# Patient Record
Sex: Male | Born: 1950
Health system: Southern US, Community
[De-identification: ages and names within clinical notes are randomized; demographics above are authoritative.]

## PROBLEM LIST (undated history)

## (undated) DIAGNOSIS — I472 Ventricular tachycardia, unspecified: Secondary | ICD-10-CM

## (undated) DIAGNOSIS — I2510 Atherosclerotic heart disease of native coronary artery without angina pectoris: Secondary | ICD-10-CM

## (undated) DIAGNOSIS — I4892 Unspecified atrial flutter: Secondary | ICD-10-CM

## (undated) DIAGNOSIS — I219 Acute myocardial infarction, unspecified: Secondary | ICD-10-CM

## (undated) DIAGNOSIS — I10 Essential (primary) hypertension: Secondary | ICD-10-CM

## (undated) DIAGNOSIS — R0602 Shortness of breath: Secondary | ICD-10-CM

## (undated) DIAGNOSIS — R911 Solitary pulmonary nodule: Secondary | ICD-10-CM

## (undated) DIAGNOSIS — K219 Gastro-esophageal reflux disease without esophagitis: Secondary | ICD-10-CM

## (undated) DIAGNOSIS — Z9289 Personal history of other medical treatment: Secondary | ICD-10-CM

## (undated) DIAGNOSIS — M21372 Foot drop, left foot: Secondary | ICD-10-CM

## (undated) DIAGNOSIS — I82622 Acute embolism and thrombosis of deep veins of left upper extremity: Secondary | ICD-10-CM

## (undated) DIAGNOSIS — I255 Ischemic cardiomyopathy: Secondary | ICD-10-CM

## (undated) DIAGNOSIS — M869 Osteomyelitis, unspecified: Secondary | ICD-10-CM

## (undated) DIAGNOSIS — G4730 Sleep apnea, unspecified: Secondary | ICD-10-CM

## (undated) DIAGNOSIS — E785 Hyperlipidemia, unspecified: Secondary | ICD-10-CM

## (undated) HISTORY — DX: Solitary pulmonary nodule: R91.1

## (undated) HISTORY — DX: Acute embolism and thrombosis of deep veins of left upper extremity: I82.622

## (undated) HISTORY — DX: Unspecified atrial flutter: I48.92

## (undated) HISTORY — DX: Hyperlipidemia, unspecified: E78.5

## (undated) HISTORY — DX: Osteomyelitis, unspecified: M86.9

## (undated) HISTORY — DX: Ischemic cardiomyopathy: I25.5

## (undated) HISTORY — DX: Foot drop, left foot: M21.372

## (undated) HISTORY — DX: Gastro-esophageal reflux disease without esophagitis: K21.9

## (undated) HISTORY — DX: Personal history of other medical treatment: Z92.89

## (undated) HISTORY — DX: Ventricular tachycardia, unspecified: I47.20

---

## 1983-06-20 HISTORY — PX: BACK SURGERY: SHX140

## 1994-03-19 HISTORY — PX: CORONARY ANGIOPLASTY WITH STENT PLACEMENT: SHX49

## 1994-10-18 HISTORY — PX: CORONARY ANGIOPLASTY WITH STENT PLACEMENT: SHX49

## 1998-09-15 ENCOUNTER — Inpatient Hospital Stay (HOSPITAL_COMMUNITY): Admission: EM | Admit: 1998-09-15 | Discharge: 1998-09-25 | Payer: Self-pay | Admitting: Emergency Medicine

## 1998-09-18 HISTORY — PX: CORONARY ANGIOPLASTY: SHX604

## 1999-05-22 ENCOUNTER — Inpatient Hospital Stay (HOSPITAL_COMMUNITY): Admission: EM | Admit: 1999-05-22 | Discharge: 1999-05-24 | Payer: Self-pay | Admitting: Emergency Medicine

## 1999-05-22 ENCOUNTER — Encounter: Payer: Self-pay | Admitting: Cardiology

## 2000-01-18 HISTORY — PX: CARDIAC CATHETERIZATION: SHX172

## 2000-02-08 ENCOUNTER — Encounter: Payer: Self-pay | Admitting: *Deleted

## 2000-02-09 ENCOUNTER — Inpatient Hospital Stay (HOSPITAL_COMMUNITY): Admission: RE | Admit: 2000-02-09 | Discharge: 2000-02-10 | Payer: Self-pay | Admitting: *Deleted

## 2002-05-01 ENCOUNTER — Other Ambulatory Visit: Admission: RE | Admit: 2002-05-01 | Discharge: 2002-05-02 | Payer: Self-pay | Admitting: Family Medicine

## 2002-12-18 HISTORY — PX: CORONARY ANGIOPLASTY WITH STENT PLACEMENT: SHX49

## 2003-01-06 ENCOUNTER — Encounter: Payer: Self-pay | Admitting: Cardiovascular Disease

## 2003-01-07 ENCOUNTER — Inpatient Hospital Stay (HOSPITAL_COMMUNITY): Admission: RE | Admit: 2003-01-07 | Discharge: 2003-01-13 | Payer: Self-pay | Admitting: Cardiovascular Disease

## 2003-01-27 ENCOUNTER — Encounter (HOSPITAL_COMMUNITY): Admission: RE | Admit: 2003-01-27 | Discharge: 2003-02-27 | Payer: Self-pay | Admitting: Cardiovascular Disease

## 2003-03-02 ENCOUNTER — Encounter (HOSPITAL_COMMUNITY): Admission: RE | Admit: 2003-03-02 | Discharge: 2003-04-01 | Payer: Self-pay | Admitting: Cardiovascular Disease

## 2003-10-06 ENCOUNTER — Encounter: Admission: RE | Admit: 2003-10-06 | Discharge: 2003-10-06 | Payer: Self-pay | Admitting: General Surgery

## 2003-10-07 ENCOUNTER — Ambulatory Visit (HOSPITAL_BASED_OUTPATIENT_CLINIC_OR_DEPARTMENT_OTHER): Admission: RE | Admit: 2003-10-07 | Discharge: 2003-10-07 | Payer: Self-pay | Admitting: General Surgery

## 2003-10-07 ENCOUNTER — Ambulatory Visit (HOSPITAL_COMMUNITY): Admission: RE | Admit: 2003-10-07 | Discharge: 2003-10-07 | Payer: Self-pay | Admitting: General Surgery

## 2003-10-08 ENCOUNTER — Encounter (INDEPENDENT_AMBULATORY_CARE_PROVIDER_SITE_OTHER): Payer: Self-pay | Admitting: Specialist

## 2004-01-15 ENCOUNTER — Ambulatory Visit (HOSPITAL_COMMUNITY): Admission: RE | Admit: 2004-01-15 | Discharge: 2004-01-15 | Payer: Self-pay | Admitting: Cardiovascular Disease

## 2004-05-16 ENCOUNTER — Ambulatory Visit (HOSPITAL_COMMUNITY): Admission: RE | Admit: 2004-05-16 | Discharge: 2004-05-17 | Payer: Self-pay | Admitting: Cardiovascular Disease

## 2005-01-17 ENCOUNTER — Ambulatory Visit (HOSPITAL_COMMUNITY): Admission: RE | Admit: 2005-01-17 | Discharge: 2005-01-18 | Payer: Self-pay | Admitting: Cardiovascular Disease

## 2005-01-17 HISTORY — PX: CORONARY ANGIOPLASTY WITH STENT PLACEMENT: SHX49

## 2006-06-18 ENCOUNTER — Inpatient Hospital Stay (HOSPITAL_COMMUNITY): Admission: EM | Admit: 2006-06-18 | Discharge: 2006-06-20 | Payer: Self-pay | Admitting: Emergency Medicine

## 2006-06-19 HISTORY — PX: CARDIAC CATHETERIZATION: SHX172

## 2007-01-10 ENCOUNTER — Encounter (INDEPENDENT_AMBULATORY_CARE_PROVIDER_SITE_OTHER): Payer: Self-pay | Admitting: *Deleted

## 2007-01-10 ENCOUNTER — Inpatient Hospital Stay (HOSPITAL_COMMUNITY): Admission: EM | Admit: 2007-01-10 | Discharge: 2007-01-11 | Payer: Self-pay | Admitting: *Deleted

## 2008-06-19 HISTORY — PX: CARDIAC CATHETERIZATION: SHX172

## 2008-11-17 HISTORY — PX: CORONARY ANGIOPLASTY WITH STENT PLACEMENT: SHX49

## 2008-11-26 ENCOUNTER — Encounter: Admission: RE | Admit: 2008-11-26 | Discharge: 2008-11-26 | Payer: Self-pay | Admitting: Cardiovascular Disease

## 2008-11-27 ENCOUNTER — Observation Stay (HOSPITAL_COMMUNITY): Admission: AD | Admit: 2008-11-27 | Discharge: 2008-11-28 | Payer: Self-pay | Admitting: Cardiology

## 2009-12-14 ENCOUNTER — Encounter: Admission: RE | Admit: 2009-12-14 | Discharge: 2009-12-15 | Payer: Self-pay | Admitting: Family Medicine

## 2010-07-10 ENCOUNTER — Encounter: Payer: Self-pay | Admitting: Cardiovascular Disease

## 2010-07-29 HISTORY — PX: TRANSTHORACIC ECHOCARDIOGRAM: SHX275

## 2010-09-26 LAB — BASIC METABOLIC PANEL
BUN: 13 mg/dL (ref 6–23)
Chloride: 105 mEq/L (ref 96–112)
Creatinine, Ser: 1.08 mg/dL (ref 0.4–1.5)
Glucose, Bld: 120 mg/dL — ABNORMAL HIGH (ref 70–99)
Sodium: 141 mEq/L (ref 135–145)

## 2010-09-26 LAB — LIPID PANEL
Cholesterol: 89 mg/dL (ref 0–200)
LDL Cholesterol: 56 mg/dL (ref 0–99)
VLDL: 15 mg/dL (ref 0–40)

## 2010-09-26 LAB — CBC: Platelets: 169 10*3/uL (ref 150–400)

## 2010-09-26 LAB — CARDIAC PANEL(CRET KIN+CKTOT+MB+TROPI)
Relative Index: 5.8 — ABNORMAL HIGH (ref 0.0–2.5)
Total CK: 164 U/L (ref 7–232)
Troponin I: 0.02 ng/mL (ref 0.00–0.06)

## 2010-11-01 NOTE — Discharge Summary (Signed)
John [name], John [name] NO.:  [id number]   MEDICAL RECORD NO.:  [id number]          PATIENT TYPE:  INP   LOCATION:  2502                         FACILITY:  [facility]   PHYSICIAN:  Cristy Hilts. Jacinto Halim, MD       DATE OF BIRTH:  March 22, 1951   DATE OF ADMISSION:  11/27/2008  DATE OF DISCHARGE:  11/28/2008                               DISCHARGE SUMMARY   DISCHARGE DIAGNOSES:  1. The patient has had increasing angina.  2. High risk Cardiolite study done, November 24, 2008.  3. Coronary artery disease with new driver to the mid right coronary      artery, distal to previously placed Cypher stent.  4. Previous proximal right coronary artery stent with Cypher stent in      2006, another stent mid RCA, as well as stent to the circumflex.  5. Left ventricular dysfunction, EF 40-45%.  6. Tobacco abuse.  7. History of subacute stent thrombosis x2 in the past.   DISCHARGE CONDITION:  Improved.   DISCHARGE MEDICATIONS:  1. Norvasc 5 mg daily.  2. Toprol-XL 50 mg daily.  3. Benazepril 20 mg daily.  4. Enteric-coated aspirin new dose of 325 mg daily.  5. Singulair 10 mg daily.  6. Crestor 20 mg daily.  7. Zetia 10 mg daily.  8. BuSpar 10 mg daily.  9. Nexium 40 mg twice a day.  10.Nitroglycerin 0.4 mg sublingual as needed.  11.Effient 10 mg one daily to replace the Plavix for 4 weeks.  12.No Plavix.   DISCHARGE INSTRUCTIONS:  1. May return to work, December 02, 2008.  2. Wash cath site with soap and water.  Call if any bleeding,      swelling, or drainage.  3. Increase activity slowly.  4. May shower.  No lifting for 3 days.  No driving for 3 days.  5. Follow up with Dr. Alanda Amass in 2 weeks, office will call with date      and time.   HISTORY OF PRESENT ILLNESS:  A 60 year old white married male with  tobacco history and history of coronary disease with history of stents  to the RCA and the circumflex in the past, begin having increasing  episodes of chest pain, underwent Cardiolite  study, November 24, 2008, which  was interpreted as high risk.  He was arranged for a cardiac  catheterization and possible intervention, which she did undergo as  elective procedure at [facility], November 27, 2008.  A new stent was placed, a  driver stent.   The patient was placed in the 2500 units post procedure, tolerated it  the night without problems.  The next morning, was stable.  Hemoglobin  14.9, hematocrit 43.5, platelets 167, and WBC 7.5.  Potassium 3.8,  creatinine 1.08.   PHYSICAL EXAMINATION:  VITAL SIGNS: Blood pressure 113/72, pulse 59,  temperature 97.8.  CK-MB and troponin were normal.  HEART:  Regular rate and rhythm.  LUNGS:  Clear.  Groin cath site without hematoma.   He was ambulated with cardiac rehab and did well and was discharged  home.  He will follow up as  an outpatient with Dr. Alanda Amass.      Darcella Gasman. [place], N.P.      Cristy Hilts. Jacinto Halim, MD     LRI/MEDQ  D:  11/28/2008  T:  11/29/2008  Job:  [id number]   cc:   Gerlene Burdock A. Alanda Amass, M.D.  Ernestina Penna, M.D.  [facility]

## 2010-11-01 NOTE — Discharge Summary (Signed)
NAMECARLOS, HEBER NO.:  [id number]   MEDICAL RECORD NO.:  [id number]          PATIENT TYPE:  INP   LOCATION:  2014                         FACILITY:  [facility]   PHYSICIAN:  Marcellus Scott, MD     DATE OF BIRTH:  1951/03/06   DATE OF ADMISSION:  01/10/2007  DATE OF DISCHARGE:  01/11/2007                               DISCHARGE SUMMARY   PRIMARY MEDICAL DOCTOR:  Dr. Vernon Prey of [facility].   CARDIOLOGIST:  Dr. Susa Griffins of [facility].   DISCHARGE DIAGNOSES:  1. Atypical chest pain, myocardial infarction ruled out.  2. Ischemic cardiomyopathy.  3. Hypertension.  4. Bilateral Pulmonary nodules.  5. Right upper pole 2 cm renal cyst.  6. Tobacco abuse.  7. Dyslipidemia.  8. Gastroesophageal reflux disease.  9. Fatty liver.   DISCHARGE MEDICATIONS:  Essentially the same medications that the  patient was taking at home and Imdur 30 mg p.o. daily was newly added:  1. Norvasc 5 mg p.o. daily.  2. Toprol-XL 50 mg p.o. daily.  3. Lotensin 20 mg p.o. daily.  4. Enteric-coated aspirin 81 mg p.o. daily.  5. Singulair 10mg  p.o. daily.  6. Plavix 75 mg p.o. daily.  7. Crestor 20 mg p.o. daily.  8. Nexium 40 mg p.o. daily.  9. Zetia 10 mg p.o. daily.  10.BuSpar 10 mg p.o. daily.  11.Imdur 30 mg p.o. daily.  12.Tylenol 650 mg p.o. q.4-6 h. p.r.n.   PROCEDURES:  1. Portable chest x-ray on July 23.  Impression:  Stable pulmonary      vascular congestion without edema.  Question interval cardiac      enlargement versus artifact.  2. Ultrasound of the abdomen.  Impression:  (a) Fatty infiltration,      (b) right upper pole 2-cm renal cyst, (c) normal gallbladder, (d)      no acute findings by ultrasound.  3. CT angiogram of the chest on July 23.  Impression:  No evidence of      pulmonary embolism.  Mild bibasilar atelectasis.  Small hiatal      hernia.  Nonspecific bilaterally pulmonary nodules.  Does the      patient  have a history of malignancy?  Small size of these lesions      make it difficult to assess by PET/CT.  Recommend short-term      followup CT of chest in 4-6 months to determine stability.   PERTINENT LABORATORY RESULTS:  Basic metabolic panel normal with a BUN  of 9 and creatinine of 0.98.  CBC is normal with hemoglobin 15.5,  hematocrit 45.6.  Troponin x3 negative.  CK is mildly elevated at 117,  118, 134 in descending order; CK-MB 8, 7.9, 6.6 in descending order;  relative index 6.8, 6.7, 4.9 in that order.  A1c of 6.3.  Lipids of  cholesterol 84, triglycerides 73, HDL 28, LDL 41, VLDL of 15, lipase 37.  Hepatic panel normal.   CONSULTATIONS:  Cardiology from [facility], Dr.  Tresa Endo.   HOSPITAL COURSE AND PATIENT DISPOSITION:  For details of the initial  part of the admission, please refer to the history and physical note  done by Dr. Roxan Hockey.  In summary, Mr. Arney is a 60 year old male  with extensive past medical history of coronary artery disease, status  post stents with recent cardiac catheterization revealing patent stents.  He presented with a 1-week history of pain across the lower chest and  back.  He was therefore admitted to rule out an acute coronary syndrome.  The patient was admitted to a telemetry bed.  His cardiac enzymes were  cycled and not indicative of an MI. He was initially placed on  nitroglycerin drip and continued on his home cardiac medications.  Although his chest pain was atypical, given his previous extensive  cardiac history Cardiology was consulted.  They did not think repeat  cath was warranted.  His nitroglycerin drip was stopped and he was  switched to oral Imdur.  With these measures, the patient has done quite  well with resolution of his chest pain.  The patient has been cleared by  Cardiology for discharge on medications as indicated above and to follow  up with them as an outpatient for an outpatient stress test.  The   patient has been instructed to seek immediate medical attention for any  deterioration in his condition.  Incidental findings of pulmonary  nodules, renal cysts and fatty liver to be followed and worked up as an  outpatient as deemed necessary.  The patient has been counseled  regarding tobacco cessation.      Marcellus Scott, MD  Electronically Signed     AH/MEDQ  D:  01/11/2007  T:  01/11/2007  Job:  [id number]   cc:   Ernestina Penna, M.D.  Richard A. Alanda Amass, M.D.  Nicki Guadalajara, M.D.

## 2010-11-01 NOTE — H&P (Signed)
John [name], John [name] NO.:  [id number]   MEDICAL RECORD NO.:  [id number]          PATIENT TYPE:  INP   LOCATION:  1823                         FACILITY:  [facility]   PHYSICIAN:  Michaelyn Barter, M.D. DATE OF BIRTH:  [birth year]   DATE OF ADMISSION:  01/09/2007  DATE OF DISCHARGE:                              HISTORY & PHYSICAL   CARDIOLOGIST:  Gerlene Burdock A. Alanda Amass, M.D.   CHIEF COMPLAINT:  Lower chest and back pain.   HISTORY OF PRESENT ILLNESS:  Mr. Leece is a 60 year old gentleman  with a past medical history of coronary artery disease and stent  placement who states that for at least one week, he had been  experiencing different types of pain.  His lower chest,  upper abdomen,  and his back have been causing him pain.  He is very vague with regards  to whether or not his upper abdominal area versus his lower chest area  bothers him the most.  He states that he has been having some lower  chest pain on a daily basis for the past week.  It reaches 8/10 in peak  intensity.  There are no aggravating or alleviating factors.  He also  has had some nausea but no vomiting.  He is experiencing chills but no  fever.  He states that he has never had similar type of lower  chest/abdominal pain before.  His back pain is also described as  constant, sharp, in the upper region of his back just below the shoulder  blades.  He indicates that he took sublingual nitroglycerin yesterday  morning. This provided some symptom relief.  He thought that his heart  may have been involved with regards to his current symptoms.  He denies  having any history of back injury.  He called his cardiologist's, Dr.  Alanda Amass, office earlier in the day and spoke with one of the nurses.  She indicated that Dr. Tresa Endo had told the patient that his symptoms  sounded classic for a heart related pain.  He was, therefore, referred  to the hospital for further evaluation.  Currently the patient is  attached to IV nitroglycerin and he indicates that he is completely pain  free.   PAST MEDICAL HISTORY:  1. Coronary artery disease.  The patient underwent a cardiac      catheterization on June 20, 2006.  It appears the patient has      stents present that travel from the most proximal portion, but do      not appear to cover the ostium and they extend all the way down      past the PDA.  During the cardiac catheterization, it was revealed      that all the stents were widely patent with only mild irregularity.      The patient also was discovered to have had ischemic myopathy with      a 40-45% EF with inferior posterior and basilar wall motion      abnormality.  2. Hypertension.  3. Dyslipidemia.  4. History of anxiety.  5. GERD.  6. Unstable angina.  7.  Myocardial infarction, September 15, 1998.  It appears, according to      the E-chart, that the patient may have had at least three Mis in      the past and has had multiple PTCAs with stentings.   ALLERGIES:  No known drug allergies.   HOME MEDICATIONS:  1. Benazepril 20 mg p.o. daily.  2. BuSpar 10 mg daily.  3. Crestor.  4. Metoprolol tartrate 25 mg daily.  5. Nexium 40 mg daily.  6. Plavix 75 mg daily.  7. Singulair 10 mg daily.  8. Zetia 10 mg daily.  9. Norvasc 5 mg daily.  10.Sublingual nitroglycerin.   SOCIAL HISTORY:  Cigarettes.  The patient indicates that he has been  smoking since the age of 18.  He still currently smokes one pack of  cigarettes a day.  Alcohol.  The patient stopped approximately 20 years  ago.  Initially the patient stated that he stopped 20 years ago.  He  later stated that he still occasionally a beer.   FAMILY HISTORY:  Mother died from bone cancer.  Father died from MI at  the age of 9.   REVIEW OF SYSTEMS:  As per HPI.   PHYSICAL EXAMINATION:  VITAL SIGNS: Temperature 98.2, blood pressure  132/83, heart rate 73, respirations 18, oxygen saturation 97% on room  air.  HEENT:   Atraumatic, normocephalic.  Anicteric.  Extraocular movements  are intact.  Oral mucosa is pink.  No thrush. No exudates.  NECK:  No JVD.  Supple.  No lymphadenopathy.  No thyromegaly.  CARDIAC: S1 and S2 present.  Regular rate and rhythm.  ABDOMEN:  Flat. Soft. Nondistended, nontender.  Positive bowel sounds in  all four quadrants.  No masses.  EXTREMITIES:  No leg edema.  NEUROLOGIC:  The patient is alert and oriented x3.  MUSCULOSKELETAL:  Bilateral 5/5 upper and lower extremity strength.   CT scan of the chest reveals nonspecific bilateral pulmonary nodules.  It is recommended by the radiologist that the patient have a followup CT  scan within the next four to six months.  Ultrasound of the abdomen  reveals no acute findings.  Fatty liver is noted.   LABORATORY DATA:  CK-MB, TLC 3.3, troponin-I TLC less than 0.05.  WBC  10.9, hemoglobin 14.8, hematocrit 44.5, platelets 206,000.  Lipase 37,  bilirubin total 0.6, bilirubin direct 0.1, indirect bilirubin 0.5. Alk  phos 85, SGOT 21, SGPT 27, total protein 6.9, albumin 3.6.  PT is 13,  INR 1.  CK-MB TLC 5.1, troponin-I TLC less than 0.05.  Creatinine 1.  ABG: PH 7.347, pCO2 50.4, bicarb 27.7, hemoglobin 16.7, hematocrit 49.  Sodium 143, potassium 3.8, chloride 106, glucose 95, BUN 9.  The EKG  done January 09, 2007 was similar to that done June 20, 2006.  The only  difference is it looks like the patient may have a PVC that is evident  in leads V2 and V3.  Otherwise EKGs have a very similar appearance.   ASSESSMENT/PLAN:  1. Lower chest/upper abdominal pain.  The etiology of this is      questionable.  Given the patient's history of coronary artery      disease and multiple myocardial infarctions, as well as him      indicating that he had derived significant amount of relief from      nitroglycerin, one has to be concerned about the possibility of      cardiac factor playing a role.  Will, therefore, rule  the patient      out for  cardiac event via following his cardiac markers x3 eight      hours apart.  IV nitroglycerin has been initiated in the emergency      department.  Will continue this at a low dose.  Will provide p.r.n.      morphine and aspirin.  Will also consider consultation with      cardiology in the morning.  2. Hypertension.  This is stable.  3. Back pain.  The relationship to the patient's upper abdomen and      chest pain is questionable.  The patient indicated that he did have      an MRI completed earlier in the day, the results of which are not      currently available.  Will, therefore, have to follow this up.  4. Questionable lung nodules on CT scan.  Will bring this to the      patient's attention to assure that he does have a repeat followup      CT scan done in four to six months.  5. Continued cigarette smoking.  Will recommend smoking cessation.  6. History of dyslipidemia.  Will resume the patient's prior      hyperlipidemia medication.  7. History of coronary artery disease with multiple stents, status      post myocardial infarction.  Will consider consultation with      cardiology.  8. GI prophylaxis with Protonix.  DVT prophylaxis with Lovenox.      Michaelyn Barter, M.D.  Electronically Signed     OR/MEDQ  D:  01/10/2007  T:  01/10/2007  Job:  [id number]

## 2010-11-01 NOTE — Cardiovascular Report (Signed)
NAMESOU, NOHR NO.:  [id number]   MEDICAL RECORD NO.:  [id number]          PATIENT TYPE:  INP   LOCATION:  2502                         FACILITY:  [facility]   PHYSICIAN:  Cristy Hilts. Jacinto Halim, MD       DATE OF BIRTH:  June 27, 1950   DATE OF PROCEDURE:  11/27/2008  DATE OF DISCHARGE:                            CARDIAC CATHETERIZATION   PROCEDURE PERFORMED:  1. Left ventriculography.  2. Selective right and left coronary arteriography.  3. PTCA and direct stenting of the mid RCA for a 80% stenosis just      distal to the previously placed stent.  4. Left femoral arteriography and left femoral arterial access with      StarClose.   INDICATION:  John [name] is a very complex 60 year old gentleman with  known coronary artery disease, obesity, hypertension, hyperlipidemia,  who has had extensive angioplasties and multiple stents placed in his  mid and also distal right coronary artery, which is a dominant vessel.  He has been having angina pectoris.  It will be the high clinical  suspicion for in-stent restenosis.  He was referred for cardiac  catheterization.   HEMODYNAMIC DATA:  The left ventricular pressure was 132/60 with end-  diastolic pressure of 22 mmHg.  Aortic pressure was 125/78 with a mean  of 97 mmHg.  There is no significant pressure gradient across the aortic  valve.   ANGIOGRAPHIC DATA:  Left ventricle:  The left ventricular systolic  function was mild to moderately depressed with ejection fraction of 40-  45% with inferolateral akinesis.   Right coronary artery:  Right coronary artery is a large-caliber vessel  and a dominant vessel.  The previously placed stent in the proximal to  mid segment and also the distal segment patent with mild 20-30% luminal  irregularity.  Just distal to the previously placed 2 drug-eluting  stent, there is a napkin ring like 80% stenoses.  Distal right coronary  artery stents that extends into the PLV branch are patent  with mild 20-  30% luminal irregularity.   The right coronary artery has a 3.5 x 13 and a 3.5 x 18-mm Cypher stents  placed in the proximal and mid segment placed in 2006 and distal PDA  branches have multiple stents.  The sizes of which I am not sure at this  time.   Left main:  Left main coronary artery is a moderate-caliber vessel with  mild calcification.   Circumflex:  Circumflex coronary artery is continues as large obtuse  marginal I branch.  There is 30% stenosis in the mid segment.  There is  questionable stent in the mid segment of the obtuse marginal.  However,  there is mild luminal irregularity.   LAD:  LAD is a large-caliber vessel into the proximal segment with mild  diffuse luminal irregularity and gives origin to large diagonal I.   INTERVENTION DATA:  Successful PTCA and direct stenting of the mid RCA  with implantation of a 4.0 x 12-mm driver stent, which was deployed at  10 atmospheric pressure.  Gently, it was pulled back inside of  the stent  struts and a 14-16 atmospheric pressure balloon inflation was performed  for about 40 seconds.  Having performed this, 0% residual waste was  noted.  Post balloon angioplasty angiography with excellent result with  brisk TIMI III flow.   RECOMMENDATIONS:  The patient has had subacute stent thrombosis x2 in  the past.  He was actually bridged with Ticlid thinking that he probably  has Plavix resistance and eventually switched over to Plavix once his  acute phase was done.  Hence, we will start him on Effient for at least  a period of 4 weeks then he can be switched back to Plavix.  A total of  135 mL of contrast was utilized for diagnostic and interventional  procedure.   TECHNIQUE OF THE PROCEDURE:  Usual sterile precautions using a 6-French  left femoral arterial access, a 6-French multipurpose B2 catheter was  advanced into ascending aorta and then into the left ventricle.  Left  ventriculography was performed both in  LAO and RAO projection.  Catheter  pulled into the ascending aorta.  Right coronary was selective engaged,  angiography was performed.  Then, left main coronary artery was  selectively engaged and angiography was performed.  Then, the catheter  was pulled out of the body.   INTERVENTION DATA:  Successful PTCA and direct stenting of the mid RCA  with the implantation of a 4.0 x 12-mm driver, nondrug-eluting stent,  which was deployed at high pressure.  This was performed using heparin  for anticoagulation and maintaining ACT greater than 200.  The ACT was 254 at stent deployment.  Direct stenting was performed  after using a 6-French JR-4 guide catheter, which was utilized in the  right coronary artery and Asahi Prowater guidewire was utilized to cross  the right coronary artery and direct stenting was performed.  The same  stent balloon was utilized to perform post dilatation at high pressure  keeping it within the stent struts.  Having performed this, excellent  angiographic results were obtained.  The guidewire was withdrawn.  Angiography repeated.  Guide catheter disengaged and pulled out of body.   Left femoral arteriography was performed through the arterial access.  Sheath and the access closed with StarClose with excellent hemostasis.   The patient tolerated the procedure well.  No immediate complication  noted.      Cristy Hilts. Jacinto Halim, MD  Electronically Signed     JRG/MEDQ  D:  11/27/2008  T:  11/28/2008  Job:  [id number]   cc:   Gerlene Burdock A. Alanda Amass, M.D.  Ernestina Penna, M.D.

## 2010-11-04 NOTE — Consult Note (Signed)
John [name], John [name] [name] NO.:  [id number]   MEDICAL RECORD NO.:  [id number]          PATIENT TYPE:  OIB   LOCATION:  6524                         FACILITY:  [facility]   PHYSICIAN:  Griffith Citron, M.D.DATE OF BIRTH:  Jul 24, 1950   DATE OF CONSULTATION:  05/16/2004  DATE OF DISCHARGE:                                   CONSULTATION   REFERRING PHYSICIAN:  Richard A. Alanda Amass, M.D.   REASON FOR CONSULTATION:  John [name] John [name] [name] is a 60 year old white male who with  known coronary artery disease, having had multiple percutaneous cardiology  interventions, readmitted today following cardiac catheterization.  His  current admission was precipitated by an episode of chest pain which he  experienced one week ago.  He took nitroglycerin with prompt relief.  His  pain was left precordial radiating to the left shoulder.  He feels that he  can distinguish this from his GI symptoms.  On angiogram today there was no  significant progression of his coronary artery disease, nor was there  significant stenosis of the multiple stents which are in situ.   From a GI standpoint, the patient has had long-standing symptoms over the  past 20 to 30 years.  He initially used H2 blockers and over the past  several years had been switched to PPI therapy, including Protonix,  Prevacid, Prilosec, and Nexium.  His current regimen is Nexium 40 mg p.o.  daily.  Despite this, he has almost daily episodes of pyrosis which he  describes in quite classic terms of burning discomfort arising from the  epigastrium into the chest and base of the throat.  It is exacerbated  typically postprandially and when laying down.  Accompanied by night-time  regurgitation, at times awakening with choking and coughing fits.  He denies  odynophagia, dysphagia, or hematemesis.  He does have dyspeptic symptoms  with epigastric pressure-type discomfort, abdominal bloating, belching, and  a vague pressure-type discomfort  radiating across the upper abdomen.  This  worsens if he misses a meal.   The patient has not undergone prior endoscopy.  He did have an abdominal  ultrasound this past summer to rule out gallbladder disease at [facility], reportedly negative.  He has gained 15 pounds over the past three  months, but he does not feel that this has exacerbated his GI symptoms in  any way.  His risk factors are minimal, taking one aspirin 325 mg tablet  daily.  No use of NSAIDS or ulcerogenic over-the-counter medications.  He  smokes minimally and is currently trying to stop.  He drinks rarely.  There  is a significant family history, a father with peptic disease.   PAST MEDICAL HISTORY:  1.  Chronic obstructive airway disease/chronic tobacco abuse.  2.  Coronary artery disease.  3.  Hypertension.  4.  Hyperlipidemia.  5.  Panic attacks.  6.  Status post lumbosacral spine surgery in 1985.   CURRENT MEDICATIONS:  1.  Norvasc 5 mg daily.  2.  Toprol XL 50 mg daily.  3.  Lotensin 20 mg daily.  4.  Aspirin 325 mg daily.  5.  Plavix daily.  6.  Crestor 20 mg daily.  7.  Nexium 40 mg daily.   ALLERGIES:  NIASPAN.   SOCIAL HISTORY:  Father of three children.  Smokes approximately five  cigarettes per day.  Alcohol is rare beer usually during the summer.  Employment:  Civil Service fast streamer for [employer].  Job entails traveling a  minimum of three days a week.   FAMILY HISTORY:  Significant for father with peptic disease.  No family  history of gallbladder disease or colorectal neoplasia.   REVIEW OF SYSTEMS:  Per HPI.  Otherwise negative.   PHYSICAL EXAMINATION:  GENERAL:  A healthy-appearing white male resting  comfortably in bed.  Alert and oriented x3.  _________affect.  Normal mood.  Excellent historian.  HEENT:  Anicteric sclerae, pink conjunctivae.  EOMI.  PERRL.  No  oropharyngeal lesions without abnormality of the tongue, lips, or gums.  CHEST:  Clear to auscultation with  slightly decreased breath sounds  throughout.  No adventitious sounds.  CARDIAC:  Regular rhythm.  No gallop, no murmur, no heave, no lift.  ABDOMEN:  Overweight, soft, nontender, nondistended, bowel sounds are  present throughout.  No borborygmi, no bruit, or splash.  No caput, venous  prominence, or fluid wave detected.  No firmness, no mass.  RECTAL:  Not performed.  EXTREMITIES:  No cyanosis, clubbing, or edema.  Distal radial pulses full  and bilaterally symmetric.  NEUROLOGIC:  Without focal deficit.   IMPRESSION:  1.  Gastroesophageal reflux disease.  His symptoms are long-standing and      rather classic in character.  The only atypical feature is the failure      of these symptoms to respond to acid-suppressive therapy either with H2      blockers or proton pump inhibitors.  Because of the difficulty in      discerning the etiology of his current episode of chest pain, and      because of the refractory nature of his reflux symptoms, endoscopy is      clearly warranted to further evaluate possible peptic etiology.  2.  Dyspepsia.  Separate from the patient's reflux symptoms he experiences      epigastric pressure-type discomfort, associated belching, abdominal      bloating.  This occurs postprandially.  His only risk factors are one      aspirin tablet daily and a family history of a father with peptic ulcer      disease.  Endoscopy will help to sort out any upper gastrointestinal      pathology here as well.  3.  Coronary artery disease.  We will establish and closely follow.  No      evidence of significant disease progression.  The patient appears to be      able to discern cardiac from gastrointestinal etiology in his symptoms.  4.  Colorectal neoplasia surveillance.  Screening colonoscopy is yet to be      accomplished.  This will be recommended when the patient is stable on an      outpatient basis.   RECOMMENDATIONS: 1.  Abdominal ultrasound tomorrow morning.  2.   Panendoscopy at 3 p.m. tomorrow.  3.  Colorectal neoplasia surveillance with screening colonoscopy to be      scheduled on an outpatient basis.       JRM/MEDQ  D:  05/16/2004  T:  05/17/2004  Job:  [id number]   cc:   [facility]   Richard A. Alanda Amass, M.D.  [phone]  [address]  [place]  [place] [address]  Fax: [phone]

## 2010-11-04 NOTE — Op Note (Signed)
NAME:  GLENVILLE, ESPINA                        ACCOUNT NO.:  [id number]   MEDICAL RECORD NO.:  [id number]                   PATIENT TYPE:  AMB   LOCATION:  DSC                                  FACILITY:  [facility]   PHYSICIAN:  Leonie Man, M.D.                DATE OF BIRTH:  [birth year]   DATE OF PROCEDURE:  10/07/2003  DATE OF DISCHARGE:                                 OPERATIVE REPORT   PREOPERATIVE DIAGNOSIS:  Large sebaceous cyst of back.   POSTOPERATIVE DIAGNOSIS:  Large sebaceous cyst of back.   PROCEDURE:  Excision of sebaceous cyst of back.   SURGEON:  Leonie Man, M.D.   ASSISTANT:  Nurse.   ANESTHESIA:  MAC; I used 0.5% Marcaine with epinephrine 1:200,000.   INDICATIONS FOR PROCEDURE:  Note, the patient is a 60 year old man with an  enlarging sebaceous cyst of the back which he has asked to be removed  because it is causing some pain and discomfort at this point.  He is brought  to the operating room after the risks and potential benefits of surgery have  been discussed.  All questions have been answered and consent obtained.   DESCRIPTION OF PROCEDURE:  With the patient positioned in the left lateral  recumbent position, the cyst which was located just to the right of the  spinous processes in the mid thoracic area was prepped and draped to be  included in the sterile operative field.  I infiltrated around this area  with 0.5% Marcaine with epinephrine 1:200,000.  I then made an elliptical  incision over the mass and deepened this through the skin down to the  capsule of the mass carrying the dissection laterally until the entire  capsule could be visualized.  This was carried down both laterally up to the  right and to the left.  The sebaceous cyst was dissected free from the  surrounding subcutaneous tissues, removed in its entirety and forwarded for  pathologic evaluation.  The subcutaneous tissues were undermined for  approximately 2 cm on either side so as to  allow tension free skin closure.  Sponge, instrument and sharp counts were verified.  All areas of dissection  were checked for hemostasis and additional bleeding points treated with  electrocautery. The subcutaneous tissues were reapproximated with  interrupted 0 Vicryl sutures. The skin was closed with running 4-0 Monocryl  suture and then reinforced with Steri-Strips. A sterile dressing was applied  and the anesthetic was reversed.  The patient was removed from the operating  room to the recovery room in stable condition. He tolerated the procedure  well.                                               Leonie Man, M.D.    PB/MEDQ  D:  10/07/2003  T:  10/08/2003  Job:  [id number]

## 2011-04-03 LAB — CK TOTAL AND CKMB (NOT AT ARMC)
CK, MB: 6.6 — ABNORMAL HIGH
CK, MB: 7.9 — ABNORMAL HIGH
CK, MB: 8 — ABNORMAL HIGH
Relative Index: 4.9 — ABNORMAL HIGH
Relative Index: 6.7 — ABNORMAL HIGH
Relative Index: 6.8 — ABNORMAL HIGH
Total CK: 117

## 2011-04-03 LAB — HEPATIC FUNCTION PANEL
ALT: 27
Albumin: 3.6
Bilirubin, Direct: 0.1
Indirect Bilirubin: 0.5

## 2011-04-03 LAB — POCT I-STAT CREATININE
Creatinine, Ser: 1
Operator id: 285841

## 2011-04-03 LAB — I-STAT 8, (EC8 V) (CONVERTED LAB)
Glucose, Bld: 95
HCT: 49
Hemoglobin: 16.7
Operator id: 285841
Sodium: 143

## 2011-04-03 LAB — POCT CARDIAC MARKERS
CKMB, poc: 3.3
Myoglobin, poc: 173
Operator id: 285841
Operator id: 285841
Troponin i, poc: <0.05

## 2011-04-03 LAB — LIPID PANEL
Total CHOL/HDL Ratio: 3
VLDL: 15

## 2011-04-03 LAB — CBC
HCT: 44.5
HCT: 45.6
Hemoglobin: 14.8
MCHC: 34
Platelets: 203
RDW: 12.7
WBC: 10.3
WBC: 10.9 — ABNORMAL HIGH

## 2011-04-03 LAB — DIFFERENTIAL
Basophils Relative: 1
Eosinophils Absolute: 0.1
Monocytes Relative: 4
Neutro Abs: 8.6 — ABNORMAL HIGH
Neutrophils Relative %: 79 — ABNORMAL HIGH

## 2011-04-03 LAB — BASIC METABOLIC PANEL
BUN: 9
Calcium: 9.1
Chloride: 105
GFR calc non Af Amer: >60
Glucose, Bld: 97
Potassium: 3.8
Sodium: 141

## 2011-04-03 LAB — LIPASE, BLOOD: Lipase: 37

## 2011-04-03 LAB — TROPONIN I: Troponin I: 0.01

## 2011-04-04 DIAGNOSIS — Z9289 Personal history of other medical treatment: Secondary | ICD-10-CM

## 2011-04-04 HISTORY — DX: Personal history of other medical treatment: Z92.89

## 2011-08-07 ENCOUNTER — Other Ambulatory Visit (HOSPITAL_COMMUNITY): Payer: Self-pay | Admitting: Cardiovascular Disease

## 2011-08-10 ENCOUNTER — Ambulatory Visit (HOSPITAL_COMMUNITY)
Admission: RE | Admit: 2011-08-10 | Discharge: 2011-08-10 | Disposition: A | Discharge status: Home / Self Care | Payer: 59 | Source: Ambulatory Visit | Attending: Cardiovascular Disease | Admitting: Cardiovascular Disease

## 2011-08-10 ENCOUNTER — Other Ambulatory Visit (HOSPITAL_COMMUNITY): Payer: Self-pay | Admitting: Cardiovascular Disease

## 2011-08-10 DIAGNOSIS — K7689 Other specified diseases of liver: Secondary | ICD-10-CM | POA: Insufficient documentation

## 2011-08-10 DIAGNOSIS — R110 Nausea: Secondary | ICD-10-CM | POA: Insufficient documentation

## 2011-08-10 DIAGNOSIS — R109 Unspecified abdominal pain: Secondary | ICD-10-CM | POA: Insufficient documentation

## 2011-08-15 ENCOUNTER — Encounter (INDEPENDENT_AMBULATORY_CARE_PROVIDER_SITE_OTHER): Payer: Self-pay | Admitting: *Deleted

## 2011-08-29 ENCOUNTER — Ambulatory Visit (INDEPENDENT_AMBULATORY_CARE_PROVIDER_SITE_OTHER): Payer: 59 | Admitting: Internal Medicine

## 2012-02-26 ENCOUNTER — Encounter (HOSPITAL_COMMUNITY): Payer: Self-pay | Admitting: *Deleted

## 2012-02-26 ENCOUNTER — Inpatient Hospital Stay (HOSPITAL_COMMUNITY): Payer: 59

## 2012-02-26 ENCOUNTER — Inpatient Hospital Stay (HOSPITAL_COMMUNITY)
Admission: AD | Admit: 2012-02-26 | Discharge: 2012-02-27 | DRG: 287 | Disposition: A | Discharge status: Home / Self Care | Payer: 59 | Source: Ambulatory Visit | Attending: Cardiovascular Disease | Admitting: Cardiovascular Disease

## 2012-02-26 DIAGNOSIS — Z7902 Long term (current) use of antithrombotics/antiplatelets: Secondary | ICD-10-CM

## 2012-02-26 DIAGNOSIS — Z794 Long term (current) use of insulin: Secondary | ICD-10-CM

## 2012-02-26 DIAGNOSIS — M216X9 Other acquired deformities of unspecified foot: Secondary | ICD-10-CM | POA: Diagnosis present

## 2012-02-26 DIAGNOSIS — I498 Other specified cardiac arrhythmias: Secondary | ICD-10-CM | POA: Diagnosis present

## 2012-02-26 DIAGNOSIS — G4730 Sleep apnea, unspecified: Secondary | ICD-10-CM | POA: Diagnosis present

## 2012-02-26 DIAGNOSIS — G4733 Obstructive sleep apnea (adult) (pediatric): Secondary | ICD-10-CM | POA: Diagnosis present

## 2012-02-26 DIAGNOSIS — E119 Type 2 diabetes mellitus without complications: Secondary | ICD-10-CM | POA: Diagnosis present

## 2012-02-26 DIAGNOSIS — Z9861 Coronary angioplasty status: Secondary | ICD-10-CM

## 2012-02-26 DIAGNOSIS — I200 Unstable angina: Secondary | ICD-10-CM | POA: Diagnosis present

## 2012-02-26 DIAGNOSIS — IMO0001 Reserved for inherently not codable concepts without codable children: Secondary | ICD-10-CM | POA: Diagnosis present

## 2012-02-26 DIAGNOSIS — E785 Hyperlipidemia, unspecified: Secondary | ICD-10-CM | POA: Diagnosis present

## 2012-02-26 DIAGNOSIS — F17200 Nicotine dependence, unspecified, uncomplicated: Secondary | ICD-10-CM | POA: Diagnosis present

## 2012-02-26 DIAGNOSIS — I2510 Atherosclerotic heart disease of native coronary artery without angina pectoris: Principal | ICD-10-CM | POA: Diagnosis present

## 2012-02-26 DIAGNOSIS — K219 Gastro-esophageal reflux disease without esophagitis: Secondary | ICD-10-CM | POA: Diagnosis present

## 2012-02-26 DIAGNOSIS — I10 Essential (primary) hypertension: Secondary | ICD-10-CM | POA: Diagnosis present

## 2012-02-26 DIAGNOSIS — E1143 Type 2 diabetes mellitus with diabetic autonomic (poly)neuropathy: Secondary | ICD-10-CM | POA: Diagnosis present

## 2012-02-26 DIAGNOSIS — J4489 Other specified chronic obstructive pulmonary disease: Secondary | ICD-10-CM | POA: Diagnosis present

## 2012-02-26 DIAGNOSIS — I252 Old myocardial infarction: Secondary | ICD-10-CM

## 2012-02-26 DIAGNOSIS — J449 Chronic obstructive pulmonary disease, unspecified: Secondary | ICD-10-CM | POA: Diagnosis present

## 2012-02-26 DIAGNOSIS — Z7982 Long term (current) use of aspirin: Secondary | ICD-10-CM

## 2012-02-26 HISTORY — DX: Acute myocardial infarction, unspecified: I21.9

## 2012-02-26 HISTORY — DX: Shortness of breath: R06.02

## 2012-02-26 HISTORY — DX: Essential (primary) hypertension: I10

## 2012-02-26 HISTORY — DX: Atherosclerotic heart disease of native coronary artery without angina pectoris: I25.10

## 2012-02-26 HISTORY — DX: Sleep apnea, unspecified: G47.30

## 2012-02-26 LAB — CBC
HCT: 46.4 % (ref 39.0–52.0)
Hemoglobin: 15.5 g/dL (ref 13.0–17.0)
MCH: 30.2 pg (ref 26.0–34.0)
MCHC: 33.4 g/dL (ref 30.0–36.0)
MCV: 90.4 fL (ref 78.0–100.0)
Platelets: 159 10*3/uL (ref 150–400)
RBC: 5.13 MIL/uL (ref 4.22–5.81)
RDW: 13.1 % (ref 11.5–15.5)
WBC: 6.9 10*3/uL (ref 4.0–10.5)

## 2012-02-26 LAB — COMPREHENSIVE METABOLIC PANEL WITH GFR
ALT: 21 U/L (ref 0–53)
AST: 17 U/L (ref 0–37)
Albumin: 3.4 g/dL — ABNORMAL LOW (ref 3.5–5.2)
Alkaline Phosphatase: 71 U/L (ref 39–117)
BUN: 16 mg/dL (ref 6–23)
CO2: 29 meq/L (ref 19–32)
Calcium: 9.2 mg/dL (ref 8.4–10.5)
Chloride: 105 meq/L (ref 96–112)
Creatinine, Ser: 0.9 mg/dL (ref 0.50–1.35)
GFR calc Af Amer: >90 mL/min
GFR calc non Af Amer: 89 mL/min — ABNORMAL LOW
Glucose, Bld: 175 mg/dL — ABNORMAL HIGH (ref 70–99)
Potassium: 3.8 meq/L (ref 3.5–5.1)
Sodium: 142 meq/L (ref 135–145)
Total Bilirubin: 0.5 mg/dL (ref 0.3–1.2)
Total Protein: 5.8 g/dL — ABNORMAL LOW (ref 6.0–8.3)

## 2012-02-26 LAB — TROPONIN I
Troponin I: <0.3 ng/mL
Troponin I: <0.3 ng/mL (ref <0.30)

## 2012-02-26 LAB — GLUCOSE, CAPILLARY
Glucose-Capillary: 171 mg/dL — ABNORMAL HIGH (ref 70–99)
Glucose-Capillary: 144 mg/dL — ABNORMAL HIGH (ref 70–99)

## 2012-02-26 LAB — PROTIME-INR
INR: 1.13 (ref 0.00–1.49)
Prothrombin Time: 14.7 seconds (ref 11.6–15.2)

## 2012-02-26 LAB — PLATELET INHIBITION P2Y12: Platelet Function  P2Y12: 161 [PRU] — ABNORMAL LOW (ref 194–418)

## 2012-02-26 LAB — MRSA PCR SCREENING: MRSA by PCR: NEGATIVE

## 2012-02-26 LAB — HEPARIN LEVEL (UNFRACTIONATED): Heparin Unfractionated: 0.11 IU/mL — ABNORMAL LOW (ref 0.30–0.70)

## 2012-02-26 MED ORDER — TRAMADOL HCL 50 MG PO TABS
50.0000 mg | ORAL_TABLET | Freq: Four times a day (QID) | ORAL | Status: DC | PRN
  Filled 2012-02-26: qty 1

## 2012-02-26 MED ORDER — CLOPIDOGREL BISULFATE 75 MG PO TABS
75.0000 mg | ORAL_TABLET | Freq: Every day | ORAL | Status: DC
  Administered 2012-02-27: 75 mg via ORAL
  Filled 2012-02-26: qty 1

## 2012-02-26 MED ORDER — INSULIN DETEMIR 100 UNIT/ML ~~LOC~~ SOLN
26.0000 [IU] | Freq: Every day | SUBCUTANEOUS | Status: DC
  Filled 2012-02-26: qty 10

## 2012-02-26 MED ORDER — INSULIN ASPART 100 UNIT/ML ~~LOC~~ SOLN
0.0000 [IU] | Freq: Three times a day (TID) | SUBCUTANEOUS | Status: DC
  Administered 2012-02-26: 1 [IU] via SUBCUTANEOUS

## 2012-02-26 MED ORDER — ONDANSETRON HCL 4 MG/2ML IJ SOLN
4.0000 mg | Freq: Four times a day (QID) | INTRAMUSCULAR | Status: DC | PRN
  Administered 2012-02-27: 4 mg via INTRAVENOUS
  Filled 2012-02-26: qty 2

## 2012-02-26 MED ORDER — ALPRAZOLAM 0.25 MG PO TABS
0.2500 mg | ORAL_TABLET | Freq: Three times a day (TID) | ORAL | Status: DC | PRN
  Administered 2012-02-26: 0.25 mg via ORAL
  Filled 2012-02-26: qty 1

## 2012-02-26 MED ORDER — NITROGLYCERIN IN D5W 200-5 MCG/ML-% IV SOLN
2.0000 ug/min | INTRAVENOUS | Status: DC
  Administered 2012-02-26: 3 ug/min via INTRAVENOUS
  Filled 2012-02-26: qty 250

## 2012-02-26 MED ORDER — BENAZEPRIL HCL 10 MG PO TABS
10.0000 mg | ORAL_TABLET | Freq: Every day | ORAL | Status: DC
  Filled 2012-02-26: qty 1

## 2012-02-26 MED ORDER — INSULIN ASPART 100 UNIT/ML ~~LOC~~ SOLN
0.0000 [IU] | Freq: Every day | SUBCUTANEOUS | Status: DC
Start: 2012-02-26 — End: 2012-02-27

## 2012-02-26 MED ORDER — ASPIRIN EC 81 MG PO TBEC
81.0000 mg | DELAYED_RELEASE_TABLET | Freq: Every day | ORAL | Status: DC
  Administered 2012-02-27: 81 mg via ORAL
  Filled 2012-02-26: qty 1

## 2012-02-26 MED ORDER — METOPROLOL SUCCINATE ER 50 MG PO TB24
50.0000 mg | ORAL_TABLET | Freq: Every day | ORAL | Status: DC
  Filled 2012-02-26: qty 1

## 2012-02-26 MED ORDER — DIAZEPAM 5 MG PO TABS: 5.0000 mg | ORAL_TABLET | ORAL | Status: DC

## 2012-02-26 MED ORDER — MONTELUKAST SODIUM 10 MG PO TABS
10.0000 mg | ORAL_TABLET | Freq: Every day | ORAL | Status: DC
  Administered 2012-02-26: 10 mg via ORAL
  Filled 2012-02-26 (×2): qty 1

## 2012-02-26 MED ORDER — ALPRAZOLAM 0.25 MG PO TABS: 0.2500 mg | ORAL_TABLET | Freq: Two times a day (BID) | ORAL | Status: DC | PRN

## 2012-02-26 MED ORDER — HEPARIN BOLUS VIA INFUSION
2000.0000 [IU] | Freq: Once | INTRAVENOUS | Status: AC
  Administered 2012-02-26: 2000 [IU] via INTRAVENOUS
  Filled 2012-02-26: qty 2000

## 2012-02-26 MED ORDER — HEPARIN (PORCINE) IN NACL 100-0.45 UNIT/ML-% IJ SOLN
2000.0000 [IU]/h | INTRAMUSCULAR | Status: DC
  Administered 2012-02-26: 1400 [IU]/h via INTRAVENOUS
  Administered 2012-02-27: 1650 [IU]/h via INTRAVENOUS
  Filled 2012-02-26 (×5): qty 250

## 2012-02-26 MED ORDER — ACETAMINOPHEN 325 MG PO TABS: 650.0000 mg | ORAL_TABLET | ORAL | Status: DC | PRN

## 2012-02-26 MED ORDER — ZOLPIDEM TARTRATE 5 MG PO TABS: 5.0000 mg | ORAL_TABLET | Freq: Every evening | ORAL | Status: DC | PRN

## 2012-02-26 MED ORDER — INSULIN DETEMIR 100 UNIT/ML ~~LOC~~ SOLN
13.0000 [IU] | Freq: Once | SUBCUTANEOUS | Status: AC
  Administered 2012-02-26: 13 [IU] via SUBCUTANEOUS

## 2012-02-26 MED ORDER — SODIUM CHLORIDE 0.9 % IV SOLN: 250.0000 mL | INTRAVENOUS | Status: DC | PRN

## 2012-02-26 MED ORDER — ATORVASTATIN CALCIUM 80 MG PO TABS
80.0000 mg | ORAL_TABLET | Freq: Every day | ORAL | Status: DC
  Filled 2012-02-26 (×2): qty 1

## 2012-02-26 MED ORDER — HEPARIN BOLUS VIA INFUSION
4000.0000 [IU] | Freq: Once | INTRAVENOUS | Status: AC
  Administered 2012-02-26: 4000 [IU] via INTRAVENOUS
  Filled 2012-02-26: qty 4000

## 2012-02-26 MED ORDER — PANTOPRAZOLE SODIUM 40 MG PO TBEC
40.0000 mg | DELAYED_RELEASE_TABLET | Freq: Every day | ORAL | Status: DC
  Administered 2012-02-26 – 2012-02-27 (×2): 40 mg via ORAL
  Filled 2012-02-26 (×2): qty 1

## 2012-02-26 MED ORDER — MORPHINE SULFATE 2 MG/ML IJ SOLN: 2.0000 mg | INTRAMUSCULAR | Status: DC | PRN

## 2012-02-26 MED ORDER — BUSPIRONE HCL 10 MG PO TABS
10.0000 mg | ORAL_TABLET | Freq: Every day | ORAL | Status: DC
  Administered 2012-02-27: 10 mg via ORAL
  Filled 2012-02-26 (×2): qty 1

## 2012-02-26 MED ORDER — NITROGLYCERIN 0.4 MG SL SUBL: 0.4000 mg | SUBLINGUAL_TABLET | SUBLINGUAL | Status: DC | PRN

## 2012-02-26 MED ORDER — SODIUM CHLORIDE 0.9 % IJ SOLN: 3.0000 mL | INTRAMUSCULAR | Status: DC | PRN

## 2012-02-26 MED ORDER — SODIUM CHLORIDE 0.9 % IV SOLN
INTRAVENOUS | Status: DC
  Administered 2012-02-26 – 2012-02-27 (×2): via INTRAVENOUS

## 2012-02-26 NOTE — Progress Notes (Signed)
ANTICOAGULATION CONSULT NOTE - Initial Consult  Pharmacy Consult for Heparin Indication: chest pain/ACS  Allergies  Allergen Reactions  . Fish Allergy Itching  . Fish Oil     Patient Measurements: Height: 6' (182.9 cm) Weight: 234 lb 5.6 oz (106.3 kg) IBW/kg (Calculated) : 77.6  1.25X IBW = 97kg  Heparin Dosing Weight: 100kg  Vital Signs: Temp: 97.8 F (36.6 C) (09/09 1300) Temp src: Oral (09/09 1300) BP: 119/65 mmHg (09/09 1330) Pulse Rate: 66  (09/09 1330)  Labs: No results found for this basename: HGB:2,HCT:3,PLT:3,APTT:3,LABPROT:3,INR:3,HEPARINUNFRC:3,CREATININE:3,CKTOTAL:3,CKMB:3,TROPONINI:3 in the last 72 hours  Estimated Creatinine Clearance: 88.2 ml/min (by C-G formula based on Cr of 1.08).   Medical History: Past Medical History  Diagnosis Date  . Diabetes mellitus   . Coronary artery disease   . Myocardial infarction   . Hypertension   . Shortness of breath   . Sleep apnea     Medications:  Prescriptions prior to admission  Medication Sig Dispense Refill  . aspirin EC 81 MG tablet Take 81 mg by mouth daily.      . benazepril (LOTENSIN) 10 MG tablet Take 10 mg by mouth daily.      . busPIRone (BUSPAR) 10 MG tablet Take 10 mg by mouth daily.       . clopidogrel (PLAVIX) 75 MG tablet Take 75 mg by mouth daily.      [name] dexlansoprazole (DEXILANT) 60 MG capsule Take 60 mg by mouth daily.      . insulin detemir (LEVEMIR) 100 UNIT/ML injection Inject 26 Units into the skin at bedtime.      . metoprolol succinate (TOPROL-XL) 50 MG 24 hr tablet Take 50 mg by mouth daily. Take with or immediately following a meal.      . montelukast (SINGULAIR) 10 MG tablet Take 10 mg by mouth at bedtime.      . rosuvastatin (CRESTOR) 20 MG tablet Take 20 mg by mouth daily.        Assessment: 61 yo M admitted 02/26/2012 from clinic with pain syndrome similar to last MI.  Pharmacy consulted to dose heparin.  Anticoagulation: ACS, to start heparin,patient denies recent bleeding,  surgery or bleeding disorder, on ASA and plavix PTA.  Goal of Therapy:  Heparin level 0.3-0.7 units/ml Monitor platelets by anticoagulation protocol: Yes   Plan:  Give 4000 units bolus x 1 Start heparin infusion at 1400 units/hr Check anti-Xa level in 6 hours and daily while on heparin Continue to monitor H&H and platelets   Thank you for allowing pharmacy to be a part of this patients care team.  Lovenia Kim Pharm.D., BCPS Clinical Pharmacist 02/26/2012 3:03 PM Pager: [phone] Phone: [phone]

## 2012-02-26 NOTE — Progress Notes (Signed)
ANTICOAGULATION CONSULT NOTE   Pharmacy Consult for Heparin Indication: chest pain/ACS  Allergies  Allergen Reactions  . Fish Allergy Itching  . Fish Oil     Patient Measurements: Height: 6' (182.9 cm) Weight: 234 lb 5.6 oz (106.3 kg) IBW/kg (Calculated) : 77.6  1.25X IBW = 97kg  Heparin Dosing Weight: 100kg  Vital Signs: Temp: 97.6 F (36.4 C) (09/09 2000) Temp src: Oral (09/09 2000) BP: 117/69 mmHg (09/09 2000) Pulse Rate: 66  (09/09 1900)  Labs:  Basename 02/26/12 2040 02/26/12 1440  HGB -- 15.5  HCT -- 46.4  PLT -- 159  APTT -- --  LABPROT -- 14.7  INR -- 1.13  HEPARINUNFRC 0.11* --  CREATININE -- 0.90  CKTOTAL -- --  CKMB -- --  TROPONINI -- <0.30    Estimated Creatinine Clearance: 105.9 ml/min (by C-G formula based on Cr of 0.9).   Medical History: Past Medical History  Diagnosis Date  . Diabetes mellitus   . Coronary artery disease   . Myocardial infarction   . Hypertension   . Shortness of breath   . Sleep apnea     Medications:  Prescriptions prior to admission  Medication Sig Dispense Refill  . aspirin EC 81 MG tablet Take 81 mg by mouth daily.      . benazepril (LOTENSIN) 10 MG tablet Take 10 mg by mouth daily.      . busPIRone (BUSPAR) 10 MG tablet Take 10 mg by mouth daily.       . clopidogrel (PLAVIX) 75 MG tablet Take 75 mg by mouth daily.      [name] dexlansoprazole (DEXILANT) 60 MG capsule Take 60 mg by mouth daily.      . insulin detemir (LEVEMIR) 100 UNIT/ML injection Inject 26 Units into the skin at bedtime.      . metoprolol succinate (TOPROL-XL) 50 MG 24 hr tablet Take 50 mg by mouth daily. Take with or immediately following a meal.      . montelukast (SINGULAIR) 10 MG tablet Take 10 mg by mouth at bedtime.      . rosuvastatin (CRESTOR) 20 MG tablet Take 20 mg by mouth daily.        Assessment: 61 yo M admitted 02/26/2012 from clinic with pain syndrome similar to last MI.  Pharmacy consulted to dose heparin.    Anticoagulation:  ACS, to start heparin,patient denies recent bleeding, surgery or bleeding disorder, on ASA and plavix PTA. Initial heparin level subtherapeutic @ 0.11 units/ml.  Goal of Therapy:  Heparin level 0.3-0.7 units/ml Monitor platelets by anticoagulation protocol: Yes   Plan:  Give 2000 units bolus x 1 Increase heparin infusion to 1650 units/hr Check anti-Xa level in am Continue to monitor H&H and platelets   Thank you for allowing pharmacy to be a part of this patients care team.  Talbert Cage, PharmD Clinical Pharmacist 02/26/2012 9:45 PM Phone: [phone]

## 2012-02-26 NOTE — Progress Notes (Signed)
Utilization Review Completed.John [name] T9/02/2012

## 2012-02-26 NOTE — H&P (Signed)
Patient ID: John [name] MRN: [id number], DOB/AGE: [birth year]   Admit date: 02/26/2012   Primary Physician: Leo Grosser, MD Primary Cardiologist: Dr Alanda Amass  HPI: 61 y/o male with a history of CAD dating back to 1995 when he had a stent placed to his CFX OD in the setting of a posterior MI. (He had an iliac stent placed as there were no large cardiac stens available at that time). He then had an RCA stent placed in 1996 with ISR 2000, 2001, 2004, and June 2010. Myoview done Oct 2012 was low risk. Echo done 2012 showed an EF of 50-55%. He saw Dr Alanda Amass today in the office in [place] with complaints of recurrent SSCP over the past two weeks. He says the pain is similar to his first MI with radiation to his throat and Lt arm. A couple of these spells have been associated with nausea and diaphoresis. He says that he felt so bad he quit smoking last week. He is admitted now for cath in am.    Problem List: Past Medical History  Diagnosis Date  . Diabetes mellitus   . Coronary artery disease   . Myocardial infarction   . Hypertension   . Shortness of breath   . Sleep apnea     Past Surgical History  Procedure Date  . Back surgery 1985  . Cardiac catheterization 2010    6 stents total     Allergies:  Allergies  Allergen Reactions  . Fish Allergy Itching  . Fish Oil      Home Medications Prescriptions prior to admission  Medication Sig Dispense Refill  . aspirin EC 81 MG tablet Take 81 mg by mouth daily.      . benazepril (LOTENSIN) 10 MG tablet Take 10 mg by mouth daily.      . busPIRone (BUSPAR) 10 MG tablet Take 10 mg by mouth daily.       . clopidogrel (PLAVIX) 75 MG tablet Take 75 mg by mouth daily.      [name] dexlansoprazole (DEXILANT) 60 MG capsule Take 60 mg by mouth daily.      . insulin detemir (LEVEMIR) 100 UNIT/ML injection Inject 26 Units into the skin at bedtime.      . metoprolol succinate (TOPROL-XL) 50 MG 24 hr tablet Take 50 mg by mouth daily. Take  with or immediately following a meal.      . montelukast (SINGULAIR) 10 MG tablet Take 10 mg by mouth at bedtime.      . rosuvastatin (CRESTOR) 20 MG tablet Take 20 mg by mouth daily.         No family history on file.   History   Social History  . Marital Status: Married    Spouse Name: N/A    Number of Children: N/A  . Years of Education: N/A   Occupational History  . Not on file.   Social History Main Topics  . Smoking status: Former Smoker -- 1.0 packs/day for 50 years    Quit date: 02/22/2012  . Smokeless tobacco: Not on file  . Alcohol Use: No  . Drug Use: No  . Sexually Active: Yes   Other Topics Concern  . Not on file   Social History Narrative  . No narrative on file     Review of Systems: No GI bleeding. No syncope. He has chronic Lt foot drop from back surgery in 1985. He wears a brace for this. He has OSA and is compliant with C-pap. All  other systems reviewed and are otherwise negative except as noted above.  Physical Exam: Blood pressure 119/65, pulse 66, temperature 97.8 F (36.6 C), temperature source Oral, resp. rate 16, height 6' (1.829 m), weight 106.3 kg (234 lb 5.6 oz), SpO2 94.00%.  General appearance: alert, cooperative and no distress Neck: no carotid bruit and no JVD Lungs: clear to auscultation bilaterally Heart: regular rate and rhythm Abdomen: obese, non tender, positive bowel sounds Extremities: no edema Pulses: 2+ and symmetric Skin: Skin color, texture, turgor normal. No rashes or lesions Neurologic: Grossly normal    Labs:   Results for orders placed during the hospital encounter of 02/26/12 (from the past 24 hour(s))  GLUCOSE, CAPILLARY     Status: Abnormal   Collection Time   02/26/12  1:21 PM      Component Value Range   Glucose-Capillary 171 (*) 70 - 99 mg/dL     Radiology/Studies: No results found.  EKG:NSR/SB , inf Q's, PACs  ASSESSMENT AND PLAN:  Principal Problem:  *Unstable angina Active Problems:  CAD,  multiple prior RCA PCI's. Last cath 2010, Myoview low risk Oct 2012  IDDM (insulin dependent diabetes mellitus)  COPD (chronic obstructive pulmonary disease)  HTN (hypertension)  Dyslipidemia, low HDL. Intol to fish oil and Noacin  Sleep apnea, on C-pap  Smoking, quit one week ago  GERD (gastroesophageal reflux disease)   Plan- Heparin, NTG, cath in am. Check P2Y12 as he has had ISR on Plavix in the past and possibly this admission.   Deland Pretty, PA-C 02/26/2012, 1:46 PM

## 2012-02-27 ENCOUNTER — Encounter (HOSPITAL_COMMUNITY)
Admission: AD | Disposition: A | Discharge status: Home / Self Care | Payer: Self-pay | Source: Ambulatory Visit | Attending: Cardiovascular Disease

## 2012-02-27 HISTORY — PX: LEFT HEART CATHETERIZATION WITH CORONARY ANGIOGRAM: SHX5451

## 2012-02-27 LAB — TSH: TSH: 0.584 u[IU]/mL (ref 0.350–4.500)

## 2012-02-27 LAB — URINALYSIS, ROUTINE W REFLEX MICROSCOPIC
Glucose, UA: NEGATIVE mg/dL
Hgb urine dipstick: NEGATIVE
Ketones, ur: NEGATIVE mg/dL
Leukocytes, UA: NEGATIVE
Nitrite: NEGATIVE
Protein, ur: NEGATIVE mg/dL
Specific Gravity, Urine: 1.023 (ref 1.005–1.030)
Urobilinogen, UA: 1 mg/dL (ref 0.0–1.0)
pH: 5 (ref 5.0–8.0)

## 2012-02-27 LAB — BASIC METABOLIC PANEL
BUN: 16 mg/dL (ref 6–23)
CO2: 30 mEq/L (ref 19–32)
Calcium: 8.9 mg/dL (ref 8.4–10.5)
Chloride: 110 mEq/L (ref 96–112)
Creatinine, Ser: 0.99 mg/dL (ref 0.50–1.35)
GFR calc Af Amer: >90 mL/min (ref >90)
GFR calc non Af Amer: 85 mL/min — ABNORMAL LOW (ref >90)
Glucose, Bld: 116 mg/dL — ABNORMAL HIGH (ref 70–99)
Potassium: 3.6 mEq/L (ref 3.5–5.1)
Sodium: 146 mEq/L — ABNORMAL HIGH (ref 135–145)

## 2012-02-27 LAB — GLUCOSE, CAPILLARY
Glucose-Capillary: 147 mg/dL — ABNORMAL HIGH (ref 70–99)
Glucose-Capillary: 114 mg/dL — ABNORMAL HIGH (ref 70–99)

## 2012-02-27 LAB — CBC
HCT: 43.4 % (ref 39.0–52.0)
Hemoglobin: 14.5 g/dL (ref 13.0–17.0)
MCH: 30.2 pg (ref 26.0–34.0)
MCHC: 33.4 g/dL (ref 30.0–36.0)
MCV: 90.4 fL (ref 78.0–100.0)
Platelets: 135 10*3/uL — ABNORMAL LOW (ref 150–400)
RBC: 4.8 MIL/uL (ref 4.22–5.81)
RDW: 13.3 % (ref 11.5–15.5)
WBC: 7.6 10*3/uL (ref 4.0–10.5)

## 2012-02-27 LAB — HEMOGLOBIN A1C
Hgb A1c MFr Bld: 7.3 % — ABNORMAL HIGH (ref <5.7)
Mean Plasma Glucose: 163 mg/dL — ABNORMAL HIGH (ref <117)

## 2012-02-27 LAB — HEPARIN LEVEL (UNFRACTIONATED): Heparin Unfractionated: 0.56 IU/mL (ref 0.30–0.70)

## 2012-02-27 LAB — POCT ACTIVATED CLOTTING TIME: Activated Clotting Time: 159 seconds

## 2012-02-27 SURGERY — LEFT HEART CATHETERIZATION WITH CORONARY ANGIOGRAM
Anesthesia: LOCAL

## 2012-02-27 MED ORDER — MORPHINE SULFATE 4 MG/ML IJ SOLN
INTRAMUSCULAR | Status: AC
  Filled 2012-02-27: qty 1

## 2012-02-27 MED ORDER — SODIUM CHLORIDE 0.9 % IV SOLN: INTRAVENOUS | Status: DC

## 2012-02-27 MED ORDER — HEPARIN (PORCINE) IN NACL 2-0.9 UNIT/ML-% IJ SOLN
INTRAMUSCULAR | Status: AC
  Filled 2012-02-27: qty 2000

## 2012-02-27 MED ORDER — ACETAMINOPHEN 325 MG PO TABS: 650.0000 mg | ORAL_TABLET | ORAL | Status: DC | PRN

## 2012-02-27 MED ORDER — DIAZEPAM 5 MG PO TABS
5.0000 mg | ORAL_TABLET | ORAL | Status: AC
  Administered 2012-02-27: 5 mg via ORAL
  Filled 2012-02-27: qty 1

## 2012-02-27 MED ORDER — METOPROLOL SUCCINATE ER 25 MG PO TB24: 25.0000 mg | ORAL_TABLET | Freq: Every day | ORAL | Status: DC

## 2012-02-27 MED ORDER — POTASSIUM CHLORIDE CRYS ER 20 MEQ PO TBCR
40.0000 meq | EXTENDED_RELEASE_TABLET | Freq: Once | ORAL | Status: AC
  Administered 2012-02-27: 40 meq via ORAL
  Filled 2012-02-27: qty 2

## 2012-02-27 MED ORDER — MORPHINE SULFATE 4 MG/ML IJ SOLN
1.0000 mg | INTRAMUSCULAR | Status: DC | PRN
  Administered 2012-02-27: 1 mg via INTRAVENOUS

## 2012-02-27 MED ORDER — NITROGLYCERIN 0.2 MG/ML ON CALL CATH LAB
INTRAVENOUS | Status: AC
  Filled 2012-02-27: qty 1

## 2012-02-27 MED ORDER — LIDOCAINE HCL (PF) 1 % IJ SOLN
INTRAMUSCULAR | Status: AC
  Filled 2012-02-27: qty 30

## 2012-02-27 MED ORDER — ONDANSETRON HCL 4 MG/2ML IJ SOLN: 4.0000 mg | Freq: Four times a day (QID) | INTRAMUSCULAR | Status: DC | PRN

## 2012-02-27 MED ORDER — NITROGLYCERIN 0.4 MG SL SUBL: 0.4000 mg | SUBLINGUAL_TABLET | SUBLINGUAL | Status: DC | PRN

## 2012-02-27 MED ORDER — METOPROLOL TARTRATE 25 MG PO TABS: 25.0000 mg | ORAL_TABLET | Freq: Every morning | ORAL | Status: DC

## 2012-02-27 NOTE — Op Note (Signed)
John [name] is a 61 y.o. male    [id number] LOCATION:  FACILITY: [facility]  PHYSICIAN: Nanetta Batty, M.D. 1951-01-12   DATE OF PROCEDURE:  02/27/2012  DATE OF DISCHARGE:  [facility]  CARDIAC CATHETERIZATION     History obtained from chart review. John [name] is a 61 year old married Caucasian male patient of Dr. Susa Griffins with a history  of hypertension, hyperlipidemia, insulin-dependent diabetes and a long history of coronary artery disease.he had his first procedure done in 1995 and has had multiple procedures since with stents in his RCA. He was admitted yesterday with accelerated angina by Dr. Alanda Amass and ruled out for myocardial infarction. He was placed on IV heparin and nitroglycerin. He presents now for diagnostic coronary arteriography to define his anatomy and rule out an ischemic etiology   PROCEDURE DESCRIPTION:    The patient was brought to the second floor  [facility] cath lab in the postabsorptive state. He was  premedicated with Valium 5 mg by mouth. His right groin was prepped and shaved in usual sterile fashion. Xylocaine 1% was used for local anesthesia. A 5 French sheath was inserted into the right common femoral artery using standard Seldinger technique. 5 French right and left Judkins diagnostic catheters Lopid 5 French pigtail catheter were used for selective coronary angiography and left ventriculography respectively. Visipaque dye was used for the entirety of the case. Retrograde aortic, left ventricular and pullback pressures were recorded.  HEMODYNAMICS:    AO SYSTOLIC/AO DIASTOLIC: 124/73   LV SYSTOLIC/LV DIASTOLIC: 123/20  ANGIOGRAPHIC RESULTS:   1. Left main; normal  2. LAD; minor irregularities 3. Left circumflex; nondominant with majority of the circumflex circumflex circulation and a large ramus branch.  4. Right coronary artery; dominant with "full metal jacket" and no evidence of "in-stent  restenosis". There was 40-50% smooth narrowing beyond the stented segment in the distal vessel. 5. Left ventriculography; RAO left ventriculogram was performed using  25 mL the ejection fraction was 35-40% Without wall motion abnormalities  IMPRESSION:no evidence of in-stent restenosis with minor irregularities and moderate decrease in LV function. The sheath was removed and pressure was held on the groin to achieve hemostasis. The patient left the Cath Lab in stable condition. He'll be gently hydrated and discharged later today. He'll followup with Dr.Richard Reita May MD, [facility] 02/27/2012 1:41 PM

## 2012-02-27 NOTE — Progress Notes (Signed)
ANTICOAGULATION CONSULT NOTE   Pharmacy Consult for Heparin Indication: chest pain/ACS  Patient Measurements: Height: 6' (182.9 cm) Weight: 234 lb 5.6 oz (106.3 kg) IBW/kg (Calculated) : 77.6  1.25X IBW = 97kg  Heparin Dosing Weight: 100kg  Vital Signs: Temp: 98 F (36.7 C) (09/10 1200) Temp src: Oral (09/10 1200) BP: 102/67 mmHg (09/10 1200) Pulse Rate: 54  (09/10 1200)  Labs:  Basename 02/27/12 1106 02/27/12 0340 02/26/12 2201 02/26/12 2040 02/26/12 1440  HGB -- 14.5 -- -- 15.5  HCT -- 43.4 -- -- 46.4  PLT -- 135* -- -- 159  APTT -- -- -- -- --  LABPROT -- -- -- -- 14.7  INR -- -- -- -- 1.13  HEPARINUNFRC 0.56 0.19* -- 0.11* --  CREATININE -- 0.99 -- -- 0.90  CKTOTAL -- -- -- -- --  CKMB -- -- -- -- --  TROPONINI -- -- <0.30 -- <0.30    Estimated Creatinine Clearance: 96.3 ml/min (by C-G formula based on Cr of 0.99).   Assessment: 61 yo M admitted 02/26/2012 from clinic with pain syndrome similar to last MI.  Pharmacy consulted to dose heparin.    Anticoagulation: ACS, to start heparin,patient denies recent bleeding, surgery or bleeding disorder, on ASA and plavix PTA. Initial heparin level subtherapeutic @ 0.11 units/ml. Follow up heparin level now therapeutic at 0.5. No bleeding complications have been noted.  Goal of Therapy:  Heparin level 0.3-0.7 units/ml Monitor platelets by anticoagulation protocol: Yes   Plan:  Continue heparin at 2000 units/hr Daily CBC/HL  Thank you for allowing pharmacy to be a part of this patients care team.  Sheppard Coil, PharmD Clinical Pharmacist 02/27/2012 12:37 PM Phone: [phone]

## 2012-02-27 NOTE — Progress Notes (Signed)
John [name] Paged again concerning John [name] nausea, zofran given and EKG done.

## 2012-02-27 NOTE — Progress Notes (Signed)
Subjective:  Nausea earlier this am, no chest pain. HR down in the 30's when sleeping but C-pap had not been ordered.  Objective:  Vital Signs in the last 24 hours: Temp:  [97.3 F (36.3 C)-98.5 F (36.9 C)] 97.3 F (36.3 C) (09/10 0000) Pulse Rate:  [46-67] 47  (09/10 0600) Resp:  [15-23] 18  (09/10 0600) BP: (85-124)/(45-79) 95/58 mmHg (09/10 0600) SpO2:  [93 %-98 %] 96 % (09/10 0600) Weight:  [106.3 kg (234 lb 5.6 oz)] 106.3 kg (234 lb 5.6 oz) (09/09 1300)  Intake/Output from previous day:  Intake/Output Summary (Last 24 hours) at 02/27/12 0752 Last data filed at 02/27/12 0600  Gross per 24 hour  Intake 1472.63 ml  Output    375 ml  Net 1097.63 ml    Physical Exam: General appearance: alert, cooperative and no distress Lungs: clear to auscultation bilaterally Heart: regular rate and rhythm   Rate: 70  Rhythm: normal sinus rhythm and sinus bradycardia  Lab Results:  Basename 02/27/12 0340 02/26/12 1440  WBC 7.6 6.9  HGB 14.5 15.5  PLT 135* 159    Basename 02/27/12 0340 02/26/12 1440  NA 146* 142  K 3.6 3.8  CL 110 105  CO2 30 29  GLUCOSE 116* 175*  BUN 16 16  CREATININE 0.99 0.90    Basename 02/26/12 2201 02/26/12 1440  TROPONINI <0.30 <0.30   Hepatic Function Panel  Basename 02/26/12 1440  PROT 5.8*  ALBUMIN 3.4*  AST 17  ALT 21  ALKPHOS 71  BILITOT 0.5  BILIDIR --  IBILI --   No results found for this basename: CHOL in the last 72 hours  Basename 02/26/12 1440  INR 1.13    Imaging: Dg Chest 2 View  02/26/2012  *RADIOLOGY REPORT*  Clinical Data: Chest pain, weakness, history of smoking  CHEST - 2 VIEW  Comparison: Chest x-ray of 11/26/2008  Findings: No active infiltrate or effusion is seen.  There is mild peribronchial thickening present and the lungs are slightly hyperaerated.  Mild cardiomegaly is stable.  There are diffuse degenerative changes throughout the thoracic spine.  IMPRESSION: Probable chronic bronchitis.  No active lung  disease.   Original Report Authenticated By: Juline Patch, M.D.     Cardiac Studies:  Assessment/Plan:   Principal Problem:  *Unstable angina Active Problems:  CAD, multiple prior RCA PCI's. Last cath 2010, Myoview low risk Oct 2012  IDDM (insulin dependent diabetes mellitus)  COPD (chronic obstructive pulmonary disease)  Bradycardia, HR in the 30's when sleeping, beta blocker cut back  HTN (hypertension)  Dyslipidemia, low HDL. Intol to fish oil and Noacin  Sleep apnea, on C-pap  Smoking, quit one week ago  GERD (gastroesophageal reflux disease)   Plan- P2Y12 low at 161 on Plavix. HR slow during the night but C-pap had not been ordered, will decrease beta blocker and observe. B/P low this am, hold ACE today.   Corine Shelter PA-C 02/27/2012, 7:52 AM

## 2012-02-27 NOTE — Progress Notes (Signed)
Pt. Seen and examined. Agree with the NP/PA-C note as written.  61 yo male with recurrent in-stent thrombosis and restenosis, presents with chest pain concerning for unstable angina, despite medical therapy. Troponin negative x 2. P2Y12 shows adequate platelet suppression on aspirin and plavix. Plan for LHC today with Dr. Allyson Sabal and possible PCI.  Chrystie Nose, MD, [facility] Attending Cardiologist [facility]

## 2012-02-27 NOTE — Progress Notes (Signed)
ANTICOAGULATION CONSULT NOTE   Pharmacy Consult for Heparin Indication: chest pain/ACS  Allergies  Allergen Reactions  . Fish Allergy Itching  . Fish Oil     Patient Measurements: Height: 6' (182.9 cm) Weight: 234 lb 5.6 oz (106.3 kg) IBW/kg (Calculated) : 77.6  1.25X IBW = 97kg  Heparin Dosing Weight: 100kg  Vital Signs: Temp: 97.3 F (36.3 C) (09/10 0000) Temp src: Oral (09/10 0000) BP: 85/45 mmHg (09/10 0200) Pulse Rate: 53  (09/10 0200)  Labs:  Basename 02/27/12 0340 02/26/12 2201 02/26/12 2040 02/26/12 1440  HGB 14.5 -- -- 15.5  HCT 43.4 -- -- 46.4  PLT 135* -- -- 159  APTT -- -- -- --  LABPROT -- -- -- 14.7  INR -- -- -- 1.13  HEPARINUNFRC 0.19* -- 0.11* --  CREATININE -- -- -- 0.90  CKTOTAL -- -- -- --  CKMB -- -- -- --  TROPONINI -- <0.30 -- <0.30    Estimated Creatinine Clearance: 105.9 ml/min (by C-G formula based on Cr of 0.9).   Medical History: Past Medical History  Diagnosis Date  . Diabetes mellitus   . Coronary artery disease   . Myocardial infarction   . Hypertension   . Shortness of breath   . Sleep apnea     Medications:  Prescriptions prior to admission  Medication Sig Dispense Refill  . aspirin EC 81 MG tablet Take 81 mg by mouth daily.      . benazepril (LOTENSIN) 10 MG tablet Take 10 mg by mouth daily.      . busPIRone (BUSPAR) 10 MG tablet Take 10 mg by mouth daily.       . clopidogrel (PLAVIX) 75 MG tablet Take 75 mg by mouth daily.      [name] dexlansoprazole (DEXILANT) 60 MG capsule Take 60 mg by mouth daily.      . insulin detemir (LEVEMIR) 100 UNIT/ML injection Inject 26 Units into the skin at bedtime.      . metoprolol succinate (TOPROL-XL) 50 MG 24 hr tablet Take 50 mg by mouth daily. Take with or immediately following a meal.      . montelukast (SINGULAIR) 10 MG tablet Take 10 mg by mouth at bedtime.      . rosuvastatin (CRESTOR) 20 MG tablet Take 20 mg by mouth daily.        Assessment: 61 yo M admitted 02/26/2012 from  clinic with pain syndrome similar to last MI.  Pharmacy consulted to dose heparin.    Anticoagulation: ACS, to start heparin,patient denies recent bleeding, surgery or bleeding disorder, on ASA and plavix PTA.  Heparin level (0.19) is below-goal on 1650 units/hr. No problem with line / infusion per RN.   Goal of Therapy:  Heparin level 0.3-0.7 units/ml Monitor platelets by anticoagulation protocol: Yes   Plan:  1. Increase IV heparin to 2000 units/hr 2. Heparin level in 6 hours vs follow-up post-cath.   Lorre Munroe, PharmD, BCPS 02/27/2012 5:19 AM

## 2012-02-27 NOTE — Discharge Summary (Signed)
Patient ID: John [name],  MRN: [id number], DOB/AGE: 63-May-1952 63 y.o.  Admit date: 02/26/2012 Discharge date: 02/27/2012  Primary Care Provider:  Primary Cardiologist: Dr John [name]  Discharge Diagnoses Principal Problem:  *Unstable angina, cath [facility] 02/27/12 Active Problems:  CAD, multiple prior RCA PCI's. Last cath 2010, Myoview low risk Oct 2012  IDDM (insulin dependent diabetes mellitus)  COPD (chronic obstructive pulmonary disease)  Bradycardia, HR in the 30's when sleeping, beta blocker cut back  HTN (hypertension)  Dyslipidemia, low HDL. Intol to fish oil and Noacin  Sleep apnea, on C-pap  Smoking, quit one week ago  GERD (gastroesophageal reflux disease)    Procedures: Cath 02/27/12   Hospital Course:  61 y/o male with a history of CAD dating back to 1995 when he had a stent placed to his CFX OD in the setting of a posterior MI. (He had an iliac stent placed as there were no large cardiac stens available at that time). He then had an RCA stent placed in 1996 with ISR 2000, 2001, 2004, and June 2010. Myoview done Oct 2012 was low risk. Echo done 2012 showed an EF of 50-55%. He saw Dr John [name] 02/26/12 in the office in [place] with complaints of recurrent SSCP over the previous two weeks. He says the pain is similar to his first MI with radiation to his throat and Lt arm. A couple of these spells have been associated with nausea and diaphoresis. He says that he felt so bad he quit smoking last week. He was admitted for cath from the office 02/26/12. Troponins were negative. Cath revealed the rght coronary artery; dominant with "full metal jacket" and no evidence of "in-stent restenosis". There was 40-50% smooth narrowing beyond the stented segment in the distal vessel. Dr John [name] feels he can be discharged 02/27/12. He will follow up with Dr John [name] in [place]. He did drop his HR into the 30's at night and we cut his beta blocker back at discharge.        Discharge Vitals:    Blood pressure 102/67, pulse 52, temperature 98 F (36.7 C), temperature source Oral, resp. rate 21, height 6' (1.829 m), weight 106.3 kg (234 lb 5.6 oz), SpO2 95.00%.    Labs: Results for orders placed during the hospital encounter of 02/26/12 (from the past 48 hour(s))  MRSA PCR SCREENING     Status: Normal   Collection Time   02/26/12 12:55 PM      Component Value Range Comment   MRSA by PCR NEGATIVE  NEGATIVE   GLUCOSE, CAPILLARY     Status: Abnormal   Collection Time   02/26/12  1:21 PM      Component Value Range Comment   Glucose-Capillary 171 (*) 70 - 99 mg/dL   PLATELET INHIBITION M5H84     Status: Abnormal   Collection Time   02/26/12  2:40 PM      Component Value Range Comment   Platelet Function  P2Y12 161 (*) 194 - 418 PRU   COMPREHENSIVE METABOLIC PANEL     Status: Abnormal   Collection Time   02/26/12  2:40 PM      Component Value Range Comment   Sodium 142  135 - 145 mEq/L    Potassium 3.8  3.5 - 5.1 mEq/L    Chloride 105  96 - 112 mEq/L    CO2 29  19 - 32 mEq/L    Glucose, Bld 175 (*) 70 - 99 mg/dL    BUN 16  6 -  23 mg/dL    Creatinine, Ser 1.61  0.50 - 1.35 mg/dL    Calcium 9.2  8.4 - 09.6 mg/dL    Total Protein 5.8 (*) 6.0 - 8.3 g/dL    Albumin 3.4 (*) 3.5 - 5.2 g/dL    AST 17  0 - 37 U/L    ALT 21  0 - 53 U/L    Alkaline Phosphatase 71  39 - 117 U/L    Total Bilirubin 0.5  0.3 - 1.2 mg/dL    GFR calc non Af Amer 89 (*) >90 mL/min    GFR calc Af Amer >90  >90 mL/min   CBC     Status: Normal   Collection Time   02/26/12  2:40 PM      Component Value Range Comment   WBC 6.9  4.0 - 10.5 K/uL    RBC 5.13  4.22 - 5.81 MIL/uL    Hemoglobin 15.5  13.0 - 17.0 g/dL    HCT 04.5  40.9 - 81.1 %    MCV 90.4  78.0 - 100.0 fL    MCH 30.2  26.0 - 34.0 pg    MCHC 33.4  30.0 - 36.0 g/dL    RDW 91.4  78.2 - 95.6 %    Platelets 159  150 - 400 K/uL   PROTIME-INR     Status: Normal   Collection Time   02/26/12  2:40 PM      Component Value Range Comment   Prothrombin Time  14.7  11.6 - 15.2 seconds    INR 1.13  0.00 - 1.49   TROPONIN I     Status: Normal   Collection Time   02/26/12  2:40 PM      Component Value Range Comment   Troponin I <0.30  <0.30 ng/mL   TSH     Status: Normal   Collection Time   02/26/12  2:40 PM      Component Value Range Comment   TSH 0.584  0.350 - 4.500 uIU/mL   HEMOGLOBIN A1C     Status: Abnormal   Collection Time   02/26/12  2:40 PM      Component Value Range Comment   Hemoglobin A1C 7.3 (*) <5.7 %    Mean Plasma Glucose 163 (*) <117 mg/dL   GLUCOSE, CAPILLARY     Status: Abnormal   Collection Time   02/26/12  4:32 PM      Component Value Range Comment   Glucose-Capillary 144 (*) 70 - 99 mg/dL   HEPARIN LEVEL (UNFRACTIONATED)     Status: Abnormal   Collection Time   02/26/12  8:40 PM      Component Value Range Comment   Heparin Unfractionated 0.11 (*) 0.30 - 0.70 IU/mL   GLUCOSE, CAPILLARY     Status: Abnormal   Collection Time   02/26/12  9:36 PM      Component Value Range Comment   Glucose-Capillary 147 (*) 70 - 99 mg/dL   TROPONIN I     Status: Normal   Collection Time   02/26/12 10:01 PM      Component Value Range Comment   Troponin I <0.30  <0.30 ng/mL   URINALYSIS, ROUTINE W REFLEX MICROSCOPIC     Status: Abnormal   Collection Time   02/27/12 12:01 AM      Component Value Range Comment   Color, Urine AMBER (*) YELLOW BIOCHEMICALS MAY BE AFFECTED BY COLOR   APPearance CLEAR  CLEAR  Specific Gravity, Urine 1.023  1.005 - 1.030    pH 5.0  5.0 - 8.0    Glucose, UA NEGATIVE  NEGATIVE mg/dL    Hgb urine dipstick NEGATIVE  NEGATIVE    Bilirubin Urine SMALL (*) NEGATIVE    Ketones, ur NEGATIVE  NEGATIVE mg/dL    Protein, ur NEGATIVE  NEGATIVE mg/dL    Urobilinogen, UA 1.0  0.0 - 1.0 mg/dL    Nitrite NEGATIVE  NEGATIVE    Leukocytes, UA NEGATIVE  NEGATIVE MICROSCOPIC NOT DONE ON URINES WITH NEGATIVE PROTEIN, BLOOD, LEUKOCYTES, NITRITE, OR GLUCOSE <1000 mg/dL.  BASIC METABOLIC PANEL     Status: Abnormal   Collection  Time   02/27/12  3:40 AM      Component Value Range Comment   Sodium 146 (*) 135 - 145 mEq/L    Potassium 3.6  3.5 - 5.1 mEq/L    Chloride 110  96 - 112 mEq/L    CO2 30  19 - 32 mEq/L    Glucose, Bld 116 (*) 70 - 99 mg/dL    BUN 16  6 - 23 mg/dL    Creatinine, Ser 4.09  0.50 - 1.35 mg/dL    Calcium 8.9  8.4 - 81.1 mg/dL    GFR calc non Af Amer 85 (*) >90 mL/min    GFR calc Af Amer >90  >90 mL/min   CBC     Status: Abnormal   Collection Time   02/27/12  3:40 AM      Component Value Range Comment   WBC 7.6  4.0 - 10.5 K/uL    RBC 4.80  4.22 - 5.81 MIL/uL    Hemoglobin 14.5  13.0 - 17.0 g/dL    HCT 91.4  78.2 - 95.6 %    MCV 90.4  78.0 - 100.0 fL    MCH 30.2  26.0 - 34.0 pg    MCHC 33.4  30.0 - 36.0 g/dL    RDW 21.3  08.6 - 57.8 %    Platelets 135 (*) 150 - 400 K/uL   HEPARIN LEVEL (UNFRACTIONATED)     Status: Abnormal   Collection Time   02/27/12  3:40 AM      Component Value Range Comment   Heparin Unfractionated 0.19 (*) 0.30 - 0.70 IU/mL   GLUCOSE, CAPILLARY     Status: Abnormal   Collection Time   02/27/12  8:57 AM      Component Value Range Comment   Glucose-Capillary 114 (*) 70 - 99 mg/dL   HEPARIN LEVEL (UNFRACTIONATED)     Status: Normal   Collection Time   02/27/12 11:06 AM      Component Value Range Comment   Heparin Unfractionated 0.56  0.30 - 0.70 IU/mL   GLUCOSE, CAPILLARY     Status: Normal   Collection Time   02/27/12 12:12 PM      Component Value Range Comment   Glucose-Capillary 94  70 - 99 mg/dL   GLUCOSE, CAPILLARY     Status: Normal   Collection Time   02/27/12  1:50 PM      Component Value Range Comment   Glucose-Capillary 80  70 - 99 mg/dL     Disposition:    Discharge Medications:  Medication List  As of 02/27/2012  2:10 PM   STOP taking these medications         metoprolol succinate 50 MG 24 hr tablet         TAKE these medications  acetaminophen 325 MG tablet   Commonly known as: TYLENOL   Take 2 tablets (650 mg total) by mouth  every 4 (four) hours as needed.      aspirin EC 81 MG tablet   Take 81 mg by mouth daily.      benazepril 10 MG tablet   Commonly known as: LOTENSIN   Take 10 mg by mouth daily.      busPIRone 10 MG tablet   Commonly known as: BUSPAR   Take 10 mg by mouth daily.      clopidogrel 75 MG tablet   Commonly known as: PLAVIX   Take 75 mg by mouth daily.      DEXILANT 60 MG capsule   Generic drug: dexlansoprazole   Take 60 mg by mouth daily.      insulin detemir 100 UNIT/ML injection   Commonly known as: LEVEMIR   Inject 26 Units into the skin at bedtime.      metoprolol tartrate 25 MG tablet   Commonly known as: LOPRESSOR   Take 1 tablet (25 mg total) by mouth every morning.      montelukast 10 MG tablet   Commonly known as: SINGULAIR   Take 10 mg by mouth at bedtime.      nitroGLYCERIN 0.4 MG SL tablet   Commonly known as: NITROSTAT   Place 1 tablet (0.4 mg total) under the tongue every 5 (five) minutes x 3 doses as needed for chest pain.      rosuvastatin 20 MG tablet   Commonly known as: CRESTOR   Take 20 mg by mouth daily.              Duration of Discharge Encounter: Greater than 30 minutes including physician time.  Jolene Provost PA-C 02/27/2012 2:10 PM

## 2012-02-27 NOTE — H&P (Signed)
Pt. Seen and examined. Agree with the NP/PA-C note as written.  Seen and admitted by Dr. Alanda Amass in the office in [place] (see his dictated notee). Known CAD with typical symptoms of unstable angina. Plan LHC in AM.  Chrystie Nose, MD, [facility] Attending Cardiologist [facility]

## 2012-02-27 NOTE — Progress Notes (Signed)
Carles Collet NP paged and advised of HR 36 while sleeping.  Patient asymptomatic at this time. Will continue to closely monitor.

## 2012-02-27 NOTE — H&P (Signed)
  H & P will be scanned in.  Pt was reexamined and existing H & P reviewed. No changes found.  Runell Gess, MD [facility] 02/27/2012 1:37 PM

## 2012-03-15 ENCOUNTER — Emergency Department (HOSPITAL_COMMUNITY): Payer: 59

## 2012-03-15 ENCOUNTER — Encounter (HOSPITAL_COMMUNITY): Payer: Self-pay | Admitting: *Deleted

## 2012-03-15 ENCOUNTER — Emergency Department (HOSPITAL_COMMUNITY)
Admission: EM | Admit: 2012-03-15 | Discharge: 2012-03-15 | Disposition: A | Discharge status: Home / Self Care | Payer: 59 | Attending: Emergency Medicine | Admitting: Emergency Medicine

## 2012-03-15 DIAGNOSIS — Z79899 Other long term (current) drug therapy: Secondary | ICD-10-CM | POA: Insufficient documentation

## 2012-03-15 DIAGNOSIS — I2510 Atherosclerotic heart disease of native coronary artery without angina pectoris: Secondary | ICD-10-CM | POA: Insufficient documentation

## 2012-03-15 DIAGNOSIS — I252 Old myocardial infarction: Secondary | ICD-10-CM | POA: Insufficient documentation

## 2012-03-15 DIAGNOSIS — Z794 Long term (current) use of insulin: Secondary | ICD-10-CM | POA: Insufficient documentation

## 2012-03-15 DIAGNOSIS — W172XXA Fall into hole, initial encounter: Secondary | ICD-10-CM | POA: Insufficient documentation

## 2012-03-15 DIAGNOSIS — Z7902 Long term (current) use of antithrombotics/antiplatelets: Secondary | ICD-10-CM | POA: Insufficient documentation

## 2012-03-15 DIAGNOSIS — S99919A Unspecified injury of unspecified ankle, initial encounter: Secondary | ICD-10-CM | POA: Insufficient documentation

## 2012-03-15 DIAGNOSIS — E119 Type 2 diabetes mellitus without complications: Secondary | ICD-10-CM | POA: Insufficient documentation

## 2012-03-15 DIAGNOSIS — G4730 Sleep apnea, unspecified: Secondary | ICD-10-CM | POA: Insufficient documentation

## 2012-03-15 DIAGNOSIS — Z7982 Long term (current) use of aspirin: Secondary | ICD-10-CM | POA: Insufficient documentation

## 2012-03-15 DIAGNOSIS — I10 Essential (primary) hypertension: Secondary | ICD-10-CM | POA: Insufficient documentation

## 2012-03-15 DIAGNOSIS — S8990XA Unspecified injury of unspecified lower leg, initial encounter: Secondary | ICD-10-CM | POA: Insufficient documentation

## 2012-03-15 DIAGNOSIS — Z87891 Personal history of nicotine dependence: Secondary | ICD-10-CM | POA: Insufficient documentation

## 2012-03-15 DIAGNOSIS — S93402A Sprain of unspecified ligament of left ankle, initial encounter: Secondary | ICD-10-CM

## 2012-03-15 MED ORDER — HYDROCODONE-ACETAMINOPHEN 5-325 MG PO TABS: 1.0000 | ORAL_TABLET | ORAL | Status: DC | PRN

## 2012-03-15 MED ORDER — HYDROCODONE-ACETAMINOPHEN 5-325 MG PO TABS
2.0000 | ORAL_TABLET | Freq: Once | ORAL | Status: AC
  Administered 2012-03-15: 2 via ORAL
  Filled 2012-03-15: qty 2

## 2012-03-15 MED ORDER — ONDANSETRON HCL 4 MG PO TABS
4.0000 mg | ORAL_TABLET | Freq: Once | ORAL | Status: AC
  Administered 2012-03-15: 4 mg via ORAL
  Filled 2012-03-15: qty 1

## 2012-03-15 NOTE — ED Notes (Addendum)
Pt States he tripped into a hole while walking 3 hours ago and twisted his L ankle. Left ankle visibly swollen, states he cannot bear any weight on it. Also notes tenderness to lateral side of L foot. Pedal pulses cap refill WDL

## 2012-03-15 NOTE — ED Provider Notes (Signed)
History     CSN: [id number]  Arrival date & time 03/15/12  1953   First MD Initiated Contact with Patient 03/15/12 2012      Chief Complaint  Patient presents with  . Ankle Injury    (Consider location/radiation/quality/duration/timing/severity/associated sxs/prior treatment) HPI Comments: Patient states he has had back surgery in the past and has foot drop involving the left foot. He usually wears a brace, but today he did not wear her brace. He stepped in a hole and injured the left foot and ankle. He complains of pain in the ankle with pain radiating into the toes. It is of note that the patient is on Plavix 75 mg daily and aspirin 81 mg tablets daily. The patient has not had surgical procedures involving the left ankle. He has not taken any medications for this problem today.  The history is provided by the patient.    Past Medical History  Diagnosis Date  . Diabetes mellitus   . Coronary artery disease   . Myocardial infarction   . Hypertension   . Shortness of breath   . Sleep apnea     Past Surgical History  Procedure Date  . Back surgery 1985  . Cardiac catheterization 2010    6 stents total    History reviewed. No pertinent family history.  History  Substance Use Topics  . Smoking status: Former Smoker -- 1.0 packs/day for 50 years    Quit date: 02/22/2012  . Smokeless tobacco: Not on file  . Alcohol Use: No      Review of Systems  Constitutional: Negative for activity change.       All ROS Neg except as noted in HPI  HENT: Negative for nosebleeds and neck pain.   Eyes: Negative for photophobia and discharge.  Respiratory: Positive for shortness of breath. Negative for cough and wheezing.   Cardiovascular: Positive for chest pain. Negative for palpitations.  Gastrointestinal: Negative for abdominal pain and blood in stool.  Genitourinary: Negative for dysuria, frequency and hematuria.  Musculoskeletal: Positive for back pain. Negative for  arthralgias.  Skin: Negative.   Neurological: Negative for dizziness, seizures and speech difficulty.  Psychiatric/Behavioral: Negative for hallucinations and confusion.    Allergies  Fish allergy and Fish oil  Home Medications   Current Outpatient Rx  Name Route Sig Dispense Refill  . ACETAMINOPHEN 325 MG PO TABS Oral Take 2 tablets (650 mg total) by mouth every 4 (four) hours as needed.    . ASPIRIN EC 81 MG PO TBEC Oral Take 81 mg by mouth daily.    [name] BENAZEPRIL HCL 10 MG PO TABS Oral Take 10 mg by mouth daily.    . BUSPIRONE HCL 10 MG PO TABS Oral Take 10 mg by mouth daily.     [name] CLOPIDOGREL BISULFATE 75 MG PO TABS Oral Take 75 mg by mouth daily.    . DEXLANSOPRAZOLE 60 MG PO CPDR Oral Take 60 mg by mouth daily.    . INSULIN DETEMIR 100 UNIT/ML [place] SOLN Subcutaneous Inject 26 Units into the skin at bedtime.    [name] METOPROLOL TARTRATE 25 MG PO TABS Oral Take 1 tablet (25 mg total) by mouth every morning. 30 tablet 11  . MONTELUKAST SODIUM 10 MG PO TABS Oral Take 10 mg by mouth at bedtime.    [name] NITROGLYCERIN 0.4 MG SL SUBL Sublingual Place 1 tablet (0.4 mg total) under the tongue every 5 (five) minutes x 3 doses as needed for chest pain. 25 tablet  2  . ROSUVASTATIN CALCIUM 20 MG PO TABS Oral Take 20 mg by mouth daily.      BP 112/68  Pulse 75  Temp 98.9 F (37.2 C) (Oral)  Resp 20  Ht 6' (1.829 m)  Wt 236 lb (107.049 kg)  BMI 32.01 kg/m2  SpO2 99%  Physical Exam  Nursing note and vitals reviewed. Constitutional: He is oriented to person, place, and time. He appears well-developed and well-nourished.  Non-toxic appearance.  HENT:  Head: Normocephalic.  Right Ear: Tympanic membrane and external ear normal.  Left Ear: Tympanic membrane and external ear normal.  Eyes: EOM and lids are normal. Pupils are equal, round, and reactive to light.  Neck: Normal range of motion. Neck supple. Carotid bruit is not present.  Cardiovascular: Normal rate, normal heart sounds, intact distal  pulses and normal pulses.        Occasional skipped beat.  Pulmonary/Chest: Breath sounds normal. No respiratory distress.  Abdominal: Soft. Bowel sounds are normal. There is no tenderness. There is no guarding.  Musculoskeletal: Normal range of motion.       There is full range of motion of the left hip and knee. There are degenerative joint disease changes present. There is lateral malleolus swelling noted on the left ankle. The Achilles tendon is intact. Capillary refill is less than 3 seconds. Full range of motion of the toes.  Lymphadenopathy:       Head (right side): No submandibular adenopathy present.       Head (left side): No submandibular adenopathy present.    He has no cervical adenopathy.  Neurological: He is alert and oriented to person, place, and time. He has normal strength. No cranial nerve deficit or sensory deficit.  Skin: Skin is warm and dry.  Psychiatric: He has a normal mood and affect. His speech is normal.    ED Course  Procedures (including critical care time)  Labs Reviewed - No data to display No results found.   No diagnosis found.    MDM  I have reviewed nursing notes, vital signs, and all appropriate lab and imaging results for this patient. Patient usually wears a brace because of foot drop on the left foot. He was not wearing one today, stepped in a hole, and twisted the left ankle. He noticed significant swelling and pain and came to the emergency department for evaluation. The x-ray of the left foot and ankle are negative for fracture or dislocation. The patient is fitted with an ankle stirrup splint, and crutches. Prescription for Norco one every 4 hours as needed for pain #15 tablets given to the patient. The patient is to followup with orthopedics if not improving.       Kathie Dike, [place] 03/15/12 2059

## 2012-03-15 NOTE — ED Provider Notes (Signed)
Medical screening examination/treatment/procedure(s) were performed by non-physician practitioner and as supervising physician I was immediately available for consultation/collaboration.   Reizy Dunlow L Astin Sayre, MD 03/15/12 2251 

## 2012-03-15 NOTE — ED Notes (Signed)
Stepped in a hole and injured lt ankle.

## 2012-08-18 ENCOUNTER — Encounter (HOSPITAL_COMMUNITY): Payer: Self-pay

## 2012-08-18 ENCOUNTER — Emergency Department (HOSPITAL_COMMUNITY): Payer: 59

## 2012-08-18 ENCOUNTER — Observation Stay (HOSPITAL_COMMUNITY)
Admission: EM | Admit: 2012-08-18 | Discharge: 2012-08-19 | Disposition: A | Discharge status: Home / Self Care | Payer: 59 | Attending: Internal Medicine | Admitting: Internal Medicine

## 2012-08-18 DIAGNOSIS — J449 Chronic obstructive pulmonary disease, unspecified: Secondary | ICD-10-CM

## 2012-08-18 DIAGNOSIS — J4489 Other specified chronic obstructive pulmonary disease: Secondary | ICD-10-CM | POA: Insufficient documentation

## 2012-08-18 DIAGNOSIS — E785 Hyperlipidemia, unspecified: Secondary | ICD-10-CM

## 2012-08-18 DIAGNOSIS — Z794 Long term (current) use of insulin: Secondary | ICD-10-CM

## 2012-08-18 DIAGNOSIS — I10 Essential (primary) hypertension: Secondary | ICD-10-CM

## 2012-08-18 DIAGNOSIS — F17200 Nicotine dependence, unspecified, uncomplicated: Secondary | ICD-10-CM

## 2012-08-18 DIAGNOSIS — E119 Type 2 diabetes mellitus without complications: Secondary | ICD-10-CM | POA: Insufficient documentation

## 2012-08-18 DIAGNOSIS — E1143 Type 2 diabetes mellitus with diabetic autonomic (poly)neuropathy: Secondary | ICD-10-CM

## 2012-08-18 DIAGNOSIS — K219 Gastro-esophageal reflux disease without esophagitis: Secondary | ICD-10-CM

## 2012-08-18 DIAGNOSIS — I2510 Atherosclerotic heart disease of native coronary artery without angina pectoris: Secondary | ICD-10-CM

## 2012-08-18 DIAGNOSIS — G4730 Sleep apnea, unspecified: Secondary | ICD-10-CM

## 2012-08-18 DIAGNOSIS — IMO0001 Reserved for inherently not codable concepts without codable children: Secondary | ICD-10-CM

## 2012-08-18 DIAGNOSIS — I200 Unstable angina: Secondary | ICD-10-CM

## 2012-08-18 DIAGNOSIS — R001 Bradycardia, unspecified: Secondary | ICD-10-CM

## 2012-08-18 DIAGNOSIS — Z720 Tobacco use: Secondary | ICD-10-CM

## 2012-08-18 DIAGNOSIS — R079 Chest pain, unspecified: Principal | ICD-10-CM

## 2012-08-18 LAB — CBC WITH DIFFERENTIAL/PLATELET
Basophils Absolute: 0 10*3/uL (ref 0.0–0.1)
Basophils Relative: 0 % (ref 0–1)
HCT: 47.9 % (ref 39.0–52.0)
Hemoglobin: 16.8 g/dL (ref 13.0–17.0)
Lymphocytes Relative: 26 % (ref 12–46)
MCHC: 35.1 g/dL (ref 30.0–36.0)
Monocytes Relative: 5 % (ref 3–12)
Neutro Abs: 5.2 10*3/uL (ref 1.7–7.7)
Neutrophils Relative %: 66 % (ref 43–77)
RDW: 12.9 % (ref 11.5–15.5)
WBC: 7.9 10*3/uL (ref 4.0–10.5)

## 2012-08-18 LAB — COMPREHENSIVE METABOLIC PANEL
AST: 16 U/L (ref 0–37)
BUN: 15 mg/dL (ref 6–23)
CO2: 24 mEq/L (ref 19–32)
Chloride: 101 mEq/L (ref 96–112)
Creatinine, Ser: 0.98 mg/dL (ref 0.50–1.35)
GFR calc Af Amer: >90 mL/min (ref >90)
GFR calc non Af Amer: 86 mL/min — ABNORMAL LOW (ref >90)
Glucose, Bld: 243 mg/dL — ABNORMAL HIGH (ref 70–99)
Total Bilirubin: 0.4 mg/dL (ref 0.3–1.2)

## 2012-08-18 LAB — TROPONIN I: Troponin I: <0.3 ng/mL (ref <0.30)

## 2012-08-18 MED ORDER — IOHEXOL 350 MG/ML SOLN
100.0000 mL | Freq: Once | INTRAVENOUS | Status: AC | PRN
  Administered 2012-08-18: 100 mL via INTRAVENOUS

## 2012-08-18 MED ORDER — SODIUM CHLORIDE 0.9 % IV SOLN
INTRAVENOUS | Status: DC
  Administered 2012-08-18: 21:00:00 via INTRAVENOUS

## 2012-08-18 MED ORDER — MORPHINE SULFATE 2 MG/ML IJ SOLN
2.0000 mg | Freq: Once | INTRAMUSCULAR | Status: AC
  Administered 2012-08-18: 2 mg via INTRAVENOUS
  Filled 2012-08-18: qty 1

## 2012-08-18 MED ORDER — SODIUM CHLORIDE 0.9 % IV BOLUS (SEPSIS)
250.0000 mL | Freq: Once | INTRAVENOUS | Status: AC
  Administered 2012-08-18: 250 mL via INTRAVENOUS

## 2012-08-18 MED ORDER — ONDANSETRON HCL 4 MG/2ML IJ SOLN
4.0000 mg | Freq: Once | INTRAMUSCULAR | Status: AC
  Administered 2012-08-18: 4 mg via INTRAVENOUS
  Filled 2012-08-18: qty 2

## 2012-08-18 NOTE — ED Notes (Signed)
Pt states he is having pain in his left upper back but denies recent injury or strain.  Pt also c/o pain to left arm since yesterday.

## 2012-08-18 NOTE — ED Provider Notes (Signed)
History  This chart was scribed for John Jakes, MD by John [name], ED Scribe. This patient was seen in room [facility]10/[facility]10 and the patient's care was started at 7:23 PM.  CSN: [id number]  Arrival date & time 08/18/12  [id number]   First MD Initiated Contact with Patient 08/18/12 1923      Chief Complaint  Patient presents with  . Back Pain  . Arm Pain    Patient is a 62 y.o. male presenting with back pain. The history is provided by the patient. No language interpreter was used.  Back Pain Pain location: left shoulder  Radiates to: left arm. Onset quality:  Sudden Duration:  36 hours Timing:  Constant Progression:  Waxing and waning Chronicity:  New Ineffective treatments:  Ibuprofen Associated symptoms: no abdominal pain, no chest pain, no dysuria, no fever and no headaches     John [name] is a 62 y.o. male who presents to the Emergency Department complaining of approximately 36 hours of sudden onset, waxing and waning, constant left upper back pain behind the left scapula that radiates down his left arm to his wrist and into his right upper chest with associated nausea that started when he woke up. He denies having discomfort prior to going to sleep 2 days ago. He rates his pain a 2 out of 10 currently. He denies any recent injury or falls. He has a h/o prior MIs, stents and cardiac catheterizations, last one was in 2010, but denies having recurrent chest or back pain. He follows up with his Cardiologist every 6 months, last visit was last week. He reports taking 2 324 mg ASA, one nitroglycerin and two ibuprofen pills with mild improvement in his pain. He currently takes 75 mg plavix and 81 mg ASA daily. He denies emesis, diaphoresis and SOB as associated symptoms. He has a h/o DM, CAD and HTN.   Dr. Dannial [name] is Cardiologist.   Past Medical History  Diagnosis Date  . Diabetes mellitus   . Coronary artery disease   . Myocardial infarction   . Hypertension   . Shortness  of breath   . Sleep apnea     Past Surgical History  Procedure Laterality Date  . Back surgery  1985  . Cardiac catheterization  2010    6 stents total    No family history on file.  History  Substance Use Topics  . Smoking status: Current Every Day Smoker -- 1.00 packs/day for 50 years    Last Attempt to Quit: 02/22/2012  . Smokeless tobacco: Not on file  . Alcohol Use: Yes     Comment: occasionally      Review of Systems  Constitutional: Negative for fever, chills and diaphoresis.  HENT: Negative for congestion, rhinorrhea and neck pain.   Eyes: Negative for visual disturbance.  Respiratory: Negative for cough and shortness of breath.   Cardiovascular: Negative for chest pain.  Gastrointestinal: Positive for nausea. Negative for vomiting, abdominal pain and diarrhea.  Genitourinary: Negative for dysuria and hematuria.  Musculoskeletal: Positive for back pain.  Skin: Negative for rash.  Neurological: Negative for headaches.  Hematological: Does not bruise/bleed easily.  All other systems reviewed and are negative.    Allergies  Fish allergy and Fish oil  Home Medications   Current Outpatient Rx  Name  Route  Sig  Dispense  Refill  . acetaminophen (TYLENOL) 325 MG tablet   Oral   Take 2 tablets (650 mg total) by mouth every 4 (four) hours as  needed.         [name] aspirin EC 81 MG tablet   Oral   Take 81 mg by mouth daily.         . benazepril (LOTENSIN) 5 MG tablet   Oral   Take 5 mg by mouth at bedtime.          . busPIRone (BUSPAR) 10 MG tablet   Oral   Take 10 mg by mouth at bedtime.          . clopidogrel (PLAVIX) 75 MG tablet   Oral   Take 75 mg by mouth daily.         [name] dexlansoprazole (DEXILANT) 60 MG capsule   Oral   Take 60 mg by mouth daily.         [name] ezetimibe (ZETIA) 10 MG tablet   Oral   Take 10 mg by mouth at bedtime.          . insulin detemir (LEVEMIR) 100 UNIT/ML injection   Subcutaneous   Inject 26 Units into the  skin at bedtime.         . metoprolol tartrate (LOPRESSOR) 25 MG tablet   Oral   Take 1 tablet (25 mg total) by mouth every morning.   30 tablet   11   . montelukast (SINGULAIR) 10 MG tablet   Oral   Take 10 mg by mouth at bedtime.         . rosuvastatin (CRESTOR) 20 MG tablet   Oral   Take 20 mg by mouth at bedtime.          . nitroGLYCERIN (NITROSTAT) 0.4 MG SL tablet   Sublingual   Place 1 tablet (0.4 mg total) under the tongue every 5 (five) minutes x 3 doses as needed for chest pain.   25 tablet   2     Triage Vitals: BP 171/86  Pulse 70  Temp(Src) 98 F (36.7 C) (Oral)  Resp 24  Ht 6' (1.829 m)  Wt 240 lb (108.863 kg)  BMI 32.54 kg/m2  SpO2 98%  Physical Exam  Nursing note and vitals reviewed. Constitutional: He is oriented to person, place, and time. He appears well-developed and well-nourished. No distress.  HENT:  Head: Normocephalic and atraumatic.  Mouth/Throat: Oropharynx is clear and moist.  Moist MM  Eyes: Conjunctivae and EOM are normal. Pupils are equal, round, and reactive to light. No scleral icterus.  Neck: Normal range of motion. Neck supple. No tracheal deviation present.  Cardiovascular: Normal rate and regular rhythm.   No murmur heard. Pulmonary/Chest: Effort normal and breath sounds normal. No respiratory distress. He has no wheezes. He has no rales.  Abdominal: Soft. There is no tenderness.  Musculoskeletal: Normal range of motion. He exhibits no edema (no pedal edema).  Neurological: He is alert and oriented to person, place, and time. No cranial nerve deficit.  Pt able to move both sets of fingers and toes  Skin: Skin is warm and dry.  Psychiatric: He has a normal mood and affect. His behavior is normal.    ED Course  Procedures (including critical care time)  DIAGNOSTIC STUDIES: Oxygen Saturation is 98% on room air, normal by my interpretation.    COORDINATION OF CARE: 7:42 PM- I had a extensive conversation with pt about  the need to get medical attention when he has CP due to his h/o prior MIs and stents. Discussed treatment plan which includes CT of chest, CXR, CBC panel and troponin with  pt at bedside and pt agreed to plan. Advised pt that he will be admitted with a possible transfer to [facility] and pt agreed to admission.  Labs Reviewed  COMPREHENSIVE METABOLIC PANEL - Abnormal; Notable for the following:    Glucose, Bld 243 (*)    GFR calc non Af Amer 86 (*)    All other components within normal limits  CBC WITH DIFFERENTIAL  LIPASE, BLOOD  TROPONIN I  TROPONIN I   Dg Chest 2 View  08/18/2012  *RADIOLOGY REPORT*  Clinical Data: Chest pain and shortness of breath.  CHEST - 2 VIEW  Comparison: 02/26/2012.  Findings: Cardiomegaly and central airway thickening are stable. There is no focal airspace disease, edema or significant pleural effusion. There are probable coronary artery stents.  Degenerative changes throughout the thoracic spine are stable.  Telemetry leads overlie the chest.  IMPRESSION: Stable cardiomegaly and probable chronic bronchitis.  No acute cardiopulmonary process identified.   Original Report Authenticated By: Carey Bullocks, M.D.    Ct Angio Chest Pe W/cm &/or Wo Cm  08/18/2012  *RADIOLOGY REPORT*  Clinical Data: Back pain and left arm pain; intermittent right upper chest pain.  CT ANGIOGRAPHY CHEST  Technique:  Multidetector CT imaging of the chest using the standard protocol during bolus administration of intravenous contrast. Multiplanar reconstructed images including MIPs were obtained and reviewed to evaluate the vascular anatomy.  Contrast: OMNIPAQUE IOHEXOL 350 MG/ML SOLN  Comparison: Chest radiograph performed earlier today at 08:22 p.m., and CTA of the chest performed 01/09/2007  Findings: There is no evidence of pulmonary embolus.  A grossly stable lymph node is noted along the right major fissure. The lungs remain clear bilaterally.  There is no evidence of significant focal  consolidation, pleural effusion or pneumothorax. No masses are identified; no abnormal focal contrast enhancement is seen.  Diffuse coronary artery calcifications are seen.  The mediastinum is otherwise unremarkable in appearance.  No mediastinal lymphadenopathy is seen.  No pericardial effusion is identified. No axillary lymphadenopathy is seen.  The thyroid gland is unremarkable in appearance.  The visualized portions of the liver and spleen are unremarkable. A 2.9 cm cyst is noted arising at the upper pole of the right kidney.  Nonspecific perinephric stranding is noted bilaterally. The visualized portions of the gallbladder, pancreas and adrenal glands are within normal limits.  A splenule is again noted at the tail of the pancreas.  No acute osseous abnormalities are seen.  IMPRESSION:  1.  No evidence of pulmonary embolus. 2.  Lungs clear bilaterally. 3.  Diffuse coronary artery calcifications seen. 4.  Right renal cyst noted.   Original Report Authenticated By: Tonia Ghent, M.D.    Results for orders placed during the hospital encounter of 08/18/12  CBC WITH DIFFERENTIAL      Result Value Range   WBC 7.9  4.0 - 10.5 K/uL   RBC 5.40  4.22 - 5.81 MIL/uL   Hemoglobin 16.8  13.0 - 17.0 g/dL   HCT 16.1  09.6 - 04.5 %   MCV 88.7  78.0 - 100.0 fL   MCH 31.1  26.0 - 34.0 pg   MCHC 35.1  30.0 - 36.0 g/dL   RDW 40.9  81.1 - 91.4 %   Platelets 167  150 - 400 K/uL   Neutrophils Relative 66  43 - 77 %   Neutro Abs 5.2  1.7 - 7.7 K/uL   Lymphocytes Relative 26  12 - 46 %   Lymphs Abs 2.1  0.7 - 4.0 K/uL   Monocytes Relative 5  3 - 12 %   Monocytes Absolute 0.4  0.1 - 1.0 K/uL   Eosinophils Relative 3  0 - 5 %   Eosinophils Absolute 0.2  0.0 - 0.7 K/uL   Basophils Relative 0  0 - 1 %   Basophils Absolute 0.0  0.0 - 0.1 K/uL  COMPREHENSIVE METABOLIC PANEL      Result Value Range   Sodium 135  135 - 145 mEq/L   Potassium 3.8  3.5 - 5.1 mEq/L   Chloride 101  96 - 112 mEq/L   CO2 24  19 - 32 mEq/L    Glucose, Bld 243 (*) 70 - 99 mg/dL   BUN 15  6 - 23 mg/dL   Creatinine, Ser 1.61  0.50 - 1.35 mg/dL   Calcium 9.3  8.4 - 09.6 mg/dL   Total Protein 6.1  6.0 - 8.3 g/dL   Albumin 3.5  3.5 - 5.2 g/dL   AST 16  0 - 37 U/L   ALT 23  0 - 53 U/L   Alkaline Phosphatase 82  39 - 117 U/L   Total Bilirubin 0.4  0.3 - 1.2 mg/dL   GFR calc non Af Amer 86 (*) >90 mL/min   GFR calc Af Amer >90  >90 mL/min  LIPASE, BLOOD      Result Value Range   Lipase 16  11 - 59 U/L  TROPONIN I      Result Value Range   Troponin I <0.30  <0.30 ng/mL    Date: 08/18/2012  Rate: 64  Rhythm: normal sinus rhythm  QRS Axis: normal  Intervals: normal  ST/T Wave abnormalities: nonspecific ST/T changes  Conduction Disutrbances:nonspecific intraventricular conduction delay  Narrative Interpretation:   Old EKG Reviewed: unchanged No significant change since 02/27/12   1. Chest pain       MDM   Patient with 36 hours of chest pain is been constant since 6:30 on Saturday morning. Patient's cardiologist is Dr. Alanda Amass with [facility]. Patient still with chest pain upon arrival here. The pain is left-sided abdominal E. In the left back area CT angios negative for dissection or pulmonary embolism. Coronary artery show extensive calcifications. Patient has 6 stents last one was in 2010. Discuss with cardiology on call at [facility] felt that patient requires admission but with the first troponin here being negative he can be admitted up here discussed with hospitalist here they seem to concur they're evaluating the patient now will most likely be admitted here in no consult [facility] tomorrow. Patient's EKG has not suggest T. T wave changes with intraventricular conduction delay but is completely unchanged since 02/27/2012.  Patient's pain is now very intermittent. It was not relieved at home with nitroglycerin Motrin or aspirin but there was some improvement but here  with morphine the pain is gone for the most part.      I personally performed the services described in this documentation, which was scribed in my presence. The recorded information has been reviewed and is accurate.     John Jakes, MD 08/18/12 [phone]

## 2012-08-18 NOTE — ED Notes (Signed)
Pt complains of back pain and left arm pain, also notes some pain in his upper right chest that comes and goes. Pt does have history of past MIs. States taken 2 324 ASA today, also took a nitro last night without relief.

## 2012-08-18 NOTE — ED Notes (Signed)
Pt given glass of water, MD states ok

## 2012-08-19 ENCOUNTER — Encounter (HOSPITAL_COMMUNITY): Payer: Self-pay | Admitting: Internal Medicine

## 2012-08-19 LAB — GLUCOSE, CAPILLARY: Glucose-Capillary: 159 mg/dL — ABNORMAL HIGH (ref 70–99)

## 2012-08-19 LAB — CBC
HCT: 47.4 % (ref 39.0–52.0)
Hemoglobin: 16.3 g/dL (ref 13.0–17.0)
MCH: 30.6 pg (ref 26.0–34.0)
MCV: 88.9 fL (ref 78.0–100.0)
Platelets: 161 10*3/uL (ref 150–400)
RBC: 5.33 MIL/uL (ref 4.22–5.81)
WBC: 8.4 10*3/uL (ref 4.0–10.5)

## 2012-08-19 LAB — COMPREHENSIVE METABOLIC PANEL
AST: 17 U/L (ref 0–37)
CO2: 26 mEq/L (ref 19–32)
Calcium: 8.8 mg/dL (ref 8.4–10.5)
Chloride: 104 mEq/L (ref 96–112)
Creatinine, Ser: 0.94 mg/dL (ref 0.50–1.35)
GFR calc Af Amer: >90 mL/min (ref >90)
GFR calc non Af Amer: 87 mL/min — ABNORMAL LOW (ref >90)
Glucose, Bld: 181 mg/dL — ABNORMAL HIGH (ref 70–99)
Total Bilirubin: 0.5 mg/dL (ref 0.3–1.2)

## 2012-08-19 MED ORDER — ASPIRIN EC 81 MG PO TBEC
81.0000 mg | DELAYED_RELEASE_TABLET | Freq: Every day | ORAL | Status: DC
  Administered 2012-08-19: 81 mg via ORAL
  Filled 2012-08-19: qty 1

## 2012-08-19 MED ORDER — MONTELUKAST SODIUM 10 MG PO TABS
10.0000 mg | ORAL_TABLET | Freq: Every day | ORAL | Status: DC
  Administered 2012-08-19: 10 mg via ORAL
  Filled 2012-08-19: qty 1

## 2012-08-19 MED ORDER — INSULIN DETEMIR 100 UNIT/ML ~~LOC~~ SOLN
SUBCUTANEOUS | Status: AC
  Filled 2012-08-19: qty 10

## 2012-08-19 MED ORDER — EZETIMIBE 10 MG PO TABS
10.0000 mg | ORAL_TABLET | Freq: Every day | ORAL | Status: DC
  Administered 2012-08-19: 10 mg via ORAL
  Filled 2012-08-19: qty 1

## 2012-08-19 MED ORDER — PANTOPRAZOLE SODIUM 40 MG PO TBEC
40.0000 mg | DELAYED_RELEASE_TABLET | Freq: Every day | ORAL | Status: DC
  Administered 2012-08-19: 40 mg via ORAL
  Filled 2012-08-19: qty 1

## 2012-08-19 MED ORDER — BUSPIRONE HCL 5 MG PO TABS
10.0000 mg | ORAL_TABLET | Freq: Every day | ORAL | Status: DC
  Administered 2012-08-19: 10 mg via ORAL
  Filled 2012-08-19: qty 2

## 2012-08-19 MED ORDER — ATORVASTATIN CALCIUM 40 MG PO TABS: 40.0000 mg | ORAL_TABLET | Freq: Every day | ORAL | Status: DC

## 2012-08-19 MED ORDER — BENAZEPRIL HCL 10 MG PO TABS
5.0000 mg | ORAL_TABLET | Freq: Every day | ORAL | Status: DC
  Administered 2012-08-19: 5 mg via ORAL
  Filled 2012-08-19: qty 1

## 2012-08-19 MED ORDER — SODIUM CHLORIDE 0.9 % IJ SOLN: 3.0000 mL | INTRAMUSCULAR | Status: DC | PRN

## 2012-08-19 MED ORDER — INSULIN ASPART 100 UNIT/ML ~~LOC~~ SOLN
0.0000 [IU] | Freq: Three times a day (TID) | SUBCUTANEOUS | Status: DC
  Administered 2012-08-19: 3 [IU] via SUBCUTANEOUS

## 2012-08-19 MED ORDER — ENOXAPARIN SODIUM 40 MG/0.4ML ~~LOC~~ SOLN
40.0000 mg | SUBCUTANEOUS | Status: DC
  Administered 2012-08-19: 40 mg via SUBCUTANEOUS
  Filled 2012-08-19: qty 0.4

## 2012-08-19 MED ORDER — INSULIN ASPART 100 UNIT/ML ~~LOC~~ SOLN: 0.0000 [IU] | Freq: Every day | SUBCUTANEOUS | Status: DC

## 2012-08-19 MED ORDER — METOPROLOL TARTRATE 25 MG PO TABS
25.0000 mg | ORAL_TABLET | Freq: Every morning | ORAL | Status: DC
Start: 2012-08-19 — End: 2012-08-19
  Administered 2012-08-19: 25 mg via ORAL
  Filled 2012-08-19: qty 1

## 2012-08-19 MED ORDER — ACETAMINOPHEN 325 MG PO TABS
650.0000 mg | ORAL_TABLET | ORAL | Status: DC | PRN
  Administered 2012-08-19: 650 mg via ORAL
  Filled 2012-08-19: qty 2

## 2012-08-19 MED ORDER — ALBUTEROL SULFATE (5 MG/ML) 0.5% IN NEBU: 2.5000 mg | INHALATION_SOLUTION | RESPIRATORY_TRACT | Status: DC | PRN

## 2012-08-19 MED ORDER — ONDANSETRON HCL 4 MG/2ML IJ SOLN: 4.0000 mg | Freq: Four times a day (QID) | INTRAMUSCULAR | Status: DC | PRN

## 2012-08-19 MED ORDER — SODIUM CHLORIDE 0.9 % IJ SOLN
3.0000 mL | Freq: Two times a day (BID) | INTRAMUSCULAR | Status: DC
  Administered 2012-08-19 (×2): 3 mL via INTRAVENOUS

## 2012-08-19 MED ORDER — SODIUM CHLORIDE 0.9 % IJ SOLN: 3.0000 mL | Freq: Two times a day (BID) | INTRAMUSCULAR | Status: DC

## 2012-08-19 MED ORDER — ONDANSETRON HCL 4 MG PO TABS: 4.0000 mg | ORAL_TABLET | Freq: Four times a day (QID) | ORAL | Status: DC | PRN

## 2012-08-19 MED ORDER — CYCLOBENZAPRINE HCL 5 MG PO TABS: 5.0000 mg | ORAL_TABLET | Freq: Two times a day (BID) | ORAL | Status: DC | PRN

## 2012-08-19 MED ORDER — CLOPIDOGREL BISULFATE 75 MG PO TABS
75.0000 mg | ORAL_TABLET | Freq: Every day | ORAL | Status: DC
  Administered 2012-08-19: 75 mg via ORAL
  Filled 2012-08-19: qty 1

## 2012-08-19 MED ORDER — INSULIN DETEMIR 100 UNIT/ML ~~LOC~~ SOLN
26.0000 [IU] | Freq: Every day | SUBCUTANEOUS | Status: DC
  Administered 2012-08-19: 26 [IU] via SUBCUTANEOUS
  Filled 2012-08-19: qty 10

## 2012-08-19 MED ORDER — CYCLOBENZAPRINE HCL 10 MG PO TABS
5.0000 mg | ORAL_TABLET | Freq: Once | ORAL | Status: AC
  Administered 2012-08-19: 5 mg via ORAL
  Filled 2012-08-19: qty 1

## 2012-08-19 MED ORDER — SODIUM CHLORIDE 0.9 % IV SOLN: 250.0000 mL | INTRAVENOUS | Status: DC | PRN

## 2012-08-19 NOTE — Progress Notes (Signed)
UR Chart Review Completed  

## 2012-08-19 NOTE — Consult Note (Signed)
Reason for Consult: Chest and back discomfort History of known coronary artery disease Referring Physician: Dr. Lynnea Ferrier and a [facility] hospitalist service  John [name] is an 62 y.o. male.    Chief Complaint: Atypical left back and left upper extremity discomfort of 2 days duration  HPI: 2 days prior to admission he had some left back discomfort that was vague and some left upper extremity discomfort which he felt was related to sleeping on that side. No significant reflux symptoms and no substernal chest pain. Undergone that was not relieved in intensity it was cardiac. At the urgency of his wife he was seen in the emergency room yesterday for evaluation. Because of his extensive coronary history despite the fact that they felt this was a typical chest pain he was admitted for observation which the patient reluctantly agreed to  Since that time he has been stable and in the hospitalization he has had negative cardiac enzymes no change on his EKGs and he has been stable clinically. Is also had a negative CT of the chest to rule out pulmonary embolus or pulmonary disease. This showed coronary calcifications which was known.   He was revaluated in September for recurrent chest pain was difficult to tell if this was reflux or not. He is on multiple prior problems PCI's with subacute thrombosis that were probably technically related and in the past on Ticlid therapy. Last treatments have been done with IIb IIIa inhibitors is a long-term patient of mine he is a pleasant 62 year old white married father of 3 with 1 C. 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 He does a lot of driving with his work there  He is a complex coronary history with multiple prior intervention and mild LV dysfunction.  #1 extensive coronary disease dating back to 1995.  Emergency CX-OD 9 DES stenting in the setting of acute posterior MI in 1995 there are 2 RCA 9 DES large stent 1996.   #3 repeat revascularization and RCA and interventions 2000 2004 with large DES stents placed in the right and balloon dilatation fly SOR 2001 .  Number4 required further Lawrence 4.5 cm stent for focal in-stent restenosis 2004   #5-HPI: Kaylin repeat PCI June of 2010 with 409 DS driver stent placement which in the mid RCA with remainder of his stents patent and no significant left coronary disease. #6 last catheterization was 02/27/2012 with widely patent stents in the proximal RCA across the acute margin with 40-50% narrowed beyond this no significant left coronary disease and patent CX OD stent from remote intervention.  Prior procedures were done with the IIb IIIa inhibitors because of subacute thrombosis are probably technically related in the past he was also on Ticlid therapy then per his last intervention was done with Plavix without any problems.  He has a history of obstructive sleep apnea on CPAP exogenous obesity and insulin-dependent diabetes in the care of Dr. Manson Passey.  Past Medical History  Diagnosis Date  . Diabetes mellitus   . Coronary artery disease   . Myocardial infarction   . Hypertension   . Shortness of breath   . Sleep apnea     Past Surgical History  Procedure Laterality Date  . Back surgery  1985  . Cardiac catheterization  2010    6 stents total    Family History  Problem  Relation Age of Onset  . Heart attack Father    Social History:  reports that he has been smoking.  He does not have any smokeless tobacco history on file. He reports that  drinks alcohol. He reports that he does not use illicit drugs.  Allergies:  Allergies  Allergen Reactions  . Fish Allergy Itching  . Fish Oil     Medications Prior to Admission  Medication Sig Dispense Refill  . acetaminophen (TYLENOL) 325 MG tablet Take 2 tablets (650 mg total) by mouth every 4  (four) hours as needed.      [name] aspirin EC 81 MG tablet Take 81 mg by mouth daily.      . benazepril (LOTENSIN) 5 MG tablet Take 5 mg by mouth at bedtime.       . busPIRone (BUSPAR) 10 MG tablet Take 10 mg by mouth at bedtime.       . clopidogrel (PLAVIX) 75 MG tablet Take 75 mg by mouth daily.      [name] dexlansoprazole (DEXILANT) 60 MG capsule Take 60 mg by mouth daily.      [name] ezetimibe (ZETIA) 10 MG tablet Take 10 mg by mouth at bedtime.       . insulin detemir (LEVEMIR) 100 UNIT/ML injection Inject 26 Units into the skin at bedtime.      . metoprolol tartrate (LOPRESSOR) 25 MG tablet Take 1 tablet (25 mg total) by mouth every morning.  30 tablet  11  . montelukast (SINGULAIR) 10 MG tablet Take 10 mg by mouth at bedtime.      . rosuvastatin (CRESTOR) 20 MG tablet Take 20 mg by mouth at bedtime.       . nitroGLYCERIN (NITROSTAT) 0.4 MG SL tablet Place 1 tablet (0.4 mg total) under the tongue every 5 (five) minutes x 3 doses as needed for chest pain.  25 tablet  2    Results for orders placed during the hospital encounter of 08/18/12 (from the past 48 hour(s))  CBC WITH DIFFERENTIAL     Status: None   Collection Time    08/18/12  7:45 PM      Result Value Range   WBC 7.9  4.0 - 10.5 K/uL   RBC 5.40  4.22 - 5.81 MIL/uL   Hemoglobin 16.8  13.0 - 17.0 g/dL   HCT 29.5  62.1 - 30.8 %   MCV 88.7  78.0 - 100.0 fL   MCH 31.1  26.0 - 34.0 pg   MCHC 35.1  30.0 - 36.0 g/dL   RDW 65.7  84.6 - 96.2 %   Platelets 167  150 - 400 K/uL   Neutrophils Relative 66  43 - 77 %   Neutro Abs 5.2  1.7 - 7.7 K/uL   Lymphocytes Relative 26  12 - 46 %   Lymphs Abs 2.1  0.7 - 4.0 K/uL   Monocytes Relative 5  3 - 12 %   Monocytes Absolute 0.4  0.1 - 1.0 K/uL   Eosinophils Relative 3  0 - 5 %   Eosinophils Absolute 0.2  0.0 - 0.7 K/uL   Basophils Relative 0  0 - 1 %   Basophils Absolute 0.0  0.0 - 0.1 K/uL  COMPREHENSIVE METABOLIC PANEL     Status: Abnormal   Collection Time    08/18/12  7:45 PM      Result  Value Range   Sodium 135  135 - 145 mEq/L   Potassium 3.8  3.5 - 5.1 mEq/L  Chloride 101  96 - 112 mEq/L   CO2 24  19 - 32 mEq/L   Glucose, Bld 243 (*) 70 - 99 mg/dL   BUN 15  6 - 23 mg/dL   Creatinine, Ser 1.61  0.50 - 1.35 mg/dL   Calcium 9.3  8.4 - 09.6 mg/dL   Total Protein 6.1  6.0 - 8.3 g/dL   Albumin 3.5  3.5 - 5.2 g/dL   AST 16  0 - 37 U/L   ALT 23  0 - 53 U/L   Alkaline Phosphatase 82  39 - 117 U/L   Total Bilirubin 0.4  0.3 - 1.2 mg/dL   GFR calc non Af Amer 86 (*) >90 mL/min   GFR calc Af Amer >90  >90 mL/min   Comment:            The eGFR has been calculated     using the CKD EPI equation.     This calculation has not been     validated in all clinical     situations.     eGFR's persistently     <90 mL/min signify     possible Chronic Kidney Disease.  LIPASE, BLOOD     Status: None   Collection Time    08/18/12  7:45 PM      Result Value Range   Lipase 16  11 - 59 U/L  TROPONIN I     Status: None   Collection Time    08/18/12  7:45 PM      Result Value Range   Troponin I <0.30  <0.30 ng/mL   Comment:            Due to the release kinetics of cTnI,     a negative result within the first hours     of the onset of symptoms does not rule out     myocardial infarction with certainty.     If myocardial infarction is still suspected,     repeat the test at appropriate intervals.  TROPONIN I     Status: None   Collection Time    08/18/12 11:22 PM      Result Value Range   Troponin I <0.30  <0.30 ng/mL   Comment:            Due to the release kinetics of cTnI,     a negative result within the first hours     of the onset of symptoms does not rule out     myocardial infarction with certainty.     If myocardial infarction is still suspected,     repeat the test at appropriate intervals.  GLUCOSE, CAPILLARY     Status: Abnormal   Collection Time    08/19/12  1:17 AM      Result Value Range   Glucose-Capillary 125 (*) 70 - 99 mg/dL  TROPONIN I     Status:  None   Collection Time    08/19/12  5:08 AM      Result Value Range   Troponin I <0.30  <0.30 ng/mL   Comment:            Due to the release kinetics of cTnI,     a negative result within the first hours     of the onset of symptoms does not rule out     myocardial infarction with certainty.     If myocardial infarction is still suspected,     repeat the  test at appropriate intervals.  CBC     Status: None   Collection Time    08/19/12  5:08 AM      Result Value Range   WBC 8.4  4.0 - 10.5 K/uL   RBC 5.33  4.22 - 5.81 MIL/uL   Hemoglobin 16.3  13.0 - 17.0 g/dL   HCT 16.1  09.6 - 04.5 %   MCV 88.9  78.0 - 100.0 fL   MCH 30.6  26.0 - 34.0 pg   MCHC 34.4  30.0 - 36.0 g/dL   RDW 40.9  81.1 - 91.4 %   Platelets 161  150 - 400 K/uL  COMPREHENSIVE METABOLIC PANEL     Status: Abnormal   Collection Time    08/19/12  5:08 AM      Result Value Range   Sodium 138  135 - 145 mEq/L   Potassium 3.8  3.5 - 5.1 mEq/L   Chloride 104  96 - 112 mEq/L   CO2 26  19 - 32 mEq/L   Glucose, Bld 181 (*) 70 - 99 mg/dL   BUN 14  6 - 23 mg/dL   Creatinine, Ser 7.82  0.50 - 1.35 mg/dL   Calcium 8.8  8.4 - 95.6 mg/dL   Total Protein 6.1  6.0 - 8.3 g/dL   Albumin 3.4 (*) 3.5 - 5.2 g/dL   AST 17  0 - 37 U/L   ALT 22  0 - 53 U/L   Alkaline Phosphatase 80  39 - 117 U/L   Total Bilirubin 0.5  0.3 - 1.2 mg/dL   GFR calc non Af Amer 87 (*) >90 mL/min   GFR calc Af Amer >90  >90 mL/min   Comment:            The eGFR has been calculated     using the CKD EPI equation.     This calculation has not been     validated in all clinical     situations.     eGFR's persistently     <90 mL/min signify     possible Chronic Kidney Disease.  GLUCOSE, CAPILLARY     Status: Abnormal   Collection Time    08/19/12  7:54 AM      Result Value Range   Glucose-Capillary 159 (*) 70 - 99 mg/dL   Comment 1 Notify RN    GLUCOSE, CAPILLARY     Status: Abnormal   Collection Time    08/19/12 12:03 PM      Result Value  Range   Glucose-Capillary 108 (*) 70 - 99 mg/dL   Comment 1 Notify RN     Dg Chest 2 View  08/18/2012  *RADIOLOGY REPORT*  Clinical Data: Chest pain and shortness of breath.  CHEST - 2 VIEW  Comparison: 02/26/2012.  Findings: Cardiomegaly and central airway thickening are stable. There is no focal airspace disease, edema or significant pleural effusion. There are probable coronary artery stents.  Degenerative changes throughout the thoracic spine are stable.  Telemetry leads overlie the chest.  IMPRESSION: Stable cardiomegaly and probable chronic bronchitis.  No acute cardiopulmonary process identified.   Original Report Authenticated By: Carey Bullocks, M.D.    Ct Angio Chest Pe W/cm &/or Wo Cm  08/18/2012  *RADIOLOGY REPORT*  Clinical Data: Back pain and left arm pain; intermittent right upper chest pain.  CT ANGIOGRAPHY CHEST  Technique:  Multidetector CT imaging of the chest using the standard protocol during bolus administration of intravenous contrast. Multiplanar  reconstructed images including MIPs were obtained and reviewed to evaluate the vascular anatomy.  Contrast: OMNIPAQUE IOHEXOL 350 MG/ML SOLN  Comparison: Chest radiograph performed earlier today at 08:22 p.m., and CTA of the chest performed 01/09/2007  Findings: There is no evidence of pulmonary embolus.  A grossly stable lymph node is noted along the right major fissure. The lungs remain clear bilaterally.  There is no evidence of significant focal consolidation, pleural effusion or pneumothorax. No masses are identified; no abnormal focal contrast enhancement is seen.  Diffuse coronary artery calcifications are seen.  The mediastinum is otherwise unremarkable in appearance.  No mediastinal lymphadenopathy is seen.  No pericardial effusion is identified. No axillary lymphadenopathy is seen.  The thyroid gland is unremarkable in appearance.  The visualized portions of the liver and spleen are unremarkable. A 2.9 cm cyst is noted arising  at the upper pole of the right kidney.  Nonspecific perinephric stranding is noted bilaterally. The visualized portions of the gallbladder, pancreas and adrenal glands are within normal limits.  A splenule is again noted at the tail of the pancreas.  No acute osseous abnormalities are seen.  IMPRESSION:  1.  No evidence of pulmonary embolus. 2.  Lungs clear bilaterally. 3.  Diffuse coronary artery calcifications seen. 4.  Right renal cyst noted.   Original Report Authenticated By: Tonia Ghent, M.D.     ROS: History of GERDBlood pressure 123/72, pulse 64, temperature 97.8 F (36.6 C), temperature source Oral, resp. rate 20, height 6' (1.829 m), weight 110.315 kg (243 lb 3.2 oz), SpO2 98.00%. stable at present. The remainder of the scope orders are systems unremarkable other than outlined above recent past lumbar disc surgery and he has a left foot drop and wears a chronic Kevlar placed brace and does well with that. PE: On exam he is euthyroid appearing white male blood pressure is 125/70 he is euthyroid appearing skin is warm and dry he is afebrile and not complaining of any chest or arm discomfort present. There are no significant carotid bruits thyroid not enlarged. He is a large man with the chest. The PMI is in the left fifth ICS in the [facility] felt as a localized heave there is an S4 no parasternal lift and a short benign sounding 1/6 systolic murmur at the left sternal border no diastolic murmur or rubs no S3 JVD or a chair reflux  She's has a left foot drop which is chronic pedal pulses are intact is no edema. There is no rash tremor cute arthritis skin is warm and dry. He is comfortable 10-20. Ankle reflexes are absent there are no pathologic reflexes  Assessment/Plan I agree with the hospital is evaluation. His chest pain is atypical and he has had most recent Most recent in 02/27/2012 which showed widely patent patent extensive stents in the proximal RCA and into the distal RCA. 40-50% narrowing  beyond his previously placed stents no significant left coronary disease per is also has a patent stent to his circumflex artery OD and a nondominant circumflex system  Laboratory done as an outpatient on 08/13/2012 shows an H&H 17/48 mild polycythemia which is unchanged normal platelet count 176. Glucose 165 BUN/creatinine 14/0.95. Cholesterol 88 LDL 45 HDL and normal TSH Z6X of 8.5.  I believe it is okay to discharge him today he is clinically stable and lives rule that as outlined above. I will be happy to see him back as an outpatient and followup at his next regular appointment or sooner if he  has any continued or or different symptoms. He is been a very reliable and compliant patient. Have also stressed to him the importance of discontinuing cigarette smoking. Please call if there are any further questions or problems  [name], John A 08/19/2012, 12:22 PM    Pt. Seen and examined. Agree with Nada Boozer NP note as written

## 2012-08-19 NOTE — Discharge Summary (Addendum)
Physician Discharge Summary  [name]:[id number] DOB: [birth year] DOA: 08/18/2012  PCP: Leo Grosser, MD  Admit date: 08/18/2012 Discharge date: 08/19/2012  Time spent: 40 minutes minutes  Recommendations for Outpatient Follow-up:  1. Follow up with PCP in 1 week 2. Take muscle relaxer as directed  Discharge Diagnoses:  Principal Problem:   Chest pain Active Problems:   CAD, multiple prior RCA PCI's. Last cath 2010, Myoview low risk Oct 2012   IDDM (insulin dependent diabetes mellitus)   HTN (hypertension)   Sleep apnea, on C-pap   COPD (chronic obstructive pulmonary disease)   Tobacco abuse   Discharge Condition: stable  Diet recommendation: heart healthy  Filed Weights   08/18/12 1919 08/19/12 0126  Weight: 108.863 kg (240 lb) 110.315 kg (243 lb 3.2 oz)    History of present illness:  John [name] is a 62 y.o. male with a past medical history of diabetes, coronary artery disease, with multiple stents, hypertension, OSA, tobacco abuse, who was in his usual state of health till on 08/17/12 when he started developing back pain, and chest pain on the left side. He came to ED on 3/314 early am with same complaint. He reports that he took ibuprofen for his pain. Did not have any relief. He tried moving from his couch to his chair to his bed. No change in the pain was noted. Had a slight cough, but no fever. Denied any shortness of breath. Had some nausea before his supper last night. No dizziness. Denied any leg swelling. And, then he had some pain in the left arm as well. When he was getting a CT scan at admission he was asked to lift his arms and he experienced a slight twinge in his back and his chest. We were asked to admit Hospital Course:  1 chest pain: Troponin negative x3. So, this is unlikely to be acute coronary syndrome. EKG NSR without changes; occasional PVC. Likely musculoskeletal given pain can be reproduced, is quite localized and fair relief with muscle  relaxer. Pt also reports intermittent decreased rom to neck.  Will discharge with muscle relaxer. Recommend follow up with PCP of OP work up of cervical issues.    #2 history of significant coronary artery disease: Please see above. Continue with his beta blocker antiplatelet agents.   #3 history of insulin-dependent diabetes:  A1c 7.3 9/13. CBG range 125- 159 during hospitalization. Will resume home regimen.    #4 history of sleep apnea: Continue with CPAP.   #5 history of hypertension: Fair control. SBP range 123-150 during hospitalization. Will continue his anti hypertensive agents at discharge.      Procedures:  none  Consultations:  none  Discharge Exam: Filed Vitals:   08/19/12 0126 08/19/12 0405 08/19/12 0630 08/19/12 1035  BP: 150/93  123/72   Pulse: 62 72 57 64  Temp: 97.6 F (36.4 C)  97.8 F (36.6 C)   TempSrc: Oral  Oral   Resp: 20 20 20    Height: 6' (1.829 m)     Weight: 110.315 kg (243 lb 3.2 oz)     SpO2: 98% 98% 98%     General: Up in chair, alert NAD Cardiovascular: RRR No MGR No LEE Respiratory: normal effort BSCTAB no wheeze/rhonchi  Discharge Instructions     Medication List    TAKE these medications       acetaminophen 325 MG tablet  Commonly known as:  TYLENOL  Take 2 tablets (650 mg total) by mouth every 4 (four)  hours as needed.     aspirin EC 81 MG tablet  Take 81 mg by mouth daily.     benazepril 5 MG tablet  Commonly known as:  LOTENSIN  Take 5 mg by mouth at bedtime.     busPIRone 10 MG tablet  Commonly known as:  BUSPAR  Take 10 mg by mouth at bedtime.     clopidogrel 75 MG tablet  Commonly known as:  PLAVIX  Take 75 mg by mouth daily.     cyclobenzaprine 5 MG tablet  Commonly known as:  FLEXERIL  Take 1 tablet (5 mg total) by mouth 2 (two) times daily as needed for muscle spasms.     DEXILANT 60 MG capsule  Generic drug:  dexlansoprazole  Take 60 mg by mouth daily.     ezetimibe 10 MG tablet  Commonly known as:   ZETIA  Take 10 mg by mouth at bedtime.     insulin detemir 100 UNIT/ML injection  Commonly known as:  LEVEMIR  Inject 26 Units into the skin at bedtime.     metoprolol tartrate 25 MG tablet  Commonly known as:  LOPRESSOR  Take 1 tablet (25 mg total) by mouth every morning.     montelukast 10 MG tablet  Commonly known as:  SINGULAIR  Take 10 mg by mouth at bedtime.     nitroGLYCERIN 0.4 MG SL tablet  Commonly known as:  NITROSTAT  Place 1 tablet (0.4 mg total) under the tongue every 5 (five) minutes x 3 doses as needed for chest pain.     rosuvastatin 20 MG tablet  Commonly known as:  CRESTOR  Take 20 mg by mouth at bedtime.          The results of significant diagnostics from this hospitalization (including imaging, microbiology, ancillary and laboratory) are listed below for reference.    Significant Diagnostic Studies: Dg Chest 2 View  08/18/2012  *RADIOLOGY REPORT*  Clinical Data: Chest pain and shortness of breath.  CHEST - 2 VIEW  Comparison: 02/26/2012.  Findings: Cardiomegaly and central airway thickening are stable. There is no focal airspace disease, edema or significant pleural effusion. There are probable coronary artery stents.  Degenerative changes throughout the thoracic spine are stable.  Telemetry leads overlie the chest.  IMPRESSION: Stable cardiomegaly and probable chronic bronchitis.  No acute cardiopulmonary process identified.   Original Report Authenticated By: Carey Bullocks, M.D.    Ct Angio Chest Pe W/cm &/or Wo Cm  08/18/2012  *RADIOLOGY REPORT*  Clinical Data: Back pain and left arm pain; intermittent right upper chest pain.  CT ANGIOGRAPHY CHEST  Technique:  Multidetector CT imaging of the chest using the standard protocol during bolus administration of intravenous contrast. Multiplanar reconstructed images including MIPs were obtained and reviewed to evaluate the vascular anatomy.  Contrast: OMNIPAQUE IOHEXOL 350 MG/ML SOLN  Comparison: Chest  radiograph performed earlier today at 08:22 p.m., and CTA of the chest performed 01/09/2007  Findings: There is no evidence of pulmonary embolus.  A grossly stable lymph node is noted along the right major fissure. The lungs remain clear bilaterally.  There is no evidence of significant focal consolidation, pleural effusion or pneumothorax. No masses are identified; no abnormal focal contrast enhancement is seen.  Diffuse coronary artery calcifications are seen.  The mediastinum is otherwise unremarkable in appearance.  No mediastinal lymphadenopathy is seen.  No pericardial effusion is identified. No axillary lymphadenopathy is seen.  The thyroid gland is unremarkable in appearance.  The  visualized portions of the liver and spleen are unremarkable. A 2.9 cm cyst is noted arising at the upper pole of the right kidney.  Nonspecific perinephric stranding is noted bilaterally. The visualized portions of the gallbladder, pancreas and adrenal glands are within normal limits.  A splenule is again noted at the tail of the pancreas.  No acute osseous abnormalities are seen.  IMPRESSION:  1.  No evidence of pulmonary embolus. 2.  Lungs clear bilaterally. 3.  Diffuse coronary artery calcifications seen. 4.  Right renal cyst noted.   Original Report Authenticated By: Tonia Ghent, M.D.     Microbiology: No results found for this or any previous visit (from the past 240 hour(s)).   Labs: Basic Metabolic Panel:  Recent Labs Lab 08/18/12 1945 08/19/12 0508  NA 135 138  K 3.8 3.8  CL 101 104  CO2 24 26  GLUCOSE 243* 181*  BUN 15 14  CREATININE 0.98 0.94  CALCIUM 9.3 8.8   Liver Function Tests:  Recent Labs Lab 08/18/12 1945 08/19/12 0508  AST 16 17  ALT 23 22  ALKPHOS 82 80  BILITOT 0.4 0.5  PROT 6.1 6.1  ALBUMIN 3.5 3.4*    Recent Labs Lab 08/18/12 1945  LIPASE 16   No results found for this basename: AMMONIA,  in the last 168 hours CBC:  Recent Labs Lab 08/18/12 1945 08/19/12 0508   WBC 7.9 8.4  NEUTROABS 5.2  --   HGB 16.8 16.3  HCT 47.9 47.4  MCV 88.7 88.9  PLT 167 161   Cardiac Enzymes:  Recent Labs Lab 08/18/12 1945 08/18/12 2322 08/19/12 0508  TROPONINI <0.30 <0.30 <0.30   BNP: BNP (last 3 results) No results found for this basename: PROBNP,  in the last 8760 hours CBG:  Recent Labs Lab 08/19/12 0117 08/19/12 0754  GLUCAP 125* 159*       Signed:  Gwenyth Bender  [facility] 08/19/2012, 11:47 AM  Crista Curb, M.D.

## 2012-08-19 NOTE — H&P (Signed)
[facility] History and Physical  [name] ZOX:[id number] DOB: [birth year] DOA: 08/18/2012   PCP: Leo Grosser, MD  Specialists: Dr. Alanda Amass is his cardiologist  Chief Complaint: Chest pain, and back pain since Saturday  HPI: John [name] is a 62 y.o. male with a past medical history of diabetes, coronary artery disease, with multiple stents, hypertension, OSA, tobacco abuse, who was in his usual state of health till on Saturday when he started developing back pain, and chest pain on the left side. Patient is a poor historian. He tends to give me the reason why he thinks he might be having the chest pain, rather than describing his symptoms. He tells me that he took ibuprofen for his pain. Did not have any relief. He tried moving from his couch to his chair to his bed. No change in the pain was noted. Had a slight cough, but no fever. Denies any shortness of breath. Had some nausea before his supper last night. No dizziness. Denies any leg swelling. And, then he had some pain in the left arm as well. When he was getting a CT scan tonight he was asked to lift his arms and he experienced a slight twinge in his back and his chest. Patient was very reluctant to stay in the hospital and so, I think he was trying to convey that there was nothing wrong with him. He, at the time of my assessment, denied any chest pain. History is limited.  Home Medications: Prior to Admission medications   Medication Sig Start Date End Date Taking? Authorizing Provider  acetaminophen (TYLENOL) 325 MG tablet Take 2 tablets (650 mg total) by mouth every 4 (four) hours as needed. 02/27/12 02/26/13 Yes Abelino Derrick, PA  aspirin EC 81 MG tablet Take 81 mg by mouth daily.   Yes Historical Provider, MD  benazepril (LOTENSIN) 5 MG tablet Take 5 mg by mouth at bedtime.    Yes Historical Provider, MD  busPIRone (BUSPAR) 10 MG tablet Take 10 mg by mouth at bedtime.    Yes Historical Provider, MD  clopidogrel  (PLAVIX) 75 MG tablet Take 75 mg by mouth daily.   Yes Historical Provider, MD  dexlansoprazole (DEXILANT) 60 MG capsule Take 60 mg by mouth daily.   Yes Historical Provider, MD  ezetimibe (ZETIA) 10 MG tablet Take 10 mg by mouth at bedtime.    Yes Historical Provider, MD  insulin detemir (LEVEMIR) 100 UNIT/ML injection Inject 26 Units into the skin at bedtime.   Yes Historical Provider, MD  metoprolol tartrate (LOPRESSOR) 25 MG tablet Take 1 tablet (25 mg total) by mouth every morning. 02/27/12 02/26/13 Yes Luke K Kilroy, PA  montelukast (SINGULAIR) 10 MG tablet Take 10 mg by mouth at bedtime.   Yes Historical Provider, MD  rosuvastatin (CRESTOR) 20 MG tablet Take 20 mg by mouth at bedtime.    Yes Historical Provider, MD  nitroGLYCERIN (NITROSTAT) 0.4 MG SL tablet Place 1 tablet (0.4 mg total) under the tongue every 5 (five) minutes x 3 doses as needed for chest pain. 02/27/12 02/26/13  Abelino Derrick, PA    Allergies:  Allergies  Allergen Reactions  . Fish Allergy Itching  . Fish Oil     Past Medical History: Past Medical History  Diagnosis Date  . Diabetes mellitus   . Coronary artery disease   . Myocardial infarction   . Hypertension   . Shortness of breath   . Sleep apnea     Past Surgical History  Procedure Laterality Date  . Back surgery  1985  . Cardiac catheterization  2010    6 stents total    Social History:  reports that he has been smoking.  He does not have any smokeless tobacco history on file. He reports that  drinks alcohol. He reports that he does not use illicit drugs.  Living Situation: Lives with his wife in [place] Activity Level: Usually independent with daily activities   Family History:  Family History  Problem Relation Age of Onset  . Heart attack Father      Review of Systems - History obtained from the patient General ROS: negative Psychological ROS: negative Ophthalmic ROS: negative ENT ROS: negative Allergy and Immunology ROS:  negative Hematological and Lymphatic ROS: negative Endocrine ROS: negative Respiratory ROS: no cough, shortness of breath, or wheezing Cardiovascular ROS: as in hpi Gastrointestinal ROS: no abdominal pain, change in bowel habits, or black or bloody stools Genito-Urinary ROS: no dysuria, trouble voiding, or hematuria Musculoskeletal ROS: negative Neurological ROS: no TIA or stroke symptoms Dermatological ROS: negative  Physical Examination  Filed Vitals:   08/18/12 1919 08/18/12 2000 08/18/12 2030 08/18/12 2200  BP: 171/86 164/78  150/86  Pulse: 70 66 62 57  Temp: 98 F (36.7 C)     TempSrc: Oral     Resp: 24 17 18 19   Height: 6' (1.829 m)     Weight: 108.863 kg (240 lb)     SpO2: 98% 96% 95% 94%    General appearance: alert, cooperative, appears stated age and no distress Head: Normocephalic, without obvious abnormality, atraumatic Eyes: conjunctivae/corneas clear. PERRL, EOM's intact.  Throat: lips, mucosa, and tongue normal; teeth and gums normal Neck: no adenopathy, no carotid bruit, no JVD, supple, symmetrical, trachea midline and thyroid not enlarged, symmetric, no tenderness/mass/nodules Back: symmetric, no curvature. ROM normal. No CVA tenderness. Resp: clear to auscultation bilaterally Cardio: regular rate and rhythm, S1, S2 normal, no murmur, click, rub or gallop GI: soft, non-tender; bowel sounds normal; no masses,  no organomegaly Extremities: extremities normal, atraumatic, no cyanosis or edema Pulses: 2+ and symmetric Skin: Skin color, texture, turgor normal. No rashes or lesions Lymph nodes: Cervical, supraclavicular, and axillary nodes normal. Neurologic: Alert and oriented x3. No focal neurological deficits are present  Laboratory Data: Results for orders placed during the hospital encounter of 08/18/12 (from the past 48 hour(s))  CBC WITH DIFFERENTIAL     Status: None   Collection Time    08/18/12  7:45 PM      Result Value Range   WBC 7.9  4.0 - 10.5  K/uL   RBC 5.40  4.22 - 5.81 MIL/uL   Hemoglobin 16.8  13.0 - 17.0 g/dL   HCT 16.1  09.6 - 04.5 %   MCV 88.7  78.0 - 100.0 fL   MCH 31.1  26.0 - 34.0 pg   MCHC 35.1  30.0 - 36.0 g/dL   RDW 40.9  81.1 - 91.4 %   Platelets 167  150 - 400 K/uL   Neutrophils Relative 66  43 - 77 %   Neutro Abs 5.2  1.7 - 7.7 K/uL   Lymphocytes Relative 26  12 - 46 %   Lymphs Abs 2.1  0.7 - 4.0 K/uL   Monocytes Relative 5  3 - 12 %   Monocytes Absolute 0.4  0.1 - 1.0 K/uL   Eosinophils Relative 3  0 - 5 %   Eosinophils Absolute 0.2  0.0 - 0.7 K/uL  Basophils Relative 0  0 - 1 %   Basophils Absolute 0.0  0.0 - 0.1 K/uL  COMPREHENSIVE METABOLIC PANEL     Status: Abnormal   Collection Time    08/18/12  7:45 PM      Result Value Range   Sodium 135  135 - 145 mEq/L   Potassium 3.8  3.5 - 5.1 mEq/L   Chloride 101  96 - 112 mEq/L   CO2 24  19 - 32 mEq/L   Glucose, Bld 243 (*) 70 - 99 mg/dL   BUN 15  6 - 23 mg/dL   Creatinine, Ser 1.61  0.50 - 1.35 mg/dL   Calcium 9.3  8.4 - 09.6 mg/dL   Total Protein 6.1  6.0 - 8.3 g/dL   Albumin 3.5  3.5 - 5.2 g/dL   AST 16  0 - 37 U/L   ALT 23  0 - 53 U/L   Alkaline Phosphatase 82  39 - 117 U/L   Total Bilirubin 0.4  0.3 - 1.2 mg/dL   GFR calc non Af Amer 86 (*) >90 mL/min   GFR calc Af Amer >90  >90 mL/min   Comment:            The eGFR has been calculated     using the CKD EPI equation.     This calculation has not been     validated in all clinical     situations.     eGFR's persistently     <90 mL/min signify     possible Chronic Kidney Disease.  LIPASE, BLOOD     Status: None   Collection Time    08/18/12  7:45 PM      Result Value Range   Lipase 16  11 - 59 U/L  TROPONIN I     Status: None   Collection Time    08/18/12  7:45 PM      Result Value Range   Troponin I <0.30  <0.30 ng/mL   Comment:            Due to the release kinetics of cTnI,     a negative result within the first hours     of the onset of symptoms does not rule out      myocardial infarction with certainty.     If myocardial infarction is still suspected,     repeat the test at appropriate intervals.  TROPONIN I     Status: None   Collection Time    08/18/12 11:22 PM      Result Value Range   Troponin I <0.30  <0.30 ng/mL   Comment:            Due to the release kinetics of cTnI,     a negative result within the first hours     of the onset of symptoms does not rule out     myocardial infarction with certainty.     If myocardial infarction is still suspected,     repeat the test at appropriate intervals.    Radiology Reports: Dg Chest 2 View  08/18/2012  *RADIOLOGY REPORT*  Clinical Data: Chest pain and shortness of breath.  CHEST - 2 VIEW  Comparison: 02/26/2012.  Findings: Cardiomegaly and central airway thickening are stable. There is no focal airspace disease, edema or significant pleural effusion. There are probable coronary artery stents.  Degenerative changes throughout the thoracic spine are stable.  Telemetry leads overlie the chest.  IMPRESSION: Stable  cardiomegaly and probable chronic bronchitis.  No acute cardiopulmonary process identified.   Original Report Authenticated By: Carey Bullocks, M.D.    Ct Angio Chest Pe W/cm &/or Wo Cm  08/18/2012  *RADIOLOGY REPORT*  Clinical Data: Back pain and left arm pain; intermittent right upper chest pain.  CT ANGIOGRAPHY CHEST  Technique:  Multidetector CT imaging of the chest using the standard protocol during bolus administration of intravenous contrast. Multiplanar reconstructed images including MIPs were obtained and reviewed to evaluate the vascular anatomy.  Contrast: OMNIPAQUE IOHEXOL 350 MG/ML SOLN  Comparison: Chest radiograph performed earlier today at 08:22 p.m., and CTA of the chest performed 01/09/2007  Findings: There is no evidence of pulmonary embolus.  A grossly stable lymph node is noted along the right major fissure. The lungs remain clear bilaterally.  There is no evidence of  significant focal consolidation, pleural effusion or pneumothorax. No masses are identified; no abnormal focal contrast enhancement is seen.  Diffuse coronary artery calcifications are seen.  The mediastinum is otherwise unremarkable in appearance.  No mediastinal lymphadenopathy is seen.  No pericardial effusion is identified. No axillary lymphadenopathy is seen.  The thyroid gland is unremarkable in appearance.  The visualized portions of the liver and spleen are unremarkable. A 2.9 cm cyst is noted arising at the upper pole of the right kidney.  Nonspecific perinephric stranding is noted bilaterally. The visualized portions of the gallbladder, pancreas and adrenal glands are within normal limits.  A splenule is again noted at the tail of the pancreas.  No acute osseous abnormalities are seen.  IMPRESSION:  1.  No evidence of pulmonary embolus. 2.  Lungs clear bilaterally. 3.  Diffuse coronary artery calcifications seen. 4.  Right renal cyst noted.   Original Report Authenticated By: Tonia Ghent, M.D.     Electrocardiogram: EKG shows a sinus rhythm at 64 beats per minute. Left axis deviation is noted. Left hypertrophy is seen. Nonspecific T wave changes. No acute ST changes are noted. EKG is similar to one from September of 2013.  Problem List  Principal Problem:   Chest pain Active Problems:   CAD, multiple prior RCA PCI's. Last cath 2010, Myoview low risk Oct 2012   IDDM (insulin dependent diabetes mellitus)   HTN (hypertension)   Sleep apnea, on C-pap   COPD (chronic obstructive pulmonary disease)   Tobacco abuse   Assessment: This is a 62 year old, Caucasian male, who presents with chest pain, back pain. He has a significant history of coronary artery disease, and, so, this could be angina. Could also be musculoskeletal. He also has a history of acid reflux disease. Because of his extensive history he will need to be observed overnight. ED physician did discuss with the cardiologist at  [facility]. He had a cardiac cath in September of 2013, which did not show in-stent restenosis. Did show minor irregularities and moderate decrease in LV function.  Plan: #1 chest pain: His initial troponin is negative. So, this is unlikely to be acute coronary syndrome. However, angina is still a possibility, but less likely. Because of his extensive history he will be observed. Troponins will be cycled couple of more times. Cardiology will be consulted. EKG will be repeated. Continue with his antiplatelet agents.  #2 history of significant coronary artery disease: Please see above. Continue with his beta blocker antiplatelet agents.  #3 history of insulin-dependent diabetes: Continue with his long acting insulin. Sliding scale insulin will be provided as well.  #4 history of sleep apnea: Continue with  CPAP.  #5 history of hypertension: Her blood pressure closely. Continue with his anti hypertensive agents.   DVT Prophylaxis: With Lovenox Code Status: Full code Family Communication: Discussed with the patient in detail. He was reluctant to stay in the hospital.  Disposition Plan: Likely return home in improved   Further management decisions will depend on results of further testing and patient's response to treatment.  [facility]  [facility] Pager [phone]  If 7PM-7AM, please contact night-coverage [email or web address] Password [email or web address]  08/19/2012, 12:08 AM

## 2012-08-19 NOTE — Progress Notes (Signed)
Pt discharged with instructions, prescriptions, and carenotes. He verbalized understanding.  Pt left the floor via ambulation in stable condition. He refused to wait on staff to transport him.

## 2012-08-19 NOTE — Progress Notes (Signed)
Inpatient Diabetes Program Recommendations  AACE/ADA: New Consensus Statement on Inpatient Glycemic Control (2013)  Target Ranges:  Prepandial:   less than 140 mg/dL      Peak postprandial:   less than 180 mg/dL (1-2 hours)      Critically ill patients:  140 - 180 mg/dL   Results for ASRIEL, WESTRUP (MRN [id number]) as of 08/19/2012 08:53  Ref. Range 02/26/2012 14:40  Hemoglobin A1C Latest Range: <5.7 % 7.3 (H)    Inpatient Diabetes Program Recommendations HgbA1C: May want to consider ordering an A1C to determine glycemic control over the past 2-3 months.  Last A1C in the chart is 7.3% on 02-26-12.  Note: Patient has a history of diabetes and takes Levemir 26 units QHS at home for diabetes management.  Currently, patient is ordered to receive Levemir 26 units QHS and Novolog moderate correction ACHS for inpatient glycemic control.  Initial lab blood glucose was 243 mg/dl on 4/0/98 @ 11:91.  Currently, blood glucose ranging from 125-81 mg/dl over the past 8 hours.  Last A1C in the chart was 7.3% on 02/26/12.  May want to consider ordering an A1C to determine glycemic control over the past 2-3 months.  Will continue to follow.  Thanks, Orlando Penner, RN, BSN, CCRN Diabetes Coordinator Inpatient Diabetes Program [phone]

## 2012-10-07 ENCOUNTER — Telehealth: Payer: Self-pay | Admitting: Family Medicine

## 2012-10-07 MED ORDER — GLUCOSE BLOOD VI STRP: ORAL_STRIP | Status: DC

## 2012-10-07 NOTE — Addendum Note (Signed)
Addended by: Legrand Rams B on: 10/07/2012 03:06 PM   Modules accepted: Orders

## 2012-10-07 NOTE — Telephone Encounter (Signed)
Rx Refilled  

## 2012-10-07 NOTE — Telephone Encounter (Signed)
One touch test strips.test twice a day #100 last rf 07/25/2012,last ov 08/28/12

## 2012-10-28 ENCOUNTER — Other Ambulatory Visit: Payer: Self-pay | Admitting: Family Medicine

## 2012-11-14 ENCOUNTER — Ambulatory Visit (INDEPENDENT_AMBULATORY_CARE_PROVIDER_SITE_OTHER): Payer: 59 | Admitting: Family Medicine

## 2012-11-14 ENCOUNTER — Encounter: Payer: Self-pay | Admitting: Family Medicine

## 2012-11-14 VITALS — BP 110/64 | HR 76 | Temp 98.0°F | Resp 16 | Wt 240.0 lb

## 2012-11-14 DIAGNOSIS — L570 Actinic keratosis: Secondary | ICD-10-CM

## 2012-11-14 DIAGNOSIS — I2510 Atherosclerotic heart disease of native coronary artery without angina pectoris: Secondary | ICD-10-CM

## 2012-11-14 DIAGNOSIS — I10 Essential (primary) hypertension: Secondary | ICD-10-CM | POA: Insufficient documentation

## 2012-11-14 DIAGNOSIS — E119 Type 2 diabetes mellitus without complications: Secondary | ICD-10-CM

## 2012-11-14 DIAGNOSIS — E118 Type 2 diabetes mellitus with unspecified complications: Secondary | ICD-10-CM | POA: Insufficient documentation

## 2012-11-14 DIAGNOSIS — E785 Hyperlipidemia, unspecified: Secondary | ICD-10-CM | POA: Insufficient documentation

## 2012-11-14 NOTE — Progress Notes (Signed)
Subjective:    Patient ID: John [name], male    DOB: Apr 14, 1951, 62 y.o.   MRN: [id number]  HPI  Patient has several lesions on both forearms and his concern about.  There are 3 heart rhythm papules with adherent white scale on his left arm. There are 2 right scaly hard papules on his right forearm.  They range in size from 3 mm to 6 mm. They have the appearance of actinic keratoses versus early squamous cell carcinomas. He is requesting cryotherapy.  He also has a history of insulin-dependent diabetes mellitus. He is currently taking Lantus 26 units subcutaneous daily. He reports fasting blood sugars of 90-110. He reports two-hour postprandial sugars of 170. He denies hypoglycemic episodes. He denies neuropathy. He is overdue for an eye exam and lab work.  He also has a history of coronary artery disease. He denies any chest pain shortness of breath or dyspnea on exertion.  He takes Crestor 20 mg by mouth daily. He denies any myalgias or right upper quadrant pain. Past Medical History  Diagnosis Date  . Coronary artery disease   . Myocardial infarction   . Shortness of breath   . Sleep apnea   . Hypertension   . Hyperlipidemia   . Diabetes mellitus    Current Outpatient Prescriptions on File Prior to Visit  Medication Sig Dispense Refill  . acetaminophen (TYLENOL) 325 MG tablet Take 2 tablets (650 mg total) by mouth every 4 (four) hours as needed.      [name] aspirin EC 81 MG tablet Take 81 mg by mouth daily.      . benazepril (LOTENSIN) 5 MG tablet Take 5 mg by mouth at bedtime.       . busPIRone (BUSPAR) 10 MG tablet Take 10 mg by mouth at bedtime.       . clopidogrel (PLAVIX) 75 MG tablet Take 75 mg by mouth daily.      . cyclobenzaprine (FLEXERIL) 5 MG tablet Take 1 tablet (5 mg total) by mouth 2 (two) times daily as needed for muscle spasms.  10 tablet  0  . dexlansoprazole (DEXILANT) 60 MG capsule Take 60 mg by mouth daily.      [name] ezetimibe (ZETIA) 10 MG tablet Take 10 mg by  mouth at bedtime.       [name] glucose blood test strip One Touch Test Strips Check BS bid  100 each  11  . insulin detemir (LEVEMIR) 100 UNIT/ML injection Inject 26 Units into the skin at bedtime.      [name] LEVEMIR FLEXPEN 100 UNIT/ML SOPN INJECT 26 UNITS AT BEDTIME  3 mL  6  . metoprolol tartrate (LOPRESSOR) 25 MG tablet Take 1 tablet (25 mg total) by mouth every morning.  30 tablet  11  . montelukast (SINGULAIR) 10 MG tablet Take 10 mg by mouth at bedtime.      . nitroGLYCERIN (NITROSTAT) 0.4 MG SL tablet Place 1 tablet (0.4 mg total) under the tongue every 5 (five) minutes x 3 doses as needed for chest pain.  25 tablet  2  . rosuvastatin (CRESTOR) 20 MG tablet Take 20 mg by mouth at bedtime.        No current facility-administered medications on file prior to visit.   Allergies  Allergen Reactions  . Fish Allergy Itching  . Fish Oil    History   Social History  . Marital Status: Married    Spouse Name: N/A    Number of Children: N/A  .  Years of Education: N/A   Occupational History  . Not on file.   Social History Main Topics  . Smoking status: Current Some Day Smoker -- 1.00 packs/day for 50 years  . Smokeless tobacco: Not on file  . Alcohol Use: Yes     Comment: occasionally  . Drug Use: No  . Sexually Active: Yes   Other Topics Concern  . Not on file   Social History Narrative  . No narrative on file   Family History  Problem Relation Age of Onset  . Heart attack Father      Review of Systems  All other systems reviewed and are negative.       Objective:   Physical Exam  Vitals reviewed. Constitutional: He appears well-developed and well-nourished.  Cardiovascular: Normal rate and regular rhythm.   Pulmonary/Chest: Effort normal and breath sounds normal.  Abdominal: Soft.   Diabetic foot exam is performed. He has 5 actinic keratoses versus early squamous cell carcinomas. There are 3 on the left forearm and 2 on the right forearm       Assessment &  Plan:  1. AK (actinic keratosis) Cryotherapy was applied to each total of 20 seconds. The 5 treatments in total.  2. Type II or unspecified type diabetes mellitus without mention of complication, not stated as uncontrolled Return fasting for screening lab work. His goal A1c is less then 7.  I encouraged him to call his ophthalmologist and schedule an annual eye exam. I also recommended he begin filing the pre-ulcer with callus with a pumice stone every night and applying Vaseline to the skin in order to soften it. - COMPLETE METABOLIC PANEL WITH GFR; Future - Hemoglobin A1c; Future - Lipid panel; Future - Microalbumin, urine; Future  3. CAD (coronary artery disease) His goal LDL is less than 70. Return for fasting lipid panel. - Lipid panel; Future

## 2012-11-15 ENCOUNTER — Other Ambulatory Visit (INDEPENDENT_AMBULATORY_CARE_PROVIDER_SITE_OTHER): Payer: 59

## 2012-11-15 DIAGNOSIS — Z0000 Encounter for general adult medical examination without abnormal findings: Secondary | ICD-10-CM

## 2012-11-15 DIAGNOSIS — E119 Type 2 diabetes mellitus without complications: Secondary | ICD-10-CM

## 2012-11-15 DIAGNOSIS — I2510 Atherosclerotic heart disease of native coronary artery without angina pectoris: Secondary | ICD-10-CM

## 2012-11-15 LAB — COMPLETE METABOLIC PANEL WITH GFR
Albumin: 4.1 g/dL (ref 3.5–5.2)
Alkaline Phosphatase: 73 U/L (ref 39–117)
BUN: 14 mg/dL (ref 6–23)
Creat: 0.98 mg/dL (ref 0.50–1.35)
GFR, Est Non African American: 81 mL/min
Glucose, Bld: 114 mg/dL — ABNORMAL HIGH (ref 70–99)
Potassium: 5.2 mEq/L (ref 3.5–5.3)

## 2012-11-15 LAB — HEMOGLOBIN A1C: Hgb A1c MFr Bld: 7.4 % — ABNORMAL HIGH (ref <5.7)

## 2012-11-15 LAB — LIPID PANEL
LDL Cholesterol: 40 mg/dL (ref 0–99)
Triglycerides: 81 mg/dL (ref <150)

## 2012-11-15 LAB — MICROALBUMIN, URINE: Microalb, Ur: 0.64 mg/dL (ref 0.00–1.89)

## 2012-11-18 ENCOUNTER — Other Ambulatory Visit (HOSPITAL_COMMUNITY): Payer: Self-pay | Admitting: Cardiovascular Disease

## 2012-11-18 DIAGNOSIS — R0989 Other specified symptoms and signs involving the circulatory and respiratory systems: Secondary | ICD-10-CM

## 2012-11-19 ENCOUNTER — Encounter: Payer: Self-pay | Admitting: Cardiovascular Disease

## 2012-11-21 ENCOUNTER — Telehealth: Payer: Self-pay | Admitting: Cardiovascular Disease

## 2012-11-21 NOTE — Telephone Encounter (Signed)
Message forwarded to [facility]. Berlinda Last, LPN to discuss w/ Dr. Alanda Amass.  This note and paper chart# [id number] placed on Dr. Kandis Cocking cart.

## 2012-11-21 NOTE — Telephone Encounter (Signed)
Pt said he was seen by Dr Leisa Lenz Monday and told him he was going to get info about C-Pac machine-still have not heard anything!

## 2012-11-21 NOTE — Telephone Encounter (Signed)
[name]  getting new a new c-pap machine and suppiles

## 2012-11-25 ENCOUNTER — Telehealth: Payer: Self-pay | Admitting: Cardiovascular Disease

## 2012-11-25 NOTE — Telephone Encounter (Signed)
Pt needs a call back about his fu visit last week. MR # [id number]. A few things were not discussed and he needs to fu with that.

## 2012-11-25 NOTE — Telephone Encounter (Signed)
Returned call.  Pt stated he saw Dr. Alanda Amass last Monday and he was supposed to be set up to get the veins in his neck looked at and he hasn't been called about that.  Pt also with concerns about getting a new CPAP machine and supplies.  Pt stated he received a call from [facility] and was told they needed more information from [facility] and they would get that information so they could set up an appt with him.  Pt stated he hasn't heard anything else since Thursday.  Pt informed JC will be notified r/t CPAP and pt informed carotid duplex was ordered, but has not been scheduled as of yet.  Pt informed the schedulers will contact him to set up.  Pt verbalized understanding and agreed w/ plan.  Message forwarded to [facility] Berlinda Last, LPN.

## 2012-11-25 NOTE — Telephone Encounter (Signed)
Talked to pt. And discussed his cpap machine and I will get Dr. Alanda Amass to dictate a note tomarrow

## 2012-12-06 ENCOUNTER — Ambulatory Visit (HOSPITAL_COMMUNITY)
Admission: RE | Admit: 2012-12-06 | Discharge: 2012-12-06 | Disposition: A | Discharge status: Home / Self Care | Payer: 59 | Source: Ambulatory Visit | Attending: Internal Medicine | Admitting: Internal Medicine

## 2012-12-06 DIAGNOSIS — R0989 Other specified symptoms and signs involving the circulatory and respiratory systems: Secondary | ICD-10-CM | POA: Insufficient documentation

## 2012-12-06 NOTE — Progress Notes (Signed)
Carotid Duplex Completed. °John [name] ° °

## 2012-12-29 ENCOUNTER — Encounter (HOSPITAL_COMMUNITY): Payer: Self-pay | Admitting: Emergency Medicine

## 2012-12-29 ENCOUNTER — Emergency Department (HOSPITAL_COMMUNITY): Payer: 59

## 2012-12-29 ENCOUNTER — Emergency Department (HOSPITAL_COMMUNITY)
Admission: EM | Admit: 2012-12-29 | Discharge: 2012-12-29 | Disposition: A | Discharge status: Home / Self Care | Payer: 59 | Attending: Emergency Medicine | Admitting: Emergency Medicine

## 2012-12-29 DIAGNOSIS — E785 Hyperlipidemia, unspecified: Secondary | ICD-10-CM | POA: Insufficient documentation

## 2012-12-29 DIAGNOSIS — Y9239 Other specified sports and athletic area as the place of occurrence of the external cause: Secondary | ICD-10-CM | POA: Insufficient documentation

## 2012-12-29 DIAGNOSIS — Z9861 Coronary angioplasty status: Secondary | ICD-10-CM | POA: Insufficient documentation

## 2012-12-29 DIAGNOSIS — E119 Type 2 diabetes mellitus without complications: Secondary | ICD-10-CM | POA: Insufficient documentation

## 2012-12-29 DIAGNOSIS — I2510 Atherosclerotic heart disease of native coronary artery without angina pectoris: Secondary | ICD-10-CM | POA: Insufficient documentation

## 2012-12-29 DIAGNOSIS — Y9389 Activity, other specified: Secondary | ICD-10-CM | POA: Insufficient documentation

## 2012-12-29 DIAGNOSIS — Z794 Long term (current) use of insulin: Secondary | ICD-10-CM | POA: Insufficient documentation

## 2012-12-29 DIAGNOSIS — I252 Old myocardial infarction: Secondary | ICD-10-CM | POA: Insufficient documentation

## 2012-12-29 DIAGNOSIS — Y92838 Other recreation area as the place of occurrence of the external cause: Secondary | ICD-10-CM | POA: Insufficient documentation

## 2012-12-29 DIAGNOSIS — Z7902 Long term (current) use of antithrombotics/antiplatelets: Secondary | ICD-10-CM | POA: Insufficient documentation

## 2012-12-29 DIAGNOSIS — Z7982 Long term (current) use of aspirin: Secondary | ICD-10-CM | POA: Insufficient documentation

## 2012-12-29 DIAGNOSIS — IMO0002 Reserved for concepts with insufficient information to code with codable children: Secondary | ICD-10-CM | POA: Insufficient documentation

## 2012-12-29 DIAGNOSIS — S91109A Unspecified open wound of unspecified toe(s) without damage to nail, initial encounter: Secondary | ICD-10-CM | POA: Insufficient documentation

## 2012-12-29 DIAGNOSIS — F17200 Nicotine dependence, unspecified, uncomplicated: Secondary | ICD-10-CM | POA: Insufficient documentation

## 2012-12-29 DIAGNOSIS — G4730 Sleep apnea, unspecified: Secondary | ICD-10-CM | POA: Insufficient documentation

## 2012-12-29 DIAGNOSIS — Z79899 Other long term (current) drug therapy: Secondary | ICD-10-CM | POA: Insufficient documentation

## 2012-12-29 DIAGNOSIS — I10 Essential (primary) hypertension: Secondary | ICD-10-CM | POA: Insufficient documentation

## 2012-12-29 DIAGNOSIS — S91209A Unspecified open wound of unspecified toe(s) with damage to nail, initial encounter: Secondary | ICD-10-CM

## 2012-12-29 DIAGNOSIS — Z23 Encounter for immunization: Secondary | ICD-10-CM | POA: Insufficient documentation

## 2012-12-29 LAB — GLUCOSE, CAPILLARY: Glucose-Capillary: 183 mg/dL — ABNORMAL HIGH (ref 70–99)

## 2012-12-29 MED ORDER — LIDOCAINE HCL (PF) 2 % IJ SOLN
10.0000 mL | Freq: Once | INTRAMUSCULAR | Status: AC
  Administered 2012-12-29: 10 mL via INTRADERMAL
  Filled 2012-12-29: qty 10

## 2012-12-29 MED ORDER — TETANUS-DIPHTH-ACELL PERTUSSIS 5-2.5-18.5 LF-MCG/0.5 IM SUSP
0.5000 mL | Freq: Once | INTRAMUSCULAR | Status: AC
  Administered 2012-12-29: 0.5 mL via INTRAMUSCULAR
  Filled 2012-12-29: qty 0.5

## 2012-12-29 MED ORDER — HYDROCODONE-ACETAMINOPHEN 5-325 MG PO TABS: ORAL_TABLET | ORAL | Status: DC

## 2012-12-29 MED ORDER — SULFAMETHOXAZOLE-TRIMETHOPRIM 800-160 MG PO TABS: 1.0000 | ORAL_TABLET | Freq: Two times a day (BID) | ORAL | Status: DC

## 2012-12-29 MED ORDER — OXYCODONE-ACETAMINOPHEN 5-325 MG PO TABS
1.0000 | ORAL_TABLET | Freq: Once | ORAL | Status: DC
  Filled 2012-12-29: qty 1

## 2012-12-29 NOTE — ED Notes (Signed)
Petroleum gauze with tube gauze dressing applied to left great toe.

## 2012-12-29 NOTE — ED Notes (Addendum)
Patient has injury to left great toe. Per patient treated on steps on back deck and hit toe. Great toenail almost completely detached. Patient reports DM and tqaking coumadin. Small amount of active bleeding noted. Patient reports trying to remove toenail by pulling it off the rest of the way with no success.

## 2012-12-31 ENCOUNTER — Telehealth: Payer: Self-pay | Admitting: Cardiovascular Disease

## 2012-12-31 NOTE — Telephone Encounter (Signed)
Calling about the results of his test.. Please call  Thanks

## 2012-12-31 NOTE — Telephone Encounter (Signed)
Message forwarded to J.C. Wildman, LPN.  

## 2012-12-31 NOTE — ED Provider Notes (Signed)
History    CSN: [id number] Arrival date & time 12/29/12  1352  First MD Initiated Contact with Patient 12/29/12 1459     Chief Complaint  Patient presents with  . Toe Pain   (Consider location/radiation/quality/duration/timing/severity/associated sxs/prior Treatment) HPI Comments: Patient c/o pain and partially avulsed nail of the left great toe.  States that he "stumped" his toe getting out of a swimming pool.  He denies pain proximal to the toe, head injury, back pain or hip pain.  atient reports hx of DM and takes plavix and ASA daily  Patient is a 62 y.o. male presenting with toe pain. The history is provided by the patient.  Toe Pain This is a new problem. Episode onset: just PTA. The problem occurs constantly. The problem has been unchanged. Associated symptoms include arthralgias and numbness. Pertinent negatives include no chills, fever, joint swelling, vomiting or weakness. The symptoms are aggravated by bending and standing. He has tried nothing for the symptoms. The treatment provided no relief.   Past Medical History  Diagnosis Date  . Coronary artery disease   . Myocardial infarction   . Shortness of breath   . Sleep apnea   . Hypertension   . Hyperlipidemia   . Diabetes mellitus    Past Surgical History  Procedure Laterality Date  . Back surgery  1985  . Cardiac catheterization  2010    6 stents total   Family History  Problem Relation Age of Onset  . Heart attack Father    History  Substance Use Topics  . Smoking status: Current Some Day Smoker -- 1.00 packs/day for 50 years    Types: Cigarettes  . Smokeless tobacco: Never Used  . Alcohol Use: Yes     Comment: occasionally    Review of Systems  Constitutional: Negative for fever and chills.  Gastrointestinal: Negative for vomiting.  Genitourinary: Negative for dysuria and difficulty urinating.  Musculoskeletal: Positive for arthralgias. Negative for joint swelling.  Skin: Positive for wound.  Negative for color change.  Neurological: Positive for numbness. Negative for weakness.  All other systems reviewed and are negative.    Allergies  Fish allergy and Fish oil  Home Medications   Current Outpatient Rx  Name  Route  Sig  Dispense  Refill  . aspirin EC 81 MG tablet   Oral   Take 81 mg by mouth daily.         . benazepril (LOTENSIN) 5 MG tablet   Oral   Take 5 mg by mouth at bedtime.          . busPIRone (BUSPAR) 10 MG tablet   Oral   Take 10 mg by mouth daily.          . clopidogrel (PLAVIX) 75 MG tablet   Oral   Take 75 mg by mouth daily.         [name] dexlansoprazole (DEXILANT) 60 MG capsule   Oral   Take 60 mg by mouth daily.         [name] ezetimibe (ZETIA) 10 MG tablet   Oral   Take 10 mg by mouth at bedtime.          . insulin detemir (LEVEMIR) 100 UNIT/ML injection   Subcutaneous   Inject 26 Units into the skin at bedtime.         . metoprolol succinate (TOPROL-XL) 50 MG 24 hr tablet   Oral   Take 50 mg by mouth daily. Take with or immediately following  a meal.         . montelukast (SINGULAIR) 10 MG tablet   Oral   Take 10 mg by mouth daily.          . rosuvastatin (CRESTOR) 20 MG tablet   Oral   Take 20 mg by mouth at bedtime.          [name] HYDROcodone-acetaminophen (NORCO/VICODIN) 5-325 MG per tablet      Take one-two tabs po q 4-6 hrs prn pain   20 tablet   0   . nitroGLYCERIN (NITROSTAT) 0.4 MG SL tablet   Sublingual   Place 1 tablet (0.4 mg total) under the tongue every 5 (five) minutes x 3 doses as needed for chest pain.   25 tablet   2   . sulfamethoxazole-trimethoprim (SEPTRA DS) 800-160 MG per tablet   Oral   Take 1 tablet by mouth 2 (two) times daily.   20 tablet   0    BP 118/67  Pulse 71  Temp(Src) 98.9 F (37.2 C) (Oral)  Ht 6' (1.829 m)  Wt 240 lb (108.863 kg)  BMI 32.54 kg/m2  SpO2 97% Physical Exam  Nursing note and vitals reviewed. Constitutional: He is oriented to person, place, and time.  He appears well-developed and well-nourished. No distress.  HENT:  Head: Normocephalic and atraumatic.  Cardiovascular: Normal rate, regular rhythm, normal heart sounds and intact distal pulses.   Pulmonary/Chest: Effort normal and breath sounds normal.  Musculoskeletal: He exhibits tenderness. He exhibits no edema.  Partially avulsed nail of the left great toe.  Slight bleeding present.   DP pulse is brisk, distal sensation intact. No bony deformity.    Neurological: He is alert and oriented to person, place, and time. He exhibits normal muscle tone. Coordination normal.  Skin: Skin is warm and dry.    ED Course  nail injry Date/Time: 12/29/2012 3:30 PM Performed by: Trisha Mangle, Celie Desrochers L. Authorized by: Maxwell Caul Consent: Verbal consent obtained. written consent not obtained. Risks and benefits: risks, benefits and alternatives were discussed Consent given by: patient Patient understanding: patient states understanding of the procedure being performed Patient consent: the patient's understanding of the procedure matches consent given Imaging studies: imaging studies available Patient identity confirmed: verbally with patient and arm band Time out: Immediately prior to procedure a "time out" was called to verify the correct patient, procedure, equipment, support staff and site/side marked as required. Nail removal extremity: left great toe nail. Anesthesia: digital block Local anesthetic: lidocaine 2% without epinephrine Anesthetic total: 4 ml Patient sedated: no Preparation: skin prepped with Betadine and sterile field established Nail bed sutured: no Nail matrix removed: none Dressing: tube gauze Patient tolerance: Patient tolerated the procedure well with no immediate complications. Comments: Proximal end of avulsed nail was replaced, bleeding controlled.  Pain improved.  Remains NV intact   (including critical care time) Labs Reviewed  GLUCOSE, CAPILLARY - Abnormal;  Notable for the following:    Glucose-Capillary 183 (*)    All other components within normal limits    1. Toenail avulsion, initial encounter     MDM     Toe was bandaged, post op shoe applied,  Pt is diabetic, given instructions to f/u with podiatry or PMD for recheck, will prescribe septra and vicodin.  He agrees to return here if sx's of infection  Sereen Schaff L. Bailee Metter, PA-C 12/31/12 2231

## 2013-01-01 NOTE — Telephone Encounter (Signed)
Results for carotid doppler given to wife and she stated she would pass it on to Riaan

## 2013-01-01 NOTE — ED Provider Notes (Signed)
Medical screening examination/treatment/procedure(s) were performed by non-physician practitioner and as supervising physician I was immediately available for consultation/collaboration.   Joya Gaskins, MD 01/01/13 [phone]

## 2013-01-14 ENCOUNTER — Ambulatory Visit (INDEPENDENT_AMBULATORY_CARE_PROVIDER_SITE_OTHER): Payer: 59 | Admitting: Family Medicine

## 2013-01-14 ENCOUNTER — Encounter: Payer: Self-pay | Admitting: Family Medicine

## 2013-01-14 VITALS — BP 120/68 | HR 68 | Temp 98.0°F | Resp 18 | Wt 235.0 lb

## 2013-01-14 DIAGNOSIS — S91209S Unspecified open wound of unspecified toe(s) with damage to nail, sequela: Secondary | ICD-10-CM

## 2013-01-14 DIAGNOSIS — IMO0002 Reserved for concepts with insufficient information to code with codable children: Secondary | ICD-10-CM

## 2013-01-14 MED ORDER — SULFAMETHOXAZOLE-TMP DS 800-160 MG PO TABS: 1.0000 | ORAL_TABLET | Freq: Two times a day (BID) | ORAL | Status: DC

## 2013-01-14 NOTE — Progress Notes (Signed)
Subjective:    Patient ID: John [name], male    DOB: July 14, 1950, 62 y.o.   MRN: [id number]  HPI Patient was seen in the emergency room July 13 with a near complete avulsion of his right great toenail after tripping.  In the emergency room, the patient was given a digital block and the toenail was "reinserted" to protect the nailbed. He was placed on Bactrim.  He now complains of some tenderness and redness distal to the IP joint on the right great toe.  He has a history of insulin-dependent diabetes and he is concerned that there may be an infection.  He is here today for evaluation.  Past Medical History  Diagnosis Date  . Coronary artery disease   . Myocardial infarction   . Shortness of breath   . Sleep apnea   . Hypertension   . Hyperlipidemia   . Diabetes mellitus    Current Outpatient Prescriptions on File Prior to Visit  Medication Sig Dispense Refill  . aspirin EC 81 MG tablet Take 81 mg by mouth daily.      . benazepril (LOTENSIN) 5 MG tablet Take 5 mg by mouth at bedtime.       . busPIRone (BUSPAR) 10 MG tablet Take 10 mg by mouth daily.       . clopidogrel (PLAVIX) 75 MG tablet Take 75 mg by mouth daily.      [name] dexlansoprazole (DEXILANT) 60 MG capsule Take 60 mg by mouth daily.      [name] ezetimibe (ZETIA) 10 MG tablet Take 10 mg by mouth at bedtime.       [name] HYDROcodone-acetaminophen (NORCO/VICODIN) 5-325 MG per tablet Take one-two tabs po q 4-6 hrs prn pain  20 tablet  0  . insulin detemir (LEVEMIR) 100 UNIT/ML injection Inject 26 Units into the skin at bedtime.      . metoprolol succinate (TOPROL-XL) 50 MG 24 hr tablet Take 50 mg by mouth daily. Take with or immediately following a meal.      . montelukast (SINGULAIR) 10 MG tablet Take 10 mg by mouth daily.       . nitroGLYCERIN (NITROSTAT) 0.4 MG SL tablet Place 1 tablet (0.4 mg total) under the tongue every 5 (five) minutes x 3 doses as needed for chest pain.  25 tablet  2  . rosuvastatin (CRESTOR) 20 MG tablet Take 20  mg by mouth at bedtime.        No current facility-administered medications on file prior to visit.   Allergies  Allergen Reactions  . Fish Allergy Itching  . Fish Oil Itching and Rash   History   Social History  . Marital Status: Married    Spouse Name: N/A    Number of Children: N/A  . Years of Education: N/A   Occupational History  . Not on file.   Social History Main Topics  . Smoking status: Current Some Day Smoker -- 1.00 packs/day for 50 years    Types: Cigarettes  . Smokeless tobacco: Never Used  . Alcohol Use: Yes     Comment: occasionally  . Drug Use: No  . Sexually Active: Yes   Other Topics Concern  . Not on file   Social History Narrative  . No narrative on file      Review of Systems  All other systems reviewed and are negative.       Objective:   Physical Exam  Vitals reviewed. Cardiovascular: Normal rate, regular rhythm, normal heart sounds and  intact distal pulses.   No murmur heard. Pulmonary/Chest: Effort normal and breath sounds normal. No respiratory distress.   the patient has one over four dorsalis pedis and posterior tibialis pulses in the right foot the skin just distal to the IP joint on the right great toe is pink. Is neither warm nor tender to the touch. He has a very PIC dystrophic appearing toenail consistent with onychomycosis. There is no evidence of cellulitis with paronychia.        Assessment & Plan:  1. Avulsion of toenail, sequela There is no evidence of cellulitis, ingrown toenail, or paronychia. I did send a prescription for Bactrim double strength tablets 1 by mouth twice a day for 7 days to his pharmacy. I instructed the patient to get these antibiotics if the toe becomes acutely red, tender to the touch, or swollen. However, I feel that most likely he is seeing inflammation due to the injury as an explanation for the pink discoloration to the skin. It could also be a reaction to the reinserted toenail. It pink color  persists or worsens, I would recommend removing the toenail. Discussed signs and symptoms of cellulitis and when to fill the antibiotic.  Return immediately if the toe worsens.

## 2013-03-13 ENCOUNTER — Encounter: Payer: Self-pay | Admitting: *Deleted

## 2013-03-17 ENCOUNTER — Encounter: Payer: Self-pay | Admitting: Cardiovascular Disease

## 2013-03-17 ENCOUNTER — Ambulatory Visit (INDEPENDENT_AMBULATORY_CARE_PROVIDER_SITE_OTHER): Payer: 59 | Admitting: Cardiovascular Disease

## 2013-03-17 VITALS — BP 140/80 | HR 68 | Ht 72.0 in | Wt 238.8 lb

## 2013-03-17 DIAGNOSIS — I10 Essential (primary) hypertension: Secondary | ICD-10-CM

## 2013-03-17 DIAGNOSIS — G4730 Sleep apnea, unspecified: Secondary | ICD-10-CM

## 2013-03-17 DIAGNOSIS — I2510 Atherosclerotic heart disease of native coronary artery without angina pectoris: Secondary | ICD-10-CM

## 2013-03-17 NOTE — Patient Instructions (Addendum)
Your physician recommends that you schedule a follow-up appointment with Dr. Tresa Endo in 6 months for cardiology care.

## 2013-03-17 NOTE — Progress Notes (Signed)
Patient ID: John [name], male   DOB: 19-Aug-1950, 62 y.o.   MRN: [id number]   HPI: John [name], is a 62 y.o. male who has a history of obstructive sleep apnea since 2008 and has been followed by Dr. Alanda Amass. The patient presents for sleep clinic evaluation following obtaining a new CPAP machine several months ago.    John [name] is a 61 year old to has known coronary artery disease and is under gone multiple inventions with stenting to his circumflex and multiple stent placement in his right coronary artery. He was diagnosed with obstructive sleep apnea 2008 and has been utilizing CPAP therapy since that time. Recently, he was having issues with his old machine. He recently had a new CPAP machine which is in asinine oblique para-he's been at a set pressure of 12. He uses a fissure calcium plus large fullface mask. A download obtained from 12/18/2002 through 03/08/2013 shows excellent compliance Z1 100% of the days. He is averaging 7 hours and 47 minutes per night. H. I is 3.1. Of note,  during the first month of use he had minimal leak. Over the last several exam does suggest that there is been significantly more leak. He does have facial hair. He denies nocturnal palpitations. He is unaware of breakthrough snoring. He typically goes to bed at approximately 10:30 to 11 at night and wakes up at 6:45 AM.  Epworth Sleepiness Scale: Situation   Chance of Dozing/Sleeping (0 = never , 1 = slight chance , 2 = moderate chance , 3 = high chance )   sitting and reading 3   watching TV 2   sitting inactive in a public place 3   being a passenger in a motor vehicle for an hour or more 0   lying down in the afternoon 0   sitting and talking to someone 0   sitting quietly after lunch (no alcohol) 2   while stopped for a few minutes in traffic as the driver 1   Total Score  11    Past Medical History  Diagnosis Date  . Coronary artery disease   . Myocardial infarction     posterior MI  .  Shortness of breath   . Sleep apnea     on CPAP; 04/28/2007 split-night - AHI during total sleep 44.43/hr and REM 72.56/hr  . Hypertension   . Hyperlipidemia   . Diabetes mellitus   . GERD (gastroesophageal reflux disease)   . Left foot drop     r/t past disk srugery - uses Kevlar brace  . History of nuclear stress test 04/04/2011    lexiscan; mod-large in size fixed inferolateral defect (scar); non-diagnostic for ischemia; low risk scan     Past Surgical History  Procedure Laterality Date  . Back surgery  1985  . Transthoracic echocardiogram  07/29/2010    EF 50=55%, mod inf wall hypokinesis & mild post wall hypokinesis; LA mild-mod dilated; mild mitral annular calcif & mild MR; mild TR & elevated RV systolic pressure; AV mildly sclerotic; mild aortic root dilatation   . Cardiac catheterization  2010    6 stents total  . Coronary angioplasty with stent placement  03/1994    angioplasty & stenting (non-DES) of circumflex/prox ramus intermedius  . Coronary angioplasty with stent placement  10/1994    large iliac [id number] stent to RCA  . Coronary angioplasty  09/1998    mid-distal RCA balloon dilatation, 4.5 & 5.0 stents   . Cardiac catheterization  01/2000  percutaneous transluminal coronary balloon angioplasty of mid RCA stenotic lesion  . Coronary angioplasty with stent placement  12/2002    4.47mm stents x2 of RCA  . Coronary angioplasty with stent placement  01/2005    cutting balloon arthrectomy of distal RCA & Cypher DES 3.5x13; cutting balloon arthrectomy of mid RCA with Cypher DES 3.5x18  . Cardiac catheterization  06/2006    no stenting; ischemic cardiomyopathy, EF 40-45%  . Coronary angioplasty with stent placement  11/2008    stenting of mid RCA with 4.0x44mm driver, non-DES    Allergies  Allergen Reactions  . Fish Allergy Itching  . Fish Oil Itching and Rash    Current Outpatient Prescriptions  Medication Sig Dispense Refill  . aspirin EC 81 MG tablet Take 81 mg by  mouth daily.      . benazepril (LOTENSIN) 5 MG tablet Take 5 mg by mouth at bedtime.       . busPIRone (BUSPAR) 10 MG tablet Take 10 mg by mouth daily.       . clopidogrel (PLAVIX) 75 MG tablet Take 75 mg by mouth daily.      [name] dexlansoprazole (DEXILANT) 60 MG capsule Take 60 mg by mouth daily.      [name] ezetimibe (ZETIA) 10 MG tablet Take 10 mg by mouth at bedtime.       . insulin detemir (LEVEMIR) 100 UNIT/ML injection Inject 26 Units into the skin at bedtime.      . metoprolol succinate (TOPROL-XL) 50 MG 24 hr tablet Take 50 mg by mouth daily. Take with or immediately following a meal.      . montelukast (SINGULAIR) 10 MG tablet Take 10 mg by mouth daily.       . rosuvastatin (CRESTOR) 20 MG tablet Take 20 mg by mouth at bedtime.       [name] UNABLE TO FIND CPAP therapy      . nitroGLYCERIN (NITROSTAT) 0.4 MG SL tablet Place 1 tablet (0.4 mg total) under the tongue every 5 (five) minutes x 3 doses as needed for chest pain.  25 tablet  2   No current facility-administered medications for this visit.    Social history is notable he worked full time Scientist, forensic. There is a tobacco history. He rarely drinks alcohol.  ROS negative for fever, chills or night sweats. He denies breakthrough snoring when using CPAP but admits to snoring when taking naps without CPAP. He denies bruxism. He denies recent chest pain. He denies tachycardia palpitations. There is no wheezing or he denies claudication symptoms noticed a dry mouth. He denies edema  Other system review is negative.  PE BP 140/80  Pulse 68  Ht 6' (1.829 m)  Wt 238 lb 12.8 oz (108.319 kg)  BMI 32.38 kg/m2  General: Alert, oriented, no distress.  Skin: normal turgor, no rashes HEENT: Normocephalic, atraumatic. Pupils round and reactive; sclera anicteric; Fundi mild arterial narrowing Nose without nasal septal hypertrophy Mouth/Parynx benign; Mallinpatti scale 3 Neck: No JVD, no carotid briuts Lungs: clear to ausculatation and  percussion; no wheezing or rales Heart: RRR, s1 s2 normal 1/6 sem Abdomen: soft, nontender; no hepatosplenomehaly, BS+; abdominal aorta nontender and not dilated by palpation. Pulses 2+ Extremities: no clubbinbg cyanosis or edema, Homan's sign negative  Neurologic: grossly nonfocal   LABS:  BMET    Component Value Date/Time   NA 144 11/15/2012 0756   K 5.2 11/15/2012 0756   CL 108 11/15/2012 0756   CO2 30 11/15/2012 0756  GLUCOSE 114* 11/15/2012 0756   BUN 14 11/15/2012 0756   CREATININE 0.98 11/15/2012 0756   CREATININE 0.94 08/19/2012 0508   CALCIUM 9.1 11/15/2012 0756   GFRNONAA 87* 08/19/2012 0508   GFRAA >90 08/19/2012 0508     Hepatic Function Panel     Component Value Date/Time   PROT 5.9* 11/15/2012 0756   ALBUMIN 4.1 11/15/2012 0756   AST 21 11/15/2012 0756   ALT 24 11/15/2012 0756   ALKPHOS 73 11/15/2012 0756   BILITOT 0.7 11/15/2012 0756   BILIDIR 0.1 01/09/2007 1511   IBILI 0.5 01/09/2007 1511     CBC    Component Value Date/Time   WBC 8.4 08/19/2012 0508   RBC 5.33 08/19/2012 0508   HGB 16.3 08/19/2012 0508   HCT 47.4 08/19/2012 0508   PLT 161 08/19/2012 0508   MCV 88.9 08/19/2012 0508   MCH 30.6 08/19/2012 0508   MCHC 34.4 08/19/2012 0508   RDW 13.0 08/19/2012 0508   LYMPHSABS 2.1 08/18/2012 1945   MONOABS 0.4 08/18/2012 1945   EOSABS 0.2 08/18/2012 1945   BASOSABS 0.0 08/18/2012 1945     BNP No results found for this basename: probnp    Lipid Panel     Component Value Date/Time   CHOL 84 11/15/2012 0756   TRIG 81 11/15/2012 0756   HDL 28* 11/15/2012 0756   CHOLHDL 3.0 11/15/2012 0756   VLDL 16 11/15/2012 0756   LDLCALC 40 11/15/2012 0756     RADIOLOGY: No results found.    ASSESSMENT AND PLAN: John [name] presents for sleep clinic today after getting a new machine for 2 months ago history of significant obstructive sleep apnea and on his initial CPAP titration trial which was done in November 2008 sleep apnea with severe AHI of 44.4 per hour and during REM sleep was  increased at 72.6 per hour. At that time he had significant oxygen desaturation and had loud snoring. His new machine seems to be working well. However, he does seem to have a leak which has occurred in the last several months. I suspect this may be due to his fullface mask cushion which needs replacement.  We also instructed him on the adjustment in the humidity levels at that time he has noted some. [place] [facility] spent time with him as well. His most recent download shows AHI of 3.1 despite this recent leak. The patient has requested that I assume his Cardiologic care since that workup will be retiring. I will see me in 6 months for followup evaluation     John Bihari, MD, [facility]  03/17/2013 11:46 AM

## 2013-03-19 ENCOUNTER — Other Ambulatory Visit: Payer: Self-pay | Admitting: Internal Medicine

## 2013-03-25 ENCOUNTER — Other Ambulatory Visit: Payer: Self-pay | Admitting: *Deleted

## 2013-03-25 MED ORDER — EZETIMIBE 10 MG PO TABS: 10.0000 mg | ORAL_TABLET | Freq: Every day | ORAL | Status: DC

## 2013-03-25 MED ORDER — ROSUVASTATIN CALCIUM 20 MG PO TABS: 20.0000 mg | ORAL_TABLET | Freq: Every day | ORAL | Status: DC

## 2013-05-09 ENCOUNTER — Telehealth: Payer: Self-pay | Admitting: Family Medicine

## 2013-05-09 NOTE — Telephone Encounter (Signed)
Patient needs a refill on his glucose meter kit . His broke

## 2013-05-12 NOTE — Telephone Encounter (Signed)
Pt has gotten new meter from pharmacy

## 2013-06-02 ENCOUNTER — Other Ambulatory Visit: Payer: Self-pay

## 2013-06-02 MED ORDER — METOPROLOL SUCCINATE ER 50 MG PO TB24: 50.0000 mg | ORAL_TABLET | Freq: Every day | ORAL | Status: DC

## 2013-06-02 MED ORDER — BENAZEPRIL HCL 5 MG PO TABS: 5.0000 mg | ORAL_TABLET | Freq: Every day | ORAL | Status: DC

## 2013-06-02 NOTE — Telephone Encounter (Signed)
Rx was sent to pharmacy electronically. 

## 2013-06-30 ENCOUNTER — Ambulatory Visit (INDEPENDENT_AMBULATORY_CARE_PROVIDER_SITE_OTHER): Payer: 59 | Admitting: Cardiovascular Disease

## 2013-06-30 ENCOUNTER — Encounter: Payer: Self-pay | Admitting: Cardiovascular Disease

## 2013-06-30 VITALS — BP 138/72 | HR 75 | Ht 72.0 in | Wt 231.3 lb

## 2013-06-30 DIAGNOSIS — I10 Essential (primary) hypertension: Secondary | ICD-10-CM

## 2013-06-30 DIAGNOSIS — I2510 Atherosclerotic heart disease of native coronary artery without angina pectoris: Secondary | ICD-10-CM

## 2013-06-30 DIAGNOSIS — G4719 Other hypersomnia: Secondary | ICD-10-CM

## 2013-06-30 DIAGNOSIS — G4710 Hypersomnia, unspecified: Secondary | ICD-10-CM

## 2013-06-30 DIAGNOSIS — I119 Hypertensive heart disease without heart failure: Secondary | ICD-10-CM

## 2013-06-30 DIAGNOSIS — J4489 Other specified chronic obstructive pulmonary disease: Secondary | ICD-10-CM

## 2013-06-30 DIAGNOSIS — J449 Chronic obstructive pulmonary disease, unspecified: Secondary | ICD-10-CM

## 2013-06-30 DIAGNOSIS — G4733 Obstructive sleep apnea (adult) (pediatric): Secondary | ICD-10-CM

## 2013-06-30 DIAGNOSIS — E119 Type 2 diabetes mellitus without complications: Secondary | ICD-10-CM

## 2013-06-30 DIAGNOSIS — Z9989 Dependence on other enabling machines and devices: Secondary | ICD-10-CM

## 2013-06-30 LAB — CBC
HCT: 50 % (ref 39.0–52.0)
Hemoglobin: 17.4 g/dL — ABNORMAL HIGH (ref 13.0–17.0)
MCH: 30.8 pg (ref 26.0–34.0)
MCHC: 34.8 g/dL (ref 30.0–36.0)
MCV: 88.5 fL (ref 78.0–100.0)
PLATELETS: 186 10*3/uL (ref 150–400)
RBC: 5.65 MIL/uL (ref 4.22–5.81)
RDW: 13.3 % (ref 11.5–15.5)
WBC: 12 10*3/uL — AB (ref 4.0–10.5)

## 2013-06-30 LAB — COMPREHENSIVE METABOLIC PANEL
ALT: 21 U/L (ref 0–53)
AST: 14 U/L (ref 0–37)
Albumin: 4.1 g/dL (ref 3.5–5.2)
Alkaline Phosphatase: 81 U/L (ref 39–117)
BUN: 12 mg/dL (ref 6–23)
CALCIUM: 9.3 mg/dL (ref 8.4–10.5)
CHLORIDE: 105 meq/L (ref 96–112)
CO2: 30 meq/L (ref 19–32)
Creat: 0.91 mg/dL (ref 0.50–1.35)
Glucose, Bld: 155 mg/dL — ABNORMAL HIGH (ref 70–99)
POTASSIUM: 4.3 meq/L (ref 3.5–5.3)
SODIUM: 141 meq/L (ref 135–145)
TOTAL PROTEIN: 6.2 g/dL (ref 6.0–8.3)
Total Bilirubin: 1 mg/dL (ref 0.3–1.2)

## 2013-06-30 LAB — TSH: TSH: 0.397 u[IU]/mL (ref 0.350–4.500)

## 2013-06-30 LAB — HEMOGLOBIN A1C
HEMOGLOBIN A1C: 9.7 % — AB (ref <5.7)
Mean Plasma Glucose: 232 mg/dL — ABNORMAL HIGH (ref <117)

## 2013-06-30 MED ORDER — ARMODAFINIL 150 MG PO TABS: 150.0000 mg | ORAL_TABLET | Freq: Every day | ORAL | Status: DC

## 2013-06-30 MED ORDER — NEBIVOLOL HCL 10 MG PO TABS: 10.0000 mg | ORAL_TABLET | Freq: Every day | ORAL | Status: DC

## 2013-06-30 NOTE — Patient Instructions (Signed)
Your physician has recommended you make the following change in your medication: STOP the metoprolol and start the new prescription given for bystolic 10 mg. Start the new prescription given for nuvigil.  Your physician recommends that you return for lab work fasting.  Take your CPAP card reader to [facility] to get a download.  Your physician recommends that you schedule a follow-up appointment in: 2- 3 months.

## 2013-07-02 ENCOUNTER — Other Ambulatory Visit: Payer: Self-pay | Admitting: *Deleted

## 2013-07-02 MED ORDER — BUSPIRONE HCL 10 MG PO TABS: 10.0000 mg | ORAL_TABLET | Freq: Every day | ORAL | Status: DC

## 2013-07-07 ENCOUNTER — Encounter: Payer: Self-pay | Admitting: Physician Assistant

## 2013-07-07 ENCOUNTER — Ambulatory Visit (INDEPENDENT_AMBULATORY_CARE_PROVIDER_SITE_OTHER): Payer: 59 | Admitting: Physician Assistant

## 2013-07-07 VITALS — BP 102/76 | HR 60 | Temp 98.6°F | Resp 18 | Wt 234.0 lb

## 2013-07-07 DIAGNOSIS — A499 Bacterial infection, unspecified: Secondary | ICD-10-CM

## 2013-07-07 DIAGNOSIS — B9689 Other specified bacterial agents as the cause of diseases classified elsewhere: Secondary | ICD-10-CM

## 2013-07-07 DIAGNOSIS — F17200 Nicotine dependence, unspecified, uncomplicated: Secondary | ICD-10-CM

## 2013-07-07 DIAGNOSIS — J988 Other specified respiratory disorders: Secondary | ICD-10-CM

## 2013-07-07 MED ORDER — AZITHROMYCIN 250 MG PO TABS: ORAL_TABLET | ORAL | Status: DC

## 2013-07-07 NOTE — Progress Notes (Signed)
Patient ID: ANTHONEE GELIN MRN: [id number], DOB: 03/21/1951, 63 y.o. Date of Encounter: 07/07/2013, 10:32 AM    Chief Complaint:  Chief Complaint  Patient presents with  . c/o URI    loss voice, congestion, nose very sore     HPI: 63 y.o. year old white male reports that he has been sick for 10 days.  started out with a scratchy throat, then lost his voice. Stayed in bed for couple days. Now still with some hoarseness. Congestion and phlegm in his throat and his chest. Has had no mucus from his nose and no significant congestion in his head and nose. Prior to this illness he was smoking one pack per day.  He has not smoked any since being sick. Says that over the years he has stopped smoking multiple times and then started back.     Home Meds: See attached medication section for any medications that were entered at today's visit. The computer does not put those onto this list.The following list is a list of meds entered prior to today's visit.   Current Outpatient Prescriptions on File Prior to Visit  Medication Sig Dispense Refill  . aspirin EC 81 MG tablet Take 81 mg by mouth daily.      . benazepril (LOTENSIN) 5 MG tablet Take 1 tablet (5 mg total) by mouth at bedtime.  30 tablet  9  . busPIRone (BUSPAR) 10 MG tablet Take 1 tablet (10 mg total) by mouth daily.  30 tablet  11  . clopidogrel (PLAVIX) 75 MG tablet Take 75 mg by mouth daily.      [name] ezetimibe (ZETIA) 10 MG tablet Take 1 tablet (10 mg total) by mouth at bedtime.  30 tablet  6  . insulin detemir (LEVEMIR) 100 UNIT/ML injection Inject 26 Units into the skin at bedtime.      . montelukast (SINGULAIR) 10 MG tablet Take 10 mg by mouth daily.       . nebivolol (BYSTOLIC) 10 MG tablet Take 1 tablet (10 mg total) by mouth daily.  1 tablet  11  . pantoprazole (PROTONIX) 40 MG tablet Take 40 mg by mouth daily.      . rosuvastatin (CRESTOR) 20 MG tablet Take 1 tablet (20 mg total) by mouth at bedtime.  30 tablet  6  . UNABLE  TO FIND CPAP therapy      . Armodafinil (NUVIGIL) 150 MG tablet Take 1 tablet (150 mg total) by mouth daily.  30 tablet  3  . nitroGLYCERIN (NITROSTAT) 0.4 MG SL tablet Place 1 tablet (0.4 mg total) under the tongue every 5 (five) minutes x 3 doses as needed for chest pain.  25 tablet  2   No current facility-administered medications on file prior to visit.    Allergies:  Allergies  Allergen Reactions  . Fish Allergy Itching  . Fish Oil Itching and Rash      Review of Systems: See HPI for pertinent ROS. All other ROS negative.    Physical Exam: Blood pressure 102/76, pulse 60, temperature 98.6 F (37 C), temperature source Oral, resp. rate 18, weight 234 lb (106.142 kg)., Body mass index is 31.73 kg/(m^2). General: WNWD WM Appears in no acute distress. HEENT: Normocephalic, atraumatic, eyes without discharge, sclera non-icteric, nares are without discharge. Bilateral auditory canals clear, TM's are without perforation, pearly grey and translucent with reflective cone of light bilaterally. Oral cavity moist, posterior pharynx without exudate, erythema, peritonsillar abscess, or post nasal drip.  Neck:  Supple. No thyromegaly. No lymphadenopathy. Lungs: Clear bilaterally to auscultation without wheezes, rales, or rhonchi. Breathing is unlabored. Lungs are clear with no wheezes. Heart: Regular rhythm. No murmurs, rubs, or gallops. Msk:  Strength and tone normal for age. Extremities/Skin: Warm and dry. No clubbing or cyanosis. No edema. No rashes or suspicious lesions. Neuro: Alert and oriented X 3. Moves all extremities spontaneously. Gait is normal. CNII-XII grossly in tact. Psych:  Responds to questions appropriately with a normal affect.     ASSESSMENT AND PLAN:  63 y.o. year old male with  1. Bacterial respiratory infection - azithromycin (ZITHROMAX) 250 MG tablet; Day 1: Take 2 daily.  Days 2-5: Take 1 daily.  Dispense: 6 tablet; Refill: 0 Followup if symptoms do not resolve  within one week after completing antibiotic.  2. Smoking, quit one week ago Discussed with him that I could give him some medication that would help decrease the cravings and help him stay off of the cigarettes. He is adamant that he can stop smoking by himself and stay off of it himself. However, he tells me that over the years he has quit multiple times and then started back. Told him to call me if he finds himself picking up another cigarettes or having severe cravings.   Signed, [address] [place], [place], [facility] 07/07/2013 10:32 AM

## 2013-07-09 ENCOUNTER — Telehealth: Payer: Self-pay | Admitting: Cardiovascular Disease

## 2013-07-09 NOTE — Telephone Encounter (Signed)
Need to clarify the quainity of his Bystolic 10 mg,it said #1 and should it be #30?

## 2013-07-10 MED ORDER — NEBIVOLOL HCL 10 MG PO TABS: 10.0000 mg | ORAL_TABLET | Freq: Every day | ORAL | Status: DC

## 2013-07-10 NOTE — Telephone Encounter (Signed)
Rx corrected.

## 2013-07-23 ENCOUNTER — Encounter: Payer: Self-pay | Admitting: Cardiovascular Disease

## 2013-07-23 DIAGNOSIS — G4733 Obstructive sleep apnea (adult) (pediatric): Secondary | ICD-10-CM | POA: Insufficient documentation

## 2013-07-23 DIAGNOSIS — Z9989 Dependence on other enabling machines and devices: Secondary | ICD-10-CM

## 2013-07-23 DIAGNOSIS — G4719 Other hypersomnia: Secondary | ICD-10-CM | POA: Insufficient documentation

## 2013-07-23 NOTE — Progress Notes (Signed)
Patient ID: John [name], male   DOB: 21-Oct-1950, 63 y.o.   MRN: [id number]     HPI: John [name] is a 63 y.o. male who presents for cardiology followup evaluation. He is a 4 patient of Dr. Rollene Fare and I had seen in the past for his obstructive sleep apnea.  [name] has known coronary artery disease and underwent multiple percutaneous cardiac interventions with stenting to circumflex and right coronary artery. His last cardiac catheterization was done by [name] in September 2013 which showed his RCA stented the pain but he did have 40% narrowing in the very distal aspect. He has a history of hypertension, as well as long-standing tobacco use with mild polycythemia. I had seen him in a sleep clinic after he obtained a new CPAP machine last year. He did have severe sleep apnea with an AHI on his initial diagnostic study of 44 per hour overall and 72.6 per hour with REM sleep. When I see him in September, subsequent download showed excellent benefit from CPAP with an AHI of 3.1.  From a cardiac perspective, he has been without anginal symptoms. His initial stent to his RCA was in 1996 which was a lipoma shots 1540 sent. In 1999 he underwent non-DS stenting to circumflex he presented with a posterior MI. He said additional 4-5 stents placed in his right coronary artery.  He does have a history of prior back surgery and has had a left foot drop in his [place] Kevlar brace for this.  Presently, despite using his CPAP therapy he doesn't do to significant fatigue and residual daytime sleepiness. He works for [employer]. He does travel. He typically goes to bed at 10 PM and wakes up at 6:30 AM and still feels groggy. He has to take a nap daily. He is traveling on the road every day.  Past Medical History  Diagnosis Date  . Coronary artery disease   . Myocardial infarction     posterior MI  . Shortness of breath   . Sleep apnea     on CPAP; 04/28/2007 split-night - AHI during total sleep  44.43/hr and REM 72.56/hr  . Hypertension   . Hyperlipidemia   . Diabetes mellitus   . GERD (gastroesophageal reflux disease)   . Left foot drop     r/t past disk srugery - uses Kevlar brace  . History of nuclear stress test 04/04/2011    lexiscan; mod-large in size fixed inferolateral defect (scar); non-diagnostic for ischemia; low risk scan     Past Surgical History  Procedure Laterality Date  . Back surgery  1985  . Transthoracic echocardiogram  07/29/2010    EF 50=55%, mod inf wall hypokinesis & mild post wall hypokinesis; LA mild-mod dilated; mild mitral annular calcif & mild MR; mild TR & elevated RV systolic pressure; AV mildly sclerotic; mild aortic root dilatation   . Cardiac catheterization  2010    6 stents total  . Coronary angioplasty with stent placement  03/1994    angioplasty & stenting (non-DES) of circumflex/prox ramus intermedius  . Coronary angioplasty with stent placement  10/1994    large iliac [id number] stent to RCA  . Coronary angioplasty  09/1998    mid-distal RCA balloon dilatation, 4.5 & 5.0 stents   . Cardiac catheterization  01/2000    percutaneous transluminal coronary balloon angioplasty of mid RCA stenotic lesion  . Coronary angioplasty with stent placement  12/2002    4.44mm stents x2 of RCA  . Coronary angioplasty  with stent placement  01/2005    cutting balloon arthrectomy of distal RCA & Cypher DES 3.5x13; cutting balloon arthrectomy of mid RCA with Cypher DES 3.5x18  . Cardiac catheterization  06/2006    no stenting; ischemic cardiomyopathy, EF 40-45%  . Coronary angioplasty with stent placement  11/2008    stenting of mid RCA with 4.0x27mm driver, non-DES    Allergies  Allergen Reactions  . Fish Allergy Itching  . Fish Oil Itching and Rash    Current Outpatient Prescriptions  Medication Sig Dispense Refill  . aspirin EC 81 MG tablet Take 81 mg by mouth daily.      . benazepril (LOTENSIN) 5 MG tablet Take 1 tablet (5 mg total) by mouth at bedtime.   30 tablet  9  . clopidogrel (PLAVIX) 75 MG tablet Take 75 mg by mouth daily.      [name] ezetimibe (ZETIA) 10 MG tablet Take 1 tablet (10 mg total) by mouth at bedtime.  30 tablet  6  . insulin detemir (LEVEMIR) 100 UNIT/ML injection Inject 26 Units into the skin at bedtime.      . montelukast (SINGULAIR) 10 MG tablet Take 10 mg by mouth daily.       . pantoprazole (PROTONIX) 40 MG tablet Take 40 mg by mouth daily.      . rosuvastatin (CRESTOR) 20 MG tablet Take 1 tablet (20 mg total) by mouth at bedtime.  30 tablet  6  . UNABLE TO FIND CPAP therapy      . Armodafinil (NUVIGIL) 150 MG tablet Take 1 tablet (150 mg total) by mouth daily.  30 tablet  3  . azithromycin (ZITHROMAX) 250 MG tablet Day 1: Take 2 daily.  Days 2-5: Take 1 daily.  6 tablet  0  . busPIRone (BUSPAR) 10 MG tablet Take 1 tablet (10 mg total) by mouth daily.  30 tablet  11  . nebivolol (BYSTOLIC) 10 MG tablet Take 1 tablet (10 mg total) by mouth daily.  30 tablet  11  . nitroGLYCERIN (NITROSTAT) 0.4 MG SL tablet Place 1 tablet (0.4 mg total) under the tongue every 5 (five) minutes x 3 doses as needed for chest pain.  25 tablet  2   No current facility-administered medications for this visit.    History   Social History  . Marital Status: Married    Spouse Name: N/A    Number of Children: 3  . Years of Education: N/A   Occupational History  . 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 [place]   Social History Main Topics  . Smoking status: Current Some Day Smoker -- 1.00 packs/day for 50 years    Types: Cigarettes  . Smokeless tobacco: Never Used  . Alcohol Use: Yes     Comment: occasionally  . Drug Use: No  . Sexual Activity: Yes   Other Topics Concern  . Not on file   Social History Narrative  . No narrative on file   Social history is known that he works as a Freight forwarder for [employer]. There is a long-standing tobacco history. He rarely drinks alcohol.  Family History  Problem Relation Age of Onset  . Heart  attack Father     ROS is negative for fevers, chills or night sweats. He denies change in vision or hearing. He denies headaches. He denies palpitations. There is no wheezing. He denies chest pressure. He denies nausea vomiting or diarrhea. He denies blood in stool or urine. He denies  significant leg swelling. There is no claudication. He denies tremors. There is no bruxism. He denies restless legs.  Other comprehensive 14 point system review is negative.  PE BP 138/72  Pulse 75  Ht 6' (1.829 m)  Wt 231 lb 4.8 oz (104.917 kg)  BMI 31.36 kg/m2  General: Alert, oriented, no distress.  Skin: normal turgor, no rashes HEENT: Normocephalic, atraumatic. Pupils round and reactive; sclera anicteric;no lid lag. Extraocular muscles intact. Nose without nasal septal hypertrophy Mouth/Parynx benign; Mallinpatti scale 3 Neck: No JVD, no carotid bruits; normal carotid upstroke Lungs: clear to ausculatation and percussion; no wheezing or rales Chest wall: no tenderness to palpitation Heart: RRR, s1 s2 normal 1/6 systolic murmur. No rubs thrills or heaves Abdomen: soft, nontender; no hepatosplenomehaly, BS+; abdominal aorta nontender and not dilated by palpation. Back: no CVA tenderness Pulses 2+ Extremities: no clubbing cyanosis or edema, Homan's sign negative  Neurologic: grossly nonfocal; cranial nerves grossly normal. Psychologic: normal affect and mood.  ECG (independently read by me): Normal sinus rhythm at 75 beats per minute. Old inferior infarction with inferior Q waves. No other ST-T changes  LABS:  BMET    Component Value Date/Time   NA 141 06/30/2013 0952   K 4.3 06/30/2013 0952   CL 105 06/30/2013 0952   CO2 30 06/30/2013 0952   GLUCOSE 155* 06/30/2013 0952   BUN 12 06/30/2013 0952   CREATININE 0.91 06/30/2013 0952   CREATININE 0.94 08/19/2012 0508   CALCIUM 9.3 06/30/2013 0952   GFRNONAA 87* 08/19/2012 0508   GFRAA >90 08/19/2012 0508     Hepatic Function Panel     Component Value  Date/Time   PROT 6.2 06/30/2013 0952   ALBUMIN 4.1 06/30/2013 0952   AST 14 06/30/2013 0952   ALT 21 06/30/2013 0952   ALKPHOS 81 06/30/2013 0952   BILITOT 1.0 06/30/2013 0952   BILIDIR 0.1 01/09/2007 1511   IBILI 0.5 01/09/2007 1511     CBC    Component Value Date/Time   WBC 12.0* 06/30/2013 0952   RBC 5.65 06/30/2013 0952   HGB 17.4* 06/30/2013 0952   HCT 50.0 06/30/2013 0952   PLT 186 06/30/2013 0952   MCV 88.5 06/30/2013 0952   MCH 30.8 06/30/2013 0952   MCHC 34.8 06/30/2013 0952   RDW 13.3 06/30/2013 0952   LYMPHSABS 2.1 08/18/2012 1945   MONOABS 0.4 08/18/2012 1945   EOSABS 0.2 08/18/2012 1945   BASOSABS 0.0 08/18/2012 1945     BNP No results found for this basename: probnp    Lipid Panel     Component Value Date/Time   CHOL 84 11/15/2012 0756   TRIG 81 11/15/2012 0756   HDL 28* 11/15/2012 0756   CHOLHDL 3.0 11/15/2012 0756   VLDL 16 11/15/2012 0756   LDLCALC 40 11/15/2012 0756     RADIOLOGY: No results found.    ASSESSMENT AND PLAN:  Mr. Vondrak has established coronary artery disease and has undergone prior extensive stenting to his right coronary artery and also stenting to his circumflex vessel. An echo Doppler study in 2013 showed ejection fraction of 45-50%. He did have severe inferior, inferolateral and inferoseptal hypokinesis in the basal inferolateral wall segments are thickened consistent with scar. He does have mild MR mild TR. His last cardiac catheterization was done but by [name] in September 2013. He is using his CPAP therapy with 100% compliance but unfortunately still has significant residual daytime sleepiness. I am electing to change his metoprolol succinate 2 Bystolic 10 mg  to see if this improves some of his fatigue. In light of his need for travel I am recommending a trial of either Provigil or Nuvigil based on what his insurance will cover to see if this improves residual daytime sleepiness. I will see him in the office in 3 months for followup evaluation and  further recommendations at that time. We will repeat laboratory consisting of a CBC chemistry profile thyroid function studies in addition to hemoglobin A1c. His lipids from May 2014 were excellent as noted above.   Troy Sine, MD, [facility]  07/23/2013 6:24 PM

## 2013-08-20 ENCOUNTER — Other Ambulatory Visit: Payer: Self-pay

## 2013-08-20 MED ORDER — PANTOPRAZOLE SODIUM 40 MG PO TBEC: 40.0000 mg | DELAYED_RELEASE_TABLET | Freq: Every day | ORAL | Status: DC

## 2013-08-20 NOTE — Telephone Encounter (Signed)
Rx was sent to pharmacy electronically. 

## 2013-08-29 ENCOUNTER — Other Ambulatory Visit: Payer: Self-pay | Admitting: Family Medicine

## 2013-08-29 NOTE — Telephone Encounter (Signed)
Refill appropriate and filled per protocol. 

## 2013-10-24 ENCOUNTER — Other Ambulatory Visit: Payer: Self-pay | Admitting: Physician Assistant

## 2013-10-24 NOTE — Telephone Encounter (Signed)
Test strips refilled x 1 year

## 2013-10-27 ENCOUNTER — Other Ambulatory Visit: Payer: Self-pay

## 2013-10-27 MED ORDER — ROSUVASTATIN CALCIUM 20 MG PO TABS: 20.0000 mg | ORAL_TABLET | Freq: Every day | ORAL | Status: DC

## 2013-10-27 MED ORDER — EZETIMIBE 10 MG PO TABS: 10.0000 mg | ORAL_TABLET | Freq: Every day | ORAL | Status: DC

## 2013-10-27 NOTE — Telephone Encounter (Signed)
Rx was sent to pharmacy electronically. 

## 2013-11-25 ENCOUNTER — Telehealth: Payer: Self-pay | Admitting: Cardiovascular Disease

## 2013-11-25 MED ORDER — NITROGLYCERIN 0.4 MG SL SUBL: 0.4000 mg | SUBLINGUAL_TABLET | SUBLINGUAL | Status: DC | PRN

## 2013-11-25 NOTE — Telephone Encounter (Signed)
Rx was sent to pharmacy electronically.  Patient also wanted to know when he should see Dr. Claiborne Billings again (of note, last OV jan 2015 and AVS said f/up 2-3 months but recall was for sleep clinic for march 2015)   Patient is NOT having any issues and does not feel he needs to be seen, when RN offered a 6 month check up, and stated he would rather f/up in 1 year from last OV. Will notify scheduler to put in recall.

## 2013-11-25 NOTE — Telephone Encounter (Signed)
Need refill on his Nitro Stat 0.4 mg please.

## 2013-11-27 ENCOUNTER — Encounter (INDEPENDENT_AMBULATORY_CARE_PROVIDER_SITE_OTHER): Payer: 59 | Admitting: Ophthalmology

## 2013-11-27 DIAGNOSIS — H26499 Other secondary cataract, unspecified eye: Secondary | ICD-10-CM

## 2013-11-27 DIAGNOSIS — H33309 Unspecified retinal break, unspecified eye: Secondary | ICD-10-CM

## 2013-11-27 DIAGNOSIS — H43819 Vitreous degeneration, unspecified eye: Secondary | ICD-10-CM

## 2013-11-27 DIAGNOSIS — I10 Essential (primary) hypertension: Secondary | ICD-10-CM

## 2013-11-27 DIAGNOSIS — H35039 Hypertensive retinopathy, unspecified eye: Secondary | ICD-10-CM

## 2013-11-27 DIAGNOSIS — H35379 Puckering of macula, unspecified eye: Secondary | ICD-10-CM

## 2013-12-04 ENCOUNTER — Ambulatory Visit (INDEPENDENT_AMBULATORY_CARE_PROVIDER_SITE_OTHER): Payer: 59 | Admitting: Ophthalmology

## 2013-12-09 ENCOUNTER — Other Ambulatory Visit: Payer: Self-pay | Admitting: *Deleted

## 2013-12-09 MED ORDER — CLOPIDOGREL BISULFATE 75 MG PO TABS: 75.0000 mg | ORAL_TABLET | Freq: Every day | ORAL | Status: DC

## 2013-12-09 NOTE — Telephone Encounter (Signed)
Rx refill sent to patient pharmacy electronically  

## 2013-12-12 ENCOUNTER — Other Ambulatory Visit: Payer: Self-pay | Admitting: Family Medicine

## 2013-12-25 ENCOUNTER — Other Ambulatory Visit: Payer: 59

## 2013-12-25 DIAGNOSIS — E119 Type 2 diabetes mellitus without complications: Secondary | ICD-10-CM

## 2013-12-25 DIAGNOSIS — I10 Essential (primary) hypertension: Secondary | ICD-10-CM

## 2013-12-25 DIAGNOSIS — E785 Hyperlipidemia, unspecified: Secondary | ICD-10-CM

## 2013-12-25 DIAGNOSIS — Z79899 Other long term (current) drug therapy: Secondary | ICD-10-CM

## 2013-12-25 LAB — COMPREHENSIVE METABOLIC PANEL
ALT: 23 U/L (ref 0–53)
AST: 20 U/L (ref 0–37)
Albumin: 3.9 g/dL (ref 3.5–5.2)
Alkaline Phosphatase: 60 U/L (ref 39–117)
BILIRUBIN TOTAL: 0.8 mg/dL (ref 0.2–1.2)
BUN: 15 mg/dL (ref 6–23)
CHLORIDE: 106 meq/L (ref 96–112)
CO2: 28 meq/L (ref 19–32)
CREATININE: 1.05 mg/dL (ref 0.50–1.35)
Calcium: 9.4 mg/dL (ref 8.4–10.5)
Glucose, Bld: 145 mg/dL — ABNORMAL HIGH (ref 70–99)
Potassium: 4.7 mEq/L (ref 3.5–5.3)
Sodium: 143 mEq/L (ref 135–145)
Total Protein: 5.8 g/dL — ABNORMAL LOW (ref 6.0–8.3)

## 2013-12-25 LAB — CBC WITH DIFFERENTIAL/PLATELET
BASOS PCT: 1 % (ref 0–1)
Basophils Absolute: 0.1 10*3/uL (ref 0.0–0.1)
EOS ABS: 0.2 10*3/uL (ref 0.0–0.7)
EOS PCT: 3 % (ref 0–5)
HEMATOCRIT: 49.7 % (ref 39.0–52.0)
Hemoglobin: 16.9 g/dL (ref 13.0–17.0)
Lymphocytes Relative: 22 % (ref 12–46)
Lymphs Abs: 1.7 10*3/uL (ref 0.7–4.0)
MCH: 30.2 pg (ref 26.0–34.0)
MCHC: 34 g/dL (ref 30.0–36.0)
MCV: 88.9 fL (ref 78.0–100.0)
MONO ABS: 0.5 10*3/uL (ref 0.1–1.0)
MONOS PCT: 6 % (ref 3–12)
Neutro Abs: 5.4 10*3/uL (ref 1.7–7.7)
Neutrophils Relative %: 68 % (ref 43–77)
Platelets: 172 10*3/uL (ref 150–400)
RBC: 5.59 MIL/uL (ref 4.22–5.81)
RDW: 13.6 % (ref 11.5–15.5)
WBC: 7.9 10*3/uL (ref 4.0–10.5)

## 2013-12-25 LAB — LIPID PANEL
CHOL/HDL RATIO: 2.8 ratio
CHOLESTEROL: 75 mg/dL (ref 0–200)
HDL: 27 mg/dL — AB (ref >39)
LDL Cholesterol: 32 mg/dL (ref 0–99)
Triglycerides: 79 mg/dL (ref <150)
VLDL: 16 mg/dL (ref 0–40)

## 2013-12-25 LAB — HEMOGLOBIN A1C
Hgb A1c MFr Bld: 8.1 % — ABNORMAL HIGH (ref <5.7)
Mean Plasma Glucose: 186 mg/dL — ABNORMAL HIGH (ref <117)

## 2014-01-02 ENCOUNTER — Encounter: Payer: Self-pay | Admitting: Family Medicine

## 2014-01-02 ENCOUNTER — Ambulatory Visit (INDEPENDENT_AMBULATORY_CARE_PROVIDER_SITE_OTHER): Payer: 59 | Admitting: Family Medicine

## 2014-01-02 VITALS — BP 106/60 | HR 78 | Temp 98.8°F | Resp 14 | Ht 72.0 in | Wt 235.0 lb

## 2014-01-02 DIAGNOSIS — E119 Type 2 diabetes mellitus without complications: Secondary | ICD-10-CM | POA: Diagnosis not present

## 2014-01-02 DIAGNOSIS — E785 Hyperlipidemia, unspecified: Secondary | ICD-10-CM | POA: Diagnosis not present

## 2014-01-02 DIAGNOSIS — Z23 Encounter for immunization: Secondary | ICD-10-CM

## 2014-01-02 DIAGNOSIS — I10 Essential (primary) hypertension: Secondary | ICD-10-CM

## 2014-01-02 NOTE — Progress Notes (Signed)
Subjective:    Patient ID: John [name], male    DOB: 08/24/1950, 63 y.o.   MRN: [id number]  HPI Patient has a history of insulin-dependent diabetes mellitus, hypertension, hyperlipidemia, and coronary artery disease. In January his hemoglobin A1c was elevated at 9.7. 2 dietary and lifestyle changes he is unable to drop to 8.1. His overall dose of Levemir is unchanged. He continues to take 26 units daily. He reports his fasting blood sugars are typically 90 to 115.  History of postprandial sugars are less than 160. This leads me to believe that is 8.1 hemoglobin A1c is likely due to the hyperglycemia around the time that he eats. He denies any chest pain shortness of breath or dyspnea on exertion. Patient has had Pneumovax 23. He is over due for Prevnar 13.   Past Medical History  Diagnosis Date  . Coronary artery disease   . Myocardial infarction     posterior MI  . Shortness of breath   . Sleep apnea     on CPAP; 04/28/2007 split-night - AHI during total sleep 44.43/hr and REM 72.56/hr  . Hypertension   . Hyperlipidemia   . Diabetes mellitus   . GERD (gastroesophageal reflux disease)   . Left foot drop     r/t past disk srugery - uses Kevlar brace  . History of nuclear stress test 04/04/2011    lexiscan; mod-large in size fixed inferolateral defect (scar); non-diagnostic for ischemia; low risk scan    Current Outpatient Prescriptions on File Prior to Visit  Medication Sig Dispense Refill  . benazepril (LOTENSIN) 5 MG tablet Take 1 tablet (5 mg total) by mouth at bedtime.  30 tablet  9  . busPIRone (BUSPAR) 10 MG tablet Take 1 tablet (10 mg total) by mouth daily.  30 tablet  11  . clopidogrel (PLAVIX) 75 MG tablet Take 1 tablet (75 mg total) by mouth daily.  30 tablet  6  . ezetimibe (ZETIA) 10 MG tablet Take 1 tablet (10 mg total) by mouth at bedtime.  30 tablet  8  . LEVEMIR FLEXTOUCH 100 UNIT/ML Pen INJECT 26 UNITS AT BEDTIME  15 pen  6  . montelukast (SINGULAIR) 10 MG tablet  TAKE 1 TABLET EVERY DAY  30 tablet  11  . nitroGLYCERIN (NITROSTAT) 0.4 MG SL tablet Place 1 tablet (0.4 mg total) under the tongue every 5 (five) minutes x 3 doses as needed for chest pain.  25 tablet  3  . ONE TOUCH ULTRA TEST test strip TEST THREE TIMES A DAY.  100 each  11  . pantoprazole (PROTONIX) 40 MG tablet Take 1 tablet (40 mg total) by mouth daily.  30 tablet  10  . rosuvastatin (CRESTOR) 20 MG tablet Take 1 tablet (20 mg total) by mouth at bedtime.  30 tablet  8  . UNABLE TO FIND CPAP therapy       No current facility-administered medications on file prior to visit.   Allergies  Allergen Reactions  . Fish Allergy Itching  . Fish Oil Itching and Rash   Past Surgical History  Procedure Laterality Date  . Back surgery  1985  . Transthoracic echocardiogram  07/29/2010    EF 50=55%, mod inf wall hypokinesis & mild post wall hypokinesis; LA mild-mod dilated; mild mitral annular calcif & mild MR; mild TR & elevated RV systolic pressure; AV mildly sclerotic; mild aortic root dilatation   . Cardiac catheterization  2010    6 stents total  . Coronary angioplasty  with stent placement  03/1994    angioplasty & stenting (non-DES) of circumflex/prox ramus intermedius  . Coronary angioplasty with stent placement  10/1994    large iliac [id number] stent to RCA  . Coronary angioplasty  09/1998    mid-distal RCA balloon dilatation, 4.5 & 5.0 stents   . Cardiac catheterization  01/2000    percutaneous transluminal coronary balloon angioplasty of mid RCA stenotic lesion  . Coronary angioplasty with stent placement  12/2002    4.62m stents x2 of RCA  . Coronary angioplasty with stent placement  01/2005    cutting balloon arthrectomy of distal RCA & Cypher DES 3.5x13; cutting balloon arthrectomy of mid RCA with Cypher DES 3.5x18  . Cardiac catheterization  06/2006    no stenting; ischemic cardiomyopathy, EF 40-45%  . Coronary angioplasty with stent placement  11/2008    stenting of mid RCA with  4.0x173mdriver, non-DES   History   Social History  . Marital Status: Married    Spouse Name: N/A    Number of Children: 3  . Years of Education: N/A   Occupational History  . 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 Smoking status: Current Some Day Smoker -- 1.00 packs/day for 50 years    Types: Cigarettes  . Smokeless tobacco: Never Used  . Alcohol Use: Yes     Comment: occasionally  . Drug Use: No  . Sexual Activity: Yes   Other Topics Concern  . Not on file   Social History Narrative  . No narrative on file      Review of Systems  All other systems reviewed and are negative.      Objective:   Physical Exam  Vitals reviewed. Constitutional: He is oriented to person, place, and time. He appears well-developed and well-nourished. No distress.  Cardiovascular: Normal rate, regular rhythm and normal heart sounds.  Exam reveals no friction rub.   No murmur heard. Pulmonary/Chest: Effort normal and breath sounds normal. No respiratory distress. He has no wheezes. He has no rales. He exhibits no tenderness.  Abdominal: Soft. Bowel sounds are normal. He exhibits no distension. There is no tenderness. There is no rebound and no guarding.  Musculoskeletal: He exhibits no edema.  Neurological: He is alert and oriented to person, place, and time. He has normal reflexes. No cranial nerve deficit. He exhibits normal muscle tone. Coordination normal.  Skin: He is not diaphoretic.    Lab on 12/25/2013  Component Date Value Ref Range Status  . Sodium 12/25/2013 143  135 - 145 mEq/L Final  . Potassium 12/25/2013 4.7  3.5 - 5.3 mEq/L Final  . Chloride 12/25/2013 106  96 - 112 mEq/L Final  . CO2 12/25/2013 28  19 - 32 mEq/L Final  . Glucose, Bld 12/25/2013 145* 70 - 99 mg/dL Final  . BUN 12/25/2013 15  6 - 23 mg/dL Final  . Creat 12/25/2013 1.05  0.50 - 1.35 mg/dL Final  . Total Bilirubin 12/25/2013 0.8  0.2 - 1.2 mg/dL Final  .  Alkaline Phosphatase 12/25/2013 60  39 - 117 U/L Final  . AST 12/25/2013 20  0 - 37 U/L Final  . ALT 12/25/2013 23  0 - 53 U/L Final  . Total Protein 12/25/2013 5.8* 6.0 - 8.3 g/dL Final  . Albumin 12/25/2013 3.9  3.5 - 5.2 g/dL Final  . Calcium 12/25/2013 9.4  8.4 - 10.5 mg/dL Final  . Cholesterol 12/25/2013  75  0 - 200 mg/dL Final   Comment: ATP III Classification:                                < 200        mg/dL        Desirable                               200 - 239     mg/dL        Borderline High                               >= 240        mg/dL        High                             . Triglycerides 12/25/2013 79  <150 mg/dL Final  . HDL 12/25/2013 27* >39 mg/dL Final  . Total CHOL/HDL Ratio 12/25/2013 2.8   Final  . VLDL 12/25/2013 16  0 - 40 mg/dL Final  . LDL Cholesterol 12/25/2013 32  0 - 99 mg/dL Final   Comment:                            Total Cholesterol/HDL Ratio:CHD Risk                                                 Coronary Heart Disease Risk Table                                                                 Men       Women                                   1/2 Average Risk              3.4        3.3                                       Average Risk              5.0        4.4                                    2X Average Risk              9.6        7.1  3X Average Risk             23.4       11.0                          Use the calculated Patient Ratio above and the CHD Risk table                           to determine the patient's CHD Risk.                          ATP III Classification (LDL):                                < 100        mg/dL         Optimal                               100 - 129     mg/dL         Near or Above Optimal                               130 - 159     mg/dL         Borderline High                               160 - 189     mg/dL         High                                > 190         mg/dL         Very High                             . Hemoglobin A1C 12/25/2013 8.1* <5.7 % Final   Comment:                                                                                                 According to the ADA Clinical Practice Recommendations for 2011, when                          HbA1c is used as a screening test:                                                       >=6.5%  Diagnostic of Diabetes Mellitus                                     (if abnormal result is confirmed)                                                     5.7-6.4%   Increased risk of developing Diabetes Mellitus                                                     References:Diagnosis and Classification of Diabetes Mellitus,Diabetes                          NIOE,7035,00(XFGHW 1):S62-S69 and Standards of Medical Care in                                  Diabetes - 2011,Diabetes Care,2011,34 (Suppl 1):S11-S61.                             . Mean Plasma Glucose 12/25/2013 186* <117 mg/dL Final  . WBC 12/25/2013 7.9  4.0 - 10.5 K/uL Final  . RBC 12/25/2013 5.59  4.22 - 5.81 MIL/uL Final  . Hemoglobin 12/25/2013 16.9  13.0 - 17.0 g/dL Final  . HCT 12/25/2013 49.7  39.0 - 52.0 % Final  . MCV 12/25/2013 88.9  78.0 - 100.0 fL Final  . MCH 12/25/2013 30.2  26.0 - 34.0 pg Final  . MCHC 12/25/2013 34.0  30.0 - 36.0 g/dL Final  . RDW 12/25/2013 13.6  11.5 - 15.5 % Final  . Platelets 12/25/2013 172  150 - 400 K/uL Final  . Neutrophils Relative % 12/25/2013 68  43 - 77 % Final  . Neutro Abs 12/25/2013 5.4  1.7 - 7.7 K/uL Final  . Lymphocytes Relative 12/25/2013 22  12 - 46 % Final  . Lymphs Abs 12/25/2013 1.7  0.7 - 4.0 K/uL Final  . Monocytes Relative 12/25/2013 6  3 - 12 % Final  . Monocytes Absolute 12/25/2013 0.5  0.1 - 1.0 K/uL Final  . Eosinophils Relative 12/25/2013 3  0 - 5 % Final  . Eosinophils Absolute 12/25/2013 0.2  0.0 - 0.7 K/uL Final  . Basophils Relative 12/25/2013 1  0 - 1 % Final  .  Basophils Absolute 12/25/2013 0.1  0.0 - 0.1 K/uL Final  . Smear Review 12/25/2013 Criteria for review not met   Final         Assessment & Plan:  1. Type II or unspecified type diabetes mellitus without mention of complication, not stated as uncontrolled I discussed adding Actos versus Januvia for postprandial hyperglycemia.  Patient like to try therapeutic lifestyle changes first. He is about to retire and he believes he would be a loose 15 pounds in the next 3 months. He believes he was able to change his diet substantially. He like to recheck his A1c in December. If it is still greater than 7, I recommend  Actos.  2. Essential hypertension Blood pressure is well controlled. I made no changes in his medication at this time. 3. HLD (hyperlipidemia) HDL is very low. I recommended increasing aerobic exercise and rechecking fasting lipid panel in December. Consider Niaspan.

## 2014-01-02 NOTE — Addendum Note (Signed)
Addended by: Shary Decamp B on: 01/02/2014 03:44 PM   Modules accepted: Orders

## 2014-02-05 ENCOUNTER — Encounter: Payer: Self-pay | Admitting: Family Medicine

## 2014-02-05 ENCOUNTER — Ambulatory Visit (INDEPENDENT_AMBULATORY_CARE_PROVIDER_SITE_OTHER): Payer: Medicare Other | Admitting: Family Medicine

## 2014-02-05 VITALS — BP 110/74 | HR 68 | Temp 98.8°F | Resp 16 | Ht 72.0 in | Wt 230.0 lb

## 2014-02-05 DIAGNOSIS — D489 Neoplasm of uncertain behavior, unspecified: Secondary | ICD-10-CM | POA: Diagnosis not present

## 2014-02-05 DIAGNOSIS — I2510 Atherosclerotic heart disease of native coronary artery without angina pectoris: Secondary | ICD-10-CM | POA: Diagnosis not present

## 2014-02-05 DIAGNOSIS — C4492 Squamous cell carcinoma of skin, unspecified: Secondary | ICD-10-CM | POA: Diagnosis not present

## 2014-02-05 DIAGNOSIS — J3089 Other allergic rhinitis: Secondary | ICD-10-CM | POA: Diagnosis not present

## 2014-02-05 DIAGNOSIS — D485 Neoplasm of uncertain behavior of skin: Secondary | ICD-10-CM

## 2014-02-05 MED ORDER — FLUTICASONE PROPIONATE 50 MCG/ACT NA SUSP: 2.0000 | Freq: Every day | NASAL | Status: DC

## 2014-02-05 NOTE — Progress Notes (Signed)
Subjective:    Patient ID: John [name], male    DOB: 1951-04-13, 63 y.o.   MRN: [id number]  HPI  Patient reports a scaly hard papule that is on the crown of his head for the last 5 months. It is approximately 1 cm in size. 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  Patient states it bleeds when it is irritated. He is requesting a biopsy. He also complains of daily allergies. Past Medical History  Diagnosis Date  . Coronary artery disease   . Myocardial infarction     posterior MI  . Shortness of breath   . Sleep apnea     on CPAP; 04/28/2007 split-night - AHI during total sleep 44.43/hr and REM 72.56/hr  . Hypertension   . Hyperlipidemia   . Diabetes mellitus   . GERD (gastroesophageal reflux disease)   . Left foot drop     r/t past disk srugery - uses Kevlar brace  . History of nuclear stress test 04/04/2011    lexiscan; mod-large in size fixed inferolateral defect (scar); non-diagnostic for ischemia; low risk scan    Current Outpatient Prescriptions on File Prior to Visit  Medication Sig Dispense Refill  . aspirin 81 MG tablet Take 81 mg by mouth daily.      . benazepril (LOTENSIN) 5 MG tablet Take 1 tablet (5 mg total) by mouth at bedtime.  30 tablet  9  . busPIRone (BUSPAR) 10 MG tablet Take 1 tablet (10 mg total) by mouth daily.  30 tablet  11  . clopidogrel (PLAVIX) 75 MG tablet Take 1 tablet (75 mg total) by mouth daily.  30 tablet  6  . ezetimibe (ZETIA) 10 MG tablet Take 1 tablet (10 mg total) by mouth at bedtime.  30 tablet  8  . LEVEMIR FLEXTOUCH 100 UNIT/ML Pen INJECT 26 UNITS AT BEDTIME  15 pen  6  . montelukast (SINGULAIR) 10 MG tablet TAKE 1 TABLET EVERY DAY  30 tablet  11  . nebivolol (BYSTOLIC) 10 MG tablet Take 10 mg by mouth daily.      . nitroGLYCERIN (NITROSTAT) 0.4 MG SL tablet Place 1 tablet (0.4 mg total) under the tongue every 5 (five) minutes x 3 doses as needed for chest pain.  25 tablet  3  . ONE TOUCH ULTRA TEST test strip TEST THREE TIMES A DAY.  100  each  11  . pantoprazole (PROTONIX) 40 MG tablet Take 1 tablet (40 mg total) by mouth daily.  30 tablet  10  . rosuvastatin (CRESTOR) 20 MG tablet Take 1 tablet (20 mg total) by mouth at bedtime.  30 tablet  8  . UNABLE TO FIND CPAP therapy       No current facility-administered medications on file prior to visit.   Allergies  Allergen Reactions  . Fish Allergy Itching  . Fish Oil Itching and Rash   History   Social History  . Marital Status: Married    Spouse Name: N/A    Number of Children: 3  . Years of Education: N/A   Occupational History  . Freight forwarder Other    [place] [employer]   Social History Main Topics  . Smoking status: Current Some Day Smoker -- 1.00 packs/day for 50 years    Types: Cigarettes  . Smokeless tobacco: Never Used  . Alcohol Use: Yes     Comment: occasionally  . Drug Use: No  . Sexual Activity: Yes   Other Topics Concern  .  Not on file   Social History Narrative  . No narrative on file     Review of Systems  All other systems reviewed and are negative.      Objective:   Physical Exam  Vitals reviewed.  1 cm scaly hard papule on the crown of his head.        Assessment & Plan:  Other allergic rhinitis - Plan: fluticasone (FLONASE) 50 MCG/ACT nasal spray  Neoplasm of uncertain behavior - Plan: Pathology Report  Lesion was anesthetized with 0.1% lidocaine with epinephrine. Shave biopsy was performed and the lesion was sent to pathology and labeled container. Hemostasis was achieved with Drysol. Followup in one week.

## 2014-02-09 ENCOUNTER — Other Ambulatory Visit: Payer: Self-pay | Admitting: Family Medicine

## 2014-02-09 LAB — PATHOLOGY

## 2014-02-09 MED ORDER — INSULIN DETEMIR 100 UNIT/ML FLEXPEN: PEN_INJECTOR | SUBCUTANEOUS | Status: DC

## 2014-02-09 NOTE — Telephone Encounter (Signed)
Med sent to pharm 

## 2014-02-10 ENCOUNTER — Other Ambulatory Visit: Payer: Self-pay | Admitting: *Deleted

## 2014-02-10 DIAGNOSIS — C4492 Squamous cell carcinoma of skin, unspecified: Secondary | ICD-10-CM

## 2014-02-27 ENCOUNTER — Encounter (INDEPENDENT_AMBULATORY_CARE_PROVIDER_SITE_OTHER): Payer: Medicare Other | Admitting: Ophthalmology

## 2014-02-27 DIAGNOSIS — H33309 Unspecified retinal break, unspecified eye: Secondary | ICD-10-CM | POA: Diagnosis not present

## 2014-02-27 DIAGNOSIS — H35039 Hypertensive retinopathy, unspecified eye: Secondary | ICD-10-CM | POA: Diagnosis not present

## 2014-02-27 DIAGNOSIS — H35379 Puckering of macula, unspecified eye: Secondary | ICD-10-CM | POA: Diagnosis not present

## 2014-02-27 DIAGNOSIS — I10 Essential (primary) hypertension: Secondary | ICD-10-CM | POA: Diagnosis not present

## 2014-02-27 DIAGNOSIS — H43819 Vitreous degeneration, unspecified eye: Secondary | ICD-10-CM

## 2014-03-17 DIAGNOSIS — L570 Actinic keratosis: Secondary | ICD-10-CM | POA: Diagnosis not present

## 2014-03-31 ENCOUNTER — Other Ambulatory Visit: Payer: Self-pay | Admitting: *Deleted

## 2014-03-31 MED ORDER — BENAZEPRIL HCL 5 MG PO TABS: 5.0000 mg | ORAL_TABLET | Freq: Every day | ORAL | Status: DC

## 2014-03-31 NOTE — Telephone Encounter (Signed)
Medication refilled with notation: Pt needs to schedule an appointment in order to receive future refills.

## 2014-04-28 ENCOUNTER — Other Ambulatory Visit: Payer: Self-pay | Admitting: Cardiovascular Disease

## 2014-05-07 ENCOUNTER — Encounter: Payer: Self-pay | Admitting: Physician Assistant

## 2014-05-07 ENCOUNTER — Ambulatory Visit (INDEPENDENT_AMBULATORY_CARE_PROVIDER_SITE_OTHER): Payer: Medicare Other | Admitting: Physician Assistant

## 2014-05-07 VITALS — BP 114/66 | HR 64 | Temp 98.3°F | Resp 18 | Wt 228.0 lb

## 2014-05-07 DIAGNOSIS — M21612 Bunion of left foot: Secondary | ICD-10-CM

## 2014-05-07 DIAGNOSIS — M2012 Hallux valgus (acquired), left foot: Secondary | ICD-10-CM | POA: Diagnosis not present

## 2014-05-07 DIAGNOSIS — I2510 Atherosclerotic heart disease of native coronary artery without angina pectoris: Secondary | ICD-10-CM

## 2014-05-07 NOTE — Progress Notes (Signed)
Patient ID: DEMETRY BENDICKSON MRN: [id number], DOB: 09/23/1950, 63 y.o. Date of Encounter: 05/07/2014, 8:39 AM    Chief Complaint:  Chief Complaint  Patient presents with  . toenail fungus    wants referral to Podiatrist for other foot problems     HPI: 63 y.o. year oldwhite male is here for evaluation of the above issue.  He says that he had this problem evaluated probably about 12 years ago but that they were recommending surgery which would require him to be out of work for 6 weeks and he could not be out of work for 6 weeks back then. He says that he is now retired so that work issue is no longer an issue. As well, he says that he is now having more and more pain and problems secondary to this bunion on the left foot.  Because of the bunion the first toe is being bent towards the second toe. Therefore it is rubbing a sore spot on the side of the second toe. He has been applying a Band-Aid to that area to create a buffer between the toenail and the skin. However, he is diabetic and therefore is very concerned about having this wound on his toe.  As well, he is having a lot of pain in the first toe as well as the ball of the foot.  Additionally he has very thick toenail on the left first toe secondary to toenail fungus. He wasn't sure whether treating this toenail fungus with make much of a difference or not.     Home Meds:   Outpatient Prescriptions Prior to Visit  Medication Sig Dispense Refill  . aspirin 81 MG tablet Take 81 mg by mouth daily.    . benazepril (LOTENSIN) 5 MG tablet TAKE 1 TABLET BY MOUTH AT BEDTIME (NEEDS OFFICE VISIT) 30 tablet 2  . busPIRone (BUSPAR) 10 MG tablet Take 1 tablet (10 mg total) by mouth daily. 30 tablet 11  . clopidogrel (PLAVIX) 75 MG tablet Take 1 tablet (75 mg total) by mouth daily. 30 tablet 6  . ezetimibe (ZETIA) 10 MG tablet Take 1 tablet (10 mg total) by mouth at bedtime. 30 tablet 8  . fluticasone (FLONASE) 50 MCG/ACT nasal spray  Place 2 sprays into both nostrils daily. 16 g 6  . Insulin Detemir (LEVEMIR FLEXTOUCH) 100 UNIT/ML Pen INJECT 26 UNITS AT BEDTIME -Dx - 250.00 15 pen 6  . montelukast (SINGULAIR) 10 MG tablet TAKE 1 TABLET EVERY DAY 30 tablet 11  . nebivolol (BYSTOLIC) 10 MG tablet Take 10 mg by mouth daily.    . nitroGLYCERIN (NITROSTAT) 0.4 MG SL tablet Place 1 tablet (0.4 mg total) under the tongue every 5 (five) minutes x 3 doses as needed for chest pain. 25 tablet 3  . ONE TOUCH ULTRA TEST test strip TEST THREE TIMES A DAY. 100 each 11  . pantoprazole (PROTONIX) 40 MG tablet Take 1 tablet (40 mg total) by mouth daily. 30 tablet 10  . rosuvastatin (CRESTOR) 20 MG tablet Take 1 tablet (20 mg total) by mouth at bedtime. 30 tablet 8  . UNABLE TO FIND CPAP therapy     No facility-administered medications prior to visit.    Allergies:  Allergies  Allergen Reactions  . Fish Allergy Itching  . Fish Oil Itching and Rash      Review of Systems: See HPI for pertinent ROS. All other ROS negative.    Physical Exam: Blood pressure 114/66, pulse 64, temperature 98.3 F (  36.8 C), temperature source Oral, resp. rate 18, weight 228 lb (103.42 kg)., Body mass index is 30.92 kg/(m^2). General:  WNWD WM. Appears in no acute distress. Lungs: Clear bilaterally to auscultation without wheezes, rales, or rhonchi. Breathing is unlabored. Heart: Regular rhythm. No murmurs, rubs, or gallops. Msk:  Strength and tone normal for age. Extremities/Skin:  Bunion of Left foot. Toenail of left 1st toe is opaque golden color. It is approximately 1/2 cm thick. The lateral aspect of the second toe has approximate half centimeter area of erythema but no open wound. Neuro: Alert and oriented X 3.  CNII-XII grossly in tact. Psych:  Responds to questions appropriately with a normal affect.     ASSESSMENT AND PLAN:  63 y.o. year old male with  1. Bunion, left foot Discussed treatment of toenail fungus and the fact of having to  check blood work and the fact that it can affect liver. Also discussed that even if the toenail fungus did resolve, that I am not sure how much this would affect his pain and the wound that is being created to the second toe. Even if the toenail fungus resolved, he still is going to need to see a podiatrist and probably have some type of surgery. After discussion we decided that he would just wait and have the podiatry evaluation and hold off on doing any treatment to the toenail fungus until he sees podiatrist and has their evaluation. In the interim, continue to apply some type of buffer between the first and second toe to prevent further wound to the second toe. Follow-up with [place] immediately if does start to develop more of a wound to that second toe. Otherwise, follow-up with podiatry. - Ambulatory referral to Podiatry   Signed, Olean Ree [place], [place], [facility] 05/07/2014 8:39 AM

## 2014-05-12 ENCOUNTER — Ambulatory Visit: Payer: Medicare Other | Admitting: Podiatry

## 2014-05-18 ENCOUNTER — Ambulatory Visit (INDEPENDENT_AMBULATORY_CARE_PROVIDER_SITE_OTHER): Payer: Medicare Other | Admitting: Family Medicine

## 2014-05-18 ENCOUNTER — Encounter: Payer: Self-pay | Admitting: Family Medicine

## 2014-05-18 VITALS — BP 100/62 | HR 62 | Temp 97.9°F | Resp 18 | Ht 72.0 in | Wt 221.0 lb

## 2014-05-18 DIAGNOSIS — H8111 Benign paroxysmal vertigo, right ear: Secondary | ICD-10-CM

## 2014-05-18 DIAGNOSIS — I2510 Atherosclerotic heart disease of native coronary artery without angina pectoris: Secondary | ICD-10-CM

## 2014-05-18 MED ORDER — MECLIZINE HCL 25 MG PO TABS: 25.0000 mg | ORAL_TABLET | Freq: Three times a day (TID) | ORAL | Status: DC | PRN

## 2014-05-18 NOTE — Progress Notes (Signed)
Subjective:    Patient ID: John [name], male    DOB: 09-Nov-1950, 63 y.o.   MRN: [id number]  HPI  Patient has had symptoms of vertigo now for 1 week. Patient reports the symptoms that the room is spinning around him. The patient is completely still he is asymptomatic. Whenever he turns his head particularly to the right lies down, he rapidly developed symptoms of vertigo. This is associated with nausea. He also reports hearing loss in his right ear and tinnitus. However this is chronic. He does not believe that this has worsened recently. He denies any head injury. He denies any stroke symptoms. He denies any vision changes or other neurologic deficits. His examination today is significant for a positive Dix-Hallpike maneuver to the right side. Past Medical History  Diagnosis Date  . Coronary artery disease   . Myocardial infarction     posterior MI  . Shortness of breath   . Sleep apnea     on CPAP; 04/28/2007 split-night - AHI during total sleep 44.43/hr and REM 72.56/hr  . Hypertension   . Hyperlipidemia   . Diabetes mellitus   . GERD (gastroesophageal reflux disease)   . Left foot drop     r/t past disk srugery - uses Kevlar brace  . History of nuclear stress test 04/04/2011    lexiscan; mod-large in size fixed inferolateral defect (scar); non-diagnostic for ischemia; low risk scan    Past Surgical History  Procedure Laterality Date  . Back surgery  1985  . Transthoracic echocardiogram  07/29/2010    EF 50=55%, mod inf wall hypokinesis & mild post wall hypokinesis; LA mild-mod dilated; mild mitral annular calcif & mild MR; mild TR & elevated RV systolic pressure; AV mildly sclerotic; mild aortic root dilatation   . Cardiac catheterization  2010    6 stents total  . Coronary angioplasty with stent placement  03/1994    angioplasty & stenting (non-DES) of circumflex/prox ramus intermedius  . Coronary angioplasty with stent placement  10/1994    large iliac [id number] stent to RCA    . Coronary angioplasty  09/1998    mid-distal RCA balloon dilatation, 4.5 & 5.0 stents   . Cardiac catheterization  01/2000    percutaneous transluminal coronary balloon angioplasty of mid RCA stenotic lesion  . Coronary angioplasty with stent placement  12/2002    4.68mm stents x2 of RCA  . Coronary angioplasty with stent placement  01/2005    cutting balloon arthrectomy of distal RCA & Cypher DES 3.5x13; cutting balloon arthrectomy of mid RCA with Cypher DES 3.5x18  . Cardiac catheterization  06/2006    no stenting; ischemic cardiomyopathy, EF 40-45%  . Coronary angioplasty with stent placement  11/2008    stenting of mid RCA with 4.0x65mm driver, non-DES   Current Outpatient Prescriptions on File Prior to Visit  Medication Sig Dispense Refill  . aspirin 81 MG tablet Take 81 mg by mouth daily.    . benazepril (LOTENSIN) 5 MG tablet TAKE 1 TABLET BY MOUTH AT BEDTIME (NEEDS OFFICE VISIT) 30 tablet 2  . busPIRone (BUSPAR) 10 MG tablet Take 1 tablet (10 mg total) by mouth daily. 30 tablet 11  . clopidogrel (PLAVIX) 75 MG tablet Take 1 tablet (75 mg total) by mouth daily. 30 tablet 6  . ezetimibe (ZETIA) 10 MG tablet Take 1 tablet (10 mg total) by mouth at bedtime. 30 tablet 8  . fluticasone (FLONASE) 50 MCG/ACT nasal spray Place 2 sprays into both nostrils daily.  16 g 6  . Insulin Detemir (LEVEMIR FLEXTOUCH) 100 UNIT/ML Pen INJECT 26 UNITS AT BEDTIME -Dx - 250.00 15 pen 6  . montelukast (SINGULAIR) 10 MG tablet TAKE 1 TABLET EVERY DAY 30 tablet 11  . nebivolol (BYSTOLIC) 10 MG tablet Take 10 mg by mouth daily.    . nitroGLYCERIN (NITROSTAT) 0.4 MG SL tablet Place 1 tablet (0.4 mg total) under the tongue every 5 (five) minutes x 3 doses as needed for chest pain. 25 tablet 3  . ONE TOUCH ULTRA TEST test strip TEST THREE TIMES A DAY. 100 each 11  . pantoprazole (PROTONIX) 40 MG tablet Take 1 tablet (40 mg total) by mouth daily. 30 tablet 10  . rosuvastatin (CRESTOR) 20 MG tablet Take 1 tablet (20 mg  total) by mouth at bedtime. 30 tablet 8  . UNABLE TO FIND CPAP therapy     No current facility-administered medications on file prior to visit.   Allergies  Allergen Reactions  . Fish Allergy Itching  . Fish Oil Itching and Rash   History   Social History  . Marital Status: Married    Spouse Name: N/A    Number of Children: 3  . Years of Education: N/A   Occupational History  . 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 [place]   Social History Main Topics  . Smoking status: Current Some Day Smoker -- 1.00 packs/day for 50 years    Types: Cigarettes  . Smokeless tobacco: Never Used  . Alcohol Use: Yes     Comment: occasionally  . Drug Use: No  . Sexual Activity: Yes   Other Topics Concern  . Not on file   Social History Narrative       Review of Systems  All other systems reviewed and are negative.      Objective:   Physical Exam  Constitutional: He is oriented to person, place, and time. He appears well-developed and well-nourished.  HENT:  Right Ear: External ear normal.  Left Ear: External ear normal.  Nose: Nose normal.  Mouth/Throat: Oropharynx is clear and moist. No oropharyngeal exudate.  Eyes: Conjunctivae and EOM are normal. Pupils are equal, round, and reactive to light.  Cardiovascular: Normal rate, regular rhythm and normal heart sounds.   No murmur heard. Pulmonary/Chest: Effort normal and breath sounds normal. No respiratory distress. He has no wheezes. He has no rales.  Neurological: He is alert and oriented to person, place, and time. He has normal reflexes. He displays normal reflexes. No cranial nerve deficit. He exhibits normal muscle tone. Coordination normal.  Vitals reviewed.         Assessment & Plan:  BPPV (benign paroxysmal positional vertigo), right - Plan: meclizine (ANTIVERT) 25 MG tablet  Patient's history and exam are consistent with BPPV. I have recommended meclizine 25 mg every 8 hours as needed. I would also keep  Mnire's disease on the differential diagnosis. Recheck in one week if no better or immediately if worse.

## 2014-05-28 ENCOUNTER — Encounter (HOSPITAL_COMMUNITY): Payer: Self-pay | Admitting: Cardiovascular Disease

## 2014-06-04 ENCOUNTER — Ambulatory Visit (INDEPENDENT_AMBULATORY_CARE_PROVIDER_SITE_OTHER): Payer: Medicare Other | Admitting: Family Medicine

## 2014-06-04 ENCOUNTER — Encounter: Payer: Self-pay | Admitting: Family Medicine

## 2014-06-04 VITALS — BP 100/62 | HR 64 | Temp 98.3°F | Resp 16 | Ht 72.0 in | Wt 226.0 lb

## 2014-06-04 DIAGNOSIS — D485 Neoplasm of uncertain behavior of skin: Secondary | ICD-10-CM | POA: Diagnosis not present

## 2014-06-04 DIAGNOSIS — E119 Type 2 diabetes mellitus without complications: Secondary | ICD-10-CM

## 2014-06-04 DIAGNOSIS — Z23 Encounter for immunization: Secondary | ICD-10-CM | POA: Diagnosis not present

## 2014-06-04 DIAGNOSIS — E785 Hyperlipidemia, unspecified: Secondary | ICD-10-CM

## 2014-06-04 DIAGNOSIS — Z794 Long term (current) use of insulin: Secondary | ICD-10-CM

## 2014-06-04 DIAGNOSIS — I2510 Atherosclerotic heart disease of native coronary artery without angina pectoris: Secondary | ICD-10-CM

## 2014-06-04 DIAGNOSIS — I10 Essential (primary) hypertension: Secondary | ICD-10-CM | POA: Diagnosis not present

## 2014-06-04 DIAGNOSIS — IMO0001 Reserved for inherently not codable concepts without codable children: Secondary | ICD-10-CM

## 2014-06-04 LAB — COMPLETE METABOLIC PANEL WITHOUT GFR
ALT: 20 U/L (ref 0–53)
AST: 21 U/L (ref 0–37)
Albumin: 3.7 g/dL (ref 3.5–5.2)
Alkaline Phosphatase: 64 U/L (ref 39–117)
BUN: 13 mg/dL (ref 6–23)
CO2: 25 meq/L (ref 19–32)
Calcium: 9 mg/dL (ref 8.4–10.5)
Chloride: 107 meq/L (ref 96–112)
Creat: 1.01 mg/dL (ref 0.50–1.35)
GFR, Est African American: >89 mL/min
GFR, Est Non African American: 78 mL/min
Glucose, Bld: 113 mg/dL — ABNORMAL HIGH (ref 70–99)
Potassium: 4.6 meq/L (ref 3.5–5.3)
Sodium: 143 meq/L (ref 135–145)
Total Bilirubin: 0.6 mg/dL (ref 0.2–1.2)
Total Protein: 5.8 g/dL — ABNORMAL LOW (ref 6.0–8.3)

## 2014-06-04 LAB — HEMOGLOBIN A1C
HEMOGLOBIN A1C: 6.9 % — AB (ref <5.7)
MEAN PLASMA GLUCOSE: 151 mg/dL — AB (ref <117)

## 2014-06-04 LAB — LIPID PANEL
Cholesterol: 74 mg/dL (ref 0–200)
HDL: 36 mg/dL — ABNORMAL LOW (ref >39)
LDL CALC: 28 mg/dL (ref 0–99)
Total CHOL/HDL Ratio: 2.1 Ratio
Triglycerides: 52 mg/dL (ref <150)
VLDL: 10 mg/dL (ref 0–40)

## 2014-06-04 NOTE — Progress Notes (Signed)
Subjective:    Patient ID: John [name], male    DOB: 1950/07/14, 63 y.o.   MRN: [id number]  HPI Patient's vertigo has improved but is still present. He continues to complain of vertigo when he turns his head to the right. He is mainly here today for follow-up of his insulin-dependent diabetes mellitus. His diabetic foot exam is significant for thick yellow dystrophic toenails on both feet. He has significant onychomycosis. However he has good pulses in his feet and no evidence of neuropathy. He denies any chest pain shortness of breath or dyspnea on exertion. His blood pressures well controlled at 100/62. He denies any myalgias or right upper quadrant pain on his cholesterol medication. He is due today for his flu shot. There is a red macule under his right eye with thick white scale. It is 1 cm in diameter. Past Medical History  Diagnosis Date  . Coronary artery disease   . Myocardial infarction     posterior MI  . Shortness of breath   . Sleep apnea     on CPAP; 04/28/2007 split-night - AHI during total sleep 44.43/hr and REM 72.56/hr  . Hypertension   . Hyperlipidemia   . Diabetes mellitus   . GERD (gastroesophageal reflux disease)   . Left foot drop     r/t past disk srugery - uses Kevlar brace  . History of nuclear stress test 04/04/2011    lexiscan; mod-large in size fixed inferolateral defect (scar); non-diagnostic for ischemia; low risk scan    Past Surgical History  Procedure Laterality Date  . Back surgery  1985  . Transthoracic echocardiogram  07/29/2010    EF 50=55%, mod inf wall hypokinesis & mild post wall hypokinesis; LA mild-mod dilated; mild mitral annular calcif & mild MR; mild TR & elevated RV systolic pressure; AV mildly sclerotic; mild aortic root dilatation   . Cardiac catheterization  2010    6 stents total  . Coronary angioplasty with stent placement  03/1994    angioplasty & stenting (non-DES) of circumflex/prox ramus intermedius  . Coronary angioplasty  with stent placement  10/1994    large iliac [id number] stent to RCA  . Coronary angioplasty  09/1998    mid-distal RCA balloon dilatation, 4.5 & 5.0 stents   . Cardiac catheterization  01/2000    percutaneous transluminal coronary balloon angioplasty of mid RCA stenotic lesion  . Coronary angioplasty with stent placement  12/2002    4.49mm stents x2 of RCA  . Coronary angioplasty with stent placement  01/2005    cutting balloon arthrectomy of distal RCA & Cypher DES 3.5x13; cutting balloon arthrectomy of mid RCA with Cypher DES 3.5x18  . Cardiac catheterization  06/2006    no stenting; ischemic cardiomyopathy, EF 40-45%  . Coronary angioplasty with stent placement  11/2008    stenting of mid RCA with 4.0x28mm driver, non-DES  . Left heart catheterization with coronary angiogram N/A 02/27/2012    Procedure: LEFT HEART CATHETERIZATION WITH CORONARY ANGIOGRAM;  Surgeon: Lorretta Harp, MD;  Location: [facility] CATH LAB;  Service: Cardiovascular;  Laterality: N/A;   Current Outpatient Prescriptions on File Prior to Visit  Medication Sig Dispense Refill  . aspirin 81 MG tablet Take 81 mg by mouth daily.    . benazepril (LOTENSIN) 5 MG tablet TAKE 1 TABLET BY MOUTH AT BEDTIME (NEEDS OFFICE VISIT) 30 tablet 2  . busPIRone (BUSPAR) 10 MG tablet Take 1 tablet (10 mg total) by mouth daily. 30 tablet 11  . clopidogrel (  PLAVIX) 75 MG tablet Take 1 tablet (75 mg total) by mouth daily. 30 tablet 6  . ezetimibe (ZETIA) 10 MG tablet Take 1 tablet (10 mg total) by mouth at bedtime. 30 tablet 8  . fluticasone (FLONASE) 50 MCG/ACT nasal spray Place 2 sprays into both nostrils daily. 16 g 6  . Insulin Detemir (LEVEMIR FLEXTOUCH) 100 UNIT/ML Pen INJECT 26 UNITS AT BEDTIME -Dx - 250.00 15 pen 6  . meclizine (ANTIVERT) 25 MG tablet Take 1 tablet (25 mg total) by mouth 3 (three) times daily as needed for dizziness. 30 tablet 0  . montelukast (SINGULAIR) 10 MG tablet TAKE 1 TABLET EVERY DAY 30 tablet 11  . nebivolol (BYSTOLIC) 10  MG tablet Take 10 mg by mouth daily.    . nitroGLYCERIN (NITROSTAT) 0.4 MG SL tablet Place 1 tablet (0.4 mg total) under the tongue every 5 (five) minutes x 3 doses as needed for chest pain. 25 tablet 3  . ONE TOUCH ULTRA TEST test strip TEST THREE TIMES A DAY. 100 each 11  . pantoprazole (PROTONIX) 40 MG tablet Take 1 tablet (40 mg total) by mouth daily. 30 tablet 10  . rosuvastatin (CRESTOR) 20 MG tablet Take 1 tablet (20 mg total) by mouth at bedtime. 30 tablet 8  . UNABLE TO FIND CPAP therapy     No current facility-administered medications on file prior to visit.   Allergies  Allergen Reactions  . Fish Allergy Itching  . Fish Oil Itching and Rash   History   Social History  . Marital Status: Married    Spouse Name: N/A    Number of Children: 3  . Years of Education: N/A   Occupational History  . 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 [place]   Social History Main Topics  . Smoking status: Current Some Day Smoker -- 1.00 packs/day for 50 years    Types: Cigarettes  . Smokeless tobacco: Never Used  . Alcohol Use: Yes     Comment: occasionally  . Drug Use: No  . Sexual Activity: Yes   Other Topics Concern  . Not on file   Social History Narrative      Review of Systems  All other systems reviewed and are negative.      Objective:   Physical Exam  Constitutional: He appears well-developed and well-nourished.  Neck: Neck supple. No JVD present. No thyromegaly present.  Cardiovascular: Normal rate, regular rhythm, normal heart sounds and intact distal pulses.   No murmur heard. Pulmonary/Chest: Effort normal and breath sounds normal. No respiratory distress. He has no wheezes. He has no rales. He exhibits no tenderness.  Abdominal: Soft. Bowel sounds are normal. He exhibits no distension and no mass. There is no tenderness. There is no rebound and no guarding.  Musculoskeletal: He exhibits no edema.  Lymphadenopathy:    He has no cervical adenopathy.  Skin:  Rash noted. There is erythema.  Vitals reviewed.         Assessment & Plan:  IDDM (insulin dependent diabetes mellitus) - Plan: COMPLETE METABOLIC PANEL WITH GFR, Lipid panel, Hemoglobin A1c, Microalbumin, urine  Essential hypertension  HLD (hyperlipidemia)  Neoplasm of uncertain behavior of skin  Patient's blood pressure is excellent. I will check his hemoglobin A1c. Goal hemoglobin A1c is less than 7. He reports fasting blood sugars less than 130 and two-hour postprandial sugars less than 140. I will also check a fasting lipid panel. Goal LDL cholesterol is less than 70 given his  history of coronary artery disease. I will also check a urine microalbumin. Diabetic foot exam is performed today and is normal. I did recommend toenail care. I treated the suspicious lesion on his right cheek with cryotherapy using liquid nitrogen for a total of 30 seconds. If the lesion persist I recommend a biopsy of the lesion.

## 2014-06-04 NOTE — Addendum Note (Signed)
Addended by: Shary Decamp B on: 06/04/2014 09:57 AM   Modules accepted: Orders

## 2014-06-05 LAB — MICROALBUMIN, URINE: MICROALB UR: 0.7 mg/dL (ref <2.0)

## 2014-06-08 ENCOUNTER — Encounter: Payer: Self-pay | Admitting: *Deleted

## 2014-06-08 ENCOUNTER — Other Ambulatory Visit: Payer: Self-pay | Admitting: Family Medicine

## 2014-06-08 DIAGNOSIS — E119 Type 2 diabetes mellitus without complications: Secondary | ICD-10-CM

## 2014-06-09 NOTE — Telephone Encounter (Signed)
Insulin pen needles refilled

## 2014-06-10 ENCOUNTER — Telehealth: Payer: Self-pay | Admitting: Family Medicine

## 2014-06-10 DIAGNOSIS — IMO0002 Reserved for concepts with insufficient information to code with codable children: Secondary | ICD-10-CM

## 2014-06-10 DIAGNOSIS — E1165 Type 2 diabetes mellitus with hyperglycemia: Secondary | ICD-10-CM

## 2014-06-10 MED ORDER — GLUCOSE BLOOD VI STRP: 1.0000 | ORAL_STRIP | Freq: Three times a day (TID) | Status: DC

## 2014-06-10 NOTE — Telephone Encounter (Signed)
Test strips refilled x 1 year

## 2014-06-11 ENCOUNTER — Encounter: Payer: Self-pay | Admitting: *Deleted

## 2014-06-17 ENCOUNTER — Telehealth: Payer: Self-pay | Admitting: Cardiovascular Disease

## 2014-06-17 NOTE — Telephone Encounter (Signed)
Closed encounter °

## 2014-06-18 ENCOUNTER — Other Ambulatory Visit: Payer: Self-pay

## 2014-06-18 ENCOUNTER — Other Ambulatory Visit: Payer: Self-pay | Admitting: Cardiovascular Disease

## 2014-06-18 MED ORDER — BUSPIRONE HCL 10 MG PO TABS: 10.0000 mg | ORAL_TABLET | Freq: Every day | ORAL | Status: DC

## 2014-06-18 NOTE — Telephone Encounter (Signed)
Rx sent to pharmacy   

## 2014-06-22 ENCOUNTER — Ambulatory Visit (INDEPENDENT_AMBULATORY_CARE_PROVIDER_SITE_OTHER): Payer: Medicare Other | Admitting: Cardiovascular Disease

## 2014-06-22 ENCOUNTER — Encounter: Payer: Self-pay | Admitting: Cardiovascular Disease

## 2014-06-22 VITALS — BP 132/84 | HR 55 | Ht 72.0 in | Wt 227.2 lb

## 2014-06-22 DIAGNOSIS — E785 Hyperlipidemia, unspecified: Secondary | ICD-10-CM | POA: Diagnosis not present

## 2014-06-22 DIAGNOSIS — Z9989 Dependence on other enabling machines and devices: Secondary | ICD-10-CM

## 2014-06-22 DIAGNOSIS — K219 Gastro-esophageal reflux disease without esophagitis: Secondary | ICD-10-CM | POA: Diagnosis not present

## 2014-06-22 DIAGNOSIS — I2510 Atherosclerotic heart disease of native coronary artery without angina pectoris: Secondary | ICD-10-CM

## 2014-06-22 DIAGNOSIS — I10 Essential (primary) hypertension: Secondary | ICD-10-CM | POA: Diagnosis not present

## 2014-06-22 DIAGNOSIS — G4733 Obstructive sleep apnea (adult) (pediatric): Secondary | ICD-10-CM

## 2014-06-22 NOTE — Progress Notes (Signed)
Patient ID: John [name], male   DOB: 1951/02/06, 64 y.o.   MRN: [id number]     HPI:  John [name] is a 64 y.o. male who presents for a one year cardiology followup evaluation.  He is a former patient of Dr. Rollene Fare.  John [name] has known coronary artery disease and underwent multiple percutaneous cardiac interventions with stenting to circumflex and RCA. His last cardiac catheterization was done by [name] in September 2013 which showed his RCA stented the pain but he did have 40% narrowing in the very distal aspect. He has a history of hypertension, as well as long-standing tobacco use with mild polycythemia. I had seen him in a sleep clinic after he obtained a new CPAP machine last year. He did have severe sleep apnea with an AHI on his initial diagnostic study of 44 per hour overall and 72.6 per hour with REM sleep. When I see him in September, subsequent download showed excellent benefit from CPAP with an AHI of 3.1.  From a cardiac perspective, he has been without anginal symptoms. His initial stent to his RCA was in 1996 was a Cypher PS [id number] sent. In 1999 he underwent non-DES stenting to circumflex after he presented with a posterior MI. He had additional 4-5 stents placed in his right coronary artery.  He has a history of prior back surgery and has had a left foot drop.  He has significant obstructive sleep apnea and continues to utilize CPAP therapy.  When I saw him last year despite CPAP therapy.  He noted fatigue and residual daytime sleepiness.  I did suggest the possibility of new visual or Provigil if necessary.    Over the past year, he has felt improved.  His workload has reduced from greater than 60 hours per week for the [employer] to being partially retired and 20 hours per week.  He feels more rested.  He has not had a download checked on his CPAP unit for some time.  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  He recently saw [name].  I reviewed his recent blood  work.  Of note, his lipids are very aggressively treated with a total cholesterol now at 74, LDL cluster 28, triglycerides 52, HDL 36.  He has been on both Zetia 10 mg and Crestor 20 mg.  He has continued to take Bystolic 10 mg in the hospital 5 mg for blood pressure.  He denies bleeding on dual antiplatelet therapy.  He is diabetic on insulin.  Past Medical History  Diagnosis Date  . Coronary artery disease   . Myocardial infarction     posterior MI  . Shortness of breath   . Sleep apnea     on CPAP; 04/28/2007 split-night - AHI during total sleep 44.43/hr and REM 72.56/hr  . Hypertension   . Hyperlipidemia   . Diabetes mellitus   . GERD (gastroesophageal reflux disease)   . Left foot drop     r/t past disk srugery - uses Kevlar brace  . History of nuclear stress test 04/04/2011    lexiscan; mod-large in size fixed inferolateral defect (scar); non-diagnostic for ischemia; low risk scan     Past Surgical History  Procedure Laterality Date  . Back surgery  1985  . Transthoracic echocardiogram  07/29/2010    EF 50=55%, mod inf wall hypokinesis & mild post wall hypokinesis; LA mild-mod dilated; mild mitral annular calcif & mild MR; mild TR & elevated RV systolic pressure; AV mildly sclerotic; mild  aortic root dilatation   . Cardiac catheterization  2010    6 stents total  . Coronary angioplasty with stent placement  03/1994    angioplasty & stenting (non-DES) of circumflex/prox ramus intermedius  . Coronary angioplasty with stent placement  10/1994    large iliac [id number] stent to RCA  . Coronary angioplasty  09/1998    mid-distal RCA balloon dilatation, 4.5 & 5.0 stents   . Cardiac catheterization  01/2000    percutaneous transluminal coronary balloon angioplasty of mid RCA stenotic lesion  . Coronary angioplasty with stent placement  12/2002    4.60mm stents x2 of RCA  . Coronary angioplasty with stent placement  01/2005    cutting balloon arthrectomy of distal RCA & Cypher DES 3.5x13;  cutting balloon arthrectomy of mid RCA with Cypher DES 3.5x18  . Cardiac catheterization  06/2006    no stenting; ischemic cardiomyopathy, EF 40-45%  . Coronary angioplasty with stent placement  11/2008    stenting of mid RCA with 4.0x22mm driver, non-DES  . Left heart catheterization with coronary angiogram N/A 02/27/2012    Procedure: LEFT HEART CATHETERIZATION WITH CORONARY ANGIOGRAM;  Surgeon: Lorretta Harp, MD;  Location: [facility] CATH LAB;  Service: Cardiovascular;  Laterality: N/A;    Allergies  Allergen Reactions  . Fish Allergy Itching  . Fish Oil Itching and Rash    Current Outpatient Prescriptions  Medication Sig Dispense Refill  . aspirin 81 MG tablet Take 81 mg by mouth daily.    . benazepril (LOTENSIN) 5 MG tablet TAKE 1 TABLET BY MOUTH AT BEDTIME (NEEDS OFFICE VISIT) 30 tablet 2  . busPIRone (BUSPAR) 10 MG tablet Take 1 tablet (10 mg total) by mouth daily. 30 tablet 0  . BYSTOLIC 10 MG tablet TAKE 1 TABLET BY MOUTH EVERY DAY 30 tablet 10  . clopidogrel (PLAVIX) 75 MG tablet TAKE 1 TABLET BY MOUTH EVERY DAY 30 tablet 5  . CRESTOR 20 MG tablet TAKE 1 TABLET (20 MG TOTAL) BY MOUTH AT BEDTIME. 30 tablet 7  . glucose blood (ONE TOUCH ULTRA TEST) test strip 1 each by Other route 3 (three) times daily. Use as instructed 100 each 11  . Insulin Detemir (LEVEMIR FLEXTOUCH) 100 UNIT/ML Pen INJECT 26 UNITS AT BEDTIME -Dx - 250.00 15 pen 6  . Insulin Pen Needle (NOVOFINE) 32G X 6 MM MISC 1 each by Other route daily. 100 each 3  . montelukast (SINGULAIR) 10 MG tablet TAKE 1 TABLET EVERY DAY 30 tablet 11  . nebivolol (BYSTOLIC) 10 MG tablet Take 10 mg by mouth daily.    . nitroGLYCERIN (NITROSTAT) 0.4 MG SL tablet Place 1 tablet (0.4 mg total) under the tongue every 5 (five) minutes x 3 doses as needed for chest pain. 25 tablet 3  . pantoprazole (PROTONIX) 40 MG tablet Take 1 tablet (40 mg total) by mouth daily. 30 tablet 10  . UNABLE TO FIND CPAP therapy    . ZETIA 10 MG tablet TAKE 1  TABLET (10 MG TOTAL) BY MOUTH AT BEDTIME. 30 tablet 7   No current facility-administered medications for this visit.    History   Social History  . Marital Status: Married    Spouse Name: N/A    Number of Children: 3  . Years of Education: N/A   Occupational History  . 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 [place]   Social History Main Topics  . Smoking status: Current Some Day Smoker -- 1.00 packs/day for 50  years    Types: Cigarettes  . Smokeless tobacco: Never Used  . Alcohol Use: Yes     Comment: occasionally  . Drug Use: No  . Sexual Activity: Yes   Other Topics Concern  . Not on file   Social History Narrative   Social history is known that he works as a Freight forwarder for [employer]. There is a long-standing tobacco history. He rarely drinks alcohol.  Family History  Problem Relation Age of Onset  . Heart attack Father    ROS General: Negative; No fevers, chills, or night sweats;  HEENT: Negative; No changes in vision or hearing, sinus congestion, difficulty swallowing Pulmonary: Negative; No cough, wheezing, shortness of breath, hemoptysis Cardiovascular: Negative; No chest pain, presyncope, syncope, palpitations GI: Negative; No nausea, vomiting, diarrhea, or abdominal pain GU: Negative; No dysuria, hematuria, or difficulty voiding Musculoskeletal: Negative; no myalgias, joint pain, or weakness Hematologic/Oncology: Negative; no easy bruising, bleeding Endocrine: Negative; no heat/cold intolerance; no diabetes Neuro: Negative; no changes in balance, headaches Skin: Negative; No rashes or skin lesions Psychiatric: Negative; No behavioral problems, depression Sleep: Positive for obstructive sleep apnea.  Mild residual daytime sleepiness; no bruxism, restless legs, hypnogognic hallucinations, no cataplexy Other comprehensive 14 point system review is negative.   PE BP 132/84 mmHg  Pulse 55  Ht 6' (1.829 m)  Wt 227 lb 3.2 oz (103.057 kg)  BMI 30.81 kg/m2    General: Alert, oriented, no distress.  Skin: normal turgor, no rashes HEENT: Normocephalic, atraumatic. Pupils round and reactive; sclera anicteric;no lid lag. Extraocular muscles intact. Nose without nasal septal hypertrophy Mouth/Parynx benign; Mallinpatti scale 3 Neck: No JVD, no carotid bruits; normal carotid upstroke Lungs: clear to ausculatation and percussion; no wheezing or rales Chest wall: no tenderness to palpitation Heart: RRR, s1 s2 normal 1/6 systolic murmur. No rubs thrills or heaves Abdomen: soft, nontender; no hepatosplenomehaly, BS+; abdominal aorta nontender and not dilated by palpation. Back: no CVA tenderness Pulses 2+ Extremities: no clubbing cyanosis or edema, Homan's sign negative  Neurologic: grossly nonfocal; cranial nerves grossly normal. Psychologic: normal affect and mood.  ECG (independently read by me): Sinus bradycardia 55 bpm.  Inferior Q waves compatible with old inferior MI.  No significant ST segment changes.  January 2015 ECG (independently read by me): Normal sinus rhythm at 75 beats per minute. Old inferior infarction with inferior Q waves. No other ST-T changes  LABS:  BMET  BMP Latest Ref Rng 06/04/2014 12/25/2013 06/30/2013  Glucose 70 - 99 mg/dL 113(H) 145(H) 155(H)  BUN 6 - 23 mg/dL 13 15 12   Creatinine 0.50 - 1.35 mg/dL 1.01 1.05 0.91  Sodium 135 - 145 mEq/L 143 143 141  Potassium 3.5 - 5.3 mEq/L 4.6 4.7 4.3  Chloride 96 - 112 mEq/L 107 106 105  CO2 19 - 32 mEq/L 25 28 30   Calcium 8.4 - 10.5 mg/dL 9.0 9.4 9.3     Hepatic Function Panel     Component Value Date/Time   PROT 5.8* 06/04/2014 0832   ALBUMIN 3.7 06/04/2014 0832   AST 21 06/04/2014 0832   ALT 20 06/04/2014 0832   ALKPHOS 64 06/04/2014 0832   BILITOT 0.6 06/04/2014 0832   BILIDIR 0.1 01/09/2007 1511   IBILI 0.5 01/09/2007 1511     CBC  CBC Latest Ref Rng 12/25/2013 06/30/2013 08/19/2012  WBC 4.0 - 10.5 K/uL 7.9 12.0(H) 8.4  Hemoglobin 13.0 - 17.0 g/dL 16.9 17.4(H)  16.3  Hematocrit 39.0 - 52.0 % 49.7 50.0 47.4  Platelets 150 -  400 K/uL 172 186 161     BNP No results found for: PROBNP  Lipid Panel     Component Value Date/Time   CHOL 74 06/04/2014 0832   TRIG 52 06/04/2014 0832   HDL 36* 06/04/2014 0832   CHOLHDL 2.1 06/04/2014 0832   VLDL 10 06/04/2014 0832   LDLCALC 28 06/04/2014 0832     RADIOLOGY: No results found.    ASSESSMENT AND PLAN:  Mr. Daoust  Is a 64 year-old Caucasian male who has established coronary artery disease and has undergone prior extensive stenting to his right coronary artery and also stenting to his circumflex vessel. An echo Doppler study in 2013 showed ejection fraction of 45-50%. He did have severe inferior, inferolateral and inferoseptal hypokinesis in the basal inferolateral wall segments are thickened consistent with scar. He does have mild MR mild TR. His last cardiac catheterization was done but by [name] in September 2013.  He has a history of hypertension.  His blood pressure today is stable at 120/70 when repeated by me on his current regimen of Bystolic 10 mg and benazepril 5 mg.  He has hyperlipidemia and has been treated aggressively with both combination therapy consisting of Crestor 20 m and Zetia 10 mg.  I reviewed his recent laboratory.  I recommended he discontinue Zetia since I do not believe this is necessary at this time.  Target LDL is less than 70 in this patient was significant CAD, status post multiple interventions.  He is on dual antiplatelet therapy and is without bleeding.   He has a history of asthma, currently on Singulair 10 mg.  He has GERD and is on pantoprazole without recent increased symptomatology.  He does have obstructive sleep apnea.  He has noticed some improvement from his previous fatigue but he is also sleeping better now that his workload has significantly reduced from greater than 60 hours per week to approximate 20 hours per week.  I will [facility] to make certain he is optimally treated with reference to his CPAP pressures.  He has lost 4 pounds since his last office visit.  Additional weight reduction and exercise was recommended.  As long as he remains stable, I will see him in one year for cardiology reevaluation.    Troy Sine, MD, [facility]  06/22/2014 10:56 AM

## 2014-06-22 NOTE — Patient Instructions (Signed)
Your physician recommends that you schedule a follow-up appointment in: [place] has recommended you make the following change in your medication: STOP Zetia, continue with all other Medications

## 2014-06-23 ENCOUNTER — Telehealth: Payer: Self-pay | Admitting: *Deleted

## 2014-06-23 NOTE — Telephone Encounter (Signed)
Faxed order to [place] to obtain a download on patient's CPAP machine. Order faxed to [phone].

## 2014-06-26 ENCOUNTER — Encounter: Payer: Self-pay | Admitting: Cardiovascular Disease

## 2014-07-14 ENCOUNTER — Other Ambulatory Visit: Payer: Self-pay | Admitting: Cardiovascular Disease

## 2014-07-14 ENCOUNTER — Other Ambulatory Visit: Payer: Self-pay | Admitting: *Deleted

## 2014-07-14 MED ORDER — PANTOPRAZOLE SODIUM 40 MG PO TBEC: 40.0000 mg | DELAYED_RELEASE_TABLET | Freq: Every day | ORAL | Status: DC

## 2014-07-14 NOTE — Telephone Encounter (Signed)
Rx(s) sent to pharmacy electronically.  

## 2014-07-21 ENCOUNTER — Telehealth: Payer: Self-pay | Admitting: Cardiovascular Disease

## 2014-07-24 MED ORDER — BUSPIRONE HCL 10 MG PO TABS: 10.0000 mg | ORAL_TABLET | Freq: Every day | ORAL | Status: DC

## 2014-07-24 NOTE — Telephone Encounter (Signed)
°  1. Which medications need to be refilled? Buspirone  2. Which pharmacy is medication to be sent to?[facility]   3. Do they need a 30 day or 90 day supply? 30  4. Would they like a call back once the medication has been sent to the pharmacy? no

## 2014-07-24 NOTE — Telephone Encounter (Signed)
Rx refilled.

## 2014-07-31 ENCOUNTER — Telehealth: Payer: Self-pay | Admitting: *Deleted

## 2014-07-31 NOTE — Telephone Encounter (Signed)
Returned CPAP supply order to [facility].

## 2014-08-13 ENCOUNTER — Other Ambulatory Visit: Payer: Self-pay | Admitting: Family Medicine

## 2014-08-13 NOTE — Telephone Encounter (Signed)
Refill appropriate and filled per protocol. 

## 2014-09-09 ENCOUNTER — Ambulatory Visit: Payer: 59 | Admitting: Cardiovascular Disease

## 2014-09-14 ENCOUNTER — Encounter: Payer: Self-pay | Admitting: Cardiovascular Disease

## 2014-09-16 ENCOUNTER — Telehealth: Payer: Self-pay | Admitting: *Deleted

## 2014-09-16 NOTE — Telephone Encounter (Signed)
-----   Message from Troy Sine, MD sent at 09/14/2014  6:26 PM EDT ----- Compliant for usage days but not usage > 4 hrs per night. AHI is good, but increase leak, may need new mask or cushion adjustment. Contact MDE company

## 2014-09-16 NOTE — Telephone Encounter (Signed)
Notified patient of compliance report. I will notify [facility] to contact him to check out reason for mask leak.

## 2014-09-30 ENCOUNTER — Ambulatory Visit (INDEPENDENT_AMBULATORY_CARE_PROVIDER_SITE_OTHER): Payer: Medicare Other | Admitting: Family Medicine

## 2014-09-30 ENCOUNTER — Encounter: Payer: Self-pay | Admitting: Family Medicine

## 2014-09-30 ENCOUNTER — Ambulatory Visit: Payer: Medicare Other | Admitting: Family Medicine

## 2014-09-30 VITALS — BP 130/80 | HR 62 | Temp 98.2°F | Resp 16 | Ht 72.0 in | Wt 232.0 lb

## 2014-09-30 DIAGNOSIS — H9311 Tinnitus, right ear: Secondary | ICD-10-CM

## 2014-09-30 DIAGNOSIS — I2510 Atherosclerotic heart disease of native coronary artery without angina pectoris: Secondary | ICD-10-CM | POA: Diagnosis not present

## 2014-09-30 DIAGNOSIS — L570 Actinic keratosis: Secondary | ICD-10-CM | POA: Diagnosis not present

## 2014-09-30 NOTE — Progress Notes (Signed)
Subjective:    Patient ID: John [name], male    DOB: [birth year], 64 y.o.   MRN: [id number]  HPI Patient has numerous lesions on both forearms. He is particular concern about a large erythematous white hard scaly papule on the superior aspect of his right elbow. He also has numerous erythematous scaly white papules on the dorsum of his left forearm. However there are 3 of concern. Each of these 4 lesions are approximately 4-5 mm in size. There are erythematous white hard scaly papules.  Each appears to be an actinic keratosis versus squamous cell carcinoma in situ.  He also reports increased tinnitus in his right ear. On examination today he has a cerumen impaction that is approximate 70% blocking his ear canal. This was removed with irrigation easily without any improvement in his hearing. On hearing screen the patient is unable to hear any frequency between 500-4,000 Hz up to 40 dB.   Past Medical History  Diagnosis Date  . Coronary artery disease   . Myocardial infarction     posterior MI  . Shortness of breath   . Sleep apnea     on CPAP; 04/28/2007 split-night - AHI during total sleep 44.43/hr and REM 72.56/hr  . Hypertension   . Hyperlipidemia   . Diabetes mellitus   . GERD (gastroesophageal reflux disease)   . Left foot drop     r/t past disk srugery - uses Kevlar brace  . History of nuclear stress test 04/04/2011    lexiscan; mod-large in size fixed inferolateral defect (scar); non-diagnostic for ischemia; low risk scan    Past Surgical History  Procedure Laterality Date  . Back surgery  1985  . Transthoracic echocardiogram  07/29/2010    EF 50=55%, mod inf wall hypokinesis & mild post wall hypokinesis; LA mild-mod dilated; mild mitral annular calcif & mild MR; mild TR & elevated RV systolic pressure; AV mildly sclerotic; mild aortic root dilatation   . Cardiac catheterization  2010    6 stents total  . Coronary angioplasty with stent placement  03/1994    angioplasty &  stenting (non-DES) of circumflex/prox ramus intermedius  . Coronary angioplasty with stent placement  10/1994    large iliac [id number] stent to RCA  . Coronary angioplasty  09/1998    mid-distal RCA balloon dilatation, 4.5 & 5.0 stents   . Cardiac catheterization  01/2000    percutaneous transluminal coronary balloon angioplasty of mid RCA stenotic lesion  . Coronary angioplasty with stent placement  12/2002    4.60mm stents x2 of RCA  . Coronary angioplasty with stent placement  01/2005    cutting balloon arthrectomy of distal RCA & Cypher DES 3.5x13; cutting balloon arthrectomy of mid RCA with Cypher DES 3.5x18  . Cardiac catheterization  06/2006    no stenting; ischemic cardiomyopathy, EF 40-45%  . Coronary angioplasty with stent placement  11/2008    stenting of mid RCA with 4.0x30mm driver, non-DES  . Left heart catheterization with coronary angiogram N/A 02/27/2012    Procedure: LEFT HEART CATHETERIZATION WITH CORONARY ANGIOGRAM;  Surgeon: Lorretta Harp, MD;  Location: [facility] CATH LAB;  Service: Cardiovascular;  Laterality: N/A;   Current Outpatient Prescriptions on File Prior to Visit  Medication Sig Dispense Refill  . aspirin 81 MG tablet Take 81 mg by mouth daily.    . benazepril (LOTENSIN) 5 MG tablet Take 1 tablet (5 mg total) by mouth at bedtime. 30 tablet 11  . busPIRone (BUSPAR) 10 MG tablet Take  1 tablet (10 mg total) by mouth daily. 30 tablet 0  . BYSTOLIC 10 MG tablet TAKE 1 TABLET BY MOUTH EVERY DAY 30 tablet 10  . clopidogrel (PLAVIX) 75 MG tablet TAKE 1 TABLET BY MOUTH EVERY DAY 30 tablet 5  . CRESTOR 20 MG tablet TAKE 1 TABLET (20 MG TOTAL) BY MOUTH AT BEDTIME. 30 tablet 7  . glucose blood (ONE TOUCH ULTRA TEST) test strip 1 each by Other route 3 (three) times daily. Use as instructed 100 each 11  . Insulin Detemir (LEVEMIR FLEXTOUCH) 100 UNIT/ML Pen INJECT 26 UNITS AT BEDTIME -Dx - 250.00 15 pen 6  . Insulin Pen Needle (NOVOFINE) 32G X 6 MM MISC 1 each by Other route daily. 100  each 3  . montelukast (SINGULAIR) 10 MG tablet TAKE 1 TABLET BY MOUTH EVERY DAY 30 tablet 11  . nebivolol (BYSTOLIC) 10 MG tablet Take 10 mg by mouth daily.    . nitroGLYCERIN (NITROSTAT) 0.4 MG SL tablet Place 1 tablet (0.4 mg total) under the tongue every 5 (five) minutes x 3 doses as needed for chest pain. 25 tablet 3  . pantoprazole (PROTONIX) 40 MG tablet Take 1 tablet (40 mg total) by mouth daily. 30 tablet 10  . UNABLE TO FIND CPAP therapy     No current facility-administered medications on file prior to visit.   Allergies  Allergen Reactions  . Fish Allergy Itching  . Fish Oil Itching and Rash   History   Social History  . Marital Status: Married    Spouse Name: N/A  . Number of Children: 3  . Years of Education: N/A   Occupational History  . 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 [place]   Social History Main Topics  . Smoking status: Current Some Day Smoker -- 1.00 packs/day for 50 years    Types: Cigarettes  . Smokeless tobacco: Never Used  . Alcohol Use: Yes     Comment: occasionally  . Drug Use: No  . Sexual Activity: Yes   Other Topics Concern  . Not on file   Social History Narrative      Review of Systems  All other systems reviewed and are negative.      Objective:   Physical Exam  Constitutional: He appears well-developed and well-nourished.  Cardiovascular: Normal rate, regular rhythm and normal heart sounds.   Pulmonary/Chest: Effort normal and breath sounds normal.  Skin: There is erythema.  Vitals reviewed.  see the description of the lesions on his forearms in the history of present illness. Patient has a 70% blockage in his right ear canal due to wax which was easily removed with irrigation and lavage. Patient has markedly diminished hearing in his right ear.        Assessment & Plan:  Actinic keratoses  Tinnitus aurium, right  Each of the 4 lesions described in the history of present illness was treated with cryotherapy using  liquid nitrogen for a total of 30 seconds each. If any of the lesions persist I would recommend a shave biopsy. We removed the cerumen impaction with lavage without difficulty. The tinnitus I believe is due to hearing loss. I have recommended the patient follow-up with his audiologist as soon as possible. He called today and got an appointment.

## 2014-10-01 DIAGNOSIS — H9121 Sudden idiopathic hearing loss, right ear: Secondary | ICD-10-CM | POA: Diagnosis not present

## 2014-10-09 ENCOUNTER — Telehealth: Payer: Self-pay | Admitting: *Deleted

## 2014-10-09 NOTE — Telephone Encounter (Signed)
[facility]ed [facility] to [facility].

## 2014-10-16 DIAGNOSIS — H9121 Sudden idiopathic hearing loss, right ear: Secondary | ICD-10-CM | POA: Diagnosis not present

## 2014-10-16 DIAGNOSIS — H903 Sensorineural hearing loss, bilateral: Secondary | ICD-10-CM | POA: Diagnosis not present

## 2014-10-20 ENCOUNTER — Other Ambulatory Visit: Payer: Self-pay | Admitting: Otolaryngology

## 2014-10-20 DIAGNOSIS — H9121 Sudden idiopathic hearing loss, right ear: Secondary | ICD-10-CM

## 2014-11-02 ENCOUNTER — Ambulatory Visit
Admission: RE | Admit: 2014-11-02 | Discharge: 2014-11-02 | Disposition: A | Discharge status: Home / Self Care | Payer: Medicare Other | Source: Ambulatory Visit | Attending: Otolaryngology | Admitting: Otolaryngology

## 2014-11-02 DIAGNOSIS — H9121 Sudden idiopathic hearing loss, right ear: Secondary | ICD-10-CM | POA: Diagnosis not present

## 2014-11-02 DIAGNOSIS — R42 Dizziness and giddiness: Secondary | ICD-10-CM | POA: Diagnosis not present

## 2014-11-02 MED ORDER — GADOBENATE DIMEGLUMINE 529 MG/ML IV SOLN
20.0000 mL | Freq: Once | INTRAVENOUS | Status: AC | PRN
  Administered 2014-11-02: 20 mL via INTRAVENOUS

## 2014-12-10 ENCOUNTER — Other Ambulatory Visit: Payer: Self-pay | Admitting: Cardiovascular Disease

## 2014-12-10 NOTE — Telephone Encounter (Signed)
REFILL 

## 2014-12-16 DIAGNOSIS — D333 Benign neoplasm of cranial nerves: Secondary | ICD-10-CM | POA: Diagnosis not present

## 2014-12-16 DIAGNOSIS — H905 Unspecified sensorineural hearing loss: Secondary | ICD-10-CM | POA: Diagnosis not present

## 2014-12-29 ENCOUNTER — Telehealth: Payer: Self-pay | Admitting: Family Medicine

## 2014-12-29 DIAGNOSIS — M2040 Other hammer toe(s) (acquired), unspecified foot: Secondary | ICD-10-CM

## 2014-12-29 DIAGNOSIS — M2010 Hallux valgus (acquired), unspecified foot: Secondary | ICD-10-CM

## 2014-12-29 NOTE — Telephone Encounter (Signed)
ok 

## 2014-12-29 NOTE — Telephone Encounter (Signed)
OK to refer.

## 2014-12-29 NOTE — Telephone Encounter (Signed)
Patient would like referral to a podiatrist if possible  [phone] if any questions

## 2014-12-30 NOTE — Telephone Encounter (Signed)
Referral placed for bunion and hammer toe

## 2014-12-30 NOTE — Telephone Encounter (Signed)
[facility] - need to know why he needs referral.

## 2015-01-12 ENCOUNTER — Encounter: Payer: Self-pay | Admitting: Podiatry

## 2015-01-12 ENCOUNTER — Ambulatory Visit (INDEPENDENT_AMBULATORY_CARE_PROVIDER_SITE_OTHER): Payer: Medicare Other | Admitting: Podiatry

## 2015-01-12 ENCOUNTER — Telehealth: Payer: Self-pay | Admitting: *Deleted

## 2015-01-12 ENCOUNTER — Ambulatory Visit (INDEPENDENT_AMBULATORY_CARE_PROVIDER_SITE_OTHER): Payer: Medicare Other

## 2015-01-12 VITALS — BP 132/75 | HR 60 | Resp 16 | Ht 72.0 in | Wt 220.0 lb

## 2015-01-12 DIAGNOSIS — L603 Nail dystrophy: Secondary | ICD-10-CM

## 2015-01-12 DIAGNOSIS — M2012 Hallux valgus (acquired), left foot: Secondary | ICD-10-CM

## 2015-01-12 DIAGNOSIS — B351 Tinea unguium: Secondary | ICD-10-CM | POA: Diagnosis not present

## 2015-01-12 DIAGNOSIS — I2510 Atherosclerotic heart disease of native coronary artery without angina pectoris: Secondary | ICD-10-CM | POA: Diagnosis not present

## 2015-01-12 DIAGNOSIS — M2042 Other hammer toe(s) (acquired), left foot: Secondary | ICD-10-CM | POA: Diagnosis not present

## 2015-01-12 NOTE — Telephone Encounter (Signed)
Toenail fragments sent to [facility] for definitive diagnosis of fungal element.

## 2015-01-12 NOTE — Progress Notes (Signed)
   Subjective:    Patient ID: John [name], male    DOB: February 19, 1951, 64 y.o.   MRN: [id number]  HPI Comments: "I have a bad foot"  Patient c/o tender 1st MPJ and 2nd toe left for several years. The area of the 1st MPJ is red and swells some. The 2nd toe has a callus medial side that rubs against the big toe. He has drop foot left side too. He has had a couple of back surgeries that caused this. He wears a drop foot brace. Would like to discuss surgical options.  Foot Pain      Review of Systems  HENT: Positive for hearing loss and tinnitus.   Musculoskeletal: Positive for gait problem.  Hematological: Bruises/bleeds easily.  All other systems reviewed and are negative.      Objective:   Physical Exam [facility] with a chief complaint of thick mycotic nails. He is also concerned of an area of irritation to the medial aspect second digit left foot. He states that his wife is more concerned about this than he is. He continues to dress the second toe daily utilizing toilet tissue and tape. He states that he just pills the scab off when it develops.  I have reviewed his past medical history medications allergies surgery social history and review of systems. Pulses are strongly palpable bilateral. Neurologic sensorium is intact per Semmes-Weinstein monofilament. Deep tendon reflexes are brisk and equal bilateral. Muscle strength +5 over 5 dorsiflexion plantar flexors and inverters everters onto the musculature is intact with exception of the dorsiflexors of the left foot due to back surgery and secondary injury resulting in drop foot left. Moderate to severe hallux abductovalgus deformity rigid in nature with a cocked up second digit also rigid in nature the PIPJ. This has resulted in irritation and superficial blood collection beneath the epidermis overlying the DIPJ medially second digit. I see no signs of infection. His hallux nails and second digital nails are  thick yellow dystrophic there is no subungual debris and I cannot rule out onychomycosis at this point. Radiographic evaluation of the bilateral foot demonstrates severe hallux abductovalgus deformity osteoarthritic changes of the first metatarsophalangeal joints bilaterally and hammertoe deformities. Reactive hyperkeratotic lesion DIPJ medial aspect second digit left was debrided does not demonstrate any type of infection.      Assessment & Plan:  Assessment: Diabetes mellitus without complications. Dropfoot left. Hallux abductovalgus deformity with hammertoe deformity and irritation of the second toe. Nail dystrophy with possible onychomycosis bilateral hallux and second toes bilateral.  Plan: Discussed etiology pathology conservative versus surgical therapies. Took samples of the toenails today and sent for pathologic evaluation. We'll notify him once these return.

## 2015-02-05 ENCOUNTER — Telehealth: Payer: Self-pay | Admitting: *Deleted

## 2015-02-05 NOTE — Telephone Encounter (Signed)
Dr. Milinda Pointer reviewed [place] fungal culture results +fungus and yeast, pt needs an appt to discuss treatment.  Left message informing pt of the orders.

## 2015-02-06 ENCOUNTER — Other Ambulatory Visit: Payer: Self-pay | Admitting: Cardiovascular Disease

## 2015-02-08 NOTE — Telephone Encounter (Signed)
Rx(s) sent to pharmacy electronically.  

## 2015-02-11 ENCOUNTER — Ambulatory Visit (INDEPENDENT_AMBULATORY_CARE_PROVIDER_SITE_OTHER): Payer: Medicare Other | Admitting: Podiatry

## 2015-02-11 ENCOUNTER — Encounter: Payer: Self-pay | Admitting: Podiatry

## 2015-02-11 VITALS — BP 138/72 | HR 62 | Resp 12

## 2015-02-11 DIAGNOSIS — I2510 Atherosclerotic heart disease of native coronary artery without angina pectoris: Secondary | ICD-10-CM | POA: Diagnosis not present

## 2015-02-11 DIAGNOSIS — Z79899 Other long term (current) drug therapy: Secondary | ICD-10-CM | POA: Diagnosis not present

## 2015-02-11 DIAGNOSIS — L603 Nail dystrophy: Secondary | ICD-10-CM

## 2015-02-11 MED ORDER — TERBINAFINE HCL 250 MG PO TABS
250.0000 mg | ORAL_TABLET | Freq: Every day | ORAL | Status: DC
Start: 2015-02-11 — End: 2015-02-11

## 2015-02-12 NOTE — Progress Notes (Signed)
John [name] presents today for follow-up of his fungal culture. He states that they have not changed and seem to be getting worse.   Objective: Vital signs are stable alert and oriented 3 no changes in physical exam nail plates are thick yellow dystrophic and clinically mycotic.   Assessment: onychomycosis.  Plan: after evaluating his past medical history medications and allergies we came to the conclusion that  That he would be better off with laser therapy. We will laser 3 toes of his left foot 1 toe to his right foot and we will start this in the next few weeks.

## 2015-03-09 ENCOUNTER — Encounter: Payer: Self-pay | Admitting: Podiatry

## 2015-03-19 ENCOUNTER — Encounter: Payer: Self-pay | Admitting: Family Medicine

## 2015-03-19 ENCOUNTER — Other Ambulatory Visit: Payer: Self-pay | Admitting: Family Medicine

## 2015-03-19 DIAGNOSIS — E1165 Type 2 diabetes mellitus with hyperglycemia: Secondary | ICD-10-CM

## 2015-03-19 DIAGNOSIS — IMO0002 Reserved for concepts with insufficient information to code with codable children: Secondary | ICD-10-CM

## 2015-03-19 NOTE — Telephone Encounter (Signed)
Medication refill for one time only.  Patient needs to be seen.  Letter sent for patient to call and schedule 

## 2015-03-25 ENCOUNTER — Ambulatory Visit: Payer: Medicare Other | Admitting: Podiatry

## 2015-03-25 DIAGNOSIS — B351 Tinea unguium: Secondary | ICD-10-CM

## 2015-03-25 NOTE — Progress Notes (Signed)
He presents today for laser therapy. This was performed today without any complications noted iatrogenic lesions and no pain.  Assessment onychomycosis toes numbering 5.  Plan follow-up for second laser therapy in 3 months.

## 2015-04-08 ENCOUNTER — Ambulatory Visit (INDEPENDENT_AMBULATORY_CARE_PROVIDER_SITE_OTHER): Payer: Medicare Other | Admitting: Family Medicine

## 2015-04-08 ENCOUNTER — Encounter: Payer: Self-pay | Admitting: Family Medicine

## 2015-04-08 ENCOUNTER — Other Ambulatory Visit: Payer: Self-pay | Admitting: *Deleted

## 2015-04-08 ENCOUNTER — Telehealth: Payer: Self-pay | Admitting: Cardiovascular Disease

## 2015-04-08 ENCOUNTER — Ambulatory Visit (HOSPITAL_COMMUNITY)
Admission: RE | Admit: 2015-04-08 | Discharge: 2015-04-08 | Disposition: A | Discharge status: Home / Self Care | Payer: Medicare Other | Source: Ambulatory Visit | Attending: Family Medicine | Admitting: Family Medicine

## 2015-04-08 VITALS — BP 138/80 | HR 60 | Temp 98.3°F | Resp 16 | Ht 72.0 in | Wt 225.0 lb

## 2015-04-08 DIAGNOSIS — R911 Solitary pulmonary nodule: Secondary | ICD-10-CM | POA: Insufficient documentation

## 2015-04-08 DIAGNOSIS — R0789 Other chest pain: Secondary | ICD-10-CM

## 2015-04-08 DIAGNOSIS — I509 Heart failure, unspecified: Secondary | ICD-10-CM | POA: Insufficient documentation

## 2015-04-08 DIAGNOSIS — Z794 Long term (current) use of insulin: Secondary | ICD-10-CM | POA: Diagnosis not present

## 2015-04-08 DIAGNOSIS — R0602 Shortness of breath: Secondary | ICD-10-CM | POA: Diagnosis not present

## 2015-04-08 DIAGNOSIS — E119 Type 2 diabetes mellitus without complications: Secondary | ICD-10-CM

## 2015-04-08 DIAGNOSIS — Z23 Encounter for immunization: Secondary | ICD-10-CM | POA: Diagnosis not present

## 2015-04-08 DIAGNOSIS — I2510 Atherosclerotic heart disease of native coronary artery without angina pectoris: Secondary | ICD-10-CM

## 2015-04-08 LAB — CBC WITH DIFFERENTIAL/PLATELET
Basophils Absolute: 0 10*3/uL (ref 0.0–0.1)
Basophils Relative: 0 % (ref 0–1)
EOS PCT: 3 % (ref 0–5)
Eosinophils Absolute: 0.2 10*3/uL (ref 0.0–0.7)
HEMATOCRIT: 48.4 % (ref 39.0–52.0)
Hemoglobin: 16.3 g/dL (ref 13.0–17.0)
Lymphocytes Relative: 22 % (ref 12–46)
Lymphs Abs: 1.4 10*3/uL (ref 0.7–4.0)
MCH: 30.8 pg (ref 26.0–34.0)
MCHC: 33.7 g/dL (ref 30.0–36.0)
MCV: 91.5 fL (ref 78.0–100.0)
MONO ABS: 0.3 10*3/uL (ref 0.1–1.0)
MONOS PCT: 4 % (ref 3–12)
MPV: 11.3 fL (ref 8.6–12.4)
Neutro Abs: 4.6 10*3/uL (ref 1.7–7.7)
Neutrophils Relative %: 71 % (ref 43–77)
Platelets: 164 10*3/uL (ref 150–400)
RBC: 5.29 MIL/uL (ref 4.22–5.81)
RDW: 13.7 % (ref 11.5–15.5)
WBC: 6.5 10*3/uL (ref 4.0–10.5)

## 2015-04-08 LAB — LIPID PANEL
CHOL/HDL RATIO: 3.6 ratio (ref <=5.0)
CHOLESTEROL: 93 mg/dL — AB (ref 125–200)
HDL: 26 mg/dL — ABNORMAL LOW (ref >=40)
LDL Cholesterol: 56 mg/dL (ref <130)
Triglycerides: 56 mg/dL (ref <150)
VLDL: 11 mg/dL (ref <30)

## 2015-04-08 LAB — COMPLETE METABOLIC PANEL WITH GFR
ALK PHOS: 79 U/L (ref 40–115)
ALT: 22 U/L (ref 9–46)
AST: 19 U/L (ref 10–35)
Albumin: 3.7 g/dL (ref 3.6–5.1)
BUN: 14 mg/dL (ref 7–25)
CALCIUM: 9 mg/dL (ref 8.6–10.3)
CHLORIDE: 106 mmol/L (ref 98–110)
CO2: 28 mmol/L (ref 20–31)
Creat: 0.92 mg/dL (ref 0.70–1.25)
GFR, EST NON AFRICAN AMERICAN: 86 mL/min (ref >=60)
Glucose, Bld: 93 mg/dL (ref 70–99)
POTASSIUM: 4.6 mmol/L (ref 3.5–5.3)
Sodium: 141 mmol/L (ref 135–146)
Total Bilirubin: 1.1 mg/dL (ref 0.2–1.2)
Total Protein: 6.1 g/dL (ref 6.1–8.1)

## 2015-04-08 MED ORDER — FUROSEMIDE 40 MG PO TABS: 40.0000 mg | ORAL_TABLET | Freq: Every day | ORAL | Status: DC

## 2015-04-08 NOTE — Telephone Encounter (Signed)
Left message for patient to call.

## 2015-04-08 NOTE — Telephone Encounter (Signed)
Margreta Journey called in stating that the pt was in the office today with chest pain, SOB, increased vascular congestion and mild CHF. He would like for the pt to been seen by his cardiologist as soon as possible.  Thanks

## 2015-04-08 NOTE — Progress Notes (Signed)
Subjective:    Patient ID: John [name], male    DOB: 1950-10-24, 64 y.o.   MRN: [id number]  HPI Patient is here today for follow-up of his diabetes. However he admits recently that he has had increasing shortness of breath and dyspnea on exertion. This is been gradually building. He does have an underlying history of COPD and he also smokes. However yesterday he developed a pain in the center of his chest that radiated between his shoulder blades. It was mild. It resolved after approximately 1 hour. He is also been burping frequently and has much more indigestion recently. I performed an EKG today in the office which shows normal sinus rhythm. There are Q waves in the inferior and lateral leads but this is chronic and unchanged from an EKG in 2014. The patient denies any classic symptoms of angina. He does report more shortness of breath with exertion recently.  He states his fasting blood sugars are excellent and all below 130. His two-hour postprandial sugars are below 160. He denies any hypoglycemic episodes. He is due for a flu shot. His blood pressure today is well controlled at 138/80 Past Medical History  Diagnosis Date  . Coronary artery disease   . Myocardial infarction ([place])     posterior MI  . Shortness of breath   . Sleep apnea     on CPAP; 04/28/2007 split-night - AHI during total sleep 44.43/hr and REM 72.56/hr  . Hypertension   . Hyperlipidemia   . Diabetes mellitus   . GERD (gastroesophageal reflux disease)   . Left foot drop     r/t past disk srugery - uses Kevlar brace  . History of nuclear stress test 04/04/2011    lexiscan; mod-large in size fixed inferolateral defect (scar); non-diagnostic for ischemia; low risk scan    Past Surgical History  Procedure Laterality Date  . Back surgery  1985  . Transthoracic echocardiogram  07/29/2010    EF 50=55%, mod inf wall hypokinesis & mild post wall hypokinesis; LA mild-mod dilated; mild mitral annular calcif & mild MR; mild  TR & elevated RV systolic pressure; AV mildly sclerotic; mild aortic root dilatation   . Cardiac catheterization  2010    6 stents total  . Coronary angioplasty with stent placement  03/1994    angioplasty & stenting (non-DES) of circumflex/prox ramus intermedius  . Coronary angioplasty with stent placement  10/1994    large iliac [id number] stent to RCA  . Coronary angioplasty  09/1998    mid-distal RCA balloon dilatation, 4.5 & 5.0 stents   . Cardiac catheterization  01/2000    percutaneous transluminal coronary balloon angioplasty of mid RCA stenotic lesion  . Coronary angioplasty with stent placement  12/2002    4.24m stents x2 of RCA  . Coronary angioplasty with stent placement  01/2005    cutting balloon arthrectomy of distal RCA & Cypher DES 3.5x13; cutting balloon arthrectomy of mid RCA with Cypher DES 3.5x18  . Cardiac catheterization  06/2006    no stenting; ischemic cardiomyopathy, EF 40-45%  . Coronary angioplasty with stent placement  11/2008    stenting of mid RCA with 4.0x178mdriver, non-DES  . Left heart catheterization with coronary angiogram N/A 02/27/2012    Procedure: LEFT HEART CATHETERIZATION WITH CORONARY ANGIOGRAM;  Surgeon: JoLorretta HarpMD;  [facility];  Service: Cardiovascular;  Laterality: N/A;   Current Outpatient Prescriptions on File Prior to Visit  Medication Sig Dispense Refill  . aspirin 81 MG  tablet Take 81 mg by mouth daily.    . benazepril (LOTENSIN) 5 MG tablet Take 1 tablet (5 mg total) by mouth at bedtime. 30 tablet 11  . busPIRone (BUSPAR) 10 MG tablet Take 1 tablet (10 mg total) by mouth daily. 30 tablet 0  . BYSTOLIC 10 MG tablet TAKE 1 TABLET BY MOUTH EVERY DAY 30 tablet 10  . clopidogrel (PLAVIX) 75 MG tablet TAKE 1 TABLET BY MOUTH EVERY DAY 30 tablet 7  . CRESTOR 20 MG tablet TAKE 1 TABLET (20 MG TOTAL) BY MOUTH AT BEDTIME. 30 tablet 7  . glucose blood (ONE TOUCH ULTRA TEST) test strip 1 each by Other route 3 (three) times daily. Use as  instructed 100 each 11  . Insulin Detemir (LEVEMIR FLEXTOUCH) 100 UNIT/ML Pen Inject 26 Units into the skin at bedtime. ICD E11.65 15 mL 0  . Insulin Pen Needle (NOVOFINE) 32G X 6 MM MISC 1 each by Other route daily. 100 each 3  . montelukast (SINGULAIR) 10 MG tablet TAKE 1 TABLET BY MOUTH EVERY DAY 30 tablet 11  . nebivolol (BYSTOLIC) 10 MG tablet Take 10 mg by mouth daily.    . pantoprazole (PROTONIX) 40 MG tablet Take 1 tablet (40 mg total) by mouth daily. 30 tablet 10  . UNABLE TO FIND CPAP therapy    . nitroGLYCERIN (NITROSTAT) 0.4 MG SL tablet Place 1 tablet (0.4 mg total) under the tongue every 5 (five) minutes x 3 doses as needed for chest pain. 25 tablet 3   No current facility-administered medications on file prior to visit.   Allergies  Allergen Reactions  . Fish Allergy Itching  . Fish Oil Itching and Rash   Social History   Social History  . Marital Status: Married    Spouse Name: N/A  . Number of Children: 3  . Years of Education: N/A   Occupational History  . 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 [employer]   Social History Main Topics  . Smoking status: Current Some Day Smoker -- 1.00 packs/day for 50 years    Types: Cigarettes  . Smokeless tobacco: Never Used  . Alcohol Use: Yes     Comment: occasionally  . Drug Use: No  . Sexual Activity: Yes   Other Topics Concern  . Not on file   Social History Narrative      Review of Systems  All other systems reviewed and are negative.      Objective:   Physical Exam  Constitutional: He appears well-developed and well-nourished.  Neck: No JVD present.  Cardiovascular: Normal rate, regular rhythm, normal heart sounds and intact distal pulses.  Exam reveals no gallop and no friction rub.   No murmur heard. Pulmonary/Chest: Effort normal and breath sounds normal. He has no wheezes. He has no rales. He exhibits no tenderness.  Abdominal: Soft. Bowel sounds are normal. He exhibits no distension and no mass.  There is no tenderness. There is no rebound and no guarding.  Musculoskeletal: He exhibits no edema.  Lymphadenopathy:    He has no cervical adenopathy.  Skin: No rash noted. No erythema.  Vitals reviewed.         Assessment & Plan:  Other chest pain - Plan: EKG 12-Lead, DG Chest 2 View, Ambulatory referral to Cardiology  Controlled type 2 diabetes mellitus without complication, with long-term current use of insulin ([place]) - Plan: CBC with Differential/Platelet, COMPLETE METABOLIC PANEL WITH GFR, Lipid panel, Hemoglobin A1c, Microalbumin, urine  Chest  pain is atypical. However given his dyspnea on exertion, his past medical history, his diabetes, his hypertension, his hyperlipidemia, and his smoking, I do believe he would benefit from a stress test of his heart. Therefore I will refer the patient back to his cardiologist. His last stress test was in 2012. I will also obtain a chest x-ray given his smoking history to evaluate for pulmonary pathology as potential cause of his dyspnea on exertion and chest pain. However I believe that his chest pain may likely be related to gastroesophageal reflux. Therefore I will have the patient discontinue pantoprazole and replace it with dexilant 60 mg by mouth every morning. I will also check a fasting lipid panel. Goal LDL cholesterol is less than 70. I will check a hemoglobin A1c. His goal hemoglobin A1c be less than 7. His blood pressure is at goal. I recommended smoking cessation. The patient received his flu shot today.

## 2015-04-08 NOTE — Addendum Note (Signed)
Addended by: Shary Decamp B on: 04/08/2015 11:56 AM   Modules accepted: Orders

## 2015-04-09 LAB — HEMOGLOBIN A1C
Hgb A1c MFr Bld: 6.3 % — ABNORMAL HIGH (ref <5.7)
Mean Plasma Glucose: 134 mg/dL — ABNORMAL HIGH (ref <117)

## 2015-04-09 LAB — MICROALBUMIN, URINE: MICROALB UR: 1.7 mg/dL

## 2015-04-09 NOTE — Telephone Encounter (Signed)
Spoke to patient Appointment offered. Patient states he is not in distress at present and was aware to got to hospital if needed. Schedule for 03/23/15 at 3:30  Pm Patient aware

## 2015-04-12 ENCOUNTER — Ambulatory Visit (INDEPENDENT_AMBULATORY_CARE_PROVIDER_SITE_OTHER): Payer: Medicare Other | Admitting: Family Medicine

## 2015-04-12 ENCOUNTER — Encounter: Payer: Self-pay | Admitting: Family Medicine

## 2015-04-12 VITALS — BP 130/80 | HR 62 | Temp 98.1°F | Resp 18 | Ht 72.0 in | Wt 218.0 lb

## 2015-04-12 DIAGNOSIS — J810 Acute pulmonary edema: Secondary | ICD-10-CM | POA: Diagnosis not present

## 2015-04-12 DIAGNOSIS — I2510 Atherosclerotic heart disease of native coronary artery without angina pectoris: Secondary | ICD-10-CM | POA: Diagnosis not present

## 2015-04-12 NOTE — Progress Notes (Signed)
Subjective:    Patient ID: John [name], male    DOB: [birth year], 64 y.o.   MRN: [id number]  HPI 04/08/15 Patient is here today for follow-up of his diabetes. However he admits recently that he has had increasing shortness of breath and dyspnea on exertion. This has been gradually building. He does have an underlying history of COPD and he also smokes. However yesterday he developed a pain in the center of his chest that radiated between his shoulder blades. It was mild. It resolved after approximately 1 hour. He is also been burping frequently and has much more indigestion recently. I performed an EKG today in the office which shows normal sinus rhythm. There are Q waves in the inferior and lateral leads but this is chronic and unchanged from an EKG in 2014. The patient denies any classic symptoms of angina. He does report more shortness of breath with exertion recently.  He states his fasting blood sugars are excellent and all below 130. His two-hour postprandial sugars are below 160. He denies any hypoglycemic episodes. He is due for a flu shot. His blood pressure today is well controlled at 138/80.  At that time, my plan was: Chest pain is atypical. However given his dyspnea on exertion, his past medical history, his diabetes, his hypertension, his hyperlipidemia, and his smoking, I do believe he would benefit from a stress test of his heart. Therefore I will refer the patient back to his cardiologist. His last stress test was in 2012. I will also obtain a chest x-ray given his smoking history to evaluate for pulmonary pathology as potential cause of his dyspnea on exertion and chest pain. However I believe that his chest pain may likely be related to gastroesophageal reflux. Therefore I will have the patient discontinue pantoprazole and replace it with dexilant 60 mg by mouth every morning. I will also check a fasting lipid panel. Goal LDL cholesterol is less than 70. I will check a hemoglobin A1c.  His goal hemoglobin A1c be less than 7. His blood pressure is at goal. I recommended smoking cessation. The patient received his flu shot today.  04/12/15 The patient's lab work was excellent. However his chest x-ray revealed: Cardiomegaly is mild pulmonary vascular prominence interstitial prominence suggesting mild congestive heart failure. Stable pulmonary nodule right mid lung consistent with small granuloma. No pleural effusion or pneumothorax. Degenerative changes thoracic spine .  Therefore I had the patient begin Lasix 40 mg by mouth daily and follow-up today to see if his breathing would improve. I also asked my staff to try to expedite his cardiology referral. He is here today for follow-up.  Patient's breathing has substantially improved on Lasix 40 mg by mouth daily. He is scheduled to see his cardiologist in the first week of November. He has had no further chest pain since starting dexilant.  He has experienced some cramping in his hands since starting the Lasix  Past Medical History  Diagnosis Date  . Coronary artery disease   . Myocardial infarction ([place])     posterior MI  . Shortness of breath   . Sleep apnea     on CPAP; 04/28/2007 split-night - AHI during total sleep 44.43/hr and REM 72.56/hr  . Hypertension   . Hyperlipidemia   . Diabetes mellitus   . GERD (gastroesophageal reflux disease)   . Left foot drop     r/t past disk srugery - uses Kevlar brace  . History of nuclear stress test 04/04/2011  lexiscan; mod-large in size fixed inferolateral defect (scar); non-diagnostic for ischemia; low risk scan    Past Surgical History  Procedure Laterality Date  . Back surgery  1985  . Transthoracic echocardiogram  07/29/2010    EF 50=55%, mod inf wall hypokinesis & mild post wall hypokinesis; LA mild-mod dilated; mild mitral annular calcif & mild MR; mild TR & elevated RV systolic pressure; AV mildly sclerotic; mild aortic root dilatation   . Cardiac catheterization   2010    6 stents total  . Coronary angioplasty with stent placement  03/1994    angioplasty & stenting (non-DES) of circumflex/prox ramus intermedius  . Coronary angioplasty with stent placement  10/1994    large iliac [id number] stent to RCA  . Coronary angioplasty  09/1998    mid-distal RCA balloon dilatation, 4.5 & 5.0 stents   . Cardiac catheterization  01/2000    percutaneous transluminal coronary balloon angioplasty of mid RCA stenotic lesion  . Coronary angioplasty with stent placement  12/2002    4.7m stents x2 of RCA  . Coronary angioplasty with stent placement  01/2005    cutting balloon arthrectomy of distal RCA & Cypher DES 3.5x13; cutting balloon arthrectomy of mid RCA with Cypher DES 3.5x18  . Cardiac catheterization  06/2006    no stenting; ischemic cardiomyopathy, EF 40-45%  . Coronary angioplasty with stent placement  11/2008    stenting of mid RCA with 4.0x130mdriver, non-DES  . Left heart catheterization with coronary angiogram N/A 02/27/2012    Procedure: LEFT HEART CATHETERIZATION WITH CORONARY ANGIOGRAM;  Surgeon: JoLorretta HarpMD;  [facility];  Service: Cardiovascular;  Laterality: N/A;   Current Outpatient Prescriptions on File Prior to Visit  Medication Sig Dispense Refill  . aspirin 81 MG tablet Take 81 mg by mouth daily.    . benazepril (LOTENSIN) 5 MG tablet Take 1 tablet (5 mg total) by mouth at bedtime. 30 tablet 11  . busPIRone (BUSPAR) 10 MG tablet Take 1 tablet (10 mg total) by mouth daily. 30 tablet 0  . BYSTOLIC 10 MG tablet TAKE 1 TABLET BY MOUTH EVERY DAY 30 tablet 10  . clopidogrel (PLAVIX) 75 MG tablet TAKE 1 TABLET BY MOUTH EVERY DAY 30 tablet 7  . CRESTOR 20 MG tablet TAKE 1 TABLET (20 MG TOTAL) BY MOUTH AT BEDTIME. 30 tablet 7  . furosemide (LASIX) 40 MG tablet Take 1 tablet (40 mg total) by mouth daily. 30 tablet 0  . glucose blood (ONE TOUCH ULTRA TEST) test strip 1 each by Other route 3 (three) times daily. Use as instructed 100 each 11    . Insulin Detemir (LEVEMIR FLEXTOUCH) 100 UNIT/ML Pen Inject 26 Units into the skin at bedtime. ICD E11.65 15 mL 0  . Insulin Pen Needle (NOVOFINE) 32G X 6 MM MISC 1 each by Other route daily. 100 each 3  . montelukast (SINGULAIR) 10 MG tablet TAKE 1 TABLET BY MOUTH EVERY DAY 30 tablet 11  . nebivolol (BYSTOLIC) 10 MG tablet Take 10 mg by mouth daily.    . nitroGLYCERIN (NITROSTAT) 0.4 MG SL tablet Place 1 tablet (0.4 mg total) under the tongue every 5 (five) minutes x 3 doses as needed for chest pain. 25 tablet 3  . pantoprazole (PROTONIX) 40 MG tablet Take 1 tablet (40 mg total) by mouth daily. 30 tablet 10  . UNABLE TO FIND CPAP therapy     No current facility-administered medications on file prior to visit.   Allergies  Allergen  Reactions  . Fish Allergy Itching  . Fish Oil Itching and Rash   Social History   Social History  . Marital Status: Married    Spouse Name: N/A  . Number of Children: 3  . Years of Education: N/A   Occupational History  . 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 [employer]   Social History Main Topics  . Smoking status: Current Some Day Smoker -- 1.00 packs/day for 50 years    Types: Cigarettes  . Smokeless tobacco: Never Used  . Alcohol Use: Yes     Comment: occasionally  . Drug Use: No  . Sexual Activity: Yes   Other Topics Concern  . Not on file   Social History Narrative      Review of Systems  All other systems reviewed and are negative.      Objective:   Physical Exam  Constitutional: He appears well-developed and well-nourished.  Neck: No JVD present.  Cardiovascular: Normal rate, regular rhythm, normal heart sounds and intact distal pulses.  Exam reveals no gallop and no friction rub.   No murmur heard. Pulmonary/Chest: Effort normal and breath sounds normal. He has no wheezes. He has no rales. He exhibits no tenderness.  Abdominal: Soft. Bowel sounds are normal. He exhibits no distension and no mass. There is no tenderness.  There is no rebound and no guarding.  Musculoskeletal: He exhibits no edema.  Lymphadenopathy:    He has no cervical adenopathy.  Skin: No rash noted. No erythema.  Vitals reviewed.         Assessment & Plan:  Acute pulmonary edema ([place]) - Plan: BASIC METABOLIC PANEL WITH GFR  Patient's dyspnea on exertion seems to be related to pulmonary edema. I would like to try to maintain his weight around 217 pounds. Continue Lasix 40 mg by mouth daily. He will need to temporarily discontinue the medication if his weight falls more than 2 pounds below 217. I will also check a BMP to monitor his potassium and renal function. I will await the consultation with cardiology. Continue the acid reflux medication for now. If patient develops chest pain again, I want him to go to the hospital

## 2015-04-13 LAB — BASIC METABOLIC PANEL WITH GFR
BUN: 24 mg/dL (ref 7–25)
CALCIUM: 10.2 mg/dL (ref 8.6–10.3)
CO2: 30 mmol/L (ref 20–31)
Chloride: 104 mmol/L (ref 98–110)
Creat: 1.01 mg/dL (ref 0.70–1.25)
GFR, EST AFRICAN AMERICAN: 89 mL/min (ref >=60)
GFR, Est Non African American: 77 mL/min (ref >=60)
GLUCOSE: 110 mg/dL — AB (ref 70–99)
Potassium: 4.7 mmol/L (ref 3.5–5.3)
Sodium: 143 mmol/L (ref 135–146)

## 2015-04-23 ENCOUNTER — Ambulatory Visit (INDEPENDENT_AMBULATORY_CARE_PROVIDER_SITE_OTHER): Payer: Medicare Other | Admitting: Cardiovascular Disease

## 2015-04-23 ENCOUNTER — Encounter: Payer: Self-pay | Admitting: Cardiovascular Disease

## 2015-04-23 VITALS — BP 154/98 | HR 54 | Ht 72.0 in | Wt 222.0 lb

## 2015-04-23 DIAGNOSIS — K219 Gastro-esophageal reflux disease without esophagitis: Secondary | ICD-10-CM

## 2015-04-23 DIAGNOSIS — Z79899 Other long term (current) drug therapy: Secondary | ICD-10-CM | POA: Diagnosis not present

## 2015-04-23 DIAGNOSIS — I10 Essential (primary) hypertension: Secondary | ICD-10-CM

## 2015-04-23 DIAGNOSIS — I25810 Atherosclerosis of coronary artery bypass graft(s) without angina pectoris: Secondary | ICD-10-CM

## 2015-04-23 DIAGNOSIS — G4733 Obstructive sleep apnea (adult) (pediatric): Secondary | ICD-10-CM

## 2015-04-23 DIAGNOSIS — E785 Hyperlipidemia, unspecified: Secondary | ICD-10-CM

## 2015-04-23 DIAGNOSIS — Z9989 Dependence on other enabling machines and devices: Secondary | ICD-10-CM

## 2015-04-23 DIAGNOSIS — R0609 Other forms of dyspnea: Secondary | ICD-10-CM

## 2015-04-23 DIAGNOSIS — R079 Chest pain, unspecified: Secondary | ICD-10-CM

## 2015-04-23 MED ORDER — NITROGLYCERIN 0.4 MG SL SUBL: 0.4000 mg | SUBLINGUAL_TABLET | SUBLINGUAL | Status: DC | PRN

## 2015-04-23 MED ORDER — BENAZEPRIL HCL 10 MG PO TABS: 10.0000 mg | ORAL_TABLET | Freq: Every day | ORAL | Status: DC

## 2015-04-23 NOTE — Patient Instructions (Signed)
Your physician has recommended you make the following change in your medication: the lotensin has been increased to 10 mg. Take the furosemide 20 mg daily.  Your physician recommends that you return for lab work in 1 week.  Your physician has requested that you have an echocardiogram. Echocardiography is a painless test that uses sound waves to create images of your heart. It provides your doctor with information about the size and shape of your heart and how well your heart's chambers and valves are working. This procedure takes approximately one hour. There are no restrictions for this procedure.  Your physician has requested that you have a lexiscan myoview. For further information please visit [email or web address]. Please follow instruction sheet, as given.  Your physician recommends that you schedule a follow-up appointment in: 3 weeks with Dr. Claiborne Billings

## 2015-04-25 ENCOUNTER — Encounter: Payer: Self-pay | Admitting: Cardiovascular Disease

## 2015-04-25 NOTE — Progress Notes (Signed)
Patient ID: John [name], male   DOB: [birth year], 64 y.o.   MRN: [id number]     HPI:  John [name] is a 64 y.o. male who presents for a [place] cardiology followup evaluation.  He is a former patient of Dr. Rollene Fare.  Mr. Murakami has known CAD and underwent multiple percutaneous cardiac interventions with stenting to circumflex and RCA. His last cardiac catheterization was done by [name] in September 2013 which showed his RCA stented patent but he did had 40% narrowing in the very distal aspect. He has a history of hypertension, as well as long-standing tobacco use with mild polycythemia. I had seen him in a sleep clinic after he obtained a new CPAP machine last year. He has severe sleep apnea with an AHI on his initial diagnostic study of 44 per hour overall and 72.6 per hour with REM sleep.  A download last year  showed excellent benefit from CPAP with an AHI of 3.1.  His initial stent to his RCA was in 1996 was a Cypher PS 1540 sent. In 1999 he underwent non-DES stenting to circumflex after he presented with a posterior MI. He had additional 4-5 stents placed in his RCA.  He has a history of prior back surgery and has had a left foot drop.  He has significant obstructive sleep apnea and continues to utilize CPAP therapy.  When I saw him last year despite CPAP therapy.  He noted fatigue and residual daytime sleepiness.  I did suggest the possibility of Nuvigil or Provigil if necessary.    He was recently seen by [name] 3 weeks ago. The patient had complained of some atypical chest pain.  There was some concern that this may be reflux and pantoprazole was discontinued and replaced with dexilant. However, was felt that the patient may require another stress test since his last evaluation was in 2012.  He recently has had issues with swelling and increased weight and has been taking Lasix 40 mg intermittently, depending upon his weight. He now presents for evaluation.    Past  Medical History  Diagnosis Date  . Coronary artery disease   . Myocardial infarction ([place])     posterior MI  . Shortness of breath   . Sleep apnea     on CPAP; 04/28/2007 split-night - AHI during total sleep 44.43/hr and REM 72.56/hr  . Hypertension   . Hyperlipidemia   . Diabetes mellitus   . GERD (gastroesophageal reflux disease)   . Left foot drop     r/t past disk srugery - uses Kevlar brace  . History of nuclear stress test 04/04/2011    lexiscan; mod-large in size fixed inferolateral defect (scar); non-diagnostic for ischemia; low risk scan     Past Surgical History  Procedure Laterality Date  . Back surgery  1985  . Transthoracic echocardiogram  07/29/2010    EF 50=55%, mod inf wall hypokinesis & mild post wall hypokinesis; LA mild-mod dilated; mild mitral annular calcif & mild MR; mild TR & elevated RV systolic pressure; AV mildly sclerotic; mild aortic root dilatation   . Cardiac catheterization  2010    6 stents total  . Coronary angioplasty with stent placement  03/1994    angioplasty & stenting (non-DES) of circumflex/prox ramus intermedius  . Coronary angioplasty with stent placement  10/1994    large iliac [id number] stent to RCA  . Coronary angioplasty  09/1998    mid-distal RCA balloon dilatation, 4.5 & 5.0 stents   .  Cardiac catheterization  01/2000    percutaneous transluminal coronary balloon angioplasty of mid RCA stenotic lesion  . Coronary angioplasty with stent placement  12/2002    4.46m stents x2 of RCA  . Coronary angioplasty with stent placement  01/2005    cutting balloon arthrectomy of distal RCA & Cypher DES 3.5x13; cutting balloon arthrectomy of mid RCA with Cypher DES 3.5x18  . Cardiac catheterization  06/2006    no stenting; ischemic cardiomyopathy, EF 40-45%  . Coronary angioplasty with stent placement  11/2008    stenting of mid RCA with 4.0x164mdriver, non-DES  . Left heart catheterization with coronary angiogram N/A 02/27/2012    Procedure: LEFT HEART  CATHETERIZATION WITH CORONARY ANGIOGRAM;  Surgeon: JoLorretta HarpMD;  [facility];  Service: Cardiovascular;  Laterality: N/A;    Allergies  Allergen Reactions  . Fish Allergy Itching  . Fish Oil Itching and Rash    Current Outpatient Prescriptions  Medication Sig Dispense Refill  . aspirin 81 MG tablet Take 81 mg by mouth daily.    . busPIRone (BUSPAR) 10 MG tablet Take 1 tablet (10 mg total) by mouth daily. 30 tablet 0  . clopidogrel (PLAVIX) 75 MG tablet TAKE 1 TABLET BY MOUTH EVERY DAY 30 tablet 7  . CRESTOR 20 MG tablet TAKE 1 TABLET (20 MG TOTAL) BY MOUTH AT BEDTIME. 30 tablet 7  . furosemide (LASIX) 40 MG tablet Take 1 tablet (40 mg total) by mouth daily. (Patient taking differently: Take 40 mg by mouth daily as needed. ) 30 tablet 0  . glucose blood (ONE TOUCH ULTRA TEST) test strip 1 each by Other route 3 (three) times daily. Use as instructed 100 each 11  . Insulin Detemir (LEVEMIR FLEXTOUCH) 100 UNIT/ML Pen Inject 26 Units into the skin at bedtime. ICD E11.65 15 mL 0  . Insulin Pen Needle (NOVOFINE) 32G X 6 MM MISC 1 each by Other route daily. 100 each 3  . montelukast (SINGULAIR) 10 MG tablet TAKE 1 TABLET BY MOUTH EVERY DAY 30 tablet 11  . nebivolol (BYSTOLIC) 10 MG tablet Take 10 mg by mouth daily.    . pantoprazole (PROTONIX) 40 MG tablet Take 1 tablet (40 mg total) by mouth daily. 30 tablet 10  . UNABLE TO FIND CPAP therapy    . benazepril (LOTENSIN) 10 MG tablet Take 1 tablet (10 mg total) by mouth daily. 30 tablet 6  . nitroGLYCERIN (NITROSTAT) 0.4 MG SL tablet Place 1 tablet (0.4 mg total) under the tongue every 5 (five) minutes x 3 doses as needed for chest pain. 25 tablet 3   No current facility-administered medications for this visit.    Social History   Social History  . Marital Status: Married    Spouse Name: N/A  . Number of Children: 3  . Years of Education: N/A   Occupational History  . maFreight forwarderther    [employer]    Social History Main Topics  . Smoking status: Current Some Day Smoker -- 1.00 packs/day for 50 years    Types: Cigarettes  . Smokeless tobacco: Never Used  . Alcohol Use: Yes     Comment: occasionally  . Drug Use: No  . Sexual Activity: Yes   Other Topics Concern  . Not on file   Social History Narrative   Social history is known that he works as a maFreight forwarderor [employer]There is a long-standing tobacco history. He rarely drinks alcohol.  Family  History  Problem Relation Age of Onset  . Heart attack Father    ROS General: Negative; No fevers, chills, or night sweats;  HEENT: Negative; No changes in vision or hearing, sinus congestion, difficulty swallowing Pulmonary: Negative; No cough, wheezing, shortness of breath, hemoptysis Cardiovascular: see history of present illness GI: Negative; No nausea, vomiting, diarrhea, or abdominal pain GU: Negative; No dysuria, hematuria, or difficulty voiding Musculoskeletal: Negative; no myalgias, joint pain, or weakness Hematologic/Oncology: Negative; no easy bruising, bleeding Endocrine: Negative; no heat/cold intolerance; no diabetes Neuro: Negative; no changes in balance, headaches Skin: Negative; No rashes or skin lesions Psychiatric: Negative; No behavioral problems, depression Sleep: Positive for obstructive sleep apnea.  Mild residual daytime sleepiness; no bruxism, restless legs, hypnogognic hallucinations, no cataplexy Other comprehensive 14 point system review is negative.   PE BP 154/98 mmHg  Pulse 54  Ht 6' (1.829 m)  Wt 222 lb (100.699 kg)  BMI 30.10 kg/m2   Repeat blood pressure 120/70. Wt Readings from Last 3 Encounters:  04/23/15 222 lb (100.699 kg)  04/12/15 218 lb (98.884 kg)  04/08/15 225 lb (102.059 kg)    General: Alert, oriented, no distress.  Skin: normal turgor, no rashes HEENT: Normocephalic, atraumatic. Pupils round and reactive; sclera anicteric;no lid lag. Extraocular muscles intact. Nose  without nasal septal hypertrophy Mouth/Parynx benign; Mallinpatti scale 3 Neck: No JVD, no carotid bruits; normal carotid upstroke Lungs: clear to ausculatation and percussion; no wheezing or rales Chest wall: no tenderness to palpitation Heart: RRR, s1 s2 normal 1/6 systolic murmur. No rubs thrills or heaves Abdomen: soft, nontender; no hepatosplenomehaly, BS+; abdominal aorta nontender and not dilated by palpation. Back: no CVA tenderness Pulses 2+ Extremities: no clubbing cyanosis or edema, Homan's sign negative  Neurologic: grossly nonfocal; cranial nerves grossly normal. Psychologic: normal affect and mood.  ECG (independently read by me): Sinus bradycardia 54 bpm with PVC.  Inferior and lateral Q waves concordant with inferior lateral MI  ECG (independently read by me): Sinus bradycardia 55 bpm.  Inferior Q waves compatible with old inferior MI.  No significant ST segment changes.  January 2015 ECG (independently read by me): Normal sinus rhythm at 75 beats per minute. Old inferior infarction with inferior Q waves. No other ST-T changes  LABS:  BMET  BMP Latest Ref Rng 04/12/2015 04/08/2015 06/04/2014  Glucose 70 - 99 mg/dL 110(H) 93 113(H)  BUN 7 - 25 mg/dL '24 14 13  '$ Creatinine 0.70 - 1.25 mg/dL 1.01 0.92 1.01  Sodium 135 - 146 mmol/L 143 141 143  Potassium 3.5 - 5.3 mmol/L 4.7 4.6 4.6  Chloride 98 - 110 mmol/L 104 106 107  CO2 20 - 31 mmol/L '30 28 25  '$ Calcium 8.6 - 10.3 mg/dL 10.2 9.0 9.0     Hepatic Function Panel     Component Value Date/Time   PROT 6.1 04/08/2015 0911   ALBUMIN 3.7 04/08/2015 0911   AST 19 04/08/2015 0911   ALT 22 04/08/2015 0911   ALKPHOS 79 04/08/2015 0911   BILITOT 1.1 04/08/2015 0911   BILIDIR 0.1 01/09/2007 1511   IBILI 0.5 01/09/2007 1511     CBC  CBC Latest Ref Rng 04/08/2015 12/25/2013 06/30/2013  WBC 4.0 - 10.5 K/uL 6.5 7.9 12.0(H)  Hemoglobin 13.0 - 17.0 g/dL 16.3 16.9 17.4(H)  Hematocrit 39.0 - 52.0 % 48.4 49.7 50.0  Platelets  150 - 400 K/uL 164 172 186     BNP No results found for: PROBNP  Lipid Panel     Component Value  Date/Time   CHOL 93* 04/08/2015 0911   TRIG 56 04/08/2015 0911   HDL 26* 04/08/2015 0911   CHOLHDL 3.6 04/08/2015 0911   VLDL 11 04/08/2015 0911   LDLCALC 56 04/08/2015 0911     RADIOLOGY: No results found.    ASSESSMENT AND PLAN:  Mr. Valenza is a 64 year-old Caucasian male who has CAD and has undergone prior extensive stenting to his right coronary artery and also stenting to his circumflex vessel. An echo Doppler study in 2013 showed ejection fraction of 45-50%. There was severe inferior, inferolateral and inferoseptal hypokinesis in the basal inferolateral wall segments consistent with scar. He does have mild MR mild TR. His last cardiac catheterization was done but by [name] in September 2013.  He has a history of hypertension.  His blood pressure today is stable at 120/70 when repeated by me on his current regimen of Bystolic 10 mg and benazepril 5 mg.  He has hyperlipidemia and is on Crestor 20 mg. He no longer is on Zetia.I reviewed his recent laboratory.  Recent lipid studies revealed continued excellent LDL at 56. He has GERD and had been on pantoprazole, but I'm not certain if he has switched to dexilant as recommended by [name]. There are no signs of edema.  His weight has recently been fluctuating between 225 and 215 pounds and he has adjusted taking Lasix 40 mg for increasing swelling and weight and when his weight has dropped to 215. He would not take any diuretic for several days and then his weight would again increased.  I have suggested that he try taking Lasix 20 mg daily rather than the previous 40 mg dose.  I also will further titrate his benazapril to 10 mg daily.  I am checking a follow-up Bmet in [facility].  I will schedule him for an echo Doppler study and will also schedule him for a Lexiscan Myoview study.  He will return in 3-4 weeks for reevaluation.  Time  spent 25 minutes  Troy Sine, MD, [facility]  04/25/2015 2:50 PM

## 2015-04-30 ENCOUNTER — Telehealth: Payer: Self-pay | Admitting: Cardiovascular Disease

## 2015-04-30 DIAGNOSIS — Z79899 Other long term (current) drug therapy: Secondary | ICD-10-CM | POA: Diagnosis not present

## 2015-04-30 DIAGNOSIS — R0609 Other forms of dyspnea: Secondary | ICD-10-CM | POA: Diagnosis not present

## 2015-04-30 NOTE — Telephone Encounter (Signed)
Patient was supposed to have these studies done and then see Dr. Claiborne Billings within the 3 weeks. The tests were to be scheduled first.

## 2015-04-30 NOTE — Telephone Encounter (Signed)
John [name] is calling because he is supposed to see Dr. Claiborne Billings in 3 wks after his last visit. Do he needs to come in after the stress and echo test. The Echo and stress test is on 05/06/15 ..thanks

## 2015-05-01 LAB — BASIC METABOLIC PANEL
BUN: 17 mg/dL (ref 7–25)
CALCIUM: 9.4 mg/dL (ref 8.6–10.3)
CHLORIDE: 103 mmol/L (ref 98–110)
CO2: 33 mmol/L — ABNORMAL HIGH (ref 20–31)
CREATININE: 1.01 mg/dL (ref 0.70–1.25)
Glucose, Bld: 152 mg/dL — ABNORMAL HIGH (ref 65–99)
Potassium: 4.6 mmol/L (ref 3.5–5.3)
Sodium: 142 mmol/L (ref 135–146)

## 2015-05-01 LAB — BRAIN NATRIURETIC PEPTIDE: BRAIN NATRIURETIC PEPTIDE: 56 pg/mL (ref 0.0–100.0)

## 2015-05-04 ENCOUNTER — Telehealth (HOSPITAL_COMMUNITY): Payer: Self-pay

## 2015-05-04 NOTE — Telephone Encounter (Signed)
Encounter complete. 

## 2015-05-06 ENCOUNTER — Ambulatory Visit (HOSPITAL_BASED_OUTPATIENT_CLINIC_OR_DEPARTMENT_OTHER): Payer: Medicare Other

## 2015-05-06 ENCOUNTER — Other Ambulatory Visit (HOSPITAL_COMMUNITY): Payer: Medicare Other

## 2015-05-06 ENCOUNTER — Other Ambulatory Visit: Payer: Self-pay

## 2015-05-06 ENCOUNTER — Ambulatory Visit (HOSPITAL_COMMUNITY)
Admission: RE | Admit: 2015-05-06 | Discharge: 2015-05-06 | Disposition: A | Discharge status: Home / Self Care | Payer: Medicare Other | Source: Ambulatory Visit | Attending: Cardiology | Admitting: Cardiology

## 2015-05-06 DIAGNOSIS — I25810 Atherosclerosis of coronary artery bypass graft(s) without angina pectoris: Secondary | ICD-10-CM

## 2015-05-06 LAB — MYOCARDIAL PERFUSION IMAGING
CHL CUP NUCLEAR SDS: 0
CHL CUP RESTING HR STRESS: 54 {beats}/min
LV sys vol: 123 mL
LVDIAVOL: 179 mL
Peak HR: 75 {beats}/min
SRS: 12
SSS: 12
TID: 1.1

## 2015-05-06 MED ORDER — REGADENOSON 0.4 MG/5ML IV SOLN
0.4000 mg | Freq: Once | INTRAVENOUS | Status: AC
  Administered 2015-05-06: 0.4 mg via INTRAVENOUS

## 2015-05-06 MED ORDER — TECHNETIUM TC 99M SESTAMIBI GENERIC - CARDIOLITE
10.6000 | Freq: Once | INTRAVENOUS | Status: AC | PRN
  Administered 2015-05-06: 10.6 via INTRAVENOUS

## 2015-05-06 MED ORDER — PERFLUTREN LIPID MICROSPHERE
1.0000 mL | INTRAVENOUS | Status: AC | PRN
  Administered 2015-05-06: 1 mL via INTRAVENOUS

## 2015-05-06 MED ORDER — TECHNETIUM TC 99M SESTAMIBI GENERIC - CARDIOLITE
31.4000 | Freq: Once | INTRAVENOUS | Status: AC | PRN
  Administered 2015-05-06: 31.4 via INTRAVENOUS

## 2015-05-17 ENCOUNTER — Other Ambulatory Visit: Payer: Self-pay | Admitting: Cardiovascular Disease

## 2015-05-17 NOTE — Telephone Encounter (Signed)
Rx(s) sent to pharmacy electronically.  

## 2015-05-20 ENCOUNTER — Encounter: Payer: Self-pay | Admitting: Cardiovascular Disease

## 2015-05-20 ENCOUNTER — Ambulatory Visit (INDEPENDENT_AMBULATORY_CARE_PROVIDER_SITE_OTHER): Payer: Medicare Other | Admitting: Cardiovascular Disease

## 2015-05-20 VITALS — BP 126/74 | HR 57 | Ht 72.0 in | Wt 227.0 lb

## 2015-05-20 DIAGNOSIS — G4733 Obstructive sleep apnea (adult) (pediatric): Secondary | ICD-10-CM | POA: Diagnosis not present

## 2015-05-20 DIAGNOSIS — I25810 Atherosclerosis of coronary artery bypass graft(s) without angina pectoris: Secondary | ICD-10-CM | POA: Diagnosis not present

## 2015-05-20 DIAGNOSIS — I10 Essential (primary) hypertension: Secondary | ICD-10-CM

## 2015-05-20 DIAGNOSIS — Z9989 Dependence on other enabling machines and devices: Secondary | ICD-10-CM

## 2015-05-20 DIAGNOSIS — E785 Hyperlipidemia, unspecified: Secondary | ICD-10-CM | POA: Diagnosis not present

## 2015-05-20 DIAGNOSIS — E782 Mixed hyperlipidemia: Secondary | ICD-10-CM | POA: Insufficient documentation

## 2015-05-20 MED ORDER — FUROSEMIDE 20 MG PO TABS: 20.0000 mg | ORAL_TABLET | Freq: Every day | ORAL | Status: DC

## 2015-05-20 NOTE — Patient Instructions (Signed)
Your physician wants you to follow-up in: 6 months or sooner if needed. You will receive a reminder letter in the mail two months in advance. If you don't receive a letter, please call our office to schedule the follow-up appointment.   If you need a refill on your cardiac medications before your next appointment, please call your pharmacy. 

## 2015-05-20 NOTE — Progress Notes (Signed)
Patient ID: JUDA TOEPFER, male   DOB: 16-Oct-1950, 64 y.o.   MRN: [id number]     HPI:  DARIAN CANSLER is a 64 y.o. male who is a former patient of Dr. Rollene Fare. He presents for a cardiology followup evaluation following his recent nuclear perfusion study an echo Doppler evaluation.  Mr. Duquette has known CAD and underwent multiple percutaneous cardiac interventions with stenting to circumflex and RCA. His last cardiac catheterization was done by [name] in September 2013 which showed his RCA stented patent but he did had 40% narrowing in the very distal aspect. He has a history of hypertension, as well as long-standing tobacco use with mild polycythemia. I had seen him in a sleep clinic after he obtained a new CPAP machine last year. He has severe sleep apnea with an AHI on his initial diagnostic study of 44 per hour overall and 72.6 per hour with REM sleep.  A download last year  showed excellent benefit from CPAP with an AHI of 3.1.  His initial stent to his RCA was in 1996 was a Cypher PS [id number] sent. In 1999 he underwent non-DES stenting to circumflex after he presented with a posterior MI. He had additional 4-5 stents placed in his RCA.  He has a history of prior back surgery and has had a left foot drop.  He has significant obstructive sleep apnea and continues to utilize CPAP therapy.  When I saw him last year despite CPAP therapy.  He noted fatigue and residual daytime sleepiness.  I did suggest the possibility of Nuvigil or Provigil if necessary.    Prior to his last office visit he was seen  by [name] complaints of  atypical chest pain.  There was some concern that this may be reflux and pantoprazole was discontinued and replaced with dexilant. However, it was felt that the patient may require another stress test since his last evaluation was in 2012.  He also had had recent issues with some weight gain and shortness of breath and his intermittent Lasix dose changed to 40 mg  daily.  When I saw him, I scheduled him for follow-up echo Doppler study as well as nuclear perfusion study.  These were done on the same day.  On 05/06/2015.  Nuclear study was interpreted as a high risk study due to an ejection fraction of 31%.  There was a large fixed inferior and inferoseptal wall defect suggestive of scar and evidence for inferoseptal 8 kinesis.  There was no ischemia.  His prior nuclear study.  Ejection fraction in October 2012 was 38%.  An echo Doppler study done the same day.  However, showed discordant data and his ejection fraction was 50-55%.  Hypokinesis of the inferior inferolateral wall was noted.  There was grade 1 diastolic dysfunction.  There was mild aortic root dilatation at 41 mm.  His left atrium was severely dilated.  His right atrium was severely dilated.  There was trivial TR.  Of note, his last echo Doppler study had shown an EF of 45-50%.  Mr. Thede feels well.  When I saw him last  I reduced his Lasix to 20 g daily and increased his Lotensin to 10 mg.  He presents for follow-up evaluation.  Past Medical History  Diagnosis Date  . Coronary artery disease   . Myocardial infarction ([place])     posterior MI  . Shortness of breath   . Sleep apnea     on CPAP; 04/28/2007 split-night - AHI  during total sleep 44.43/hr and REM 72.56/hr  . Hypertension   . Hyperlipidemia   . Diabetes mellitus   . GERD (gastroesophageal reflux disease)   . Left foot drop     r/t past disk srugery - uses Kevlar brace  . History of nuclear stress test 04/04/2011    lexiscan; mod-large in size fixed inferolateral defect (scar); non-diagnostic for ischemia; low risk scan     Past Surgical History  Procedure Laterality Date  . Back surgery  1985  . Transthoracic echocardiogram  07/29/2010    EF 50=55%, mod inf wall hypokinesis & mild post wall hypokinesis; LA mild-mod dilated; mild mitral annular calcif & mild MR; mild TR & elevated RV systolic pressure; AV mildly sclerotic;  mild aortic root dilatation   . Cardiac catheterization  2010    6 stents total  . Coronary angioplasty with stent placement  03/1994    angioplasty & stenting (non-DES) of circumflex/prox ramus intermedius  . Coronary angioplasty with stent placement  10/1994    large iliac [id number] stent to RCA  . Coronary angioplasty  09/1998    mid-distal RCA balloon dilatation, 4.5 & 5.0 stents   . Cardiac catheterization  01/2000    percutaneous transluminal coronary balloon angioplasty of mid RCA stenotic lesion  . Coronary angioplasty with stent placement  12/2002    4.72m stents x2 of RCA  . Coronary angioplasty with stent placement  01/2005    cutting balloon arthrectomy of distal RCA & Cypher DES 3.5x13; cutting balloon arthrectomy of mid RCA with Cypher DES 3.5x18  . Cardiac catheterization  06/2006    no stenting; ischemic cardiomyopathy, EF 40-45%  . Coronary angioplasty with stent placement  11/2008    stenting of mid RCA with 4.0x129mdriver, non-DES  . Left heart catheterization with coronary angiogram N/A 02/27/2012    Procedure: LEFT HEART CATHETERIZATION WITH CORONARY ANGIOGRAM;  Surgeon: JoLorretta HarpMD;  [facility];  Service: Cardiovascular;  Laterality: N/A;    Allergies  Allergen Reactions  . Fish Allergy Itching  . Fish Oil Itching and Rash    Current Outpatient Prescriptions  Medication Sig Dispense Refill  . aspirin 81 MG tablet Take 81 mg by mouth daily.    . benazepril (LOTENSIN) 10 MG tablet Take 1 tablet (10 mg total) by mouth daily. 30 tablet 6  . busPIRone (BUSPAR) 10 MG tablet Take 1 tablet (10 mg total) by mouth daily. 30 tablet 0  . BYSTOLIC 10 MG tablet TAKE 1 TABLET BY MOUTH EVERY DAY 30 tablet 10  . clopidogrel (PLAVIX) 75 MG tablet TAKE 1 TABLET BY MOUTH EVERY DAY 30 tablet 7  . CRESTOR 20 MG tablet TAKE 1 TABLET (20 MG TOTAL) BY MOUTH AT BEDTIME. 30 tablet 7  . glucose blood (ONE TOUCH ULTRA TEST) test strip 1 each by Other route 3 (three) times  daily. Use as instructed 100 each 11  . Insulin Detemir (LEVEMIR FLEXTOUCH) 100 UNIT/ML Pen Inject 26 Units into the skin at bedtime. ICD E11.65 15 mL 0  . Insulin Pen Needle (NOVOFINE) 32G X 6 MM MISC 1 each by Other route daily. 100 each 3  . montelukast (SINGULAIR) 10 MG tablet TAKE 1 TABLET BY MOUTH EVERY DAY 30 tablet 11  . nitroGLYCERIN (NITROSTAT) 0.4 MG SL tablet Place 1 tablet (0.4 mg total) under the tongue every 5 (five) minutes x 3 doses as needed for chest pain. 25 tablet 3  . pantoprazole (PROTONIX) 40 MG tablet Take 1  tablet (40 mg total) by mouth daily. 30 tablet 10  . UNABLE TO FIND CPAP therapy    . furosemide (LASIX) 20 MG tablet Take 1 tablet (20 mg total) by mouth daily. 30 tablet 6   No current facility-administered medications for this visit.    Social History   Social History  . Marital Status: Married    Spouse Name: N/A  . Number of Children: 3  . Years of Education: N/A   Occupational History  . 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 [place]   Social History Main Topics  . Smoking status: Current Some Day Smoker -- 1.00 packs/day for 50 years    Types: Cigarettes  . Smokeless tobacco: Never Used  . Alcohol Use: Yes     Comment: occasionally  . Drug Use: No  . Sexual Activity: Yes   Other Topics Concern  . Not on file   Social History Narrative   Social history is known that he works as a Freight forwarder for [employer]. There is a long-standing tobacco history. He rarely drinks alcohol.  Family History  Problem Relation Age of Onset  . Heart attack Father    ROS General: Negative; No fevers, chills, or night sweats;  HEENT: Negative; No changes in vision or hearing, sinus congestion, difficulty swallowing Pulmonary: Negative; No cough, wheezing, shortness of breath, hemoptysis Cardiovascular: see history of present illness GI: Negative; No nausea, vomiting, diarrhea, or abdominal pain GU: Negative; No dysuria, hematuria, or difficulty  voiding Musculoskeletal: Negative; no myalgias, joint pain, or weakness Hematologic/Oncology: Negative; no easy bruising, bleeding Endocrine: Negative; no heat/cold intolerance; no diabetes Neuro: Negative; no changes in balance, headaches Skin: Negative; No rashes or skin lesions Psychiatric: Negative; No behavioral problems, depression Sleep: Positive for obstructive sleep apnea.  Mild residual daytime sleepiness; no bruxism, restless legs, hypnogognic hallucinations, no cataplexy Other comprehensive 14 point system review is negative.   PE BP 126/74 mmHg  Pulse 57  Ht 6' (1.829 m)  Wt 227 lb (102.967 kg)  BMI 30.78 kg/m2  SpO2 96%   Repeat blood pressure 122/70. Wt Readings from Last 3 Encounters:  05/20/15 227 lb (102.967 kg)  05/06/15 222 lb (100.699 kg)  04/23/15 222 lb (100.699 kg)    General: Alert, oriented, no distress.  Skin: normal turgor, no rashes HEENT: Normocephalic, atraumatic. Pupils round and reactive; sclera anicteric;no lid lag. Extraocular muscles intact. Nose without nasal septal hypertrophy Mouth/Parynx benign; Mallinpatti scale 3 Neck: No JVD, no carotid bruits; normal carotid upstroke Lungs: clear to ausculatation and percussion; no wheezing or rales Chest wall: no tenderness to palpitation Heart: RRR, s1 s2 normal 1/6 systolic murmur. No rubs thrills or heaves Abdomen: soft, nontender; no hepatosplenomehaly, BS+; abdominal aorta nontender and not dilated by palpation. Back: no CVA tenderness Pulses 2+ Extremities: no clubbing cyanosis or edema, Homan's sign negative  Neurologic: grossly nonfocal; cranial nerves grossly normal. Psychologic: normal affect and mood.   05/03/2015 ECG (independently read by me): Sinus bradycardia 54 bpm with PVC.  Inferior and lateral Q waves concordant with inferior lateral MI  ECG (independently read by me): Sinus bradycardia 55 bpm.  Inferior Q waves compatible with old inferior MI.  No significant ST segment  changes.  January 2015 ECG (independently read by me): Normal sinus rhythm at 75 beats per minute. Old inferior infarction with inferior Q waves. No other ST-T changes  LABS:  BMP Latest Ref Rng 04/30/2015 04/12/2015 04/08/2015  Glucose 65 - 99 mg/dL 152(H) 110(H) 93  BUN 7 - 25 mg/dL '17 24 14  '$ Creatinine 0.70 - 1.25 mg/dL 1.01 1.01 0.92  Sodium 135 - 146 mmol/L 142 143 141  Potassium 3.5 - 5.3 mmol/L 4.6 4.7 4.6  Chloride 98 - 110 mmol/L 103 104 106  CO2 20 - 31 mmol/L 33(H) 30 28  Calcium 8.6 - 10.3 mg/dL 9.4 10.2 9.0      Component Value Date/Time   PROT 6.1 04/08/2015 0911   ALBUMIN 3.7 04/08/2015 0911   AST 19 04/08/2015 0911   ALT 22 04/08/2015 0911   ALKPHOS 79 04/08/2015 0911   BILITOT 1.1 04/08/2015 0911   BILIDIR 0.1 01/09/2007 1511   IBILI 0.5 01/09/2007 1511    CBC Latest Ref Rng 04/08/2015 12/25/2013 06/30/2013  WBC 4.0 - 10.5 K/uL 6.5 7.9 12.0(H)  Hemoglobin 13.0 - 17.0 g/dL 16.3 16.9 17.4(H)  Hematocrit 39.0 - 52.0 % 48.4 49.7 50.0  Platelets 150 - 400 K/uL 164 172 186   Lab Results  Component Value Date   MCV 91.5 04/08/2015   MCV 88.9 12/25/2013   MCV 88.5 06/30/2013   Lab Results  Component Value Date   TSH 0.397 06/30/2013    BNP No results found for: PROBNP  Lipid Panel     Component Value Date/Time   CHOL 93* 04/08/2015 0911   TRIG 56 04/08/2015 0911   HDL 26* 04/08/2015 0911   CHOLHDL 3.6 04/08/2015 0911   VLDL 11 04/08/2015 0911   LDLCALC 56 04/08/2015 0911     RADIOLOGY: No results found.    ASSESSMENT AND PLAN:  Mr. Dempster is a 64 year-old Caucasian male who has CAD and has undergone prior extensive stenting to his RCA and also stenting to his circumflex vessel. An echo Doppler study in 2013 showed ejection fraction of 45-50%. There was severe inferior, inferolateral and inferoseptal hypokinesis in the basal inferolateral wall segments consistent with scar.  His last cardiac catheterization was done but by [name] in  September 2013.  I reviewed his most recent echo Doppler study as well as his nuclear perfusion study.  His nuclear perfusion study shows a large area of inferior scar without associated ischemia.  The ejection fraction on the nuclear study is reduced at 31%, making the scan, a high-risk study.  The patient remains relatively asymptomatic presently.  His echo Doppler study done the same day shows discordant dated with an EF of 50%, but also with inferior hypokinesis.  I suspect his true ejection fraction is somewhere in between.  His echo Doppler study is not significantly changed from previously, nor is his nuclear study.  Presently, he is doing well on the increased dose of Lotensin and the reduced dose of Lasix and I will continue these as prescribed.  His lipid studies are excellent on Crestor 20 mg, which he is tolerating without myalgias.  He has GERD and this is well-controlled with Protonix.  He is using CPAP for his obstructive sleep apnea and admits to 100% compliance.  As long as he remains stable, I will see him in 6 months for reevaluation or sooner if problems arise.  Time spent 25 minutes  Troy Sine, MD, [facility]  05/20/2015 1:41 PM

## 2015-05-24 ENCOUNTER — Ambulatory Visit (INDEPENDENT_AMBULATORY_CARE_PROVIDER_SITE_OTHER): Payer: Medicare Other | Admitting: Family Medicine

## 2015-05-24 ENCOUNTER — Encounter: Payer: Self-pay | Admitting: Family Medicine

## 2015-05-24 VITALS — BP 100/62 | HR 76 | Temp 98.0°F | Resp 18 | Ht 72.0 in | Wt 222.0 lb

## 2015-05-24 DIAGNOSIS — I25810 Atherosclerosis of coronary artery bypass graft(s) without angina pectoris: Secondary | ICD-10-CM | POA: Diagnosis not present

## 2015-05-24 DIAGNOSIS — D485 Neoplasm of uncertain behavior of skin: Secondary | ICD-10-CM

## 2015-05-24 NOTE — Progress Notes (Signed)
Subjective:    Patient ID: John [name], male    DOB: [birth year], 64 y.o.   MRN: [id number]  HPI 3 lesions on right arm,  1) 3 mm red scaly macule on biceps 2) 4 mm red scaly macule on forearm 3) 5 mm violaceous scaly papule on dorsum of right hand  All have been there for several months. Past Medical History  Diagnosis Date  . Coronary artery disease   . Myocardial infarction ([place])     posterior MI  . Shortness of breath   . Sleep apnea     on CPAP; 04/28/2007 split-night - AHI during total sleep 44.43/hr and REM 72.56/hr  . Hypertension   . Hyperlipidemia   . Diabetes mellitus   . GERD (gastroesophageal reflux disease)   . Left foot drop     r/t past disk srugery - uses Kevlar brace  . History of nuclear stress test 04/04/2011    lexiscan; mod-large in size fixed inferolateral defect (scar); non-diagnostic for ischemia; low risk scan    Past Surgical History  Procedure Laterality Date  . Back surgery  1985  . Transthoracic echocardiogram  07/29/2010    EF 50=55%, mod inf wall hypokinesis & mild post wall hypokinesis; LA mild-mod dilated; mild mitral annular calcif & mild MR; mild TR & elevated RV systolic pressure; AV mildly sclerotic; mild aortic root dilatation   . Cardiac catheterization  2010    6 stents total  . Coronary angioplasty with stent placement  03/1994    angioplasty & stenting (non-DES) of circumflex/prox ramus intermedius  . Coronary angioplasty with stent placement  10/1994    large iliac [id number] stent to RCA  . Coronary angioplasty  09/1998    mid-distal RCA balloon dilatation, 4.5 & 5.0 stents   . Cardiac catheterization  01/2000    percutaneous transluminal coronary balloon angioplasty of mid RCA stenotic lesion  . Coronary angioplasty with stent placement  12/2002    4.21m stents x2 of RCA  . Coronary angioplasty with stent placement  01/2005    cutting balloon arthrectomy of distal RCA & Cypher DES 3.5x13; cutting balloon arthrectomy of mid RCA  with Cypher DES 3.5x18  . Cardiac catheterization  06/2006    no stenting; ischemic cardiomyopathy, EF 40-45%  . Coronary angioplasty with stent placement  11/2008    stenting of mid RCA with 4.0x122mdriver, non-DES  . Left heart catheterization with coronary angiogram N/A 02/27/2012    Procedure: LEFT HEART CATHETERIZATION WITH CORONARY ANGIOGRAM;  Surgeon: JoLorretta HarpMD;  [facility];  Service: Cardiovascular;  Laterality: N/A;   Current Outpatient Prescriptions on File Prior to Visit  Medication Sig Dispense Refill  . aspirin 81 MG tablet Take 81 mg by mouth daily.    . benazepril (LOTENSIN) 10 MG tablet Take 1 tablet (10 mg total) by mouth daily. 30 tablet 6  . busPIRone (BUSPAR) 10 MG tablet Take 1 tablet (10 mg total) by mouth daily. 30 tablet 0  . BYSTOLIC 10 MG tablet TAKE 1 TABLET BY MOUTH EVERY DAY 30 tablet 10  . clopidogrel (PLAVIX) 75 MG tablet TAKE 1 TABLET BY MOUTH EVERY DAY 30 tablet 7  . CRESTOR 20 MG tablet TAKE 1 TABLET (20 MG TOTAL) BY MOUTH AT BEDTIME. 30 tablet 7  . furosemide (LASIX) 20 MG tablet Take 1 tablet (20 mg total) by mouth daily. 30 tablet 6  . glucose blood (ONE TOUCH ULTRA TEST) test strip 1 each by Other route  3 (three) times daily. Use as instructed 100 each 11  . Insulin Detemir (LEVEMIR FLEXTOUCH) 100 UNIT/ML Pen Inject 26 Units into the skin at bedtime. ICD E11.65 15 mL 0  . Insulin Pen Needle (NOVOFINE) 32G X 6 MM MISC 1 each by Other route daily. 100 each 3  . montelukast (SINGULAIR) 10 MG tablet TAKE 1 TABLET BY MOUTH EVERY DAY 30 tablet 11  . nitroGLYCERIN (NITROSTAT) 0.4 MG SL tablet Place 1 tablet (0.4 mg total) under the tongue every 5 (five) minutes x 3 doses as needed for chest pain. 25 tablet 3  . pantoprazole (PROTONIX) 40 MG tablet Take 1 tablet (40 mg total) by mouth daily. 30 tablet 10  . UNABLE TO FIND CPAP therapy     No current facility-administered medications on file prior to visit.   Allergies  Allergen Reactions    . Fish Allergy Itching  . Fish Oil Itching and Rash   Social History   Social History  . Marital Status: Married    Spouse Name: N/A  . Number of Children: 3  . Years of Education: N/A   Occupational History  . 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 [employer]   Social History Main Topics  . Smoking status: Current Some Day Smoker -- 1.00 packs/day for 50 years    Types: Cigarettes  . Smokeless tobacco: Never Used  . Alcohol Use: Yes     Comment: occasionally  . Drug Use: No  . Sexual Activity: Yes   Other Topics Concern  . Not on file   Social History Narrative      Review of Systems  All other systems reviewed and are negative.      Objective:   Physical Exam  Constitutional: He appears well-developed and well-nourished.  Cardiovascular: Normal rate, regular rhythm and normal heart sounds.   Pulmonary/Chest: Effort normal and breath sounds normal.  Vitals reviewed. see description in HPI        Assessment & Plan:  Neoplasm of uncertain behavior of skin  All 3 lesions were treated with cryotherapy using liquid nitrogen for 30 secs each.  Return for excisional biopsy if persistent.

## 2015-05-28 ENCOUNTER — Other Ambulatory Visit: Payer: Self-pay | Admitting: Family Medicine

## 2015-05-28 NOTE — Telephone Encounter (Signed)
Medication refilled per protocol. 

## 2015-06-16 ENCOUNTER — Other Ambulatory Visit: Payer: Self-pay | Admitting: Cardiovascular Disease

## 2015-06-16 NOTE — Telephone Encounter (Signed)
Rx request sent to pharmacy.  

## 2015-06-24 ENCOUNTER — Ambulatory Visit: Payer: Medicare Other | Admitting: Podiatry

## 2015-06-24 DIAGNOSIS — B351 Tinea unguium: Secondary | ICD-10-CM

## 2015-06-24 NOTE — Progress Notes (Signed)
He presents today for his second laser therapy to the bilateral foot toenail plates.  Minimal changes in the nail plates yet.  Onychomycosis. Laser therapy performed again today without iatrogenic lesion will follow up with him in 3 months.

## 2015-06-25 ENCOUNTER — Ambulatory Visit: Payer: Medicare Other | Admitting: Cardiovascular Disease

## 2015-07-14 ENCOUNTER — Other Ambulatory Visit: Payer: Self-pay | Admitting: Cardiovascular Disease

## 2015-07-14 NOTE — Telephone Encounter (Signed)
Rx request sent to pharmacy.  

## 2015-07-20 DIAGNOSIS — L308 Other specified dermatitis: Secondary | ICD-10-CM | POA: Diagnosis not present

## 2015-07-20 DIAGNOSIS — D1801 Hemangioma of skin and subcutaneous tissue: Secondary | ICD-10-CM | POA: Diagnosis not present

## 2015-07-20 DIAGNOSIS — L812 Freckles: Secondary | ICD-10-CM | POA: Diagnosis not present

## 2015-07-20 DIAGNOSIS — L820 Inflamed seborrheic keratosis: Secondary | ICD-10-CM | POA: Diagnosis not present

## 2015-07-20 DIAGNOSIS — L570 Actinic keratosis: Secondary | ICD-10-CM | POA: Diagnosis not present

## 2015-07-20 DIAGNOSIS — Z85828 Personal history of other malignant neoplasm of skin: Secondary | ICD-10-CM | POA: Diagnosis not present

## 2015-07-20 DIAGNOSIS — D225 Melanocytic nevi of trunk: Secondary | ICD-10-CM | POA: Diagnosis not present

## 2015-07-20 DIAGNOSIS — L821 Other seborrheic keratosis: Secondary | ICD-10-CM | POA: Diagnosis not present

## 2015-07-22 DIAGNOSIS — E11319 Type 2 diabetes mellitus with unspecified diabetic retinopathy without macular edema: Secondary | ICD-10-CM | POA: Diagnosis not present

## 2015-08-11 ENCOUNTER — Other Ambulatory Visit: Payer: Self-pay | Admitting: Family Medicine

## 2015-08-11 ENCOUNTER — Other Ambulatory Visit: Payer: Self-pay | Admitting: Cardiovascular Disease

## 2015-08-11 NOTE — Telephone Encounter (Signed)
Rx(s) sent to pharmacy electronically.  

## 2015-09-14 DIAGNOSIS — L570 Actinic keratosis: Secondary | ICD-10-CM | POA: Diagnosis not present

## 2015-09-14 DIAGNOSIS — D225 Melanocytic nevi of trunk: Secondary | ICD-10-CM | POA: Diagnosis not present

## 2015-09-14 DIAGNOSIS — L821 Other seborrheic keratosis: Secondary | ICD-10-CM | POA: Diagnosis not present

## 2015-09-23 ENCOUNTER — Ambulatory Visit (INDEPENDENT_AMBULATORY_CARE_PROVIDER_SITE_OTHER): Payer: Medicare Other | Admitting: Podiatry

## 2015-09-23 ENCOUNTER — Encounter: Payer: Self-pay | Admitting: Podiatry

## 2015-09-23 DIAGNOSIS — B351 Tinea unguium: Secondary | ICD-10-CM

## 2015-09-23 NOTE — Progress Notes (Signed)
   Subjective:    Patient ID: John [name], male    DOB: [birth year], 65 y.o.   MRN: [id number]  HPI  Pt presents today for laser therapy # 3 to right 1st, 2nd, 28rd, left 1st, 5th  Review of Systems All other systems negative    Objective:   Physical Exam  Onychomycosis to nails bilateral with minimal clearing     Assessment & Plan:  Affected nails were debrided, laser therapy performed with all safety precautions in place, tolerated approximately 2000 pulses well, re-appointed in 3 months for final  laser treatment

## 2015-10-10 ENCOUNTER — Other Ambulatory Visit: Payer: Self-pay | Admitting: Cardiovascular Disease

## 2015-10-11 NOTE — Telephone Encounter (Signed)
Rx(s) sent to pharmacy electronically.  

## 2015-10-26 ENCOUNTER — Ambulatory Visit (INDEPENDENT_AMBULATORY_CARE_PROVIDER_SITE_OTHER): Payer: Medicare Other | Admitting: Cardiovascular Disease

## 2015-10-26 ENCOUNTER — Encounter: Payer: Self-pay | Admitting: Cardiovascular Disease

## 2015-10-26 VITALS — BP 124/79 | HR 51 | Ht 72.0 in | Wt 225.2 lb

## 2015-10-26 DIAGNOSIS — R5382 Chronic fatigue, unspecified: Secondary | ICD-10-CM | POA: Diagnosis not present

## 2015-10-26 DIAGNOSIS — E785 Hyperlipidemia, unspecified: Secondary | ICD-10-CM

## 2015-10-26 DIAGNOSIS — G4733 Obstructive sleep apnea (adult) (pediatric): Secondary | ICD-10-CM | POA: Diagnosis not present

## 2015-10-26 DIAGNOSIS — Z9989 Dependence on other enabling machines and devices: Secondary | ICD-10-CM

## 2015-10-26 DIAGNOSIS — I10 Essential (primary) hypertension: Secondary | ICD-10-CM | POA: Diagnosis not present

## 2015-10-26 DIAGNOSIS — R001 Bradycardia, unspecified: Secondary | ICD-10-CM

## 2015-10-26 MED ORDER — NEBIVOLOL HCL 10 MG PO TABS: 5.0000 mg | ORAL_TABLET | Freq: Every day | ORAL | Status: DC

## 2015-10-26 NOTE — Patient Instructions (Signed)
Your physician has recommended you make the following change in your medication:   The Bystolic has been cut down to 5 mg daily. ( 1/2 tablet daily)  Your physician wants you to follow-up in: 6 months or sooner if needed. You will receive a reminder letter in the mail two months in advance. If you don't receive a letter, please call our office to schedule the follow-up appointment.  If you need a refill on your cardiac medications before your next appointment, please call your pharmacy.

## 2015-10-27 ENCOUNTER — Encounter: Payer: Self-pay | Admitting: Cardiovascular Disease

## 2015-10-27 DIAGNOSIS — R001 Bradycardia, unspecified: Secondary | ICD-10-CM | POA: Insufficient documentation

## 2015-10-27 DIAGNOSIS — R5383 Other fatigue: Secondary | ICD-10-CM | POA: Insufficient documentation

## 2015-10-27 NOTE — Progress Notes (Signed)
Patient ID: John [name], male   DOB: 1951/03/07, 65 y.o.   MRN: [id number]     HPI:  John [name] is a 65 y.o. male who is a former patient of Dr. Rollene Fare. He presents for a 6 month cardiology followup evaluation.   John [name] has known CAD and underwent multiple percutaneous cardiac interventions with stenting to circumflex and RCA. His last cardiac catheterization was done by [name] in September 2013 showed his RCA stented patent but he had 40% narrowing in the very distal aspect. He has a history of hypertension, as well as long-standing tobacco use with mild polycythemia. I had seen him in a sleep clinic after he obtained a new CPAP machine.  He has severe sleep apnea with an AHI on his initial diagnostic study of 44 per hour overall and 72.6 per hour with REM sleep.  A download last year  showed excellent benefit from CPAP with an AHI of 3.1.  His initial stent to his RCA was in 1996 was a Cypher PS [id number] sent. In 1999 he underwent non-DES stenting to circumflex after he presented with a posterior MI. He had additional 4-5 stents placed in his RCA.  He has a history of prior back surgery and has had a left foot drop.  He has significant obstructive sleep apnea and continues to utilize CPAP therapy.  When I saw him last year despite CPAP therapy.  He noted fatigue and residual daytime sleepiness.  I did suggest the possibility of Nuvigil or Provigil if necessary.    Last year he was seen  by [name] complaints of  atypical chest pain.  There was some concern that this may be reflux and pantoprazole was discontinued and replaced with dexilant. However, it was felt that the patient may require another stress test since his last evaluation was in 2012.  He also had had recent issues with some weight gain and shortness of breath and his intermittent Lasix dose changed to 40 mg daily.  When I saw him, I scheduled him for follow-up echo Doppler study as well as nuclear perfusion  study.  These were done on the same day on 05/06/2015.  Nuclear study was interpreted as a high risk study due to an ejection fraction of 31%.  There was a large fixed inferior and inferoseptal wall defect suggestive of scar and evidence for inferoseptal akinesis.  There was no ischemia.   On his prior nuclear study the EF in October 2012 was 38%.  An echo Doppler study done the same day however, showed discordant data and his ejection fraction was 50-55%.  Hypokinesis of the inferior inferolateral wall was noted.  There was grade 1 diastolic dysfunction.  There was mild aortic root dilatation at 41 mm.  His left atrium was severely dilated.  His right atrium was severely dilated.  There was trivial TR.  Of note, his last echo Doppler study had shown an EF of 45-50%.  As I last saw him, he has felt well.  He specifically has been without recurrent chest pain.  However, he continues to have issues with fatigability.  He admits to using CPAP 100% of the time.  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  He is unaware of any palpitations.  He has lost hearing in his right ear.  He presents for reevaluation.  Past Medical History  Diagnosis Date  .  Coronary artery disease   . Myocardial infarction ([place])     posterior MI  . Shortness of breath   . Sleep apnea     on CPAP; 04/28/2007 split-night - AHI during total sleep 44.43/hr and REM 72.56/hr  . Hypertension   . Hyperlipidemia   . Diabetes mellitus   . GERD (gastroesophageal reflux disease)   . Left foot drop     r/t past disk srugery - uses Kevlar brace  . History of nuclear stress test 04/04/2011    lexiscan; mod-large in size fixed inferolateral defect (scar); non-diagnostic for ischemia; low risk scan     Past Surgical History  Procedure Laterality Date  . Back surgery  1985  . Transthoracic echocardiogram  07/29/2010    EF 50=55%, mod inf wall hypokinesis &  mild post wall hypokinesis; LA mild-mod dilated; mild mitral annular calcif & mild MR; mild TR & elevated RV systolic pressure; AV mildly sclerotic; mild aortic root dilatation   . Cardiac catheterization  2010    6 stents total  . Coronary angioplasty with stent placement  03/1994    angioplasty & stenting (non-DES) of circumflex/prox ramus intermedius  . Coronary angioplasty with stent placement  10/1994    large iliac [id number] stent to RCA  . Coronary angioplasty  09/1998    mid-distal RCA balloon dilatation, 4.5 & 5.0 stents   . Cardiac catheterization  01/2000    percutaneous transluminal coronary balloon angioplasty of mid RCA stenotic lesion  . Coronary angioplasty with stent placement  12/2002    4.13m stents x2 of RCA  . Coronary angioplasty with stent placement  01/2005    cutting balloon arthrectomy of distal RCA & Cypher DES 3.5x13; cutting balloon arthrectomy of mid RCA with Cypher DES 3.5x18  . Cardiac catheterization  06/2006    no stenting; ischemic cardiomyopathy, EF 40-45%  . Coronary angioplasty with stent placement  11/2008    stenting of mid RCA with 4.0x192mdriver, non-DES  . Left heart catheterization with coronary angiogram N/A 02/27/2012    Procedure: LEFT HEART CATHETERIZATION WITH CORONARY ANGIOGRAM;  Surgeon: JoLorretta HarpMD;  [facility];  Service: Cardiovascular;  Laterality: N/A;    Allergies  Allergen Reactions  . Fish Allergy Itching  . Omega-3 Fatty Acids Itching    hives  . Fish Oil Itching and Rash    Current Outpatient Prescriptions  Medication Sig Dispense Refill  . aspirin 81 MG tablet Take 81 mg by mouth daily.    . benazepril (LOTENSIN) 10 MG tablet Take 1 tablet (10 mg total) by mouth daily. 30 tablet 6  . busPIRone (BUSPAR) 10 MG tablet TAKE 1 TABLET (10 MG TOTAL) BY MOUTH DAILY. 30 tablet 4  . clopidogrel (PLAVIX) 75 MG tablet TAKE 1 TABLET BY MOUTH EVERY DAY 30 tablet 10  . glucose blood (ONE TOUCH ULTRA TEST) test strip 1 each  by Other route 3 (three) times daily. Use as instructed 100 each 11  . Insulin Pen Needle (NOVOFINE) 32G X 6 MM MISC 1 each by Other route daily. 100 each 3  . LEVEMIR FLEXTOUCH 100 UNIT/ML Pen INJECT 26 UNITS INTO THE SKIN AT BEDTIME. ICD E11.65 15 pen 3  . montelukast (SINGULAIR) 10 MG tablet TAKE 1 TABLET BY MOUTH EVERY DAY 30 tablet 11  . nebivolol (BYSTOLIC) 10 MG tablet Take 0.5 tablets (5 mg total) by mouth daily. 30 tablet 10  . nitroGLYCERIN (NITROSTAT) 0.4 MG SL tablet Place 1 tablet (0.4 mg total)  under the tongue every 5 (five) minutes x 3 doses as needed for chest pain. 25 tablet 3  . rosuvastatin (CRESTOR) 20 MG tablet TAKE 1 TABLET (20 MG TOTAL) BY MOUTH AT BEDTIME. 30 tablet 6   No current facility-administered medications for this visit.    Social History   Social History  . Marital Status: Married    Spouse Name: N/A  . Number of Children: 3  . Years of Education: N/A   Occupational History  . 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 [place]   Social History Main Topics  . Smoking status: Current Some Day Smoker -- 1.00 packs/day for 50 years    Types: Cigarettes  . Smokeless tobacco: Never Used  . Alcohol Use: 0.0 oz/week    0 Standard drinks or equivalent per week     Comment: occasionally  . Drug Use: No  . Sexual Activity: Yes   Other Topics Concern  . Not on file   Social History Narrative   Social history is known that he works as a Freight forwarder for [employer]. There is a long-standing tobacco history. He rarely drinks alcohol.  Family History  Problem Relation Age of Onset  . Heart attack Father    ROS General: Negative; No fevers, chills, or night sweats;Positive for fatigue  HEENT: Positive for loss of hearing in his right ear Pulmonary: Negative; No cough, wheezing, shortness of breath, hemoptysis Cardiovascular: see history of present illness GI: Negative; No nausea, vomiting, diarrhea, or abdominal pain GU: Negative; No dysuria, hematuria,  or difficulty voiding Musculoskeletal: Negative; no myalgias, joint pain, or weakness Hematologic/Oncology: Negative; no easy bruising, bleeding Endocrine: Negative; no heat/cold intolerance; no diabetes Neuro: Negative; no changes in balance, headaches Skin: Negative; No rashes or skin lesions Psychiatric: Negative; No behavioral problems, depression Sleep: Positive for obstructive sleep apnea.  Mild residual daytime sleepiness; no bruxism, restless legs, hypnogognic hallucinations, no cataplexy Other comprehensive 14 point system review is negative.   PE BP 124/79 mmHg  Pulse 51  Ht 6' (1.829 m)  Wt 225 lb 3.2 oz (102.15 kg)  BMI 30.54 kg/m2   Repeat blood pressure 118/70. Wt Readings from Last 3 Encounters:  10/26/15 225 lb 3.2 oz (102.15 kg)  05/24/15 222 lb (100.699 kg)  05/20/15 227 lb (102.967 kg)    General: Alert, oriented, no distress.  Skin: normal turgor, no rashes HEENT: Normocephalic, atraumatic. Pupils round and reactive; sclera anicteric;no lid lag. Extraocular muscles intact. Nose without nasal septal hypertrophy Mouth/Parynx benign; Mallinpatti scale 3 Neck: No JVD, no carotid bruits; normal carotid upstroke Lungs: clear to ausculatation and percussion; no wheezing or rales Chest wall: no tenderness to palpitation Heart: RRR, s1 s2 normal 1/6 systolic murmur. No rubs thrills or heaves Abdomen: soft, nontender; no hepatosplenomehaly, BS+; abdominal aorta nontender and not dilated by palpation. Back: no CVA tenderness Pulses 2+ Extremities: no clubbing cyanosis or edema, Homan's sign negative  Neurologic: grossly nonfocal; cranial nerves grossly normal. Psychologic: normal affect and mood.  ECG (independently read by me): Sinus bradycardia at 51 bpm.  Old inferior infarct pattern with prominent inferior lateral Q waves  05/03/2015 ECG (independently read by me): Sinus bradycardia 54 bpm with PVC.  Inferior and lateral Q waves concordant with inferior lateral  MI  ECG (independently read by me): Sinus bradycardia 55 bpm.  Inferior Q waves compatible with old inferior MI.  No significant ST segment changes.  January 2015 ECG (independently read by me): Normal sinus rhythm at 75  beats per minute. Old inferior infarction with inferior Q waves. No other ST-T changes  LABS:  BMP Latest Ref Rng 04/30/2015 04/12/2015 04/08/2015  Glucose 65 - 99 mg/dL 152(H) 110(H) 93  BUN 7 - 25 mg/dL _0 Creatinine 0.70 - 1.25 mg/dL 1.01 1.01 0.92  Sodium 135 - 146 mmol/L 142 143 141  Potassium 3.5 - 5.3 mmol/L 4.6 4.7 4.6  Chloride 98 - 110 mmol/L 103 104 106  CO2 20 - 31 mmol/L 33(H) 30 28  Calcium 8.6 - 10.3 mg/dL 9.4 10.2 9.0   Hepatic Function Latest Ref Rng 04/08/2015 06/04/2014 12/25/2013  Total Protein 6.1 - 8.1 g/dL 6.1 5.8(L) 5.8(L)  Albumin 3.6 - 5.1 g/dL 3.7 3.7 3.9  AST 10 - 35 U/L _1 ALT 9 - 46 U/L _2 Alk Phosphatase 40 - 115 U/L 79 64 60  Total Bilirubin 0.2 - 1.2 mg/dL 1.1 0.6 0.8    CBC Latest Ref Rng 04/08/2015 12/25/2013 06/30/2013  WBC 4.0 - 10.5 K/uL 6.5 7.9 12.0(H)  Hemoglobin 13.0 - 17.0 g/dL 16.3 16.9 17.4(H)  Hematocrit 39.0 - 52.0 % 48.4 49.7 50.0  Platelets 150 - 400 K/uL 164 172 186   Lab Results  Component Value Date   MCV 91.5 04/08/2015   MCV 88.9 12/25/2013   MCV 88.5 06/30/2013   Lab Results  Component Value Date   TSH 0.397 06/30/2013    BNP No results found for: PROBNP  Lipid Panel     Component Value Date/Time   CHOL 93* 04/08/2015 0911   TRIG 56 04/08/2015 0911   HDL 26* 04/08/2015 0911   CHOLHDL 3.6 04/08/2015 0911   VLDL 11 04/08/2015 0911   LDLCALC 56 04/08/2015 0911     RADIOLOGY: No results found.    ASSESSMENT AND PLAN: John [name] is a 65 year-old Caucasian male who has CAD and has undergone prior extensive stenting to his RCA and also stenting to his circumflex vesse dating back to 74.   An echo Doppler study in 2013 showed ejection fraction of 45-50%. There was severe  inferior, inferolateral and inferoseptal hypokinesis in the basal inferolateral wall segments consistent with scar.  His last cardiac catheterization by [name] was in September 2013. His most recent nuclear perfusion study shows a large area of inferior scar without associated ischemia.  The ejection fraction on the nuclear study is reduced at 31%, making the scan, a high-risk study.  The patient remains relatively asymptomatic presently.  His echo Doppler study done the same day shows discordant dated with an EF of 50%, but also with inferior hypokinesis.  I suspect his true ejection fraction is somewhere in between.  His echo Doppler study is not significantly changed from previously, nor is his nuclear study.  His blood pressure today is stable on Lotensin 10 mg in addition to Bystolic 10 mg.  However, he complains of significant fatigue and he is bradycardic with a heart rate of 51 bpm.  I am suggesting he reduce his Bystolic to 5 mg to see if this improves of symptoms.  He continues to be on dual antiplatelet therapy with his multiple stents. He has GERD and this is well-controlled with Protonix.  He is using CPAP for his obstructive sleep apnea and admits to 100% compliance.  He is tolerating Crestor 20 mg for hyperlipidemia and LDL and when last checked was excellent at 56.  As long as he remains stable, I will see him in 6  months for reevaluation or sooner if problems arise.  Prior to that office visit repeat laboratory will be done.  Time spent 25 minutes  Troy Sine, MD, [facility]  10/27/2015 4:37 PM

## 2015-11-09 ENCOUNTER — Other Ambulatory Visit: Payer: Self-pay

## 2015-11-09 MED ORDER — NEBIVOLOL HCL 5 MG PO TABS: 5.0000 mg | ORAL_TABLET | Freq: Every day | ORAL | Status: DC

## 2015-11-09 NOTE — Telephone Encounter (Signed)
Patient called requesting a refill on Bystolic; stated Dr Claiborne Billings decreased med dosage from '10mg'$  to '5mg'$ , ok per Dr Claiborne Billings and Mariann Laster to change dosage in chart to '5mg'$ . Med sent to pharmacy sent to pharmacy; patient notified

## 2015-11-10 ENCOUNTER — Other Ambulatory Visit: Payer: Self-pay | Admitting: Cardiovascular Disease

## 2015-12-01 ENCOUNTER — Encounter: Payer: Self-pay | Admitting: Family Medicine

## 2015-12-01 ENCOUNTER — Ambulatory Visit (INDEPENDENT_AMBULATORY_CARE_PROVIDER_SITE_OTHER): Payer: Medicare Other | Admitting: Family Medicine

## 2015-12-01 VITALS — BP 142/78 | HR 80 | Temp 98.3°F | Resp 16 | Ht 72.0 in | Wt 223.0 lb

## 2015-12-01 DIAGNOSIS — T24201A Burn of second degree of unspecified site of right lower limb, except ankle and foot, initial encounter: Secondary | ICD-10-CM | POA: Diagnosis not present

## 2015-12-01 NOTE — Patient Instructions (Signed)
Apply silvadene Use non stick bandage After blistered area healed you do not need to cover F/U [name] for diabetes/labs

## 2015-12-02 ENCOUNTER — Encounter: Payer: Self-pay | Admitting: Family Medicine
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
## 2015-12-08 ENCOUNTER — Other Ambulatory Visit: Payer: Self-pay | Admitting: Cardiovascular Disease

## 2015-12-23 DIAGNOSIS — H9041 Sensorineural hearing loss, unilateral, right ear, with unrestricted hearing on the contralateral side: Secondary | ICD-10-CM | POA: Diagnosis not present

## 2015-12-23 DIAGNOSIS — H9191 Unspecified hearing loss, right ear: Secondary | ICD-10-CM | POA: Diagnosis not present

## 2015-12-23 DIAGNOSIS — D333 Benign neoplasm of cranial nerves: Secondary | ICD-10-CM | POA: Diagnosis not present

## 2015-12-30 ENCOUNTER — Ambulatory Visit: Payer: Medicare Other

## 2015-12-30 DIAGNOSIS — B351 Tinea unguium: Secondary | ICD-10-CM

## 2015-12-30 NOTE — Progress Notes (Signed)
   Subjective:    Patient ID: John [name], male    DOB: 1951/04/26, 65 y.o.   MRN: [id number]  HPI  Pt presents today for laser therapy # 3 to right 1st, 2nd, 3rd, left 1st, 5th  Review of Systems All other systems negative    Objective:   Physical Exam  Onychomycosis to nails bilateral with minimal clearing in hallux nails (25%) , all other nails are cleared at 75% or more     Assessment & Plan:  Affected nails were debrided, laser therapy performed with all safety precautions in place, tolerated approximately 2000 pulses well, re-appointed in 3 months for re-evaluation

## 2016-01-02 ENCOUNTER — Other Ambulatory Visit: Payer: Self-pay | Admitting: Family Medicine

## 2016-01-07 ENCOUNTER — Ambulatory Visit (INDEPENDENT_AMBULATORY_CARE_PROVIDER_SITE_OTHER): Payer: Medicare Other | Admitting: Family Medicine

## 2016-01-07 ENCOUNTER — Encounter: Payer: Self-pay | Admitting: Family Medicine

## 2016-01-07 VITALS — BP 100/68 | HR 60 | Temp 97.8°F | Resp 14 | Ht 72.0 in | Wt 224.0 lb

## 2016-01-07 DIAGNOSIS — E119 Type 2 diabetes mellitus without complications: Secondary | ICD-10-CM

## 2016-01-07 DIAGNOSIS — I10 Essential (primary) hypertension: Secondary | ICD-10-CM

## 2016-01-07 DIAGNOSIS — Z23 Encounter for immunization: Secondary | ICD-10-CM

## 2016-01-07 DIAGNOSIS — Z125 Encounter for screening for malignant neoplasm of prostate: Secondary | ICD-10-CM | POA: Diagnosis not present

## 2016-01-07 DIAGNOSIS — Z794 Long term (current) use of insulin: Secondary | ICD-10-CM | POA: Diagnosis not present

## 2016-01-07 DIAGNOSIS — E785 Hyperlipidemia, unspecified: Secondary | ICD-10-CM

## 2016-01-07 LAB — COMPLETE METABOLIC PANEL WITH GFR
ALK PHOS: 65 U/L (ref 40–115)
ALT: 22 U/L (ref 9–46)
AST: 19 U/L (ref 10–35)
Albumin: 3.6 g/dL (ref 3.6–5.1)
BUN: 15 mg/dL (ref 7–25)
CALCIUM: 8.8 mg/dL (ref 8.6–10.3)
CHLORIDE: 104 mmol/L (ref 98–110)
CO2: 30 mmol/L (ref 20–31)
Creat: 0.99 mg/dL (ref 0.70–1.25)
GFR, Est Non African American: 78 mL/min (ref >=60)
Glucose, Bld: 98 mg/dL (ref 70–99)
POTASSIUM: 4.9 mmol/L (ref 3.5–5.3)
Sodium: 144 mmol/L (ref 135–146)
Total Bilirubin: 0.8 mg/dL (ref 0.2–1.2)
Total Protein: 5.8 g/dL — ABNORMAL LOW (ref 6.1–8.1)

## 2016-01-07 LAB — CBC WITH DIFFERENTIAL/PLATELET
BASOS ABS: 0 {cells}/uL (ref 0–200)
Basophils Relative: 0 %
EOS ABS: 279 {cells}/uL (ref 15–500)
Eosinophils Relative: 3 %
HEMATOCRIT: 49.1 % (ref 38.5–50.0)
Hemoglobin: 16.7 g/dL (ref 13.0–17.0)
LYMPHS PCT: 19 %
Lymphs Abs: 1767 cells/uL (ref 850–3900)
MCH: 31.2 pg (ref 27.0–33.0)
MCHC: 34 g/dL (ref 32.0–36.0)
MCV: 91.8 fL (ref 80.0–100.0)
MONO ABS: 465 {cells}/uL (ref 200–950)
MPV: 11 fL (ref 7.5–12.5)
Monocytes Relative: 5 %
NEUTROS PCT: 73 %
Neutro Abs: 6789 cells/uL (ref 1500–7800)
Platelets: 167 10*3/uL (ref 140–400)
RBC: 5.35 MIL/uL (ref 4.20–5.80)
RDW: 13.5 % (ref 11.0–15.0)
WBC: 9.3 10*3/uL (ref 3.8–10.8)

## 2016-01-07 LAB — LIPID PANEL
Cholesterol: 95 mg/dL — ABNORMAL LOW (ref 125–200)
HDL: 31 mg/dL — AB (ref >=40)
LDL Cholesterol: 47 mg/dL (ref <130)
TRIGLYCERIDES: 84 mg/dL (ref <150)
Total CHOL/HDL Ratio: 3.1 Ratio (ref <=5.0)
VLDL: 17 mg/dL (ref <30)

## 2016-01-07 LAB — HEMOGLOBIN A1C
HEMOGLOBIN A1C: 7.1 % — AB (ref <5.7)
MEAN PLASMA GLUCOSE: 157 mg/dL

## 2016-01-07 NOTE — Addendum Note (Signed)
Addended by: Shary Decamp B on: 01/07/2016 11:01 AM   Modules accepted: Orders

## 2016-01-07 NOTE — Progress Notes (Signed)
Subjective:    Patient ID: John [name], male    DOB: [birth year], 65 y.o.   MRN: [id number]  HPI  Patient is here today for follow-up of his medical conditions. He is still smoking. However he is interested in quitting. We spent 5 minutes discussing Chantix and strategies to help quit smoking. Past medical history significant for coronary artery disease. He denies any chest pain shortness of breath or dyspnea on exertion. He also has insulin-dependent diabetes mellitus. He denies any hypoglycemia, polyuria, polydipsia, or blurred vision. He is due today for hemoglobin A1c. Diabetic foot exam is performed today and is normal. Diabetic eye exam was performed 3 months ago. He is due for a PSA. His blood pressure today is well controlled at 100/68. He is due today for fasting lipid panel. Goal LDL cholesterol is less than 70 given his history of CAD. He is also due today for a PSA. He has had Prevnar 13 but he is due for Pneumovax 23. He declines HIV and hepatitis C screening Past Medical History  Diagnosis Date  . Coronary artery disease   . Myocardial infarction ([place])     posterior MI  . Shortness of breath   . Sleep apnea     on CPAP; 04/28/2007 split-night - AHI during total sleep 44.43/hr and REM 72.56/hr  . Hypertension   . Hyperlipidemia   . Diabetes mellitus   . GERD (gastroesophageal reflux disease)   . Left foot drop     r/t past disk srugery - uses Kevlar brace  . History of nuclear stress test 04/04/2011    lexiscan; mod-large in size fixed inferolateral defect (scar); non-diagnostic for ischemia; low risk scan    Past Surgical History  Procedure Laterality Date  . Back surgery  1985  . Transthoracic echocardiogram  07/29/2010    EF 50=55%, mod inf wall hypokinesis & mild post wall hypokinesis; LA mild-mod dilated; mild mitral annular calcif & mild MR; mild TR & elevated RV systolic pressure; AV mildly sclerotic; mild aortic root dilatation   . Cardiac catheterization  2010      6 stents total  . Coronary angioplasty with stent placement  03/1994    angioplasty & stenting (non-DES) of circumflex/prox ramus intermedius  . Coronary angioplasty with stent placement  10/1994    large iliac [id number] stent to RCA  . Coronary angioplasty  09/1998    mid-distal RCA balloon dilatation, 4.5 & 5.0 stents   . Cardiac catheterization  01/2000    percutaneous transluminal coronary balloon angioplasty of mid RCA stenotic lesion  . Coronary angioplasty with stent placement  12/2002    4.78m stents x2 of RCA  . Coronary angioplasty with stent placement  01/2005    cutting balloon arthrectomy of distal RCA & Cypher DES 3.5x13; cutting balloon arthrectomy of mid RCA with Cypher DES 3.5x18  . Cardiac catheterization  06/2006    no stenting; ischemic cardiomyopathy, EF 40-45%  . Coronary angioplasty with stent placement  11/2008    stenting of mid RCA with 4.0x152mdriver, non-DES  . Left heart catheterization with coronary angiogram N/A 02/27/2012    Procedure: LEFT HEART CATHETERIZATION WITH CORONARY ANGIOGRAM;  Surgeon: JoLorretta HarpMD;  [facility];  Service: Cardiovascular;  Laterality: N/A;   Current Outpatient Prescriptions on File Prior to Visit  Medication Sig Dispense Refill  . aspirin 81 MG tablet Take 81 mg by mouth daily.    . benazepril (LOTENSIN) 10 MG tablet TAKE 1 TABLET (  10 MG TOTAL) BY MOUTH DAILY. 30 tablet 11  . busPIRone (BUSPAR) 10 MG tablet TAKE 1 TABLET (10 MG TOTAL) BY MOUTH DAILY. 30 tablet 6  . clopidogrel (PLAVIX) 75 MG tablet TAKE 1 TABLET BY MOUTH EVERY DAY 30 tablet 10  . glucose blood (ONE TOUCH ULTRA TEST) test strip 1 each by Other route 3 (three) times daily. Use as instructed 100 each 11  . Insulin Pen Needle (NOVOFINE) 32G X 6 MM MISC 1 each by Other route daily. 100 each 3  . LEVEMIR FLEXTOUCH 100 UNIT/ML Pen INJECT 26 UNITS INTO THE SKIN AT BEDTIME. ICD E11.65 15 pen 3  . montelukast (SINGULAIR) 10 MG tablet TAKE 1 TABLET BY MOUTH  EVERY DAY 30 tablet 11  . nebivolol (BYSTOLIC) 5 MG tablet Take 1 tablet (5 mg total) by mouth daily. 30 tablet 10  . nitroGLYCERIN (NITROSTAT) 0.4 MG SL tablet Place 1 tablet (0.4 mg total) under the tongue every 5 (five) minutes x 3 doses as needed for chest pain. 25 tablet 3  . rosuvastatin (CRESTOR) 20 MG tablet TAKE 1 TABLET (20 MG TOTAL) BY MOUTH AT BEDTIME. 30 tablet 6   No current facility-administered medications on file prior to visit.   Allergies  Allergen Reactions  . Fish Allergy Itching  . Omega-3 Fatty Acids Itching    hives  . Fish Oil Itching and Rash   Social History   Social History  . Marital Status: Married    Spouse Name: N/A  . Number of Children: 3  . Years of Education: N/A   Occupational History  . 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 [employer]   Social History Main Topics  . Smoking status: Current Some Day Smoker -- 1.00 packs/day for 50 years    Types: Cigarettes  . Smokeless tobacco: Never Used  . Alcohol Use: 0.0 oz/week    0 Standard drinks or equivalent per week     Comment: occasionally  . Drug Use: No  . Sexual Activity: Yes   Other Topics Concern  . Not on file   Social History Narrative     Review of Systems  All other systems reviewed and are negative.      Objective:   Physical Exam  Constitutional: He appears well-developed and well-nourished. No distress.  Neck: Neck supple. No JVD present.  Cardiovascular: Normal rate, regular rhythm, normal heart sounds and intact distal pulses.  Exam reveals no gallop and no friction rub.   No murmur heard. Pulmonary/Chest: Effort normal and breath sounds normal. No respiratory distress. He has no wheezes. He has no rales.  Abdominal: Soft. Bowel sounds are normal. He exhibits no distension. There is no tenderness. There is no rebound and no guarding.  Musculoskeletal: He exhibits no edema.  Lymphadenopathy:    He has no cervical adenopathy.  Skin: He is not diaphoretic.  Vitals  reviewed.         Assessment & Plan:  Controlled type 2 diabetes mellitus without complication, with long-term current use of insulin ([place]) - Plan: CBC with Differential/Platelet, COMPLETE METABOLIC PANEL WITH GFR, Hemoglobin A1c, Lipid panel, Microalbumin, urine  Prostate cancer screening - Plan: PSA  HLD (hyperlipidemia)  Essential hypertension  Blood pressures acceptable. I will make no changes in blood pressure medication. I will check hemoglobin A1c. Goal hemoglobin A1c is less than 7. I will check a fasting lipid panel. Goal LDL cholesterol is less than 70. He received Pneumovax 23 today. I will check  a urine microalbumin. I will also screen for prostate cancer with a PSA while checking lab work. The remainder of his preventative care is up-to-date. Diabetic eye exam is up-to-date.

## 2016-01-08 LAB — PSA: PSA: 0.46 ng/mL (ref <=4.00)

## 2016-01-08 LAB — MICROALBUMIN, URINE: MICROALB UR: 0.8 mg/dL

## 2016-01-10 ENCOUNTER — Encounter: Payer: Self-pay | Admitting: Family Medicine

## 2016-01-20 ENCOUNTER — Ambulatory Visit (INDEPENDENT_AMBULATORY_CARE_PROVIDER_SITE_OTHER): Payer: Medicare Other | Admitting: Podiatry

## 2016-01-20 ENCOUNTER — Encounter: Payer: Self-pay | Admitting: Podiatry

## 2016-01-20 DIAGNOSIS — L603 Nail dystrophy: Secondary | ICD-10-CM | POA: Diagnosis not present

## 2016-01-20 NOTE — Progress Notes (Signed)
[name] presents today for follow-up of his onychomycosis. He states that I thought the nails lip have been a little better at this point. He states that the continuity of laser treatment was not the same with different assistants. He states that Janett Billow did much better and was more thorough.  Objective: Vital signs are stable he is alert and oriented 3 pulses are palpable bilateral. Minimal change in the nail plates distally however it does appear that he has clearing proximally to all of the nails.  Assessment onychomycosis after laser therapy.  Plan: I encouraged him to come back free of charge for another 5 laser therapies. These lasers will be performed by Janett Billow.

## 2016-02-08 ENCOUNTER — Ambulatory Visit (INDEPENDENT_AMBULATORY_CARE_PROVIDER_SITE_OTHER): Payer: Medicare Other

## 2016-02-08 DIAGNOSIS — B351 Tinea unguium: Secondary | ICD-10-CM

## 2016-02-08 DIAGNOSIS — L603 Nail dystrophy: Secondary | ICD-10-CM

## 2016-02-08 NOTE — Progress Notes (Signed)
   Subjective:    Patient ID: John [name], male    DOB: September 08, 1950, 65 y.o.   MRN: [id number]  HPI  Pt presents today for laser therapy # 3 to right 1st, 2nd, 3rd, left 1st, 5th  Review of Systems All other systems negative    Objective:   Physical Exam  Onychomycosis to nails bilateral with minimal clearing in hallux nails (30%) , all other nails are cleared at 75% or more     Assessment & Plan:  Affected nails were debrided, laser therapy performed to nails 1-5 bilateral with all safety precautions in place, tolerated approximately 2000 pulses well, re-appointed in 1 month for 2nd of 5 treatments.

## 2016-02-22 ENCOUNTER — Other Ambulatory Visit: Payer: Self-pay | Admitting: Family Medicine

## 2016-02-22 DIAGNOSIS — IMO0002 Reserved for concepts with insufficient information to code with codable children: Secondary | ICD-10-CM

## 2016-02-22 DIAGNOSIS — E1165 Type 2 diabetes mellitus with hyperglycemia: Secondary | ICD-10-CM

## 2016-03-07 ENCOUNTER — Ambulatory Visit (INDEPENDENT_AMBULATORY_CARE_PROVIDER_SITE_OTHER): Payer: Medicare Other

## 2016-03-07 DIAGNOSIS — L603 Nail dystrophy: Secondary | ICD-10-CM

## 2016-03-07 DIAGNOSIS — B351 Tinea unguium: Secondary | ICD-10-CM

## 2016-03-07 NOTE — Progress Notes (Signed)
   Subjective:    Patient ID: John [name], male    DOB: 06-Feb-1951, 65 y.o.   MRN: [id number]  HPI  Pt presents today for laser therapy  to right 1st, 2nd, 3rd, left 1st, 5th  Review of Systems All other systems negative    Objective:   Physical Exam  Onychomycosis to nails bilateral with minimal clearing in hallux nails (30%) , all other nails are cleared at 75% or more     Assessment & Plan:  Affected nails were debrided, laser therapy performed to nails 1-5 bilateral with all safety precautions in place, tolerated approximately 2000 pulses well, re-appointed in 1 month for 3rd of 5 treatments.

## 2016-03-14 DIAGNOSIS — L570 Actinic keratosis: Secondary | ICD-10-CM | POA: Diagnosis not present

## 2016-03-14 DIAGNOSIS — D235 Other benign neoplasm of skin of trunk: Secondary | ICD-10-CM | POA: Diagnosis not present

## 2016-03-29 ENCOUNTER — Ambulatory Visit (INDEPENDENT_AMBULATORY_CARE_PROVIDER_SITE_OTHER): Payer: Medicare Other | Admitting: Physician Assistant

## 2016-03-29 ENCOUNTER — Encounter: Payer: Self-pay | Admitting: Physician Assistant

## 2016-03-29 VITALS — BP 110/70 | HR 60 | Temp 98.2°F | Resp 16 | Wt 223.0 lb

## 2016-03-29 DIAGNOSIS — Z794 Long term (current) use of insulin: Secondary | ICD-10-CM

## 2016-03-29 DIAGNOSIS — E119 Type 2 diabetes mellitus without complications: Secondary | ICD-10-CM | POA: Diagnosis not present

## 2016-03-29 DIAGNOSIS — IMO0001 Reserved for inherently not codable concepts without codable children: Secondary | ICD-10-CM

## 2016-03-29 NOTE — Progress Notes (Signed)
Patient ID: WILFRID HYSER MRN: [id number], DOB: 1950/12/05, 65 y.o. Date of Encounter: 03/29/2016, 11:08 AM    Chief Complaint:  Chief Complaint  Patient presents with  . office visit    glucose is high 155 this morning     HPI: 65 y.o. year old white male presents with above.   Says that he has had "sugar" for 4 years and has been on 26 units of Levemir and this has been stable/ consistent / the same for a long time. Says that in April his labs were excellent. Says "that was the problem "I heard that everything was excellent so slacked off some" Says that recently things got out of whack. Says whenever his sugar starts going up he can tell because he feels more tired more sleepy and his mouth feels dry. Says he started noticing this and he started checking his blood sugar frequently again.   Says that when he was working part time he was very active around the house. Says that for the past 2 weeks he has "not been doing anything "says "everything is done. The grass doesn't need mowing. The house is clean."  Says that usually first thing in the morning his blood sugar will be 90-95. He will eat some breakfast and 2 hours later to be about 150-160. Says this morning fasting was 152. He then ate some cereal he says it was half as much as he usually would eat but it was honey nut Cheerios. Says that a couple hours later blood sugar was up to 312. Says that for the past 4 days it has been up some.  Says that he thinks he knows what to do but thought that he better come in so we would know about it" Says that "I think I'm supposed to increase the insulin by 2 units a day while I get my diet and exercise back in shape"  I asked if he has an area where he can walk daily since he has his projects completed. Says that he has a "New Step" and is going to start using that today get on a schedule of using that.     Home Meds:   Outpatient Medications Prior to Visit  Medication Sig  Dispense Refill  . aspirin 81 MG tablet Take 81 mg by mouth daily.    . benazepril (LOTENSIN) 10 MG tablet TAKE 1 TABLET (10 MG TOTAL) BY MOUTH DAILY. 30 tablet 11  . busPIRone (BUSPAR) 10 MG tablet TAKE 1 TABLET (10 MG TOTAL) BY MOUTH DAILY. 30 tablet 6  . clopidogrel (PLAVIX) 75 MG tablet TAKE 1 TABLET BY MOUTH EVERY DAY 30 tablet 10  . Insulin Pen Needle (NOVOFINE) 32G X 6 MM MISC 1 each by Other route daily. 100 each 3  . LEVEMIR FLEXTOUCH 100 UNIT/ML Pen INJECT 26 UNITS INTO THE SKIN AT BEDTIME. ICD E11.65 15 pen 3  . montelukast (SINGULAIR) 10 MG tablet TAKE 1 TABLET BY MOUTH EVERY DAY 30 tablet 11  . nebivolol (BYSTOLIC) 5 MG tablet Take 1 tablet (5 mg total) by mouth daily. 30 tablet 10  . nitroGLYCERIN (NITROSTAT) 0.4 MG SL tablet Place 1 tablet (0.4 mg total) under the tongue every 5 (five) minutes x 3 doses as needed for chest pain. 25 tablet 3  . ONE TOUCH ULTRA TEST test strip USE 1 STRIP 3 TIMES DAILY - USE AS INSTRUCTED 100 each 5  . rosuvastatin (CRESTOR) 20 MG tablet TAKE 1 TABLET (20 MG TOTAL)  BY MOUTH AT BEDTIME. 30 tablet 6   No facility-administered medications prior to visit.     Allergies:  Allergies  Allergen Reactions  . Fish Allergy Itching  . Omega-3 Fatty Acids Itching    hives  . Fish Oil Itching and Rash      Review of Systems: See HPI for pertinent ROS. All other ROS negative.    Physical Exam: Blood pressure 110/70, pulse 60, temperature 98.2 F (36.8 C), resp. rate 16, weight 223 lb (101.2 kg), SpO2 97 %., Body mass index is 30.24 kg/m. General:  WNWD WM. Appears in no acute distress. Neck: Supple. No thyromegaly. No lymphadenopathy. Lungs: Clear bilaterally to auscultation without wheezes, rales, or rhonchi. Breathing is unlabored. Heart: Regular rhythm. No murmurs, rubs, or gallops. Msk:  Strength and tone normal for age. Extremities/Skin: Warm and dry.  Neuro: Alert and oriented X 3. Moves all extremities spontaneously. Gait is normal.  CNII-XII grossly in tact. Psych:  Responds to questions appropriately with a normal affect.     ASSESSMENT AND PLAN:  65 y.o. year old male with   1. IDDM (insulin dependent diabetes mellitus) ([place]) I gave him a blood sugar log sheet and showed him how to document on it. He is to use the numbers on the right as the date. To the left of this he is to write down the number of insulin units he is giving He is to check fasting blood sugar every morning and then can check one other reading at some other times of day. He is to increase his insulin by 1 unit each day until fasting blood sugars get in the 100-120 range. He is going to start routine exercise using New Step today.  He is going to get back to being compliant with diabetic diet starting today. He will then be able to taper the insulin dose back down as the blood sugars come down. Call, follow-up if any concerns or questions.   Signed, [address] [place], [place], [facility] 03/29/2016 11:08 AM

## 2016-04-04 ENCOUNTER — Other Ambulatory Visit: Payer: Medicare Other

## 2016-04-10 ENCOUNTER — Ambulatory Visit (INDEPENDENT_AMBULATORY_CARE_PROVIDER_SITE_OTHER): Payer: Medicare Other

## 2016-04-10 DIAGNOSIS — B351 Tinea unguium: Secondary | ICD-10-CM

## 2016-04-10 DIAGNOSIS — L603 Nail dystrophy: Secondary | ICD-10-CM

## 2016-04-10 NOTE — Progress Notes (Signed)
   Subjective:    Patient ID: John [name], male    DOB: 1950-11-28, 65 y.o.   MRN: [id number]  HPI  Pt presents today for laser therapy  to right 1st, 2nd, 3rd, left 1st, 5th  Review of Systems All other systems negative    Objective:   Physical Exam  Onychomycosis to nails bilateral with minimal clearing in hallux nails (30%) , all other nails are cleared at 75% or more     Assessment & Plan:  Affected nails were debrided, laser therapy performed to nails 1-5 bilateral with all safety precautions in place, tolerated approximately 2000 pulses well, re-appointed in 1 month for 4th of 5 treatments.

## 2016-04-18 ENCOUNTER — Ambulatory Visit (INDEPENDENT_AMBULATORY_CARE_PROVIDER_SITE_OTHER): Payer: Medicare Other | Admitting: Family Medicine

## 2016-04-18 DIAGNOSIS — Z23 Encounter for immunization: Secondary | ICD-10-CM

## 2016-04-29 ENCOUNTER — Other Ambulatory Visit: Payer: Self-pay | Admitting: Cardiovascular Disease

## 2016-05-08 ENCOUNTER — Encounter: Payer: Self-pay | Admitting: Cardiovascular Disease

## 2016-05-09 ENCOUNTER — Ambulatory Visit (INDEPENDENT_AMBULATORY_CARE_PROVIDER_SITE_OTHER): Payer: Medicare Other | Admitting: Cardiovascular Disease

## 2016-05-09 ENCOUNTER — Encounter: Payer: Self-pay | Admitting: Cardiovascular Disease

## 2016-05-09 ENCOUNTER — Telehealth: Payer: Self-pay | Admitting: Cardiovascular Disease

## 2016-05-09 VITALS — BP 125/85 | HR 79 | Ht 72.0 in | Wt 228.4 lb

## 2016-05-09 DIAGNOSIS — E785 Hyperlipidemia, unspecified: Secondary | ICD-10-CM | POA: Diagnosis not present

## 2016-05-09 DIAGNOSIS — I10 Essential (primary) hypertension: Secondary | ICD-10-CM

## 2016-05-09 DIAGNOSIS — Z5181 Encounter for therapeutic drug level monitoring: Secondary | ICD-10-CM

## 2016-05-09 DIAGNOSIS — Z9989 Dependence on other enabling machines and devices: Secondary | ICD-10-CM

## 2016-05-09 DIAGNOSIS — R0602 Shortness of breath: Secondary | ICD-10-CM | POA: Diagnosis not present

## 2016-05-09 DIAGNOSIS — E782 Mixed hyperlipidemia: Secondary | ICD-10-CM

## 2016-05-09 DIAGNOSIS — I2510 Atherosclerotic heart disease of native coronary artery without angina pectoris: Secondary | ICD-10-CM

## 2016-05-09 DIAGNOSIS — R0609 Other forms of dyspnea: Secondary | ICD-10-CM

## 2016-05-09 DIAGNOSIS — I483 Typical atrial flutter: Secondary | ICD-10-CM

## 2016-05-09 DIAGNOSIS — I4892 Unspecified atrial flutter: Secondary | ICD-10-CM

## 2016-05-09 DIAGNOSIS — Z7901 Long term (current) use of anticoagulants: Secondary | ICD-10-CM

## 2016-05-09 DIAGNOSIS — R5382 Chronic fatigue, unspecified: Secondary | ICD-10-CM | POA: Diagnosis not present

## 2016-05-09 DIAGNOSIS — G4733 Obstructive sleep apnea (adult) (pediatric): Secondary | ICD-10-CM

## 2016-05-09 LAB — LIPID PANEL
CHOL/HDL RATIO: 3.7 ratio (ref <5.0)
Cholesterol: 106 mg/dL (ref <200)
HDL: 29 mg/dL — ABNORMAL LOW (ref >40)
LDL Cholesterol: 65 mg/dL (ref <100)
Triglycerides: 58 mg/dL (ref <150)
VLDL: 12 mg/dL (ref <30)

## 2016-05-09 LAB — COMPREHENSIVE METABOLIC PANEL
ALK PHOS: 64 U/L (ref 40–115)
ALT: 21 U/L (ref 9–46)
AST: 17 U/L (ref 10–35)
Albumin: 4.1 g/dL (ref 3.6–5.1)
BUN: 16 mg/dL (ref 7–25)
CO2: 24 mmol/L (ref 20–31)
CREATININE: 1.03 mg/dL (ref 0.70–1.25)
Calcium: 9.3 mg/dL (ref 8.6–10.3)
Chloride: 108 mmol/L (ref 98–110)
GLUCOSE: 122 mg/dL — AB (ref 65–99)
POTASSIUM: 4.9 mmol/L (ref 3.5–5.3)
SODIUM: 143 mmol/L (ref 135–146)
TOTAL PROTEIN: 6.2 g/dL (ref 6.1–8.1)
Total Bilirubin: 1 mg/dL (ref 0.2–1.2)

## 2016-05-09 LAB — CBC
HCT: 50.6 % — ABNORMAL HIGH (ref 38.5–50.0)
Hemoglobin: 17.1 g/dL (ref 13.2–17.1)
MCH: 31.1 pg (ref 27.0–33.0)
MCHC: 33.8 g/dL (ref 32.0–36.0)
MCV: 92 fL (ref 80.0–100.0)
MPV: 10.8 fL (ref 7.5–12.5)
PLATELETS: 171 10*3/uL (ref 140–400)
RBC: 5.5 MIL/uL (ref 4.20–5.80)
RDW: 13.4 % (ref 11.0–15.0)
WBC: 7.1 10*3/uL (ref 3.8–10.8)

## 2016-05-09 LAB — TSH: TSH: 0.68 m[IU]/L (ref 0.40–4.50)

## 2016-05-09 MED ORDER — APIXABAN 5 MG PO TABS: 5.0000 mg | ORAL_TABLET | Freq: Two times a day (BID) | ORAL | 0 refills | Status: DC

## 2016-05-09 MED ORDER — APIXABAN 5 MG PO TABS: 5.0000 mg | ORAL_TABLET | Freq: Two times a day (BID) | ORAL | 3 refills | Status: DC

## 2016-05-09 MED ORDER — NEBIVOLOL HCL 10 MG PO TABS
10.0000 mg | ORAL_TABLET | Freq: Every day | ORAL | 6 refills | Status: DC
Start: 2016-05-09 — End: 2016-07-20

## 2016-05-09 NOTE — Telephone Encounter (Signed)
New message  Pt is calling  New RX for Eliquis was not called in to pharm  [facility]/

## 2016-05-09 NOTE — Patient Instructions (Addendum)
Your physician has requested that you have an echocardiogram. Echocardiography is a painless test that uses sound waves to create images of your heart. It provides your doctor with information about the size and shape of your heart and how well your heart's chambers and valves are working. This procedure takes approximately one hour. There are no restrictions for this procedure.  Your physician recommends that you return for lab work TODAY  Your physician has recommended you make the following change in your medication:   1.) the Bystolic has been increased to 10 mg daily.  2.) start new prescription for Eliquis.5 mg  3.) STOP clopidogrel.  Your physician recommends that you schedule a follow-up appointment in: 3-4 weeks.

## 2016-05-09 NOTE — Telephone Encounter (Signed)
Rx has been sent to the pharmacy electronically. ° °

## 2016-05-10 ENCOUNTER — Ambulatory Visit (INDEPENDENT_AMBULATORY_CARE_PROVIDER_SITE_OTHER): Payer: Medicare Other

## 2016-05-10 DIAGNOSIS — B351 Tinea unguium: Secondary | ICD-10-CM

## 2016-05-10 LAB — BRAIN NATRIURETIC PEPTIDE: BRAIN NATRIURETIC PEPTIDE: 83.7 pg/mL (ref <100)

## 2016-05-10 NOTE — Progress Notes (Signed)
   Subjective:    Patient ID: John [name], male    DOB: [birth year], 65 y.o.   MRN: [id number]  HPI  Pt presents today for laser therapy  to right 1st, 2nd, 3rd, left 1st, 5th  Review of Systems All other systems negative    Objective:   Physical Exam  Onychomycosis to nails bilateral with minimal clearing in hallux nails (50%) , all other nails are cleared at 75% or more     Assessment & Plan:  Affected nails were debrided, laser therapy performed to nails 1-5 bilateral with all safety precautions in place, tolerated approximately 2000 pulses well, re-appointed in 1 month for 5th and final treatment

## 2016-05-14 NOTE — Progress Notes (Signed)
Patient ID: John [name], male   DOB: Feb 22, 1951, 65 y.o.   MRN: [id number]     HPI:  John [name] is a 65 y.o. male who is a former patient of Dr. Rollene Fare. He presents for a 6 month cardiology followup evaluation.   Mr. [name] has known CAD and underwent multiple percutaneous cardiac interventions with stenting to circumflex and RCA. His last cardiac catheterization was done by [name] in September 2013 showed his RCA stented patent but he had 40% narrowing in the very distal aspect. He has a history of hypertension, as well as long-standing tobacco use with mild polycythemia. I had seen him in a sleep clinic after he obtained a new CPAP machine.  He has severe sleep apnea with an AHI on his initial diagnostic study of 44 per hour overall and 72.6 per hour with REM sleep.  A download last year  showed excellent benefit from CPAP with an AHI of 3.1.  His initial stent to his RCA was in 1996 was a Cypher PS [id number] sent. In 1999 he underwent non-DES stenting to circumflex after he presented with a posterior MI. He had additional 4-5 stents placed in his RCA.  He has a history of prior back surgery and has had a left foot drop.  He has significant obstructive sleep apnea and continues to utilize CPAP therapy.  When I saw him in the past despite CPAP therapy he complained of fatigue and residual daytime sleepiness and I suggested the possibility of Nuvigil or Provigil if necessary.    Last year he was seen  by [name] complaints of  atypical chest pain.  There was some concern that this may be reflux and pantoprazole was discontinued and replaced with dexilant. However, it was felt that the patient may require another stress test since his last evaluation was in 2012.  He also had had recent issues with some weight gain and shortness of breath and his intermittent Lasix dose changed to 40 mg daily.  When I saw him, I scheduled him for follow-up echo Doppler study as well as nuclear  perfusion study.  These were done on the same day on 05/06/2015.  Nuclear study was interpreted as a high risk study due to an ejection fraction of 31%.  There was a large fixed inferior and inferoseptal wall defect suggestive of scar and evidence for inferoseptal akinesis.  There was no ischemia.   On his prior nuclear study the EF in October 2012 was 38%.  An echo Doppler study done the same day however, showed discordant data and his ejection fraction was 50-55%.  Hypokinesis of the inferior inferolateral wall was noted.  There was grade 1 diastolic dysfunction.  There was mild aortic root dilatation at 41 mm.  His left atrium was severely dilated.  His right atrium was severely dilated.  There was trivial TR.  Of note, his last echo Doppler study had shown an EF of 45-50%.  Since I last saw him, he states that he has been using CPAP.  However, for the past month he has noticed significant increased fatigue.  He was also found to have elevated sugars and his insulin was adjusted.  He admits to development of some trace ankle swelling, left greater than right.  He denies any episodes of chest pain.  He is unaware of spells of tachycardia.  He denies presyncope or syncope.  He presents for evaluation.   Past Medical History:  Diagnosis Date  . Coronary artery  disease   . Diabetes mellitus   . GERD (gastroesophageal reflux disease)   . History of nuclear stress test 04/04/2011   lexiscan; mod-large in size fixed inferolateral defect (scar); non-diagnostic for ischemia; low risk scan   . Hyperlipidemia   . Hypertension   . Left foot drop    r/t past disk srugery - uses Kevlar brace  . Myocardial infarction    posterior MI  . Shortness of breath   . Sleep apnea    on CPAP; 04/28/2007 split-night - AHI during total sleep 44.43/hr and REM 72.56/hr    Past Surgical History:  Procedure Laterality Date  . BACK SURGERY  1985  . CARDIAC CATHETERIZATION  2010   6 stents total  . CARDIAC  CATHETERIZATION  01/2000   percutaneous transluminal coronary balloon angioplasty of mid RCA stenotic lesion  . CARDIAC CATHETERIZATION  06/2006   no stenting; ischemic cardiomyopathy, EF 40-45%  . CORONARY ANGIOPLASTY  09/1998   mid-distal RCA balloon dilatation, 4.5 & 5.0 stents   . CORONARY ANGIOPLASTY WITH STENT PLACEMENT  03/1994   angioplasty & stenting (non-DES) of circumflex/prox ramus intermedius  . CORONARY ANGIOPLASTY WITH STENT PLACEMENT  10/1994   large iliac [id number] stent to RCA  . CORONARY ANGIOPLASTY WITH STENT PLACEMENT  12/2002   4.94m stents x2 of RCA  . CORONARY ANGIOPLASTY WITH STENT PLACEMENT  01/2005   cutting balloon arthrectomy of distal RCA & Cypher DES 3.5x13; cutting balloon arthrectomy of mid RCA with Cypher DES 3.5x18  . CORONARY ANGIOPLASTY WITH STENT PLACEMENT  11/2008   stenting of mid RCA with 4.0x171mdriver, non-DES  . LEFT HEART CATHETERIZATION WITH CORONARY ANGIOGRAM N/A 02/27/2012   Procedure: LEFT HEART CATHETERIZATION WITH CORONARY ANGIOGRAM;  Surgeon: JoLorretta HarpMD;  [facility];  Service: Cardiovascular;  Laterality: N/A;  . TRANSTHORACIC ECHOCARDIOGRAM  07/29/2010   EF 50=55%, mod inf wall hypokinesis & mild post wall hypokinesis; LA mild-mod dilated; mild mitral annular calcif & mild MR; mild TR & elevated RV systolic pressure; AV mildly sclerotic; mild aortic root dilatation     Allergies  Allergen Reactions  . Fish Allergy Itching  . Omega-3 Fatty Acids Itching    hives  . Fish Oil Itching and Rash    Current Outpatient Prescriptions  Medication Sig Dispense Refill  . aspirin 81 MG tablet Take 81 mg by mouth daily.    . benazepril (LOTENSIN) 10 MG tablet TAKE 1 TABLET (10 MG TOTAL) BY MOUTH DAILY. 30 tablet 11  . busPIRone (BUSPAR) 10 MG tablet TAKE 1 TABLET (10 MG TOTAL) BY MOUTH DAILY. 30 tablet 6  . Insulin Pen Needle (NOVOFINE) 32G X 6 MM MISC 1 each by Other route daily. 100 each 3  . LEVEMIR FLEXTOUCH 100 UNIT/ML Pen  INJECT 26 UNITS INTO THE SKIN AT BEDTIME. ICD E11.65 15 pen 3  . montelukast (SINGULAIR) 10 MG tablet TAKE 1 TABLET BY MOUTH EVERY DAY 30 tablet 11  . ONE TOUCH ULTRA TEST test strip USE 1 STRIP 3 TIMES DAILY - USE AS INSTRUCTED 100 each 5  . rosuvastatin (CRESTOR) 20 MG tablet TAKE 1 TABLET (20 MG TOTAL) BY MOUTH AT BEDTIME. 30 tablet 6  . apixaban (ELIQUIS) 5 MG TABS tablet Take 1 tablet (5 mg total) by mouth 2 (two) times daily. 60 tablet 3  . nebivolol (BYSTOLIC) 10 MG tablet Take 1 tablet (10 mg total) by mouth daily. 30 tablet 6  . nitroGLYCERIN (NITROSTAT) 0.4 MG SL tablet Place 1  tablet (0.4 mg total) under the tongue every 5 (five) minutes x 3 doses as needed for chest pain. 25 tablet 3   No current facility-administered medications for this visit.     Social History   Social History  . Marital status: Married    Spouse name: N/A  . Number of children: 3  . Years of education: N/A   Occupational History  . 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 [place]   Social History Main Topics  . Smoking status: Current Some Day Smoker    Packs/day: 1.00    Years: 50.00    Types: Cigarettes  . Smokeless tobacco: Never Used  . Alcohol use 0.0 oz/week     Comment: occasionally  . Drug use: No  . Sexual activity: Yes   Other Topics Concern  . Not on file   Social History Narrative  . No narrative on file   Social history is known that he works as a Freight forwarder for [employer]. There is a long-standing tobacco history. He rarely drinks alcohol.  Family History  Problem Relation Age of Onset  . Heart attack Father    ROS General: Negative; No fevers, chills, or night sweats; Positive for fatigue  HEENT: Positive for loss of hearing in his right ear Pulmonary: Negative; No cough, wheezing, shortness of breath, hemoptysis Cardiovascular: see history of present illness GI: Negative; No nausea, vomiting, diarrhea, or abdominal pain GU: Negative; No dysuria, hematuria, or  difficulty voiding Musculoskeletal: Negative; no myalgias, joint pain, or weakness Hematologic/Oncology: Negative; no easy bruising, bleeding Endocrine: Negative; no heat/cold intolerance; no diabetes Neuro: Negative; no changes in balance, headaches Skin: Negative; No rashes or skin lesions Psychiatric: Negative; No behavioral problems, depression Sleep: Positive for obstructive sleep apnea.  Mild residual daytime sleepiness; no bruxism, restless legs, hypnogognic hallucinations, no cataplexy Other comprehensive 14 point system review is negative.   PE BP 125/85   Pulse 79   Ht 6' (1.829 m)   Wt 228 lb 6.4 oz (103.6 kg)   BMI 30.98 kg/m    Repeat blood pressure 124/78.  Wt Readings from Last 3 Encounters:  05/09/16 228 lb 6.4 oz (103.6 kg)  03/29/16 223 lb (101.2 kg)  01/07/16 224 lb (101.6 kg)    General: Alert, oriented, no distress.  Skin: normal turgor, no rashes HEENT: Normocephalic, atraumatic. Pupils round and reactive; sclera anicteric;no lid lag. Extraocular muscles intact. Nose without nasal septal hypertrophy Mouth/Parynx benign; Mallinpatti scale 3 Neck: No JVD, no carotid bruits; normal carotid upstroke Lungs: clear to ausculatation and percussion; no wheezing or rales Chest wall: no tenderness to palpitation Heart: Irregularly irregular rhythm with a controlled ventricular rate in the 70s., s1 s2 normal 1/6 systolic murmur. No rubs thrills or heaves Abdomen: soft, nontender; no hepatosplenomehaly, BS+; abdominal aorta nontender and not dilated by palpation. Back: no CVA tenderness Pulses 2+ Extremities: no clubbing cyanosis or edema, Homan's sign negative  Neurologic: grossly nonfocal; cranial nerves grossly normal. Psychologic: normal affect and mood.  ECG (independently read by me): Atrial flutter with variable block, ventricular rate at 74.  LVH by voltage criteria in aVL.  Inferior Q waves concordant with prior MI.  May 2017 ECG (independently read by  me): Sinus bradycardia at 51 bpm.  Old inferior infarct pattern with prominent inferior lateral Q waves  05/03/2015 ECG (independently read by me): Sinus bradycardia 54 bpm with PVC.  Inferior and lateral Q waves concordant with inferior lateral MI  ECG (independently read by  me): Sinus bradycardia 55 bpm.  Inferior Q waves compatible with old inferior MI.  No significant ST segment changes.  January 2015 ECG (independently read by me): Normal sinus rhythm at 75 beats per minute. Old inferior infarction with inferior Q waves. No other ST-T changes  LABS:  BMP Latest Ref Rng & Units 05/09/2016 01/07/2016 04/30/2015  Glucose 65 - 99 mg/dL 122(H) 98 152(H)  BUN 7 - 25 mg/dL _0 Creatinine 0.70 - 1.25 mg/dL 1.03 0.99 1.01  Sodium 135 - 146 mmol/L 143 144 142  Potassium 3.5 - 5.3 mmol/L 4.9 4.9 4.6  Chloride 98 - 110 mmol/L 108 104 103  CO2 20 - 31 mmol/L 24 30 33(H)  Calcium 8.6 - 10.3 mg/dL 9.3 8.8 9.4   Hepatic Function Latest Ref Rng & Units 05/09/2016 01/07/2016 04/08/2015  Total Protein 6.1 - 8.1 g/dL 6.2 5.8(L) 6.1  Albumin 3.6 - 5.1 g/dL 4.1 3.6 3.7  AST 10 - 35 U/L _1 ALT 9 - 46 U/L _2 Alk Phosphatase 40 - 115 U/L 64 65 79  Total Bilirubin 0.2 - 1.2 mg/dL 1.0 0.8 1.1  Bilirubin, Direct - - - -    CBC Latest Ref Rng & Units 05/09/2016 01/07/2016 04/08/2015  WBC 3.8 - 10.8 K/uL 7.1 9.3 6.5  Hemoglobin 13.2 - 17.1 g/dL 17.1 16.7 16.3  Hematocrit 38.5 - 50.0 % 50.6(H) 49.1 48.4  Platelets 140 - 400 K/uL 171 167 164   Lab Results  Component Value Date   MCV 92.0 05/09/2016   MCV 91.8 01/07/2016   MCV 91.5 04/08/2015   Lab Results  Component Value Date   TSH 0.68 05/09/2016    BNP No results found for: PROBNP  Lipid Panel     Component Value Date/Time   CHOL 106 05/09/2016 0934   TRIG 58 05/09/2016 0934   HDL 29 (L) 05/09/2016 0934   CHOLHDL 3.7 05/09/2016 0934   VLDL 12 05/09/2016 0934   LDLCALC 65 05/09/2016 0934     RADIOLOGY: No  results found.    ASSESSMENT AND PLAN: Mr. Fackler is a 65 year-old Caucasian male who has CAD and has undergone prior extensive stenting to his RCA and also stenting to his circumflex vesse dating back to 110.   An echo Doppler study in 2013 showed ejection fraction of 45-50%. There was severe inferior, inferolateral and inferoseptal hypokinesis in the basal inferolateral wall segments consistent with scar.  His last cardiac catheterization by [name] was in September 2013. His most recent nuclear perfusion study shows a large area of inferior scar without associated ischemia.  The ejection fraction on the nuclear study is reduced at 31%, making the scan, a high-risk study.  The patient remains relatively asymptomatic presently.  His echo Doppler study done the same day shows discordant dated with an EF of 50%, but also with inferior hypokinesis.  I suspect his true ejection fraction is somewhere in between.  His echo Doppler study is not significantly changed from previously, nor is his nuclear study.  Chief complaint today is that of progressive fatigue.  His ECG reveals that he is no longer in sinus rhythm but at present is in atrial flutter with variable block.  His ventricular rate is controlled.  His blood pressure today is controlled on benazapril 10 mg, Bystolic 5 mg.  He has been taking aspirin and Plavix for dual antiplatelet therapy.  He is on Crestor 20 mg for hyperlipidemia with target LDL  less than 70.  He continues to be on insulin for his diabetes mellitus and his dose was recently increased.  With his development of atrial flutter, I am recommending echo Doppler study.  I am increasing Bystolic to 10 mg.  I have recommended he discontinue Plavix and in its place he will start eliquis 5 mg twice a day dose anticoagulation.  His [id number] score is at least a 4.  With his CAD he will continue 81 mg aspirin.  I am checking a download of his CPAP unit makes certain his sleep apnea is being  optimally treated and adjustments to his CPAP pressure may be necessary if his AHI is elevated.  A complete set of fasting blood work will be obtained consisting of a comprehensive metabolic panel, TSH, magnesium level, lipid panel, CBC, and BNP.  I will see him in 3-4 weeks for reevaluation and further recommendations will be made at that time.  Time spent 35 minutes  Troy Sine, MD, [facility]  05/14/2016 10:05 AM

## 2016-06-02 ENCOUNTER — Ambulatory Visit (HOSPITAL_COMMUNITY): Payer: Medicare Other | Attending: Cardiology

## 2016-06-02 ENCOUNTER — Other Ambulatory Visit: Payer: Self-pay

## 2016-06-02 DIAGNOSIS — E785 Hyperlipidemia, unspecified: Secondary | ICD-10-CM | POA: Diagnosis not present

## 2016-06-02 DIAGNOSIS — I4892 Unspecified atrial flutter: Secondary | ICD-10-CM

## 2016-06-02 DIAGNOSIS — I071 Rheumatic tricuspid insufficiency: Secondary | ICD-10-CM | POA: Insufficient documentation

## 2016-06-02 DIAGNOSIS — I252 Old myocardial infarction: Secondary | ICD-10-CM | POA: Diagnosis not present

## 2016-06-02 DIAGNOSIS — G4733 Obstructive sleep apnea (adult) (pediatric): Secondary | ICD-10-CM | POA: Diagnosis not present

## 2016-06-02 DIAGNOSIS — Z720 Tobacco use: Secondary | ICD-10-CM | POA: Diagnosis not present

## 2016-06-02 DIAGNOSIS — Z8249 Family history of ischemic heart disease and other diseases of the circulatory system: Secondary | ICD-10-CM | POA: Diagnosis not present

## 2016-06-02 DIAGNOSIS — I10 Essential (primary) hypertension: Secondary | ICD-10-CM | POA: Diagnosis not present

## 2016-06-02 DIAGNOSIS — I2510 Atherosclerotic heart disease of native coronary artery without angina pectoris: Secondary | ICD-10-CM | POA: Diagnosis not present

## 2016-06-02 DIAGNOSIS — R002 Palpitations: Secondary | ICD-10-CM | POA: Insufficient documentation

## 2016-06-02 DIAGNOSIS — E119 Type 2 diabetes mellitus without complications: Secondary | ICD-10-CM | POA: Diagnosis not present

## 2016-06-02 DIAGNOSIS — I340 Nonrheumatic mitral (valve) insufficiency: Secondary | ICD-10-CM | POA: Diagnosis not present

## 2016-06-02 DIAGNOSIS — Z6831 Body mass index (BMI) 31.0-31.9, adult: Secondary | ICD-10-CM | POA: Diagnosis not present

## 2016-06-05 ENCOUNTER — Other Ambulatory Visit: Payer: Self-pay | Admitting: Cardiovascular Disease

## 2016-06-05 ENCOUNTER — Ambulatory Visit (INDEPENDENT_AMBULATORY_CARE_PROVIDER_SITE_OTHER): Payer: Medicare Other | Admitting: Cardiovascular Disease

## 2016-06-05 ENCOUNTER — Encounter: Payer: Self-pay | Admitting: Cardiovascular Disease

## 2016-06-05 VITALS — BP 114/80 | HR 78 | Ht 72.0 in | Wt 229.4 lb

## 2016-06-05 DIAGNOSIS — Z7901 Long term (current) use of anticoagulants: Secondary | ICD-10-CM | POA: Diagnosis not present

## 2016-06-05 DIAGNOSIS — E782 Mixed hyperlipidemia: Secondary | ICD-10-CM

## 2016-06-05 DIAGNOSIS — I2510 Atherosclerotic heart disease of native coronary artery without angina pectoris: Secondary | ICD-10-CM

## 2016-06-05 DIAGNOSIS — I483 Typical atrial flutter: Secondary | ICD-10-CM | POA: Diagnosis not present

## 2016-06-05 DIAGNOSIS — I10 Essential (primary) hypertension: Secondary | ICD-10-CM

## 2016-06-05 DIAGNOSIS — G4733 Obstructive sleep apnea (adult) (pediatric): Secondary | ICD-10-CM

## 2016-06-05 DIAGNOSIS — R5382 Chronic fatigue, unspecified: Secondary | ICD-10-CM

## 2016-06-05 DIAGNOSIS — Z9989 Dependence on other enabling machines and devices: Secondary | ICD-10-CM

## 2016-06-05 MED ORDER — AMIODARONE HCL 200 MG PO TABS: ORAL_TABLET | ORAL | 11 refills | Status: DC

## 2016-06-05 NOTE — Patient Instructions (Signed)
Your physician has recommended you make the following change in your medication:   1.) start new amiodarone prescription as directed on the bottle. This has been sent to your pharmacy.  Your physician recommends that you schedule a follow-up appointment in: 6 weeks with Dr Claiborne Billings.

## 2016-06-06 DIAGNOSIS — Z7901 Long term (current) use of anticoagulants: Secondary | ICD-10-CM | POA: Insufficient documentation

## 2016-06-06 NOTE — Progress Notes (Signed)
Patient ID: John [name], male   DOB: 09-Nov-1950, 65 y.o.   MRN: [id number]     HPI:  John [name] is a 65 y.o. male who is a former patient of Dr. Rollene Fare. He presents for a one month cardiology followup evaluation.   John [name] has known CAD and underwent multiple percutaneous cardiac interventions with stenting to circumflex and RCA. His last cardiac catheterization was done by [name] in September 2013 showed his RCA stented patent but he had 40% narrowing in the very distal aspect. He has a history of hypertension, as well as long-standing tobacco use with mild polycythemia. I had seen him in a sleep clinic after he obtained a new CPAP machine.  He has severe sleep apnea with an AHI on his initial diagnostic study of 44 per hour overall and 72.6 per hour with REM sleep.  A download last year  showed excellent benefit from CPAP with an AHI of 3.1.  His initial stent to his RCA was in 1996 was a Cypher PS [id number] sent. In 1999 he underwent non-DES stenting to circumflex after he presented with a posterior MI. He had additional 4-5 stents placed in his RCA.  He has a history of prior back surgery and has had a left foot drop.  He has significant obstructive sleep apnea and continues to utilize CPAP therapy.  When I saw him in the past despite CPAP therapy he complained of fatigue and residual daytime sleepiness and I suggested the possibility of Nuvigil or Provigil if necessary.    Last year he was seen  by [name] complaints of  atypical chest pain.  There was some concern that this may be reflux and pantoprazole was discontinued and replaced with dexilant. However, it was felt that the patient may require another stress test since his last evaluation was in 2012.  He also had had recent issues with some weight gain and shortness of breath and his intermittent Lasix dose changed to 40 mg daily.  When I saw him, I scheduled him for follow-up echo Doppler study as well as nuclear  perfusion study.  These were done on the same day on 05/06/2015.  Nuclear study was interpreted as a high risk study due to an ejection fraction of 31%.  There was a large fixed inferior and inferoseptal wall defect suggestive of scar and evidence for inferoseptal akinesis.  There was no ischemia.   On his prior nuclear study the EF in October 2012 was 38%.  An echo Doppler study done the same day however, showed discordant data and his ejection fraction was 50-55%.  Hypokinesis of the inferior inferolateral wall was noted.  There was grade 1 diastolic dysfunction.  There was mild aortic root dilatation at 41 mm.  His left atrium was severely dilated.  His right atrium was severely dilated.  There was trivial TR.  Of note, his last echo Doppler study had shown an EF of 45-50%.  When I last saw him, he stated that he has been using CPAP.  However, for the past month he has noticed significant increased fatigue.  He was also found to have elevated sugars and his insulin was adjusted.  He admits to development of some trace ankle swelling, left greater than right.  He denies any episodes of chest pain.  He is unaware of spells of tachycardia.  He denies presyncope or syncope.  During that evaluation, he was found to be in atrial flutter with variable block at 74  bpm of questionable duration.  At that time, I recommended that he discontinue Plavix and started him on eliquis 5 mg and further titrated his Bystolic to 10 mg.  His cha2ds2vascore is at least 4 and with his CAD he was advised to continue aspirin 81 mg.  He underwent an echo Doppler study on 06/02/2016 which showed an EF of 40-45% with moderate diffuse hypokinesis.  While they are, mild aortic sclerosis and increased atrial septal thickness consistent with lipomatous hypertrophy.  I obtained a download of his CPAP unit from 02/09/2016 through 05/08/2016.  He continues to meet Medicare compliance with 91% of days of usage and 79% with usage greater than 4  hours.  He was averaging 5 hours and 55 minutes per night.  AHI was 3.7.  However, he had a very large leak.  I have recommended that he get a new mask from his DME company.  He is aware of mild heart rate irregularity.  He presents for evaluation.  Past Medical History:  Diagnosis Date  . Coronary artery disease   . Diabetes mellitus   . GERD (gastroesophageal reflux disease)   . History of nuclear stress test 04/04/2011   lexiscan; mod-large in size fixed inferolateral defect (scar); non-diagnostic for ischemia; low risk scan   . Hyperlipidemia   . Hypertension   . Left foot drop    r/t past disk srugery - uses Kevlar brace  . Myocardial infarction    posterior MI  . Shortness of breath   . Sleep apnea    on CPAP; 04/28/2007 split-night - AHI during total sleep 44.43/hr and REM 72.56/hr    Past Surgical History:  Procedure Laterality Date  . BACK SURGERY  1985  . CARDIAC CATHETERIZATION  2010   6 stents total  . CARDIAC CATHETERIZATION  01/2000   percutaneous transluminal coronary balloon angioplasty of mid RCA stenotic lesion  . CARDIAC CATHETERIZATION  06/2006   no stenting; ischemic cardiomyopathy, EF 40-45%  . CORONARY ANGIOPLASTY  09/1998   mid-distal RCA balloon dilatation, 4.5 & 5.0 stents   . CORONARY ANGIOPLASTY WITH STENT PLACEMENT  03/1994   angioplasty & stenting (non-DES) of circumflex/prox ramus intermedius  . CORONARY ANGIOPLASTY WITH STENT PLACEMENT  10/1994   large iliac [id number] stent to RCA  . CORONARY ANGIOPLASTY WITH STENT PLACEMENT  12/2002   4.40m stents x2 of RCA  . CORONARY ANGIOPLASTY WITH STENT PLACEMENT  01/2005   cutting balloon arthrectomy of distal RCA & Cypher DES 3.5x13; cutting balloon arthrectomy of mid RCA with Cypher DES 3.5x18  . CORONARY ANGIOPLASTY WITH STENT PLACEMENT  11/2008   stenting of mid RCA with 4.0x168mdriver, non-DES  . LEFT HEART CATHETERIZATION WITH CORONARY ANGIOGRAM N/A 02/27/2012   Procedure: LEFT HEART CATHETERIZATION WITH  CORONARY ANGIOGRAM;  Surgeon: JoLorretta HarpMD;  [facility];  Service: Cardiovascular;  Laterality: N/A;  . TRANSTHORACIC ECHOCARDIOGRAM  07/29/2010   EF 50=55%, mod inf wall hypokinesis & mild post wall hypokinesis; LA mild-mod dilated; mild mitral annular calcif & mild MR; mild TR & elevated RV systolic pressure; AV mildly sclerotic; mild aortic root dilatation     Allergies  Allergen Reactions  . Fish Allergy Itching  . Omega-3 Fatty Acids Itching    hives  . Fish Oil Itching and Rash    Current Outpatient Prescriptions  Medication Sig Dispense Refill  . apixaban (ELIQUIS) 5 MG TABS tablet Take 1 tablet (5 mg total) by mouth 2 (two) times daily. 60 tablet 3  .  aspirin 81 MG tablet Take 81 mg by mouth daily.    . benazepril (LOTENSIN) 10 MG tablet TAKE 1 TABLET (10 MG TOTAL) BY MOUTH DAILY. 30 tablet 11  . busPIRone (BUSPAR) 10 MG tablet TAKE 1 TABLET (10 MG TOTAL) BY MOUTH DAILY. 30 tablet 6  . Insulin Pen Needle (NOVOFINE) 32G X 6 MM MISC 1 each by Other route daily. 100 each 3  . LEVEMIR FLEXTOUCH 100 UNIT/ML Pen INJECT 26 UNITS INTO THE SKIN AT BEDTIME. ICD E11.65 15 pen 3  . montelukast (SINGULAIR) 10 MG tablet TAKE 1 TABLET BY MOUTH EVERY DAY 30 tablet 11  . nebivolol (BYSTOLIC) 10 MG tablet Take 1 tablet (10 mg total) by mouth daily. 30 tablet 6  . nitroGLYCERIN (NITROSTAT) 0.4 MG SL tablet Place 0.4 mg under the tongue every 5 (five) minutes as needed for chest pain.    . ONE TOUCH ULTRA TEST test strip USE 1 STRIP 3 TIMES DAILY - USE AS INSTRUCTED 100 each 5  . rosuvastatin (CRESTOR) 20 MG tablet TAKE 1 TABLET (20 MG TOTAL) BY MOUTH AT BEDTIME. 30 tablet 6  . amiodarone (PACERONE) 200 MG tablet Take 1 tablet daily for 2 weeks then increase to 1 tablet twice a day. 60 tablet 11  . rosuvastatin (CRESTOR) 20 MG tablet TAKE 1 TABLET (20 MG TOTAL) BY MOUTH AT BEDTIME. 30 tablet 6   No current facility-administered medications for this visit.     Social History    Social History  . Marital status: Married    Spouse name: N/A  . Number of children: 3  . Years of education: N/A   Occupational History  . 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 [place]   Social History Main Topics  . Smoking status: Current Some Day Smoker    Packs/day: 1.00    Years: 50.00    Types: Cigarettes  . Smokeless tobacco: Never Used  . Alcohol use 0.0 oz/week     Comment: occasionally  . Drug use: No  . Sexual activity: Yes   Other Topics Concern  . Not on file   Social History Narrative  . No narrative on file   Social history is known that he works as a Freight forwarder for [employer]. There is a long-standing tobacco history. He rarely drinks alcohol.  Family History  Problem Relation Age of Onset  . Heart attack Father    ROS General: Negative; No fevers, chills, or night sweats; Positive for fatigue  HEENT: Positive for loss of hearing in his right ear Pulmonary: Negative; No cough, wheezing, shortness of breath, hemoptysis Cardiovascular: see history of present illness GI: Negative; No nausea, vomiting, diarrhea, or abdominal pain GU: Negative; No dysuria, hematuria, or difficulty voiding Musculoskeletal: Negative; no myalgias, joint pain, or weakness Hematologic/Oncology: Negative; no easy bruising, bleeding Endocrine: Negative; no heat/cold intolerance; no diabetes Neuro: Negative; no changes in balance, headaches Skin: Negative; No rashes or skin lesions Psychiatric: Negative; No behavioral problems, depression Sleep: Positive for obstructive sleep apnea.  Mild residual daytime sleepiness; no bruxism, restless legs, hypnogognic hallucinations, no cataplexy Other comprehensive 14 point system review is negative.   PE BP 114/80 (BP Location: Left Arm, Patient Position: Sitting, Cuff Size: Normal)   Pulse 78   Ht 6' (1.829 m)   Wt 229 lb 6 oz (104 kg)   BMI 31.11 kg/m    Repeat blood pressure 118/78  Wt Readings from Last 3 Encounters:   06/05/16 229 lb 6 oz (  104 kg)  05/09/16 228 lb 6.4 oz (103.6 kg)  03/29/16 223 lb (101.2 kg)    General: Alert, oriented, no distress.  Skin: normal turgor, no rashes HEENT: Normocephalic, atraumatic. Pupils round and reactive; sclera anicteric;no lid lag. Extraocular muscles intact. Nose without nasal septal hypertrophy Mouth/Parynx benign; Mallinpatti scale 3 Neck: No JVD, no carotid bruits; normal carotid upstroke Lungs: clear to ausculatation and percussion; no wheezing or rales Chest wall: no tenderness to palpitation Heart: Irregularly irregular rhythm with a controlled ventricular rate in the 70s., s1 s2 normal 1/6 systolic murmur. No rubs thrills or heaves Abdomen: soft, nontender; no hepatosplenomehaly, BS+; abdominal aorta nontender and not dilated by palpation. Back: no CVA tenderness Pulses 2+ Extremities: no clubbing cyanosis or edema, Homan's sign negative  Neurologic: grossly nonfocal; cranial nerves grossly normal. Psychologic: normal affect and mood.  ECG (independently read by me): Atrial flutter with a rate of 79 bpm with variable block.  QTc interval 467 ms.  November 2017 ECG (independently read by me): Atrial flutter with variable block, ventricular rate at 74.  LVH by voltage criteria in aVL.  Inferior Q waves concordant with prior MI.  May 2017 ECG (independently read by me): Sinus bradycardia at 51 bpm.  Old inferior infarct pattern with prominent inferior lateral Q waves  05/03/2015 ECG (independently read by me): Sinus bradycardia 54 bpm with PVC.  Inferior and lateral Q waves concordant with inferior lateral MI  ECG (independently read by me): Sinus bradycardia 55 bpm.  Inferior Q waves compatible with old inferior MI.  No significant ST segment changes.  January 2015 ECG (independently read by me): Normal sinus rhythm at 75 beats per minute. Old inferior infarction with inferior Q waves. No other ST-T changes  LABS:  BMP Latest Ref Rng & Units  05/09/2016 01/07/2016 04/30/2015  Glucose 65 - 99 mg/dL 122(H) 98 152(H)  BUN 7 - 25 mg/dL '16 15 17  ' Creatinine 0.70 - 1.25 mg/dL 1.03 0.99 1.01  Sodium 135 - 146 mmol/L 143 144 142  Potassium 3.5 - 5.3 mmol/L 4.9 4.9 4.6  Chloride 98 - 110 mmol/L 108 104 103  CO2 20 - 31 mmol/L 24 30 33(H)  Calcium 8.6 - 10.3 mg/dL 9.3 8.8 9.4   Hepatic Function Latest Ref Rng & Units 05/09/2016 01/07/2016 04/08/2015  Total Protein 6.1 - 8.1 g/dL 6.2 5.8(L) 6.1  Albumin 3.6 - 5.1 g/dL 4.1 3.6 3.7  AST 10 - 35 U/L '17 19 19  ' ALT 9 - 46 U/L '21 22 22  ' Alk Phosphatase 40 - 115 U/L 64 65 79  Total Bilirubin 0.2 - 1.2 mg/dL 1.0 0.8 1.1  Bilirubin, Direct - - - -    CBC Latest Ref Rng & Units 05/09/2016 01/07/2016 04/08/2015  WBC 3.8 - 10.8 K/uL 7.1 9.3 6.5  Hemoglobin 13.2 - 17.1 g/dL 17.1 16.7 16.3  Hematocrit 38.5 - 50.0 % 50.6(H) 49.1 48.4  Platelets 140 - 400 K/uL 171 167 164   Lab Results  Component Value Date   MCV 92.0 05/09/2016   MCV 91.8 01/07/2016   MCV 91.5 04/08/2015   Lab Results  Component Value Date   TSH 0.68 05/09/2016    BNP No results found for: PROBNP  Lipid Panel     Component Value Date/Time   CHOL 106 05/09/2016 0934   TRIG 58 05/09/2016 0934   HDL 29 (L) 05/09/2016 0934   CHOLHDL 3.7 05/09/2016 0934   VLDL 12 05/09/2016 0934   LDLCALC 65 05/09/2016 0934  RADIOLOGY: No results found.  IMPRESSION:  1. Typical atrial flutter ([place])   2. Essential hypertension   3. Coronary artery disease involving native coronary artery of native heart without angina pectoris   4. Anticoagulation adequate   5. OSA on CPAP   6. Mixed hyperlipidemia   7. Chronic fatigue     ASSESSMENT AND PLAN: John [name] is a 65 year-old Caucasian male who has CAD and has undergone prior extensive stenting to his RCA and also stenting to his circumflex vesse dating back to 16.  An echo Doppler study in 2013 showed ejection fraction of 45-50%. There was severe inferior, inferolateral  and inferoseptal hypokinesis in the basal inferolateral wall segments consistent with scar.  His last cardiac catheterization by [name] was in September 2013. His most recent nuclear perfusion study shows a large area of inferior scar without associated ischemia.  The ejection fraction on the nuclear study was reduced at 31%, making the scan, a high-risk study. His echo Doppler study done the same day showed discordant dated with an EF of 50%, but also with inferior hypokinesis.  I suspect his true ejection fraction is somewhere in between.  The past month, he has been found to be in atrial flutter with variable block, which undoubtedly has contributed to his fatigability.  His ejection fraction remains in the 40-45% range on his repeat echo of 06/02/2016.  His CPAP usage is adequate, although there is a considerable leak resulting from his mask and a new mask was recommended.  With his underlying coronary artery disease and LV dysfunction, I am initiating amiodarone therapy in attempt to pharmacologically convert him back to normal rhythm.  He will start 200 mg for 2 weeks and then titrate this to 200 mg twice a day.  He continues to be on eloquence for anticoagulation.  He continues to be on Bystolic 10 mg daily and if his pulse become slow on amiodarone Bystolic dose may need to be reduced.  I will see him in 4-6 weeks for reevaluation and if at that time he is still in atrial flutter.  plans will be made for cardioversion.  Time spent 25 minutes  Troy Sine, MD, [facility]  06/06/2016 7:41 PM

## 2016-06-07 ENCOUNTER — Ambulatory Visit: Payer: Medicare Other

## 2016-06-07 DIAGNOSIS — B351 Tinea unguium: Secondary | ICD-10-CM

## 2016-06-07 DIAGNOSIS — L603 Nail dystrophy: Secondary | ICD-10-CM

## 2016-06-27 ENCOUNTER — Ambulatory Visit (INDEPENDENT_AMBULATORY_CARE_PROVIDER_SITE_OTHER): Payer: Medicare Other

## 2016-06-27 ENCOUNTER — Ambulatory Visit (INDEPENDENT_AMBULATORY_CARE_PROVIDER_SITE_OTHER): Payer: Medicare Other | Admitting: Podiatry

## 2016-06-27 ENCOUNTER — Encounter: Payer: Self-pay | Admitting: Podiatry

## 2016-06-27 DIAGNOSIS — Q828 Other specified congenital malformations of skin: Secondary | ICD-10-CM | POA: Diagnosis not present

## 2016-06-27 DIAGNOSIS — M2042 Other hammer toe(s) (acquired), left foot: Secondary | ICD-10-CM

## 2016-06-27 DIAGNOSIS — M79675 Pain in left toe(s): Secondary | ICD-10-CM

## 2016-06-27 NOTE — Progress Notes (Signed)
He presents today for chief complaint of a painful fifth digit of the left foot states that the toes been red and sore and that she can hardly touch it. He states is very painful and I can hardly wear shoes.  Objective: Vital signs are stable he is alert and oriented 3 pulses remain palpable. He has some sensory loss per Semmes-Weinstein monofilament to the talus of the toes and some vibratory sensation to different toes. His toenails are looking much better prior to his laser. Fifth digit of the left foot does demonstrate mild erythema and a porokeratotic lesion overlying the lateral nail fold left. There is no signs of infection radiographs taken today 3 views left foot demonstrates no significant osseous lesion to the fifth digit left foot other than severe hallux 5 is deformity of the forefoot left.  Assessment: Diabetes mellitus with mild polyneuropathy. Porokeratosis fifth digit left foot non-complicated.  Plan: Treatment of reactive hyperkeratotic lesion.

## 2016-06-30 NOTE — Progress Notes (Signed)
   Subjective:    Patient ID: John [name], male    DOB: 01/05/1951, 66 y.o.   MRN: [id number]  HPI  Pt presents today for laser therapy  to right 1st, 2nd, 3rd, left 1st, 5th  Review of Systems All other systems negative    Objective:   Physical Exam  Onychomycosis to nails bilateral with minimal clearing in hallux nails (50%) , all other nails are cleared at 75% or more     Assessment & Plan:  Affected nails were debrided, laser therapy performed to nails 1-5 bilateral with all safety precautions in place, tolerated approximately 2000 pulses well, re-appointed as needed

## 2016-07-01 ENCOUNTER — Other Ambulatory Visit: Payer: Self-pay | Admitting: Cardiovascular Disease

## 2016-07-18 DIAGNOSIS — D225 Melanocytic nevi of trunk: Secondary | ICD-10-CM | POA: Diagnosis not present

## 2016-07-18 DIAGNOSIS — D0359 Melanoma in situ of other part of trunk: Secondary | ICD-10-CM | POA: Diagnosis not present

## 2016-07-18 DIAGNOSIS — L570 Actinic keratosis: Secondary | ICD-10-CM | POA: Diagnosis not present

## 2016-07-18 DIAGNOSIS — L821 Other seborrheic keratosis: Secondary | ICD-10-CM | POA: Diagnosis not present

## 2016-07-18 DIAGNOSIS — C4442 Squamous cell carcinoma of skin of scalp and neck: Secondary | ICD-10-CM | POA: Diagnosis not present

## 2016-07-18 DIAGNOSIS — D485 Neoplasm of uncertain behavior of skin: Secondary | ICD-10-CM | POA: Diagnosis not present

## 2016-07-20 ENCOUNTER — Encounter: Payer: Self-pay | Admitting: Cardiovascular Disease

## 2016-07-20 ENCOUNTER — Ambulatory Visit (INDEPENDENT_AMBULATORY_CARE_PROVIDER_SITE_OTHER): Payer: Medicare Other | Admitting: Cardiovascular Disease

## 2016-07-20 VITALS — BP 128/86 | HR 57 | Ht 72.0 in | Wt 224.0 lb

## 2016-07-20 DIAGNOSIS — Z7901 Long term (current) use of anticoagulants: Secondary | ICD-10-CM

## 2016-07-20 DIAGNOSIS — G4733 Obstructive sleep apnea (adult) (pediatric): Secondary | ICD-10-CM

## 2016-07-20 DIAGNOSIS — Z01818 Encounter for other preprocedural examination: Secondary | ICD-10-CM

## 2016-07-20 DIAGNOSIS — R5382 Chronic fatigue, unspecified: Secondary | ICD-10-CM

## 2016-07-20 DIAGNOSIS — I10 Essential (primary) hypertension: Secondary | ICD-10-CM | POA: Diagnosis not present

## 2016-07-20 DIAGNOSIS — E782 Mixed hyperlipidemia: Secondary | ICD-10-CM

## 2016-07-20 DIAGNOSIS — I481 Persistent atrial fibrillation: Secondary | ICD-10-CM

## 2016-07-20 DIAGNOSIS — I2510 Atherosclerotic heart disease of native coronary artery without angina pectoris: Secondary | ICD-10-CM

## 2016-07-20 DIAGNOSIS — I4819 Other persistent atrial fibrillation: Secondary | ICD-10-CM

## 2016-07-20 DIAGNOSIS — Z9989 Dependence on other enabling machines and devices: Secondary | ICD-10-CM

## 2016-07-20 MED ORDER — AMIODARONE HCL 200 MG PO TABS: 200.0000 mg | ORAL_TABLET | Freq: Two times a day (BID) | ORAL | 11 refills | Status: DC

## 2016-07-20 MED ORDER — NEBIVOLOL HCL 5 MG PO TABS: 5.0000 mg | ORAL_TABLET | Freq: Every day | ORAL | 6 refills | Status: DC

## 2016-07-20 MED ORDER — ROSUVASTATIN CALCIUM 20 MG PO TABS: 20.0000 mg | ORAL_TABLET | Freq: Every day | ORAL | 3 refills | Status: DC

## 2016-07-20 NOTE — Progress Notes (Signed)
Patient ID: John [name], male   DOB: 10-21-1950, 66 y.o.   MRN: [id number]     HPI:  John [name] is a 66 y.o. male who is a former patient of Dr. Rollene Fare. He presents for a one month cardiology followup evaluation.   John [name] has known CAD and underwent multiple percutaneous cardiac interventions with stenting to circumflex and RCA. His last cardiac catheterization was done by [name] in September 2013 showed his RCA stented patent but he had 40% narrowing in the very distal aspect. He has a history of hypertension, as well as long-standing tobacco use with mild polycythemia. I had seen him in a sleep clinic after he obtained a new CPAP machine.  He has severe sleep apnea with an AHI on his initial diagnostic study of 44 per hour overall and 72.6 per hour with REM sleep.  A download last year  showed excellent benefit from CPAP with an AHI of 3.1.  His initial stent to his RCA was in 1996 was a Cypher PS [id number] sent. In 1999 he underwent non-DES stenting to circumflex after he presented with a posterior MI. He had additional 4-5 stents placed in his RCA.  He has a history of prior back surgery and has had a left foot drop.  He has significant obstructive sleep apnea and continues to utilize CPAP therapy.  When I saw him in the past despite CPAP therapy he complained of fatigue and residual daytime sleepiness and I suggested the possibility of Nuvigil or Provigil if necessary.    Last year he was seen  by [name] complaints of  atypical chest pain.  There was some concern that this may be reflux and pantoprazole was discontinued and replaced with dexilant. However, it was felt that the patient may require another stress test since his last evaluation was in 2012.  He also had had recent issues with some weight gain and shortness of breath and his intermittent Lasix dose changed to 40 mg daily.  When I saw him, I scheduled him for follow-up echo Doppler study as well as nuclear  perfusion study.  These were done on the same day on 05/06/2015.  Nuclear study was interpreted as a high risk study due to an ejection fraction of 31%.  There was a large fixed inferior and inferoseptal wall defect suggestive of scar and evidence for inferoseptal akinesis.  There was no ischemia.   On his prior nuclear study the EF in October 2012 was 38%.  An echo Doppler study done the same day however, showed discordant data and his ejection fraction was 50-55%.  Hypokinesis of the inferior inferolateral wall was noted.  There was grade 1 diastolic dysfunction.  There was mild aortic root dilatation at 41 mm.  His left atrium was severely dilated.  His right atrium was severely dilated.  There was trivial TR.  Of note, his last echo Doppler study had shown an EF of 45-50%.  When I  saw him in November 2017  he stated that he has been using CPAP.  However, for the past month he has noticed significant increased fatigue.  He was also found to have elevated sugars and his insulin was adjusted.  He admits to development of some trace ankle swelling, left greater than right.  He denied any episodes of chest pain.  He was unaware of spells of tachycardia.  He denied presyncope or syncope.  During that evaluation, he was found to be in atrial flutter with  variable block at 74 bpm of questionable duration.  At that time, I recommended that he discontinue Plavix and started him on eliquis 5 mg and further titrated his Bystolic to 10 mg.  His cha2ds2vascore is at least 4 and with his CAD he was advised to continue aspirin 81 mg.  He underwent an echo Doppler study on 06/02/2016 which showed an EF of 40-45% with moderate diffuse hypokinesis.  While they are, mild aortic sclerosis and increased atrial septal thickness consistent with lipomatous hypertrophy.  I obtained a download of his CPAP unit from 02/09/2016 through 05/08/2016 and he was continuing to meet Medicare compliance with 91% of days of usage and 79% with  usage greater than 4 hours.  He was averaging 5 hours and 55 minutes per night.  AHI was 3.7.  However, he had a very large leak.  I have recommended that he get a new mask from his DME company.    When I last saw him, she hated amiodarone at 200 mg and recommended that he increase this after 2 weeks to 400 mg.  He states that he felt significantly better after proximally 7-10 days of taking amiodarone at the 20 mg dose that he never further titrated this to 400 mg daily.  He is still not received a new mask from his DME company, which is [facility].  He has been taking anticoagulation.  There is no bleeding.  He denies chest pressure.  He presents for reevaluation.  Past Medical History:  Diagnosis Date  . Coronary artery disease   . Diabetes mellitus   . GERD (gastroesophageal reflux disease)   . History of nuclear stress test 04/04/2011   lexiscan; mod-large in size fixed inferolateral defect (scar); non-diagnostic for ischemia; low risk scan   . Hyperlipidemia   . Hypertension   . Left foot drop    r/t past disk srugery - uses Kevlar brace  . Myocardial infarction    posterior MI  . Shortness of breath   . Sleep apnea    on CPAP; 04/28/2007 split-night - AHI during total sleep 44.43/hr and REM 72.56/hr    Past Surgical History:  Procedure Laterality Date  . BACK SURGERY  1985  . CARDIAC CATHETERIZATION  2010   6 stents total  . CARDIAC CATHETERIZATION  01/2000   percutaneous transluminal coronary balloon angioplasty of mid RCA stenotic lesion  . CARDIAC CATHETERIZATION  06/2006   no stenting; ischemic cardiomyopathy, EF 40-45%  . CORONARY ANGIOPLASTY  09/1998   mid-distal RCA balloon dilatation, 4.5 & 5.0 stents   . CORONARY ANGIOPLASTY WITH STENT PLACEMENT  03/1994   angioplasty & stenting (non-DES) of circumflex/prox ramus intermedius  . CORONARY ANGIOPLASTY WITH STENT PLACEMENT  10/1994   large iliac [id number] stent to RCA  . CORONARY ANGIOPLASTY WITH STENT PLACEMENT   12/2002   4.39m stents x2 of RCA  . CORONARY ANGIOPLASTY WITH STENT PLACEMENT  01/2005   cutting balloon arthrectomy of distal RCA & Cypher DES 3.5x13; cutting balloon arthrectomy of mid RCA with Cypher DES 3.5x18  . CORONARY ANGIOPLASTY WITH STENT PLACEMENT  11/2008   stenting of mid RCA with 4.0x126mdriver, non-DES  . LEFT HEART CATHETERIZATION WITH CORONARY ANGIOGRAM N/A 02/27/2012   Procedure: LEFT HEART CATHETERIZATION WITH CORONARY ANGIOGRAM;  Surgeon: JoLorretta HarpMD;  [facility];  Service: Cardiovascular;  Laterality: N/A;  . TRANSTHORACIC ECHOCARDIOGRAM  07/29/2010   EF 50=55%, mod inf wall hypokinesis & mild post wall hypokinesis; LA mild-mod  dilated; mild mitral annular calcif & mild MR; mild TR & elevated RV systolic pressure; AV mildly sclerotic; mild aortic root dilatation     Allergies  Allergen Reactions  . Fish Allergy Itching  . Omega-3 Fatty Acids Itching    hives  . Fish Oil Itching and Rash    Current Outpatient Prescriptions  Medication Sig Dispense Refill  . amiodarone (PACERONE) 200 MG tablet Take 1 tablet (200 mg total) by mouth 2 (two) times daily. 60 tablet 11  . apixaban (ELIQUIS) 5 MG TABS tablet Take 1 tablet (5 mg total) by mouth 2 (two) times daily. 60 tablet 3  . aspirin 81 MG tablet Take 81 mg by mouth daily.    . benazepril (LOTENSIN) 10 MG tablet TAKE 1 TABLET (10 MG TOTAL) BY MOUTH DAILY. 30 tablet 11  . busPIRone (BUSPAR) 10 MG tablet TAKE 1 TABLET (10 MG TOTAL) BY MOUTH DAILY. 30 tablet 6  . Insulin Pen Needle (NOVOFINE) 32G X 6 MM MISC 1 each by Other route daily. 100 each 3  . LEVEMIR FLEXTOUCH 100 UNIT/ML Pen INJECT 26 UNITS INTO THE SKIN AT BEDTIME. ICD E11.65 15 pen 3  . montelukast (SINGULAIR) 10 MG tablet TAKE 1 TABLET BY MOUTH EVERY DAY 30 tablet 11  . nitroGLYCERIN (NITROSTAT) 0.4 MG SL tablet Place 0.4 mg under the tongue every 5 (five) minutes as needed for chest pain.    . ONE TOUCH ULTRA TEST test strip USE 1 STRIP 3 TIMES  DAILY - USE AS INSTRUCTED 100 each 5  . nebivolol (BYSTOLIC) 5 MG tablet Take 1 tablet (5 mg total) by mouth daily. 30 tablet 6  . rosuvastatin (CRESTOR) 20 MG tablet Take 1 tablet (20 mg total) by mouth daily. 90 tablet 3   No current facility-administered medications for this visit.     Social History   Social History  . Marital status: Married    Spouse name: N/A  . Number of children: 3  . Years of education: N/A   Occupational History  . 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 [place]   Social History Main Topics  . Smoking status: Current Some Day Smoker    Packs/day: 1.00    Years: 50.00    Types: Cigarettes  . Smokeless tobacco: Never Used  . Alcohol use 0.0 oz/week     Comment: occasionally  . Drug use: No  . Sexual activity: Yes   Other Topics Concern  . Not on file   Social History Narrative  . No narrative on file   Social history is known that he works as a Freight forwarder for [employer]. There is a long-standing tobacco history. He rarely drinks alcohol.  Family History  Problem Relation Age of Onset  . Heart attack Father    ROS General: Negative; No fevers, chills, or night sweats; Positive for fatigue  HEENT: Positive for loss of hearing in his right ear Pulmonary: Negative; No cough, wheezing, shortness of breath, hemoptysis Cardiovascular: see history of present illness GI: Negative; No nausea, vomiting, diarrhea, or abdominal pain GU: Negative; No dysuria, hematuria, or difficulty voiding Musculoskeletal: Negative; no myalgias, joint pain, or weakness Hematologic/Oncology: Negative; no easy bruising, bleeding Endocrine: Negative; no heat/cold intolerance; no diabetes Neuro: Negative; no changes in balance, headaches Skin: Negative; No rashes or skin lesions Psychiatric: Negative; No behavioral problems, depression Sleep: Positive for obstructive sleep apnea.  Mild residual daytime sleepiness; no bruxism, restless legs, hypnogognic hallucinations,  no cataplexy Other comprehensive 14  point system review is negative.   PE BP 128/86   Pulse (!) 57   Ht 6' (1.829 m)   Wt 224 lb (101.6 kg)   BMI 30.38 kg/m     Wt Readings from Last 3 Encounters:  07/20/16 224 lb (101.6 kg)  06/05/16 229 lb 6 oz (104 kg)  05/09/16 228 lb 6.4 oz (103.6 kg)    General: Alert, oriented, no distress.  Skin: normal turgor, no rashes HEENT: Normocephalic, atraumatic. Pupils round and reactive; sclera anicteric;no lid lag. Extraocular muscles intact. Nose without nasal septal hypertrophy Mouth/Parynx benign; Mallinpatti scale 3 Neck: No JVD, no carotid bruits; normal carotid upstroke Lungs: clear to ausculatation and percussion; no wheezing or rales Chest wall: no tenderness to palpitation Heart: Irregularly irregular rhythm with a controlled ventricular rate in the 60s., s1 s2 normal 1/6 systolic murmur. No rubs thrills or heaves Abdomen: soft, nontender; no hepatosplenomehaly, BS+; abdominal aorta nontender and not dilated by palpation. Back: no CVA tenderness Pulses 2+ Extremities: no clubbing cyanosis or edema, Homan's sign negative  Neurologic: grossly nonfocal; cranial nerves grossly normal. Psychologic: normal affect and mood.  ECG (independently read by me): Atrial fibrillation at 57 bpm.  Inferior Q waves.  An small inferolateral Q waves.  QTc interval 418 ms.  06/05/2016 ECG (independently read by me): Atrial flutter with a rate of 79 bpm with variable block.  QTc interval 467 ms.  November 2017 ECG (independently read by me): Atrial flutter with variable block, ventricular rate at 74.  LVH by voltage criteria in aVL.  Inferior Q waves concordant with prior MI.  May 2017 ECG (independently read by me): Sinus bradycardia at 51 bpm.  Old inferior infarct pattern with prominent inferior lateral Q waves  05/03/2015 ECG (independently read by me): Sinus bradycardia 54 bpm with PVC.  Inferior and lateral Q waves concordant with inferior  lateral MI  ECG (independently read by me): Sinus bradycardia 55 bpm.  Inferior Q waves compatible with old inferior MI.  No significant ST segment changes.  January 2015 ECG (independently read by me): Normal sinus rhythm at 75 beats per minute. Old inferior infarction with inferior Q waves. No other ST-T changes  LABS:  BMP Latest Ref Rng & Units 05/09/2016 01/07/2016 04/30/2015  Glucose 65 - 99 mg/dL 122(H) 98 152(H)  BUN 7 - 25 mg/dL _0 Creatinine 0.70 - 1.25 mg/dL 1.03 0.99 1.01  Sodium 135 - 146 mmol/L 143 144 142  Potassium 3.5 - 5.3 mmol/L 4.9 4.9 4.6  Chloride 98 - 110 mmol/L 108 104 103  CO2 20 - 31 mmol/L 24 30 33(H)  Calcium 8.6 - 10.3 mg/dL 9.3 8.8 9.4   Hepatic Function Latest Ref Rng & Units 05/09/2016 01/07/2016 04/08/2015  Total Protein 6.1 - 8.1 g/dL 6.2 5.8(L) 6.1  Albumin 3.6 - 5.1 g/dL 4.1 3.6 3.7  AST 10 - 35 U/L _1 ALT 9 - 46 U/L _2 Alk Phosphatase 40 - 115 U/L 64 65 79  Total Bilirubin 0.2 - 1.2 mg/dL 1.0 0.8 1.1  Bilirubin, Direct - - - -    CBC Latest Ref Rng & Units 05/09/2016 01/07/2016 04/08/2015  WBC 3.8 - 10.8 K/uL 7.1 9.3 6.5  Hemoglobin 13.2 - 17.1 g/dL 17.1 16.7 16.3  Hematocrit 38.5 - 50.0 % 50.6(H) 49.1 48.4  Platelets 140 - 400 K/uL 171 167 164   Lab Results  Component Value Date   MCV 92.0 05/09/2016   MCV 91.8  01/07/2016   MCV 91.5 04/08/2015   Lab Results  Component Value Date   TSH 0.68 05/09/2016    BNP No results found for: PROBNP  Lipid Panel     Component Value Date/Time   CHOL 106 05/09/2016 0934   TRIG 58 05/09/2016 0934   HDL 29 (L) 05/09/2016 0934   CHOLHDL 3.7 05/09/2016 0934   VLDL 12 05/09/2016 0934   LDLCALC 65 05/09/2016 0934     RADIOLOGY: No results found.  IMPRESSION:  1. Persistent atrial fibrillation ([place])   2. Coronary artery disease involving native coronary artery of native heart without angina pectoris   3. Essential hypertension   4. Anticoagulation adequate   5.  Pre-op testing   6. Chronic fatigue   7. OSA on CPAP   8. Mixed hyperlipidemia     ASSESSMENT AND PLAN: John [name] is a 66 year-old Caucasian male who has CAD and has undergone prior extensive stenting to his RCA and also stenting to his circumflex vesse dating back to 21.  An echo Doppler study in 2013 showed ejection fraction of 45-50%. There was severe inferior, inferolateral and inferoseptal hypokinesis in the basal inferolateral wall segments consistent with scar.  His last cardiac catheterization by [name] was in September 2013. His most recent nuclear perfusion study shows a large area of inferior scar without associated ischemia.  The ejection fraction on the nuclear study was reduced at 31%, making the scan, a high-risk study. His echo Doppler study done the same day showed discordant dated with an EF of 50%, but also with inferior hypokinesis.  I suspect his true ejection fraction is somewhere in between.  When he was seen in November 2017 he was in in atrial flutter with variable block, which undoubtedly had contributed to his fatigability.  His ejection fraction remains in the 40-45% range on his repeat echo of 06/02/2016.  His CPAP usage is adequate, although there is a considerable leak resulting from his mask and a new mask was recommended.  With his underlying coronary artery disease and LV dysfunction, I stated amiodarone at 200 mg daily with plans to titrate to 200 mg twice a day.  He presents today after only still being on 200 mg daily.  I have suggested that he titrate this to 200 mg twice a day in a hope that perhaps this can lead to pharmacologic cardioversion.  I will reduce his Bystolic from 10 mg to 5 mg.   A new mask for CPAP was prescribed.  I will plan to perform a DC cardioversion late next week on 07/28/2016.  I discussed the risk, benefits of the procedure any agrees to undergo this.  I again reviewed laboratory from November 2017.  On Crestor 20 mg. LDL is excellent at  65.  His blood pressure today is controlled on been benazepril10 mg in addition to his nebivolol.  There is no bleeding on eliquis.  I will see him next week at his cardioversion.   ime spent 25 minutes  Troy Sine, MD, [facility]  07/22/2016 12:13 PM

## 2016-07-20 NOTE — Patient Instructions (Signed)
Your physician has recommended that you have a Cardioversion (DCCV). Electrical Cardioversion uses a jolt of electricity to your heart either through paddles or wired patches attached to your chest. This is a controlled, usually prescheduled, procedure. Defibrillation is done under light anesthesia in the hospital, and you usually go home the day of the procedure. This is done to get your heart back into a normal rhythm. You are not awake for the procedure. Please see the instruction sheet given to you today. This will be done on February 9th by Dr Claiborne Billings.  Your physician recommends that you return for lab work and Chest xray prior to the procedure.  Your physician has recommended you make the following change in your medication:   1.) take the amiodarone 200 mg twice a day.  2.) the bystolic has been decreased from 10 mg to 5 mg daily.

## 2016-07-24 ENCOUNTER — Other Ambulatory Visit: Payer: Self-pay | Admitting: *Deleted

## 2016-07-24 ENCOUNTER — Ambulatory Visit (HOSPITAL_COMMUNITY)
Admission: RE | Admit: 2016-07-24 | Discharge: 2016-07-24 | Disposition: A | Discharge status: Home / Self Care | Payer: Medicare Other | Source: Ambulatory Visit | Attending: Cardiovascular Disease | Admitting: Cardiovascular Disease

## 2016-07-24 DIAGNOSIS — I4891 Unspecified atrial fibrillation: Secondary | ICD-10-CM | POA: Diagnosis not present

## 2016-07-24 DIAGNOSIS — Z0189 Encounter for other specified special examinations: Secondary | ICD-10-CM

## 2016-07-24 DIAGNOSIS — Z01818 Encounter for other preprocedural examination: Secondary | ICD-10-CM | POA: Diagnosis not present

## 2016-07-24 DIAGNOSIS — I10 Essential (primary) hypertension: Secondary | ICD-10-CM | POA: Diagnosis not present

## 2016-07-24 DIAGNOSIS — Z7901 Long term (current) use of anticoagulants: Secondary | ICD-10-CM | POA: Diagnosis not present

## 2016-07-24 LAB — PROTIME-INR
INR: 1.1
Prothrombin Time: 11.3 s (ref 9.0–11.5)

## 2016-07-24 LAB — APTT: aPTT: 29 s (ref 22–34)

## 2016-07-25 LAB — CBC
HEMATOCRIT: 53.4 % — AB (ref 38.5–50.0)
Hemoglobin: 17.8 g/dL — ABNORMAL HIGH (ref 13.2–17.1)
MCH: 30.7 pg (ref 27.0–33.0)
MCHC: 33.3 g/dL (ref 32.0–36.0)
MCV: 92.1 fL (ref 80.0–100.0)
MPV: 11.4 fL (ref 7.5–12.5)
Platelets: 161 10*3/uL (ref 140–400)
RBC: 5.8 MIL/uL (ref 4.20–5.80)
RDW: 13.3 % (ref 11.0–15.0)
WBC: 7.5 10*3/uL (ref 3.8–10.8)

## 2016-07-25 LAB — BASIC METABOLIC PANEL
BUN: 11 mg/dL (ref 7–25)
CHLORIDE: 106 mmol/L (ref 98–110)
CO2: 29 mmol/L (ref 20–31)
Calcium: 9.4 mg/dL (ref 8.6–10.3)
Creat: 1.01 mg/dL (ref 0.70–1.25)
Glucose, Bld: 116 mg/dL — ABNORMAL HIGH (ref 65–99)
POTASSIUM: 5.1 mmol/L (ref 3.5–5.3)
Sodium: 143 mmol/L (ref 135–146)

## 2016-07-28 ENCOUNTER — Ambulatory Visit (HOSPITAL_COMMUNITY): Payer: Medicare Other | Admitting: Certified Registered"

## 2016-07-28 ENCOUNTER — Encounter (HOSPITAL_COMMUNITY)
Admission: RE | Disposition: A | Discharge status: Home / Self Care | Payer: Self-pay | Source: Ambulatory Visit | Attending: Cardiovascular Disease

## 2016-07-28 ENCOUNTER — Encounter (HOSPITAL_COMMUNITY): Payer: Self-pay | Admitting: *Deleted

## 2016-07-28 ENCOUNTER — Ambulatory Visit (HOSPITAL_COMMUNITY)
Admission: RE | Admit: 2016-07-28 | Discharge: 2016-07-28 | Disposition: A | Discharge status: Home / Self Care | Payer: Medicare Other | Source: Ambulatory Visit | Attending: Cardiovascular Disease | Admitting: Cardiovascular Disease

## 2016-07-28 DIAGNOSIS — I2510 Atherosclerotic heart disease of native coronary artery without angina pectoris: Secondary | ICD-10-CM | POA: Diagnosis not present

## 2016-07-28 DIAGNOSIS — Z7901 Long term (current) use of anticoagulants: Secondary | ICD-10-CM | POA: Insufficient documentation

## 2016-07-28 DIAGNOSIS — I4891 Unspecified atrial fibrillation: Secondary | ICD-10-CM | POA: Diagnosis not present

## 2016-07-28 DIAGNOSIS — Z7982 Long term (current) use of aspirin: Secondary | ICD-10-CM | POA: Diagnosis not present

## 2016-07-28 DIAGNOSIS — R0602 Shortness of breath: Secondary | ICD-10-CM | POA: Diagnosis not present

## 2016-07-28 DIAGNOSIS — R0789 Other chest pain: Secondary | ICD-10-CM | POA: Diagnosis not present

## 2016-07-28 DIAGNOSIS — Z91013 Allergy to seafood: Secondary | ICD-10-CM | POA: Insufficient documentation

## 2016-07-28 DIAGNOSIS — I10 Essential (primary) hypertension: Secondary | ICD-10-CM | POA: Insufficient documentation

## 2016-07-28 DIAGNOSIS — I4892 Unspecified atrial flutter: Secondary | ICD-10-CM | POA: Diagnosis not present

## 2016-07-28 DIAGNOSIS — D751 Secondary polycythemia: Secondary | ICD-10-CM | POA: Diagnosis not present

## 2016-07-28 DIAGNOSIS — I481 Persistent atrial fibrillation: Secondary | ICD-10-CM | POA: Diagnosis not present

## 2016-07-28 DIAGNOSIS — F17210 Nicotine dependence, cigarettes, uncomplicated: Secondary | ICD-10-CM | POA: Insufficient documentation

## 2016-07-28 DIAGNOSIS — M21372 Foot drop, left foot: Secondary | ICD-10-CM | POA: Insufficient documentation

## 2016-07-28 DIAGNOSIS — Z79899 Other long term (current) drug therapy: Secondary | ICD-10-CM | POA: Insufficient documentation

## 2016-07-28 DIAGNOSIS — I252 Old myocardial infarction: Secondary | ICD-10-CM | POA: Insufficient documentation

## 2016-07-28 DIAGNOSIS — E785 Hyperlipidemia, unspecified: Secondary | ICD-10-CM | POA: Diagnosis not present

## 2016-07-28 DIAGNOSIS — I454 Nonspecific intraventricular block: Secondary | ICD-10-CM | POA: Insufficient documentation

## 2016-07-28 DIAGNOSIS — G4733 Obstructive sleep apnea (adult) (pediatric): Secondary | ICD-10-CM | POA: Insufficient documentation

## 2016-07-28 DIAGNOSIS — Z955 Presence of coronary angioplasty implant and graft: Secondary | ICD-10-CM | POA: Diagnosis not present

## 2016-07-28 DIAGNOSIS — Z0189 Encounter for other specified special examinations: Secondary | ICD-10-CM

## 2016-07-28 DIAGNOSIS — K219 Gastro-esophageal reflux disease without esophagitis: Secondary | ICD-10-CM | POA: Insufficient documentation

## 2016-07-28 DIAGNOSIS — E119 Type 2 diabetes mellitus without complications: Secondary | ICD-10-CM | POA: Insufficient documentation

## 2016-07-28 DIAGNOSIS — Z794 Long term (current) use of insulin: Secondary | ICD-10-CM | POA: Diagnosis not present

## 2016-07-28 DIAGNOSIS — E782 Mixed hyperlipidemia: Secondary | ICD-10-CM | POA: Insufficient documentation

## 2016-07-28 HISTORY — PX: CARDIOVERSION: SHX1299

## 2016-07-28 SURGERY — CARDIOVERSION
Anesthesia: General

## 2016-07-28 MED ORDER — APIXABAN 5 MG PO TABS
5.0000 mg | ORAL_TABLET | Freq: Once | ORAL | Status: AC
  Administered 2016-07-28: 5 mg via ORAL
  Filled 2016-07-28: qty 1

## 2016-07-28 MED ORDER — LIDOCAINE HCL (CARDIAC) 20 MG/ML IV SOLN
INTRAVENOUS | Status: DC | PRN
  Administered 2016-07-28: 40 mg via INTRATRACHEAL

## 2016-07-28 MED ORDER — PROPOFOL 10 MG/ML IV BOLUS
INTRAVENOUS | Status: DC | PRN
  Administered 2016-07-28: 70 mg via INTRAVENOUS

## 2016-07-28 MED ORDER — SODIUM CHLORIDE 0.9 % IV SOLN
INTRAVENOUS | Status: DC
  Administered 2016-07-28: 08:00:00 via INTRAVENOUS

## 2016-07-28 NOTE — CV Procedure (Signed)
  CARDIOVERSION NOTE   Procedure: Electrical Cardioversion Indications:  Atrial Flutter  Procedure Details:  Consent: Risks of procedure as well as the alternatives and risks of each were explained to the (patient/caregiver).  Consent for procedure obtained.  Time Out: Verified patient identification, verified procedure, site/side was marked, verified correct patient position, special equipment/implants available, medications/allergies/relevent history reviewed, required imaging and test results available.  Performed  Patient placed on cardiac monitor, pulse oximetry, supplemental oxygen as necessary.  Sedation given: by Dr Linna Caprice; propofol 60 mg; lidocaine 40 mg Pacer pads placed anterior and posterior chest.  Cardioverted 1 time(s).  Cardioverted at 120J.  Evaluation: Findings: Post procedure EKG shows: NSR Complications: None Patient did tolerate procedure well.   Troy Sine, MD, [facility] 07/28/2016 8:09 AM

## 2016-07-28 NOTE — Anesthesia Postprocedure Evaluation (Signed)
Anesthesia Post Note  Patient: John [name]  Procedure(s) Performed: Procedure(s) (LRB): CARDIOVERSION (N/A)  Patient location during evaluation: Endoscopy Anesthesia Type: General Level of consciousness: awake, awake and alert and oriented Vital Signs Assessment: post-procedure vital signs reviewed and stable Respiratory status: spontaneous breathing, nonlabored ventilation and respiratory function stable Cardiovascular status: blood pressure returned to baseline Anesthetic complications: no       Last Vitals:  Vitals:   07/28/16 0825 07/28/16 0835  BP: 111/68 119/70  Pulse: (!) 55 (!) 54  Resp: 20 17  Temp:      Last Pain:  Vitals:   07/28/16 0817  TempSrc: Oral                 Ioanna Colquhoun COKER

## 2016-07-28 NOTE — Transfer of Care (Signed)
Immediate Anesthesia Transfer of Care Note  Patient: John [name]  Procedure(s) Performed: Procedure(s): CARDIOVERSION (N/A)  Patient Location: Endoscopy Unit  Anesthesia Type:General  Level of Consciousness: awake and alert   Airway & Oxygen Therapy: Patient Spontanous Breathing  Post-op Assessment: Report given to RN  Post vital signs: Reviewed and stable  Last Vitals:  Vitals:   07/28/16 0659  BP: (!) 148/85  Pulse: 71  Resp: 11  Temp: 37 C    Last Pain:  Vitals:   07/28/16 0659  TempSrc: Oral         Complications: No apparent anesthesia complications

## 2016-07-28 NOTE — H&P (View-Only) (Signed)
Patient ID: John [name], male   DOB: 10-21-1950, 66 y.o.   MRN: [id number]     HPI:  John [name] is a 66 y.o. male who is a former patient of Dr. Rollene Fare. He presents for a one month cardiology followup evaluation.   John [name] has known CAD and underwent multiple percutaneous cardiac interventions with stenting to circumflex and RCA. His last cardiac catheterization was done by [name] in September 2013 showed his RCA stented patent but he had 40% narrowing in the very distal aspect. He has a history of hypertension, as well as long-standing tobacco use with mild polycythemia. I had seen him in a sleep clinic after he obtained a new CPAP machine.  He has severe sleep apnea with an AHI on his initial diagnostic study of 44 per hour overall and 72.6 per hour with REM sleep.  A download last year  showed excellent benefit from CPAP with an AHI of 3.1.  His initial stent to his RCA was in 1996 was a Cypher PS [id number] sent. In 1999 he underwent non-DES stenting to circumflex after he presented with a posterior MI. He had additional 4-5 stents placed in his RCA.  He has a history of prior back surgery and has had a left foot drop.  He has significant obstructive sleep apnea and continues to utilize CPAP therapy.  When I saw him in the past despite CPAP therapy he complained of fatigue and residual daytime sleepiness and I suggested the possibility of Nuvigil or Provigil if necessary.    Last year he was seen  by [name] complaints of  atypical chest pain.  There was some concern that this may be reflux and pantoprazole was discontinued and replaced with dexilant. However, it was felt that the patient may require another stress test since his last evaluation was in 2012.  He also had had recent issues with some weight gain and shortness of breath and his intermittent Lasix dose changed to 40 mg daily.  When I saw him, I scheduled him for follow-up echo Doppler study as well as nuclear  perfusion study.  These were done on the same day on 05/06/2015.  Nuclear study was interpreted as a high risk study due to an ejection fraction of 31%.  There was a large fixed inferior and inferoseptal wall defect suggestive of scar and evidence for inferoseptal akinesis.  There was no ischemia.   On his prior nuclear study the EF in October 2012 was 38%.  An echo Doppler study done the same day however, showed discordant data and his ejection fraction was 50-55%.  Hypokinesis of the inferior inferolateral wall was noted.  There was grade 1 diastolic dysfunction.  There was mild aortic root dilatation at 41 mm.  His left atrium was severely dilated.  His right atrium was severely dilated.  There was trivial TR.  Of note, his last echo Doppler study had shown an EF of 45-50%.  When I  saw him in November 2017  he stated that he has been using CPAP.  However, for the past month he has noticed significant increased fatigue.  He was also found to have elevated sugars and his insulin was adjusted.  He admits to development of some trace ankle swelling, left greater than right.  He denied any episodes of chest pain.  He was unaware of spells of tachycardia.  He denied presyncope or syncope.  During that evaluation, he was found to be in atrial flutter with  variable block at 74 bpm of questionable duration.  At that time, I recommended that he discontinue Plavix and started him on eliquis 5 mg and further titrated his Bystolic to 10 mg.  His cha2ds2vascore is at least 4 and with his CAD he was advised to continue aspirin 81 mg.  He underwent an echo Doppler study on 06/02/2016 which showed an EF of 40-45% with moderate diffuse hypokinesis.  While they are, mild aortic sclerosis and increased atrial septal thickness consistent with lipomatous hypertrophy.  I obtained a download of his CPAP unit from 02/09/2016 through 05/08/2016 and he was continuing to meet Medicare compliance with 91% of days of usage and 79% with  usage greater than 4 hours.  He was averaging 5 hours and 55 minutes per night.  AHI was 3.7.  However, he had a very large leak.  I have recommended that he get a new mask from his DME company.    When I last saw him, she hated amiodarone at 200 mg and recommended that he increase this after 2 weeks to 400 mg.  He states that he felt significantly better after proximally 7-10 days of taking amiodarone at the 20 mg dose that he never further titrated this to 400 mg daily.  He is still not received a new mask from his DME company, which is [facility].  He has been taking anticoagulation.  There is no bleeding.  He denies chest pressure.  He presents for reevaluation.  Past Medical History:  Diagnosis Date  . Coronary artery disease   . Diabetes mellitus   . GERD (gastroesophageal reflux disease)   . History of nuclear stress test 04/04/2011   lexiscan; mod-large in size fixed inferolateral defect (scar); non-diagnostic for ischemia; low risk scan   . Hyperlipidemia   . Hypertension   . Left foot drop    r/t past disk srugery - uses Kevlar brace  . Myocardial infarction    posterior MI  . Shortness of breath   . Sleep apnea    on CPAP; 04/28/2007 split-night - AHI during total sleep 44.43/hr and REM 72.56/hr    Past Surgical History:  Procedure Laterality Date  . BACK SURGERY  1985  . CARDIAC CATHETERIZATION  2010   6 stents total  . CARDIAC CATHETERIZATION  01/2000   percutaneous transluminal coronary balloon angioplasty of mid RCA stenotic lesion  . CARDIAC CATHETERIZATION  06/2006   no stenting; ischemic cardiomyopathy, EF 40-45%  . CORONARY ANGIOPLASTY  09/1998   mid-distal RCA balloon dilatation, 4.5 & 5.0 stents   . CORONARY ANGIOPLASTY WITH STENT PLACEMENT  03/1994   angioplasty & stenting (non-DES) of circumflex/prox ramus intermedius  . CORONARY ANGIOPLASTY WITH STENT PLACEMENT  10/1994   large iliac [id number] stent to RCA  . CORONARY ANGIOPLASTY WITH STENT PLACEMENT   12/2002   4.5mm stents x2 of RCA  . CORONARY ANGIOPLASTY WITH STENT PLACEMENT  01/2005   cutting balloon arthrectomy of distal RCA & Cypher DES 3.5x13; cutting balloon arthrectomy of mid RCA with Cypher DES 3.5x18  . CORONARY ANGIOPLASTY WITH STENT PLACEMENT  11/2008   stenting of mid RCA with 4.0x12mm driver, non-DES  . LEFT HEART CATHETERIZATION WITH CORONARY ANGIOGRAM N/A 02/27/2012   Procedure: LEFT HEART CATHETERIZATION WITH CORONARY ANGIOGRAM;  Surgeon: Jonathan J Berry, MD;  Location: [facility] CATH [facility];  Service: Cardiovascular;  Laterality: N/A;  . TRANSTHORACIC ECHOCARDIOGRAM  07/29/2010   EF 50=55%, mod inf wall hypokinesis & mild post wall hypokinesis; LA mild-mod   dilated; mild mitral annular calcif & mild MR; mild TR & elevated RV systolic pressure; AV mildly sclerotic; mild aortic root dilatation     Allergies  Allergen Reactions  . Fish Allergy Itching  . Omega-3 Fatty Acids Itching    hives  . Fish Oil Itching and Rash    Current Outpatient Prescriptions  Medication Sig Dispense Refill  . amiodarone (PACERONE) 200 MG tablet Take 1 tablet (200 mg total) by mouth 2 (two) times daily. 60 tablet 11  . apixaban (ELIQUIS) 5 MG TABS tablet Take 1 tablet (5 mg total) by mouth 2 (two) times daily. 60 tablet 3  . aspirin 81 MG tablet Take 81 mg by mouth daily.    . benazepril (LOTENSIN) 10 MG tablet TAKE 1 TABLET (10 MG TOTAL) BY MOUTH DAILY. 30 tablet 11  . busPIRone (BUSPAR) 10 MG tablet TAKE 1 TABLET (10 MG TOTAL) BY MOUTH DAILY. 30 tablet 6  . Insulin Pen Needle (NOVOFINE) 32G X 6 MM MISC 1 each by Other route daily. 100 each 3  . LEVEMIR FLEXTOUCH 100 UNIT/ML Pen INJECT 26 UNITS INTO THE SKIN AT BEDTIME. ICD E11.65 15 pen 3  . montelukast (SINGULAIR) 10 MG tablet TAKE 1 TABLET BY MOUTH EVERY DAY 30 tablet 11  . nitroGLYCERIN (NITROSTAT) 0.4 MG SL tablet Place 0.4 mg under the tongue every 5 (five) minutes as needed for chest pain.    . ONE TOUCH ULTRA TEST test strip USE 1 STRIP 3 TIMES  DAILY - USE AS INSTRUCTED 100 each 5  . nebivolol (BYSTOLIC) 5 MG tablet Take 1 tablet (5 mg total) by mouth daily. 30 tablet 6  . rosuvastatin (CRESTOR) 20 MG tablet Take 1 tablet (20 mg total) by mouth daily. 90 tablet 3   No current facility-administered medications for this visit.     Social History   Social History  . Marital status: Married    Spouse name: N/A  . Number of children: 3  . Years of education: N/A   Occupational History  . 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 [place]   Social History Main Topics  . Smoking status: Current Some Day Smoker    Packs/day: 1.00    Years: 50.00    Types: Cigarettes  . Smokeless tobacco: Never Used  . Alcohol use 0.0 oz/week     Comment: occasionally  . Drug use: No  . Sexual activity: Yes   Other Topics Concern  . Not on file   Social History Narrative  . No narrative on file   Social history is known that he works as a Freight forwarder for [employer]. There is a long-standing tobacco history. He rarely drinks alcohol.  Family History  Problem Relation Age of Onset  . Heart attack Father    ROS General: Negative; No fevers, chills, or night sweats; Positive for fatigue  HEENT: Positive for loss of hearing in his right ear Pulmonary: Negative; No cough, wheezing, shortness of breath, hemoptysis Cardiovascular: see history of present illness GI: Negative; No nausea, vomiting, diarrhea, or abdominal pain GU: Negative; No dysuria, hematuria, or difficulty voiding Musculoskeletal: Negative; no myalgias, joint pain, or weakness Hematologic/Oncology: Negative; no easy bruising, bleeding Endocrine: Negative; no heat/cold intolerance; no diabetes Neuro: Negative; no changes in balance, headaches Skin: Negative; No rashes or skin lesions Psychiatric: Negative; No behavioral problems, depression Sleep: Positive for obstructive sleep apnea.  Mild residual daytime sleepiness; no bruxism, restless legs, hypnogognic hallucinations,  no cataplexy Other comprehensive 14  point system review is negative.   PE BP 128/86   Pulse (!) 57   Ht 6' (1.829 m)   Wt 224 lb (101.6 kg)   BMI 30.38 kg/m     Wt Readings from Last 3 Encounters:  07/20/16 224 lb (101.6 kg)  06/05/16 229 lb 6 oz (104 kg)  05/09/16 228 lb 6.4 oz (103.6 kg)    General: Alert, oriented, no distress.  Skin: normal turgor, no rashes HEENT: Normocephalic, atraumatic. Pupils round and reactive; sclera anicteric;no lid lag. Extraocular muscles intact. Nose without nasal septal hypertrophy Mouth/Parynx benign; Mallinpatti scale 3 Neck: No JVD, no carotid bruits; normal carotid upstroke Lungs: clear to ausculatation and percussion; no wheezing or rales Chest wall: no tenderness to palpitation Heart: Irregularly irregular rhythm with a controlled ventricular rate in the 60s., s1 s2 normal 1/6 systolic murmur. No rubs thrills or heaves Abdomen: soft, nontender; no hepatosplenomehaly, BS+; abdominal aorta nontender and not dilated by palpation. Back: no CVA tenderness Pulses 2+ Extremities: no clubbing cyanosis or edema, Homan's sign negative  Neurologic: grossly nonfocal; cranial nerves grossly normal. Psychologic: normal affect and mood.  ECG (independently read by me): Atrial fibrillation at 57 bpm.  Inferior Q waves.  An small inferolateral Q waves.  QTc interval 418 ms.  06/05/2016 ECG (independently read by me): Atrial flutter with a rate of 79 bpm with variable block.  QTc interval 467 ms.  November 2017 ECG (independently read by me): Atrial flutter with variable block, ventricular rate at 74.  LVH by voltage criteria in aVL.  Inferior Q waves concordant with prior MI.  May 2017 ECG (independently read by me): Sinus bradycardia at 51 bpm.  Old inferior infarct pattern with prominent inferior lateral Q waves  05/03/2015 ECG (independently read by me): Sinus bradycardia 54 bpm with PVC.  Inferior and lateral Q waves concordant with inferior  lateral MI  ECG (independently read by me): Sinus bradycardia 55 bpm.  Inferior Q waves compatible with old inferior MI.  No significant ST segment changes.  January 2015 ECG (independently read by me): Normal sinus rhythm at 75 beats per minute. Old inferior infarction with inferior Q waves. No other ST-T changes  LABS:  BMP Latest Ref Rng & Units 05/09/2016 01/07/2016 04/30/2015  Glucose 65 - 99 mg/dL 122(H) 98 152(H)  BUN 7 - 25 mg/dL _0 Creatinine 0.70 - 1.25 mg/dL 1.03 0.99 1.01  Sodium 135 - 146 mmol/L 143 144 142  Potassium 3.5 - 5.3 mmol/L 4.9 4.9 4.6  Chloride 98 - 110 mmol/L 108 104 103  CO2 20 - 31 mmol/L 24 30 33(H)  Calcium 8.6 - 10.3 mg/dL 9.3 8.8 9.4   Hepatic Function Latest Ref Rng & Units 05/09/2016 01/07/2016 04/08/2015  Total Protein 6.1 - 8.1 g/dL 6.2 5.8(L) 6.1  Albumin 3.6 - 5.1 g/dL 4.1 3.6 3.7  AST 10 - 35 U/L _1 ALT 9 - 46 U/L _2 Alk Phosphatase 40 - 115 U/L 64 65 79  Total Bilirubin 0.2 - 1.2 mg/dL 1.0 0.8 1.1  Bilirubin, Direct - - - -    CBC Latest Ref Rng & Units 05/09/2016 01/07/2016 04/08/2015  WBC 3.8 - 10.8 K/uL 7.1 9.3 6.5  Hemoglobin 13.2 - 17.1 g/dL 17.1 16.7 16.3  Hematocrit 38.5 - 50.0 % 50.6(H) 49.1 48.4  Platelets 140 - 400 K/uL 171 167 164   Lab Results  Component Value Date   MCV 92.0 05/09/2016   MCV 91.8  01/07/2016   MCV 91.5 04/08/2015   Lab Results  Component Value Date   TSH 0.68 05/09/2016    BNP No results found for: PROBNP  Lipid Panel     Component Value Date/Time   CHOL 106 05/09/2016 0934   TRIG 58 05/09/2016 0934   HDL 29 (L) 05/09/2016 0934   CHOLHDL 3.7 05/09/2016 0934   VLDL 12 05/09/2016 0934   LDLCALC 65 05/09/2016 0934     RADIOLOGY: No results found.  IMPRESSION:  1. Persistent atrial fibrillation ([place])   2. Coronary artery disease involving native coronary artery of native heart without angina pectoris   3. Essential hypertension   4. Anticoagulation adequate   5.  Pre-op testing   6. Chronic fatigue   7. OSA on CPAP   8. Mixed hyperlipidemia     ASSESSMENT AND PLAN: John [name] is a 66 year-old Caucasian male who has CAD and has undergone prior extensive stenting to his RCA and also stenting to his circumflex vesse dating back to 23.  An echo Doppler study in 2013 showed ejection fraction of 45-50%. There was severe inferior, inferolateral and inferoseptal hypokinesis in the basal inferolateral wall segments consistent with scar.  His last cardiac catheterization by [name] was in September 2013. His most recent nuclear perfusion study shows a large area of inferior scar without associated ischemia.  The ejection fraction on the nuclear study was reduced at 31%, making the scan, a high-risk study. His echo Doppler study done the same day showed discordant dated with an EF of 50%, but also with inferior hypokinesis.  I suspect his true ejection fraction is somewhere in between.  When he was seen in November 2017 he was in in atrial flutter with variable block, which undoubtedly had contributed to his fatigability.  His ejection fraction remains in the 40-45% range on his repeat echo of 06/02/2016.  His CPAP usage is adequate, although there is a considerable leak resulting from his mask and a new mask was recommended.  With his underlying coronary artery disease and LV dysfunction, I stated amiodarone at 200 mg daily with plans to titrate to 200 mg twice a day.  He presents today after only still being on 200 mg daily.  I have suggested that he titrate this to 200 mg twice a day in a hope that perhaps this can lead to pharmacologic cardioversion.  I will reduce his Bystolic from 10 mg to 5 mg.   A new mask for CPAP was prescribed.  I will plan to perform a DC cardioversion late next week on 07/28/2016.  I discussed the risk, benefits of the procedure any agrees to undergo this.  I again reviewed laboratory from November 2017.  On Crestor 20 mg. LDL is excellent at  65.  His blood pressure today is controlled on been benazepril10 mg in addition to his nebivolol.  There is no bleeding on eliquis.  I will see him next week at his cardioversion.   ime spent 25 minutes  Troy Sine, MD, [facility]  07/22/2016 12:13 PM

## 2016-07-28 NOTE — Interval H&P Note (Signed)
History and Physical Interval Note:  07/28/2016 7:58 AM  [name]  has presented today for surgery, with the diagnosis of AFIB  The various methods of treatment have been discussed with the patient and family. After consideration of risks, benefits and other options for treatment, the patient has consented to  Procedure(s): CARDIOVERSION (N/A) as a surgical intervention .  The patient's history has been reviewed, patient examined, no change in status, stable for surgery.  I have reviewed the patient's chart and labs.  Questions were answered to the patient's satisfaction.     Shelva Majestic

## 2016-07-28 NOTE — Anesthesia Preprocedure Evaluation (Addendum)
Anesthesia Evaluation  Patient identified by MRN, date of birth, ID band Patient awake    Reviewed: Allergy & Precautions, NPO status , Patient's Chart, lab work & pertinent test results  Airway Mallampati: II  TM Distance: >3 FB Neck ROM: Full    Dental  (+) Teeth Intact, Dental Advisory Given   Pulmonary Current Smoker,    breath sounds clear to auscultation       Cardiovascular hypertension,  Rhythm:Irregular Rate:Normal     Neuro/Psych    GI/Hepatic   Endo/Other  diabetes  Renal/GU      Musculoskeletal   Abdominal   Peds  Hematology   Anesthesia Other Findings   Reproductive/Obstetrics                           Anesthesia Physical Anesthesia Plan  ASA: III  Anesthesia Plan: General   Post-op Pain Management:    Induction: Intravenous  Airway Management Planned: Mask  Additional Equipment:   Intra-op Plan:   Post-operative Plan:   Informed Consent: I have reviewed the patients History and Physical, chart, labs and discussed the procedure including the risks, benefits and alternatives for the proposed anesthesia with the patient or authorized representative who has indicated his/her understanding and acceptance.     Plan Discussed with: CRNA and Anesthesiologist  Anesthesia Plan Comments:         Anesthesia Quick Evaluation

## 2016-07-28 NOTE — Discharge Instructions (Signed)
Electrical Cardioversion, Care After °This sheet gives you information about how to care for yourself after your procedure. Your health care provider may also give you more specific instructions. If you have problems or questions, contact your health care provider. °What can I expect after the procedure? °After the procedure, it is common to have: °· Some redness on the skin where the shocks were given. °Follow these instructions at home: °· Do not drive for 24 hours if you were given a medicine to help you relax (sedative). °· Take over-the-counter and prescription medicines only as told by your health care provider. °· Ask your health care provider how to check your pulse. Check it often. °· Rest for 48 hours after the procedure or as told by your health care provider. °· Avoid or limit your caffeine use as told by your health care provider. °Contact a health care provider if: °· You feel like your heart is beating too quickly or your pulse is not regular. °· You have a serious muscle cramp that does not go away. °Get help right away if: °· You have discomfort in your chest. °· You are dizzy or you feel faint. °· You have trouble breathing or you are short of breath. °· Your speech is slurred. °· You have trouble moving an arm or leg on one side of your body. °· Your fingers or toes turn cold or blue. °This information is not intended to replace advice given to you by your health care provider. Make sure you discuss any questions you have with your health care provider. °Document Released: 03/26/2013 Document Revised: 01/07/2016 Document Reviewed: 12/10/2015 °Elsevier Interactive Patient Education © 2017 [employer] ° °

## 2016-07-30 ENCOUNTER — Telehealth: Payer: Self-pay | Admitting: Internal Medicine

## 2016-07-30 NOTE — Telephone Encounter (Signed)
07/30/16 8:37 PM  66 yo M with coronary artery disease and afib s/p cardioverson on 2/9. He called the cardiology pager tonight because he is fatigued, "feels lousy", and has a throat pain. He states his pain is on the left side of his throat and has been getting worse through the day. I reviewed his chart and saw that he did not receive TEE nor was ET tube placed during his most recent procedure, so his symptoms are likely not associated with cardioversion. No chest pain/angina, palpitations, SOB, or dizziness/lightheadeness. Advised the patient that his symptoms are likely not due to DCCV or cardiac etiology. He may have a virus. I advised him to seek medical attention if it worsens.  Soyla Murphy, MD Cardiology

## 2016-07-31 ENCOUNTER — Encounter (HOSPITAL_COMMUNITY): Payer: Self-pay | Admitting: Cardiovascular Disease

## 2016-08-01 ENCOUNTER — Other Ambulatory Visit: Payer: Self-pay | Admitting: Family Medicine

## 2016-08-07 DIAGNOSIS — H11153 Pinguecula, bilateral: Secondary | ICD-10-CM | POA: Diagnosis not present

## 2016-08-07 DIAGNOSIS — H40013 Open angle with borderline findings, low risk, bilateral: Secondary | ICD-10-CM | POA: Diagnosis not present

## 2016-08-07 DIAGNOSIS — D3132 Benign neoplasm of left choroid: Secondary | ICD-10-CM | POA: Diagnosis not present

## 2016-08-07 DIAGNOSIS — H524 Presbyopia: Secondary | ICD-10-CM | POA: Diagnosis not present

## 2016-08-07 DIAGNOSIS — I10 Essential (primary) hypertension: Secondary | ICD-10-CM | POA: Diagnosis not present

## 2016-08-07 DIAGNOSIS — H33322 Round hole, left eye: Secondary | ICD-10-CM | POA: Diagnosis not present

## 2016-08-07 DIAGNOSIS — H26493 Other secondary cataract, bilateral: Secondary | ICD-10-CM | POA: Diagnosis not present

## 2016-08-07 DIAGNOSIS — H35033 Hypertensive retinopathy, bilateral: Secondary | ICD-10-CM | POA: Diagnosis not present

## 2016-08-07 DIAGNOSIS — Z961 Presence of intraocular lens: Secondary | ICD-10-CM | POA: Diagnosis not present

## 2016-08-07 DIAGNOSIS — H43813 Vitreous degeneration, bilateral: Secondary | ICD-10-CM | POA: Diagnosis not present

## 2016-08-07 DIAGNOSIS — Z794 Long term (current) use of insulin: Secondary | ICD-10-CM | POA: Diagnosis not present

## 2016-08-07 DIAGNOSIS — E119 Type 2 diabetes mellitus without complications: Secondary | ICD-10-CM | POA: Diagnosis not present

## 2016-08-07 LAB — HM DIABETES EYE EXAM

## 2016-08-11 DIAGNOSIS — L900 Lichen sclerosus et atrophicus: Secondary | ICD-10-CM | POA: Diagnosis not present

## 2016-08-11 DIAGNOSIS — D0359 Melanoma in situ of other part of trunk: Secondary | ICD-10-CM | POA: Diagnosis not present

## 2016-08-14 ENCOUNTER — Other Ambulatory Visit: Payer: Self-pay | Admitting: Family Medicine

## 2016-08-21 ENCOUNTER — Encounter: Payer: Self-pay | Admitting: Family Medicine

## 2016-09-05 ENCOUNTER — Ambulatory Visit: Payer: Medicare Other | Admitting: Podiatry

## 2016-09-05 DIAGNOSIS — Z85820 Personal history of malignant melanoma of skin: Secondary | ICD-10-CM | POA: Diagnosis not present

## 2016-09-05 DIAGNOSIS — Z85828 Personal history of other malignant neoplasm of skin: Secondary | ICD-10-CM | POA: Diagnosis not present

## 2016-09-05 DIAGNOSIS — D225 Melanocytic nevi of trunk: Secondary | ICD-10-CM | POA: Diagnosis not present

## 2016-09-05 DIAGNOSIS — D1801 Hemangioma of skin and subcutaneous tissue: Secondary | ICD-10-CM | POA: Diagnosis not present

## 2016-09-05 DIAGNOSIS — L814 Other melanin hyperpigmentation: Secondary | ICD-10-CM | POA: Diagnosis not present

## 2016-09-05 DIAGNOSIS — L821 Other seborrheic keratosis: Secondary | ICD-10-CM | POA: Diagnosis not present

## 2016-09-05 DIAGNOSIS — L820 Inflamed seborrheic keratosis: Secondary | ICD-10-CM | POA: Diagnosis not present

## 2016-09-05 DIAGNOSIS — L570 Actinic keratosis: Secondary | ICD-10-CM | POA: Diagnosis not present

## 2016-09-07 ENCOUNTER — Ambulatory Visit: Payer: Medicare Other

## 2016-09-18 ENCOUNTER — Encounter: Payer: Self-pay | Admitting: Family Medicine

## 2016-09-18 ENCOUNTER — Ambulatory Visit (INDEPENDENT_AMBULATORY_CARE_PROVIDER_SITE_OTHER): Payer: Medicare Other | Admitting: Family Medicine

## 2016-09-18 ENCOUNTER — Ambulatory Visit (HOSPITAL_COMMUNITY)
Admission: RE | Admit: 2016-09-18 | Discharge: 2016-09-18 | Disposition: A | Discharge status: Home / Self Care | Payer: Medicare Other | Source: Ambulatory Visit | Attending: Family Medicine | Admitting: Family Medicine

## 2016-09-18 VITALS — BP 110/56 | HR 58 | Temp 98.1°F | Resp 18 | Ht 72.0 in | Wt 224.0 lb

## 2016-09-18 DIAGNOSIS — G8929 Other chronic pain: Secondary | ICD-10-CM | POA: Insufficient documentation

## 2016-09-18 DIAGNOSIS — M545 Low back pain, unspecified: Secondary | ICD-10-CM

## 2016-09-18 DIAGNOSIS — M47899 Other spondylosis, site unspecified: Secondary | ICD-10-CM | POA: Insufficient documentation

## 2016-09-18 DIAGNOSIS — I2510 Atherosclerotic heart disease of native coronary artery without angina pectoris: Secondary | ICD-10-CM

## 2016-09-18 NOTE — Progress Notes (Signed)
Subjective:    Patient ID: John [name], male    DOB: 03-17-1951, 66 y.o.   MRN: [id number]  HPI Patient is that he had back surgery in 1985 and again in 1987 due to an acute injury causing a ruptured disc. However I do not have specifics of the surgery. However he states that ever since that time he has been dealing with chronic low back pain. The pain radiates from L5 up to L1 but it stays in the middle of his back. He states that the pain is constant. It hurts to sleep. It hurts to sit. It hurts to stand. It hurts to walk. He denies any radiation of the pain into his right leg or his left leg. He denies any symptoms of cauda equina syndrome. He denies any saddle anesthesia. He denies any blood in his urine or blood in the stool he is requesting referral to a neurosurgeon. Past Medical History:  Diagnosis Date  . Coronary artery disease   . Diabetes mellitus   . GERD (gastroesophageal reflux disease)   . History of nuclear stress test 04/04/2011   lexiscan; mod-large in size fixed inferolateral defect (scar); non-diagnostic for ischemia; low risk scan   . Hyperlipidemia   . Hypertension   . Left foot drop    r/t past disk srugery - uses Kevlar brace  . Myocardial infarction    posterior MI  . Shortness of breath   . Sleep apnea    on CPAP; 04/28/2007 split-night - AHI during total sleep 44.43/hr and REM 72.56/hr   Past Surgical History:  Procedure Laterality Date  . BACK SURGERY  1985  . CARDIAC CATHETERIZATION  2010   6 stents total  . CARDIAC CATHETERIZATION  01/2000   percutaneous transluminal coronary balloon angioplasty of mid RCA stenotic lesion  . CARDIAC CATHETERIZATION  06/2006   no stenting; ischemic cardiomyopathy, EF 40-45%  . CARDIOVERSION N/A 07/28/2016   Procedure: CARDIOVERSION;  Surgeon: Troy Sine, MD;  Location: [facility];  Service: Cardiovascular;  Laterality: N/A;  . CORONARY ANGIOPLASTY  09/1998   mid-distal RCA balloon dilatation, 4.5 & 5.0 stents     . CORONARY ANGIOPLASTY WITH STENT PLACEMENT  03/1994   angioplasty & stenting (non-DES) of circumflex/prox ramus intermedius  . CORONARY ANGIOPLASTY WITH STENT PLACEMENT  10/1994   large iliac [id number] stent to RCA  . CORONARY ANGIOPLASTY WITH STENT PLACEMENT  12/2002   4.31m stents x2 of RCA  . CORONARY ANGIOPLASTY WITH STENT PLACEMENT  01/2005   cutting balloon arthrectomy of distal RCA & Cypher DES 3.5x13; cutting balloon arthrectomy of mid RCA with Cypher DES 3.5x18  . CORONARY ANGIOPLASTY WITH STENT PLACEMENT  11/2008   stenting of mid RCA with 4.0x176mdriver, non-DES  . LEFT HEART CATHETERIZATION WITH CORONARY ANGIOGRAM N/A 02/27/2012   Procedure: LEFT HEART CATHETERIZATION WITH CORONARY ANGIOGRAM;  Surgeon: JoLorretta HarpMD;  [facility];  Service: Cardiovascular;  Laterality: N/A;  . TRANSTHORACIC ECHOCARDIOGRAM  07/29/2010   EF 50=55%, mod inf wall hypokinesis & mild post wall hypokinesis; LA mild-mod dilated; mild mitral annular calcif & mild MR; mild TR & elevated RV systolic pressure; AV mildly sclerotic; mild aortic root dilatation    Current Outpatient Prescriptions on File Prior to Visit  Medication Sig Dispense Refill  . amiodarone (PACERONE) 200 MG tablet Take 1 tablet (200 mg total) by mouth 2 (two) times daily. 60 tablet 11  . apixaban (ELIQUIS) 5 MG TABS tablet Take 1 tablet (5  mg total) by mouth 2 (two) times daily. 60 tablet 3  . aspirin 81 MG tablet Take 81 mg by mouth daily.    . benazepril (LOTENSIN) 10 MG tablet TAKE 1 TABLET (10 MG TOTAL) BY MOUTH DAILY. 30 tablet 11  . busPIRone (BUSPAR) 10 MG tablet TAKE 1 TABLET (10 MG TOTAL) BY MOUTH DAILY. 30 tablet 6  . Insulin Pen Needle (NOVOFINE) 32G X 6 MM MISC 1 each by Other route daily. 100 each 3  . LEVEMIR FLEXTOUCH 100 UNIT/ML Pen INJECT 26 UNITS INTO THE SKIN AT BEDTIME. ICD E11.65 15 mL 3  . montelukast (SINGULAIR) 10 MG tablet TAKE 1 TABLET BY MOUTH EVERY DAY 30 tablet 11  . nebivolol (BYSTOLIC) 5 MG  tablet Take 1 tablet (5 mg total) by mouth daily. 30 tablet 6  . nitroGLYCERIN (NITROSTAT) 0.4 MG SL tablet Place 0.4 mg under the tongue every 5 (five) minutes as needed for chest pain.    . ONE TOUCH ULTRA TEST test strip USE 1 STRIP 3 TIMES DAILY - USE AS INSTRUCTED 100 each 5  . rosuvastatin (CRESTOR) 20 MG tablet Take 1 tablet (20 mg total) by mouth daily. 90 tablet 3   No current facility-administered medications on file prior to visit.    Allergies  Allergen Reactions  . Fish Allergy Itching  . Omega-3 Fatty Acids Itching    hives  . Fish Oil Itching and Rash   Social History   Social History  . Marital status: Married    Spouse name: N/A  . Number of children: 3  . Years of education: N/A   Occupational History  . Freight forwarder Other    [place], [place]. [place]   Social History Main Topics  . Smoking status: Current Some Day Smoker    Packs/day: 1.00    Years: 50.00    Types: Cigarettes  . Smokeless tobacco: Never Used  . Alcohol use 0.0 oz/week     Comment: occasionally  . Drug use: No  . Sexual activity: Yes   Other Topics Concern  . Not on file   Social History Narrative  . No narrative on file      Review of Systems  All other systems reviewed and are negative.      Objective:   Physical Exam  Constitutional: He appears well-developed and well-nourished.  Cardiovascular: Normal rate and normal heart sounds.   Pulmonary/Chest: Effort normal and breath sounds normal.  Musculoskeletal:       Lumbar back: He exhibits decreased range of motion, tenderness, bony tenderness and pain.  Vitals reviewed.         Assessment & Plan:  Chronic midline low back pain without sciatica - Plan: DG Lumbar Spine Complete  Begin by obtaining basic x-rays of lumbar spine.  There has been no baseline imaging obtained in several years. I will start with x-rays to determine if the patient has evidence of degenerative disc disease, etc. as well as to rule out bone  cancer etc. Patient will likely require an MRI after that. Also start taking Tylenol thousand milligrams by mouth twice a day. Avoid NSAIDs because of his anticoagulant.

## 2016-09-19 ENCOUNTER — Other Ambulatory Visit: Payer: Self-pay | Admitting: Family Medicine

## 2016-09-19 DIAGNOSIS — M545 Low back pain, unspecified: Secondary | ICD-10-CM

## 2016-09-19 DIAGNOSIS — G8929 Other chronic pain: Secondary | ICD-10-CM

## 2016-09-23 ENCOUNTER — Ambulatory Visit
Admission: RE | Admit: 2016-09-23 | Discharge: 2016-09-23 | Disposition: A | Discharge status: Home / Self Care | Payer: Medicare Other | Source: Ambulatory Visit | Attending: Family Medicine | Admitting: Family Medicine

## 2016-09-23 DIAGNOSIS — G8929 Other chronic pain: Secondary | ICD-10-CM

## 2016-09-23 DIAGNOSIS — M545 Low back pain, unspecified: Secondary | ICD-10-CM

## 2016-09-23 DIAGNOSIS — M48061 Spinal stenosis, lumbar region without neurogenic claudication: Secondary | ICD-10-CM | POA: Diagnosis not present

## 2016-09-28 ENCOUNTER — Other Ambulatory Visit: Payer: Medicare Other

## 2016-10-12 ENCOUNTER — Other Ambulatory Visit (HOSPITAL_COMMUNITY): Payer: Self-pay | Admitting: Cardiology

## 2016-10-12 MED ORDER — APIXABAN 5 MG PO TABS: 5.0000 mg | ORAL_TABLET | Freq: Two times a day (BID) | ORAL | 5 refills | Status: DC

## 2016-10-12 NOTE — Telephone Encounter (Signed)
FOLLOWED BY DR Claiborne Billings

## 2016-10-13 ENCOUNTER — Telehealth: Payer: Self-pay | Admitting: Cardiovascular Disease

## 2016-10-13 NOTE — Telephone Encounter (Signed)
Returned the phone call to the patient. He requested a refill for Eliquis. According to the chart it was called in yesterday. He verbalized his understanding.

## 2016-10-13 NOTE — Telephone Encounter (Signed)
New Message  Pt voiced would like for nurse to return his call.  Please f/u

## 2016-11-06 ENCOUNTER — Other Ambulatory Visit: Payer: Self-pay | Admitting: Cardiovascular Disease

## 2016-11-06 NOTE — Telephone Encounter (Signed)
Rx(s) sent to pharmacy electronically.  

## 2016-11-10 ENCOUNTER — Telehealth: Payer: Self-pay | Admitting: Cardiovascular Disease

## 2016-11-10 MED ORDER — APIXABAN 5 MG PO TABS: 5.0000 mg | ORAL_TABLET | Freq: Two times a day (BID) | ORAL | 3 refills | Status: DC

## 2016-11-10 NOTE — Telephone Encounter (Signed)
Returned call to patient. He states he wants know if he can take daily instead of BID - he states the cost is increasing. He thinks he is in the donut hole.   Patient would like to see if he can qualify for patient assistance. Will print application and Rx and give to [facility] for MD to complete and then mail application to patient.

## 2016-11-10 NOTE — Telephone Encounter (Signed)
Please call,question about his Eliquis.

## 2016-11-14 ENCOUNTER — Other Ambulatory Visit: Payer: Self-pay | Admitting: *Deleted

## 2016-11-14 MED ORDER — APIXABAN 5 MG PO TABS: 5.0000 mg | ORAL_TABLET | Freq: Two times a day (BID) | ORAL | 3 refills | Status: DC

## 2016-11-14 NOTE — Telephone Encounter (Signed)
Printed Eliquis prescription to send with form for patient assistance program.

## 2017-02-05 ENCOUNTER — Other Ambulatory Visit: Payer: Self-pay | Admitting: Cardiovascular Disease

## 2017-02-20 ENCOUNTER — Other Ambulatory Visit: Payer: Self-pay | Admitting: *Deleted

## 2017-02-20 MED ORDER — INSULIN PEN NEEDLE 32G X 6 MM MISC
1.0000 | Freq: Every day | 3 refills | Status: DC
Start: 2017-02-20 — End: 2019-08-19

## 2017-03-08 ENCOUNTER — Other Ambulatory Visit: Payer: Self-pay | Admitting: Cardiovascular Disease

## 2017-03-09 DIAGNOSIS — Z85820 Personal history of malignant melanoma of skin: Secondary | ICD-10-CM | POA: Diagnosis not present

## 2017-03-09 DIAGNOSIS — L570 Actinic keratosis: Secondary | ICD-10-CM | POA: Diagnosis not present

## 2017-03-09 DIAGNOSIS — L905 Scar conditions and fibrosis of skin: Secondary | ICD-10-CM | POA: Diagnosis not present

## 2017-03-09 DIAGNOSIS — D229 Melanocytic nevi, unspecified: Secondary | ICD-10-CM | POA: Diagnosis not present

## 2017-03-09 DIAGNOSIS — D485 Neoplasm of uncertain behavior of skin: Secondary | ICD-10-CM | POA: Diagnosis not present

## 2017-03-09 DIAGNOSIS — L821 Other seborrheic keratosis: Secondary | ICD-10-CM | POA: Diagnosis not present

## 2017-03-15 ENCOUNTER — Ambulatory Visit (INDEPENDENT_AMBULATORY_CARE_PROVIDER_SITE_OTHER): Payer: Medicare Other | Admitting: Family Medicine

## 2017-03-15 DIAGNOSIS — Z23 Encounter for immunization: Secondary | ICD-10-CM | POA: Diagnosis not present

## 2017-03-23 DIAGNOSIS — L570 Actinic keratosis: Secondary | ICD-10-CM | POA: Diagnosis not present

## 2017-03-23 DIAGNOSIS — L820 Inflamed seborrheic keratosis: Secondary | ICD-10-CM | POA: Diagnosis not present

## 2017-04-05 ENCOUNTER — Other Ambulatory Visit: Payer: Self-pay | Admitting: Cardiovascular Disease

## 2017-04-05 ENCOUNTER — Other Ambulatory Visit: Payer: Self-pay | Admitting: Family Medicine

## 2017-04-05 NOTE — Telephone Encounter (Signed)
REFILL 

## 2017-04-10 ENCOUNTER — Telehealth: Payer: Self-pay | Admitting: Cardiovascular Disease

## 2017-04-10 ENCOUNTER — Other Ambulatory Visit: Payer: Self-pay | Admitting: Family Medicine

## 2017-04-10 DIAGNOSIS — E1165 Type 2 diabetes mellitus with hyperglycemia: Secondary | ICD-10-CM

## 2017-04-10 DIAGNOSIS — IMO0002 Reserved for concepts with insufficient information to code with codable children: Secondary | ICD-10-CM

## 2017-04-10 MED ORDER — APIXABAN 5 MG PO TABS: 5.0000 mg | ORAL_TABLET | Freq: Two times a day (BID) | ORAL | 1 refills | Status: DC

## 2017-04-10 NOTE — Telephone Encounter (Signed)
Patient called and notified he is to continue this medication. Apologized for inconvenience of incorrect notification. Rx(s) sent to pharmacy electronically.

## 2017-04-10 NOTE — Telephone Encounter (Signed)
New message    Pt c/o medication issue:  1. Name of Medication: eliquis  2. How are you currently taking this medication (dosage and times per day)? 5 mg  3. Are you having a reaction (difficulty breathing--STAT)? no  4. What is your medication issue? Pt wants to know if he is being taken off this medication. He said he got a text saying that the medication will not be refilled. Please call.

## 2017-04-11 ENCOUNTER — Other Ambulatory Visit: Payer: Self-pay | Admitting: Family Medicine

## 2017-04-11 MED ORDER — GLUCOSE BLOOD VI STRP: ORAL_STRIP | 2 refills | Status: DC

## 2017-04-19 ENCOUNTER — Ambulatory Visit (INDEPENDENT_AMBULATORY_CARE_PROVIDER_SITE_OTHER): Payer: Medicare Other | Admitting: Cardiovascular Disease

## 2017-04-19 ENCOUNTER — Encounter: Payer: Self-pay | Admitting: Cardiovascular Disease

## 2017-04-19 VITALS — BP 175/81 | HR 53 | Ht 72.0 in | Wt 228.8 lb

## 2017-04-19 DIAGNOSIS — I10 Essential (primary) hypertension: Secondary | ICD-10-CM | POA: Diagnosis not present

## 2017-04-19 DIAGNOSIS — Z720 Tobacco use: Secondary | ICD-10-CM

## 2017-04-19 DIAGNOSIS — G4733 Obstructive sleep apnea (adult) (pediatric): Secondary | ICD-10-CM

## 2017-04-19 DIAGNOSIS — I2510 Atherosclerotic heart disease of native coronary artery without angina pectoris: Secondary | ICD-10-CM | POA: Diagnosis not present

## 2017-04-19 DIAGNOSIS — Z9989 Dependence on other enabling machines and devices: Secondary | ICD-10-CM

## 2017-04-19 DIAGNOSIS — E785 Hyperlipidemia, unspecified: Secondary | ICD-10-CM | POA: Diagnosis not present

## 2017-04-19 DIAGNOSIS — I739 Peripheral vascular disease, unspecified: Secondary | ICD-10-CM

## 2017-04-19 DIAGNOSIS — R0602 Shortness of breath: Secondary | ICD-10-CM

## 2017-04-19 DIAGNOSIS — Z79899 Other long term (current) drug therapy: Secondary | ICD-10-CM | POA: Diagnosis not present

## 2017-04-19 DIAGNOSIS — I483 Typical atrial flutter: Secondary | ICD-10-CM | POA: Diagnosis not present

## 2017-04-19 LAB — COMPREHENSIVE METABOLIC PANEL
A/G RATIO: 1.6 (ref 1.2–2.2)
ALT: 29 IU/L (ref 0–44)
AST: 18 IU/L (ref 0–40)
Albumin: 4 g/dL (ref 3.6–4.8)
Alkaline Phosphatase: 84 IU/L (ref 39–117)
BILIRUBIN TOTAL: 0.8 mg/dL (ref 0.0–1.2)
BUN / CREAT RATIO: 12 (ref 10–24)
BUN: 13 mg/dL (ref 8–27)
CALCIUM: 9.3 mg/dL (ref 8.6–10.2)
CHLORIDE: 101 mmol/L (ref 96–106)
CO2: 24 mmol/L (ref 20–29)
Creatinine, Ser: 1.12 mg/dL (ref 0.76–1.27)
GFR, EST AFRICAN AMERICAN: 78 mL/min/{1.73_m2} (ref >59)
GFR, EST NON AFRICAN AMERICAN: 67 mL/min/{1.73_m2} (ref >59)
GLOBULIN, TOTAL: 2.5 g/dL (ref 1.5–4.5)
Glucose: 155 mg/dL — ABNORMAL HIGH (ref 65–99)
POTASSIUM: 4.9 mmol/L (ref 3.5–5.2)
SODIUM: 141 mmol/L (ref 134–144)
TOTAL PROTEIN: 6.5 g/dL (ref 6.0–8.5)

## 2017-04-19 LAB — CBC
Hematocrit: 48.1 % (ref 37.5–51.0)
Hemoglobin: 16.6 g/dL (ref 13.0–17.7)
MCH: 30.9 pg (ref 26.6–33.0)
MCHC: 34.5 g/dL (ref 31.5–35.7)
MCV: 90 fL (ref 79–97)
Platelets: 182 10*3/uL (ref 150–379)
RBC: 5.37 x10E6/uL (ref 4.14–5.80)
RDW: 13.8 % (ref 12.3–15.4)
WBC: 10.6 10*3/uL (ref 3.4–10.8)

## 2017-04-19 LAB — LIPID PANEL
CHOL/HDL RATIO: 3.7 ratio (ref 0.0–5.0)
Cholesterol, Total: 129 mg/dL (ref 100–199)
HDL: 35 mg/dL — AB (ref >39)
LDL Calculated: 75 mg/dL (ref 0–99)
Triglycerides: 96 mg/dL (ref 0–149)
VLDL Cholesterol Cal: 19 mg/dL (ref 5–40)

## 2017-04-19 LAB — TSH: TSH: 1.33 u[IU]/mL (ref 0.450–4.500)

## 2017-04-19 NOTE — Progress Notes (Signed)
Patient ID: John [name], male   DOB: June 16, 1951, 66 y.o.   MRN: [id number]     HPI:  John [name] is a 66 y.o. male who is a former patient of John [name]. He presents for a 9 month cardiology followup evaluation.   John [name] has known CAD and underwent multiple percutaneous cardiac interventions with stenting to circumflex and RCA. His last cardiac catheterization was done by John [name] in September 2013 showed his RCA stented patent but he had 40% narrowing in the very distal aspect. He has a history of hypertension, as well as long-standing tobacco use with mild polycythemia. I had seen him in a sleep clinic after he obtained a new CPAP machine.  He has severe sleep apnea with an AHI on his initial diagnostic study of 44 per hour overall and 72.6 per hour with REM sleep.  A download last year  showed excellent benefit from CPAP with an AHI of 3.1.  His initial stent to his RCA was in 1996 was a Cypher PS [id number] sent. In 1999 he underwent non-DES stenting to circumflex after he presented with a posterior MI. He had additional 4-5 stents placed in his RCA.  He has a history of prior back surgery and has had a left foot drop.  He has significant obstructive sleep apnea and continues to utilize CPAP therapy.  When I saw him in the past despite CPAP therapy he complained of fatigue and residual daytime sleepiness and I suggested the possibility of Nuvigil or Provigil if necessary.    Remotely he was seen  by John [name] complaints of  atypical chest pain.  There was some concern that this may be reflux and pantoprazole was discontinued and replaced with dexilant. However, it was felt that the patient may require another stress test since his last evaluation was in 2012.  He also had had recent issues with some weight gain and shortness of breath and his intermittent Lasix dose changed to 40 mg daily.  On 05/06/2015 he underwent a nuclear stress test which was interpreted as a high risk study  due to an ejection fraction of 31%.  There was a large fixed inferior and inferoseptal wall defect suggestive of scar and evidence for inferoseptal akinesis.  There was no ischemia.   On his prior nuclear study the EF in October 2012 was 38%.  An echo Doppler study done the same day however, showed discordant data and his ejection fraction was 50-55%.  Hypokinesis of the inferior inferolateral wall was noted.  There was grade 1 diastolic dysfunction.  There was mild aortic root dilatation at 41 mm.  His left atrium was severely dilated.  His right atrium was severely dilated.  There was trivial TR.  Of note, his last echo Doppler study had shown an EF of 45-50%.  When I  saw him in November 2017  he stated that he has been using CPAP.  However, for the past month he has noticed significant increased fatigue.  He was also [name] to have elevated sugars and his insulin was adjusted.  He admits to development of some trace ankle swelling, left greater than right.  He denied any episodes of chest pain.  He was unaware of spells of tachycardia.  He denied presyncope or syncope.  During that evaluation, he was [name] to be in atrial flutter with variable block at 74 bpm of questionable duration.  At that time, I recommended that he discontinue Plavix and started him on eliquis  5 mg and further titrated his Bystolic to 10 mg.  His cha2ds2vascore is at least 4 and with his CAD he was advised to continue aspirin 81 mg.  He underwent an echo Doppler study on 06/02/2016 which showed an EF of 40-45% with moderate diffuse hypokinesis.  While they are, mild aortic sclerosis and increased atrial septal thickness consistent with lipomatous hypertrophy.  I obtained a download of his CPAP unit from 02/09/2016 through 05/08/2016 and he was continuing to meet Medicare compliance with 91% of days of usage and 79% with usage greater than 4 hours.  He was averaging 5 hours and 55 minutes per night.  AHI was 3.7.  However, he had a very  large leak.  I have recommended that he get a new mask from his DME company.    Since I last saw him in February 2018 he underwent successful cardioversion for atrial flutter.  He is unaware of any recurrent arrhythmia.  He continues to be on amiodarone, and Bystolic.  He is on eliquis for anticoagulation.  He is diabetic on insulin.  He continues to take them as a pleural and Bystolic for hypertension.  He is on rosuvastatin 20 mg for hyperlipidemia.  he has experienced some lower extremity claudication symptoms, predominantly involving the left leg.  Unfortunately he is still smoking half pack per day and has smoked for over 55 years.  He continues to use CPAP therapy with 100% compliance.  He is now retired as a Chartered certified accountant for [employer]. He had experienced some back discomfort.  He denies any classic exertional chest tightnessT.  He presents for evaluation.  Past Medical History:  Diagnosis Date  . Coronary artery disease   . Diabetes mellitus   . GERD (gastroesophageal reflux disease)   . History of nuclear stress test 04/04/2011   lexiscan; mod-large in size fixed inferolateral defect (scar); non-diagnostic for ischemia; low risk scan   . Hyperlipidemia   . Hypertension   . Left foot drop    r/t past disk srugery - uses Kevlar brace  . Myocardial infarction ([place])    posterior MI  . Shortness of breath   . Sleep apnea    on CPAP; 04/28/2007 split-night - AHI during total sleep 44.43/hr and REM 72.56/hr    Past Surgical History:  Procedure Laterality Date  . BACK SURGERY  1985  . CARDIAC CATHETERIZATION  2010   6 stents total  . CARDIAC CATHETERIZATION  01/2000   percutaneous transluminal coronary balloon angioplasty of mid RCA stenotic lesion  . CARDIAC CATHETERIZATION  06/2006   no stenting; ischemic cardiomyopathy, EF 40-45%  . CARDIOVERSION N/A 07/28/2016   Procedure: CARDIOVERSION;  Surgeon: John Sine, MD;  Location: [place];  Service: Cardiovascular;  Laterality:  N/A;  . CORONARY ANGIOPLASTY  09/1998   mid-distal RCA balloon dilatation, 4.5 & 5.0 stents   . CORONARY ANGIOPLASTY WITH STENT PLACEMENT  03/1994   angioplasty & stenting (non-DES) of circumflex/prox ramus intermedius  . CORONARY ANGIOPLASTY WITH STENT PLACEMENT  10/1994   large iliac [id number] stent to RCA  . CORONARY ANGIOPLASTY WITH STENT PLACEMENT  12/2002   4.4m stents x2 of RCA  . CORONARY ANGIOPLASTY WITH STENT PLACEMENT  01/2005   cutting balloon arthrectomy of distal RCA & Cypher DES 3.5x13; cutting balloon arthrectomy of mid RCA with Cypher DES 3.5x18  . CORONARY ANGIOPLASTY WITH STENT PLACEMENT  11/2008   stenting of mid RCA with 4.0x160mdriver, non-DES  . LEFT HEART CATHETERIZATION WITH CORONARY  ANGIOGRAM N/A 02/27/2012   Procedure: LEFT HEART CATHETERIZATION WITH CORONARY ANGIOGRAM;  Surgeon: Lorretta Harp, MD;  Location: [facility];  Service: Cardiovascular;  Laterality: N/A;  . TRANSTHORACIC ECHOCARDIOGRAM  07/29/2010   EF 50=55%, mod inf wall hypokinesis & mild post wall hypokinesis; LA mild-mod dilated; mild mitral annular calcif & mild MR; mild TR & elevated RV systolic pressure; AV mildly sclerotic; mild aortic root dilatation     Allergies  Allergen Reactions  . Fish Allergy Itching  . Omega-3 Fatty Acids Itching    hives  . Fish Oil Itching and Rash    Current Outpatient Prescriptions  Medication Sig Dispense Refill  . amiodarone (PACERONE) 200 MG tablet Take 1 tablet (200 mg total) by mouth 2 (two) times daily. 60 tablet 11  . apixaban (ELIQUIS) 5 MG TABS tablet Take 1 tablet (5 mg total) by mouth 2 (two) times daily. 60 tablet 1  . aspirin 81 MG tablet Take 81 mg by mouth daily.    . benazepril (LOTENSIN) 10 MG tablet TAKE 1 TABLET (10 MG TOTAL) BY MOUTH DAILY. 30 tablet 6  . BYSTOLIC 5 MG tablet TAKE 1 TABLET BY MOUTH EVERY DAY 30 tablet 0  . glucose blood (ONE TOUCH ULTRA TEST) test strip USE 1 STRIP 3 TIMES DAILY - USE AS INSTRUCTED DX: E11.9 100 each 2  .  Insulin Pen Needle (NOVOFINE) 32G X 6 MM MISC 1 each by Other route daily. 100 each 3  . LEVEMIR FLEXTOUCH 100 UNIT/ML Pen INJECT 26 UNITS INTO THE SKIN AT BEDTIME. ICD E11.65 15 pen 3  . montelukast (SINGULAIR) 10 MG tablet TAKE 1 TABLET BY MOUTH EVERY DAY 30 tablet 11  . nitroGLYCERIN (NITROSTAT) 0.4 MG SL tablet Place 0.4 mg under the tongue every 5 (five) minutes as needed for chest pain.    . rosuvastatin (CRESTOR) 20 MG tablet Take 1 tablet (20 mg total) by mouth daily. 90 tablet 3   No current facility-administered medications for this visit.     Social History   Social History  . Marital status: Married    Spouse name: N/A  . Number of children: 3  . Years of education: N/A   Occupational History  . 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 [place]   Social History Main Topics  . Smoking status: Current Some Day Smoker    Packs/day: 1.00    Years: 50.00    Types: Cigarettes  . Smokeless tobacco: Never Used  . Alcohol use 0.0 oz/week     Comment: occasionally  . Drug use: No  . Sexual activity: Yes   Other Topics Concern  . Not on file   Social History Narrative  . No narrative on file   Social history is known that he works as a Freight forwarder for [employer]. There is a long-standing tobacco history. He rarely drinks alcohol.  Family History  Problem Relation Age of Onset  . Heart attack Father    ROS General: Negative; No fevers, chills, or night sweats; Positive for fatigue  HEENT: Positive for loss of hearing in his right ear Pulmonary: Negative; No cough, wheezing, shortness of breath, hemoptysis Cardiovascular: see history of present illness Positive for possible claudication GI: Negative; No nausea, vomiting, diarrhea, or abdominal pain GU: Negative; No dysuria, hematuria, or difficulty voiding Musculoskeletal: Negative; no myalgias, joint pain, or weakness Hematologic/Oncology: Negative; no easy bruising, bleeding Endocrine: Negative; no heat/cold  intolerance; no diabetes Neuro: Negative; no changes in balance,  headaches Skin: Negative; No rashes or skin lesions Psychiatric: Negative; No behavioral problems, depression Sleep: Positive for obstructive sleep apnea.  Mild residual daytime sleepiness; no bruxism, restless legs, hypnogognic hallucinations, no cataplexy Other comprehensive 14 point system review is negative.   PE BP (!) 175/81   Pulse (!) 53   Ht 6' (1.829 m)   Wt 228 lb 12.8 oz (103.8 kg)   BMI 31.03 kg/m    Repeat blood pressure by me was 142/80  Wt Readings from Last 3 Encounters:  04/19/17 228 lb 12.8 oz (103.8 kg)  09/18/16 224 lb (101.6 kg)  07/20/16 224 lb (101.6 kg)   General: Alert, oriented, no distress.  Skin: normal turgor, no rashes, warm and dry HEENT: Normocephalic, atraumatic. Pupils equal round and reactive to light; sclera anicteric; extraocular muscles intact; Nose without nasal septal hypertrophy Mouth/Parynx benign; Mallinpatti scale 3 Neck: No JVD, no carotid bruits; normal carotid upstroke Lungs: clear to ausculatation and percussion; no wheezing or rales Chest wall: without tenderness to palpitation Heart: PMI not displaced, RRR, s1 s2 normal, 1/6 systolic murmur, no diastolic murmur, no rubs, gallops, thrills, or heaves Abdomen: soft, nontender; no hepatosplenomehaly, BS+; abdominal aorta nontender and not dilated by palpation. Back: no CVA tenderness Pulses 2+ femoral bruits Musculoskeletal: full range of motion, normal strength, no joint deformities Extremities: no clubbing cyanosis or edema, Homan's sign negative  Neurologic: grossly nonfocal; Cranial nerves grossly wnl Psychologic: Normal mood and affect   ECG (independently read by me): Sinus bradycardia 53 bpm.  Q waves inferiorly, QTc interval 463 ms.  November 2017 ECG (independently read by me): Atrial fibrillation at 57 bpm.  Inferior Q waves.  An small inferolateral Q waves.  QTc interval 418 ms.  06/05/2016 ECG  (independently read by me): Atrial flutter with a rate of 79 bpm with variable block.  QTc interval 467 ms.  November 2017 ECG (independently read by me): Atrial flutter with variable block, ventricular rate at 74.  LVH by voltage criteria in aVL.  Inferior Q waves concordant with prior MI.  May 2017 ECG (independently read by me): Sinus bradycardia at 51 bpm.  Old inferior infarct pattern with prominent inferior lateral Q waves  05/03/2015 ECG (independently read by me): Sinus bradycardia 54 bpm with PVC.  Inferior and lateral Q waves concordant with inferior lateral MI  ECG (independently read by me): Sinus bradycardia 55 bpm.  Inferior Q waves compatible with old inferior MI.  No significant ST segment changes.  January 2015 ECG (independently read by me): Normal sinus rhythm at 75 beats per minute. Old inferior infarction with inferior Q waves. No other ST-T changes  LABS:  BMP Latest Ref Rng & Units 04/19/2017 07/24/2016 05/09/2016  Glucose 65 - 99 mg/dL 155(H) 116(H) 122(H)  BUN 8 - 27 mg/dL _0 Creatinine 0.76 - 1.27 mg/dL 1.12 1.01 1.03  BUN/Creat Ratio 10 - 24 12 - -  Sodium 134 - 144 mmol/L 141 143 143  Potassium 3.5 - 5.2 mmol/L 4.9 5.1 4.9  Chloride 96 - 106 mmol/L 101 106 108  CO2 20 - 29 mmol/L _1 Calcium 8.6 - 10.2 mg/dL 9.3 9.4 9.3   Hepatic Function Latest Ref Rng & Units 04/19/2017 05/09/2016 01/07/2016  Total Protein 6.0 - 8.5 g/dL 6.5 6.2 5.8(L)  Albumin 3.6 - 4.8 g/dL 4.0 4.1 3.6  AST 0 - 40 IU/L _2 ALT 0 - 44 IU/L _3 Alk Phosphatase 39 - 117  IU/L 84 64 65  Total Bilirubin 0.0 - 1.2 mg/dL 0.8 1.0 0.8  Bilirubin, Direct - - - -    CBC Latest Ref Rng & Units 04/19/2017 07/24/2016 05/09/2016  WBC 3.4 - 10.8 x10E3/uL 10.6 7.5 7.1  Hemoglobin 13.0 - 17.7 g/dL 16.6 17.8(H) 17.1  Hematocrit 37.5 - 51.0 % 48.1 53.4(H) 50.6(H)  Platelets 150 - 379 x10E3/uL 182 161 171   Lab Results  Component Value Date   MCV 90 04/19/2017   MCV 92.1  07/24/2016   MCV 92.0 05/09/2016   Lab Results  Component Value Date   TSH 1.330 04/19/2017    BNP No results [name] for: PROBNP  Lipid Panel     Component Value Date/Time   CHOL 129 04/19/2017 1030   TRIG 96 04/19/2017 1030   HDL 35 (L) 04/19/2017 1030   CHOLHDL 3.7 04/19/2017 1030   CHOLHDL 3.7 05/09/2016 0934   VLDL 12 05/09/2016 0934   LDLCALC 75 04/19/2017 1030     RADIOLOGY: No results [name].  IMPRESSION:  1. Coronary artery disease involving native coronary artery of native heart without angina pectoris   2. Hyperlipidemia LDL goal <70   3. Essential hypertension   4. OSA on CPAP   5. SOB (shortness of breath)   6. Claudication ([place])   7. Medication management   8. Typical atrial flutter ([place]); s/p DC cardioversion 07/28/2016   9. Tobacco abuse     ASSESSMENT AND PLAN: Mr. Maxon is a 66 year-old Caucasian male who has CAD and has undergone prior extensive stenting to his RCA and also stenting to his circumflex vesse dating back to 53.  An echo Doppler study in 2013 showed ejection fraction of 45-50%. There was severe inferior, inferolateral and inferoseptal hypokinesis in the basal inferolateral wall segments consistent with scar.  His last cardiac catheterization by John [name] was in September 2013. His last nuclear perfusion study in November 2016 shows a large area of inferior scar without associated ischemia.  The ejection fraction on the nuclear study was reduced at 31%, making the scan, a high-risk study. His echo Doppler study done the same day showed discordant dated with an EF of 50%, but also with inferior hypokinesis.  I suspect his true ejection fraction is somewhere in between.  When he was seen in November 2017 he was in in atrial flutter with variable block, which undoubtedly had contributed to his fatigability.  His ejection fraction remains in the 40-45% range on his repeat echo of 06/02/2016.  He underwent successful DC cardioversion and has been  maintaining sinus rhythm without awareness of recurrent arrhythmia.  Unfortunately, he has continued to smoke cigarettes.  He admits to discomfort in his legs, particularly in the calves.  He did not have any calf swelling, and Homans signs were negative, arguing against potential DVT.  With his extensive tobacco history.  He may very well have lower extremity claudication symptomatology.  He also has experienced some back discomfort and he was unsure if this was heart related.  Presently, he is fasting today and repeat laboratory will be obtained.  I'm scheduling him for follow-up echo Doppler study to reassess LV function and valvular architecture.  I'm scheduling him for lower extremity arterial duplex imaging to assess for potential claudication symptomatology.  Since it is been 2 years since his last nuclear stress test and with his ongoing tobacco use.  I'm also scheduling him for 2 year follow-up Lexiscan Myoview study.  I will contact him regarding the  results of laboratory for adjustments to his medical regimen need to be made.  I will see him in 6 weeks for reevaluation.  Time spent: 25 minutes John Sine, MD, [facility]  04/21/2017 11:28 AM

## 2017-04-19 NOTE — Patient Instructions (Signed)
Medication Instructions:  Your physician recommends that you continue on your current medications as directed. Please refer to the Current Medication list given to you today.  Labwork: TODAY (CBC, CMET, Lipid, TSH)  Testing/Procedures: Your physician has requested that you have an echocardiogram. Echocardiography is a painless test that uses sound waves to create images of your heart. It provides your doctor with information about the size and shape of your heart and how well your heart's chambers and valves are working. This procedure takes approximately one hour. There are no restrictions for this procedure. This will be done at our [facility] location:  [place] has requested that you have a lexiscan myoview. For further information please visit [email or web address]. Please follow instruction sheet, as given.   Your physician has requested that you have a lower extremity arterial duplex. This test is an ultrasound of the arteries in the legs. It looks at arterial blood flow in the legs. Allow one hour for Lower Arterial scans. There are no restrictions or special instructions  Follow-Up: Your physician recommends that you schedule a follow-up appointment in: 6 weeks with Dr. Claiborne Billings.    Any Other Special Instructions Will Be Listed Below (If Applicable).     If you need a refill on your cardiac medications before your next appointment, please call your pharmacy.

## 2017-04-20 ENCOUNTER — Telehealth: Payer: Self-pay | Admitting: Family Medicine

## 2017-04-20 DIAGNOSIS — M545 Low back pain: Principal | ICD-10-CM

## 2017-04-20 DIAGNOSIS — G8929 Other chronic pain: Secondary | ICD-10-CM

## 2017-04-20 DIAGNOSIS — M549 Dorsalgia, unspecified: Secondary | ICD-10-CM

## 2017-04-20 NOTE — Telephone Encounter (Signed)
John [name] with [name]

## 2017-04-20 NOTE — Telephone Encounter (Signed)
Pt called and states back pain is getting worse and would like a referral to see Dr. Weston Settle - Neuro.  (you had recommended ortho?)  Ok with referral? To Whom?

## 2017-04-23 NOTE — Telephone Encounter (Signed)
Pt states that he called and made the apt no need for referral

## 2017-04-23 NOTE — Telephone Encounter (Signed)
Referral placed and pt aware   

## 2017-04-24 ENCOUNTER — Telehealth (HOSPITAL_COMMUNITY): Payer: Self-pay

## 2017-04-24 NOTE — Telephone Encounter (Signed)
Encounter complete. 

## 2017-04-26 ENCOUNTER — Other Ambulatory Visit: Payer: Self-pay

## 2017-04-26 ENCOUNTER — Ambulatory Visit (HOSPITAL_COMMUNITY): Payer: Medicare Other | Attending: Cardiology

## 2017-04-26 DIAGNOSIS — M48062 Spinal stenosis, lumbar region with neurogenic claudication: Secondary | ICD-10-CM | POA: Diagnosis not present

## 2017-04-26 DIAGNOSIS — E119 Type 2 diabetes mellitus without complications: Secondary | ICD-10-CM | POA: Diagnosis not present

## 2017-04-26 DIAGNOSIS — E785 Hyperlipidemia, unspecified: Secondary | ICD-10-CM | POA: Insufficient documentation

## 2017-04-26 DIAGNOSIS — I2510 Atherosclerotic heart disease of native coronary artery without angina pectoris: Secondary | ICD-10-CM | POA: Diagnosis not present

## 2017-04-26 DIAGNOSIS — I10 Essential (primary) hypertension: Secondary | ICD-10-CM | POA: Diagnosis not present

## 2017-04-26 DIAGNOSIS — G4733 Obstructive sleep apnea (adult) (pediatric): Secondary | ICD-10-CM | POA: Diagnosis not present

## 2017-04-26 DIAGNOSIS — R0602 Shortness of breath: Secondary | ICD-10-CM | POA: Insufficient documentation

## 2017-04-27 ENCOUNTER — Ambulatory Visit (HOSPITAL_COMMUNITY)
Admission: RE | Admit: 2017-04-27 | Discharge: 2017-04-27 | Disposition: A | Discharge status: Home / Self Care | Payer: Medicare Other | Source: Ambulatory Visit | Attending: Cardiovascular Disease | Admitting: Cardiovascular Disease

## 2017-04-27 DIAGNOSIS — I4891 Unspecified atrial fibrillation: Secondary | ICD-10-CM | POA: Insufficient documentation

## 2017-04-27 DIAGNOSIS — I10 Essential (primary) hypertension: Secondary | ICD-10-CM | POA: Insufficient documentation

## 2017-04-27 DIAGNOSIS — I4892 Unspecified atrial flutter: Secondary | ICD-10-CM | POA: Insufficient documentation

## 2017-04-27 DIAGNOSIS — R0602 Shortness of breath: Secondary | ICD-10-CM | POA: Diagnosis not present

## 2017-04-27 DIAGNOSIS — R9439 Abnormal result of other cardiovascular function study: Secondary | ICD-10-CM | POA: Insufficient documentation

## 2017-04-27 DIAGNOSIS — J449 Chronic obstructive pulmonary disease, unspecified: Secondary | ICD-10-CM | POA: Insufficient documentation

## 2017-04-27 DIAGNOSIS — I2510 Atherosclerotic heart disease of native coronary artery without angina pectoris: Secondary | ICD-10-CM | POA: Diagnosis not present

## 2017-04-27 DIAGNOSIS — G4733 Obstructive sleep apnea (adult) (pediatric): Secondary | ICD-10-CM | POA: Diagnosis not present

## 2017-04-27 LAB — MYOCARDIAL PERFUSION IMAGING
CHL CUP NUCLEAR SRS: 12
CHL CUP NUCLEAR SSS: 16
LV sys vol: 131 mL
LVDIAVOL: 198 mL (ref 62–150)
Peak HR: 60 {beats}/min
Rest HR: 51 {beats}/min
SDS: 4
TID: 1.26

## 2017-04-27 MED ORDER — TECHNETIUM TC 99M TETROFOSMIN IV KIT
8.4000 | PACK | Freq: Once | INTRAVENOUS | Status: AC | PRN
  Administered 2017-04-27: 8.4 via INTRAVENOUS
  Filled 2017-04-27: qty 9

## 2017-04-27 MED ORDER — REGADENOSON 0.4 MG/5ML IV SOLN
0.4000 mg | Freq: Once | INTRAVENOUS | Status: AC
  Administered 2017-04-27: 0.4 mg via INTRAVENOUS

## 2017-04-27 MED ORDER — TECHNETIUM TC 99M TETROFOSMIN IV KIT
26.9000 | PACK | Freq: Once | INTRAVENOUS | Status: AC | PRN
  Administered 2017-04-27: 26.9 via INTRAVENOUS
  Filled 2017-04-27: qty 27

## 2017-05-02 ENCOUNTER — Other Ambulatory Visit: Payer: Self-pay | Admitting: Cardiovascular Disease

## 2017-05-02 DIAGNOSIS — I739 Peripheral vascular disease, unspecified: Secondary | ICD-10-CM

## 2017-05-05 ENCOUNTER — Other Ambulatory Visit: Payer: Self-pay | Admitting: Cardiovascular Disease

## 2017-05-14 DIAGNOSIS — E119 Type 2 diabetes mellitus without complications: Secondary | ICD-10-CM | POA: Diagnosis not present

## 2017-05-14 DIAGNOSIS — Z794 Long term (current) use of insulin: Secondary | ICD-10-CM | POA: Diagnosis not present

## 2017-05-14 DIAGNOSIS — M48062 Spinal stenosis, lumbar region with neurogenic claudication: Secondary | ICD-10-CM | POA: Diagnosis not present

## 2017-05-14 DIAGNOSIS — I4891 Unspecified atrial fibrillation: Secondary | ICD-10-CM | POA: Diagnosis not present

## 2017-05-15 ENCOUNTER — Ambulatory Visit (HOSPITAL_COMMUNITY)
Admission: RE | Admit: 2017-05-15 | Discharge: 2017-05-15 | Disposition: A | Discharge status: Home / Self Care | Payer: Medicare Other | Source: Ambulatory Visit | Attending: Cardiology | Admitting: Cardiology

## 2017-05-15 DIAGNOSIS — I739 Peripheral vascular disease, unspecified: Secondary | ICD-10-CM | POA: Diagnosis not present

## 2017-05-16 DIAGNOSIS — M48062 Spinal stenosis, lumbar region with neurogenic claudication: Secondary | ICD-10-CM | POA: Diagnosis not present

## 2017-05-16 DIAGNOSIS — M47814 Spondylosis without myelopathy or radiculopathy, thoracic region: Secondary | ICD-10-CM | POA: Diagnosis not present

## 2017-05-22 DIAGNOSIS — M5440 Lumbago with sciatica, unspecified side: Secondary | ICD-10-CM | POA: Diagnosis not present

## 2017-05-24 ENCOUNTER — Other Ambulatory Visit: Payer: Self-pay

## 2017-05-29 ENCOUNTER — Other Ambulatory Visit: Payer: Self-pay | Admitting: Cardiovascular Disease

## 2017-05-30 ENCOUNTER — Ambulatory Visit: Payer: Medicare Other | Admitting: Student

## 2017-06-02 ENCOUNTER — Other Ambulatory Visit: Payer: Self-pay | Admitting: Cardiovascular Disease

## 2017-06-04 ENCOUNTER — Encounter: Payer: Self-pay | Admitting: Family Medicine

## 2017-06-04 ENCOUNTER — Ambulatory Visit (HOSPITAL_COMMUNITY)
Admission: RE | Admit: 2017-06-04 | Discharge: 2017-06-04 | Disposition: A | Discharge status: Home / Self Care | Payer: Medicare Other | Source: Ambulatory Visit | Attending: Family Medicine | Admitting: Family Medicine

## 2017-06-04 ENCOUNTER — Ambulatory Visit (INDEPENDENT_AMBULATORY_CARE_PROVIDER_SITE_OTHER): Payer: Medicare Other | Admitting: Family Medicine

## 2017-06-04 VITALS — BP 100/60 | HR 52 | Temp 98.2°F | Resp 16 | Ht 72.0 in | Wt 224.0 lb

## 2017-06-04 DIAGNOSIS — R0989 Other specified symptoms and signs involving the circulatory and respiratory systems: Secondary | ICD-10-CM

## 2017-06-04 DIAGNOSIS — R0602 Shortness of breath: Secondary | ICD-10-CM | POA: Diagnosis not present

## 2017-06-04 DIAGNOSIS — K219 Gastro-esophageal reflux disease without esophagitis: Secondary | ICD-10-CM

## 2017-06-04 DIAGNOSIS — R0609 Other forms of dyspnea: Secondary | ICD-10-CM

## 2017-06-04 DIAGNOSIS — F458 Other somatoform disorders: Secondary | ICD-10-CM | POA: Diagnosis not present

## 2017-06-04 DIAGNOSIS — I2510 Atherosclerotic heart disease of native coronary artery without angina pectoris: Secondary | ICD-10-CM

## 2017-06-04 DIAGNOSIS — I517 Cardiomegaly: Secondary | ICD-10-CM | POA: Diagnosis not present

## 2017-06-04 DIAGNOSIS — I878 Other specified disorders of veins: Secondary | ICD-10-CM | POA: Diagnosis not present

## 2017-06-04 DIAGNOSIS — I5020 Unspecified systolic (congestive) heart failure: Secondary | ICD-10-CM | POA: Diagnosis not present

## 2017-06-04 DIAGNOSIS — Z720 Tobacco use: Secondary | ICD-10-CM | POA: Diagnosis not present

## 2017-06-04 NOTE — Progress Notes (Signed)
Subjective:    Patient ID: John [name], male    DOB: 12-25-1950, 66 y.o.   MRN: [id number]  HPI Patient presents with several months of dyspnea on exertion.  He actually went to see his cardiologist in November.  Stress test revealed no evidence of ischemia however it did demonstrate a reduced ejection fraction of 35-40%.  Follow-up echocardiogram showed an improved ejection fraction of 40-45% but still suppressed.  Patient states that he is easily becoming winded.  He denies any chest pain.  However his stamina and exercise tolerance is much less.  Furthermore he is also experiencing a globus sensation in the left side of his throat.  This comes and goes but seems to be associated with reflux.  He reports worsening acid reflux.  He denies any hemoptysis.  He denies any purulent sputum.  He does have a long-standing history of tobacco abuse.  He is smoked for more than 40 years.  He is also been on amiodarone for many years.  He denies any fevers or chills or weight loss or hemoptysis.  I perform pulmonary function test today in the office.  FEV1 is 3.36 L or 91% of predicted.  FVC is 4.08 L or 87% of predicted.  The FEV1 to FVC ratio is 82%.  Therefore there is no evidence of COPD or restrictive lung disease as one would expect from pulmonary fibrosis due to amiodarone. Past Medical History:  Diagnosis Date  . Coronary artery disease   . Diabetes mellitus   . GERD (gastroesophageal reflux disease)   . History of nuclear stress test 04/04/2011   lexiscan; mod-large in size fixed inferolateral defect (scar); non-diagnostic for ischemia; low risk scan   . Hyperlipidemia   . Hypertension   . Left foot drop    r/t past disk srugery - uses Kevlar brace  . Myocardial infarction ([place])    posterior MI  . Shortness of breath   . Sleep apnea    on CPAP; 04/28/2007 split-night - AHI during total sleep 44.43/hr and REM 72.56/hr   Past Surgical History:  Procedure Laterality Date  . BACK SURGERY   1985  . CARDIAC CATHETERIZATION  2010   6 stents total  . CARDIAC CATHETERIZATION  01/2000   percutaneous transluminal coronary balloon angioplasty of mid RCA stenotic lesion  . CARDIAC CATHETERIZATION  06/2006   no stenting; ischemic cardiomyopathy, EF 40-45%  . CARDIOVERSION N/A 07/28/2016   Procedure: CARDIOVERSION;  Surgeon: Troy Sine, MD;  Location: [facility];  Service: Cardiovascular;  Laterality: N/A;  . CORONARY ANGIOPLASTY  09/1998   mid-distal RCA balloon dilatation, 4.5 & 5.0 stents   . CORONARY ANGIOPLASTY WITH STENT PLACEMENT  03/1994   angioplasty & stenting (non-DES) of circumflex/prox ramus intermedius  . CORONARY ANGIOPLASTY WITH STENT PLACEMENT  10/1994   large iliac [id number] stent to RCA  . CORONARY ANGIOPLASTY WITH STENT PLACEMENT  12/2002   4.103mm stents x2 of RCA  . CORONARY ANGIOPLASTY WITH STENT PLACEMENT  01/2005   cutting balloon arthrectomy of distal RCA & Cypher DES 3.5x13; cutting balloon arthrectomy of mid RCA with Cypher DES 3.5x18  . CORONARY ANGIOPLASTY WITH STENT PLACEMENT  11/2008   stenting of mid RCA with 4.0x79mm driver, non-DES  . LEFT HEART CATHETERIZATION WITH CORONARY ANGIOGRAM N/A 02/27/2012   Procedure: LEFT HEART CATHETERIZATION WITH CORONARY ANGIOGRAM;  Surgeon: Lorretta Harp, MD;  Location: [facility] CATH LAB;  Service: Cardiovascular;  Laterality: N/A;  . TRANSTHORACIC ECHOCARDIOGRAM  07/29/2010   EF  50=55%, mod inf wall hypokinesis & mild post wall hypokinesis; LA mild-mod dilated; mild mitral annular calcif & mild MR; mild TR & elevated RV systolic pressure; AV mildly sclerotic; mild aortic root dilatation    Current Outpatient Medications on File Prior to Visit  Medication Sig Dispense Refill  . amiodarone (PACERONE) 200 MG tablet Take 1 tablet (200 mg total) by mouth 2 (two) times daily. 60 tablet 11  . aspirin 81 MG tablet Take 81 mg by mouth daily.    . benazepril (LOTENSIN) 10 MG tablet TAKE 1 TABLET BY MOUTH EVERY DAY 30 tablet 6  .  BYSTOLIC 5 MG tablet TAKE 1 TABLET BY MOUTH EVERY DAY 30 tablet 9  . ELIQUIS 5 MG TABS tablet TAKE 1 TABLET BY MOUTH TWICE A DAY 180 tablet 1  . glucose blood (ONE TOUCH ULTRA TEST) test strip USE 1 STRIP 3 TIMES DAILY - USE AS INSTRUCTED DX: E11.9 100 each 2  . Insulin Pen Needle (NOVOFINE) 32G X 6 MM MISC 1 each by Other route daily. 100 each 3  . LEVEMIR FLEXTOUCH 100 UNIT/ML Pen INJECT 26 UNITS INTO THE SKIN AT BEDTIME. ICD E11.65 15 pen 3  . montelukast (SINGULAIR) 10 MG tablet TAKE 1 TABLET BY MOUTH EVERY DAY 30 tablet 11  . nitroGLYCERIN (NITROSTAT) 0.4 MG SL tablet Place 0.4 mg under the tongue every 5 (five) minutes as needed for chest pain.    . rosuvastatin (CRESTOR) 20 MG tablet Take 1 tablet (20 mg total) by mouth daily. 90 tablet 3   No current facility-administered medications on file prior to visit.    Allergies  Allergen Reactions  . Fish Allergy Itching  . Omega-3 Fatty Acids Itching    hives  . Fish Oil Itching and Rash   Social History   Socioeconomic History  . Marital status: Married    Spouse name: Not on file  . Number of children: 3  . Years of education: Not on file  . Highest education level: Not on file  Social Needs  . Financial resource strain: Not on file  . Food insecurity - worry: Not on file  . Food insecurity - inability: Not on file  . Transportation needs - medical: Not on file  . Transportation needs - non-medical: Not on file  Occupational History  . 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 [employer]  Tobacco Use  . Smoking status: Current Some Day Smoker    Packs/day: 1.00    Years: 50.00    Pack years: 50.00    Types: Cigarettes  . Smokeless tobacco: Never Used  Substance and Sexual Activity  . Alcohol use: Yes    Alcohol/week: 0.0 oz    Comment: occasionally  . Drug use: No  . Sexual activity: Yes  Other Topics Concern  . Not on file  Social History Narrative  . Not on file      Review of  Systems  All other systems reviewed and are negative.      Objective:   Physical Exam  Constitutional: He appears well-developed and well-nourished. No distress.  Neck: Neck supple. No JVD present.  Cardiovascular: Normal rate, regular rhythm and normal heart sounds.  Pulmonary/Chest: Effort normal and breath sounds normal. No stridor. No respiratory distress. He has no wheezes. He has no rales.  Abdominal: Soft. Bowel sounds are normal. He exhibits no distension and no mass. There is no tenderness. There is no rebound and  no guarding.  Musculoskeletal: He exhibits no edema.  Skin: He is not diaphoretic.  Vitals reviewed.         Assessment & Plan:  Dyspnea on exertion - Plan: DG Chest 2 View  Tobacco abuse  Globus sensation  GERD without esophagitis  Systolic congestive heart failure, NYHA class 2, unspecified congestive heart failure chronicity (HCC)  Differential diagnosis for his dyspnea on exertion includes congestive heart failure with a suppressed ejection fraction, COPD, pulmonary fibrosis secondary to amiodarone, laryngo-esophageal reflux causing shortness of breath as well as globus sensation, or underlying pulmonary mass due to smoking.  Pulmonary function test today in the office are normal.  This makes COPD and pulmonary fibrosis less likely.  I will obtain a chest x-ray to evaluate for underlying interstitial lung disease or pulmonary mass.  If chest x-ray is clear, given his normal pulmonary function test, I will start the patient on the medication for acid reflux to see if this is contributing to his shortness of breath as well as globus sensation.  I would also await the results of his cardiology consultation he has later this week to discuss treatment for his reduced ejection fraction.  He may be a good candidate to discontinue benazepril and replaced with Entresto.  Await the results of his chest x-ray

## 2017-06-06 ENCOUNTER — Encounter: Payer: Self-pay | Admitting: Family Medicine

## 2017-06-07 ENCOUNTER — Ambulatory Visit (INDEPENDENT_AMBULATORY_CARE_PROVIDER_SITE_OTHER): Payer: Medicare Other | Admitting: Cardiovascular Disease

## 2017-06-07 VITALS — BP 126/74 | HR 95 | Ht 72.0 in | Wt 226.0 lb

## 2017-06-07 DIAGNOSIS — I10 Essential (primary) hypertension: Secondary | ICD-10-CM | POA: Diagnosis not present

## 2017-06-07 DIAGNOSIS — G4733 Obstructive sleep apnea (adult) (pediatric): Secondary | ICD-10-CM | POA: Diagnosis not present

## 2017-06-07 DIAGNOSIS — Z720 Tobacco use: Secondary | ICD-10-CM

## 2017-06-07 DIAGNOSIS — I2510 Atherosclerotic heart disease of native coronary artery without angina pectoris: Secondary | ICD-10-CM

## 2017-06-07 DIAGNOSIS — Z7901 Long term (current) use of anticoagulants: Secondary | ICD-10-CM

## 2017-06-07 DIAGNOSIS — E785 Hyperlipidemia, unspecified: Secondary | ICD-10-CM | POA: Diagnosis not present

## 2017-06-07 DIAGNOSIS — Z9989 Dependence on other enabling machines and devices: Secondary | ICD-10-CM

## 2017-06-07 DIAGNOSIS — R0602 Shortness of breath: Secondary | ICD-10-CM

## 2017-06-07 NOTE — Progress Notes (Signed)
Patient ID: John [name], male   DOB: 1950-11-22, 66 y.o.   MRN: [id number]     HPI:  John [name] is a 66 y.o. male who is a former patient of Dr. Rollene Fare. He presents for a 13 month cardiology followup evaluation.   John [name] has known CAD and underwent multiple percutaneous cardiac interventions with stenting to circumflex and RCA. His last cardiac catheterization was done by [name] in September 2013 showed his RCA stented patent but he had 40% narrowing in the very distal aspect. He has a history of hypertension, as well as long-standing tobacco use with mild polycythemia. I had seen him in a sleep clinic after he obtained a new CPAP machine.  He has severe sleep apnea with an AHI on his initial diagnostic study of 44 per hour overall and 72.6 per hour with REM sleep.  A download last year  showed excellent benefit from CPAP with an AHI of 3.1.  His initial stent to his RCA was in 1996 was a Cypher PS [id number] sent. In 1999 he underwent non-DES stenting to circumflex after he presented with a posterior MI. He had additional 4-5 stents placed in his RCA.  He has a history of prior back surgery and has had a left foot drop.  He has significant obstructive sleep apnea and continues to utilize CPAP therapy.  When I saw him in the past despite CPAP therapy he complained of fatigue and residual daytime sleepiness and I suggested the possibility of Nuvigil or Provigil if necessary.    Remotely he was seen  by [name] complaints of  atypical chest pain.  There was some concern that this may be reflux and pantoprazole was discontinued and replaced with dexilant. However, it was felt that the patient may require another stress test since his last evaluation was in 2012.  He also had had recent issues with some weight gain and shortness of breath and his intermittent Lasix dose changed to 40 mg daily.  On 05/06/2015 he underwent a nuclear stress test which was interpreted as a high risk study  due to an ejection fraction of 31%.  There was a large fixed inferior and inferoseptal wall defect suggestive of scar and evidence for inferoseptal akinesis.  There was no ischemia.   On his prior nuclear study the EF in October 2012 was 38%.  An echo Doppler study done the same day however, showed discordant data and his ejection fraction was 50-55%.  Hypokinesis of the inferior inferolateral wall was noted.  There was grade 1 diastolic dysfunction.  There was mild aortic root dilatation at 41 mm.  His left atrium was severely dilated.  His right atrium was severely dilated.  There was trivial TR.  Of note, his last echo Doppler study had shown an EF of 45-50%.  When I  saw him in November 2017  he stated that he has been using CPAP.  However, for the past month he has noticed significant increased fatigue.  He was also found to have elevated sugars and his insulin was adjusted.  He admits to development of some trace ankle swelling, left greater than right.  He denied any episodes of chest pain.  He was unaware of spells of tachycardia.  He denied presyncope or syncope.  During that evaluation, he was found to be in atrial flutter with variable block at 74 bpm of questionable duration.  At that time, I recommended that he discontinue Plavix and started him on eliquis  5 mg and further titrated his Bystolic to 10 mg.  His [id number] is at least 4 and with his CAD he was advised to continue aspirin 81 mg.  He underwent an echo Doppler study on 06/02/2016 which showed an EF of 40-45% with moderate diffuse hypokinesis.  While they are, mild aortic sclerosis and increased atrial septal thickness consistent with lipomatous hypertrophy.  I obtained a download of his CPAP unit from 02/09/2016 through 05/08/2016 and he was continuing to meet Medicare compliance with 91% of days of usage and 79% with usage greater than 4 hours.  He was averaging 5 hours and 55 minutes per night.  AHI was 3.7.  However, he had a very  large leak.  I have recommended that he get a new mask from his DME company.    He underwent successful cardioversion for atrial flutter.  He is unaware of any recurrent arrhythmia.  He continues to be on amiodarone, and Bystolic.  He is on eliquis for anticoagulation.  Since I last saw him, he has had difficulty with pain in the center of his back.  He has seen [name] and had undergone an MRI and was told of having disc disease.  He has noticed some shortness of breath.  He had undergone PFTs by [name] and was not found to have significant COPD or restrictive lung disease. [name]  He continues to be on amiodarone.  He has been on [id number] mg  And bystolic for hypertension and rosuvastatin 20 mg for hyperlipidemia.  Unfortunately he is still smoking at least a half a pack per day and is smoked for over 55 years.  Continues to use CPAP with 100% compliance.  He is diabetic on insulin.  He is diabetic on insulin.  He is retired as a Chartered certified accountant for [employer]. He had experienced some back discomfort.  He denies any classic exertional chest tightness.    He underwent an echo Doppler study on 04/26/2017 which showed an EF of 40-45% with grade 1 diastolic dysfunction.  There was borderline aortic root dilatation.  PA pressure was 31 mm.  He had mild LA and mild to moderate RA .  A follow-up nuclear perfusion study on 04/27/2017 continued to be low risk and showed findings consistent with prior inferolateral myocardial infarction.  The EF on the nuclear study was less than the echo at 34%.  He presents for evaluation.  Past Medical History:  Diagnosis Date  . Coronary artery disease   . Diabetes mellitus   . GERD (gastroesophageal reflux disease)   . History of nuclear stress test 04/04/2011   lexiscan; mod-large in size fixed inferolateral defect (scar); non-diagnostic for ischemia; low risk scan   . Hyperlipidemia   . Hypertension   . Left foot drop    r/t past disk srugery - uses Kevlar  brace  . Myocardial infarction ([place])    posterior MI  . Shortness of breath   . Sleep apnea    on CPAP; 04/28/2007 split-night - AHI during total sleep 44.43/hr and REM 72.56/hr    Past Surgical History:  Procedure Laterality Date  . BACK SURGERY  1985  . CARDIAC CATHETERIZATION  2010   6 stents total  . CARDIAC CATHETERIZATION  01/2000   percutaneous transluminal coronary balloon angioplasty of mid RCA stenotic lesion  . CARDIAC CATHETERIZATION  06/2006   no stenting; ischemic cardiomyopathy, EF 40-45%  . CARDIOVERSION N/A 07/28/2016   Procedure: CARDIOVERSION;  Surgeon: Troy Sine, MD;  Location: [facility];  Service: Cardiovascular;  Laterality: N/A;  . CORONARY ANGIOPLASTY  09/1998   mid-distal RCA balloon dilatation, 4.5 & 5.0 stents   . CORONARY ANGIOPLASTY WITH STENT PLACEMENT  03/1994   angioplasty & stenting (non-DES) of circumflex/prox ramus intermedius  . CORONARY ANGIOPLASTY WITH STENT PLACEMENT  10/1994   large iliac [id number] stent to RCA  . CORONARY ANGIOPLASTY WITH STENT PLACEMENT  12/2002   4.3m stents x2 of RCA  . CORONARY ANGIOPLASTY WITH STENT PLACEMENT  01/2005   cutting balloon arthrectomy of distal RCA & Cypher DES 3.5x13; cutting balloon arthrectomy of mid RCA with Cypher DES 3.5x18  . CORONARY ANGIOPLASTY WITH STENT PLACEMENT  11/2008   stenting of mid RCA with 4.0x178mdriver, non-DES  . LEFT HEART CATHETERIZATION WITH CORONARY ANGIOGRAM N/A 02/27/2012   Procedure: LEFT HEART CATHETERIZATION WITH CORONARY ANGIOGRAM;  Surgeon: JoLorretta HarpMD;  [facility];  Service: Cardiovascular;  Laterality: N/A;  . TRANSTHORACIC ECHOCARDIOGRAM  07/29/2010   EF 50=55%, mod inf wall hypokinesis & mild post wall hypokinesis; LA mild-mod dilated; mild mitral annular calcif & mild MR; mild TR & elevated RV systolic pressure; AV mildly sclerotic; mild aortic root dilatation     Allergies  Allergen Reactions  . Fish Allergy Itching  . Omega-3 Fatty Acids Itching      hives  . Fish Oil Itching and Rash    Current Outpatient Medications  Medication Sig Dispense Refill  . amiodarone (PACERONE) 200 MG tablet Take 1 tablet (200 mg total) by mouth 2 (two) times daily. 60 tablet 11  . aspirin 81 MG tablet Take 81 mg by mouth daily.    . benazepril (LOTENSIN) 10 MG tablet TAKE 1 TABLET BY MOUTH EVERY DAY 30 tablet 6  . BYSTOLIC 5 MG tablet TAKE 1 TABLET BY MOUTH EVERY DAY 30 tablet 9  . ELIQUIS 5 MG TABS tablet TAKE 1 TABLET BY MOUTH TWICE A DAY 180 tablet 1  . glucose blood (ONE TOUCH ULTRA TEST) test strip USE 1 STRIP 3 TIMES DAILY - USE AS INSTRUCTED DX: E11.9 100 each 2  . Insulin Pen Needle (NOVOFINE) 32G X 6 MM MISC 1 each by Other route daily. 100 each 3  . LEVEMIR FLEXTOUCH 100 UNIT/ML Pen INJECT 26 UNITS INTO THE SKIN AT BEDTIME. ICD E11.65 15 pen 3  . montelukast (SINGULAIR) 10 MG tablet TAKE 1 TABLET BY MOUTH EVERY DAY 30 tablet 11  . nitroGLYCERIN (NITROSTAT) 0.4 MG SL tablet Place 0.4 mg under the tongue every 5 (five) minutes as needed for chest pain.    . rosuvastatin (CRESTOR) 20 MG tablet Take 1 tablet (20 mg total) by mouth daily. 90 tablet 3   No current facility-administered medications for this visit.     Social History   Socioeconomic History  . Marital status: Married    Spouse name: Not on file  . Number of children: 3  . Years of education: Not on file  . Highest education level: Not on file  Social Needs  . Financial resource strain: Not on file  . Food insecurity - worry: Not on file  . Food insecurity - inability: Not on file  . Transportation needs - medical: Not on file  . Transportation needs - non-medical: Not on file  Occupational History  . Occupation: [place] [place] [employer]  Tobacco Use  . Smoking status: Current Some Day Smoker  Packs/day: 1.00    Years: 50.00    Pack years: 50.00    Types: Cigarettes  . Smokeless tobacco: Never Used  Substance and Sexual  Activity  . Alcohol use: Yes    Alcohol/week: 0.0 oz    Comment: occasionally  . Drug use: No  . Sexual activity: Yes  Other Topics Concern  . Not on file  Social History Narrative  . Not on file   Social history is known that he works as a Freight forwarder for [employer]. There is a long-standing tobacco history. He rarely drinks alcohol.  Family History  Problem Relation Age of Onset  . Heart attack Father    ROS General: Negative; No fevers, chills, or night sweats; Positive for fatigue  HEENT: Positive for loss of hearing in his right ear Pulmonary: Negative; No cough, wheezing, shortness of breath, hemoptysis Cardiovascular: see history of present illness Positive for possible claudication GI: Negative; No nausea, vomiting, diarrhea, or abdominal pain GU: Negative; No dysuria, hematuria, or difficulty voiding Musculoskeletal: Negative; no myalgias, joint pain, or weakness Hematologic/Oncology: Negative; no easy bruising, bleeding Endocrine: Negative; no heat/cold intolerance; no diabetes Neuro: Negative; no changes in balance, headaches Skin: Negative; No rashes or skin lesions Psychiatric: Negative; No behavioral problems, depression Sleep: Positive for obstructive sleep apnea.  Mild residual daytime sleepiness; no bruxism, restless legs, hypnogognic hallucinations, no cataplexy Other comprehensive 14 point system review is negative.   PE BP 126/74   Pulse 95   Ht 6' (1.829 m)   Wt 226 lb (102.5 kg)   BMI 30.65 kg/m    Repeat blood pressure by me was 126/70  Wt Readings from Last 3 Encounters:  06/07/17 226 lb (102.5 kg)  06/04/17 224 lb (101.6 kg)  04/27/17 228 lb (103.4 kg)   General: Alert, oriented, no distress.  Skin: normal turgor, no rashes, warm and dry HEENT: Normocephalic, atraumatic. Pupils equal round and reactive to light; sclera anicteric; extraocular muscles intact;  Nose without nasal septal hypertrophy Mouth/Parynx benign; Mallinpatti scale  3 Neck: No JVD, no carotid bruits; normal carotid upstroke Lungs: Decreased breath sounds without wheezing. Chest wall: without tenderness to palpitation Heart: PMI not displaced, RRR, s1 s2 normal, 1/6 systolic murmur, no diastolic murmur, no rubs, gallops, thrills, or heaves Abdomen: soft, nontender; no hepatosplenomehaly, BS+; abdominal aorta nontender and not dilated by palpation. Back: no CVA tenderness Pulses 2+ Musculoskeletal: full range of motion, normal strength, no joint deformities Extremities: no clubbing cyanosis or edema, Homan's sign negative  Neurologic: grossly nonfocal; Cranial nerves grossly wnl Psychologic: Normal mood and affect   ECG (independently read by me): Sinus bradycardia 53 bpm.  Q waves inferiorly, QTc interval 463 ms.  November 2017 ECG (independently read by me): Atrial fibrillation at 57 bpm.  Inferior Q waves.  An small inferolateral Q waves.  QTc interval 418 ms.  06/05/2016 ECG (independently read by me): Atrial flutter with a rate of 79 bpm with variable block.  QTc interval 467 ms.  November 2017 ECG (independently read by me): Atrial flutter with variable block, ventricular rate at 74.  LVH by voltage criteria in aVL.  Inferior Q waves concordant with prior MI.  May 2017 ECG (independently read by me): Sinus bradycardia at 51 bpm.  Old inferior infarct pattern with prominent inferior lateral Q waves  05/03/2015 ECG (independently read by me): Sinus bradycardia 54 bpm with PVC.  Inferior and lateral Q waves concordant with inferior lateral MI  ECG (independently read by me): Sinus bradycardia 55  bpm.  Inferior Q waves compatible with old inferior MI.  No significant ST segment changes.  January 2015 ECG (independently read by me): Normal sinus rhythm at 75 beats per minute. Old inferior infarction with inferior Q waves. No other ST-T changes  LABS:  BMP Latest Ref Rng & Units 04/19/2017 07/24/2016 05/09/2016  Glucose 65 - 99 mg/dL 155(H) 116(H)  122(H)  BUN 8 - 27 mg/dL _0 Creatinine 0.76 - 1.27 mg/dL 1.12 1.01 1.03  BUN/Creat Ratio 10 - 24 12 - -  Sodium 134 - 144 mmol/L 141 143 143  Potassium 3.5 - 5.2 mmol/L 4.9 5.1 4.9  Chloride 96 - 106 mmol/L 101 106 108  CO2 20 - 29 mmol/L _1 Calcium 8.6 - 10.2 mg/dL 9.3 9.4 9.3   Hepatic Function Latest Ref Rng & Units 04/19/2017 05/09/2016 01/07/2016  Total Protein 6.0 - 8.5 g/dL 6.5 6.2 5.8(L)  Albumin 3.6 - 4.8 g/dL 4.0 4.1 3.6  AST 0 - 40 IU/L _2 ALT 0 - 44 IU/L _3 Alk Phosphatase 39 - 117 IU/L 84 64 65  Total Bilirubin 0.0 - 1.2 mg/dL 0.8 1.0 0.8  Bilirubin, Direct - - - -    CBC Latest Ref Rng & Units 04/19/2017 07/24/2016 05/09/2016  WBC 3.4 - 10.8 x10E3/uL 10.6 7.5 7.1  Hemoglobin 13.0 - 17.7 g/dL 16.6 17.8(H) 17.1  Hematocrit 37.5 - 51.0 % 48.1 53.4(H) 50.6(H)  Platelets 150 - 379 x10E3/uL 182 161 171   Lab Results  Component Value Date   MCV 90 04/19/2017   MCV 92.1 07/24/2016   MCV 92.0 05/09/2016   Lab Results  Component Value Date   TSH 1.330 04/19/2017    BNP No results found for: PROBNP  Lipid Panel     Component Value Date/Time   CHOL 129 04/19/2017 1030   TRIG 96 04/19/2017 1030   HDL 35 (L) 04/19/2017 1030   CHOLHDL 3.7 04/19/2017 1030   CHOLHDL 3.7 05/09/2016 0934   VLDL 12 05/09/2016 0934   LDLCALC 75 04/19/2017 1030     RADIOLOGY: No results found.  IMPRESSION:  1. Essential hypertension   2. Coronary artery disease involving native coronary artery of native heart without angina pectoris   3. SOB (shortness of breath)   4. Tobacco abuse   5. Hyperlipidemia LDL goal <70   6. OSA on CPAP   7. Anticoagulation adequate     ASSESSMENT AND PLAN: John [name] is a 66 year-old Caucasian male who has CAD and has undergone prior extensive stenting to his RCA and also stenting to his circumflex vesse dating back to 63.  An echo Doppler study in 2013 showed ejection fraction of 45-50%. There was severe inferior,  inferolateral and inferoseptal hypokinesis in the basal inferolateral wall segments consistent with scar.  His last cardiac catheterization by [name] was in September 2013. His last nuclear perfusion study in November 2016 showed a large area of inferior scar without associated ischemia.  The ejection fraction on the nuclear study was reduced at 31%, making the scan, a high-risk study. His echo Doppler study done the same day showed discordant dated with an EF of 50%, but also with inferior hypokinesis.  I reviewed with him his most recent studies, which again showed discordance between the nuclear ejection fraction and echo Doppler study assessment of LV function.  He continues to have findings consistent with inferolateral myocardial infarction.  On his most recent echo ejection  fraction was 40-45% and it was grade 1 diastolic dysfunction.  He did not have ischemia on his nuclear study.  I again discussed with him the importance of complete smoking cessation.  I suspect this is contributing to his dyspnea.  His back pain, most likely is due to his disc disease.  I reviewed his pulmonary function studies which argue against any amiodarone toxicity.  He is maintaining sinus rhythm.  He continues to be on anticoagulation with eliquis and denies bleeding.  He is diabetic on insulin.  Blood pressure today is stable on benazepril and Bystolic.  He continues to be on rosuvastatin for hyperlipidemia with target LDL less than 70. .  I will see him in 6 months for reevaluation.   Time spent: 25 minutes Troy Sine, MD, [facility]  06/09/2017 10:35 AM

## 2017-06-07 NOTE — Patient Instructions (Signed)
Your physician recommends that you continue on your current medications as directed. Please refer to the Current Medication list given to you today.  Your physician wants you to follow-up in:   [place] will receive a reminder letter in the mail two months in advance. If you don't receive a letter, please call our office to schedule the follow-up appointment.

## 2017-06-09 ENCOUNTER — Encounter: Payer: Self-pay | Admitting: Cardiovascular Disease

## 2017-06-20 ENCOUNTER — Encounter: Payer: Self-pay | Admitting: Physician Assistant

## 2017-06-20 ENCOUNTER — Ambulatory Visit (INDEPENDENT_AMBULATORY_CARE_PROVIDER_SITE_OTHER): Payer: Medicare Other | Admitting: Physician Assistant

## 2017-06-20 ENCOUNTER — Other Ambulatory Visit: Payer: Self-pay

## 2017-06-20 VITALS — BP 130/88 | HR 51 | Temp 97.6°F | Resp 14 | Wt 227.0 lb

## 2017-06-20 DIAGNOSIS — IMO0001 Reserved for inherently not codable concepts without codable children: Secondary | ICD-10-CM

## 2017-06-20 DIAGNOSIS — E119 Type 2 diabetes mellitus without complications: Secondary | ICD-10-CM | POA: Diagnosis not present

## 2017-06-20 DIAGNOSIS — Z794 Long term (current) use of insulin: Secondary | ICD-10-CM | POA: Diagnosis not present

## 2017-06-20 NOTE — Progress Notes (Signed)
Patient ID: John [name] MRN: [id number], DOB: [birth year], 67 y.o. Date of Encounter: 06/20/2017, 9:52 AM    Chief Complaint:  Chief Complaint  Patient presents with  . Hyperglycemia    Blood sugar running 224     HPI: 67 y.o. year old male presents with above.   He reports that about 10 days ago his blood sugar started running higher than usual.   Reports that he had been giving 26 units of insulin daily for years.   States that he has recently been increasing his insulin because of his high blood sugars.   Last night administered 34 units and this morning blood sugar was still reading high at 224.   Does not understand this sudden change.   States that the only thing he can think of that may be contributing is that he does eat a lot of grapes. Says that he has cut out the [place].  He has decreased from 2 pieces of toast to just one piece of toast.  He is eating eggs instead of cereal or oatmeal.  Has had no Dr. Malachi Bonds for 2 weeks.  Is drinking water. Asked about the holidays and if there had been a lot of sweets around and a lot of diet changes with the holidays.  He states that he had no change in his diet during the holiday. Later in the conversation he does state "I am definitely not getting the exercise I was getting-- about 2 or 3 months ago"  He asked what else could be making his sugar go up.  He doesn't understand why it is suddenly changing. Discussed with him some of the core defects that occur with diabetes and the fact that this may just be "natural progression of the disease" process. I discussed that the main other factors that can sometimes raise people's sugar are-- if they have used any recent prednisone or if they have any infection.  He reports that he has not been on any prednisone.  He reports that he has been having no signs or symptoms of any infection.  Temperature today is 97.6 so no indication of infection there.     Home Meds:     Outpatient Medications Prior to Visit  Medication Sig Dispense Refill  . amiodarone (PACERONE) 200 MG tablet Take 1 tablet (200 mg total) by mouth 2 (two) times daily. 60 tablet 11  . aspirin 81 MG tablet Take 81 mg by mouth daily.    . benazepril (LOTENSIN) 10 MG tablet TAKE 1 TABLET BY MOUTH EVERY DAY 30 tablet 6  . BYSTOLIC 5 MG tablet TAKE 1 TABLET BY MOUTH EVERY DAY 30 tablet 9  . ELIQUIS 5 MG TABS tablet TAKE 1 TABLET BY MOUTH TWICE A DAY 180 tablet 1  . glucose blood (ONE TOUCH ULTRA TEST) test strip USE 1 STRIP 3 TIMES DAILY - USE AS INSTRUCTED DX: E11.9 100 each 2  . Insulin Pen Needle (NOVOFINE) 32G X 6 MM MISC 1 each by Other route daily. 100 each 3  . LEVEMIR FLEXTOUCH 100 UNIT/ML Pen INJECT 26 UNITS INTO THE SKIN AT BEDTIME. ICD E11.65 (Patient taking differently: INJECT 26 UNITS INTO THE SKIN AT BEDTIME. ICD E11.65/PATIENT INJECTING 34 UNITS AT BEDTIME) 15 pen 3  . montelukast (SINGULAIR) 10 MG tablet TAKE 1 TABLET BY MOUTH EVERY DAY 30 tablet 11  . nitroGLYCERIN (NITROSTAT) 0.4 MG SL tablet Place 0.4 mg under the tongue every 5 (five) minutes as needed for  chest pain.    . rosuvastatin (CRESTOR) 20 MG tablet Take 1 tablet (20 mg total) by mouth daily. 90 tablet 3   No facility-administered medications prior to visit.     Allergies:  Allergies  Allergen Reactions  . Fish Allergy Itching  . Omega-3 Fatty Acids Itching    hives  . Fish Oil Itching and Rash      Review of Systems: See HPI for pertinent ROS. All other ROS negative.    Physical Exam: Blood pressure 130/88, pulse (!) 51, temperature 97.6 F (36.4 C), temperature source Oral, resp. rate 14, weight 103 kg (227 lb), SpO2 98 %., Body mass index is 30.79 kg/m. General:  WNWD WM. Appears in no acute distress. Neck: Supple. No thyromegaly. No lymphadenopathy. Lungs: Clear bilaterally to auscultation without wheezes, rales, or rhonchi. Breathing is unlabored. Heart: Regular rhythm. I/VI murmur Msk:  Strength  and tone normal for age. Extremities/Skin: Warm and dry.  Neuro: Alert and oriented X 3. Moves all extremities spontaneously. Gait is normal. CNII-XII grossly in tact. Psych:  Responds to questions appropriately with a normal affect.     ASSESSMENT AND PLAN:  67 y.o. year old male with  1. IDDM (insulin dependent diabetes mellitus) ([place]) Evan/1/18 cardiology check TSH, FLP, CBC, CME T.  He has had no recent A1c or microalbumin. Today will check glucose and A1c to make sure these are consistent with his home readings to make sure that there is not a problem with his meter being inaccurate.  Last microalbumin was 12/2015 so will check that to update that. If our lab results are consistent with his recent findings then he is to increase his exercise and continue strict diet and then will increase insulin by 1-2 units daily until blood sugar becomes controlled. - COMPLETE METABOLIC PANEL WITH GFR - Hemoglobin A1c - Microalbumin, urine   Signed, [address] [place], [place], [facility] 06/20/2017 9:52 AM

## 2017-06-21 LAB — COMPLETE METABOLIC PANEL WITH GFR
AG Ratio: 1.8 (calc) (ref 1.0–2.5)
ALKALINE PHOSPHATASE (APISO): 79 U/L (ref 40–115)
ALT: 28 U/L (ref 9–46)
AST: 22 U/L (ref 10–35)
Albumin: 4 g/dL (ref 3.6–5.1)
BUN: 17 mg/dL (ref 7–25)
CALCIUM: 9.5 mg/dL (ref 8.6–10.3)
CO2: 27 mmol/L (ref 20–32)
CREATININE: 1.16 mg/dL (ref 0.70–1.25)
Chloride: 104 mmol/L (ref 98–110)
GFR, EST NON AFRICAN AMERICAN: 64 mL/min/{1.73_m2} (ref >=60)
GFR, Est African American: 75 mL/min/{1.73_m2} (ref >=60)
GLUCOSE: 198 mg/dL — AB (ref 65–99)
Globulin: 2.2 g/dL (calc) (ref 1.9–3.7)
Potassium: 5.8 mmol/L — ABNORMAL HIGH (ref 3.5–5.3)
Sodium: 138 mmol/L (ref 135–146)
Total Bilirubin: 0.5 mg/dL (ref 0.2–1.2)
Total Protein: 6.2 g/dL (ref 6.1–8.1)

## 2017-06-21 LAB — HEMOGLOBIN A1C
EAG (MMOL/L): 12.2 (calc)
HEMOGLOBIN A1C: 9.3 %{Hb} — AB (ref <5.7)
MEAN PLASMA GLUCOSE: 220 (calc)

## 2017-06-21 LAB — MICROALBUMIN, URINE: Microalb, Ur: 1.4 mg/dL

## 2017-06-27 ENCOUNTER — Other Ambulatory Visit: Payer: Self-pay

## 2017-06-27 ENCOUNTER — Other Ambulatory Visit: Payer: Medicare Other

## 2017-06-27 DIAGNOSIS — E875 Hyperkalemia: Secondary | ICD-10-CM | POA: Diagnosis not present

## 2017-06-28 LAB — BASIC METABOLIC PANEL
BUN / CREAT RATIO: 12 (calc) (ref 6–22)
BUN: 16 mg/dL (ref 7–25)
CHLORIDE: 105 mmol/L (ref 98–110)
CO2: 27 mmol/L (ref 20–32)
CREATININE: 1.29 mg/dL — AB (ref 0.70–1.25)
Calcium: 9.4 mg/dL (ref 8.6–10.3)
GLUCOSE: 210 mg/dL — AB (ref 65–99)
Potassium: 5.6 mmol/L — ABNORMAL HIGH (ref 3.5–5.3)
Sodium: 141 mmol/L (ref 135–146)

## 2017-06-29 ENCOUNTER — Telehealth: Payer: Self-pay

## 2017-06-29 ENCOUNTER — Telehealth: Payer: Self-pay | Admitting: Cardiovascular Disease

## 2017-06-29 NOTE — Telephone Encounter (Signed)
-----   Message from Orlena Sheldon, PA-C sent at 06/28/2017  7:23 AM EST ----- Potassium level is elevated even on this repeat lab. Medication benazepril/Lotensin can cause this.  Therefore need to stop this medication.  Remove from med list.  Add to "allergy list"---ACE Inhibitors--Hyperkalemia To keep his blood pressure controlled after stopping that medication, need to increase the Bystolic from 5 mg to 10 mg.   Send prescription for Bystolic 10 mg 1 p.o. daily #30+3. He usually sees [name].  He just saw me for this recent office visit because of some acute issues.   Have him schedule follow-up visit with [name] in 1-2 weeks.

## 2017-06-29 NOTE — Telephone Encounter (Signed)
John [name] is calling because his PCP is wanting him to change his medications that Dr. Claiborne Billings has him on and he is not going to do this until he speaks with Dr. Claiborne Billings  Or his Nurse . Please call

## 2017-06-29 NOTE — Telephone Encounter (Signed)
Call was placed to patient to discuss lab results. Patient states he does not feel comfortable increasing his bystolic because his cardiology changed the rx from 10 mg to 5mg  and added benazepril.Patient states he will follow up with his cardiology to discuss recommendations and if the cardiology is fine with the change then Patient states he will call back and have the new rx for bystolic 10mg  sent to pharmacy.

## 2017-06-29 NOTE — Telephone Encounter (Signed)
Agree to discontinuing as a pill with his elevation of potassium.  However, at his last office visit.  He had bradycardia in the low 50s.  For this reason, I would continue Bystolic at the present dose and not increased.  Presently.  The patient will need to monitor blood pressure.  Additional medication adjustment may be necessary.

## 2017-06-29 NOTE — Telephone Encounter (Signed)
Spoke with patient and he stated, he had labs and his potassium was elevated and his pcp  Suggested he make the following changes to his meds : increase Bystolic to 10 mg qd and discontiue the Benazepril. Is this fine?

## 2017-06-29 NOTE — Telephone Encounter (Signed)
Returned call to patient Dr.Kelly's recommendations given.Advised to monitor B/P and call back if B/P elevated.

## 2017-07-02 DIAGNOSIS — D333 Benign neoplasm of cranial nerves: Secondary | ICD-10-CM | POA: Diagnosis not present

## 2017-07-02 DIAGNOSIS — F17210 Nicotine dependence, cigarettes, uncomplicated: Secondary | ICD-10-CM | POA: Diagnosis not present

## 2017-07-02 DIAGNOSIS — H905 Unspecified sensorineural hearing loss: Secondary | ICD-10-CM | POA: Diagnosis not present

## 2017-07-02 DIAGNOSIS — H9311 Tinnitus, right ear: Secondary | ICD-10-CM | POA: Diagnosis not present

## 2017-07-02 DIAGNOSIS — H9041 Sensorineural hearing loss, unilateral, right ear, with unrestricted hearing on the contralateral side: Secondary | ICD-10-CM | POA: Diagnosis not present

## 2017-07-03 ENCOUNTER — Telehealth: Payer: Self-pay

## 2017-07-03 ENCOUNTER — Other Ambulatory Visit: Payer: Self-pay | Admitting: Cardiovascular Disease

## 2017-07-03 NOTE — Telephone Encounter (Signed)
Call placed to patient to discuss his bystolic as well as his benazepril. Patient states Dr Claiborne Billings does not want to increase his bystolic from 5mg  to 10 mg, but is agreeable for him to stop the benazeapril(lotensin) with patient monitoring his blood pressure.  As requested by [name]-PA I will remove benazepril(lotensin) from med list and add to allergy list. Patient is aware and will follow up with his Pcp on 07/09/2017.

## 2017-07-09 ENCOUNTER — Ambulatory Visit (INDEPENDENT_AMBULATORY_CARE_PROVIDER_SITE_OTHER): Payer: Medicare Other | Admitting: Family Medicine

## 2017-07-09 ENCOUNTER — Encounter: Payer: Self-pay | Admitting: Family Medicine

## 2017-07-09 VITALS — BP 134/68 | HR 64 | Temp 98.5°F | Resp 16 | Ht 72.0 in | Wt 225.0 lb

## 2017-07-09 DIAGNOSIS — E119 Type 2 diabetes mellitus without complications: Secondary | ICD-10-CM | POA: Diagnosis not present

## 2017-07-09 DIAGNOSIS — IMO0001 Reserved for inherently not codable concepts without codable children: Secondary | ICD-10-CM

## 2017-07-09 DIAGNOSIS — Z794 Long term (current) use of insulin: Secondary | ICD-10-CM

## 2017-07-09 DIAGNOSIS — I10 Essential (primary) hypertension: Secondary | ICD-10-CM | POA: Diagnosis not present

## 2017-07-09 LAB — EXTRA LAV TOP TUBE

## 2017-07-09 LAB — BASIC METABOLIC PANEL
BUN: 11 mg/dL (ref 7–25)
CALCIUM: 8.7 mg/dL (ref 8.6–10.3)
CO2: 28 mmol/L (ref 20–32)
Chloride: 109 mmol/L (ref 98–110)
Creat: 1.07 mg/dL (ref 0.70–1.25)
GLUCOSE: 142 mg/dL — AB (ref 65–99)
Potassium: 4.5 mmol/L (ref 3.5–5.3)
SODIUM: 142 mmol/L (ref 135–146)

## 2017-07-09 NOTE — Progress Notes (Signed)
Subjective:    Patient ID: John [name], male    DOB: March 22, 1951, 67 y.o.   MRN: [id number]  HPI Patient recently saw my partner for elevated blood sugar. Hemoglobin A1c was found to be elevated at 9.3. Potassium was also found to be elevated on 2 separate occasions and therefore she discontinued his ACE inhibitor. He is here today to recheck his potassium level. He has been off his ACE inhibitor for about 12 days. His blood pressure is well controlled. He denies any chest pain shortness of breath or dyspnea on exertion. His fasting blood sugars typically between 120 and 140. His two-hour postprandial sugars are between 180 and 200 after he has adjusted his insulin. He is currently on 34 units a day of Levemir. Past Medical History:  Diagnosis Date  . Coronary artery disease   . Diabetes mellitus   . GERD (gastroesophageal reflux disease)   . History of nuclear stress test 04/04/2011   lexiscan; mod-large in size fixed inferolateral defect (scar); non-diagnostic for ischemia; low risk scan   . Hyperlipidemia   . Hypertension   . Left foot drop    r/t past disk srugery - uses Kevlar brace  . Myocardial infarction ([place])    posterior MI  . Shortness of breath   . Sleep apnea    on CPAP; 04/28/2007 split-night - AHI during total sleep 44.43/hr and REM 72.56/hr   Past Surgical History:  Procedure Laterality Date  . BACK SURGERY  1985  . CARDIAC CATHETERIZATION  2010   6 stents total  . CARDIAC CATHETERIZATION  01/2000   percutaneous transluminal coronary balloon angioplasty of mid RCA stenotic lesion  . CARDIAC CATHETERIZATION  06/2006   no stenting; ischemic cardiomyopathy, EF 40-45%  . CARDIOVERSION N/A 07/28/2016   Procedure: CARDIOVERSION;  Surgeon: Troy Sine, MD;  Location: [facility];  Service: Cardiovascular;  Laterality: N/A;  . CORONARY ANGIOPLASTY  09/1998   mid-distal RCA balloon dilatation, 4.5 & 5.0 stents   . CORONARY ANGIOPLASTY WITH STENT PLACEMENT  03/1994   angioplasty & stenting (non-DES) of circumflex/prox ramus intermedius  . CORONARY ANGIOPLASTY WITH STENT PLACEMENT  10/1994   large iliac [id number] stent to RCA  . CORONARY ANGIOPLASTY WITH STENT PLACEMENT  12/2002   4.13mm stents x2 of RCA  . CORONARY ANGIOPLASTY WITH STENT PLACEMENT  01/2005   cutting balloon arthrectomy of distal RCA & Cypher DES 3.5x13; cutting balloon arthrectomy of mid RCA with Cypher DES 3.5x18  . CORONARY ANGIOPLASTY WITH STENT PLACEMENT  11/2008   stenting of mid RCA with 4.0x62mm driver, non-DES  . LEFT HEART CATHETERIZATION WITH CORONARY ANGIOGRAM N/A 02/27/2012   Procedure: LEFT HEART CATHETERIZATION WITH CORONARY ANGIOGRAM;  Surgeon: Lorretta Harp, MD;  Location: [facility] CATH LAB;  Service: Cardiovascular;  Laterality: N/A;  . TRANSTHORACIC ECHOCARDIOGRAM  07/29/2010   EF 50=55%, mod inf wall hypokinesis & mild post wall hypokinesis; LA mild-mod dilated; mild mitral annular calcif & mild MR; mild TR & elevated RV systolic pressure; AV mildly sclerotic; mild aortic root dilatation    Current Outpatient Medications on File Prior to Visit  Medication Sig Dispense Refill  . amiodarone (PACERONE) 200 MG tablet Take 1 tablet (200 mg total) by mouth 2 (two) times daily. 60 tablet 11  . aspirin 81 MG tablet Take 81 mg by mouth daily.    [name] BYSTOLIC 5 MG tablet TAKE 1 TABLET BY MOUTH EVERY DAY 30 tablet 9  . ELIQUIS 5 MG TABS tablet TAKE 1 TABLET  BY MOUTH TWICE A DAY 180 tablet 1  . glucose blood (ONE TOUCH ULTRA TEST) test strip USE 1 STRIP 3 TIMES DAILY - USE AS INSTRUCTED DX: E11.9 100 each 2  . Insulin Pen Needle (NOVOFINE) 32G X 6 MM MISC 1 each by Other route daily. 100 each 3  . LEVEMIR FLEXTOUCH 100 UNIT/ML Pen INJECT 26 UNITS INTO THE SKIN AT BEDTIME. ICD E11.65 (Patient taking differently: INJECT 26 UNITS INTO THE SKIN AT BEDTIME. ICD E11.65/PATIENT INJECTING 34 UNITS AT BEDTIME) 15 pen 3  . montelukast (SINGULAIR) 10 MG tablet TAKE 1 TABLET BY MOUTH EVERY DAY 30 tablet 11    . nitroGLYCERIN (NITROSTAT) 0.4 MG SL tablet Place 0.4 mg under the tongue every 5 (five) minutes as needed for chest pain.    . rosuvastatin (CRESTOR) 20 MG tablet Take 1 tablet (20 mg total) by mouth daily. 90 tablet 3   No current facility-administered medications on file prior to visit.    Allergies  Allergen Reactions  . Benazepril Other (See Comments)    hyperkalemia  . Fish Allergy Itching  . Omega-3 Fatty Acids Itching    hives  . Fish Oil Itching and Rash   Social History   Socioeconomic History  . Marital status: Married    Spouse name: Not on file  . Number of children: 3  . Years of education: Not on file  . Highest education level: Not on file  Social Needs  . Financial resource strain: Not on file  . Food insecurity - worry: Not on file  . Food insecurity - inability: Not on file  . Transportation needs - medical: Not on file  . Transportation needs - non-medical: Not on file  Occupational History  . 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 [employer]  Tobacco Use  . Smoking status: Current Some Day Smoker    Packs/day: 1.00    Years: 50.00    Pack years: 50.00    Types: Cigarettes  . Smokeless tobacco: Never Used  Substance and Sexual Activity  . Alcohol use: Yes    Alcohol/week: 0.0 oz    Comment: occasionally  . Drug use: No  . Sexual activity: Yes  Other Topics Concern  . Not on file  Social History Narrative  . Not on file      Review of Systems  All other systems reviewed and are negative.      Objective:   Physical Exam  Cardiovascular: Normal rate, regular rhythm and normal heart sounds.  Pulmonary/Chest: Effort normal and breath sounds normal. No respiratory distress. He has no wheezes. He has no rales.  Abdominal: Soft. Bowel sounds are normal.  Musculoskeletal: He exhibits no edema.  Vitals reviewed.         Assessment & Plan:  Essential hypertension - Plan: Basic Metabolic Panel  IDDM  (insulin dependent diabetes mellitus) (HCC)  Increase Levemir to 40 units. I believe this will likely manage his blood sugars adequately. Recheck blood sugars in one month. He will be due for repeat hemoglobin A1c in 3 months. Blood pressure is excellent despite the fact he has held his ACE inhibitor. Recheck potassium today. If potassium remains elevated, I will likely add low-dose hydrochlorothiazide.

## 2017-07-10 ENCOUNTER — Encounter: Payer: Self-pay | Admitting: Family Medicine

## 2017-07-12 DIAGNOSIS — H90A21 Sensorineural hearing loss, unilateral, right ear, with restricted hearing on the contralateral side: Secondary | ICD-10-CM | POA: Diagnosis not present

## 2017-07-12 DIAGNOSIS — D333 Benign neoplasm of cranial nerves: Secondary | ICD-10-CM | POA: Diagnosis not present

## 2017-07-16 ENCOUNTER — Telehealth: Payer: Self-pay | Admitting: Family Medicine

## 2017-07-16 MED ORDER — INSULIN DETEMIR 100 UNIT/ML FLEXPEN: 34.0000 [IU] | PEN_INJECTOR | Freq: Every day | SUBCUTANEOUS | 3 refills | Status: DC

## 2017-07-16 NOTE — Telephone Encounter (Signed)
Pt called and states that he is using 34 units of insulin now and that seems to be keeping his BS down but he needs a new RX called in for the increased dose. Med sent to pharm.

## 2017-07-21 ENCOUNTER — Other Ambulatory Visit: Payer: Self-pay | Admitting: Family Medicine

## 2017-08-04 ENCOUNTER — Other Ambulatory Visit: Payer: Self-pay | Admitting: Family Medicine

## 2017-08-08 ENCOUNTER — Telehealth: Payer: Self-pay | Admitting: Family Medicine

## 2017-08-08 NOTE — Telephone Encounter (Signed)
Pt called and LMOVM stating her would like an antibx called in as he is having a lot of chest congestion, cough and sinus pressure with drainage. Tried to call pt back to get more information  - [facility]

## 2017-08-09 DIAGNOSIS — H35033 Hypertensive retinopathy, bilateral: Secondary | ICD-10-CM | POA: Diagnosis not present

## 2017-08-09 DIAGNOSIS — H47031 Optic nerve hypoplasia, right eye: Secondary | ICD-10-CM | POA: Diagnosis not present

## 2017-08-09 LAB — HM DIABETES EYE EXAM

## 2017-08-13 NOTE — Telephone Encounter (Signed)
No return call - closing note

## 2017-08-15 ENCOUNTER — Other Ambulatory Visit: Payer: Self-pay

## 2017-08-15 ENCOUNTER — Ambulatory Visit (INDEPENDENT_AMBULATORY_CARE_PROVIDER_SITE_OTHER): Payer: Medicare Other | Admitting: Physician Assistant

## 2017-08-15 ENCOUNTER — Encounter: Payer: Self-pay | Admitting: Physician Assistant

## 2017-08-15 VITALS — BP 150/72 | HR 71 | Temp 98.3°F | Resp 16 | Wt 223.4 lb

## 2017-08-15 DIAGNOSIS — J988 Other specified respiratory disorders: Secondary | ICD-10-CM

## 2017-08-15 DIAGNOSIS — B9689 Other specified bacterial agents as the cause of diseases classified elsewhere: Secondary | ICD-10-CM

## 2017-08-15 MED ORDER — HYDROCODONE-HOMATROPINE 5-1.5 MG/5ML PO SYRP: 5.0000 mL | ORAL_SOLUTION | Freq: Four times a day (QID) | ORAL | 0 refills | Status: DC | PRN

## 2017-08-15 MED ORDER — DOXYCYCLINE HYCLATE 100 MG PO TABS
100.0000 mg | ORAL_TABLET | Freq: Two times a day (BID) | ORAL | 0 refills | Status: DC
Start: 2017-08-15 — End: 2017-09-11

## 2017-08-15 NOTE — Progress Notes (Signed)
Patient ID: MATTHER LABELL MRN: [id number], DOB: 05-12-1951, 67 y.o. Date of Encounter: 08/15/2017, 11:19 AM    Chief Complaint:  Chief Complaint  Patient presents with  . chest congestion    x2weeks  . Chills    x2weeks  . Diarrhea  . Night Sweats     HPI: 67 y.o. year old male presents with above.   His wife accompanies her for visit.  They report that she has also been sick with similar symptoms recently. They report that he has been having cough and chest congestion for about 2 weeks.  Says that he has intermittently had symptoms of fevers and chills and his felt sleepy and weak.  Asked about the diarrhea.  Says that he had that one day last week and then had a episode last night and this morning.  Those were the only times he has had diarrhea.  Has had no vomiting or abdominal pain.  Been using Mucinex and Tylenol.     Home Meds:   Outpatient Medications Prior to Visit  Medication Sig Dispense Refill  . amiodarone (PACERONE) 200 MG tablet Take 1 tablet (200 mg total) by mouth 2 (two) times daily. 60 tablet 11  . aspirin 81 MG tablet Take 81 mg by mouth daily.    [name] BYSTOLIC 5 MG tablet TAKE 1 TABLET BY MOUTH EVERY DAY 30 tablet 9  . ELIQUIS 5 MG TABS tablet TAKE 1 TABLET BY MOUTH TWICE A DAY 180 tablet 1  . glucose blood (ONE TOUCH ULTRA TEST) test strip USE 1 STRIP 3 TIMES DAILY - USE AS INSTRUCTED DX: E11.9 100 each 2  . Insulin Detemir (LEVEMIR FLEXTOUCH) 100 UNIT/ML Pen Inject 34 Units into the skin daily. ICD E11.65 15 pen 3  . Insulin Pen Needle (NOVOFINE) 32G X 6 MM MISC 1 each by Other route daily. 100 each 3  . montelukast (SINGULAIR) 10 MG tablet TAKE 1 TABLET BY MOUTH EVERY DAY 90 tablet 3  . nitroGLYCERIN (NITROSTAT) 0.4 MG SL tablet Place 0.4 mg under the tongue every 5 (five) minutes as needed for chest pain.    . rosuvastatin (CRESTOR) 20 MG tablet Take 1 tablet (20 mg total) by mouth daily. 90 tablet 3   No facility-administered medications prior to  visit.     Allergies:  Allergies  Allergen Reactions  . Benazepril Other (See Comments)    hyperkalemia  . Fish Allergy Itching  . Omega-3 Fatty Acids Itching    hives  . Fish Oil Itching and Rash      Review of Systems: See HPI for pertinent ROS. All other ROS negative.    Physical Exam: Blood pressure (!) 150/72, pulse 71, temperature 98.3 F (36.8 C), temperature source Oral, resp. rate 16, weight 101.3 kg (223 lb 6.4 oz), SpO2 95 %., Body mass index is 30.3 kg/m. General:  WNWD WM. Appears in no acute distress. HEENT: Normocephalic, atraumatic, eyes without discharge, sclera non-icteric, nares are without discharge. Bilateral auditory canals clear, TM's are without perforation, pearly grey and translucent with reflective cone of light bilaterally. Oral cavity moist, posterior pharynx without exudate, erythema, peritonsillar abscess.  No tenderness with percussion to frontal or maxillary sinuses bilaterally.  Neck: Supple. No thyromegaly. No lymphadenopathy. Lungs: Clear bilaterally to auscultation without wheezes, rales, or rhonchi. Breathing is unlabored. Heart: Regular rhythm. No murmurs, rubs, or gallops. Abdomen: Soft, non-tender, non-distended with normoactive bowel sounds. No hepatomegaly. No rebound/guarding. No obvious abdominal masses. Msk:  Strength and tone normal  for age. Extremities/Skin: Warm and dry.  Neuro: Alert and oriented X 3. Moves all extremities spontaneously. Gait is normal. CNII-XII grossly in tact. Psych:  Responds to questions appropriately with a normal affect.     ASSESSMENT AND PLAN:  67 y.o. year old male with  1. Bacterial respiratory infection Discussed the need to get this diarrhea controlled/resolved prior to starting antibiotics.  Is that otherwise antibiotics will probably worsen the diarrhea and also discussed that he will not absorb the antibiotics if he is having diarrhea.  He is to stay with a clear liquid diet and then advance to  plain crackers, bland diet.  Once diarrhea is controlled then he is to take the antibiotic as directed.  Can use Hycodan as cough suppressant.  I was going to prescribe azithromycin but noted he is on amiodarone so we will avoid that.  Follow-up if symptoms worsen or do not improve and resolve upon completion of antibiotic. - doxycycline (VIBRA-TABS) 100 MG tablet; Take 1 tablet (100 mg total) by mouth 2 (two) times daily.  Dispense: 20 tablet; Refill: 0 - HYDROcodone-homatropine (HYCODAN) 5-1.5 MG/5ML syrup; Take 5 mLs by mouth every 6 (six) hours as needed.  Dispense: 120 mL; Refill: 0   Signed, [address] [place], [place], [facility] 08/15/2017 11:19 AM

## 2017-08-24 ENCOUNTER — Encounter: Payer: Self-pay | Admitting: *Deleted

## 2017-09-06 DIAGNOSIS — L814 Other melanin hyperpigmentation: Secondary | ICD-10-CM | POA: Diagnosis not present

## 2017-09-06 DIAGNOSIS — D225 Melanocytic nevi of trunk: Secondary | ICD-10-CM | POA: Diagnosis not present

## 2017-09-06 DIAGNOSIS — L570 Actinic keratosis: Secondary | ICD-10-CM | POA: Diagnosis not present

## 2017-09-06 DIAGNOSIS — Z85820 Personal history of malignant melanoma of skin: Secondary | ICD-10-CM | POA: Diagnosis not present

## 2017-09-06 DIAGNOSIS — D1801 Hemangioma of skin and subcutaneous tissue: Secondary | ICD-10-CM | POA: Diagnosis not present

## 2017-09-06 DIAGNOSIS — L821 Other seborrheic keratosis: Secondary | ICD-10-CM | POA: Diagnosis not present

## 2017-09-11 ENCOUNTER — Other Ambulatory Visit: Payer: Self-pay

## 2017-09-11 ENCOUNTER — Emergency Department (HOSPITAL_COMMUNITY): Payer: Medicare Other

## 2017-09-11 ENCOUNTER — Emergency Department (HOSPITAL_COMMUNITY)
Admission: EM | Admit: 2017-09-11 | Discharge: 2017-09-12 | Disposition: A | Discharge status: Home / Self Care | Payer: Medicare Other | Attending: Emergency Medicine | Admitting: Emergency Medicine

## 2017-09-11 ENCOUNTER — Encounter (HOSPITAL_COMMUNITY): Payer: Self-pay | Admitting: Emergency Medicine

## 2017-09-11 ENCOUNTER — Ambulatory Visit (INDEPENDENT_AMBULATORY_CARE_PROVIDER_SITE_OTHER): Payer: Medicare Other | Admitting: Family Medicine

## 2017-09-11 ENCOUNTER — Encounter: Payer: Self-pay | Admitting: Family Medicine

## 2017-09-11 VITALS — BP 150/90 | HR 52 | Temp 97.6°F | Resp 16 | Ht 72.0 in | Wt 231.0 lb

## 2017-09-11 DIAGNOSIS — I110 Hypertensive heart disease with heart failure: Secondary | ICD-10-CM | POA: Diagnosis not present

## 2017-09-11 DIAGNOSIS — J449 Chronic obstructive pulmonary disease, unspecified: Secondary | ICD-10-CM | POA: Insufficient documentation

## 2017-09-11 DIAGNOSIS — I2510 Atherosclerotic heart disease of native coronary artery without angina pectoris: Secondary | ICD-10-CM | POA: Diagnosis not present

## 2017-09-11 DIAGNOSIS — E119 Type 2 diabetes mellitus without complications: Secondary | ICD-10-CM | POA: Diagnosis not present

## 2017-09-11 DIAGNOSIS — Z955 Presence of coronary angioplasty implant and graft: Secondary | ICD-10-CM | POA: Diagnosis not present

## 2017-09-11 DIAGNOSIS — Z87891 Personal history of nicotine dependence: Secondary | ICD-10-CM | POA: Diagnosis not present

## 2017-09-11 DIAGNOSIS — R5383 Other fatigue: Secondary | ICD-10-CM | POA: Diagnosis not present

## 2017-09-11 DIAGNOSIS — R0602 Shortness of breath: Secondary | ICD-10-CM | POA: Diagnosis not present

## 2017-09-11 DIAGNOSIS — I10 Essential (primary) hypertension: Secondary | ICD-10-CM

## 2017-09-11 DIAGNOSIS — I5023 Acute on chronic systolic (congestive) heart failure: Secondary | ICD-10-CM | POA: Insufficient documentation

## 2017-09-11 DIAGNOSIS — R079 Chest pain, unspecified: Secondary | ICD-10-CM | POA: Diagnosis not present

## 2017-09-11 DIAGNOSIS — R531 Weakness: Secondary | ICD-10-CM | POA: Diagnosis not present

## 2017-09-11 DIAGNOSIS — Z79899 Other long term (current) drug therapy: Secondary | ICD-10-CM | POA: Diagnosis not present

## 2017-09-11 DIAGNOSIS — H903 Sensorineural hearing loss, bilateral: Secondary | ICD-10-CM | POA: Insufficient documentation

## 2017-09-11 DIAGNOSIS — L0390 Cellulitis, unspecified: Secondary | ICD-10-CM | POA: Diagnosis not present

## 2017-09-11 DIAGNOSIS — Z794 Long term (current) use of insulin: Secondary | ICD-10-CM | POA: Diagnosis not present

## 2017-09-11 DIAGNOSIS — I252 Old myocardial infarction: Secondary | ICD-10-CM | POA: Diagnosis not present

## 2017-09-11 DIAGNOSIS — D333 Benign neoplasm of cranial nerves: Secondary | ICD-10-CM | POA: Insufficient documentation

## 2017-09-11 DIAGNOSIS — H905 Unspecified sensorineural hearing loss: Secondary | ICD-10-CM | POA: Insufficient documentation

## 2017-09-11 LAB — BASIC METABOLIC PANEL
Anion gap: 10 (ref 5–15)
BUN: 14 mg/dL (ref 6–20)
CALCIUM: 8.9 mg/dL (ref 8.9–10.3)
CO2: 24 mmol/L (ref 22–32)
CREATININE: 1.56 mg/dL — AB (ref 0.61–1.24)
Chloride: 106 mmol/L (ref 101–111)
GFR calc Af Amer: 51 mL/min — ABNORMAL LOW (ref >60)
GFR, EST NON AFRICAN AMERICAN: 44 mL/min — AB (ref >60)
Glucose, Bld: 171 mg/dL — ABNORMAL HIGH (ref 65–99)
POTASSIUM: 3.7 mmol/L (ref 3.5–5.1)
SODIUM: 140 mmol/L (ref 135–145)

## 2017-09-11 LAB — CBC
HEMATOCRIT: 45 % (ref 39.0–52.0)
Hemoglobin: 14.1 g/dL (ref 13.0–17.0)
MCH: 29 pg (ref 26.0–34.0)
MCHC: 31.3 g/dL (ref 30.0–36.0)
MCV: 92.4 fL (ref 78.0–100.0)
PLATELETS: 166 10*3/uL (ref 150–400)
RBC: 4.87 MIL/uL (ref 4.22–5.81)
RDW: 15.2 % (ref 11.5–15.5)
WBC: 8.2 10*3/uL (ref 4.0–10.5)

## 2017-09-11 LAB — I-STAT TROPONIN, ED: TROPONIN I, POC: 0.02 ng/mL (ref 0.00–0.08)

## 2017-09-11 LAB — TROPONIN I: Troponin I: <0.03 ng/mL (ref <0.03)

## 2017-09-11 MED ORDER — MUPIROCIN 2 % EX OINT
1.0000 | TOPICAL_OINTMENT | Freq: Two times a day (BID) | CUTANEOUS | 0 refills | Status: DC
Start: 2017-09-11 — End: 2017-09-21

## 2017-09-11 MED ORDER — MUPIROCIN 2 % EX OINT: TOPICAL_OINTMENT | Freq: Once | CUTANEOUS | Status: DC

## 2017-09-11 NOTE — Patient Instructions (Signed)
Mupirocin nasal ointment What is this medicine? MUPIROCIN CALCIUM (myoo PEER oh sin KAL see um) is an antibiotic. It is used inside the nose to treat infections that are caused by certain bacteria. This helps prevent the spread of infection to patients and health care workers during outbreaks at institutions. This medicine may be used for other purposes; ask your health care provider or pharmacist if you have questions. COMMON BRAND NAME(S): Bactroban What should I tell my health care provider before I take this medicine? They need to know if you have any of these conditions: -an unusual or allergic reaction to mupirocin, other medicines, foods, dyes, or preservatives -pregnant or trying to get pregnant -breast-feeding How should I use this medicine? This medicine is only for use inside the nose. Follow the directions on the prescription label. Wash your hands before and after use. Squeeze half the contents of a single-use tube into one nostril, then squeeze the other half into the other nostril. Press the sides of your nose together and gently massage after application to spread the ointment throughout the nostrils. Do not use your medicine more often than directed. Finish the full course of medicine prescribed by your doctor or health care professional even if you think your condition is better. Talk to your pediatrician regarding the use of this medicine in children. Special care may be needed. Overdosage: If you think you have taken too much of this medicine contact a poison control center or emergency room at once. NOTE: This medicine is only for you. Do not share this medicine with others. What if I miss a dose? If you miss a dose, take it as soon as you can. If it is almost time for your next dose, take only that dose. Do not take double or extra doses. What may interact with this medicine? Interactions are not expected. Do not use any other nose products without telling your doctor or  health care professional. This list may not describe all possible interactions. Give your health care provider a list of all the medicines, herbs, non-prescription drugs, or dietary supplements you use. Also tell them if you smoke, drink alcohol, or use illegal drugs. Some items may interact with your medicine. What should I watch for while using this medicine? If your nose is severely irritated, burning or stinging from use of this medicine, stop using it and contact your doctor or health care professional. Do not get this medicine in your eyes. If you do, rinse out with plenty of cool tap water. What side effects may I notice from receiving this medicine? Side effects that you should report to your doctor or health care professional as soon as possible: -severe irritation, burning, stinging, or pain Side effects that usually do not require medical attention (report to your doctor or health care professional if they continue or are bothersome): -altered taste -cough -headache -skin itching -sore throat -stuffy or runny nose This list may not describe all possible side effects. Call your doctor for medical advice about side effects. You may report side effects to FDA at 1-800-FDA-1088. Where should I keep my medicine? Keep out of the reach of children. Store at room temperature between 15 and 30 degrees C (59 and 86 degrees F). Do not refrigerate. One tube of ointment is for single use in both nostrils. Throw away after use. NOTE: This sheet is a summary. It may not cover all possible information. If you have questions about this medicine, talk to your doctor, pharmacist, or  health care provider.  2018 Elsevier/Gold Standard (2007-12-23 14:36:10)    Fatigue Fatigue is feeling tired all of the time, a lack of energy, or a lack of motivation. Occasional or mild fatigue is often a normal response to activity or life in general. However, long-lasting (chronic) or extreme fatigue may indicate an  underlying medical condition. Follow these instructions at home: Watch your fatigue for any changes. The following actions may help to lessen any discomfort you are feeling:  Talk to your health care provider about how much sleep you need each night. Try to get the required amount every night.  Take medicines only as directed by your health care provider.  Eat a healthy and nutritious diet. Ask your health care provider if you need help changing your diet.  Drink enough fluid to keep your urine clear or pale yellow.  Practice ways of relaxing, such as yoga, meditation, massage therapy, or acupuncture.  Exercise regularly.  Change situations that cause you stress. Try to keep your work and personal routine reasonable.  Do not abuse illegal drugs.  Limit alcohol intake to no more than 1 drink per day for nonpregnant women and 2 drinks per day for men. One drink equals 12 ounces of beer, 5 ounces of wine, or 1 ounces of hard liquor.  Take a multivitamin, if directed by your health care provider.  Contact a health care provider if:  Your fatigue does not get better.  You have a fever.  You have unintentional weight loss or gain.  You have headaches.  You have difficulty: ? Falling asleep. ? Sleeping throughout the night.  You feel angry, guilty, anxious, or sad.  You are unable to have a bowel movement (constipation).  You skin is dry.  Your legs or another part of your body is swollen. Get help right away if:  You feel confused.  Your vision is blurry.  You feel faint or pass out.  You have a severe headache.  You have severe abdominal, pelvic, or back pain.  You have chest pain, shortness of breath, or an irregular or fast heartbeat.  You are unable to urinate or you urinate less than normal.  You develop abnormal bleeding, such as bleeding from the rectum, vagina, nose, lungs, or nipples.  You vomit blood.  You have thoughts about harming yourself or  committing suicide.  You are worried that you might harm someone else. This information is not intended to replace advice given to you by your health care provider. Make sure you discuss any questions you have with your health care provider. Document Released: 04/02/2007 Document Revised: 11/11/2015 Document Reviewed: 10/07/2013 Elsevier Interactive Patient Education  [employer].

## 2017-09-11 NOTE — Progress Notes (Signed)
Patient ID: John [name], male    DOB: 1951/06/13, 67 y.o.   MRN: [id number]  PCP: [name], MD  Chief Complaint  Patient presents with  . left nostril  sore    started after finishing antibiotic   . Fatigue    Subjective:   EUSEVIO [name] is a 67 y/o male, PMHx of presents with CC of swelling and pain to his left nostril and also complains of fatigue and frequent falling asleep.    His nose has been swollen, red and painful for at least 10 days.  It began shortly after an illness that he was given abx for.  He denies any nasal congestion, frequent nose blowing, or any fb in his nose, but it feels sore and stinging, then became swollen and red, with mild, constant pain, exacerbated by any palpation.  It is gradually improving without any treatment, but still is bothersome to him.  No associated facial pain, swelling, redness, nasal discharge, nasal congestion, HA, fever, rash.  He notes hx of staph infection.  Pt also complains of feeling "crappy" and says he's sick of feeling bad.  He also describes it as "extremely tired all the time" like he could "close his eyes right now and fall asleep."  Yesterday he worked outside for a little bit and then almost fell asleep resting in a chair outside.  He has felt like this "for years."  No change to this with recent medication changes.  No worse after recent illness.  He currently denies CP, LE edema, orthopnea, PND, dyspnea on exertion, palpitations, near syncope, wheeze, cough, congestion, change in urine output.  Pt recently stopped smoking, Feb. 28, 2019 and thought he would feel better but he did not.  He denies any cough, wheeze or exertional SOB.  He is not using inhalers or doing breathing tx. He wears CPAP at night, not sleeping well regardless of position, flat in bed, tossing side to side, or getting up to the recliner.  Pt denies every taking diuretics before.  Fatigue sx not similar to prior cardiac events He states his  sugars have been well controlled.  Last time he felt very fatigued his checked his sugar and it was reportedly 122.  He reports morning blood sugars range 90-110.  When evening sugars are elevated above 200 he uses higher levemir dose of 34.  When they are lower he states he has self adjusted the evening dose to 24.  He has not noticed any change in energy related to higher or lower sugars.   Recent medication changes per chart review: Benazepril d/c Jan 2019 due to hyperkalemia  Reviewed PCP and cardiology visits 07/03/17 - current Recent labs and vitals reviewed Last ECHO 04/26/17:   LVEF- 40-45%, mild LVH, old inferior scar, valve stable Last EKG reviewed -   Review of Systems  Constitutional: Positive for fatigue. Negative for activity change, appetite change, chills, diaphoresis, fever and unexpected weight change.  HENT: Negative for congestion, facial swelling, mouth sores, nosebleeds, postnasal drip, rhinorrhea, sinus pressure, sinus pain, sneezing, sore throat and tinnitus.   Eyes: Negative.   Respiratory: Negative for cough, choking, chest tightness, shortness of breath, wheezing and stridor.   Cardiovascular: Negative for chest pain, palpitations and leg swelling.  Gastrointestinal: Negative.  Negative for abdominal pain, diarrhea, nausea and vomiting.  Endocrine: Negative for polydipsia, polyphagia and polyuria.  Genitourinary: Negative for decreased urine volume, difficulty urinating, dysuria and frequency.  Musculoskeletal: Negative.   Skin: Negative  for color change and pallor.  Allergic/Immunologic: Negative.   Neurological: Negative for dizziness, tremors, syncope, weakness, light-headedness, numbness and headaches.  Psychiatric/Behavioral: Negative.      Prior to Admission medications   Medication Sig Start Date End Date Taking? Authorizing Provider  amiodarone (PACERONE) 200 MG tablet Take 1 tablet (200 mg total) by mouth 2 (two) times daily. 07/20/16  Yes Troy Sine, MD  aspirin 81 MG tablet Take 81 mg by mouth daily.   Yes [provider]  BYSTOLIC 5 MG tablet TAKE 1 TABLET BY MOUTH EVERY DAY 06/04/17  Yes Troy Sine, MD  ELIQUIS 5 MG TABS tablet TAKE 1 TABLET BY MOUTH TWICE A DAY 05/29/17  Yes Troy Sine, MD  glucose blood (ONE TOUCH ULTRA TEST) test strip USE 1 STRIP 3 TIMES DAILY - USE AS INSTRUCTED DX: E11.9 07/23/17  Yes [name], MD  Insulin Detemir (LEVEMIR FLEXTOUCH) 100 UNIT/ML Pen Inject 34 Units into the skin daily. ICD E11.65 07/16/17  Yes [name], MD  Insulin Pen Needle (NOVOFINE) 32G X 6 MM MISC 1 each by Other route daily. 02/20/17  Yes [name], MD  montelukast (SINGULAIR) 10 MG tablet TAKE 1 TABLET BY MOUTH EVERY DAY 08/06/17  Yes [name], MD  nitroGLYCERIN (NITROSTAT) 0.4 MG SL tablet Place 0.4 mg under the tongue every 5 (five) minutes as needed for chest pain.   Yes [provider]  mupirocin ointment (BACTROBAN) 2 % Place 1 application into the nose 2 (two) times daily. 09/11/17   Delsa Grana, PA-C  rosuvastatin (CRESTOR) 20 MG tablet Take 1 tablet (20 mg total) by mouth daily. 07/20/16 04/19/17  Troy Sine, MD     Allergies  Allergen Reactions  . Benazepril Other (See Comments)    hyperkalemia  . Fish Allergy Itching  . Omega-3 Fatty Acids Hives and Itching  . Fish Oil Itching and Rash     Patient Active Problem List   Diagnosis Date Noted  . Acoustic neuroma ([place]) 09/11/2017  . Asymmetrical sensorineural hearing loss 09/11/2017  . Encounter for cardioversion procedure   . Anticoagulation adequate 06/06/2016  . Fatigue 10/27/2015  . Bradycardia 10/27/2015  . Hyperlipidemia LDL goal <70 05/20/2015  . OSA on CPAP 07/23/2013  . Excessive daytime sleepiness 07/23/2013  . Hypertension   . Hyperlipidemia   . Diabetes mellitus   . Chest pain 08/18/2012  . Tobacco abuse 08/18/2012  . Unstable angina, cath [facility] 02/27/12 02/26/2012  . CAD, multiple prior RCA PCI's.  Last cath 2010, Myoview low risk Oct 2012 02/26/2012  . IDDM (insulin dependent diabetes mellitus) ([place]) 02/26/2012  . HTN (hypertension) 02/26/2012  . Dyslipidemia, low HDL. Intol to fish oil and Noacin 02/26/2012  . Sleep apnea, on C-pap 02/26/2012  . COPD (chronic obstructive pulmonary disease) ([place]) 02/26/2012  . Smoking, quit one week ago 02/26/2012  . GERD (gastroesophageal reflux disease) 02/26/2012     Family History  Problem Relation Age of Onset  . Heart attack Father      Social History   Socioeconomic History  . Marital status: Married    Spouse name: Not on file  . Number of children: 3  . Years of education: Not on file  . Highest education level: Not on file  Occupational History  . 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 [place]  Social Needs  . Financial resource strain: Not on file  .  Food insecurity:    Worry: Not on file    Inability: Not on file  . Transportation needs:    Medical: Not on file    Non-medical: Not on file  Tobacco Use  . Smoking status: Former Smoker    Packs/day: 1.00    Years: 50.00    Pack years: 50.00    Types: Cigarettes    Last attempt to quit: 08/01/2017    Years since quitting: 0.1  . Smokeless tobacco: Never Used  Substance and Sexual Activity  . Alcohol use: Yes    Alcohol/week: 0.0 oz    Comment: occasionally  . Drug use: No  . Sexual activity: Yes  Lifestyle  . Physical activity:    Days per week: Not on file    Minutes per session: Not on file  . Stress: Not on file  Relationships  . Social connections:    Talks on phone: Not on file    Gets together: Not on file    Attends religious service: Not on file    Active member of club or organization: Not on file    Attends meetings of clubs or organizations: Not on file    Relationship status: Not on file  . Intimate partner violence:    Fear of current or ex partner: Not on file    Emotionally abused: Not on file     Physically abused: Not on file    Forced sexual activity: Not on file  Other Topics Concern  . Not on file  Social History Narrative  . Not on file         Objective:   Physical Exam  Constitutional: He is oriented to person, place, and time. He appears well-developed and well-nourished.  Non-toxic appearance. He does not appear ill. No distress.  HENT:  Head: Normocephalic and atraumatic.  Right Ear: Tympanic membrane, external ear and ear canal normal.  Left Ear: Tympanic membrane, external ear and ear canal normal.  Nose: Mucosal edema (with erythema to left medial nasal mucosa) and sinus tenderness present. No rhinorrhea, nose lacerations, nasal deformity or nasal septal hematoma. No epistaxis.  No foreign bodies. Right sinus exhibits no maxillary sinus tenderness and no frontal sinus tenderness. Left sinus exhibits no maxillary sinus tenderness and no frontal sinus tenderness.    Mouth/Throat: Uvula is midline. No trismus in the jaw. No uvula swelling. No oropharyngeal exudate, posterior oropharyngeal edema or posterior oropharyngeal erythema.  Eyes: Pupils are equal, round, and reactive to light. Conjunctivae, EOM and lids are normal. Right conjunctiva is not injected. Left conjunctiva is not injected.  Neck: Trachea normal, normal range of motion and phonation normal. Neck supple. No tracheal deviation present.  Cardiovascular: Regular rhythm, normal heart sounds and normal pulses.  No extrasystoles are present. Bradycardia present. Exam reveals no gallop and no friction rub.  No murmur heard. Pulses:      Radial pulses are 2+ on the right side, and 2+ on the left side.  Pulmonary/Chest: Effort normal and breath sounds normal. No accessory muscle usage or stridor. No tachypnea. No respiratory distress. He has no decreased breath sounds. He has no wheezes. He has no rhonchi. He has no rales.  Abdominal: Soft. Normal appearance and bowel sounds are normal. He exhibits no  distension. There is no tenderness. There is no rebound and no guarding.  Musculoskeletal: Normal range of motion. He exhibits no edema.  Neurological: He is alert and oriented to person, place, and time. Gait normal.  Skin: Skin  is warm, dry and intact. Capillary refill takes less than 2 seconds. No rash noted. He is not diaphoretic.  Mild diffuse pallor  Psychiatric: He has a normal mood and affect. His speech is normal and behavior is normal.  Nursing note and vitals reviewed.           Assessment & Plan:    1. Fatigue, unspecified type  Pt reports fatigue for "years," largely unchanged.  Not sleeping well.  This is documented several past visits - excessive daytime sleepiness in problem list since 07/23/2013 .    Pt recently had medication changes secondary to hyperkalemia, had recent illness and stopped smoking.  Pt is currently bradycardic with BP gradually increasing at visits since ACEI d/c'd.    DDx med side effects of BB, possibly electrolyte abnormality, or fatigue secondary to interrupted sleep.  Pt also appears mildly pale to me, however he is new pt to me, he does not have any other sx concerning for anemia.    Plan:  Recheck labs:   - Basic metabolic panel   - CBC with Differential   - TSH   - Testosterone   Pt offered sleep aid Rx trial for one week but pt refused.    2. Cellulitis, unspecified cellulitis site Left nare, left nasal septum erythematous.  Pt notes it has improved since onset but not resolving.   Topical Abx tx, do not feel oral Abx indicated at this time, no surrounding induration. - mupirocin ointment (BACTROBAN) 2 %; Place 1 application into the nose 2 (two) times daily.  Dispense: 22 g; Refill: 0 F/up as need or if it worsens.  3.  HTN - chronic, today mildly elevated  Initially at todays visit SBP was 160, and before leaving clinic was repeated and was 150, which was same as last visit in clinic.  Reviewed recent notes from PCP and  cardiologist and did not see plans to add another BP med, though noted not to increase Bystolic due to bradycardia.  Decided with pt to monitor BP, check in 1-2 weeks, and reviewed ER precautions.   Pt seen in a shard visit with [name], please see his documentation. Pt left clinic in good condition.  Plan and follow up printed for pt.  09/12/17 2:41 PM  Labs received and reviewed.  Glucose elevated, H/H decreased from labs 4 months ago, but still high end of normal values, will monitor.  Hx of mild polycythemia noted per cardiology.   [name] advised me that pt went to the ER last for CP and elevated BP.  Dx with  Acute on chronic systolic HF, tx with lasix, d/c home to f/up with cardiology and PCP.    Delsa Grana, PA-C 09/12/2017  3:03 PM

## 2017-09-11 NOTE — ED Provider Notes (Signed)
[facility] EMERGENCY DEPARTMENT Provider Note   CSN: [id number] Arrival date & time: 09/11/17  2205     History   Chief Complaint Chief Complaint  Patient presents with  . Chest Pain    HPI John [name] is a 67 y.o. male.  HPI  This is a 67 year old male with a extensive history of coronary artery disease, diabetes, hypertension who presents with shortness of breath and hypertension.  Patient reports that he saw his primary physician earlier today because "I just did not feel very good."  Patient reports generalized fatigue and some shortness of breath.  He states he noted his blood pressure was high at his primary physician 423 systolic.  Tonight while at home he continued to feel poorly and took his blood pressure.  He reports systolic blood pressures in the 200s.  He states he has never had blood pressure that high.  At that time he did not have any chest pain but did have some shortness of breath.  He denies any lower extremity swelling or history of blood clots.  He denies any chest pain at this time.  He did have one episode of left-sided chest pain that was self-limited earlier today.  Denies any recent fevers or cough.  Past Medical History:  Diagnosis Date  . Coronary artery disease   . Diabetes mellitus   . GERD (gastroesophageal reflux disease)   . History of nuclear stress test 04/04/2011   lexiscan; mod-large in size fixed inferolateral defect (scar); non-diagnostic for ischemia; low risk scan   . Hyperlipidemia   . Hypertension   . Left foot drop    r/t past disk srugery - uses Kevlar brace  . Myocardial infarction ([place])    posterior MI  . Shortness of breath   . Sleep apnea    on CPAP; 04/28/2007 split-night - AHI during total sleep 44.43/hr and REM 72.56/hr    Patient Active Problem List   Diagnosis Date Noted  . Acoustic neuroma ([place]) 09/11/2017  . Asymmetrical sensorineural hearing loss 09/11/2017  . Encounter for cardioversion procedure   .  Anticoagulation adequate 06/06/2016  . Fatigue 10/27/2015  . Bradycardia 10/27/2015  . Hyperlipidemia LDL goal <70 05/20/2015  . OSA on CPAP 07/23/2013  . Excessive daytime sleepiness 07/23/2013  . Hypertension   . Hyperlipidemia   . Diabetes mellitus   . Chest pain 08/18/2012  . Tobacco abuse 08/18/2012  . Unstable angina, cath [facility] 02/27/12 02/26/2012  . CAD, multiple prior RCA PCI's. Last cath 2010, Myoview low risk Oct 2012 02/26/2012  . IDDM (insulin dependent diabetes mellitus) ([place]) 02/26/2012  . HTN (hypertension) 02/26/2012  . Dyslipidemia, low HDL. Intol to fish oil and Noacin 02/26/2012  . Sleep apnea, on C-pap 02/26/2012  . COPD (chronic obstructive pulmonary disease) ([place]) 02/26/2012  . Smoking, quit one week ago 02/26/2012  . GERD (gastroesophageal reflux disease) 02/26/2012    Past Surgical History:  Procedure Laterality Date  . BACK SURGERY  1985  . CARDIAC CATHETERIZATION  2010   6 stents total  . CARDIAC CATHETERIZATION  01/2000   percutaneous transluminal coronary balloon angioplasty of mid RCA stenotic lesion  . CARDIAC CATHETERIZATION  06/2006   no stenting; ischemic cardiomyopathy, EF 40-45%  . CARDIOVERSION N/A 07/28/2016   Procedure: CARDIOVERSION;  Surgeon: Troy Sine, MD;  Location: [place];  Service: Cardiovascular;  Laterality: N/A;  . CORONARY ANGIOPLASTY  09/1998   mid-distal RCA balloon dilatation, 4.5 & 5.0 stents   . CORONARY ANGIOPLASTY WITH  STENT PLACEMENT  03/1994   angioplasty & stenting (non-DES) of circumflex/prox ramus intermedius  . CORONARY ANGIOPLASTY WITH STENT PLACEMENT  10/1994   large iliac [id number] stent to RCA  . CORONARY ANGIOPLASTY WITH STENT PLACEMENT  12/2002   4.56mm stents x2 of RCA  . CORONARY ANGIOPLASTY WITH STENT PLACEMENT  01/2005   cutting balloon arthrectomy of distal RCA & Cypher DES 3.5x13; cutting balloon arthrectomy of mid RCA with Cypher DES 3.5x18  . CORONARY ANGIOPLASTY WITH STENT PLACEMENT  11/2008   stenting  of mid RCA with 4.0x73mm driver, non-DES  . LEFT HEART CATHETERIZATION WITH CORONARY ANGIOGRAM N/A 02/27/2012   Procedure: LEFT HEART CATHETERIZATION WITH CORONARY ANGIOGRAM;  Surgeon: Lorretta Harp, MD;  Location: [facility] CATH LAB;  Service: Cardiovascular;  Laterality: N/A;  . TRANSTHORACIC ECHOCARDIOGRAM  07/29/2010   EF 50=55%, mod inf wall hypokinesis & mild post wall hypokinesis; LA mild-mod dilated; mild mitral annular calcif & mild MR; mild TR & elevated RV systolic pressure; AV mildly sclerotic; mild aortic root dilatation         Home Medications    Prior to Admission medications   Medication Sig Start Date End Date Taking? Authorizing Provider  amiodarone (PACERONE) 200 MG tablet Take 1 tablet (200 mg total) by mouth 2 (two) times daily. 07/20/16  Yes Troy Sine, MD  aspirin 81 MG tablet Take 81 mg by mouth daily.   Yes [provider]  BYSTOLIC 5 MG tablet TAKE 1 TABLET BY MOUTH EVERY DAY 06/04/17  Yes Troy Sine, MD  ELIQUIS 5 MG TABS tablet TAKE 1 TABLET BY MOUTH TWICE A DAY 05/29/17  Yes Troy Sine, MD  Insulin Detemir (LEVEMIR FLEXTOUCH) 100 UNIT/ML Pen Inject 34 Units into the skin daily. ICD E11.65 07/16/17  Yes [name], MD  montelukast (SINGULAIR) 10 MG tablet TAKE 1 TABLET BY MOUTH EVERY DAY 08/06/17  Yes [name], MD  mupirocin ointment (BACTROBAN) 2 % Place 1 application into the nose 2 (two) times daily. 09/11/17  Yes Delsa Grana, PA-C  nitroGLYCERIN (NITROSTAT) 0.4 MG SL tablet Place 0.4 mg under the tongue every 5 (five) minutes as needed for chest pain.   Yes [provider]  rosuvastatin (CRESTOR) 20 MG tablet Take 1 tablet (20 mg total) by mouth daily. 07/20/16 09/11/17 Yes Troy Sine, MD  furosemide (LASIX) 20 MG tablet Take 1 tablet (20 mg total) by mouth 2 (two) times daily. 09/12/17   Horton, Barbette Hair, MD  glucose blood (ONE TOUCH ULTRA TEST) test strip USE 1 STRIP 3 TIMES DAILY - USE AS INSTRUCTED DX: E11.9 07/23/17    [name], MD  Insulin Pen Needle (NOVOFINE) 32G X 6 MM MISC 1 each by Other route daily. 02/20/17   [name], MD    Family History Family History  Problem Relation Age of Onset  . Heart attack Father     Social History Social History   Tobacco Use  . Smoking status: Former Smoker    Packs/day: 1.00    Years: 50.00    Pack years: 50.00    Types: Cigarettes    Last attempt to quit: 08/01/2017    Years since quitting: 0.1  . Smokeless tobacco: Never Used  Substance Use Topics  . Alcohol use: Yes    Alcohol/week: 0.0 oz    Comment: occasionally  . Drug use: No     Allergies   Benazepril; Fish allergy; Omega-3 fatty acids; and Fish oil  Review of Systems Review of Systems  Constitutional: Negative for fever.  Respiratory: Positive for shortness of breath. Negative for cough.   Cardiovascular: Negative for chest pain and leg swelling.  Gastrointestinal: Negative for abdominal pain.  Genitourinary: Negative for dysuria.  Neurological: Positive for weakness. Negative for headaches.  All other systems reviewed and are negative.    Physical Exam Updated Vital Signs BP (!) 158/84   Pulse (!) 51   Temp 98.8 F (37.1 C) (Oral)   Resp (!) 26   Ht 6' (1.829 m)   Wt 104.8 kg (231 lb)   SpO2 95%   BMI 31.33 kg/m   Physical Exam  Constitutional: He is oriented to person, place, and time. He appears well-developed and well-nourished. He does not appear ill.  HENT:  Head: Normocephalic and atraumatic.  Cardiovascular: Normal rate, regular rhythm, normal heart sounds and normal pulses.  No murmur heard. Pulmonary/Chest: Effort normal and breath sounds normal. No respiratory distress. He has no decreased breath sounds. He has no wheezes.  Abdominal: Soft. Bowel sounds are normal. There is no tenderness. There is no rebound.  Musculoskeletal: He exhibits no edema.  Trace bilateral lower extremity edema  Lymphadenopathy:    He has no cervical  adenopathy.  Neurological: He is alert and oriented to person, place, and time.  Skin: Skin is warm and dry.  Psychiatric: He has a normal mood and affect.  Nursing note and vitals reviewed.    ED Treatments / Results  Labs (all labs ordered are listed, but only abnormal results are displayed) Labs Reviewed  BASIC METABOLIC PANEL - Abnormal; Notable for the following components:      Result Value   Glucose, Bld 171 (*)    Creatinine, Ser 1.56 (*)    GFR calc non Af Amer 44 (*)    GFR calc Af Amer 51 (*)    All other components within normal limits  BRAIN NATRIURETIC PEPTIDE - Abnormal; Notable for the following components:   B Natriuretic Peptide 445.0 (*)    All other components within normal limits  CBC  TROPONIN I  TROPONIN I  I-STAT TROPONIN, ED    EKG EKG Interpretation  Date/Time:  Tuesday September 11 2017 22:23:25 EDT Ventricular Rate:  61 PR Interval:    QRS Duration: 127 QT Interval:  517 QTC Calculation: 521 R Axis:   88 Text Interpretation:  Sinus rhythm Nonspecific intraventricular conduction delay Inferolateral infarct, old Confirmed by Thayer Jew [phone]) on 09/11/2017 11:05:39 PM   Radiology Dg Chest 2 View  Result Date: 09/11/2017 CLINICAL DATA:  67 year old male with chest pain and shortness of breath. EXAM: CHEST - 2 VIEW COMPARISON:  Chest radiograph dated 06/04/2017 FINDINGS: There is cardiomegaly with mild vascular congestion and interstitial edema. Superimposed pneumonia is not excluded. Clinical correlation is recommended. There is no focal consolidation, or pneumothorax. Trace pleural effusions may be present. Degenerative changes of the spine. No acute osseous pathology. IMPRESSION: Cardiomegaly with findings of CHF. Electronically Signed   By: Anner Crete M.D.   On: 09/11/2017 23:14    Procedures Procedures (including critical care time)  Medications Ordered in ED Medications  furosemide (LASIX) tablet 40 mg (40 mg Oral Given 09/12/17  0119)     Initial Impression / Assessment and Plan / ED Course  I have reviewed the triage vital signs and the nursing notes.  Pertinent labs & imaging results that were available during my care of the patient were reviewed by me and considered in my  medical decision making (see chart for details).     Patient presents with concerns for high blood pressure.  States he generally does not feel well and has had some shortness of breath.  He is overall nontoxic appearing.  EKG is nonischemic.  Troponin is negative.  Chest x-ray is concerning for CHF.  He does not look overtly volume overloaded.  BNP is also mildly elevated.  Given his shortness of breath, this could be the culprit.  Patient was given a dose of Lasix.  Repeat troponin at 3 hours remains negative and he has not had any ongoing chest pain.  Suspect symptoms may be related to acute on chronic systolic heart failure.  Will start on Lasix daily for the next 4 days.  Follow-up with cardiology and primary physician recommended.  He is in no acute distress with normal O2 sats upon discharge.  Regarding his blood pressure, last blood pressure recorded 158/84.  He will need to follow-up with his primary physician regarding any medication adjustments.  After history, exam, and medical workup I feel the patient has been appropriately medically screened and is safe for discharge home. Pertinent diagnoses were discussed with the patient. Patient was given return precautions.  Final Clinical Impressions(s) / ED Diagnoses   Final diagnoses:  Acute on chronic systolic heart failure (HCC)  SOB (shortness of breath)  Essential hypertension    ED Discharge Orders        Ordered    furosemide (LASIX) 20 MG tablet  2 times daily     09/12/17 0226       Merryl Hacker, MD 09/12/17 [phone]

## 2017-09-11 NOTE — ED Triage Notes (Signed)
Pt c/o chest pain with sob since yesterday and high blood pressure that started today. Pt states he seen his pcp for the same today.

## 2017-09-12 ENCOUNTER — Other Ambulatory Visit: Payer: Self-pay | Admitting: Family Medicine

## 2017-09-12 ENCOUNTER — Telehealth: Payer: Self-pay | Admitting: Cardiovascular Disease

## 2017-09-12 DIAGNOSIS — I110 Hypertensive heart disease with heart failure: Secondary | ICD-10-CM | POA: Diagnosis not present

## 2017-09-12 LAB — BASIC METABOLIC PANEL
BUN: 12 mg/dL (ref 7–25)
CALCIUM: 8.6 mg/dL (ref 8.6–10.3)
CHLORIDE: 106 mmol/L (ref 98–110)
CO2: 24 mmol/L (ref 20–32)
Creat: 1.2 mg/dL (ref 0.70–1.25)
GLUCOSE: 191 mg/dL — AB (ref 65–99)
Potassium: 4.1 mmol/L (ref 3.5–5.3)
SODIUM: 139 mmol/L (ref 135–146)

## 2017-09-12 LAB — CBC WITH DIFFERENTIAL/PLATELET
BASOS ABS: 84 {cells}/uL (ref 0–200)
Basophils Relative: 0.9 %
EOS ABS: 260 {cells}/uL (ref 15–500)
EOS PCT: 2.8 %
HCT: 41.9 % (ref 38.5–50.0)
HEMOGLOBIN: 13.8 g/dL (ref 13.2–17.1)
Lymphs Abs: 1646 cells/uL (ref 850–3900)
MCH: 29.3 pg (ref 27.0–33.0)
MCHC: 32.9 g/dL (ref 32.0–36.0)
MCV: 89 fL (ref 80.0–100.0)
MONOS PCT: 4.2 %
MPV: 11.9 fL (ref 7.5–12.5)
NEUTROS ABS: 6919 {cells}/uL (ref 1500–7800)
NEUTROS PCT: 74.4 %
PLATELETS: 173 10*3/uL (ref 140–400)
RBC: 4.71 10*6/uL (ref 4.20–5.80)
RDW: 13.5 % (ref 11.0–15.0)
TOTAL LYMPHOCYTE: 17.7 %
WBC mixed population: 391 cells/uL (ref 200–950)
WBC: 9.3 10*3/uL (ref 3.8–10.8)

## 2017-09-12 LAB — TSH: TSH: 1.72 m[IU]/L (ref 0.40–4.50)

## 2017-09-12 LAB — TESTOSTERONE: TESTOSTERONE: 654 ng/dL (ref 250–827)

## 2017-09-12 LAB — BRAIN NATRIURETIC PEPTIDE: B Natriuretic Peptide: 445 pg/mL — ABNORMAL HIGH (ref 0.0–100.0)

## 2017-09-12 LAB — TROPONIN I

## 2017-09-12 MED ORDER — LOSARTAN POTASSIUM 50 MG PO TABS: 50.0000 mg | ORAL_TABLET | Freq: Every day | ORAL | 3 refills | Status: DC

## 2017-09-12 MED ORDER — FUROSEMIDE 20 MG PO TABS: 20.0000 mg | ORAL_TABLET | Freq: Two times a day (BID) | ORAL | 0 refills | Status: DC

## 2017-09-12 MED ORDER — FUROSEMIDE 40 MG PO TABS
40.0000 mg | ORAL_TABLET | Freq: Once | ORAL | Status: AC
  Administered 2017-09-12: 40 mg via ORAL
  Filled 2017-09-12: qty 1

## 2017-09-12 NOTE — Telephone Encounter (Signed)
Incoming call from the patient. He stated that he went to the ED yesterday with high blood pressure, at home it was 205/105. He was asymptomatic except for slight shortness of breath.   Troponin was negative, BNP was 445. The patient was started on furosemide 20 mg bid for the next four days. He was instructed to follow up with his cardiologist this week. There was nothing available at that time so an appointment has been made for 4/2 with a PA.  This morning the patient's blood pressure was 187/90. He had not taken his Bystolic 5mg  yet. He has been instructed to take his medication and call back in a few hours with an updated blood pressure.

## 2017-09-12 NOTE — Telephone Encounter (Signed)
Recommend increasing Bystolic to 10 mg as long as heart rate is above 60.  We will reevaluate next week at office visit to determine if additional agent is necessary.

## 2017-09-12 NOTE — Telephone Encounter (Signed)
Patient called in with a blood pressure update. After taking his Bystolic 5mg  his blood pressure was 187/86. Message will be routed to the provider for further recommendation.

## 2017-09-12 NOTE — Telephone Encounter (Signed)
Pt c/o BP issue: STAT if pt c/o blurred vision, one-sided weakness or slurred speech  1. What are your last 5 BP readings? 205/105     187/92  2. Are you having any other symptoms (ex. Dizziness, headache, blurred vision, passed out)?  no 3. What is your BP issue? bp high pt went to the ER at [facility] 09-11-2017

## 2017-09-12 NOTE — Addendum Note (Signed)
Addended by: Jenna Luo T on: 09/12/2017 04:55 PM   Modules accepted: Orders

## 2017-09-12 NOTE — Assessment & Plan Note (Signed)
BP elevated, today SBP 150, same as last clinic visit in February.  No CP, SOB, near syncope, LE edema.  Will monitor and recheck in 1-2 weeks, may need additional med after recent d/c of ACEI.

## 2017-09-12 NOTE — Progress Notes (Addendum)
Lab work unremarkable - patient will be notified Random glucose elevated - pt continue to monitor Electrolytes normal H/H decreased from recent labs, however still within normal range.  Will monitor. Kidney function good, Testosterone and Thyroid labs WNL Can ask patient to return for follow up visit in 1-2 weeks to recheck sugars, BP, HR and fatigue sx  Patient was seen in conjunction with Delsa Grana.  Unfortunately, the patient went to the emergency room last night with elevated blood pressure and an elevated BNP.  He has been discharged home on Lasix.  However communication with his cardiology office reveals elevated blood pressures today.  I will call the patient and recommend starting losartan 50 mg a day to better manage his blood pressure and then recheck him here in 1 week on the medication and also follow-up with BNP/BMP.

## 2017-09-12 NOTE — Telephone Encounter (Signed)
Left a message to call back.

## 2017-09-12 NOTE — Discharge Instructions (Addendum)
You were seen today with concerns for high blood pressure and shortness of breath.  Your heart testing is largely reassuring although you do show some signs of acute heart failure.  You have some fluid on her lungs.  You will be started on Lasix twice a day for the next 4 days.  Follow-up closely with your primary doctor and cardiologist for repeat evaluation and medication adjustment.  If you have any new or worsening symptoms, increasing shortness of breath, chest pain, you should be reevaluated immediately.

## 2017-09-13 ENCOUNTER — Other Ambulatory Visit: Payer: Self-pay | Admitting: Cardiovascular Disease

## 2017-09-13 MED ORDER — NITROGLYCERIN 0.4 MG SL SUBL: 0.4000 mg | SUBLINGUAL_TABLET | SUBLINGUAL | 1 refills | Status: DC | PRN

## 2017-09-13 NOTE — Telephone Encounter (Signed)
°*  STAT* If patient is at the pharmacy, call can be transferred to refill team.   1. Which medications need to be refilled? (please list name of each medication and dose if known) nitroGLYCERIN (NITROSTAT) 0.4 MG SL tablet  2. Which pharmacy/location (including street and city if local pharmacy) is medication to be sent to? [id number] - [place],  - [place]  3. Do they need a 30 day or 90 day supply? [place]

## 2017-09-14 NOTE — Telephone Encounter (Signed)
Patient called back stating that his PCP put him on a new blood pressure medicine and that his blood pressure is now under control. He did not know the name but stated that he would call back with an update. He has requested that his appointment be cancelled for 09/18/17 with the APP.

## 2017-09-14 NOTE — Telephone Encounter (Signed)
Left message to call back  

## 2017-09-18 ENCOUNTER — Ambulatory Visit: Payer: Medicare Other | Admitting: Physician Assistant

## 2017-09-20 ENCOUNTER — Ambulatory Visit: Payer: Medicare Other | Admitting: Family Medicine

## 2017-09-20 ENCOUNTER — Other Ambulatory Visit: Payer: Medicare Other

## 2017-09-21 ENCOUNTER — Ambulatory Visit (INDEPENDENT_AMBULATORY_CARE_PROVIDER_SITE_OTHER): Payer: Medicare Other | Admitting: Family Medicine

## 2017-09-21 ENCOUNTER — Ambulatory Visit: Payer: Medicare Other | Admitting: Family Medicine

## 2017-09-21 ENCOUNTER — Encounter: Payer: Self-pay | Admitting: Family Medicine

## 2017-09-21 VITALS — BP 146/84 | HR 58 | Temp 98.2°F | Resp 14 | Ht 72.0 in | Wt 229.0 lb

## 2017-09-21 DIAGNOSIS — I5022 Chronic systolic (congestive) heart failure: Secondary | ICD-10-CM

## 2017-09-21 LAB — BASIC METABOLIC PANEL
BUN / CREAT RATIO: 13 (calc) (ref 6–22)
BUN: 17 mg/dL (ref 7–25)
CHLORIDE: 108 mmol/L (ref 98–110)
CO2: 28 mmol/L (ref 20–32)
CREATININE: 1.32 mg/dL — AB (ref 0.70–1.25)
Calcium: 9 mg/dL (ref 8.6–10.3)
Glucose, Bld: 119 mg/dL — ABNORMAL HIGH (ref 65–99)
POTASSIUM: 4.7 mmol/L (ref 3.5–5.3)
Sodium: 143 mmol/L (ref 135–146)

## 2017-09-21 NOTE — Progress Notes (Signed)
Subjective:    Patient ID: John [name], male    DOB: [birth year], 67 y.o.   MRN: [id number]  HPI Patient was recently in the hospital with shortness of breath and an acute elevation of his blood pressure.  Chest x-ray revealed cardiomegaly with pulmonary edema.  He was diuresed on Lasix.  BNP was greater than 200.  We started him on losartan 50 mg a day the following day to bring his blood pressure down.  He is here today for a blood pressure check.  Blood pressure remains slightly elevated at 146/84.  He continues to see similar numbers at home.  On exam, heart is in regular rhythm today.  However there continues to be diffuse scattered faint rales.   Past Medical History:  Diagnosis Date  . Coronary artery disease   . Diabetes mellitus   . GERD (gastroesophageal reflux disease)   . History of nuclear stress test 04/04/2011   lexiscan; mod-large in size fixed inferolateral defect (scar); non-diagnostic for ischemia; low risk scan   . Hyperlipidemia   . Hypertension   . Left foot drop    r/t past disk srugery - uses Kevlar brace  . Myocardial infarction ([place])    posterior MI  . Shortness of breath   . Sleep apnea    on CPAP; 04/28/2007 split-night - AHI during total sleep 44.43/hr and REM 72.56/hr   Past Surgical History:  Procedure Laterality Date  . BACK SURGERY  1985  . CARDIAC CATHETERIZATION  2010   6 stents total  . CARDIAC CATHETERIZATION  01/2000   percutaneous transluminal coronary balloon angioplasty of mid RCA stenotic lesion  . CARDIAC CATHETERIZATION  06/2006   no stenting; ischemic cardiomyopathy, EF 40-45%  . CARDIOVERSION N/A 07/28/2016   Procedure: CARDIOVERSION;  Surgeon: Troy Sine, MD;  Location: [facility];  Service: Cardiovascular;  Laterality: N/A;  . CORONARY ANGIOPLASTY  09/1998   mid-distal RCA balloon dilatation, 4.5 & 5.0 stents   . CORONARY ANGIOPLASTY WITH STENT PLACEMENT  03/1994   angioplasty & stenting (non-DES) of circumflex/prox ramus  intermedius  . CORONARY ANGIOPLASTY WITH STENT PLACEMENT  10/1994   large iliac [id number] stent to RCA  . CORONARY ANGIOPLASTY WITH STENT PLACEMENT  12/2002   4.42mm stents x2 of RCA  . CORONARY ANGIOPLASTY WITH STENT PLACEMENT  01/2005   cutting balloon arthrectomy of distal RCA & Cypher DES 3.5x13; cutting balloon arthrectomy of mid RCA with Cypher DES 3.5x18  . CORONARY ANGIOPLASTY WITH STENT PLACEMENT  11/2008   stenting of mid RCA with 4.0x75mm driver, non-DES  . LEFT HEART CATHETERIZATION WITH CORONARY ANGIOGRAM N/A 02/27/2012   Procedure: LEFT HEART CATHETERIZATION WITH CORONARY ANGIOGRAM;  Surgeon: Lorretta Harp, MD;  Location: [facility] CATH LAB;  Service: Cardiovascular;  Laterality: N/A;  . TRANSTHORACIC ECHOCARDIOGRAM  07/29/2010   EF 50=55%, mod inf wall hypokinesis & mild post wall hypokinesis; LA mild-mod dilated; mild mitral annular calcif & mild MR; mild TR & elevated RV systolic pressure; AV mildly sclerotic; mild aortic root dilatation    Current Outpatient Medications on File Prior to Visit  Medication Sig Dispense Refill  . aspirin 81 MG tablet Take 81 mg by mouth daily.    [name] BYSTOLIC 5 MG tablet TAKE 1 TABLET BY MOUTH EVERY DAY 30 tablet 9  . ELIQUIS 5 MG TABS tablet TAKE 1 TABLET BY MOUTH TWICE A DAY 180 tablet 1  . furosemide (LASIX) 20 MG tablet Take 1 tablet (20 mg total) by mouth 2 (  two) times daily. 8 tablet 0  . glucose blood (ONE TOUCH ULTRA TEST) test strip USE 1 STRIP 3 TIMES DAILY - USE AS INSTRUCTED DX: E11.9 100 each 2  . Insulin Detemir (LEVEMIR FLEXTOUCH) 100 UNIT/ML Pen Inject 34 Units into the skin daily. ICD E11.65 15 pen 3  . Insulin Pen Needle (NOVOFINE) 32G X 6 MM MISC 1 each by Other route daily. 100 each 3  . losartan (COZAAR) 50 MG tablet Take 1 tablet (50 mg total) by mouth daily. 90 tablet 3  . montelukast (SINGULAIR) 10 MG tablet TAKE 1 TABLET BY MOUTH EVERY DAY 90 tablet 3  . nitroGLYCERIN (NITROSTAT) 0.4 MG SL tablet Place 1 tablet (0.4 mg total) under  the tongue every 5 (five) minutes as needed for chest pain. 25 tablet 1  . rosuvastatin (CRESTOR) 20 MG tablet Take 1 tablet (20 mg total) by mouth daily. 90 tablet 3   No current facility-administered medications on file prior to visit.    Allergies  Allergen Reactions  . Benazepril Other (See Comments)    hyperkalemia  . Fish Allergy Itching  . Omega-3 Fatty Acids Hives and Itching  . Fish Oil Itching and Rash   Social History   Socioeconomic History  . Marital status: Married    Spouse name: Not on file  . Number of children: 3  . Years of education: Not on file  . Highest education level: Not on file  Occupational History  . 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 [employer]  Social Needs  . Financial resource strain: Not on file  . Food insecurity:    Worry: Not on file    Inability: Not on file  . Transportation needs:    Medical: Not on file    Non-medical: Not on file  Tobacco Use  . Smoking status: Former Smoker    Packs/day: 1.00    Years: 50.00    Pack years: 50.00    Types: Cigarettes    Last attempt to quit: 08/01/2017    Years since quitting: 0.1  . Smokeless tobacco: Never Used  Substance and Sexual Activity  . Alcohol use: Yes    Alcohol/week: 0.0 oz    Comment: occasionally  . Drug use: No  . Sexual activity: Yes  Lifestyle  . Physical activity:    Days per week: Not on file    Minutes per session: Not on file  . Stress: Not on file  Relationships  . Social connections:    Talks on phone: Not on file    Gets together: Not on file    Attends religious service: Not on file    Active member of club or organization: Not on file    Attends meetings of clubs or organizations: Not on file    Relationship status: Not on file  . Intimate partner violence:    Fear of current or ex partner: Not on file    Emotionally abused: Not on file    Physically abused: Not on file    Forced sexual activity: Not on file  Other  Topics Concern  . Not on file  Social History Narrative  . Not on file      Review of Systems  All other systems reviewed and are negative.      Objective:   Physical Exam  Cardiovascular: Normal rate, regular rhythm and normal heart sounds.  Pulmonary/Chest: Effort normal and breath sounds normal. No respiratory  distress. He has no wheezes. He has no rales.  Abdominal: Soft. Bowel sounds are normal.  Musculoskeletal: He exhibits no edema.  Vitals reviewed.         Assessment & Plan:  Chronic systolic congestive heart failure (HCC) - Plan: Brain natriuretic peptide, Basic Metabolic Panel  Increase losartan to 100 mg a day and recheck blood pressure in 1-2 weeks.  Repeat BMP and BNP to monitor renal function, potassium.

## 2017-09-22 LAB — BRAIN NATRIURETIC PEPTIDE: Brain Natriuretic Peptide: 282 pg/mL — ABNORMAL HIGH (ref <100)

## 2017-10-08 ENCOUNTER — Ambulatory Visit: Payer: Medicare Other | Admitting: Family Medicine

## 2017-10-15 ENCOUNTER — Encounter: Payer: Self-pay | Admitting: Family Medicine

## 2017-10-15 ENCOUNTER — Ambulatory Visit (INDEPENDENT_AMBULATORY_CARE_PROVIDER_SITE_OTHER): Payer: Medicare Other | Admitting: Family Medicine

## 2017-10-15 VITALS — BP 130/70 | HR 56 | Temp 98.0°F | Resp 18 | Ht 72.0 in | Wt 228.0 lb

## 2017-10-15 DIAGNOSIS — I5022 Chronic systolic (congestive) heart failure: Secondary | ICD-10-CM | POA: Diagnosis not present

## 2017-10-15 DIAGNOSIS — R0609 Other forms of dyspnea: Secondary | ICD-10-CM | POA: Diagnosis not present

## 2017-10-15 DIAGNOSIS — R5383 Other fatigue: Secondary | ICD-10-CM

## 2017-10-15 NOTE — Progress Notes (Signed)
Subjective:    Patient ID: John [name], male    DOB: 1951-04-10, 67 y.o.   MRN: [id number]  HPI  09/21/17 Patient was recently in the hospital with shortness of breath and an acute elevation of his blood pressure.  Chest x-ray revealed cardiomegaly with pulmonary edema.  He was diuresed on Lasix.  BNP was greater than 200.  We started him on losartan 50 mg a day the following day to bring his blood pressure down.  He is here today for a blood pressure check.  Blood pressure remains slightly elevated at 146/84.  He continues to see similar numbers at home.  On exam, heart is in regular rhythm today.  However there continues to be diffuse scattered faint rales.  At that time, my plan was: Increase losartan to 100 mg a day and recheck blood pressure in 1-2 weeks.  Repeat BMP and BNP to monitor renal function, potassium.  10/15/17 Continues to report SOB.  Nuclear stress test was performed in 04/2017 revealed:  Study Highlights    Nuclear stress EF: 34%. The left ventricular ejection fraction is moderately decreased (30-44%).  Defect 1: There is a defect present in the basal inferior, basal inferolateral, mid inferior, mid inferolateral and apical inferior location.  Findings consistent with prior inferolateral myocardial infarction.  This is an intermediate risk study.   Echo performed in 04/2017 revealed: - Left ventricle: The cavity size was at the upper limits of   normal. Wall thickness was increased in a pattern of mild LVH.   Basal to mid inferior and inferolateral akinesis. Basal to mid   anterolateral hypokinesis. Systolic function was mildly to   moderately reduced. The estimated ejection fraction was in the   range of 40% to 45%. Doppler parameters are consistent with   abnormal left ventricular relaxation (grade 1 diastolic   dysfunction). - Aortic valve: There was no stenosis. - Aorta: Borderline dilated aortic root. Aortic root dimension: 37   mm (ED). - Mitral valve:  There was trivial regurgitation. - Left atrium: The atrium was mildly dilated. - Right ventricle: The cavity size was normal. Systolic function   was normal. - Right atrium: The atrium was mildly to moderately dilated. - Tricuspid valve: Peak RV-RA gradient (S): 28 mm Hg. - Pulmonary arteries: PA peak pressure: 31 mm Hg (S). - Inferior vena cava: The vessel was normal in size. The   respirophasic diameter changes were in the normal range (>= 50%),   consistent with normal central venous pressure.  Past Medical History:  Diagnosis Date  . Coronary artery disease   . Diabetes mellitus   . GERD (gastroesophageal reflux disease)   . History of nuclear stress test 04/04/2011   lexiscan; mod-large in size fixed inferolateral defect (scar); non-diagnostic for ischemia; low risk scan   . Hyperlipidemia   . Hypertension   . Left foot drop    r/t past disk srugery - uses Kevlar brace  . Myocardial infarction ([place])    posterior MI  . Shortness of breath   . Sleep apnea    on CPAP; 04/28/2007 split-night - AHI during total sleep 44.43/hr and REM 72.56/hr   Past Surgical History:  Procedure Laterality Date  . BACK SURGERY  1985  . CARDIAC CATHETERIZATION  2010   6 stents total  . CARDIAC CATHETERIZATION  01/2000   percutaneous transluminal coronary balloon angioplasty of mid RCA stenotic lesion  . CARDIAC CATHETERIZATION  06/2006   no stenting; ischemic cardiomyopathy, EF 40-45%  .  CARDIOVERSION N/A 07/28/2016   Procedure: CARDIOVERSION;  Surgeon: Troy Sine, MD;  Location: [facility];  Service: Cardiovascular;  Laterality: N/A;  . CORONARY ANGIOPLASTY  09/1998   mid-distal RCA balloon dilatation, 4.5 & 5.0 stents   . CORONARY ANGIOPLASTY WITH STENT PLACEMENT  03/1994   angioplasty & stenting (non-DES) of circumflex/prox ramus intermedius  . CORONARY ANGIOPLASTY WITH STENT PLACEMENT  10/1994   large iliac [id number] stent to RCA  . CORONARY ANGIOPLASTY WITH STENT PLACEMENT  12/2002    4.69mm stents x2 of RCA  . CORONARY ANGIOPLASTY WITH STENT PLACEMENT  01/2005   cutting balloon arthrectomy of distal RCA & Cypher DES 3.5x13; cutting balloon arthrectomy of mid RCA with Cypher DES 3.5x18  . CORONARY ANGIOPLASTY WITH STENT PLACEMENT  11/2008   stenting of mid RCA with 4.0x75mm driver, non-DES  . LEFT HEART CATHETERIZATION WITH CORONARY ANGIOGRAM N/A 02/27/2012   Procedure: LEFT HEART CATHETERIZATION WITH CORONARY ANGIOGRAM;  Surgeon: Lorretta Harp, MD;  Location: [facility] CATH LAB;  Service: Cardiovascular;  Laterality: N/A;  . TRANSTHORACIC ECHOCARDIOGRAM  07/29/2010   EF 50=55%, mod inf wall hypokinesis & mild post wall hypokinesis; LA mild-mod dilated; mild mitral annular calcif & mild MR; mild TR & elevated RV systolic pressure; AV mildly sclerotic; mild aortic root dilatation    Current Outpatient Medications on File Prior to Visit  Medication Sig Dispense Refill  . aspirin 81 MG tablet Take 81 mg by mouth daily.    [name] BYSTOLIC 5 MG tablet TAKE 1 TABLET BY MOUTH EVERY DAY 30 tablet 9  . ELIQUIS 5 MG TABS tablet TAKE 1 TABLET BY MOUTH TWICE A DAY 180 tablet 1  . furosemide (LASIX) 20 MG tablet Take 1 tablet (20 mg total) by mouth 2 (two) times daily. 8 tablet 0  . glucose blood (ONE TOUCH ULTRA TEST) test strip USE 1 STRIP 3 TIMES DAILY - USE AS INSTRUCTED DX: E11.9 100 each 2  . Insulin Detemir (LEVEMIR FLEXTOUCH) 100 UNIT/ML Pen Inject 34 Units into the skin daily. ICD E11.65 15 pen 3  . Insulin Pen Needle (NOVOFINE) 32G X 6 MM MISC 1 each by Other route daily. 100 each 3  . losartan (COZAAR) 50 MG tablet Take 1 tablet (50 mg total) by mouth daily. 90 tablet 3  . montelukast (SINGULAIR) 10 MG tablet TAKE 1 TABLET BY MOUTH EVERY DAY 90 tablet 3  . nitroGLYCERIN (NITROSTAT) 0.4 MG SL tablet Place 1 tablet (0.4 mg total) under the tongue every 5 (five) minutes as needed for chest pain. 25 tablet 1  . rosuvastatin (CRESTOR) 20 MG tablet Take 1 tablet (20 mg total) by mouth daily. 90  tablet 3   No current facility-administered medications on file prior to visit.    Allergies  Allergen Reactions  . Benazepril Other (See Comments)    hyperkalemia  . Fish Allergy Itching  . Omega-3 Fatty Acids Hives and Itching  . Fish Oil Itching and Rash   Social History   Socioeconomic History  . Marital status: Married    Spouse name: Not on file  . Number of children: 3  . Years of education: Not on file  . Highest education level: Not on file  Occupational History  . 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 [employer]  Social Needs  . Financial resource strain: Not on file  . Food insecurity:    Worry: Not on file    Inability: Not on  file  . Transportation needs:    Medical: Not on file    Non-medical: Not on file  Tobacco Use  . Smoking status: Former Smoker    Packs/day: 1.00    Years: 50.00    Pack years: 50.00    Types: Cigarettes    Last attempt to quit: 08/01/2017    Years since quitting: 0.2  . Smokeless tobacco: Never Used  Substance and Sexual Activity  . Alcohol use: Yes    Alcohol/week: 0.0 oz    Comment: occasionally  . Drug use: No  . Sexual activity: Yes  Lifestyle  . Physical activity:    Days per week: Not on file    Minutes per session: Not on file  . Stress: Not on file  Relationships  . Social connections:    Talks on phone: Not on file    Gets together: Not on file    Attends religious service: Not on file    Active member of club or organization: Not on file    Attends meetings of clubs or organizations: Not on file    Relationship status: Not on file  . Intimate partner violence:    Fear of current or ex partner: Not on file    Emotionally abused: Not on file    Physically abused: Not on file    Forced sexual activity: Not on file  Other Topics Concern  . Not on file  Social History Narrative  . Not on file      Review of Systems  All other systems reviewed and are negative.        Objective:   Physical Exam  Cardiovascular: Normal rate, regular rhythm and normal heart sounds.  Pulmonary/Chest: Effort normal and breath sounds normal. No respiratory distress. He has no wheezes. He has no rales.  Abdominal: Soft. Bowel sounds are normal.  Musculoskeletal: He exhibits no edema.  Vitals reviewed.         Assessment & Plan:  Chronic systolic congestive heart failure (HCC)  Fatigue, unspecified type  Dyspnea on exertion - Plan: Ambulatory referral to Pulmonology  I believe dyspnea on exertion is still related to his underlying diminished systolic function/congestive heart failure.  Patient requesst a pulmonology consultation.  I am happy to refer the patient to a pulmonologist.  I did perform pulmonary function tests in the office today that revealed an FEV1 of 2.71 L which is 73% of predicted, and FVC of 3.31 L which is 71% of predicted, and an FEV1 to FVC ratio of 82% which is normal.  No indication of severe copd.  CBC was obtained in March that was completely normal and showed no evidence of anemia.  Exams and laboratory studies so far suggest congestive heart failure as underlying cause.  Therefore I have recommended discontinuation of losartan and replacement with entresto 24/26 pobid in an effort to optimize therapy for CHF while awaiting pulmonology second opinion.  Recheck bmp in 2 weeks to monitor for hyperkalemia given previous reaction to benazepril.

## 2017-11-05 ENCOUNTER — Encounter: Payer: Self-pay | Admitting: Family Medicine

## 2017-11-05 ENCOUNTER — Other Ambulatory Visit: Payer: Self-pay

## 2017-11-05 ENCOUNTER — Ambulatory Visit (INDEPENDENT_AMBULATORY_CARE_PROVIDER_SITE_OTHER): Payer: Medicare Other | Admitting: Family Medicine

## 2017-11-05 VITALS — BP 150/78 | HR 76 | Temp 98.1°F | Resp 14 | Ht 72.0 in | Wt 222.0 lb

## 2017-11-05 DIAGNOSIS — I5022 Chronic systolic (congestive) heart failure: Secondary | ICD-10-CM | POA: Diagnosis not present

## 2017-11-05 LAB — BASIC METABOLIC PANEL WITH GFR
BUN: 18 mg/dL (ref 7–25)
CO2: 30 mmol/L (ref 20–32)
CREATININE: 1.24 mg/dL (ref 0.70–1.25)
Calcium: 9.2 mg/dL (ref 8.6–10.3)
Chloride: 105 mmol/L (ref 98–110)
GFR, Est African American: 69 mL/min/{1.73_m2} (ref >=60)
GFR, Est Non African American: 59 mL/min/{1.73_m2} — ABNORMAL LOW (ref >=60)
Glucose, Bld: 134 mg/dL — ABNORMAL HIGH (ref 65–99)
Potassium: 4.8 mmol/L (ref 3.5–5.3)
Sodium: 142 mmol/L (ref 135–146)

## 2017-11-05 LAB — EXTRA LAV TOP TUBE

## 2017-11-05 NOTE — Progress Notes (Signed)
Subjective:    Patient ID: John [name], male    DOB: 1950/12/11, 67 y.o.   MRN: [id number]  HPI  09/21/17 Patient was recently in the hospital with shortness of breath and an acute elevation of his blood pressure.  Chest x-ray revealed cardiomegaly with pulmonary edema.  He was diuresed on Lasix.  BNP was greater than 200.  We started him on losartan 50 mg a day the following day to bring his blood pressure down.  He is here today for a blood pressure check.  Blood pressure remains slightly elevated at 146/84.  He continues to see similar numbers at home.  On exam, heart is in regular rhythm today.  However there continues to be diffuse scattered faint rales.  At that time, my plan was: Increase losartan to 100 mg a day and recheck blood pressure in 1-2 weeks.  Repeat BMP and BNP to monitor renal function, potassium.  10/15/17 Continues to report SOB.  Nuclear stress test was performed in 04/2017 revealed:  Study Highlights    Nuclear stress EF: 34%. The left ventricular ejection fraction is moderately decreased (30-44%).  Defect 1: There is a defect present in the basal inferior, basal inferolateral, mid inferior, mid inferolateral and apical inferior location.  Findings consistent with prior inferolateral myocardial infarction.  This is an intermediate risk study.   Echo performed in 04/2017 revealed: - Left ventricle: The cavity size was at the upper limits of   normal. Wall thickness was increased in a pattern of mild LVH.   Basal to mid inferior and inferolateral akinesis. Basal to mid   anterolateral hypokinesis. Systolic function was mildly to   moderately reduced. The estimated ejection fraction was in the   range of 40% to 45%. Doppler parameters are consistent with   abnormal left ventricular relaxation (grade 1 diastolic   dysfunction). - Aortic valve: There was no stenosis. - Aorta: Borderline dilated aortic root. Aortic root dimension: 37   mm (ED). - Mitral valve:  There was trivial regurgitation. - Left atrium: The atrium was mildly dilated. - Right ventricle: The cavity size was normal. Systolic function   was normal. - Right atrium: The atrium was mildly to moderately dilated. - Tricuspid valve: Peak RV-RA gradient (S): 28 mm Hg. - Pulmonary arteries: PA peak pressure: 31 mm Hg (S). - Inferior vena cava: The vessel was normal in size. The   respirophasic diameter changes were in the normal range (>= 50%),   consistent with normal central venous pressure.  At that time, my plan was: I believe dyspnea on exertion is still related to his underlying diminished systolic function/congestive heart failure.  Patient requesst a pulmonology consultation.  I am happy to refer the patient to a pulmonologist.  I did perform pulmonary function tests in the office today that revealed an FEV1 of 2.71 L which is 73% of predicted, and FVC of 3.31 L which is 71% of predicted, and an FEV1 to FVC ratio of 82% which is normal.  No indication of severe copd.  CBC was obtained in March that was completely normal and showed no evidence of anemia.  Exams and laboratory studies so far suggest congestive heart failure as underlying cause.  Therefore I have recommended discontinuation of losartan and replacement with entresto 24/26 pobid in an effort to optimize therapy for CHF while awaiting pulmonology second opinion.  Recheck bmp in 2 weeks to monitor for hyperkalemia given previous reaction to benazepril.    11/05/17 Here for follow  up.  Patient states he initially saw benefit after starting Entresto.  He was feeling better, his breathing was better, his stamina was better.  However over the last week or so, he states that his breathing has worsened again.  He continues to report dyspnea on exertion and poor stamina.  He has an appointment tomorrow to see the pulmonologist.  He recently bruised his ribs after a fall.  There is no hemoptysis.  Lung sounds are clear bilaterally.  There  is no evidence of pneumothorax.  Bowel sounds are normal.  There is no visible bruising or swelling or deformity Past Medical History:  Diagnosis Date  . Coronary artery disease   . Diabetes mellitus   . GERD (gastroesophageal reflux disease)   . History of nuclear stress test 04/04/2011   lexiscan; mod-large in size fixed inferolateral defect (scar); non-diagnostic for ischemia; low risk scan   . Hyperlipidemia   . Hypertension   . Left foot drop    r/t past disk srugery - uses Kevlar brace  . Myocardial infarction ([place])    posterior MI  . Shortness of breath   . Sleep apnea    on CPAP; 04/28/2007 split-night - AHI during total sleep 44.43/hr and REM 72.56/hr   Past Surgical History:  Procedure Laterality Date  . BACK SURGERY  1985  . CARDIAC CATHETERIZATION  2010   6 stents total  . CARDIAC CATHETERIZATION  01/2000   percutaneous transluminal coronary balloon angioplasty of mid RCA stenotic lesion  . CARDIAC CATHETERIZATION  06/2006   no stenting; ischemic cardiomyopathy, EF 40-45%  . CARDIOVERSION N/A 07/28/2016   Procedure: CARDIOVERSION;  Surgeon: Troy Sine, MD;  Location: [facility];  Service: Cardiovascular;  Laterality: N/A;  . CORONARY ANGIOPLASTY  09/1998   mid-distal RCA balloon dilatation, 4.5 & 5.0 stents   . CORONARY ANGIOPLASTY WITH STENT PLACEMENT  03/1994   angioplasty & stenting (non-DES) of circumflex/prox ramus intermedius  . CORONARY ANGIOPLASTY WITH STENT PLACEMENT  10/1994   large iliac [id number] stent to RCA  . CORONARY ANGIOPLASTY WITH STENT PLACEMENT  12/2002   4.73mm stents x2 of RCA  . CORONARY ANGIOPLASTY WITH STENT PLACEMENT  01/2005   cutting balloon arthrectomy of distal RCA & Cypher DES 3.5x13; cutting balloon arthrectomy of mid RCA with Cypher DES 3.5x18  . CORONARY ANGIOPLASTY WITH STENT PLACEMENT  11/2008   stenting of mid RCA with 4.0x12mm driver, non-DES  . LEFT HEART CATHETERIZATION WITH CORONARY ANGIOGRAM N/A 02/27/2012   Procedure: LEFT HEART  CATHETERIZATION WITH CORONARY ANGIOGRAM;  Surgeon: Lorretta Harp, MD;  Location: [facility] C[facility] LAB;  Service: Cardiovascular;  Laterality: N/A;  . TRANSTHORACIC ECHOCARDIOGRAM  07/29/2010   EF 50=55%, mod inf wall hypokinesis & mild post wall hypokinesis; LA mild-mod dilated; mild mitral annular calcif & mild MR; mild TR & elevated RV systolic pressure; AV mildly sclerotic; mild aortic root dilatation    Current Outpatient Medications on File Prior to Visit  Medication Sig Dispense Refill  . aspirin 81 MG tablet Take 81 mg by mouth daily.    [name] BYSTOLIC 5 MG tablet TAKE 1 TABLET BY MOUTH EVERY DAY 30 tablet 9  . ELIQUIS 5 MG TABS tablet TAKE 1 TABLET BY MOUTH TWICE A DAY 180 tablet 1  . glucose blood (ONE TOUCH ULTRA TEST) test strip USE 1 STRIP 3 TIMES DAILY - USE AS INSTRUCTED DX: E11.9 100 each 2  . Insulin Detemir (LEVEMIR FLEXTOUCH) 100 UNIT/ML Pen Inject 34 Units into the skin daily.  ICD E11.65 15 pen 3  . Insulin Pen Needle (NOVOFINE) 32G X 6 MM MISC 1 each by Other route daily. 100 each 3  . losartan (COZAAR) 50 MG tablet Take 1 tablet (50 mg total) by mouth daily. (Patient taking differently: Take 100 mg by mouth daily. ) 90 tablet 3  . montelukast (SINGULAIR) 10 MG tablet TAKE 1 TABLET BY MOUTH EVERY DAY 90 tablet 3  . nitroGLYCERIN (NITROSTAT) 0.4 MG SL tablet Place 1 tablet (0.4 mg total) under the tongue every 5 (five) minutes as needed for chest pain. 25 tablet 1  . rosuvastatin (CRESTOR) 20 MG tablet Take 1 tablet (20 mg total) by mouth daily. 90 tablet 3   No current facility-administered medications on file prior to visit.    Allergies  Allergen Reactions  . Benazepril Other (See Comments)    hyperkalemia  . Fish Allergy Itching  . Omega-3 Fatty Acids Hives and Itching  . Fish Oil Itching and Rash   Social History   Socioeconomic History  . Marital status: Married    Spouse name: Not on file  . Number of children: 3  . Years of education: Not on file  . Highest education  level: Not on file  Occupational History  . 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  Social Needs  . Financial resource strain: Not on file  . Food insecurity:    Worry: Not on file    Inability: Not on file  . Transportation needs:    Medical: Not on file    Non-medical: Not on file  Tobacco Use  . Smoking status: Former Smoker    Packs/day: 1.00    Years: 50.00    Pack years: 50.00    Types: Cigarettes    Last attempt to quit: 08/01/2017    Years since quitting: 0.2  . Smokeless tobacco: Never Used  Substance and Sexual Activity  . Alcohol use: Yes    Alcohol/week: 0.0 oz    Comment: occasionally  . Drug use: No  . Sexual activity: Yes  Lifestyle  . Physical activity:    Days per week: Not on file    Minutes per session: Not on file  . Stress: Not on file  Relationships  . Social connections:    Talks on phone: Not on file    Gets together: Not on file    Attends religious service: Not on file    Active member of club or organization: Not on file    Attends meetings of clubs or organizations: Not on file    Relationship status: Not on file  . Intimate partner violence:    Fear of current or ex partner: Not on file    Emotionally abused: Not on file    Physically abused: Not on file    Forced sexual activity: Not on file  Other Topics Concern  . Not on file  Social History Narrative  . Not on file      Review of Systems  All other systems reviewed and are negative.      Objective:   Physical Exam  Cardiovascular: Normal rate, regular rhythm and normal heart sounds.  Pulmonary/Chest: Effort normal and breath sounds normal. No respiratory distress. He has no wheezes. He has no rales.  Abdominal: Soft. Bowel sounds are normal.  Musculoskeletal: He exhibits no edema.  Vitals reviewed.         Assessment & Plan:  Chronic  systolic congestive heart failure ([place]) - Plan: BASIC METABOLIC PANEL WITH GFR  I  believe the initial benefit may have been the overlap of the losartan and the Entresto.  Therefore I believe if we increase the dose of Entresto to 49/51 1 tablet p.o. twice daily, hopefully the patient will see sustained benefit.  At that point I would also recommend 30 minutes a day 5 days a week of aerobic exercise to improve stamina and conditioning.  Will defer to the pulmonologist advice regarding trying a long-acting bronchodilator given his pulmonary function test findings.  Check BMP today to monitor potassium and renal function.  Hopefully the higher dose of Entresto will also help lower his blood pressure.

## 2017-11-06 ENCOUNTER — Encounter: Payer: Self-pay | Admitting: *Deleted

## 2017-11-06 ENCOUNTER — Ambulatory Visit (INDEPENDENT_AMBULATORY_CARE_PROVIDER_SITE_OTHER): Payer: Medicare Other | Admitting: Internal Medicine

## 2017-11-06 ENCOUNTER — Encounter: Payer: Self-pay | Admitting: Internal Medicine

## 2017-11-06 VITALS — BP 140/88 | HR 56 | Ht 72.0 in | Wt 224.0 lb

## 2017-11-06 DIAGNOSIS — R0600 Dyspnea, unspecified: Secondary | ICD-10-CM

## 2017-11-06 DIAGNOSIS — Z87891 Personal history of nicotine dependence: Secondary | ICD-10-CM

## 2017-11-06 DIAGNOSIS — R911 Solitary pulmonary nodule: Secondary | ICD-10-CM

## 2017-11-06 DIAGNOSIS — R053 Chronic cough: Secondary | ICD-10-CM

## 2017-11-06 DIAGNOSIS — R05 Cough: Secondary | ICD-10-CM | POA: Diagnosis not present

## 2017-11-06 LAB — NITRIC OXIDE: Nitric Oxide: 52

## 2017-11-06 MED ORDER — FLUTICASONE FUROATE 200 MCG/ACT IN AEPB: 1.0000 | INHALATION_SPRAY | Freq: Every day | RESPIRATORY_TRACT | 0 refills | Status: DC

## 2017-11-06 NOTE — Addendum Note (Signed)
Addended by: Len Blalock on: 11/06/2017 12:06 PM   Modules accepted: Orders

## 2017-11-06 NOTE — Progress Notes (Signed)
   Subjective:    Patient ID: John [name], male    DOB: [birth year], 67 y.o.   MRN: [id number]  HPI    Review of Systems  Constitutional: Negative for fever and unexpected weight change.  HENT: Negative for congestion, dental problem, ear pain, nosebleeds, postnasal drip, rhinorrhea, sinus pressure, sneezing, sore throat and trouble swallowing.   Eyes: Negative for redness and itching.  Respiratory: Positive for shortness of breath. Negative for cough, chest tightness and wheezing.   Cardiovascular: Negative for palpitations and leg swelling.  Gastrointestinal: Negative for nausea and vomiting.  Genitourinary: Negative for dysuria.  Musculoskeletal: Negative for joint swelling.  Skin: Negative for rash.  Neurological: Negative for headaches.  Hematological: Does not bruise/bleed easily.  Psychiatric/Behavioral: Negative for dysphoric mood. The patient is not nervous/anxious.        Objective:   Physical Exam        Assessment & Plan:

## 2017-11-06 NOTE — Progress Notes (Signed)
Subjective:     Patient ID: John [name], male   DOB: Feb 20, 1951, 67 y.o.   MRN: [id number] PCP [name], MD   HPI  IOV 11/06/2017  Chief Complaint  Patient presents with  . Consult    Referred by Dr. Cindi Carbon for dyspnea on exertion X1 year.     67 year old former smoker who worked in a [employer] as a Freight forwarder without any metal dust or organic dust exposure.  Only exposure being cigarette smoking with a 50 pack smoking history quit earlier in 2019 and ongoing amiodarone.  He is known to have chronic systolic heart failure with ejection fraction around 40-45% although November 2018 stress test this at reduced to 35%.  He is reporting insidious onset of worsening shortness of breath on exertion since summer 2018 and it is progressive.  One year ago he could do hike [place] halfway with his granddaughter but currently he says doing yard work and moving across rooms makes him dyspneic and he has to stop.  There is a chest tightness in the shape of an inverted Y from the neck to the center of the chest but he says this is not chest pain.  In November 2018 he did have cardiac stress test and there is no ongoing active ischemia.  In March 2019 he reported to any pain emergency department and I reviewed this chart where he had hypertension related pulmonary congestion on chest x-ray.  This was medically treated in the emergency department and discharged since then he is says this is improved but dyspnea on exertion continues to get worse.  There is no orthopnea [he does use chronic stable CPAP for many years] or proximal nocturnal dyspnea or worsening edema.  In fact he does not have much edema at all.  Walking desaturation test today was normal to exam he did get mildly short of breath but no chest pain  Of note symptoms are associated with some nonspecific cough but no wheezing   Recent lab review  Known chronic systolic heart failure but December 2017 in November 2018  echocardiogram showing ejection fraction 40-45% but a nuclear medicine cardiac stress test November 2018 showing reduced ejection fraction to 35%   Personal visualization of imaging March 2014 CT angiogram chest he seems to have a 5 mm nodule but otherwise clear lung field [the official radiology report does not comment about this nodule].  March 2019 chest x-ray shows cardiomegaly with congestive heart failure findings.  I personally visualized both these images   Recent labs include creatinine 1.24 mg percent with GFR 59 in May 2019 and a hemoglobin of 14.1 g% in March 2019  Simple office walk 185 feet x  3 laps goal with forehead probe 11/06/2017   O2 used Room air  Number laps completed All 3  Comments about pace No comment  Resting Pulse Ox/HR 98% and 50/min  Final Pulse Ox/HR 96% and 57/min  Desaturated </= 88% no  Desaturated <= 3% points no  Got Tachycardic >/= 90/min no  Symptoms at end of test Mild dyspnea  Miscellaneous comments No comment   Exam nitric oxide test today in the office: 52 ppb and elevated    has a past medical history of Coronary artery disease, Diabetes mellitus, GERD (gastroesophageal reflux disease), History of nuclear stress test (04/04/2011), Hyperlipidemia, Hypertension, Left foot drop, Myocardial infarction ([place]), Shortness of breath, and Sleep apnea.   reports that he quit smoking about 3 months ago. His smoking use  included cigarettes. He has a 50.00 pack-year smoking history. He has never used smokeless tobacco.  Past Surgical History:  Procedure Laterality Date  . BACK SURGERY  1985  . CARDIAC CATHETERIZATION  2010   6 stents total  . CARDIAC CATHETERIZATION  01/2000   percutaneous transluminal coronary balloon angioplasty of mid RCA stenotic lesion  . CARDIAC CATHETERIZATION  06/2006   no stenting; ischemic cardiomyopathy, EF 40-45%  . CARDIOVERSION N/A 07/28/2016   Procedure: CARDIOVERSION;  Surgeon: Troy Sine, MD;  Location: [facility];   Service: Cardiovascular;  Laterality: N/A;  . CORONARY ANGIOPLASTY  09/1998   mid-distal RCA balloon dilatation, 4.5 & 5.0 stents   . CORONARY ANGIOPLASTY WITH STENT PLACEMENT  03/1994   angioplasty & stenting (non-DES) of circumflex/prox ramus intermedius  . CORONARY ANGIOPLASTY WITH STENT PLACEMENT  10/1994   large iliac [id number] stent to RCA  . CORONARY ANGIOPLASTY WITH STENT PLACEMENT  12/2002   4.14mm stents x2 of RCA  . CORONARY ANGIOPLASTY WITH STENT PLACEMENT  01/2005   cutting balloon arthrectomy of distal RCA & Cypher DES 3.5x13; cutting balloon arthrectomy of mid RCA with Cypher DES 3.5x18  . CORONARY ANGIOPLASTY WITH STENT PLACEMENT  11/2008   stenting of mid RCA with 4.0x9mm driver, non-DES  . LEFT HEART CATHETERIZATION WITH CORONARY ANGIOGRAM N/A 02/27/2012   Procedure: LEFT HEART CATHETERIZATION WITH CORONARY ANGIOGRAM;  Surgeon: Lorretta Harp, MD;  Location: [facility] CATH LAB;  Service: Cardiovascular;  Laterality: N/A;  . TRANSTHORACIC ECHOCARDIOGRAM  07/29/2010   EF 50=55%, mod inf wall hypokinesis & mild post wall hypokinesis; LA mild-mod dilated; mild mitral annular calcif & mild MR; mild TR & elevated RV systolic pressure; AV mildly sclerotic; mild aortic root dilatation     Allergies  Allergen Reactions  . Benazepril Other (See Comments)    hyperkalemia  . Fish Allergy Itching  . Omega-3 Fatty Acids Hives and Itching  . Fish Oil Itching and Rash    Immunization History  Administered Date(s) Administered  . Influenza, High Dose Seasonal PF 03/15/2017  . Influenza, Seasonal, Injecte, Preservative Fre 04/07/2013  . Influenza,inj,Quad PF,6+ Mos 06/04/2014, 04/08/2015, 04/18/2016  . Pneumococcal Conjugate-13 01/02/2014  . Pneumococcal Polysaccharide-23 01/07/2016  . Tdap 12/29/2012    Family History  Problem Relation Age of Onset  . Heart attack Father      Current Outpatient Medications:  .  amiodarone (PACERONE) 200 MG tablet, Take 200 mg by mouth daily., Disp: ,  Rfl:  .  aspirin 81 MG tablet, Take 81 mg by mouth daily., Disp: , Rfl:  .  BYSTOLIC 5 MG tablet, TAKE 1 TABLET BY MOUTH EVERY DAY, Disp: 30 tablet, Rfl: 9 .  ELIQUIS 5 MG TABS tablet, TAKE 1 TABLET BY MOUTH TWICE A DAY, Disp: 180 tablet, Rfl: 1 .  glucose blood (ONE TOUCH ULTRA TEST) test strip, USE 1 STRIP 3 TIMES DAILY - USE AS INSTRUCTED DX: E11.9, Disp: 100 each, Rfl: 2 .  Insulin Detemir (LEVEMIR FLEXTOUCH) 100 UNIT/ML Pen, Inject 34 Units into the skin daily. ICD E11.65, Disp: 15 pen, Rfl: 3 .  Insulin Pen Needle (NOVOFINE) 32G X 6 MM MISC, 1 each by Other route daily., Disp: 100 each, Rfl: 3 .  montelukast (SINGULAIR) 10 MG tablet, TAKE 1 TABLET BY MOUTH EVERY DAY, Disp: 90 tablet, Rfl: 3 .  nitroGLYCERIN (NITROSTAT) 0.4 MG SL tablet, Place 1 tablet (0.4 mg total) under the tongue every 5 (five) minutes as needed for chest pain., Disp: 25 tablet, Rfl: 1 .  sacubitril-valsartan (ENTRESTO) 24-26 MG, Take 1 tablet by mouth 2 (two) times daily., Disp: , Rfl:  .  rosuvastatin (CRESTOR) 20 MG tablet, Take 1 tablet (20 mg total) by mouth daily., Disp: 90 tablet, Rfl: 3    Review of Systems     Objective:   Physical Exam  Constitutional: He is oriented to person, place, and time. He appears well-developed and well-nourished. No distress.  HENT:  Head: Normocephalic and atraumatic.  Right Ear: External ear normal.  Left Ear: External ear normal.  Mouth/Throat: Oropharynx is clear and moist. No oropharyngeal exudate.  Eyes: Pupils are equal, round, and reactive to light. Conjunctivae and EOM are normal. Right eye exhibits no discharge. Left eye exhibits no discharge. No scleral icterus.  Neck: Normal range of motion. Neck supple. No JVD present. No tracheal deviation present. No thyromegaly present.  Cardiovascular: Normal rate, regular rhythm and intact distal pulses. Exam reveals no gallop and no friction rub.  No murmur heard. Pulmonary/Chest: Effort normal and breath sounds normal. No  respiratory distress. He has no wheezes. He has no rales. He exhibits no tenderness.  Possible crack;es in base  Abdominal: Soft. Bowel sounds are normal. He exhibits no distension and no mass. There is no tenderness. There is no rebound and no guarding.  Musculoskeletal: Normal range of motion. He exhibits no edema or tenderness.  Lymphadenopathy:    He has no cervical adenopathy.  Neurological: He is alert and oriented to person, place, and time. He has normal reflexes. No cranial nerve deficit. Coordination normal.  Skin: Skin is warm and dry. No rash noted. He is not diaphoretic. No erythema. No pallor.  Psychiatric: He has a normal mood and affect. His behavior is normal. Judgment and thought content normal.  Nursing note and vitals reviewed.  Vitals:   11/06/17 1106  BP: 140/88  Pulse: (!) 56  SpO2: 98%  Weight: 224 lb (101.6 kg)  Height: 6' (1.829 m)    Estimated body mass index is 30.38 kg/m as calculated from the following:   Height as of this encounter: 6' (1.829 m).   Weight as of this encounter: 224 lb (101.6 kg).      Assessment:       ICD-10-CM   1. Dyspnea, unspecified type R06.00 Nitric oxide  2. Chronic cough R05   3. Stopped smoking with greater than 40 pack year history Z87.891   4. Incidental lung nodule, > 59mm and < 66mm R91.1        Plan:      Possible asthma based on nitric oxide testing  But need to rule out pulmonary lung tissue issues such as emphysema, fibrosis, and lung nodule given prior smoking history, amidoarone intake and also likely lung nodule in 2014 film (my opinion)  Plan - start Arnuity daily scheduled + albuterol as needed -  Possible asthma based on nitric oxide testing  But need to rule out pulmonary lung tissue issues such as emphysema, fibrosis, and lung nodule given prior smoking history, amidoarone intake and also likely lung nodule in 2014 film (my opinion)  Plan - start Arnuity daily scheduled + albuterol as needed  -  HRCT supine and prone - next few weeks - Pre-bd and post-bd spiro and dlco only. No lung volume - next few weeks  Followup -rreturn to see Dr Chase Caller or APP but after completing above - next few to several weeks only  - HRCT supine and prone - next few weeks - Pre-bd and post-bd spiro and  dlco only. No lung volume - next few weeks  Followup  0-rreturn to see Dr Chase Caller or APP but after completing above - next few to several weeks only    Dr. Brand Males, M.D., [facility].C.P Pulmonary and Critical Care Medicine Staff Physician, [place] Director - Interstitial Lung Disease  Program  Pulmonary [place] [facility] [place], [place], [address]  Pager: [phone], If no answer or between  15:00h - 7:00h: call [phone] Telephone: [phone]

## 2017-11-06 NOTE — Patient Instructions (Addendum)
ICD-10-CM   1. Dyspnea, unspecified type R06.00 Nitric oxide  2. Chronic cough R05   3. Stopped smoking with greater than 40 pack year history Z87.891   4. Incidental lung nodule, > 65mm and < 60mm R91.1    Possible asthma based on nitric oxide testing  But need to rule out pulmonary lung tissue issues such as emphysema, fibrosis, and lung nodule given prior smoking history, amidoarone intake and also likely lung nodule in 2014 film (my opinion)  Plan - do CBC with diff and IgE blood work - start Arnuity daily scheduled + albuterol as needed  - HRCT supine and prone - next few weeks - Pre-bd and post-bd spiro and dlco only. No lung volume - next few weeks  Followup  0-rreturn to see Dr Chase Caller or APP but after completing above - next few to several weeks only

## 2017-11-09 ENCOUNTER — Other Ambulatory Visit: Payer: Self-pay | Admitting: Family Medicine

## 2017-11-21 ENCOUNTER — Ambulatory Visit (INDEPENDENT_AMBULATORY_CARE_PROVIDER_SITE_OTHER): Payer: Medicare Other | Admitting: Family Medicine

## 2017-11-21 ENCOUNTER — Encounter: Payer: Self-pay | Admitting: Family Medicine

## 2017-11-21 VITALS — BP 132/80 | HR 50 | Temp 97.8°F | Resp 14 | Ht 72.0 in | Wt 228.4 lb

## 2017-11-21 DIAGNOSIS — R21 Rash and other nonspecific skin eruption: Secondary | ICD-10-CM

## 2017-11-21 DIAGNOSIS — L739 Follicular disorder, unspecified: Secondary | ICD-10-CM

## 2017-11-21 MED ORDER — CEPHALEXIN 500 MG PO CAPS: 500.0000 mg | ORAL_CAPSULE | Freq: Four times a day (QID) | ORAL | 0 refills | Status: AC

## 2017-11-21 MED ORDER — MUPIROCIN 2 % EX OINT: 1.0000 "application " | TOPICAL_OINTMENT | Freq: Two times a day (BID) | CUTANEOUS | 0 refills | Status: DC | PRN

## 2017-11-21 MED ORDER — KETOCONAZOLE 2 % EX SHAM: 1.0000 "application " | MEDICATED_SHAMPOO | CUTANEOUS | 0 refills | Status: DC

## 2017-11-21 NOTE — Progress Notes (Signed)
Patient ID: John [name], male    DOB: 03/13/1951, 67 y.o.   MRN: [id number]  PCP: [name], MD  Chief Complaint  Patient presents with  . bumps on back of head    noticed about a month ago     Subjective:   John [name] is a 67 y.o. male, presents to clinic with CC of 5-6 weeks of itchy painful bumps to his scalp where strap from CPAP machine goes.  Wife tells him there are often have white heads that she pops.  He has not used the strap or CPAP for 5 weeks and they continue to pop up, no improvement.  They are tender to the touch, mild irritation, constant, and itchy.  No treatments attempted prior to arrival.  No past similar issues with his CPAP machine.  Other rashes or itching and welts on his body.   Patient Active Problem List   Diagnosis Date Noted  . Acoustic neuroma ([place]) 09/11/2017  . Asymmetrical sensorineural hearing loss 09/11/2017  . Encounter for cardioversion procedure   . Anticoagulation adequate 06/06/2016  . Fatigue 10/27/2015  . Bradycardia 10/27/2015  . Hyperlipidemia LDL goal <70 05/20/2015  . OSA on CPAP 07/23/2013  . Excessive daytime sleepiness 07/23/2013  . Hypertension   . Hyperlipidemia   . Diabetes mellitus   . Chest pain 08/18/2012  . Tobacco abuse 08/18/2012  . Unstable angina, cath [facility] 02/27/12 02/26/2012  . CAD, multiple prior RCA PCI's. Last cath 2010, Myoview low risk Oct 2012 02/26/2012  . IDDM (insulin dependent diabetes mellitus) ([place]) 02/26/2012  . HTN (hypertension) 02/26/2012  . Dyslipidemia, low HDL. Intol to fish oil and Noacin 02/26/2012  . Sleep apnea, on C-pap 02/26/2012  . COPD (chronic obstructive pulmonary disease) ([place]) 02/26/2012  . Smoking, quit one week ago 02/26/2012  . GERD (gastroesophageal reflux disease) 02/26/2012     Prior to Admission medications   Medication Sig Start Date End Date Taking? Authorizing Provider  amiodarone (PACERONE) 200 MG tablet Take 200 mg by mouth daily.   Yes [provider]  aspirin 81 MG tablet Take 81 mg by mouth daily.   Yes [provider]  BYSTOLIC 5 MG tablet TAKE 1 TABLET BY MOUTH EVERY DAY 06/04/17  Yes Troy Sine, MD  ELIQUIS 5 MG TABS tablet TAKE 1 TABLET BY MOUTH TWICE A DAY 05/29/17  Yes Troy Sine, MD  Fluticasone Furoate (ARNUITY ELLIPTA) 200 MCG/ACT AEPB Inhale 1 puff into the lungs daily. 11/06/17  Yes Brand Males, MD  glucose blood (ONE TOUCH ULTRA TEST) test strip USE 1 STRIP 3 TIMES DAILY - USE AS INSTRUCTED DX: E11.9 11/09/17  Yes Pickard, [name], MD  Insulin Detemir (LEVEMIR FLEXTOUCH) 100 UNIT/ML Pen Inject 34 Units into the skin daily. ICD E11.65 07/16/17  Yes [name], MD  Insulin Pen Needle (NOVOFINE) 32G X 6 MM MISC 1 each by Other route daily. 02/20/17  Yes [name], MD  montelukast (SINGULAIR) 10 MG tablet TAKE 1 TABLET BY MOUTH EVERY DAY 08/06/17  Yes [name], MD  nitroGLYCERIN (NITROSTAT) 0.4 MG SL tablet Place 1 tablet (0.4 mg total) under the tongue every 5 (five) minutes as needed for chest pain. 09/13/17  Yes Troy Sine, MD  sacubitril-valsartan (ENTRESTO) 24-26 MG Take 1 tablet by mouth 2 (two) times daily.   Yes [provider]  rosuvastatin (CRESTOR) 20 MG tablet Take 1 tablet (20 mg total) by mouth daily. 07/20/16  09/11/17  Troy Sine, MD     Allergies  Allergen Reactions  . Benazepril Other (See Comments)    hyperkalemia  . Fish Allergy Itching  . Omega-3 Fatty Acids Hives and Itching  . Fish Oil Itching and Rash     Family History  Problem Relation Age of Onset  . Heart attack Father      Social History   Socioeconomic History  . Marital status: Married    Spouse name: Not on file  . Number of children: 3  . Years of education: Not on file  . Highest education level: Not on file  Occupational History  . 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 [place]  Social Needs  . Financial resource strain:  Not on file  . Food insecurity:    Worry: Not on file    Inability: Not on file  . Transportation needs:    Medical: Not on file    Non-medical: Not on file  Tobacco Use  . Smoking status: Former Smoker    Packs/day: 1.00    Years: 50.00    Pack years: 50.00    Types: Cigarettes    Last attempt to quit: 08/04/2017    Years since quitting: 0.2  . Smokeless tobacco: Never Used  Substance and Sexual Activity  . Alcohol use: Yes    Alcohol/week: 0.0 oz    Comment: occasionally  . Drug use: No  . Sexual activity: Yes  Lifestyle  . Physical activity:    Days per week: Not on file    Minutes per session: Not on file  . Stress: Not on file  Relationships  . Social connections:    Talks on phone: Not on file    Gets together: Not on file    Attends religious service: Not on file    Active member of club or organization: Not on file    Attends meetings of clubs or organizations: Not on file    Relationship status: Not on file  . Intimate partner violence:    Fear of current or ex partner: Not on file    Emotionally abused: Not on file    Physically abused: Not on file    Forced sexual activity: Not on file  Other Topics Concern  . Not on file  Social History Narrative  . Not on file     Review of Systems  Constitutional: Negative.  Negative for activity change, appetite change, chills, diaphoresis, fatigue and unexpected weight change.  Eyes: Negative.   Skin: Positive for rash. Negative for color change, pallor and wound.  All other systems reviewed and are negative.      Objective:    Vitals:   11/21/17 1059  BP: 132/80  Pulse: (!) 50  Resp: 14  Temp: 97.8 F (36.6 C)  TempSrc: Oral  SpO2: 97%  Weight: 228 lb 6.4 oz (103.6 kg)  Height: 6' (1.829 m)      Physical Exam  Constitutional: He appears well-developed.  HENT:  Head: Normocephalic and atraumatic.  Nose: Nose normal.  Eyes: Conjunctivae are normal. Right eye exhibits no discharge. Left eye  exhibits no discharge.  Neck: No tracheal deviation present.  Cardiovascular: Normal rate and regular rhythm.  Pulmonary/Chest: Effort normal. No stridor. No respiratory distress.  Musculoskeletal: Normal range of motion.  Neurological: He is alert. He exhibits normal muscle tone. Coordination normal.  Skin: Skin is warm and dry. Rash noted.  Linear  distribution of pustules and maculopapular erythematous rash along the back of his scalp.  There is some peeling and healing appearing papular areas that feels slightly indurated, no fluctuance, no edema but patient states they are tender to palpation.  Pustules are roughly 5 mm in diameter, without surrounding edema, erythema, induration, warmth or tenderness No scalp lymphadenopathy No alopecia associated with the rash  Psychiatric: He has a normal mood and affect. His behavior is normal.  Nursing note and vitals reviewed.         Assessment & Plan:      ICD-10-CM   1. Folliculitis M08.6 cephALEXin (KEFLEX) 500 MG capsule    mupirocin ointment (BACTROBAN) 2 %  2. Rash and nonspecific skin eruption R21 ketoconazole (NIZORAL) 2 % shampoo    Rash to back of head and hair along the area of distribution of his CPAP strap.  Differential includes folliculitis versus tinea capitis.    Start with ketoconazole shampoo and topical mupirocin, exfoliate scalp.   Patient to attempt to sanitize, bleach or replace his CPAP strap to decrease any microbial burden Printed prescription for Keflex, to hold, in case has any worsening areas of infection -the redness, swelling or tenderness  If not improving patient was instructed to return for recheck, discussed possible need for oral antifungal medication, but would likely need to recheck rash before prescribing, due to side effects.   Delsa Grana, PA-C 11/21/17 11:27 AM

## 2017-11-21 NOTE — Patient Instructions (Signed)
First try ketoconazole shampoo with gentle exfoliation to the area of your scalp that is affected.  And try applying mupirocin ointment twice a day.  If this does not improve your symptoms in 3 to 5 days I would start taking Keflex, and oral antibiotic.   Try to bleach or sanitize your CPAP strap before using again  Please return if not improving, or call us immediately if worsening or spreading   Folliculitis Folliculitis is inflammation of the hair follicles. Folliculitis most commonly occurs on the scalp, thighs, legs, back, and buttocks. However, it can occur anywhere on the body. What are the causes? This condition may be caused by:  A bacterial infection (common).  A fungal infection.  A viral infection.  Coming into contact with certain chemicals, especially oils and tars.  Shaving or waxing.  Applying greasy ointments or creams to your skin often.  Long-lasting folliculitis and folliculitis that keeps coming back can be caused by bacteria that live in the nostrils. What increases the risk? This condition is more likely to develop in people with:  A weakened immune system.  Diabetes.  Obesity.  What are the signs or symptoms? Symptoms of this condition include:  Redness.  Soreness.  Swelling.  Itching.  Small white or yellow, pus-filled, itchy spots (pustules) that appear over a reddened area. If there is an infection that goes deep into the follicle, these may develop into a boil (furuncle).  A group of closely packed boils (carbuncle). These tend to form in hairy, sweaty areas of the body.  How is this diagnosed? This condition is diagnosed with a skin exam. To find what is causing the condition, your health care provider may take a sample of one of the pustules or boils for testing. How is this treated? This condition may be treated by:  Applying warm compresses to the affected areas.  Taking an antibiotic medicine or applying an antibiotic  medicine to the skin.  Applying or bathing with an antiseptic solution.  Taking an over-the-counter medicine to help with itching.  Having a procedure to drain any pustules or boils. This may be done if a pustule or boil contains a lot of pus or fluid.  Laser hair removal. This may be done to treat long-lasting folliculitis.  Follow these instructions at home:  If directed, apply heat to the affected area as often as told by your health care provider. Use the heat source that your health care provider recommends, such as a moist heat pack or a heating pad. ? Place a towel between your skin and the heat source. ? Leave the heat on for 20-30 minutes. ? Remove the heat if your skin turns bright red. This is especially important if you are unable to feel pain, heat, or cold. You may have a greater risk of getting burned.  If you were prescribed an antibiotic medicine, use it as told by your health care provider. Do not stop using the antibiotic even if you start to feel better.  Take over-the-counter and prescription medicines only as told by your health care provider.  Do not shave irritated skin.  Keep all follow-up visits as told by your health care provider. This is important. Get help right away if:  You have more redness, swelling, or pain in the affected area.  Red streaks are spreading from the affected area.  You have a fever. This information is not intended to replace advice given to you by your health care provider. Make sure you  discuss any questions you have with your health care provider. Document Released: 08/14/2001 Document Revised: 12/24/2015 Document Reviewed: 03/26/2015 Elsevier Interactive Patient Education  2018 [employer].

## 2017-11-22 ENCOUNTER — Ambulatory Visit (HOSPITAL_COMMUNITY)
Admission: RE | Admit: 2017-11-22 | Discharge: 2017-11-22 | Disposition: A | Discharge status: Home / Self Care | Payer: Medicare Other | Source: Ambulatory Visit | Attending: Internal Medicine | Admitting: Internal Medicine

## 2017-11-22 DIAGNOSIS — I700 Atherosclerosis of aorta: Secondary | ICD-10-CM | POA: Insufficient documentation

## 2017-11-22 DIAGNOSIS — R0600 Dyspnea, unspecified: Secondary | ICD-10-CM

## 2017-11-22 DIAGNOSIS — I252 Old myocardial infarction: Secondary | ICD-10-CM | POA: Insufficient documentation

## 2017-11-22 DIAGNOSIS — R918 Other nonspecific abnormal finding of lung field: Secondary | ICD-10-CM | POA: Diagnosis not present

## 2017-11-22 DIAGNOSIS — I2510 Atherosclerotic heart disease of native coronary artery without angina pectoris: Secondary | ICD-10-CM | POA: Diagnosis not present

## 2017-11-22 DIAGNOSIS — J438 Other emphysema: Secondary | ICD-10-CM | POA: Diagnosis not present

## 2017-11-22 DIAGNOSIS — J849 Interstitial pulmonary disease, unspecified: Secondary | ICD-10-CM | POA: Diagnosis not present

## 2017-11-29 ENCOUNTER — Telehealth: Payer: Self-pay | Admitting: Family Medicine

## 2017-11-29 ENCOUNTER — Other Ambulatory Visit: Payer: Self-pay | Admitting: *Deleted

## 2017-11-29 ENCOUNTER — Ambulatory Visit (INDEPENDENT_AMBULATORY_CARE_PROVIDER_SITE_OTHER): Payer: Medicare Other | Admitting: Internal Medicine

## 2017-11-29 DIAGNOSIS — R0600 Dyspnea, unspecified: Secondary | ICD-10-CM | POA: Diagnosis not present

## 2017-11-29 LAB — PULMONARY FUNCTION TEST
DL/VA % PRED: 65 %
DL/VA: 3.11 ml/min/mmHg/L
DLCO unc % pred: 52 %
DLCO unc: 19.16 ml/min/mmHg
FEF 25-75 Post: 3.96 L/sec
FEF 25-75 Pre: 3.71 L/sec
FEF2575-%CHANGE-POST: 6 %
FEF2575-%PRED-PRE: 132 %
FEF2575-%Pred-Post: 141 %
FEV1-%Change-Post: 0 %
FEV1-%Pred-Post: 88 %
FEV1-%Pred-Pre: 88 %
FEV1-PRE: 3.25 L
FEV1-Post: 3.26 L
FEV1FVC-%CHANGE-POST: 1 %
FEV1FVC-%Pred-Pre: 113 %
FEV6-%Change-Post: -1 %
FEV6-%PRED-PRE: 82 %
FEV6-%Pred-Post: 80 %
FEV6-Post: 3.82 L
FEV6-Pre: 3.88 L
FEV6FVC-%Change-Post: 0 %
FEV6FVC-%PRED-POST: 105 %
FEV6FVC-%Pred-Pre: 105 %
FVC-%CHANGE-POST: -1 %
FVC-%Pred-Post: 76 %
FVC-%Pred-Pre: 77 %
FVC-Post: 3.82 L
FVC-Pre: 3.88 L
POST FEV6/FVC RATIO: 100 %
PRE FEV1/FVC RATIO: 84 %
Post FEV1/FVC ratio: 85 %
Pre FEV6/FVC Ratio: 100 %

## 2017-11-29 MED ORDER — SACUBITRIL-VALSARTAN 49-51 MG PO TABS: 1.0000 | ORAL_TABLET | Freq: Two times a day (BID) | ORAL | 5 refills | Status: DC

## 2017-11-29 MED ORDER — FLUTICASONE FUROATE 200 MCG/ACT IN AEPB: 1.0000 | INHALATION_SPRAY | Freq: Every day | RESPIRATORY_TRACT | 0 refills | Status: DC

## 2017-11-29 MED ORDER — ALBUTEROL SULFATE HFA 108 (90 BASE) MCG/ACT IN AERS: 2.0000 | INHALATION_SPRAY | Freq: Four times a day (QID) | RESPIRATORY_TRACT | 6 refills | Status: DC | PRN

## 2017-11-29 NOTE — Telephone Encounter (Signed)
Pt called [facility] requesting rx for Entresto to be sent to [facility] - per Dr. Alexis Goodell we were to increase his dose - will send over increased dose. Pt aware via vm

## 2017-11-29 NOTE — Telephone Encounter (Signed)
PA Case: [id number], Status: Approved, Coverage Starts on: 11/29/2017 12:00:00 AM, Coverage Ends on: 11/30/2019 12:00:00 AM. Questions? Contact [phone].  Pharm aware

## 2017-11-29 NOTE — Progress Notes (Signed)
Spirometry pre and post and Dlco done today. ?

## 2017-11-29 NOTE — Telephone Encounter (Signed)
PA Submitted through [email or web address] and received the following:  [employer] has not yet replied to your PA request. You may close this dialog, return to your dashboard, and perform other tasks. To check for an update later, open this request again from your dashboard. If [employer] has not replied to your request within 24-72 hours please contact [employer] at [phone].

## 2017-12-04 ENCOUNTER — Telehealth: Payer: Self-pay | Admitting: Internal Medicine

## 2017-12-04 NOTE — Telephone Encounter (Signed)
Seeing John [name] 12/05/17 - HA[place] ILD on ct. So, please give the [place] ILD question  Thanks   Dr. Brand Males, M.D., [facility].C.P Pulmonary and Critical Care Medicine Staff Physician, [place] Director - Interstitial Lung Disease  Program  Pulmonary [place] at [place], [place], [address]  Pager: [phone], If no answer or between  15:00h - 7:00h: call [phone] Telephone: [phone]   g

## 2017-12-05 ENCOUNTER — Encounter: Payer: Self-pay | Admitting: Internal Medicine

## 2017-12-05 ENCOUNTER — Telehealth: Payer: Self-pay | Admitting: Internal Medicine

## 2017-12-05 ENCOUNTER — Other Ambulatory Visit (INDEPENDENT_AMBULATORY_CARE_PROVIDER_SITE_OTHER): Payer: Medicare Other

## 2017-12-05 ENCOUNTER — Ambulatory Visit (INDEPENDENT_AMBULATORY_CARE_PROVIDER_SITE_OTHER): Payer: Medicare Other | Admitting: Internal Medicine

## 2017-12-05 VITALS — BP 122/70 | HR 54 | Ht 73.0 in | Wt 230.0 lb

## 2017-12-05 DIAGNOSIS — R05 Cough: Secondary | ICD-10-CM | POA: Diagnosis not present

## 2017-12-05 DIAGNOSIS — J849 Interstitial pulmonary disease, unspecified: Secondary | ICD-10-CM

## 2017-12-05 DIAGNOSIS — Z79899 Other long term (current) drug therapy: Secondary | ICD-10-CM | POA: Diagnosis not present

## 2017-12-05 DIAGNOSIS — R053 Chronic cough: Secondary | ICD-10-CM

## 2017-12-05 DIAGNOSIS — R0600 Dyspnea, unspecified: Secondary | ICD-10-CM

## 2017-12-05 DIAGNOSIS — Z87891 Personal history of nicotine dependence: Secondary | ICD-10-CM

## 2017-12-05 LAB — SEDIMENTATION RATE: Sed Rate: 9 mm/hr (ref 0–20)

## 2017-12-05 NOTE — Patient Instructions (Addendum)
Dyspnea, unspecified type ILD (interstitial lung disease) ([place]) aka Pulmonary Fibrosis (PF) Long term current use of amiodarone Stopped smoking with greater than 40 pack year history   - I am concerned you have Interstitial Lung Disease (ILD); you did not have this in 2014 CT  -  There are > 100 varieties of this - but prior smoking and amiodarone treatment could be primary or secondary drivers of this - To narrow down possibilities and assess severity please do the following tests  - Serum: ESR, ACE, ANA, DS-DNA, RF, anti-CCP, ssA, ssB, scl-70, ANCA screen, MPO, PR-3, Total CK,  RNP, Aldolase,  Hypersensitivity Pneumonitis Panel  - talk to Dr Claiborne Billings and defnitely come off amiodarone next few weeks  -do the [place] ILD questionnaire  - in 3-4 months do Pre-bd spiro and dlco only. No lung volume or bd response. No post-bd spiro   - return in 3-4 months but after PFT testing and definitely coming off amiodarone; hopefully with above measure it will improve/resolve   - if not will consider bronchoscopy with Lavage    Chronic cough  - definitely being driven by Delene Loll -9% incidence - talk to Dr Claiborne Billings and come off entresto - wil send message - there might be asthma as well - so for now contnine arnuity and singulair - might be driven by the ILD as well - at followup test feno   Followup 0- - return in 3-4 months but after PFT testing and definitely coming off amiodarone; hopefully with above measure it will improve/resolve   - at followup test FeNO   - if not will consider bronchoscopy with Lavage

## 2017-12-05 NOTE — Telephone Encounter (Signed)
Results were given by MR today at [place] and pt filled out ILD questionnaire.  Nothing further needed.

## 2017-12-05 NOTE — Telephone Encounter (Signed)
Hi John [name]   John [name] is going to see you soon  1. He has ILD on CT Chest. Ideally needs to come off amiodarone. Not sure if amio is primary driver but could be a secondary driver  2. HE says PCP [name], MD put him on Entresto 6 weeks ago an since then has cough . In past he has had ace inhibitor cough. Also, says is  $150/month co pay.   THanks  Dr. Brand Males, M.D., [facility].C.P Pulmonary and Critical Care Medicine Staff Physician, [place] Director - Interstitial Lung Disease  Program  Pulmonary [place] at [place], [place], [address]  Pager: [phone], If no answer or between  15:00h - 7:00h: call [phone] Telephone: [phone]

## 2017-12-05 NOTE — Progress Notes (Addendum)
Subjective:     Patient ID: John [name], male   DOB: [birth year], 67 y.o.   MRN: [id number]  HPI  PCP [name], MD   HPI  IOV 11/06/2017  Chief Complaint  Patient presents with  . Consult    Referred by Dr. Cindi Carbon for dyspnea on exertion X1 year.     67 year old former smoker who worked in a [employer] as a Freight forwarder without any metal dust or organic dust exposure.  Only exposure being cigarette smoking with a 50 pack smoking history quit earlier in 2019 and ongoing amiodarone.  He is known to have chronic systolic heart failure with ejection fraction around 40-45% although November 2018 stress test this at reduced to 35%.  He is reporting insidious onset of worsening shortness of breath on exertion since summer 2018 and it is progressive.  One year ago he could do [place] halfway with his granddaughter but currently he says doing yard work and moving across rooms makes him dyspneic and he has to stop.  There is a chest tightness in the shape of an inverted Y from the neck to the center of the chest but he says this is not chest pain.  In November 2018 he did have cardiac stress test and there is no ongoing active ischemia.  In March 2019 he reported to any pain emergency department and I reviewed this chart where he had hypertension related pulmonary congestion on chest x-ray.  This was medically treated in the emergency department and discharged since then he is says this is improved but dyspnea on exertion continues to get worse.  There is no orthopnea [he does use chronic stable CPAP for many years] or proximal nocturnal dyspnea or worsening edema.  In fact he does not have much edema at all.  Walking desaturation test today was normal to exam he did get mildly short of breath but no chest pain  Of note symptoms are associated with some nonspecific cough but no wheezing   Recent lab review  Known chronic systolic heart failure but December 2017 in November 2018  echocardiogram showing ejection fraction 40-45% but a nuclear medicine cardiac stress test November 2018 showing reduced ejection fraction to 35%   Personal visualization of imaging March 2014 CT angiogram chest he seems to have a 5 mm nodule but otherwise clear lung field [the official radiology report does not comment about this nodule].  March 2019 chest x-ray shows cardiomegaly with congestive heart failure findings.  I personally visualized both these images   Recent labs include creatinine 1.24 mg percent with GFR 59 in May 2019 and a hemoglobin of 14.1 g% in March 2019 Exam nitric oxide test today in the office: 52 ppb and elevated  OV 12/05/2017  Chief Complaint  Patient presents with  . Follow-up    HRCT performed 6/6 and PFT performed 6/13. Pt states he is about the same as he was at last visit. Pt states he still has some problems becoming SOB with exertion and states he will be seeing his cardiologist soon to see if they can find out the problem. Pt denies any coughing but states he has to clear his throat a lot and at times he has problem talking and becomes hoarse.    Follow-up progressive shortness of breath workup in the setting of chronic systolic heart failure that is deemed stable, previous heavy smoking history and amiodarone exposure  In this visit he reports that his shortness of breath  is stable but in the interim has developed a cough. He tells me that the cough started 6 weeks ago just prior last visit which was 4 weeks ago. He says this coincides withhis primary care physician putting him on an  Entreso for his chronic systolic heart failure. In the past she's had ACE inhibitor cough. At last visit his exhaled nitric oxide was elevated greater than 50 ppb. His cough has continued despite Singulair and inhaled steroid. He clearly believes that the heart failure medication is causing his cough. He wants to come off it. In terms of his shortness of breath workup for  interstitial lung disease he had pulmonary function test that shows restriction with low DLCO. The concomitant high-resolution CT scan of the chest-show evidence of ILD. I personally visualized the findings and confirmed that it is indeed present and it is new since 2014.. I agree with the thoracic radiologist findings of this is indeterminate but having said that it has a bibasal subpleural reticular pattern. This is not much of groundglass opacities. This brings the probability that this is UIP less than 50%. He denies any autoimmune findings.  [facility] -  specific interstitial lung disease questionnaire  SymptomS:reports insidious onset of shortness of breath for the last 1 year. It is progressive since it started. He rates it as a level IV while eating brushing the teeth or shaving and a level III while showering or dressing and the level for 4 dishes a laundry and a level III for walking on level at his own pace and shopping and the level fall while watering the lawn. He has a cough for the last 6 weeks it is dry. This no associated wheezing. There is associated clearing of throat  Past medical history: Positive for heart failureand history of asthma and negative autoimmune or collagen vascular disease. Positive for sleep apnea for which he is on CPAP. Positive for diabetes  Review of systems: Positive for arthralgia and fatigue but negative for any sicca syndromes Raynaud  Family history of pulmonary issues: Negative for pulmonary fibrosis cystic fibrosis, COPD or fever  MRSA exposure history: Previous smoker. Negative for cocaine or IV drug abuse. [place].  Occupational history: He lives in a condition space but otherwise extensive occupational history is negative  Medication history positive for amiodarone but otherwise extensive medication history is negative  Simple office walk 185 feet x  3 laps goal with forehead  probe 11/06/2017   O2 used Room air  Number laps completed All 3  Comments about pace No comment  Resting Pulse Ox/HR 98% and 50/min  Final Pulse Ox/HR 96% and 57/min  Desaturated </= 88% no  Desaturated <= 3% points no  Got Tachycardic >/= 90/min no  Symptoms at end of test Mild dyspnea  Miscellaneous comments No comment     Results for GARL, SPEIGNER (MRN [id number]) as of 12/05/2017 10:21  Ref. Range 11/29/2017 10:46  FVC-Pre Latest Units: L 3.88  FVC-%Pred-Pre Latest Units: % 77  Results for MYLON, MABEY (MRN [id number]) as of 12/05/2017 10:21  Ref. Range 11/29/2017 10:46  DLCO unc Latest Units: ml/min/mmHg 19.16  DLCO unc % pred Latest Units: % 52       has a past medical history of Coronary artery disease, Diabetes mellitus, GERD (gastroesophageal reflux disease), History of nuclear stress test (04/04/2011), Hyperlipidemia, Hypertension, Left foot drop, Myocardial infarction [facility]), Shortness of breath, and Sleep apnea.  reports that he quit smoking about 4 months ago. His smoking use included cigarettes. He has a 50.00 pack-year smoking history. He has never used smokeless tobacco.  Past Surgical History:  Procedure Laterality Date  . BACK SURGERY  1985  . CARDIAC CATHETERIZATION  2010   6 stents total  . CARDIAC CATHETERIZATION  01/2000   percutaneous transluminal coronary balloon angioplasty of mid RCA stenotic lesion  . CARDIAC CATHETERIZATION  06/2006   no stenting; ischemic cardiomyopathy, EF 40-45%  . CARDIOVERSION N/A 07/28/2016   Procedure: CARDIOVERSION;  Surgeon: Troy Sine, MD;  Location: [facility];  Service: Cardiovascular;  Laterality: N/A;  . CORONARY ANGIOPLASTY  09/1998   mid-distal RCA balloon dilatation, 4.5 & 5.0 stents   . CORONARY ANGIOPLASTY WITH STENT PLACEMENT  03/1994   angioplasty & stenting (non-DES) of circumflex/prox ramus intermedius  . CORONARY ANGIOPLASTY WITH STENT PLACEMENT  10/1994   large iliac [id number] stent to RCA  .  CORONARY ANGIOPLASTY WITH STENT PLACEMENT  12/2002   4.28m stents x2 of RCA  . CORONARY ANGIOPLASTY WITH STENT PLACEMENT  01/2005   cutting balloon arthrectomy of distal RCA & Cypher DES 3.5x13; cutting balloon arthrectomy of mid RCA with Cypher DES 3.5x18  . CORONARY ANGIOPLASTY WITH STENT PLACEMENT  11/2008   stenting of mid RCA with 4.0x166mdriver, non-DES  . LEFT HEART CATHETERIZATION WITH CORONARY ANGIOGRAM N/A 02/27/2012   Procedure: LEFT HEART CATHETERIZATION WITH CORONARY ANGIOGRAM;  Surgeon: JoLorretta HarpMD;  [facility];  Service: Cardiovascular;  Laterality: N/A;  . TRANSTHORACIC ECHOCARDIOGRAM  07/29/2010   EF 50=55%, mod inf wall hypokinesis & mild post wall hypokinesis; LA mild-mod dilated; mild mitral annular calcif & mild MR; mild TR & elevated RV systolic pressure; AV mildly sclerotic; mild aortic root dilatation     Allergies  Allergen Reactions  . Benazepril Other (See Comments)    hyperkalemia  . Fish Allergy Itching  . Omega-3 Fatty Acids Hives and Itching  . Fish Oil Itching and Rash    Immunization History  Administered Date(s) Administered  . Influenza, High Dose Seasonal PF 03/15/2017  . Influenza, Seasonal, Injecte, Preservative Fre 04/07/2013  . Influenza,inj,Quad PF,6+ Mos 06/04/2014, 04/08/2015, 04/18/2016  . Pneumococcal Conjugate-13 01/02/2014  . Pneumococcal Polysaccharide-23 01/07/2016  . Tdap 12/29/2012    Family History  Problem Relation Age of Onset  . Heart attack Father      Current Outpatient Medications:  .  albuterol (PROVENTIL HFA;VENTOLIN HFA) 108 (90 Base) MCG/ACT inhaler, Inhale 2 puffs into the lungs every 6 (six) hours as needed for wheezing or shortness of breath., Disp: 1 Inhaler, Rfl: 6 .  amiodarone (PACERONE) 200 MG tablet, Take 200 mg by mouth daily., Disp: , Rfl:  .  aspirin 81 MG tablet, Take 81 mg by mouth daily., Disp: , Rfl:  .  BYSTOLIC 5 MG tablet, TAKE 1 TABLET BY MOUTH EVERY DAY, Disp: 30 tablet, Rfl:  9 .  ELIQUIS 5 MG TABS tablet, TAKE 1 TABLET BY MOUTH TWICE A DAY, Disp: 180 tablet, Rfl: 1 .  Fluticasone Furoate (ARNUITY ELLIPTA) 200 MCG/ACT AEPB, Inhale 1 puff into the lungs daily., Disp: 30 each, Rfl: 0 .  glucose blood (ONE TOUCH ULTRA TEST) test strip, USE 1 STRIP 3 TIMES DAILY - USE AS INSTRUCTED DX: E11.9, Disp: 100 each, Rfl: 2 .  Insulin Detemir (LEVEMIR FLEXTOUCH) 100 UNIT/ML Pen, Inject 34 Units into the skin daily. ICD E11.65, Disp: 15 pen, Rfl: 3 .  Insulin Pen Needle (  NOVOFINE) 32G X 6 MM MISC, 1 each by Other route daily., Disp: 100 each, Rfl: 3 .  ketoconazole (NIZORAL) 2 % shampoo, Apply 1 application topically 2 (two) times a week., Disp: 120 mL, Rfl: 0 .  montelukast (SINGULAIR) 10 MG tablet, TAKE 1 TABLET BY MOUTH EVERY DAY, Disp: 90 tablet, Rfl: 3 .  mupirocin ointment (BACTROBAN) 2 %, Apply 1 application topically 2 (two) times daily as needed., Disp: 22 g, Rfl: 0 .  rosuvastatin (CRESTOR) 20 MG tablet, Take 20 mg by mouth daily., Disp: , Rfl:  .  sacubitril-valsartan (ENTRESTO) 49-51 MG, Take 1 tablet by mouth 2 (two) times daily., Disp: 60 tablet, Rfl: 5 .  nitroGLYCERIN (NITROSTAT) 0.4 MG SL tablet, Place 1 tablet (0.4 mg total) under the tongue every 5 (five) minutes as needed for chest pain. (Patient not taking: Reported on 12/05/2017), Disp: 25 tablet, Rfl: 1 .  rosuvastatin (CRESTOR) 20 MG tablet, Take 1 tablet (20 mg total) by mouth daily., Disp: 90 tablet, Rfl: 3   Review of Systems     Objective:   Physical Exam Vitals:   12/05/17 0952  BP: 122/70  Pulse: (!) 54  SpO2: 96%  Weight: 230 lb (104.3 kg)  Height: _0  (1.854 m)    Discussion only but brief reepeat physical -no definite crackles and +/- for clubbing    Assessment:       ICD-10-CM   1. Dyspnea, unspecified type R06.00   2. ILD (interstitial lung disease) ([place]) J84.9   3. Long term current use of amiodarone Z79.899   4. Stopped smoking with greater than 40 pack year history Z87.891    5. Chronic cough R05        Plan:     Dyspnea, unspecified type ILD (interstitial lung disease) ([place]) aka Pulmonary Fibrosis (PF) Long term current use of amiodarone Stopped smoking with greater than 40 pack year history   - I am concerned you have Interstitial Lung Disease (ILD); you did not have this in 2014 CT  -  There are > 100 varieties of this - but prior smoking and amiodarone treatment could be primary or secondary drivers of this - To narrow down possibilities and assess severity please do the following tests  - Serum: ESR, ACE, ANA, DS-DNA, RF, anti-CCP, ssA, ssB, scl-70, ANCA screen, MPO, PR-3, Total CK,  RNP, Aldolase,  Hypersensitivity Pneumonitis Panel  - talk to Dr Claiborne Billings and defnitely come off amiodarone next few weeks  -do the [place] ILD questionnaire  - in 3-4 months do Pre-bd spiro and dlco only. No lung volume or bd response. No post-bd spiro   - return in 3-4 months but after PFT testing and definitely coming off amiodarone; hopefully with above measure it will improve/resolve   - if not will consider bronchoscopy with Lavage    Chronic cough  - definitely being driven by Delene Loll -9% incidence - talk to Dr Claiborne Billings and come off entresto - wil send message - there might be asthma as well - so for now contnine arnuity and singulair - might be driven by the ILD as well - at followup test feno   Followup 0- - return in 3-4 months but after PFT testing and definitely coming off amiodarone; hopefully with above measure it will improve/resolve   - at followup test FeNO   - if not will consider bronchoscopy with Lavage     > 50% of this > 25 min visit spent in face to face counseling or  coordination of care    Dr. Brand Males, M.D., [facility].C.P Pulmonary and Critical Care Medicine Staff Physician, [place] Director - Interstitial Lung Disease  Program  Pulmonary [place] at [place], [place],  [address]  Pager: [phone], If no answer or between  15:00h - 7:00h: call [phone] Telephone: [phone]

## 2017-12-06 LAB — RNP ANTIBODIES

## 2017-12-07 ENCOUNTER — Telehealth: Payer: Self-pay | Admitting: Internal Medicine

## 2017-12-07 NOTE — Telephone Encounter (Signed)
Thanks for letting me know.  I will see the patient next Wednesday, December 12, 2017.

## 2017-12-07 NOTE — Telephone Encounter (Signed)
Received a phone call for call report from [facility]. The lab CKMB came back high 6.3.  [name] please advise thank you.

## 2017-12-09 NOTE — Telephone Encounter (Signed)
Thanks also his ckMB came back some what high- ? Statin effect. He has slight high ANAand DSDNA but I Think this falsoe positive.  Thanks  Dr. Brand Males, M.D., [facility].C.P Pulmonary and Critical Care Medicine Staff Physician, [place] Director - Interstitial Lung Disease  Program  Pulmonary [place] at [place], [place], [address]  Pager: [phone], If no answer or between  15:00h - 7:00h: call [phone] Telephone: [phone]

## 2017-12-09 NOTE — Telephone Encounter (Signed)
Let patient know that   Autoimmune and vasculitis panel negative except slight elevation in ANA and DS-DNA which when super high is reflective of sLE but in this case probbly false positive  CKMB high - ? Statin effect and I have sent message to Dr Claiborne Billings his cardiologist to address   He should follow plan for recent office visit and when IS ee him back will regroup

## 2017-12-10 ENCOUNTER — Telehealth: Payer: Self-pay | Admitting: Internal Medicine

## 2017-12-10 NOTE — Telephone Encounter (Signed)
Called and spoke with patient regarding results.  Informed the patient of results and recommendations today. Pt verbalized understanding and denied any questions or concerns at this time.  Pt advised that he has appt with Dr. Claiborne Billings MD-cardiologist 12/12/17 Scheduled appt with MR 01/23/2018 at 9:45am for ROV. Nothing further needed.

## 2017-12-10 NOTE — Telephone Encounter (Signed)
See other note

## 2017-12-10 NOTE — Telephone Encounter (Signed)
Attempted to call patient today regarding results. I did not receive an answer at time of call. I have left a voicemail message for pt to return call. X1  Routing message to Raquel Sarna to follow up with pt regarding results.

## 2017-12-11 LAB — ANA: Anti Nuclear Antibody(ANA): POSITIVE — AB

## 2017-12-11 LAB — ALDOLASE: Aldolase: 7.1 U/L (ref <=8.1)

## 2017-12-11 LAB — CK TOTAL AND CKMB (NOT AT ARMC)
CK, MB: 6.3 ng/mL — ABNORMAL HIGH (ref 0–5.0)
Relative Index: 7.9 — ABNORMAL HIGH (ref 0–4.0)
Total CK: 80 U/L (ref 44–196)

## 2017-12-11 LAB — HYPERSENSITIVITY PNUEMONITIS PROFILE
ASPERGILLUS FUMIGATUS: NEGATIVE
Faenia retivirgula: NEGATIVE
Pigeon Serum: NEGATIVE
S. VIRIDIS: NEGATIVE
T. CANDIDUS: NEGATIVE
T. VULGARIS: NEGATIVE

## 2017-12-11 LAB — ANCA SCREEN W REFLEX TITER: ANCA SCREEN: NEGATIVE

## 2017-12-11 LAB — ANTI-SCLERODERMA ANTIBODY: Scleroderma (Scl-70) (ENA) Antibody, IgG: <1 AI

## 2017-12-11 LAB — ANTI-NUCLEAR AB-TITER (ANA TITER): ANA Titer 1: 1:80 {titer} — ABNORMAL HIGH

## 2017-12-11 LAB — MPO/PR-3 (ANCA) ANTIBODIES: Myeloperoxidase Abs: <1 AI

## 2017-12-11 LAB — CYCLIC CITRUL PEPTIDE ANTIBODY, IGG

## 2017-12-11 LAB — RHEUMATOID FACTOR

## 2017-12-11 LAB — SJOGREN'S SYNDROME ANTIBODS(SSA + SSB)
SSA (RO) (ENA) ANTIBODY, IGG: NEGATIVE AI
SSB (La) (ENA) Antibody, IgG: <1 AI

## 2017-12-11 LAB — ANGIOTENSIN CONVERTING ENZYME: ANGIOTENSIN-CONVERTING ENZYME: 45 U/L (ref 9–67)

## 2017-12-11 LAB — ANTI-DNA ANTIBODY, DOUBLE-STRANDED: ds DNA Ab: 14 IU/mL — ABNORMAL HIGH

## 2017-12-12 ENCOUNTER — Ambulatory Visit (INDEPENDENT_AMBULATORY_CARE_PROVIDER_SITE_OTHER): Payer: Medicare Other | Admitting: Cardiovascular Disease

## 2017-12-12 ENCOUNTER — Telehealth: Payer: Self-pay | Admitting: Internal Medicine

## 2017-12-12 ENCOUNTER — Encounter: Payer: Self-pay | Admitting: Cardiovascular Disease

## 2017-12-12 ENCOUNTER — Telehealth: Payer: Self-pay | Admitting: *Deleted

## 2017-12-12 VITALS — BP 152/80 | HR 48 | Ht 73.0 in | Wt 227.4 lb

## 2017-12-12 DIAGNOSIS — E785 Hyperlipidemia, unspecified: Secondary | ICD-10-CM | POA: Diagnosis not present

## 2017-12-12 DIAGNOSIS — G4733 Obstructive sleep apnea (adult) (pediatric): Secondary | ICD-10-CM

## 2017-12-12 DIAGNOSIS — I2510 Atherosclerotic heart disease of native coronary artery without angina pectoris: Secondary | ICD-10-CM | POA: Diagnosis not present

## 2017-12-12 DIAGNOSIS — I10 Essential (primary) hypertension: Secondary | ICD-10-CM | POA: Diagnosis not present

## 2017-12-12 DIAGNOSIS — J849 Interstitial pulmonary disease, unspecified: Secondary | ICD-10-CM | POA: Diagnosis not present

## 2017-12-12 DIAGNOSIS — Z9989 Dependence on other enabling machines and devices: Secondary | ICD-10-CM | POA: Diagnosis not present

## 2017-12-12 DIAGNOSIS — I483 Typical atrial flutter: Secondary | ICD-10-CM | POA: Diagnosis not present

## 2017-12-12 MED ORDER — FLUTICASONE FUROATE 200 MCG/ACT IN AEPB: 1.0000 | INHALATION_SPRAY | Freq: Every day | RESPIRATORY_TRACT | 5 refills | Status: DC

## 2017-12-12 NOTE — Patient Instructions (Addendum)
Medication Instructions:  STOP amiodarone  INCREASE Entresto 49/51 mg two times daily  If resting pulse is greater than 60, increase Bystolic to 7.5 mg daily  Follow-Up: 8/29 at 1:20 pm with Dr. Claiborne Billings  Any Other Special Instructions Will Be Listed Below (If Applicable).     If you need a refill on your cardiac medications before your next appointment, please call your pharmacy.

## 2017-12-12 NOTE — Telephone Encounter (Signed)
Left message to call back  

## 2017-12-12 NOTE — Progress Notes (Signed)
Patient ID: John [name], male   DOB: Aug 31, 1950, 67 y.o.   MRN: [id number]     HPI:  John [name] is a 67 y.o. male who is a former patient of Dr. Rollene Fare. He presents for a 7 month cardiology followup evaluation.   John [name] has known CAD and underwent multiple percutaneous cardiac interventions with stenting to circumflex and RCA. His last cardiac catheterization was done by [name] in September 2013 showed his RCA stented patent but he had 40% narrowing in the very distal aspect. He has a history of hypertension, as well as long-standing tobacco use with mild polycythemia. I had seen him in a sleep clinic after he obtained a new CPAP machine.  He has severe sleep apnea with an AHI on his initial diagnostic study of 44 per hour overall and 72.6 per hour with REM sleep.  A download last year  showed excellent benefit from CPAP with an AHI of 3.1.  His initial stent to his RCA was in 1996 was a Cypher PS [id number] sent. In 1999 he underwent non-DES stenting to circumflex after he presented with a posterior MI. He had additional 4-5 stents placed in his RCA.  He has a history of prior back surgery and has had a left foot drop.  He has significant obstructive sleep apnea and continues to utilize CPAP therapy.  When I saw him in the past despite CPAP therapy he complained of fatigue and residual daytime sleepiness and I suggested the possibility of Nuvigil or Provigil if necessary.    John [name] he was seen  by [name] complaints of  atypical chest pain.  There was some concern that this may be reflux and pantoprazole was discontinued and replaced with dexilant. However, it was felt that the patient may require another stress test since his last evaluation was in 2012.  He also had had recent issues with some weight gain and shortness of breath and his intermittent Lasix dose changed to 40 mg daily.  On 05/06/2015 he underwent a nuclear stress test which was interpreted as a high risk study  due to an ejection fraction of 31%.  There was a large fixed inferior and inferoseptal wall defect suggestive of scar and evidence for inferoseptal akinesis.  There was no ischemia.   On his prior nuclear study the EF in October 2012 was 38%.  An echo Doppler study done the same day however, showed discordant data and his ejection fraction was 50-55%.  Hypokinesis of the inferior inferolateral wall was noted.  There was grade 1 diastolic dysfunction.  There was mild aortic root dilatation at 41 mm.  His left atrium was severely dilated.  His right atrium was severely dilated.  There was trivial TR.  Of note, his last echo Doppler study had shown an EF of 45-50%.  When I  saw him in November 2017  he stated that he has been using CPAP.  However, for the past month he has noticed significant increased fatigue.  He was also found to have elevated sugars and his insulin was adjusted.  He admits to development of some trace ankle swelling, left greater than right.  He denied any episodes of chest pain.  He was unaware of spells of tachycardia.  He denied presyncope or syncope.  During that evaluation, he was found to be in atrial flutter with variable block at 74 bpm of questionable duration.  At that time, I recommended that he discontinue Plavix and started him on eliquis  5 mg and further titrated his Bystolic to 10 mg.  His [id number] is at least 4 and with his CAD he was advised to continue aspirin 81 mg.  He underwent an echo Doppler study on 06/02/2016 which showed an EF of 40-45% with moderate diffuse hypokinesis.  While they are, mild aortic sclerosis and increased atrial septal thickness consistent with lipomatous hypertrophy.  I obtained a download of his CPAP unit from 02/09/2016 through 05/08/2016 and he was continuing to meet Medicare compliance with 91% of days of usage and 79% with usage greater than 4 hours.  He was averaging 5 hours and 55 minutes per night.  AHI was 3.7.  However, he had a very  large leak.  I have recommended that he get a new mask from his DME company.    He underwent successful cardioversion for atrial flutter.  He is unaware of any recurrent arrhythmia.  He continues to be on amiodarone, and Bystolic.  He is on eliquis for anticoagulation.  He has had difficulty with pain in the center of his back.  He has seen [name] and had undergone an MRI and was told of having disc disease.  He has noticed some shortness of breath.  He had undergone PFTs by [name] and was not found to have significant COPD or restrictive lung disease. [name]  He continues to be on amiodarone.  He has been on [id number] mg  And bystolic for hypertension and rosuvastatin 20 mg for hyperlipidemia.  Unfortunately he is still smoking at least a half a pack per day and is smoked for over 55 years.  Continues to use CPAP with 100% compliance.  He is diabetic on insulin.  He is diabetic on insulin.  He is retired as a Chartered certified accountant for [employer]. He had experienced some back discomfort.  He denies any classic exertional chest tightness.    An echo Doppler study on 04/26/2017 which showed an EF of 40-45% with grade 1 diastolic dysfunction.  There was borderline aortic root dilatation.  PA pressure was 31 mm.  He had mild LA and mild to moderate RA .  A follow-up nuclear perfusion study on 04/27/2017 continued to be low risk and showed findings consistent with prior inferolateral myocardial infarction.  The EF on the nuclear study was less than the echo at 34%.   Since I saw him in December 2018, had issues with shortness of breath and has been under evaluation by [name].  Fortunately, he finally quit tobacco in February 2019.  He is felt to have interstitial lung disease. Pulmonary function studies have shown restriction with low DLCO.  He felt possibly also to have asthma based on nitric oxide testing.  With his lung disease it has been recommended that amiodarone be considered for discontinuance.   He has had issues with cough.  His ACE inhibitor was ultimately changed by [name] and he was started on low-dose Entresto at 24/26 mm twice a day in light of his reduce LV function.  He is concerned about the price of the medication.  He denies any chest pain.  He is unaware of cardiac arrhythmia.  He denies presyncope or syncope.  He presents for reevaluation.  Past Medical History:  Diagnosis Date  . Coronary artery disease   . Diabetes mellitus   . GERD (gastroesophageal reflux disease)   . History of nuclear stress test 04/04/2011   lexiscan; mod-large in size fixed inferolateral defect (scar); non-diagnostic for ischemia; low risk  scan   . Hyperlipidemia   . Hypertension   . Left foot drop    r/t past disk srugery - uses Kevlar brace  . Myocardial infarction ([place])    posterior MI  . Shortness of breath   . Sleep apnea    on CPAP; 04/28/2007 split-night - AHI during total sleep 44.43/hr and REM 72.56/hr    Past Surgical History:  Procedure Laterality Date  . BACK SURGERY  1985  . CARDIAC CATHETERIZATION  2010   6 stents total  . CARDIAC CATHETERIZATION  01/2000   percutaneous transluminal coronary balloon angioplasty of mid RCA stenotic lesion  . CARDIAC CATHETERIZATION  06/2006   no stenting; ischemic cardiomyopathy, EF 40-45%  . CARDIOVERSION N/A 07/28/2016   Procedure: CARDIOVERSION;  Surgeon: Troy Sine, MD;  Location: [facility];  Service: Cardiovascular;  Laterality: N/A;  . CORONARY ANGIOPLASTY  09/1998   mid-distal RCA balloon dilatation, 4.5 & 5.0 stents   . CORONARY ANGIOPLASTY WITH STENT PLACEMENT  03/1994   angioplasty & stenting (non-DES) of circumflex/prox ramus intermedius  . CORONARY ANGIOPLASTY WITH STENT PLACEMENT  10/1994   large iliac [id number] stent to RCA  . CORONARY ANGIOPLASTY WITH STENT PLACEMENT  12/2002   4.70m stents x2 of RCA  . CORONARY ANGIOPLASTY WITH STENT PLACEMENT  01/2005   cutting balloon arthrectomy of distal RCA & Cypher DES 3.5x13;  cutting balloon arthrectomy of mid RCA with Cypher DES 3.5x18  . CORONARY ANGIOPLASTY WITH STENT PLACEMENT  11/2008   stenting of mid RCA with 4.0x173mdriver, non-DES  . LEFT HEART CATHETERIZATION WITH CORONARY ANGIOGRAM N/A 02/27/2012   Procedure: LEFT HEART CATHETERIZATION WITH CORONARY ANGIOGRAM;  Surgeon: JoLorretta HarpMD;  [facility];  Service: Cardiovascular;  Laterality: N/A;  . TRANSTHORACIC ECHOCARDIOGRAM  07/29/2010   EF 50=55%, mod inf wall hypokinesis & mild post wall hypokinesis; LA mild-mod dilated; mild mitral annular calcif & mild MR; mild TR & elevated RV systolic pressure; AV mildly sclerotic; mild aortic root dilatation     Allergies  Allergen Reactions  . Benazepril Other (See Comments)    hyperkalemia  . Fish Allergy Itching  . Omega-3 Fatty Acids Hives and Itching  . Fish Oil Itching and Rash    Current Outpatient Medications  Medication Sig Dispense Refill  . albuterol (PROVENTIL HFA;VENTOLIN HFA) 108 (90 Base) MCG/ACT inhaler Inhale 2 puffs into the lungs every 6 (six) hours as needed for wheezing or shortness of breath. 1 Inhaler 6  . aspirin 81 MG tablet Take 81 mg by mouth daily.    . Marland KitchenYSTOLIC 5 MG tablet TAKE 1 TABLET BY MOUTH EVERY DAY 30 tablet 9  . glucose blood (ONE TOUCH ULTRA TEST) test strip USE 1 STRIP 3 TIMES DAILY - USE AS INSTRUCTED DX: E11.9 100 each 2  . Insulin Detemir (LEVEMIR FLEXTOUCH) 100 UNIT/ML Pen Inject 34 Units into the skin daily. ICD E11.65 15 pen 3  . Insulin Pen Needle (NOVOFINE) 32G X 6 MM MISC 1 each by Other route daily. 100 each 3  . montelukast (SINGULAIR) 10 MG tablet TAKE 1 TABLET BY MOUTH EVERY DAY 90 tablet 3  . nitroGLYCERIN (NITROSTAT) 0.4 MG SL tablet Place 1 tablet (0.4 mg total) under the tongue every 5 (five) minutes as needed for chest pain. 25 tablet 1  . rosuvastatin (CRESTOR) 10 MG tablet Take 10 mg by mouth daily.    . rosuvastatin (CRESTOR) 20 MG tablet Take 20 mg by mouth daily.    .[name]  sacubitril-valsartan (ENTRESTO) 49-51 MG Take 1 tablet by mouth 2 (two) times daily. 60 tablet 5  . Fluticasone Furoate (ARNUITY ELLIPTA) 200 MCG/ACT AEPB Inhale 1 puff into the lungs daily. 30 each 5   No current facility-administered medications for this visit.     Social History   Socioeconomic History  . Marital status: Married    Spouse name: Not on file  . Number of children: 3  . Years of education: Not on file  . Highest education level: Not on file  Occupational History  . 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 [place]  Social Needs  . Financial resource strain: Not on file  . Food insecurity:    Worry: Not on file    Inability: Not on file  . Transportation needs:    Medical: Not on file    Non-medical: Not on file  Tobacco Use  . Smoking status: Former Smoker    Packs/day: 1.00    Years: 50.00    Pack years: 50.00    Types: Cigarettes    Last attempt to quit: 08/04/2017    Years since quitting: 0.3  . Smokeless tobacco: Never Used  Substance and Sexual Activity  . Alcohol use: Yes    Alcohol/week: 0.0 oz    Comment: occasionally  . Drug use: No  . Sexual activity: Yes  Lifestyle  . Physical activity:    Days per week: Not on file    Minutes per session: Not on file  . Stress: Not on file  Relationships  . Social connections:    Talks on phone: Not on file    Gets together: Not on file    Attends religious service: Not on file    Active member of club or organization: Not on file    Attends meetings of clubs or organizations: Not on file    Relationship status: Not on file  . Intimate partner violence:    Fear of current or ex partner: Not on file    Emotionally abused: Not on file    Physically abused: Not on file    Forced sexual activity: Not on file  Other Topics Concern  . Not on file  Social History Narrative  . Not on file   Social history is known that he works as a Freight forwarder for [employer]. There is a  long-standing tobacco history. He rarely drinks alcohol.  Family History  Problem Relation Age of Onset  . Heart attack Father    ROS General: Negative; No fevers, chills, or night sweats; Positive for fatigue  HEENT: Positive for loss of hearing in his right ear Pulmonary: Negative; No cough, wheezing, shortness of breath, hemoptysis Cardiovascular: see history of present illness Positive for possible claudication GI: Negative; No nausea, vomiting, diarrhea, or abdominal pain GU: Negative; No dysuria, hematuria, or difficulty voiding Musculoskeletal: Negative; no myalgias, joint pain, or weakness Hematologic/Oncology: Negative; no easy bruising, bleeding Endocrine: Negative; no heat/cold intolerance; no diabetes Neuro: Negative; no changes in balance, headaches Skin: Negative; No rashes or skin lesions Psychiatric: Negative; No behavioral problems, depression Sleep: Positive for obstructive sleep apnea.  Mild residual daytime sleepiness; no bruxism, restless legs, hypnogognic hallucinations, no cataplexy Other comprehensive 14 point system review is negative.   PE BP (!) 152/80   Pulse (!) 48   Ht 6' 1" (1.854 m)   Wt 227 lb 6.4 oz (103.1 kg)   BMI 30.00 kg/m    Repeat  blood pressure by me was 132/80  Wt Readings from Last 3 Encounters:  12/12/17 227 lb 6.4 oz (103.1 kg)  12/05/17 230 lb (104.3 kg)  11/21/17 228 lb 6.4 oz (103.6 kg)   General: Alert, oriented, no distress.  Skin: normal turgor, no rashes, warm and dry HEENT: Normocephalic, atraumatic. Pupils equal round and reactive to light; sclera anicteric; extraocular muscles intact; Nose without nasal septal hypertrophy Mouth/Parynx benign; Mallinpatti scale 3 Neck: No JVD, no carotid bruits; normal carotid upstroke Lungs: Decreased breath sounds without wheezing Chest wall: without tenderness to palpitation Heart: PMI not displaced, RRR, s1 s2 normal, 1/6 systolic murmur, no diastolic murmur, no rubs, gallops,  thrills, or heaves Abdomen: soft, nontender; no hepatosplenomehaly, BS+; abdominal aorta nontender and not dilated by palpation. Back: no CVA tenderness Pulses 2+ Musculoskeletal: full range of motion, normal strength, no joint deformities Extremities: no clubbing cyanosis or edema, Homan's sign negative  Neurologic: grossly nonfocal; Cranial nerves grossly wnl Psychologic: Normal mood and affect   ECG (independently read by me): Sinus bradycardia at 47 bpm.  Inferior Q waves.  QTc interval 472 ms.  No ectopy.  December 2018 ECG (independently read by me): Sinus bradycardia 53 bpm.  Q waves inferiorly, QTc interval 463 ms.  November 2017 ECG (independently read by me): Atrial fibrillation at 57 bpm.  Inferior Q waves.  An small inferolateral Q waves.  QTc interval 418 ms.  06/05/2016 ECG (independently read by me): Atrial flutter with a rate of 79 bpm with variable block.  QTc interval 467 ms.  November 2017 ECG (independently read by me): Atrial flutter with variable block, ventricular rate at 74.  LVH by voltage criteria in aVL.  Inferior Q waves concordant with prior MI.  May 2017 ECG (independently read by me): Sinus bradycardia at 51 bpm.  Old inferior infarct pattern with prominent inferior lateral Q waves  05/03/2015 ECG (independently read by me): Sinus bradycardia 54 bpm with PVC.  Inferior and lateral Q waves concordant with inferior lateral MI  ECG (independently read by me): Sinus bradycardia 55 bpm.  Inferior Q waves compatible with old inferior MI.  No significant ST segment changes.  January 2015 ECG (independently read by me): Normal sinus rhythm at 75 beats per minute. Old inferior infarction with inferior Q waves. No other ST-T changes  LABS:  BMP Latest Ref Rng & Units 11/05/2017 09/21/2017 09/11/2017  Glucose 65 - 99 mg/dL 134(H) 119(H) 171(H)  BUN 7 - 25 mg/dL _0 Creatinine 0.70 - 1.25 mg/dL 1.24 1.32(H) 1.56(H)  BUN/Creat Ratio 6 - 22 (calc) NOT APPLICABLE  13 -  Sodium 135 - 146 mmol/L 142 143 140  Potassium 3.5 - 5.3 mmol/L 4.8 4.7 3.7  Chloride 98 - 110 mmol/L 105 108 106  CO2 20 - 32 mmol/L _1 Calcium 8.6 - 10.3 mg/dL 9.2 9.0 8.9   Hepatic Function Latest Ref Rng & Units 06/20/2017 04/19/2017 05/09/2016  Total Protein 6.1 - 8.1 g/dL 6.2 6.5 6.2  Albumin 3.6 - 4.8 g/dL - 4.0 4.1  AST 10 - 35 U/L _2 ALT 9 - 46 U/L _3 Alk Phosphatase 39 - 117 IU/L - 84 64  Total Bilirubin 0.2 - 1.2 mg/dL 0.5 0.8 1.0  Bilirubin, Direct - - - -    CBC Latest Ref Rng & Units 09/11/2017 09/11/2017 04/19/2017  WBC 4.0 - 10.5 K/uL 8.2 9.3 10.6  Hemoglobin 13.0 - 17.0 g/dL 14.1 13.8 16.6  Hematocrit 39.0 - 52.0 % 45.0 41.9 48.1  Platelets 150 - 400 K/uL 166 173 182   Lab Results  Component Value Date   MCV 92.4 09/11/2017   MCV 89.0 09/11/2017   MCV 90 04/19/2017   Lab Results  Component Value Date   TSH 1.72 09/11/2017    BNP No results found for: PROBNP  Lipid Panel     Component Value Date/Time   CHOL 129 04/19/2017 1030   TRIG 96 04/19/2017 1030   HDL 35 (L) 04/19/2017 1030   CHOLHDL 3.7 04/19/2017 1030   CHOLHDL 3.7 05/09/2016 0934   VLDL 12 05/09/2016 0934   LDLCALC 75 04/19/2017 1030     RADIOLOGY: No results found.  IMPRESSION:  1. Coronary artery disease involving native coronary artery of native heart without angina pectoris   2. Essential hypertension   3. Hyperlipidemia LDL goal <70   4. OSA on CPAP   5. Interstitial lung disease ([place])   6. Typical atrial flutter ([place]); s/p DC cardioversion 07/28/2016     ASSESSMENT AND PLAN: Mr. Laday is a 67 year-old Caucasian male who has CAD and has undergone prior extensive stenting to his RCA and also stenting to his circumflex vesse dating back to 62.  An echo Doppler study in 2013 showed ejection fraction of 45-50%. There was severe inferior, inferolateral and inferoseptal hypokinesis in the basal inferolateral wall segments consistent with scar.  His last  cardiac catheterization by [name] was in September 2013. A nuclear perfusion study in November 2016 showed a large area of inferior scar without associated ischemia.  The ejection fraction on the nuclear study was reduced at 31%, making the scan, a high-risk study. His echo Doppler study done the same day showed discordant dated with an EF of 50%, but also with inferior hypokinesis.  I reviewed with him his most recent studies, which again showed discordance between the nuclear ejection fraction and echo Doppler study assessment of LV function.  He continues to have findings consistent with inferolateral myocardial infarction.  On his most recent echo ejection fraction was 40-45% and it was grade 1 diastolic dysfunction.  He did not have ischemia on his nuclear study of November 2018.  Recently, he is without anginal symptoms.  He is maintaining sinus rhythm and is bradycardic.  He has had progressive shortness of breath and is felt to have interstitial lung disease and is being thoroughly evaluated by Dr. Charletta Cousin for other etiologies.  With his lung disease I have recommended discontinuance of amiodarone as result his heart rate should increase.  Hopefully he will not have any recurrent arrhythmia and he has not had documented atrial flutter recur since 2017.  I have recommended that if his heart rate increases to greater than 60 bpm and he should increase Bystolic from 5 mg to 7.5 mg. I had a long discussion with him regarding the positive data associated with Entresto particularly with his ischemic cardiomyopathy.  I discussed with him most recent data including reduction in heart failure as well as mortality benefit.  After long discussion he agrees to continue this therapy and since his blood pressure is appropriate today I will titrate his 24/26 mg twice daily regimen to 49/51 mg twice a day.  I provided him with samples of this higher dose.  Ultimately we will try to maximize treatment at 97/103 mg  twice daily.  He continues to be on rosuvastatin for hyperlipidemia with target LDL less than 70.  He is diabetic  on insulin.  He will be following up with [name].  Follow-up pulmonary function studies off amiodarone will be performed in September 2019.  He continues to use CPAP therapy.  I will see him in 2 months for follow-up evaluation.  Time spent: 25 minutes Troy Sine, MD, [facility]  12/14/2017 9:35 PM

## 2017-12-12 NOTE — Telephone Encounter (Signed)
Spoke with patient, # provided to [employer] to see if they are able to help with [facility] cost.    Patient will call today.

## 2017-12-12 NOTE — Telephone Encounter (Signed)
Spoke with pt. He needs a prescription for Arnuity sent in. Rx has been sent in. Nothing further was needed.

## 2017-12-17 ENCOUNTER — Other Ambulatory Visit: Payer: Self-pay | Admitting: Cardiovascular Disease

## 2017-12-17 NOTE — Telephone Encounter (Signed)
Seen by Dr Claiborne Billings last week. Not taking Eliquis at the time of appointment.    LMOM; patient to call back and clarify if Eliquis Rx needed (hx of a flutter)

## 2017-12-18 ENCOUNTER — Other Ambulatory Visit: Payer: Self-pay | Admitting: Family Medicine

## 2017-12-29 DIAGNOSIS — H10021 Other mucopurulent conjunctivitis, right eye: Secondary | ICD-10-CM | POA: Diagnosis not present

## 2018-01-04 ENCOUNTER — Ambulatory Visit (INDEPENDENT_AMBULATORY_CARE_PROVIDER_SITE_OTHER): Payer: Medicare Other | Admitting: Family Medicine

## 2018-01-04 VITALS — BP 158/88 | HR 60 | Temp 98.0°F | Wt 227.0 lb

## 2018-01-04 DIAGNOSIS — I2510 Atherosclerotic heart disease of native coronary artery without angina pectoris: Secondary | ICD-10-CM

## 2018-01-04 DIAGNOSIS — L719 Rosacea, unspecified: Secondary | ICD-10-CM | POA: Diagnosis not present

## 2018-01-04 MED ORDER — DOXYCYCLINE HYCLATE 100 MG PO TABS: 100.0000 mg | ORAL_TABLET | Freq: Two times a day (BID) | ORAL | 0 refills | Status: DC

## 2018-01-04 NOTE — Progress Notes (Signed)
Subjective:    Patient ID: John [name], male    DOB: Jan 17, 1951, 67 y.o.   MRN: [id number]  HPI  Patient states that his nose has a staph infection.  In the past, his nose has become erythematous tender and sore to the touch.  He was given Bactroban and oral antibiotics that helped some but he discontinued the medication and the erythema has returned.  On exam today, the nose is erythematous with no visible swelling.  There is no warmth.  The left side of the nose is tender to palpation along with the superior surface of the left nostril.  There are also some fine erythematous papules on the surface of the nose as well as telangiectasias.  I question if this is an infection such as staph or erysipelas versus underlying rosacea.  I suspect rosacea Past Medical History:  Diagnosis Date  . Coronary artery disease   . Diabetes mellitus   . GERD (gastroesophageal reflux disease)   . History of nuclear stress test 04/04/2011   lexiscan; mod-large in size fixed inferolateral defect (scar); non-diagnostic for ischemia; low risk scan   . Hyperlipidemia   . Hypertension   . Left foot drop    r/t past disk srugery - uses Kevlar brace  . Myocardial infarction ([place])    posterior MI  . Shortness of breath   . Sleep apnea    on CPAP; 04/28/2007 split-night - AHI during total sleep 44.43/hr and REM 72.56/hr   Past Surgical History:  Procedure Laterality Date  . BACK SURGERY  1985  . CARDIAC CATHETERIZATION  2010   6 stents total  . CARDIAC CATHETERIZATION  01/2000   percutaneous transluminal coronary balloon angioplasty of mid RCA stenotic lesion  . CARDIAC CATHETERIZATION  06/2006   no stenting; ischemic cardiomyopathy, EF 40-45%  . CARDIOVERSION N/A 07/28/2016   Procedure: CARDIOVERSION;  Surgeon: Troy Sine, MD;  Location: [facility];  Service: Cardiovascular;  Laterality: N/A;  . CORONARY ANGIOPLASTY  09/1998   mid-distal RCA balloon dilatation, 4.5 & 5.0 stents   . CORONARY  ANGIOPLASTY WITH STENT PLACEMENT  03/1994   angioplasty & stenting (non-DES) of circumflex/prox ramus intermedius  . CORONARY ANGIOPLASTY WITH STENT PLACEMENT  10/1994   large iliac [id number] stent to RCA  . CORONARY ANGIOPLASTY WITH STENT PLACEMENT  12/2002   4.63mm stents x2 of RCA  . CORONARY ANGIOPLASTY WITH STENT PLACEMENT  01/2005   cutting balloon arthrectomy of distal RCA & Cypher DES 3.5x13; cutting balloon arthrectomy of mid RCA with Cypher DES 3.5x18  . CORONARY ANGIOPLASTY WITH STENT PLACEMENT  11/2008   stenting of mid RCA with 4.0x76mm driver, non-DES  . LEFT HEART CATHETERIZATION WITH CORONARY ANGIOGRAM N/A 02/27/2012   Procedure: LEFT HEART CATHETERIZATION WITH CORONARY ANGIOGRAM;  Surgeon: Lorretta Harp, MD;  Location: [facility] C[facility] LAB;  Service: Cardiovascular;  Laterality: N/A;  . TRANSTHORACIC ECHOCARDIOGRAM  07/29/2010   EF 50=55%, mod inf wall hypokinesis & mild post wall hypokinesis; LA mild-mod dilated; mild mitral annular calcif & mild MR; mild TR & elevated RV systolic pressure; AV mildly sclerotic; mild aortic root dilatation    Current Outpatient Medications on File Prior to Visit  Medication Sig Dispense Refill  . albuterol (PROVENTIL HFA;VENTOLIN HFA) 108 (90 Base) MCG/ACT inhaler Inhale 2 puffs into the lungs every 6 (six) hours as needed for wheezing or shortness of breath. 1 Inhaler 6  . aspirin 81 MG tablet Take 81 mg by mouth daily.    [name]  BYSTOLIC 5 MG tablet TAKE 1 TABLET BY MOUTH EVERY DAY 30 tablet 9  . ELIQUIS 5 MG TABS tablet TAKE 1 TABLET BY MOUTH TWICE A DAY 60 tablet 5  . Fluticasone Furoate (ARNUITY ELLIPTA) 200 MCG/ACT AEPB Inhale 1 puff into the lungs daily. 30 each 5  . glucose blood (ONE TOUCH ULTRA TEST) test strip USE 1 STRIP 3 TIMES DAILY - USE AS INSTRUCTED DX: E11.9 100 each 2  . Insulin Pen Needle (NOVOFINE) 32G X 6 MM MISC 1 each by Other route daily. 100 each 3  . LEVEMIR FLEXTOUCH 100 UNIT/ML Pen INJECT 34 UNITS INTO THE SKIN DAILY. ICD E11.65 15  pen 3  . montelukast (SINGULAIR) 10 MG tablet TAKE 1 TABLET BY MOUTH EVERY DAY 90 tablet 3  . nitroGLYCERIN (NITROSTAT) 0.4 MG SL tablet Place 1 tablet (0.4 mg total) under the tongue every 5 (five) minutes as needed for chest pain. 25 tablet 1  . rosuvastatin (CRESTOR) 10 MG tablet Take 10 mg by mouth daily.    . rosuvastatin (CRESTOR) 20 MG tablet Take 20 mg by mouth daily.    . sacubitril-valsartan (ENTRESTO) 49-51 MG Take 1 tablet by mouth 2 (two) times daily. 60 tablet 5   No current facility-administered medications on file prior to visit.    Allergies  Allergen Reactions  . Benazepril Other (See Comments)    hyperkalemia  . Fish Allergy Itching  . Omega-3 Fatty Acids Hives and Itching  . Fish Oil Itching and Rash   Social History   Socioeconomic History  . Marital status: Married    Spouse name: Not on file  . Number of children: 3  . Years of education: Not on file  . Highest education level: Not on file  Occupational History  . 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 [place]  Social Needs  . Financial resource strain: Not on file  . Food insecurity:    Worry: Not on file    Inability: Not on file  . Transportation needs:    Medical: Not on file    Non-medical: Not on file  Tobacco Use  . Smoking status: Former Smoker    Packs/day: 1.00    Years: 50.00    Pack years: 50.00    Types: Cigarettes    Last attempt to quit: 08/04/2017    Years since quitting: 0.4  . Smokeless tobacco: Never Used  Substance and Sexual Activity  . Alcohol use: Yes    Alcohol/week: 0.0 oz    Comment: occasionally  . Drug use: No  . Sexual activity: Yes  Lifestyle  . Physical activity:    Days per week: Not on file    Minutes per session: Not on file  . Stress: Not on file  Relationships  . Social connections:    Talks on phone: Not on file    Gets together: Not on file    Attends religious service: Not on file    Active member of club or  organization: Not on file    Attends meetings of clubs or organizations: Not on file    Relationship status: Not on file  . Intimate partner violence:    Fear of current or ex partner: Not on file    Emotionally abused: Not on file    Physically abused: Not on file    Forced sexual activity: Not on file  Other Topics Concern  . Not on file  Social  History Narrative  . Not on file      Review of Systems  All other systems reviewed and are negative.      Objective:   Physical Exam  Cardiovascular: Normal rate, regular rhythm and normal heart sounds.  Pulmonary/Chest: Effort normal and breath sounds normal. No respiratory distress. He has no wheezes. He has no rales.  Abdominal: Soft. Bowel sounds are normal.  Musculoskeletal: He exhibits no edema.  Vitals reviewed. There are fine telangiectasias covering the surface of the nose Patient's nose is erythematous.  It is tender to palpation over the left nostril and over the left side of the anterior portion of his nose without discernible pustules.  There are some mild small erythematous papules.       Assessment & Plan:  Rosacea - Plan: doxycycline (VIBRA-TABS) 100 MG tablet  Treat with doxycycline 100 mg p.o. twice daily for 10 days.  If symptoms resolve, discontinue the medication and clinically monitor the patient.  If he develops recurrent symptoms, begin daily medication to manage and control rosacea

## 2018-01-09 ENCOUNTER — Other Ambulatory Visit: Payer: Self-pay | Admitting: Cardiovascular Disease

## 2018-01-09 NOTE — Telephone Encounter (Signed)
Rx sent to pharmacy   

## 2018-01-17 ENCOUNTER — Encounter: Payer: Self-pay | Admitting: Podiatry

## 2018-01-17 ENCOUNTER — Ambulatory Visit (INDEPENDENT_AMBULATORY_CARE_PROVIDER_SITE_OTHER): Payer: Medicare Other | Admitting: Podiatry

## 2018-01-17 DIAGNOSIS — M79676 Pain in unspecified toe(s): Secondary | ICD-10-CM

## 2018-01-17 DIAGNOSIS — Q828 Other specified congenital malformations of skin: Secondary | ICD-10-CM | POA: Diagnosis not present

## 2018-01-17 DIAGNOSIS — D689 Coagulation defect, unspecified: Secondary | ICD-10-CM

## 2018-01-17 DIAGNOSIS — B351 Tinea unguium: Secondary | ICD-10-CM

## 2018-01-18 ENCOUNTER — Telehealth: Payer: Self-pay | Admitting: Cardiovascular Disease

## 2018-01-18 NOTE — Telephone Encounter (Signed)
New message   Pt c/o BP issue: STAT if pt c/o blurred vision, one-sided weakness or slurred speech  1. What are your last 5 BP readings? 167/90 174/91 153/95 151/89 158/87  2. Are you having any other symptoms (ex. Dizziness, headache, blurred vision, passed out)? Dizziness   3. What is your BP issue? Blood Pressure is elevated

## 2018-01-18 NOTE — Telephone Encounter (Signed)
Returned call to John [name] he states that his BP has been running a little high and the last time he was at PCP he stated that he wanted to increase or start another BP medication and John [name] told him that he thinks only his cardiologist should do this. BP has been running 167/90 174/91 153/95 151/89 158/87, he has been taking this BID. His HR has been running too low <50. He has upcoming appt 8-29 and would like Dr Claiborne Billings to review and see if he should come in sooner to discuss new BP medication? Please advise

## 2018-01-19 NOTE — Progress Notes (Signed)
He presents today chief complaint of painful elongated toenails.  States he is a diabetic and has been having to pull a callus off the medial aspect of the second digit of the left foot.  States that his last hemoglobin A1c was at 9.3.  This was in January 2019.  Objective: Vital signs are stable he is alert and oriented x3.  Pulses are palpable.  Hammertoe deformities with reactive hyperkeratosis medial aspect of the second toe left foot no open lesions or wounds are noted.  Though he does have thick yellow dystrophic-like mycotic nails.  Diminished sensorium per Semmes Weinstein monofilament.  Assessment: Diabetic peripheral neuropathy hammertoe deformities reactive hyperkeratoses pain in limb secondary to onychomycosis  Plan: Discussed etiology pathology conservative surgical therapies debrided all reactive hyperkeratotic tissue debrided toenails 1 through 5 bilateral.  He will follow-up with Dr. Adah Perl

## 2018-01-21 ENCOUNTER — Telehealth: Payer: Self-pay | Admitting: Internal Medicine

## 2018-01-21 NOTE — Telephone Encounter (Signed)
The patient will be scheduled to see me in several weeks.  If blood pressure remains elevated we can have him see Pharm.D. for Entresto titration to the maximum dose of 97/103 mg twice daily which should improve his blood pressure.

## 2018-01-21 NOTE — Telephone Encounter (Signed)
Was looking at pt's upcoming appts and had one on there for 01/22/18 and also for 03/07/18.  Pt was last seen by MR 12/05/17 and was to return to the office three months from that [place].  Cancelled the OV that was scheduled 01/22/18 due to that OV being too soon and since pt was also needing to have the PFT prior to the [place].  Called pt and left a detailed message on his machine that his upcoming appt was scheduled for 03/07/18 with the PFT at 2:30 followed by the OV with MR at 3pm.  Nothing further needed.

## 2018-01-22 ENCOUNTER — Encounter: Payer: Self-pay | Admitting: Family Medicine

## 2018-01-22 ENCOUNTER — Ambulatory Visit (INDEPENDENT_AMBULATORY_CARE_PROVIDER_SITE_OTHER): Payer: Medicare Other | Admitting: Family Medicine

## 2018-01-22 VITALS — BP 140/70 | HR 54 | Temp 98.0°F | Resp 16 | Ht 72.0 in | Wt 228.0 lb

## 2018-01-22 DIAGNOSIS — R3912 Poor urinary stream: Secondary | ICD-10-CM | POA: Diagnosis not present

## 2018-01-22 DIAGNOSIS — I2510 Atherosclerotic heart disease of native coronary artery without angina pectoris: Secondary | ICD-10-CM | POA: Diagnosis not present

## 2018-01-22 DIAGNOSIS — R300 Dysuria: Secondary | ICD-10-CM

## 2018-01-22 LAB — BASIC METABOLIC PANEL WITH GFR
BUN: 15 mg/dL (ref 7–25)
CO2: 28 mmol/L (ref 20–32)
CREATININE: 1.13 mg/dL (ref 0.70–1.25)
Calcium: 9.2 mg/dL (ref 8.6–10.3)
Chloride: 104 mmol/L (ref 98–110)
GFR, EST NON AFRICAN AMERICAN: 66 mL/min/{1.73_m2} (ref >=60)
GFR, Est African American: 76 mL/min/{1.73_m2} (ref >=60)
GLUCOSE: 164 mg/dL — AB (ref 65–99)
POTASSIUM: 5.3 mmol/L (ref 3.5–5.3)
SODIUM: 141 mmol/L (ref 135–146)

## 2018-01-22 LAB — URINALYSIS, ROUTINE W REFLEX MICROSCOPIC
BACTERIA UA: NONE SEEN /HPF
BILIRUBIN URINE: NEGATIVE
Ketones, ur: NEGATIVE
NITRITE: NEGATIVE
PH: 5.5 (ref 5.0–8.0)
Specific Gravity, Urine: 1.025 (ref 1.001–1.03)

## 2018-01-22 LAB — PSA: PSA: 1.5 ng/mL (ref <=4.0)

## 2018-01-22 LAB — MICROSCOPIC MESSAGE

## 2018-01-22 MED ORDER — SULFAMETHOXAZOLE-TRIMETHOPRIM 800-160 MG PO TABS: 1.0000 | ORAL_TABLET | Freq: Two times a day (BID) | ORAL | 0 refills | Status: DC

## 2018-01-22 MED ORDER — CLOTRIMAZOLE 1 % EX CREA: 1.0000 "application " | TOPICAL_CREAM | Freq: Two times a day (BID) | CUTANEOUS | 0 refills | Status: DC

## 2018-01-22 NOTE — Progress Notes (Signed)
Subjective:    Patient ID: John [name], male    DOB: [birth year], 67 y.o.   MRN: [id number]  HPI  Patient is a 67 year old white male with a history of insulin-dependent diabetes mellitus who I recently treated with doxycycline for possible rosacea.  Please see his last office visit.  After taking antibiotics, he noticed a red rash/discoloration to the head of his penis.  There was no white exudate or discharge.  However at the head of the penis was sore and itched.  He also had some mild dysuria.  He started treating the head of his penis with a topical antibiotic ointment and the symptoms of erythema and discomfort gradually went away.  Today he is pain-free.  He was also experiencing dysuria.  He describes it is felt like he was trying to pee out a small stone.  That subsided yesterday.  This morning he denies any dysuria or hematuria or urgency or frequency.  However around the same time, he has been noticing a weak urinary stream.  He states that he gets up in the morning.  He will urinate.  As soon as he finishes, he feels like he has to go back and urinate again.  He never feels like he is emptying his bladder completely.  He also occasionally has to rush to the bathroom with a sudden urge to urinate however he will be a very weak urinary stream.  On physical exam today, there is a yeastlike rash on the glans penis around the urethra.  There is also an erythematous rash on the foreskin.  On prostate exam, prostate is mildly enlarged.  However it is not edematous.  It is nontender to palpation.  Urinalysis today shows +2 blood, and trace leukocyte esterase. Past Medical History:  Diagnosis Date  . Coronary artery disease   . Diabetes mellitus   . GERD (gastroesophageal reflux disease)   . History of nuclear stress test 04/04/2011   lexiscan; mod-large in size fixed inferolateral defect (scar); non-diagnostic for ischemia; low risk scan   . Hyperlipidemia   . Hypertension   . Left foot  drop    r/t past disk srugery - uses Kevlar brace  . Myocardial infarction ([place])    posterior MI  . Shortness of breath   . Sleep apnea    on CPAP; 04/28/2007 split-night - AHI during total sleep 44.43/hr and REM 72.56/hr   Past Surgical History:  Procedure Laterality Date  . BACK SURGERY  1985  . CARDIAC CATHETERIZATION  2010   6 stents total  . CARDIAC CATHETERIZATION  01/2000   percutaneous transluminal coronary balloon angioplasty of mid RCA stenotic lesion  . CARDIAC CATHETERIZATION  06/2006   no stenting; ischemic cardiomyopathy, EF 40-45%  . CARDIOVERSION N/A 07/28/2016   Procedure: CARDIOVERSION;  Surgeon: Troy Sine, MD;  Location: [place];  Service: Cardiovascular;  Laterality: N/A;  . CORONARY ANGIOPLASTY  09/1998   mid-distal RCA balloon dilatation, 4.5 & 5.0 stents   . CORONARY ANGIOPLASTY WITH STENT PLACEMENT  03/1994   angioplasty & stenting (non-DES) of circumflex/prox ramus intermedius  . CORONARY ANGIOPLASTY WITH STENT PLACEMENT  10/1994   large iliac [id number] stent to RCA  . CORONARY ANGIOPLASTY WITH STENT PLACEMENT  12/2002   4.53mm stents x2 of RCA  . CORONARY ANGIOPLASTY WITH STENT PLACEMENT  01/2005   cutting balloon arthrectomy of distal RCA & Cypher DES 3.5x13; cutting balloon arthrectomy of mid RCA with Cypher DES 3.5x18  . CORONARY ANGIOPLASTY WITH  STENT PLACEMENT  11/2008   stenting of mid RCA with 4.0x59mm driver, non-DES  . LEFT HEART CATHETERIZATION WITH CORONARY ANGIOGRAM N/A 02/27/2012   Procedure: LEFT HEART CATHETERIZATION WITH CORONARY ANGIOGRAM;  Surgeon: Lorretta Harp, MD;  Location: [facility] CATH LAB;  Service: Cardiovascular;  Laterality: N/A;  . TRANSTHORACIC ECHOCARDIOGRAM  07/29/2010   EF 50=55%, mod inf wall hypokinesis & mild post wall hypokinesis; LA mild-mod dilated; mild mitral annular calcif & mild MR; mild TR & elevated RV systolic pressure; AV mildly sclerotic; mild aortic root dilatation    Current Outpatient Medications on File Prior to  Visit  Medication Sig Dispense Refill  . albuterol (PROVENTIL HFA;VENTOLIN HFA) 108 (90 Base) MCG/ACT inhaler Inhale 2 puffs into the lungs every 6 (six) hours as needed for wheezing or shortness of breath. 1 Inhaler 6  . aspirin 81 MG tablet Take 81 mg by mouth daily.    [name] BYSTOLIC 5 MG tablet TAKE 1 TABLET BY MOUTH EVERY DAY 30 tablet 9  . ELIQUIS 5 MG TABS tablet TAKE 1 TABLET BY MOUTH TWICE A DAY 60 tablet 5  . Fluticasone Furoate (ARNUITY ELLIPTA) 200 MCG/ACT AEPB Inhale 1 puff into the lungs daily. 30 each 5  . glucose blood (ONE TOUCH ULTRA TEST) test strip USE 1 STRIP 3 TIMES DAILY - USE AS INSTRUCTED DX: E11.9 100 each 2  . Insulin Pen Needle (NOVOFINE) 32G X 6 MM MISC 1 each by Other route daily. 100 each 3  . LEVEMIR FLEXTOUCH 100 UNIT/ML Pen INJECT 34 UNITS INTO THE SKIN DAILY. ICD E11.65 15 pen 3  . montelukast (SINGULAIR) 10 MG tablet TAKE 1 TABLET BY MOUTH EVERY DAY 90 tablet 3  . nitroGLYCERIN (NITROSTAT) 0.4 MG SL tablet Place 1 tablet (0.4 mg total) under the tongue every 5 (five) minutes as needed for chest pain. 25 tablet 1  . rosuvastatin (CRESTOR) 20 MG tablet TAKE 1 TABLET BY MOUTH EVERYDAY AT BEDTIME 30 tablet 1  . sacubitril-valsartan (ENTRESTO) 49-51 MG Take 1 tablet by mouth 2 (two) times daily. 60 tablet 5   No current facility-administered medications on file prior to visit.    Allergies  Allergen Reactions  . Benazepril Other (See Comments)    hyperkalemia  . Fish Allergy Itching  . Omega-3 Fatty Acids Hives and Itching  . Fish Oil Itching and Rash   Social History   Socioeconomic History  . Marital status: Married    Spouse name: Not on file  . Number of children: 3  . Years of education: Not on file  . Highest education level: Not on file  Occupational History  . 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 [place]  Social Needs  . Financial resource strain: Not on file  . Food insecurity:    Worry: Not on file     Inability: Not on file  . Transportation needs:    Medical: Not on file    Non-medical: Not on file  Tobacco Use  . Smoking status: Former Smoker    Packs/day: 1.00    Years: 50.00    Pack years: 50.00    Types: Cigarettes    Last attempt to quit: 08/04/2017    Years since quitting: 0.4  . Smokeless tobacco: Never Used  Substance and Sexual Activity  . Alcohol use: Yes    Alcohol/week: 0.0 oz    Comment: occasionally  . Drug use: No  . Sexual activity: Yes  Lifestyle  .  Physical activity:    Days per week: Not on file    Minutes per session: Not on file  . Stress: Not on file  Relationships  . Social connections:    Talks on phone: Not on file    Gets together: Not on file    Attends religious service: Not on file    Active member of club or organization: Not on file    Attends meetings of clubs or organizations: Not on file    Relationship status: Not on file  . Intimate partner violence:    Fear of current or ex partner: Not on file    Emotionally abused: Not on file    Physically abused: Not on file    Forced sexual activity: Not on file  Other Topics Concern  . Not on file  Social History Narrative  . Not on file      Review of Systems  All other systems reviewed and are negative.      Objective:   Physical Exam  Cardiovascular: Normal rate, regular rhythm and normal heart sounds.  Pulmonary/Chest: Effort normal and breath sounds normal. No respiratory distress. He has no wheezes. He has no rales.  Abdominal: Soft. Bowel sounds are normal.  Genitourinary: Testes normal.    Prostate is enlarged. Prostate is not tender. Circumcised. Penile erythema present. No discharge found.  Musculoskeletal: He exhibits no edema.  Lymphadenopathy:       Right: No inguinal adenopathy present.       Left: No inguinal adenopathy present.  Vitals reviewed.      Assessment & Plan:  Dysuria - Plan: Urinalysis, Routine w reflex microscopic  Weak urinary stream -  Plan: BASIC METABOLIC PANEL WITH GFR, PSA I definitely believe that the patient has balanitis likely secondary to Candida due to his recent antibiotic use, coupled with his insulin-dependent diabetes.  I will treat this with antifungal cream, Lotrimin, applied twice daily for 7 to 10 days.  Prostate is mildly enlarged but could explain some of his weak urinary stream.  I will check a PSA.  Urinalysis however shows blood and leukocyte esterase.  It is possible the patient may have a mild bladder infection explaining some of his dysuria.  Therefore I will treat the bladder infection with Bactrim double strength tablets twice daily for 5 days.  Await the results of the urine culture.  If urine culture is negative, antibiotics do not help with dysuria, and after the rash is cleared up with Lotrimin, if the patient continues to have weak urinary stream, consider a trial of Flomax

## 2018-01-22 NOTE — Addendum Note (Signed)
Addended by: Shary Decamp B on: 01/22/2018 03:25 PM   Modules accepted: Orders

## 2018-01-22 NOTE — Telephone Encounter (Signed)
[id number]-please get a few days of logged BP's

## 2018-01-23 ENCOUNTER — Ambulatory Visit: Payer: Medicare Other | Admitting: Internal Medicine

## 2018-01-23 LAB — URINE CULTURE
MICRO NUMBER: 90929283
RESULT: NO GROWTH
SPECIMEN QUALITY:: ADEQUATE

## 2018-01-28 ENCOUNTER — Ambulatory Visit: Payer: Medicare Other | Admitting: Family Medicine

## 2018-02-06 NOTE — Telephone Encounter (Signed)
Unable to contact pt will await f/u appt or call back

## 2018-02-14 ENCOUNTER — Encounter: Payer: Self-pay | Admitting: Cardiovascular Disease

## 2018-02-14 ENCOUNTER — Ambulatory Visit (INDEPENDENT_AMBULATORY_CARE_PROVIDER_SITE_OTHER): Payer: Medicare Other | Admitting: Cardiovascular Disease

## 2018-02-14 VITALS — BP 130/78 | HR 50 | Ht 73.0 in | Wt 229.0 lb

## 2018-02-14 DIAGNOSIS — J849 Interstitial pulmonary disease, unspecified: Secondary | ICD-10-CM | POA: Diagnosis not present

## 2018-02-14 DIAGNOSIS — Z79899 Other long term (current) drug therapy: Secondary | ICD-10-CM | POA: Diagnosis not present

## 2018-02-14 DIAGNOSIS — Z9989 Dependence on other enabling machines and devices: Secondary | ICD-10-CM

## 2018-02-14 DIAGNOSIS — E785 Hyperlipidemia, unspecified: Secondary | ICD-10-CM | POA: Diagnosis not present

## 2018-02-14 DIAGNOSIS — G4733 Obstructive sleep apnea (adult) (pediatric): Secondary | ICD-10-CM

## 2018-02-14 DIAGNOSIS — I483 Typical atrial flutter: Secondary | ICD-10-CM

## 2018-02-14 DIAGNOSIS — I5042 Chronic combined systolic (congestive) and diastolic (congestive) heart failure: Secondary | ICD-10-CM | POA: Diagnosis not present

## 2018-02-14 DIAGNOSIS — I2510 Atherosclerotic heart disease of native coronary artery without angina pectoris: Secondary | ICD-10-CM

## 2018-02-14 NOTE — Progress Notes (Signed)
Patient ID: John [name], male   DOB: [birth year], 66 y.o.   MRN: [id number]     HPI:  John [name] is a 67 y.o. male who is a former patient of Dr. Rollene Fare. He presents for a 2 month cardiology followup evaluation.   John [name] has known CAD and underwent multiple percutaneous cardiac interventions with stenting to circumflex and RCA. His last cardiac catheterization was done by [name] in September 2013 showed his RCA stented patent but he had 40% narrowing in the very distal aspect. He has a history of hypertension, as well as long-standing tobacco use with mild polycythemia. I had seen him in a sleep clinic after he obtained a new CPAP machine.  He has severe sleep apnea with an AHI on his initial diagnostic study of 44 per hour overall and 72.6 per hour with REM sleep.  A download last year  showed excellent benefit from CPAP with an AHI of 3.1.  His initial stent to his RCA was in 1996 was a Cypher PS [id number] sent. In 1999 he underwent non-DES stenting to circumflex after he presented with a posterior MI. He had additional 4-5 stents placed in his RCA.  He has a history of prior back surgery and has had a left foot drop.  He has significant obstructive sleep apnea and continues to utilize CPAP therapy.  When I saw him in the past despite CPAP therapy he complained of fatigue and residual daytime sleepiness and I suggested the possibility of Nuvigil or Provigil if necessary.    Remotely he was seen  by [name] complaints of  atypical chest pain.  There was some concern that this may be reflux and pantoprazole was discontinued and replaced with dexilant. However, it was felt that the patient may require another stress test since his last evaluation was in 2012.  He also had had recent issues with some weight gain and shortness of breath and his intermittent Lasix dose changed to 40 mg daily.  On 05/06/2015 he underwent a nuclear stress test which was interpreted as a high risk study  due to an ejection fraction of 31%.  There was a large fixed inferior and inferoseptal wall defect suggestive of scar and evidence for inferoseptal akinesis.  There was no ischemia.   On his prior nuclear study the EF in October 2012 was 38%.  An echo Doppler study done the same day however, showed discordant data and his ejection fraction was 50-55%.  Hypokinesis of the inferior inferolateral wall was noted.  There was grade 1 diastolic dysfunction.  There was mild aortic root dilatation at 41 mm.  His left atrium was severely dilated.  His right atrium was severely dilated.  There was trivial TR.  Of note, his last echo Doppler study had shown an EF of 45-50%.  When I  saw him in November 2017  he stated that he has been using CPAP.  However, for the past month he has noticed significant increased fatigue.  He was also found to have elevated sugars and his insulin was adjusted.  He admits to development of some trace ankle swelling, left greater than right.  He denied any episodes of chest pain.  He was unaware of spells of tachycardia.  He denied presyncope or syncope.  During that evaluation, he was found to be in atrial flutter with variable block at 74 bpm of questionable duration.  At that time, I recommended that he discontinue Plavix and started him on eliquis  5 mg and further titrated his Bystolic to 10 mg.  His cha2ds2vascore is at least 4 and with his CAD he was advised to continue aspirin 81 mg.  He underwent an echo Doppler study on 06/02/2016 which showed an EF of 40-45% with moderate diffuse hypokinesis.  While they are, mild aortic sclerosis and increased atrial septal thickness consistent with lipomatous hypertrophy.  I obtained a download of his CPAP unit from 02/09/2016 through 05/08/2016 and he was continuing to meet Medicare compliance with 91% of days of usage and 79% with usage greater than 4 hours.  He was averaging 5 hours and 55 minutes per night.  AHI was 3.7.  However, he had a very  large leak.  I have recommended that he get a new mask from his DME company.    He underwent successful cardioversion for atrial flutter.  He is unaware of any recurrent arrhythmia.  He continues to be on amiodarone, and Bystolic.  He is on eliquis for anticoagulation.  He has had difficulty with pain in the center of his back.  He has seen [name] and had undergone an MRI and was told of having disc disease.  He has noticed some shortness of breath.  He had undergone PFTs by [name] and was not found to have significant COPD or restrictive lung disease. [name]  He continues to be on amiodarone.  He has been on [id number] mg  And bystolic for hypertension and rosuvastatin 20 mg for hyperlipidemia.  Unfortunately he is still smoking at least a half a pack per day and is smoked for over 55 years.  Continues to use CPAP with 100% compliance.  He is diabetic on insulin.  He is diabetic on insulin.  He is retired as a Chartered certified accountant for [employer]. He had experienced some back discomfort.  He denies any classic exertional chest tightness.    An echo Doppler study on 04/26/2017 which showed an EF of 40-45% with grade 1 diastolic dysfunction.  There was borderline aortic root dilatation.  PA pressure was 31 mm.  He had mild LA and mild to moderate RA .  A follow-up nuclear perfusion study on 04/27/2017 continued to be low risk and showed findings consistent with prior inferolateral myocardial infarction.  The EF on the nuclear study was less than the echo at 34%.   He has had issues with shortness of breath and has been under evaluation by [name].  Fortunately, he finally quit tobacco in February 2019.  He is felt to have interstitial lung disease. Pulmonary function studies have shown restriction with low DLCO.  He felt possibly also to have asthma based on nitric oxide testing.  With his lung disease it has been recommended that amiodarone be considered for discontinuance.  He has had issues with  cough.  His ACE inhibitor was ultimately changed by [name] and he was started on low-dose Entresto at 24/26 mm twice a day in light of his reduce LV function.   I last saw him in June 2019.  His lung disease, I recommended discontinuance of amiodarone.  Commended that if his heart rate increased to greater than 60 bpm should increase Bystolic to 7.5 mg from his current dose at 5 mg.  Long discussion with him regarding Delene Loll importance of therapy and recommended further titration to 49/51 mg twice a day.  He continues to use CPAP therapy mid to 100% compliance.  He has felt well with Entresto.  He is scheduled to  undergo follow-up pulmonary function studies in September and then be seen by Dr. Abigail Miyamoto for pulmonary follow-up. He continues to be concerned about the cost of Entresto.  He presents for reevaluation.   Past Medical History:  Diagnosis Date  . Coronary artery disease   . Diabetes mellitus   . GERD (gastroesophageal reflux disease)   . History of nuclear stress test 04/04/2011   lexiscan; mod-large in size fixed inferolateral defect (scar); non-diagnostic for ischemia; low risk scan   . Hyperlipidemia   . Hypertension   . Left foot drop    r/t past disk srugery - uses Kevlar brace  . Myocardial infarction ([place])    posterior MI  . Shortness of breath   . Sleep apnea    on CPAP; 04/28/2007 split-night - AHI during total sleep 44.43/hr and REM 72.56/hr    Past Surgical History:  Procedure Laterality Date  . BACK SURGERY  1985  . CARDIAC CATHETERIZATION  2010   6 stents total  . CARDIAC CATHETERIZATION  01/2000   percutaneous transluminal coronary balloon angioplasty of mid RCA stenotic lesion  . CARDIAC CATHETERIZATION  06/2006   no stenting; ischemic cardiomyopathy, EF 40-45%  . CARDIOVERSION N/A 07/28/2016   Procedure: CARDIOVERSION;  Surgeon: Troy Sine, MD;  Location: [facility];  Service: Cardiovascular;  Laterality: N/A;  . CORONARY ANGIOPLASTY  09/1998    mid-distal RCA balloon dilatation, 4.5 & 5.0 stents   . CORONARY ANGIOPLASTY WITH STENT PLACEMENT  03/1994   angioplasty & stenting (non-DES) of circumflex/prox ramus intermedius  . CORONARY ANGIOPLASTY WITH STENT PLACEMENT  10/1994   large iliac [id number] stent to RCA  . CORONARY ANGIOPLASTY WITH STENT PLACEMENT  12/2002   4.72m stents x2 of RCA  . CORONARY ANGIOPLASTY WITH STENT PLACEMENT  01/2005   cutting balloon arthrectomy of distal RCA & Cypher DES 3.5x13; cutting balloon arthrectomy of mid RCA with Cypher DES 3.5x18  . CORONARY ANGIOPLASTY WITH STENT PLACEMENT  11/2008   stenting of mid RCA with 4.0x176mdriver, non-DES  . LEFT HEART CATHETERIZATION WITH CORONARY ANGIOGRAM N/A 02/27/2012   Procedure: LEFT HEART CATHETERIZATION WITH CORONARY ANGIOGRAM;  Surgeon: JoLorretta HarpMD;  [facility];  Service: Cardiovascular;  Laterality: N/A;  . TRANSTHORACIC ECHOCARDIOGRAM  07/29/2010   EF 50=55%, mod inf wall hypokinesis & mild post wall hypokinesis; LA mild-mod dilated; mild mitral annular calcif & mild MR; mild TR & elevated RV systolic pressure; AV mildly sclerotic; mild aortic root dilatation     Allergies  Allergen Reactions  . Benazepril Other (See Comments)    hyperkalemia  . Fish Allergy Itching  . Omega-3 Fatty Acids Hives and Itching  . Fish Oil Itching and Rash    Current Outpatient Medications  Medication Sig Dispense Refill  . albuterol (PROVENTIL HFA;VENTOLIN HFA) 108 (90 Base) MCG/ACT inhaler Inhale 2 puffs into the lungs every 6 (six) hours as needed for wheezing or shortness of breath. 1 Inhaler 6  . aspirin 81 MG tablet Take 81 mg by mouth daily.    . Marland KitchenYSTOLIC 5 MG tablet TAKE 1 TABLET BY MOUTH EVERY DAY 30 tablet 9  . ELIQUIS 5 MG TABS tablet TAKE 1 TABLET BY MOUTH TWICE A DAY 60 tablet 5  . Fluticasone Furoate (ARNUITY ELLIPTA) 200 MCG/ACT AEPB Inhale 1 puff into the lungs daily. 30 each 5  . glucose blood (ONE TOUCH ULTRA TEST) test strip USE 1 STRIP 3  TIMES DAILY - USE AS INSTRUCTED DX: E11.9 100 each 2  .  Insulin Pen Needle (NOVOFINE) 32G X 6 MM MISC 1 each by Other route daily. 100 each 3  . LEVEMIR FLEXTOUCH 100 UNIT/ML Pen INJECT 34 UNITS INTO THE SKIN DAILY. ICD E11.65 15 pen 3  . montelukast (SINGULAIR) 10 MG tablet TAKE 1 TABLET BY MOUTH EVERY DAY 90 tablet 3  . nitroGLYCERIN (NITROSTAT) 0.4 MG SL tablet Place 1 tablet (0.4 mg total) under the tongue every 5 (five) minutes as needed for chest pain. 25 tablet 1  . rosuvastatin (CRESTOR) 20 MG tablet TAKE 1 TABLET BY MOUTH EVERYDAY AT BEDTIME 30 tablet 1  . sacubitril-valsartan (ENTRESTO) 49-51 MG Take 1 tablet by mouth 2 (two) times daily. 60 tablet 5   No current facility-administered medications for this visit.     Social History   Socioeconomic History  . Marital status: Married    Spouse name: Not on file  . Number of children: 3  . Years of education: Not on file  . Highest education level: Not on file  Occupational History  . 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 [place]  Social Needs  . Financial resource strain: Not on file  . Food insecurity:    Worry: Not on file    Inability: Not on file  . Transportation needs:    Medical: Not on file    Non-medical: Not on file  Tobacco Use  . Smoking status: Former Smoker    Packs/day: 1.00    Years: 50.00    Pack years: 50.00    Types: Cigarettes    Last attempt to quit: 08/04/2017    Years since quitting: 0.5  . Smokeless tobacco: Never Used  Substance and Sexual Activity  . Alcohol use: Yes    Alcohol/week: 0.0 standard drinks    Comment: occasionally  . Drug use: No  . Sexual activity: Yes  Lifestyle  . Physical activity:    Days per week: Not on file    Minutes per session: Not on file  . Stress: Not on file  Relationships  . Social connections:    Talks on phone: Not on file    Gets together: Not on file    Attends religious service: Not on file    Active member of  club or organization: Not on file    Attends meetings of clubs or organizations: Not on file    Relationship status: Not on file  . Intimate partner violence:    Fear of current or ex partner: Not on file    Emotionally abused: Not on file    Physically abused: Not on file    Forced sexual activity: Not on file  Other Topics Concern  . Not on file  Social History Narrative  . Not on file   Social history is known that he works as a Freight forwarder for [employer]. There is a long-standing tobacco history. He rarely drinks alcohol.  Family History  Problem Relation Age of Onset  . Heart attack Father    ROS General: Negative; No fevers, chills, or night sweats; Positive for fatigue  HEENT: Positive for loss of hearing in his right ear Pulmonary: Under evaluation for possible interstitial lung disease Cardiovascular: see history of present illness Positive for possible claudication GI: Negative; No nausea, vomiting, diarrhea, or abdominal pain GU: Negative; No dysuria, hematuria, or difficulty voiding Musculoskeletal: Negative; no myalgias, joint pain, or weakness Hematologic/Oncology: Negative; no easy bruising, bleeding Endocrine: Negative; no heat/cold intolerance; no  diabetes Neuro: Negative; no changes in balance, headaches Skin: Negative; No rashes or skin lesions Psychiatric: Negative; No behavioral problems, depression Sleep: Positive for obstructive sleep apnea.  Mild residual daytime sleepiness; no bruxism, restless legs, hypnogognic hallucinations, no cataplexy Other comprehensive 14 point system review is negative.   PE BP 130/78   Pulse (!) 50   Ht 6' 1" (1.854 m)   Wt 229 lb (103.9 kg)   BMI 30.21 kg/m    Repeat by me 114/76  Wt Readings from Last 3 Encounters:  02/14/18 229 lb (103.9 kg)  01/22/18 228 lb (103.4 kg)  01/04/18 227 lb (103 kg)   General: Alert, oriented, no distress.  Skin: normal turgor, no rashes, warm and dry HEENT: Normocephalic,  atraumatic. Pupils equal round and reactive to light; sclera anicteric; extraocular muscles intact;  Nose without nasal septal hypertrophy Mouth/Parynx benign; Mallinpatti scale 3Neck: No JVD, no carotid bruits; normal carotid upstroke Lungs: clear to ausculatation and percussion; no wheezing or rales Chest wall: without tenderness to palpitation Heart: PMI not displaced, RRR, s1 s2 normal, 1/6 systolic murmur, no diastolic murmur, no rubs, gallops, thrills, or heaves Abdomen: soft, nontender; no hepatosplenomehaly, BS+; abdominal aorta nontender and not dilated by palpation. Back: no CVA tenderness Pulses 2+ Musculoskeletal: full range of motion, normal strength, no joint deformities Extremities: no clubbing cyanosis or edema, Homan's sign negative  Neurologic: grossly nonfocal; Cranial nerves grossly wnl Psychologic: Normal mood and affect   ECG (independently read by me): Sinus bradycardia 50.  Old inferior infarct with Q waves fairly  June 2019 ECG (independently read by me): Sinus bradycardia at 47 bpm.  Inferior Q waves.  QTc interval 472 ms.  No ectopy.  December 2018 ECG (independently read by me): Sinus bradycardia 53 bpm.  Q waves inferiorly, QTc interval 463 ms.  November 2017 ECG (independently read by me): Atrial fibrillation at 57 bpm.  Inferior Q waves.  An small inferolateral Q waves.  QTc interval 418 ms.  06/05/2016 ECG (independently read by me): Atrial flutter with a rate of 79 bpm with variable block.  QTc interval 467 ms.  November 2017 ECG (independently read by me): Atrial flutter with variable block, ventricular rate at 74.  LVH by voltage criteria in aVL.  Inferior Q waves concordant with prior MI.  May 2017 ECG (independently read by me): Sinus bradycardia at 51 bpm.  Old inferior infarct pattern with prominent inferior lateral Q waves  05/03/2015 ECG (independently read by me): Sinus bradycardia 54 bpm with PVC.  Inferior and lateral Q waves concordant with  inferior lateral MI  ECG (independently read by me): Sinus bradycardia 55 bpm.  Inferior Q waves compatible with old inferior MI.  No significant ST segment changes.  January 2015 ECG (independently read by me): Normal sinus rhythm at 75 beats per minute. Old inferior infarction with inferior Q waves. No other ST-T changes  LABS:  BMP Latest Ref Rng & Units 02/14/2018 01/22/2018 11/05/2017  Glucose 65 - 99 mg/dL 215(H) 164(H) 134(H)  BUN 8 - 27 mg/dL _0 Creatinine 0.76 - 1.27 mg/dL 1.16 1.13 1.24  BUN/Creat Ratio 10 - 24 16 NOT APPLICABLE NOT APPLICABLE  Sodium 016 - 144 mmol/L 145(H) 141 142  Potassium 3.5 - 5.2 mmol/L 5.1 5.3 4.8  Chloride 96 - 106 mmol/L 105 104 105  CO2 20 - 29 mmol/L _1 Calcium 8.6 - 10.2 mg/dL 9.1 9.2 9.2   Hepatic Function Latest Ref Rng & Units 06/20/2017  04/19/2017 05/09/2016  Total Protein 6.1 - 8.1 g/dL 6.2 6.5 6.2  Albumin 3.6 - 4.8 g/dL - 4.0 4.1  AST 10 - 35 U/L _0 ALT 9 - 46 U/L _1 Alk Phosphatase 39 - 117 IU/L - 84 64  Total Bilirubin 0.2 - 1.2 mg/dL 0.5 0.8 1.0  Bilirubin, Direct - - - -    CBC Latest Ref Rng & Units 09/11/2017 09/11/2017 04/19/2017  WBC 4.0 - 10.5 K/uL 8.2 9.3 10.6  Hemoglobin 13.0 - 17.0 g/dL 14.1 13.8 16.6  Hematocrit 39.0 - 52.0 % 45.0 41.9 48.1  Platelets 150 - 400 K/uL 166 173 182   Lab Results  Component Value Date   MCV 92.4 09/11/2017   MCV 89.0 09/11/2017   MCV 90 04/19/2017   Lab Results  Component Value Date   TSH 1.72 09/11/2017    BNP    Component Value Date/Time   PROBNP 411 (H) 02/14/2018 1431    Lipid Panel     Component Value Date/Time   CHOL 129 04/19/2017 1030   TRIG 96 04/19/2017 1030   HDL 35 (L) 04/19/2017 1030   CHOLHDL 3.7 04/19/2017 1030   CHOLHDL 3.7 05/09/2016 0934   VLDL 12 05/09/2016 0934   LDLCALC 75 04/19/2017 1030     RADIOLOGY: No results found.  IMPRESSION:  1. Coronary artery disease involving native coronary artery of native heart without  angina pectoris   2. Chronic combined systolic and diastolic congestive heart failure ([place])   3. Medication management   4. Hyperlipidemia LDL goal <70   5. OSA on CPAP   6. Interstitial lung disease ([place])   7. Typical atrial flutter ([place]); s/p DC cardioversion 07/28/2016     ASSESSMENT AND PLAN: John [name] is a 67 year-old Caucasian male who has CAD and has undergone prior extensive stenting to his RCA and also stenting to his circumflex vesse dating back to 45.  An echo Doppler study in 2013 showed ejection fraction of 45-50%. There was severe inferior, inferolateral and inferoseptal hypokinesis in the basal inferolateral wall segments consistent with scar.  His last cardiac catheterization by [name] was in September 2013. A nuclear perfusion study in November 2016 showed a large area of inferior scar without associated ischemia.  The ejection fraction on the nuclear study was reduced at 31%, making the scan, a high-risk study. His echo Doppler study done the same day showed discordant dated with an EF of 50%, but also with inferior hypokinesis.  I reviewed with him his most recent studies, which again showed discordance between the nuclear ejection fraction and echo Doppler study assessment of LV function.  He continues to have findings consistent with inferolateral myocardial infarction.  His most recent echo Doppler study November 2018 showed an EF of 40 to 45% with mild LVH, grade 1 diastolic dysfunction, and old inferior scar.  PA pressure was 31 mm.  I last saw him, because of concern for interstitial lung disease I discontinued amiodarone.  He is maintaining sinus rhythm and has not had any recurrence of his previous atrial flutter flutter.  Continues to be on delicate 5 mg with control of ventricular rate.  I discussed with him further titration of Entresto up to 97/103 mg twice a day but at present he would prefer not to do this.  He denies any shortness of breath and would like to stay  on the current dose if at all possible.  He continues to be on  aspirin in light of his CAD and Eliquis.  He is diabetic on insulin.  He is on rosuvastatin 20 mg for hyperlipidemia with target LDL less than 70.  He will be undergoing follow-up pulmonary function studies scheduled for September 19 with follow-up evaluation with [name].  Blood pressure is stable at 114/76 on repeat by me but he believes his blood pressure monitor at home may not be accurate.  He will be obtaining a new one and will then obtain follow-up recordings.  I will recheck a bmet and proBNP level today on his current dose of Entresto.  He is to use CPAP with 100% compliance.  I will see him in 4 months for reevaluation.   Time spent: 25 minutes Troy Sine, MD, [facility]  02/16/2018 3:54 PM

## 2018-02-14 NOTE — Patient Instructions (Signed)
Medication Instructions:  Your physician recommends that you continue on your current medications as directed. Please refer to the Current Medication list given to you today.  Labwork: TODAY (BMET, ProBNP)  Follow-Up: 4 months with Dr. Claiborne Billings  Any Other Special Instructions Will Be Listed Below (If Applicable).     If you need a refill on your cardiac medications before your next appointment, please call your pharmacy.

## 2018-02-15 LAB — BASIC METABOLIC PANEL
BUN/Creatinine Ratio: 16 (ref 10–24)
BUN: 18 mg/dL (ref 8–27)
CALCIUM: 9.1 mg/dL (ref 8.6–10.2)
CO2: 23 mmol/L (ref 20–29)
Chloride: 105 mmol/L (ref 96–106)
Creatinine, Ser: 1.16 mg/dL (ref 0.76–1.27)
GFR calc Af Amer: 74 mL/min/{1.73_m2} (ref >59)
GFR calc non Af Amer: 64 mL/min/{1.73_m2} (ref >59)
GLUCOSE: 215 mg/dL — AB (ref 65–99)
POTASSIUM: 5.1 mmol/L (ref 3.5–5.2)
SODIUM: 145 mmol/L — AB (ref 134–144)

## 2018-02-15 LAB — PRO B NATRIURETIC PEPTIDE: NT-Pro BNP: 411 pg/mL — ABNORMAL HIGH (ref 0–376)

## 2018-02-16 ENCOUNTER — Encounter: Payer: Self-pay | Admitting: Cardiovascular Disease

## 2018-02-19 ENCOUNTER — Other Ambulatory Visit: Payer: Medicare Other

## 2018-02-19 DIAGNOSIS — Z794 Long term (current) use of insulin: Principal | ICD-10-CM

## 2018-02-19 DIAGNOSIS — I10 Essential (primary) hypertension: Secondary | ICD-10-CM

## 2018-02-19 DIAGNOSIS — E785 Hyperlipidemia, unspecified: Secondary | ICD-10-CM | POA: Diagnosis not present

## 2018-02-19 DIAGNOSIS — E119 Type 2 diabetes mellitus without complications: Principal | ICD-10-CM

## 2018-02-19 DIAGNOSIS — IMO0001 Reserved for inherently not codable concepts without codable children: Secondary | ICD-10-CM

## 2018-02-20 LAB — LIPID PANEL
CHOLESTEROL: 109 mg/dL (ref <200)
HDL: 33 mg/dL — AB (ref >40)
LDL Cholesterol (Calc): 60 mg/dL (calc)
NON-HDL CHOLESTEROL (CALC): 76 mg/dL (ref <130)
Total CHOL/HDL Ratio: 3.3 (calc) (ref <5.0)
Triglycerides: 77 mg/dL (ref <150)

## 2018-02-20 LAB — HEMOGLOBIN A1C
EAG (MMOL/L): 13.3 (calc)
HEMOGLOBIN A1C: 10 %{Hb} — AB (ref <5.7)
Mean Plasma Glucose: 240 (calc)

## 2018-02-26 ENCOUNTER — Encounter: Payer: Self-pay | Admitting: Internal Medicine

## 2018-03-07 ENCOUNTER — Ambulatory Visit: Payer: Medicare Other | Admitting: Internal Medicine

## 2018-03-08 DIAGNOSIS — Z85828 Personal history of other malignant neoplasm of skin: Secondary | ICD-10-CM | POA: Diagnosis not present

## 2018-03-08 DIAGNOSIS — L570 Actinic keratosis: Secondary | ICD-10-CM | POA: Diagnosis not present

## 2018-03-08 DIAGNOSIS — D225 Melanocytic nevi of trunk: Secondary | ICD-10-CM | POA: Diagnosis not present

## 2018-03-08 DIAGNOSIS — D1801 Hemangioma of skin and subcutaneous tissue: Secondary | ICD-10-CM | POA: Diagnosis not present

## 2018-03-08 DIAGNOSIS — L578 Other skin changes due to chronic exposure to nonionizing radiation: Secondary | ICD-10-CM | POA: Diagnosis not present

## 2018-03-08 DIAGNOSIS — Z85820 Personal history of malignant melanoma of skin: Secondary | ICD-10-CM | POA: Diagnosis not present

## 2018-03-08 DIAGNOSIS — L821 Other seborrheic keratosis: Secondary | ICD-10-CM | POA: Diagnosis not present

## 2018-03-08 DIAGNOSIS — L814 Other melanin hyperpigmentation: Secondary | ICD-10-CM | POA: Diagnosis not present

## 2018-03-14 ENCOUNTER — Other Ambulatory Visit (INDEPENDENT_AMBULATORY_CARE_PROVIDER_SITE_OTHER): Payer: Medicare Other

## 2018-03-14 ENCOUNTER — Ambulatory Visit (INDEPENDENT_AMBULATORY_CARE_PROVIDER_SITE_OTHER): Payer: Medicare Other | Admitting: Internal Medicine

## 2018-03-14 ENCOUNTER — Encounter: Payer: Self-pay | Admitting: Internal Medicine

## 2018-03-14 VITALS — BP 140/80 | HR 53 | Ht 73.0 in | Wt 231.0 lb

## 2018-03-14 DIAGNOSIS — J849 Interstitial pulmonary disease, unspecified: Secondary | ICD-10-CM

## 2018-03-14 DIAGNOSIS — R768 Other specified abnormal immunological findings in serum: Secondary | ICD-10-CM

## 2018-03-14 DIAGNOSIS — R748 Abnormal levels of other serum enzymes: Secondary | ICD-10-CM | POA: Diagnosis not present

## 2018-03-14 DIAGNOSIS — R0600 Dyspnea, unspecified: Secondary | ICD-10-CM

## 2018-03-14 DIAGNOSIS — I2510 Atherosclerotic heart disease of native coronary artery without angina pectoris: Secondary | ICD-10-CM

## 2018-03-14 DIAGNOSIS — Z23 Encounter for immunization: Secondary | ICD-10-CM | POA: Diagnosis not present

## 2018-03-14 LAB — PULMONARY FUNCTION TEST
DL/VA % pred: 64 %
DL/VA: 3.08 ml/min/mmHg/L
DLCO unc % pred: 53 %
DLCO unc: 19.62 ml/min/mmHg
FEF 25-75 PRE: 3.68 L/s
FEF2575-%Pred-Pre: 131 %
FEV1-%PRED-PRE: 94 %
FEV1-PRE: 3.48 L
FEV1FVC-%Pred-Pre: 109 %
FEV6-%PRED-PRE: 91 %
FEV6-PRE: 4.29 L
FEV6FVC-%Pred-Pre: 105 %
FVC-%Pred-Pre: 86 %
FVC-Pre: 4.29 L
PRE FEV1/FVC RATIO: 81 %
Pre FEV6/FVC Ratio: 100 %

## 2018-03-14 LAB — CREATININE KINASE MB: CK MB: 9.5 ng/mL — AB (ref 0.3–4.0)

## 2018-03-14 LAB — CK: Total CK: 104 U/L (ref 7–232)

## 2018-03-14 NOTE — Progress Notes (Signed)
Spirometry and Dlco done today. 

## 2018-03-14 NOTE — Patient Instructions (Addendum)
ILD (interstitial lung disease) ([place])  - probably still present based on breathing test but improved - repeat Spirometry and DLCO in 7-9 months  - repeat HRCT in 7-9 months - in interim if you get worse, please call us or return sooner -  high dose flu shot 03/14/2018  - might consider bronch bal if symptoms perisist (not a good bx canddiate)   Elevated CK-MB level Positive ANA (antinuclear antibody)  - probably false positive - might be related to crestor esp CK-MB muscle enzyme -recheck ANA, DS-DNA, CK and CK-MB  Followup 7-9 months to ILD clinic

## 2018-03-14 NOTE — Progress Notes (Signed)
HPI  IOV 11/06/2017  Chief Complaint  Patient presents with  . Consult    Referred by Dr. Cindi Carbon for dyspnea on exertion X1 year.     67 year old former smoker who worked in a [employer] as a Freight forwarder without any metal dust or organic dust exposure.  Only exposure being cigarette smoking with a 50 pack smoking history quit earlier in 2019 and ongoing amiodarone.  He is known to have chronic systolic heart failure with ejection fraction around 40-45% although November 2018 stress test this at reduced to 35%.  He is reporting insidious onset of worsening shortness of breath on exertion since summer 2018 and it is progressive.  One year ago he could do hike [place] halfway with his granddaughter but currently he says doing yard work and moving across rooms makes him dyspneic and he has to stop.  There is a chest tightness in the shape of an inverted Y from the neck to the center of the chest but he says this is not chest pain.  In November 2018 he did have cardiac stress test and there is no ongoing active ischemia.  In March 2019 he reported to any pain emergency department and I reviewed this chart where he had hypertension related pulmonary congestion on chest x-ray.  This was medically treated in the emergency department and discharged since then he is says this is improved but dyspnea on exertion continues to get worse.  There is no orthopnea [he does use chronic stable CPAP for many years] or proximal nocturnal dyspnea or worsening edema.  In fact he does not have much edema at all.  Walking desaturation test today was normal to exam he did get mildly short of breath but no chest pain  Of note symptoms are associated with some nonspecific cough but no wheezing   Recent lab review  Known chronic systolic heart failure but December 2017 in November 2018 echocardiogram showing ejection fraction 40-45% but a nuclear medicine cardiac stress test November 2018 showing reduced ejection  fraction to 35%   Personal visualization of imaging March 2014 CT angiogram chest he seems to have a 5 mm nodule but otherwise clear lung field [the official radiology report does not comment about this nodule].  March 2019 chest x-ray shows cardiomegaly with congestive heart failure findings.  I personally visualized both these images   Recent labs include creatinine 1.24 mg percent with GFR 59 in May 2019 and a hemoglobin of 14.1 g% in March 2019 Exam nitric oxide test today in the office: 52 ppb and elevated  OV 12/05/2017  Chief Complaint  Patient presents with  . Follow-up    HRCT performed 6/6 and PFT performed 6/13. Pt states he is about the same as he was at last visit. Pt states he still has some problems becoming SOB with exertion and states he will be seeing his cardiologist soon to see if they can find out the problem. Pt denies any coughing but states he has to clear his throat a lot and at times he has problem talking and becomes hoarse.    Follow-up progressive shortness of breath workup in the setting of chronic systolic heart failure that is deemed stable, previous heavy smoking history and amiodarone exposure  In this visit he reports that his shortness of breath is stable but in the interim has developed a cough. He tells me that the cough started 6 weeks ago just prior last visit which was 4 weeks  ago. He says this coincides withhis primary care physician putting him on an  Entreso for his chronic systolic heart failure. In the past she's had ACE inhibitor cough. At last visit his exhaled nitric oxide was elevated greater than 50 ppb. His cough has continued despite Singulair and inhaled steroid. He clearly believes that the heart failure medication is causing his cough. He wants to come off it. In terms of his shortness of breath workup for interstitial lung disease he had pulmonary function test that shows restriction with low DLCO. The concomitant high-resolution CT scan of  the chest-show evidence of ILD. I personally visualized the findings and confirmed that it is indeed present and it is new since 2014.. I agree with the thoracic radiologist findings of this is indeterminate but having said that it has a bibasal subpleural reticular pattern. This is not much of groundglass opacities. This brings the probability that this is UIP less than 50%. He denies any autoimmune findings.  [facility] -  specific interstitial lung disease questionnaire  SymptomS:reports insidious onset of shortness of breath for the last 1 year. It is progressive since it started. He rates it as a level IV while eating brushing the teeth or shaving and a level III while showering or dressing and the level for 4 dishes a laundry and a level III for walking on level at his own pace and shopping and the level fall while watering the lawn. He has a cough for the last 6 weeks it is dry. This no associated wheezing. There is associated clearing of throat  Past medical history: Positive for heart failureand history of asthma and negative autoimmune or collagen vascular disease. Positive for sleep apnea for which he is on CPAP. Positive for diabetes  Review of systems: Positive for arthralgia and fatigue but negative for any sicca syndromes Raynaud  Family history of pulmonary issues: Negative for pulmonary fibrosis cystic fibrosis, COPD or fever  MRSA exposure history: Previous smoker. Negative for cocaine or IV drug abuse. [place].  Occupational history: He lives in a condition space but otherwise extensive occupational history is negative  Medication history positive for amiodarone but otherwise extensive medication history is negative       OV 03/14/2018  Subjective:  Patient ID: [name], male , DOB: 01-06-1951 , age 60 y.o. , MRN: [id number] , ADDRESS: [address] [place] [address]   03/14/2018 -   Chief  Complaint  Patient presents with  . Follow-up    breathing improved since using nebulizer but remains concerned about hoarseness.       HPI DAGMAWI VENABLE 67 y.o. -follow-up idiopathic ILD associated with trace positive ANA, double-stranded DNA and slightly elevated CK-MB level but normal CK.  Associated with amiodarone intake.  New onset CT findings in June 2019 compared to 2014  Since I last saw him in June 2019 he has stopped taking amiodarone.  This history is determined by review of the chart because he himself was not sure.  He says his dyspnea and effort tolerance is improved.  In fact when we walked him he did not have dyspnea on exertion like he did last time.  But heart rate response and pulse ox response were normal as before.  He had pulmonary function test today with a DLCO is unchanged but the FVC is improved.  I noticed that he continues to be on statin which he probably  needs because of his 11 cardiac stents.  He has seen Dr. Claiborne Billings recently.  I reviewed that note.    Simple office walk 185 feet x  3 laps goal with forehead probe 11/06/2017  03/14/2018   O2 used Room air Room air  Number laps completed All 3 All 3  Comments about pace No comment Good pace  Resting Pulse Ox/HR 98% and 50/min 98% and 53/min  Final Pulse Ox/HR 96% and 57/min 97% and 73/min  Desaturated </= 88% no no  Desaturated <= 3% points no no  Got Tachycardic >/= 90/min no no  Symptoms at end of test Mild dyspnea No complaints  Miscellaneous comments No comment Tolerated well     Results for DELTON, STELLE (MRN [id number]) as of 12/05/2017 10:21  Ref. Range 11/29/2017 10:46 03/14/2018   FVC-Pre Latest Units: L 3.88 4.29  FVC-%Pred-Pre Latest Units: % 77 86%  Results for ISADORE, PALECEK (MRN [id number]) as of 12/05/2017 10:21  Ref. Range 11/29/2017 10:46 03/14/2018   DLCO unc Latest Units: ml/min/mmHg 19.16 19.62  DLCO unc % pred Latest Units: % 52 53%    ROS - per HPI     has a past  medical history of Coronary artery disease, Diabetes mellitus, GERD (gastroesophageal reflux disease), History of nuclear stress test (04/04/2011), Hyperlipidemia, Hypertension, Left foot drop, Myocardial infarction [facility]), Shortness of breath, and Sleep apnea.   reports that he quit smoking about 7 months ago. His smoking use included cigarettes. He has a 50.00 pack-year smoking history. He has never used smokeless tobacco.  Past Surgical History:  Procedure Laterality Date  . BACK SURGERY  1985  . CARDIAC CATHETERIZATION  2010   6 stents total  . CARDIAC CATHETERIZATION  01/2000   percutaneous transluminal coronary balloon angioplasty of mid RCA stenotic lesion  . CARDIAC CATHETERIZATION  06/2006   no stenting; ischemic cardiomyopathy, EF 40-45%  . CARDIOVERSION N/A 07/28/2016   Procedure: CARDIOVERSION;  Surgeon: Troy Sine, MD;  Location: [facility];  Service: Cardiovascular;  Laterality: N/A;  . CORONARY ANGIOPLASTY  09/1998   mid-distal RCA balloon dilatation, 4.5 & 5.0 stents   . CORONARY ANGIOPLASTY WITH STENT PLACEMENT  03/1994   angioplasty & stenting (non-DES) of circumflex/prox ramus intermedius  . CORONARY ANGIOPLASTY WITH STENT PLACEMENT  10/1994   large iliac [id number] stent to RCA  . CORONARY ANGIOPLASTY WITH STENT PLACEMENT  12/2002   4.72m stents x2 of RCA  . CORONARY ANGIOPLASTY WITH STENT PLACEMENT  01/2005   cutting balloon arthrectomy of distal RCA & Cypher DES 3.5x13; cutting balloon arthrectomy of mid RCA with Cypher DES 3.5x18  . CORONARY ANGIOPLASTY WITH STENT PLACEMENT  11/2008   stenting of mid RCA with 4.0x110mdriver, non-DES  . LEFT HEART CATHETERIZATION WITH CORONARY ANGIOGRAM N/A 02/27/2012   Procedure: LEFT HEART CATHETERIZATION WITH CORONARY ANGIOGRAM;  Surgeon: JoLorretta HarpMD;  [facility];  Service: Cardiovascular;  Laterality: N/A;  . TRANSTHORACIC ECHOCARDIOGRAM  07/29/2010   EF 50=55%, mod inf wall hypokinesis & mild post wall hypokinesis;  LA mild-mod dilated; mild mitral annular calcif & mild MR; mild TR & elevated RV systolic pressure; AV mildly sclerotic; mild aortic root dilatation     Allergies  Allergen Reactions  . Benazepril Other (See Comments)    hyperkalemia  . Fish Allergy Itching  . Omega-3 Fatty Acids Hives and Itching  . Fish Oil Itching and Rash    Immunization History  Administered Date(s) Administered  . Influenza, High  Dose Seasonal PF 03/15/2017, 03/14/2018  . Influenza, Seasonal, Injecte, Preservative Fre 04/07/2013  . Influenza,inj,Quad PF,6+ Mos 06/04/2014, 04/08/2015, 04/18/2016  . Pneumococcal Conjugate-13 01/02/2014  . Pneumococcal Polysaccharide-23 01/07/2016  . Tdap 12/29/2012    Family History  Problem Relation Age of Onset  . Heart attack Father      Current Outpatient Medications:  .  albuterol (PROVENTIL HFA;VENTOLIN HFA) 108 (90 Base) MCG/ACT inhaler, Inhale 2 puffs into the lungs every 6 (six) hours as needed for wheezing or shortness of breath., Disp: 1 Inhaler, Rfl: 6 .  aspirin 81 MG tablet, Take 81 mg by mouth daily., Disp: , Rfl:  .  BYSTOLIC 5 MG tablet, TAKE 1 TABLET BY MOUTH EVERY DAY, Disp: 30 tablet, Rfl: 9 .  ELIQUIS 5 MG TABS tablet, TAKE 1 TABLET BY MOUTH TWICE A DAY, Disp: 60 tablet, Rfl: 5 .  Fluticasone Furoate (ARNUITY ELLIPTA) 200 MCG/ACT AEPB, Inhale 1 puff into the lungs daily., Disp: 30 each, Rfl: 5 .  glucose blood (ONE TOUCH ULTRA TEST) test strip, USE 1 STRIP 3 TIMES DAILY - USE AS INSTRUCTED DX: E11.9, Disp: 100 each, Rfl: 2 .  Insulin Pen Needle (NOVOFINE) 32G X 6 MM MISC, 1 each by Other route daily., Disp: 100 each, Rfl: 3 .  LEVEMIR FLEXTOUCH 100 UNIT/ML Pen, INJECT 34 UNITS INTO THE SKIN DAILY. ICD E11.65, Disp: 15 pen, Rfl: 3 .  montelukast (SINGULAIR) 10 MG tablet, TAKE 1 TABLET BY MOUTH EVERY DAY, Disp: 90 tablet, Rfl: 3 .  nitroGLYCERIN (NITROSTAT) 0.4 MG SL tablet, Place 1 tablet (0.4 mg total) under the tongue every 5 (five) minutes as needed  for chest pain., Disp: 25 tablet, Rfl: 1 .  rosuvastatin (CRESTOR) 20 MG tablet, TAKE 1 TABLET BY MOUTH EVERYDAY AT BEDTIME, Disp: 30 tablet, Rfl: 1 .  sacubitril-valsartan (ENTRESTO) 49-51 MG, Take 1 tablet by mouth 2 (two) times daily., Disp: 60 tablet, Rfl: 5      Objective:   Vitals:   03/14/18 1604  BP: 140/80  Pulse: (!) 53  SpO2: 98%  Weight: 231 lb (104.8 kg)  Height: _0  (1.854 m)    Estimated body mass index is 30.48 kg/m as calculated from the following:   Height as of this encounter: _1  (1.854 m).   Weight as of this encounter: 231 lb (104.8 kg).  _2 @  [employer]   03/14/18 1604  Weight: 231 lb (104.8 kg)     Physical Exam  General Appearance:    Alert, cooperative, no distress, appears stated age - look a bit older , Deconditioned looking - mild , OBESE  - yes, Sitting on Wheelchair -  no  Head:    Normocephalic, without obvious abnormality, atraumatic  Eyes:    PERRL, conjunctiva/corneas clear,  Ears:    Normal TM's and external ear canals, both ears  Nose:   Nares normal, septum midline, mucosa normal, no drainage    or sinus tenderness. OXYGEN ON  - no . Patient is @ ra   Throat:   Lips, mucosa, and tongue normal; teeth and gums normal. Cyanosis on lips - no  Neck:   Supple, symmetrical, trachea midline, no adenopathy;    thyroid:  no enlargement/tenderness/nodules; no carotid   bruit or JVD  Back:     Symmetric, no curvature, ROM normal, no CVA tenderness  Lungs:     Distress - no , Wheeze no, Barrell Chest - no, Purse lip breathing - no, Crackles - mild baes yes  Chest Wall:    No tenderness or deformity.    Heart:    Regular rate and rhythm, S1 and S2 normal, no rub   or gallop, Murmur - no  Breast Exam:    NOT DONE  Abdomen:     Soft, non-tender, bowel sounds active all four quadrants,    no masses, no organomegaly. Visceral obesity - yes  Genitalia:   NOT DONE  Rectal:   NOT DONE  Extremities:   Extremities - normal, Has Cane  - no, Clubbing - no, Edema - no  Pulses:   2+ and symmetric all extremities  Skin:   Stigmata of Connective Tissue Disease - no  Lymph nodes:   Cervical, supraclavicular, and axillary nodes normal  Psychiatric:  Neurologic:   Pleasant - yes, Anxious - no, Flat affect - maybe  CAm-ICU - neg, Alert and Oriented x 3 - yes, Moves all 4s - yes, Speech - normal, Cognition - intact           Assessment:       ICD-10-CM   1. ILD (interstitial lung disease) ([facility]) J84.9   2. Elevated CK-MB level R74.8   3. Positive ANA (antinuclear antibody) R76.8   4. Encounter for immunization Z23 Flu vaccine HIGH DOSE PF  5. Interstitial pulmonary disease ([facility]) J84.9        Plan:     Patient Instructions  ILD (interstitial lung disease) ([facility])  - probably still present based on breathing test but improved - repeat Spirometry and DLCO in 7-9 months  - repeat HRCT in 7-9 months - in interim if you get worse, please call us or return sooner -  high dose flu shot 03/14/2018  - might consider bronch bal if symptoms perisist (not a good bx canddiate)   Elevated CK-MB level Positive ANA (antinuclear antibody)  - probably false positive - might be related to crestor esp CK-MB muscle enzyme -recheck ANA, DS-DNA, CK and CK-MB  Followup 7-9 months to ILD clinic     SIGNATURE    Dr. Brand Males, M.D., F.C.C.P,  Pulmonary and Critical Care Medicine Staff Physician, [facility] Director - Interstitial Lung Disease  Program  [facility] [place] at [place], [place], [address]  Pager: [phone], If no answer or between  15:00h - 7:00h: call [phone] Telephone: [phone]  4:41 PM 03/14/2018

## 2018-03-15 LAB — ANA W/REFLEX IF POSITIVE
Anti JO-1: <0.2 AI (ref 0.0–0.9)
Anti Nuclear Antibody(ANA): POSITIVE — AB
Chromatin Ab SerPl-aCnc: <0.2 AI (ref 0.0–0.9)
DSDNA AB: 18 [IU]/mL — AB (ref 0–9)
ENA SSA (RO) Ab: <0.2 AI (ref 0.0–0.9)
ENA SSB (LA) Ab: <0.2 AI (ref 0.0–0.9)
Scleroderma SCL-70: <0.2 AI (ref 0.0–0.9)

## 2018-03-15 LAB — ANTI-DNA ANTIBODY, DOUBLE-STRANDED: ds DNA Ab: 15 IU/mL — ABNORMAL HIGH

## 2018-04-12 ENCOUNTER — Other Ambulatory Visit: Payer: Self-pay | Admitting: Cardiovascular Disease

## 2018-04-14 ENCOUNTER — Other Ambulatory Visit: Payer: Self-pay | Admitting: Cardiovascular Disease

## 2018-04-18 ENCOUNTER — Ambulatory Visit (INDEPENDENT_AMBULATORY_CARE_PROVIDER_SITE_OTHER): Payer: Medicare Other | Admitting: Podiatry

## 2018-04-18 DIAGNOSIS — B351 Tinea unguium: Secondary | ICD-10-CM | POA: Diagnosis not present

## 2018-04-18 DIAGNOSIS — L84 Corns and callosities: Secondary | ICD-10-CM | POA: Diagnosis not present

## 2018-04-18 DIAGNOSIS — M79676 Pain in unspecified toe(s): Secondary | ICD-10-CM | POA: Diagnosis not present

## 2018-04-18 DIAGNOSIS — E1142 Type 2 diabetes mellitus with diabetic polyneuropathy: Secondary | ICD-10-CM | POA: Diagnosis not present

## 2018-04-18 NOTE — Patient Instructions (Signed)

## 2018-04-30 ENCOUNTER — Other Ambulatory Visit: Payer: Self-pay | Admitting: Family Medicine

## 2018-05-04 ENCOUNTER — Other Ambulatory Visit: Payer: Self-pay | Admitting: Cardiovascular Disease

## 2018-05-06 ENCOUNTER — Encounter: Payer: Self-pay | Admitting: Podiatry

## 2018-05-06 NOTE — Progress Notes (Signed)
Subjective: John [name] presents to clinic for preventative foot care.  He has history of diabetes, diabetic neuropathy and cc of painful, mycotic toenails.  Pain is aggravated when wearing enclosed shoe gear. Pain is relieved with periodic professional debridement.  He also has painful corn noted on the medial aspect of the second toe left foot and lateral aspect of the left fifth digit toenail.  Objective:  67 year old white male in no acute distress.  Alert, awake and oriented x3.  Vascular Examination: Capillary refill time <3 seconds x 10 digits Dorsalis pedis and Posterior tibial pulses present b/l No digital hair x 10 digits Skin temperature gradient within normal limits bilaterally Dermatological Examination: Skin with normal turgor, texture and tone b/l  Toenails 1-5 b/l discolored, thick, dystrophic with subungual debris and pain with palpation to nailbeds due to thickness of nails.  Hyperkeratotic lesion noted to the lateral aspect of the toenail of the left fifth digit consistent with Lister's corn formation.  He also has a hyperkeratotic lesion noted medial aspect of the left second digit.  This is not interdigital corn.  Neither the hyperkeratotic lesions show evidence of erythema, edema, drainage, or impending wound formation.  Musculoskeletal: Muscle strength 5/5 to all LE muscle groups  Neurological: Sensation diminished with 10 gram monofilament. Vibratory sensation diminished  Assessment: 1. Painful onychomycosis toenails 1-5 b/l 2. Digital corn formation of the left second digit and left fifth digit 3. NIDDM with Diabetic neuropathy  Plan: 1. Continue diabetic foot care principles.  Literature dispensed 2. Toenails 1-5 b/l were debrided in length and girth without iatrogenic bleeding. 3. Hyperkeratotic lesions were pared with sterile chisel blade to the left second digit and left fifth digit without incident.  He was given nonmedicated silicone digital pads  for protection.  He is to wear them daily when wearing his shoes and remove every evening.  These are reusable and he can wash them as needed. 4. Patient to continue soft, supportive shoe gear 5. Patient to report any pedal injuries to medical professional  6. Follow up 3 months. Patient/POA to call should there be a concern in the interim.

## 2018-05-20 ENCOUNTER — Ambulatory Visit (INDEPENDENT_AMBULATORY_CARE_PROVIDER_SITE_OTHER): Payer: Medicare Other | Admitting: Cardiovascular Disease

## 2018-05-20 ENCOUNTER — Encounter: Payer: Self-pay | Admitting: Cardiovascular Disease

## 2018-05-20 VITALS — BP 138/78 | HR 56 | Ht 72.0 in | Wt 231.0 lb

## 2018-05-20 DIAGNOSIS — Z7901 Long term (current) use of anticoagulants: Secondary | ICD-10-CM

## 2018-05-20 DIAGNOSIS — I5042 Chronic combined systolic (congestive) and diastolic (congestive) heart failure: Secondary | ICD-10-CM

## 2018-05-20 DIAGNOSIS — Z9989 Dependence on other enabling machines and devices: Secondary | ICD-10-CM | POA: Diagnosis not present

## 2018-05-20 DIAGNOSIS — E785 Hyperlipidemia, unspecified: Secondary | ICD-10-CM

## 2018-05-20 DIAGNOSIS — I2510 Atherosclerotic heart disease of native coronary artery without angina pectoris: Secondary | ICD-10-CM | POA: Diagnosis not present

## 2018-05-20 DIAGNOSIS — I10 Essential (primary) hypertension: Secondary | ICD-10-CM

## 2018-05-20 DIAGNOSIS — I483 Typical atrial flutter: Secondary | ICD-10-CM | POA: Diagnosis not present

## 2018-05-20 DIAGNOSIS — G4733 Obstructive sleep apnea (adult) (pediatric): Secondary | ICD-10-CM

## 2018-05-20 MED ORDER — EMPAGLIFLOZIN 10 MG PO TABS: 10.0000 mg | ORAL_TABLET | Freq: Every day | ORAL | 0 refills | Status: DC

## 2018-05-20 NOTE — Progress Notes (Signed)
Patient ID: John [name], male   DOB: 1951-06-04, 67 y.o.   MRN: [id number]     HPI:  John [name] is a 67 y.o. male who is a former patient of Dr. Rollene Fare. He presents for a 4 month cardiology followup evaluation.   [name] has known CAD and underwent multiple percutaneous cardiac interventions with stenting to circumflex and RCA. His last cardiac catheterization was done by [name] in September 2013 showed his RCA stented patent but he had 40% narrowing in the very distal aspect. He has a history of hypertension, as well as long-standing tobacco use with mild polycythemia. I had seen him in a sleep clinic after he obtained a new CPAP machine.  He has severe sleep apnea with an AHI on his initial diagnostic study of 44 per hour overall and 72.6 per hour with REM sleep.  A download last year  showed excellent benefit from CPAP with an AHI of 3.1.  His initial stent to his RCA was in 1996 was a Cypher PS [id number] sent. In 1999 he underwent non-DES stenting to circumflex after he presented with a posterior MI. He had additional 4-5 stents placed in his RCA.  He has a history of prior back surgery and has had a left foot drop.  He has significant obstructive sleep apnea and continues to utilize CPAP therapy.  When I saw him in the past despite CPAP therapy he complained of fatigue and residual daytime sleepiness and I suggested the possibility of Nuvigil or Provigil if necessary.    Remotely he was seen  by [name] complaints of  atypical chest pain.  There was some concern that this may be reflux and pantoprazole was discontinued and replaced with dexilant. However, it was felt that the patient may require another stress test since his last evaluation was in 2012.  He also had had recent issues with some weight gain and shortness of breath and his intermittent Lasix dose changed to 40 mg daily.  On 05/06/2015 he underwent a nuclear stress test which was interpreted as a high risk study  due to an ejection fraction of 31%.  There was a large fixed inferior and inferoseptal wall defect suggestive of scar and evidence for inferoseptal akinesis.  There was no ischemia.   On his prior nuclear study the EF in October 2012 was 38%.  An echo Doppler study done the same day however, showed discordant data and his ejection fraction was 50-55%.  Hypokinesis of the inferior inferolateral wall was noted.  There was grade 1 diastolic dysfunction.  There was mild aortic root dilatation at 41 mm.  His left atrium was severely dilated.  His right atrium was severely dilated.  There was trivial TR.  Of note, his last echo Doppler study had shown an EF of 45-50%.  When I  saw him in November 2017  he stated that he has been using CPAP.  However, for the past month he has noticed significant increased fatigue.  He was also found to have elevated sugars and his insulin was adjusted.  He admits to development of some trace ankle swelling, left greater than right.  He denied any episodes of chest pain.  He was unaware of spells of tachycardia.  He denied presyncope or syncope.  During that evaluation, he was found to be in atrial flutter with variable block at 74 bpm of questionable duration.  At that time, I recommended that he discontinue Plavix and started him on eliquis  5 mg and further titrated his Bystolic to 10 mg.  His [id number] is at least 4 and with his CAD he was advised to continue aspirin 81 mg.  He underwent an echo Doppler study on 06/02/2016 which showed an EF of 40-45% with moderate diffuse hypokinesis.  While they are, mild aortic sclerosis and increased atrial septal thickness consistent with lipomatous hypertrophy.  I obtained a download of his CPAP unit from 02/09/2016 through 05/08/2016 and he was continuing to meet Medicare compliance with 91% of days of usage and 79% with usage greater than 4 hours.  He was averaging 5 hours and 55 minutes per night.  AHI was 3.7.  However, he had a very  large leak.  I have recommended that he get a new mask from his DME company.    He underwent successful cardioversion for atrial flutter.  He is unaware of any recurrent arrhythmia.  He continues to be on amiodarone, and Bystolic.  He is on eliquis for anticoagulation.  He has had difficulty with pain in the center of his back.  He has seen [name] and had undergone an MRI and was told of having disc disease.  He has noticed some shortness of breath.  He had undergone PFTs by [name] and was not found to have significant COPD or restrictive lung disease. [name]  He continues to be on amiodarone.  He has been on [id number] mg  And bystolic for hypertension and rosuvastatin 20 mg for hyperlipidemia.  Unfortunately he is still smoking at least a half a pack per day and is smoked for over 55 years.  Continues to use CPAP with 100% compliance.  He is diabetic on insulin.  He is diabetic on insulin.  He is retired as a Chartered certified accountant for [employer]. He had experienced some back discomfort.  He denies any classic exertional chest tightness.    An echo Doppler study on 04/26/2017 which showed an EF of 40-45% with grade 1 diastolic dysfunction.  There was borderline aortic root dilatation.  PA pressure was 31 mm.  He had mild LA and mild to moderate RA .  A follow-up nuclear perfusion study on 04/27/2017 continued to be low risk and showed findings consistent with prior inferolateral myocardial infarction.  The EF on the nuclear study was less than the echo at 34%.   He has had issues with shortness of breath and has been under evaluation by [name].  Fortunately, he finally quit tobacco in February 2019.  He is felt to have interstitial lung disease. Pulmonary function studies have shown restriction with low DLCO.  He felt possibly also to have asthma based on nitric oxide testing.  With his lung disease it has been recommended that amiodarone be considered for discontinuance.  He has had issues with  cough.  His ACE inhibitor was ultimately changed by [name] and he was started on low-dose Entresto at 24/26 mm twice a day in light of his reduce LV function.   When It saw him in June 2019 due to his lung disease, I recommended discontinuance of amiodarone.  I recommended that if his heart rate increased to greater than 60 bpm should increase Bystolic to 7.5 mg from his current dose at 5 mg.  I had a long discussion with him regarding Delene Loll importance of therapy and recommended further titration to 49/51 mg twice a day.  He continues to use CPAP therapy mid to 100% compliance.  He has felt well with Entresto.  He is He is seen  by Dr. Abigail Miyamoto for pulmonary follow-up.   I last saw him in August 2019 and discussed further titration of Entresto to 97/103 mg twice a day but he preferred to stay on the 49/5100 regimen.  His blood pressure was stable.  He continue to use CPAP with 100% compliance.  Since I last saw him, he has continued to feel well.  His shortness of breath is significantly improved.  This is most likely contributed by his ultimate quitting of smoking in February 2019 as well as his discontinuance of amiodarone.  He denies PND orthopnea.  He denies recent wheezing.  However, his blood sugar has not been well controlled.  Recent hemoglobin A1c was 10.0.  He has been on insulin.  He continues to tolerate Entresto 49/51 mg twice a day in addition to Bystolic 5 mg.  He is on rosuvastatin 20 mg.  Recent laboratory has shown an LDL cholesterol excellent at 60.  He continues to be on Singulair and as needed albuterol.  He presents for reevaluation.  Past Medical History:  Diagnosis Date  . Coronary artery disease   . Diabetes mellitus   . GERD (gastroesophageal reflux disease)   . History of nuclear stress test 04/04/2011   lexiscan; mod-large in size fixed inferolateral defect (scar); non-diagnostic for ischemia; low risk scan   . Hyperlipidemia   . Hypertension   . Left foot drop      r/t past disk srugery - uses Kevlar brace  . Myocardial infarction ([place])    posterior MI  . Shortness of breath   . Sleep apnea    on CPAP; 04/28/2007 split-night - AHI during total sleep 44.43/hr and REM 72.56/hr    Past Surgical History:  Procedure Laterality Date  . BACK SURGERY  1985  . CARDIAC CATHETERIZATION  2010   6 stents total  . CARDIAC CATHETERIZATION  01/2000   percutaneous transluminal coronary balloon angioplasty of mid RCA stenotic lesion  . CARDIAC CATHETERIZATION  06/2006   no stenting; ischemic cardiomyopathy, EF 40-45%  . CARDIOVERSION N/A 07/28/2016   Procedure: CARDIOVERSION;  Surgeon: Troy Sine, MD;  Location: [facility];  Service: Cardiovascular;  Laterality: N/A;  . CORONARY ANGIOPLASTY  09/1998   mid-distal RCA balloon dilatation, 4.5 & 5.0 stents   . CORONARY ANGIOPLASTY WITH STENT PLACEMENT  03/1994   angioplasty & stenting (non-DES) of circumflex/prox ramus intermedius  . CORONARY ANGIOPLASTY WITH STENT PLACEMENT  10/1994   large iliac [id number] stent to RCA  . CORONARY ANGIOPLASTY WITH STENT PLACEMENT  12/2002   4.75m stents x2 of RCA  . CORONARY ANGIOPLASTY WITH STENT PLACEMENT  01/2005   cutting balloon arthrectomy of distal RCA & Cypher DES 3.5x13; cutting balloon arthrectomy of mid RCA with Cypher DES 3.5x18  . CORONARY ANGIOPLASTY WITH STENT PLACEMENT  11/2008   stenting of mid RCA with 4.0x167mdriver, non-DES  . LEFT HEART CATHETERIZATION WITH CORONARY ANGIOGRAM N/A 02/27/2012   Procedure: LEFT HEART CATHETERIZATION WITH CORONARY ANGIOGRAM;  Surgeon: JoLorretta HarpMD;  [facility];  Service: Cardiovascular;  Laterality: N/A;  . TRANSTHORACIC ECHOCARDIOGRAM  07/29/2010   EF 50=55%, mod inf wall hypokinesis & mild post wall hypokinesis; LA mild-mod dilated; mild mitral annular calcif & mild MR; mild TR & elevated RV systolic pressure; AV mildly sclerotic; mild aortic root dilatation     Allergies  Allergen Reactions  . Benazepril  Other (See Comments)    hyperkalemia  . Fish Allergy Itching  .  Omega-3 Fatty Acids Hives and Itching  . Fish Oil Itching and Rash    Current Outpatient Medications  Medication Sig Dispense Refill  . albuterol (PROVENTIL HFA;VENTOLIN HFA) 108 (90 Base) MCG/ACT inhaler Inhale 2 puffs into the lungs every 6 (six) hours as needed for wheezing or shortness of breath. 1 Inhaler 6  . aspirin 81 MG tablet Take 81 mg by mouth daily.    [name] BYSTOLIC 5 MG tablet TAKE 1 TABLET BY MOUTH EVERY DAY 30 tablet 5  . ELIQUIS 5 MG TABS tablet TAKE 1 TABLET BY MOUTH TWICE A DAY 60 tablet 5  . Fluticasone Furoate (ARNUITY ELLIPTA) 200 MCG/ACT AEPB Inhale 1 puff into the lungs daily. 30 each 5  . glucose blood (ONE TOUCH ULTRA TEST) test strip USE TO CHECK BLOOD SUGAR 3 TIMES A DAY (E11.9) 100 each 2  . Insulin Pen Needle (NOVOFINE) 32G X 6 MM MISC 1 each by Other route daily. 100 each 3  . LEVEMIR FLEXTOUCH 100 UNIT/ML Pen INJECT 34 UNITS INTO THE SKIN DAILY. ICD E11.65 15 pen 3  . montelukast (SINGULAIR) 10 MG tablet TAKE 1 TABLET BY MOUTH EVERY DAY 90 tablet 3  . nitroGLYCERIN (NITROSTAT) 0.4 MG SL tablet PLACE 1 TABLET (0.4 MG TOTAL) UNDER THE TONGUE EVERY 5 (FIVE) MINUTES AS NEEDED FOR CHEST PAIN. 25 tablet 1  . rosuvastatin (CRESTOR) 20 MG tablet TAKE 1 TABLET BY MOUTH EVERYDAY AT BEDTIME 30 tablet 6  . sacubitril-valsartan (ENTRESTO) 49-51 MG Take 1 tablet by mouth 2 (two) times daily. 60 tablet 5  . empagliflozin (JARDIANCE) 10 MG TABS tablet Take 10 mg by mouth daily. 14 tablet 0   No current facility-administered medications for this visit.     Social History   Socioeconomic History  . Marital status: Married    Spouse name: Not on file  . Number of children: 3  . Years of education: Not on file  . Highest education level: Not on file  Occupational History  . 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 [employer]  Social Needs  . Financial resource strain: Not on  file  . Food insecurity:    Worry: Not on file    Inability: Not on file  . Transportation needs:    Medical: Not on file    Non-medical: Not on file  Tobacco Use  . Smoking status: Former Smoker    Packs/day: 1.00    Years: 50.00    Pack years: 50.00    Types: Cigarettes    Last attempt to quit: 08/04/2017    Years since quitting: 0.7  . Smokeless tobacco: Never Used  Substance and Sexual Activity  . Alcohol use: Yes    Alcohol/week: 0.0 standard drinks    Comment: occasionally  . Drug use: No  . Sexual activity: Yes  Lifestyle  . Physical activity:    Days per week: Not on file    Minutes per session: Not on file  . Stress: Not on file  Relationships  . Social connections:    Talks on phone: Not on file    Gets together: Not on file    Attends religious service: Not on file    Active member of club or organization: Not on file    Attends meetings of clubs or organizations: Not on file    Relationship status: Not on file  . Intimate partner violence:    Fear of current or ex partner: Not  on file    Emotionally abused: Not on file    Physically abused: Not on file    Forced sexual activity: Not on file  Other Topics Concern  . Not on file  Social History Narrative  . Not on file   Social history is known that he works as a Freight forwarder for [employer]. There is a long-standing tobacco history. He rarely drinks alcohol.  Family History  Problem Relation Age of Onset  . Heart attack Father    ROS General: Negative; No fevers, chills, or night sweats; Positive for fatigue  HEENT: Positive for loss of hearing in his right ear Pulmonary: Under evaluation for possible interstitial lung disease; this of breath improved. Cardiovascular: see history of present illness Positive for possible claudication GI: Negative; No nausea, vomiting, diarrhea, or abdominal pain GU: Negative; No dysuria, hematuria, or difficulty voiding Musculoskeletal: Negative; no myalgias, joint  pain, or weakness Hematologic/Oncology: Negative; no easy bruising, bleeding Endocrine: Negative; no heat/cold intolerance; no diabetes Neuro: Negative; no changes in balance, headaches Skin: Negative; No rashes or skin lesions Psychiatric: Negative; No behavioral problems, depression Sleep: Positive for obstructive sleep apnea.  Mild residual daytime sleepiness; no bruxism, restless legs, hypnogognic hallucinations, no cataplexy Other comprehensive 14 point system review is negative.   PE BP 138/78   Pulse (!) 56   Ht 6' (1.829 m)   Wt 231 lb (104.8 kg)   BMI 31.33 kg/m    Repeat blood pressure by me was 122/70  Wt Readings from Last 3 Encounters:  05/20/18 231 lb (104.8 kg)  03/14/18 231 lb (104.8 kg)  02/14/18 229 lb (103.9 kg)   General: Alert, oriented, no distress.  Skin: normal turgor, no rashes, warm and dry HEENT: Normocephalic, atraumatic. Pupils equal round and reactive to light; sclera anicteric; extraocular muscles intact;  Nose without nasal septal hypertrophy Mouth/Parynx benign; Mallinpatti scale 3 Neck: No JVD, no carotid bruits; normal carotid upstroke Lungs: clear to ausculatation and percussion; no wheezing or rales Chest wall: without tenderness to palpitation Heart: PMI not displaced, RRR, s1 s2 normal, 1/6 systolic murmur, no diastolic murmur, no rubs, gallops, thrills, or heaves Abdomen: soft, nontender; no hepatosplenomehaly, BS+; abdominal aorta nontender and not dilated by palpation. Back: no CVA tenderness Pulses 2+ Musculoskeletal: full range of motion, normal strength, no joint deformities Extremities: no clubbing cyanosis or edema, Homan's sign negative  Neurologic: grossly nonfocal; Cranial nerves grossly wnl Psychologic: Normal mood and affect   ECG (independently read by me): Sinus bradycardia  at 56; Old inferior MI  August 2019 ECG (independently read by me): Sinus bradycardia 50.  Old inferior infarct with Q waves fairly  June 2019  ECG (independently read by me): Sinus bradycardia at 47 bpm.  Inferior Q waves.  QTc interval 472 ms.  No ectopy.  December 2018 ECG (independently read by me): Sinus bradycardia 53 bpm.  Q waves inferiorly, QTc interval 463 ms.  November 2017 ECG (independently read by me): Atrial fibrillation at 57 bpm.  Inferior Q waves.  An small inferolateral Q waves.  QTc interval 418 ms.  06/05/2016 ECG (independently read by me): Atrial flutter with a rate of 79 bpm with variable block.  QTc interval 467 ms.  November 2017 ECG (independently read by me): Atrial flutter with variable block, ventricular rate at 74.  LVH by voltage criteria in aVL.  Inferior Q waves concordant with prior MI.  May 2017 ECG (independently read by me): Sinus bradycardia at 51 bpm.  Old inferior infarct  pattern with prominent inferior lateral Q waves  05/03/2015 ECG (independently read by me): Sinus bradycardia 54 bpm with PVC.  Inferior and lateral Q waves concordant with inferior lateral MI  ECG (independently read by me): Sinus bradycardia 55 bpm.  Inferior Q waves compatible with old inferior MI.  No significant ST segment changes.  January 2015 ECG (independently read by me): Normal sinus rhythm at 75 beats per minute. Old inferior infarction with inferior Q waves. No other ST-T changes  LABS:  BMP Latest Ref Rng & Units 02/14/2018 01/22/2018 11/05/2017  Glucose 65 - 99 mg/dL 215(H) 164(H) 134(H)  BUN 8 - 27 mg/dL _0 Creatinine 0.76 - 1.27 mg/dL 1.16 1.13 1.24  BUN/Creat Ratio 10 - 24 16 NOT APPLICABLE NOT APPLICABLE  Sodium 812 - 144 mmol/L 145(H) 141 142  Potassium 3.5 - 5.2 mmol/L 5.1 5.3 4.8  Chloride 96 - 106 mmol/L 105 104 105  CO2 20 - 29 mmol/L _1 Calcium 8.6 - 10.2 mg/dL 9.1 9.2 9.2   Hepatic Function Latest Ref Rng & Units 06/20/2017 04/19/2017 05/09/2016  Total Protein 6.1 - 8.1 g/dL 6.2 6.5 6.2  Albumin 3.6 - 4.8 g/dL - 4.0 4.1  AST 10 - 35 U/L _2 ALT 9 - 46 U/L _3 Alk  Phosphatase 39 - 117 IU/L - 84 64  Total Bilirubin 0.2 - 1.2 mg/dL 0.5 0.8 1.0  Bilirubin, Direct - - - -    CBC Latest Ref Rng & Units 09/11/2017 09/11/2017 04/19/2017  WBC 4.0 - 10.5 K/uL 8.2 9.3 10.6  Hemoglobin 13.0 - 17.0 g/dL 14.1 13.8 16.6  Hematocrit 39.0 - 52.0 % 45.0 41.9 48.1  Platelets 150 - 400 K/uL 166 173 182   Lab Results  Component Value Date   MCV 92.4 09/11/2017   MCV 89.0 09/11/2017   MCV 90 04/19/2017   Lab Results  Component Value Date   TSH 1.72 09/11/2017    BNP    Component Value Date/Time   PROBNP 411 (H) 02/14/2018 1431    Lipid Panel     Component Value Date/Time   CHOL 109 02/19/2018 1005   CHOL 129 04/19/2017 1030   TRIG 77 02/19/2018 1005   HDL 33 (L) 02/19/2018 1005   HDL 35 (L) 04/19/2017 1030   CHOLHDL 3.3 02/19/2018 1005   VLDL 12 05/09/2016 0934   LDLCALC 60 02/19/2018 1005     RADIOLOGY: No results found.  IMPRESSION:  1. Coronary artery disease involving native coronary artery of native heart without angina pectoris   2. Chronic combined systolic and diastolic congestive heart failure ([place])   3. Hyperlipidemia LDL goal <70   4. OSA on CPAP   5. Essential hypertension   6. Typical atrial flutter ([place]); s/p DC cardioversion 07/28/2016   7. Anticoagulation adequate     ASSESSMENT AND PLAN: Mr. Brunsman is a 67 year-old Caucasian male who has CAD and has undergone prior extensive stenting to his RCA and also stenting to his circumflex vesse dating back to 48.  An echo Doppler study in 2013 showed ejection fraction of 45-50%. There was severe inferior, inferolateral and inferoseptal hypokinesis in the basal inferolateral wall segments consistent with scar.  His last cardiac catheterization by [name] was in September 2013. A nuclear perfusion study in November 2016 showed a large area of inferior scar without associated ischemia.  The ejection fraction on the nuclear study was reduced at 31%, making the scan,  a high-risk  study. His echo Doppler study done the same day showed discordant dated with an EF of 50%, but also with inferior hypokinesis.  His most recent echo Doppler study in November 2018 showed an EF of 40 to 45% with mild LVH, grade 1 diastolic dysfunction, and old inferior scar.  PA pressure was 31 mm.  He has done well without recurrent atrial flutter and remains off amiodarone.  He continues to be on Eliquis for anticoagulation and is not had any bleeding.  He has felt significantly improved with initiation of Entresto and blood pressure is stable on his current regimen of 49/51 mg twice a day.  We can discuss potential further titration to 97/103 mg he feels he is doing well on current therapy.  However with his well-controlled diabetes, I have recommended the addition of Jardiance to his medical regimen.  I reviewed with him most recent data with reference to cardiovascular benefit with 38% reduction in CHF as well as mortality benefit.  This will complement his Entresto with reference to survival benefit as well.  Provided him with samples of Jardiance 10 mg.  I have recommended he follow-up with [name] and most likely will require dose titration to 25 mg.  His most recent renal function was stable with a creatinine of 1.16 and GFR of 64.  Studies remain excellent on rosuvastatin 20 mg daily.  He continues to use CPAP with 100% compliance.  I again commended him on his smoking cessation.  I have recommended he undergo a follow-up echo Doppler study in 3 months to reassess LV function since initiation of Entresto as well as Jardiance.  I will see him in follow-up and further recommendations are made at that time.  Time spent: 25 minutes Troy Sine, MD, [facility]  05/21/2018 7:49 AM

## 2018-05-20 NOTE — Patient Instructions (Signed)
Medication Instructions:  START Jardiance 10 mg daily  If you need a refill on your cardiac medications before your next appointment, please call your pharmacy.   Testing/Procedures: Your physician has requested that you have an echocardiogram in 3 MONTHS. Echocardiography is a painless test that uses sound waves to create images of your heart. It provides your doctor with information about the size and shape of your heart and how well your heart's chambers and valves are working. This procedure takes approximately one hour. There are no restrictions for this procedure.  Follow-Up: At [facility], you and your health needs are our priority.  As part of our continuing mission to provide you with exceptional heart care, we have created designated Provider Care Teams.  These Care Teams include your primary Cardiologist (physician) and Advanced Practice Providers (APPs -  Physician Assistants and Nurse Practitioners) who all work together to provide you with the care you need, when you need it. You will need a follow up appointment in 3 months.  Please call our office 2 months in advance to schedule this appointment.  You may see Dr. Claiborne Billings or one of the following Advanced Practice Providers on your designated Care Team: [place], [place] . Fabian Sharp, PA-C  ---Follow up with [name] in 1 MONTH

## 2018-05-21 ENCOUNTER — Encounter: Payer: Self-pay | Admitting: Cardiovascular Disease

## 2018-05-21 ENCOUNTER — Telehealth: Payer: Self-pay | Admitting: Cardiovascular Disease

## 2018-05-21 ENCOUNTER — Other Ambulatory Visit: Payer: Self-pay | Admitting: *Deleted

## 2018-05-21 MED ORDER — EMPAGLIFLOZIN 10 MG PO TABS: 10.0000 mg | ORAL_TABLET | Freq: Every day | ORAL | 1 refills | Status: DC

## 2018-05-21 NOTE — Telephone Encounter (Signed)
No message needed °

## 2018-05-30 ENCOUNTER — Ambulatory Visit (INDEPENDENT_AMBULATORY_CARE_PROVIDER_SITE_OTHER): Payer: Medicare Other | Admitting: Family Medicine

## 2018-05-30 ENCOUNTER — Other Ambulatory Visit: Payer: Self-pay | Admitting: Family Medicine

## 2018-05-30 ENCOUNTER — Encounter: Payer: Self-pay | Admitting: Family Medicine

## 2018-05-30 VITALS — BP 112/72 | HR 73 | Temp 97.8°F | Resp 91 | Ht 72.0 in | Wt 230.5 lb

## 2018-05-30 DIAGNOSIS — B9689 Other specified bacterial agents as the cause of diseases classified elsewhere: Secondary | ICD-10-CM

## 2018-05-30 DIAGNOSIS — J209 Acute bronchitis, unspecified: Secondary | ICD-10-CM

## 2018-05-30 DIAGNOSIS — J019 Acute sinusitis, unspecified: Secondary | ICD-10-CM | POA: Diagnosis not present

## 2018-05-30 MED ORDER — IPRATROPIUM-ALBUTEROL 0.5-2.5 (3) MG/3ML IN SOLN
3.0000 mL | Freq: Once | RESPIRATORY_TRACT | Status: AC
  Administered 2018-05-30: 3 mL via RESPIRATORY_TRACT

## 2018-05-30 MED ORDER — DOXYCYCLINE HYCLATE 100 MG PO TABS: 100.0000 mg | ORAL_TABLET | Freq: Two times a day (BID) | ORAL | 0 refills | Status: AC

## 2018-05-30 MED ORDER — BENZONATATE 100 MG PO CAPS: 100.0000 mg | ORAL_CAPSULE | Freq: Three times a day (TID) | ORAL | 0 refills | Status: DC | PRN

## 2018-05-30 MED ORDER — ALBUTEROL SULFATE HFA 108 (90 BASE) MCG/ACT IN AERS: 2.0000 | INHALATION_SPRAY | Freq: Four times a day (QID) | RESPIRATORY_TRACT | 6 refills | Status: DC | PRN

## 2018-05-30 MED ORDER — PREDNISONE 20 MG PO TABS: ORAL_TABLET | ORAL | 0 refills | Status: DC

## 2018-05-30 NOTE — Progress Notes (Signed)
Patient ID: John [name], male    DOB: 1951/06/02, 67 y.o.   MRN: [id number]  PCP: [name], MD  Chief Complaint  Patient presents with  . Sinusitis    Patient has c/o nasal congestion, congestion, hoarness, runny nose. Onset last week    Subjective:   John [name] is a 67 y.o. male, presents to clinic with CC of 2 weeks of URI sx, acutely worsening for the past 3-4 days with severe sinus pain and pressure, nasal congestion and purulent and bloody nasal discharge, he also has productive cough with fatigue and generalized malaise.  He has been mostly sleeping in bed for the past couple days.  He denies CP, SOB, wheeze, night sweats, fever.  Wife is sick with similar sx.  He has not tried any OTC treatments because he was concerned with med interactions with his daily meds.    Patient Active Problem List   Diagnosis Date Noted  . Acoustic neuroma ([place]) 09/11/2017  . Asymmetrical sensorineural hearing loss 09/11/2017  . Encounter for cardioversion procedure   . Anticoagulation adequate 06/06/2016  . Fatigue 10/27/2015  . Bradycardia 10/27/2015  . Hyperlipidemia LDL goal <70 05/20/2015  . OSA on CPAP 07/23/2013  . Excessive daytime sleepiness 07/23/2013  . Hypertension   . Hyperlipidemia   . Diabetes mellitus   . Chest pain 08/18/2012  . Tobacco abuse 08/18/2012  . Unstable angina, cath [facility] 02/27/12 02/26/2012  . CAD, multiple prior RCA PCI's. Last cath 2010, Myoview low risk Oct 2012 02/26/2012  . IDDM (insulin dependent diabetes mellitus) ([place]) 02/26/2012  . HTN (hypertension) 02/26/2012  . Dyslipidemia, low HDL. Intol to fish oil and Noacin 02/26/2012  . Sleep apnea, on C-pap 02/26/2012  . COPD (chronic obstructive pulmonary disease) ([place]) 02/26/2012  . Smoking, quit one week ago 02/26/2012  . GERD (gastroesophageal reflux disease) 02/26/2012     Prior to Admission medications   Medication Sig Start Date End Date Taking? Authorizing Provider  albuterol  (PROVENTIL HFA;VENTOLIN HFA) 108 (90 Base) MCG/ACT inhaler Inhale 2 puffs into the lungs every 6 (six) hours as needed for wheezing or shortness of breath. 11/29/17  Yes Brand Males, MD  aspirin 81 MG tablet Take 81 mg by mouth daily.   Yes [provider]  BYSTOLIC 5 MG tablet TAKE 1 TABLET BY MOUTH EVERY DAY 04/15/18  Yes Troy Sine, MD  ELIQUIS 5 MG TABS tablet TAKE 1 TABLET BY MOUTH TWICE A DAY 12/17/17  Yes Troy Sine, MD  empagliflozin (JARDIANCE) 10 MG TABS tablet Take 10 mg by mouth daily. 05/21/18  Yes Troy Sine, MD  Fluticasone Furoate (ARNUITY ELLIPTA) 200 MCG/ACT AEPB Inhale 1 puff into the lungs daily. 12/12/17  Yes Ramaswamy, Belva Crome, MD  glucose blood (ONE TOUCH ULTRA TEST) test strip USE TO CHECK BLOOD SUGAR 3 TIMES A DAY (E11.9) 04/30/18  Yes [name], MD  Insulin Pen Needle (NOVOFINE) 32G X 6 MM MISC 1 each by Other route daily. 02/20/17  Yes Pickard, [name], MD  LEVEMIR FLEXTOUCH 100 UNIT/ML Pen INJECT 34 UNITS INTO THE SKIN DAILY. ICD E11.65 12/18/17  Yes [name], MD  montelukast (SINGULAIR) 10 MG tablet TAKE 1 TABLET BY MOUTH EVERY DAY 08/06/17  Yes [name], MD  nitroGLYCERIN (NITROSTAT) 0.4 MG SL tablet PLACE 1 TABLET (0.4 MG TOTAL) UNDER THE TONGUE EVERY 5 (FIVE) MINUTES AS NEEDED FOR CHEST PAIN. 05/06/18  Yes Troy Sine, MD  rosuvastatin ([place])  20 MG tablet TAKE 1 TABLET BY MOUTH EVERYDAY AT BEDTIME 04/12/18  Yes Troy Sine, MD  sacubitril-valsartan (ENTRESTO) 49-51 MG Take 1 tablet by mouth 2 (two) times daily. 11/29/17  Yes [name], MD     Allergies  Allergen Reactions  . Benazepril Other (See Comments)    hyperkalemia  . Fish Allergy Itching  . Omega-3 Fatty Acids Hives and Itching  . Fish Oil Itching and Rash     Family History  Problem Relation Age of Onset  . Heart attack Father      Social History   Socioeconomic History  . Marital status: Married    Spouse name: Not on file  .  Number of children: 3  . Years of education: Not on file  . Highest education level: Not on file  Occupational History  . 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 [place]  Social Needs  . Financial resource strain: Not on file  . Food insecurity:    Worry: Not on file    Inability: Not on file  . Transportation needs:    Medical: Not on file    Non-medical: Not on file  Tobacco Use  . Smoking status: Former Smoker    Packs/day: 1.00    Years: 50.00    Pack years: 50.00    Types: Cigarettes    Last attempt to quit: 08/04/2017    Years since quitting: 0.8  . Smokeless tobacco: Never Used  Substance and Sexual Activity  . Alcohol use: Yes    Alcohol/week: 0.0 standard drinks    Comment: occasionally  . Drug use: No  . Sexual activity: Yes  Lifestyle  . Physical activity:    Days per week: Not on file    Minutes per session: Not on file  . Stress: Not on file  Relationships  . Social connections:    Talks on phone: Not on file    Gets together: Not on file    Attends religious service: Not on file    Active member of club or organization: Not on file    Attends meetings of clubs or organizations: Not on file    Relationship status: Not on file  . Intimate partner violence:    Fear of current or ex partner: Not on file    Emotionally abused: Not on file    Physically abused: Not on file    Forced sexual activity: Not on file  Other Topics Concern  . Not on file  Social History Narrative  . Not on file     Review of Systems  Constitutional: Positive for activity change. Negative for appetite change and unexpected weight change.  HENT: Positive for congestion, postnasal drip, rhinorrhea, sinus pressure and sinus pain. Negative for ear discharge, ear pain, facial swelling, nosebleeds, sneezing and trouble swallowing.   Eyes: Negative.   Respiratory: Positive for cough, shortness of breath and wheezing.   Cardiovascular: Negative.   Negative for chest pain, palpitations and leg swelling.  Gastrointestinal: Negative.  Negative for abdominal distention, abdominal pain, blood in stool, constipation, diarrhea, nausea and vomiting.  Endocrine: Negative.   Genitourinary: Negative.   Musculoskeletal: Negative.  Negative for back pain.  Skin: Negative.  Negative for color change, pallor and rash.  Allergic/Immunologic: Negative.   Neurological: Negative.  Negative for dizziness, syncope, facial asymmetry, light-headedness and headaches.  Hematological: Negative.   Psychiatric/Behavioral: Negative.   All other systems reviewed and are  negative.      Objective:    Vitals:   05/30/18 1030  BP: 112/72  Pulse: 78  Resp: 14  Temp: 97.8 F (36.6 C)  TempSrc: Oral  SpO2: 98%  Weight: 230 lb 8 oz (104.6 kg)  Height: 6' (1.829 m)      Physical Exam Vitals signs and nursing note reviewed.  Constitutional:      General: He is not in acute distress.    Appearance: Normal appearance. He is well-developed. He is ill-appearing (mildly). He is not toxic-appearing or diaphoretic.  HENT:     Head: Normocephalic and atraumatic.     Jaw: No trismus.     Right Ear: Tympanic membrane, ear canal and external ear normal.     Left Ear: Tympanic membrane, ear canal and external ear normal.     Nose: Mucosal edema, congestion and rhinorrhea present.     Right Sinus: No maxillary sinus tenderness or frontal sinus tenderness.     Left Sinus: No maxillary sinus tenderness or frontal sinus tenderness.     Mouth/Throat:     Mouth: Mucous membranes are moist. Mucous membranes are not pale, not dry and not cyanotic.     Pharynx: Uvula midline. Posterior oropharyngeal erythema present. No oropharyngeal exudate or uvula swelling.     Tonsils: No tonsillar exudate or tonsillar abscesses.  Eyes:     General: Lids are normal. No scleral icterus.       Right eye: No discharge.        Left eye: No discharge.     Conjunctiva/sclera:  Conjunctivae normal.     Pupils: Pupils are equal, round, and reactive to light.  Neck:     Musculoskeletal: Normal range of motion and neck supple.     Trachea: Trachea and phonation normal. No tracheal deviation.  Cardiovascular:     Rate and Rhythm: Normal rate and regular rhythm.     Pulses: Normal pulses.          Radial pulses are 2+ on the right side and 2+ on the left side.     Heart sounds: Normal heart sounds. No murmur. No friction rub. No gallop.   Pulmonary:     Effort: Pulmonary effort is normal. No tachypnea, accessory muscle usage or respiratory distress.     Breath sounds: No stridor. Examination of the right-lower field reveals decreased breath sounds. Examination of the left-lower field reveals decreased breath sounds. Decreased breath sounds, wheezing (diffuse inspiratory and expiratory wheeze) and rhonchi (scattered) present. No rales.     Comments: Frequent coughing Chest:     Chest wall: No tenderness.  Abdominal:     General: Bowel sounds are normal. There is no distension.     Palpations: Abdomen is soft.     Tenderness: There is no abdominal tenderness.  Musculoskeletal: Normal range of motion.  Skin:    General: Skin is warm and dry.     Capillary Refill: Capillary refill takes less than 2 seconds.     Coloration: Skin is not pale.     Findings: No rash.     Nails: There is no clubbing.   Neurological:     Mental Status: He is alert and oriented to person, place, and time.     Motor: No abnormal muscle tone.     Coordination: Coordination normal.     Gait: Gait normal.  Psychiatric:        Speech: Speech normal.        Behavior: Behavior  normal. Behavior is cooperative.           Assessment & Plan:      ICD-10-CM   1. Acute bronchitis, unspecified organism J20.9 ipratropium-albuterol (DUONEB) 0.5-2.5 (3) MG/3ML nebulizer solution 3 mL    predniSONE (DELTASONE) 20 MG tablet    doxycycline (VIBRA-TABS) 100 MG tablet    albuterol (PROVENTIL  HFA;VENTOLIN HFA) 108 (90 Base) MCG/ACT inhaler    benzonatate (TESSALON) 100 MG capsule  2. Acute bacterial sinusitis J01.90 doxycycline (VIBRA-TABS) 100 MG tablet   B96.89     Pt with URI rapidly worsening, presentation consistent with acute bacterial sinusitis and acute bronchitis, He has PMHx significant for CHF, CAD, IDDM, COPD and has dx of ILD - more aggressive tx, steroid short taper - may need longer with lung disease, but with IDDM will do < 10 d Tx with abx x 7 day, recheck with PCP or pulm in 1-2 weeks or sooner if not improving.   Wheeze and cough in clinic did improve with duoneb - pt noted improvement with SOB/wheeze and cough.  Lungs were reexamined after neb tx - some persisting expiratory wheeze, no rales, improved BS b/l to the bases, coughing frequency improved, no respiratory distress or increased WOB, SpO2 >95%.  The pt and I discussed titration of insulin every 3 days for blood sugar changes.  He understands that his blood sugar will increase secondary to steroids.  Instructed to increase basal insulin by 3 units for elevated fasting blood sugars and to only adjust dose every 3 days - expect that his sugars will be elevated for the next week and then he should be able to return to his normal dose.   Delsa Grana, PA-C 05/30/18 11:12 AM

## 2018-06-15 ENCOUNTER — Other Ambulatory Visit: Payer: Self-pay | Admitting: Family Medicine

## 2018-06-15 ENCOUNTER — Other Ambulatory Visit: Payer: Self-pay | Admitting: Cardiovascular Disease

## 2018-06-21 ENCOUNTER — Ambulatory Visit (INDEPENDENT_AMBULATORY_CARE_PROVIDER_SITE_OTHER): Payer: Medicare Other | Admitting: Family Medicine

## 2018-06-21 ENCOUNTER — Encounter: Payer: Self-pay | Admitting: Family Medicine

## 2018-06-21 VITALS — BP 112/70 | HR 60 | Temp 98.1°F | Resp 16 | Ht 72.0 in | Wt 227.0 lb

## 2018-06-21 DIAGNOSIS — R490 Dysphonia: Secondary | ICD-10-CM

## 2018-06-21 DIAGNOSIS — E1165 Type 2 diabetes mellitus with hyperglycemia: Secondary | ICD-10-CM | POA: Diagnosis not present

## 2018-06-21 DIAGNOSIS — E785 Hyperlipidemia, unspecified: Secondary | ICD-10-CM | POA: Diagnosis not present

## 2018-06-21 DIAGNOSIS — I10 Essential (primary) hypertension: Secondary | ICD-10-CM

## 2018-06-21 MED ORDER — EMPAGLIFLOZIN 25 MG PO TABS: 25.0000 mg | ORAL_TABLET | Freq: Every day | ORAL | 5 refills | Status: DC

## 2018-06-21 MED ORDER — INSULIN DETEMIR 100 UNIT/ML FLEXPEN: 46.0000 [IU] | PEN_INJECTOR | Freq: Every day | SUBCUTANEOUS | 3 refills | Status: DC

## 2018-06-21 MED ORDER — PANTOPRAZOLE SODIUM 40 MG PO TBEC: 40.0000 mg | DELAYED_RELEASE_TABLET | Freq: Every day | ORAL | 3 refills | Status: DC

## 2018-06-21 NOTE — Progress Notes (Signed)
Subjective:    Patient ID: John [name], male    DOB: 23-Nov-1950, 68 y.o.   MRN: [id number]  HPI  Patient is here today for follow-up.  In September, his hemoglobin A1c was found to be greater than 10.  I asked the patient to come in so that we could discuss management strategies.  Patient is just now coming in to discuss.  Since the patient last saw me, he saw his cardiologist who started him on Jardiance 10 mg a day given his cardiac issues.  And congestive heart failure.  Patient also self titrated up his Levemir to 46 units a day.  Since increasing his Levemir to 46 units/day, his fasting blood sugars are between 100-130 and his 2-hour postprandial sugars are between 160 and 200.  Last 2 weeks of sugars that the patient brings in today actually look really good.  However concerningly, the patient reports approximately 2 months of hoarseness.  He has a previous history of smoking.  He denies any hemoptysis or hematemesis however the hoarseness is getting worse.  He originally attributed it to some of his cardiac medication because it began roughly around the time this started however is not getting better.  He does report heartburn on a daily basis Past Medical History:  Diagnosis Date  . Coronary artery disease   . Diabetes mellitus   . GERD (gastroesophageal reflux disease)   . History of nuclear stress test 04/04/2011   lexiscan; mod-large in size fixed inferolateral defect (scar); non-diagnostic for ischemia; low risk scan   . Hyperlipidemia   . Hypertension   . Left foot drop    r/t past disk srugery - uses Kevlar brace  . Myocardial infarction ([place])    posterior MI  . Shortness of breath   . Sleep apnea    on CPAP; 04/28/2007 split-night - AHI during total sleep 44.43/hr and REM 72.56/hr   Past Surgical History:  Procedure Laterality Date  . BACK SURGERY  1985  . CARDIAC CATHETERIZATION  2010   6 stents total  . CARDIAC CATHETERIZATION  01/2000   percutaneous transluminal  coronary balloon angioplasty of mid RCA stenotic lesion  . CARDIAC CATHETERIZATION  06/2006   no stenting; ischemic cardiomyopathy, EF 40-45%  . CARDIOVERSION N/A 07/28/2016   Procedure: CARDIOVERSION;  Surgeon: Troy Sine, MD;  Location: [facility];  Service: Cardiovascular;  Laterality: N/A;  . CORONARY ANGIOPLASTY  09/1998   mid-distal RCA balloon dilatation, 4.5 & 5.0 stents   . CORONARY ANGIOPLASTY WITH STENT PLACEMENT  03/1994   angioplasty & stenting (non-DES) of circumflex/prox ramus intermedius  . CORONARY ANGIOPLASTY WITH STENT PLACEMENT  10/1994   large iliac [id number] stent to RCA  . CORONARY ANGIOPLASTY WITH STENT PLACEMENT  12/2002   4.70mm stents x2 of RCA  . CORONARY ANGIOPLASTY WITH STENT PLACEMENT  01/2005   cutting balloon arthrectomy of distal RCA & Cypher DES 3.5x13; cutting balloon arthrectomy of mid RCA with Cypher DES 3.5x18  . CORONARY ANGIOPLASTY WITH STENT PLACEMENT  11/2008   stenting of mid RCA with 4.0x34mm driver, non-DES  . LEFT HEART CATHETERIZATION WITH CORONARY ANGIOGRAM N/A 02/27/2012   Procedure: LEFT HEART CATHETERIZATION WITH CORONARY ANGIOGRAM;  Surgeon: Lorretta Harp, MD;  Location: [facility] CATH LAB;  Service: Cardiovascular;  Laterality: N/A;  . TRANSTHORACIC ECHOCARDIOGRAM  07/29/2010   EF 50=55%, mod inf wall hypokinesis & mild post wall hypokinesis; LA mild-mod dilated; mild mitral annular calcif & mild MR; mild TR & elevated RV systolic  pressure; AV mildly sclerotic; mild aortic root dilatation    Current Outpatient Medications on File Prior to Visit  Medication Sig Dispense Refill  . albuterol (PROVENTIL HFA;VENTOLIN HFA) 108 (90 Base) MCG/ACT inhaler Inhale 2 puffs into the lungs every 6 (six) hours as needed for wheezing or shortness of breath. 1 Inhaler 6  . aspirin 81 MG tablet Take 81 mg by mouth daily.    . benzonatate (TESSALON) 100 MG capsule Take 1 capsule (100 mg total) by mouth 3 (three) times daily as needed for cough. 30 capsule 0  .  BYSTOLIC 5 MG tablet TAKE 1 TABLET BY MOUTH EVERY DAY 30 tablet 5  . ELIQUIS 5 MG TABS tablet TAKE 1 TABLET BY MOUTH TWICE A DAY 60 tablet 5  . empagliflozin (JARDIANCE) 10 MG TABS tablet Take 10 mg by mouth daily. 30 tablet 1  . ENTRESTO 49-51 MG TAKE 1 TABLET BY MOUTH TWICE A DAY 60 tablet 5  . Fluticasone Furoate (ARNUITY ELLIPTA) 200 MCG/ACT AEPB Inhale 1 puff into the lungs daily. 30 each 5  . glucose blood (ONE TOUCH ULTRA TEST) test strip USE TO CHECK BLOOD SUGAR 3 TIMES A DAY (E11.9) 100 each 2  . Insulin Pen Needle (NOVOFINE) 32G X 6 MM MISC 1 each by Other route daily. 100 each 3  . LEVEMIR FLEXTOUCH 100 UNIT/ML Pen INJECT 34 UNITS INTO THE SKIN DAILY. ICD E11.65 15 mL 3  . montelukast (SINGULAIR) 10 MG tablet TAKE 1 TABLET BY MOUTH EVERY DAY 90 tablet 3  . nitroGLYCERIN (NITROSTAT) 0.4 MG SL tablet PLACE 1 TABLET (0.4 MG TOTAL) UNDER THE TONGUE EVERY 5 (FIVE) MINUTES AS NEEDED FOR CHEST PAIN. 25 tablet 1  . predniSONE (DELTASONE) 20 MG tablet Take 3 daily for 2 days, then 2 daily for 3 days, then 1 daily for 3 days 15 tablet 0  . rosuvastatin (CRESTOR) 20 MG tablet TAKE 1 TABLET BY MOUTH EVERYDAY AT BEDTIME 30 tablet 6   No current facility-administered medications on file prior to visit.    Allergies  Allergen Reactions  . Benazepril Other (See Comments)    hyperkalemia  . Fish Allergy Itching  . Omega-3 Fatty Acids Hives and Itching  . Fish Oil Itching and Rash   Social History   Socioeconomic History  . Marital status: Married    Spouse name: Not on file  . Number of children: 3  . Years of education: Not on file  . Highest education level: Not on file  Occupational History  . 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 [place]  Social Needs  . Financial resource strain: Not on file  . Food insecurity:    Worry: Not on file    Inability: Not on file  . Transportation needs:    Medical: Not on file    Non-medical: Not on file    Tobacco Use  . Smoking status: Former Smoker    Packs/day: 1.00    Years: 50.00    Pack years: 50.00    Types: Cigarettes    Last attempt to quit: 08/04/2017    Years since quitting: 0.8  . Smokeless tobacco: Never Used  Substance and Sexual Activity  . Alcohol use: Yes    Alcohol/week: 0.0 standard drinks    Comment: occasionally  . Drug use: No  . Sexual activity: Yes  Lifestyle  . Physical activity:    Days per week: Not on file  Minutes per session: Not on file  . Stress: Not on file  Relationships  . Social connections:    Talks on phone: Not on file    Gets together: Not on file    Attends religious service: Not on file    Active member of club or organization: Not on file    Attends meetings of clubs or organizations: Not on file    Relationship status: Not on file  . Intimate partner violence:    Fear of current or ex partner: Not on file    Emotionally abused: Not on file    Physically abused: Not on file    Forced sexual activity: Not on file  Other Topics Concern  . Not on file  Social History Narrative  . Not on file      Review of Systems  All other systems reviewed and are negative.      Objective:   Physical Exam  Constitutional: He appears well-developed and well-nourished. No distress.  HENT:  Right Ear: External ear normal.  Left Ear: External ear normal.  Nose: Nose normal.  Mouth/Throat: Oropharynx is clear and moist. No oropharyngeal exudate.  Neck: Neck supple. No thyromegaly present.  Cardiovascular: Normal rate, regular rhythm and normal heart sounds.  No murmur heard. Pulmonary/Chest: Effort normal and breath sounds normal. No respiratory distress. He has no wheezes. He has no rales.  Abdominal: Soft. Bowel sounds are normal. He exhibits no distension and no mass. There is no abdominal tenderness. There is no rebound and no guarding.  Musculoskeletal:        General: No edema.  Lymphadenopathy:    He has no cervical adenopathy.   Skin: He is not diaphoretic.  Vitals reviewed.         Assessment & Plan:  Uncontrolled type 2 diabetes mellitus with hyperglycemia ([place]) - Plan: CBC with Differential/Platelet, COMPLETE METABOLIC PANEL WITH GFR, Lipid panel, Hemoglobin A1c  Hyperlipidemia LDL goal <70  Essential hypertension  Chronic hoarseness - Plan: Ambulatory referral to ENT  Given his chronic hoarseness and his history of smoking, I have recommended an ENT consultation for laryngoscopy to rule out any neoplastic process on the vocal cords.  However most likely this is due to on treated acid reflux.  Therefore I will empirically start the patient on Protonix 40 mg a day while awaiting the ENT consultation.  His most recent sugars have been well controlled since increasing his Levemir from 26 units to 46 units and the addition of Jardiance.  I will increase the Jardiance to 25 mg a day and check CBC, CMP, fasting lipid panel, and hemoglobin A1c today.

## 2018-06-22 LAB — COMPLETE METABOLIC PANEL WITH GFR
AG Ratio: 2.2 (calc) (ref 1.0–2.5)
ALT: 20 U/L (ref 9–46)
AST: 16 U/L (ref 10–35)
Albumin: 3.9 g/dL (ref 3.6–5.1)
Alkaline phosphatase (APISO): 63 U/L (ref 40–115)
BUN: 18 mg/dL (ref 7–25)
CO2: 27 mmol/L (ref 20–32)
Calcium: 9.4 mg/dL (ref 8.6–10.3)
Chloride: 106 mmol/L (ref 98–110)
Creat: 1.09 mg/dL (ref 0.70–1.25)
GFR, Est African American: 80 mL/min/{1.73_m2} (ref >=60)
GFR, Est Non African American: 69 mL/min/{1.73_m2} (ref >=60)
Globulin: 1.8 g/dL (calc) — ABNORMAL LOW (ref 1.9–3.7)
Glucose, Bld: 132 mg/dL — ABNORMAL HIGH (ref 65–99)
Potassium: 4.8 mmol/L (ref 3.5–5.3)
Sodium: 142 mmol/L (ref 135–146)
Total Bilirubin: 0.6 mg/dL (ref 0.2–1.2)
Total Protein: 5.7 g/dL — ABNORMAL LOW (ref 6.1–8.1)

## 2018-06-22 LAB — HEMOGLOBIN A1C
Hgb A1c MFr Bld: 9.6 % of total Hgb — ABNORMAL HIGH (ref <5.7)
Mean Plasma Glucose: 229 (calc)
eAG (mmol/L): 12.7 (calc)

## 2018-06-22 LAB — CBC WITH DIFFERENTIAL/PLATELET
Absolute Monocytes: 340 cells/uL (ref 200–950)
Basophils Absolute: 69 cells/uL (ref 0–200)
Basophils Relative: 1.1 %
Eosinophils Absolute: 183 cells/uL (ref 15–500)
Eosinophils Relative: 2.9 %
HCT: 49.6 % (ref 38.5–50.0)
Hemoglobin: 16.7 g/dL (ref 13.2–17.1)
Lymphs Abs: 1884 cells/uL (ref 850–3900)
MCH: 30.3 pg (ref 27.0–33.0)
MCHC: 33.7 g/dL (ref 32.0–36.0)
MCV: 89.9 fL (ref 80.0–100.0)
MPV: 11 fL (ref 7.5–12.5)
Monocytes Relative: 5.4 %
Neutro Abs: 3824 cells/uL (ref 1500–7800)
Neutrophils Relative %: 60.7 %
PLATELETS: 204 10*3/uL (ref 140–400)
RBC: 5.52 10*6/uL (ref 4.20–5.80)
RDW: 12.3 % (ref 11.0–15.0)
TOTAL LYMPHOCYTE: 29.9 %
WBC: 6.3 10*3/uL (ref 3.8–10.8)

## 2018-06-22 LAB — LIPID PANEL
Cholesterol: 124 mg/dL (ref <200)
HDL: 34 mg/dL — ABNORMAL LOW (ref >40)
LDL Cholesterol (Calc): 72 mg/dL (calc)
Non-HDL Cholesterol (Calc): 90 mg/dL (calc) (ref <130)
TRIGLYCERIDES: 93 mg/dL (ref <150)
Total CHOL/HDL Ratio: 3.6 (calc) (ref <5.0)

## 2018-07-01 DIAGNOSIS — D333 Benign neoplasm of cranial nerves: Secondary | ICD-10-CM | POA: Diagnosis not present

## 2018-07-01 DIAGNOSIS — H918X1 Other specified hearing loss, right ear: Secondary | ICD-10-CM | POA: Diagnosis not present

## 2018-07-01 DIAGNOSIS — H90A21 Sensorineural hearing loss, unilateral, right ear, with restricted hearing on the contralateral side: Secondary | ICD-10-CM | POA: Diagnosis not present

## 2018-07-08 ENCOUNTER — Telehealth: Payer: Self-pay | Admitting: Internal Medicine

## 2018-07-08 NOTE — Telephone Encounter (Signed)
Could you please let ATILLA ZOLLNER know  1. Sorry for signioficant delay in reporting autoimmune results from sept 2019  2. Some were trace positive - like before. Not sure if they are clinically meaningful. Probably not. Question: is he known to have autoimmune disease?  Thanks  MR

## 2018-07-08 NOTE — Telephone Encounter (Signed)
Attempted to call pt but unable to reach. Left message for pt to return call. 

## 2018-07-16 ENCOUNTER — Ambulatory Visit (INDEPENDENT_AMBULATORY_CARE_PROVIDER_SITE_OTHER): Payer: Medicare Other

## 2018-07-16 ENCOUNTER — Ambulatory Visit (INDEPENDENT_AMBULATORY_CARE_PROVIDER_SITE_OTHER): Payer: Medicare Other | Admitting: Podiatry

## 2018-07-16 ENCOUNTER — Encounter: Payer: Self-pay | Admitting: Podiatry

## 2018-07-16 DIAGNOSIS — E1142 Type 2 diabetes mellitus with diabetic polyneuropathy: Secondary | ICD-10-CM

## 2018-07-16 DIAGNOSIS — M79676 Pain in unspecified toe(s): Secondary | ICD-10-CM

## 2018-07-16 DIAGNOSIS — S90851A Superficial foreign body, right foot, initial encounter: Secondary | ICD-10-CM

## 2018-07-16 DIAGNOSIS — B351 Tinea unguium: Secondary | ICD-10-CM

## 2018-07-16 DIAGNOSIS — L84 Corns and callosities: Secondary | ICD-10-CM | POA: Diagnosis not present

## 2018-07-16 NOTE — Progress Notes (Signed)
Subjective: John [name] presents to clinic today for diabetic preventative foot care.  He has history of diabetes and diabetic neuropathy.  He states he is starting to have pain on the plantar aspect of his left foot.  He denies any trauma to the area.  He relates the pain presents after he has been on his feet and is a sharp shooting pain.  He states it goes away within a couple of minutes.  He does not have this pain when weightbearing.   Patient states that he does walk around the house wearing only socks.   John Frizzle, MD is his primary care physician.  His last visit was June 21, 2018.   Current Outpatient Medications:  .  albuterol (PROVENTIL HFA;VENTOLIN HFA) 108 (90 Base) MCG/ACT inhaler, Inhale 2 puffs into the lungs every 6 (six) hours as needed for wheezing or shortness of breath., Disp: 1 Inhaler, Rfl: 6 .  aspirin 81 MG tablet, Take 81 mg by mouth daily., Disp: , Rfl:  .  BYSTOLIC 5 MG tablet, TAKE 1 TABLET BY MOUTH EVERY DAY, Disp: 30 tablet, Rfl: 5 .  empagliflozin (JARDIANCE) 25 MG TABS tablet, Take 25 mg by mouth daily., Disp: 30 tablet, Rfl: 5 .  ENTRESTO 49-51 MG, TAKE 1 TABLET BY MOUTH TWICE A DAY, Disp: 60 tablet, Rfl: 5 .  Fluticasone Furoate (ARNUITY ELLIPTA) 200 MCG/ACT AEPB, Inhale 1 puff into the lungs daily., Disp: 30 each, Rfl: 5 .  glucose blood (ONE TOUCH ULTRA TEST) test strip, USE TO CHECK BLOOD SUGAR 3 TIMES A DAY (E11.9), Disp: 100 each, Rfl: 2 .  Insulin Detemir (LEVEMIR FLEXTOUCH) 100 UNIT/ML Pen, Inject 46 Units into the skin daily., Disp: 15 mL, Rfl: 3 .  Insulin Pen Needle (NOVOFINE) 32G X 6 MM MISC, 1 each by Other route daily., Disp: 100 each, Rfl: 3 .  montelukast (SINGULAIR) 10 MG tablet, TAKE 1 TABLET BY MOUTH EVERY DAY, Disp: 90 tablet, Rfl: 3 .  nitroGLYCERIN (NITROSTAT) 0.4 MG SL tablet, PLACE 1 TABLET (0.4 MG TOTAL) UNDER THE TONGUE EVERY 5 (FIVE) MINUTES AS NEEDED FOR CHEST PAIN., Disp: 25 tablet, Rfl: 1 .  pantoprazole (PROTONIX) 40  MG tablet, Take 1 tablet (40 mg total) by mouth daily., Disp: 30 tablet, Rfl: 3 .  rosuvastatin (CRESTOR) 20 MG tablet, TAKE 1 TABLET BY MOUTH EVERYDAY AT BEDTIME, Disp: 30 tablet, Rfl: 6  Allergies  Allergen Reactions  . Benazepril Other (See Comments)    hyperkalemia  . Fish Allergy Itching  . Omega-3 Fatty Acids Hives and Itching  . Fish Oil Itching and Rash    Vascular Examination: Capillary refill time <3 seconds x 10 digits Dorsalis pedis and Posterior tibial pulses present b/l No digital hair x 10 digits Skin temperature warm to cool b/l There is no edema noted.  Dermatological Examination: Skin with normal turgor, texture and tone b/l  Toenails 1-5 b/l discolored, thick, dystrophic with subungual debris and pain with palpation to nailbeds due to thickness of nails.  Hyperkeratotic lesion submetatarsal head 2 and 3 of the left foot resembling a ball tyloma.  There is no surrounding erythema, no edema, no drainage, no flocculence.  There is no warmth.  He has interdigital hyperkeratotic lesion noted on the medial aspect of the left second digit at the proximal interphalangeal joint.  There is no edema, no erythema, no drainage, no flocculence.  The corn is better in appearance today since he has been using his silicone toe pad.  On  the right great toe is noted to be an old injury.  It appears to be somewhat of a healed lacerated area.  Patient does not relate stepping on anything.  He does however walk unprotected in his home wearing only socks.  Musculoskeletal: Muscle strength 5/5 to all LE muscle groups  He does have a contracted rigid hammertoe of the left second digit.  There is blanchable erythema noted at the proximal interphalangeal joint.  There is no blistering nor hyperkeratotic lesion present.  Hallux abductovalgus with bunion deformity bilaterally  Neurological: Sensation diminished with 10 gram monofilament. Vibratory sensation diminished  X-ray of the  right foot revealed no foreign body present near the great toe.  Assessment: 1. Painful onychomycosis toenails 1-5 b/l 2. Callus submetatarsal heads 2, 3 left foot 3. Interdigital corn left second digit 4. Hallux abductovalgus with bunion deformity bilaterally 5. Hammertoe left second digit 6. NIDDM with Diabetic neuropathy  Plan: 1. Continue diabetic foot care principles.  Discussed the importance of not walking barefoot indoors or outdoors with patient.  He was not aware he had an injury to his right great toe and I stressed that it is very important for him to wear house slippers when he is in the home.  He states he will buy a new pair on today. 2. X-ray of the right foot was performed to rule out foreign body due to patient having injury resembling laceration on right second toe.  No foreign body was discovered on x-ray today. 3. Toenails 1-5 b/l were debrided in length and girth without iatrogenic bleeding. 4. Hyperkeratotic lesion pared with sterile chisel blade submetatarsal heads 2, 3 left foot and medial aspect left second digit.   7. Patient to continue soft, supportive shoe gear. Due to formation of new callus on the plantar aspect of the left foot I recommend he start wearing diabetic shoe gear to prevent breakdown in this area.  He is in agreement.  We will start the diabetic shoe process we will send the appropriate paperwork to his PCP who is Dr. Jenna [name].  Supporting diagnoses for his diabetic shoes are hallux abductovalgus with bunion deformity bilaterally, rigid hammertoe deformity left second digit, calluses submetatarsal heads 2 and 3 left foot, interdigital corn left second digit. 5. Patient to report any pedal injuries to medical professional  6. Follow up 3 months.  7. Patient/POA to call should there be a concern in the interim.

## 2018-07-16 NOTE — Telephone Encounter (Signed)
ATC pt, no answer. Left message for pt to call back.  I wanted to make sure this was addressed with pt.

## 2018-07-16 NOTE — Patient Instructions (Signed)
Diabetes Mellitus and Foot Care Foot care is an important part of your health, especially when you have diabetes. Diabetes may cause you to have problems because of poor blood flow (circulation) to your feet and legs, which can cause your skin to:  Become thinner and drier.  Break more easily.  Heal more slowly.  Peel and crack. You may also have nerve damage (neuropathy) in your legs and feet, causing decreased feeling in them. This means that you may not notice minor injuries to your feet that could lead to more serious problems. Noticing and addressing any potential problems early is the best way to prevent future foot problems. How to care for your feet Foot hygiene  Wash your feet daily with warm water and mild soap. Do not use hot water. Then, pat your feet and the areas between your toes until they are completely dry. Do not soak your feet as this can dry your skin.  Trim your toenails straight across. Do not dig under them or around the cuticle. File the edges of your nails with an emery board or nail file.  Apply a moisturizing lotion or petroleum jelly to the skin on your feet and to dry, brittle toenails. Use lotion that does not contain alcohol and is unscented. Do not apply lotion between your toes. Shoes and socks  Wear clean socks or stockings every day. Make sure they are not too tight. Do not wear knee-high stockings since they may decrease blood flow to your legs.  Wear shoes that fit properly and have enough cushioning. Always look in your shoes before you put them on to be sure there are no objects inside.  To break in new shoes, wear them for just a few hours a day. This prevents injuries on your feet. Wounds, scrapes, corns, and calluses  Check your feet daily for blisters, cuts, bruises, sores, and redness. If you cannot see the bottom of your feet, use a mirror or ask someone for help.  Do not cut corns or calluses or try to remove them with medicine.  If you  find a minor scrape, cut, or break in the skin on your feet, keep it and the skin around it clean and dry. You may clean these areas with mild soap and water. Do not clean the area with peroxide, alcohol, or iodine.  If you have a wound, scrape, corn, or callus on your foot, look at it several times a day to make sure it is healing and not infected. Check for: ? Redness, swelling, or pain. ? Fluid or blood. ? Warmth. ? Pus or a bad smell. General instructions  Do not cross your legs. This may decrease blood flow to your feet.  Do not use heating pads or hot water bottles on your feet. They may burn your skin. If you have lost feeling in your feet or legs, you may not know this is happening until it is too late.  Protect your feet from hot and cold by wearing shoes, such as at the beach or on hot pavement.  Schedule a complete foot exam at least once a year (annually) or more often if you have foot problems. If you have foot problems, report any cuts, sores, or bruises to your health care provider immediately. Contact a health care provider if:  You have a medical condition that increases your risk of infection and you have any cuts, sores, or bruises on your feet.  You have an injury that is not   healing.  You have redness on your legs or feet.  You feel burning or tingling in your legs or feet.  You have pain or cramps in your legs and feet.  Your legs or feet are numb.  Your feet always feel cold.  You have pain around a toenail. Get help right away if:  You have a wound, scrape, corn, or callus on your foot and: ? You have pain, swelling, or redness that gets worse. ? You have fluid or blood coming from the wound, scrape, corn, or callus. ? Your wound, scrape, corn, or callus feels warm to the touch. ? You have pus or a bad smell coming from the wound, scrape, corn, or callus. ? You have a fever. ? You have a red line going up your leg. Summary  Check your feet every day  for cuts, sores, red spots, swelling, and blisters.  Moisturize feet and legs daily.  Wear shoes that fit properly and have enough cushioning.  If you have foot problems, report any cuts, sores, or bruises to your health care provider immediately.  Schedule a complete foot exam at least once a year (annually) or more often if you have foot problems. This information is not intended to replace advice given to you by your health care provider. Make sure you discuss any questions you have with your health care provider. Document Released: 06/02/2000 Document Revised: 07/18/2017 Document Reviewed: 07/07/2016 Elsevier Interactive Patient Education  2019 [employer]  Onychomycosis/Fungal Toenails  WHAT IS IT? An infection that lies within the keratin of your nail plate that is caused by a fungus.  WHY ME? Fungal infections affect all ages, sexes, races, and creeds.  There may be many factors that predispose you to a fungal infection such as age, coexisting medical conditions such as diabetes, or an autoimmune disease; stress, medications, fatigue, genetics, etc.  Bottom line: fungus thrives in a warm, moist environment and your shoes offer such a location.  IS IT CONTAGIOUS? Theoretically, yes.  You do not want to share shoes, nail clippers or files with someone who has fungal toenails.  Walking around barefoot in the same room or sleeping in the same bed is unlikely to transfer the organism.  It is important to realize, however, that fungus can spread easily from one nail to the next on the same foot.  HOW DO WE TREAT THIS?  There are several ways to treat this condition.  Treatment may depend on many factors such as age, medications, pregnancy, liver and kidney conditions, etc.  It is best to ask your doctor which options are available to you.  1. No treatment.   Unlike many other medical concerns, you can live with this condition.  However for many people this can be a painful condition and  may lead to ingrown toenails or a bacterial infection.  It is recommended that you keep the nails cut short to help reduce the amount of fungal nail. 2. Topical treatment.  These range from herbal remedies to prescription strength nail lacquers.  About 40-50% effective, topicals require twice daily application for approximately 9 to 12 months or until an entirely new nail has grown out.  The most effective topicals are medical grade medications available through physicians offices. 3. Oral antifungal medications.  With an 80-90% cure rate, the most common oral medication requires 3 to 4 months of therapy and stays in your system for a year as the new nail grows out.  Oral antifungal medications do require   blood work to make sure it is a safe drug for you.  A liver function panel will be performed prior to starting the medication and after the first month of treatment.  It is important to have the blood work performed to avoid any harmful side effects.  In general, this medication safe but blood work is required. 4. Laser Therapy.  This treatment is performed by applying a specialized laser to the affected nail plate.  This therapy is noninvasive, fast, and non-painful.  It is not covered by insurance and is therefore, out of pocket.  The results have been very good with a 80-95% cure rate.  [facility] is the only practice in the area to offer this therapy. 5. Permanent Nail Avulsion.  Removing the entire nail so that a new nail will not grow back. 

## 2018-07-18 ENCOUNTER — Ambulatory Visit: Payer: Medicare Other | Admitting: Podiatry

## 2018-07-19 NOTE — Telephone Encounter (Signed)
Attempted to call pt but unable to reach. Left message for pt to return call. 

## 2018-07-22 ENCOUNTER — Ambulatory Visit (INDEPENDENT_AMBULATORY_CARE_PROVIDER_SITE_OTHER): Payer: Medicare Other | Admitting: Otolaryngology

## 2018-07-22 DIAGNOSIS — R490 Dysphonia: Secondary | ICD-10-CM | POA: Diagnosis not present

## 2018-07-22 NOTE — Telephone Encounter (Signed)
Patient returned phone call.  Patient phone number is [phone].

## 2018-07-22 NOTE — Telephone Encounter (Signed)
Pt is aware of results and voiced his understanding.  Pt stated that he does not have a known autoimmune disease.  Routing to MR to make aware.

## 2018-07-24 ENCOUNTER — Ambulatory Visit: Payer: Medicare Other | Admitting: Orthotics

## 2018-07-24 DIAGNOSIS — Q828 Other specified congenital malformations of skin: Secondary | ICD-10-CM

## 2018-07-24 DIAGNOSIS — L84 Corns and callosities: Secondary | ICD-10-CM

## 2018-07-24 DIAGNOSIS — E1142 Type 2 diabetes mellitus with diabetic polyneuropathy: Secondary | ICD-10-CM

## 2018-07-24 DIAGNOSIS — D333 Benign neoplasm of cranial nerves: Secondary | ICD-10-CM

## 2018-07-30 NOTE — Telephone Encounter (Signed)
Unable to schedule rov that far out.  Recall placed in pt's chart.  Nothing further needed at this time- will close encounter.

## 2018-07-30 NOTE — Telephone Encounter (Signed)
Please ensure fu into [facility] clinic 9 months from sept 2019 visit with HRCT/spiro/dlco like outlined in that note

## 2018-08-05 ENCOUNTER — Other Ambulatory Visit: Payer: Self-pay | Admitting: Podiatrist

## 2018-08-05 ENCOUNTER — Ambulatory Visit (INDEPENDENT_AMBULATORY_CARE_PROVIDER_SITE_OTHER): Payer: Medicare Other | Admitting: Podiatrist

## 2018-08-05 ENCOUNTER — Ambulatory Visit (INDEPENDENT_AMBULATORY_CARE_PROVIDER_SITE_OTHER): Payer: Medicare Other

## 2018-08-05 DIAGNOSIS — S92352A Displaced fracture of fifth metatarsal bone, left foot, initial encounter for closed fracture: Secondary | ICD-10-CM

## 2018-08-05 DIAGNOSIS — M79672 Pain in left foot: Secondary | ICD-10-CM

## 2018-08-05 NOTE — Patient Instructions (Signed)
Metatarsal Fracture A metatarsal fracture is a break in one of the five bones that connect the toes to the rest of the foot. This may also be called a forefoot fracture. A metatarsal fracture may be:  A crack in the surface of the bone (stress fracture). This often occurs in athletes.  A break all the way through the bone (complete fracture). The bone that connects to the little toe (fifth metatarsal) is most commonly fractured. Ballet dancers often fracture this bone. What are the causes? A metatarsal fracture may be caused by:  Sudden twisting of the foot.  Falling onto the foot.  Something heavy falling onto the foot.  Overuse or repetitive exercise. What increases the risk? This condition is more likely to develop in people who:  Play contact sports.  Do ballet.  Have a condition that causes the bones to become thin and brittle (osteoporosis).  Have a low calcium level. What are the signs or symptoms? Symptoms of this condition include:  Pain that gets worse when walking or standing.  Pain when pressing on the foot or moving the toes.  Swelling.  Bruising on the top or bottom of the foot. How is this diagnosed? This condition may be diagnosed based on:  Your symptoms.  Any recent foot injuries you have had.  A physical exam.  An X-ray of your foot. If you have a stress fracture, it may not show up on an X-ray, and you may need other imaging tests, such as: ? A bone scan. ? CT scan. ? MRI. How is this treated? Treatment depends on how severe your fracture is and how the pieces of the broken bone line up with each other (alignment). Treatment may involve:  Wearing a cast, splint, or supportive boot on your foot.  Using crutches, and not putting any weight on your foot.  Having surgery to align broken bones (open reduction and internal fixation, ORIF).  Physical therapy.  Follow-up visits and X-rays to make sure you are healing. Follow these instructions  at home: If you have a splint or a supportive boot:  Wear the splint or boot as told by your health care provider. Remove it only as told by your health care provider.  Loosen the splint or boot if your toes tingle, become numb, or turn cold and blue.  Keep the splint or boot clean.  If your splint or boot is not waterproof: ? Do not let it get wet. ? Cover it with a watertight covering when you take a bath or a shower. If you have a cast:  Do not stick anything inside the cast to scratch your skin. Doing that increases your risk for infection.  Check the skin around the cast every day. Tell your health care provider about any concerns.  You may put lotion on dry skin around the edges of the cast. Do not put lotion on the skin underneath the cast.  Keep the cast clean.  If the cast is not waterproof: ? Do not let it get wet. ? Cover it with a watertight covering when you take a bath or a shower. Activity  Do not use your affected leg to support your body weight until your health care provider says that you can. Use crutches as directed.  Ask your health care provider what activities are safe for you during recovery, and ask what activities you need to avoid.  Do physical therapy exercises as directed. Driving  Do not drive or use heavy machinery  while taking pain medicine.  Do not drive while wearing a cast, splint, or boot on a foot that you use for driving. Managing pain, stiffness, and swelling   If directed, put ice on painful areas: ? Put ice in a plastic bag. ? Place a towel between your skin and the bag.  If you have a removable splint or boot, remove it as told by your health care provider.  If you have a cast, place a towel between your cast and the bag. ? Leave the ice on for 20 minutes, 2-3 times a day.  Move your toes often to avoid stiffness and to lessen swelling.  Raise (elevate) your lower leg above the level of your heart while you are sitting or  lying down. General instructions  Do not put pressure on any part of the cast or splint until it is fully hardened. This may take several hours.  Take over-the-counter and prescription medicines only as told by your health care provider.  Do not use any products that contain nicotine or tobacco, such as cigarettes and e-cigarettes. These can delay bone healing. If you need help quitting, ask your health care provider.  Do not take baths, swim, or use a hot tub until your health care provider approves. Ask your health care provider if you may take showers.  Keep all follow-up visits as told by your health care provider. This is important. Contact a health care provider if you have:  Pain that gets worse or does not get better with medicine.  A fever.  A bad smell coming from your cast or splint. Get help right away if you have:  Any of the following in your toes or your foot, even after loosening your splint (if applicable): ? Numbness. ? Tingling. ? Coldness. ? Blue skin.  Redness or swelling that gets worse.  Pain that suddenly becomes severe. Summary  A metatarsal fracture is a break in one of the five bones that connect the toes to the rest of the foot.  Treatment depends on how severe your fracture is and how the pieces of the broken bone line up with each other (alignment). This may include wearing a cast, splint, or supportive boot, or using crutches. Sometimes surgery is needed to align the bones.  Ice and elevate your foot to help lessen the pain and swelling.  Make sure you know what symptoms should cause you to get help right away. This information is not intended to replace advice given to you by your health care provider. Make sure you discuss any questions you have with your health care provider. Document Released: 02/25/2002 Document Revised: 07/02/2017 Document Reviewed: 07/02/2017 Elsevier Interactive Patient Education  2019 [employer].

## 2018-08-06 NOTE — Progress Notes (Signed)
  Chief Complaint  Patient presents with  . Foot Injury    Pt states got out of car 2 days ago and had pain so severe he couldn't walk. Pain is located lateral side left foot and is tender to touch, sharp pain worse when standing.     HPI: Patient is 68 y.o. male who presents today for lateral left foot pain.  He States the pain was significant and started when he got out of his car funny.  Relates his foot is swollen and he cannot walk on the foot.  Review of Systems  DATA OBTAINED: from patient  GENERAL: Feels well no fevers, no fatigue, no changes in appetite SKIN: No itching, no rashes, no open wounds EYES: No eye pain,no redness, no discharge EARS: No earache,no ringing of ears, NOSE: No congestion, no drainage, no bleeding  MOUTH/THROAT: No mouth pain, No sore throat, No difficulty chewing or swallowing  RESPIRATORY: No cough, no wheezing, no SOB CARDIAC: No chest pain,no heart palpitations, GI: No abdominal pain, No Nausea, no vomiting, no diarrhea, no heartburn or no reflux  GU: No dysuria, no increased frequency or urgency MUSCULOSKELETAL: No unrelieved bone/joint pain,  NEUROLOGIC: Awake, alert, appropriate to situation, No change in mental status. PSYCHIATRIC: No overt anxiety or sadness.No behavior issue.      Physical Exam  GENERAL APPEARANCE: Alert, conversant. Appropriately groomed. No acute distress.  VASCULAR: Pedal pulses palpable DP and PT bilateral.  Capillary refill time is immediate to all digits,  Proximal to distal cooling it warm to warm.  Digital hair growth is absent bilateral  NEUROLOGIC: sensation is intact epicritically and protectively to 5.07 monofilament at 2/5 sites bilateral.  Light touch is intact bilateral, vibratory sensation diminshed bilateral MUSCULOSKELETAL: Swelling of the left foot is noted in comparison with the right.  Pinpoint pain at the fifth metatarsal base is noted. DERMATOLOGIC: Swelling of the left foot in general is noted.  No  discrete bruising is seen.  Right foot skin color, texture, and turger are within normal limits.   X-rays are obtained of the left  foot 3 views taken.  Fracture of the fifth metatarsal base is seen consistent with an avulsion fracture.  Fracture is in good alignment and position.  Appears to be a fresh fracture.  Assessment   Avulsion fracture fifth metatarsal base left foot  Plan  Recommended a air fracture walker and use of crutches.  Air fracture walker was dispensed at today's visit.  He will obtain crutches and keep weight off the left foot.  I will see him back for re-x-ray and recheck in 3 to 4 weeks.

## 2018-08-13 ENCOUNTER — Other Ambulatory Visit: Payer: Self-pay | Admitting: Internal Medicine

## 2018-08-19 ENCOUNTER — Ambulatory Visit (HOSPITAL_COMMUNITY): Payer: Medicare Other | Attending: Internal Medicine

## 2018-08-19 DIAGNOSIS — I2510 Atherosclerotic heart disease of native coronary artery without angina pectoris: Secondary | ICD-10-CM | POA: Diagnosis not present

## 2018-08-19 DIAGNOSIS — I5042 Chronic combined systolic (congestive) and diastolic (congestive) heart failure: Secondary | ICD-10-CM

## 2018-08-20 DIAGNOSIS — H26493 Other secondary cataract, bilateral: Secondary | ICD-10-CM | POA: Diagnosis not present

## 2018-08-20 DIAGNOSIS — Z794 Long term (current) use of insulin: Secondary | ICD-10-CM | POA: Diagnosis not present

## 2018-08-20 DIAGNOSIS — H11153 Pinguecula, bilateral: Secondary | ICD-10-CM | POA: Diagnosis not present

## 2018-08-20 DIAGNOSIS — Z961 Presence of intraocular lens: Secondary | ICD-10-CM | POA: Diagnosis not present

## 2018-08-20 DIAGNOSIS — D3132 Benign neoplasm of left choroid: Secondary | ICD-10-CM | POA: Diagnosis not present

## 2018-08-20 DIAGNOSIS — H33322 Round hole, left eye: Secondary | ICD-10-CM | POA: Diagnosis not present

## 2018-08-20 DIAGNOSIS — E119 Type 2 diabetes mellitus without complications: Secondary | ICD-10-CM | POA: Diagnosis not present

## 2018-08-20 DIAGNOSIS — H35033 Hypertensive retinopathy, bilateral: Secondary | ICD-10-CM | POA: Diagnosis not present

## 2018-08-20 DIAGNOSIS — H35373 Puckering of macula, bilateral: Secondary | ICD-10-CM | POA: Diagnosis not present

## 2018-08-20 DIAGNOSIS — H1849 Other corneal degeneration: Secondary | ICD-10-CM | POA: Diagnosis not present

## 2018-08-20 LAB — HM DIABETES EYE EXAM

## 2018-08-29 ENCOUNTER — Ambulatory Visit (INDEPENDENT_AMBULATORY_CARE_PROVIDER_SITE_OTHER): Payer: Medicare Other | Admitting: Cardiovascular Disease

## 2018-08-29 ENCOUNTER — Other Ambulatory Visit: Payer: Self-pay

## 2018-08-29 ENCOUNTER — Encounter: Payer: Self-pay | Admitting: Cardiovascular Disease

## 2018-08-29 VITALS — BP 122/76 | HR 63 | Ht 72.0 in | Wt 226.8 lb

## 2018-08-29 DIAGNOSIS — Z7901 Long term (current) use of anticoagulants: Secondary | ICD-10-CM

## 2018-08-29 DIAGNOSIS — I2510 Atherosclerotic heart disease of native coronary artery without angina pectoris: Secondary | ICD-10-CM

## 2018-08-29 DIAGNOSIS — I10 Essential (primary) hypertension: Secondary | ICD-10-CM

## 2018-08-29 DIAGNOSIS — E785 Hyperlipidemia, unspecified: Secondary | ICD-10-CM | POA: Diagnosis not present

## 2018-08-29 DIAGNOSIS — Z9989 Dependence on other enabling machines and devices: Secondary | ICD-10-CM | POA: Diagnosis not present

## 2018-08-29 DIAGNOSIS — I483 Typical atrial flutter: Secondary | ICD-10-CM | POA: Diagnosis not present

## 2018-08-29 DIAGNOSIS — I5042 Chronic combined systolic (congestive) and diastolic (congestive) heart failure: Secondary | ICD-10-CM

## 2018-08-29 DIAGNOSIS — G4733 Obstructive sleep apnea (adult) (pediatric): Secondary | ICD-10-CM | POA: Diagnosis not present

## 2018-08-29 NOTE — Patient Instructions (Signed)

## 2018-08-30 NOTE — Progress Notes (Signed)
Patient ID: John [name], male   DOB: [birth year], 68 y.o.   MRN: [id number]     HPI:  John [name] is a 68 y.o. male who is a former patient of Dr. Rollene Fare. He presents for a 4 month cardiology followup evaluation.   John [name] has known CAD and underwent multiple percutaneous cardiac interventions with stenting to circumflex and RCA. His last cardiac catheterization was done by [name] in September 2013 showed his RCA stented patent but he had 40% narrowing in the very distal aspect. He has a history of hypertension, as well as long-standing tobacco use with mild polycythemia. I had seen him in a sleep clinic after he obtained a new CPAP machine.  He has severe sleep apnea with an AHI on his initial diagnostic study of 44 per hour overall and 72.6 per hour with REM sleep.  A download last year  showed excellent benefit from CPAP with an AHI of 3.1.  His initial stent to his RCA was in 1996 was a Cypher PS [id number] sent. In 1999 he underwent non-DES stenting to circumflex after he presented with a posterior MI. He had additional 4-5 stents placed in his RCA.  He has a history of prior back surgery and has had a left foot drop.  He has significant obstructive sleep apnea and continues to utilize CPAP therapy.  When I saw him in the past despite CPAP therapy he complained of fatigue and residual daytime sleepiness and I suggested the possibility of Nuvigil or Provigil if necessary.    Remotely he was seen  by [name] complaints of  atypical chest pain.  There was some concern that this may be reflux and pantoprazole was discontinued and replaced with dexilant. However, it was felt that the patient may require another stress test since his last evaluation was in 2012.  He also had had recent issues with some weight gain and shortness of breath and his intermittent Lasix dose changed to 40 mg daily.  On 05/06/2015 he underwent a nuclear stress test which was interpreted as a high risk study  due to an ejection fraction of 31%.  There was a large fixed inferior and inferoseptal wall defect suggestive of scar and evidence for inferoseptal akinesis.  There was no ischemia.   On his prior nuclear study the EF in October 2012 was 38%.  An echo Doppler study done the same day however, showed discordant data and his ejection fraction was 50-55%.  Hypokinesis of the inferior inferolateral wall was noted.  There was grade 1 diastolic dysfunction.  There was mild aortic root dilatation at 41 mm.  His left atrium was severely dilated.  His right atrium was severely dilated.  There was trivial TR.  Of note, his last echo Doppler study had shown an EF of 45-50%.  When I  saw him in November 2017  he stated that he has been using CPAP.  However, for the past month he has noticed significant increased fatigue.  He was also found to have elevated sugars and his insulin was adjusted.  He admits to development of some trace ankle swelling, left greater than right.  He denied any episodes of chest pain.  He was unaware of spells of tachycardia.  He denied presyncope or syncope.  During that evaluation, he was found to be in atrial flutter with variable block at 74 bpm of questionable duration.  At that time, I recommended that he discontinue Plavix and started him on eliquis  5 mg and further titrated his Bystolic to 10 mg.  His cha2ds2vascore is at least 4 and with his CAD he was advised to continue aspirin 81 mg.  He underwent an echo Doppler study on 06/02/2016 which showed an EF of 40-45% with moderate diffuse hypokinesis.  While they are, mild aortic sclerosis and increased atrial septal thickness consistent with lipomatous hypertrophy.  I obtained a download of his CPAP unit from 02/09/2016 through 05/08/2016 and he was continuing to meet Medicare compliance with 91% of days of usage and 79% with usage greater than 4 hours.  He was averaging 5 hours and 55 minutes per night.  AHI was 3.7.  However, he had a very  large leak.  I have recommended that he get a new mask from his DME company.    He underwent successful cardioversion for atrial flutter.  He is unaware of any recurrent arrhythmia.  He continues to be on amiodarone, and Bystolic.  He is on eliquis for anticoagulation.  He has had difficulty with pain in the center of his back.  He has seen [name] and had undergone an MRI and was told of having disc disease.  He has noticed some shortness of breath.  He had undergone PFTs by [name] and was not found to have significant COPD or restrictive lung disease. [name]  He continues to be on amiodarone.  He has been on [id number] mg  And bystolic for hypertension and rosuvastatin 20 mg for hyperlipidemia.  Unfortunately he is still smoking at least a half a pack per day and is smoked for over 55 years.  Continues to use CPAP with 100% compliance.  He is diabetic on insulin.  He is diabetic on insulin.  He is retired as a Chartered certified accountant for [employer]. He had experienced some back discomfort.  He denies any classic exertional chest tightness.    An echo Doppler study on 04/26/2017 which showed an EF of 40-45% with grade 1 diastolic dysfunction.  There was borderline aortic root dilatation.  PA pressure was 31 mm.  He had mild LA and mild to moderate RA .  A follow-up nuclear perfusion study on 04/27/2017 continued to be low risk and showed findings consistent with prior inferolateral myocardial infarction.  The EF on the nuclear study was less than the echo at 34%.   He has had issues with shortness of breath and has been under evaluation by [name].  Fortunately, he finally quit tobacco in February 2019.  He is felt to have interstitial lung disease. Pulmonary function studies have shown restriction with low DLCO.  He felt possibly also to have asthma based on nitric oxide testing.  With his lung disease it has been recommended that amiodarone be considered for discontinuance.  He has had issues with  cough.  His ACE inhibitor was ultimately changed by [name] and he was started on low-dose Entresto at 24/26 mm twice a day in light of his reduce LV function.   When It saw him in June 2019 due to his lung disease, I recommended discontinuance of amiodarone.  I recommended that if his heart rate increased to greater than 60 bpm should increase Bystolic to 7.5 mg from his current dose at 5 mg.  I had a long discussion with him regarding Delene Loll importance of therapy and recommended further titration to 49/51 mg twice a day.  He continues to use CPAP therapy mid to 100% compliance.  He has felt well with Entresto.  He is He is seen  by Dr. Abigail Miyamoto for pulmonary follow-up.   When I saw him in August 2019 and discussed further titration of Entresto to 97/103 mg twice a day but he preferred to stay on the 49/5100 regimen.  His blood pressure was stable.  He continue to use CPAP with 100% compliance.   I last saw him in December 2019.  At that time his shortness of breath was significantly improved which was contributed by his ultimate quitting of smoking in February 2019 as well as his discontinuance of amiodarone.  He denies PND orthopnea.  He denies recent wheezing.  However, his blood sugar has not been well controlled.  Recent hemoglobin A1c was 10.0.  He has been on insulin.  He continues to tolerate Entresto 49/51 mg twice a day in addition to Bystolic 5 mg.  He is on rosuvastatin 20 mg.  Laboratory has shown an LDL cholesterol excellent at 60.  He continues to be on Singulair and as needed albuterol.    On August 19, 2018 he underwent a follow-up echo Doppler study.  EF was now 50 to 55%.  There was moderate dilation of his left ventricle and evidence for mild inferior/inferolateral hypokinesis.  There was mild pulmonary hypertension.  He feels well.  He denies chest pain PND orthopnea.  He presents for reevaluation.   Past Medical History:  Diagnosis Date   Atrial flutter ([place])    s/p  cardioversion   Coronary artery disease    Diabetes mellitus    GERD (gastroesophageal reflux disease)    History of nuclear stress test 04/04/2011   lexiscan; mod-large in size fixed inferolateral defect (scar); non-diagnostic for ischemia; low risk scan    Hyperlipidemia    Hypertension    Left foot drop    r/t past disk srugery - uses Kevlar brace   Myocardial infarction ([place])    posterior MI   Shortness of breath    Sleep apnea    on CPAP; 04/28/2007 split-night - AHI during total sleep 44.43/hr and REM 72.56/hr    Past Surgical History:  Procedure Laterality Date   [place]  2010   6 stents total   CARDIAC CATHETERIZATION  01/2000   percutaneous transluminal coronary balloon angioplasty of mid RCA stenotic lesion   CARDIAC CATHETERIZATION  06/2006   no stenting; ischemic cardiomyopathy, EF 40-45%   CARDIOVERSION N/A 07/28/2016   Procedure: CARDIOVERSION;  Surgeon: Troy Sine, MD;  Location: [facility];  Service: Cardiovascular;  Laterality: N/A;   CORONARY ANGIOPLASTY  09/1998   mid-distal RCA balloon dilatation, 4.5 & 5.0 stents    CORONARY ANGIOPLASTY WITH STENT PLACEMENT  03/1994   angioplasty & stenting (non-DES) of circumflex/prox ramus intermedius   CORONARY ANGIOPLASTY WITH STENT PLACEMENT  10/1994   large iliac [id number] stent to RCA   [place]  12/2002   4.48m stents x2 of RCA   CORONARY ANGIOPLASTY WITH STENT PLACEMENT  01/2005   cutting balloon arthrectomy of distal RCA & Cypher DES 3.5x13; cutting balloon arthrectomy of mid RCA with Cypher DES 3.5x18   CORONARY ANGIOPLASTY WITH STENT PLACEMENT  11/2008   stenting of mid RCA with 4.0x148mdriver, non-DES   LEFT HEART CATHETERIZATION WITH CORONARY ANGIOGRAM N/A 02/27/2012   Procedure: LEFT HEART CATHETERIZATION WITH CORONARY ANGIOGRAM;  Surgeon: JoLorretta HarpMD;  [facility];  Service: Cardiovascular;  Laterality:  N/A;   TRANSTHORACIC ECHOCARDIOGRAM  07/29/2010  EF 50=55%, mod inf wall hypokinesis & mild post wall hypokinesis; LA mild-mod dilated; mild mitral annular calcif & mild MR; mild TR & elevated RV systolic pressure; AV mildly sclerotic; mild aortic root dilatation     Allergies  Allergen Reactions   Benazepril Other (See Comments)    hyperkalemia   Fish Allergy Itching   Omega-3 Fatty Acids Hives and Itching   Fish Oil Itching and Rash    Current Outpatient Medications  Medication Sig Dispense Refill   albuterol (PROVENTIL HFA;VENTOLIN HFA) 108 (90 Base) MCG/ACT inhaler Inhale 2 puffs into the lungs every 6 (six) hours as needed for wheezing or shortness of breath. 1 Inhaler 6   ARNUITY ELLIPTA 200 MCG/ACT AEPB TAKE 1 PUFF BY MOUTH EVERY DAY 30 each 5   aspirin 81 MG tablet Take 81 mg by mouth daily.     BYSTOLIC 5 MG tablet TAKE 1 TABLET BY MOUTH EVERY DAY 30 tablet 5   empagliflozin (JARDIANCE) 25 MG TABS tablet Take 25 mg by mouth daily. 30 tablet 5   ENTRESTO 49-51 MG TAKE 1 TABLET BY MOUTH TWICE A DAY 60 tablet 5   glucose blood (ONE TOUCH ULTRA TEST) test strip USE TO CHECK BLOOD SUGAR 3 TIMES A DAY (E11.9) 100 each 2   Insulin Detemir (LEVEMIR FLEXTOUCH) 100 UNIT/ML Pen Inject 46 Units into the skin daily. 15 mL 3   Insulin Pen Needle (NOVOFINE) 32G X 6 MM MISC 1 each by Other route daily. 100 each 3   montelukast (SINGULAIR) 10 MG tablet TAKE 1 TABLET BY MOUTH EVERY DAY 90 tablet 3   pantoprazole (PROTONIX) 40 MG tablet Take 1 tablet (40 mg total) by mouth daily. 30 tablet 3   rosuvastatin (CRESTOR) 20 MG tablet TAKE 1 TABLET BY MOUTH EVERYDAY AT BEDTIME 30 tablet 6   nitroGLYCERIN (NITROSTAT) 0.4 MG SL tablet PLACE 1 TABLET (0.4 MG TOTAL) UNDER THE TONGUE EVERY 5 (FIVE) MINUTES AS NEEDED FOR CHEST PAIN. (Patient not taking: Reported on 08/29/2018) 25 tablet 1   No current facility-administered medications for this visit.     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 [employer]  Social Designer, fashion/clothing strain: Not on file   Food insecurity:    Worry: Not on file    Inability: Not on file   Transportation needs:    Medical: Not on file    Non-medical: Not on file  Tobacco Use   Smoking status: Former Smoker    Packs/day: 1.00    Years: 50.00    Pack years: 50.00    Types: Cigarettes    Last attempt to quit: 08/04/2017    Years since quitting: 1.0   Smokeless tobacco: Never Used  Substance and Sexual Activity   Alcohol use: Yes    Alcohol/week: 0.0 standard drinks    Comment: occasionally   Drug use: No   Sexual activity: Yes  Lifestyle   Physical activity:    Days per week: Not on file    Minutes per session: Not on file   Stress: Not on file  Relationships   Social connections:    Talks on phone: Not on file  Gets together: Not on file    Attends religious service: Not on file    Active member of club or organization: Not on file    Attends meetings of clubs or organizations: Not on file    Relationship status: Not on file   Intimate partner violence:    Fear of current or ex partner: Not on file    Emotionally abused: Not on file    Physically abused: Not on file    Forced sexual activity: Not on file  Other Topics Concern   Not on file  Social History Narrative   Not on file   Social history is known that he works as a Freight forwarder for [employer]. There is a long-standing tobacco history. He rarely drinks alcohol.  Family History  Problem Relation Age of Onset   Heart attack Father    ROS General: Negative; No fevers, chills, or night sweats; Positive for fatigue  HEENT: Positive for loss of hearing in his right ear Pulmonary: Under  evaluation for possible interstitial lung disease; this of breath improved. Cardiovascular: see history of present illness Positive for possible claudication GI: Negative; No nausea, vomiting, diarrhea, or abdominal pain GU: Negative; No dysuria, hematuria, or difficulty voiding Musculoskeletal: Negative; no myalgias, joint pain, or weakness Hematologic/Oncology: Negative; no easy bruising, bleeding Endocrine: Negative; no heat/cold intolerance; no diabetes Neuro: Negative; no changes in balance, headaches Skin: Negative; No rashes or skin lesions Psychiatric: Negative; No behavioral problems, depression Sleep: Positive for obstructive sleep apnea.  Mild residual daytime sleepiness; no bruxism, restless legs, hypnogognic hallucinations, no cataplexy Other comprehensive 14 point system review is negative.   PE BP 122/76    Pulse 63    Ht 6' (1.829 m)    Wt 226 lb 12.8 oz (102.9 kg)    SpO2 98%    BMI 30.76 kg/m    Repeat blood pressure by me was 120/76  Wt Readings from Last 3 Encounters:  08/29/18 226 lb 12.8 oz (102.9 kg)  06/21/18 227 lb (103 kg)  05/30/18 230 lb 8 oz (104.6 kg)   General: Alert, oriented, no distress.  Skin: normal turgor, no rashes, warm and dry HEENT: Normocephalic, atraumatic. Pupils equal round and reactive to light; sclera anicteric; extraocular muscles intact;  Nose without nasal septal hypertrophy Mouth/Parynx benign; Mallinpatti scale Neck: No JVD, no carotid bruits; normal carotid upstroke Lungs: clear to ausculatation and percussion; no wheezing or rales Chest wall: without tenderness to palpitation Heart: PMI not displaced, RRR, s1 s2 normal, 1/6 systolic murmur, no diastolic murmur, no rubs, gallops, thrills, or heaves Abdomen: soft, nontender; no hepatosplenomehaly, BS+; abdominal aorta nontender and not dilated by palpation. Back: no CVA tenderness Pulses 2+ Musculoskeletal: full range of motion, normal strength, no joint  deformities Extremities: no clubbing cyanosis or edema, Homan's sign negative  Neurologic: grossly nonfocal; Cranial nerves grossly wnl Psychologic: Normal mood and affect  ECG (independently read by me): Normal sinus rhythm at 63 bpm.  Old inferior infarct.  No ST segment changes.  Normal intervals.  December 2019 ECG (independently read by me): Sinus bradycardia  at 56; Old inferior MI  August 2019 ECG (independently read by me): Sinus bradycardia 50.  Old inferior infarct with Q waves fairly  June 2019 ECG (independently read by me): Sinus bradycardia at 47 bpm.  Inferior Q waves.  QTc interval 472 ms.  No ectopy.  December 2018 ECG (independently read by me): Sinus bradycardia 53 bpm.  Q waves inferiorly, QTc interval 463 ms.  November 2017 ECG (independently read by me): Atrial fibrillation at 57 bpm.  Inferior Q waves.  An small inferolateral Q waves.  QTc interval 418 ms.  06/05/2016 ECG (independently read by me): Atrial flutter with a rate of 79 bpm with variable block.  QTc interval 467 ms.  November 2017 ECG (independently read by me): Atrial flutter with variable block, ventricular rate at 74.  LVH by voltage criteria in aVL.  Inferior Q waves concordant with prior MI.  May 2017 ECG (independently read by me): Sinus bradycardia at 51 bpm.  Old inferior infarct pattern with prominent inferior lateral Q waves  05/03/2015 ECG (independently read by me): Sinus bradycardia 54 bpm with PVC.  Inferior and lateral Q waves concordant with inferior lateral MI  ECG (independently read by me): Sinus bradycardia 55 bpm.  Inferior Q waves compatible with old inferior MI.  No significant ST segment changes.  January 2015 ECG (independently read by me): Normal sinus rhythm at 75 beats per minute. Old inferior infarction with inferior Q waves. No other ST-T changes  LABS:  BMP Latest Ref Rng & Units 06/21/2018 02/14/2018 01/22/2018  Glucose 65 - 99 mg/dL 132(H) 215(H) 164(H)  BUN 7 - 25 mg/dL  '18 18 15  ' Creatinine 0.70 - 1.25 mg/dL 1.09 1.16 1.13  BUN/Creat Ratio 6 - 22 (calc) NOT APPLICABLE 16 NOT APPLICABLE  Sodium 761 - 146 mmol/L 142 145(H) 141  Potassium 3.5 - 5.3 mmol/L 4.8 5.1 5.3  Chloride 98 - 110 mmol/L 106 105 104  CO2 20 - 32 mmol/L '27 23 28  ' Calcium 8.6 - 10.3 mg/dL 9.4 9.1 9.2   Hepatic Function Latest Ref Rng & Units 06/21/2018 06/20/2017 04/19/2017  Total Protein 6.1 - 8.1 g/dL 5.7(L) 6.2 6.5  Albumin 3.6 - 4.8 g/dL - - 4.0  AST 10 - 35 U/L '16 22 18  ' ALT 9 - 46 U/L '20 28 29  ' Alk Phosphatase 39 - 117 IU/L - - 84  Total Bilirubin 0.2 - 1.2 mg/dL 0.6 0.5 0.8  Bilirubin, Direct - - - -    CBC Latest Ref Rng & Units 06/21/2018 09/11/2017 09/11/2017  WBC 3.8 - 10.8 Thousand/uL 6.3 8.2 9.3  Hemoglobin 13.2 - 17.1 g/dL 16.7 14.1 13.8  Hematocrit 38.5 - 50.0 % 49.6 45.0 41.9  Platelets 140 - 400 Thousand/uL 204 166 173   Lab Results  Component Value Date   MCV 89.9 06/21/2018   MCV 92.4 09/11/2017   MCV 89.0 09/11/2017   Lab Results  Component Value Date   TSH 1.72 09/11/2017    BNP    Component Value Date/Time   PROBNP 411 (H) 02/14/2018 1431    Lipid Panel     Component Value Date/Time   CHOL 124 06/21/2018 0812   CHOL 129 04/19/2017 1030   TRIG 93 06/21/2018 0812   HDL 34 (L) 06/21/2018 0812   HDL 35 (L) 04/19/2017 1030   CHOLHDL 3.6 06/21/2018 0812   VLDL 12 05/09/2016 0934   LDLCALC 72 06/21/2018 0812     RADIOLOGY: No results found.  IMPRESSION:  1. Coronary artery disease involving native coronary artery of native heart without angina pectoris   2. Essential hypertension   3. Chronic combined systolic and diastolic congestive heart failure ([place])   4. Anticoagulation adequate   5. OSA on CPAP   6. Typical atrial flutter ([place]); s/p DC cardioversion 07/28/2016   7. Hyperlipidemia LDL goal <70     ASSESSMENT AND PLAN: Mr. Schack is a  68 year-old Caucasian male who has CAD and has undergone prior extensive stenting to his RCA and  also stenting to his circumflex vesse dating back to 57.  An echo Doppler study in 2013 showed ejection fraction of 45-50%. There was severe inferior, inferolateral and inferoseptal hypokinesis in the basal inferolateral wall segments consistent with scar.  His last cardiac catheterization by [name] was in September 2013. A nuclear perfusion study in November 2016 showed a large area of inferior scar without associated ischemia.  The ejection fraction on the nuclear study was reduced at 31%, making the scan, a high-risk study. His echo Doppler study done the same day showed discordant dated with an EF of 50%, but also with inferior hypokinesis.  The echo Doppler study in November 2018 showed an EF of 40 to 45% with mild LVH, grade 1 diastolic dysfunction, and old inferior scar.  PA pressure was 31 mm.  He has done well without recurrent atrial flutter and remains off amiodarone.  He continues to be on Eliquis for anticoagulation and is not had any bleeding.  Is felt significantly improved with the addition of Entresto to his medical regimen.  When I saw him in December I recommended initiation of SGL2 inhibition with Jardiance gust with him a 38% reduction in CHF and improve mortality benefit.  He has tolerated this well at this has subsequently been titrated up to 25 mg.  He is breathing better.  He is not short of breath.  I reviewed his most recent echo Doppler study from August 19, 2018 which shows an EF now at 50 to 55%.  His LV is mildly dilated and there is his previous inferior to inferolateral hypokinesis consistent with his prior MI.  He is maintaining sinus rhythm.  Continues to be on CPAP for obstructive sleep apnea with 100% compliance.  He continues to be on rosuvastatin for hyperlipidemia with target LDL less than 70.  Time spent: 25 minutes Troy Sine, MD, [facility]  08/31/2018 11:15 AM

## 2018-08-31 ENCOUNTER — Encounter: Payer: Self-pay | Admitting: Cardiovascular Disease

## 2018-09-02 ENCOUNTER — Other Ambulatory Visit: Payer: Self-pay | Admitting: Family Medicine

## 2018-09-09 ENCOUNTER — Ambulatory Visit: Payer: Medicare Other | Admitting: Podiatrist

## 2018-09-11 ENCOUNTER — Encounter: Payer: Self-pay | Admitting: *Deleted

## 2018-09-16 NOTE — Progress Notes (Signed)

## 2018-09-23 ENCOUNTER — Other Ambulatory Visit: Payer: Self-pay | Admitting: Family Medicine

## 2018-09-23 ENCOUNTER — Other Ambulatory Visit: Payer: Self-pay

## 2018-09-23 ENCOUNTER — Encounter: Payer: Self-pay | Admitting: Podiatry

## 2018-09-23 ENCOUNTER — Ambulatory Visit (INDEPENDENT_AMBULATORY_CARE_PROVIDER_SITE_OTHER): Payer: Medicare Other | Admitting: Podiatry

## 2018-09-23 ENCOUNTER — Ambulatory Visit (INDEPENDENT_AMBULATORY_CARE_PROVIDER_SITE_OTHER): Payer: Medicare Other

## 2018-09-23 VITALS — Temp 98.1°F

## 2018-09-23 DIAGNOSIS — S92355D Nondisplaced fracture of fifth metatarsal bone, left foot, subsequent encounter for fracture with routine healing: Secondary | ICD-10-CM | POA: Diagnosis not present

## 2018-09-23 DIAGNOSIS — L989 Disorder of the skin and subcutaneous tissue, unspecified: Secondary | ICD-10-CM

## 2018-09-23 DIAGNOSIS — B351 Tinea unguium: Secondary | ICD-10-CM

## 2018-09-23 DIAGNOSIS — M79676 Pain in unspecified toe(s): Secondary | ICD-10-CM

## 2018-09-23 MED ORDER — MONTELUKAST SODIUM 10 MG PO TABS: 10.0000 mg | ORAL_TABLET | Freq: Every day | ORAL | 3 refills | Status: DC

## 2018-09-23 NOTE — Progress Notes (Signed)
HPI: 68 year old male Hx of T2DM presents the office today for follow-up evaluation regarding a Jones fracture to the left foot.  Patient also has elongated thickened toenails that he is unable to trim on his own.  Aggravated with shoe gear.  He also presents with pre-ulcerative callus lesions that get very symptomatic as they get larger.  Patient presents today for further treatment evaluation  Past Medical History:  Diagnosis Date  . Atrial flutter ([place])    s/p cardioversion  . Coronary artery disease   . Diabetes mellitus   . GERD (gastroesophageal reflux disease)   . History of nuclear stress test 04/04/2011   lexiscan; mod-large in size fixed inferolateral defect (scar); non-diagnostic for ischemia; low risk scan   . Hyperlipidemia   . Hypertension   . Left foot drop    r/t past disk srugery - uses Kevlar brace  . Myocardial infarction ([place])    posterior MI  . Shortness of breath   . Sleep apnea    on CPAP; 04/28/2007 split-night - AHI during total sleep 44.43/hr and REM 72.56/hr     Physical Exam: General: The patient is alert and oriented x3 in no acute distress.  Dermatology: Skin is warm, dry and supple bilateral lower extremities. Negative for open lesions or macerations.  Hyperkeratotic thickened discolored nails noted 1-5 bilateral.  Hyperkeratotic callus tissue noted to the bilateral feet x4.  Vascular: Palpable pedal pulses bilaterally. No edema or erythema noted. Capillary refill within normal limits.  Varicosities noted bilateral  Neurological: Epicritic and protective threshold diminished bilaterally.   Musculoskeletal Exam: Range of motion within normal limits to all pedal and ankle joints bilateral. Muscle strength 5/5 in all groups bilateral.  There is some tenderness to palpation along the base of the fifth metatarsal tubercle left foot  Radiographic Exam:  Fracture noted at the metaphyseal diaphyseal junction of the fifth metatarsal left foot consistent with  Jones fracture.  Based on prior exam it appears that the gapping has increased.  Gap measures approximately 36mm  Assessment: 1. T2DM w/ peripheral polyneuropathy 2.  Pain due to onychomycosis of toenail 3.  Pre-ulcerative callus lesions bilateral x4 4.  Jones fracture left foot with increased gap compared to prior x-rays on 08/05/2018   Plan of Care:  1. Patient evaluated. X-Rays reviewed.  2.  I explained the patient that it may be beneficial to consider surgical ORIF of the Jones fracture with intramedullary screw fixation.  Currently the pain patient does not have any pain and tenderness associated to the fracture.  Pain is very minimal.  We will give it an additional month to see if his symptoms improve or get worse and in 4 weeks we will reconsider possible surgical ORIF 3.  Mechanical debridement of nails 1-5 performed bilateral using a nail nipper without incident or bleeding 4.  Excisional debridement of the hyperkeratotic callus lesions was performed using a chisel blade without incident or bleeding 5.  Return to clinic in 4 weeks      Edrick Kins, DPM [facility]  Dr. Edrick Kins, DPM    [address] [place] [address]                Office [phone]  Fax [phone]

## 2018-10-04 ENCOUNTER — Telehealth: Payer: Self-pay | Admitting: *Deleted

## 2018-10-04 NOTE — Telephone Encounter (Signed)
Pt presented to the office for replacement strap for cam walker boot. I replaced the center ankle strap and pt states the boot no longer slips.

## 2018-10-12 ENCOUNTER — Other Ambulatory Visit: Payer: Self-pay | Admitting: Cardiovascular Disease

## 2018-10-14 ENCOUNTER — Other Ambulatory Visit: Payer: Self-pay | Admitting: *Deleted

## 2018-10-14 MED ORDER — PANTOPRAZOLE SODIUM 40 MG PO TBEC: 40.0000 mg | DELAYED_RELEASE_TABLET | Freq: Every day | ORAL | 3 refills | Status: DC

## 2018-10-14 NOTE — Telephone Encounter (Signed)
Bystolic 5 mg refilled.

## 2018-10-15 ENCOUNTER — Ambulatory Visit: Payer: Medicare Other | Admitting: Podiatry

## 2018-10-15 DIAGNOSIS — J383 Other diseases of vocal cords: Secondary | ICD-10-CM | POA: Diagnosis not present

## 2018-10-15 DIAGNOSIS — Z87891 Personal history of nicotine dependence: Secondary | ICD-10-CM | POA: Diagnosis not present

## 2018-10-15 DIAGNOSIS — R490 Dysphonia: Secondary | ICD-10-CM | POA: Diagnosis not present

## 2018-10-15 DIAGNOSIS — Z7289 Other problems related to lifestyle: Secondary | ICD-10-CM | POA: Diagnosis not present

## 2018-10-23 ENCOUNTER — Ambulatory Visit (INDEPENDENT_AMBULATORY_CARE_PROVIDER_SITE_OTHER): Payer: Medicare Other | Admitting: Podiatry

## 2018-10-23 ENCOUNTER — Ambulatory Visit: Payer: Medicare Other | Admitting: Podiatry

## 2018-10-23 ENCOUNTER — Encounter: Payer: Self-pay | Admitting: Podiatry

## 2018-10-23 ENCOUNTER — Ambulatory Visit (INDEPENDENT_AMBULATORY_CARE_PROVIDER_SITE_OTHER): Payer: Medicare Other

## 2018-10-23 ENCOUNTER — Other Ambulatory Visit: Payer: Self-pay

## 2018-10-23 VITALS — Temp 97.5°F

## 2018-10-23 DIAGNOSIS — L84 Corns and callosities: Secondary | ICD-10-CM | POA: Diagnosis not present

## 2018-10-23 DIAGNOSIS — E1142 Type 2 diabetes mellitus with diabetic polyneuropathy: Secondary | ICD-10-CM

## 2018-10-23 DIAGNOSIS — M2011 Hallux valgus (acquired), right foot: Secondary | ICD-10-CM

## 2018-10-23 DIAGNOSIS — S92355D Nondisplaced fracture of fifth metatarsal bone, left foot, subsequent encounter for fracture with routine healing: Secondary | ICD-10-CM

## 2018-10-23 DIAGNOSIS — E0843 Diabetes mellitus due to underlying condition with diabetic autonomic (poly)neuropathy: Secondary | ICD-10-CM | POA: Diagnosis not present

## 2018-10-23 DIAGNOSIS — M2012 Hallux valgus (acquired), left foot: Secondary | ICD-10-CM | POA: Diagnosis not present

## 2018-10-23 DIAGNOSIS — M2042 Other hammer toe(s) (acquired), left foot: Secondary | ICD-10-CM

## 2018-10-23 NOTE — Progress Notes (Signed)
   HPI: 68 year old male Hx of T2DM presents the office today for follow-up evaluation regarding a Jones fracture to the left foot.  Patient has minimal pain associated to the fracture.  Only time it really hurts he says is when he goes barefoot at the house.  With regular shoe gear he is very asymptomatic with minimal pain.  He presents for follow-up treatment and evaluation  Past Medical History:  Diagnosis Date  . Atrial flutter ([place])    s/p cardioversion  . Coronary artery disease   . Diabetes mellitus   . GERD (gastroesophageal reflux disease)   . History of nuclear stress test 04/04/2011   lexiscan; mod-large in size fixed inferolateral defect (scar); non-diagnostic for ischemia; low risk scan   . Hyperlipidemia   . Hypertension   . Left foot drop    r/t past disk srugery - uses Kevlar brace  . Myocardial infarction ([place])    posterior MI  . Shortness of breath   . Sleep apnea    on CPAP; 04/28/2007 split-night - AHI during total sleep 44.43/hr and REM 72.56/hr     Physical Exam: General: The patient is alert and oriented x3 in no acute distress.  Dermatology: Skin is warm, dry and supple bilateral lower extremities. Negative for open lesions or macerations.  Hyperkeratotic thickened discolored nails noted 1-5 bilateral.  Hyperkeratotic callus tissue noted to the bilateral feet x4.  Vascular: Palpable pedal pulses bilaterally. No edema or erythema noted. Capillary refill within normal limits.  Varicosities noted bilateral  Neurological: Epicritic and protective threshold diminished bilaterally.   Musculoskeletal Exam: Range of motion within normal limits to all pedal and ankle joints bilateral. Muscle strength 5/5 in all groups bilateral.  There is some tenderness to palpation along the base of the fifth metatarsal tubercle left foot  Radiographic Exam:  Fracture noted at the metaphyseal diaphyseal junction of the fifth metatarsal left foot consistent with Jones fracture with  evidence of routine healing.  Radiopacity noted within the fracture gap and the fracture appears to be closing slightly.  Assessment: 1. T2DM w/ peripheral polyneuropathy 2.  Jones fracture left foot with increased gap compared to prior x-ray  Plan of Care:  1. Patient evaluated. X-Rays reviewed.  2.  At the moment we are going to forego surgical ORIF.  Patient is minimally symptomatic and has little to no pain with regular supportive diabetic shoes.  I explained that the fracture may take several months to heal.  3.  Patient may discontinue cam walker.  Resume good supportive diabetic shoes 4.  Today diabetic shoes were dispensed with break-in instructions provided. 5.  Return to clinic in 2 months for routine care and we will take new x-rays to reevaluate the fracture     Edrick Kins, DPM [facility]  Dr. Edrick Kins, DPM    [address], [place] [address]                Office [phone]  Fax [phone]

## 2018-11-07 ENCOUNTER — Ambulatory Visit (HOSPITAL_COMMUNITY)
Admission: RE | Admit: 2018-11-07 | Discharge: 2018-11-07 | Disposition: A | Discharge status: Home / Self Care | Payer: Medicare Other | Source: Ambulatory Visit | Attending: Internal Medicine | Admitting: Internal Medicine

## 2018-11-07 ENCOUNTER — Other Ambulatory Visit: Payer: Self-pay

## 2018-11-07 DIAGNOSIS — J849 Interstitial pulmonary disease, unspecified: Secondary | ICD-10-CM | POA: Insufficient documentation

## 2018-11-12 ENCOUNTER — Other Ambulatory Visit: Payer: Self-pay | Admitting: Cardiovascular Disease

## 2018-11-26 DIAGNOSIS — J383 Other diseases of vocal cords: Secondary | ICD-10-CM | POA: Diagnosis not present

## 2018-11-26 DIAGNOSIS — J385 Laryngeal spasm: Secondary | ICD-10-CM | POA: Diagnosis not present

## 2018-11-26 DIAGNOSIS — Z87891 Personal history of nicotine dependence: Secondary | ICD-10-CM | POA: Diagnosis not present

## 2018-11-26 DIAGNOSIS — R490 Dysphonia: Secondary | ICD-10-CM | POA: Diagnosis not present

## 2018-12-13 ENCOUNTER — Other Ambulatory Visit: Payer: Self-pay | Admitting: Family Medicine

## 2018-12-13 ENCOUNTER — Other Ambulatory Visit: Payer: Self-pay | Admitting: Cardiovascular Disease

## 2018-12-13 NOTE — Telephone Encounter (Signed)
Pt is a 68 yr old male who saw Dr. Claiborne Billings on 08/29/18,weight at that visit was 102.9Kg. SCr on 06/21/18 was 1.09. Will refill Eliquis 5mg  BID.

## 2018-12-14 ENCOUNTER — Other Ambulatory Visit: Payer: Self-pay | Admitting: Family Medicine

## 2018-12-16 ENCOUNTER — Other Ambulatory Visit: Payer: Self-pay | Admitting: Internal Medicine

## 2018-12-16 ENCOUNTER — Other Ambulatory Visit: Payer: Medicare Other

## 2018-12-16 DIAGNOSIS — R6889 Other general symptoms and signs: Secondary | ICD-10-CM | POA: Diagnosis not present

## 2018-12-16 DIAGNOSIS — Z20822 Contact with and (suspected) exposure to covid-19: Secondary | ICD-10-CM

## 2018-12-18 ENCOUNTER — Ambulatory Visit: Payer: Medicare Other | Admitting: Family Medicine

## 2018-12-20 LAB — NOVEL CORONAVIRUS, NAA: SARS-CoV-2, NAA: NOT DETECTED

## 2018-12-23 ENCOUNTER — Ambulatory Visit: Payer: Medicare Other | Admitting: Podiatry

## 2018-12-23 ENCOUNTER — Ambulatory Visit (INDEPENDENT_AMBULATORY_CARE_PROVIDER_SITE_OTHER): Payer: Medicare Other | Admitting: Podiatry

## 2018-12-23 ENCOUNTER — Ambulatory Visit (INDEPENDENT_AMBULATORY_CARE_PROVIDER_SITE_OTHER): Payer: Medicare Other

## 2018-12-23 ENCOUNTER — Encounter: Payer: Self-pay | Admitting: Podiatry

## 2018-12-23 ENCOUNTER — Other Ambulatory Visit: Payer: Self-pay

## 2018-12-23 VITALS — Temp 98.4°F

## 2018-12-23 DIAGNOSIS — S92355D Nondisplaced fracture of fifth metatarsal bone, left foot, subsequent encounter for fracture with routine healing: Secondary | ICD-10-CM

## 2018-12-23 DIAGNOSIS — B351 Tinea unguium: Secondary | ICD-10-CM

## 2018-12-23 DIAGNOSIS — M7752 Other enthesopathy of left foot: Secondary | ICD-10-CM

## 2018-12-23 DIAGNOSIS — B070 Plantar wart: Secondary | ICD-10-CM

## 2018-12-23 DIAGNOSIS — M79676 Pain in unspecified toe(s): Secondary | ICD-10-CM

## 2018-12-23 NOTE — Progress Notes (Signed)
HPI: 68 year old male Hx of T2DM presents the office today for follow-up evaluation regarding a Jones fracture to the left foot.  Patient no longer has complaints regarding the Jones fracture.  He denies any pain or symptoms associated with the fracture site.  Today he complains of elongated, thickened toenails bilateral 1-5.  He is unable to trim his own nails.  He also complains of pain and sensitivity associated to the fifth digit of the left foot.  There is a lesion noted to the lateral aspect of the fifth digit it is very tender to palpation.  He presents for further treatment and evaluation  Past Medical History:  Diagnosis Date  . Atrial flutter ([place])    s/p cardioversion  . Coronary artery disease   . Diabetes mellitus   . GERD (gastroesophageal reflux disease)   . History of nuclear stress test 04/04/2011   lexiscan; mod-large in size fixed inferolateral defect (scar); non-diagnostic for ischemia; low risk scan   . Hyperlipidemia   . Hypertension   . Left foot drop    r/t past disk srugery - uses Kevlar brace  . Myocardial infarction ([place])    posterior MI  . Shortness of breath   . Sleep apnea    on CPAP; 04/28/2007 split-night - AHI during total sleep 44.43/hr and REM 72.56/hr     Physical Exam: General: The patient is alert and oriented x3 in no acute distress.  Dermatology: Skin is warm, dry and supple bilateral lower extremities. Negative for open lesions or macerations.  Hyperkeratotic thickened discolored nails noted 1-5 bilateral.  Benign skin lesion noted to the distal tuft lateral aspect of the fifth digit left foot with associated tenderness to palpation  Vascular: Palpable pedal pulses bilaterally. No edema or erythema noted. Capillary refill within normal limits.  Varicosities noted bilateral  Neurological: Epicritic and protective threshold diminished bilaterally.   Musculoskeletal Exam: Range of motion within normal limits to all pedal and ankle joints  bilateral. Muscle strength 5/5 in all groups bilateral.  Tenderness to palpation and range of motion noted to the fifth digit left foot  Radiographic Exam:  Fracture noted at the metaphyseal diaphyseal junction of the fifth metatarsal left foot consistent with Jones fracture with evidence of routine healing.  Fracture gap appears improved compared to prior x-rays  Assessment: 1. T2DM w/ peripheral polyneuropathy 2.  Jones fracture left foot with increased gap compared to prior x-ray 3.  Capsulitis fifth digit left foot 4.  Benign skin lesion fifth digit left foot  Plan of Care:  1. Patient evaluated. X-Rays reviewed.  2.  Regarding the Jones fracture the patient may resume full activity with no restrictions 3.  Mechanical debridement of nails 1-5 bilateral was performed using a nail nipper without incident or bleeding 4.  Injection of 0.5 cc Celestone Soluspan injected in the fifth digit left foot 5.  Debridement of the benign skin lesion was performed to the fifth digit left foot.  There was a soft tissue mass extending into the subcutaneous tissue.  This was all removed and debrided.  Dry sterile dressing was applied. 6.  Return to clinic in 2 weeks for follow-up   Edrick Kins, DPM [facility]  Dr. Edrick Kins, DPM    [address]  [place], [place] [address]                Office [phone]  Fax [phone]

## 2019-01-06 ENCOUNTER — Other Ambulatory Visit: Payer: Self-pay

## 2019-01-06 ENCOUNTER — Encounter: Payer: Self-pay | Admitting: Podiatry

## 2019-01-06 ENCOUNTER — Ambulatory Visit (INDEPENDENT_AMBULATORY_CARE_PROVIDER_SITE_OTHER): Payer: Medicare Other | Admitting: Podiatry

## 2019-01-06 VITALS — Temp 97.4°F

## 2019-01-06 DIAGNOSIS — M7989 Other specified soft tissue disorders: Secondary | ICD-10-CM

## 2019-01-06 DIAGNOSIS — R2240 Localized swelling, mass and lump, unspecified lower limb: Secondary | ICD-10-CM | POA: Diagnosis not present

## 2019-01-06 DIAGNOSIS — M7752 Other enthesopathy of left foot: Secondary | ICD-10-CM

## 2019-01-06 DIAGNOSIS — M898X7 Other specified disorders of bone, ankle and foot: Secondary | ICD-10-CM | POA: Diagnosis not present

## 2019-01-06 NOTE — Patient Instructions (Signed)
Pre-Operative Instructions  Congratulations, you have decided to take an important step towards improving your quality of life.  You can be assured that the doctors and staff at [facility] will be with you every step of the way.  Here are some important things you should know:  1. Plan to be at the surgery center/hospital at least 1 (one) hour prior to your scheduled time, unless otherwise directed by the surgical center/hospital staff.  You must have a responsible adult accompany you, remain during the surgery and drive you home.  Make sure you have directions to the surgical center/hospital to ensure you arrive on time. 2. If you are having surgery at [facility] or [facility] hospitals, you will need a copy of your medical history and physical form from your family physician within one month prior to the date of surgery. We will give you a form for your primary physician to complete.  3. We make every effort to accommodate the date you request for surgery.  However, there are times where surgery dates or times have to be moved.  We will contact you as soon as possible if a change in schedule is required.   4. No aspirin/ibuprofen for one week before surgery.  If you are on aspirin, any non-steroidal anti-inflammatory medications (Mobic, Aleve, Ibuprofen) should not be taken seven (7) days prior to your surgery.  You make take Tylenol for pain prior to surgery.  5. Medications - If you are taking daily heart and blood pressure medications, seizure, reflux, allergy, asthma, anxiety, pain or diabetes medications, make sure you notify the surgery center/hospital before the day of surgery so they can tell you which medications you should take or avoid the day of surgery. 6. No food or drink after midnight the night before surgery unless directed otherwise by surgical center/hospital staff. 7. No alcoholic beverages 24-hours prior to surgery.  No smoking 24-hours prior or 24-hours after  surgery. 8. Wear loose pants or shorts. They should be loose enough to fit over bandages, boots, and casts. 9. Don't wear slip-on shoes. Sneakers are preferred. 10. Bring your boot with you to the surgery center/hospital.  Also bring crutches or a walker if your physician has prescribed it for you.  If you do not have this equipment, it will be provided for you after surgery. 11. If you have not been contacted by the surgery center/hospital by the day before your surgery, call to confirm the date and time of your surgery. 12. Leave-time from work may vary depending on the type of surgery you have.  Appropriate arrangements should be made prior to surgery with your employer. 13. Prescriptions will be provided immediately following surgery by your doctor.  Fill these as soon as possible after surgery and take the medication as directed. Pain medications will not be refilled on weekends and must be approved by the doctor. 14. Remove nail polish on the operative foot and avoid getting pedicures prior to surgery. 15. Wash the night before surgery.  The night before surgery wash the foot and leg well with water and the antibacterial soap provided. Be sure to pay special attention to beneath the toenails and in between the toes.  Wash for at least three (3) minutes. Rinse thoroughly with water and dry well with a towel.  Perform this wash unless told not to do so by your physician.  Enclosed: 1 Ice pack (please put in freezer the night before surgery)   1 Hibiclens skin cleaner     Pre-op instructions  If you have any questions regarding the instructions, please do not hesitate to call our office.  [place]: [address], [place], [place] [address] -- [phone]  [place]: [address] [place], [place] [address] -- [phone]  [place]: [address]  [place], [place] [address] -- [phone]  [place]: [address], [address], [place], [place] [address] -- [phone]  Website:  [email or web address] 

## 2019-01-07 ENCOUNTER — Telehealth: Payer: Self-pay | Admitting: *Deleted

## 2019-01-07 NOTE — Telephone Encounter (Signed)
I left the patient a message to call me back.  I told him I need to change the time for his appointment on 01/20/2019.  We need to start his surgery at 12 noon instead of 7:45 am.  He needs to arrive at 11:45 am.

## 2019-01-08 NOTE — Progress Notes (Signed)
   HPI: 68 y.o. male presenting today for follow up evaluation of left foot pain. He states the foot is still very sensitive and it hurts to even wear socks and shoes. Patient is here for further evaluation and treatment.   Past Medical History:  Diagnosis Date  . Atrial flutter ([place])    s/p cardioversion  . Coronary artery disease   . Diabetes mellitus   . GERD (gastroesophageal reflux disease)   . History of nuclear stress test 04/04/2011   lexiscan; mod-large in size fixed inferolateral defect (scar); non-diagnostic for ischemia; low risk scan   . Hyperlipidemia   . Hypertension   . Left foot drop    r/t past disk srugery - uses Kevlar brace  . Myocardial infarction ([place])    posterior MI  . Shortness of breath   . Sleep apnea    on CPAP; 04/28/2007 split-night - AHI during total sleep 44.43/hr and REM 72.56/hr     Physical Exam: General: The patient is alert and oriented x3 in no acute distress.  Dermatology: Skin is warm, dry and supple bilateral lower extremities. Negative for open lesions or macerations.  Vascular: Palpable pedal pulses bilaterally. No edema or erythema noted. Capillary refill within normal limits.  Neurological: Epicritic and protective threshold grossly intact bilaterally.   Musculoskeletal Exam: Pain with palpation noted to the left fifth toe. Soft tissue mass noted to left fifth toe. Range of motion within normal limits to all pedal and ankle joints bilateral. Muscle strength 5/5 in all groups bilateral.    Assessment: 1. Capsulitis left fifth toe with soft tissue mass noted 2. Exostosis left fifth toe   Plan of Care:  1. Patient evaluated.  2. Today we discussed the conservative versus surgical management of the presenting pathology. The patient opts for surgical management. All possible complications and details of the procedure were explained. All patient questions were answered. No guarantees were expressed or implied. 3. Authorization for  in-office surgery was initiated today. In-office surgery will consist of excision of soft tissue mass left fifth toe; exostectomy left fifth toe.  4. Return to clinic Wednesday for in-office surgery.       Edrick Kins, DPM [facility]  Dr. Edrick Kins, DPM    [address], [place] [address]                Office [phone]  Fax [phone]

## 2019-01-10 NOTE — Telephone Encounter (Signed)
I left John [name] a message that appointment date is still for 01/20/2019 but his time has changed.  I informed him that he needs to be here at 11:45 am instead of 7:45 am on that date.  I asked him to give me a call if this time is not feasible.

## 2019-01-10 NOTE — Telephone Encounter (Signed)
"  My wife said she got a message, something about me calling to change an appointment.  The only appointment I have is for surgery.  So they sent me to you.  If you would call me back on my phone number."

## 2019-01-17 ENCOUNTER — Encounter: Payer: Self-pay | Admitting: Podiatry

## 2019-01-17 ENCOUNTER — Other Ambulatory Visit: Payer: Self-pay

## 2019-01-17 ENCOUNTER — Ambulatory Visit (INDEPENDENT_AMBULATORY_CARE_PROVIDER_SITE_OTHER): Payer: Medicare Other

## 2019-01-17 ENCOUNTER — Telehealth: Payer: Self-pay | Admitting: *Deleted

## 2019-01-17 ENCOUNTER — Ambulatory Visit (INDEPENDENT_AMBULATORY_CARE_PROVIDER_SITE_OTHER): Payer: Medicare Other | Admitting: Podiatry

## 2019-01-17 DIAGNOSIS — L97521 Non-pressure chronic ulcer of other part of left foot limited to breakdown of skin: Secondary | ICD-10-CM | POA: Diagnosis not present

## 2019-01-17 DIAGNOSIS — E08621 Diabetes mellitus due to underlying condition with foot ulcer: Secondary | ICD-10-CM

## 2019-01-17 MED ORDER — AMOXICILLIN-POT CLAVULANATE 875-125 MG PO TABS: 1.0000 | ORAL_TABLET | Freq: Two times a day (BID) | ORAL | 0 refills | Status: AC

## 2019-01-17 NOTE — Telephone Encounter (Signed)
"  I have a surgical procedure on Monday with Dr. Amalia Hailey.  I know he's on vacation but I have a couple of questions.  I started out seeing Dr. Milinda Pointer.  I don't know if you can help me or if Dr. Milinda Pointer can give me a call to answer a couple of questions.  Give me a call.  I am returning your call.  You have some questions regarding your surgery.  "Yes, I have two questions.  He had asked me if my sugar was under control.   I told him yes but what do you consider okay for me to have my surgery?  My average for the last three nights have been about 180, the mornings have been an average of 125.  It's been this way for a while now.  My doctor said it's okay in this range."  I think that's okay but I will ask a doctor to make sure.  "My other question is should I continue to take it?  I take Eliquis and Entresto."  It's okay to continue to take that medication.  "The last time I was there Dr. Amalia Hailey didn't check my entire foot.  I have a place in between my toes, it's in between the big toe and the next toe on my left foot.  It's usually hard in between there but this time it's soft skin and it's green.  There's a hole in the middle of it.  I put a bandaid over it last night and I took it off this morning and it's drained.  It has a reddish tint to it like blood."  Can you come in to see Dr. Elisha Ponder today?  We don't want to do surgery on your foot if you have any infection going on.  "Sure that will be fine.  What time do I need to be there?"  She has an opening at 2:45 pm or 3:15 pm.  "Put me down for the 2:45 pm appointment.  Like I said, I'm just concerned about my feet having this Diabetes and everything.

## 2019-01-17 NOTE — Patient Instructions (Signed)
DRESSING CHANGES 2nd TOE LEFT FOOT:  WEAR SURGICAL SHOE AT ALL TIMES   KEEP APPOINTMENT WITH DR. Amalia Hailey ON MONDAY   1. KEEP LEFT  FOOT DRY AT ALL TIMES!!!!  2. CLEANSE ULCER WITH SALINE.  3. DAB DRY WITH GAUZE SPONGE.  4. APPLY A SMALL AMOUNT PROMOGRAN WITH MEDIHONEY TO BASE OF ULCER.  5. APPLY OUTER DRESSING/FABRIC BAND-AID AS INSTRUCTED.  6. WEAR SURGICAL SHOE DAILY AT ALL TIMES.  7. DO NOT WALK BAREFOOT!!!  8.  IF YOU EXPERIENCE ANY FEVER, CHILLS, NIGHTSWEATS, NAUSEA OR VOMITING, ELEVATED OR LOW BLOOD SUGARS, REPORT TO EMERGENCY ROOM.  9. IF YOU EXPERIENCE INCREASED REDNESS, PAIN, SWELLING, DISCOLORATION, ODOR, PUS, DRAINAGE OR WARMTH OF YOUR FOOT, REPORT TO EMERGENCY ROOM.

## 2019-01-17 NOTE — Progress Notes (Signed)
Subjective:   Mr.  John [name] presents today with cc of painful left 2nd toe. Patient states foot has been bothering him for the past several days. He states he placed a band-aid on the area to keep it covered and he noticed drainage.  Yesterday, he noted skin had greenish tinge to it.  Patient desnies fever, chills, nightsweats, nausea or vomiting.  He is scheduled to have in-office procedure of the left 5th digit (exostectomy) with Dr. Amalia Hailey on Monday, but thought he should have his toe looked at before his procedure date.   Allergies  Allergen Reactions  . Benazepril Other (See Comments)    hyperkalemia  . Fish Allergy Itching  . Omega-3 Fatty Acids Hives and Itching  . Fish Oil Itching and Rash     Objective:   Vascular Examination: Vitals:   01/17/19 1446  BP: (!) 107/53  Pulse: (!) 58  Temp: (!) 97.3 F (36.3 C)    Capillary refill time immediate x 10 digits.  Dorsalis pedis pulses palpable left foot.  Posterior tibial pulses palpable left foot.  Digital hair absent x 5 digits left foot.  Skin temperature gradient WNL left foot.  Dermatological Examination: Skin with normal turgor, texture and tone left foot.  Toenails 1-5 left foot mycotic with recent debridement.  Ulceration located medial aspect left 2nd DIPJ:    Predebridement measurements carried out today of 1.5 x 1.5 x 0.1 cm. There is macerated tissue overlying fibrogranular base.  No periulcerative erythema, no edema, no drainage.  Flocculence is absent.  Malodor absent.      Postdebridement measurements today are: 1.5 x 1.0 x 0.2 cm with fibrogranular base.  No tracking, no tunneling, no undermining. No probing to bone, no purulent drainage.  No deep abscess evident.  Musculoskeletal: Muscle strength 5/5 to all LE muscle groups left LE.  Neurological: Sensation diminished with 10 gram monofilament.  Vibratory sensation diminished.  Xrays:  No evidence of bone erosion at left 2nd DIPJ  Evidence of fracture healing 5th met base NIDDM with peripheral neuropathy  Assessment:   1.  Diabetic Ulceration left 2nd digit DIPJ (interdigitally) 2.    NIDDM with peripheral neuropathy  Plan: 1. Ulcer was debrided and reactive hyperkeratoses and necrotic tissue was resected to the level of bleeding or viable tissue. Ulcer was cleansed with wound cleanser. Prisma Promogran with Medihoney was applied to base of wound with light dressing.  He was instructed to change his dressing once daily and keep his left foot dry until Monday.  2. Xray left foot was performed and reviewed with patient. 3. Rx sent to pharmacy for Augmentin 875 mg. He is to take one by mouth every 12 hours.  4. Patient was given instructions on offloading and dressing change/aftercare and was instructed to call immediately if any signs or symptoms of infection arise.  5. Patient is to follow up Monday with Dr. Amalia Hailey for re-evaluation. 6. Patient instructed to report to emergency department with worsening appearance of ulcer/toe/foot, increased pain, foul odor, increased redness, swelling, drainage, fever, chills, nightsweats, nausea, vomiting, increased blood sugar.  7. Patient/POA related understanding.

## 2019-01-20 ENCOUNTER — Other Ambulatory Visit: Payer: Self-pay

## 2019-01-20 ENCOUNTER — Encounter: Payer: Self-pay | Admitting: Podiatry

## 2019-01-20 ENCOUNTER — Ambulatory Visit (INDEPENDENT_AMBULATORY_CARE_PROVIDER_SITE_OTHER): Payer: Medicare Other | Admitting: Podiatry

## 2019-01-20 VITALS — Temp 97.2°F

## 2019-01-20 DIAGNOSIS — M7989 Other specified soft tissue disorders: Secondary | ICD-10-CM

## 2019-01-20 DIAGNOSIS — R2240 Localized swelling, mass and lump, unspecified lower limb: Secondary | ICD-10-CM

## 2019-01-20 DIAGNOSIS — M898X7 Other specified disorders of bone, ankle and foot: Secondary | ICD-10-CM

## 2019-01-24 ENCOUNTER — Ambulatory Visit (INDEPENDENT_AMBULATORY_CARE_PROVIDER_SITE_OTHER): Payer: Medicare Other | Admitting: Physician Assistant

## 2019-01-24 ENCOUNTER — Other Ambulatory Visit: Payer: Self-pay

## 2019-01-24 VITALS — BP 112/74 | HR 60 | Temp 97.0°F | Ht 72.0 in | Wt 221.6 lb

## 2019-01-24 DIAGNOSIS — I712 Thoracic aortic aneurysm, without rupture, unspecified: Secondary | ICD-10-CM

## 2019-01-24 DIAGNOSIS — I2510 Atherosclerotic heart disease of native coronary artery without angina pectoris: Secondary | ICD-10-CM

## 2019-01-24 DIAGNOSIS — J849 Interstitial pulmonary disease, unspecified: Secondary | ICD-10-CM | POA: Diagnosis not present

## 2019-01-24 DIAGNOSIS — I10 Essential (primary) hypertension: Secondary | ICD-10-CM | POA: Diagnosis not present

## 2019-01-24 DIAGNOSIS — E785 Hyperlipidemia, unspecified: Secondary | ICD-10-CM | POA: Diagnosis not present

## 2019-01-24 DIAGNOSIS — E119 Type 2 diabetes mellitus without complications: Secondary | ICD-10-CM | POA: Diagnosis not present

## 2019-01-24 DIAGNOSIS — I483 Typical atrial flutter: Secondary | ICD-10-CM

## 2019-01-24 DIAGNOSIS — R911 Solitary pulmonary nodule: Secondary | ICD-10-CM

## 2019-01-24 LAB — LIPID PANEL
Chol/HDL Ratio: 3.5 ratio (ref 0.0–5.0)
Cholesterol, Total: 115 mg/dL (ref 100–199)
HDL: 33 mg/dL — ABNORMAL LOW (ref >39)
LDL Calculated: 67 mg/dL (ref 0–99)
Triglycerides: 73 mg/dL (ref 0–149)
VLDL Cholesterol Cal: 15 mg/dL (ref 5–40)

## 2019-01-24 NOTE — Patient Instructions (Addendum)
Medication Instructions:  Your physician recommends that you continue on your current medications as directed. Please refer to the Current Medication list given to you today.  If you need a refill on your cardiac medications before your next appointment, please call your pharmacy.   Lab work: You will need to have labs (blood work) drawn today:  Fasting Lipid Panel  If you have labs (blood work) drawn today and your tests are completely normal, you will receive your results only by: [name] MyChart Message (if you have MyChart) OR . A paper copy in the mail If you have any lab test that is abnormal or we need to change your treatment, we will call you to review the results.  Testing/Procedures: NONE ordered at this time of appointment   Follow-Up: At [facility], you and your health needs are our priority.  As part of our continuing mission to provide you with exceptional heart care, we have created designated Provider Care Teams.  These Care Teams include your primary Cardiologist (physician) and Advanced Practice Providers (APPs -  Physician Assistants and Nurse Practitioners) who all work together to provide you with the care you need, when you need it. You will need a follow up appointment in 6 months (February 2021).  Please call our office in December 2020 to schedule this appointment.  You may see Shelva Majestic, MD or one of the following Advanced Practice Providers on your designated Care Team: [place], [place] . Fabian Sharp, PA-C  Any Other Special Instructions Will Be Listed Below (If Applicable).

## 2019-01-24 NOTE — Progress Notes (Signed)
Created in error

## 2019-01-24 NOTE — Progress Notes (Signed)
Cardiology Office Note    Date:  01/25/2019   ID:  John [name], John [name] [birth year], MRN [id number]  PCP:  [name], MD  Cardiologist:  Dr. Claiborne Billings   Chief Complaint  Patient presents with   Follow-up    seen for Dr. Claiborne Billings.     History of Present Illness:  John [name] is a 68 y.o. male with PMH of CAD, HTN, HLD, DM II, OSA, TAA, interstitial lung disease followed by pulmonology and atrial flutter.  He had previous PCI and stenting to left circumflex and RCA.  Last cardiac catheterization in 2013 showed patent RCA stent with 40% distal stenosis.  Myoview in November 2016 was high risk due to EF of 31%, large fixed inferior and inferolateral wall defect suggestive of scar, no ischemia was noted.  Echocardiogram done on the same day showed EF of 50 to 55%, hypokinesis of the inferior and inferolateral wall, grade 1 DD, mildly dilated aortic root at 41 mm.  Most recent Myoview obtained on 04/27/2017 continue to show EF of 34% with old inferior scar but no ischemia.  EF on myoview is likely underestimated as echocardiogram in November 2018 shows EF of 40 to 45%.  Last echocardiogram obtained on 08/19/2018 showed EF 50 to 55%, basal inferior and inferolateral hypokinesis.  When compared to the previous echocardiogram, this is unchanged.  Patient presents today for 70-month cardiology office follow-up.  He denies any recent chest pain or shortness of breath.  He has a high resolution CT obtained on 11/07/2018 and revealed a new 1 cm pulmonary nodule in the left upper lobe, stable benign 6 mm pulmonary nodule in the right middle lobe.  According to the patient, he is scheduling follow-up with [name].  I have also sent a staff message to [name] to make sure this is not going to fall through the cracks.  Otherwise he denies any lower extremity edema, orthopnea or PND.  He is doing quite well on the current medication.  His blood pressure is very well controlled.  He is due to have a  repeat fasting lipid panel otherwise he can follow-up with Dr. Claiborne Billings in 6 months.   Past Medical History:  Diagnosis Date   Atrial flutter ([place])    s/p cardioversion   Coronary artery disease    Diabetes mellitus    GERD (gastroesophageal reflux disease)    History of nuclear stress test 04/04/2011   lexiscan; mod-large in size fixed inferolateral defect (scar); non-diagnostic for ischemia; low risk scan    Hyperlipidemia    Hypertension    Left foot drop    r/t past disk srugery - uses Kevlar brace   Myocardial infarction ([place])    posterior MI   Shortness of breath    Sleep apnea    on CPAP; 04/28/2007 split-night - AHI during total sleep 44.43/hr and REM 72.56/hr    Past Surgical History:  Procedure Laterality Date   [place]  2010   6 stents total   CARDIAC CATHETERIZATION  01/2000   percutaneous transluminal coronary balloon angioplasty of mid RCA stenotic lesion   CARDIAC CATHETERIZATION  06/2006   no stenting; ischemic cardiomyopathy, EF 40-45%   CARDIOVERSION N/A 07/28/2016   Procedure: CARDIOVERSION;  Surgeon: Troy Sine, MD;  Location: [place];  Service: Cardiovascular;  Laterality: N/A;   CORONARY ANGIOPLASTY  09/1998   mid-distal RCA balloon dilatation, 4.5 & 5.0 stents    CORONARY ANGIOPLASTY  WITH STENT PLACEMENT  03/1994   angioplasty & stenting (non-DES) of circumflex/prox ramus intermedius   CORONARY ANGIOPLASTY WITH STENT PLACEMENT  10/1994   large iliac [id number] stent to RCA   CORONARY ANGIOPLASTY WITH STENT PLACEMENT  12/2002   4.78mm stents x2 of RCA   CORONARY ANGIOPLASTY WITH STENT PLACEMENT  01/2005   cutting balloon arthrectomy of distal RCA & Cypher DES 3.5x13; cutting balloon arthrectomy of mid RCA with Cypher DES 3.5x18   CORONARY ANGIOPLASTY WITH STENT PLACEMENT  11/2008   stenting of mid RCA with 4.0x68mm driver, non-DES   LEFT HEART CATHETERIZATION WITH CORONARY ANGIOGRAM N/A 02/27/2012    Procedure: LEFT HEART CATHETERIZATION WITH CORONARY ANGIOGRAM;  Surgeon: Lorretta Harp, MD;  Location: [facility] CATH LAB;  Service: Cardiovascular;  Laterality: N/A;   TRANSTHORACIC ECHOCARDIOGRAM  07/29/2010   EF 50=55%, mod inf wall hypokinesis & mild post wall hypokinesis; LA mild-mod dilated; mild mitral annular calcif & mild MR; mild TR & elevated RV systolic pressure; AV mildly sclerotic; mild aortic root dilatation     Current Medications: Outpatient Medications Prior to Visit  Medication Sig Dispense Refill   albuterol (PROVENTIL HFA;VENTOLIN HFA) 108 (90 Base) MCG/ACT inhaler Inhale 2 puffs into the lungs every 6 (six) hours as needed for wheezing or shortness of breath. 1 Inhaler 6   amoxicillin-clavulanate (AUGMENTIN) 875-125 MG tablet Take 1 tablet by mouth 2 (two) times daily for 10 days. 20 tablet 0   ARNUITY ELLIPTA 200 MCG/ACT AEPB TAKE 1 PUFF BY MOUTH EVERY DAY 30 each 5   aspirin 81 MG tablet Take 81 mg by mouth daily.     BYSTOLIC 5 MG tablet TAKE 1 TABLET BY MOUTH EVERY DAY 30 tablet 5   ELIQUIS 5 MG TABS tablet TAKE 1 TABLET BY MOUTH TWICE A DAY 60 tablet 9   ENTRESTO 49-51 MG TAKE 1 TABLET BY MOUTH TWICE A DAY 60 tablet 5   glucose blood test strip USE TO CHECK BLOOD SUGAR 3 TIMES A DAY (E11.9) 100 each 2   Insulin Pen Needle (NOVOFINE) 32G X 6 MM MISC 1 each by Other route daily. 100 each 3   JARDIANCE 25 MG TABS tablet TAKE 1 TABLET BY MOUTH EVERY DAY 30 tablet 5   LEVEMIR FLEXTOUCH 100 UNIT/ML Pen INJECT 46 UNITS INTO THE SKIN DAILY. 15 mL 3   montelukast (SINGULAIR) 10 MG tablet Take 1 tablet (10 mg total) by mouth daily. 90 tablet 3   nitroGLYCERIN (NITROSTAT) 0.4 MG SL tablet PLACE 1 TABLET (0.4 MG TOTAL) UNDER THE TONGUE EVERY 5 (FIVE) MINUTES AS NEEDED FOR CHEST PAIN. 25 tablet 1   pantoprazole (PROTONIX) 40 MG tablet Take 1 tablet (40 mg total) by mouth daily. 30 tablet 3   rosuvastatin (CRESTOR) 20 MG tablet TAKE 1 TABLET BY MOUTH EVERYDAY AT BEDTIME  30 tablet 10   No facility-administered medications prior to visit.      Allergies:   Benazepril, Fish allergy, Omega-3 fatty acids, and Fish oil   Social History   Socioeconomic History   Marital status: Married    Spouse name: Not on file   Number of children: 3   Years of education: Not on file   Highest education level: Not on file  Occupational History   Occupation: Best boy: [employer]    Comment: [place], [place]. [employer]  Social Needs   Financial resource strain: Not on file   Food insecurity    Worry: Not on file  Inability: Not on file   Transportation needs    Medical: Not on file    Non-medical: Not on file  Tobacco Use   Smoking status: Former Smoker    Packs/day: 1.00    Years: 50.00    Pack years: 50.00    Types: Cigarettes    Quit date: 08/04/2017    Years since quitting: 1.4   Smokeless tobacco: Never Used  Substance and Sexual Activity   Alcohol use: Yes    Alcohol/week: 0.0 standard drinks    Comment: occasionally   Drug use: No   Sexual activity: Yes  Lifestyle   Physical activity    Days per week: Not on file    Minutes per session: Not on file   Stress: Not on file  Relationships   Social connections    Talks on phone: Not on file    Gets together: Not on file    Attends religious service: Not on file    Active member of club or organization: Not on file    Attends meetings of clubs or organizations: Not on file    Relationship status: Not on file  Other Topics Concern   Not on file  Social History Narrative   Not on file     Family History:  The patient's family history includes Heart attack in his father.   ROS:   Please see the history of present illness.    ROS All other systems reviewed and are negative.   PHYSICAL EXAM:   VS:  BP 112/74    Pulse 60    Temp (!) 97 F (36.1 C) (Temporal)    Ht 6' (1.829 m)    Wt 221 lb 9.6 oz (100.5 kg)    SpO2 98%    BMI 30.05 kg/m    GEN: Well nourished,  well developed, in no acute distress  HEENT: normal  Neck: no JVD, carotid bruits, or masses Cardiac: RRR; no murmurs, rubs, or gallops,no edema  Respiratory:  clear to auscultation bilaterally, normal work of breathing GI: soft, nontender, nondistended, + BS MS: no deformity or atrophy  Skin: warm and dry, no rash Neuro:  Alert and Oriented x 3, Strength and sensation are intact Psych: euthymic mood, full affect  Wt Readings from Last 3 Encounters:  01/24/19 221 lb 9.6 oz (100.5 kg)  08/29/18 226 lb 12.8 oz (102.9 kg)  06/21/18 227 lb (103 kg)      Studies/Labs Reviewed:   EKG:  EKG is not ordered today.    Recent Labs: 02/14/2018: NT-Pro BNP 411 06/21/2018: ALT 20; BUN 18; Creat 1.09; Hemoglobin 16.7; Platelets 204; Potassium 4.8; Sodium 142   Lipid Panel    Component Value Date/Time   CHOL 115 01/24/2019 0930   TRIG 73 01/24/2019 0930   HDL 33 (L) 01/24/2019 0930   CHOLHDL 3.5 01/24/2019 0930   CHOLHDL 3.6 06/21/2018 0812   VLDL 12 05/09/2016 0934   LDLCALC 67 01/24/2019 0930   LDLCALC 72 06/21/2018 0812    Additional studies/ records that were reviewed today include:   Echo 08/19/2018 IMPRESSIONS    1. The left ventricle has low normal systolic function, with an ejection fraction of 50-55%. The cavity size was moderately dilated. Left ventricular diastolic Doppler parameters are consistent with impaired relaxation.  2. The right ventricle has normal systolic function. The cavity was normal. There is no increase in right ventricular wall thickness.  3. The mitral valve is normal in structure.  4. The tricuspid valve  is normal in structure.  5. The aortic valve is tricuspid Mild thickening of the aortic valve.  6. The pulmonic valve was normal in structure. Pulmonic valve regurgitation is mild by color flow Doppler.  7. LVEF is approximately 50 to 55% with basal inferior/inferolateral hypokinesis.   High resolution CT 11/07/2018 IMPRESSION: 1. No change in  minimal subpleural reticular interstitial pulmonary opacity, which remains "indeterminate for UIP (early)" by ATS pulmonary fibrosis criteria. Consider ongoing CT follow-up to evaluate for stability of fibrosis and pattern if indicated by clinical concern.  2. There is a new 1.0 cm pulmonary nodule of the left upper lobe (series 7, image 55), concerning for malignancy. Given size and location, this is amenable to both PET-CT and percutaneous CT-guided biopsy. At minimum, recommend follow-up CT in 3 months.  3.  Additional stable, benign small pulmonary nodules.  4.  Coronary artery disease.    ASSESSMENT:    1. Coronary artery disease involving native coronary artery of native heart without angina pectoris   2. Hyperlipidemia LDL goal <70   3. Essential hypertension   4. Controlled type 2 diabetes mellitus without complication, without long-term current use of insulin ([place])   5. Pulmonary nodule   6. Thoracic aortic aneurysm without rupture ([place])   7. Interstitial lung disease ([place])   8. Typical atrial flutter ([place]); s/p DC cardioversion 07/28/2016      PLAN:  In order of problems listed above:  1. CAD: Denies any recent chest pain.  Continue aspirin and statin.  2. Atrial flutter: Continue Eliquis 5 mg twice daily.  On Bystolic for rate control.  3. Hypertension: Blood pressure stable  4. Hyperlipidemia: Continue statin therapy  5. DM2: On insulin, managed by primary care provider  6. Pulmonary nodule: 1 cm lung nodule was noted on high-resolution CT in May.  Will defer work-up to pulmonology service  7. Thoracic aortic aneurysm: Previous echocardiogram in 2016 demonstrated dilated thoracic aorta at 41 mm, dilated thoracic aorta was not commented on recent CT of the chest  8. Interstitial lung disease: Followed by pulmonology service    Medication Adjustments/Labs and Tests Ordered: Current medicines are reviewed at length with the patient today.  Concerns  regarding medicines are outlined above.  Medication changes, Labs and Tests ordered today are listed in the Patient Instructions below. Patient Instructions  Medication Instructions:  Your physician recommends that you continue on your current medications as directed. Please refer to the Current Medication list given to you today.  If you need a refill on your cardiac medications before your next appointment, please call your pharmacy.   Lab work: You will need to have labs (blood work) drawn today:  Fasting Lipid Panel  If you have labs (blood work) drawn today and your tests are completely normal, you will receive your results only by:  MyChart Message (if you have MyChart) OR  A paper copy in the mail If you have any lab test that is abnormal or we need to change your treatment, we will call you to review the results.  Testing/Procedures: NONE ordered at this time of appointment   Follow-Up: At [facility], you and your health needs are our priority.  As part of our continuing mission to provide you with exceptional heart care, we have created designated Provider Care Teams.  These Care Teams include your primary Cardiologist (physician) and Advanced Practice Providers (APPs -  Physician Assistants and Nurse Practitioners) who all work together to provide you with the care you  need, when you need it. You will need a follow up appointment in 6 months (February 2021).  Please call our office in December 2020 to schedule this appointment.  You may see Shelva Majestic, MD or one of the following Advanced Practice Providers on your designated Care Team: Almyra Deforest, PA-C  Fabian Sharp, PA-C  Any Other Special Instructions Will Be Listed Below (If Applicable).       Hilbert Corrigan, [place]  01/25/2019 11:12 PM    [facility] [address], [place], [place]  [address] Phone: [phone]; Fax: [phone]

## 2019-01-25 ENCOUNTER — Encounter: Payer: Self-pay | Admitting: Physician Assistant

## 2019-01-27 ENCOUNTER — Other Ambulatory Visit: Payer: Self-pay

## 2019-01-27 ENCOUNTER — Ambulatory Visit (INDEPENDENT_AMBULATORY_CARE_PROVIDER_SITE_OTHER): Payer: Medicare Other

## 2019-01-27 ENCOUNTER — Ambulatory Visit (INDEPENDENT_AMBULATORY_CARE_PROVIDER_SITE_OTHER): Payer: Medicare Other | Admitting: Podiatry

## 2019-01-27 VITALS — Temp 97.8°F

## 2019-01-27 DIAGNOSIS — M7752 Other enthesopathy of left foot: Secondary | ICD-10-CM

## 2019-01-27 DIAGNOSIS — M7989 Other specified soft tissue disorders: Secondary | ICD-10-CM

## 2019-01-27 DIAGNOSIS — R2240 Localized swelling, mass and lump, unspecified lower limb: Secondary | ICD-10-CM

## 2019-01-27 DIAGNOSIS — L97522 Non-pressure chronic ulcer of other part of left foot with fat layer exposed: Secondary | ICD-10-CM

## 2019-01-29 NOTE — Progress Notes (Signed)
   Subjective:  Patient presents today status post exostectomy left fifth toe. DOS: 01/20/2019. He states he is doing well. He denies any significant pain or modifying factors. He has been using the post op shoe as directed. Patient is here for further evaluation and treatment.    Past Medical History:  Diagnosis Date  . Atrial flutter ([place])    s/p cardioversion  . Coronary artery disease   . Diabetes mellitus   . GERD (gastroesophageal reflux disease)   . History of nuclear stress test 04/04/2011   lexiscan; mod-large in size fixed inferolateral defect (scar); non-diagnostic for ischemia; low risk scan   . Hyperlipidemia   . Hypertension   . Left foot drop    r/t past disk srugery - uses Kevlar brace  . Myocardial infarction ([place])    posterior MI  . Shortness of breath   . Sleep apnea    on CPAP; 04/28/2007 split-night - AHI during total sleep 44.43/hr and REM 72.56/hr      Objective/Physical Exam Neurovascular status intact.  Skin incisions appear to be well coapted with sutures and staples intact. No sign of infectious process noted. No dehiscence. No active bleeding noted. Moderate edema noted to the surgical extremity.  Radiographic Exam:  Osteotomies sites appear to be stable with routine healing.  Assessment: 1. s/p exostectomy left fifth toe. DOS: 01/20/2019 2. Ulceration of the left 2nd toe secondary to venous insufficiency   Plan of Care:  1. Patient was evaluated. X-rays reviewed 2. Medically necessary excisional debridement including subcutaneous tissue was performed using a tissue nipper and a chisel blade. Excisional debridement of all the necrotic nonviable tissue down to healthy bleeding viable tissue was performed with post-debridement measurements same as pre-. 3. The wound was cleansed and dry sterile dressing applied. 4. Continue using Medi-Honey daily.  5. Continue using post op shoe.  6. Return to clinic in one week for suture removal.     Edrick Kins, DPM [facility]  Dr. Edrick Kins, [address]                                        [place], [place] [address]                Office [phone]  Fax [phone]

## 2019-01-30 ENCOUNTER — Telehealth: Payer: Self-pay | Admitting: Internal Medicine

## 2019-01-30 ENCOUNTER — Telehealth: Payer: Self-pay

## 2019-01-30 NOTE — Telephone Encounter (Addendum)
Left a voice message for the patient on both his home and mobile numbers asking that he give the office a call.  ----- Message from Almyra Deforest, [place] sent at 01/29/2019  4:09 PM EDT ----- Total cholesterol, triglyceride and bad cholesterol level quite excellent, chronically low good cholesterol. Continue current therapy

## 2019-01-30 NOTE — Telephone Encounter (Addendum)
Patient called for results. He had viewed in mychart. Advised of PA notes on lab results

## 2019-01-30 NOTE — Telephone Encounter (Signed)
Follow up   Patient is returning call for result per previous message. Please call.

## 2019-01-31 NOTE — Telephone Encounter (Signed)
L/M on pt vm to call back to schedule pft -pr

## 2019-02-03 ENCOUNTER — Other Ambulatory Visit: Payer: Self-pay

## 2019-02-03 ENCOUNTER — Encounter: Payer: Self-pay | Admitting: Podiatry

## 2019-02-03 ENCOUNTER — Ambulatory Visit (INDEPENDENT_AMBULATORY_CARE_PROVIDER_SITE_OTHER): Payer: Medicare Other | Admitting: Podiatry

## 2019-02-03 DIAGNOSIS — M7989 Other specified soft tissue disorders: Secondary | ICD-10-CM

## 2019-02-03 DIAGNOSIS — M7752 Other enthesopathy of left foot: Secondary | ICD-10-CM | POA: Diagnosis not present

## 2019-02-03 DIAGNOSIS — E08621 Diabetes mellitus due to underlying condition with foot ulcer: Secondary | ICD-10-CM

## 2019-02-03 DIAGNOSIS — R2240 Localized swelling, mass and lump, unspecified lower limb: Secondary | ICD-10-CM | POA: Diagnosis not present

## 2019-02-03 DIAGNOSIS — L97521 Non-pressure chronic ulcer of other part of left foot limited to breakdown of skin: Secondary | ICD-10-CM | POA: Diagnosis not present

## 2019-02-03 DIAGNOSIS — L97522 Non-pressure chronic ulcer of other part of left foot with fat layer exposed: Secondary | ICD-10-CM

## 2019-02-05 NOTE — Progress Notes (Signed)
   Subjective:  Patient presents today status post exostectomy left fifth toe. DOS: 01/20/2019. He states he is doing well. He reports some intermittent soreness of the toe. He has been using Medi-Honey and the post op shoe as directed. He denies any modifying factors. Patient is here for further evaluation and treatment.    Past Medical History:  Diagnosis Date  . Atrial flutter ([place])    s/p cardioversion  . Coronary artery disease   . Diabetes mellitus   . GERD (gastroesophageal reflux disease)   . History of nuclear stress test 04/04/2011   lexiscan; mod-large in size fixed inferolateral defect (scar); non-diagnostic for ischemia; low risk scan   . Hyperlipidemia   . Hypertension   . Left foot drop    r/t past disk srugery - uses Kevlar brace  . Myocardial infarction ([place])    posterior MI  . Shortness of breath   . Sleep apnea    on CPAP; 04/28/2007 split-night - AHI during total sleep 44.43/hr and REM 72.56/hr      Objective/Physical Exam Neurovascular status intact.  Skin incisions appear to be well coapted with sutures and staples intact. No sign of infectious process noted. No dehiscence. No active bleeding noted. Moderate edema noted to the surgical extremity. Wound noted to the left 2nd toe measuring 0.5 x 0.5 x 0.2 cm.   To the above-noted ulceration, there is no eschar. There is a moderate amount of slough, fibrin and necrotic tissue. Granulation tissue and wound base is red. There is no malodor. There is a minimal amount of serosanginous drainage noted. Periwound integrity is intact.   Assessment: 1. s/p exostectomy left fifth toe. DOS: 01/20/2019 2. Ulceration of the left 2nd toe secondary to venous insufficiency   Plan of Care:  1. Patient was evaluated.  2. Medically necessary excisional debridement including subcutaneous tissue was performed using a tissue nipper and a chisel blade. Excisional debridement of all the necrotic nonviable tissue down to healthy bleeding  viable tissue was performed with post-debridement measurements same as pre-. 3. The wound was cleansed and dry sterile dressing applied. 4. Sutures removed.  5. Recommended Silvasorb dressing daily to right 2nd toe ulceration.  6. Recommended good shoe gear.  7. Return to clinic in 2 months for routine care.     Edrick Kins, DPM [facility]  Dr. Edrick Kins, [address]                                        [place], [place] [address]                Office [phone]  Fax [phone]

## 2019-02-07 ENCOUNTER — Other Ambulatory Visit: Payer: Self-pay | Admitting: Family Medicine

## 2019-02-10 NOTE — Progress Notes (Signed)
OPERATIVE REPORT Patient name: SASUKE YAFFE MRN: [id number] DOB: 04/09/1951  DOS:  02/10/19  Preop Dx: Soft tissue mass left fifth toe.  Exostosis left fifth toe. Postop Dx: same  Procedure:  1.  Excision of soft tissue mass left fifth toe 2.  Exostectomy left fifth toe   Surgeon: Edrick Kins DPM  Anesthesia: 50-50 mixture of 2% lidocaine plain with 0.5% Marcaine plain totaling 6 mL infiltrated in the patient's left lower extremity  Hemostasis: Ankle tourniquet inflated to a pressure of 219mmHg after esmarch exsanguination   EBL: Minimal mL Materials: None Injectables: None Pathology: None  Condition: The patient tolerated the procedure and anesthesia well. No complications noted or reported   Justification for procedure: The patient is a 68 y.o. male who presents today for surgical correction of a symptomatic soft tissue mass to the lateral aspect of the left fifth toe as well as an underlying exostosis as noted on radiographic exam from prior x-ray. All conservative modalities of been unsuccessful in providing any sort of satisfactory alleviation of symptoms with the patient. The patient was told benefits as well as possible side effects of the surgery. The patient consented for surgical correction. The patient consent form was reviewed. All patient questions were answered. No guarantees were expressed or implied. The patient and the surgeon boson the patient consent form and entered in the patient's chart.   Procedure in Detail: The patient was brought to the in-office procedure room, placed in the procedure chair in the supine position at which time an aseptic scrub and drape were performed about the patient's respective lower extremity after anesthesia was induced as described above. Attention was then directed to the surgical area where procedure number one commenced.  Procedure #1: Excision soft tissue mass left fifth toe A 1.5 cm elliptical type incision was planned  and made overlying the soft tissue mass of the lateral aspect of the left fifth toe.  Incision was carried down to the level of bone and the entire soft tissue mass overlying the toe was removed in toto.  Evaluation of the skin edges indicated that the entire mass was removed.  This was a benign mass which was very symptomatic.  Procedure #2: Exostectomy left fifth toe After removal of the overlying soft tissue mass the osseous structures within the toe were evaluated in the hypertrophic portions of the fifth digit which were protruding and exacerbating the patient's symptoms were sharply removed using a bone cutter.  Any hypertrophic bone and sharp protuberance was resected away.  After excision of the bone irrigation was utilized in preparation for primary closure.  5-0 nylon suture was utilized to reapproximate superficial skin edges.  Dry sterile compressive dressings were then applied to all previously mentioned incision sites about the patient's lower extremity. The tourniquet which was used for hemostasis was deflated. All normal neurovascular responses including pink color and warmth returned all the digits of patient's lower extremity.  The patient was then discharged from the office wearing a postoperative shoe.  Prescription for analgesia sent to the pharmacy.  Verbal as well as written instructions were provided for the patient regarding wound care. The patient is to keep the dressings clean dry and intact until they are to follow surgeon Dr. Daylene Katayama in the office upon discharge.   Edrick Kins, DPM [facility]  Dr. Edrick Kins, DPM    [address]  [place], [place] [address]                Office [phone]  Fax [phone]

## 2019-02-12 ENCOUNTER — Other Ambulatory Visit: Payer: Self-pay

## 2019-02-12 ENCOUNTER — Ambulatory Visit (INDEPENDENT_AMBULATORY_CARE_PROVIDER_SITE_OTHER): Payer: Medicare Other | Admitting: Family Medicine

## 2019-02-12 DIAGNOSIS — Z23 Encounter for immunization: Secondary | ICD-10-CM | POA: Diagnosis not present

## 2019-02-12 NOTE — Telephone Encounter (Signed)
Called and schedule pft on 03/05/2019-pr

## 2019-02-21 ENCOUNTER — Other Ambulatory Visit: Payer: Self-pay | Admitting: Internal Medicine

## 2019-02-26 ENCOUNTER — Other Ambulatory Visit: Payer: Medicare Other

## 2019-02-26 DIAGNOSIS — E1165 Type 2 diabetes mellitus with hyperglycemia: Secondary | ICD-10-CM | POA: Diagnosis not present

## 2019-02-27 LAB — COMPREHENSIVE METABOLIC PANEL
AG Ratio: 2.3 (calc) (ref 1.0–2.5)
ALT: 19 U/L (ref 9–46)
AST: 16 U/L (ref 10–35)
Albumin: 3.7 g/dL (ref 3.6–5.1)
Alkaline phosphatase (APISO): 72 U/L (ref 35–144)
BUN: 15 mg/dL (ref 7–25)
CO2: 26 mmol/L (ref 20–32)
Calcium: 9.4 mg/dL (ref 8.6–10.3)
Chloride: 111 mmol/L — ABNORMAL HIGH (ref 98–110)
Creat: 1 mg/dL (ref 0.70–1.18)
Globulin: 1.6 g/dL (calc) — ABNORMAL LOW (ref 1.9–3.7)
Glucose, Bld: 115 mg/dL — ABNORMAL HIGH (ref 65–99)
Potassium: 4.5 mmol/L (ref 3.5–5.3)
Sodium: 146 mmol/L (ref 135–146)
Total Bilirubin: 0.6 mg/dL (ref 0.2–1.2)
Total Protein: 5.3 g/dL — ABNORMAL LOW (ref 6.1–8.1)

## 2019-02-27 LAB — HEMOGLOBIN A1C
Hgb A1c MFr Bld: 7.8 % of total Hgb — ABNORMAL HIGH (ref <5.7)
Mean Plasma Glucose: 177 (calc)
eAG (mmol/L): 9.8 (calc)

## 2019-03-03 ENCOUNTER — Other Ambulatory Visit: Payer: Self-pay

## 2019-03-03 ENCOUNTER — Other Ambulatory Visit (HOSPITAL_COMMUNITY)
Admission: RE | Admit: 2019-03-03 | Discharge: 2019-03-03 | Disposition: A | Discharge status: Home / Self Care | Payer: Medicare Other | Source: Ambulatory Visit | Attending: Internal Medicine | Admitting: Internal Medicine

## 2019-03-03 DIAGNOSIS — Z20828 Contact with and (suspected) exposure to other viral communicable diseases: Secondary | ICD-10-CM | POA: Insufficient documentation

## 2019-03-03 DIAGNOSIS — Z01812 Encounter for preprocedural laboratory examination: Secondary | ICD-10-CM | POA: Diagnosis not present

## 2019-03-03 LAB — SARS CORONAVIRUS 2 (TAT 6-24 HRS): SARS Coronavirus 2: NEGATIVE

## 2019-03-04 ENCOUNTER — Other Ambulatory Visit: Payer: Self-pay

## 2019-03-04 ENCOUNTER — Ambulatory Visit (INDEPENDENT_AMBULATORY_CARE_PROVIDER_SITE_OTHER): Payer: Medicare Other | Admitting: Family Medicine

## 2019-03-04 ENCOUNTER — Encounter: Payer: Self-pay | Admitting: Family Medicine

## 2019-03-04 VITALS — BP 126/68 | HR 54 | Temp 98.5°F | Resp 14 | Ht 72.0 in | Wt 225.0 lb

## 2019-03-04 DIAGNOSIS — E1165 Type 2 diabetes mellitus with hyperglycemia: Secondary | ICD-10-CM

## 2019-03-04 DIAGNOSIS — E785 Hyperlipidemia, unspecified: Secondary | ICD-10-CM | POA: Diagnosis not present

## 2019-03-04 DIAGNOSIS — I2510 Atherosclerotic heart disease of native coronary artery without angina pectoris: Secondary | ICD-10-CM

## 2019-03-04 DIAGNOSIS — I10 Essential (primary) hypertension: Secondary | ICD-10-CM

## 2019-03-04 NOTE — Progress Notes (Signed)
Subjective:    Patient ID: John [name], male    DOB: July 01, 1950, 68 y.o.   MRN: [id number]  HPI  Patient is here today for follow-up of his diabetes.  He is currently on Levemir 46 units daily.  His fasting blood sugars are outstanding.  He brings in over a months worth of readings.  His fasting blood sugars are always between 101 130.  The vast majority are between 110 and 127.  There are no hypoglycemic episodes.  However his 2-hour postprandial sugars are variable.  The majority are between 180 and 200.  Occasionally some are better.  However I believe this is why his hemoglobin A1c is still elevated at 7.8 although this is much better than last time.  He has a history of congestive heart failure.  He also has a history of atrial flutter as well as coronary artery disease.  Therefore his goal hemoglobin A1c would be less than 7.  His blood pressure today is outstanding at 126/68.  He denies any chest pain, shortness of breath, dyspnea on exertion.  He is already had his flu shot.  He denies any blurry vision.  He denies any neuropathy in his feet.  Diabetic foot exam was performed today and is normal Past Medical History:  Diagnosis Date  . Atrial flutter ([place])    s/p cardioversion  . Coronary artery disease   . Diabetes mellitus   . GERD (gastroesophageal reflux disease)   . History of nuclear stress test 04/04/2011   lexiscan; mod-large in size fixed inferolateral defect (scar); non-diagnostic for ischemia; low risk scan   . Hyperlipidemia   . Hypertension   . Left foot drop    r/t past disk srugery - uses Kevlar brace  . Myocardial infarction ([place])    posterior MI  . Shortness of breath   . Sleep apnea    on CPAP; 04/28/2007 split-night - AHI during total sleep 44.43/hr and REM 72.56/hr   Past Surgical History:  Procedure Laterality Date  . BACK SURGERY  1985  . CARDIAC CATHETERIZATION  2010   6 stents total  . CARDIAC CATHETERIZATION  01/2000   percutaneous transluminal  coronary balloon angioplasty of mid RCA stenotic lesion  . CARDIAC CATHETERIZATION  06/2006   no stenting; ischemic cardiomyopathy, EF 40-45%  . CARDIOVERSION N/A 07/28/2016   Procedure: CARDIOVERSION;  Surgeon: Troy Sine, MD;  Location: [facility];  Service: Cardiovascular;  Laterality: N/A;  . CORONARY ANGIOPLASTY  09/1998   mid-distal RCA balloon dilatation, 4.5 & 5.0 stents   . CORONARY ANGIOPLASTY WITH STENT PLACEMENT  03/1994   angioplasty & stenting (non-DES) of circumflex/prox ramus intermedius  . CORONARY ANGIOPLASTY WITH STENT PLACEMENT  10/1994   large iliac [id number] stent to RCA  . CORONARY ANGIOPLASTY WITH STENT PLACEMENT  12/2002   4.54mm stents x2 of RCA  . CORONARY ANGIOPLASTY WITH STENT PLACEMENT  01/2005   cutting balloon arthrectomy of distal RCA & Cypher DES 3.5x13; cutting balloon arthrectomy of mid RCA with Cypher DES 3.5x18  . CORONARY ANGIOPLASTY WITH STENT PLACEMENT  11/2008   stenting of mid RCA with 4.0x71mm driver, non-DES  . LEFT HEART CATHETERIZATION WITH CORONARY ANGIOGRAM N/A 02/27/2012   Procedure: LEFT HEART CATHETERIZATION WITH CORONARY ANGIOGRAM;  Surgeon: Lorretta Harp, MD;  Location: [facility] CATH LAB;  Service: Cardiovascular;  Laterality: N/A;  . TRANSTHORACIC ECHOCARDIOGRAM  07/29/2010   EF 50=55%, mod inf wall hypokinesis & mild post wall hypokinesis; LA mild-mod dilated; mild mitral annular  calcif & mild MR; mild TR & elevated RV systolic pressure; AV mildly sclerotic; mild aortic root dilatation    Current Outpatient Medications on File Prior to Visit  Medication Sig Dispense Refill  . albuterol (PROVENTIL HFA;VENTOLIN HFA) 108 (90 Base) MCG/ACT inhaler Inhale 2 puffs into the lungs every 6 (six) hours as needed for wheezing or shortness of breath. 1 Inhaler 6  . ARNUITY ELLIPTA 200 MCG/ACT AEPB TAKE 1 PUFF BY MOUTH EVERY DAY 30 each 5  . aspirin 81 MG tablet Take 81 mg by mouth daily.    [name] BYSTOLIC 5 MG tablet TAKE 1 TABLET BY MOUTH EVERY DAY 30 tablet 5   . ELIQUIS 5 MG TABS tablet TAKE 1 TABLET BY MOUTH TWICE A DAY 60 tablet 9  . ENTRESTO 49-51 MG TAKE 1 TABLET BY MOUTH TWICE A DAY 60 tablet 5  . Insulin Pen Needle (NOVOFINE) 32G X 6 MM MISC 1 each by Other route daily. 100 each 3  . JARDIANCE 25 MG TABS tablet TAKE 1 TABLET BY MOUTH EVERY DAY 30 tablet 5  . LEVEMIR FLEXTOUCH 100 UNIT/ML Pen INJECT 46 UNITS INTO THE SKIN DAILY. 15 mL 3  . montelukast (SINGULAIR) 10 MG tablet Take 1 tablet (10 mg total) by mouth daily. 90 tablet 3  . nitroGLYCERIN (NITROSTAT) 0.4 MG SL tablet PLACE 1 TABLET (0.4 MG TOTAL) UNDER THE TONGUE EVERY 5 (FIVE) MINUTES AS NEEDED FOR CHEST PAIN. 25 tablet 1  . ONETOUCH ULTRA test strip USE TO CHECK BLOOD SUGAR 3 TIMES A DAY (E11.9) 100 strip 2  . pantoprazole (PROTONIX) 40 MG tablet Take 1 tablet (40 mg total) by mouth daily. 30 tablet 3  . rosuvastatin (CRESTOR) 20 MG tablet TAKE 1 TABLET BY MOUTH EVERYDAY AT BEDTIME 30 tablet 10   No current facility-administered medications on file prior to visit.    Allergies  Allergen Reactions  . Benazepril Other (See Comments)    hyperkalemia  . Fish Allergy Itching  . Omega-3 Fatty Acids Hives and Itching  . Fish Oil Itching and Rash   Social History   Socioeconomic History  . Marital status: Married    Spouse name: Not on file  . Number of children: 3  . Years of education: Not on file  . Highest education level: Not on file  Occupational History  . 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 [place]  Social Needs  . Financial resource strain: Not on file  . Food insecurity    Worry: Not on file    Inability: Not on file  . Transportation needs    Medical: Not on file    Non-medical: Not on file  Tobacco Use  . Smoking status: Former Smoker    Packs/day: 1.00    Years: 50.00    Pack years: 50.00    Types: Cigarettes    Quit date: 08/04/2017    Years since quitting: 1.5  . Smokeless tobacco: Never Used  Substance and  Sexual Activity  . Alcohol use: Yes    Alcohol/week: 0.0 standard drinks    Comment: occasionally  . Drug use: No  . Sexual activity: Yes  Lifestyle  . Physical activity    Days per week: Not on file    Minutes per session: Not on file  . Stress: Not on file  Relationships  . Social Herbalist on phone: Not on file    Gets together: Not on  file    Attends religious service: Not on file    Active member of club or organization: Not on file    Attends meetings of clubs or organizations: Not on file    Relationship status: Not on file  . Intimate partner violence    Fear of current or ex partner: Not on file    Emotionally abused: Not on file    Physically abused: Not on file    Forced sexual activity: Not on file  Other Topics Concern  . Not on file  Social History Narrative  . Not on file      Review of Systems  All other systems reviewed and are negative.      Objective:   Physical Exam  Constitutional: He appears well-developed and well-nourished. No distress.  HENT:  Right Ear: External ear normal.  Left Ear: External ear normal.  Nose: Nose normal.  Mouth/Throat: Oropharynx is clear and moist. No oropharyngeal exudate.  Neck: Neck supple. No thyromegaly present.  Cardiovascular: Normal rate, regular rhythm and normal heart sounds.  No murmur heard. Pulmonary/Chest: Effort normal and breath sounds normal. No respiratory distress. He has no wheezes. He has no rales.  Abdominal: Soft. Bowel sounds are normal. He exhibits no distension and no mass. There is no abdominal tenderness. There is no rebound and no guarding.  Musculoskeletal:        General: No edema.  Lymphadenopathy:    He has no cervical adenopathy.  Skin: He is not diaphoretic.  Vitals reviewed.         Assessment & Plan:  Uncontrolled type 2 diabetes mellitus with hyperglycemia (HCC)  Hyperlipidemia LDL goal <70  Essential hypertension  Recommended adding mealtime insulin to  his Levemir.  Other option would be to add Trulicity or Ozempic.  However from a cost standpoint mealtime insulin would be his cheapest option.  I would recommend regular insulin 5 to 10 units prior to meals particularly lunch and supper.  However patient declines this option.  He would prefer to work better on his meals to reduce the carb intake to try to address his postprandial sugars to get them under 180.  His blood pressure today is under control.  His most recent fasting lipid panel showed an excellent LDL cholesterol.  His diabetic foot exam is up-to-date.  Patient denies any depression or falls.

## 2019-03-05 ENCOUNTER — Ambulatory Visit (INDEPENDENT_AMBULATORY_CARE_PROVIDER_SITE_OTHER): Payer: Medicare Other | Admitting: Internal Medicine

## 2019-03-05 ENCOUNTER — Encounter: Payer: Self-pay | Admitting: Internal Medicine

## 2019-03-05 VITALS — BP 122/68 | HR 60 | Temp 98.0°F | Ht 73.0 in | Wt 225.0 lb

## 2019-03-05 DIAGNOSIS — J849 Interstitial pulmonary disease, unspecified: Secondary | ICD-10-CM

## 2019-03-05 DIAGNOSIS — R911 Solitary pulmonary nodule: Secondary | ICD-10-CM | POA: Diagnosis not present

## 2019-03-05 DIAGNOSIS — I2510 Atherosclerotic heart disease of native coronary artery without angina pectoris: Secondary | ICD-10-CM | POA: Diagnosis not present

## 2019-03-05 LAB — PULMONARY FUNCTION TEST
DL/VA % pred: 94 %
DL/VA: 3.77 ml/min/mmHg/L
DLCO unc % pred: 93 %
DLCO unc: 26.65 ml/min/mmHg
FEF 25-75 Pre: 3.28 L/sec
FEF2575-%Pred-Pre: 118 %
FEV1-%Pred-Pre: 93 %
FEV1-Pre: 3.42 L
FEV1FVC-%Pred-Pre: 108 %
FEV6-%Pred-Pre: 90 %
FEV6-Pre: 4.22 L
FEV6FVC-%Pred-Pre: 104 %
FVC-%Pred-Pre: 86 %
FVC-Pre: 4.27 L
Pre FEV1/FVC ratio: 80 %
Pre FEV6/FVC Ratio: 99 %

## 2019-03-05 NOTE — Progress Notes (Signed)
Spiro/DLCO performed today. 

## 2019-03-05 NOTE — Patient Instructions (Addendum)
ILD (interstitial lung disease) ([place])  - improved breathing test but ILD still present on CT June 2019 -> May 2020 without change - given improvement in breathing test probably best we continue to monitor - do repeat spirometry and dlco in 6 months  New issue  - LUL lung nodule 1cm in may 2020  - do PET scan next 1 week  Followup - will call with PET scan results to decide next step  - otherwise return in 6 months with spirometry and dlco for 30 mint visit

## 2019-03-05 NOTE — Progress Notes (Signed)
HPI  IOV 11/06/2017  Chief Complaint  Patient presents with   Consult    Referred by Dr. Cindi Carbon for dyspnea on exertion X1 year.     68 year old former smoker who worked in a [employer] as a Freight forwarder without any metal dust or organic dust exposure.  Only exposure being cigarette smoking with a 50 pack smoking history quit earlier in 2019 and ongoing amiodarone.  He is known to have chronic systolic heart failure with ejection fraction around 40-45% although November 2018 stress test this at reduced to 35%.  He is reporting insidious onset of worsening shortness of breath on exertion since summer 2018 and it is progressive.  One year ago he could do hike [place] halfway with his granddaughter but currently he says doing yard work and moving across rooms makes him dyspneic and he has to stop.  There is a chest tightness in the shape of an inverted Y from the neck to the center of the chest but he says this is not chest pain.  In November 2018 he did have cardiac stress test and there is no ongoing active ischemia.  In March 2019 he reported to any pain emergency department and I reviewed this chart where he had hypertension related pulmonary congestion on chest x-ray.  This was medically treated in the emergency department and discharged since then he is says this is improved but dyspnea on exertion continues to get worse.  There is no orthopnea [he does use chronic stable CPAP for many years] or proximal nocturnal dyspnea or worsening edema.  In fact he does not have much edema at all.  Walking desaturation test today was normal to exam he did get mildly short of breath but no chest pain  Of note symptoms are associated with some nonspecific cough but no wheezing   Recent lab review  Known chronic systolic heart failure but December 2017 in November 2018 echocardiogram showing ejection fraction 40-45% but a nuclear medicine cardiac stress test November 2018 showing reduced ejection  fraction to 35%   Personal visualization of imaging March 2014 CT angiogram chest he seems to have a 5 mm nodule but otherwise clear lung field [the official radiology report does not comment about this nodule].  March 2019 chest x-ray shows cardiomegaly with congestive heart failure findings.  I personally visualized both these images   Recent labs include creatinine 1.24 mg percent with GFR 59 in May 2019 and a hemoglobin of 14.1 g% in March 2019 Exam nitric oxide test today in the office: 52 ppb and elevated  OV 12/05/2017  Chief Complaint  Patient presents with   Follow-up    HRCT performed 6/6 and PFT performed 6/13. Pt states he is about the same as he was at last visit. Pt states he still has some problems becoming SOB with exertion and states he will be seeing his cardiologist soon to see if they can find out the problem. Pt denies any coughing but states he has to clear his throat a lot and at times he has problem talking and becomes hoarse.    Follow-up progressive shortness of breath workup in the setting of chronic systolic heart failure that is deemed stable, previous heavy smoking history and amiodarone exposure  In this visit he reports that his shortness of breath is stable but in the interim has developed a cough. He tells me that the cough started 6 weeks ago just prior last visit which was 4 weeks  ago. He says this coincides withhis primary care physician putting him on an  [place] for his chronic systolic heart failure. In the past she's had ACE inhibitor cough. At last visit his exhaled nitric oxide was elevated greater than 50 ppb. His cough has continued despite Singulair and inhaled steroid. He clearly believes that the heart failure medication is causing his cough. He wants to come off it. In terms of his shortness of breath workup for interstitial lung disease he had pulmonary function test that shows restriction with low DLCO. The concomitant high-resolution CT scan of  the chest-show evidence of ILD. I personally visualized the findings and confirmed that it is indeed present and it is new since 2014.. I agree with the thoracic radiologist findings of this is indeterminate but having said that it has a bibasal subpleural reticular pattern. This is not much of groundglass opacities. This brings the probability that this is UIP less than 50%. He denies any autoimmune findings.  [facility] -  specific interstitial lung disease questionnaire  SymptomS:reports insidious onset of shortness of breath for the last 1 year. It is progressive since it started. He rates it as a level IV while eating brushing the teeth or shaving and a level III while showering or dressing and the level for 4 dishes a laundry and a level III for walking on level at his own pace and shopping and the level fall while watering the lawn. He has a cough for the last 6 weeks it is dry. This no associated wheezing. There is associated clearing of throat  Past medical history: Positive for heart failureand history of asthma and negative autoimmune or collagen vascular disease. Positive for sleep apnea for which he is on CPAP. Positive for diabetes  Review of systems: Positive for arthralgia and fatigue but negative for any sicca syndromes Raynaud  Family history of pulmonary issues: Negative for pulmonary fibrosis cystic fibrosis, COPD or fever  MRSA exposure history: Previous smoker. Negative for cocaine or IV drug abuse. [place] the [place].  Occupational history: He lives in a condition space but otherwise extensive occupational history is negative  Medication history positive for amiodarone but otherwise extensive medication history is negative       OV 03/14/2018  Subjective:  Patient ID: John [name], male , DOB: 1950/12/18 , age 60 y.o. , MRN: [id number] , ADDRESS: [address] [place] [address]   03/14/2018 -   Chief  Complaint  Patient presents with   Follow-up    breathing improved since using nebulizer but remains concerned about hoarseness.       HPI DJIBRIL GLOGOWSKI 68 y.o. -follow-up idiopathic ILD associated with trace positive ANA, double-stranded DNA and slightly elevated CK-MB level but normal CK.  Associated with amiodarone intake.  New onset CT findings in June 2019 compared to 2014  Since I last saw him in June 2019 he has stopped taking amiodarone.  This history is determined by review of the chart because he himself was not sure.  He says his dyspnea and effort tolerance is improved.  In fact when we walked him he did not have dyspnea on exertion like he did last time.  But heart rate response and pulse ox response were normal as before.  He had pulmonary function test today with a DLCO is unchanged but the FVC is improved.  I noticed that he continues to be on statin which he probably  needs because of his 11 cardiac stents.  He has seen Dr. Claiborne Billings recently.  I reviewed that note.      OV 03/05/2019  Subjective:  Patient ID: John [name], male , DOB: Oct 19, 1950 , age 1 y.o. , MRN: [id number] , ADDRESS: [address] [place] [address]   03/05/2019 -   Chief Complaint  Patient presents with   ILD (interstitial lung disease)    Discuss PFT results   follow-up idiopathic ILD associated with trace positive ANA, double-stranded DNA (June 2019 and sept 2019) and slightly elevated CK-MB (chronic 2008 - sept 2019) level but normal CK. .  New onset CT findings in June 2019 compared to 2014.  Associated with amiodarone intake  - stopped June-sept 2019   HPI YAHMIR SOKOLOV 68 y.o. -presents for ILD follow-up.  Since visit in September 2019 there has been delay because of the national pandemic.  He is following up now.  In the interim he continues to feel better.  He has very minimal dyspnea.  He is not using oxygen.  He had repeat pulmonary function test that shows improvement.  He had a  high-resolution CT chest in May 2020 that shows persistence of ILD although paradoxically his pulmonary function tests are improved.  Overall he has very minimal symptoms.  Walking desaturation test today shows stability with no desaturation.  He is not taking any amiodarone.  New issue is that he has a left upper lobe nodule -I visualized the film and confirmed the findings.  This was not there 1 year ago.  He is not having any hemoptysis.    Simple office walk 185 feet x  3 laps goal with forehead probe 11/06/2017  03/14/2018  03/05/2019   O2 used Room air Room air Room air  Number laps completed All 3 All 3 All 3  Comments about pace No comment Good pace Good pace  Resting Pulse Ox/HR 98% and 50/min 98% and 53/min 99% and 56/min  Final Pulse Ox/HR 96% and 57/min 97% and 73/min 97% and 80,om  Desaturated </= 88% no no no  Desaturated <= 3% points no no no  Got Tachycardic >/= 90/min no no no  Symptoms at end of test Mild dyspnea No complaints No complaints  Miscellaneous comments No comment Tolerated well x   Results for TERRACE, FONTANILLA (MRN [id number]) as of 03/05/2019 11:51  Ref. Range 11/29/2017 10:46 03/14/2018 15:44 03/05/2019 10:57  FVC-Pre Latest Units: L 3.88 4.29 4.27  FVC-%Pred-Pre Latest Units: % 77 86 86  Results for KAMARIE, PALMA (MRN [id number]) as of 03/05/2019 11:51  Ref. Range 11/29/2017 10:46 03/14/2018 15:44 03/05/2019 10:57  DLCO unc Latest Units: ml/min/mmHg 19.16 19.62 26.65  DLCO unc % pred Latest Units: % 52 53 93    Results for HERLEY, BERNARDINI (MRN [id number]) as of 03/05/2019 11:51  Ref. Range 12/05/2017 11:00 01/22/2018 10:40 01/22/2018 15:54 03/14/2018 16:58  ds DNA Ab Latest Units: IU/mL 14 (H)   15 (H)  dsDNA Ab Latest Ref Range: 0 - 9 IU/mL    18 (H)   IMPRESSION: HRCT  Lungs/Pleura: No change in minimal subpleural reticular interstitial pulmonary opacity. There is a new 1.0 cm pulmonary nodule of the left upper lobe (series 7, image 55). There is a stable  benign 6 mm fissural pulmonary nodule of the right middle lobe. Additional small, stable benign pulmonary nodules are present bilaterally, for example a 3 mm nodule of the left lower lobe (series 7, image  77). No pleural effusion or pneumothorax.   1. No change in minimal subpleural reticular interstitial pulmonary opacity, which remains "indeterminate for UIP (early)" by ATS pulmonary fibrosis criteria. Consider ongoing CT follow-up to evaluate for stability of fibrosis and pattern if indicated by clinical concern.  2. There is a new 1.0 cm pulmonary nodule of the left upper lobe (series 7, image 55), concerning for malignancy. Given size and location, this is amenable to both PET-CT and percutaneous CT-guided biopsy. At minimum, recommend follow-up CT in 3 months.  3.  Additional stable, benign small pulmonary nodules.  4.  Coronary artery disease.   Electronically Signed   By: Eddie Candle M.D.   On: 11/07/2018 12:50 ROS - per HPI     has a past medical history of Atrial flutter ([place]), Coronary artery disease, Diabetes mellitus, GERD (gastroesophageal reflux disease), History of nuclear stress test (04/04/2011), Hyperlipidemia, Hypertension, Left foot drop, Myocardial infarction [facility]), Shortness of breath, and Sleep apnea.   reports that he quit smoking about 19 months ago. His smoking use included cigarettes. He has a 50.00 pack-year smoking history. He has never used smokeless tobacco.  Past Surgical History:  Procedure Laterality Date   [place]  2010   6 stents total   CARDIAC CATHETERIZATION  01/2000   percutaneous transluminal coronary balloon angioplasty of mid RCA stenotic lesion   CARDIAC CATHETERIZATION  06/2006   no stenting; ischemic cardiomyopathy, EF 40-45%   CARDIOVERSION N/A 07/28/2016   Procedure: CARDIOVERSION;  Surgeon: Troy Sine, MD;  Location: [facility];  Service: Cardiovascular;  Laterality: N/A;     CORONARY ANGIOPLASTY  09/1998   mid-distal RCA balloon dilatation, 4.5 & 5.0 stents    CORONARY ANGIOPLASTY WITH STENT PLACEMENT  03/1994   angioplasty & stenting (non-DES) of circumflex/prox ramus intermedius   CORONARY ANGIOPLASTY WITH STENT PLACEMENT  10/1994   large iliac [id number] stent to RCA   [place]  12/2002   4.70mm stents x2 of RCA   CORONARY ANGIOPLASTY WITH STENT PLACEMENT  01/2005   cutting balloon arthrectomy of distal RCA & Cypher DES 3.5x13; cutting balloon arthrectomy of mid RCA with Cypher DES 3.5x18   CORONARY ANGIOPLASTY WITH STENT PLACEMENT  11/2008   stenting of mid RCA with 4.0x56mm driver, non-DES   LEFT HEART CATHETERIZATION WITH CORONARY ANGIOGRAM N/A 02/27/2012   Procedure: LEFT HEART CATHETERIZATION WITH CORONARY ANGIOGRAM;  Surgeon: Lorretta Harp, MD;  Location: [facility] CATH LAB;  Service: Cardiovascular;  Laterality: N/A;   TRANSTHORACIC ECHOCARDIOGRAM  07/29/2010   EF 50=55%, mod inf wall hypokinesis & mild post wall hypokinesis; LA mild-mod dilated; mild mitral annular calcif & mild MR; mild TR & elevated RV systolic pressure; AV mildly sclerotic; mild aortic root dilatation     Allergies  Allergen Reactions   Benazepril Other (See Comments)    hyperkalemia   Fish Allergy Itching   Omega-3 Fatty Acids Hives and Itching   Fish Oil Itching and Rash    Immunization History  Administered Date(s) Administered   Fluad Quad(high Dose 65+) 02/12/2019   Influenza, High Dose Seasonal PF 03/15/2017, 03/14/2018   Influenza, Seasonal, Injecte, Preservative Fre 04/07/2013   Influenza,inj,Quad PF,6+ Mos 06/04/2014, 04/08/2015, 04/18/2016   Pneumococcal Conjugate-13 01/02/2014   Pneumococcal Polysaccharide-23 01/07/2016   Tdap 12/29/2012    Family History  Problem Relation Age of Onset   Heart attack Father      Current Outpatient Medications:    albuterol (PROVENTIL  HFA;VENTOLIN HFA) 108 (90 Base) MCG/ACT  inhaler, Inhale 2 puffs into the lungs every 6 (six) hours as needed for wheezing or shortness of breath., Disp: 1 Inhaler, Rfl: 6   ARNUITY ELLIPTA 200 MCG/ACT AEPB, TAKE 1 PUFF BY MOUTH EVERY DAY, Disp: 30 each, Rfl: 5   aspirin 81 MG tablet, Take 81 mg by mouth daily., Disp: , Rfl:    BYSTOLIC 5 MG tablet, TAKE 1 TABLET BY MOUTH EVERY DAY, Disp: 30 tablet, Rfl: 5   ELIQUIS 5 MG TABS tablet, TAKE 1 TABLET BY MOUTH TWICE A DAY, Disp: 60 tablet, Rfl: 9   ENTRESTO 49-51 MG, TAKE 1 TABLET BY MOUTH TWICE A DAY, Disp: 60 tablet, Rfl: 5   Insulin Pen Needle (NOVOFINE) 32G X 6 MM MISC, 1 each by Other route daily., Disp: 100 each, Rfl: 3   JARDIANCE 25 MG TABS tablet, TAKE 1 TABLET BY MOUTH EVERY DAY, Disp: 30 tablet, Rfl: 5   LEVEMIR FLEXTOUCH 100 UNIT/ML Pen, INJECT 46 UNITS INTO THE SKIN DAILY., Disp: 15 mL, Rfl: 3   montelukast (SINGULAIR) 10 MG tablet, Take 1 tablet (10 mg total) by mouth daily., Disp: 90 tablet, Rfl: 3   nitroGLYCERIN (NITROSTAT) 0.4 MG SL tablet, PLACE 1 TABLET (0.4 MG TOTAL) UNDER THE TONGUE EVERY 5 (FIVE) MINUTES AS NEEDED FOR CHEST PAIN., Disp: 25 tablet, Rfl: 1   ONETOUCH ULTRA test strip, USE TO CHECK BLOOD SUGAR 3 TIMES A DAY (E11.9), Disp: 100 strip, Rfl: 2   pantoprazole (PROTONIX) 40 MG tablet, Take 1 tablet (40 mg total) by mouth daily., Disp: 30 tablet, Rfl: 3   rosuvastatin (CRESTOR) 20 MG tablet, TAKE 1 TABLET BY MOUTH EVERYDAY AT BEDTIME, Disp: 30 tablet, Rfl: 10      Objective:   Vitals:   03/05/19 1145  BP: 122/68  Pulse: 60  Temp: 98 F (36.7 C)  SpO2: 97%  Weight: 225 lb (102.1 kg)  Height: 6\' 1"  (1.854 m)    Estimated body mass index is 29.69 kg/m as calculated from the following:   Height as of this encounter: 6\' 1"  (1.854 m).   Weight as of this encounter: 225 lb (102.1 kg).  @WEIGHTCHANGE @  [employer]   03/05/19 1145  Weight: 225 lb (102.1 kg)     Physical Exam  General Appearance:    Alert, cooperative, no distress,  appears stated age - yes , Deconditioned looking - no , OBESE  - no, Sitting on Wheelchair -  no  Head:    Normocephalic, without obvious abnormality, atraumatic  Eyes:    PERRL, conjunctiva/corneas clear,  Ears:    Normal TM's and external ear canals, both ears  Nose:   Nares normal, septum midline, mucosa normal, no drainage    or sinus tenderness. OXYGEN ON  - no . Patient is @ ra   Throat:   Lips, mucosa, and tongue normal; teeth and gums normal. Cyanosis on lips - no  Neck:   Supple, symmetrical, trachea midline, no adenopathy;    thyroid:  no enlargement/tenderness/nodules; no carotid   bruit or JVD  Back:     Symmetric, no curvature, ROM normal, no CVA tenderness  Lungs:     Distress - no , Wheeze no, Barrell Chest - no, Purse lip breathing - no, Crackles - no   Chest Wall:    No tenderness or deformity.    Heart:    Regular rate and rhythm, S1 and S2 normal, no rub   or gallop, Murmur -  no  Breast Exam:    NOT DONE  Abdomen:     Soft, non-tender, bowel sounds active all four quadrants,    no masses, no organomegaly. Visceral obesity - mild  Genitalia:   NOT DONE  Rectal:   NOT DONE  Extremities:   Extremities - normal, Has Cane - no, Clubbing - no, Edema - no  Pulses:   2+ and symmetric all extremities  Skin:   Stigmata of Connective Tissue Disease - no  Lymph nodes:   Cervical, supraclavicular, and axillary nodes normal  Psychiatric:  Neurologic:   Pleasant - yes, Anxious - no, Flat affect - no  CAm-ICU - neg, Alert and Oriented x 3 - yes, Moves all 4s - yes, Speech - normal, Cognition - intact           Assessment:       ICD-10-CM   1. ILD (interstitial lung disease) ([place])  J84.9   2. Solitary pulmonary nodule  R91.1        Plan:     Patient Instructions  ILD (interstitial lung disease) ([place])  - improved breathing test but ILD still present on CT June 2019 -> May 2020 without change - given improvement in breathing test probably best we continue to monitor -  do repeat spirometry and dlco in 6 months  New issue  - LUL lung nodule 1cm in may 2020  - do PET scan next 1 week  Followup - will call with PET scan results to decide next step  - otherwise return in 6 months with spirometry and dlco for 30 mint visit     SIGNATURE    Dr. Brand Males, M.D., F.C.C.P,  Pulmonary and Critical Care Medicine Staff Physician, [place] Director - Interstitial Lung Disease  Program  Pulmonary [place] at [place], [place], [address]  Pager: [phone], If no answer or between  15:00h - 7:00h: call [phone] Telephone: [phone]  12:18 PM 03/05/2019

## 2019-03-06 NOTE — Addendum Note (Signed)
Addended by: Nena Polio on: 03/06/2019 09:38 AM   Modules accepted: Orders

## 2019-03-12 ENCOUNTER — Encounter (HOSPITAL_COMMUNITY): Payer: Medicare Other

## 2019-03-12 ENCOUNTER — Telehealth: Payer: Self-pay | Admitting: Internal Medicine

## 2019-03-12 ENCOUNTER — Other Ambulatory Visit: Payer: Self-pay

## 2019-03-12 ENCOUNTER — Encounter (HOSPITAL_COMMUNITY)
Admission: RE | Admit: 2019-03-12 | Discharge: 2019-03-12 | Disposition: A | Discharge status: Home / Self Care | Payer: Medicare Other | Source: Ambulatory Visit | Attending: Internal Medicine | Admitting: Internal Medicine

## 2019-03-12 DIAGNOSIS — I2510 Atherosclerotic heart disease of native coronary artery without angina pectoris: Secondary | ICD-10-CM | POA: Insufficient documentation

## 2019-03-12 DIAGNOSIS — R911 Solitary pulmonary nodule: Secondary | ICD-10-CM | POA: Diagnosis not present

## 2019-03-12 LAB — GLUCOSE, CAPILLARY: Glucose-Capillary: 129 mg/dL — ABNORMAL HIGH (ref 70–99)

## 2019-03-12 MED ORDER — FLUDEOXYGLUCOSE F - 18 (FDG) INJECTION
11.0800 | Freq: Once | INTRAVENOUS | Status: AC | PRN
  Administered 2019-03-12: 11.08 via INTRAVENOUS

## 2019-03-12 NOTE — Telephone Encounter (Signed)
   Let [name] know that   A) nodule is still there after 4 months. And given persistence there is conern for early lung cancer  B) however, PET scan does NOT show uptake - so low probability is cancer and if cancer is EARLY STAGE.   C) next steps: do we have the NODIFY blood test - I think we do. IF SO, like to get it done next 1 week or so. ([email or web address]) .    D) I am hoping with blood work we can avoid biopsy but just keep with doing repeat CT scan  Thanks  MR     Nm Pet Image Initial (pi) Skull Base To Thigh  Result Date: 03/12/2019 CLINICAL DATA:  Initial treatment strategy for lung nodule. EXAM: NUCLEAR MEDICINE PET SKULL BASE TO THIGH TECHNIQUE: 11.1 mCi F-18 FDG was injected intravenously. Full-ring PET imaging was performed from the skull base to thigh after the radiotracer. CT data was obtained and used for attenuation correction and anatomic localization. Fasting blood glucose: 129 mg/dl COMPARISON:  CT chest 11/07/2018, 11/22/2017 and 08/19/2012. FINDINGS: Mediastinal blood pool activity: SUV max 2.8 Liver activity: SUV max NA NECK: No abnormal hypermetabolism. Incidental CT findings: None. CHEST: No hypermetabolic mediastinal, hilar or axillary lymph nodes. 11 mm lingular nodule has an SUV max of 1.4. No additional hypermetabolic pulmonary nodules. Incidental CT findings: Atherosclerotic calcification of the aorta and coronary arteries. Heart is enlarged. No pericardial or pleural effusion. 6 mm nodule along the minor fissure is too small for PET resolution may represent a subpleural lymph node. ABDOMEN/PELVIS: No abnormal hypermetabolism in the liver, adrenal glands, spleen or pancreas. No hypermetabolic lymph nodes. Incidental CT findings: Liver, gallbladder and adrenal glands are unremarkable. Low-attenuation lesion in the upper pole right kidney measures 3.7 cm and is likely a cyst. Kidneys, spleen,  pancreas, stomach and bowel are otherwise unremarkable. Prostate is mildly enlarged. SKELETON: No abnormal osseous hypermetabolism. Incidental CT findings: Degenerative changes in the spine. IMPRESSION: 1. Lingular nodule does not show abnormal hypermetabolism but is at the size threshold for PET resolution. CT morphology, persistence from 11/07/2018 and absence on 11/22/2017 all favor a small adenocarcinoma. 2. Aortic atherosclerosis (ICD10-170.0). Coronary artery calcification. Electronically Signed   By: Lorin Picket M.D.   On: 03/12/2019 12:17    has a past medical history of Atrial flutter ([place]), Coronary artery disease, Diabetes mellitus, GERD (gastroesophageal reflux disease), History of nuclear stress test (04/04/2011), Hyperlipidemia, Hypertension, Left foot drop, Myocardial infarction [facility]), Shortness of breath, and Sleep apnea.    has a past surgical history that includes Back surgery (1985); transthoracic echocardiogram (07/29/2010); Cardiac catheterization (2010); Coronary angioplasty with stent (03/1994); Coronary angioplasty with stent (10/1994); Coronary angioplasty (09/1998); Cardiac catheterization (01/2000); Coronary angioplasty with stent (12/2002); Coronary angioplasty with stent (01/2005); Cardiac catheterization (06/2006); Coronary angioplasty with stent (11/2008); left heart catheterization with coronary angiogram (N/A, 02/27/2012); and Cardioversion (N/A, 07/28/2016).

## 2019-03-13 ENCOUNTER — Ambulatory Visit: Payer: Medicare Other | Admitting: Podiatry

## 2019-03-13 NOTE — Telephone Encounter (Signed)
Called and spoke to pt. Informed him of the results and recs per MR. Pt verbalized understanding. Pt states he will come on 9/28 from 0900-1100 to do the Nodify blood test. Spoke with Irma and she is aware of the blood work needed. Forms have been completed and Burman Nieves has been made aware.

## 2019-03-14 ENCOUNTER — Ambulatory Visit (HOSPITAL_COMMUNITY): Payer: Medicare Other

## 2019-03-17 ENCOUNTER — Encounter: Payer: Self-pay | Admitting: Internal Medicine

## 2019-03-17 ENCOUNTER — Other Ambulatory Visit: Payer: Medicare Other

## 2019-03-18 NOTE — Telephone Encounter (Signed)
Patient came in yesterday for labwork.  Nothing further needed at this time- will close encounter.

## 2019-03-24 ENCOUNTER — Ambulatory Visit (INDEPENDENT_AMBULATORY_CARE_PROVIDER_SITE_OTHER): Payer: Medicare Other | Admitting: Podiatry

## 2019-03-24 ENCOUNTER — Other Ambulatory Visit: Payer: Self-pay

## 2019-03-24 DIAGNOSIS — L97521 Non-pressure chronic ulcer of other part of left foot limited to breakdown of skin: Secondary | ICD-10-CM

## 2019-03-24 DIAGNOSIS — L97522 Non-pressure chronic ulcer of other part of left foot with fat layer exposed: Secondary | ICD-10-CM | POA: Diagnosis not present

## 2019-03-24 DIAGNOSIS — E08621 Diabetes mellitus due to underlying condition with foot ulcer: Secondary | ICD-10-CM

## 2019-03-24 DIAGNOSIS — M79676 Pain in unspecified toe(s): Secondary | ICD-10-CM | POA: Diagnosis not present

## 2019-03-24 DIAGNOSIS — B351 Tinea unguium: Secondary | ICD-10-CM

## 2019-03-24 DIAGNOSIS — R911 Solitary pulmonary nodule: Secondary | ICD-10-CM | POA: Diagnosis not present

## 2019-03-25 ENCOUNTER — Telehealth: Payer: Self-pay | Admitting: Internal Medicine

## 2019-03-25 DIAGNOSIS — R911 Solitary pulmonary nodule: Secondary | ICD-10-CM

## 2019-03-25 NOTE — Telephone Encounter (Signed)
Will route to [name] , results are in his look at . He will review and be back in touch with pt.

## 2019-03-25 NOTE — Telephone Encounter (Signed)
Called and spoke w/ pt. Pt states he had labwork done 03/17/2019 in our office that required a "special kit" that would not be available for results until 7 days later. I let him know we do not have any labs on file for him for 03/17/2019, but that I would look into this. Pt verbalized understanding.   I determined pt was speaking of [facility], a lab that is not in Epic and is resulted by fax. I checked MR's incoming mailbox and found the results for pt for 03/17/2019.   Called and spoke w/ pt to let him know we have received the [facility] report. I also let him know that since MR is not in the office today, we will have to have our provider of the day review it. Pt expressed understanding.   TP, please advise. I am handing you the paperwork for these results for pt and routing this message to you. Thank you.

## 2019-03-26 NOTE — Telephone Encounter (Signed)
Called and spoke to patient.  Let patient know that [name] will be back in the office on 03/31/2019. Patient stated that he has been anxiously awaiting these results and has been worried he would miss the call.  Advised patient that we will call him as soon as we here back from [name].

## 2019-03-26 NOTE — Telephone Encounter (Signed)
Pt calling about lab results.  [phone].

## 2019-03-27 NOTE — Progress Notes (Signed)
   Subjective:  Patient presents today status post exostectomy left fifth toe. DOS: 01/20/2019. He is here for follow up evaluation of an ulceration of the left 2nd toe. He states he is doing well. He has been using the silicone toe spacer as directed. There are no modifying factors noted.  He also complains of elongated, thickened nails 1-5 bilaterally that cause pain while ambulating in shoes. He is unable to trim his own nail. Patient is here for further evaluation and treatment.    Past Medical History:  Diagnosis Date  . Atrial flutter ([place])    s/p cardioversion  . Coronary artery disease   . Diabetes mellitus   . GERD (gastroesophageal reflux disease)   . History of nuclear stress test 04/04/2011   lexiscan; mod-large in size fixed inferolateral defect (scar); non-diagnostic for ischemia; low risk scan   . Hyperlipidemia   . Hypertension   . Left foot drop    r/t past disk srugery - uses Kevlar brace  . Myocardial infarction ([place])    posterior MI  . Shortness of breath   . Sleep apnea    on CPAP; 04/28/2007 split-night - AHI during total sleep 44.43/hr and REM 72.56/hr      Objective/Physical Exam Neurovascular status intact.  Skin incisions appear to be well coapted. No sign of infectious process noted. No dehiscence. No active bleeding noted. Moderate edema noted to the surgical extremity. Wound noted to the left 2nd toe measuring 0.4 x 0.4 x 0.2 cm.   To the above-noted ulceration, there is no eschar. There is a moderate amount of slough, fibrin and necrotic tissue. Granulation tissue and wound base is red. There is no malodor. There is a minimal amount of serosanginous drainage noted. Periwound integrity is intact.  Nails are tender, long, thickened and dystrophic with subungual debris, consistent with onychomycosis, 1-5 bilateral.   Assessment: 1. Ulceration of the left 2nd toe secondary to venous insufficiency 2. Diabetes Mellitus w/ peripheral neuropathy 3.  Onychomycosis of nail due to dermatophyte bilateral   Plan of Care:  1. Patient was evaluated.  2. Medically necessary excisional debridement including subcutaneous tissue was performed using a tissue nipper and a chisel blade. Excisional debridement of all the necrotic nonviable tissue down to healthy bleeding viable tissue was performed with post-debridement measurements same as pre-. 3. The wound was cleansed and dry sterile dressing applied. 4. Mechanical debridement of nails 1-5 bilaterally performed using a nail nipper. Filed with dremel without incident.  5. Continue using silicone toe spacer. 6. Return to clinic in 6 months.     Edrick Kins, DPM [facility]  Dr. Edrick Kins, [address]                                        [place], [place] [address]                Office [phone]  Fax [phone]

## 2019-03-31 NOTE — Telephone Encounter (Signed)
The blood test result shows a low probability that this nodule is lung cancer.  Please note that this is low probability and nonzero probability.  The blood test support my clinical suspicion that this is low odds this is cancer.  This means instead of doing a biopsy now we can probably watch it   Plan  = Repeat CT scan of the chest super dimension protocol mid December 2020 which will be 3 months since the PET scan - and and he should see Dr. Leory Plowman Icard or Dr. Baltazar Apo at that point  -However, if he is anxious and cannot wait that long he should see Dr. Leory Plowman Icard or Dr. Baltazar Apo for a second opinion sooner     SIGNATURE    Dr. Brand Males, M.D., F.C.C.P,  Pulmonary and Critical Care Medicine Staff Physician, [place] Director - Interstitial Lung Disease  Program  Pulmonary [place] at [place], [place], [address]  Pager: [phone], If no answer or between  15:00h - 7:00h: call [phone] Telephone: [phone]  1:46 PM 03/31/2019

## 2019-03-31 NOTE — Telephone Encounter (Signed)
[name] please advise. Thank you!

## 2019-03-31 NOTE — Telephone Encounter (Signed)
I called the patient to advise him of the response received from [name] and he agreed to have the Chest CT Super D in December with office visit to discuss.  [name], did you want the CT with or without contrast?

## 2019-04-01 NOTE — Telephone Encounter (Signed)
Order sent to [facility] 

## 2019-04-01 NOTE — Telephone Encounter (Signed)
Super D with or with/out contrast?

## 2019-04-01 NOTE — Telephone Encounter (Signed)
CT Super D will be in December 2020 and to see Byrum or Icard at that time/  Please try to get it sometime between Dec 5-10, 2020

## 2019-04-01 NOTE — Telephone Encounter (Signed)
Super D without contrast

## 2019-04-04 ENCOUNTER — Telehealth: Payer: Self-pay | Admitting: Internal Medicine

## 2019-04-07 NOTE — Telephone Encounter (Signed)
atc fast busy signal

## 2019-04-10 NOTE — Telephone Encounter (Signed)
ATC unable to reach, can't see why we might have called patient.

## 2019-04-11 NOTE — Telephone Encounter (Signed)
Were any of you trying to reach this patient regarding the scheduling of any testing? There was an order for Super D Chest CT dated 04/01/19.

## 2019-04-11 NOTE — Telephone Encounter (Signed)
Pt returning call.  [phone].

## 2019-04-11 NOTE — Telephone Encounter (Signed)
I have spoken to pt & LM for pt regarding his Super D CT & appt w/ Dr. Valeta Harms the day after.  I have also mailed pt this appt info.

## 2019-04-11 NOTE — Telephone Encounter (Signed)
ATC patient. LMTCB.  Unsure why someone tried contacting Patient.  No telephone note.

## 2019-04-17 ENCOUNTER — Other Ambulatory Visit: Payer: Self-pay | Admitting: Cardiovascular Disease

## 2019-04-29 ENCOUNTER — Telehealth: Payer: Self-pay | Admitting: Internal Medicine

## 2019-04-29 DIAGNOSIS — R911 Solitary pulmonary nodule: Secondary | ICD-10-CM

## 2019-04-29 NOTE — Telephone Encounter (Signed)
Need new order place due to insurance John [name]

## 2019-04-29 NOTE — Telephone Encounter (Signed)
Done

## 2019-04-30 ENCOUNTER — Other Ambulatory Visit: Payer: Self-pay | Admitting: *Deleted

## 2019-04-30 DIAGNOSIS — R911 Solitary pulmonary nodule: Secondary | ICD-10-CM

## 2019-05-05 ENCOUNTER — Other Ambulatory Visit: Payer: Self-pay | Admitting: Family Medicine

## 2019-05-07 ENCOUNTER — Ambulatory Visit (HOSPITAL_COMMUNITY)
Admission: RE | Admit: 2019-05-07 | Discharge: 2019-05-07 | Disposition: A | Discharge status: Home / Self Care | Payer: Medicare Other | Source: Ambulatory Visit | Attending: Internal Medicine | Admitting: Internal Medicine

## 2019-05-07 ENCOUNTER — Other Ambulatory Visit: Payer: Self-pay

## 2019-05-07 DIAGNOSIS — R918 Other nonspecific abnormal finding of lung field: Secondary | ICD-10-CM | POA: Diagnosis not present

## 2019-05-07 DIAGNOSIS — R911 Solitary pulmonary nodule: Secondary | ICD-10-CM | POA: Diagnosis not present

## 2019-05-09 ENCOUNTER — Encounter: Payer: Self-pay | Admitting: Pulmonary Disease

## 2019-05-09 ENCOUNTER — Ambulatory Visit (INDEPENDENT_AMBULATORY_CARE_PROVIDER_SITE_OTHER): Payer: Medicare Other | Admitting: Pulmonary Disease

## 2019-05-09 ENCOUNTER — Other Ambulatory Visit: Payer: Self-pay

## 2019-05-09 VITALS — BP 118/72 | HR 60 | Temp 98.9°F | Ht 72.0 in | Wt 222.2 lb

## 2019-05-09 DIAGNOSIS — Z87891 Personal history of nicotine dependence: Secondary | ICD-10-CM | POA: Diagnosis not present

## 2019-05-09 DIAGNOSIS — Z79899 Other long term (current) drug therapy: Secondary | ICD-10-CM | POA: Diagnosis not present

## 2019-05-09 DIAGNOSIS — R911 Solitary pulmonary nodule: Secondary | ICD-10-CM

## 2019-05-09 NOTE — Patient Instructions (Signed)
Thank you for visiting Dr. Valeta Harms at [facility]. Today we recommend the following:  Orders Placed This Encounter  Procedures  . CT Super D Chest Wo Contrast   Return in about 3 months (around 08/09/2019) for with APP or Dr. Valeta Harms.    Please do your part to reduce the spread of COVID-19.

## 2019-05-09 NOTE — Progress Notes (Signed)
Synopsis: Referred in November 2020 for nodule evaluation by [name], MD  Subjective:   PATIENT ID: John [name] GENDER: male DOB: 02/24/1951, MRN: [id number]  Chief Complaint  Patient presents with  . Follow-up    Here to discuss CT results.     This is a 68 year old gentleman 54-pack-year history of smoking, underwent CT scan imaging which revealed a left upper lobe lung nodule followed by [name].  Patient ultimately underwent PET scan imaging in September which revealed low-level PET uptake, SUV 1.4 within the nodule.  And continued follow-up proceeded.  Patient had a repeat super D noncontrasted imaging in November 2020 and referred to see me for evaluation of lung nodule.  Today in the office we discussed the patient's lung nodule imaging and reviewed the images back from the initial pictures in May 2020 that first discover the nodule.  He also has a stable contralateral nodule.  This nodule has been present since 2014.  As for the left upper lobe nodule it has started to evolve a little bit went from more solid to a subsolid form.  Otherwise the patient has no significant symptoms and feels as if his dyspnea is well controlled.  Patient denies fevers chills night sweats weight loss or hemoptysis.   Past Medical History:  Diagnosis Date  . Atrial flutter ([place])    s/p cardioversion  . Coronary artery disease   . Diabetes mellitus   . GERD (gastroesophageal reflux disease)   . History of nuclear stress test 04/04/2011   lexiscan; mod-large in size fixed inferolateral defect (scar); non-diagnostic for ischemia; low risk scan   . Hyperlipidemia   . Hypertension   . Left foot drop    r/t past disk srugery - uses Kevlar brace  . Myocardial infarction ([place])    posterior MI  . Shortness of breath   . Sleep apnea    on CPAP; 04/28/2007 split-night - AHI during total sleep 44.43/hr and REM 72.56/hr     Family History  Problem Relation Age of Onset  . Heart attack  Father      Past Surgical History:  Procedure Laterality Date  . BACK SURGERY  1985  . CARDIAC CATHETERIZATION  2010   6 stents total  . CARDIAC CATHETERIZATION  01/2000   percutaneous transluminal coronary balloon angioplasty of mid RCA stenotic lesion  . CARDIAC CATHETERIZATION  06/2006   no stenting; ischemic cardiomyopathy, EF 40-45%  . CARDIOVERSION N/A 07/28/2016   Procedure: CARDIOVERSION;  Surgeon: Troy Sine, MD;  Location: [facility];  Service: Cardiovascular;  Laterality: N/A;  . CORONARY ANGIOPLASTY  09/1998   mid-distal RCA balloon dilatation, 4.5 & 5.0 stents   . CORONARY ANGIOPLASTY WITH STENT PLACEMENT  03/1994   angioplasty & stenting (non-DES) of circumflex/prox ramus intermedius  . CORONARY ANGIOPLASTY WITH STENT PLACEMENT  10/1994   large iliac [id number] stent to RCA  . CORONARY ANGIOPLASTY WITH STENT PLACEMENT  12/2002   4.7mm stents x2 of RCA  . CORONARY ANGIOPLASTY WITH STENT PLACEMENT  01/2005   cutting balloon arthrectomy of distal RCA & Cypher DES 3.5x13; cutting balloon arthrectomy of mid RCA with Cypher DES 3.5x18  . CORONARY ANGIOPLASTY WITH STENT PLACEMENT  11/2008   stenting of mid RCA with 4.0x61mm driver, non-DES  . LEFT HEART CATHETERIZATION WITH CORONARY ANGIOGRAM N/A 02/27/2012   Procedure: LEFT HEART CATHETERIZATION WITH CORONARY ANGIOGRAM;  Surgeon: Lorretta Harp, MD;  Location: [facility] CATH LAB;  Service: Cardiovascular;  Laterality: N/A;  .  TRANSTHORACIC ECHOCARDIOGRAM  07/29/2010   EF 50=55%, mod inf wall hypokinesis & mild post wall hypokinesis; LA mild-mod dilated; mild mitral annular calcif & mild MR; mild TR & elevated RV systolic pressure; AV mildly sclerotic; mild aortic root dilatation     Social History   Socioeconomic History  . Marital status: Married    Spouse name: Not on file  . Number of children: 3  . Years of education: Not on file  . Highest education level: Not on file  Occupational History  . 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 [place]  Social Needs  . Financial resource strain: Not on file  . Food insecurity    Worry: Not on file    Inability: Not on file  . Transportation needs    Medical: Not on file    Non-medical: Not on file  Tobacco Use  . Smoking status: Former Smoker    Packs/day: 1.00    Years: 50.00    Pack years: 50.00    Types: Cigarettes    Quit date: 08/04/2017    Years since quitting: 1.7  . Smokeless tobacco: Never Used  Substance and Sexual Activity  . Alcohol use: Yes    Alcohol/week: 0.0 standard drinks    Comment: occasionally  . Drug use: No  . Sexual activity: Yes  Lifestyle  . Physical activity    Days per week: Not on file    Minutes per session: Not on file  . Stress: Not on file  Relationships  . Social Herbalist on phone: Not on file    Gets together: Not on file    Attends religious service: Not on file    Active member of club or organization: Not on file    Attends meetings of clubs or organizations: Not on file    Relationship status: Not on file  . Intimate partner violence    Fear of current or ex partner: Not on file    Emotionally abused: Not on file    Physically abused: Not on file    Forced sexual activity: Not on file  Other Topics Concern  . Not on file  Social History Narrative  . Not on file     Allergies  Allergen Reactions  . Benazepril Other (See Comments)    hyperkalemia  . Fish Allergy Itching  . Omega-3 Fatty Acids Hives and Itching  . Fish Oil Itching and Rash     Outpatient Medications Prior to Visit  Medication Sig Dispense Refill  . albuterol (PROVENTIL HFA;VENTOLIN HFA) 108 (90 Base) MCG/ACT inhaler Inhale 2 puffs into the lungs every 6 (six) hours as needed for wheezing or shortness of breath. 1 Inhaler 6  . ARNUITY ELLIPTA 200 MCG/ACT AEPB TAKE 1 PUFF BY MOUTH EVERY DAY 30 each 5  . aspirin 81 MG tablet Take 81 mg by mouth daily.    [name] BYSTOLIC 5 MG tablet TAKE 1 TABLET BY  MOUTH EVERY DAY 30 tablet 2  . ELIQUIS 5 MG TABS tablet TAKE 1 TABLET BY MOUTH TWICE A DAY 60 tablet 9  . ENTRESTO 49-51 MG TAKE 1 TABLET BY MOUTH TWICE A DAY 60 tablet 5  . Insulin Pen Needle (NOVOFINE) 32G X 6 MM MISC 1 each by Other route daily. 100 each 3  . JARDIANCE 25 MG TABS tablet TAKE 1 TABLET BY MOUTH EVERY DAY 30 tablet 5  . LEVEMIR FLEXTOUCH 100 UNIT/ML  Pen INJECT 34 UNITS INTO THE SKIN DAILY. ICD E11.65 15 mL 3  . montelukast (SINGULAIR) 10 MG tablet Take 1 tablet (10 mg total) by mouth daily. 90 tablet 3  . nitroGLYCERIN (NITROSTAT) 0.4 MG SL tablet PLACE 1 TABLET (0.4 MG TOTAL) UNDER THE TONGUE EVERY 5 (FIVE) MINUTES AS NEEDED FOR CHEST PAIN. 25 tablet 1  . ONETOUCH ULTRA test strip USE TO CHECK BLOOD SUGAR 3 TIMES A DAY (E11.9) 100 strip 2  . pantoprazole (PROTONIX) 40 MG tablet Take 1 tablet (40 mg total) by mouth daily. 30 tablet 3  . rosuvastatin (CRESTOR) 20 MG tablet TAKE 1 TABLET BY MOUTH EVERYDAY AT BEDTIME 30 tablet 10   No facility-administered medications prior to visit.     Review of Systems  Constitutional: Negative for chills, fever, malaise/fatigue and weight loss.  HENT: Negative for hearing loss, sore throat and tinnitus.   Eyes: Negative for blurred vision and double vision.  Respiratory: Negative for cough, hemoptysis, sputum production, shortness of breath, wheezing and stridor.   Cardiovascular: Negative for chest pain, palpitations, orthopnea, leg swelling and PND.  Gastrointestinal: Negative for abdominal pain, constipation, diarrhea, heartburn, nausea and vomiting.  Genitourinary: Negative for dysuria, hematuria and urgency.  Musculoskeletal: Negative for joint pain and myalgias.  Skin: Negative for itching and rash.  Neurological: Negative for dizziness, tingling, weakness and headaches.  Endo/Heme/Allergies: Negative for environmental allergies. Does not bruise/bleed easily.  Psychiatric/Behavioral: Negative for depression. The patient is not  nervous/anxious and does not have insomnia.   All other systems reviewed and are negative.    Objective:  Physical Exam Vitals signs reviewed.  Constitutional:      General: He is not in acute distress.    Appearance: He is well-developed.  HENT:     Head: Normocephalic and atraumatic.  Eyes:     General: No scleral icterus.    Conjunctiva/sclera: Conjunctivae normal.     Pupils: Pupils are equal, round, and reactive to light.  Neck:     Musculoskeletal: Neck supple.     Vascular: No JVD.     Trachea: No tracheal deviation.  Cardiovascular:     Rate and Rhythm: Normal rate and regular rhythm.     Heart sounds: Normal heart sounds. No murmur.  Pulmonary:     Effort: Pulmonary effort is normal. No tachypnea, accessory muscle usage or respiratory distress.     Breath sounds: Normal breath sounds. No stridor. No wheezing, rhonchi or rales.  Abdominal:     General: Bowel sounds are normal. There is no distension.     Palpations: Abdomen is soft.     Tenderness: There is no abdominal tenderness.  Musculoskeletal:        General: No tenderness.  Lymphadenopathy:     Cervical: No cervical adenopathy.  Skin:    General: Skin is warm and dry.     Capillary Refill: Capillary refill takes less than 2 seconds.     Findings: No rash.  Neurological:     Mental Status: He is alert and oriented to person, place, and time.  Psychiatric:        Behavior: Behavior normal.      Vitals:   05/09/19 1345  BP: 118/72  Pulse: 60  Temp: 98.9 F (37.2 C)  TempSrc: Temporal  SpO2: 97%  Weight: 222 lb 3.2 oz (100.8 kg)  Height: 6' (1.829 m)   97% on RA BMI Readings from Last 3 Encounters:  05/09/19 30.14 kg/m  03/05/19 29.69 kg/m  03/04/19  30.52 kg/m   Wt Readings from Last 3 Encounters:  05/09/19 222 lb 3.2 oz (100.8 kg)  03/05/19 225 lb (102.1 kg)  03/04/19 225 lb (102.1 kg)     CBC    Component Value Date/Time   WBC 6.3 06/21/2018 0812   RBC 5.52 06/21/2018 0812    HGB 16.7 06/21/2018 0812   HGB 16.6 04/19/2017 1030   HCT 49.6 06/21/2018 0812   HCT 48.1 04/19/2017 1030   PLT 204 06/21/2018 0812   PLT 182 04/19/2017 1030   MCV 89.9 06/21/2018 0812   MCV 90 04/19/2017 1030   MCH 30.3 06/21/2018 0812   MCHC 33.7 06/21/2018 0812   RDW 12.3 06/21/2018 0812   RDW 13.8 04/19/2017 1030   LYMPHSABS 1,884 06/21/2018 0812   MONOABS 465 01/07/2016 0820   EOSABS 183 06/21/2018 0812   BASOSABS 69 06/21/2018 0812     Chest Imaging: 11/07/2018: CT chest imaging Initial imaging showed a 1 cm pulmonary nodule left upper lobe concerning for malignancy.  Patient ultimately sent forNuclear medicine imaging.  03/12/2019: Nuclear medicine pet imaging Lingular nodule with no hypermetabolism.  However CT morphology persistent stable in size but favors a small adenocarcinoma.  Per nuclear medicine report.  05/07/2019 super D CT imaging: CT imaging revealed feels a left upper lobe rounded pulmonary nodule that has now began to evolve some.  There is clusters of nodules in this location.  After review of these images in comparison to previous is less likely a malignancy however it may be caught in evolution as it starts to lobulated into a larger mass.  This was discussed with the patient.The patient's images have been independently reviewed by me.     Pulmonary Functions Testing Results: PFT Results Latest Ref Rng & Units 03/05/2019 03/14/2018 11/29/2017  FVC-Pre L 4.27 4.29 3.88  FVC-Predicted Pre % 86 86 77  FVC-Post L - - 3.82  FVC-Predicted Post % - - 76  Pre FEV1/FVC % % 80 81 84  Post FEV1/FCV % % - - 85  FEV1-Pre L 3.42 3.48 3.25  FEV1-Predicted Pre % 93 94 88  FEV1-Post L - - 3.26  DLCO UNC% % 93 53 52  DLCO COR %Predicted % 94 64 65    FeNO: none   Pathology: none   Echocardiogram:   1. The left ventricle has low normal systolic function, with an ejection fraction of 50-55%. The cavity size was moderately dilated. Left ventricular diastolic Doppler  parameters are consistent with impaired relaxation.  2. The right ventricle has normal systolic function. The cavity was normal. There is no increase in right ventricular wall thickness.  3. The mitral valve is normal in structure.  4. The tricuspid valve is normal in structure.  5. The aortic valve is tricuspid Mild thickening of the aortic valve.  6. The pulmonic valve was normal in structure. Pulmonic valve regurgitation is mild by color flow Doppler.  7. LVEF is approximately 50 to 55% with basal inferior/inferolateral hypokinesis.  Heart Catheterization: none     Assessment & Plan:     ICD-10-CM   1. Lung nodule  R91.1 CT Super D Chest Wo Contrast  2. Former smoker  Z87.891   3. Long term current use of amiodarone  Z79.899   4. Incidental lung nodule, > 43mm and < 44mm  R91.1     Discussion: This is a 68 year old former smoker, 54-pack-year history of smoking.  Quit 2 years ago.  Was seen for dyspnea evaluation by [name].  CT imaging revealed an incidental lung nodule that has been now followed for several months.  Due to his smoking history nodule was considered high risk.  He ultimately underwent evaluation with nuclear medicine imaging which did not show but low level uptake.  Repeat super D imaging revealed that the nodule had started to dissolve and was becoming from solid to more of a subsolid state.  Plan: Today in the office we reviewed the images together. We discussed the risk benefits and alternatives of proceeding with a procedure for tissue diagnosis versus watchful waiting. The decision was made for repeat imaging in 3 months. I have ordered another super D CT. If the nodule is growing at the 20-month mark and the fact that the patient has good lung function he may be a better candidate for resection. Once we get this imaging back I will discuss images with one of our surgeons. Due to the fluctuation in the nodule behavior we may consider navigational bronchoscopy  followed by possible VATS as a concurrent procedure. However, we will discuss this further once repeat images are back. Patient to return to see me in 3 months.  I appreciate consultation and referral from [name]  Greater than 50% of this patient's 45-minute of visit was been face-to-face discussing the above recommendations and treatment plan.   Current Outpatient Medications:  .  albuterol (PROVENTIL HFA;VENTOLIN HFA) 108 (90 Base) MCG/ACT inhaler, Inhale 2 puffs into the lungs every 6 (six) hours as needed for wheezing or shortness of breath., Disp: 1 Inhaler, Rfl: 6 .  ARNUITY ELLIPTA 200 MCG/ACT AEPB, TAKE 1 PUFF BY MOUTH EVERY DAY, Disp: 30 each, Rfl: 5 .  aspirin 81 MG tablet, Take 81 mg by mouth daily., Disp: , Rfl:  .  BYSTOLIC 5 MG tablet, TAKE 1 TABLET BY MOUTH EVERY DAY, Disp: 30 tablet, Rfl: 2 .  ELIQUIS 5 MG TABS tablet, TAKE 1 TABLET BY MOUTH TWICE A DAY, Disp: 60 tablet, Rfl: 9 .  ENTRESTO 49-51 MG, TAKE 1 TABLET BY MOUTH TWICE A DAY, Disp: 60 tablet, Rfl: 5 .  Insulin Pen Needle (NOVOFINE) 32G X 6 MM MISC, 1 each by Other route daily., Disp: 100 each, Rfl: 3 .  JARDIANCE 25 MG TABS tablet, TAKE 1 TABLET BY MOUTH EVERY DAY, Disp: 30 tablet, Rfl: 5 .  LEVEMIR FLEXTOUCH 100 UNIT/ML Pen, INJECT 34 UNITS INTO THE SKIN DAILY. ICD E11.65, Disp: 15 mL, Rfl: 3 .  montelukast (SINGULAIR) 10 MG tablet, Take 1 tablet (10 mg total) by mouth daily., Disp: 90 tablet, Rfl: 3 .  nitroGLYCERIN (NITROSTAT) 0.4 MG SL tablet, PLACE 1 TABLET (0.4 MG TOTAL) UNDER THE TONGUE EVERY 5 (FIVE) MINUTES AS NEEDED FOR CHEST PAIN., Disp: 25 tablet, Rfl: 1 .  ONETOUCH ULTRA test strip, USE TO CHECK BLOOD SUGAR 3 TIMES A DAY (E11.9), Disp: 100 strip, Rfl: 2 .  pantoprazole (PROTONIX) 40 MG tablet, Take 1 tablet (40 mg total) by mouth daily., Disp: 30 tablet, Rfl: 3 .  rosuvastatin (CRESTOR) 20 MG tablet, TAKE 1 TABLET BY MOUTH EVERYDAY AT BEDTIME, Disp: 30 tablet, Rfl: 10   Garner Nash, DO  [facility] 05/09/2019 1:54 PM

## 2019-05-26 ENCOUNTER — Inpatient Hospital Stay: Admission: RE | Admit: 2019-05-26 | Payer: Medicare Other | Source: Ambulatory Visit

## 2019-05-27 ENCOUNTER — Ambulatory Visit: Payer: Medicare Other | Admitting: Pulmonary Disease

## 2019-05-27 DIAGNOSIS — D333 Benign neoplasm of cranial nerves: Secondary | ICD-10-CM | POA: Diagnosis not present

## 2019-05-27 DIAGNOSIS — H60311 Diffuse otitis externa, right ear: Secondary | ICD-10-CM | POA: Diagnosis not present

## 2019-05-27 DIAGNOSIS — Z7289 Other problems related to lifestyle: Secondary | ICD-10-CM | POA: Diagnosis not present

## 2019-05-27 DIAGNOSIS — Z87891 Personal history of nicotine dependence: Secondary | ICD-10-CM | POA: Diagnosis not present

## 2019-05-27 DIAGNOSIS — H903 Sensorineural hearing loss, bilateral: Secondary | ICD-10-CM | POA: Diagnosis not present

## 2019-05-27 DIAGNOSIS — H905 Unspecified sensorineural hearing loss: Secondary | ICD-10-CM | POA: Diagnosis not present

## 2019-06-04 ENCOUNTER — Other Ambulatory Visit: Payer: Self-pay | Admitting: Internal Medicine

## 2019-06-09 ENCOUNTER — Other Ambulatory Visit: Payer: Self-pay | Admitting: Family Medicine

## 2019-06-11 ENCOUNTER — Other Ambulatory Visit: Payer: Self-pay | Admitting: Family Medicine

## 2019-07-12 ENCOUNTER — Other Ambulatory Visit: Payer: Self-pay | Admitting: Cardiovascular Disease

## 2019-07-16 ENCOUNTER — Telehealth: Payer: Self-pay | Admitting: Pulmonary Disease

## 2019-07-17 NOTE — Telephone Encounter (Signed)
Sched for 2/9 @ 12, check in 11:45, no prep.  Gave appt info to pt.  NO PA req-Medicare primary.  Per pt, nothing further needed at this time.

## 2019-07-17 NOTE — Telephone Encounter (Signed)
Working on this one.  

## 2019-07-21 DIAGNOSIS — D333 Benign neoplasm of cranial nerves: Secondary | ICD-10-CM | POA: Diagnosis not present

## 2019-07-21 DIAGNOSIS — H90A21 Sensorineural hearing loss, unilateral, right ear, with restricted hearing on the contralateral side: Secondary | ICD-10-CM | POA: Diagnosis not present

## 2019-07-21 DIAGNOSIS — H60331 Swimmer's ear, right ear: Secondary | ICD-10-CM | POA: Diagnosis not present

## 2019-07-29 ENCOUNTER — Ambulatory Visit (HOSPITAL_COMMUNITY)
Admission: RE | Admit: 2019-07-29 | Discharge: 2019-07-29 | Disposition: A | Discharge status: Home / Self Care | Payer: Medicare Other | Source: Ambulatory Visit | Attending: Pulmonary Disease | Admitting: Pulmonary Disease

## 2019-07-29 ENCOUNTER — Other Ambulatory Visit: Payer: Self-pay

## 2019-07-29 DIAGNOSIS — R911 Solitary pulmonary nodule: Secondary | ICD-10-CM | POA: Insufficient documentation

## 2019-07-29 DIAGNOSIS — R918 Other nonspecific abnormal finding of lung field: Secondary | ICD-10-CM | POA: Diagnosis not present

## 2019-08-03 ENCOUNTER — Telehealth: Payer: Self-pay | Admitting: Internal Medicine

## 2019-08-03 NOTE — Telephone Encounter (Signed)
John [name]  Patient has ILD and nodule - last seen by me in sept 2020. Going to see Icard for nodule end ofeb 2021  Plan  - probably should see me in May 2021 - give him appt  - when schedule opens up for May

## 2019-08-04 NOTE — Telephone Encounter (Signed)
Recall has been placed for MR's May schedule.

## 2019-08-06 DIAGNOSIS — Z23 Encounter for immunization: Secondary | ICD-10-CM | POA: Diagnosis not present

## 2019-08-13 ENCOUNTER — Encounter: Payer: Self-pay | Admitting: Pulmonary Disease

## 2019-08-13 ENCOUNTER — Ambulatory Visit (INDEPENDENT_AMBULATORY_CARE_PROVIDER_SITE_OTHER): Payer: Medicare Other | Admitting: Pulmonary Disease

## 2019-08-13 ENCOUNTER — Other Ambulatory Visit: Payer: Self-pay

## 2019-08-13 VITALS — BP 126/64 | HR 66 | Temp 98.0°F | Ht 72.0 in | Wt 222.6 lb

## 2019-08-13 DIAGNOSIS — R911 Solitary pulmonary nodule: Secondary | ICD-10-CM | POA: Diagnosis not present

## 2019-08-13 NOTE — Progress Notes (Signed)
Synopsis: Referred in November 2020 for nodule evaluation by [name], MD  Subjective:   PATIENT ID: John [name] GENDER: male DOB: [birth year], MRN: [id number]  Chief Complaint  Patient presents with  . Follow-up    Pt states he has been doing well since last visit and states his breathing has been stable.    This is a 69 year old gentleman 54-pack-year history of smoking, underwent CT scan imaging which revealed a left upper lobe lung nodule followed by [name].  Patient ultimately underwent PET scan imaging in September which revealed low-level PET uptake, SUV 1.4 within the nodule.  And continued follow-up proceeded.  Patient had a repeat super D noncontrasted imaging in November 2020 and referred to see me for evaluation of lung nodule.  Today in the office we discussed the patient's lung nodule imaging and reviewed the images back from the initial pictures in May 2020 that first discover the nodule.  He also has a stable contralateral nodule.  This nodule has been present since 2014.  As for the left upper lobe nodule it has started to evolve a little bit went from more solid to a subsolid form.  Otherwise the patient has no significant symptoms and feels as if his dyspnea is well controlled.  Patient denies fevers chills night sweats weight loss or hemoptysis. Retired from Interior and spatial designer, Chartered certified accountant for [employer].   OV 08/13/2019: Here today for CT scan follow-up of lung nodule.  Patient's respiratory symptoms are stable.  He has been using his Arnuity inhaler as well as albuterol as needed.  Has not needed this recently.  Stable use of Arnuity and rinsing of mouth in the morning.  Is able to work outside in his yard.  Working on some brush piles to help clean up the yard for the springtime.  Does get some shortness of breath with significant exertion but overall states he is pretty stable.  He did review his results in my chart for his lung nodule follow-up and is  happy about the fact that the document stability.  Patient denies hemoptysis weight loss fevers chills.   Past Medical History:  Diagnosis Date  . Atrial flutter ([place])    s/p cardioversion  . Coronary artery disease   . Diabetes mellitus   . GERD (gastroesophageal reflux disease)   . History of nuclear stress test 04/04/2011   lexiscan; mod-large in size fixed inferolateral defect (scar); non-diagnostic for ischemia; low risk scan   . Hyperlipidemia   . Hypertension   . Left foot drop    r/t past disk srugery - uses Kevlar brace  . Myocardial infarction ([place])    posterior MI  . Shortness of breath   . Sleep apnea    on CPAP; 04/28/2007 split-night - AHI during total sleep 44.43/hr and REM 72.56/hr     Family History  Problem Relation Age of Onset  . Heart attack Father      Past Surgical History:  Procedure Laterality Date  . BACK SURGERY  1985  . CARDIAC CATHETERIZATION  2010   6 stents total  . CARDIAC CATHETERIZATION  01/2000   percutaneous transluminal coronary balloon angioplasty of mid RCA stenotic lesion  . CARDIAC CATHETERIZATION  06/2006   no stenting; ischemic cardiomyopathy, EF 40-45%  . CARDIOVERSION N/A 07/28/2016   Procedure: CARDIOVERSION;  Surgeon: Troy Sine, MD;  Location: [place];  Service: Cardiovascular;  Laterality: N/A;  . CORONARY ANGIOPLASTY  09/1998   mid-distal RCA balloon dilatation,  4.5 & 5.0 stents   . CORONARY ANGIOPLASTY WITH STENT PLACEMENT  03/1994   angioplasty & stenting (non-DES) of circumflex/prox ramus intermedius  . CORONARY ANGIOPLASTY WITH STENT PLACEMENT  10/1994   large iliac [id number] stent to RCA  . CORONARY ANGIOPLASTY WITH STENT PLACEMENT  12/2002   4.56mm stents x2 of RCA  . CORONARY ANGIOPLASTY WITH STENT PLACEMENT  01/2005   cutting balloon arthrectomy of distal RCA & Cypher DES 3.5x13; cutting balloon arthrectomy of mid RCA with Cypher DES 3.5x18  . CORONARY ANGIOPLASTY WITH STENT PLACEMENT  11/2008   stenting of mid RCA  with 4.0x52mm driver, non-DES  . LEFT HEART CATHETERIZATION WITH CORONARY ANGIOGRAM N/A 02/27/2012   Procedure: LEFT HEART CATHETERIZATION WITH CORONARY ANGIOGRAM;  Surgeon: Lorretta Harp, MD;  Location: [facility] CATH LAB;  Service: Cardiovascular;  Laterality: N/A;  . TRANSTHORACIC ECHOCARDIOGRAM  07/29/2010   EF 50=55%, mod inf wall hypokinesis & mild post wall hypokinesis; LA mild-mod dilated; mild mitral annular calcif & mild MR; mild TR & elevated RV systolic pressure; AV mildly sclerotic; mild aortic root dilatation     Social History   Socioeconomic History  . Marital status: Married    Spouse name: Not on file  . Number of children: 3  . Years of education: Not on file  . Highest education level: Not on file  Occupational History  . 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  Tobacco Use  . Smoking status: Former Smoker    Packs/day: 1.00    Years: 50.00    Pack years: 50.00    Types: Cigarettes    Quit date: 08/04/2017    Years since quitting: 2.0  . Smokeless tobacco: Never Used  Substance and Sexual Activity  . Alcohol use: Yes    Alcohol/week: 0.0 standard drinks    Comment: occasionally  . Drug use: No  . Sexual activity: Yes  Other Topics Concern  . Not on file  Social History Narrative  . Not on file   Social Determinants of Health   Financial Resource Strain:   . Difficulty of Paying Living Expenses: Not on file  Food Insecurity:   . Worried About Charity fundraiser in the Last Year: Not on file  . Ran Out of Food in the Last Year: Not on file  Transportation Needs:   . Lack of Transportation (Medical): Not on file  . Lack of Transportation (Non-Medical): Not on file  Physical Activity:   . Days of Exercise per Week: Not on file  . Minutes of Exercise per Session: Not on file  Stress:   . Feeling of Stress : Not on file  Social Connections:   . Frequency of Communication with Friends and Family: Not on file  .  Frequency of Social Gatherings with Friends and Family: Not on file  . Attends Religious Services: Not on file  . Active Member of Clubs or Organizations: Not on file  . Attends Archivist Meetings: Not on file  . Marital Status: Not on file  Intimate Partner Violence:   . Fear of Current or Ex-Partner: Not on file  . Emotionally Abused: Not on file  . Physically Abused: Not on file  . Sexually Abused: Not on file     Allergies  Allergen Reactions  . Benazepril Other (See Comments)    hyperkalemia  . Fish Allergy Itching  . Omega-3 Fatty Acids Hives and Itching  .  Fish Oil Itching and Rash     Outpatient Medications Prior to Visit  Medication Sig Dispense Refill  . albuterol (PROVENTIL HFA;VENTOLIN HFA) 108 (90 Base) MCG/ACT inhaler Inhale 2 puffs into the lungs every 6 (six) hours as needed for wheezing or shortness of breath. 1 Inhaler 6  . ARNUITY ELLIPTA 200 MCG/ACT AEPB INHALE 1 PUFF BY MOUTH EVERY DAY 30 each 5  . aspirin 81 MG tablet Take 81 mg by mouth daily.    [name] ELIQUIS 5 MG TABS tablet TAKE 1 TABLET BY MOUTH TWICE A DAY 60 tablet 9  . ENTRESTO 49-51 MG TAKE 1 TABLET BY MOUTH TWICE A DAY 60 tablet 5  . Insulin Pen Needle (NOVOFINE) 32G X 6 MM MISC 1 each by Other route daily. 100 each 3  . JARDIANCE 25 MG TABS tablet TAKE 1 TABLET BY MOUTH EVERY DAY 30 tablet 5  . LEVEMIR FLEXTOUCH 100 UNIT/ML Pen INJECT 34 UNITS INTO THE SKIN DAILY. ICD E11.65 15 mL 3  . montelukast (SINGULAIR) 10 MG tablet Take 1 tablet (10 mg total) by mouth daily. 90 tablet 3  . nebivolol (BYSTOLIC) 5 MG tablet Take 1 tablet (5 mg total) by mouth daily. Please keep upcoming appt for refills. Thank you 30 tablet 2  . nitroGLYCERIN (NITROSTAT) 0.4 MG SL tablet PLACE 1 TABLET (0.4 MG TOTAL) UNDER THE TONGUE EVERY 5 (FIVE) MINUTES AS NEEDED FOR CHEST PAIN. 25 tablet 1  . ONETOUCH ULTRA test strip USE TO CHECK BLOOD SUGAR 3 TIMES A DAY (E11.9) 100 strip 2  . pantoprazole (PROTONIX) 40 MG tablet  Take 1 tablet (40 mg total) by mouth daily. 30 tablet 3  . rosuvastatin (CRESTOR) 20 MG tablet TAKE 1 TABLET BY MOUTH EVERYDAY AT BEDTIME 30 tablet 10   No facility-administered medications prior to visit.    Review of Systems  Constitutional: Negative for chills, fever, malaise/fatigue and weight loss.  HENT: Negative for hearing loss, sore throat and tinnitus.   Eyes: Negative for blurred vision and double vision.  Respiratory: Negative for cough, hemoptysis, sputum production, shortness of breath, wheezing and stridor.   Cardiovascular: Negative for chest pain, palpitations, orthopnea, leg swelling and PND.  Gastrointestinal: Negative for abdominal pain, constipation, diarrhea, heartburn, nausea and vomiting.  Genitourinary: Negative for dysuria, hematuria and urgency.  Musculoskeletal: Negative for joint pain and myalgias.  Skin: Negative for itching and rash.  Neurological: Negative for dizziness, tingling, weakness and headaches.  Endo/Heme/Allergies: Negative for environmental allergies. Does not bruise/bleed easily.  Psychiatric/Behavioral: Negative for depression. The patient is not nervous/anxious and does not have insomnia.   All other systems reviewed and are negative.    Objective:  Physical Exam Vitals reviewed.  Constitutional:      General: He is not in acute distress.    Appearance: He is well-developed.  HENT:     Head: Normocephalic and atraumatic.  Eyes:     General: No scleral icterus.    Conjunctiva/sclera: Conjunctivae normal.  Neck:     Vascular: No JVD.     Trachea: No tracheal deviation.  Cardiovascular:     Rate and Rhythm: Normal rate and regular rhythm.     Heart sounds: Normal heart sounds. No murmur.  Pulmonary:     Effort: Pulmonary effort is normal. No tachypnea, accessory muscle usage or respiratory distress.     Breath sounds: Normal breath sounds. No stridor. No wheezing, rhonchi or rales.  Abdominal:     Palpations: Abdomen is soft.   Musculoskeletal:  General: No tenderness.     Cervical back: Neck supple.  Skin:    General: Skin is warm and dry.     Capillary Refill: Capillary refill takes less than 2 seconds.     Findings: No rash.  Neurological:     Mental Status: He is alert and oriented to person, place, and time.  Psychiatric:        Behavior: Behavior normal.      Vitals:   08/13/19 1120  BP: 126/64  Pulse: 66  Temp: 98 F (36.7 C)  TempSrc: Temporal  SpO2: 94%  Weight: 222 lb 9.6 oz (101 kg)  Height: 6' (1.829 m)   94% on RA BMI Readings from Last 3 Encounters:  08/13/19 30.19 kg/m  05/09/19 30.14 kg/m  03/05/19 29.69 kg/m   Wt Readings from Last 3 Encounters:  08/13/19 222 lb 9.6 oz (101 kg)  05/09/19 222 lb 3.2 oz (100.8 kg)  03/05/19 225 lb (102.1 kg)     CBC    Component Value Date/Time   WBC 6.3 06/21/2018 0812   RBC 5.52 06/21/2018 0812   HGB 16.7 06/21/2018 0812   HGB 16.6 04/19/2017 1030   HCT 49.6 06/21/2018 0812   HCT 48.1 04/19/2017 1030   PLT 204 06/21/2018 0812   PLT 182 04/19/2017 1030   MCV 89.9 06/21/2018 0812   MCV 90 04/19/2017 1030   MCH 30.3 06/21/2018 0812   MCHC 33.7 06/21/2018 0812   RDW 12.3 06/21/2018 0812   RDW 13.8 04/19/2017 1030   LYMPHSABS 1,884 06/21/2018 0812   MONOABS 465 01/07/2016 0820   EOSABS 183 06/21/2018 0812   BASOSABS 69 06/21/2018 0812     Chest Imaging: 11/07/2018: CT chest imaging Initial imaging showed a 1 cm pulmonary nodule left upper lobe concerning for malignancy.  Patient ultimately sent forNuclear medicine imaging.  03/12/2019: Nuclear medicine pet imaging Lingular nodule with no hypermetabolism.  However CT morphology persistent stable in size but favors a small adenocarcinoma.  Per nuclear medicine report.  05/07/2019 super D CT imaging: CT imaging revealed feels a left upper lobe rounded pulmonary nodule that has now began to evolve some.  There is clusters of nodules in this location.  After review of  these images in comparison to previous is less likely a malignancy however it may be caught in evolution as it starts to lobulated into a larger mass.  This was discussed with the patient.The patient's images have been independently reviewed by me.    07/29/2019 super D CT imaging: Patient with a stable 1.5 cm subsolid left upper lobe nodule.  This was previously solid on comparison and therefore has some degenerative-like changes.  It is still possible that we are dealing with a malignancy however would recommend continued follow-up.. The patient's images have been independently reviewed by me.     Pulmonary Functions Testing Results: PFT Results Latest Ref Rng & Units 03/05/2019 03/14/2018 11/29/2017  FVC-Pre L 4.27 4.29 3.88  FVC-Predicted Pre % 86 86 77  FVC-Post L - - 3.82  FVC-Predicted Post % - - 76  Pre FEV1/FVC % % 80 81 84  Post FEV1/FCV % % - - 85  FEV1-Pre L 3.42 3.48 3.25  FEV1-Predicted Pre % 93 94 88  FEV1-Post L - - 3.26  DLCO UNC% % 93 53 52  DLCO COR %Predicted % 94 64 65    FeNO: none   Pathology: none   Echocardiogram:   1. The left ventricle has low normal systolic function,  with an ejection fraction of 50-55%. The cavity size was moderately dilated. Left ventricular diastolic Doppler parameters are consistent with impaired relaxation.  2. The right ventricle has normal systolic function. The cavity was normal. There is no increase in right ventricular wall thickness.  3. The mitral valve is normal in structure.  4. The tricuspid valve is normal in structure.  5. The aortic valve is tricuspid Mild thickening of the aortic valve.  6. The pulmonic valve was normal in structure. Pulmonic valve regurgitation is mild by color flow Doppler.  7. LVEF is approximately 50 to 55% with basal inferior/inferolateral hypokinesis.  Heart Catheterization: none     Assessment & Plan:   No diagnosis found.  Discussion:  This is a 69 year old gentleman 54-pack-year history of  smoking quit 2018.  History of asthma.  Prior PFTs with normal spirometry.  Currently maintained on Arnuity.  As needed albuterol.  Recent follow-up CT imaging of the chest for a subsolid left upper lobe nodule.  CT imaging documenting stability.  Reviewed CT images today in the office.  Plan:  Today we reviewed the patient's images from recent CT. We discussed risk benefits alternatives of proceeding with watchful waiting versus tissue diagnosis. At this time patient agrees to continue to proceed with watchful waiting and continue CT surveillance. A repeat noncontrasted super D CT of the chest was completed for 6 months. Patient to follow-up with [place] in 6 months.  Greater than 50% of this patient's 30-minute office visit was been face-to-face discussing above recommendations and treatment plan.  As well as review of images as documented above.   Current Outpatient Medications:  .  albuterol (PROVENTIL HFA;VENTOLIN HFA) 108 (90 Base) MCG/ACT inhaler, Inhale 2 puffs into the lungs every 6 (six) hours as needed for wheezing or shortness of breath., Disp: 1 Inhaler, Rfl: 6 .  ARNUITY ELLIPTA 200 MCG/ACT AEPB, INHALE 1 PUFF BY MOUTH EVERY DAY, Disp: 30 each, Rfl: 5 .  aspirin 81 MG tablet, Take 81 mg by mouth daily., Disp: , Rfl:  .  ELIQUIS 5 MG TABS tablet, TAKE 1 TABLET BY MOUTH TWICE A DAY, Disp: 60 tablet, Rfl: 9 .  ENTRESTO 49-51 MG, TAKE 1 TABLET BY MOUTH TWICE A DAY, Disp: 60 tablet, Rfl: 5 .  Insulin Pen Needle (NOVOFINE) 32G X 6 MM MISC, 1 each by Other route daily., Disp: 100 each, Rfl: 3 .  JARDIANCE 25 MG TABS tablet, TAKE 1 TABLET BY MOUTH EVERY DAY, Disp: 30 tablet, Rfl: 5 .  LEVEMIR FLEXTOUCH 100 UNIT/ML Pen, INJECT 34 UNITS INTO THE SKIN DAILY. ICD E11.65, Disp: 15 mL, Rfl: 3 .  montelukast (SINGULAIR) 10 MG tablet, Take 1 tablet (10 mg total) by mouth daily., Disp: 90 tablet, Rfl: 3 .  nebivolol (BYSTOLIC) 5 MG tablet, Take 1 tablet (5 mg total) by mouth daily. Please keep  upcoming appt for refills. Thank you, Disp: 30 tablet, Rfl: 2 .  nitroGLYCERIN (NITROSTAT) 0.4 MG SL tablet, PLACE 1 TABLET (0.4 MG TOTAL) UNDER THE TONGUE EVERY 5 (FIVE) MINUTES AS NEEDED FOR CHEST PAIN., Disp: 25 tablet, Rfl: 1 .  ONETOUCH ULTRA test strip, USE TO CHECK BLOOD SUGAR 3 TIMES A DAY (E11.9), Disp: 100 strip, Rfl: 2 .  pantoprazole (PROTONIX) 40 MG tablet, Take 1 tablet (40 mg total) by mouth daily., Disp: 30 tablet, Rfl: 3 .  rosuvastatin (CRESTOR) 20 MG tablet, TAKE 1 TABLET BY MOUTH EVERYDAY AT BEDTIME, Disp: 30 tablet, Rfl: 10   John [name] L Bobbyjo Marulanda, DO  [facility] 08/13/2019 11:33 AM

## 2019-08-13 NOTE — Patient Instructions (Addendum)
Thank you for visiting Dr. Valeta Harms at [facility]. Today we recommend the following:  Orders Placed This Encounter  Procedures  . CT Super D Chest Wo Contrast   Return in about 6 months (around 02/10/2020) for w/ Dr. Valeta Harms .    Please do your part to reduce the spread of COVID-19.

## 2019-08-15 ENCOUNTER — Other Ambulatory Visit: Payer: Self-pay | Admitting: Family Medicine

## 2019-08-15 MED ORDER — LEVEMIR FLEXTOUCH 100 UNIT/ML ~~LOC~~ SOPN: PEN_INJECTOR | SUBCUTANEOUS | 3 refills | Status: DC

## 2019-08-19 ENCOUNTER — Other Ambulatory Visit: Payer: Self-pay | Admitting: Family Medicine

## 2019-08-19 DIAGNOSIS — J209 Acute bronchitis, unspecified: Secondary | ICD-10-CM

## 2019-08-19 MED ORDER — NOVOFINE 32G X 6 MM MISC: 1.0000 | Freq: Every day | 3 refills | Status: DC

## 2019-08-21 DIAGNOSIS — E11319 Type 2 diabetes mellitus with unspecified diabetic retinopathy without macular edema: Secondary | ICD-10-CM | POA: Diagnosis not present

## 2019-08-21 LAB — HM DIABETES EYE EXAM

## 2019-09-03 DIAGNOSIS — Z23 Encounter for immunization: Secondary | ICD-10-CM | POA: Diagnosis not present

## 2019-09-07 ENCOUNTER — Other Ambulatory Visit: Payer: Self-pay | Admitting: Cardiovascular Disease

## 2019-09-10 ENCOUNTER — Other Ambulatory Visit: Payer: Self-pay | Admitting: Cardiovascular Disease

## 2019-09-11 ENCOUNTER — Encounter: Payer: Self-pay | Admitting: *Deleted

## 2019-09-15 ENCOUNTER — Other Ambulatory Visit: Payer: Self-pay

## 2019-09-15 ENCOUNTER — Encounter: Payer: Self-pay | Admitting: Family Medicine

## 2019-09-15 ENCOUNTER — Ambulatory Visit (INDEPENDENT_AMBULATORY_CARE_PROVIDER_SITE_OTHER): Payer: Medicare Other | Admitting: Family Medicine

## 2019-09-15 VITALS — BP 112/74 | HR 52 | Temp 96.5°F | Resp 16 | Ht 72.0 in | Wt 225.0 lb

## 2019-09-15 DIAGNOSIS — N401 Enlarged prostate with lower urinary tract symptoms: Secondary | ICD-10-CM

## 2019-09-15 DIAGNOSIS — I5022 Chronic systolic (congestive) heart failure: Secondary | ICD-10-CM

## 2019-09-15 DIAGNOSIS — E118 Type 2 diabetes mellitus with unspecified complications: Secondary | ICD-10-CM

## 2019-09-15 DIAGNOSIS — I2510 Atherosclerotic heart disease of native coronary artery without angina pectoris: Secondary | ICD-10-CM | POA: Diagnosis not present

## 2019-09-15 DIAGNOSIS — R351 Nocturia: Secondary | ICD-10-CM | POA: Diagnosis not present

## 2019-09-15 DIAGNOSIS — Z794 Long term (current) use of insulin: Secondary | ICD-10-CM

## 2019-09-15 NOTE — Progress Notes (Signed)
Subjective:    Patient ID: John [name], male    DOB: May 05, 1951, 69 y.o.   MRN: [id number]  Medication Refill    Patient is here today for follow-up of his diabetes.  He is currently on Levemir 46 units daily.  Patient takes this in the evening.  He states that his fasting blood sugars in the morning are typically 80-90.  His 2-hour postprandial sugars are around 180 in the evening.  Occasionally they will be 200 or 210 if he has eaten cake or a suite, etc.  He denies any hypoglycemic episodes.  He denies any neuropathy in his feet.  He denies any burning or stinging pain.  Diabetic foot exam was performed today and is significant for diminished sensation to 10 g monofilament on his big toes bilaterally however he has normal sensation in the remainder of his foot and excellent pulses.  Patient also has a history of coronary artery disease and congestive heart failure.  He recently states that he was strenuously working in his yard when he developed a tightness and pain in the center of his chest.  He rested and took a nitroglycerin and the pain went away in a few minutes however this alarmed him.  He denies any chest pain with normal activity.  He denies any new dyspnea on exertion or change in his exercise capacity.  However the symptoms are concerning for typical angina.  He has an appointment to see his cardiologist next week.  He denies any symptoms of unstable angina or chest pain at rest.  He denies any orthopnea or paroxysmal nocturnal dyspnea.  His blood pressure today is well controlled at 112/74. Past Medical History:  Diagnosis Date  . Atrial flutter ([place])    s/p cardioversion  . Coronary artery disease   . Diabetes mellitus   . GERD (gastroesophageal reflux disease)   . History of nuclear stress test 04/04/2011   lexiscan; mod-large in size fixed inferolateral defect (scar); non-diagnostic for ischemia; low risk scan   . Hyperlipidemia   . Hypertension   . Left foot drop    r/t  past disk srugery - uses Kevlar brace  . Myocardial infarction ([place])    posterior MI  . Shortness of breath   . Sleep apnea    on CPAP; 04/28/2007 split-night - AHI during total sleep 44.43/hr and REM 72.56/hr   Past Surgical History:  Procedure Laterality Date  . BACK SURGERY  1985  . CARDIAC CATHETERIZATION  2010   6 stents total  . CARDIAC CATHETERIZATION  01/2000   percutaneous transluminal coronary balloon angioplasty of mid RCA stenotic lesion  . CARDIAC CATHETERIZATION  06/2006   no stenting; ischemic cardiomyopathy, EF 40-45%  . CARDIOVERSION N/A 07/28/2016   Procedure: CARDIOVERSION;  Surgeon: Troy Sine, MD;  Location: [facility];  Service: Cardiovascular;  Laterality: N/A;  . CORONARY ANGIOPLASTY  09/1998   mid-distal RCA balloon dilatation, 4.5 & 5.0 stents   . CORONARY ANGIOPLASTY WITH STENT PLACEMENT  03/1994   angioplasty & stenting (non-DES) of circumflex/prox ramus intermedius  . CORONARY ANGIOPLASTY WITH STENT PLACEMENT  10/1994   large iliac [id number] stent to RCA  . CORONARY ANGIOPLASTY WITH STENT PLACEMENT  12/2002   4.44mm stents x2 of RCA  . CORONARY ANGIOPLASTY WITH STENT PLACEMENT  01/2005   cutting balloon arthrectomy of distal RCA & Cypher DES 3.5x13; cutting balloon arthrectomy of mid RCA with Cypher DES 3.5x18  . CORONARY ANGIOPLASTY WITH STENT PLACEMENT  11/2008  stenting of mid RCA with 4.0x88mm driver, non-DES  . LEFT HEART CATHETERIZATION WITH CORONARY ANGIOGRAM N/A 02/27/2012   Procedure: LEFT HEART CATHETERIZATION WITH CORONARY ANGIOGRAM;  Surgeon: Lorretta Harp, MD;  Location: [facility] CATH LAB;  Service: Cardiovascular;  Laterality: N/A;  . TRANSTHORACIC ECHOCARDIOGRAM  07/29/2010   EF 50=55%, mod inf wall hypokinesis & mild post wall hypokinesis; LA mild-mod dilated; mild mitral annular calcif & mild MR; mild TR & elevated RV systolic pressure; AV mildly sclerotic; mild aortic root dilatation    Current Outpatient Medications on File Prior to Visit   Medication Sig Dispense Refill  . albuterol (PROVENTIL HFA;VENTOLIN HFA) 108 (90 Base) MCG/ACT inhaler Inhale 2 puffs into the lungs every 6 (six) hours as needed for wheezing or shortness of breath. 1 Inhaler 6  . ARNUITY ELLIPTA 200 MCG/ACT AEPB INHALE 1 PUFF BY MOUTH EVERY DAY 30 each 5  . aspirin 81 MG tablet Take 81 mg by mouth daily.    [name] BYSTOLIC 5 MG tablet TAKE 1 TABLET (5 MG TOTAL) BY MOUTH DAILY. PLEASE KEEP UPCOMING APPT FOR REFILLS. THANK YOU 90 tablet 3  . ELIQUIS 5 MG TABS tablet TAKE 1 TABLET BY MOUTH TWICE A DAY 60 tablet 9  . ENTRESTO 49-51 MG TAKE 1 TABLET BY MOUTH TWICE A DAY 60 tablet 5  . Insulin Detemir (LEVEMIR FLEXTOUCH) 100 UNIT/ML Pen INJECT 46 UNITS INTO THE SKIN DAILY. Dx:E11.9 30 mL 3  . Insulin Pen Needle (NOVOFINE) 32G X 6 MM MISC 1 each by Other route daily. 100 each 3  . JARDIANCE 25 MG TABS tablet TAKE 1 TABLET BY MOUTH EVERY DAY 30 tablet 5  . montelukast (SINGULAIR) 10 MG tablet Take 1 tablet (10 mg total) by mouth daily. 90 tablet 3  . nitroGLYCERIN (NITROSTAT) 0.4 MG SL tablet PLACE 1 TABLET (0.4 MG TOTAL) UNDER THE TONGUE EVERY 5 (FIVE) MINUTES AS NEEDED FOR CHEST PAIN. 25 tablet 1  . ONETOUCH ULTRA test strip USE TO CHECK BLOOD SUGAR 3 TIMES A DAY (E11.9) 100 strip 2  . pantoprazole (PROTONIX) 40 MG tablet Take 1 tablet (40 mg total) by mouth daily. 30 tablet 3  . rosuvastatin (CRESTOR) 20 MG tablet TAKE 1 TABLET BY MOUTH EVERYDAY AT BEDTIME 30 tablet 10   No current facility-administered medications on file prior to visit.   Allergies  Allergen Reactions  . Benazepril Other (See Comments)    hyperkalemia  . Fish Allergy Itching  . Omega-3 Fatty Acids Hives and Itching  . Fish Oil Itching and Rash   Social History   Socioeconomic History  . Marital status: Married    Spouse name: Not on file  . Number of children: 3  . Years of education: Not on file  . Highest education level: Not on file  Occupational History  . 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 [place]  Tobacco Use  . Smoking status: Former Smoker    Packs/day: 1.00    Years: 50.00    Pack years: 50.00    Types: Cigarettes    Quit date: 08/04/2017    Years since quitting: 2.1  . Smokeless tobacco: Never Used  Substance and Sexual Activity  . Alcohol use: Yes    Alcohol/week: 0.0 standard drinks    Comment: occasionally  . Drug use: No  . Sexual activity: Yes  Other Topics Concern  . Not on file  Social History Narrative  . Not on file  Social Determinants of Health   Financial Resource Strain:   . Difficulty of Paying Living Expenses:   Food Insecurity:   . Worried About Charity fundraiser in the Last Year:   . Arboriculturist in the Last Year:   Transportation Needs:   . Film/video editor (Medical):   [name] Lack of Transportation (Non-Medical):   Physical Activity:   . Days of Exercise per Week:   . Minutes of Exercise per Session:   Stress:   . Feeling of Stress :   Social Connections:   . Frequency of Communication with Friends and Family:   . Frequency of Social Gatherings with Friends and Family:   . Attends Religious Services:   . Active Member of Clubs or Organizations:   . Attends Archivist Meetings:   [name] Marital Status:   Intimate Partner Violence:   . Fear of Current or Ex-Partner:   . Emotionally Abused:   [name] Physically Abused:   . Sexually Abused:       Review of Systems  All other systems reviewed and are negative.      Objective:   Physical Exam  Constitutional: He appears well-developed and well-nourished. No distress.  HENT:  Right Ear: External ear normal.  Left Ear: External ear normal.  Nose: Nose normal.  Mouth/Throat: Oropharynx is clear and moist. No oropharyngeal exudate.  Neck: No thyromegaly present.  Cardiovascular: Normal rate, regular rhythm and normal heart sounds.  No murmur heard. Pulmonary/Chest: Effort normal and breath sounds normal. No  respiratory distress. He has no wheezes. He has no rales.  Abdominal: Soft. Bowel sounds are normal. He exhibits no distension and no mass. There is no abdominal tenderness. There is no rebound and no guarding.  Musculoskeletal:        General: No edema.     Cervical back: Neck supple.  Lymphadenopathy:    He has no cervical adenopathy.  Skin: He is not diaphoretic.  Vitals reviewed.         Assessment & Plan:  Controlled type 2 diabetes mellitus with complication, with long-term current use of insulin (HCC) - Plan: Hemoglobin A1c, CBC with Differential/Platelet, COMPLETE METABOLIC PANEL WITH GFR, Lipid panel, Microalbumin, urine  Benign prostatic hyperplasia with nocturia - Plan: PSA  ASCVD (arteriosclerotic cardiovascular disease)  Chronic systolic congestive heart failure ([place])  Patient's diabetes sounds well controlled.  Check a hemoglobin A1c.  Goal hemoglobin A1c is less than 7.  Also check a CMP and a fasting lipid panel.  Given his history of coronary artery disease, I would like his LDL cholesterol to be below 70.  Check for diabetic nephropathy by checking urine microalbumin.  The patient does endorse 2 episodes of nocturia per evening and also incomplete bladder evacuation.  I will screen for prostate cancer with a PSA.  Patient is currently on Bystolic/beta-blocker, [place], as well as Jardiance.  There is no evidence of fluid overload on his exam today to warrant a diuretic.  He has an appointment to see his cardiologist next week.  I am concerned that his episode sounds like stable angina.  Patient will discuss this with his cardiologist next week to determine if he needs to have a stress test to evaluate further.  Recommended the patient go to the emergency room if he develops signs or symptoms of unstable angina.

## 2019-09-16 LAB — CBC WITH DIFFERENTIAL/PLATELET
Absolute Monocytes: 419 {cells}/uL (ref 200–950)
Basophils Absolute: 71 {cells}/uL (ref 0–200)
Basophils Relative: 0.9 %
Eosinophils Absolute: 134 {cells}/uL (ref 15–500)
Eosinophils Relative: 1.7 %
HCT: 51.4 % — ABNORMAL HIGH (ref 38.5–50.0)
Hemoglobin: 17.1 g/dL (ref 13.2–17.1)
Lymphs Abs: 1778 {cells}/uL (ref 850–3900)
MCH: 30.4 pg (ref 27.0–33.0)
MCHC: 33.3 g/dL (ref 32.0–36.0)
MCV: 91.5 fL (ref 80.0–100.0)
MPV: 11.5 fL (ref 7.5–12.5)
Monocytes Relative: 5.3 %
Neutro Abs: 5498 {cells}/uL (ref 1500–7800)
Neutrophils Relative %: 69.6 %
Platelets: 168 Thousand/uL (ref 140–400)
RBC: 5.62 Million/uL (ref 4.20–5.80)
RDW: 12.2 % (ref 11.0–15.0)
Total Lymphocyte: 22.5 %
WBC: 7.9 Thousand/uL (ref 3.8–10.8)

## 2019-09-16 LAB — COMPLETE METABOLIC PANEL WITH GFR
AG Ratio: 2.2 (calc) (ref 1.0–2.5)
ALT: 20 U/L (ref 9–46)
AST: 14 U/L (ref 10–35)
Albumin: 4 g/dL (ref 3.6–5.1)
Alkaline phosphatase (APISO): 70 U/L (ref 35–144)
BUN: 17 mg/dL (ref 7–25)
CO2: 28 mmol/L (ref 20–32)
Calcium: 9.2 mg/dL (ref 8.6–10.3)
Chloride: 108 mmol/L (ref 98–110)
Creat: 0.96 mg/dL (ref 0.70–1.18)
GFR, Est African American: 92 mL/min/{1.73_m2} (ref >=60)
GFR, Est Non African American: 80 mL/min/{1.73_m2} (ref >=60)
Globulin: 1.8 g/dL (calc) — ABNORMAL LOW (ref 1.9–3.7)
Glucose, Bld: 108 mg/dL — ABNORMAL HIGH (ref 65–99)
Potassium: 4.5 mmol/L (ref 3.5–5.3)
Sodium: 142 mmol/L (ref 135–146)
Total Bilirubin: 0.7 mg/dL (ref 0.2–1.2)
Total Protein: 5.8 g/dL — ABNORMAL LOW (ref 6.1–8.1)

## 2019-09-16 LAB — LIPID PANEL
Cholesterol: 97 mg/dL (ref <200)
HDL: 29 mg/dL — ABNORMAL LOW (ref >=40)
LDL Cholesterol (Calc): 50 mg/dL (calc)
Non-HDL Cholesterol (Calc): 68 mg/dL (calc) (ref <130)
Total CHOL/HDL Ratio: 3.3 (calc) (ref <5.0)
Triglycerides: 95 mg/dL (ref <150)

## 2019-09-16 LAB — MICROALBUMIN, URINE: Microalb, Ur: 2.3 mg/dL

## 2019-09-16 LAB — HEMOGLOBIN A1C
Hgb A1c MFr Bld: 8.5 %{Hb} — ABNORMAL HIGH
Mean Plasma Glucose: 197 (calc)
eAG (mmol/L): 10.9 (calc)

## 2019-09-16 LAB — PSA: PSA: 0.5 ng/mL (ref <=4.0)

## 2019-09-16 MED ORDER — NOVOLOG FLEXPEN 100 UNIT/ML ~~LOC~~ SOPN: PEN_INJECTOR | SUBCUTANEOUS | 1 refills | Status: DC

## 2019-09-22 ENCOUNTER — Other Ambulatory Visit: Payer: Self-pay

## 2019-09-22 ENCOUNTER — Ambulatory Visit (INDEPENDENT_AMBULATORY_CARE_PROVIDER_SITE_OTHER): Payer: Medicare Other | Admitting: Podiatry

## 2019-09-22 VITALS — Temp 97.2°F

## 2019-09-22 DIAGNOSIS — M2042 Other hammer toe(s) (acquired), left foot: Secondary | ICD-10-CM | POA: Diagnosis not present

## 2019-09-22 DIAGNOSIS — E08621 Diabetes mellitus due to underlying condition with foot ulcer: Secondary | ICD-10-CM

## 2019-09-22 DIAGNOSIS — M79676 Pain in unspecified toe(s): Secondary | ICD-10-CM | POA: Diagnosis not present

## 2019-09-22 DIAGNOSIS — M21612 Bunion of left foot: Secondary | ICD-10-CM

## 2019-09-22 DIAGNOSIS — L97522 Non-pressure chronic ulcer of other part of left foot with fat layer exposed: Secondary | ICD-10-CM

## 2019-09-22 DIAGNOSIS — B351 Tinea unguium: Secondary | ICD-10-CM | POA: Diagnosis not present

## 2019-09-22 DIAGNOSIS — L600 Ingrowing nail: Secondary | ICD-10-CM

## 2019-09-22 NOTE — Patient Instructions (Signed)
Soak Instructions    THE DAY AFTER THE PROCEDURE  Place 1/4 cup of epsom salts in a quart of warm tap water.  Submerge your foot or feet with outer bandage intact for the initial soak; this will allow the bandage to become moist and wet for easy lift off.  Once you remove your bandage, continue to soak in the solution for 20 minutes.  This soak should be done twice a day.  Next, remove your foot or feet from solution, blot dry the affected area and cover.  You may use a band aid large enough to cover the area or use gauze and tape.  Apply other medications to the area as directed by the doctor such as polysporin neosporin.  IF YOUR SKIN BECOMES IRRITATED WHILE USING THESE INSTRUCTIONS, IT IS OKAY TO SWITCH TO  WHITE VINEGAR AND WATER. Or you may use antibacterial soap and water to keep the toe clean  Monitor for any signs/symptoms of infection. Call the office immediately if any occur or go directly to the emergency room. Call with any questions/concerns.    [place] Instructions-Post Nail Surgery  You have had your ingrown toenail and root treated with a chemical.  This chemical causes a burn that will drain and ooze like a blister.  This can drain for 6-8 weeks or longer.  It is important to keep this area clean, covered, and follow the soaking instructions dispensed at the time of your surgery.  This area will eventually dry and form a scab.  Once the scab forms you no longer need to soak or apply a dressing.  If at any time you experience an increase in pain, redness, swelling, or drainage, you should contact the office as soon as possible.  Pre-Operative Instructions  Congratulations, you have decided to take an important step towards improving your quality of life.  You can be assured that the doctors and staff at [facility] will be with you every step of the way.  Here are some important things you should know:  1. Plan to be at the surgery center/hospital at least  1 (one) hour prior to your scheduled time, unless otherwise directed by the surgical center/hospital staff.  You must have a responsible adult accompany you, remain during the surgery and drive you home.  Make sure you have directions to the surgical center/hospital to ensure you arrive on time. 2. If you are having surgery at [facility] or [facility], you will need a copy of your medical history and physical form from your family physician within one month prior to the date of surgery. We will give you a form for your primary physician to complete.  3. We make every effort to accommodate the date you request for surgery.  However, there are times where surgery dates or times have to be moved.  We will contact you as soon as possible if a change in schedule is required.   4. No aspirin/ibuprofen for one week before surgery.  If you are on aspirin, any non-steroidal anti-inflammatory medications (Mobic, Aleve, Ibuprofen) should not be taken seven (7) days prior to your surgery.  You make take Tylenol for pain prior to surgery.  5. Medications - If you are taking daily heart and blood pressure medications, seizure, reflux, allergy, asthma, anxiety, pain or diabetes medications, make sure you notify the surgery center/hospital before the day of surgery so they can tell you which medications you should take or avoid the day  of surgery. 6. No food or drink after midnight the night before surgery unless directed otherwise by surgical center/hospital staff. 7. No alcoholic beverages 88-FOYDX prior to surgery.  No smoking 24-hours prior or 24-hours after surgery. 8. Wear loose pants or shorts. They should be loose enough to fit over bandages, boots, and casts. 9. Don't wear slip-on shoes. Sneakers are preferred. 10. Bring your boot with you to the surgery center/hospital.  Also bring crutches or a walker if your physician has prescribed it for you.  If you do not have this equipment, it will be provided for you  after surgery. 11. If you have not been contacted by the surgery center/hospital by the day before your surgery, call to confirm the date and time of your surgery. 12. Leave-time from work may vary depending on the type of surgery you have.  Appropriate arrangements should be made prior to surgery with your employer. 13. Prescriptions will be provided immediately following surgery by your doctor.  Fill these as soon as possible after surgery and take the medication as directed. Pain medications will not be refilled on weekends and must be approved by the doctor. 14. Remove nail polish on the operative foot and avoid getting pedicures prior to surgery. 15. Wash the night before surgery.  The night before surgery wash the foot and leg well with water and the antibacterial soap provided. Be sure to pay special attention to beneath the toenails and in between the toes.  Wash for at least three (3) minutes. Rinse thoroughly with water and dry well with a towel.  Perform this wash unless told not to do so by your physician.  Enclosed: 1 Ice pack (please put in freezer the night before surgery)   1 Hibiclens skin cleaner   Pre-op instructions  If you have any questions regarding the instructions, please do not hesitate to call our office.  : [address] [place], [place] [address] -- [place]: [address], [place], [place] [address] -- [phone]  [place]: [place] [address] [place], [place] [address] -- [phone]   Website: [email or web address]

## 2019-09-23 ENCOUNTER — Other Ambulatory Visit: Payer: Self-pay | Admitting: Family Medicine

## 2019-09-25 NOTE — Progress Notes (Signed)
Subjective: Patient presents today for evaluation of pain to the lateral border of the left fifth toe that began a few weeks ago. Patient is concerned for possible ingrown nail. Applying pressure to the toe and wearing shoes increases the pain. He has not had any treatment for the symptoms.  He also complains of elongated, thickened nails that cause pain with ambulation. He is unable to trim his own nails.  He also presents with a complaint of a painful left 2nd toe that has been ongoing for the past several months. He had an exostectomy of the toe on 01/20/2019. He reports continued bleeding from the toe. He states the toe is rubbing against the great toe which increases the pain. He has been putting gauze between the toes for treatment when he is wearing shoes. Patient presents today for further treatment and evaluation.   Past Medical History:  Diagnosis Date  . Atrial flutter ([place])    s/p cardioversion  . Coronary artery disease   . Diabetes mellitus   . GERD (gastroesophageal reflux disease)   . History of nuclear stress test 04/04/2011   lexiscan; mod-large in size fixed inferolateral defect (scar); non-diagnostic for ischemia; low risk scan   . Hyperlipidemia   . Hypertension   . Left foot drop    r/t past disk srugery - uses Kevlar brace  . Myocardial infarction ([place])    posterior MI  . Shortness of breath   . Sleep apnea    on CPAP; 04/28/2007 split-night - AHI during total sleep 44.43/hr and REM 72.56/hr    Objective:  General: Well developed, nourished, in no acute distress, alert and oriented x3   Dermatology: Skin is warm, dry and supple bilateral. Lateral border of the left fifth toe appears to be erythematous with evidence of an ingrowing nail. Pain on palpation noted to the border of the nail fold.  Nails are tender, long, thickened and dystrophic with subungual debris, consistent with onychomycosis, 1-5 bilateral. Wound #1 noted to the left 2nd toe measuring  approximately 0.5 x 0.5 x 0.1 cm (LxWxD).   To the noted ulceration(s), there is no eschar. There is a moderate amount of slough, fibrin, and necrotic tissue noted. Granulation tissue and wound base is red. There is a minimal amount of serosanguineous drainage noted. There is no exposed bone muscle-tendon ligament or joint. There is no malodor. Periwound integrity is intact.  Vascular: Dorsalis Pedis artery and Posterior Tibial artery pedal pulses palpable. No lower extremity edema noted.   Neruologic: Epicritic and protective threshold sensation diminished bilaterally.  Musculoskeletal: Muscular strength within normal limits in all groups bilateral. Normal range of motion noted to all pedal and ankle joints.  Clinical evidence of bunion deformity noted to the respective foot. There is moderate pain on palpation range of motion of the first MPJ. Lateral deviation of the hallux noted consistent with hallux abductovalgus. Hammertoe contracture also noted on clinical exam to the 2nd digit of the left foot. Symptomatic pain on palpation and range of motion also noted to the metatarsal phalangeal joints of the respective hammertoe digits.   Assesement: #1 Paronychia with ingrowing nail lateral border left 5th toe #2 Incurvated nail #3 Onychomycosis of nail due to dermatophyte bilateral #4 HAV w/ bunion deformity left  #5 Hammertoe contracture noted to the left 2nd toe #6 Ulceration of the left 2nd toe secondary to diabetes mellitus   Plan of Care:  1. Patient evaluated.  2. Discussed treatment alternatives and plan of care. Explained  nail avulsion procedure and post procedure course to patient. 3. Patient opted for permanent partial nail avulsion of the lateral border left fifth toe.  4. Prior to procedure, local anesthesia infiltration utilized using 3 ml of a 50:50 mixture of 2% plain lidocaine and 0.5% plain marcaine in a normal hallux block fashion and a betadine prep performed.  5. Partial  permanent nail avulsion with chemical matrixectomy performed using [id number] applications of phenol followed by alcohol flush.  6. Light dressing applied. 7. Mechanical debridement of nails 1-5 bilaterally performed using a nail nipper. Filed with dremel without incident.  8. Medically necessary excisional debridement including subcutaneous tissue was performed using a tissue nipper and a chisel blade. Excisional debridement of all the necrotic nonviable tissue down to healthy bleeding viable tissue was performed with post-debridement measurements same as pre-. 9. The wound was cleansed and dry sterile dressing applied. 10. Today we discussed the conservative versus surgical management of the presenting pathology. The patient opts for surgical management. All possible complications and details of the procedure were explained. All patient questions were answered. No guarantees were expressed or implied. 11. Authorization for surgery was initiated today. Surgery will consist of bunionectomy with osteotomy left; DIPJ arthroplasty with MPJ capsulotomy 2nd digit left.  12. Return to clinic in 2 weeks.    Edrick Kins, DPM [facility]  Dr. Edrick Kins, [place]                                        [place], [place] [address]                Office [phone]  Fax [phone]

## 2019-09-26 ENCOUNTER — Encounter: Payer: Self-pay | Admitting: Podiatry

## 2019-09-26 ENCOUNTER — Telehealth: Payer: Self-pay

## 2019-09-26 NOTE — Telephone Encounter (Signed)
   [facility] Pre-operative Risk Assessment    Request for surgical clearance:  1. What type of surgery is being performed? Bunionectomy with osteotomy left, hammertoe repair with capsulotomy second toe left- HAV w/bunion deformity left foot and hammertoe contracture to the left 2nd toe  2. When is this surgery scheduled? tbd  3. What type of clearance is required (medical clearance vs. Pharmacy clearance to hold med vs. Both)? both  4. Are there any medications that need to be held prior to surgery and how long?unknown  5. Practice name and name of physician performing surgery? [facility]- Dr. Daylene Katayama  6. What is your office phone number [phone]   7.   What is your office fax number [phone]  8.   Anesthesia type (None, local, MAC, general) ? general   Darlyn Chamber Martin Smeal 09/26/2019, 5:28 PM  _________________________________________________________________   (provider comments below)

## 2019-09-29 ENCOUNTER — Telehealth: Payer: Self-pay

## 2019-09-29 NOTE — Telephone Encounter (Signed)
Patient called wanting to cancel his surgery scheduled for 10/23/2019. He stated his foot is doing better and he wants to hold off on surgery. Left message for Caren Griffins at [facility] of cancellation.

## 2019-09-29 NOTE — Telephone Encounter (Signed)
   Primary Cardiologist:Thomas Claiborne Billings, MD  Chart reviewed as part of pre-operative protocol coverage.   He is scheduled to see Dr. Claiborne Billings 10/01/19. Will ask Dr. Claiborne Billings to address preoperative status at that visit.   This message will also be routed to pharmacy pool or input on holding anticoagulant as requested below so that this information is available at time of patient's appointment.   Pre-op covering staff: - Please contact requesting surgeon's office via preferred method (i.e, phone, fax) to inform them of need for appointment prior to surgery.   Abigail Butts, PA-C  09/29/2019, 8:17 AM

## 2019-09-29 NOTE — Telephone Encounter (Signed)
Patient with diagnosis of aflutter on Eliquis for anticoagulation.    Procedure: Bunionectomy with osteotomy left, hammertoe repair with capsulotomy second toe left- HAV w/bunion deformity left foot and hammertoe contracture to the left 2nd toe Date of procedure: TBD  CHADS2-VASc score of  4 (HTN, AGE, DM2, CAD)  CrCl 88 ml/min  Per office protocol, patient can hold Eliquis for 2 days prior to procedure.

## 2019-10-01 ENCOUNTER — Other Ambulatory Visit: Payer: Self-pay

## 2019-10-01 ENCOUNTER — Encounter: Payer: Self-pay | Admitting: Cardiovascular Disease

## 2019-10-01 ENCOUNTER — Ambulatory Visit (INDEPENDENT_AMBULATORY_CARE_PROVIDER_SITE_OTHER): Payer: Medicare Other | Admitting: Cardiovascular Disease

## 2019-10-01 VITALS — BP 110/76 | HR 55 | Ht 72.0 in | Wt 223.2 lb

## 2019-10-01 DIAGNOSIS — I2510 Atherosclerotic heart disease of native coronary artery without angina pectoris: Secondary | ICD-10-CM

## 2019-10-01 DIAGNOSIS — E785 Hyperlipidemia, unspecified: Secondary | ICD-10-CM | POA: Diagnosis not present

## 2019-10-01 DIAGNOSIS — I5042 Chronic combined systolic (congestive) and diastolic (congestive) heart failure: Secondary | ICD-10-CM | POA: Diagnosis not present

## 2019-10-01 DIAGNOSIS — Z7901 Long term (current) use of anticoagulants: Secondary | ICD-10-CM

## 2019-10-01 DIAGNOSIS — Z9989 Dependence on other enabling machines and devices: Secondary | ICD-10-CM

## 2019-10-01 DIAGNOSIS — I10 Essential (primary) hypertension: Secondary | ICD-10-CM | POA: Diagnosis not present

## 2019-10-01 DIAGNOSIS — G4733 Obstructive sleep apnea (adult) (pediatric): Secondary | ICD-10-CM | POA: Diagnosis not present

## 2019-10-01 DIAGNOSIS — I483 Typical atrial flutter: Secondary | ICD-10-CM

## 2019-10-01 MED ORDER — NEBIVOLOL HCL 2.5 MG PO TABS: 2.5000 mg | ORAL_TABLET | Freq: Every day | ORAL | 3 refills | Status: DC

## 2019-10-01 NOTE — Progress Notes (Signed)
Patient ID: John [name], male   DOB: 11/29/1950, 69 y.o.   MRN: [id number]     HPI:  John [name] is a 69 y.o. male who is a former patient of Dr. Rollene [name]. He presents for a 12 month cardiology followup evaluation.   John [name] has known CAD and underwent multiple percutaneous cardiac interventions with stenting to circumflex and RCA. His last cardiac catheterization was done by Dr. Gwenlyn [name] in September 2013 showed his RCA stented patent but he had 40% narrowing in the very distal aspect. He has a history of hypertension, as well as long-standing tobacco use with mild polycythemia. I had seen him in a sleep clinic after he obtained a new CPAP machine.  He has severe sleep apnea with an AHI on his initial diagnostic study of 44 per hour overall and 72.6 per hour with REM sleep.  A download last year  showed excellent benefit from CPAP with an AHI of 3.1.  His initial stent to his RCA was in 1996 was a Cypher PS [id number] sent. In 1999 he underwent non-DES stenting to circumflex after he presented with a posterior MI. He had additional 4-5 stents placed in his RCA.  He has a history of prior back surgery and has had a left foot drop.  He has significant obstructive sleep apnea and continues to utilize CPAP therapy.  When I saw him in the past despite CPAP therapy he complained of fatigue and residual daytime sleepiness and I suggested the possibility of Nuvigil or Provigil if necessary.    Remotely he was seen  by Dr. Zackery [name] complaints of  atypical chest pain.  There was some concern that this may be reflux and pantoprazole was discontinued and replaced with dexilant. However, it was felt that the patient may require another stress test since his last evaluation was in 2012.  He also had had recent issues with some weight gain and shortness of breath and his intermittent Lasix dose changed to 40 mg daily.  On 05/06/2015 he underwent a nuclear stress test which was interpreted as a high risk study  due to an ejection fraction of 31%.  There was a large fixed inferior and inferoseptal wall defect suggestive of scar and evidence for inferoseptal akinesis.  There was no ischemia.   On his prior nuclear study the EF in October 2012 was 38%.  An echo Doppler study done the same day however, showed discordant data and his ejection fraction was 50-55%.  Hypokinesis of the inferior inferolateral wall was noted.  There was grade 1 diastolic dysfunction.  There was mild aortic root dilatation at 41 mm.  His left atrium was severely dilated.  His right atrium was severely dilated.  There was trivial TR.  Of note, his last echo Doppler study had shown an EF of 45-50%.  When I  saw him in November 2017  he stated that he has been using CPAP.  However, for the past month he has noticed significant increased fatigue.  He was also [name] to have elevated sugars and his insulin was adjusted.  He admits to development of some trace ankle swelling, left greater than right.  He denied any episodes of chest pain.  He was unaware of spells of tachycardia.  He denied presyncope or syncope.  During that evaluation, he was [name] to be in atrial flutter with variable block at 74 bpm of questionable duration.  At that time, I recommended that he discontinue Plavix and started him on eliquis  5 mg and further titrated his Bystolic to 10 mg.  His [id number] is at least 4 and with his CAD he was advised to continue aspirin 81 mg.  He underwent an echo Doppler study on 06/02/2016 which showed an EF of 40-45% with moderate diffuse hypokinesis.  While they are, mild aortic sclerosis and increased atrial septal thickness consistent with lipomatous hypertrophy.  I obtained a download of his CPAP unit from 02/09/2016 through 05/08/2016 and he was continuing to meet Medicare compliance with 91% of days of usage and 79% with usage greater than 4 hours.  He was averaging 5 hours and 55 minutes per night.  AHI was 3.7.  However, he had a very  large leak.  I have recommended that he get a new mask from his DME company.    He underwent successful cardioversion for atrial flutter.  He is unaware of any recurrent arrhythmia.  He continues to be on amiodarone, and Bystolic.  He is on eliquis for anticoagulation.  He has had difficulty with pain in the center of his back.  He has seen [name] and had undergone an MRI and was told of having disc disease.  He has noticed some shortness of breath.  He had undergone PFTs by [name] and was not [name] to have significant COPD or restrictive lung disease. [name]  He continues to be on amiodarone.  He has been on [id number] mg  And bystolic for hypertension and rosuvastatin 20 mg for hyperlipidemia.  Unfortunately he is still smoking at least a half a pack per day and is smoked for over 55 years.  Continues to use CPAP with 100% compliance.  He is diabetic on insulin.  He is diabetic on insulin.  He is retired as a Chartered certified accountant for [employer]. He had experienced some back discomfort.  He denies any classic exertional chest tightness.    An echo Doppler study on 04/26/2017 which showed an EF of 40-45% with grade 1 diastolic dysfunction.  There was borderline aortic root dilatation.  PA pressure was 31 mm.  He had mild LA and mild to moderate RA .  A follow-up nuclear perfusion study on 04/27/2017 continued to be low risk and showed findings consistent with prior inferolateral myocardial infarction.  The EF on the nuclear study was less than the echo at 34%.   He has had issues with shortness of breath and has been under evaluation by [name].  Fortunately, he finally quit tobacco in February 2019.  He is felt to have interstitial lung disease. Pulmonary function studies have shown restriction with low DLCO.  He felt possibly also to have asthma based on nitric oxide testing.  With his lung disease it has been recommended that amiodarone be considered for discontinuance.  He has had issues with  cough.  His ACE inhibitor was ultimately changed by [name] and he was started on low-dose Entresto at 24/26 mm twice a day in light of his reduce LV function.   When It saw him in June 2019 due to his lung disease, I recommended discontinuance of amiodarone.  I recommended that if his heart rate increased to greater than 60 bpm should increase Bystolic to 7.5 mg from his current dose at 5 mg.  I had a long discussion with him regarding Delene Loll importance of therapy and recommended further titration to 49/51 mg twice a day.  He continues to use CPAP therapy mid to 100% compliance.  He has felt well with Entresto.  He is He is seen  by Dr. Abigail Miyamoto for pulmonary follow-up.   When I saw him in August 2019 and discussed further titration of Entresto to 97/103 mg twice a day but he preferred to stay on the 49/5100 regimen.  His blood pressure was stable.  He continue to use CPAP with 100% compliance.   I  saw him in December 2019.  At that time his shortness of breath was significantly improved which was contributed by his ultimate quitting of smoking in February 2019 as well as his discontinuance of amiodarone.  He denies PND orthopnea.  He denies recent wheezing.  However, his blood sugar has not been well controlled.  Recent hemoglobin A1c was 10.0.  He has been on insulin.  He continues to tolerate Entresto 49/51 mg twice a day in addition to Bystolic 5 mg.  He is on rosuvastatin 20 mg.  Laboratory has shown an LDL cholesterol excellent at 60.  He continues to be on Singulair and as needed albuterol.    On August 19, 2018 he underwent a follow-up echo Doppler study.  EF was now 50 to 55%.  There was moderate dilation of his left ventricle and evidence for mild inferior/inferolateral hypokinesis.  There was mild pulmonary hypertension.  He felt well   and denied chest pain, PND or orthopnea.   Since I saw him, he was evaluated by Loma Sousa, PA in August 2020 and continued to feel well without chest pain  or shortness of breath. A high resolution CT obtained in May 2020 revealed a new 1 cm pulmonary nodule in the left upper lobe and stable benign 6 mm pulmonary nodule in the right middle lobe.  He subsequently had follow-up with [name] September 2020 and in February 2021 was seen by Dr. Valeta Harms with stable respiratory symptoms.  Since I last saw him he has done well but 3 weeks previously when he was overexerted he had felt weak and also noted some dizziness. He has experienced exertional shortness of breath. He also admits to trace edema of his ankles. He denies chest pain PND orthopnea. He has been on Bystolic 5 mg daily, Entresto 49/51 mg twice daily, Jardiance 25 mg daily for his devious LV dysfunction. He denies recent wheezing. He presents for evaluation.  Past Medical History:  Diagnosis Date  . Atrial flutter ([place])    s/p cardioversion  . Coronary artery disease   . Diabetes mellitus   . GERD (gastroesophageal reflux disease)   . History of nuclear stress test 04/04/2011   lexiscan; mod-large in size fixed inferolateral defect (scar); non-diagnostic for ischemia; low risk scan   . Hyperlipidemia   . Hypertension   . Left foot drop    r/t past disk srugery - uses Kevlar brace  . Myocardial infarction ([place])    posterior MI  . Shortness of breath   . Sleep apnea    on CPAP; 04/28/2007 split-night - AHI during total sleep 44.43/hr and REM 72.56/hr    Past Surgical History:  Procedure Laterality Date  . BACK SURGERY  1985  . CARDIAC CATHETERIZATION  2010   6 stents total  . CARDIAC CATHETERIZATION  01/2000   percutaneous transluminal coronary balloon angioplasty of mid RCA stenotic lesion  . CARDIAC CATHETERIZATION  06/2006   no stenting; ischemic cardiomyopathy, EF 40-45%  . CARDIOVERSION N/A 07/28/2016   Procedure: CARDIOVERSION;  Surgeon: Troy Sine, MD;  Location: [facility];  Service: Cardiovascular;  Laterality: N/A;  . CORONARY ANGIOPLASTY  09/1998   mid-distal RCA  balloon dilatation, 4.5 & 5.0 stents   . CORONARY ANGIOPLASTY WITH STENT PLACEMENT  03/1994   angioplasty & stenting (non-DES) of circumflex/prox ramus intermedius  . CORONARY ANGIOPLASTY WITH STENT PLACEMENT  10/1994   large iliac [id number] stent to RCA  . CORONARY ANGIOPLASTY WITH STENT PLACEMENT  12/2002   4.39m stents x2 of RCA  . CORONARY ANGIOPLASTY WITH STENT PLACEMENT  01/2005   cutting balloon arthrectomy of distal RCA & Cypher DES 3.5x13; cutting balloon arthrectomy of mid RCA with Cypher DES 3.5x18  . CORONARY ANGIOPLASTY WITH STENT PLACEMENT  11/2008   stenting of mid RCA with 4.0x123mdriver, non-DES  . LEFT HEART CATHETERIZATION WITH CORONARY ANGIOGRAM N/A 02/27/2012   Procedure: LEFT HEART CATHETERIZATION WITH CORONARY ANGIOGRAM;  Surgeon: JoLorretta HarpMD;  [facility];  Service: Cardiovascular;  Laterality: N/A;  . TRANSTHORACIC ECHOCARDIOGRAM  07/29/2010   EF 50=55%, mod inf wall hypokinesis & mild post wall hypokinesis; LA mild-mod dilated; mild mitral annular calcif & mild MR; mild TR & elevated RV systolic pressure; AV mildly sclerotic; mild aortic root dilatation     Allergies  Allergen Reactions  . Benazepril Other (See Comments)    hyperkalemia  . Fish Allergy Itching  . Omega-3 Fatty Acids Hives and Itching  . Fish Oil Itching and Rash    Current Outpatient Medications  Medication Sig Dispense Refill  . albuterol (PROVENTIL HFA;VENTOLIN HFA) 108 (90 Base) MCG/ACT inhaler Inhale 2 puffs into the lungs every 6 (six) hours as needed for wheezing or shortness of breath. 1 Inhaler 6  . ARNUITY ELLIPTA 200 MCG/ACT AEPB INHALE 1 PUFF BY MOUTH EVERY DAY 30 each 5  . aspirin 81 MG tablet Take 81 mg by mouth daily.    . Marland KitchenLIQUIS 5 MG TABS tablet TAKE 1 TABLET BY MOUTH TWICE A DAY 60 tablet 9  . ENTRESTO 49-51 MG TAKE 1 TABLET BY MOUTH TWICE A DAY 60 tablet 5  . insulin aspart (NOVOLOG FLEXPEN) 100 UNIT/ML FlexPen 5-20 units with lunch and dinner 15 mL 1  . Insulin  Detemir (LEVEMIR FLEXTOUCH) 100 UNIT/ML Pen INJECT 46 UNITS INTO THE SKIN DAILY. Dx:E11.9 30 mL 3  . Insulin Pen Needle (NOVOFINE) 32G X 6 MM MISC 1 each by Other route daily. 100 each 3  . JARDIANCE 25 MG TABS tablet TAKE 1 TABLET BY MOUTH EVERY DAY 30 tablet 5  . montelukast (SINGULAIR) 10 MG tablet TAKE 1 TABLET BY MOUTH EVERY DAY 90 tablet 3  . nebivolol (BYSTOLIC) 2.5 MG tablet Take 1 tablet (2.5 mg total) by mouth daily. 90 tablet 3  . nitroGLYCERIN (NITROSTAT) 0.4 MG SL tablet PLACE 1 TABLET (0.4 MG TOTAL) UNDER THE TONGUE EVERY 5 (FIVE) MINUTES AS NEEDED FOR CHEST PAIN. 25 tablet 1  . ONETOUCH ULTRA test strip USE TO CHECK BLOOD SUGAR 3 TIMES A DAY (E11.9) 100 strip 2  . pantoprazole (PROTONIX) 40 MG tablet Take 1 tablet (40 mg total) by mouth daily. 30 tablet 3  . rosuvastatin (CRESTOR) 20 MG tablet TAKE 1 TABLET BY MOUTH EVERYDAY AT BEDTIME 30 tablet 10   No current facility-administered medications for this visit.    Social History   Socioeconomic History  . Marital status: Married    Spouse name: Not on file  . Number of children: 3  . Years of education: Not on file  . Highest education level: Not on file  Occupational History  . Occupation: maFreight forwarder  Employer: OTHER    Comment: [place], [place]. [place]  Tobacco Use  . Smoking status: Former Smoker    Packs/day: 1.00    Years: 50.00    Pack years: 50.00    Types: Cigarettes    Quit date: 08/04/2017    Years since quitting: 2.1  . Smokeless tobacco: Never Used  Substance and Sexual Activity  . Alcohol use: Yes    Alcohol/week: 0.0 standard drinks    Comment: occasionally  . Drug use: No  . Sexual activity: Yes  Other Topics Concern  . Not on file  Social History Narrative  . Not on file   Social Determinants of Health   Financial Resource Strain:   . Difficulty of Paying Living Expenses:   Food Insecurity:   . Worried About Charity fundraiser in the Last Year:   . Arboriculturist in the Last Year:    Transportation Needs:   . Film/video editor (Medical):   [name] Lack of Transportation (Non-Medical):   Physical Activity:   . Days of Exercise per Week:   . Minutes of Exercise per Session:   Stress:   . Feeling of Stress :   Social Connections:   . Frequency of Communication with Friends and Family:   . Frequency of Social Gatherings with Friends and Family:   . Attends Religious Services:   . Active Member of Clubs or Organizations:   . Attends Archivist Meetings:   [name] Marital Status:   Intimate Partner Violence:   . Fear of Current or Ex-Partner:   . Emotionally Abused:   [name] Physically Abused:   . Sexually Abused:    Social history is known that he previously worked as a Freight forwarder for [employer]. There is a long-standing tobacco history. He rarely drinks alcohol.  Family History  Problem Relation Age of Onset  . Heart attack Father    ROS General: Negative; No fevers, chills, or night sweats; Positive for fatigue  HEENT: Positive for loss of hearing in his right ear Pulmonary: Had evaluation for possible interstitial lung disease; this of breath improved. History of lung nodules being followed by [name] and Dr. Valeta Harms Cardiovascular: see history of present illness Positive for possible claudication GI: Negative; No nausea, vomiting, diarrhea, or abdominal pain GU: Negative; No dysuria, hematuria, or difficulty voiding Musculoskeletal: Negative; no myalgias, joint pain, or weakness Hematologic/Oncology: Negative; no easy bruising, bleeding Endocrine: Negative; no heat/cold intolerance; no diabetes Neuro: Negative; no changes in balance, headaches Skin: Negative; No rashes or skin lesions Psychiatric: Negative; No behavioral problems, depression Sleep: Positive for obstructive sleep apnea.  Mild residual daytime sleepiness; no bruxism, restless legs, hypnogognic hallucinations, no cataplexy Other comprehensive 14 point system review is  negative.   PE BP 110/76   Pulse (!) 55   Ht 6' (1.829 m)   Wt 223 lb 3.2 oz (101.2 kg)   SpO2 96%   BMI 30.27 kg/m    Repeat blood pressure by me was 118/72 supine and 114/70 standing.  Wt Readings from Last 3 Encounters:  10/01/19 223 lb 3.2 oz (101.2 kg)  09/15/19 225 lb (102.1 kg)  08/13/19 222 lb 9.6 oz (101 kg)   General: Alert, oriented, no distress.  Skin: normal turgor, no rashes, warm and dry HEENT: Normocephalic, atraumatic. Pupils equal round and reactive to light; sclera anicteric; extraocular muscles intact;  Nose without nasal septal hypertrophy Mouth/Parynx benign; Mallinpatti scale 3 Neck: No JVD, no carotid bruits;  normal carotid upstroke Lungs: clear to ausculatation and percussion; no wheezing or rales Chest wall: without tenderness to palpitation Heart: PMI not displaced, RRR, s1 s2 normal, 1/6 systolic murmur, no diastolic murmur, no rubs, gallops, thrills, or heaves Abdomen: soft, nontender; no hepatosplenomehaly, BS+; abdominal aorta nontender and not dilated by palpation. Back: no CVA tenderness Pulses 2+ Musculoskeletal: full range of motion, normal strength, no joint deformities Extremities: Trace edema left ankle;no clubbing cyanosis , Homan's sign negative  Neurologic: grossly nonfocal; Cranial nerves grossly wnl Psychologic: Normal mood and affect  ECG (independently read by me): Sinus bradycardia 55 bpm.  Inferior Q waves consistent with old inferior MI   August 29, 2018 ECG (independently read by me): Normal sinus rhythm at 63 bpm.  Old inferior infarct.  No ST segment changes.  Normal intervals.  December 2019 ECG (independently read by me): Sinus bradycardia  at 56; Old inferior MI  August 2019 ECG (independently read by me): Sinus bradycardia 50.  Old inferior infarct with Q waves fairly  June 2019 ECG (independently read by me): Sinus bradycardia at 47 bpm.  Inferior Q waves.  QTc interval 472 ms.  No ectopy.  December 2018 ECG  (independently read by me): Sinus bradycardia 53 bpm.  Q waves inferiorly, QTc interval 463 ms.  November 2017 ECG (independently read by me): Atrial fibrillation at 57 bpm.  Inferior Q waves.  An small inferolateral Q waves.  QTc interval 418 ms.  06/05/2016 ECG (independently read by me): Atrial flutter with a rate of 79 bpm with variable block.  QTc interval 467 ms.  November 2017 ECG (independently read by me): Atrial flutter with variable block, ventricular rate at 74.  LVH by voltage criteria in aVL.  Inferior Q waves concordant with prior MI.  May 2017 ECG (independently read by me): Sinus bradycardia at 51 bpm.  Old inferior infarct pattern with prominent inferior lateral Q waves  05/03/2015 ECG (independently read by me): Sinus bradycardia 54 bpm with PVC.  Inferior and lateral Q waves concordant with inferior lateral MI  ECG (independently read by me): Sinus bradycardia 55 bpm.  Inferior Q waves compatible with old inferior MI.  No significant ST segment changes.  January 2015 ECG (independently read by me): Normal sinus rhythm at 75 beats per minute. Old inferior infarction with inferior Q waves. No other ST-T changes  LABS:  BMP Latest Ref Rng & Units 09/15/2019 02/26/2019 06/21/2018  Glucose 65 - 99 mg/dL 108(H) 115(H) 132(H)  BUN 7 - 25 mg/dL '17 15 18  '$ Creatinine 0.70 - 1.18 mg/dL 0.96 1.00 1.09  BUN/Creat Ratio 6 - 22 (calc) NOT APPLICABLE NOT APPLICABLE NOT APPLICABLE  Sodium 412 - 146 mmol/L 142 146 142  Potassium 3.5 - 5.3 mmol/L 4.5 4.5 4.8  Chloride 98 - 110 mmol/L 108 111(H) 106  CO2 20 - 32 mmol/L '28 26 27  '$ Calcium 8.6 - 10.3 mg/dL 9.2 9.4 9.4   Hepatic Function Latest Ref Rng & Units 09/15/2019 02/26/2019 06/21/2018  Total Protein 6.1 - 8.1 g/dL 5.8(L) 5.3(L) 5.7(L)  Albumin 3.6 - 4.8 g/dL - - -  AST 10 - 35 U/L '14 16 16  '$ ALT 9 - 46 U/L '20 19 20  '$ Alk Phosphatase 39 - 117 IU/L - - -  Total Bilirubin 0.2 - 1.2 mg/dL 0.7 0.6 0.6  Bilirubin, Direct - - - -    CBC  Latest Ref Rng & Units 09/15/2019 06/21/2018 09/11/2017  WBC 3.8 - 10.8 Thousand/uL 7.9 6.3 8.2  Hemoglobin 13.2 - 17.1 g/dL 17.1 16.7 14.1  Hematocrit 38.5 - 50.0 % 51.4(H) 49.6 45.0  Platelets 140 - 400 Thousand/uL 168 204 166   Lab Results  Component Value Date   MCV 91.5 09/15/2019   MCV 89.9 06/21/2018   MCV 92.4 09/11/2017   Lab Results  Component Value Date   TSH 1.72 09/11/2017    BNP    Component Value Date/Time   PROBNP 411 (H) 02/14/2018 1431    Lipid Panel     Component Value Date/Time   CHOL 97 09/15/2019 1538   CHOL 115 01/24/2019 0930   TRIG 95 09/15/2019 1538   HDL 29 (L) 09/15/2019 1538   HDL 33 (L) 01/24/2019 0930   CHOLHDL 3.3 09/15/2019 1538   VLDL 12 05/09/2016 0934   LDLCALC 50 09/15/2019 1538     RADIOLOGY: No results [name].  IMPRESSION:  1. Coronary artery disease involving native coronary artery of native heart without angina pectoris   2. Essential hypertension   3. Typical atrial flutter ([place]); s/p DC cardioversion 07/28/2016   4. Anticoagulation adequate   5. OSA on CPAP   6. Chronic combined systolic and diastolic congestive heart failure ([place])   7. Hyperlipidemia LDL goal <70     ASSESSMENT AND PLAN: Mr. Trentman is a 69 year-old Caucasian male who has CAD and has undergone prior extensive stenting to his RCA and also stenting to his circumflex vesse dating back to 51.  An echo Doppler study in 2013 showed ejection fraction of 45-50%. There was severe inferior, inferolateral and inferoseptal hypokinesis in the basal inferolateral wall segments consistent with scar.  His last cardiac catheterization by Dr. Gwenlyn [name] was in September 2013. A nuclear perfusion study in November 2016 showed a large area of inferior scar without associated ischemia.  The ejection fraction on the nuclear study was reduced at 31%, making the scan, a high-risk study. His echo Doppler study done the same day showed discordant data with an EF of 50%, but also with  inferior hypokinesis.  The echo Doppler study in November 2018 showed an EF of 40 to 45% with mild LVH, grade 1 diastolic dysfunction, and old inferior scar.  PA pressure was 31 mm.  He continued to do well without recurrent atrial flutter and remains off amiodarone. He continues to be on Eliquis for anticoagulation. Presently he appears euvolemic on his heart failure regimen consisting of Entresto 49/51 mg twice a day, Jardiance 25 mg daily. Several weeks ago he had an episode of significant weakness and transient dizziness. ECG today shows sinus bradycardia at 55 bpm. I have suggested a slight reduction in his Bystolic dose and I recommended he reduce 5 mg down to 2.5 mg daily. His blood pressure today remained stable without orthostatic change. His most recent echo Doppler study has shown improved LV function with therapy with EF increased to 50 to 55% in March 2020. His LV remained mildly dilated and there was previous inferior to inferolateral hypokinesis consistent with his prior MI. He is not having any anginal symptomatology. He continues to be on CPAP therapy for his obstructive sleep apnea. As long as he remains stable I will see him in 6 months for reevaluation or sooner as needed.    Troy Sine, MD, [facility]  10/06/2019 6:34 PM

## 2019-10-01 NOTE — Patient Instructions (Signed)
Medication Instructions:  DECREASE BYSTOLIC TO 2.5MG  DAILY *If you need a refill on your cardiac medications before your next appointment, please call your pharmacy*   Follow-Up: At [facility], you and your health needs are our priority.  As part of our continuing mission to provide you with exceptional heart care, we have created designated Provider Care Teams.  These Care Teams include your primary Cardiologist (physician) and Advanced Practice Providers (APPs -  Physician Assistants and Nurse Practitioners) who all work together to provide you with the care you need, when you need it.  We recommend signing up for the patient portal called "MyChart".  Sign up information is provided on this After Visit Summary.  MyChart is used to connect with patients for Virtual Visits (Telemedicine).  Patients are able to view lab/test results, encounter notes, upcoming appointments, etc.  Non-urgent messages can be sent to your provider as well.   To learn more about what you can do with MyChart, go to [email or web address].    Your next appointment:   6 month(s)  The format for your next appointment:   In Person  Provider:   Shelva Majestic, MD

## 2019-10-06 ENCOUNTER — Encounter: Payer: Self-pay | Admitting: Cardiovascular Disease

## 2019-10-12 ENCOUNTER — Other Ambulatory Visit: Payer: Self-pay | Admitting: Cardiovascular Disease

## 2019-10-13 ENCOUNTER — Ambulatory Visit (INDEPENDENT_AMBULATORY_CARE_PROVIDER_SITE_OTHER): Payer: Medicare Other | Admitting: Podiatry

## 2019-10-13 ENCOUNTER — Other Ambulatory Visit: Payer: Self-pay

## 2019-10-13 DIAGNOSIS — L600 Ingrowing nail: Secondary | ICD-10-CM | POA: Diagnosis not present

## 2019-10-13 MED ORDER — DOXYCYCLINE HYCLATE 100 MG PO TABS: 100.0000 mg | ORAL_TABLET | Freq: Two times a day (BID) | ORAL | 0 refills | Status: DC

## 2019-10-13 NOTE — Telephone Encounter (Signed)
68 M 101.2 kg, SCr 0.96 (3/21), LOV Claiborne Billings 4/221

## 2019-10-16 NOTE — Progress Notes (Signed)
   Subjective: 69 y.o. male presents today status post permanent nail avulsion procedure of the lateral border of the left 5th toe that was performed on 09/22/2019. He reports some continued soreness and redness. He has not been soaking the toe in Epsom salt but has been using Gentamicin cream as directed. Touching the toe increases the pain. Patient is here for further evaluation and treatment.    Past Medical History:  Diagnosis Date  . Atrial flutter ([place])    s/p cardioversion  . Coronary artery disease   . Diabetes mellitus   . GERD (gastroesophageal reflux disease)   . History of nuclear stress test 04/04/2011   lexiscan; mod-large in size fixed inferolateral defect (scar); non-diagnostic for ischemia; low risk scan   . Hyperlipidemia   . Hypertension   . Left foot drop    r/t past disk srugery - uses Kevlar brace  . Myocardial infarction ([place])    posterior MI  . Shortness of breath   . Sleep apnea    on CPAP; 04/28/2007 split-night - AHI during total sleep 44.43/hr and REM 72.56/hr    Objective: Skin is warm, dry and supple. Nail and respective nail fold appears to be healing appropriately. Open wound to the associated nail fold with a granular wound base and moderate amount of fibrotic tissue. Minimal drainage noted. Mild erythema around the periungual region likely due to phenol chemical matricectomy.  Assessment: #1 postop permanent partial nail avulsion lateral border left 5th toe #2 open wound periungual nail fold of respective digit.   Plan of care: #1 patient was evaluated  #2 debridement of open wound was performed to the periungual border of the respective toe using a currette. Antibiotic ointment and Band-Aid was applied. #3 Start Epsom salt soaks.  #4 Prescription for Doxycycline 100 mg #14 provided to patient.  #5 patient is to return to clinic in 2 weeks.   Edrick Kins, DPM [facility]  Dr. Edrick Kins, [address]                                         [place], [place] [address]                Office [phone]  Fax [phone]

## 2019-10-17 ENCOUNTER — Other Ambulatory Visit: Payer: Self-pay | Admitting: Cardiovascular Disease

## 2019-10-18 ENCOUNTER — Other Ambulatory Visit: Payer: Self-pay | Admitting: Family Medicine

## 2019-10-23 ENCOUNTER — Telehealth: Payer: Self-pay | Admitting: Family Medicine

## 2019-10-23 NOTE — Chronic Care Management (AMB) (Signed)
  Chronic Care Management   Note  10/23/2019 Name: John [name] MRN: [id number] DOB: 12/30/1950  John [name] is a 69 y.o. year old male who is a primary care patient of [name], MD. I reached out to [name] by phone today in response to a referral sent by John [name]'s PCP, [name], MD.   John [name] was given information about Chronic Care Management services today including:  1. CCM service includes personalized support from designated clinical staff supervised by his physician, including individualized plan of care and coordination with other care providers 2. 24/7 contact phone numbers for assistance for urgent and routine care needs. 3. Service will only be billed when office clinical staff spend 20 minutes or more in a month to coordinate care. 4. Only one practitioner may furnish and bill the service in a calendar month. 5. The patient may stop CCM services at any time (effective at the end of the month) by phone call to the office staff.   Patient agreed to services and verbal consent obtained.   Follow up plan:   [place]

## 2019-10-29 ENCOUNTER — Encounter: Payer: Medicare Other | Admitting: Podiatry

## 2019-11-10 ENCOUNTER — Encounter: Payer: Medicare Other | Admitting: Podiatry

## 2019-11-24 ENCOUNTER — Encounter: Payer: Medicare Other | Admitting: Podiatry

## 2019-11-27 ENCOUNTER — Ambulatory Visit: Payer: Medicare Other

## 2019-12-08 ENCOUNTER — Other Ambulatory Visit: Payer: Self-pay | Admitting: Family Medicine

## 2020-01-05 ENCOUNTER — Other Ambulatory Visit: Payer: Self-pay

## 2020-01-05 ENCOUNTER — Ambulatory Visit (HOSPITAL_COMMUNITY)
Admission: RE | Admit: 2020-01-05 | Discharge: 2020-01-05 | Disposition: A | Discharge status: Home / Self Care | Payer: Medicare Other | Source: Ambulatory Visit | Attending: Nurse Practitioner | Admitting: Nurse Practitioner

## 2020-01-05 ENCOUNTER — Ambulatory Visit (INDEPENDENT_AMBULATORY_CARE_PROVIDER_SITE_OTHER): Payer: Medicare Other | Admitting: Nurse Practitioner

## 2020-01-05 ENCOUNTER — Other Ambulatory Visit: Payer: Self-pay | Admitting: Nurse Practitioner

## 2020-01-05 VITALS — BP 124/70 | HR 56 | Temp 97.6°F | Resp 18 | Wt 222.4 lb

## 2020-01-05 DIAGNOSIS — M25561 Pain in right knee: Secondary | ICD-10-CM

## 2020-01-05 DIAGNOSIS — S8991XA Unspecified injury of right lower leg, initial encounter: Secondary | ICD-10-CM

## 2020-01-05 DIAGNOSIS — S8992XA Unspecified injury of left lower leg, initial encounter: Secondary | ICD-10-CM

## 2020-01-05 DIAGNOSIS — M25562 Pain in left knee: Secondary | ICD-10-CM

## 2020-01-05 DIAGNOSIS — M25461 Effusion, right knee: Secondary | ICD-10-CM

## 2020-01-05 DIAGNOSIS — M25462 Effusion, left knee: Secondary | ICD-10-CM

## 2020-01-05 NOTE — Progress Notes (Signed)
Established Patient Office Visit  Subjective:  Patient ID: John [name], male    DOB: [birth year]  Age: 69 y.o. MRN: [id number]  CC:  Chief Complaint  Patient presents with   Knee Pain    R knee, started x5 days,pushing motorcycle backwards with R leg, tylenol was taken, ice pack applied    HPI John [name]  Is a 69 year old male presenting to the clinic with sxs of right knee pain. The pain started Tuesday night. Tuesday early afternoon he was moving his motorcycle, he reports he understands that he has a chronic left foot drop and smaller extremity and has over compensated with left LE with pain episodes but this day when moving the motorcycle he felt that he was injuring his right knee but continued to move the motorcycle anyway as he has a high pain tolerance. Later Tuesday evening he began to have pain. The next day he started to notice that his knee felt more "loose" so he started using a crutch for support. He has tried using tylenol but no oral NSAID'S as contraindicated he says r/t his chronic medications.   Past Medical History:  Diagnosis Date   Atrial flutter ([place])    s/p cardioversion   Coronary artery disease    Diabetes mellitus    GERD (gastroesophageal reflux disease)    History of nuclear stress test 04/04/2011   lexiscan; mod-large in size fixed inferolateral defect (scar); non-diagnostic for ischemia; low risk scan    Hyperlipidemia    Hypertension    Left foot drop    r/t past disk srugery - uses Kevlar brace   Myocardial infarction ([place])    posterior MI   Shortness of breath    Sleep apnea    on CPAP; 04/28/2007 split-night - AHI during total sleep 44.43/hr and REM 72.56/hr    Past Surgical History:  Procedure Laterality Date   [place]  2010   6 stents total   CARDIAC CATHETERIZATION  01/2000   percutaneous transluminal coronary balloon angioplasty of mid RCA stenotic lesion   CARDIAC  CATHETERIZATION  06/2006   no stenting; ischemic cardiomyopathy, EF 40-45%   CARDIOVERSION N/A 07/28/2016   Procedure: CARDIOVERSION;  Surgeon: Troy Sine, MD;  Location: [facility];  Service: Cardiovascular;  Laterality: N/A;   CORONARY ANGIOPLASTY  09/1998   mid-distal RCA balloon dilatation, 4.5 & 5.0 stents    CORONARY ANGIOPLASTY WITH STENT PLACEMENT  03/1994   angioplasty & stenting (non-DES) of circumflex/prox ramus intermedius   CORONARY ANGIOPLASTY WITH STENT PLACEMENT  10/1994   large iliac [id number] stent to RCA   [place]  12/2002   4.43mm stents x2 of RCA   CORONARY ANGIOPLASTY WITH STENT PLACEMENT  01/2005   cutting balloon arthrectomy of distal RCA & Cypher DES 3.5x13; cutting balloon arthrectomy of mid RCA with Cypher DES 3.5x18   CORONARY ANGIOPLASTY WITH STENT PLACEMENT  11/2008   stenting of mid RCA with 4.0x103mm driver, non-DES   LEFT HEART CATHETERIZATION WITH CORONARY ANGIOGRAM N/A 02/27/2012   Procedure: LEFT HEART CATHETERIZATION WITH CORONARY ANGIOGRAM;  Surgeon: Lorretta Harp, MD;  Location: [facility] CATH LAB;  Service: Cardiovascular;  Laterality: N/A;   TRANSTHORACIC ECHOCARDIOGRAM  07/29/2010   EF 50=55%, mod inf wall hypokinesis & mild post wall hypokinesis; LA mild-mod dilated; mild mitral annular calcif & mild MR; mild TR & elevated RV systolic pressure; AV mildly sclerotic; mild aortic root dilatation  Family History  Problem Relation Age of Onset   Heart attack Father     Social History   Socioeconomic History   Marital status: Married    Spouse name: Not on file   Number of children: 3   Years of education: Not on file   Highest education level: Not on file  Occupational History   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 [place]  Tobacco Use   Smoking status: Former Smoker    Packs/day: 1.00    Years: 50.00    Pack years: 50.00    Types: Cigarettes    Quit date:  08/04/2017    Years since quitting: 2.4   Smokeless tobacco: Never Used  Substance and Sexual Activity   Alcohol use: Yes    Alcohol/week: 0.0 standard drinks    Comment: occasionally   Drug use: No   Sexual activity: Yes  Other Topics Concern   Not on file  Social History Narrative   Not on file   Social Determinants of Health   Financial Resource Strain:    Difficulty of Paying Living Expenses:   Food Insecurity:    Worried About Charity fundraiser in the Last Year:    Arboriculturist in the Last Year:   Transportation Needs:    Film/video editor (Medical):    Lack of Transportation (Non-Medical):   Physical Activity:    Days of Exercise per Week:    Minutes of Exercise per Session:   Stress:    Feeling of Stress :   Social Connections:    Frequency of Communication with Friends and Family:    Frequency of Social Gatherings with Friends and Family:    Attends Religious Services:    Active Member of Clubs or Organizations:    Attends Music therapist:    Marital Status:   Intimate Partner Violence:    Fear of Current or Ex-Partner:    Emotionally Abused:    Physically Abused:    Sexually Abused:     Outpatient Medications Prior to Visit  Medication Sig Dispense Refill   albuterol (PROVENTIL HFA;VENTOLIN HFA) 108 (90 Base) MCG/ACT inhaler Inhale 2 puffs into the lungs every 6 (six) hours as needed for wheezing or shortness of breath. 1 Inhaler 6   ARNUITY ELLIPTA 200 MCG/ACT AEPB INHALE 1 PUFF BY MOUTH EVERY DAY 30 each 5   aspirin 81 MG tablet Take 81 mg by mouth daily.     ELIQUIS 5 MG TABS tablet TAKE 1 TABLET BY MOUTH TWICE A DAY 180 tablet 1   ENTRESTO 49-51 MG TAKE 1 TABLET BY MOUTH TWICE A DAY 60 tablet 5   insulin aspart (NOVOLOG FLEXPEN) 100 UNIT/ML FlexPen 5-20 units with lunch and dinner 15 mL 1   Insulin Detemir (LEVEMIR FLEXTOUCH) 100 UNIT/ML Pen INJECT 46 UNITS INTO THE SKIN DAILY. Dx:E11.9 30 mL 3    Insulin Pen Needle (NOVOFINE) 32G X 6 MM MISC 1 each by Other route daily. 100 each 3   JARDIANCE 25 MG TABS tablet TAKE 1 TABLET BY MOUTH EVERY DAY 30 tablet 5   montelukast (SINGULAIR) 10 MG tablet TAKE 1 TABLET BY MOUTH EVERY DAY 90 tablet 3   nebivolol (BYSTOLIC) 2.5 MG tablet Take 1 tablet (2.5 mg total) by mouth daily. 90 tablet 3   nitroGLYCERIN (NITROSTAT) 0.4 MG SL tablet PLACE 1 TABLET (0.4 MG TOTAL) UNDER THE TONGUE EVERY 5 (FIVE) MINUTES  AS NEEDED FOR CHEST PAIN. 25 tablet 1   ONETOUCH ULTRA test strip USE TO CHECK BLOOD SUGAR 3 TIMES A DAY (E11.9) 100 strip 2   rosuvastatin (CRESTOR) 20 MG tablet Take 1 tablet (20 mg total) by mouth daily. 90 tablet 3   doxycycline (VIBRA-TABS) 100 MG tablet Take 1 tablet (100 mg total) by mouth 2 (two) times daily. 20 tablet 0   pantoprazole (PROTONIX) 40 MG tablet Take 1 tablet (40 mg total) by mouth daily. 30 tablet 3   No facility-administered medications prior to visit.    Allergies  Allergen Reactions   Benazepril Other (See Comments)    hyperkalemia   Fish Allergy Itching   Omega-3 Fatty Acids Hives and Itching   Fish Oil Itching and Rash    ROS Review of Systems  All other systems reviewed and are negative.     Objective:    Physical Exam Vitals and nursing note reviewed.  Constitutional:      Appearance: Normal appearance.  HENT:     Head: Normocephalic.  Eyes:     Extraocular Movements: Extraocular movements intact.     Conjunctiva/sclera: Conjunctivae normal.     Pupils: Pupils are equal, round, and reactive to light.  Cardiovascular:     Rate and Rhythm: Normal rate.  Pulmonary:     Effort: Pulmonary effort is normal.  Musculoskeletal:     Cervical back: Normal range of motion and neck supple.     Right lower leg: Swelling and tenderness present. No edema.     Left lower leg: No edema.       Legs:     Comments: Tenderness with palpation and mild swelling , extension, and flexion located at drawn  site  Skin:    General: Skin is warm and dry.     Coloration: Skin is not jaundiced or pale.  Neurological:     General: No focal deficit present.     Mental Status: He is alert and oriented to person, place, and time.  Psychiatric:        Mood and Affect: Mood normal.        Behavior: Behavior normal.        Thought Content: Thought content normal.        Judgment: Judgment normal.     BP 124/70 (BP Location: Right Arm, Patient Position: Sitting, Cuff Size: Normal)    Pulse (!) 56    Temp 97.6 F (36.4 C) (Temporal)    Resp 18    Wt 222 lb 6.4 oz (100.9 kg)    SpO2 96%    BMI 30.16 kg/m  Wt Readings from Last 3 Encounters:  01/05/20 222 lb 6.4 oz (100.9 kg)  10/01/19 223 lb 3.2 oz (101.2 kg)  09/15/19 225 lb (102.1 kg)     Health Maintenance Due  Topic Date Due   Hepatitis C Screening  Never done   COLONOSCOPY  Never done   COVID-19 Vaccine (2 - Moderna 2-dose series) 09/03/2019    There are no preventive care reminders to display for this patient.  Lab Results  Component Value Date   TSH 1.72 09/11/2017   Lab Results  Component Value Date   WBC 7.9 09/15/2019   HGB 17.1 09/15/2019   HCT 51.4 (H) 09/15/2019   MCV 91.5 09/15/2019   PLT 168 09/15/2019   Lab Results  Component Value Date   NA 142 09/15/2019   K 4.5 09/15/2019   CO2 28 09/15/2019   GLUCOSE 108 (  H) 09/15/2019   BUN 17 09/15/2019   CREATININE 0.96 09/15/2019   BILITOT 0.7 09/15/2019   ALKPHOS 84 04/19/2017   AST 14 09/15/2019   ALT 20 09/15/2019   PROT 5.8 (L) 09/15/2019   ALBUMIN 4.0 04/19/2017   CALCIUM 9.2 09/15/2019   ANIONGAP 10 09/11/2017   Lab Results  Component Value Date   CHOL 97 09/15/2019   Lab Results  Component Value Date   HDL 29 (L) 09/15/2019   Lab Results  Component Value Date   LDLCALC 50 09/15/2019   Lab Results  Component Value Date   TRIG 95 09/15/2019   Lab Results  Component Value Date   CHOLHDL 3.3 09/15/2019   Lab Results  Component Value Date    HGBA1C 8.5 (H) 09/15/2019      Assessment & Plan:   Problem List Items Addressed This Visit    None    Visit Diagnoses    Injury of left knee, initial encounter    -  Primary   Pain and swelling of left knee        Rest Ice/Heat may reduce discomfort Complete Xray today Elastic compression bandage  No orders of the defined types were placed in this encounter.   Follow-up: Return if symptoms worsen or fail to improve.    Annie Main, FNP

## 2020-01-07 ENCOUNTER — Other Ambulatory Visit: Payer: Self-pay | Admitting: Nurse Practitioner

## 2020-01-07 ENCOUNTER — Telehealth: Payer: Self-pay | Admitting: Nurse Practitioner

## 2020-01-07 NOTE — Telephone Encounter (Signed)
Left results on pt's vm.

## 2020-01-07 NOTE — Telephone Encounter (Signed)
I think this result went to [facility] I looked and found in the chart but it did not come to my in basket, IMPRESSION: No acute osseous abnormality. Mild tricompartment arthritis with small knee effusion.  Treatment as discussed in office. Please call and let pt know.

## 2020-01-07 NOTE — Patient Instructions (Signed)
Rest Ice/Heat may reduce discomfort Complete Xray today Elastic compression bandage

## 2020-01-17 ENCOUNTER — Other Ambulatory Visit: Payer: Self-pay | Admitting: Family Medicine

## 2020-01-19 ENCOUNTER — Ambulatory Visit (INDEPENDENT_AMBULATORY_CARE_PROVIDER_SITE_OTHER): Payer: Medicare Other | Admitting: Podiatry

## 2020-01-19 ENCOUNTER — Other Ambulatory Visit: Payer: Self-pay

## 2020-01-19 ENCOUNTER — Ambulatory Visit: Payer: Medicare Other | Admitting: Podiatry

## 2020-01-19 DIAGNOSIS — B351 Tinea unguium: Secondary | ICD-10-CM | POA: Diagnosis not present

## 2020-01-19 DIAGNOSIS — E0843 Diabetes mellitus due to underlying condition with diabetic autonomic (poly)neuropathy: Secondary | ICD-10-CM

## 2020-01-19 DIAGNOSIS — M79676 Pain in unspecified toe(s): Secondary | ICD-10-CM

## 2020-01-19 DIAGNOSIS — L989 Disorder of the skin and subcutaneous tissue, unspecified: Secondary | ICD-10-CM

## 2020-01-19 NOTE — Progress Notes (Signed)
   SUBJECTIVE Patient with a history of diabetes mellitus presents to office today complaining of elongated, thickened nails that cause pain while ambulating in shoes.  He is unable to trim his own nails. Patient is here for further evaluation and treatment.   Past Medical History:  Diagnosis Date  . Atrial flutter ([place])    s/p cardioversion  . Coronary artery disease   . Diabetes mellitus   . GERD (gastroesophageal reflux disease)   . History of nuclear stress test 04/04/2011   lexiscan; mod-large in size fixed inferolateral defect (scar); non-diagnostic for ischemia; low risk scan   . Hyperlipidemia   . Hypertension   . Left foot drop    r/t past disk srugery - uses Kevlar brace  . Myocardial infarction ([place])    posterior MI  . Shortness of breath   . Sleep apnea    on CPAP; 04/28/2007 split-night - AHI during total sleep 44.43/hr and REM 72.56/hr    OBJECTIVE General Patient is awake, alert, and oriented x 3 and in no acute distress. Derm Skin is dry and supple bilateral. Negative open lesions or macerations. Remaining integument unremarkable. Nails are tender, long, thickened and dystrophic with subungual debris, consistent with onychomycosis, 1-5 bilateral. No signs of infection noted.  There also some hyperkeratotic callus tissue noted to the bilateral feet Vasc  DP and PT pedal pulses palpable bilaterally. Temperature gradient within normal limits.  Neuro Epicritic and protective threshold sensation diminished bilaterally.  Musculoskeletal Exam No symptomatic pedal deformities noted bilateral. Muscular strength within normal limits.  ASSESSMENT 1. Diabetes Mellitus w/ peripheral neuropathy 2. Onychomycosis of nail due to dermatophyte bilateral 3.  Preulcerative callus lesions bilateral feet  4.  Pain in foot bilateral  PLAN OF CARE 1. Patient evaluated today. 2. Instructed to maintain good pedal hygiene and foot care. Stressed importance of controlling blood sugar.  3.  Mechanical debridement of nails 1-5 bilaterally performed using a nail nipper. Filed with dremel without incident.  4.  Excisional debridement of the hyperkeratotic callus tissue was performed using a tissue nipper without incident or bleeding  5.  Return to clinic in 3 mos.     Edrick Kins, DPM [facility]  Dr. Edrick Kins, [address]                                        [place], [place] [address]                Office [phone]  Fax [phone]

## 2020-01-22 DIAGNOSIS — C44629 Squamous cell carcinoma of skin of left upper limb, including shoulder: Secondary | ICD-10-CM | POA: Diagnosis not present

## 2020-01-22 DIAGNOSIS — L570 Actinic keratosis: Secondary | ICD-10-CM | POA: Diagnosis not present

## 2020-01-30 ENCOUNTER — Telehealth: Payer: Self-pay | Admitting: Pulmonary Disease

## 2020-01-30 MED ORDER — ARNUITY ELLIPTA 200 MCG/ACT IN AEPB: INHALATION_SPRAY | RESPIRATORY_TRACT | 5 refills | Status: DC

## 2020-01-30 NOTE — Telephone Encounter (Signed)
Refill sent to preferred pharmacy.

## 2020-02-10 ENCOUNTER — Other Ambulatory Visit: Payer: Self-pay

## 2020-02-10 ENCOUNTER — Ambulatory Visit (HOSPITAL_COMMUNITY)
Admission: RE | Admit: 2020-02-10 | Discharge: 2020-02-10 | Disposition: A | Discharge status: Home / Self Care | Payer: Medicare Other | Source: Ambulatory Visit | Attending: Pulmonary Disease | Admitting: Pulmonary Disease

## 2020-02-10 DIAGNOSIS — R911 Solitary pulmonary nodule: Secondary | ICD-10-CM | POA: Diagnosis not present

## 2020-02-10 DIAGNOSIS — I700 Atherosclerosis of aorta: Secondary | ICD-10-CM | POA: Diagnosis not present

## 2020-02-10 DIAGNOSIS — I2510 Atherosclerotic heart disease of native coronary artery without angina pectoris: Secondary | ICD-10-CM | POA: Diagnosis not present

## 2020-02-10 DIAGNOSIS — J439 Emphysema, unspecified: Secondary | ICD-10-CM | POA: Diagnosis not present

## 2020-02-10 DIAGNOSIS — M4814 Ankylosing hyperostosis [Forestier], thoracic region: Secondary | ICD-10-CM | POA: Diagnosis not present

## 2020-02-19 ENCOUNTER — Other Ambulatory Visit: Payer: Self-pay

## 2020-02-19 ENCOUNTER — Ambulatory Visit: Payer: Medicare Other | Admitting: Pulmonary Disease

## 2020-02-19 ENCOUNTER — Encounter: Payer: Self-pay | Admitting: Pulmonary Disease

## 2020-02-19 ENCOUNTER — Ambulatory Visit (INDEPENDENT_AMBULATORY_CARE_PROVIDER_SITE_OTHER): Payer: Medicare Other | Admitting: Pulmonary Disease

## 2020-02-19 VITALS — BP 124/78 | HR 72 | Temp 97.1°F | Ht 72.0 in | Wt 219.8 lb

## 2020-02-19 DIAGNOSIS — R911 Solitary pulmonary nodule: Secondary | ICD-10-CM

## 2020-02-19 DIAGNOSIS — J418 Mixed simple and mucopurulent chronic bronchitis: Secondary | ICD-10-CM

## 2020-02-19 DIAGNOSIS — Z87891 Personal history of nicotine dependence: Secondary | ICD-10-CM | POA: Diagnosis not present

## 2020-02-19 DIAGNOSIS — C3412 Malignant neoplasm of upper lobe, left bronchus or lung: Secondary | ICD-10-CM | POA: Insufficient documentation

## 2020-02-19 DIAGNOSIS — Z0000 Encounter for general adult medical examination without abnormal findings: Secondary | ICD-10-CM | POA: Diagnosis not present

## 2020-02-19 NOTE — Assessment & Plan Note (Signed)
Plan: Continue to not smoke 

## 2020-02-19 NOTE — Assessment & Plan Note (Signed)
Plan: Recommend seasonal flu vaccine in fall/2021 Congratulated patient on already obtaining the COVID-19 vaccinations

## 2020-02-19 NOTE — Patient Instructions (Addendum)
You were seen today by Lauraine Rinne, NP  for:   1. Lung nodule  CT that you obtained in August/2021 is reassuring  We will plan on repeating a CT of your chest in 6 months, I will confirm with Dr. Valeta Harms which type you would prefer  2. Former smoker  Continue to not smoke  3. Mixed simple and mucopurulent chronic bronchitis (HCC)  Okay to continue Arnuity  Only use your albuterol as a rescue medication to be used if you can't catch your breath by resting or doing a relaxed purse lip breathing pattern.  - The less you use it, the better it will work when you need it. - Ok to use up to 2 puffs  every 4 hours if you must but call for immediate appointment if use goes up over your usual need - Don't leave home without it !!  (think of it like the spare tire for your car)    4. Healthcare maintenance  We recommend the seasonal flu vaccine in fall/2021  Follow Up:    Return in about 6 months (around 08/18/2020), or if symptoms worsen or fail to improve, for Follow up with Dr. Valeta Harms, After Chest CT.   Please do your part to reduce the spread of COVID-19:      Reduce your risk of any infection  and COVID19 by using the similar precautions used for avoiding the common cold or flu:  [name] Wash your hands often with soap and warm water for at least 20 seconds.  If soap and water are not readily available, use an alcohol-based hand sanitizer with at least 60% alcohol.  . If coughing or sneezing, cover your mouth and nose by coughing or sneezing into the elbow areas of your shirt or coat, into a tissue or into your sleeve (not your hands). Langley Gauss A MASK when in public  . Avoid shaking hands with others and consider head nods or verbal greetings only. . Avoid touching your eyes, nose, or mouth with unwashed hands.  . Avoid close contact with people who are sick. . Avoid places or events with large numbers of people in one location, like concerts or sporting events. . If you have some symptoms  but not all symptoms, continue to monitor at home and seek medical attention if your symptoms worsen. . If you are having a medical emergency, call 911.   [place] / e-Visit: [email or web address]         [facility]: [facility]: [phone]                   [facility]: [phone]     It is flu season:   >>> Best ways to protect herself from the flu: Receive the yearly flu vaccine, practice good hand hygiene washing with soap and also using hand sanitizer when available, eat a nutritious meals, get adequate rest, hydrate appropriately   Please contact the office if your symptoms worsen or you have concerns that you are not improving.   Thank you for choosing [facility] for your healthcare, and for allowing [place] to partner with you on your healthcare journey. I am thankful to be able to provide care to you today.   Wyn Quaker FNP-C

## 2020-02-19 NOTE — Assessment & Plan Note (Signed)
Stable pulmonary nodule, and former smoker with 50+ pack year smoking history  Plan: We will recommend CT chest without contrast in 6 months We will discuss case with Dr. Valeta Harms 38-month follow-up with Dr. Valeta Harms after completing CT

## 2020-02-19 NOTE — Assessment & Plan Note (Signed)
Plan: Continue Arnuity Continue rescue inhaler Continue to not smoke Recommend seasonal flu vaccine

## 2020-02-19 NOTE — Progress Notes (Signed)
@Patient  ID: John [name], male    DOB: 08-06-1950, 69 y.o.   MRN: [id number]  Chief Complaint  Patient presents with  . Follow-up    pt is here to f/u with ct    Referring provider: [name], MD  HPI:  69 year old male former smoker followed in our office for COPD and a history of an abnormal CT chest.  PMH: Type 2 diabetes, fatigue, hypertension, dyslipidemia, CAD Smoker/ Smoking History: Former smoker.  Quit 2018.  54-pack-year smoking history. Maintenance: Arnuity Pt of: Dr. Valeta Harms  02/19/2020  - Visit   69 year old male former smoker followed in our office for COPD and history of abnormal CT chest.  Patient is followed in our office by Dr. Valeta Harms.  Last appointment was in February/2021.  It was discussed at that appointment whether or not to proceed forward with bronchoscopy for tissue biopsy or to follow abnormal CT chest/pulmonary nodule with a repeat CT in 6 months.  Patient has completed this in August.  Favoring mucoid impaction.  Patient presenting to office today reporting no acute worsened respiratory symptoms.  Patient reports adherence to [place].  He uses his rescue inhaler about 1 time a month.  He continues to not smoke.  He plans on obtaining the seasonal flu vaccine.  He has already received his COVID-19 vaccines.  He is presenting to office today to discuss most recent super D CT chest that was completed in August/2021.   Questionaires / Pulmonary Flowsheets:   ACT:  No flowsheet data found.  MMRC: No flowsheet data found.  Epworth:  No flowsheet data found.  Tests:    02/10/2020-CT super D chest without contrast-regular lingular opacity is slightly decreased, especially when compared to 05/07/2019.  Favored mucoid impaction with terminal bronchioles related to prior infection or inflammation, could consider 1 follow-up at 6 to 12 months to confirm ongoing resolution, other pulmonary nodules are similar and favored to be benign,  emphysema   03/12/2019-PET scan-lingular nodule does not show abnormal hypermetabolic them but is at the size threshold for PET resolution, CT morphology, persistence from 11/07/2018 and absence on 11/22/2017 all favor small adenocarcinoma  07/29/2019-CT super D chest without contrast-stable 1.5 cm subsolid left upper lobe nodule, previously solid, continued follow-up in 6 months is suggested, additional 6 mm right middle lobe nodule benign, aortic arthrosclerosis and emphysema  03/05/2019-pulmonary function test-FVC 4.27 (86% predicted), ratio 80, FEV1 3.42 (93% predicted), DLCO 26.65 (93% predicted)  FENO:  Lab Results  Component Value Date   NITRICOXIDE 52 11/06/2017    PFT: PFT Results Latest Ref Rng & Units 03/05/2019 03/14/2018 11/29/2017  FVC-Pre L 4.27 4.29 3.88  FVC-Predicted Pre % 86 86 77  FVC-Post L - - 3.82  FVC-Predicted Post % - - 76  Pre FEV1/FVC % % 80 81 84  Post FEV1/FCV % % - - 85  FEV1-Pre L 3.42 3.48 3.25  FEV1-Predicted Pre % 93 94 88  FEV1-Post L - - 3.26  DLCO uncorrected ml/min/mmHg 26.65 19.62 19.16  DLCO UNC% % 93 53 52  DLVA Predicted % 94 64 65    WALK:  SIX MIN WALK 03/14/2018 11/06/2017  Supplimental Oxygen during Test? (L/min) No No  Tech Comments: good pace - tolerated well without complaints / Joella Prince RN pt walked a moderately fast pace, tolerated walk well.     Imaging: CT Super D Chest Wo Contrast  Result Date: 02/10/2020 CLINICAL DATA:  Follow-up of pulmonary nodule. Ex-smoker, quitting 2 years ago. EXAM:  CT CHEST WITHOUT CONTRAST TECHNIQUE: Multidetector CT imaging of the chest was performed using thin slice collimation for electromagnetic bronchoscopy planning purposes, without intravenous contrast. COMPARISON:  07/29/2019 FINDINGS: Cardiovascular: Aortic atherosclerosis. Tortuous thoracic aorta. Mild cardiomegaly with sub endocardial hypoattenuation in left ventricular free wall, likely related to remote infarct. Dense multivessel coronary  artery atherosclerosis. Mediastinum/Nodes: No mediastinal or definite hilar adenopathy, given limitations of unenhanced CT. Lungs/Pleura: No pleural fluid. A nodule along the right minor fissure represents a subpleural lymph node of 5 mm on 81/4. 2 mm right middle lobe pulmonary nodule on 91/4 is unchanged and can be presumed benign. The left upper lobe irregular opacity measures on the order of 9 mm on 63/4 versus 1.5 cm on 07/29/2019 and 1.6 cm back on 05/07/2019 (when remeasured). Favored to be linear, solid and branching when compared to sagittal reformats. More caudal lingular nodule is solid at 4 mm on 99/4 and not significantly changed. Upper Abdomen: Normal imaged portions of the liver, spleen, stomach, pancreas, gallbladder, adrenal glands, left kidney. Upper pole right renal cyst on the order of 2.4 cm. Musculoskeletal: Diffuse idiopathic skeletal hyperostosis throughout the thoracic spine. IMPRESSION: 1. Irregular lingular opacity is slightly decreased, especially when compared to 05/07/2019. Favor mucoid impaction within terminal bronchioles related to prior infection or inflammation. Consider 1 further follow-up at 6-12 months to confirm ongoing resolution. 2. Other pulmonary nodules are similar, favored to be benign. Recommend attention on follow-up. 3. Aortic atherosclerosis (ICD10-I70.0), coronary artery atherosclerosis and emphysema (ICD10-J43.9). Electronically Signed   By: Abigail Miyamoto M.D.   On: 02/10/2020 14:35    Lab Results:  CBC    Component Value Date/Time   WBC 7.9 09/15/2019 1538   RBC 5.62 09/15/2019 1538   HGB 17.1 09/15/2019 1538   HGB 16.6 04/19/2017 1030   HCT 51.4 (H) 09/15/2019 1538   HCT 48.1 04/19/2017 1030   PLT 168 09/15/2019 1538   PLT 182 04/19/2017 1030   MCV 91.5 09/15/2019 1538   MCV 90 04/19/2017 1030   MCH 30.4 09/15/2019 1538   MCHC 33.3 09/15/2019 1538   RDW 12.2 09/15/2019 1538   RDW 13.8 04/19/2017 1030   LYMPHSABS 1,778 09/15/2019 1538    MONOABS 465 01/07/2016 0820   EOSABS 134 09/15/2019 1538   BASOSABS 71 09/15/2019 1538    BMET    Component Value Date/Time   NA 142 09/15/2019 1538   NA 145 (H) 02/14/2018 1431   K 4.5 09/15/2019 1538   CL 108 09/15/2019 1538   CO2 28 09/15/2019 1538   GLUCOSE 108 (H) 09/15/2019 1538   BUN 17 09/15/2019 1538   BUN 18 02/14/2018 1431   CREATININE 0.96 09/15/2019 1538   CALCIUM 9.2 09/15/2019 1538   GFRNONAA 80 09/15/2019 1538   GFRAA 92 09/15/2019 1538    BNP    Component Value Date/Time   BNP 282 (H) 09/21/2017 0935    ProBNP    Component Value Date/Time   PROBNP 411 (H) 02/14/2018 1431    Specialty Problems      Pulmonary Problems   COPD (chronic obstructive pulmonary disease) (HCC)   Sleep apnea, on C-pap   OSA on CPAP   Lung nodule      Allergies  Allergen Reactions  . Benazepril Other (See Comments)    hyperkalemia  . Fish Allergy Itching  . Omega-3 Fatty Acids Hives and Itching  . Fish Oil Itching and Rash    Immunization History  Administered Date(s) Administered  . Fluad Quad(high Dose  65+) 02/12/2019  . Influenza, High Dose Seasonal PF 03/15/2017, 03/14/2018  . Influenza, Seasonal, Injecte, Preservative Fre 04/07/2013  . Influenza,inj,Quad PF,6+ Mos 06/04/2014, 04/08/2015, 04/18/2016  . Influenza-Unspecified 02/25/2019  . Moderna SARS-COVID-2 Vaccination 08/06/2019, 09/24/2019  . Pneumococcal Conjugate-13 01/02/2014  . Pneumococcal Polysaccharide-23 01/07/2016  . Tdap 12/29/2012    Past Medical History:  Diagnosis Date  . Atrial flutter ([place])    s/p cardioversion  . Coronary artery disease   . Diabetes mellitus   . GERD (gastroesophageal reflux disease)   . History of nuclear stress test 04/04/2011   lexiscan; mod-large in size fixed inferolateral defect (scar); non-diagnostic for ischemia; low risk scan   . Hyperlipidemia   . Hypertension   . Left foot drop    r/t past disk srugery - uses Kevlar brace  . Myocardial infarction  ([place])    posterior MI  . Shortness of breath   . Sleep apnea    on CPAP; 04/28/2007 split-night - AHI during total sleep 44.43/hr and REM 72.56/hr    Tobacco History: Social History   Tobacco Use  Smoking Status Former Smoker  . Packs/day: 1.00  . Years: 50.00  . Pack years: 50.00  . Types: Cigarettes  . Quit date: 07/20/2016  . Years since quitting: 3.5  Smokeless Tobacco Never Used   Counseling given: Not Answered   Continue to not smoke  Outpatient Encounter Medications as of 02/19/2020  Medication Sig  . albuterol (PROVENTIL HFA;VENTOLIN HFA) 108 (90 Base) MCG/ACT inhaler Inhale 2 puffs into the lungs every 6 (six) hours as needed for wheezing or shortness of breath.  [name] aspirin 81 MG tablet Take 81 mg by mouth daily.  [name] ELIQUIS 5 MG TABS tablet TAKE 1 TABLET BY MOUTH TWICE A DAY  . ENTRESTO 49-51 MG TAKE 1 TABLET BY MOUTH TWICE A DAY  . Fluticasone Furoate (ARNUITY ELLIPTA) 200 MCG/ACT AEPB INHALE 1 PUFF BY MOUTH EVERY DAY  . insulin aspart (NOVOLOG FLEXPEN) 100 UNIT/ML FlexPen 5-20 units with lunch and dinner  . Insulin Detemir (LEVEMIR FLEXTOUCH) 100 UNIT/ML Pen INJECT 46 UNITS INTO THE SKIN DAILY. Dx:E11.9  . Insulin Pen Needle (NOVOFINE) 32G X 6 MM MISC 1 each by Other route daily.  [name] JARDIANCE 25 MG TABS tablet TAKE 1 TABLET BY MOUTH EVERY DAY  . montelukast (SINGULAIR) 10 MG tablet TAKE 1 TABLET BY MOUTH EVERY DAY  . nebivolol (BYSTOLIC) 2.5 MG tablet Take 1 tablet (2.5 mg total) by mouth daily.  . nitroGLYCERIN (NITROSTAT) 0.4 MG SL tablet PLACE 1 TABLET (0.4 MG TOTAL) UNDER THE TONGUE EVERY 5 (FIVE) MINUTES AS NEEDED FOR CHEST PAIN.  [name] ONETOUCH ULTRA test strip USE TO CHECK BLOOD SUGAR 3 TIMES A DAY (E11.9)  . rosuvastatin (CRESTOR) 20 MG tablet Take 1 tablet (20 mg total) by mouth daily.   No facility-administered encounter medications on file as of 02/19/2020.     Review of Systems  Review of Systems  Constitutional: Negative for activity change, chills,  fatigue, fever and unexpected weight change.  HENT: Positive for voice change (Hoarseness, status post stopping smoking, has been evaluated by ENT). Negative for postnasal drip, rhinorrhea, sinus pressure, sinus pain and sore throat.   Eyes: Negative.   Respiratory: Negative for cough, shortness of breath and wheezing.   Cardiovascular: Negative for chest pain and palpitations.  Gastrointestinal: Negative for constipation, diarrhea, nausea and vomiting.  Endocrine: Negative.   Genitourinary: Negative.   Musculoskeletal: Negative.   Skin: Negative.   Neurological: Negative for dizziness  and headaches.  Psychiatric/Behavioral: Negative.  Negative for dysphoric mood. The patient is not nervous/anxious.   All other systems reviewed and are negative.    Physical Exam  BP 124/78 (BP Location: Left Arm, Cuff Size: Normal)   Pulse 72   Temp (!) 97.1 F (36.2 C) (Oral)   Ht 6' (1.829 m)   Wt 219 lb 12.8 oz (99.7 kg)   SpO2 96%   BMI 29.81 kg/m   Wt Readings from Last 5 Encounters:  02/19/20 219 lb 12.8 oz (99.7 kg)  01/05/20 222 lb 6.4 oz (100.9 kg)  10/01/19 223 lb 3.2 oz (101.2 kg)  09/15/19 225 lb (102.1 kg)  08/13/19 222 lb 9.6 oz (101 kg)    BMI Readings from Last 5 Encounters:  02/19/20 29.81 kg/m  01/05/20 30.16 kg/m  10/01/19 30.27 kg/m  09/15/19 30.52 kg/m  08/13/19 30.19 kg/m     Physical Exam Vitals and nursing note reviewed.  Constitutional:      General: He is not in acute distress.    Appearance: Normal appearance. He is normal weight.  HENT:     Head: Normocephalic and atraumatic.     Right Ear: Hearing and external ear normal.     Left Ear: Hearing and external ear normal.     Nose: No mucosal edema.     Right Turbinates: Not enlarged.     Left Turbinates: Not enlarged.     Comments: Deferred due to masking requirement    Mouth/Throat:     Comments: Deferred due to masking requirement   Eyes:     Pupils: Pupils are equal, round, and reactive to  light.  Cardiovascular:     Rate and Rhythm: Normal rate and regular rhythm.     Pulses: Normal pulses.     Heart sounds: Normal heart sounds. No murmur heard.   Pulmonary:     Effort: Pulmonary effort is normal.     Breath sounds: Normal breath sounds. No decreased breath sounds, wheezing or rales.  Musculoskeletal:     Cervical back: Normal range of motion.     Right lower leg: No edema.     Left lower leg: No edema.  Lymphadenopathy:     Cervical: No cervical adenopathy.  Skin:    General: Skin is warm and dry.     Capillary Refill: Capillary refill takes less than 2 seconds.     Findings: No erythema or rash.  Neurological:     General: No focal deficit present.     Mental Status: He is alert and oriented to person, place, and time.     Motor: No weakness.     Coordination: Coordination normal.     Gait: Gait is intact. Gait normal.  Psychiatric:        Mood and Affect: Mood normal.        Behavior: Behavior normal. Behavior is cooperative.        Thought Content: Thought content normal.        Judgment: Judgment normal.       Assessment & Plan:   COPD (chronic obstructive pulmonary disease) Plan: Continue Arnuity Continue rescue inhaler Continue to not smoke Recommend seasonal flu vaccine  Lung nodule Stable pulmonary nodule, and former smoker with 50+ pack year smoking history  Plan: We will recommend CT chest without contrast in 6 months We will discuss case with Dr. Valeta Harms 62-month follow-up with Dr. Valeta Harms after completing [place] maintenance Plan: Recommend seasonal flu vaccine in fall/2021 Congratulated  patient on already obtaining the COVID-19 vaccinations  Former smoker Plan: Continue to not smoke    Return in about 6 months (around 08/18/2020), or if symptoms worsen or fail to improve, for Follow up with Dr. Valeta Harms, After Chest CT.   Lauraine Rinne, NP 02/19/2020   This appointment required 34 minutes of patient care (this includes  precharting, chart review, review of results, face-to-face care, etc.).

## 2020-02-23 NOTE — Progress Notes (Signed)
PCCM: Thanks for seeing him  Garner Nash, DO [facility] 02/23/2020 1:52 PM

## 2020-02-28 ENCOUNTER — Other Ambulatory Visit: Payer: Self-pay

## 2020-02-28 ENCOUNTER — Inpatient Hospital Stay (HOSPITAL_COMMUNITY)
Admission: EM | Admit: 2020-02-28 | Discharge: 2020-03-02 | DRG: 287 | Disposition: A | Discharge status: Other Acute Inpatient Hospital | Payer: Medicare Other | Attending: Internal Medicine | Admitting: Internal Medicine

## 2020-02-28 ENCOUNTER — Emergency Department (HOSPITAL_COMMUNITY): Payer: Medicare Other

## 2020-02-28 ENCOUNTER — Encounter (HOSPITAL_COMMUNITY): Payer: Self-pay | Admitting: Emergency Medicine

## 2020-02-28 DIAGNOSIS — I10 Essential (primary) hypertension: Secondary | ICD-10-CM | POA: Diagnosis present

## 2020-02-28 DIAGNOSIS — Z8249 Family history of ischemic heart disease and other diseases of the circulatory system: Secondary | ICD-10-CM

## 2020-02-28 DIAGNOSIS — I959 Hypotension, unspecified: Secondary | ICD-10-CM

## 2020-02-28 DIAGNOSIS — E66811 Obesity, class 1: Secondary | ICD-10-CM

## 2020-02-28 DIAGNOSIS — Z888 Allergy status to other drugs, medicaments and biological substances status: Secondary | ICD-10-CM

## 2020-02-28 DIAGNOSIS — I255 Ischemic cardiomyopathy: Secondary | ICD-10-CM | POA: Diagnosis present

## 2020-02-28 DIAGNOSIS — Z20822 Contact with and (suspected) exposure to covid-19: Secondary | ICD-10-CM | POA: Diagnosis present

## 2020-02-28 DIAGNOSIS — Z955 Presence of coronary angioplasty implant and graft: Secondary | ICD-10-CM

## 2020-02-28 DIAGNOSIS — R778 Other specified abnormalities of plasma proteins: Secondary | ICD-10-CM

## 2020-02-28 DIAGNOSIS — I712 Thoracic aortic aneurysm, without rupture, unspecified: Secondary | ICD-10-CM

## 2020-02-28 DIAGNOSIS — R55 Syncope and collapse: Secondary | ICD-10-CM

## 2020-02-28 DIAGNOSIS — I708 Atherosclerosis of other arteries: Secondary | ICD-10-CM | POA: Diagnosis not present

## 2020-02-28 DIAGNOSIS — T82855A Stenosis of coronary artery stent, initial encounter: Secondary | ICD-10-CM | POA: Diagnosis not present

## 2020-02-28 DIAGNOSIS — Z91013 Allergy to seafood: Secondary | ICD-10-CM

## 2020-02-28 DIAGNOSIS — Z6830 Body mass index (BMI) 30.0-30.9, adult: Secondary | ICD-10-CM

## 2020-02-28 DIAGNOSIS — R0789 Other chest pain: Secondary | ICD-10-CM | POA: Diagnosis not present

## 2020-02-28 DIAGNOSIS — I701 Atherosclerosis of renal artery: Secondary | ICD-10-CM | POA: Diagnosis not present

## 2020-02-28 DIAGNOSIS — E1165 Type 2 diabetes mellitus with hyperglycemia: Secondary | ICD-10-CM | POA: Diagnosis present

## 2020-02-28 DIAGNOSIS — I252 Old myocardial infarction: Secondary | ICD-10-CM

## 2020-02-28 DIAGNOSIS — I517 Cardiomegaly: Secondary | ICD-10-CM | POA: Diagnosis not present

## 2020-02-28 DIAGNOSIS — E785 Hyperlipidemia, unspecified: Secondary | ICD-10-CM | POA: Diagnosis present

## 2020-02-28 DIAGNOSIS — N281 Cyst of kidney, acquired: Secondary | ICD-10-CM | POA: Diagnosis not present

## 2020-02-28 DIAGNOSIS — Z7982 Long term (current) use of aspirin: Secondary | ICD-10-CM

## 2020-02-28 DIAGNOSIS — M549 Dorsalgia, unspecified: Secondary | ICD-10-CM | POA: Diagnosis not present

## 2020-02-28 DIAGNOSIS — I4892 Unspecified atrial flutter: Secondary | ICD-10-CM | POA: Diagnosis not present

## 2020-02-28 DIAGNOSIS — I2510 Atherosclerotic heart disease of native coronary artery without angina pectoris: Secondary | ICD-10-CM | POA: Diagnosis present

## 2020-02-28 DIAGNOSIS — H905 Unspecified sensorineural hearing loss: Secondary | ICD-10-CM | POA: Diagnosis present

## 2020-02-28 DIAGNOSIS — G4733 Obstructive sleep apnea (adult) (pediatric): Secondary | ICD-10-CM | POA: Diagnosis present

## 2020-02-28 DIAGNOSIS — I493 Ventricular premature depolarization: Secondary | ICD-10-CM | POA: Diagnosis present

## 2020-02-28 DIAGNOSIS — R7989 Other specified abnormal findings of blood chemistry: Secondary | ICD-10-CM

## 2020-02-28 DIAGNOSIS — K219 Gastro-esophageal reflux disease without esophagitis: Secondary | ICD-10-CM | POA: Diagnosis present

## 2020-02-28 DIAGNOSIS — Z7901 Long term (current) use of anticoagulants: Secondary | ICD-10-CM

## 2020-02-28 DIAGNOSIS — J449 Chronic obstructive pulmonary disease, unspecified: Secondary | ICD-10-CM | POA: Diagnosis present

## 2020-02-28 DIAGNOSIS — R0602 Shortness of breath: Secondary | ICD-10-CM | POA: Diagnosis not present

## 2020-02-28 DIAGNOSIS — R110 Nausea: Secondary | ICD-10-CM | POA: Diagnosis not present

## 2020-02-28 DIAGNOSIS — E669 Obesity, unspecified: Secondary | ICD-10-CM | POA: Diagnosis present

## 2020-02-28 DIAGNOSIS — Z794 Long term (current) use of insulin: Secondary | ICD-10-CM

## 2020-02-28 DIAGNOSIS — R079 Chest pain, unspecified: Secondary | ICD-10-CM | POA: Diagnosis present

## 2020-02-28 DIAGNOSIS — I700 Atherosclerosis of aorta: Secondary | ICD-10-CM | POA: Diagnosis not present

## 2020-02-28 DIAGNOSIS — Z79899 Other long term (current) drug therapy: Secondary | ICD-10-CM

## 2020-02-28 DIAGNOSIS — Z87891 Personal history of nicotine dependence: Secondary | ICD-10-CM

## 2020-02-28 LAB — URINALYSIS, ROUTINE W REFLEX MICROSCOPIC
Bacteria, UA: NONE SEEN
Bilirubin Urine: NEGATIVE
Glucose, UA: >=500 mg/dL — AB
Ketones, ur: NEGATIVE mg/dL
Leukocytes,Ua: NEGATIVE
Nitrite: NEGATIVE
Protein, ur: NEGATIVE mg/dL
Specific Gravity, Urine: 1.02 (ref 1.005–1.030)
pH: 5 (ref 5.0–8.0)

## 2020-02-28 LAB — CBC WITH DIFFERENTIAL/PLATELET
Abs Immature Granulocytes: 0.15 10*3/uL — ABNORMAL HIGH (ref 0.00–0.07)
Basophils Absolute: 0.1 10*3/uL (ref 0.0–0.1)
Basophils Relative: 1 %
Eosinophils Absolute: 0.1 10*3/uL (ref 0.0–0.5)
Eosinophils Relative: 1 %
HCT: 55.4 % — ABNORMAL HIGH (ref 39.0–52.0)
Hemoglobin: 17.7 g/dL — ABNORMAL HIGH (ref 13.0–17.0)
Immature Granulocytes: 1 %
Lymphocytes Relative: 15 %
Lymphs Abs: 1.7 10*3/uL (ref 0.7–4.0)
MCH: 30 pg (ref 26.0–34.0)
MCHC: 31.9 g/dL (ref 30.0–36.0)
MCV: 93.9 fL (ref 80.0–100.0)
Monocytes Absolute: 0.6 10*3/uL (ref 0.1–1.0)
Monocytes Relative: 6 %
Neutro Abs: 8.2 10*3/uL — ABNORMAL HIGH (ref 1.7–7.7)
Neutrophils Relative %: 76 %
Platelets: 182 10*3/uL (ref 150–400)
RBC: 5.9 MIL/uL — ABNORMAL HIGH (ref 4.22–5.81)
RDW: 13.8 % (ref 11.5–15.5)
WBC: 10.8 10*3/uL — ABNORMAL HIGH (ref 4.0–10.5)
nRBC: 0 % (ref 0.0–0.2)

## 2020-02-28 LAB — LIPID PANEL
Cholesterol: 115 mg/dL (ref 0–200)
HDL: 35 mg/dL — ABNORMAL LOW (ref >40)
LDL Cholesterol: 61 mg/dL (ref 0–99)
Total CHOL/HDL Ratio: 3.3 RATIO
Triglycerides: 93 mg/dL (ref <150)
VLDL: 19 mg/dL (ref 0–40)

## 2020-02-28 LAB — APTT: aPTT: 30 seconds (ref 24–36)

## 2020-02-28 LAB — COMPREHENSIVE METABOLIC PANEL
ALT: 38 U/L (ref 0–44)
AST: 32 U/L (ref 15–41)
Albumin: 4.1 g/dL (ref 3.5–5.0)
Alkaline Phosphatase: 67 U/L (ref 38–126)
Anion gap: 11 (ref 5–15)
BUN: 23 mg/dL (ref 8–23)
CO2: 25 mmol/L (ref 22–32)
Calcium: 9.6 mg/dL (ref 8.9–10.3)
Chloride: 107 mmol/L (ref 98–111)
Creatinine, Ser: 1.15 mg/dL (ref 0.61–1.24)
GFR calc Af Amer: >60 mL/min (ref >60)
GFR calc non Af Amer: >60 mL/min (ref >60)
Glucose, Bld: 168 mg/dL — ABNORMAL HIGH (ref 70–99)
Potassium: 4.1 mmol/L (ref 3.5–5.1)
Sodium: 143 mmol/L (ref 135–145)
Total Bilirubin: 0.7 mg/dL (ref 0.3–1.2)
Total Protein: 6.8 g/dL (ref 6.5–8.1)

## 2020-02-28 LAB — TROPONIN I (HIGH SENSITIVITY)
Troponin I (High Sensitivity): 38 ng/L — ABNORMAL HIGH (ref <18)
Troponin I (High Sensitivity): 72 ng/L — ABNORMAL HIGH (ref <18)

## 2020-02-28 LAB — PROTIME-INR
INR: 1.1 (ref 0.8–1.2)
Prothrombin Time: 13.7 seconds (ref 11.4–15.2)

## 2020-02-28 MED ORDER — ASPIRIN 81 MG PO CHEW: 324.0000 mg | CHEWABLE_TABLET | Freq: Once | ORAL | Status: DC

## 2020-02-28 MED ORDER — SODIUM CHLORIDE 0.9 % IV BOLUS
1000.0000 mL | Freq: Once | INTRAVENOUS | Status: AC
  Administered 2020-02-28: 1000 mL via INTRAVENOUS

## 2020-02-28 MED ORDER — SODIUM CHLORIDE 0.9 % IV SOLN: INTRAVENOUS | Status: DC

## 2020-02-28 MED ORDER — IOHEXOL 350 MG/ML SOLN
100.0000 mL | Freq: Once | INTRAVENOUS | Status: AC | PRN
  Administered 2020-02-28: 100 mL via INTRAVENOUS

## 2020-02-28 MED ORDER — HEPARIN SODIUM (PORCINE) 5000 UNIT/ML IJ SOLN: 4000.0000 [IU] | Freq: Once | INTRAMUSCULAR | Status: DC

## 2020-02-28 NOTE — ED Triage Notes (Signed)
Pt reports chest that started today while washing car, pt also had lightheadedness, nausea, back pain, and shortness of breath. BP was 71/60 when checked at home. Pt has significant Cardiac hx and 11 stents.

## 2020-02-28 NOTE — ED Provider Notes (Signed)
[facility] EMERGENCY DEPARTMENT Provider Note   CSN: [id number] Arrival date & time: 02/28/20  1844     History Chief Complaint  Patient presents with  . Chest Pain    John [name] is a 69 y.o. male.  Pt presents to the ED today with cp, lightheadedness, sob and near-syncope.  Pt said he was washing his car and developed sx.  He said he was in a shaded are and it was not hot.  He said he suddenly felt bad.  He'd sit down and feel better and then he'd stand up and feel dizzy again.  He checked his bp.  It was in the 81s and 80s.  His HR was elevated as well.  Pt is on a Bystolic which was decreased to 2.5 mg daily back in April.  No other bp meds.  Pt denies f/c.  He denies cp now.        Past Medical History:  Diagnosis Date  . Atrial flutter ([place])    s/p cardioversion  . Coronary artery disease   . Diabetes mellitus   . GERD (gastroesophageal reflux disease)   . History of nuclear stress test 04/04/2011   lexiscan; mod-large in size fixed inferolateral defect (scar); non-diagnostic for ischemia; low risk scan   . Hyperlipidemia   . Hypertension   . Left foot drop    r/t past disk srugery - uses Kevlar brace  . Myocardial infarction ([place])    posterior MI  . Shortness of breath   . Sleep apnea    on CPAP; 04/28/2007 split-night - AHI during total sleep 44.43/hr and REM 72.56/hr    Patient Active Problem List   Diagnosis Date Noted  . Lung nodule 02/19/2020  . Former smoker 02/19/2020  . Healthcare maintenance 02/19/2020  . Acoustic neuroma ([place]) 09/11/2017  . Asymmetrical sensorineural hearing loss 09/11/2017  . Encounter for cardioversion procedure   . Anticoagulation adequate 06/06/2016  . Fatigue 10/27/2015  . Bradycardia 10/27/2015  . Hyperlipidemia LDL goal <70 05/20/2015  . OSA on CPAP 07/23/2013  . Excessive daytime sleepiness 07/23/2013  . Hypertension   . Hyperlipidemia   . Diabetes mellitus   . Chest pain 08/18/2012  . Tobacco abuse 08/18/2012   . Unstable angina, cath [facility] 02/27/12 02/26/2012  . CAD, multiple prior RCA PCI's. Last cath 2010, Myoview low risk Oct 2012 02/26/2012  . IDDM (insulin dependent diabetes mellitus) 02/26/2012  . HTN (hypertension) 02/26/2012  . Dyslipidemia, low HDL. Intol to fish oil and Noacin 02/26/2012  . Sleep apnea, on C-pap 02/26/2012  . COPD (chronic obstructive pulmonary disease) ([place]) 02/26/2012  . Smoking, quit one week ago 02/26/2012  . GERD (gastroesophageal reflux disease) 02/26/2012    Past Surgical History:  Procedure Laterality Date  . BACK SURGERY  1985  . CARDIAC CATHETERIZATION  2010   6 stents total  . CARDIAC CATHETERIZATION  01/2000   percutaneous transluminal coronary balloon angioplasty of mid RCA stenotic lesion  . CARDIAC CATHETERIZATION  06/2006   no stenting; ischemic cardiomyopathy, EF 40-45%  . CARDIOVERSION N/A 07/28/2016   Procedure: CARDIOVERSION;  Surgeon: Troy Sine, MD;  Location: [place];  Service: Cardiovascular;  Laterality: N/A;  . CORONARY ANGIOPLASTY  09/1998   mid-distal RCA balloon dilatation, 4.5 & 5.0 stents   . CORONARY ANGIOPLASTY WITH STENT PLACEMENT  03/1994   angioplasty & stenting (non-DES) of circumflex/prox ramus intermedius  . CORONARY ANGIOPLASTY WITH STENT PLACEMENT  10/1994   large iliac [id number] stent to RCA  .  CORONARY ANGIOPLASTY WITH STENT PLACEMENT  12/2002   4.31mm stents x2 of RCA  . CORONARY ANGIOPLASTY WITH STENT PLACEMENT  01/2005   cutting balloon arthrectomy of distal RCA & Cypher DES 3.5x13; cutting balloon arthrectomy of mid RCA with Cypher DES 3.5x18  . CORONARY ANGIOPLASTY WITH STENT PLACEMENT  11/2008   stenting of mid RCA with 4.0x76mm driver, non-DES  . LEFT HEART CATHETERIZATION WITH CORONARY ANGIOGRAM N/A 02/27/2012   Procedure: LEFT HEART CATHETERIZATION WITH CORONARY ANGIOGRAM;  Surgeon: Lorretta Harp, MD;  Location: [facility] CATH LAB;  Service: Cardiovascular;  Laterality: N/A;  . TRANSTHORACIC ECHOCARDIOGRAM  07/29/2010    EF 50=55%, mod inf wall hypokinesis & mild post wall hypokinesis; LA mild-mod dilated; mild mitral annular calcif & mild MR; mild TR & elevated RV systolic pressure; AV mildly sclerotic; mild aortic root dilatation        Family History  Problem Relation Age of Onset  . Heart attack Father     Social History   Tobacco Use  . Smoking status: Former Smoker    Packs/day: 1.00    Years: 50.00    Pack years: 50.00    Types: Cigarettes    Quit date: 07/20/2016    Years since quitting: 3.6  . Smokeless tobacco: Never Used  Substance Use Topics  . Alcohol use: Yes    Alcohol/week: 0.0 standard drinks    Comment: occasionally  . Drug use: No    Home Medications Prior to Admission medications   Medication Sig Start Date End Date Taking? Authorizing Provider  albuterol (PROVENTIL HFA;VENTOLIN HFA) 108 (90 Base) MCG/ACT inhaler Inhale 2 puffs into the lungs every 6 (six) hours as needed for wheezing or shortness of breath. 05/30/18  Yes Delsa Grana, PA-C  aspirin 81 MG tablet Take 81 mg by mouth at bedtime.    Yes [provider]  ELIQUIS 5 MG TABS tablet TAKE 1 TABLET BY MOUTH TWICE A DAY Patient taking differently: Take 5 mg by mouth 2 (two) times daily.  10/13/19  Yes Troy Sine, MD  ENTRESTO 49-51 MG TAKE 1 TABLET BY MOUTH TWICE A DAY Patient taking differently: Take 1 tablet by mouth in the morning and at bedtime.  12/11/19  Yes [name], MD  Fluticasone Furoate (ARNUITY ELLIPTA) 200 MCG/ACT AEPB INHALE 1 PUFF BY MOUTH EVERY DAY Patient taking differently: Inhale 1 puff into the lungs daily.  01/30/20  Yes Icard, Bradley L, DO  insulin aspart (NOVOLOG FLEXPEN) 100 UNIT/ML FlexPen 5-20 units with lunch and dinner Patient taking differently: Inject 5-6 Units into the skin 3 (three) times daily as needed for high blood sugar.  09/16/19  Yes [name], MD  Insulin Detemir (LEVEMIR FLEXTOUCH) 100 UNIT/ML Pen INJECT 46 UNITS INTO THE SKIN DAILY. Dx:E11.9 Patient  taking differently: Inject 46 Units into the skin at bedtime.  08/15/19  Yes PickardCammie Mcgee, MD  JARDIANCE 25 MG TABS tablet TAKE 1 TABLET BY MOUTH EVERY DAY Patient taking differently: Take 25 mg by mouth at bedtime.  12/11/19  Yes [name], MD  montelukast (SINGULAIR) 10 MG tablet TAKE 1 TABLET BY MOUTH EVERY DAY Patient taking differently: Take 10 mg by mouth in the morning.  09/23/19  Yes [name], MD  nebivolol (BYSTOLIC) 2.5 MG tablet Take 1 tablet (2.5 mg total) by mouth daily. 10/01/19  Yes Troy Sine, MD  nitroGLYCERIN (NITROSTAT) 0.4 MG SL tablet PLACE 1 TABLET (0.4 MG TOTAL) UNDER THE TONGUE EVERY 5 (  FIVE) MINUTES AS NEEDED FOR CHEST PAIN. 05/06/18  Yes Troy Sine, MD  rosuvastatin (CRESTOR) 20 MG tablet Take 1 tablet (20 mg total) by mouth daily. Patient taking differently: Take 20 mg by mouth every evening.  10/17/19  Yes Troy Sine, MD  Insulin Pen Needle (NOVOFINE) 32G X 6 MM MISC 1 each by Other route daily. Patient not taking: Reported on 02/28/2020 08/19/19   [name], MD  [facility] ULTRA test strip USE TO CHECK BLOOD SUGAR 3 TIMES A DAY (E11.9) Patient not taking: Reported on 02/28/2020 01/21/20   [name], MD    Allergies    Benazepril, Fish allergy, and Omega-3 fatty acids  Review of Systems   Review of Systems  Constitutional: Positive for fatigue.  Cardiovascular: Positive for chest pain.  Neurological: Positive for weakness.  All other systems reviewed and are negative.   Physical Exam Updated Vital Signs BP (!) 119/103   Pulse 60   Temp 98.4 F (36.9 C) (Oral)   Resp 19   Ht 6' (1.829 m)   Wt 100.7 kg   SpO2 97%   BMI 30.11 kg/m   Physical Exam Vitals and nursing note reviewed.  Constitutional:      Appearance: He is well-developed.  HENT:     Head: Normocephalic and atraumatic.  Eyes:     Extraocular Movements: Extraocular movements intact.     Pupils: Pupils are equal, round, and reactive to light.    Cardiovascular:     Rate and Rhythm: Normal rate and regular rhythm.     Heart sounds: Normal heart sounds.  Pulmonary:     Effort: Pulmonary effort is normal.     Breath sounds: Normal breath sounds.  Abdominal:     General: Bowel sounds are normal.     Palpations: Abdomen is soft.  Musculoskeletal:        General: Normal range of motion.     Cervical back: Normal range of motion and neck supple.  Skin:    General: Skin is warm.     Capillary Refill: Capillary refill takes less than 2 seconds.  Neurological:     General: No focal deficit present.     Mental Status: He is alert and oriented to person, place, and time.  Psychiatric:        Mood and Affect: Mood normal.        Behavior: Behavior normal.     ED Results / Procedures / Treatments   Labs (all labs ordered are listed, but only abnormal results are displayed) Labs Reviewed  CBC WITH DIFFERENTIAL/PLATELET - Abnormal; Notable for the following components:      Result Value   WBC 10.8 (*)    RBC 5.90 (*)    Hemoglobin 17.7 (*)    HCT 55.4 (*)    Neutro Abs 8.2 (*)    Abs Immature Granulocytes 0.15 (*)    All other components within normal limits  COMPREHENSIVE METABOLIC PANEL - Abnormal; Notable for the following components:   Glucose, Bld 168 (*)    All other components within normal limits  LIPID PANEL - Abnormal; Notable for the following components:   HDL 35 (*)    All other components within normal limits  URINALYSIS, ROUTINE W REFLEX MICROSCOPIC - Abnormal; Notable for the following components:   Glucose, UA >=500 (*)    Hgb urine dipstick SMALL (*)    All other components within normal limits  TROPONIN I (HIGH SENSITIVITY) - Abnormal; Notable  for the following components:   Troponin I (High Sensitivity) 38 (*)    All other components within normal limits  TROPONIN I (HIGH SENSITIVITY) - Abnormal; Notable for the following components:   Troponin I (High Sensitivity) 72 (*)    All other components  within normal limits  SARS CORONAVIRUS 2 BY RT PCR [facility] ORDER, [place] LAB)  PROTIME-INR  APTT  HEMOGLOBIN A1C    EKG EKG Interpretation  Date/Time:  Saturday February 28 2020 18:51:25 EDT Ventricular Rate:  87 PR Interval:  178 QRS Duration: 102 QT Interval:  366 QTC Calculation: 440 R Axis:   -23 Text Interpretation: Normal sinus rhythm Moderate voltage criteria for LVH, may be normal variant ( R in aVL , Cornell product ) Inferior infarct , possibly acute Cannot rule out Anterior infarct , age undetermined T wave abnormality, consider lateral ischemia No significant change since last tracing Confirmed by Isla Pence [phone]) on 02/28/2020 7:42:40 PM   Radiology DG Chest Port 1 View  Result Date: 02/28/2020 CLINICAL DATA:  Lightheadedness, nausea, back pain and shortness of breath starting today while washing a car. Previous cardiac history. EXAM: PORTABLE CHEST 1 VIEW COMPARISON:  CT chest 02/10/2020 FINDINGS: Cardiac enlargement. No vascular congestion, edema, or consolidation. No pleural effusions. No pneumothorax. Mild elevation of the left hemidiaphragm is unchanged. Mediastinal contours appear intact. IMPRESSION: Cardiac enlargement.  No evidence of active pulmonary disease. Electronically Signed   By: Lucienne Capers M.D.   On: 02/28/2020 19:59   CT Angio Chest/Abd/Pel for Dissection W and/or Wo Contrast  Result Date: 02/28/2020 CLINICAL DATA:  Chest pain while washing car, lightheadedness, nausea and back pain, hypotensive at home, extensive cardiac history with multiple stents EXAM: CT ANGIOGRAPHY CHEST, ABDOMEN AND PELVIS TECHNIQUE: Non-contrast CT of the chest was initially obtained. Multidetector CT imaging through the chest, abdomen and pelvis was performed using the standard protocol during bolus administration of intravenous contrast. Multiplanar reconstructed images and MIPs were obtained and reviewed to evaluate the vascular anatomy.  CONTRAST:  151mL OMNIPAQUE IOHEXOL 350 MG/ML SOLN COMPARISON:  CT chest 07/29/2019 FINDINGS: CTA CHEST FINDINGS Cardiovascular: Initial noncontrast CT imaging of the chest reveals a mildly dilated ascending thoracic aorta up to 4.1 cm with at most minimal atherosclerotic plaque. No hyperdense mural thickening or plaque displacement present to suggest intramural hematoma. Postcontrast administration there is satisfactory preferential opacification of the thoracic aorta. Redemonstration of the mildly dilated ascending thoracic aorta to 4.1 cm returning to a normal caliber at the level of the distal aortic arch of approximately 2.8 cm. No acute luminal abnormality of the imaged aorta. No periaortic stranding or hemorrhage. Normal 3 vessel branching of the aortic arch. Proximal great vessels are unremarkable and free of acute luminal abnormality. Central pulmonary arteries are normal caliber. No large central filling defects on this non tailored, suboptimally opacified examination of the pulmonary arteries. Normal heart size. No pericardial effusion. Three-vessel coronary artery calcifications are present with multiple coronary stents. No pericardial effusion. Mediastinum/Nodes: No mediastinal fluid or gas. Normal thyroid gland and thoracic inlet. No acute abnormality of the trachea or esophagus. No worrisome mediastinal, hilar or axillary adenopathy. Lungs/Pleura: There is a triangular/geometric juxtapleural nodule in the right middle lobe abutting the minor fissure measuring up to 6 mm in size (7/86). Additional clustered sub 3 mm solid nodules are seen in the superior lingula (7/61). No other concerning pulmonary nodules or masses. Mild dependent atelectatic changes are present. No consolidation, features of edema, pneumothorax, or effusion.  Musculoskeletal: The osseous structures appear diffusely demineralized which may limit detection of small or nondisplaced fractures. No acute osseous abnormality or suspicious  osseous lesion. Multilevel degenerative changes are present in the imaged portions of the spine. Multilevel flowing anterior osteophytosis, compatible with features of diffuse idiopathic skeletal hyperostosis (DISH). Additional degenerative changes present in the bilateral shoulders. Review of the MIP images confirms the above findings. CTA ABDOMEN AND PELVIS FINDINGS VASCULAR Aorta: Calcified and noncalcified atheromatous plaque throughout the normal caliber abdominal aorta. No significant aneurysmal dilatation is seen. No acute luminal abnormality of the imaged aorta. No periaortic stranding or hemorrhage. Celiac: Mild ostial plaque narrowing. Otherwise normally opacified without other significant stenosis or occlusion. Typical branching pattern. No dissection, aneurysm or evidence of vasculitis. SMA: At most mild ostial plaque narrowing. Otherwise normally opacified without significant stenosis, occlusion or acute luminal abnormality or features of vasculitis. Renals: Single renal arteries bilaterally. Minimal plaque within the proximal right renal artery. No significant stenosis, occlusion, acute luminal abnormality or features of fibromuscular dysplasia or vasculitis. IMA: Moderate to severe ostial plaque narrowing at the IMA origin with the distal vessel normally opacified. No acute luminal abnormalities are evident. No aneurysm, ectasia or features of vasculitis. Inflow: Atheromatous plaque throughout the common, internal external iliac arteries. Minimal if any significant luminal narrowing. No acute luminal abnormalities. No features of vasculitis. Proximal outflow vessels including the common femoral, profundus femoral and superficial femoral arteries demonstrate some mild atheromatous plaque but are otherwise normally opacified and widely patent without acute luminal abnormality or features of vasculitis. Veins: Major venous structures are unremarkable within the limitations of this arterial phase  examination. Review of the MIP images confirms the above findings. NON-VASCULAR Hepatobiliary: No worrisome focal liver lesions. Smooth liver surface contour. Normal hepatic attenuation. Normal gallbladder and biliary tree without visible calcified gallstone. Pancreas: Partial fatty replacement of the pancreas. No pancreatic ductal dilatation or surrounding inflammatory changes. Spleen: Small accessory splenule near the pancreatic tail. Heterogeneous enhancement of the splenic parenchyma is quite typical for arterial phase of imaging. No splenomegaly or concerning focal splenic lesion. Adrenals/Urinary Tract: Normal adrenal glands. Kidneys enhance symmetrically and uniformly. There is a 3 cm fluid attenuation cyst arising from the upper pole right kidney. No concerning renal mass. No urolithiasis, hydronephrosis or concerning renal lesions. Urinary bladder is unremarkable. Stomach/Bowel: Stomach and duodenum are unremarkable. No small bowel thickening or dilatation. Some fecalization of the distal small bowel contents is nonspecific but could reflect some slowed intestinal transit without evidence of mechanical obstruction. Normal appendix in the right lower quadrant. No colonic dilatation or wall thickening. Scattered colonic diverticula without focal inflammation to suggest diverticulitis. Lymphatic: No suspicious or enlarged lymph nodes in the included lymphatic chains. Reproductive: Prostatomegaly with few coarse eccentric calcifications of prostate. No concerning focal lesions. Other: No abdominopelvic free fluid or free gas. No bowel containing hernias. Fat bilateral inguinal hernias, left greater than right. Focal subcutaneous skin thickening along the anterior abdominal wall likely reflecting prior injectable use. Mild posterior body wall edema. Musculoskeletal: Asymmetric fatty atrophy of the left vastus musculature, nonspecific. The osseous structures appear diffusely demineralized which may limit  detection of small or nondisplaced fractures. No acute osseous abnormality or suspicious osseous lesion. Multilevel degenerative changes are present in the imaged portions of the spine. Near complete ankylosis of the bilateral SI joints as well as a transitional lumbosacral vertebrae. Stable mild-to-moderate degenerative changes in the bilateral hips. Review of the MIP images confirms the above findings. IMPRESSION: VASCULAR 1. No evidence of  acute aortic syndrome. 2. Aneurysmal dilatation of the ascending thoracic aorta up to 4.1 cm. Recommend annual imaging followup by CTA or MRA. This recommendation follows 2010 ACCF/AHA/AATS/ACR/ASA/SCA/SCAI/SIR/STS/SVM Guidelines for the Diagnosis and Management of Patients with Thoracic Aortic Disease. Circulation. 2010; 121: Y924-M628. Aortic aneurysm NOS (ICD10-I71.9) 3.  Aortic Atherosclerosis (ICD10-I70.0). 4. Moderate to severe plaque narrowing of the IMA origin. More mild plaque narrowing at the celiac, SMA and right renal artery origins. 5. Three-vessel coronary artery calcifications with multiple coronary stents. NONVASCULAR 1. No acute intrathoracic or intra-abdominal process. 2. Clustered appearance of several sub 5 mm solid nodules likely reflecting resolution of a larger more coalescent lesion previously seen in the lingula. Favoring a benign post infectious or inflammatory process. 3. Additional benign geometric nodule in the right middle lobe, favor intrapulmonary lymph node. 4. Scattered colonic diverticula without evidence of diverticulitis. 5. Some fecalization of the distal small bowel contents is nonspecific but could reflect some slowed intestinal transit without evidence of mechanical obstruction. 6. Prostatomegaly. Correlate for clinical features of outlet obstruction. Electronically Signed   By: Lovena Le M.D.   On: 02/28/2020 22:40    Procedures Procedures (including critical care time)  Medications Ordered in ED Medications  sodium chloride  0.9 % bolus 1,000 mL (0 mLs Intravenous Stopped 02/28/20 2306)  iohexol (OMNIPAQUE) 350 MG/ML injection 100 mL (100 mLs Intravenous Contrast Given 02/28/20 2202)  sodium chloride 0.9 % bolus 1,000 mL (1,000 mLs Intravenous New Bag/Given 02/28/20 2319)    ED Course  I have reviewed the triage vital signs and the nursing notes.  Pertinent labs & imaging results that were available during my care of the patient were reviewed by me and considered in my medical decision making (see chart for details).    MDM Rules/Calculators/A&P                          Pt continues to be chest pain free.  BP is still low after 1 L, so an additional L given.  CT dissection shows no dissection.  Troponin is slowly climbing from 38 to 72.  Pt d/w the cards fellow.  He recommended admission here with continued trending of troponin.  Cards will see in the morning.  Pt d/w Dr. Josephine Cables (triad) for admission.  Final Clinical Impression(s) / ED Diagnoses Final diagnoses:  Chest pain, unspecified type  Hypotension, unspecified hypotension type  Near syncope    Rx / DC Orders ED Discharge Orders    None       Isla Pence, MD 02/28/20 2351

## 2020-02-29 ENCOUNTER — Encounter (HOSPITAL_COMMUNITY): Payer: Self-pay | Admitting: Internal Medicine

## 2020-02-29 DIAGNOSIS — I712 Thoracic aortic aneurysm, without rupture, unspecified: Secondary | ICD-10-CM

## 2020-02-29 DIAGNOSIS — I4892 Unspecified atrial flutter: Secondary | ICD-10-CM | POA: Diagnosis not present

## 2020-02-29 DIAGNOSIS — E1165 Type 2 diabetes mellitus with hyperglycemia: Secondary | ICD-10-CM | POA: Diagnosis not present

## 2020-02-29 DIAGNOSIS — Z79899 Other long term (current) drug therapy: Secondary | ICD-10-CM | POA: Diagnosis not present

## 2020-02-29 DIAGNOSIS — Z20822 Contact with and (suspected) exposure to covid-19: Secondary | ICD-10-CM | POA: Diagnosis not present

## 2020-02-29 DIAGNOSIS — G4733 Obstructive sleep apnea (adult) (pediatric): Secondary | ICD-10-CM | POA: Diagnosis not present

## 2020-02-29 DIAGNOSIS — I252 Old myocardial infarction: Secondary | ICD-10-CM | POA: Diagnosis not present

## 2020-02-29 DIAGNOSIS — I214 Non-ST elevation (NSTEMI) myocardial infarction: Secondary | ICD-10-CM | POA: Diagnosis not present

## 2020-02-29 DIAGNOSIS — J449 Chronic obstructive pulmonary disease, unspecified: Secondary | ICD-10-CM | POA: Diagnosis present

## 2020-02-29 DIAGNOSIS — R778 Other specified abnormalities of plasma proteins: Secondary | ICD-10-CM | POA: Diagnosis not present

## 2020-02-29 DIAGNOSIS — I959 Hypotension, unspecified: Secondary | ICD-10-CM

## 2020-02-29 DIAGNOSIS — E119 Type 2 diabetes mellitus without complications: Secondary | ICD-10-CM | POA: Diagnosis not present

## 2020-02-29 DIAGNOSIS — Z8249 Family history of ischemic heart disease and other diseases of the circulatory system: Secondary | ICD-10-CM | POA: Diagnosis not present

## 2020-02-29 DIAGNOSIS — K219 Gastro-esophageal reflux disease without esophagitis: Secondary | ICD-10-CM

## 2020-02-29 DIAGNOSIS — I2510 Atherosclerotic heart disease of native coronary artery without angina pectoris: Secondary | ICD-10-CM | POA: Diagnosis not present

## 2020-02-29 DIAGNOSIS — I10 Essential (primary) hypertension: Secondary | ICD-10-CM | POA: Diagnosis not present

## 2020-02-29 DIAGNOSIS — Z794 Long term (current) use of insulin: Secondary | ICD-10-CM | POA: Diagnosis not present

## 2020-02-29 DIAGNOSIS — Z7901 Long term (current) use of anticoagulants: Secondary | ICD-10-CM | POA: Diagnosis not present

## 2020-02-29 DIAGNOSIS — Z87891 Personal history of nicotine dependence: Secondary | ICD-10-CM | POA: Diagnosis not present

## 2020-02-29 DIAGNOSIS — E782 Mixed hyperlipidemia: Secondary | ICD-10-CM | POA: Diagnosis not present

## 2020-02-29 DIAGNOSIS — E669 Obesity, unspecified: Secondary | ICD-10-CM

## 2020-02-29 DIAGNOSIS — R079 Chest pain, unspecified: Secondary | ICD-10-CM | POA: Diagnosis not present

## 2020-02-29 DIAGNOSIS — I25118 Atherosclerotic heart disease of native coronary artery with other forms of angina pectoris: Secondary | ICD-10-CM | POA: Diagnosis not present

## 2020-02-29 DIAGNOSIS — T82855A Stenosis of coronary artery stent, initial encounter: Secondary | ICD-10-CM | POA: Diagnosis not present

## 2020-02-29 DIAGNOSIS — J418 Mixed simple and mucopurulent chronic bronchitis: Secondary | ICD-10-CM

## 2020-02-29 DIAGNOSIS — Z888 Allergy status to other drugs, medicaments and biological substances status: Secondary | ICD-10-CM | POA: Diagnosis not present

## 2020-02-29 DIAGNOSIS — H905 Unspecified sensorineural hearing loss: Secondary | ICD-10-CM | POA: Diagnosis not present

## 2020-02-29 DIAGNOSIS — Z955 Presence of coronary angioplasty implant and graft: Secondary | ICD-10-CM | POA: Diagnosis not present

## 2020-02-29 DIAGNOSIS — Z91013 Allergy to seafood: Secondary | ICD-10-CM | POA: Diagnosis not present

## 2020-02-29 DIAGNOSIS — E785 Hyperlipidemia, unspecified: Secondary | ICD-10-CM

## 2020-02-29 DIAGNOSIS — R55 Syncope and collapse: Secondary | ICD-10-CM | POA: Diagnosis not present

## 2020-02-29 DIAGNOSIS — Z7982 Long term (current) use of aspirin: Secondary | ICD-10-CM | POA: Diagnosis not present

## 2020-02-29 LAB — CBG MONITORING, ED
Glucose-Capillary: 131 mg/dL — ABNORMAL HIGH (ref 70–99)
Glucose-Capillary: 100 mg/dL — ABNORMAL HIGH (ref 70–99)

## 2020-02-29 LAB — APTT
aPTT: 31 seconds (ref 24–36)
aPTT: 77 seconds — ABNORMAL HIGH (ref 24–36)

## 2020-02-29 LAB — HEMOGLOBIN A1C
Hgb A1c MFr Bld: 7.7 % — ABNORMAL HIGH (ref 4.8–5.6)
Mean Plasma Glucose: 174.29 mg/dL

## 2020-02-29 LAB — PROTIME-INR
INR: 1.3 — ABNORMAL HIGH (ref 0.8–1.2)
Prothrombin Time: 15.3 seconds — ABNORMAL HIGH (ref 11.4–15.2)

## 2020-02-29 LAB — HEPARIN LEVEL (UNFRACTIONATED): Heparin Unfractionated: >2.2 IU/mL — ABNORMAL HIGH (ref 0.30–0.70)

## 2020-02-29 LAB — SARS CORONAVIRUS 2 BY RT PCR (HOSPITAL ORDER, PERFORMED IN ~~LOC~~ HOSPITAL LAB): SARS Coronavirus 2: NEGATIVE

## 2020-02-29 LAB — GLUCOSE, CAPILLARY
Glucose-Capillary: 129 mg/dL — ABNORMAL HIGH (ref 70–99)
Glucose-Capillary: 127 mg/dL — ABNORMAL HIGH (ref 70–99)

## 2020-02-29 MED ORDER — FLUTICASONE FUROATE 200 MCG/ACT IN AEPB: 1.0000 | INHALATION_SPRAY | Freq: Every day | RESPIRATORY_TRACT | Status: DC

## 2020-02-29 MED ORDER — ACETAMINOPHEN 325 MG PO TABS: 650.0000 mg | ORAL_TABLET | ORAL | Status: DC | PRN

## 2020-02-29 MED ORDER — ROSUVASTATIN CALCIUM 20 MG PO TABS
20.0000 mg | ORAL_TABLET | Freq: Every evening | ORAL | Status: DC
  Administered 2020-02-29 – 2020-03-01 (×2): 20 mg via ORAL
  Filled 2020-02-29 (×3): qty 1

## 2020-02-29 MED ORDER — INSULIN ASPART 100 UNIT/ML ~~LOC~~ SOLN: 0.0000 [IU] | Freq: Every day | SUBCUTANEOUS | Status: DC

## 2020-02-29 MED ORDER — ASPIRIN EC 81 MG PO TBEC
81.0000 mg | DELAYED_RELEASE_TABLET | Freq: Every day | ORAL | Status: DC
  Administered 2020-02-29 – 2020-03-01 (×3): 81 mg via ORAL
  Filled 2020-02-29 (×3): qty 1

## 2020-02-29 MED ORDER — BUDESONIDE 0.5 MG/2ML IN SUSP
0.5000 mg | Freq: Two times a day (BID) | RESPIRATORY_TRACT | Status: DC
  Administered 2020-02-29 – 2020-03-02 (×5): 0.5 mg via RESPIRATORY_TRACT
  Filled 2020-02-29 (×5): qty 2

## 2020-02-29 MED ORDER — ALBUTEROL SULFATE HFA 108 (90 BASE) MCG/ACT IN AERS
2.0000 | INHALATION_SPRAY | Freq: Four times a day (QID) | RESPIRATORY_TRACT | Status: DC | PRN
  Filled 2020-02-29: qty 6.7

## 2020-02-29 MED ORDER — ONDANSETRON HCL 4 MG/2ML IJ SOLN: 4.0000 mg | Freq: Four times a day (QID) | INTRAMUSCULAR | Status: DC | PRN

## 2020-02-29 MED ORDER — INSULIN DETEMIR 100 UNIT/ML ~~LOC~~ SOLN
30.0000 [IU] | Freq: Every day | SUBCUTANEOUS | Status: DC
  Administered 2020-02-29: 30 [IU] via SUBCUTANEOUS
  Filled 2020-02-29: qty 3

## 2020-02-29 MED ORDER — NITROGLYCERIN 0.4 MG SL SUBL: 0.4000 mg | SUBLINGUAL_TABLET | SUBLINGUAL | Status: DC | PRN

## 2020-02-29 MED ORDER — NEBIVOLOL HCL 2.5 MG PO TABS
2.5000 mg | ORAL_TABLET | Freq: Every day | ORAL | Status: DC
  Administered 2020-02-29 – 2020-03-02 (×3): 2.5 mg via ORAL
  Filled 2020-02-29 (×5): qty 1

## 2020-02-29 MED ORDER — MONTELUKAST SODIUM 10 MG PO TABS
10.0000 mg | ORAL_TABLET | Freq: Every morning | ORAL | Status: DC
  Administered 2020-02-29 – 2020-03-02 (×3): 10 mg via ORAL
  Filled 2020-02-29 (×4): qty 1

## 2020-02-29 MED ORDER — HEPARIN (PORCINE) 25000 UT/250ML-% IV SOLN: 1250.0000 [IU]/h | INTRAVENOUS | Status: DC

## 2020-02-29 MED ORDER — INSULIN ASPART 100 UNIT/ML ~~LOC~~ SOLN
0.0000 [IU] | Freq: Three times a day (TID) | SUBCUTANEOUS | Status: DC
  Administered 2020-02-29 (×2): 2 [IU] via SUBCUTANEOUS
  Administered 2020-03-01: 3 [IU] via SUBCUTANEOUS
  Filled 2020-02-29: qty 1

## 2020-02-29 MED ORDER — APIXABAN 5 MG PO TABS
5.0000 mg | ORAL_TABLET | Freq: Two times a day (BID) | ORAL | Status: DC
  Administered 2020-02-29: 5 mg via ORAL
  Filled 2020-02-29: qty 1

## 2020-02-29 MED ORDER — HEPARIN (PORCINE) 25000 UT/250ML-% IV SOLN
1400.0000 [IU]/h | INTRAVENOUS | Status: DC
  Administered 2020-03-01: 1200 [IU]/h via INTRAVENOUS
  Administered 2020-03-02: 1400 [IU]/h via INTRAVENOUS
  Filled 2020-02-29 (×2): qty 250

## 2020-02-29 MED ORDER — HEPARIN (PORCINE) 25000 UT/250ML-% IV SOLN
1450.0000 [IU]/h | INTRAVENOUS | Status: DC
  Administered 2020-02-29: 1450 [IU]/h via INTRAVENOUS
  Filled 2020-02-29: qty 250

## 2020-02-29 NOTE — H&P (Addendum)
History and Physical  ABDIEL BLACKERBY LPF:[id number] DOB: 1951/06/16 DOA: 02/28/2020  Referring physician: Isla Pence, MD PCP: [name], MD  Patient coming from: Home  Chief Complaint: Chest pain  HPI: John [name] is a 69 y.o. male with medical history significant for hypertension, hyperlipidemia, CAD s/p stent placement, GERD, type II DM who presents to the emergency department due to left-sided pressure-like, nonreproducible chest pain with radiation to the neck and with sensation of near fainting which occurred while he was washing his car few hours prior to arrival to the ED.  This was associated with cold sweats. Patient also complained of pain in between the shoulder.  Patient sat down on his recliner and on checking his blood pressure he noted that BP was 71/60 and a repeat BP was 80/70.  Chest pain was rated as 5/10 on pain scale, he took nitroglycerin and chest pain resolved about 7 minutes after.  Patient states that he had similar episode while mowing his lawn last week whereby he noted that his BP dropped as well.  He denies vomiting, abdominal pain, fever, chills.  ED Course:  In the emergency department, BP was 97/70 on arrival and other vital signs are within normal range.  Work-up in the ED showed WBC 10.8, H/H 17.7/54.4, blood glucose was 168.  Troponin x2 38>72.  SARS coronavirus 2 was negative.  CT angiography chest, abdomen and pelvis showed no evidence of acute aortic syndrome and no acute intrathoracic or intra-abdominal process was noted.  Chest x-ray showed cardiac enlargement with no evidence of active pulmonary disease.  IV hydration was provided in the ED.  ED physician contacted [facility]es cardiology fellow who recommended patient to be admitted here with continued trending of troponin and with plan for cardiology to see patient in the morning.  Hospitalist was asked to admit.  For further evaluation and management.  Review of Systems: Constitutional:  Positive for fatigue.  Negative for chills and fever.  HENT: Negative for ear pain and sore throat.   Eyes: Negative for pain and visual disturbance.  Respiratory: Negative for cough, chest tightness and shortness of breath.   Cardiovascular: Positive for for chest pain.  Negative for palpitations.  Gastrointestinal: Negative for abdominal pain and vomiting.  Endocrine: Negative for polyphagia and polyuria.  Genitourinary: Negative for decreased urine volume, dysuria Musculoskeletal: Negative for arthralgias and back pain.  Skin: Negative for color change and rash.  Allergic/Immunologic: Negative for immunocompromised state.  Neurological: Positive for lightheadedness, weakness.  Negative for tremors, syncope, speech difficulty Hematological: Does not bruise/bleed easily.  All other systems reviewed and are negative   Past Medical History:  Diagnosis Date  . Atrial flutter ([place])    s/p cardioversion  . Coronary artery disease   . Diabetes mellitus   . GERD (gastroesophageal reflux disease)   . History of nuclear stress test 04/04/2011   lexiscan; mod-large in size fixed inferolateral defect (scar); non-diagnostic for ischemia; low risk scan   . Hyperlipidemia   . Hypertension   . Left foot drop    r/t past disk srugery - uses Kevlar brace  . Myocardial infarction ([place])    posterior MI  . Shortness of breath   . Sleep apnea    on CPAP; 04/28/2007 split-night - AHI during total sleep 44.43/hr and REM 72.56/hr   Past Surgical History:  Procedure Laterality Date  . BACK SURGERY  1985  . CARDIAC CATHETERIZATION  2010   6 stents total  . CARDIAC CATHETERIZATION  01/2000   percutaneous transluminal coronary balloon angioplasty of mid RCA stenotic lesion  . CARDIAC CATHETERIZATION  06/2006   no stenting; ischemic cardiomyopathy, EF 40-45%  . CARDIOVERSION N/A 07/28/2016   Procedure: CARDIOVERSION;  Surgeon: Troy Sine, MD;  Location: [facility];  Service: Cardiovascular;   Laterality: N/A;  . CORONARY ANGIOPLASTY  09/1998   mid-distal RCA balloon dilatation, 4.5 & 5.0 stents   . CORONARY ANGIOPLASTY WITH STENT PLACEMENT  03/1994   angioplasty & stenting (non-DES) of circumflex/prox ramus intermedius  . CORONARY ANGIOPLASTY WITH STENT PLACEMENT  10/1994   large iliac [id number] stent to RCA  . CORONARY ANGIOPLASTY WITH STENT PLACEMENT  12/2002   4.72mm stents x2 of RCA  . CORONARY ANGIOPLASTY WITH STENT PLACEMENT  01/2005   cutting balloon arthrectomy of distal RCA & Cypher DES 3.5x13; cutting balloon arthrectomy of mid RCA with Cypher DES 3.5x18  . CORONARY ANGIOPLASTY WITH STENT PLACEMENT  11/2008   stenting of mid RCA with 4.0x73mm driver, non-DES  . LEFT HEART CATHETERIZATION WITH CORONARY ANGIOGRAM N/A 02/27/2012   Procedure: LEFT HEART CATHETERIZATION WITH CORONARY ANGIOGRAM;  Surgeon: Lorretta Harp, MD;  Location: [facility] CATH LAB;  Service: Cardiovascular;  Laterality: N/A;  . TRANSTHORACIC ECHOCARDIOGRAM  07/29/2010   EF 50=55%, mod inf wall hypokinesis & mild post wall hypokinesis; LA mild-mod dilated; mild mitral annular calcif & mild MR; mild TR & elevated RV systolic pressure; AV mildly sclerotic; mild aortic root dilatation     Social History:  reports that he quit smoking about 3 years ago. His smoking use included cigarettes. He has a 50.00 pack-year smoking history. He has never used smokeless tobacco. He reports current alcohol use. He reports that he does not use drugs.   Allergies  Allergen Reactions  . Benazepril Other (See Comments)    hyperkalemia  . Fish Allergy Itching  . Omega-3 Fatty Acids Hives and Itching    Family History  Problem Relation Age of Onset  . Heart attack Father     Prior to Admission medications   Medication Sig Start Date End Date Taking? Authorizing Provider  albuterol (PROVENTIL HFA;VENTOLIN HFA) 108 (90 Base) MCG/ACT inhaler Inhale 2 puffs into the lungs every 6 (six) hours as needed for wheezing or shortness of  breath. 05/30/18  Yes Delsa Grana, PA-C  aspirin 81 MG tablet Take 81 mg by mouth at bedtime.    Yes [provider]  ELIQUIS 5 MG TABS tablet TAKE 1 TABLET BY MOUTH TWICE A DAY Patient taking differently: Take 5 mg by mouth 2 (two) times daily.  10/13/19  Yes Troy Sine, MD  ENTRESTO 49-51 MG TAKE 1 TABLET BY MOUTH TWICE A DAY Patient taking differently: Take 1 tablet by mouth in the morning and at bedtime.  12/11/19  Yes [name], MD  Fluticasone Furoate (ARNUITY ELLIPTA) 200 MCG/ACT AEPB INHALE 1 PUFF BY MOUTH EVERY DAY Patient taking differently: Inhale 1 puff into the lungs daily.  01/30/20  Yes Icard, Bradley L, DO  insulin aspart (NOVOLOG FLEXPEN) 100 UNIT/ML FlexPen 5-20 units with lunch and dinner Patient taking differently: Inject 5-6 Units into the skin 3 (three) times daily as needed for high blood sugar.  09/16/19  Yes [name], MD  Insulin Detemir (LEVEMIR FLEXTOUCH) 100 UNIT/ML Pen INJECT 46 UNITS INTO THE SKIN DAILY. Dx:E11.9 Patient taking differently: Inject 46 Units into the skin at bedtime.  08/15/19  Yes [name], MD  JARDIANCE 25 MG TABS tablet TAKE  1 TABLET BY MOUTH EVERY DAY Patient taking differently: Take 25 mg by mouth at bedtime.  12/11/19  Yes [name], MD  montelukast (SINGULAIR) 10 MG tablet TAKE 1 TABLET BY MOUTH EVERY DAY Patient taking differently: Take 10 mg by mouth in the morning.  09/23/19  Yes [name], MD  nebivolol (BYSTOLIC) 2.5 MG tablet Take 1 tablet (2.5 mg total) by mouth daily. 10/01/19  Yes Troy Sine, MD  nitroGLYCERIN (NITROSTAT) 0.4 MG SL tablet PLACE 1 TABLET (0.4 MG TOTAL) UNDER THE TONGUE EVERY 5 (FIVE) MINUTES AS NEEDED FOR CHEST PAIN. 05/06/18  Yes Troy Sine, MD  rosuvastatin (CRESTOR) 20 MG tablet Take 1 tablet (20 mg total) by mouth daily. Patient taking differently: Take 20 mg by mouth every evening.  10/17/19  Yes Troy Sine, MD  Insulin Pen Needle (NOVOFINE) 32G X 6 MM  MISC 1 each by Other route daily. Patient not taking: Reported on 02/28/2020 08/19/19   [name], MD  [facility] ULTRA test strip USE TO CHECK BLOOD SUGAR 3 TIMES A DAY (E11.9) Patient not taking: Reported on 02/28/2020 01/21/20   [name], MD    Physical Exam: BP 103/81   Pulse (!) 56   Temp 98.4 F (36.9 C) (Oral)   Resp 18   Ht 6' (1.829 m)   Wt 100.7 kg   SpO2 100%   BMI 30.11 kg/m   . General: 69 y.o. year-old male well developed well nourished in no acute distress.  Alert and oriented x3. [name] HEENT: NCAT, EOMI . Neck: Supple, trachea midline . Cardiovascular: Regular rate and rhythm with no rubs or gallops.  No thyromegaly or JVD noted.  No lower extremity edema. 2/4 pulses in all 4 extremities. [name] Respiratory: Clear to auscultation with no wheezes or rales. Good inspiratory effort. . Abdomen: Soft nontender nondistended with normal bowel sounds x4 quadrants. . Muskuloskeletal: No cyanosis, clubbing or edema noted bilaterally . Neuro: CN II-XII intact, strength, sensation, reflexes . Skin: No ulcerative lesions noted or rashes . Psychiatry: Judgement and insight appear normal. Mood is appropriate for condition and setting          Labs on Admission:  Basic Metabolic Panel: Recent Labs  Lab 02/28/20 1910  NA 143  K 4.1  CL 107  CO2 25  GLUCOSE 168*  BUN 23  CREATININE 1.15  CALCIUM 9.6   Liver Function Tests: Recent Labs  Lab 02/28/20 1910  AST 32  ALT 38  ALKPHOS 67  BILITOT 0.7  PROT 6.8  ALBUMIN 4.1   No results for input(s): LIPASE, AMYLASE in the last 168 hours. No results for input(s): AMMONIA in the last 168 hours. CBC: Recent Labs  Lab 02/28/20 1910  WBC 10.8*  NEUTROABS 8.2*  HGB 17.7*  HCT 55.4*  MCV 93.9  PLT 182   Cardiac Enzymes: No results for input(s): CKTOTAL, CKMB, CKMBINDEX, TROPONINI in the last 168 hours.  BNP (last 3 results) No results for input(s): BNP in the last 8760 hours.  ProBNP (last 3 results) No  results for input(s): PROBNP in the last 8760 hours.  CBG: No results for input(s): GLUCAP in the last 168 hours.  Radiological Exams on Admission: DG Chest Port 1 View  Result Date: 02/28/2020 CLINICAL DATA:  Lightheadedness, nausea, back pain and shortness of breath starting today while washing a car. Previous cardiac history. EXAM: PORTABLE CHEST 1 VIEW COMPARISON:  CT chest 02/10/2020 FINDINGS: Cardiac enlargement. No vascular congestion, edema,  or consolidation. No pleural effusions. No pneumothorax. Mild elevation of the left hemidiaphragm is unchanged. Mediastinal contours appear intact. IMPRESSION: Cardiac enlargement.  No evidence of active pulmonary disease. Electronically Signed   By: Lucienne Capers M.D.   On: 02/28/2020 19:59   CT Angio Chest/Abd/Pel for Dissection W and/or Wo Contrast  Result Date: 02/28/2020 CLINICAL DATA:  Chest pain while washing car, lightheadedness, nausea and back pain, hypotensive at home, extensive cardiac history with multiple stents EXAM: CT ANGIOGRAPHY CHEST, ABDOMEN AND PELVIS TECHNIQUE: Non-contrast CT of the chest was initially obtained. Multidetector CT imaging through the chest, abdomen and pelvis was performed using the standard protocol during bolus administration of intravenous contrast. Multiplanar reconstructed images and MIPs were obtained and reviewed to evaluate the vascular anatomy. CONTRAST:  16mL OMNIPAQUE IOHEXOL 350 MG/ML SOLN COMPARISON:  CT chest 07/29/2019 FINDINGS: CTA CHEST FINDINGS Cardiovascular: Initial noncontrast CT imaging of the chest reveals a mildly dilated ascending thoracic aorta up to 4.1 cm with at most minimal atherosclerotic plaque. No hyperdense mural thickening or plaque displacement present to suggest intramural hematoma. Postcontrast administration there is satisfactory preferential opacification of the thoracic aorta. Redemonstration of the mildly dilated ascending thoracic aorta to 4.1 cm returning to a normal caliber  at the level of the distal aortic arch of approximately 2.8 cm. No acute luminal abnormality of the imaged aorta. No periaortic stranding or hemorrhage. Normal 3 vessel branching of the aortic arch. Proximal great vessels are unremarkable and free of acute luminal abnormality. Central pulmonary arteries are normal caliber. No large central filling defects on this non tailored, suboptimally opacified examination of the pulmonary arteries. Normal heart size. No pericardial effusion. Three-vessel coronary artery calcifications are present with multiple coronary stents. No pericardial effusion. Mediastinum/Nodes: No mediastinal fluid or gas. Normal thyroid gland and thoracic inlet. No acute abnormality of the trachea or esophagus. No worrisome mediastinal, hilar or axillary adenopathy. Lungs/Pleura: There is a triangular/geometric juxtapleural nodule in the right middle lobe abutting the minor fissure measuring up to 6 mm in size (7/86). Additional clustered sub 3 mm solid nodules are seen in the superior lingula (7/61). No other concerning pulmonary nodules or masses. Mild dependent atelectatic changes are present. No consolidation, features of edema, pneumothorax, or effusion. Musculoskeletal: The osseous structures appear diffusely demineralized which may limit detection of small or nondisplaced fractures. No acute osseous abnormality or suspicious osseous lesion. Multilevel degenerative changes are present in the imaged portions of the spine. Multilevel flowing anterior osteophytosis, compatible with features of diffuse idiopathic skeletal hyperostosis (DISH). Additional degenerative changes present in the bilateral shoulders. Review of the MIP images confirms the above findings. CTA ABDOMEN AND PELVIS FINDINGS VASCULAR Aorta: Calcified and noncalcified atheromatous plaque throughout the normal caliber abdominal aorta. No significant aneurysmal dilatation is seen. No acute luminal abnormality of the imaged aorta. No  periaortic stranding or hemorrhage. Celiac: Mild ostial plaque narrowing. Otherwise normally opacified without other significant stenosis or occlusion. Typical branching pattern. No dissection, aneurysm or evidence of vasculitis. SMA: At most mild ostial plaque narrowing. Otherwise normally opacified without significant stenosis, occlusion or acute luminal abnormality or features of vasculitis. Renals: Single renal arteries bilaterally. Minimal plaque within the proximal right renal artery. No significant stenosis, occlusion, acute luminal abnormality or features of fibromuscular dysplasia or vasculitis. IMA: Moderate to severe ostial plaque narrowing at the IMA origin with the distal vessel normally opacified. No acute luminal abnormalities are evident. No aneurysm, ectasia or features of vasculitis. Inflow: Atheromatous plaque throughout the common, internal external iliac  arteries. Minimal if any significant luminal narrowing. No acute luminal abnormalities. No features of vasculitis. Proximal outflow vessels including the common femoral, profundus femoral and superficial femoral arteries demonstrate some mild atheromatous plaque but are otherwise normally opacified and widely patent without acute luminal abnormality or features of vasculitis. Veins: Major venous structures are unremarkable within the limitations of this arterial phase examination. Review of the MIP images confirms the above findings. NON-VASCULAR Hepatobiliary: No worrisome focal liver lesions. Smooth liver surface contour. Normal hepatic attenuation. Normal gallbladder and biliary tree without visible calcified gallstone. Pancreas: Partial fatty replacement of the pancreas. No pancreatic ductal dilatation or surrounding inflammatory changes. Spleen: Small accessory splenule near the pancreatic tail. Heterogeneous enhancement of the splenic parenchyma is quite typical for arterial phase of imaging. No splenomegaly or concerning focal splenic  lesion. Adrenals/Urinary Tract: Normal adrenal glands. Kidneys enhance symmetrically and uniformly. There is a 3 cm fluid attenuation cyst arising from the upper pole right kidney. No concerning renal mass. No urolithiasis, hydronephrosis or concerning renal lesions. Urinary bladder is unremarkable. Stomach/Bowel: Stomach and duodenum are unremarkable. No small bowel thickening or dilatation. Some fecalization of the distal small bowel contents is nonspecific but could reflect some slowed intestinal transit without evidence of mechanical obstruction. Normal appendix in the right lower quadrant. No colonic dilatation or wall thickening. Scattered colonic diverticula without focal inflammation to suggest diverticulitis. Lymphatic: No suspicious or enlarged lymph nodes in the included lymphatic chains. Reproductive: Prostatomegaly with few coarse eccentric calcifications of prostate. No concerning focal lesions. Other: No abdominopelvic free fluid or free gas. No bowel containing hernias. Fat bilateral inguinal hernias, left greater than right. Focal subcutaneous skin thickening along the anterior abdominal wall likely reflecting prior injectable use. Mild posterior body wall edema. Musculoskeletal: Asymmetric fatty atrophy of the left vastus musculature, nonspecific. The osseous structures appear diffusely demineralized which may limit detection of small or nondisplaced fractures. No acute osseous abnormality or suspicious osseous lesion. Multilevel degenerative changes are present in the imaged portions of the spine. Near complete ankylosis of the bilateral SI joints as well as a transitional lumbosacral vertebrae. Stable mild-to-moderate degenerative changes in the bilateral hips. Review of the MIP images confirms the above findings. IMPRESSION: VASCULAR 1. No evidence of acute aortic syndrome. 2. Aneurysmal dilatation of the ascending thoracic aorta up to 4.1 cm. Recommend annual imaging followup by CTA or MRA.  This recommendation follows 2010 ACCF/AHA/AATS/ACR/ASA/SCA/SCAI/SIR/STS/SVM Guidelines for the Diagnosis and Management of Patients with Thoracic Aortic Disease. Circulation. 2010; 121: B284-X324. Aortic aneurysm NOS (ICD10-I71.9) 3.  Aortic Atherosclerosis (ICD10-I70.0). 4. Moderate to severe plaque narrowing of the IMA origin. More mild plaque narrowing at the celiac, SMA and right renal artery origins. 5. Three-vessel coronary artery calcifications with multiple coronary stents. NONVASCULAR 1. No acute intrathoracic or intra-abdominal process. 2. Clustered appearance of several sub 5 mm solid nodules likely reflecting resolution of a larger more coalescent lesion previously seen in the lingula. Favoring a benign post infectious or inflammatory process. 3. Additional benign geometric nodule in the right middle lobe, favor intrapulmonary lymph node. 4. Scattered colonic diverticula without evidence of diverticulitis. 5. Some fecalization of the distal small bowel contents is nonspecific but could reflect some slowed intestinal transit without evidence of mechanical obstruction. 6. Prostatomegaly. Correlate for clinical features of outlet obstruction. Electronically Signed   By: Lovena Le M.D.   On: 02/28/2020 22:40    EKG: I independently viewed the EKG done and my findings are as followed: EKG showed normal sinus rhythm at a  rate of 87 bpm with T wave inversion in leads V5-V6 and ST depression in leads I and aVL  Assessment/Plan Present on Admission: . Chest pain . HTN (hypertension) . GERD (gastroesophageal reflux disease) . CAD, multiple prior RCA PCI's. Last cath 2010, Myoview low risk Oct 2012 . COPD (chronic obstructive pulmonary disease) ([place]) . Hyperlipidemia  Principal Problem:   Chest pain Active Problems:   CAD, multiple prior RCA PCI's. Last cath 2010, Myoview low risk Oct 2012   HTN (hypertension)   COPD (chronic obstructive pulmonary disease) ([facility])   GERD (gastroesophageal  reflux disease)   Hyperlipidemia   Hyperglycemia due to diabetes mellitus ([place])   Thoracic aortic aneurysm ([facility])   Hypotensive episode   Obesity (BMI 30.0-34.9)   Chest pain rule out ACS Cardiovascular risk factors include hypertension, type 2 diabetes mellitus, hyperlipidemia, obesity Patient will be placed in Observation status on  telemetry monitored unit Troponins x 2 38 > 72 Chest pain is resolved at this time EKG showed normal sinus rhythm at a rate of 87 bpm with T wave inversion in leads V5-V6 and ST depression in leads I and aVL Cardiology fellow was consulted by ED physician who recommended that patient should be admitted here with plan for cardiology to follow-up with patient in the morning to help decide if Stress test is needed Versus other diagnostic modalities.   Continue aspirin, nitroglycerin Heparin drip was started  Hypotensive episode Patient reported hypotensive episode in which BP dropped to 71/60 with associated symptoms as described in HPI.  He had similar episode about a week ago. Orthostatic BP will be checked IV hydration provided in the ED Patient states that his Bystolic was decreased to 2.5 mg daily in April of this year.  Hyperglycemia secondary to type 2 diabetes mellitus Continue insulin sliding scale and hypoglycemia protocol  Essential hypertension Hold Bystolic at this time due to soft blood pressure  GERD Continue Protonix  Hyperlipidemia Continue home statin  COPD (not in exacerbation) Continue Ventolin, fluticasone  CAD s/p multiple stent placement Continue statin, aspirin  Thoracic aortic aneurysm CT angiography of chest showed aneurysmal dilatation of the ascending thoracic aorta up to 4.1 cm, and on imaging follow-up by CTA or MRA recommended  Obesity (BMI 30.1) Patient was counseled for diet control, exercise regimen and weight loss.   DVT prophylaxis: Heparin drip  Code Status: Full code  Family Communication: None at  bedside  Disposition Plan:  Patient is from:                        home Anticipated DC to:                   Home Anticipated DC date:               1 day Anticipated DC barriers:          Patient unstable to discharge at this time due to chest pain and hypotensive episodes that require further work-up and cardiology consult pending further recommendation.   Consults called: Cardiology  Admission status: Observation    Bernadette Hoit MD [facility]  If 7PM-7AM, please contact night-coverage [email or web address] [facility]  02/29/2020, 2:14 AM

## 2020-02-29 NOTE — Progress Notes (Addendum)
Discussed with admitting ED physician. Patient currently CP free and reportedly had short duration chest pain yesterday. Per chart review appears also had SOB and near syncope along with symptomatic hypotension. Troponin with moderate elevation and delta. Has significant prior CAD history. May ultimately require repeat coronary evaluation however currently CP free and BP stable, otherwise asx. Cardiology to evaluate 09/12 and determine if need for transfer for coronary evaluation vs staying at [facility] and tx medically.   Would recommend holding eliquis and transition to heparin gtt pending further eval and decision on whether he needs coronary evaluation.

## 2020-02-29 NOTE — Progress Notes (Signed)
ANTICOAGULATION CONSULT NOTE - Initial Consult  Pharmacy Consult for Heparin Indication: atrial fibrillation  Allergies  Allergen Reactions  . Benazepril Other (See Comments)    hyperkalemia  . Fish Allergy Itching  . Omega-3 Fatty Acids Hives and Itching    Patient Measurements: Height: 6' (182.9 cm) Weight: 99.9 kg (220 lb 3.8 oz) IBW/kg (Calculated) : 77.6 Heparin Dosing Weight: 95 kg  Vital Signs: Temp: 97.9 F (36.6 C) (09/12 1535) Temp Source: Oral (09/12 1535) BP: 128/80 (09/12 1535) Pulse Rate: 64 (09/12 1535)  Labs: Recent Labs    02/28/20 1910 02/28/20 2049 02/29/20 0626 02/29/20 1545  HGB 17.7*  --   --   --   HCT 55.4*  --   --   --   PLT 182  --   --   --   APTT 30  --  31 77*  LABPROT 13.7  --  15.3*  --   INR 1.1  --  1.3*  --   HEPARINUNFRC  --   --  >2.20*  --   CREATININE 1.15  --   --   --   TROPONINIHS 38* 72*  --   --     Estimated Creatinine Clearance: 72.1 mL/min (by C-G formula based on SCr of 1.15 mg/dL).   Medical History: Past Medical History:  Diagnosis Date  . Atrial flutter ([place])    s/p cardioversion  . Coronary artery disease   . Diabetes mellitus   . GERD (gastroesophageal reflux disease)   . History of nuclear stress test 04/04/2011   lexiscan; mod-large in size fixed inferolateral defect (scar); non-diagnostic for ischemia; low risk scan   . Hyperlipidemia   . Hypertension   . Left foot drop    r/t past disk srugery - uses Kevlar brace  . Myocardial infarction ([place])    posterior MI  . Shortness of breath   . Sleep apnea    on CPAP; 04/28/2007 split-night - AHI during total sleep 44.43/hr and REM 72.56/hr    Medications:    Assessment: 69 y.o. male admitted with chest pain, h/o Afib and Eliquis on hold, for heparin.  Last dose of Eliquis AM 9/11  Goal of Therapy:  APTT 66-102 sec Heparin level 0.3-0.7 units/ml Monitor platelets by anticoagulation protocol: Yes   02/29/20 1700 update aPTT: 77 seconds, within  goal therapeutic range on heparin at 1450 units/hr CBC: Hb 17.7     Plates 182 RN reports no bleeding complications or issues with infusion site   Plan:  Continue heparin infusion rate at 1450 units/hr Re-check aPTT in 8 hours and until it correlates with heparin level Daily CBC and heparin level Monitor for signs/symptoms of bleeding.  Despina Pole 02/29/2020,5:02 PM

## 2020-02-29 NOTE — Progress Notes (Signed)
Briefly seen and examined this am.  Plz see H&P by night MD early this am for details. No recurrence of chest pain.  Pending cardio eval. May need cardiac cath. On heparin drip Continue aspirin, statin and bblocker. Resume levemir at a lower dose of 30 units at bedtime.  SSI.

## 2020-02-29 NOTE — Progress Notes (Signed)
ANTICOAGULATION CONSULT NOTE - Initial Consult  Pharmacy Consult for Heparin Indication: atrial fibrillation  Allergies  Allergen Reactions  . Benazepril Other (See Comments)    hyperkalemia  . Fish Allergy Itching  . Omega-3 Fatty Acids Hives and Itching    Patient Measurements: Height: 6' (182.9 cm) Weight: 100.7 kg (222 lb) IBW/kg (Calculated) : 77.6 Heparin Dosing Weight: 95 kg  Vital Signs: Temp: 98.4 F (36.9 C) (09/11 1905) Temp Source: Oral (09/11 1905) BP: 112/69 (09/12 0430) Pulse Rate: 54 (09/12 0430)  Labs: Recent Labs    02/28/20 1910 02/28/20 2049  HGB 17.7*  --   HCT 55.4*  --   PLT 182  --   APTT 30  --   LABPROT 13.7  --   INR 1.1  --   CREATININE 1.15  --   TROPONINIHS 38* 72*    Estimated Creatinine Clearance: 72.3 mL/min (by C-G formula based on SCr of 1.15 mg/dL).   Medical History: Past Medical History:  Diagnosis Date  . Atrial flutter ([place])    s/p cardioversion  . Coronary artery disease   . Diabetes mellitus   . GERD (gastroesophageal reflux disease)   . History of nuclear stress test 04/04/2011   lexiscan; mod-large in size fixed inferolateral defect (scar); non-diagnostic for ischemia; low risk scan   . Hyperlipidemia   . Hypertension   . Left foot drop    r/t past disk srugery - uses Kevlar brace  . Myocardial infarction ([place])    posterior MI  . Shortness of breath   . Sleep apnea    on CPAP; 04/28/2007 split-night - AHI during total sleep 44.43/hr and REM 72.56/hr    Medications:  No current facility-administered medications on file prior to encounter.   Current Outpatient Medications on File Prior to Encounter  Medication Sig Dispense Refill  . albuterol (PROVENTIL HFA;VENTOLIN HFA) 108 (90 Base) MCG/ACT inhaler Inhale 2 puffs into the lungs every 6 (six) hours as needed for wheezing or shortness of breath. 1 Inhaler 6  . aspirin 81 MG tablet Take 81 mg by mouth at bedtime.     [name] ELIQUIS 5 MG TABS tablet TAKE 1  TABLET BY MOUTH TWICE A DAY (Patient taking differently: Take 5 mg by mouth 2 (two) times daily. ) 180 tablet 1  . ENTRESTO 49-51 MG TAKE 1 TABLET BY MOUTH TWICE A DAY (Patient taking differently: Take 1 tablet by mouth in the morning and at bedtime. ) 60 tablet 5  . Fluticasone Furoate (ARNUITY ELLIPTA) 200 MCG/ACT AEPB INHALE 1 PUFF BY MOUTH EVERY DAY (Patient taking differently: Inhale 1 puff into the lungs daily. ) 30 each 5  . insulin aspart (NOVOLOG FLEXPEN) 100 UNIT/ML FlexPen 5-20 units with lunch and dinner (Patient taking differently: Inject 5-6 Units into the skin 3 (three) times daily as needed for high blood sugar. ) 15 mL 1  . Insulin Detemir (LEVEMIR FLEXTOUCH) 100 UNIT/ML Pen INJECT 46 UNITS INTO THE SKIN DAILY. Dx:E11.9 (Patient taking differently: Inject 46 Units into the skin at bedtime. ) 30 mL 3  . JARDIANCE 25 MG TABS tablet TAKE 1 TABLET BY MOUTH EVERY DAY (Patient taking differently: Take 25 mg by mouth at bedtime. ) 30 tablet 5  . montelukast (SINGULAIR) 10 MG tablet TAKE 1 TABLET BY MOUTH EVERY DAY (Patient taking differently: Take 10 mg by mouth in the morning. ) 90 tablet 3  . nebivolol (BYSTOLIC) 2.5 MG tablet Take 1 tablet (2.5 mg total) by mouth daily. [place]  tablet 3  . nitroGLYCERIN (NITROSTAT) 0.4 MG SL tablet PLACE 1 TABLET (0.4 MG TOTAL) UNDER THE TONGUE EVERY 5 (FIVE) MINUTES AS NEEDED FOR CHEST PAIN. 25 tablet 1  . rosuvastatin (CRESTOR) 20 MG tablet Take 1 tablet (20 mg total) by mouth daily. (Patient taking differently: Take 20 mg by mouth every evening. ) 90 tablet 3  . Insulin Pen Needle (NOVOFINE) 32G X 6 MM MISC 1 each by Other route daily. (Patient not taking: Reported on 02/28/2020) 100 each 3  . ONETOUCH ULTRA test strip USE TO CHECK BLOOD SUGAR 3 TIMES A DAY (E11.9) (Patient not taking: Reported on 02/28/2020) 100 strip 2     Assessment: 69 y.o. male admitted with chest pain, h/o Afib and Eliquis on hold, for heparin.  Last dose of Eliquis AM 9/11  Goal of  Therapy:  APTT 66-102 sec Heparin level 0.3-0.7 units/ml Monitor platelets by anticoagulation protocol: Yes   Plan:  Start heparin 1450 units/hr APTT in 8 hours  Caryl Pina 02/29/2020,6:14 AM

## 2020-02-29 NOTE — ED Notes (Signed)
Patient denies pain and is resting comfortably.  

## 2020-03-01 ENCOUNTER — Inpatient Hospital Stay (HOSPITAL_COMMUNITY): Payer: Medicare Other

## 2020-03-01 DIAGNOSIS — I214 Non-ST elevation (NSTEMI) myocardial infarction: Secondary | ICD-10-CM

## 2020-03-01 DIAGNOSIS — R079 Chest pain, unspecified: Secondary | ICD-10-CM

## 2020-03-01 DIAGNOSIS — E119 Type 2 diabetes mellitus without complications: Secondary | ICD-10-CM

## 2020-03-01 LAB — CBC
HCT: 49.2 % (ref 39.0–52.0)
Hemoglobin: 15.4 g/dL (ref 13.0–17.0)
MCH: 29.6 pg (ref 26.0–34.0)
MCHC: 31.3 g/dL (ref 30.0–36.0)
MCV: 94.6 fL (ref 80.0–100.0)
Platelets: 148 10*3/uL — ABNORMAL LOW (ref 150–400)
RBC: 5.2 MIL/uL (ref 4.22–5.81)
RDW: 13.7 % (ref 11.5–15.5)
WBC: 6.8 10*3/uL (ref 4.0–10.5)
nRBC: 0 % (ref 0.0–0.2)

## 2020-03-01 LAB — ECHOCARDIOGRAM COMPLETE
AR max vel: 3.64 cm2
AV Area VTI: 3.26 cm2
AV Area mean vel: 3.51 cm2
AV Mean grad: 2.9 mmHg
AV Peak grad: 5.3 mmHg
Ao pk vel: 1.15 m/s
Area-P 1/2: 3.11 cm2
Calc EF: 38.6 %
Height: 72 in
S' Lateral: 4.99 cm
Single Plane A2C EF: 35.9 %
Single Plane A4C EF: 41.1 %
Weight: 3523.83 oz

## 2020-03-01 LAB — APTT
aPTT: 133 seconds — ABNORMAL HIGH (ref 24–36)
aPTT: 80 seconds — ABNORMAL HIGH (ref 24–36)
aPTT: 57 seconds — ABNORMAL HIGH (ref 24–36)

## 2020-03-01 LAB — GLUCOSE, CAPILLARY
Glucose-Capillary: 98 mg/dL (ref 70–99)
Glucose-Capillary: 154 mg/dL — ABNORMAL HIGH (ref 70–99)
Glucose-Capillary: 83 mg/dL (ref 70–99)
Glucose-Capillary: 133 mg/dL — ABNORMAL HIGH (ref 70–99)

## 2020-03-01 LAB — HEPARIN LEVEL (UNFRACTIONATED): Heparin Unfractionated: 1.07 IU/mL — ABNORMAL HIGH (ref 0.30–0.70)

## 2020-03-01 LAB — TROPONIN I (HIGH SENSITIVITY): Troponin I (High Sensitivity): 115 ng/L (ref <18)

## 2020-03-01 MED ORDER — INSULIN DETEMIR 100 UNIT/ML ~~LOC~~ SOLN
15.0000 [IU] | Freq: Every day | SUBCUTANEOUS | Status: DC
  Administered 2020-03-01: 15 [IU] via SUBCUTANEOUS
  Filled 2020-03-01 (×4): qty 0.15

## 2020-03-01 MED ORDER — ASPIRIN 81 MG PO CHEW
81.0000 mg | CHEWABLE_TABLET | ORAL | Status: AC
  Administered 2020-03-02: 81 mg via ORAL
  Filled 2020-03-01: qty 1

## 2020-03-01 MED ORDER — SODIUM CHLORIDE 0.9 % WEIGHT BASED INFUSION: 1.0000 mL/kg/h | INTRAVENOUS | Status: DC

## 2020-03-01 MED ORDER — SODIUM CHLORIDE 0.9% FLUSH: 3.0000 mL | INTRAVENOUS | Status: DC | PRN

## 2020-03-01 MED ORDER — SODIUM CHLORIDE 0.9% FLUSH
3.0000 mL | Freq: Two times a day (BID) | INTRAVENOUS | Status: DC
  Administered 2020-03-01: 3 mL via INTRAVENOUS

## 2020-03-01 MED ORDER — SODIUM CHLORIDE 0.9 % IV SOLN: 250.0000 mL | INTRAVENOUS | Status: DC | PRN

## 2020-03-01 MED ORDER — SODIUM CHLORIDE 0.9 % WEIGHT BASED INFUSION
3.0000 mL/kg/h | INTRAVENOUS | Status: DC
  Administered 2020-03-02: 3 mL/kg/h via INTRAVENOUS

## 2020-03-01 NOTE — Progress Notes (Signed)
ANTICOAGULATION CONSULT NOTE - Initial Consult  Pharmacy Consult for Heparin Indication: atrial fibrillation  Allergies  Allergen Reactions  . Benazepril Other (See Comments)    hyperkalemia  . Fish Allergy Itching  . Omega-3 Fatty Acids Hives and Itching    Patient Measurements: Height: 6' (182.9 cm) Weight: 99.9 kg (220 lb 3.8 oz) IBW/kg (Calculated) : 77.6 Heparin Dosing Weight: 95 kg  Vital Signs: Temp: 97.6 F (36.4 C) (09/13 0419) Temp Source: Oral (09/13 0419) BP: 132/88 (09/13 0951) Pulse Rate: 60 (09/13 0419)  Labs: Recent Labs    02/28/20 1910 02/28/20 1910 02/28/20 2049 02/29/20 0626 02/29/20 0626 02/29/20 1545 03/01/20 0307 03/01/20 0308 03/01/20 0849 03/01/20 1126  HGB 17.7*  --   --   --   --   --  15.4  --   --   --   HCT 55.4*  --   --   --   --   --  49.2  --   --   --   PLT 182  --   --   --   --   --  148*  --   --   --   APTT 30   < >  --  31   < > 77* 133*  --   --  80*  LABPROT 13.7  --   --  15.3*  --   --   --   --   --   --   INR 1.1  --   --  1.3*  --   --   --   --   --   --   HEPARINUNFRC  --   --   --  >2.20*  --   --   --  1.07*  --   --   CREATININE 1.15  --   --   --   --   --   --   --   --   --   TROPONINIHS 38*  --  72*  --   --   --   --   --  115*  --    < > = values in this interval not displayed.    Estimated Creatinine Clearance: 72.1 mL/min (by C-G formula based on SCr of 1.15 mg/dL).    Assessment: 69 y.o. male admitted with chest pain, h/o Afib and Eliquis on hold, for heparin.  Last dose of Eliquis AM 9/11  Goal of Therapy:  APTT 66-102 sec Heparin level 0.3-0.7 units/ml Monitor platelets by anticoagulation protocol: Yes   03/01/20 1245 update aPTT: 80 seconds, within goal therapeutic range on heparin at 1200 units/hr CBC: Hb 15.4     Plates 182>148  Heparin Level: 1.07 (still not correlating) RN reports no bleeding complications or issues with infusion site   Plan:  Continue heparin infusion rate at 1200  units/hr Re-check aPTT ~1800 tonight Daily CBC, aPTT and heparin level  Monitor for signs/symptoms of bleeding.  Despina Pole 03/01/2020,12:42 PM

## 2020-03-01 NOTE — Progress Notes (Addendum)
Dr. Harl Bowie recommends sending patient to [facility] for cath and cardiology will assume on our service. He recommends to continue ASA at present dose along with heparin, continue to hold Eliquis. He discussed procedure with patient. Will adjust insulin dose to 15 units nightly for now pending cath tomorrow, can be uptitrated back when all procedures complete. He is tentatively scheduled for cath with Dr. Ellyn Hack at 10:30am tomorrow. Notified IM of plans as well. If there are no beds available today, the cath lab will likely plan to transfer him over to [facility] in preparation for cath tomorrow AM. I did discuss aspirin dosing with pre-procedural coordinator since he takes this QHS - they typically give it the morning of cath as well so that it does not get missed. It can then be adjusted for daily if needed thereafter. Needs to be kept NPO for solid foods after midnight, may have clear liquids until 5am, then NPO thereafter. Orders written. [facility] will assist with arranging [facility].  Meline Russaw PA-C

## 2020-03-01 NOTE — Progress Notes (Signed)
ANTICOAGULATION CONSULT NOTE   Pharmacy Consult for Heparin Indication: atrial fibrillation  Allergies  Allergen Reactions   Benazepril Other (See Comments)    hyperkalemia   Fish Allergy Itching   Omega-3 Fatty Acids Hives and Itching    Patient Measurements: Height: 6' (182.9 cm) Weight: 99.9 kg (220 lb 3.8 oz) IBW/kg (Calculated) : 77.6 Heparin Dosing Weight: 95 kg  Vital Signs: Temp: 97.6 F (36.4 C) (09/13 0419) Temp Source: Oral (09/13 0419) BP: 116/83 (09/13 0419) Pulse Rate: 60 (09/13 0419)  Labs: Recent Labs    02/28/20 1910 02/28/20 1910 02/28/20 2049 02/29/20 0626 02/29/20 1545 03/01/20 0307 03/01/20 0308  HGB 17.7*  --   --   --   --  15.4  --   HCT 55.4*  --   --   --   --  49.2  --   PLT 182  --   --   --   --  148*  --   APTT 30   < >  --  31 77* 133*  --   LABPROT 13.7  --   --  15.3*  --   --   --   INR 1.1  --   --  1.3*  --   --   --   HEPARINUNFRC  --   --   --  >2.20*  --   --  1.07*  CREATININE 1.15  --   --   --   --   --   --   TROPONINIHS 38*  --  72*  --   --   --   --    < > = values in this interval not displayed.    Estimated Creatinine Clearance: 72.1 mL/min (by C-G formula based on SCr of 1.15 mg/dL).   Medical History: Past Medical History:  Diagnosis Date   Atrial flutter ([place])    s/p cardioversion   Coronary artery disease    Diabetes mellitus    GERD (gastroesophageal reflux disease)    History of nuclear stress test 04/04/2011   lexiscan; mod-large in size fixed inferolateral defect (scar); non-diagnostic for ischemia; low risk scan    Hyperlipidemia    Hypertension    Left foot drop    r/t past disk srugery - uses Kevlar brace   Myocardial infarction ([facility])    posterior MI   Shortness of breath    Sleep apnea    on CPAP; 04/28/2007 split-night - AHI during total sleep 44.43/hr and REM 72.56/hr    Medications:    Assessment: 69 y.o. male admitted with chest pain, h/o Afib and Eliquis on hold,  for heparin.  Last dose of Eliquis AM 9/11 -aPTT= 133  Goal of Therapy:  APTT 66-102 sec Heparin level 0.3-0.7 units/ml Monitor platelets by anticoagulation protocol: Yes     Plan:  -Decrease heparin to 1200 units/hr -Heparin level in 6 hours and daily wth CBC daily  Hildred Laser, PharmD Clinical Pharmacist **Pharmacist phone directory can now be found on [email or web address] ([email or web address]).  Listed under [place].

## 2020-03-01 NOTE — Progress Notes (Signed)
PROGRESS NOTE  John [name]  DOB: 07-Aug-1950  PCP: [name], MD LOV:[id number]  DOA: 02/28/2020  LOS: 1 day   Chief Complaint  Patient presents with  . Chest Pain    Brief narrative: John [name] is a 69 y.o. male with medical history significant for hypertension, hyperlipidemia, CAD s/p stent placement, GERD, type II DM. Patient presented to the ED on 9/11 with complaint of left-sided pressure-like, nonreproducible chest pain with radiation to the neck and with sensation of near fainting which occurred while he was washing his car few hours prior to arrival to the ED.  This was associated with cold sweats. Patient also complained of pain in between the shoulder.  Patient sat down on his recliner and on checking his blood pressure he noted that BP was 71/60 and a repeat BP was 80/70.  Chest pain was rated as 5/10 on pain scale, he took nitroglycerin and chest pain resolved about 7 minutes after.  Patient states that he had similar episode while mowing his lawn last week whereby he noted that his BP dropped as well.  He denies vomiting, abdominal pain, fever, chills.  In the emergency department, BP was 97/70 on arrival and other vital signs are within normal range.  Work-up in the ED showed WBC 10.8, H/H 17.7/54.4, blood glucose was 168.  Troponin x2 38>72.  SARS coronavirus 2 was negative.  CT angiography chest, abdomen and pelvis showed no evidence of acute aortic syndrome and no acute intrathoracic or intra-abdominal process was noted.  Chest x-ray showed cardiac enlargement with no evidence of active pulmonary disease.  IV hydration was provided in the ED.  ED physician contacted [facility] cardiology fellow who recommended patient to be admitted here with continued trending of troponin and with plan for cardiology to see patient in the morning.  Hospitalist was asked to admit.  For further evaluation and management.  Subjective: Patient was seen and examined this  morning. Not in distress.  No new symptoms.  Seen by cardiology.  Noted plan to transfer to [facility] for cardiac cath  Assessment/Plan: Chest pain Coronary disease status post stents -Presented with chest pain.  Troponin elevated.  EKG with T wave inversion. -Cardiology evaluation obtained.  Noted plan to transfer to [facility] for cardiac cath. -Currently on heparin drip.  Continue aspirin, statin, beta-blocker. -Sublingual nitroglycerin for pain.  Hypotensive episode -Patient reported hypotensive episode in which BP dropped to 71/60 with associated symptoms as described in HPI.  He had similar episode about a week ago. -A brief episode of hypotension may have precipitated demand ischemia and chest pain.  Hyperglycemia secondary to type 2 diabetes mellitus On insulin regimen.  Noted plan from cardiology to give less insulin tonight in preparation of tomorrow  Essential hypertension On beta-blocker  GERD Continue Protonix  Hyperlipidemia Continue home statin  COPD (not in exacerbation) Continue Ventolin, fluticasone  CAD s/p multiple stent placement Continue statin, aspirin  Thoracic aortic aneurysm CT angiography of chest showed aneurysmal dilatation of the ascending thoracic aorta up to 4.1 cm, and on imaging follow-up by CTA or MRA recommended  Obesity (BMI 30.1) Patient was counseled for diet control, exercise regimen and weight loss.   Mobility: Encourage ambulation Code Status:   Code Status: Full Code  Nutritional status: Body mass index is 29.87 kg/m.     Diet Order            Diet NPO time specified Except for: Sips with Meds  Diet  effective midnight           Diet heart healthy/carb modified Room service appropriate? Yes; Fluid consistency: Thin  Diet effective now                 DVT prophylaxis: SCDs Start: 02/29/20 0101   Antimicrobials:  None Fluid: None Consultants: Cardiology Family Communication:  None at bedside  Status is:  Inpatient  Remains inpatient appropriate because -patient to be transferred to [facility] under cardiology service.  Dispo: The patient is from: Home              Anticipated d/c is to: To be transferred to [facility]              Anticipated d/c date is: When bed available      Infusions:  . [START ON 03/02/2020] sodium chloride     Followed by  . [START ON 03/02/2020] sodium chloride    . heparin 1,200 Units/hr (03/01/20 0509)    Scheduled Meds: . [START ON 03/02/2020] aspirin  81 mg Oral Pre-Cath  . aspirin EC  81 mg Oral QHS  . budesonide (PULMICORT) nebulizer solution  0.5 mg Nebulization BID  . insulin aspart  0-15 Units Subcutaneous TID WC  . insulin aspart  0-5 Units Subcutaneous QHS  . insulin detemir  15 Units Subcutaneous QHS  . montelukast  10 mg Oral q AM  . nebivolol  2.5 mg Oral Daily  . rosuvastatin  20 mg Oral QPM  . sodium chloride flush  3 mL Intravenous Q12H    Antimicrobials: Anti-infectives (From admission, onward)   None      PRN meds: acetaminophen, albuterol, nitroGLYCERIN, ondansetron (ZOFRAN) IV   Objective: Vitals:   03/01/20 0951 03/01/20 1351  BP: 132/88 122/77  Pulse:  62  Resp:  17  Temp:  98.5 F (36.9 C)  SpO2:  93%    Intake/Output Summary (Last 24 hours) at 03/01/2020 1402 Last data filed at 03/01/2020 1300 Gross per 24 hour  Intake 553.06 ml  Output 550 ml  Net 3.06 ml   Filed Weights   02/28/20 1859 02/29/20 1535  Weight: 100.7 kg 99.9 kg   Weight change: -0.799 kg Body mass index is 29.87 kg/m.   Physical Exam: General exam: Appears calm and comfortable.  Not in distress Skin: No rashes, lesions or ulcers. HEENT: Atraumatic, normocephalic, supple neck, no obvious bleeding Lungs: Clear to auscultation bilaterally CVS: Regular rate and rhythm, no murmur GI/Abd soft, nontender, nondistended, bowel sound present CNS: Alert, awake oriented x3 Psychiatry: Mood appropriate Extremities: No pedal edema, no calf tenderness  Data  Review: I have personally reviewed the laboratory data and studies available.  Recent Labs  Lab 02/28/20 1910 03/01/20 0307  WBC 10.8* 6.8  NEUTROABS 8.2*  --   HGB 17.7* 15.4  HCT 55.4* 49.2  MCV 93.9 94.6  PLT 182 148*   Recent Labs  Lab 02/28/20 1910  NA 143  K 4.1  CL 107  CO2 25  GLUCOSE 168*  BUN 23  CREATININE 1.15  CALCIUM 9.6    Signed, Terrilee Croak, MD [facility] 03/01/2020

## 2020-03-01 NOTE — Progress Notes (Signed)
*  PRELIMINARY RESULTS* Echocardiogram 2D Echocardiogram has been performed.  John [name] 03/01/2020, 12:48 PM

## 2020-03-01 NOTE — Consult Note (Addendum)
Cardiology Consultation:   Patient ID: John [name] MRN: [id number]; DOB: Apr 04, 1951  Admit date: 02/28/2020 Date of Consult: 03/01/2020  Primary Care Provider: [name], MD [facility] Cardiologist: Shelva Majestic, MD  [place] Electrophysiologist:  None   Patient Profile:   John [name] is a 69 y.o. male with a hx of CAD (multiple prior PCIs reported in the RCA and Cx, last stenting in RCA 2010), ischemic cardiomyopathy, HTN, HLD, DM, OSA, TAA, interstitial lung disease and pulmonary nodule followed by pulmonology, and paroxysmal atrial flutter s/p prior cardioversion who is being seen today for the evaluation of chest pain/hypotension at the request of Dr. Josephine Cables.  History of Present Illness:   He is followed by Dr. Claiborne Billings with hx as outlined above with numerous prior caths/PCIs - last was under Op notes in 2013 showing no evidence of ISR, + "full metal jacket" in RCA, 40-50% smooth narrowing distally in RCA, LVEF 35-40%. Nuc in 04/2017 showed LVEF 34%, findings c/w prior MI, intermediate risk study; Dr. Claiborne Billings interpreted report as no ischemia. More recent LVEF was 50-55% by echo in 08/2018.  He has remained fairly active and done well since last PCI until last week. Last week he was working outside and got very woozy/lightheaded so checked his BP which was in the 17C systolic. He went in and rested and felt better the remainder of the week. He was unsure why this would have happened because he tries to stay well hydrated regularly. However, 2 days ago, on Saturday, he was outside washing his car and felt the same lightheaded sensation. He went inside and checked BP which was 85/60 by his home cuff. He rechecked on opposite arm and it was similar. He drank water and rested for several hours but hypotension persisted. He then began to develop mild left sided chest pain that radiated up to his neck, which he states was his usual CP in the past. He took a SL NTG and it  eased the pain, which concerned him. He continued to follow his BP at home and when it reached in to the 94W systolic he decided to come to the ED. .Initial ED BP 103/71. He was given IV fluids in the ED. He is not tachycardic, tachypneic or hypoxic. hsTroponin 38->72, mild leukocytosis of 10.8 and elevated Hgb 17.7, A1C 7.7. CXR with cardiac enlargement, no acute disease. CTA showing 4.1cm dilation of ascending TAA, aortic atherosclerosis, moderate-severe plaque narrowing at the IMA origin and mild narrowing at the celiac, SMA and right renal artery origins, other incidental findings as below with clustered pulmonary nodules (favored benign post infectious or inflammatory), prostatomegaly. He has remained pain free.  Last dose of Eliquis was 02/29/20 at [place]. He has been continued on home bystolic and ASA. Entresto 49/51mg  BID has been held.   Past Medical History:  Diagnosis Date  . Atrial flutter ([place])    s/p cardioversion  . Coronary artery disease   . Diabetes mellitus   . GERD (gastroesophageal reflux disease)   . History of nuclear stress test 04/04/2011   lexiscan; mod-large in size fixed inferolateral defect (scar); non-diagnostic for ischemia; low risk scan   . Hyperlipidemia   . Hypertension   . Left foot drop    r/t past disk srugery - uses Kevlar brace  . Myocardial infarction ([place])    posterior MI  . Shortness of breath   . Sleep apnea    on CPAP; 04/28/2007 split-night - AHI during total sleep  44.43/hr and REM 72.56/hr    Past Surgical History:  Procedure Laterality Date  . BACK SURGERY  1985  . CARDIAC CATHETERIZATION  2010   6 stents total  . CARDIAC CATHETERIZATION  01/2000   percutaneous transluminal coronary balloon angioplasty of mid RCA stenotic lesion  . CARDIAC CATHETERIZATION  06/2006   no stenting; ischemic cardiomyopathy, EF 40-45%  . CARDIOVERSION N/A 07/28/2016   Procedure: CARDIOVERSION;  Surgeon: Troy Sine, MD;  Location: [place];  Service:  Cardiovascular;  Laterality: N/A;  . CORONARY ANGIOPLASTY  09/1998   mid-distal RCA balloon dilatation, 4.5 & 5.0 stents   . CORONARY ANGIOPLASTY WITH STENT PLACEMENT  03/1994   angioplasty & stenting (non-DES) of circumflex/prox ramus intermedius  . CORONARY ANGIOPLASTY WITH STENT PLACEMENT  10/1994   large iliac [id number] stent to RCA  . CORONARY ANGIOPLASTY WITH STENT PLACEMENT  12/2002   4.53mm stents x2 of RCA  . CORONARY ANGIOPLASTY WITH STENT PLACEMENT  01/2005   cutting balloon arthrectomy of distal RCA & Cypher DES 3.5x13; cutting balloon arthrectomy of mid RCA with Cypher DES 3.5x18  . CORONARY ANGIOPLASTY WITH STENT PLACEMENT  11/2008   stenting of mid RCA with 4.0x35mm driver, non-DES  . LEFT HEART CATHETERIZATION WITH CORONARY ANGIOGRAM N/A 02/27/2012   Procedure: LEFT HEART CATHETERIZATION WITH CORONARY ANGIOGRAM;  Surgeon: Lorretta Harp, MD;  Location: [facility] CATH LAB;  Service: Cardiovascular;  Laterality: N/A;  . TRANSTHORACIC ECHOCARDIOGRAM  07/29/2010   EF 50=55%, mod inf wall hypokinesis & mild post wall hypokinesis; LA mild-mod dilated; mild mitral annular calcif & mild MR; mild TR & elevated RV systolic pressure; AV mildly sclerotic; mild aortic root dilatation      Home Medications:  Prior to Admission medications   Medication Sig Start Date End Date Taking? Authorizing Provider  albuterol (PROVENTIL HFA;VENTOLIN HFA) 108 (90 Base) MCG/ACT inhaler Inhale 2 puffs into the lungs every 6 (six) hours as needed for wheezing or shortness of breath. 05/30/18  Yes Delsa Grana, PA-C  aspirin 81 MG tablet Take 81 mg by mouth at bedtime.    Yes [provider]  ELIQUIS 5 MG TABS tablet TAKE 1 TABLET BY MOUTH TWICE A DAY Patient taking differently: Take 5 mg by mouth 2 (two) times daily.  10/13/19  Yes Troy Sine, MD  ENTRESTO 49-51 MG TAKE 1 TABLET BY MOUTH TWICE A DAY Patient taking differently: Take 1 tablet by mouth in the morning and at bedtime.  12/11/19  Yes [name], MD  Fluticasone Furoate (ARNUITY ELLIPTA) 200 MCG/ACT AEPB INHALE 1 PUFF BY MOUTH EVERY DAY Patient taking differently: Inhale 1 puff into the lungs daily.  01/30/20  Yes Icard, Bradley L, DO  insulin aspart (NOVOLOG FLEXPEN) 100 UNIT/ML FlexPen 5-20 units with lunch and dinner Patient taking differently: Inject 5-6 Units into the skin 3 (three) times daily as needed for high blood sugar.  09/16/19  Yes [name], MD  Insulin Detemir (LEVEMIR FLEXTOUCH) 100 UNIT/ML Pen INJECT 46 UNITS INTO THE SKIN DAILY. Dx:E11.9 Patient taking differently: Inject 46 Units into the skin at bedtime.  08/15/19  Yes PickardCammie Mcgee, MD  JARDIANCE 25 MG TABS tablet TAKE 1 TABLET BY MOUTH EVERY DAY Patient taking differently: Take 25 mg by mouth at bedtime.  12/11/19  Yes [name], MD  montelukast (SINGULAIR) 10 MG tablet TAKE 1 TABLET BY MOUTH EVERY DAY Patient taking differently: Take 10 mg by mouth in the morning.  09/23/19  Yes  [name], MD  nebivolol (BYSTOLIC) 2.5 MG tablet Take 1 tablet (2.5 mg total) by mouth daily. 10/01/19  Yes Troy Sine, MD  nitroGLYCERIN (NITROSTAT) 0.4 MG SL tablet PLACE 1 TABLET (0.4 MG TOTAL) UNDER THE TONGUE EVERY 5 (FIVE) MINUTES AS NEEDED FOR CHEST PAIN. 05/06/18  Yes Troy Sine, MD  rosuvastatin (CRESTOR) 20 MG tablet Take 1 tablet (20 mg total) by mouth daily. Patient taking differently: Take 20 mg by mouth every evening.  10/17/19  Yes Troy Sine, MD  Insulin Pen Needle (NOVOFINE) 32G X 6 MM MISC 1 each by Other route daily. Patient not taking: Reported on 02/28/2020 08/19/19   [name], MD  [facility] ULTRA test strip USE TO CHECK BLOOD SUGAR 3 TIMES A DAY (E11.9) Patient not taking: Reported on 02/28/2020 01/21/20   [name], MD    Inpatient Medications: Scheduled Meds: . aspirin EC  81 mg Oral QHS  . budesonide (PULMICORT) nebulizer solution  0.5 mg Nebulization BID  . insulin aspart  0-15 Units Subcutaneous TID WC    . insulin aspart  0-5 Units Subcutaneous QHS  . insulin detemir  30 Units Subcutaneous QHS  . montelukast  10 mg Oral q AM  . nebivolol  2.5 mg Oral Daily  . rosuvastatin  20 mg Oral QPM   Continuous Infusions: . heparin 1,200 Units/hr (03/01/20 0509)   PRN Meds: acetaminophen, albuterol, nitroGLYCERIN, ondansetron (ZOFRAN) IV  Allergies:    Allergies  Allergen Reactions  . Benazepril Other (See Comments)    hyperkalemia  . Fish Allergy Itching  . Omega-3 Fatty Acids Hives and Itching    Social History:   Social History   Socioeconomic History  . Marital status: Married    Spouse name: Not on file  . Number of children: 3  . Years of education: Not on file  . Highest education level: Not on file  Occupational History  . 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  Tobacco Use  . Smoking status: Former Smoker    Packs/day: 1.00    Years: 50.00    Pack years: 50.00    Types: Cigarettes    Quit date: 07/20/2016    Years since quitting: 3.6  . Smokeless tobacco: Never Used  Substance and Sexual Activity  . Alcohol use: Yes    Alcohol/week: 0.0 standard drinks    Comment: occasionally  . Drug use: No  . Sexual activity: Yes  Other Topics Concern  . Not on file  Social History Narrative  . Not on file   Social Determinants of Health   Financial Resource Strain:   . Difficulty of Paying Living Expenses: Not on file  Food Insecurity:   . Worried About Charity fundraiser in the Last Year: Not on file  . Ran Out of Food in the Last Year: Not on file  Transportation Needs:   . Lack of Transportation (Medical): Not on file  . Lack of Transportation (Non-Medical): Not on file  Physical Activity:   . Days of Exercise per Week: Not on file  . Minutes of Exercise per Session: Not on file  Stress:   . Feeling of Stress : Not on file  Social Connections:   . Frequency of Communication with Friends and Family: Not on file  .  Frequency of Social Gatherings with Friends and Family: Not on file  . Attends Religious Services: Not on file  .  Active Member of Clubs or Organizations: Not on file  . Attends Archivist Meetings: Not on file  . Marital Status: Not on file  Intimate Partner Violence:   . Fear of Current or Ex-Partner: Not on file  . Emotionally Abused: Not on file  . Physically Abused: Not on file  . Sexually Abused: Not on file    Family History:    Family History  Problem Relation Age of Onset  . Heart attack Father      ROS:  Please see the history of present illness. Also reports occasional leg cramping behind calves when walking. All other ROS reviewed and negative.     Physical Exam/Data:   Vitals:   02/29/20 1535 02/29/20 2016 02/29/20 2352 03/01/20 0419  BP: 128/80 (!) 152/78 121/83 116/83  Pulse: 64 (!) 58 (!) 55 60  Resp: 14 18 18 18   Temp: 97.9 F (36.6 C) 98.3 F (36.8 C) (!) 97.5 F (36.4 C) 97.6 F (36.4 C)  TempSrc: Oral Oral Oral Oral  SpO2: 100% 98% 100% 97%  Weight: 99.9 kg     Height: 6' (1.829 m)       Intake/Output Summary (Last 24 hours) at 03/01/2020 0851 Last data filed at 02/29/2020 1634 Gross per 24 hour  Intake 73.06 ml  Output --  Net 73.06 ml   Last 3 Weights 02/29/2020 02/28/2020 02/19/2020  Weight (lbs) 220 lb 3.8 oz 222 lb 219 lb 12.8 oz  Weight (kg) 99.9 kg 100.699 kg 99.701 kg     Body mass index is 29.87 kg/m.  General: Well developed, well nourished WM, in no acute distress. Head: Normocephalic, atraumatic, sclera non-icteric, no xanthomas, nares are without discharge. Neck: Negative for carotid bruits. JVP not elevated. Lungs: Clear bilaterally to auscultation without wheezes, rales, or rhonchi. Breathing is unlabored. Heart: RRR S1 S2 without murmurs, rubs, or gallops.  Abdomen: Soft, non-tender, non-distended with normoactive bowel sounds. No rebound/guarding. Extremities: No clubbing or cyanosis. No edema. Distal pedal pulses are  2+ and equal bilaterally. Neuro: Alert and oriented X 3. Moves all extremities spontaneously. Psych:  Responds to questions appropriately with a normal affect.   EKG:  The EKG was personally reviewed and demonstrates:  NSR 87bpm, potential LVH, prior inferior infarct with accentuation of prior ST changes inferiorly with TWI V5-V6, subtle ST depression I, avL  Telemetry:  Telemetry was personally reviewed and demonstrates:  NSR with PACs/PVCs and one sinus pause overnight of 2.5 sec during sleeping hours  Relevant CV Studies: [facility] 2013 (under OP note) [place]     History obtained from chart review. Mr. Garrels is a 69 year old married Caucasian male patient of Dr. Terance Ice with a history  of hypertension, hyperlipidemia, insulin-dependent diabetes and a long history of coronary artery disease.he had his first procedure done in 1995 and has had multiple procedures since with stents in his RCA. He was admitted yesterday with accelerated angina by Dr. Rollene Fare and ruled out for myocardial infarction. He was placed on IV heparin and nitroglycerin. He presents now for diagnostic coronary arteriography to define his anatomy and rule out an ischemic etiology   PROCEDURE DESCRIPTION:    The patient was brought to the second floor  [facility] cath lab in the postabsorptive state. He was  premedicated with Valium 5 mg by mouth. His right groin was prepped and shaved in usual sterile fashion. Xylocaine 1% was used for local anesthesia. A 5 French sheath was  inserted into the right common femoral artery using standard Seldinger technique. 5 French right and left Judkins diagnostic catheters Lopid 5 French pigtail catheter were used for selective coronary angiography and left ventriculography respectively. Visipaque dye was used for the entirety of the case. Retrograde aortic, left ventricular and pullback pressures were  recorded.  HEMODYNAMICS:    AO SYSTOLIC/AO DIASTOLIC: 852/77   LV SYSTOLIC/LV DIASTOLIC: 824/23  ANGIOGRAPHIC RESULTS:   1. Left main; normal  2. LAD; minor irregularities 3. Left circumflex; nondominant with majority of the circumflex circumflex circulation and a large ramus Creg Gilmer.  4. Right coronary artery; dominant with "full metal jacket" and no evidence of "in-stent restenosis". There was 40-50% smooth narrowing beyond the stented segment in the distal vessel. 5. Left ventriculography; RAO left ventriculogram was performed using  25 mL the ejection fraction was 35-40% Without wall motion abnormalities  IMPRESSION:no evidence of in-stent restenosis with minor irregularities and moderate decrease in LV function. The sheath was removed and pressure was held on the groin to achieve hemostasis. The patient left the Cath Lab in stable condition. He'll be gently hydrated and discharged later today. He'll followup with Dr.Richard August Albino MD, [facility] 02/27/2012 1:41 PM      2D echo 08/2018  1. The left ventricle has low normal systolic function, with an ejection  fraction of 50-55%. The cavity size was moderately dilated. Left  ventricular diastolic Doppler parameters are consistent with impaired  relaxation.  2. The right ventricle has normal systolic function. The cavity was  normal. There is no increase in right ventricular wall thickness.  3. The mitral valve is normal in structure.  4. The tricuspid valve is normal in structure.  5. The aortic valve is tricuspid Mild thickening of the aortic valve.  6. The pulmonic valve was normal in structure. Pulmonic valve  regurgitation is mild by color flow Doppler.  7. LVEF is approximately 50 to 55% with basal inferior/inferolateral  hypokinesis.      Laboratory Data:  High Sensitivity Troponin:   Recent Labs  Lab 02/28/20 1910 02/28/20 2049  TROPONINIHS 38* 72*     Chemistry Recent Labs    Lab 02/28/20 1910  NA 143  K 4.1  CL 107  CO2 25  GLUCOSE 168*  BUN 23  CREATININE 1.15  CALCIUM 9.6  GFRNONAA >60  GFRAA >60  ANIONGAP 11    Recent Labs  Lab 02/28/20 1910  PROT 6.8  ALBUMIN 4.1  AST 32  ALT 38  ALKPHOS 67  BILITOT 0.7   Hematology Recent Labs  Lab 02/28/20 1910 03/01/20 0307  WBC 10.8* 6.8  RBC 5.90* 5.20  HGB 17.7* 15.4  HCT 55.4* 49.2  MCV 93.9 94.6  MCH 30.0 29.6  MCHC 31.9 31.3  RDW 13.8 13.7  PLT 182 148*   BNPNo results for input(s): BNP, PROBNP in the last 168 hours.  DDimer No results for input(s): DDIMER in the last 168 hours.   Radiology/Studies:  DG Chest Port 1 View  Result Date: 02/28/2020 CLINICAL DATA:  Lightheadedness, nausea, back pain and shortness of breath starting today while washing a car. Previous cardiac history. EXAM: PORTABLE CHEST 1 VIEW COMPARISON:  CT chest 02/10/2020 FINDINGS: Cardiac enlargement. No vascular congestion, edema, or consolidation. No pleural effusions. No pneumothorax. Mild elevation of the left hemidiaphragm is unchanged. Mediastinal contours appear intact. IMPRESSION: Cardiac enlargement.  No evidence of active pulmonary disease. Electronically Signed   By: Lucienne Capers M.D.   On: 02/28/2020 19:59  CT Angio Chest/Abd/Pel for Dissection W and/or Wo Contrast  Result Date: 02/28/2020 CLINICAL DATA:  Chest pain while washing car, lightheadedness, nausea and back pain, hypotensive at home, extensive cardiac history with multiple stents EXAM: CT ANGIOGRAPHY CHEST, ABDOMEN AND PELVIS TECHNIQUE: Non-contrast CT of the chest was initially obtained. Multidetector CT imaging through the chest, abdomen and pelvis was performed using the standard protocol during bolus administration of intravenous contrast. Multiplanar reconstructed images and MIPs were obtained and reviewed to evaluate the vascular anatomy. CONTRAST:  172mL OMNIPAQUE IOHEXOL 350 MG/ML SOLN COMPARISON:  CT chest 07/29/2019 FINDINGS: CTA  CHEST FINDINGS Cardiovascular: Initial noncontrast CT imaging of the chest reveals a mildly dilated ascending thoracic aorta up to 4.1 cm with at most minimal atherosclerotic plaque. No hyperdense mural thickening or plaque displacement present to suggest intramural hematoma. Postcontrast administration there is satisfactory preferential opacification of the thoracic aorta. Redemonstration of the mildly dilated ascending thoracic aorta to 4.1 cm returning to a normal caliber at the level of the distal aortic arch of approximately 2.8 cm. No acute luminal abnormality of the imaged aorta. No periaortic stranding or hemorrhage. Normal 3 vessel branching of the aortic arch. Proximal great vessels are unremarkable and free of acute luminal abnormality. Central pulmonary arteries are normal caliber. No large central filling defects on this non tailored, suboptimally opacified examination of the pulmonary arteries. Normal heart size. No pericardial effusion. Three-vessel coronary artery calcifications are present with multiple coronary stents. No pericardial effusion. Mediastinum/Nodes: No mediastinal fluid or gas. Normal thyroid gland and thoracic inlet. No acute abnormality of the trachea or esophagus. No worrisome mediastinal, hilar or axillary adenopathy. Lungs/Pleura: There is a triangular/geometric juxtapleural nodule in the right middle lobe abutting the minor fissure measuring up to 6 mm in size (7/86). Additional clustered sub 3 mm solid nodules are seen in the superior lingula (7/61). No other concerning pulmonary nodules or masses. Mild dependent atelectatic changes are present. No consolidation, features of edema, pneumothorax, or effusion. Musculoskeletal: The osseous structures appear diffusely demineralized which may limit detection of small or nondisplaced fractures. No acute osseous abnormality or suspicious osseous lesion. Multilevel degenerative changes are present in the imaged portions of the spine.  Multilevel flowing anterior osteophytosis, compatible with features of diffuse idiopathic skeletal hyperostosis (DISH). Additional degenerative changes present in the bilateral shoulders. Review of the MIP images confirms the above findings. CTA ABDOMEN AND PELVIS FINDINGS VASCULAR Aorta: Calcified and noncalcified atheromatous plaque throughout the normal caliber abdominal aorta. No significant aneurysmal dilatation is seen. No acute luminal abnormality of the imaged aorta. No periaortic stranding or hemorrhage. Celiac: Mild ostial plaque narrowing. Otherwise normally opacified without other significant stenosis or occlusion. Typical branching pattern. No dissection, aneurysm or evidence of vasculitis. SMA: At most mild ostial plaque narrowing. Otherwise normally opacified without significant stenosis, occlusion or acute luminal abnormality or features of vasculitis. Renals: Single renal arteries bilaterally. Minimal plaque within the proximal right renal artery. No significant stenosis, occlusion, acute luminal abnormality or features of fibromuscular dysplasia or vasculitis. IMA: Moderate to severe ostial plaque narrowing at the IMA origin with the distal vessel normally opacified. No acute luminal abnormalities are evident. No aneurysm, ectasia or features of vasculitis. Inflow: Atheromatous plaque throughout the common, internal external iliac arteries. Minimal if any significant luminal narrowing. No acute luminal abnormalities. No features of vasculitis. Proximal outflow vessels including the common femoral, profundus femoral and superficial femoral arteries demonstrate some mild atheromatous plaque but are otherwise normally opacified and widely patent without acute luminal abnormality  or features of vasculitis. Veins: Major venous structures are unremarkable within the limitations of this arterial phase examination. Review of the MIP images confirms the above findings. NON-VASCULAR Hepatobiliary: No  worrisome focal liver lesions. Smooth liver surface contour. Normal hepatic attenuation. Normal gallbladder and biliary tree without visible calcified gallstone. Pancreas: Partial fatty replacement of the pancreas. No pancreatic ductal dilatation or surrounding inflammatory changes. Spleen: Small accessory splenule near the pancreatic tail. Heterogeneous enhancement of the splenic parenchyma is quite typical for arterial phase of imaging. No splenomegaly or concerning focal splenic lesion. Adrenals/Urinary Tract: Normal adrenal glands. Kidneys enhance symmetrically and uniformly. There is a 3 cm fluid attenuation cyst arising from the upper pole right kidney. No concerning renal mass. No urolithiasis, hydronephrosis or concerning renal lesions. Urinary bladder is unremarkable. Stomach/Bowel: Stomach and duodenum are unremarkable. No small bowel thickening or dilatation. Some fecalization of the distal small bowel contents is nonspecific but could reflect some slowed intestinal transit without evidence of mechanical obstruction. Normal appendix in the right lower quadrant. No colonic dilatation or wall thickening. Scattered colonic diverticula without focal inflammation to suggest diverticulitis. Lymphatic: No suspicious or enlarged lymph nodes in the included lymphatic chains. Reproductive: Prostatomegaly with few coarse eccentric calcifications of prostate. No concerning focal lesions. Other: No abdominopelvic free fluid or free gas. No bowel containing hernias. Fat bilateral inguinal hernias, left greater than right. Focal subcutaneous skin thickening along the anterior abdominal wall likely reflecting prior injectable use. Mild posterior body wall edema. Musculoskeletal: Asymmetric fatty atrophy of the left vastus musculature, nonspecific. The osseous structures appear diffusely demineralized which may limit detection of small or nondisplaced fractures. No acute osseous abnormality or suspicious osseous lesion.  Multilevel degenerative changes are present in the imaged portions of the spine. Near complete ankylosis of the bilateral SI joints as well as a transitional lumbosacral vertebrae. Stable mild-to-moderate degenerative changes in the bilateral hips. Review of the MIP images confirms the above findings. IMPRESSION: VASCULAR 1. No evidence of acute aortic syndrome. 2. Aneurysmal dilatation of the ascending thoracic aorta up to 4.1 cm. Recommend annual imaging followup by CTA or MRA. This recommendation follows 2010 ACCF/AHA/AATS/ACR/ASA/SCA/SCAI/SIR/STS/SVM Guidelines for the Diagnosis and Management of Patients with Thoracic Aortic Disease. Circulation. 2010; 121: M841-L244. Aortic aneurysm NOS (ICD10-I71.9) 3.  Aortic Atherosclerosis (ICD10-I70.0). 4. Moderate to severe plaque narrowing of the IMA origin. More mild plaque narrowing at the celiac, SMA and right renal artery origins. 5. Three-vessel coronary artery calcifications with multiple coronary stents. NONVASCULAR 1. No acute intrathoracic or intra-abdominal process. 2. Clustered appearance of several sub 5 mm solid nodules likely reflecting resolution of a larger more coalescent lesion previously seen in the lingula. Favoring a benign post infectious or inflammatory process. 3. Additional benign geometric nodule in the right middle lobe, favor intrapulmonary lymph node. 4. Scattered colonic diverticula without evidence of diverticulitis. 5. Some fecalization of the distal small bowel contents is nonspecific but could reflect some slowed intestinal transit without evidence of mechanical obstruction. 6. Prostatomegaly. Correlate for clinical features of outlet obstruction. Electronically Signed   By: Lovena Le M.D.   On: 02/28/2020 22:40       TIMI Risk Score for Unstable Angina or Non-ST Elevation MI:   The patient's TIMI risk score is 5, which indicates a 26% risk of all cause mortality, new or recurrent myocardial infarction or need for urgent  revascularization in the next 14 days.   New York Heart Association (NYHA) Functional Class NYHA Class II  Assessment and Plan:  1. Hypotension with associated chest pain in the context of known CAD s/p multiple prior PCIs - difficult to know what is chicken or the egg here - certainly could have had angina precipitated by hypotension although it's unclear why he now has developed hypotension at all - CTA negative for dissection - will order bilateral BPs here this AM to assess for any significant difference given patient's abdominal PAD on CTA - no subclavian bruits heard though) - recheck troponin this AM - continue ASA, BB (as BP tolerates), statin, heparin per pharmacy - obtain echocardiogram - will discuss plan with MD including whether we need to give additional aspirin dosing for ACS (has received 81mg  nightly since admission)  2. History of ischemic cardiomyopathy - hold Entresto 2/2 hypotension - recheck echocardiogram  3. Possible claudication - can update ABIs as OP (normal in 2018 with normal pedal pulses today)  4. Diabetes mellitus - primary team managing insulin, received 30 units of Levemir last night and is on SSI  - resume Jardiance when no further procedures needed  5. HLD - LDL 61 on Crestor this admission, continue statin  6. H/o atrial flutter - NSR with PACs/PVCs on telemetry - Eliquis on hold 2/2 potential need for cath  7. Ascending TAA - anticipate continued outpatient f/u  For questions or updates, please contact [place] Please consult [email or web address] for contact info under    Signed, Charlie Pitter, PA-C  03/01/2020 8:51 AM   Attending note Patient seen and discussed with PA Dunn, I agree with her documentation. 69 yo male history of CAD with extensive prior stenting as reported above, aflutter, OSA, HTN, hyperlipidemia admitted with chest pain and low bp's at home  Episode of chest pain1 week ago after riding his lawnmower. Went inside  to cool off, noticed bp's were low 90s/60s. Felt weak, some SOB. Pain left chest raditing to left left similar to his prior angina, improved with SL NG. Resolved within in a few minutes. Repeat episode 1 week later same symptoms, better with NG. Reports some generalized DOE over the last few weeks.   WBC 10.8 Hgb 17.7 Plt 182 K 4.1 Cr 1.15 BUN 23 LDL 61 COVID neg hstrop 38-->72-->115--> CXR no acute process CTA chest aneurysal dilatation 4.1 cm ascending thoracic aorta EKG SR, new lateral precordial TWIs, old infeiror infarct  09/2018 echo LVE 50-55%, grade I DDx, normal RV function  Presents with chest pain concerning for NSTEMI. Mild trop but ongoing + delta, EKG with new lateral precordial TWIs. Low bp's have resolved, entresto is on hold, may restart tomorrow pending ongoing bp trends. GIven trop trend, EKG changes, extensive CAD history abnd symptoms we will plan for a cath tomorrow to further evaluate    Carlyle Dolly MD

## 2020-03-01 NOTE — Progress Notes (Signed)
Check patient's blood pressure in both arms while sitting up on side of bed. Right arm: 133/84 (99) Left arm: 132/88 (102)

## 2020-03-01 NOTE — Progress Notes (Deleted)
Checked blood pressure in both arms while sitting up on side of bed. Right arm: 102/87 (93 Left arm: 116/86 (94

## 2020-03-02 ENCOUNTER — Encounter (HOSPITAL_COMMUNITY)
Admission: EM | Disposition: A | Discharge status: Other Acute Inpatient Hospital | Payer: Self-pay | Source: Home / Self Care | Attending: Internal Medicine

## 2020-03-02 ENCOUNTER — Encounter (HOSPITAL_COMMUNITY): Payer: Self-pay | Admitting: Cardiology

## 2020-03-02 DIAGNOSIS — R778 Other specified abnormalities of plasma proteins: Secondary | ICD-10-CM

## 2020-03-02 DIAGNOSIS — I2510 Atherosclerotic heart disease of native coronary artery without angina pectoris: Secondary | ICD-10-CM

## 2020-03-02 DIAGNOSIS — E782 Mixed hyperlipidemia: Secondary | ICD-10-CM

## 2020-03-02 DIAGNOSIS — I25118 Atherosclerotic heart disease of native coronary artery with other forms of angina pectoris: Secondary | ICD-10-CM

## 2020-03-02 HISTORY — PX: LEFT HEART CATH AND CORONARY ANGIOGRAPHY: CATH118249

## 2020-03-02 HISTORY — PX: INTRAVASCULAR PRESSURE WIRE/FFR STUDY: CATH118243

## 2020-03-02 LAB — POCT ACTIVATED CLOTTING TIME
Activated Clotting Time: 257 seconds
Activated Clotting Time: 263 seconds

## 2020-03-02 LAB — CBC
HCT: 46.7 % (ref 39.0–52.0)
Hemoglobin: 14.9 g/dL (ref 13.0–17.0)
MCH: 30 pg (ref 26.0–34.0)
MCHC: 31.9 g/dL (ref 30.0–36.0)
MCV: 94.2 fL (ref 80.0–100.0)
Platelets: 138 10*3/uL — ABNORMAL LOW (ref 150–400)
RBC: 4.96 MIL/uL (ref 4.22–5.81)
RDW: 13.6 % (ref 11.5–15.5)
WBC: 5.8 10*3/uL (ref 4.0–10.5)
nRBC: 0 % (ref 0.0–0.2)

## 2020-03-02 LAB — GLUCOSE, CAPILLARY
Glucose-Capillary: 102 mg/dL — ABNORMAL HIGH (ref 70–99)
Glucose-Capillary: 92 mg/dL (ref 70–99)
Glucose-Capillary: 147 mg/dL — ABNORMAL HIGH (ref 70–99)

## 2020-03-02 LAB — HEPARIN LEVEL (UNFRACTIONATED): Heparin Unfractionated: 0.44 IU/mL (ref 0.30–0.70)

## 2020-03-02 SURGERY — LEFT HEART CATH AND CORONARY ANGIOGRAPHY
Anesthesia: LOCAL

## 2020-03-02 MED ORDER — ADENOSINE 12 MG/4ML IV SOLN
INTRAVENOUS | Status: AC
  Filled 2020-03-02: qty 4

## 2020-03-02 MED ORDER — HEPARIN SODIUM (PORCINE) 1000 UNIT/ML IJ SOLN
INTRAMUSCULAR | Status: DC | PRN
  Administered 2020-03-02: 6000 [IU] via INTRAVENOUS
  Administered 2020-03-02: 3000 [IU] via INTRAVENOUS
  Administered 2020-03-02: 5000 [IU] via INTRAVENOUS

## 2020-03-02 MED ORDER — HEPARIN (PORCINE) IN NACL 1000-0.9 UT/500ML-% IV SOLN
INTRAVENOUS | Status: AC
  Filled 2020-03-02: qty 1000

## 2020-03-02 MED ORDER — LIDOCAINE HCL (PF) 1 % IJ SOLN
INTRAMUSCULAR | Status: DC | PRN
  Administered 2020-03-02: 5 mL

## 2020-03-02 MED ORDER — FENTANYL CITRATE (PF) 100 MCG/2ML IJ SOLN
INTRAMUSCULAR | Status: AC
  Filled 2020-03-02: qty 2

## 2020-03-02 MED ORDER — HYDRALAZINE HCL 20 MG/ML IJ SOLN: 10.0000 mg | INTRAMUSCULAR | Status: AC | PRN

## 2020-03-02 MED ORDER — VERAPAMIL HCL 2.5 MG/ML IV SOLN
INTRAVENOUS | Status: AC
  Filled 2020-03-02: qty 2

## 2020-03-02 MED ORDER — HEPARIN SODIUM (PORCINE) 1000 UNIT/ML IJ SOLN
INTRAMUSCULAR | Status: AC
  Filled 2020-03-02: qty 1

## 2020-03-02 MED ORDER — SODIUM CHLORIDE 0.9% FLUSH: 3.0000 mL | Freq: Two times a day (BID) | INTRAVENOUS | Status: DC

## 2020-03-02 MED ORDER — LABETALOL HCL 5 MG/ML IV SOLN: 10.0000 mg | INTRAVENOUS | Status: AC | PRN

## 2020-03-02 MED ORDER — SODIUM CHLORIDE 0.9 % IV SOLN: 250.0000 mL | INTRAVENOUS | Status: DC | PRN

## 2020-03-02 MED ORDER — MIDAZOLAM HCL 2 MG/2ML IJ SOLN
INTRAMUSCULAR | Status: AC
  Filled 2020-03-02: qty 2

## 2020-03-02 MED ORDER — LIDOCAINE HCL (PF) 1 % IJ SOLN
INTRAMUSCULAR | Status: AC
  Filled 2020-03-02: qty 30

## 2020-03-02 MED ORDER — ADENOSINE (DIAGNOSTIC) 140MCG/KG/MIN
INTRAVENOUS | Status: DC | PRN
  Administered 2020-03-02: 140 ug/kg/min via INTRAVENOUS

## 2020-03-02 MED ORDER — SODIUM CHLORIDE 0.9% FLUSH: 3.0000 mL | INTRAVENOUS | Status: DC | PRN

## 2020-03-02 MED ORDER — APIXABAN 5 MG PO TABS: 5.0000 mg | ORAL_TABLET | Freq: Two times a day (BID) | ORAL | Status: DC

## 2020-03-02 MED ORDER — VERAPAMIL HCL 2.5 MG/ML IV SOLN
INTRAVENOUS | Status: DC | PRN
  Administered 2020-03-02: 10 mL via INTRA_ARTERIAL

## 2020-03-02 MED ORDER — HEPARIN (PORCINE) IN NACL 1000-0.9 UT/500ML-% IV SOLN
INTRAVENOUS | Status: DC | PRN
  Administered 2020-03-02: 500 mL

## 2020-03-02 MED ORDER — SODIUM CHLORIDE 0.9 % IV SOLN: INTRAVENOUS | Status: AC

## 2020-03-02 MED ORDER — ENTRESTO 24-26 MG PO TABS: 1.0000 | ORAL_TABLET | Freq: Two times a day (BID) | ORAL | 6 refills | Status: DC

## 2020-03-02 MED ORDER — FENTANYL CITRATE (PF) 100 MCG/2ML IJ SOLN
INTRAMUSCULAR | Status: DC | PRN
Start: 2020-03-02 — End: 2020-03-02
  Administered 2020-03-02: 25 ug via INTRAVENOUS

## 2020-03-02 MED ORDER — MIDAZOLAM HCL 2 MG/2ML IJ SOLN
INTRAMUSCULAR | Status: DC | PRN
  Administered 2020-03-02: 1 mg via INTRAVENOUS

## 2020-03-02 SURGICAL SUPPLY — 16 items
CATH OPTITORQUE TIG 4.0 5F (CATHETERS) ×2 IMPLANT
CATH VISTA GUIDE 6FR JR3.5 (CATHETERS) ×2 IMPLANT
CATH VISTA GUIDE 6FR JR4 (CATHETERS) ×2 IMPLANT
CATH VISTA GUIDE 6FR XBLAD3.5 (CATHETERS) ×2 IMPLANT
DEVICE RAD COMP TR BAND LRG (VASCULAR PRODUCTS) ×2 IMPLANT
GLIDESHEATH SLEND SS 6F .021 (SHEATH) ×2 IMPLANT
GUIDEWIRE INQWIRE 1.5J.035X260 (WIRE) ×1 IMPLANT
GUIDEWIRE PRESSURE COMET II (WIRE) ×2 IMPLANT
INQWIRE 1.5J .035X260CM (WIRE) ×2
KIT ESSENTIALS PG (KITS) ×2 IMPLANT
KIT HEART LEFT (KITS) ×2 IMPLANT
PACK CARDIAC CATHETERIZATION (CUSTOM PROCEDURE TRAY) ×2 IMPLANT
SHEATH PROBE COVER 6X72 (BAG) ×2 IMPLANT
TRANSDUCER W/STOPCOCK (MISCELLANEOUS) ×2 IMPLANT
TUBING CIL FLEX 10 FLL-RA (TUBING) ×2 IMPLANT
WIRE HI TORQ VERSACORE-J 145CM (WIRE) ×2 IMPLANT

## 2020-03-02 NOTE — Progress Notes (Addendum)
[id number] Received order per post cath order. Chart reviewed. Pt did not have PCI so no referral to CRP 2. Pt sleeping soundly now. Left diabetic and heart healthy diets at bedside. Cardiologist can order CRP 2 per stable angina diagnosis if pt interested . Graylon Good RN BSN 03/02/2020 2:35 PM

## 2020-03-02 NOTE — Discharge Instructions (Signed)
Radial Site Care  This sheet gives you information about how to care for yourself after your procedure. Your health care provider may also give you more specific instructions. If you have problems or questions, contact your health care provider. What can I expect after the procedure? After the procedure, it is common to have:  Bruising and tenderness at the catheter insertion area. Follow these instructions at home: Medicines  Take over-the-counter and prescription medicines only as told by your health care provider. Insertion site care  Follow instructions from your health care provider about how to take care of your insertion site. Make sure you: ? Wash your hands with soap and water before you change your bandage (dressing). If soap and water are not available, use hand sanitizer. ? Change your dressing as told by your health care provider. ? Leave stitches (sutures), skin glue, or adhesive strips in place. These skin closures may need to stay in place for 2 weeks or longer. If adhesive strip edges start to loosen and curl up, you may trim the loose edges. Do not remove adhesive strips completely unless your health care provider tells you to do that.  Check your insertion site every day for signs of infection. Check for: ? Redness, swelling, or pain. ? Fluid or blood. ? Pus or a bad smell. ? Warmth.  Do not take baths, swim, or use a hot tub until your health care provider approves.  You may shower 24-48 hours after the procedure, or as directed by your health care provider. ? Remove the dressing and gently wash the site with plain soap and water. ? Pat the area dry with a clean towel. ? Do not rub the site. That could cause bleeding.  Do not apply powder or lotion to the site. Activity   For 24 hours after the procedure, or as directed by your health care provider: ? Do not flex or bend the affected arm. ? Do not push or pull heavy objects with the affected arm. ? Do not  drive yourself home from the hospital or clinic. You may drive 24 hours after the procedure unless your health care provider tells you not to. ? Do not operate machinery or power tools.  Do not lift anything that is heavier than 10 lb (4.5 kg), or the limit that you are told, until your health care provider says that it is safe.  Ask your health care provider when it is okay to: ? Return to work or school. ? Resume usual physical activities or sports. ? Resume sexual activity. General instructions  If the catheter site starts to bleed, raise your arm and put firm pressure on the site. If the bleeding does not stop, get help right away. This is a medical emergency.  If you went home on the same day as your procedure, a responsible adult should be with you for the first 24 hours after you arrive home.  Keep all follow-up visits as told by your health care provider. This is important. Contact a health care provider if:  You have a fever.  You have redness, swelling, or yellow drainage around your insertion site. Get help right away if:  You have unusual pain at the radial site.  The catheter insertion area swells very fast.  The insertion area is bleeding, and the bleeding does not stop when you hold steady pressure on the area.  Your arm or hand becomes pale, cool, tingly, or numb. These symptoms may represent a serious problem  that is an emergency. Do not wait to see if the symptoms will go away. Get medical help right away. Call your local emergency services (911 in the [place] Do not drive yourself to the hospital. Summary  After the procedure, it is common to have bruising and tenderness at the site.  Follow instructions from your health care provider about how to take care of your radial site wound. Check the wound every day for signs of infection.  Do not lift anything that is heavier than 10 lb (4.5 kg), or the limit that you are told, until your health care provider says  that it is safe. This information is not intended to replace advice given to you by your health care provider. Make sure you discuss any questions you have with your health care provider. Document Revised: 07/11/2017 Document Reviewed: 07/11/2017 Elsevier Patient Education  2020 [place] at [phone] if any bleeding, swelling or drainage at cath site.  May shower, no tub baths for 48 hours for groin sticks. No lifting over 5 pounds for 3 days.  No Driving for 3 days  We reduced your dose of Entresto if you have episodes of lightheadedness again with low BP, hold the entresto and call Dr. Evette Georges office.    Resume Eliquis tonight   Heart Healthy diabetic diet.

## 2020-03-02 NOTE — Progress Notes (Addendum)
Progress Note  Patient Name: John [name] Date of Encounter: 03/02/2020  [facility] Cardiologist: Shelva Majestic, MD   Subjective   No chest pain this morning. Planned for cath today.   Inpatient Medications    Scheduled Meds: . aspirin EC  81 mg Oral QHS  . budesonide (PULMICORT) nebulizer solution  0.5 mg Nebulization BID  . insulin aspart  0-15 Units Subcutaneous TID WC  . insulin aspart  0-5 Units Subcutaneous QHS  . insulin detemir  15 Units Subcutaneous QHS  . montelukast  10 mg Oral q AM  . nebivolol  2.5 mg Oral Daily  . rosuvastatin  20 mg Oral QPM  . sodium chloride flush  3 mL Intravenous Q12H   Continuous Infusions: . sodium chloride    . sodium chloride 1 mL/kg/hr (03/02/20 0550)  . heparin 1,400 Units/hr (03/02/20 0119)   PRN Meds: sodium chloride, acetaminophen, albuterol, nitroGLYCERIN, ondansetron (ZOFRAN) IV, sodium chloride flush   Vital Signs    Vitals:   03/01/20 2056 03/01/20 2341 03/02/20 0425 03/02/20 0431  BP:  120/73 117/84   Pulse:  (!) 57 (!) 59   Resp:  19 17   Temp:  98.1 F (36.7 C) 97.7 F (36.5 C)   TempSrc:  Oral Oral   SpO2: 95% 92% 93%   Weight:    99.3 kg  Height:        Intake/Output Summary (Last 24 hours) at 03/02/2020 0637 Last data filed at 03/01/2020 2000 Gross per 24 hour  Intake 760 ml  Output 550 ml  Net 210 ml   Last 3 Weights 03/02/2020 02/29/2020 02/28/2020  Weight (lbs) 219 lb 220 lb 3.8 oz 222 lb  Weight (kg) 99.338 kg 99.9 kg 100.699 kg      Telemetry    SB, PACs - Personally Reviewed  ECG    No new tracing this morning.  Physical Exam  Pleasant older WM GEN: No acute distress.   Neck: No JVD Cardiac: RRR, no murmurs, rubs, or gallops.  Respiratory: Clear to auscultation bilaterally. GI: Soft, nontender, non-distended  MS: No edema; No deformity. Neuro:  Nonfocal  Psych: Normal affect   Labs    High Sensitivity Troponin:   Recent Labs  Lab 02/28/20 1910 02/28/20 2049  03/01/20 0849  TROPONINIHS 38* 72* 115*      Chemistry Recent Labs  Lab 02/28/20 1910  NA 143  K 4.1  CL 107  CO2 25  GLUCOSE 168*  BUN 23  CREATININE 1.15  CALCIUM 9.6  PROT 6.8  ALBUMIN 4.1  AST 32  ALT 38  ALKPHOS 67  BILITOT 0.7  GFRNONAA >60  GFRAA >60  ANIONGAP 11     Hematology Recent Labs  Lab 02/28/20 1910 03/01/20 0307  WBC 10.8* 6.8  RBC 5.90* 5.20  HGB 17.7* 15.4  HCT 55.4* 49.2  MCV 93.9 94.6  MCH 30.0 29.6  MCHC 31.9 31.3  RDW 13.8 13.7  PLT 182 148*    BNPNo results for input(s): BNP, PROBNP in the last 168 hours.   DDimer No results for input(s): DDIMER in the last 168 hours.   Radiology    ECHOCARDIOGRAM COMPLETE  Result Date: 03/01/2020    ECHOCARDIOGRAM REPORT   Patient Name:   John [name] Date of Exam: 03/01/2020 Medical Rec #:  [id number]        Height:       72.0 in Accession #:    [id number]       Weight:  220.2 lb Date of Birth:  26-May-1951        BSA:          2.220 m Patient Age:    69 years         BP:           132/88 mmHg Patient Gender: M                HR:           60 bpm. Exam Location:  [facility] Procedure: 2D Echo, Cardiac Doppler and Color Doppler Indications:    Chest Pain 786.50 / R07.9  History:        Patient has prior history of Echocardiogram examinations, most                 recent 08/19/2018. CAD and Previous Myocardial Infarction, COPD,                 Arrythmias:Atrial Flutter; Risk Factors:Hypertension,                 Dyslipidemia, Diabetes and Former Smoker. Anticoagulation                 adequate, OSA on CPAP.  Sonographer:    Alvino Chapel RCS Referring Phys: [phone] [place]  1. Left ventricular ejection fraction, by estimation, is 45 to 50%. The left ventricle has mildly decreased function. The left ventricle demonstrates global hypokinesis. Left ventricular diastolic parameters are indeterminate.  2. Right ventricular systolic function is normal. The right ventricular size is normal.  3.  Left atrial size was severely dilated.  4. The mitral valve is normal in structure. Trivial mitral valve regurgitation. No evidence of mitral stenosis.  5. The aortic valve is tricuspid. Aortic valve regurgitation is not visualized. No aortic stenosis is present.  6. Aortic dilatation noted. There is mild dilatation of the aortic root, measuring 41 mm.  7. The inferior vena cava is dilated in size with >50% respiratory variability, suggesting right atrial pressure of 8 mmHg. FINDINGS  Left Ventricle: Left ventricular ejection fraction, by estimation, is 45 to 50%. The left ventricle has mildly decreased function. The left ventricle demonstrates global hypokinesis. The left ventricular internal cavity size was normal in size. There is  no left ventricular hypertrophy. Left ventricular diastolic parameters are indeterminate. Right Ventricle: The right ventricular size is normal. No increase in right ventricular wall thickness. Right ventricular systolic function is normal. Left Atrium: Left atrial size was severely dilated. Right Atrium: Right atrial size was not well visualized. Pericardium: There is no evidence of pericardial effusion. Mitral Valve: The mitral valve is normal in structure. Trivial mitral valve regurgitation. No evidence of mitral valve stenosis. Tricuspid Valve: The tricuspid valve is normal in structure. Tricuspid valve regurgitation is trivial. No evidence of tricuspid stenosis. Aortic Valve: The aortic valve is tricuspid. Aortic valve regurgitation is not visualized. No aortic stenosis is present. Aortic valve mean gradient measures 2.9 mmHg. Aortic valve peak gradient measures 5.3 mmHg. Aortic valve area, by VTI measures 3.26 cm. Pulmonic Valve: The pulmonic valve was not well visualized. Pulmonic valve regurgitation is mild. No evidence of pulmonic stenosis. Aorta: Aortic dilatation noted. There is mild dilatation of the aortic root, measuring 41 mm. Pulmonary Artery: Indeterminant PASP,  inadequate TR jet. Venous: The inferior vena cava is dilated in size with greater than 50% respiratory variability, suggesting right atrial pressure of 8 mmHg. IAS/Shunts: The interatrial septum was not well visualized.  LEFT  VENTRICLE PLAX 2D LVIDd:         5.80 cm      Diastology LVIDs:         4.99 cm      LV e' medial:    5.87 cm/s LV PW:         0.98 cm      LV E/e' medial:  11.8 LV IVS:        1.06 cm      LV e' lateral:   5.44 cm/s LVOT diam:     2.30 cm      LV E/e' lateral: 12.8 LV SV:         86 LV SV Index:   39 LVOT Area:     4.15 cm  LV Volumes (MOD) LV vol d, MOD A2C: 184.0 ml LV vol d, MOD A4C: 147.5 ml LV vol s, MOD A2C: 118.0 ml LV vol s, MOD A4C: 87.0 ml LV SV MOD A2C:     66.0 ml LV SV MOD A4C:     147.5 ml LV SV MOD BP:      63.8 ml RIGHT VENTRICLE TAPSE (M-mode): 2.1 cm LEFT ATRIUM              Index       RIGHT ATRIUM           Index LA diam:        4.70 cm  2.12 cm/m  RA Area:     22.30 cm LA Vol (A2C):   82.0 ml  36.94 ml/m RA Volume:   69.60 ml  31.36 ml/m LA Vol (A4C):   105.0 ml 47.30 ml/m LA Biplane Vol: 93.0 ml  41.90 ml/m  AORTIC VALVE AV Area (Vmax):    3.64 cm AV Area (Vmean):   3.51 cm AV Area (VTI):     3.26 cm AV Vmax:           115.33 cm/s AV Vmean:          80.841 cm/s AV VTI:            0.265 m AV Peak Grad:      5.3 mmHg AV Mean Grad:      2.9 mmHg LVOT Vmax:         101.00 cm/s LVOT Vmean:        68.200 cm/s LVOT VTI:          0.208 m LVOT/AV VTI ratio: 0.79  AORTA Ao Root diam: 4.10 cm MITRAL VALVE               TRICUSPID VALVE MV Area (PHT): 3.11 cm    TR Peak grad:   17.0 mmHg MV Decel Time: 244 msec    TR Vmax:        206.00 cm/s MV E velocity: 69.50 cm/s MV A velocity: 69.50 cm/s  SHUNTS MV E/A ratio:  1.00        Systemic VTI:  0.21 m                            Systemic Diam: 2.30 cm Carlyle Dolly MD Electronically signed by Carlyle Dolly MD Signature Date/Time: 03/01/2020/4:08:49 PM    Final     Cardiac Studies   Echo: 03/01/20  IMPRESSIONS    1.  Left ventricular ejection fraction, by estimation, is 45 to 50%. The  left ventricle has mildly decreased function. The left ventricle  demonstrates global  hypokinesis. Left ventricular diastolic parameters are  indeterminate.  2. Right ventricular systolic function is normal. The right ventricular  size is normal.  3. Left atrial size was severely dilated.  4. The mitral valve is normal in structure. Trivial mitral valve  regurgitation. No evidence of mitral stenosis.  5. The aortic valve is tricuspid. Aortic valve regurgitation is not  visualized. No aortic stenosis is present.  6. Aortic dilatation noted. There is mild dilatation of the aortic root,  measuring 41 mm.  7. The inferior vena cava is dilated in size with >50% respiratory  variability, suggesting right atrial pressure of 8 mmHg.   Patient Profile     69 y.o. male with a hx of CAD (multiple prior PCIs reported in the RCA and Cx, last stenting in RCA 2010), ischemic cardiomyopathy, HTN, HLD, DM, OSA, TAA, interstitial lung disease and pulmonary nodule followed by pulmonology, and paroxysmal atrial flutter s/p prior cardioversion who was seen for the evaluation of chest pain/hypotension at the request of Dr. Josephine Cables.  Assessment & Plan    1. Chest pain with elevated troponin: hsTn 72>>115. Remains on IV heparin, planned for cardiac cath today. No chest pain overnight.  -- on ASA 81mg , statin, and BB   2. History of ischemic cardiomyopathy - Entresto held 2/2 hypotension, blood pressures remain borderline this morning. No room to titrate BB therapy at this time given his baseline bradycardia. Volume stable. - Echo this admission noting EF of 45-50% with global hypokinesis.  3. Possible claudication - can update ABIs as OP  4. Diabetes mellitus - on SSI, and Lantus - plan to resume Jardiance when no further procedures needed  5. HLD - LDL 61 on Crestor this admission, continue statin  6. H/o atrial  flutter - NSR with PACs/PVCs on telemetry - Eliquis on hold 2/2 potential need for cath, continue IV heparin  7. Ascending TAA - anticipate continued outpatient f/u   For questions or updates, please contact [place] Please consult [email or web address] for contact info under        Signed, Reino Bellis, NP  03/02/2020, 6:37 AM     I have examined the patient and reviewed assessment and plan and discussed with patient.  Agree with above as stated.  Prior caths have been done from the groin.  3+ right radial pulse.  Further plans based on result of cath. All questions about cath answered.  Larae Grooms

## 2020-03-02 NOTE — Progress Notes (Signed)
TR band deflated and removed per protocol. Patient ambulated without difficulty, no complaints of chest pain. VSS. Discharge instructions reviewed and given to patient. All questions answered. Patient discharged via wheelchair in stable in condition into the care of his son.

## 2020-03-02 NOTE — Discharge Summary (Addendum)
Discharge Summary    Patient ID: John [name] MRN: [id number]; DOB: 1950/11/04  Admit date: 02/28/2020 Discharge date: 03/02/2020  Primary Care Provider: [name], MD  Primary Cardiologist: Shelva Majestic, MD  Primary Electrophysiologist:  None   Discharge Diagnoses    Principal Problem:   Chest pain Active Problems:   CAD, multiple prior RCA PCI's. Last cath 2010, Myoview low risk Oct 2012   HTN (hypertension)   COPD (chronic obstructive pulmonary disease) (HCC)   GERD (gastroesophageal reflux disease)   Hyperlipidemia   Hyperglycemia due to diabetes mellitus ([place])   Thoracic aortic aneurysm (HCC)   Hypotensive episode   Obesity (BMI 30.0-34.9)   Elevated troponin    Diagnostic Studies/Procedures    Echo: 03/01/20  IMPRESSIONS    1. Left ventricular ejection fraction, by estimation, is 45 to 50%. The  left ventricle has mildly decreased function. The left ventricle  demonstrates global hypokinesis. Left ventricular diastolic parameters are  indeterminate.  2. Right ventricular systolic function is normal. The right ventricular  size is normal.  3. Left atrial size was severely dilated.  4. The mitral valve is normal in structure. Trivial mitral valve  regurgitation. No evidence of mitral stenosis.  5. The aortic valve is tricuspid. Aortic valve regurgitation is not  visualized. No aortic stenosis is present.  6. Aortic dilatation noted. There is mild dilatation of the aortic root,  measuring 41 mm.  7. The inferior vena cava is dilated in size with >50% respiratory  variability, suggesting right atrial pressure of 8 mmHg.  _____________  cardiac cath 03/02/20  Ost Cx to Prox Cx lesion is 55% stenosed. Prox Cx lesion is 40% stenosed with 60% stenosed side branch in 1st Mrg. => Tandem lesions were evaluated with DFR (0.95), and FFR (0.94) = not physiologically significant  Mid LAD lesion is 30% stenosed.  Prox RCA lesion is 55% stenosed.  Lesion was evaluated with DFR (0.98)  Previously placed stents in the prox RCA to Dist RCA are 15% in-stent restenosis  Previously placed stents extend into RPAV-1 lesion into the distal trifurcation, 15% in-stent restenosis  RPAV-2 lesion is 45% stenosed.  There is no aortic valve stenosis.   SUMMARY  Mild in-stent restenosis throughout the entire proximal to distal RCA-RP AV with 50% eccentric lesions prior to and after stents. ->  DFR negative  Tandem 50 and 40% lesions in ostial/proximal LCx-DFR and FFR negative.  Normal LVEDP.   RECOMMENDATIONS  RETURN to nursing unit for ongoing care.  Stable for discharge today based on cardiac cath.  Continue aggressive risk factor modification/Guideline Directed Medical Therapy  Diagnostic Dominance: Right    History of Present Illness     John [name] is a 68 y.o. male with hx of CAD, multiple prior PCIs in RCA and LCX, last stent in RCA 2010.  Other hx of ICM, HTN, HLD, DM, OSA, TAA, interstitial lung disease and pulmonary nodule followed by pulmonary and PAFL with hx of DCCV and was admitted to [facility] with chest pain 02/28/20.   Neg nuc in 2018 and Echo 08/2018 with EF 50-55%.   Pt had been doing well until last week and became lightheaded and BP was low in the 53I systolic.  He felt better but then more recently another episode.  Despite increasing fluids his BP remained low.  He then developed Lt sided chest pain with radiation to neck.  He took sl NTG and BP dropped to 14E systolic but chest pain improved.  In ER IV fluids given, and Eliquis held.  On bystolic but entresto held   CTA chest aneurysal dilatation 4.1 cm ascending thoracic aorta EKG SR, new lateral precordial TWIs, old infeiror infarct hstrop 38-->72-->115-->  Pt was admitted by hospitalist then after Cards consult, pt transferred to [facility] for cardiac cath.    Hospital Course     Consultants: Dr. Harl Bowie with cardiology   Pt arrived at [facility] and today  underwent cardiac cath with results as above and plan for medical therapy.   Post cath pt did well.  And once post cath orders are complete can be discharged.  We will resume entresto at lower dose. To resume eliquis tonight.    He well have follow up in 1-2 weeks.  He has been seen and evaluated by Dr. Irish Lack and found stable for discharge.   Did the patient have an acute coronary syndrome (MI, NSTEMI, STEMI, etc) this admission?:  No                               Did the patient have a percutaneous coronary intervention (stent / angioplasty)?:  No.   _____________  Discharge Vitals Blood pressure 125/74, pulse (!) 56, temperature 98 F (36.7 C), temperature source Oral, resp. rate 16, height 6' (1.829 m), weight 99.3 kg, SpO2 94 %.  Filed Weights   02/28/20 1859 02/29/20 1535 03/02/20 0431  Weight: 100.7 kg 99.9 kg 99.3 kg    Labs & Radiologic Studies    CBC Recent Labs    02/28/20 1910 02/28/20 1910 03/01/20 0307 03/02/20 0545  WBC 10.8*   < > 6.8 5.8  NEUTROABS 8.2*  --   --   --   HGB 17.7*   < > 15.4 14.9  HCT 55.4*   < > 49.2 46.7  MCV 93.9   < > 94.6 94.2  PLT 182   < > 148* 138*   < > = values in this interval not displayed.   Basic Metabolic Panel Recent Labs    02/28/20 1910  NA 143  K 4.1  CL 107  CO2 25  GLUCOSE 168*  BUN 23  CREATININE 1.15  CALCIUM 9.6   Liver Function Tests Recent Labs    02/28/20 1910  AST 32  ALT 38  ALKPHOS 67  BILITOT 0.7  PROT 6.8  ALBUMIN 4.1   No results for input(s): LIPASE, AMYLASE in the last 72 hours. High Sensitivity Troponin:   Recent Labs  Lab 02/28/20 1910 02/28/20 2049 03/01/20 0849  TROPONINIHS 38* 72* 115*    BNP Invalid input(s): POCBNP D-Dimer No results for input(s): DDIMER in the last 72 hours. Hemoglobin A1C Recent Labs    02/28/20 1910  HGBA1C 7.7*   Fasting Lipid Panel Recent Labs    02/28/20 1910  CHOL 115  HDL 35*  LDLCALC 61  TRIG 93  CHOLHDL 3.3   Thyroid Function  Tests No results for input(s): TSH, T4TOTAL, T3FREE, THYROIDAB in the last 72 hours.  Invalid input(s): FREET3 _____________  CARDIAC CATHETERIZATION  Result Date: 03/02/2020  Ost Cx to Prox Cx lesion is 55% stenosed. Prox Cx lesion is 40% stenosed with 60% stenosed side branch in 1st Mrg. => Tandem lesions were evaluated with DFR (0.95), and FFR (0.94) = not physiologically significant  Mid LAD lesion is 30% stenosed.  Prox RCA lesion is 55% stenosed. Lesion was evaluated with DFR (0.98)  Previously placed stents in  the prox RCA to Dist RCA are 15% in-stent restenosis  Previously placed stents extend into RPAV-1 lesion into the distal trifurcation, 15% in-stent restenosis  RPAV-2 lesion is 45% stenosed.  There is no aortic valve stenosis.  SUMMARY  Mild in-stent restenosis throughout the entire proximal to distal RCA-RP AV with 50% eccentric lesions prior to and after stents. ->  DFR negative  Tandem 50 and 40% lesions in ostial/proximal LCx-DFR and FFR negative.  Normal LVEDP. RECOMMENDATIONS  RETURN to nursing unit for ongoing care.  Stable for discharge today based on cardiac cath.  Continue aggressive risk factor modification/Guideline Directed Medical Therapy Glenetta Hew, MD  DG Chest Port 1 View  Result Date: 02/28/2020 CLINICAL DATA:  Lightheadedness, nausea, back pain and shortness of breath starting today while washing a car. Previous cardiac history. EXAM: PORTABLE CHEST 1 VIEW COMPARISON:  CT chest 02/10/2020 FINDINGS: Cardiac enlargement. No vascular congestion, edema, or consolidation. No pleural effusions. No pneumothorax. Mild elevation of the left hemidiaphragm is unchanged. Mediastinal contours appear intact. IMPRESSION: Cardiac enlargement.  No evidence of active pulmonary disease. Electronically Signed   By: Lucienne Capers M.D.   On: 02/28/2020 19:59   ECHOCARDIOGRAM COMPLETE  Result Date: 03/01/2020    ECHOCARDIOGRAM REPORT   Patient Name:   John [name] Date  of Exam: 03/01/2020 Medical Rec #:  [id number]        Height:       72.0 in Accession #:    [id number]       Weight:       220.2 lb Date of Birth:  August 19, 1950        BSA:          2.220 m Patient Age:    23 years         BP:           132/88 mmHg Patient Gender: M                HR:           60 bpm. Exam Location:  [facility] Procedure: 2D Echo, Cardiac Doppler and Color Doppler Indications:    Chest Pain 786.50 / R07.9  History:        Patient has prior history of Echocardiogram examinations, most                 recent 08/19/2018. CAD and Previous Myocardial Infarction, COPD,                 Arrythmias:Atrial Flutter; Risk Factors:Hypertension,                 Dyslipidemia, Diabetes and Former Smoker. Anticoagulation                 adequate, OSA on CPAP.  Sonographer:    Alvino Chapel RCS Referring Phys: [phone] [place]  1. Left ventricular ejection fraction, by estimation, is 45 to 50%. The left ventricle has mildly decreased function. The left ventricle demonstrates global hypokinesis. Left ventricular diastolic parameters are indeterminate.  2. Right ventricular systolic function is normal. The right ventricular size is normal.  3. Left atrial size was severely dilated.  4. The mitral valve is normal in structure. Trivial mitral valve regurgitation. No evidence of mitral stenosis.  5. The aortic valve is tricuspid. Aortic valve regurgitation is not visualized. No aortic stenosis is present.  6. Aortic dilatation noted. There is mild dilatation of the aortic root, measuring 41 mm.  7. The  inferior vena cava is dilated in size with >50% respiratory variability, suggesting right atrial pressure of 8 mmHg. FINDINGS  Left Ventricle: Left ventricular ejection fraction, by estimation, is 45 to 50%. The left ventricle has mildly decreased function. The left ventricle demonstrates global hypokinesis. The left ventricular internal cavity size was normal in size. There is  no left ventricular hypertrophy. Left  ventricular diastolic parameters are indeterminate. Right Ventricle: The right ventricular size is normal. No increase in right ventricular wall thickness. Right ventricular systolic function is normal. Left Atrium: Left atrial size was severely dilated. Right Atrium: Right atrial size was not well visualized. Pericardium: There is no evidence of pericardial effusion. Mitral Valve: The mitral valve is normal in structure. Trivial mitral valve regurgitation. No evidence of mitral valve stenosis. Tricuspid Valve: The tricuspid valve is normal in structure. Tricuspid valve regurgitation is trivial. No evidence of tricuspid stenosis. Aortic Valve: The aortic valve is tricuspid. Aortic valve regurgitation is not visualized. No aortic stenosis is present. Aortic valve mean gradient measures 2.9 mmHg. Aortic valve peak gradient measures 5.3 mmHg. Aortic valve area, by VTI measures 3.26 cm. Pulmonic Valve: The pulmonic valve was not well visualized. Pulmonic valve regurgitation is mild. No evidence of pulmonic stenosis. Aorta: Aortic dilatation noted. There is mild dilatation of the aortic root, measuring 41 mm. Pulmonary Artery: Indeterminant PASP, inadequate TR jet. Venous: The inferior vena cava is dilated in size with greater than 50% respiratory variability, suggesting right atrial pressure of 8 mmHg. IAS/Shunts: The interatrial septum was not well visualized.  LEFT VENTRICLE PLAX 2D LVIDd:         5.80 cm      Diastology LVIDs:         4.99 cm      LV e' medial:    5.87 cm/s LV PW:         0.98 cm      LV E/e' medial:  11.8 LV IVS:        1.06 cm      LV e' lateral:   5.44 cm/s LVOT diam:     2.30 cm      LV E/e' lateral: 12.8 LV SV:         86 LV SV Index:   39 LVOT Area:     4.15 cm  LV Volumes (MOD) LV vol d, MOD A2C: 184.0 ml LV vol d, MOD A4C: 147.5 ml LV vol s, MOD A2C: 118.0 ml LV vol s, MOD A4C: 87.0 ml LV SV MOD A2C:     66.0 ml LV SV MOD A4C:     147.5 ml LV SV MOD BP:      63.8 ml RIGHT VENTRICLE TAPSE  (M-mode): 2.1 cm LEFT ATRIUM              Index       RIGHT ATRIUM           Index LA diam:        4.70 cm  2.12 cm/m  RA Area:     22.30 cm LA Vol (A2C):   82.0 ml  36.94 ml/m RA Volume:   69.60 ml  31.36 ml/m LA Vol (A4C):   105.0 ml 47.30 ml/m LA Biplane Vol: 93.0 ml  41.90 ml/m  AORTIC VALVE AV Area (Vmax):    3.64 cm AV Area (Vmean):   3.51 cm AV Area (VTI):     3.26 cm AV Vmax:  115.33 cm/s AV Vmean:          80.841 cm/s AV VTI:            0.265 m AV Peak Grad:      5.3 mmHg AV Mean Grad:      2.9 mmHg LVOT Vmax:         101.00 cm/s LVOT Vmean:        68.200 cm/s LVOT VTI:          0.208 m LVOT/AV VTI ratio: 0.79  AORTA Ao Root diam: 4.10 cm MITRAL VALVE               TRICUSPID VALVE MV Area (PHT): 3.11 cm    TR Peak grad:   17.0 mmHg MV Decel Time: 244 msec    TR Vmax:        206.00 cm/s MV E velocity: 69.50 cm/s MV A velocity: 69.50 cm/s  SHUNTS MV E/A ratio:  1.00        Systemic VTI:  0.21 m                            Systemic Diam: 2.30 cm Carlyle Dolly MD Electronically signed by Carlyle Dolly MD Signature Date/Time: 03/01/2020/4:08:49 PM    Final    CT Angio Chest/Abd/Pel for Dissection W and/or Wo Contrast  Result Date: 02/28/2020 CLINICAL DATA:  Chest pain while washing car, lightheadedness, nausea and back pain, hypotensive at home, extensive cardiac history with multiple stents EXAM: CT ANGIOGRAPHY CHEST, ABDOMEN AND PELVIS TECHNIQUE: Non-contrast CT of the chest was initially obtained. Multidetector CT imaging through the chest, abdomen and pelvis was performed using the standard protocol during bolus administration of intravenous contrast. Multiplanar reconstructed images and MIPs were obtained and reviewed to evaluate the vascular anatomy. CONTRAST:  158mL OMNIPAQUE IOHEXOL 350 MG/ML SOLN COMPARISON:  CT chest 07/29/2019 FINDINGS: CTA CHEST FINDINGS Cardiovascular: Initial noncontrast CT imaging of the chest reveals a mildly dilated ascending thoracic aorta up to 4.1 cm  with at most minimal atherosclerotic plaque. No hyperdense mural thickening or plaque displacement present to suggest intramural hematoma. Postcontrast administration there is satisfactory preferential opacification of the thoracic aorta. Redemonstration of the mildly dilated ascending thoracic aorta to 4.1 cm returning to a normal caliber at the level of the distal aortic arch of approximately 2.8 cm. No acute luminal abnormality of the imaged aorta. No periaortic stranding or hemorrhage. Normal 3 vessel branching of the aortic arch. Proximal great vessels are unremarkable and free of acute luminal abnormality. Central pulmonary arteries are normal caliber. No large central filling defects on this non tailored, suboptimally opacified examination of the pulmonary arteries. Normal heart size. No pericardial effusion. Three-vessel coronary artery calcifications are present with multiple coronary stents. No pericardial effusion. Mediastinum/Nodes: No mediastinal fluid or gas. Normal thyroid gland and thoracic inlet. No acute abnormality of the trachea or esophagus. No worrisome mediastinal, hilar or axillary adenopathy. Lungs/Pleura: There is a triangular/geometric juxtapleural nodule in the right middle lobe abutting the minor fissure measuring up to 6 mm in size (7/86). Additional clustered sub 3 mm solid nodules are seen in the superior lingula (7/61). No other concerning pulmonary nodules or masses. Mild dependent atelectatic changes are present. No consolidation, features of edema, pneumothorax, or effusion. Musculoskeletal: The osseous structures appear diffusely demineralized which may limit detection of small or nondisplaced fractures. No acute osseous abnormality or suspicious osseous lesion. Multilevel degenerative changes are present in the imaged  portions of the spine. Multilevel flowing anterior osteophytosis, compatible with features of diffuse idiopathic skeletal hyperostosis (DISH). Additional  degenerative changes present in the bilateral shoulders. Review of the MIP images confirms the above findings. CTA ABDOMEN AND PELVIS FINDINGS VASCULAR Aorta: Calcified and noncalcified atheromatous plaque throughout the normal caliber abdominal aorta. No significant aneurysmal dilatation is seen. No acute luminal abnormality of the imaged aorta. No periaortic stranding or hemorrhage. Celiac: Mild ostial plaque narrowing. Otherwise normally opacified without other significant stenosis or occlusion. Typical branching pattern. No dissection, aneurysm or evidence of vasculitis. SMA: At most mild ostial plaque narrowing. Otherwise normally opacified without significant stenosis, occlusion or acute luminal abnormality or features of vasculitis. Renals: Single renal arteries bilaterally. Minimal plaque within the proximal right renal artery. No significant stenosis, occlusion, acute luminal abnormality or features of fibromuscular dysplasia or vasculitis. IMA: Moderate to severe ostial plaque narrowing at the IMA origin with the distal vessel normally opacified. No acute luminal abnormalities are evident. No aneurysm, ectasia or features of vasculitis. Inflow: Atheromatous plaque throughout the common, internal external iliac arteries. Minimal if any significant luminal narrowing. No acute luminal abnormalities. No features of vasculitis. Proximal outflow vessels including the common femoral, profundus femoral and superficial femoral arteries demonstrate some mild atheromatous plaque but are otherwise normally opacified and widely patent without acute luminal abnormality or features of vasculitis. Veins: Major venous structures are unremarkable within the limitations of this arterial phase examination. Review of the MIP images confirms the above findings. NON-VASCULAR Hepatobiliary: No worrisome focal liver lesions. Smooth liver surface contour. Normal hepatic attenuation. Normal gallbladder and biliary tree without  visible calcified gallstone. Pancreas: Partial fatty replacement of the pancreas. No pancreatic ductal dilatation or surrounding inflammatory changes. Spleen: Small accessory splenule near the pancreatic tail. Heterogeneous enhancement of the splenic parenchyma is quite typical for arterial phase of imaging. No splenomegaly or concerning focal splenic lesion. Adrenals/Urinary Tract: Normal adrenal glands. Kidneys enhance symmetrically and uniformly. There is a 3 cm fluid attenuation cyst arising from the upper pole right kidney. No concerning renal mass. No urolithiasis, hydronephrosis or concerning renal lesions. Urinary bladder is unremarkable. Stomach/Bowel: Stomach and duodenum are unremarkable. No small bowel thickening or dilatation. Some fecalization of the distal small bowel contents is nonspecific but could reflect some slowed intestinal transit without evidence of mechanical obstruction. Normal appendix in the right lower quadrant. No colonic dilatation or wall thickening. Scattered colonic diverticula without focal inflammation to suggest diverticulitis. Lymphatic: No suspicious or enlarged lymph nodes in the included lymphatic chains. Reproductive: Prostatomegaly with few coarse eccentric calcifications of prostate. No concerning focal lesions. Other: No abdominopelvic free fluid or free gas. No bowel containing hernias. Fat bilateral inguinal hernias, left greater than right. Focal subcutaneous skin thickening along the anterior abdominal wall likely reflecting prior injectable use. Mild posterior body wall edema. Musculoskeletal: Asymmetric fatty atrophy of the left vastus musculature, nonspecific. The osseous structures appear diffusely demineralized which may limit detection of small or nondisplaced fractures. No acute osseous abnormality or suspicious osseous lesion. Multilevel degenerative changes are present in the imaged portions of the spine. Near complete ankylosis of the bilateral SI joints as  well as a transitional lumbosacral vertebrae. Stable mild-to-moderate degenerative changes in the bilateral hips. Review of the MIP images confirms the above findings. IMPRESSION: VASCULAR 1. No evidence of acute aortic syndrome. 2. Aneurysmal dilatation of the ascending thoracic aorta up to 4.1 cm. Recommend annual imaging followup by CTA or MRA. This recommendation follows 2010 ACCF/AHA/AATS/ACR/ASA/SCA/SCAI/SIR/STS/SVM Guidelines for the Diagnosis  and Management of Patients with Thoracic Aortic Disease. Circulation. 2010; 121: E527-P824. Aortic aneurysm NOS (ICD10-I71.9) 3.  Aortic Atherosclerosis (ICD10-I70.0). 4. Moderate to severe plaque narrowing of the IMA origin. More mild plaque narrowing at the celiac, SMA and right renal artery origins. 5. Three-vessel coronary artery calcifications with multiple coronary stents. NONVASCULAR 1. No acute intrathoracic or intra-abdominal process. 2. Clustered appearance of several sub 5 mm solid nodules likely reflecting resolution of a larger more coalescent lesion previously seen in the lingula. Favoring a benign post infectious or inflammatory process. 3. Additional benign geometric nodule in the right middle lobe, favor intrapulmonary lymph node. 4. Scattered colonic diverticula without evidence of diverticulitis. 5. Some fecalization of the distal small bowel contents is nonspecific but could reflect some slowed intestinal transit without evidence of mechanical obstruction. 6. Prostatomegaly. Correlate for clinical features of outlet obstruction. Electronically Signed   By: Lovena Le M.D.   On: 02/28/2020 22:40   CT Super D Chest Wo Contrast  Result Date: 02/10/2020 CLINICAL DATA:  Follow-up of pulmonary nodule. Ex-smoker, quitting 2 years ago. EXAM: CT CHEST WITHOUT CONTRAST TECHNIQUE: Multidetector CT imaging of the chest was performed using thin slice collimation for electromagnetic bronchoscopy planning purposes, without intravenous contrast. COMPARISON:   07/29/2019 FINDINGS: Cardiovascular: Aortic atherosclerosis. Tortuous thoracic aorta. Mild cardiomegaly with sub endocardial hypoattenuation in left ventricular free wall, likely related to remote infarct. Dense multivessel coronary artery atherosclerosis. Mediastinum/Nodes: No mediastinal or definite hilar adenopathy, given limitations of unenhanced CT. Lungs/Pleura: No pleural fluid. A nodule along the right minor fissure represents a subpleural lymph node of 5 mm on 81/4. 2 mm right middle lobe pulmonary nodule on 91/4 is unchanged and can be presumed benign. The left upper lobe irregular opacity measures on the order of 9 mm on 63/4 versus 1.5 cm on 07/29/2019 and 1.6 cm back on 05/07/2019 (when remeasured). Favored to be linear, solid and branching when compared to sagittal reformats. More caudal lingular nodule is solid at 4 mm on 99/4 and not significantly changed. Upper Abdomen: Normal imaged portions of the liver, spleen, stomach, pancreas, gallbladder, adrenal glands, left kidney. Upper pole right renal cyst on the order of 2.4 cm. Musculoskeletal: Diffuse idiopathic skeletal hyperostosis throughout the thoracic spine. IMPRESSION: 1. Irregular lingular opacity is slightly decreased, especially when compared to 05/07/2019. Favor mucoid impaction within terminal bronchioles related to prior infection or inflammation. Consider 1 further follow-up at 6-12 months to confirm ongoing resolution. 2. Other pulmonary nodules are similar, favored to be benign. Recommend attention on follow-up. 3. Aortic atherosclerosis (ICD10-I70.0), coronary artery atherosclerosis and emphysema (ICD10-J43.9). Electronically Signed   By: Abigail Miyamoto M.D.   On: 02/10/2020 14:35   Disposition   Pt is being discharged home today in good condition.  Follow-up Plans & Appointments  Call [facility] at [phone] if any bleeding, swelling or drainage at cath site.  May shower, no tub baths for 48 hours for groin sticks.  No lifting over 5 pounds for 3 days.  No Driving for 3 days  We reduced your dose of Entresto if you have episodes of lightheadedness again with low BP, hold the entresto and call Dr. Evette Georges office.    Resume Eliquis tonight   Heart Healthy diabetic diet.       Follow-up Information    Troy Sine, MD Follow up on 03/12/2020.   Specialty: Cardiology Why: at 1:40 PM  Contact information: [address] [place] [address] [phone]  Discharge Medications   Allergies as of 03/02/2020      Reactions   Benazepril Other (See Comments)   hyperkalemia   Fish Allergy Itching   Omega-3 Fatty Acids Hives, Itching      Medication List    STOP taking these medications   Entresto 49-51 MG Generic drug: sacubitril-valsartan Replaced by: Delene Loll 24-26 MG     TAKE these medications   albuterol 108 (90 Base) MCG/ACT inhaler Commonly known as: VENTOLIN HFA Inhale 2 puffs into the lungs every 6 (six) hours as needed for wheezing or shortness of breath.   Arnuity Ellipta 200 MCG/ACT Aepb Generic drug: Fluticasone Furoate INHALE 1 PUFF BY MOUTH EVERY DAY What changed:   how much to take  how to take this  when to take this  additional instructions   aspirin 81 MG tablet Take 81 mg by mouth at bedtime.   Eliquis 5 MG Tabs tablet Generic drug: apixaban TAKE 1 TABLET BY MOUTH TWICE A DAY What changed: how much to take   Entresto 24-26 MG Generic drug: sacubitril-valsartan Take 1 tablet by mouth 2 (two) times daily. Replaces: Entresto 49-51 MG   Jardiance 25 MG Tabs tablet Generic drug: empagliflozin TAKE 1 TABLET BY MOUTH EVERY DAY What changed:   how much to take  when to take this   Levemir FlexTouch 100 UNIT/ML FlexPen Generic drug: insulin detemir INJECT 46 UNITS INTO THE SKIN DAILY. Dx:E11.9 What changed:   how much to take  how to take this  when to take this  additional instructions   montelukast 10  MG tablet Commonly known as: SINGULAIR TAKE 1 TABLET BY MOUTH EVERY DAY What changed: when to take this   nebivolol 2.5 MG tablet Commonly known as: Bystolic Take 1 tablet (2.5 mg total) by mouth daily.   nitroGLYCERIN 0.4 MG SL tablet Commonly known as: NITROSTAT PLACE 1 TABLET (0.4 MG TOTAL) UNDER THE TONGUE EVERY 5 (FIVE) MINUTES AS NEEDED FOR CHEST PAIN.   NovoFine 32G X 6 MM Misc Generic drug: Insulin Pen Needle 1 each by Other route daily.   NovoLOG FlexPen 100 UNIT/ML FlexPen Generic drug: insulin aspart 5-20 units with lunch and dinner What changed:   how much to take  how to take this  when to take this  reasons to take this  additional instructions   OneTouch Ultra test strip Generic drug: glucose blood USE TO CHECK BLOOD SUGAR 3 TIMES A DAY (E11.9)   rosuvastatin 20 MG tablet Commonly known as: CRESTOR Take 1 tablet (20 mg total) by mouth daily. What changed: when to take this          Outstanding Labs/Studies   BMP Consider orthostatic BP check.   Duration of Discharge Encounter   Greater than 30 minutes including physician time.  Signed, Cecilie Kicks, NP 03/02/2020, 4:13 PM  I have examined the patient and reviewed assessment and plan and discussed with patient.  Agree with above as stated.    Had significant hypotension prior to arrival.  Decrease entresto at this time. We discussed IMdur to treat microvascular disease, but will hold off since he has not been using NTG regularly.    Radial precautions given to the patient.  Will have him f/u with Dr. Claiborne Billings.  Aggressive secondary prevention including DM control, and regular exercise.   Larae Grooms

## 2020-03-02 NOTE — Progress Notes (Signed)
[facility] for Heparin Indication: atrial fibrillation  Allergies  Allergen Reactions  . Benazepril Other (See Comments)    hyperkalemia  . Fish Allergy Itching  . Omega-3 Fatty Acids Hives and Itching    Patient Measurements: Height: 6' (182.9 cm) Weight: 99.9 kg (220 lb 3.8 oz) IBW/kg (Calculated) : 77.6 Heparin Dosing Weight: 95 kg  Vital Signs: Temp: 98.1 F (36.7 C) (09/13 2341) Temp Source: Oral (09/13 2341) BP: 120/73 (09/13 2341) Pulse Rate: 57 (09/13 2341)  Labs: Recent Labs    02/28/20 1910 02/28/20 1910 02/28/20 2049 02/29/20 0626 02/29/20 1545 03/01/20 0307 03/01/20 0308 03/01/20 0849 03/01/20 1126 03/01/20 2251  HGB 17.7*  --   --   --   --  15.4  --   --   --   --   HCT 55.4*  --   --   --   --  49.2  --   --   --   --   PLT 182  --   --   --   --  148*  --   --   --   --   APTT 30   < >  --  31   < > 133*  --   --  80* 57*  LABPROT 13.7  --   --  15.3*  --   --   --   --   --   --   INR 1.1  --   --  1.3*  --   --   --   --   --   --   HEPARINUNFRC  --   --   --  >2.20*  --   --  1.07*  --   --   --   CREATININE 1.15  --   --   --   --   --   --   --   --   --   TROPONINIHS 38*  --  72*  --   --   --   --  115*  --   --    < > = values in this interval not displayed.    Estimated Creatinine Clearance: 72.1 mL/min (by C-G formula based on SCr of 1.15 mg/dL).   Assessment: 69 y.o. male admitted with chest pain, h/o Afib and Eliquis on hold, for heparin.  Last dose of Eliquis AM 9/11.  PTT down to 57 sec (subtherapeutic) on gtt at 1200 units/hr. No issues with line or bleeding reported per RN.  Goal of Therapy:  APTT 66-102 sec Heparin level 0.3-0.7 units/ml Monitor platelets by anticoagulation protocol: Yes    Plan:  Continue heparin infusion rate at 1400 units/hr F/u 8 hr PTT  Sherlon Handing, PharmD, BCPS Please see amion for complete clinical pharmacist phone list 03/02/2020,12:55 AM

## 2020-03-10 ENCOUNTER — Ambulatory Visit (INDEPENDENT_AMBULATORY_CARE_PROVIDER_SITE_OTHER): Payer: Medicare Other

## 2020-03-10 ENCOUNTER — Other Ambulatory Visit: Payer: Self-pay

## 2020-03-10 DIAGNOSIS — Z23 Encounter for immunization: Secondary | ICD-10-CM | POA: Diagnosis not present

## 2020-03-12 ENCOUNTER — Other Ambulatory Visit: Payer: Self-pay

## 2020-03-12 ENCOUNTER — Ambulatory Visit (INDEPENDENT_AMBULATORY_CARE_PROVIDER_SITE_OTHER): Payer: Medicare Other | Admitting: Cardiovascular Disease

## 2020-03-12 ENCOUNTER — Encounter: Payer: Self-pay | Admitting: Cardiovascular Disease

## 2020-03-12 VITALS — BP 130/84 | HR 53 | Temp 97.7°F | Ht 72.0 in | Wt 222.8 lb

## 2020-03-12 DIAGNOSIS — I712 Thoracic aortic aneurysm, without rupture, unspecified: Secondary | ICD-10-CM

## 2020-03-12 DIAGNOSIS — G4733 Obstructive sleep apnea (adult) (pediatric): Secondary | ICD-10-CM | POA: Diagnosis not present

## 2020-03-12 DIAGNOSIS — I10 Essential (primary) hypertension: Secondary | ICD-10-CM

## 2020-03-12 DIAGNOSIS — E119 Type 2 diabetes mellitus without complications: Secondary | ICD-10-CM

## 2020-03-12 DIAGNOSIS — I2510 Atherosclerotic heart disease of native coronary artery without angina pectoris: Secondary | ICD-10-CM

## 2020-03-12 DIAGNOSIS — E785 Hyperlipidemia, unspecified: Secondary | ICD-10-CM

## 2020-03-12 DIAGNOSIS — Z7901 Long term (current) use of anticoagulants: Secondary | ICD-10-CM

## 2020-03-12 DIAGNOSIS — Z9989 Dependence on other enabling machines and devices: Secondary | ICD-10-CM

## 2020-03-12 NOTE — Progress Notes (Signed)
Patient ID: John [name], male   DOB: 1951/06/06, 69 y.o.   MRN: [id number]     HPI:  John [name] is a 69 y.o. male who is a former patient of Dr. Rollene Fare. He presents for a 5 month cardiology followup evaluation.   John [name] has known CAD and underwent multiple percutaneous cardiac interventions with stenting to circumflex and RCA. His last cardiac catheterization was done by [name] in September 2013 showed his RCA stented patent but he had 40% narrowing in the very distal aspect. He has a history of hypertension, as well as long-standing tobacco use with mild polycythemia. I had seen him in a sleep clinic after he obtained a new CPAP machine.  He has severe sleep apnea with an AHI on his initial diagnostic study of 44 per hour overall and 72.6 per hour with REM sleep.  A download last year  showed excellent benefit from CPAP with an AHI of 3.1.  His initial stent to his RCA was in 1996 was a Cypher PS [id number] sent. In 1999 he underwent non-DES stenting to circumflex after he presented with a posterior MI. He had additional 4-5 stents placed in his RCA.  He has a history of prior back surgery and has had a left foot drop.  He has significant obstructive sleep apnea and continues to utilize CPAP therapy.  When I saw him in the past despite CPAP therapy he complained of fatigue and residual daytime sleepiness and I suggested the possibility of Nuvigil or Provigil if necessary.    Remotely he was seen  by [name] complaints of  atypical chest pain.  There was some concern that this may be reflux and pantoprazole was discontinued and replaced with dexilant. However, it was felt that the patient may require another stress test since his last evaluation was in 2012.  He also had had recent issues with some weight gain and shortness of breath and his intermittent Lasix dose changed to 40 mg daily.  On 05/06/2015 he underwent a nuclear stress test which was interpreted as a high risk study  due to an ejection fraction of 31%.  There was a large fixed inferior and inferoseptal wall defect suggestive of scar and evidence for inferoseptal akinesis.  There was no ischemia.   On his prior nuclear study the EF in October 2012 was 38%.  An echo Doppler study done the same day however, showed discordant data and his ejection fraction was 50-55%.  Hypokinesis of the inferior inferolateral wall was noted.  There was grade 1 diastolic dysfunction.  There was mild aortic root dilatation at 41 mm.  His left atrium was severely dilated.  His right atrium was severely dilated.  There was trivial TR.  Of note, his last echo Doppler study had shown an EF of 45-50%.  When I  saw him in November 2017  he stated that he has been using CPAP.  However, for the past month he has noticed significant increased fatigue.  He was also found to have elevated sugars and his insulin was adjusted.  He admits to development of some trace ankle swelling, left greater than right.  He denied any episodes of chest pain.  He was unaware of spells of tachycardia.  He denied presyncope or syncope.  During that evaluation, he was found to be in atrial flutter with variable block at 74 bpm of questionable duration.  At that time, I recommended that he discontinue Plavix and started him on eliquis  5 mg and further titrated his Bystolic to 10 mg.  His [id number] is at least 4 and with his CAD he was advised to continue aspirin 81 mg.  He underwent an echo Doppler study on 06/02/2016 which showed an EF of 40-45% with moderate diffuse hypokinesis.  While they are, mild aortic sclerosis and increased atrial septal thickness consistent with lipomatous hypertrophy.  I obtained a download of his CPAP unit from 02/09/2016 through 05/08/2016 and he was continuing to meet Medicare compliance with 91% of days of usage and 79% with usage greater than 4 hours.  He was averaging 5 hours and 55 minutes per night.  AHI was 3.7.  However, he had a very  large leak.  I have recommended that he get a new mask from his DME company.    He underwent successful cardioversion for atrial flutter.  He is unaware of any recurrent arrhythmia.  He continues to be on amiodarone, and Bystolic.  He is on eliquis for anticoagulation.  He has had difficulty with pain in the center of his back.  He has seen [name] and had undergone an MRI and was told of having disc disease.  He has noticed some shortness of breath.  He had undergone PFTs by [name] and was not found to have significant COPD or restrictive lung disease. John [name]  He continues to be on amiodarone.  He has been on [id number] mg  And bystolic for hypertension and rosuvastatin 20 mg for hyperlipidemia.  Unfortunately he is still smoking at least a half a pack per day and is smoked for over 55 years.  Continues to use CPAP with 100% compliance.  He is diabetic on insulin.  He is diabetic on insulin.  He is retired as a Chartered certified accountant for [employer]. He had experienced some back discomfort.  He denies any classic exertional chest tightness.    An echo Doppler study on 04/26/2017 which showed an EF of 40-45% with grade 1 diastolic dysfunction.  There was borderline aortic root dilatation.  PA pressure was 31 mm.  He had mild LA and mild to moderate RA .  A follow-up nuclear perfusion study on 04/27/2017 continued to be low risk and showed findings consistent with prior inferolateral myocardial infarction.  The EF on the nuclear study was less than the echo at 34%.   He has had issues with shortness of breath and has been under evaluation by [name].  Fortunately, he finally quit tobacco in February 2019.  He is felt to have interstitial lung disease. Pulmonary function studies have shown restriction with low DLCO.  He felt possibly also to have asthma based on nitric oxide testing.  With his lung disease it has been recommended that amiodarone be considered for discontinuance.  He has had issues with  cough.  His ACE inhibitor was ultimately changed by [name] and he was started on low-dose Entresto at 24/26 mm twice a day in light of his reduce LV function.   When It saw him in June 2019 due to his lung disease, I recommended discontinuance of amiodarone.  I recommended that if his heart rate increased to greater than 60 bpm should increase Bystolic to 7.5 mg from his current dose at 5 mg.  I had a long discussion with him regarding Delene Loll importance of therapy and recommended further titration to 49/51 mg twice a day.  He continues to use CPAP therapy mid to 100% compliance.  He has felt well with Entresto.  He is He is seen  by Dr. Abigail Miyamoto for pulmonary follow-up.   When I saw him in August 2019 and discussed further titration of Entresto to 97/103 mg twice a day but he preferred to stay on the 49/5100 regimen.  His blood pressure was stable.  He continue to use CPAP with 100% compliance.   I  saw him in December 2019.  At that time his shortness of breath was significantly improved which was contributed by his ultimate quitting of smoking in February 2019 as well as his discontinuance of amiodarone.  He denies PND orthopnea.  He denies recent wheezing.  However, his blood sugar has not been well controlled.  Recent hemoglobin A1c was 10.0.  He has been on insulin.  He continues to tolerate Entresto 49/51 mg twice a day in addition to Bystolic 5 mg.  He is on rosuvastatin 20 mg.  Laboratory has shown an LDL cholesterol excellent at 60.  He continues to be on Singulair and as needed albuterol.    On August 19, 2018 he underwent a follow-up echo Doppler study.  EF was now 50 to 55%.  There was moderate dilation of his left ventricle and evidence for mild inferior/inferolateral hypokinesis.  There was mild pulmonary hypertension.  He felt well   and denied chest pain, PND or orthopnea.   Since I saw him, he was evaluated by Loma Sousa, PA in August 2020 and continued to feel well without chest pain  or shortness of breath. A high resolution CT obtained in May 2020 revealed a new 1 cm pulmonary nodule in the left upper lobe and stable benign 6 mm pulmonary nodule in the right middle lobe.  He subsequently had follow-up with [name] September 2020 and in February 2021 was seen by Dr. Valeta Harms with stable respiratory symptoms.  I last saw him in April 2001 and since his prior evaluation he had felt weak and also noted some dizziness during a period of overexertion.. He has experienced exertional shortness of breath. He also admits to trace edema of his ankles. He denies chest pain PND orthopnea. He has been on Bystolic 5 mg daily, Entresto 49/51 mg twice daily, Jardiance 25 mg daily for his devious LV dysfunction. He denies recent wheezing.  Since I last saw him, he was hospitalized on September 11 through March 02, 2020 when he will was admitted with lightheadedness and low blood pressure.  He then developed left-sided chest pain with neck radiation, took nitroglycerin which further dropped his blood pressure improved his chest pain.  CTA of his chest showed aneurysmal dilatation of 4.1 cm of the ascending thoracic aorta.  His ECG showed new lateral precordial T wave inversion with his old inferior infarct.  He was mild troponin elevation with peak at 115.  He ultimately underwent repeat cardiac catheterization by Dr. Ellyn Hack and was found to have mild in-stent restenosis throughout the previously proximal to distal RCA stent with 50% lesions prior to and after the stents.  DFR was negative.  He had tandem 50 to 40% lesions in the ostial/proximal circumflex and DFR FFR were negative.  During his hospitalization his Entresto dose was reduced to 24/26.  Since his hospitalization he has felt well.  He denies any further lightheadedness or episodes of chest pain.  He has continued to be on low-dose aspirin and Eliquis.  He is now on Entresto 24/26 twice daily, nebivolol 2.5 mg daily.  He is on  rosuvastatin 20 mg for hyperlipidemia.  He is  on Jardiance and Levemir for his diabetes.  He presents for follow-up evaluation.  Past Medical History:  Diagnosis Date  . Atrial flutter ([place])    s/p cardioversion  . Coronary artery disease   . Diabetes mellitus   . GERD (gastroesophageal reflux disease)   . History of nuclear stress test 04/04/2011   lexiscan; mod-large in size fixed inferolateral defect (scar); non-diagnostic for ischemia; low risk scan   . Hyperlipidemia   . Hypertension   . Left foot drop    r/t past disk srugery - uses Kevlar brace  . Myocardial infarction ([place])    posterior MI  . Shortness of breath   . Sleep apnea    on CPAP; 04/28/2007 split-night - AHI during total sleep 44.43/hr and REM 72.56/hr    Past Surgical History:  Procedure Laterality Date  . BACK SURGERY  1985  . CARDIAC CATHETERIZATION  2010   6 stents total  . CARDIAC CATHETERIZATION  01/2000   percutaneous transluminal coronary balloon angioplasty of mid RCA stenotic lesion  . CARDIAC CATHETERIZATION  06/2006   no stenting; ischemic cardiomyopathy, EF 40-45%  . CARDIOVERSION N/A 07/28/2016   Procedure: CARDIOVERSION;  Surgeon: Troy Sine, MD;  Location: [facility];  Service: Cardiovascular;  Laterality: N/A;  . CORONARY ANGIOPLASTY  09/1998   mid-distal RCA balloon dilatation, 4.5 & 5.0 stents   . CORONARY ANGIOPLASTY WITH STENT PLACEMENT  03/1994   angioplasty & stenting (non-DES) of circumflex/prox ramus intermedius  . CORONARY ANGIOPLASTY WITH STENT PLACEMENT  10/1994   large iliac [id number] stent to RCA  . CORONARY ANGIOPLASTY WITH STENT PLACEMENT  12/2002   4.30m stents x2 of RCA  . CORONARY ANGIOPLASTY WITH STENT PLACEMENT  01/2005   cutting balloon arthrectomy of distal RCA & Cypher DES 3.5x13; cutting balloon arthrectomy of mid RCA with Cypher DES 3.5x18  . CORONARY ANGIOPLASTY WITH STENT PLACEMENT  11/2008   stenting of mid RCA with 4.0x111mdriver, non-DES  . INTRAVASCULAR PRESSURE  WIRE/FFR STUDY N/A 03/02/2020   Procedure: INTRAVASCULAR PRESSURE WIRE/FFR STUDY;  Surgeon: HaLeonie ManMD;  [facility];  Service: Cardiovascular;  Laterality: N/A;  . LEFT HEART CATH AND CORONARY ANGIOGRAPHY N/A 03/02/2020   Procedure: LEFT HEART CATH AND CORONARY ANGIOGRAPHY;  Surgeon: HaLeonie ManMD;  [facility];  Service: Cardiovascular;  Laterality: N/A;  . LEFT HEART CATHETERIZATION WITH CORONARY ANGIOGRAM N/A 02/27/2012   Procedure: LEFT HEART CATHETERIZATION WITH CORONARY ANGIOGRAM;  Surgeon: JoLorretta HarpMD;  [facility];  Service: Cardiovascular;  Laterality: N/A;  . TRANSTHORACIC ECHOCARDIOGRAM  07/29/2010   EF 50=55%, mod inf wall hypokinesis & mild post wall hypokinesis; LA mild-mod dilated; mild mitral annular calcif & mild MR; mild TR & elevated RV systolic pressure; AV mildly sclerotic; mild aortic root dilatation     Allergies  Allergen Reactions  . Benazepril Other (See Comments)    hyperkalemia  . Fish Allergy Itching  . Omega-3 Fatty Acids Hives and Itching    Current Outpatient Medications  Medication Sig Dispense Refill  . albuterol (PROVENTIL HFA;VENTOLIN HFA) 108 (90 Base) MCG/ACT inhaler Inhale 2 puffs into the lungs every 6 (six) hours as needed for wheezing or shortness of breath. 1 Inhaler 6  . aspirin 81 MG tablet Take 81 mg by mouth at bedtime.     . John KitchenLIQUIS 5 MG TABS tablet TAKE 1 TABLET BY MOUTH TWICE A DAY (Patient taking differently: Take 5 mg by mouth 2 (  two) times daily. ) 180 tablet 1  . Fluticasone Furoate (ARNUITY ELLIPTA) 200 MCG/ACT AEPB INHALE 1 PUFF BY MOUTH EVERY DAY (Patient taking differently: Inhale 1 puff into the lungs daily. ) 30 each 5  . insulin aspart (NOVOLOG FLEXPEN) 100 UNIT/ML FlexPen 5-20 units with lunch and dinner (Patient taking differently: Inject 5-6 Units into the skin 3 (three) times daily as needed for high blood sugar. ) 15 mL 1  . Insulin Detemir (LEVEMIR FLEXTOUCH)  100 UNIT/ML Pen INJECT 46 UNITS INTO THE SKIN DAILY. Dx:E11.9 (Patient taking differently: Inject 46 Units into the skin at bedtime. ) 30 mL 3  . Insulin Pen Needle (NOVOFINE) 32G X 6 MM MISC 1 each by Other route daily. 100 each 3  . JARDIANCE 25 MG TABS tablet TAKE 1 TABLET BY MOUTH EVERY DAY (Patient taking differently: Take 25 mg by mouth at bedtime. ) 30 tablet 5  . montelukast (SINGULAIR) 10 MG tablet TAKE 1 TABLET BY MOUTH EVERY DAY (Patient taking differently: Take 10 mg by mouth in the morning. ) 90 tablet 3  . nebivolol (BYSTOLIC) 2.5 MG tablet Take 1 tablet (2.5 mg total) by mouth daily. 90 tablet 3  . nitroGLYCERIN (NITROSTAT) 0.4 MG SL tablet PLACE 1 TABLET (0.4 MG TOTAL) UNDER THE TONGUE EVERY 5 (FIVE) MINUTES AS NEEDED FOR CHEST PAIN. 25 tablet 1  . ONETOUCH ULTRA test strip USE TO CHECK BLOOD SUGAR 3 TIMES A DAY (E11.9) 100 strip 2  . rosuvastatin (CRESTOR) 20 MG tablet Take 1 tablet (20 mg total) by mouth daily. (Patient taking differently: Take 20 mg by mouth every evening. ) 90 tablet 3  . sacubitril-valsartan (ENTRESTO) 24-26 MG Take 1 tablet by mouth 2 (two) times daily. 60 tablet 6   No current facility-administered medications for this visit.    Social History   Socioeconomic History  . Marital status: Married    Spouse name: Not on file  . Number of children: 3  . Years of education: Not on file  . Highest education level: Not on file  Occupational History  . 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 [place]  Tobacco Use  . Smoking status: Former Smoker    Packs/day: 1.00    Years: 50.00    Pack years: 50.00    Types: Cigarettes    Quit date: 07/20/2016    Years since quitting: 3.6  . Smokeless tobacco: Never Used  Substance and Sexual Activity  . Alcohol use: Yes    Alcohol/week: 0.0 standard drinks    Comment: occasionally  . Drug use: No  . Sexual activity: Yes  Other Topics Concern  . Not on file  Social History  Narrative  . Not on file   Social Determinants of Health   Financial Resource Strain:   . Difficulty of Paying Living Expenses: Not on file  Food Insecurity:   . Worried About Charity fundraiser in the Last Year: Not on file  . Ran Out of Food in the Last Year: Not on file  Transportation Needs:   . Lack of Transportation (Medical): Not on file  . Lack of Transportation (Non-Medical): Not on file  Physical Activity:   . Days of Exercise per Week: Not on file  . Minutes of Exercise per Session: Not on file  Stress:   . Feeling of Stress : Not on file  Social Connections:   . Frequency of Communication with Friends and Family:  Not on file  . Frequency of Social Gatherings with Friends and Family: Not on file  . Attends Religious Services: Not on file  . Active Member of Clubs or Organizations: Not on file  . Attends Archivist Meetings: Not on file  . Marital Status: Not on file  Intimate Partner Violence:   . Fear of Current or Ex-Partner: Not on file  . Emotionally Abused: Not on file  . Physically Abused: Not on file  . Sexually Abused: Not on file   Social history is known that he previously worked as a Freight forwarder for [employer]. There is a long-standing tobacco history. He rarely drinks alcohol.  Family History  Problem Relation Age of Onset  . Heart attack Father    ROS General: Negative; No fevers, chills, or night sweats; Positive for fatigue  HEENT: Positive for loss of hearing in his right ear Pulmonary: Had evaluation for possible interstitial lung disease; this of breath improved. History of lung nodules being followed by [name] and Dr. Valeta Harms Cardiovascular: see history of present illness Positive for possible claudication GI: Negative; No nausea, vomiting, diarrhea, or abdominal pain GU: Negative; No dysuria, hematuria, or difficulty voiding Musculoskeletal: Negative; no myalgias, joint pain, or weakness Hematologic/Oncology: Negative; no  easy bruising, bleeding Endocrine: Positive for diabetes mellitus Neuro: Negative; no changes in balance, headaches Skin: Negative; No rashes or skin lesions Psychiatric: Negative; No behavioral problems, depression Sleep: Positive for obstructive sleep apnea.  Mild residual daytime sleepiness; no bruxism, restless legs, hypnogognic hallucinations, no cataplexy Other comprehensive 14 point system review is negative.   PE BP 130/84   Pulse (!) 53   Temp 97.7 F (36.5 C)   Ht 6' (1.829 m)   Wt 222 lb 12.8 oz (101.1 kg)   SpO2 95%   BMI 30.22 kg/m    Repeat blood pressure 128/80  Wt Readings from Last 3 Encounters:  03/12/20 222 lb 12.8 oz (101.1 kg)  03/02/20 219 lb (99.3 kg)  02/19/20 219 lb 12.8 oz (99.7 kg)   General: Alert, oriented, no distress.  Skin: normal turgor, no rashes, warm and dry HEENT: Normocephalic, atraumatic. Pupils equal round and reactive to light; sclera anicteric; extraocular muscles intact; Nose without nasal septal hypertrophy Mouth/Parynx benign; Mallinpatti scale 3 Neck: No JVD, no carotid bruits; normal carotid upstroke Lungs: clear to ausculatation and percussion; no wheezing or rales Chest wall: without tenderness to palpitation Heart: PMI not displaced, RRR, s1 s2 normal, 1/6 systolic murmur, no diastolic murmur, no rubs, gallops, thrills, or heaves Abdomen: soft, nontender; no hepatosplenomehaly, BS+; abdominal aorta nontender and not dilated by palpation. Back: no CVA tenderness Pulses 2+ Musculoskeletal: full range of motion, normal strength, no joint deformities Extremities: no clubbing cyanosis or edema, Homan's sign negative  Neurologic: grossly nonfocal; Cranial nerves grossly wnl Psychologic: Normal mood and affect   ECG (independently read by me): Sinus bradycardia 53 bpm.  Inferior Q waves.  Lateral ST-T changes.  QTc interval 412 ms  April 2021 ECG (independently read by me): Sinus bradycardia 55 bpm.  Inferior Q waves consistent  with old inferior MI   August 29, 2018 ECG (independently read by me): Normal sinus rhythm at 63 bpm.  Old inferior infarct.  No ST segment changes.  Normal intervals.  December 2019 ECG (independently read by me): Sinus bradycardia  at 56; Old inferior MI  August 2019 ECG (independently read by me): Sinus bradycardia 50.  Old inferior infarct with Q waves fairly  June 2019 ECG (  independently read by me): Sinus bradycardia at 47 bpm.  Inferior Q waves.  QTc interval 472 ms.  No ectopy.  December 2018 ECG (independently read by me): Sinus bradycardia 53 bpm.  Q waves inferiorly, QTc interval 463 ms.  November 2017 ECG (independently read by me): Atrial fibrillation at 57 bpm.  Inferior Q waves.  An small inferolateral Q waves.  QTc interval 418 ms.  06/05/2016 ECG (independently read by me): Atrial flutter with a rate of 79 bpm with variable block.  QTc interval 467 ms.  November 2017 ECG (independently read by me): Atrial flutter with variable block, ventricular rate at 74.  LVH by voltage criteria in aVL.  Inferior Q waves concordant with prior MI.  May 2017 ECG (independently read by me): Sinus bradycardia at 51 bpm.  Old inferior infarct pattern with prominent inferior lateral Q waves  05/03/2015 ECG (independently read by me): Sinus bradycardia 54 bpm with PVC.  Inferior and lateral Q waves concordant with inferior lateral MI  ECG (independently read by me): Sinus bradycardia 55 bpm.  Inferior Q waves compatible with old inferior MI.  No significant ST segment changes.  January 2015 ECG (independently read by me): Normal sinus rhythm at 75 beats per minute. Old inferior infarction with inferior Q waves. No other ST-T changes  LABS:  BMP Latest Ref Rng & Units 02/28/2020 09/15/2019 02/26/2019  Glucose 70 - 99 mg/dL 168(H) 108(H) 115(H)  BUN 8 - 23 mg/dL _0 Creatinine 0.61 - 1.24 mg/dL 1.15 0.96 1.00  BUN/Creat Ratio 6 - 22 (calc) - NOT APPLICABLE NOT APPLICABLE  Sodium 993 - 145  mmol/L 143 142 146  Potassium 3.5 - 5.1 mmol/L 4.1 4.5 4.5  Chloride 98 - 111 mmol/L 107 108 111(H)  CO2 22 - 32 mmol/L _1 Calcium 8.9 - 10.3 mg/dL 9.6 9.2 9.4   Hepatic Function Latest Ref Rng & Units 02/28/2020 09/15/2019 02/26/2019  Total Protein 6.5 - 8.1 g/dL 6.8 5.8(L) 5.3(L)  Albumin 3.5 - 5.0 g/dL 4.1 - -  AST 15 - 41 U/L 32 14 16  ALT 0 - 44 U/L 38 20 19  Alk Phosphatase 38 - 126 U/L 67 - -  Total Bilirubin 0.3 - 1.2 mg/dL 0.7 0.7 0.6  Bilirubin, Direct - - - -    CBC Latest Ref Rng & Units 03/02/2020 03/01/2020 02/28/2020  WBC 4.0 - 10.5 K/uL 5.8 6.8 10.8(H)  Hemoglobin 13.0 - 17.0 g/dL 14.9 15.4 17.7(H)  Hematocrit 39 - 52 % 46.7 49.2 55.4(H)  Platelets 150 - 400 K/uL 138(L) 148(L) 182   Lab Results  Component Value Date   MCV 94.2 03/02/2020   MCV 94.6 03/01/2020   MCV 93.9 02/28/2020   Lab Results  Component Value Date   TSH 1.72 09/11/2017    BNP    Component Value Date/Time   PROBNP 411 (H) 02/14/2018 1431    Lipid Panel     Component Value Date/Time   CHOL 115 02/28/2020 1910   CHOL 115 01/24/2019 0930   TRIG 93 02/28/2020 1910   HDL 35 (L) 02/28/2020 1910   HDL 33 (L) 01/24/2019 0930   CHOLHDL 3.3 02/28/2020 1910   VLDL 19 02/28/2020 1910   LDLCALC 61 02/28/2020 1910   LDLCALC 50 09/15/2019 1538     RADIOLOGY: No results found.  IMPRESSION:  1. Coronary artery disease involving native coronary artery of native heart without angina pectoris   2. Essential hypertension   3. Anticoagulation  adequate   4. OSA on CPAP   5. Hyperlipidemia LDL goal <70   6. Controlled type 2 diabetes mellitus without complication, without long-term current use of insulin ([place])   7. Thoracic aortic aneurysm without rupture [facility])     ASSESSMENT AND PLAN: Mr. Mabile is a 69 year-old Caucasian male who has CAD and has undergone prior extensive stenting to his RCA and also stenting to his circumflex vesse dating back to 21.  An echo Doppler study in 2013  showed ejection fraction of 45-50%. There was severe inferior, inferolateral and inferoseptal hypokinesis in the basal inferolateral wall segments consistent with scar.  Cardiac catheterization by [name] was in September 2013. A nuclear perfusion study in November 2016 showed a large area of inferior scar without associated ischemia.  The ejection fraction on the nuclear study was reduced at 31%, making the scan, a high-risk study. His echo Doppler study done the same day showed discordant data with an EF of 50%, but also with inferior hypokinesis.  The echo Doppler study in November 2018 showed an EF of 40 to 45% with mild LVH, grade 1 diastolic dysfunction, and old inferior scar.  PA pressure was 31 mm.  He continued to do well without recurrent atrial flutter and remains off amiodarone.  At his last evaluation with me he was euvolemic on his heart failure regimen consisting of Entresto 49/51 mg twice daily, Jardiance and low-dose beta-blocker therapy.  His most recent echo has shown his EF had increased to 50 to 55% in March 2020.  I reviewed his most recent hospitalization which was precipitated by low blood pressure dropping into the 70s.  Cardiac catheterization data was reviewed.  He was found to have mild in-stent restenosis in his RCA as well as tandem 50 and 40% lesions in his circumflex.  DFR was negative.  He is doing well on his regimen now consisting of reduced Entresto and 24/26 in addition to his Jardiance 25 mg and nebivolol 2.5 mg daily.  He is not having any anginal symptoms.  He is now on rosuvastatin 20 mg.  LDL was excellent at 61 on February 28, 2020.  He continues to be on his pulmonary medications with albuterol and Arnuity Ellipta in addition to montelukast.  He continues to use CPAP therapy.  I will see him in 6 months for reevaluation   Troy Sine, MD, [facility]  03/12/2020 7:22 PM

## 2020-03-12 NOTE — Patient Instructions (Signed)
Follow-Up: At [facility], you and your health needs are our priority.  As part of our continuing mission to provide you with exceptional heart care, we have created designated Provider Care Teams.  These Care Teams include your primary Cardiologist (physician) and Advanced Practice Providers (APPs -  Physician Assistants and Nurse Practitioners) who all work together to provide you with the care you need, when you need it.  We recommend signing up for the patient portal called "MyChart".  Sign up information is provided on this After Visit Summary.  MyChart is used to connect with patients for Virtual Visits (Telemedicine).  Patients are able to view lab/test results, encounter notes, upcoming appointments, etc.  Non-urgent messages can be sent to your provider as well.   To learn more about what you can do with MyChart, go to [email or web address].    Your next appointment:   6 month(s)  The format for your next appointment:   In Person  Provider:   You may see Shelva Majestic, MD or one of the following Advanced Practice Providers on your designated Care Team:    Almyra Deforest, PA-C  Fabian Sharp, PA-C or   Roby Lofts, [place]

## 2020-04-12 ENCOUNTER — Telehealth: Payer: Medicare Other | Admitting: Cardiovascular Disease

## 2020-04-17 ENCOUNTER — Other Ambulatory Visit: Payer: Self-pay | Admitting: Family Medicine

## 2020-04-23 ENCOUNTER — Other Ambulatory Visit: Payer: Self-pay | Admitting: Family Medicine

## 2020-04-23 ENCOUNTER — Other Ambulatory Visit: Payer: Self-pay | Admitting: Cardiovascular Disease

## 2020-04-23 NOTE — Telephone Encounter (Signed)
Prescription refill request for Eliquis received. Indication: Atrial Flutter Last office visit: 02/2020  Claiborne Billings Scr: 1.15  02/2020 Age: 69 Weight: 101.1 kg  Prescription refilled

## 2020-04-26 DIAGNOSIS — L905 Scar conditions and fibrosis of skin: Secondary | ICD-10-CM | POA: Diagnosis not present

## 2020-04-26 DIAGNOSIS — D225 Melanocytic nevi of trunk: Secondary | ICD-10-CM | POA: Diagnosis not present

## 2020-04-26 DIAGNOSIS — L570 Actinic keratosis: Secondary | ICD-10-CM | POA: Diagnosis not present

## 2020-04-26 DIAGNOSIS — Z85828 Personal history of other malignant neoplasm of skin: Secondary | ICD-10-CM | POA: Diagnosis not present

## 2020-05-04 ENCOUNTER — Ambulatory Visit: Payer: Medicare Other | Admitting: Cardiovascular Disease

## 2020-05-21 ENCOUNTER — Ambulatory Visit (INDEPENDENT_AMBULATORY_CARE_PROVIDER_SITE_OTHER): Payer: Medicare Other | Admitting: Podiatry

## 2020-05-21 ENCOUNTER — Encounter: Payer: Self-pay | Admitting: Podiatry

## 2020-05-21 ENCOUNTER — Other Ambulatory Visit: Payer: Self-pay

## 2020-05-21 DIAGNOSIS — M79676 Pain in unspecified toe(s): Secondary | ICD-10-CM | POA: Diagnosis not present

## 2020-05-21 DIAGNOSIS — M779 Enthesopathy, unspecified: Secondary | ICD-10-CM

## 2020-05-21 DIAGNOSIS — B351 Tinea unguium: Secondary | ICD-10-CM

## 2020-05-21 DIAGNOSIS — I2510 Atherosclerotic heart disease of native coronary artery without angina pectoris: Secondary | ICD-10-CM | POA: Diagnosis not present

## 2020-05-21 DIAGNOSIS — E0843 Diabetes mellitus due to underlying condition with diabetic autonomic (poly)neuropathy: Secondary | ICD-10-CM

## 2020-05-21 DIAGNOSIS — Z23 Encounter for immunization: Secondary | ICD-10-CM | POA: Diagnosis not present

## 2020-05-21 NOTE — Progress Notes (Signed)
This patient returns to my office for at risk foot care.  This patient requires this care by a professional since this patient will be at risk due to having diabetes mellitus and coagulation defect.  Patient is taking eliquis.  This patient is unable to cut nails himself since the patient cannot reach his nails.These nails are painful walking and wearing shoes. He also says he is having pain on the bottom of his left foot where the has brace ends. This patient presents for at risk foot care today.  General Appearance  Alert, conversant and in no acute stress.  Vascular  Dorsalis pedis and posterior tibial  pulses are palpable  bilaterally.  Capillary return is within normal limits  bilaterally. Temperature is within normal limits  bilaterally.  Neurologic  Senn-Weinstein monofilament wire test within normal limits  bilaterally. Muscle power within normal limits bilaterally.  Nails Thick disfigured discolored nails with subungual debris  from hallux to fifth toes bilaterally. No evidence of bacterial infection or drainage bilaterally.  Orthopedic  No limitations of motion  feet .  No crepitus or effusions noted.  No bony pathology or digital deformities noted.  Skin  normotropic skin with no porokeratosis noted bilaterally.  No signs of infections or ulcers noted.     Onychomycosis  Pain in right toes  Pain in left toes  Capsilitis left foot.  Consent was obtained for treatment procedures.   Mechanical debridement of nails 1-5  bilaterally performed with a nail nipper.  Filed with dremel without incident.  Patient was dispensed pads sub 5th metabase left foot.  Patient was told to bring brace to [facility] for his evaluation and possible padding.   Return office visit  3 months                   Told patient to return for periodic foot care and evaluation due to potential at risk complications.   Gardiner Barefoot DPM

## 2020-06-02 ENCOUNTER — Other Ambulatory Visit: Payer: Self-pay | Admitting: Family Medicine

## 2020-06-04 ENCOUNTER — Other Ambulatory Visit: Payer: Medicare Other | Admitting: Orthotics

## 2020-06-14 DIAGNOSIS — J209 Acute bronchitis, unspecified: Secondary | ICD-10-CM

## 2020-06-14 MED ORDER — ALBUTEROL SULFATE HFA 108 (90 BASE) MCG/ACT IN AERS: 2.0000 | INHALATION_SPRAY | Freq: Four times a day (QID) | RESPIRATORY_TRACT | 6 refills | Status: DC | PRN

## 2020-06-15 ENCOUNTER — Other Ambulatory Visit: Payer: Self-pay | Admitting: Family Medicine

## 2020-06-18 ENCOUNTER — Other Ambulatory Visit: Payer: Self-pay | Admitting: Cardiovascular Disease

## 2020-07-16 ENCOUNTER — Other Ambulatory Visit: Payer: Self-pay | Admitting: Family Medicine

## 2020-07-21 ENCOUNTER — Other Ambulatory Visit: Payer: Self-pay | Admitting: Pulmonary Disease

## 2020-07-21 MED ORDER — ARNUITY ELLIPTA 200 MCG/ACT IN AEPB: 1.0000 | INHALATION_SPRAY | Freq: Every day | RESPIRATORY_TRACT | 3 refills | Status: DC

## 2020-07-26 ENCOUNTER — Ambulatory Visit (HOSPITAL_COMMUNITY)
Admission: RE | Admit: 2020-07-26 | Discharge: 2020-07-26 | Disposition: A | Discharge status: Home / Self Care | Payer: Medicare Other | Source: Ambulatory Visit | Attending: Pulmonary Disease | Admitting: Pulmonary Disease

## 2020-07-26 ENCOUNTER — Other Ambulatory Visit: Payer: Self-pay

## 2020-07-26 DIAGNOSIS — R911 Solitary pulmonary nodule: Secondary | ICD-10-CM

## 2020-07-26 DIAGNOSIS — I712 Thoracic aortic aneurysm, without rupture: Secondary | ICD-10-CM | POA: Diagnosis not present

## 2020-07-26 DIAGNOSIS — I700 Atherosclerosis of aorta: Secondary | ICD-10-CM | POA: Diagnosis not present

## 2020-07-26 DIAGNOSIS — R918 Other nonspecific abnormal finding of lung field: Secondary | ICD-10-CM | POA: Diagnosis not present

## 2020-07-26 DIAGNOSIS — I2510 Atherosclerotic heart disease of native coronary artery without angina pectoris: Secondary | ICD-10-CM | POA: Diagnosis not present

## 2020-07-27 ENCOUNTER — Other Ambulatory Visit: Payer: Self-pay | Admitting: Cardiovascular Disease

## 2020-07-29 DIAGNOSIS — R490 Dysphonia: Secondary | ICD-10-CM | POA: Diagnosis not present

## 2020-07-29 DIAGNOSIS — J383 Other diseases of vocal cords: Secondary | ICD-10-CM | POA: Diagnosis not present

## 2020-08-02 ENCOUNTER — Ambulatory Visit (INDEPENDENT_AMBULATORY_CARE_PROVIDER_SITE_OTHER): Payer: Medicare Other | Admitting: Pulmonary Disease

## 2020-08-02 ENCOUNTER — Other Ambulatory Visit: Payer: Self-pay

## 2020-08-02 ENCOUNTER — Encounter: Payer: Self-pay | Admitting: Pulmonary Disease

## 2020-08-02 VITALS — BP 132/80 | HR 59 | Temp 98.3°F | Ht 72.0 in | Wt 223.2 lb

## 2020-08-02 DIAGNOSIS — J418 Mixed simple and mucopurulent chronic bronchitis: Secondary | ICD-10-CM | POA: Diagnosis not present

## 2020-08-02 DIAGNOSIS — R911 Solitary pulmonary nodule: Secondary | ICD-10-CM | POA: Diagnosis not present

## 2020-08-02 DIAGNOSIS — Z87891 Personal history of nicotine dependence: Secondary | ICD-10-CM

## 2020-08-02 NOTE — Patient Instructions (Signed)
Thank you for visiting Dr. Valeta Harms at [facility]. Today we recommend the following:  Orders Placed This Encounter  Procedures  . Ambulatory Referral for Lung Cancer Scre   Enrollment in lung cancer screening CT Feb 2023.   Return in about 1 year (around 08/02/2021) for with APP or Dr. Valeta Harms.    Please do your part to reduce the spread of COVID-19.

## 2020-08-02 NOTE — Progress Notes (Signed)
Synopsis: Referred in November 2020 for nodule evaluation by [name], MD  Subjective:   PATIENT ID: John [name] GENDER: male DOB: [birth year], MRN: [id number]  Chief Complaint  Patient presents with  . Follow-up    Here to review the CT scan that was done on 2/7.  Pt stated that he has had to use the rescue inhaler more often    This is a 70 year old gentleman 54-pack-year history of smoking, underwent CT scan imaging which revealed a left upper lobe lung nodule followed by [name].  Patient ultimately underwent PET scan imaging in September which revealed low-level PET uptake, SUV 1.4 within the nodule.  And continued follow-up proceeded.  Patient had a repeat super D noncontrasted imaging in November 2020 and referred to see me for evaluation of lung nodule.  Today in the office we discussed the patient's lung nodule imaging and reviewed the images back from the initial pictures in May 2020 that first discover the nodule.  He also has a stable contralateral nodule.  This nodule has been present since 2014.  As for the left upper lobe nodule it has started to evolve a little bit went from more solid to a subsolid form.  Otherwise the patient has no significant symptoms and feels as if his dyspnea is well controlled.  Patient denies fevers chills night sweats weight loss or hemoptysis. Retired from [employer] and spatial [employer], Chartered certified accountant for [employer].   OV 08/13/2019: Here today for CT scan follow-up of lung nodule.  Patient's respiratory symptoms are stable.  He has been using his Arnuity inhaler as well as albuterol as needed.  Has not needed this recently.  Stable use of Arnuity and rinsing of mouth in the morning.  Is able to work outside in his yard.  Working on some brush piles to help clean up the yard for the springtime.  Does get some shortness of breath with significant exertion but overall states he is pretty stable.  He did review his results in my chart for his lung  nodule follow-up and is happy about the fact that the document stability.  Patient denies hemoptysis weight loss fevers chills.  OV 08/02/2020: Here today for CT scan follow-up of lung nodule.  He has had this for several years originally followed by [name].  Has COPD with chronic bronchitis type symptoms.  Currently managed with Arnuity plus as needed albuterol.  Does not need refills of inhalers at this time.  He had a recent CT scan of the chest in July 26, 2020.  This follow-up CT shows a stable thoracic aneurysm at 4.2 cm.  He has small bilateral pulmonary nodules.  The left upper lobe nodule is stable in comparison to follow-up images.  He is a former smoker with a significant greater than 50+ pack year history.  We discussed enrollment in lung cancer screening moving forward.   Past Medical History:  Diagnosis Date  . Atrial flutter ([place])    s/p cardioversion  . Coronary artery disease   . Diabetes mellitus   . GERD (gastroesophageal reflux disease)   . History of nuclear stress test 04/04/2011   lexiscan; mod-large in size fixed inferolateral defect (scar); non-diagnostic for ischemia; low risk scan   . Hyperlipidemia   . Hypertension   . Left foot drop    r/t past disk srugery - uses Kevlar brace  . Myocardial infarction ([place])    posterior MI  . Shortness of breath   . Sleep apnea  on CPAP; 04/28/2007 split-night - AHI during total sleep 44.43/hr and REM 72.56/hr     Family History  Problem Relation Age of Onset  . Heart attack Father      Past Surgical History:  Procedure Laterality Date  . BACK SURGERY  1985  . CARDIAC CATHETERIZATION  2010   6 stents total  . CARDIAC CATHETERIZATION  01/2000   percutaneous transluminal coronary balloon angioplasty of mid RCA stenotic lesion  . CARDIAC CATHETERIZATION  06/2006   no stenting; ischemic cardiomyopathy, EF 40-45%  . CARDIOVERSION N/A 07/28/2016   Procedure: CARDIOVERSION;  Surgeon: Troy Sine, MD;  Location:  [facility];  Service: Cardiovascular;  Laterality: N/A;  . CORONARY ANGIOPLASTY  09/1998   mid-distal RCA balloon dilatation, 4.5 & 5.0 stents   . CORONARY ANGIOPLASTY WITH STENT PLACEMENT  03/1994   angioplasty & stenting (non-DES) of circumflex/prox ramus intermedius  . CORONARY ANGIOPLASTY WITH STENT PLACEMENT  10/1994   large iliac [id number] stent to RCA  . CORONARY ANGIOPLASTY WITH STENT PLACEMENT  12/2002   4.88mm stents x2 of RCA  . CORONARY ANGIOPLASTY WITH STENT PLACEMENT  01/2005   cutting balloon arthrectomy of distal RCA & Cypher DES 3.5x13; cutting balloon arthrectomy of mid RCA with Cypher DES 3.5x18  . CORONARY ANGIOPLASTY WITH STENT PLACEMENT  11/2008   stenting of mid RCA with 4.0x37mm driver, non-DES  . INTRAVASCULAR PRESSURE WIRE/FFR STUDY N/A 03/02/2020   Procedure: INTRAVASCULAR PRESSURE WIRE/FFR STUDY;  Surgeon: Leonie Man, MD;  Location: [facility];  Service: Cardiovascular;  Laterality: N/A;  . LEFT HEART CATH AND CORONARY ANGIOGRAPHY N/A 03/02/2020   Procedure: LEFT HEART CATH AND CORONARY ANGIOGRAPHY;  Surgeon: Leonie Man, MD;  Location: [facility];  Service: Cardiovascular;  Laterality: N/A;  . LEFT HEART CATHETERIZATION WITH CORONARY ANGIOGRAM N/A 02/27/2012   Procedure: LEFT HEART CATHETERIZATION WITH CORONARY ANGIOGRAM;  Surgeon: Lorretta Harp, MD;  Location: [facility];  Service: Cardiovascular;  Laterality: N/A;  . TRANSTHORACIC ECHOCARDIOGRAM  07/29/2010   EF 50=55%, mod inf wall hypokinesis & mild post wall hypokinesis; LA mild-mod dilated; mild mitral annular calcif & mild MR; mild TR & elevated RV systolic pressure; AV mildly sclerotic; mild aortic root dilatation     Social History   Socioeconomic History  . Marital status: Married    Spouse name: Not on file  . Number of children: 3  . Years of education: Not on file  . Highest education level: Not on file  Occupational History  . 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, [place]. [employer]  Tobacco Use  . Smoking status: Former Smoker    Packs/day: 1.00    Years: 50.00    Pack years: 50.00    Types: Cigarettes    Quit date: 07/20/2016    Years since quitting: 4.0  . Smokeless tobacco: Never Used  Substance and Sexual Activity  . Alcohol use: Yes    Alcohol/week: 0.0 standard drinks    Comment: occasionally  . Drug use: No  . Sexual activity: Yes  Other Topics Concern  . Not on file  Social History Narrative  . Not on file   Social Determinants of Health   Financial Resource Strain: Not on file  Food Insecurity: Not on file  Transportation Needs: Not on file  Physical Activity: Not on file  Stress: Not on file  Social Connections: Not on file  Intimate Partner Violence: Not  on file     Allergies  Allergen Reactions  . Benazepril Other (See Comments)    hyperkalemia  . Fish Allergy Itching  . Omega-3 Fatty Acids Hives and Itching     Outpatient Medications Prior to Visit  Medication Sig Dispense Refill  . albuterol (VENTOLIN HFA) 108 (90 Base) MCG/ACT inhaler Inhale 2 puffs into the lungs every 6 (six) hours as needed for wheezing or shortness of breath. 1 each 6  . aspirin 81 MG tablet Take 81 mg by mouth at bedtime.     [name] ELIQUIS 5 MG TABS tablet TAKE 1 TABLET BY MOUTH TWICE A DAY 60 tablet 5  . Fluticasone Furoate (ARNUITY ELLIPTA) 200 MCG/ACT AEPB Inhale 1 puff into the lungs daily. 1 each 3  . insulin aspart (NOVOLOG FLEXPEN) 100 UNIT/ML FlexPen INJECT 5-20 UNITS SUBCUTANEOUSLY WITH LUNCH AND DINNER 15 mL 1  . Insulin Pen Needle (NOVOFINE) 32G X 6 MM MISC 1 each by Other route daily. 100 each 3  . JARDIANCE 25 MG TABS tablet TAKE 1 TABLET BY MOUTH EVERY DAY 30 tablet 5  . LEVEMIR FLEXTOUCH 100 UNIT/ML FlexPen INJECT 46 UNITS INTO THE SKIN DAILY. DX:E11.9 15 mL 3  . montelukast (SINGULAIR) 10 MG tablet TAKE 1 TABLET BY MOUTH EVERY DAY (Patient taking differently: Take 10 mg by mouth in the morning.) 90 tablet 3   . nebivolol (BYSTOLIC) 2.5 MG tablet Take 1 tablet (2.5 mg total) by mouth daily. 90 tablet 3  . nitroGLYCERIN (NITROSTAT) 0.4 MG SL tablet PLACE 1 TABLET (0.4 MG TOTAL) UNDER THE TONGUE EVERY 5 (FIVE) MINUTES AS NEEDED FOR CHEST PAIN. 25 tablet 1  . ONETOUCH ULTRA test strip USE TO CHECK BLOOD SUGAR 3 TIMES A DAY (E11.9) 100 strip 2  . rosuvastatin (CRESTOR) 20 MG tablet TAKE 1 TABLET BY MOUTH EVERY DAY 90 tablet 3  . sacubitril-valsartan (ENTRESTO) 24-26 MG Take 1 tablet by mouth 2 (two) times daily. 60 tablet 6   No facility-administered medications prior to visit.    Review of Systems  Constitutional: Negative for chills, fever, malaise/fatigue and weight loss.  HENT: Negative for hearing loss, sore throat and tinnitus.   Eyes: Negative for blurred vision and double vision.  Respiratory: Negative for cough, hemoptysis, sputum production, shortness of breath, wheezing and stridor.   Cardiovascular: Negative for chest pain, palpitations, orthopnea, leg swelling and PND.  Gastrointestinal: Negative for abdominal pain, constipation, diarrhea, heartburn, nausea and vomiting.  Genitourinary: Negative for dysuria, hematuria and urgency.  Musculoskeletal: Negative for joint pain and myalgias.  Skin: Negative for itching and rash.  Neurological: Negative for dizziness, tingling, weakness and headaches.  Endo/Heme/Allergies: Negative for environmental allergies. Does not bruise/bleed easily.  Psychiatric/Behavioral: Negative for depression. The patient is not nervous/anxious and does not have insomnia.   All other systems reviewed and are negative.    Objective:  Physical Exam Vitals reviewed.  Constitutional:      General: He is not in acute distress.    Appearance: He is well-developed and well-nourished. He is obese.  HENT:     Head: Normocephalic and atraumatic.     Mouth/Throat:     Mouth: Oropharynx is clear and moist.  Eyes:     General: No scleral icterus.     Conjunctiva/sclera: Conjunctivae normal.     Pupils: Pupils are equal, round, and reactive to light.  Neck:     Vascular: No JVD.     Trachea: No tracheal deviation.  Cardiovascular:     Rate  and Rhythm: Normal rate and regular rhythm.     Pulses: Intact distal pulses.     Heart sounds: Normal heart sounds. No murmur heard.   Pulmonary:     Effort: Pulmonary effort is normal. No tachypnea, accessory muscle usage or respiratory distress.     Breath sounds: Normal breath sounds. No stridor. No wheezing, rhonchi or rales.  Abdominal:     General: Bowel sounds are normal. There is no distension.     Palpations: Abdomen is soft.     Tenderness: There is no abdominal tenderness.  Musculoskeletal:        General: No tenderness or edema.     Cervical back: Neck supple.  Lymphadenopathy:     Cervical: No cervical adenopathy.  Skin:    General: Skin is warm and dry.     Capillary Refill: Capillary refill takes less than 2 seconds.     Findings: No rash.  Neurological:     Mental Status: He is alert and oriented to person, place, and time.  Psychiatric:        Mood and Affect: Mood and affect normal.        Behavior: Behavior normal.      Vitals:   08/02/20 1004  BP: 132/80  Pulse: (!) 59  Temp: 98.3 F (36.8 C)  TempSrc: Tympanic  SpO2: 97%  Weight: 223 lb 4 oz (101.3 kg)  Height: 6' (1.829 m)   97% on RA BMI Readings from Last 3 Encounters:  08/02/20 30.28 kg/m  03/12/20 30.22 kg/m  03/02/20 29.70 kg/m   Wt Readings from Last 3 Encounters:  08/02/20 223 lb 4 oz (101.3 kg)  03/12/20 222 lb 12.8 oz (101.1 kg)  03/02/20 219 lb (99.3 kg)     CBC    Component Value Date/Time   WBC 5.8 03/02/2020 0545   RBC 4.96 03/02/2020 0545   HGB 14.9 03/02/2020 0545   HGB 16.6 04/19/2017 1030   HCT 46.7 03/02/2020 0545   HCT 48.1 04/19/2017 1030   PLT 138 (L) 03/02/2020 0545   PLT 182 04/19/2017 1030   MCV 94.2 03/02/2020 0545   MCV 90 04/19/2017 1030   MCH 30.0  03/02/2020 0545   MCHC 31.9 03/02/2020 0545   RDW 13.6 03/02/2020 0545   RDW 13.8 04/19/2017 1030   LYMPHSABS 1.7 02/28/2020 1910   MONOABS 0.6 02/28/2020 1910   EOSABS 0.1 02/28/2020 1910   BASOSABS 0.1 02/28/2020 1910     Chest Imaging: 11/07/2018: CT chest imaging Initial imaging showed a 1 cm pulmonary nodule left upper lobe concerning for malignancy.  Patient ultimately sent forNuclear medicine imaging.  03/12/2019: Nuclear medicine pet imaging Lingular nodule with no hypermetabolism.  However CT morphology persistent stable in size but favors a small adenocarcinoma.  Per nuclear medicine report.  05/07/2019 super D CT imaging: CT imaging revealed feels a left upper lobe rounded pulmonary nodule that has now began to evolve some.  There is clusters of nodules in this location.  After review of these images in comparison to previous is less likely a malignancy however it may be caught in evolution as it starts to lobulated into a larger mass.  This was discussed with the patient.The patient's images have been independently reviewed by me.    07/29/2019 super D CT imaging: Patient with a stable 1.5 cm subsolid left upper lobe nodule.  This was previously solid on comparison and therefore has some degenerative-like changes.  It is still possible that we are dealing with  a malignancy however would recommend continued follow-up.. The patient's images have been independently reviewed by me.    07/26/2020: CT chest: Stable left upper lobe subsolid lung nodule.  Other small pulmonary nodules. The patient's images have been independently reviewed by me.    Pulmonary Functions Testing Results: PFT Results Latest Ref Rng & Units 03/05/2019 03/14/2018 11/29/2017  FVC-Pre L 4.27 4.29 3.88  FVC-Predicted Pre % 86 86 77  FVC-Post L - - 3.82  FVC-Predicted Post % - - 76  Pre FEV1/FVC % % 80 81 84  Post FEV1/FCV % % - - 85  FEV1-Pre L 3.42 3.48 3.25  FEV1-Predicted Pre % 93 94 88  FEV1-Post L - -  3.26  DLCO uncorrected ml/min/mmHg 26.65 19.62 19.16  DLCO UNC% % 93 53 52  DLVA Predicted % 94 64 65    FeNO: none   Pathology: none   Echocardiogram:   1. The left ventricle has low normal systolic function, with an ejection fraction of 50-55%. The cavity size was moderately dilated. Left ventricular diastolic Doppler parameters are consistent with impaired relaxation.  2. The right ventricle has normal systolic function. The cavity was normal. There is no increase in right ventricular wall thickness.  3. The mitral valve is normal in structure.  4. The tricuspid valve is normal in structure.  5. The aortic valve is tricuspid Mild thickening of the aortic valve.  6. The pulmonic valve was normal in structure. Pulmonic valve regurgitation is mild by color flow Doppler.  7. LVEF is approximately 50 to 55% with basal inferior/inferolateral hypokinesis.  Heart Catheterization: none     Assessment & Plan:     ICD-10-CM   1. Lung nodule  R91.1   2. Former smoker  Z87.891   3. Mixed simple and mucopurulent chronic bronchitis (HCC)  J41.8     Discussion:  This is a 70 year old gentleman, 54-pack-year history of smoking, quit 2018.  History of asthma as a child.  Pulmonary function tests with reassuring spirometry.  However does have chronic bronchitis type symptoms.  Managed with Arnuity and as needed albuterol.  Plan: Recommend annual lung cancer screening. This can start in February 2023. As for the stable nodule in the left upper lobe we can continue to follow this with annual lung cancer screening CTs.  No additional need for every 6 month follow-ups. Continue Arnuity for chronic bronchitis type symptoms Continue as needed albuterol.  Patient to follow-up with [place] in 1 year and enroll in lung cancer screening program February 2023.    Current Outpatient Medications:  .  albuterol (VENTOLIN HFA) 108 (90 Base) MCG/ACT inhaler, Inhale 2 puffs into the lungs every 6 (six) hours  as needed for wheezing or shortness of breath., Disp: 1 each, Rfl: 6 .  aspirin 81 MG tablet, Take 81 mg by mouth at bedtime. , Disp: , Rfl:  .  ELIQUIS 5 MG TABS tablet, TAKE 1 TABLET BY MOUTH TWICE A DAY, Disp: 60 tablet, Rfl: 5 .  Fluticasone Furoate (ARNUITY ELLIPTA) 200 MCG/ACT AEPB, Inhale 1 puff into the lungs daily., Disp: 1 each, Rfl: 3 .  insulin aspart (NOVOLOG FLEXPEN) 100 UNIT/ML FlexPen, INJECT 5-20 UNITS SUBCUTANEOUSLY WITH LUNCH AND DINNER, Disp: 15 mL, Rfl: 1 .  Insulin Pen Needle (NOVOFINE) 32G X 6 MM MISC, 1 each by Other route daily., Disp: 100 each, Rfl: 3 .  JARDIANCE 25 MG TABS tablet, TAKE 1 TABLET BY MOUTH EVERY DAY, Disp: 30 tablet, Rfl: 5 .  LEVEMIR FLEXTOUCH  100 UNIT/ML FlexPen, INJECT 46 UNITS INTO THE SKIN DAILY. DX:E11.9, Disp: 15 mL, Rfl: 3 .  montelukast (SINGULAIR) 10 MG tablet, TAKE 1 TABLET BY MOUTH EVERY DAY (Patient taking differently: Take 10 mg by mouth in the morning.), Disp: 90 tablet, Rfl: 3 .  nebivolol (BYSTOLIC) 2.5 MG tablet, Take 1 tablet (2.5 mg total) by mouth daily., Disp: 90 tablet, Rfl: 3 .  nitroGLYCERIN (NITROSTAT) 0.4 MG SL tablet, PLACE 1 TABLET (0.4 MG TOTAL) UNDER THE TONGUE EVERY 5 (FIVE) MINUTES AS NEEDED FOR CHEST PAIN., Disp: 25 tablet, Rfl: 1 .  ONETOUCH ULTRA test strip, USE TO CHECK BLOOD SUGAR 3 TIMES A DAY (E11.9), Disp: 100 strip, Rfl: 2 .  rosuvastatin (CRESTOR) 20 MG tablet, TAKE 1 TABLET BY MOUTH EVERY DAY, Disp: 90 tablet, Rfl: 3 .  sacubitril-valsartan (ENTRESTO) 24-26 MG, Take 1 tablet by mouth 2 (two) times daily., Disp: 60 tablet, Rfl: 6   Garner Nash, DO [facility] 08/02/2020 10:10 AM

## 2020-08-18 ENCOUNTER — Other Ambulatory Visit: Payer: Self-pay

## 2020-08-18 ENCOUNTER — Inpatient Hospital Stay (HOSPITAL_COMMUNITY)
Admission: EM | Admit: 2020-08-18 | Discharge: 2020-08-21 | DRG: 223 | Disposition: A | Discharge status: Home / Self Care | Payer: Medicare Other | Attending: Cardiology | Admitting: Cardiology

## 2020-08-18 ENCOUNTER — Encounter (HOSPITAL_COMMUNITY): Payer: Self-pay

## 2020-08-18 DIAGNOSIS — E785 Hyperlipidemia, unspecified: Secondary | ICD-10-CM | POA: Diagnosis present

## 2020-08-18 DIAGNOSIS — Z888 Allergy status to other drugs, medicaments and biological substances status: Secondary | ICD-10-CM

## 2020-08-18 DIAGNOSIS — Z6830 Body mass index (BMI) 30.0-30.9, adult: Secondary | ICD-10-CM

## 2020-08-18 DIAGNOSIS — M21372 Foot drop, left foot: Secondary | ICD-10-CM | POA: Diagnosis present

## 2020-08-18 DIAGNOSIS — Z7901 Long term (current) use of anticoagulants: Secondary | ICD-10-CM | POA: Diagnosis not present

## 2020-08-18 DIAGNOSIS — I472 Ventricular tachycardia, unspecified: Secondary | ICD-10-CM

## 2020-08-18 DIAGNOSIS — Z955 Presence of coronary angioplasty implant and graft: Secondary | ICD-10-CM

## 2020-08-18 DIAGNOSIS — I110 Hypertensive heart disease with heart failure: Secondary | ICD-10-CM | POA: Diagnosis present

## 2020-08-18 DIAGNOSIS — E119 Type 2 diabetes mellitus without complications: Secondary | ICD-10-CM | POA: Diagnosis present

## 2020-08-18 DIAGNOSIS — Z20822 Contact with and (suspected) exposure to covid-19: Secondary | ICD-10-CM | POA: Diagnosis present

## 2020-08-18 DIAGNOSIS — I2111 ST elevation (STEMI) myocardial infarction involving right coronary artery: Secondary | ICD-10-CM

## 2020-08-18 DIAGNOSIS — I959 Hypotension, unspecified: Secondary | ICD-10-CM | POA: Diagnosis not present

## 2020-08-18 DIAGNOSIS — Z95818 Presence of other cardiac implants and grafts: Secondary | ICD-10-CM

## 2020-08-18 DIAGNOSIS — I200 Unstable angina: Secondary | ICD-10-CM | POA: Diagnosis not present

## 2020-08-18 DIAGNOSIS — I493 Ventricular premature depolarization: Secondary | ICD-10-CM | POA: Diagnosis not present

## 2020-08-18 DIAGNOSIS — Z7984 Long term (current) use of oral hypoglycemic drugs: Secondary | ICD-10-CM

## 2020-08-18 DIAGNOSIS — Z7982 Long term (current) use of aspirin: Secondary | ICD-10-CM

## 2020-08-18 DIAGNOSIS — Z91013 Allergy to seafood: Secondary | ICD-10-CM

## 2020-08-18 DIAGNOSIS — G4733 Obstructive sleep apnea (adult) (pediatric): Secondary | ICD-10-CM | POA: Diagnosis present

## 2020-08-18 DIAGNOSIS — K219 Gastro-esophageal reflux disease without esophagitis: Secondary | ICD-10-CM | POA: Diagnosis present

## 2020-08-18 DIAGNOSIS — Z8249 Family history of ischemic heart disease and other diseases of the circulatory system: Secondary | ICD-10-CM

## 2020-08-18 DIAGNOSIS — I2510 Atherosclerotic heart disease of native coronary artery without angina pectoris: Secondary | ICD-10-CM

## 2020-08-18 DIAGNOSIS — R0789 Other chest pain: Secondary | ICD-10-CM | POA: Diagnosis not present

## 2020-08-18 DIAGNOSIS — Z79899 Other long term (current) drug therapy: Secondary | ICD-10-CM

## 2020-08-18 DIAGNOSIS — I4892 Unspecified atrial flutter: Secondary | ICD-10-CM | POA: Diagnosis present

## 2020-08-18 DIAGNOSIS — Z7951 Long term (current) use of inhaled steroids: Secondary | ICD-10-CM

## 2020-08-18 DIAGNOSIS — E7800 Pure hypercholesterolemia, unspecified: Secondary | ICD-10-CM | POA: Diagnosis not present

## 2020-08-18 DIAGNOSIS — I252 Old myocardial infarction: Secondary | ICD-10-CM | POA: Diagnosis not present

## 2020-08-18 DIAGNOSIS — R079 Chest pain, unspecified: Secondary | ICD-10-CM | POA: Diagnosis not present

## 2020-08-18 DIAGNOSIS — I509 Heart failure, unspecified: Secondary | ICD-10-CM | POA: Diagnosis present

## 2020-08-18 DIAGNOSIS — I214 Non-ST elevation (NSTEMI) myocardial infarction: Secondary | ICD-10-CM | POA: Diagnosis not present

## 2020-08-18 DIAGNOSIS — I255 Ischemic cardiomyopathy: Secondary | ICD-10-CM

## 2020-08-18 DIAGNOSIS — E669 Obesity, unspecified: Secondary | ICD-10-CM | POA: Diagnosis present

## 2020-08-18 DIAGNOSIS — I10 Essential (primary) hypertension: Secondary | ICD-10-CM | POA: Diagnosis present

## 2020-08-18 DIAGNOSIS — E1143 Type 2 diabetes mellitus with diabetic autonomic (poly)neuropathy: Secondary | ICD-10-CM

## 2020-08-18 DIAGNOSIS — I2583 Coronary atherosclerosis due to lipid rich plaque: Secondary | ICD-10-CM | POA: Diagnosis not present

## 2020-08-18 DIAGNOSIS — J849 Interstitial pulmonary disease, unspecified: Secondary | ICD-10-CM | POA: Diagnosis present

## 2020-08-18 DIAGNOSIS — Z794 Long term (current) use of insulin: Secondary | ICD-10-CM

## 2020-08-18 DIAGNOSIS — J9811 Atelectasis: Secondary | ICD-10-CM | POA: Diagnosis not present

## 2020-08-18 DIAGNOSIS — Z87891 Personal history of nicotine dependence: Secondary | ICD-10-CM

## 2020-08-18 DIAGNOSIS — I480 Paroxysmal atrial fibrillation: Secondary | ICD-10-CM | POA: Diagnosis present

## 2020-08-18 DIAGNOSIS — R9431 Abnormal electrocardiogram [ECG] [EKG]: Secondary | ICD-10-CM | POA: Diagnosis not present

## 2020-08-18 DIAGNOSIS — I213 ST elevation (STEMI) myocardial infarction of unspecified site: Secondary | ICD-10-CM | POA: Diagnosis not present

## 2020-08-18 DIAGNOSIS — I499 Cardiac arrhythmia, unspecified: Secondary | ICD-10-CM | POA: Diagnosis not present

## 2020-08-18 DIAGNOSIS — Z950 Presence of cardiac pacemaker: Secondary | ICD-10-CM | POA: Diagnosis not present

## 2020-08-18 DIAGNOSIS — I712 Thoracic aortic aneurysm, without rupture: Secondary | ICD-10-CM | POA: Diagnosis present

## 2020-08-18 LAB — CBC WITH DIFFERENTIAL/PLATELET
Abs Immature Granulocytes: 0.05 10*3/uL (ref 0.00–0.07)
Basophils Absolute: 0.1 10*3/uL (ref 0.0–0.1)
Basophils Relative: 1 %
Eosinophils Absolute: 0.1 10*3/uL (ref 0.0–0.5)
Eosinophils Relative: 1 %
HCT: 55.4 % — ABNORMAL HIGH (ref 39.0–52.0)
Hemoglobin: 17.7 g/dL — ABNORMAL HIGH (ref 13.0–17.0)
Immature Granulocytes: 1 %
Lymphocytes Relative: 26 %
Lymphs Abs: 2.5 10*3/uL (ref 0.7–4.0)
MCH: 30.4 pg (ref 26.0–34.0)
MCHC: 31.9 g/dL (ref 30.0–36.0)
MCV: 95 fL (ref 80.0–100.0)
Monocytes Absolute: 0.6 10*3/uL (ref 0.1–1.0)
Monocytes Relative: 6 %
Neutro Abs: 6.1 10*3/uL (ref 1.7–7.7)
Neutrophils Relative %: 65 %
Platelets: 170 10*3/uL (ref 150–400)
RBC: 5.83 MIL/uL — ABNORMAL HIGH (ref 4.22–5.81)
RDW: 13.6 % (ref 11.5–15.5)
WBC: 9.4 10*3/uL (ref 4.0–10.5)
nRBC: 0 % (ref 0.0–0.2)

## 2020-08-18 LAB — COMPREHENSIVE METABOLIC PANEL
ALT: 26 U/L (ref 0–44)
AST: 28 U/L (ref 15–41)
Albumin: 3.7 g/dL (ref 3.5–5.0)
Alkaline Phosphatase: 62 U/L (ref 38–126)
Anion gap: 10 (ref 5–15)
BUN: 29 mg/dL — ABNORMAL HIGH (ref 8–23)
CO2: 23 mmol/L (ref 22–32)
Calcium: 9.1 mg/dL (ref 8.9–10.3)
Chloride: 106 mmol/L (ref 98–111)
Creatinine, Ser: 1.3 mg/dL — ABNORMAL HIGH (ref 0.61–1.24)
GFR, Estimated: 59 mL/min — ABNORMAL LOW (ref >60)
Glucose, Bld: 137 mg/dL — ABNORMAL HIGH (ref 70–99)
Potassium: 3.7 mmol/L (ref 3.5–5.1)
Sodium: 139 mmol/L (ref 135–145)
Total Bilirubin: 1 mg/dL (ref 0.3–1.2)
Total Protein: 6.2 g/dL — ABNORMAL LOW (ref 6.5–8.1)

## 2020-08-18 LAB — MAGNESIUM: Magnesium: 2.1 mg/dL (ref 1.7–2.4)

## 2020-08-18 LAB — TROPONIN I (HIGH SENSITIVITY)
Troponin I (High Sensitivity): 184 ng/L (ref <18)
Troponin I (High Sensitivity): 221 ng/L (ref <18)

## 2020-08-18 LAB — RESP PANEL BY RT-PCR (FLU A&B, COVID) ARPGX2
Influenza A by PCR: NEGATIVE
Influenza B by PCR: NEGATIVE
SARS Coronavirus 2 by RT PCR: NEGATIVE

## 2020-08-18 MED ORDER — AMIODARONE HCL IN DEXTROSE 360-4.14 MG/200ML-% IV SOLN: 30.0000 mg/h | INTRAVENOUS | Status: DC

## 2020-08-18 MED ORDER — HEPARIN (PORCINE) 25000 UT/250ML-% IV SOLN
1350.0000 [IU]/h | INTRAVENOUS | Status: DC
  Administered 2020-08-18: 1250 [IU]/h via INTRAVENOUS
  Filled 2020-08-18 (×2): qty 250

## 2020-08-18 MED ORDER — AMIODARONE HCL 150 MG/3ML IV SOLN
300.0000 mg | Freq: Once | INTRAVENOUS | Status: AC
  Administered 2020-08-18: 300 mg via INTRAVENOUS

## 2020-08-18 MED ORDER — AMIODARONE HCL IN DEXTROSE 360-4.14 MG/200ML-% IV SOLN: 60.0000 mg/h | INTRAVENOUS | Status: DC

## 2020-08-18 MED ORDER — SODIUM CHLORIDE 0.9 % IV BOLUS
1000.0000 mL | Freq: Once | INTRAVENOUS | Status: AC
  Administered 2020-08-18: 1000 mL via INTRAVENOUS

## 2020-08-18 MED ORDER — ETOMIDATE 2 MG/ML IV SOLN
15.0000 mg | Freq: Once | INTRAVENOUS | Status: AC
  Administered 2020-08-18: 15 mg via INTRAVENOUS

## 2020-08-18 MED ORDER — HEPARIN SODIUM (PORCINE) 1000 UNIT/ML IJ SOLN
4000.0000 [IU] | Freq: Once | INTRAMUSCULAR | Status: AC
  Administered 2020-08-18: 4000 [IU] via INTRAVENOUS

## 2020-08-18 MED ORDER — LIDOCAINE HCL (CARDIAC) PF 100 MG/5ML IV SOSY
0.1500 mg/kg | PREFILLED_SYRINGE | Freq: Once | INTRAVENOUS | Status: AC
  Administered 2020-08-18: 15.2 mg via INTRAVENOUS

## 2020-08-18 MED ORDER — ASPIRIN 300 MG RE SUPP
300.0000 mg | Freq: Once | RECTAL | Status: AC
  Administered 2020-08-18: 300 mg via RECTAL

## 2020-08-18 MED ORDER — SODIUM CHLORIDE 0.9 % IV BOLUS
500.0000 mL | Freq: Once | INTRAVENOUS | Status: AC
  Administered 2020-08-18: 500 mL via INTRAVENOUS

## 2020-08-18 MED ORDER — ETOMIDATE 2 MG/ML IV SOLN
INTRAVENOUS | Status: AC
  Filled 2020-08-18: qty 10

## 2020-08-18 NOTE — ED Notes (Signed)
Pt belongings given to son. (phone, wallet and clothes)

## 2020-08-18 NOTE — ED Provider Notes (Signed)
[facility] EMERGENCY DEPARTMENT Provider Note   CSN: [id number] Arrival date & time: 08/18/20  2006     History Chief Complaint  Patient presents with  . Chest Pain    John [name] is a 70 y.o. male.  HPI  Patient is transferred from [facility] for ventricular tachycardia and NSTEMI.  Patient was cardioverted for ventricular tachycardia, given heparin bolus and amiodarone 300 mg IV push.  Patient reports he has no chest pain at this time.  Reports he does not feel short of breath.  He reports he feels fatigued.  Past Medical History:  Diagnosis Date  . Atrial flutter ([place])    s/p cardioversion  . Coronary artery disease   . Diabetes mellitus   . GERD (gastroesophageal reflux disease)   . History of nuclear stress test 04/04/2011   lexiscan; mod-large in size fixed inferolateral defect (scar); non-diagnostic for ischemia; low risk scan   . Hyperlipidemia   . Hypertension   . Left foot drop    r/t past disk srugery - uses Kevlar brace  . Myocardial infarction ([place])    posterior MI  . Shortness of breath   . Sleep apnea    on CPAP; 04/28/2007 split-night - AHI during total sleep 44.43/hr and REM 72.56/hr    Patient Active Problem List   Diagnosis Date Noted  . Capsulitis 05/21/2020  . Elevated troponin   . Hyperglycemia due to diabetes mellitus ([place]) 02/29/2020  . Thoracic aortic aneurysm ([place]) 02/29/2020  . Hypotensive episode 02/29/2020  . Obesity (BMI 30.0-34.9) 02/29/2020  . Lung nodule 02/19/2020  . Former smoker 02/19/2020  . Healthcare maintenance 02/19/2020  . Acoustic neuroma ([place]) 09/11/2017  . Asymmetrical sensorineural hearing loss 09/11/2017  . Encounter for cardioversion procedure   . Anticoagulation adequate 06/06/2016  . Fatigue 10/27/2015  . Bradycardia 10/27/2015  . Hyperlipidemia LDL goal <70 05/20/2015  . OSA on CPAP 07/23/2013  . Excessive daytime sleepiness 07/23/2013  . Hypertension   . Hyperlipidemia   .  Diabetes mellitus   . Chest pain 08/18/2012  . Tobacco abuse 08/18/2012  . Unstable angina, cath [facility] 02/27/12 02/26/2012  . CAD, multiple prior RCA PCI's. Last cath 2010, Myoview low risk Oct 2012 02/26/2012  . IDDM (insulin dependent diabetes mellitus) 02/26/2012  . HTN (hypertension) 02/26/2012  . Dyslipidemia, low HDL. Intol to fish oil and Noacin 02/26/2012  . Sleep apnea, on C-pap 02/26/2012  . COPD (chronic obstructive pulmonary disease) ([place]) 02/26/2012  . Smoking, quit one week ago 02/26/2012  . GERD (gastroesophageal reflux disease) 02/26/2012    Past Surgical History:  Procedure Laterality Date  . BACK SURGERY  1985  . CARDIAC CATHETERIZATION  2010   6 stents total  . CARDIAC CATHETERIZATION  01/2000   percutaneous transluminal coronary balloon angioplasty of mid RCA stenotic lesion  . CARDIAC CATHETERIZATION  06/2006   no stenting; ischemic cardiomyopathy, EF 40-45%  . CARDIOVERSION N/A 07/28/2016   Procedure: CARDIOVERSION;  Surgeon: Troy Sine, MD;  Location: [place];  Service: Cardiovascular;  Laterality: N/A;  . CORONARY ANGIOPLASTY  09/1998   mid-distal RCA balloon dilatation, 4.5 & 5.0 stents   . CORONARY ANGIOPLASTY WITH STENT PLACEMENT  03/1994   angioplasty & stenting (non-DES) of circumflex/prox ramus intermedius  . CORONARY ANGIOPLASTY WITH STENT PLACEMENT  10/1994   large iliac [id number] stent to RCA  . CORONARY ANGIOPLASTY WITH STENT PLACEMENT  12/2002   4.11mm stents x2 of RCA  . CORONARY ANGIOPLASTY WITH STENT PLACEMENT  01/2005   cutting balloon arthrectomy of distal RCA & Cypher DES 3.5x13; cutting balloon arthrectomy of mid RCA with Cypher DES 3.5x18  . CORONARY ANGIOPLASTY WITH STENT PLACEMENT  11/2008   stenting of mid RCA with 4.0x48mm driver, non-DES  . INTRAVASCULAR PRESSURE WIRE/FFR STUDY N/A 03/02/2020   Procedure: INTRAVASCULAR PRESSURE WIRE/FFR STUDY;  Surgeon: Leonie Man, MD;  Location: [place] CV LAB;  Service: Cardiovascular;   Laterality: N/A;  . LEFT HEART CATH AND CORONARY ANGIOGRAPHY N/A 03/02/2020   Procedure: LEFT HEART CATH AND CORONARY ANGIOGRAPHY;  Surgeon: Leonie Man, MD;  Location: [place] CV LAB;  Service: Cardiovascular;  Laterality: N/A;  . LEFT HEART CATHETERIZATION WITH CORONARY ANGIOGRAM N/A 02/27/2012   Procedure: LEFT HEART CATHETERIZATION WITH CORONARY ANGIOGRAM;  Surgeon: Lorretta Harp, MD;  Location: [facility] CATH LAB;  Service: Cardiovascular;  Laterality: N/A;  . TRANSTHORACIC ECHOCARDIOGRAM  07/29/2010   EF 50=55%, mod inf wall hypokinesis & mild post wall hypokinesis; LA mild-mod dilated; mild mitral annular calcif & mild MR; mild TR & elevated RV systolic pressure; AV mildly sclerotic; mild aortic root dilatation        Family History  Problem Relation Age of Onset  . Heart attack Father     Social History   Tobacco Use  . Smoking status: Former Smoker    Packs/day: 1.00    Years: 50.00    Pack years: 50.00    Types: Cigarettes    Quit date: 07/20/2016    Years since quitting: 4.0  . Smokeless tobacco: Never Used  Substance Use Topics  . Alcohol use: Not Currently    Alcohol/week: 0.0 standard drinks  . Drug use: No    Home Medications Prior to Admission medications   Medication Sig Start Date End Date Taking? Authorizing Provider  albuterol (VENTOLIN HFA) 108 (90 Base) MCG/ACT inhaler Inhale 2 puffs into the lungs every 6 (six) hours as needed for wheezing or shortness of breath. 06/14/20   Garner Nash, DO  aspirin 81 MG tablet Take 81 mg by mouth at bedtime.     [provider]  ELIQUIS 5 MG TABS tablet TAKE 1 TABLET BY MOUTH TWICE A DAY 04/23/20   Troy Sine, MD  Fluticasone Furoate (ARNUITY ELLIPTA) 200 MCG/ACT AEPB Inhale 1 puff into the lungs daily. 07/21/20   Icard, Octavio Graves, DO  insulin aspart (NOVOLOG FLEXPEN) 100 UNIT/ML FlexPen INJECT 5-20 UNITS SUBCUTANEOUSLY WITH LUNCH AND DINNER 06/16/20   [name], MD  Insulin Pen Needle  (NOVOFINE) 32G X 6 MM MISC 1 each by Other route daily. 08/19/19   [name], MD  JARDIANCE 25 MG TABS tablet TAKE 1 TABLET BY MOUTH EVERY DAY 06/02/20   [place], Modena Nunnery, MD  LEVEMIR FLEXTOUCH 100 UNIT/ML FlexPen INJECT 46 UNITS INTO THE SKIN DAILY. DX:E11.9 04/23/20   [name], MD  montelukast (SINGULAIR) 10 MG tablet TAKE 1 TABLET BY MOUTH EVERY DAY Patient taking differently: Take 10 mg by mouth in the morning. 09/23/19   [name], MD  nebivolol (BYSTOLIC) 2.5 MG tablet Take 1 tablet (2.5 mg total) by mouth daily. 10/01/19   Troy Sine, MD  nitroGLYCERIN (NITROSTAT) 0.4 MG SL tablet PLACE 1 TABLET (0.4 MG TOTAL) UNDER THE TONGUE EVERY 5 (FIVE) MINUTES AS NEEDED FOR CHEST PAIN. 07/27/20   Troy Sine, MD  [facility] ULTRA test strip USE TO CHECK BLOOD SUGAR 3 TIMES A DAY (E11.9) 07/16/20   [name],  MD  rosuvastatin (CRESTOR) 20 MG tablet TAKE 1 TABLET BY MOUTH EVERY DAY 06/21/20   Troy Sine, MD  sacubitril-valsartan (ENTRESTO) 24-26 MG Take 1 tablet by mouth 2 (two) times daily. 03/02/20   Isaiah Serge, NP    Allergies    Benazepril, Fish allergy, and Omega-3 fatty acids  Review of Systems   Review of Systems 10 systems reviewed negative except as per HPI Physical Exam Updated Vital Signs BP 108/73   Pulse (!) 52   Temp 98.1 F (36.7 C) (Oral)   Resp 20   Ht 6' (1.829 m)   Wt 100.7 kg   SpO2 95%   BMI 30.11 kg/m   Physical Exam Constitutional:      Comments: Alert and nontoxic.  No respiratory distress at rest.  Slightly pale in appearance.  Mental status clear  HENT:     Mouth/Throat:     Pharynx: Oropharynx is clear.  Eyes:     Extraocular Movements: Extraocular movements intact.  Cardiovascular:     Comments: Bradycardia.  Occasional ectopy. Pulmonary:     Effort: Pulmonary effort is normal.     Breath sounds: Normal breath sounds.  Abdominal:     General: There is no distension.     Palpations: Abdomen is soft.      Tenderness: There is no guarding.  Musculoskeletal:        General: Normal range of motion.  Skin:    General: Skin is warm and dry.  Neurological:     General: No focal deficit present.     Motor: No weakness.     Coordination: Coordination normal.  Psychiatric:        Mood and Affect: Mood normal.     ED Results / Procedures / Treatments   Labs (all labs ordered are listed, but only abnormal results are displayed) Labs Reviewed  COMPREHENSIVE METABOLIC PANEL - Abnormal; Notable for the following components:      Result Value   Glucose, Bld 137 (*)    BUN 29 (*)    Creatinine, Ser 1.30 (*)    Total Protein 6.2 (*)    GFR, Estimated 59 (*)    All other components within normal limits  CBC WITH DIFFERENTIAL/PLATELET - Abnormal; Notable for the following components:   RBC 5.83 (*)    Hemoglobin 17.7 (*)    HCT 55.4 (*)    All other components within normal limits  TROPONIN I (HIGH SENSITIVITY) - Abnormal; Notable for the following components:   Troponin I (High Sensitivity) 184 (*)    All other components within normal limits  RESP PANEL BY RT-PCR (FLU A&B, COVID) ARPGX2  MAGNESIUM  TROPONIN I (HIGH SENSITIVITY)  TROPONIN I (HIGH SENSITIVITY)    EKG EKG Interpretation  Date/Time:  Wednesday August 18 2020 20:52:02 EST Ventricular Rate:  50 PR Interval:    QRS Duration: 160 QT Interval:  418 QTC Calculation: 382 R Axis:   82 Text Interpretation: Sinus rhythm LVH with secondary repolarization abnormality Inferior infarct, old Inferior ST elevation and anterior ST depression Since last tracing 2 min. ago, No significant change was found Confirmed by Daleen Bo [id number]) on 08/18/2020 9:27:40 PM   Radiology No results found.  Procedures Procedures  CRITICAL CARE Performed by: Charlesetta Shanks   Total critical care time: 20 minutes  Critical care time was exclusive of separately billable procedures and treating other patients.  Critical care was necessary to  treat or prevent imminent or life-threatening deterioration.  Critical  care was time spent personally by me on the following activities: development of treatment plan with patient and/or surrogate as well as nursing, discussions with consultants, evaluation of patient's response to treatment, examination of patient, obtaining history from patient or surrogate, ordering and performing treatments and interventions, ordering and review of laboratory studies, ordering and review of radiographic studies, pulse oximetry and re-evaluation of patient's condition. Medications Ordered in ED Medications  amiodarone (NEXTERONE PREMIX) 360-4.14 MG/200ML-% (1.8 mg/mL) IV infusion (has no administration in time range)  amiodarone (NEXTERONE PREMIX) 360-4.14 MG/200ML-% (1.8 mg/mL) IV infusion (has no administration in time range)  sodium chloride 0.9 % bolus 500 mL (0 mLs Intravenous Stopped 08/18/20 2056)  etomidate (AMIDATE) 2 MG/ML injection (  Given 08/18/20 2106)  aspirin suppository 300 mg (300 mg Rectal Given 08/18/20 2056)  heparin sodium (porcine) injection 4,000 Units (4,000 Units Intravenous Given 08/18/20 2056)  sodium chloride 0.9 % bolus 1,000 mL (0 mLs Intravenous Stopped 08/18/20 2203)  sodium chloride 0.9 % bolus 1,000 mL (0 mLs Intravenous Stopped 08/18/20 2203)  etomidate (AMIDATE) injection 15 mg (15 mg Intravenous Given 08/18/20 2041)  amiodarone (CORDARONE) injection 300 mg (300 mg Intravenous Given 08/18/20 2045)  lidocaine (cardiac) 100 mg/72mL (XYLOCAINE) injection 2% 15.2 mg (15.2 mg Intravenous Given 08/18/20 2047)    ED Course  I have reviewed the triage vital signs and the nursing notes.  Pertinent labs & imaging results that were available during my care of the patient were reviewed by me and considered in my medical decision making (see chart for details).    MDM Rules/Calculators/A&P HEAR Score: 7                        Patient is transferred from [facility] with episode of V. tach requiring  cardioversion and amiodarone bolus and heparin bolus.  Patient arrives alert without respiratory distress.  Patient has a bradycardic rhythm with occasional ectopy.  He is denying any active chest pain.  Will resume ACS heparin and ordered amiodarone drip.  This was subsequently discontinued by Dr. Radford Pax in favor of observing rhythm at this time.  Admission to cardiology. Final Clinical Impression(s) / ED Diagnoses Final diagnoses:  Ventricular tachycardia ([place])  Acute ST elevation myocardial infarction (STEMI) involving right coronary artery [facility])    Rx / DC Orders ED Discharge Orders    None       Charlesetta Shanks, MD 08/23/20 0830

## 2020-08-18 NOTE — Progress Notes (Signed)
ANTICOAGULATION CONSULT NOTE - Initial Consult  Pharmacy Consult for heparin Indication: chest pain/ACS  Allergies  Allergen Reactions  . Benazepril Other (See Comments)    hyperkalemia  . Fish Allergy Itching  . Omega-3 Fatty Acids Hives and Itching    Patient Measurements: Height: 6' (182.9 cm) Weight: 100.7 kg (222 lb) IBW/kg (Calculated) : 77.6 Heparin Dosing Weight:  98 kg  Vital Signs: Temp: 98.1 F (36.7 C) (03/02 2020) Temp Source: Oral (03/02 2020) BP: 108/73 (03/02 2200) Pulse Rate: 52 (03/02 2200)  Labs: Recent Labs    08/18/20 2040  HGB 17.7*  HCT 55.4*  PLT 170  CREATININE 1.30*  TROPONINIHS 184*    Estimated Creatinine Clearance: 64 mL/min (A) (by C-G formula based on SCr of 1.3 mg/dL (H)).   Medical History: Past Medical History:  Diagnosis Date  . Atrial flutter ([place])    s/p cardioversion  . Coronary artery disease   . Diabetes mellitus   . GERD (gastroesophageal reflux disease)   . History of nuclear stress test 04/04/2011   lexiscan; mod-large in size fixed inferolateral defect (scar); non-diagnostic for ischemia; low risk scan   . Hyperlipidemia   . Hypertension   . Left foot drop    r/t past disk srugery - uses Kevlar brace  . Myocardial infarction ([place])    posterior MI  . Shortness of breath   . Sleep apnea    on CPAP; 04/28/2007 split-night - AHI during total sleep 44.43/hr and REM 72.56/hr    Medications:  (Not in a hospital admission)   Assessment: 21 YOM who presented to the ED at [facility] with chest pain and found to be in [place] requiring multiple shocks. Pharmacy consulted to start IV heparin for ACS event. Of note, patient is on Eliquis at home. Pt confirmed last dose of Eliquis was this AM.   H/H high, Plt wnl. Of note, patient received a bolus of IV heparin of 4000 units at [facility]  Goal of Therapy:  Heparin level 0.3-0.7 units/ml aPTT 66-102 seconds Monitor platelets by anticoagulation protocol: Yes   Plan:  -Start  heparin infusion at 1250 units/hr -F/u 8 hr HL, aPTT -Monitor daily HL, aPTT, CBC and s/s of bleeding   Albertina Parr, PharmD., BCPS, BCCCP Clinical Pharmacist Please refer to [facility] for unit-specific pharmacist

## 2020-08-18 NOTE — ED Triage Notes (Signed)
Pt to er, pt states that he is here for some chest pain off an on since yesterday, states that with exertion yesterday he had chest pain with diaphoresis and sob.

## 2020-08-18 NOTE — ED Notes (Signed)
The pt reports that he just  Started having some chest pain about 10 minutes ago.

## 2020-08-18 NOTE — H&P (Addendum)
Cardiology Admission History and Physical:   Patient ID: John [name] MRN: [id number]; DOB: February 12, 1951   Admission date: 08/18/2020  Primary Care Provider: [name], MD [facility] Cardiologist: Shelva Majestic, MD  [place] Electrophysiologist:  None   Chief Complaint:  Wide complex tachycardia and chest pain  Patient Profile:   John [name] is a 70 y.o. male with with a hx of ASCAD (multiple prior PCIs reported in the RCA and Cx, last stenting in RCA 2010), ischemic cardiomyopathy, HTN, HLD, DM, OSA, TAA, interstitial lung disease and pulmonary nodule followed by pulmonology, and paroxysmal atrial flutter s/p prior cardioversion who was seen in ER at [facility] for the evaluation of chest pain with SOB and found to be in ventricular tachycardia > 200bpm requiring multiple shocks to restore NSR and now transferred to [facility] for further evaluation at the request of Dr. Eulis Foster.   History of Present Illness:   John [name] is a 70 y.o. male with with a hx of ASCAD (multiple prior PCIs reported in the RCA and Cx, last stenting in RCA 2010), ischemic cardiomyopathy, HTN, HLD, DM, OSA on CPAP, TAA, interstitial lung disease and pulmonary nodule followed by pulmonology, and paroxysmal atrial flutter s/p prior cardioversion.  His last cath was done in 02/2020 which showed 55% ostial LCx followed by a 40% prox LCX and then 60% side branch in OM1 (DFR and FFR no c/w physiologically significant lesion), 30% mid LAD, 55% pRCA (normal DFR 0.98) and 15% in -stent restenosis throughout the entire proximal to distal RCA ("full metal jacket") stents which extended into the RPAV into distal trifurcation with 15% in-stent restenosis.  Medical management was recommended.  He has had several echoes done in the past all with EF 40-50% with inferior and inferolateral AK.    Today he presented to [facility] ER with complaints of intermittent chest pain associated with SOB, weakness, dizziness and palpitations that had  been going on since yesterday.  Today he developed CP in the center of his chest with radiation into his jaw with associated SOB with exertion.  He decided to go to the ER and was driven by his son.  On arrival in ER he was found to be in monomorphic VT at 224bpm.  He was sedated with Etomidate and shocked at 120J and then 200J x 2 with no change in rhythm.  He was loaded with Amio 300mg  IV and Lidocaine 100mg  IV and converted to sinus bradycardia with bigeminal PVCs.  There was some concern of possible ST elevated in the inferior leads and a STEMI was called.  Dr. Tamala Julian reviewed the EKGs but unclear whether this represented a true STEMI as ST segments only elevated in III and similar to prior EKG.  He is now transferred down to [facility] ER for further evaluation.   He tells me that he has had chest pain off and on for the past week but also has been noticing palpitations but not necessarily at the same time.  The pain will radiate from his chest to his neck and throat and is associated with SOB.  It is similar to his prior angina in the past.  It is mainly exertional and lasts a few minutes and resolves.  He was working out in the yard yesterday and pain became severe and he felt weak and SOB.  Pain resolved with rest and reoccurred last night and took SL NTG with resoluation of the pain.  Today his symptoms persisted and he came to  the ER where he was found to be in VT (see above).  Currently he is pain free with no SOB.  Tele shows sinus bradycardia with occasional PVCs.  SBP has improved to the low 100's.  He states that he took his last Eliquis this am.   Past Medical History:  Diagnosis Date  . Atrial flutter ([place])    s/p cardioversion  . Coronary artery disease   . Diabetes mellitus   . GERD (gastroesophageal reflux disease)   . History of nuclear stress test 04/04/2011   lexiscan; mod-large in size fixed inferolateral defect (scar); non-diagnostic for ischemia; low risk scan   . Hyperlipidemia   .  Hypertension   . Left foot drop    r/t past disk srugery - uses Kevlar brace  . Myocardial infarction ([place])    posterior MI  . Shortness of breath   . Sleep apnea    on CPAP; 04/28/2007 split-night - AHI during total sleep 44.43/hr and REM 72.56/hr    Past Surgical History:  Procedure Laterality Date  . BACK SURGERY  1985  . CARDIAC CATHETERIZATION  2010   6 stents total  . CARDIAC CATHETERIZATION  01/2000   percutaneous transluminal coronary balloon angioplasty of mid RCA stenotic lesion  . CARDIAC CATHETERIZATION  06/2006   no stenting; ischemic cardiomyopathy, EF 40-45%  . CARDIOVERSION N/A 07/28/2016   Procedure: CARDIOVERSION;  Surgeon: Troy Sine, MD;  Location: [place];  Service: Cardiovascular;  Laterality: N/A;  . CORONARY ANGIOPLASTY  09/1998   mid-distal RCA balloon dilatation, 4.5 & 5.0 stents   . CORONARY ANGIOPLASTY WITH STENT PLACEMENT  03/1994   angioplasty & stenting (non-DES) of circumflex/prox ramus intermedius  . CORONARY ANGIOPLASTY WITH STENT PLACEMENT  10/1994   large iliac [id number] stent to RCA  . CORONARY ANGIOPLASTY WITH STENT PLACEMENT  12/2002   4.39mm stents x2 of RCA  . CORONARY ANGIOPLASTY WITH STENT PLACEMENT  01/2005   cutting balloon arthrectomy of distal RCA & Cypher DES 3.5x13; cutting balloon arthrectomy of mid RCA with Cypher DES 3.5x18  . CORONARY ANGIOPLASTY WITH STENT PLACEMENT  11/2008   stenting of mid RCA with 4.0x53mm driver, non-DES  . INTRAVASCULAR PRESSURE WIRE/FFR STUDY N/A 03/02/2020   Procedure: INTRAVASCULAR PRESSURE WIRE/FFR STUDY;  Surgeon: Leonie Man, MD;  Location: [place] [facility];  Service: Cardiovascular;  Laterality: N/A;  . LEFT HEART CATH AND CORONARY ANGIOGRAPHY N/A 03/02/2020   Procedure: LEFT HEART CATH AND CORONARY ANGIOGRAPHY;  Surgeon: Leonie Man, MD;  Location: [place] [facility];  Service: Cardiovascular;  Laterality: N/A;  . LEFT HEART CATHETERIZATION WITH CORONARY ANGIOGRAM N/A 02/27/2012   Procedure:  LEFT HEART CATHETERIZATION WITH CORONARY ANGIOGRAM;  Surgeon: Lorretta Harp, MD;  Location: [facility] C[facility];  Service: Cardiovascular;  Laterality: N/A;  . TRANSTHORACIC ECHOCARDIOGRAM  07/29/2010   EF 50=55%, mod inf wall hypokinesis & mild post wall hypokinesis; LA mild-mod dilated; mild mitral annular calcif & mild MR; mild TR & elevated RV systolic pressure; AV mildly sclerotic; mild aortic root dilatation      Medications Prior to Admission: Prior to Admission medications   Medication Sig Start Date End Date Taking? Authorizing Provider  albuterol (VENTOLIN HFA) 108 (90 Base) MCG/ACT inhaler Inhale 2 puffs into the lungs every 6 (six) hours as needed for wheezing or shortness of breath. 06/14/20   Garner Nash, DO  aspirin 81 MG tablet Take 81 mg by mouth at bedtime.     [provider]  ELIQUIS 5 MG TABS tablet TAKE 1 TABLET BY MOUTH TWICE A DAY 04/23/20   Troy Sine, MD  Fluticasone Furoate (ARNUITY ELLIPTA) 200 MCG/ACT AEPB Inhale 1 puff into the lungs daily. 07/21/20   Icard, Octavio Graves, DO  insulin aspart (NOVOLOG FLEXPEN) 100 UNIT/ML FlexPen INJECT 5-20 UNITS SUBCUTANEOUSLY WITH LUNCH AND DINNER 06/16/20   [name], MD  Insulin Pen Needle (NOVOFINE) 32G X 6 MM MISC 1 each by Other route daily. 08/19/19   [name], MD  JARDIANCE 25 MG TABS tablet TAKE 1 TABLET BY MOUTH EVERY DAY 06/02/20   [place], Modena Nunnery, MD  LEVEMIR FLEXTOUCH 100 UNIT/ML FlexPen INJECT 46 UNITS INTO THE SKIN DAILY. DX:E11.9 04/23/20   [name], MD  montelukast (SINGULAIR) 10 MG tablet TAKE 1 TABLET BY MOUTH EVERY DAY Patient taking differently: Take 10 mg by mouth in the morning. 09/23/19   [name], MD  nebivolol (BYSTOLIC) 2.5 MG tablet Take 1 tablet (2.5 mg total) by mouth daily. 10/01/19   Troy Sine, MD  nitroGLYCERIN (NITROSTAT) 0.4 MG SL tablet PLACE 1 TABLET (0.4 MG TOTAL) UNDER THE TONGUE EVERY 5 (FIVE) MINUTES AS NEEDED FOR CHEST PAIN. 07/27/20   Troy Sine,  MD  [facility] ULTRA test strip USE TO CHECK BLOOD SUGAR 3 TIMES A DAY (E11.9) 07/16/20   [name], MD  rosuvastatin (CRESTOR) 20 MG tablet TAKE 1 TABLET BY MOUTH EVERY DAY 06/21/20   Troy Sine, MD  sacubitril-valsartan (ENTRESTO) 24-26 MG Take 1 tablet by mouth 2 (two) times daily. 03/02/20   Isaiah Serge, NP     Allergies:    Allergies  Allergen Reactions  . Benazepril Other (See Comments)    hyperkalemia  . Fish Allergy Itching  . Omega-3 Fatty Acids Hives and Itching    Social History:   Social History   Socioeconomic History  . Marital status: Married    Spouse name: Not on file  . Number of children: 3  . Years of education: Not on file  . Highest education level: Not on file  Occupational History  . 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 [employer]  Tobacco Use  . Smoking status: Former Smoker    Packs/day: 1.00    Years: 50.00    Pack years: 50.00    Types: Cigarettes    Quit date: 07/20/2016    Years since quitting: 4.0  . Smokeless tobacco: Never Used  Substance and Sexual Activity  . Alcohol use: Not Currently    Alcohol/week: 0.0 standard drinks  . Drug use: No  . Sexual activity: Yes  Other Topics Concern  . Not on file  Social History Narrative  . Not on file   Social Determinants of Health   Financial Resource Strain: Not on file  Food Insecurity: Not on file  Transportation Needs: Not on file  Physical Activity: Not on file  Stress: Not on file  Social Connections: Not on file  Intimate Partner Violence: Not on file    Family History:   The patient's family history includes Heart attack in his father.    ROS:  Please see the history of present illness.  All other ROS reviewed and negative.     Physical Exam/Data:   Vitals:   08/18/20 2045 08/18/20 2100 08/18/20 2110 08/18/20 2200  BP: 99/61 (!) 89/66 104/83 108/73  Pulse: (!) 53 (!) 51 (!) 55 (!) 52  Resp: 17 (!) 24 (!) 28 20  Temp:       TempSrc:      SpO2: 100% 98% 99% 95%  Weight:      Height:        Intake/Output Summary (Last 24 hours) at 08/18/2020 2205 Last data filed at 08/18/2020 2056 Gross per 24 hour  Intake 500 ml  Output --  Net 500 ml   Last 3 Weights 08/18/2020 08/02/2020 03/12/2020  Weight (lbs) 222 lb 223 lb 4 oz 222 lb 12.8 oz  Weight (kg) 100.699 kg 101.266 kg 101.061 kg     Body mass index is 30.11 kg/m.  General:  Well nourished, well developed, in no acute distress HEENT: normal Lymph: no adenopathy Neck: no JVD Endocrine:  No thryomegaly Vascular: No carotid bruits; FA pulses 2+ bilaterally without bruits  Cardiac:  normal S1, S2; RRR; no murmur  Lungs:  clear to auscultation bilaterally, no wheezing, rhonchi or rales  Abd: soft, nontender, no hepatomegaly  Ext: no edema Musculoskeletal:  No deformities, BUE and BLE strength normal and equal Skin: warm and dry  Neuro:  CNs 2-12 intact, no focal abnormalities noted Psych:  Normal affect    EKG:  The ECG that was done at [facility] ER was personally reviewed and demonstrates wide complex tachycardia c/w ventricular tachycardia at 224bpm.  Followup EKG shows sinus bradycardia with bigeminal PVCs with some junctional escape beats  Relevant CV Studies: Cardiac Cath 02/2020 Conclusion    Ost Cx to Prox Cx lesion is 55% stenosed. Prox Cx lesion is 40% stenosed with 60% stenosed side branch in 1st Mrg. => Tandem lesions were evaluated with DFR (0.95), and FFR (0.94) = not physiologically significant  Mid LAD lesion is 30% stenosed.  Prox RCA lesion is 55% stenosed. Lesion was evaluated with DFR (0.98)  Previously placed stents in the prox RCA to Dist RCA are 15% in-stent restenosis  Previously placed stents extend into RPAV-1 lesion into the distal trifurcation, 15% in-stent restenosis  RPAV-2 lesion is 45% stenosed.  There is no aortic valve stenosis.   SUMMARY  Mild in-stent restenosis throughout the entire proximal to distal RCA-RP AV  with 50% eccentric lesions prior to and after stents. ->  DFR negative  Tandem 50 and 40% lesions in ostial/proximal LCx-DFR and FFR negative.  Normal LVEDP.   RECOMMENDATIONS  RETURN to nursing unit for ongoing care.  Stable for discharge today based on cardiac cath.  Continue aggressive risk factor modification/Guideline Directed Medical Therapy  Diagnostic Dominance: Right   2D echo 02/2020 IMPRESSIONS   1. Left ventricular ejection fraction, by estimation, is 45 to 50%. The  left ventricle has mildly decreased function. The left ventricle  demonstrates global hypokinesis. Left ventricular diastolic parameters are  indeterminate.  2. Right ventricular systolic function is normal. The right ventricular  size is normal.  3. Left atrial size was severely dilated.  4. The mitral valve is normal in structure. Trivial mitral valve  regurgitation. No evidence of mitral stenosis.  5. The aortic valve is tricuspid. Aortic valve regurgitation is not  visualized. No aortic stenosis is present.  6. Aortic dilatation noted. There is mild dilatation of the aortic root,  measuring 41 mm.  7. The inferior vena cava is dilated in size with >50% respiratory  variability, suggesting right atrial pressure of 8 mmHg.   2D echo 04/26/2017 Study Conclusions   - Left ventricle: The cavity size was at the upper limits of  normal. Wall thickness was increased  in a pattern of mild LVH.  Basal to mid inferior and inferolateral akinesis. Basal to mid  anterolateral hypokinesis. Systolic function was mildly to  moderately reduced. The estimated ejection fraction was in the  range of 40% to 45%. Doppler parameters are consistent with  abnormal left ventricular relaxation (grade 1 diastolic  dysfunction).  - Aortic valve: There was no stenosis.  - Aorta: Borderline dilated aortic root. Aortic root dimension: 37  mm (ED).  - Mitral valve: There was trivial regurgitation.   - Left atrium: The atrium was mildly dilated.  - Right ventricle: The cavity size was normal. Systolic function  was normal.  - Right atrium: The atrium was mildly to moderately dilated.  - Tricuspid valve: Peak RV-RA gradient (S): 28 mm Hg.  - Pulmonary arteries: PA peak pressure: 31 mm Hg (S).  - Inferior vena cava: The vessel was normal in size. The  respirophasic diameter changes were in the normal range (>= 50%),  consistent with normal central venous pressure.   Impressions:   - Upper normal LV size with mild LV hypertrophy. EF 40-45% with  wall motion abnormalities as noted above. Normal RV size and  systolic function. No significant valvular abnormalities.    Laboratory Data:  High Sensitivity Troponin:   Recent Labs  Lab 08/18/20 2040  TROPONINIHS 184*      Chemistry Recent Labs  Lab 08/18/20 2040  NA 139  K 3.7  CL 106  CO2 23  GLUCOSE 137*  BUN 29*  CREATININE 1.30*  CALCIUM 9.1  GFRNONAA 59*  ANIONGAP 10    Recent Labs  Lab 08/18/20 2040  PROT 6.2*  ALBUMIN 3.7  AST 28  ALT 26  ALKPHOS 62  BILITOT 1.0   Hematology Recent Labs  Lab 08/18/20 2040  WBC 9.4  RBC 5.83*  HGB 17.7*  HCT 55.4*  MCV 95.0  MCH 30.4  MCHC 31.9  RDW 13.6  PLT 170   BNPNo results for input(s): BNP, PROBNP in the last 168 hours.  DDimer No results for input(s): DDIMER in the last 168 hours.   Radiology/Studies:  No results found.   Assessment and Plan:   1. Ventricular Tachycardia -patient was hemodynamically unstable with hypotension and chest pain -shocked 3 times at 120 and then 200J x 2 after Etomidate with no success at conversion  -loaded with Amio 300mg  and Lidocaine 100mg  IV and converted to sinus bradycardia with PVCs and occasional junctional escape beats -no further VT at this time -K+ 3.7  -likely related to known inferior scar as it is monomorphic but he has been having chest pain for several days which could be related to  intermittent VT with demand ischemia  (he had minimal instent restenosis of the RCA by cath in Sept 2021 but did have 50% proximal RCA).  He has noticed palpitations off and on as well during this time but not always associated with CP -IV Amio off due to bradycardia -Admit to 2H CCU -EP to see in am -plan for cath in PM to redefine coronary anatomy -replete K+ to keep > 4 -check Mag level -repeat 2D echo in am to reassess LVF  2.  Chest pain -he has had several days of chest pain associated with SOB and diaphoresis and nitrate responsive but also had been associated with palpitations -unclear whether he is having intermittent VT resulting in demand ischemia and CP or has had progression of CAD with new angina -hsTrop pending but will be elevated in setting  of attempted cardioversion -continue ASA and statin -hold BB due to hypotension -hold Eliquis for cath -start IV Heparin gtt per pharmacy -repeat 2D echo to assess LVF  3.  ASCAD -last cath was done in 02/2020 which showed 55% ostial LCx followed by a 40% prox LCX and then 60% side branch in OM1 (DFR and FFR no c/w physiologically significant lesion), 30% mid LAD, 55% pRCA (normal DFR 0.98) and 15% in -stent restenosis throughout the entire proximal to distal RCA ("full metal jacket") stents which extended into the RPAV into distal trifurcation with 15% in-stent restenosis.  Medical management was recommended.  -continue ASA and statin -hold BB due to bradycardia and hypotension  4.  Ischemic DCM -EF has been 40-50% on consecutive echoes with inferior and inferolateral akinesis -will repeat echo in am -hold BB and Entresto at this time due to hypotension  5.  HLD -LDL goal < 70 -LDL was 61 in Sept 2021 -continue Crestor 20mg  daily  5.  DM Type 2 -continue Jardiance 25mg  daily -cover with SS Insulin with qac and hs BS checks  6.  PAF -s/p prior DCCV -hold Eliquis for cath -hold BB for hypotension and bradycardia -IV Heparin  per pharmacy  According to the Revised Cardiac Risk Index (RCRI), his @FLOWAMB (7902)@  His @FLOWAMB (4097)@ according to the Duke Activity Status Index (DASI). The patient is critically ill with multiple organ systems failure and requires high complexity decision making for assessment and support, frequent evaluation and titration of therapies, application of advanced monitoring technologies and extensive interpretation of multiple databases. Critical Care Time devoted to patient care services described in this note independent of APP time is 60 minutes with >50% of time spent in direct patient care.             :[id number]}   HEAR Score (for undifferentiated chest pain):  HEAR Score: 7{  CHA2DS2-VASc Score = 4  This indicates a 4.8% annual risk of stroke. The patient's score is based upon: CHF History: No HTN History: Yes Diabetes History: Yes Stroke History: No Vascular Disease History: Yes Age Score: 1 Gender Score: 0   Severity of Illness: The appropriate patient status for this patient is INPATIENT. Inpatient status is judged to be reasonable and necessary in order to provide the required intensity of service to ensure the patient's safety. The patient's presenting symptoms, physical exam findings, and initial radiographic and laboratory data in the context of their chronic comorbidities is felt to place them at high risk for further clinical deterioration. Furthermore, it is not anticipated that the patient will be medically stable for discharge from the hospital within 2 midnights of admission. The following factors support the patient status of inpatient.   " The patient's presenting symptoms include chest pain and ventricular tachycardia. " The worrisome physical exam findings include hypotension . " The initial radiographic and laboratory data are worrisome because of ventricular tachycardia on EKG. " The chronic co-morbidities include ASCAD, HTN, atrial fibrillation, ischemic  DCM.   * I certify that at the point of admission it is my clinical judgment that the patient will require inpatient hospital care spanning beyond 2 midnights from the point of admission due to high intensity of service, high risk for further deterioration and high frequency of surveillance required.*    For questions or updates, please contact [place] Please consult [email or web address] for contact info under     Signed, Fransico Him, MD  08/18/2020 10:05 PM

## 2020-08-18 NOTE — ED Notes (Signed)
Date and time results received: 08/18/20 2145  Test: troponin Critical Value: 184  Name of Provider Notified: Eulis Foster, MD  Orders Received? Or Actions Taken?: acknowledged

## 2020-08-18 NOTE — ED Provider Notes (Signed)
[facility] EMERGENCY DEPARTMENT Provider Note   CSN: [id number] Arrival date & time: 08/18/20  2006     History Chief Complaint  Patient presents with  . Chest Pain    John [name] is a 70 y.o. male.  HPI Patient seen by me at 8:23 PM.  He presents for evaluation of intermittent chest pain, weakness, dizziness and some palpitations.  He has a history of coronary artery disease, and stenting.  He is on Eliquis.  Today, the patient noticed chest pain in the center of his chest rating to his center jaw.  He came here private vehicle, driven by his son.  He has had some shortness of breath with ambulating but it is not persistent.  He denies fever, chills, focal weakness or paresthesia.  He has a history of cardioversion.  There are no other known modifying factors. HPI: A 70 year old patient with a history of treated diabetes, hypertension, hypercholesterolemia and obesity presents for evaluation of chest pain. Initial onset of pain was approximately 1-3 hours ago. The patient's chest pain is described as heaviness/pressure/tightness and is worse with exertion. The patient reports some diaphoresis. The patient's chest pain is middle- or left-sided, is not well-localized, is not sharp and does radiate to the arms/jaw/neck. The patient does not complain of nausea. The patient has no history of stroke, has no history of peripheral artery disease, has not smoked in the past 90 days and has no relevant family history of coronary artery disease (first degree relative at less than age 73).   Past Medical History:  Diagnosis Date  . Atrial flutter ([place])    s/p cardioversion  . Coronary artery disease   . Diabetes mellitus   . GERD (gastroesophageal reflux disease)   . History of nuclear stress test 04/04/2011   lexiscan; mod-large in size fixed inferolateral defect (scar); non-diagnostic for ischemia; low risk scan   . Hyperlipidemia   . Hypertension   . Left foot drop    r/t past disk  srugery - uses Kevlar brace  . Myocardial infarction ([place])    posterior MI  . Shortness of breath   . Sleep apnea    on CPAP; 04/28/2007 split-night - AHI during total sleep 44.43/hr and REM 72.56/hr    Patient Active Problem List   Diagnosis Date Noted  . Capsulitis 05/21/2020  . Elevated troponin   . Hyperglycemia due to diabetes mellitus ([place]) 02/29/2020  . Thoracic aortic aneurysm ([place]) 02/29/2020  . Hypotensive episode 02/29/2020  . Obesity (BMI 30.0-34.9) 02/29/2020  . Lung nodule 02/19/2020  . Former smoker 02/19/2020  . Healthcare maintenance 02/19/2020  . Acoustic neuroma ([place]) 09/11/2017  . Asymmetrical sensorineural hearing loss 09/11/2017  . Encounter for cardioversion procedure   . Anticoagulation adequate 06/06/2016  . Fatigue 10/27/2015  . Bradycardia 10/27/2015  . Hyperlipidemia LDL goal <70 05/20/2015  . OSA on CPAP 07/23/2013  . Excessive daytime sleepiness 07/23/2013  . Hypertension   . Hyperlipidemia   . Diabetes mellitus   . Chest pain 08/18/2012  . Tobacco abuse 08/18/2012  . Unstable angina, cath [facility] 02/27/12 02/26/2012  . CAD, multiple prior RCA PCI's. Last cath 2010, Myoview low risk Oct 2012 02/26/2012  . IDDM (insulin dependent diabetes mellitus) 02/26/2012  . HTN (hypertension) 02/26/2012  . Dyslipidemia, low HDL. Intol to fish oil and Noacin 02/26/2012  . Sleep apnea, on C-pap 02/26/2012  . COPD (chronic obstructive pulmonary disease) ([place]) 02/26/2012  . Smoking, quit one week ago 02/26/2012  . GERD (gastroesophageal  reflux disease) 02/26/2012    Past Surgical History:  Procedure Laterality Date  . BACK SURGERY  1985  . CARDIAC CATHETERIZATION  2010   6 stents total  . CARDIAC CATHETERIZATION  01/2000   percutaneous transluminal coronary balloon angioplasty of mid RCA stenotic lesion  . CARDIAC CATHETERIZATION  06/2006   no stenting; ischemic cardiomyopathy, EF 40-45%  . CARDIOVERSION N/A 07/28/2016   Procedure: CARDIOVERSION;  Surgeon:  Troy Sine, MD;  Location: [place];  Service: Cardiovascular;  Laterality: N/A;  . CORONARY ANGIOPLASTY  09/1998   mid-distal RCA balloon dilatation, 4.5 & 5.0 stents   . CORONARY ANGIOPLASTY WITH STENT PLACEMENT  03/1994   angioplasty & stenting (non-DES) of circumflex/prox ramus intermedius  . CORONARY ANGIOPLASTY WITH STENT PLACEMENT  10/1994   large iliac [id number] stent to RCA  . CORONARY ANGIOPLASTY WITH STENT PLACEMENT  12/2002   4.35mm stents x2 of RCA  . CORONARY ANGIOPLASTY WITH STENT PLACEMENT  01/2005   cutting balloon arthrectomy of distal RCA & Cypher DES 3.5x13; cutting balloon arthrectomy of mid RCA with Cypher DES 3.5x18  . CORONARY ANGIOPLASTY WITH STENT PLACEMENT  11/2008   stenting of mid RCA with 4.0x52mm driver, non-DES  . INTRAVASCULAR PRESSURE WIRE/FFR STUDY N/A 03/02/2020   Procedure: INTRAVASCULAR PRESSURE WIRE/FFR STUDY;  Surgeon: Leonie Man, MD;  Location: [place] CV LAB;  Service: Cardiovascular;  Laterality: N/A;  . LEFT HEART CATH AND CORONARY ANGIOGRAPHY N/A 03/02/2020   Procedure: LEFT HEART CATH AND CORONARY ANGIOGRAPHY;  Surgeon: Leonie Man, MD;  Location: [place] CV LAB;  Service: Cardiovascular;  Laterality: N/A;  . LEFT HEART CATHETERIZATION WITH CORONARY ANGIOGRAM N/A 02/27/2012   Procedure: LEFT HEART CATHETERIZATION WITH CORONARY ANGIOGRAM;  Surgeon: Lorretta Harp, MD;  Location: [facility] CATH LAB;  Service: Cardiovascular;  Laterality: N/A;  . TRANSTHORACIC ECHOCARDIOGRAM  07/29/2010   EF 50=55%, mod inf wall hypokinesis & mild post wall hypokinesis; LA mild-mod dilated; mild mitral annular calcif & mild MR; mild TR & elevated RV systolic pressure; AV mildly sclerotic; mild aortic root dilatation        Family History  Problem Relation Age of Onset  . Heart attack Father     Social History   Tobacco Use  . Smoking status: Former Smoker    Packs/day: 1.00    Years: 50.00    Pack years: 50.00    Types: Cigarettes    Quit date:  07/20/2016    Years since quitting: 4.0  . Smokeless tobacco: Never Used  Substance Use Topics  . Alcohol use: Not Currently    Alcohol/week: 0.0 standard drinks  . Drug use: No    Home Medications Prior to Admission medications   Medication Sig Start Date End Date Taking? Authorizing Provider  albuterol (VENTOLIN HFA) 108 (90 Base) MCG/ACT inhaler Inhale 2 puffs into the lungs every 6 (six) hours as needed for wheezing or shortness of breath. 06/14/20   Garner Nash, DO  aspirin 81 MG tablet Take 81 mg by mouth at bedtime.     [provider]  ELIQUIS 5 MG TABS tablet TAKE 1 TABLET BY MOUTH TWICE A DAY 04/23/20   Troy Sine, MD  Fluticasone Furoate (ARNUITY ELLIPTA) 200 MCG/ACT AEPB Inhale 1 puff into the lungs daily. 07/21/20   Icard, Octavio Graves, DO  insulin aspart (NOVOLOG FLEXPEN) 100 UNIT/ML FlexPen INJECT 5-20 UNITS SUBCUTANEOUSLY WITH LUNCH AND DINNER 06/16/20   [name], MD  Insulin Pen Needle (NOVOFINE) 32G  X 6 MM MISC 1 each by Other route daily. 08/19/19   [name], MD  JARDIANCE 25 MG TABS tablet TAKE 1 TABLET BY MOUTH EVERY DAY 06/02/20   [place], Modena Nunnery, MD  LEVEMIR FLEXTOUCH 100 UNIT/ML FlexPen INJECT 46 UNITS INTO THE SKIN DAILY. DX:E11.9 04/23/20   [name], MD  montelukast (SINGULAIR) 10 MG tablet TAKE 1 TABLET BY MOUTH EVERY DAY Patient taking differently: Take 10 mg by mouth in the morning. 09/23/19   [name], MD  nebivolol (BYSTOLIC) 2.5 MG tablet Take 1 tablet (2.5 mg total) by mouth daily. 10/01/19   Troy Sine, MD  nitroGLYCERIN (NITROSTAT) 0.4 MG SL tablet PLACE 1 TABLET (0.4 MG TOTAL) UNDER THE TONGUE EVERY 5 (FIVE) MINUTES AS NEEDED FOR CHEST PAIN. 07/27/20   Troy Sine, MD  [facility] ULTRA test strip USE TO CHECK BLOOD SUGAR 3 TIMES A DAY (E11.9) 07/16/20   [name], MD  rosuvastatin (CRESTOR) 20 MG tablet TAKE 1 TABLET BY MOUTH EVERY DAY 06/21/20   Troy Sine, MD  sacubitril-valsartan (ENTRESTO)  24-26 MG Take 1 tablet by mouth 2 (two) times daily. 03/02/20   Isaiah Serge, NP    Allergies    Benazepril, Fish allergy, and Omega-3 fatty acids  Review of Systems   Review of Systems  All other systems reviewed and are negative.   Physical Exam Updated Vital Signs BP 108/73   Pulse (!) 52   Temp 98.1 F (36.7 C) (Oral)   Resp 20   Ht 6' (1.829 m)   Wt 100.7 kg   SpO2 95%   BMI 30.11 kg/m   Physical Exam Vitals and nursing note reviewed.  Constitutional:      General: He is not in acute distress.    Appearance: He is well-developed and well-nourished. He is obese. He is not ill-appearing, toxic-appearing or diaphoretic.  HENT:     Head: Normocephalic and atraumatic.     Right Ear: External ear normal.     Left Ear: External ear normal.  Eyes:     Extraocular Movements: EOM normal.     Conjunctiva/sclera: Conjunctivae normal.     Pupils: Pupils are equal, round, and reactive to light.  Neck:     Trachea: Phonation normal.  Cardiovascular:     Rate and Rhythm: Tachycardia present.     Heart sounds: Normal heart sounds.     Comments: Pulse palpated, right wrist well heart rate 100, perfusing about 2 out of 3 beats. Pulmonary:     Effort: Pulmonary effort is normal. No respiratory distress.     Breath sounds: Normal breath sounds. No stridor.  Chest:     Chest wall: No bony tenderness.  Abdominal:     General: There is no distension.     Palpations: Abdomen is soft.     Tenderness: There is no abdominal tenderness.  Musculoskeletal:        General: Normal range of motion.     Cervical back: Normal range of motion and neck supple.  Skin:    General: Skin is warm, dry and intact.  Neurological:     Mental Status: He is alert and oriented to person, place, and time.     Cranial Nerves: No cranial nerve deficit.     Sensory: No sensory deficit.     Motor: No abnormal muscle tone.     Coordination: Coordination normal.  Psychiatric:        Mood and Affect:  Mood and affect and mood normal.        Behavior: Behavior normal.        Thought Content: Thought content normal.        Judgment: Judgment normal.     ED Results / Procedures / Treatments   Labs (all labs ordered are listed, but only abnormal results are displayed) Labs Reviewed  COMPREHENSIVE METABOLIC PANEL - Abnormal; Notable for the following components:      Result Value   Glucose, Bld 137 (*)    BUN 29 (*)    Creatinine, Ser 1.30 (*)    Total Protein 6.2 (*)    GFR, Estimated 59 (*)    All other components within normal limits  CBC WITH DIFFERENTIAL/PLATELET - Abnormal; Notable for the following components:   RBC 5.83 (*)    Hemoglobin 17.7 (*)    HCT 55.4 (*)    All other components within normal limits  TROPONIN I (HIGH SENSITIVITY) - Abnormal; Notable for the following components:   Troponin I (High Sensitivity) 184 (*)    All other components within normal limits  RESP PANEL BY RT-PCR (FLU A&B, COVID) ARPGX2  MAGNESIUM  HEPARIN LEVEL (UNFRACTIONATED)  CBC  TROPONIN I (HIGH SENSITIVITY)    EKG EKG Interpretation  Date/Time:  Wednesday August 18 2020 20:21:43 EST Ventricular Rate:  201 PR Interval:    QRS Duration: 177 QT Interval:  280 QTC Calculation: 512 R Axis:   -131 Text Interpretation: Extreme tachycardia with wide complex, no further rhythm analysis attempted v-tach Since last tracing Confirmed by Daleen Bo [phone]) on 08/18/2020 9:23:33 PM   EKG Interpretation  Date/Time:  Wednesday August 18 2020 20:50:18 EST Ventricular Rate:  72 PR Interval:    QRS Duration: 128 QT Interval:  386 QTC Calculation: 282 R Axis:   36 Text Interpretation: Sinus rhythm Ventricular bigeminy Short PR interval Consider left atrial enlargement Nonspecific intraventricular conduction delay ST elevation inferiorly Posterior infarct, acute (LCx) Abnormal lateral Q waves >>> Acute MI <<< Since last tracing rate slower and frequent PVC Confirmed by Daleen Bo [phone])  on 08/18/2020 9:25:13 PM       EKG Interpretation  Date/Time:  Wednesday August 18 2020 20:52:02 EST Ventricular Rate:  50 PR Interval:    QRS Duration: 160 QT Interval:  418 QTC Calculation: 382 R Axis:   82 Text Interpretation: Sinus rhythm LVH with secondary repolarization abnormality Inferior infarct, old Inferior ST elevation and anterior ST depression Since last tracing 2 min. ago, No significant change was found Confirmed by Daleen Bo [phone]) on 08/18/2020 9:27:40 PM         Radiology No results found.  Procedures .Cardioversion  Date/Time: 08/18/2020 9:32 PM Performed by: Daleen Bo, MD Authorized by: Daleen Bo, MD   Consent:    Consent obtained:  Verbal   Consent given by:  Patient   Risks discussed:  Death (Aspiration, CVA)   Alternatives discussed:  Rate-control medication, alternative treatment and referral Universal protocol:    Immediately prior to procedure a time out was called: yes     Patient identity confirmed:  Verbally with patient Pre-procedure details:    Cardioversion basis:  Emergent   Rhythm:  Ventricular tachycardia Patient sedated: Yes. Refer to sedation procedure documentation for details of sedation.  Attempt one:    Cardioversion mode:  Synchronous   Waveform:  Biphasic   Shock (Joules):  120   Shock outcome:  No change in rhythm Attempt two:    Cardioversion mode:  Synchronous   Waveform:  Biphasic   Shock (Joules):  200   Shock outcome:  No change in rhythm Attempt three:    Cardioversion mode:  Synchronous   Waveform:  Biphasic   Shock (Joules):  200   Shock outcome:  No change in rhythm Post-procedure details:    Patient status:  Unresponsive   Patient tolerance of procedure:  Tolerated well, no immediate complications Comments:     Immediately proceeded with amiodarone push, 300 mg following third cardioversion.  No immediate improvement so added lidocaine 100 mg.  Within a few minutes of the lidocaine dosing, he  converted to sinus bradycardia.   .Sedation  Date/Time: 08/18/2020 9:35 PM Performed by: Daleen Bo, MD Authorized by: Daleen Bo, MD   Consent:    Consent obtained:  Verbal Universal protocol:    Immediately prior to procedure, a time out was called: yes     Patient identity confirmed:  Arm band and verbally with patient Indications:    Procedure performed:  Cardioversion Pre-sedation assessment:    Time since last food or drink:  5 hours   ASA classification: class 3 - patient with severe systemic disease     Mouth opening:  3 or more finger widths   Mallampati score:  II - soft palate, uvula, fauces visible   Neck mobility: normal     Pre-sedation assessments completed and reviewed: airway patency     Pre-sedation assessment completed:  08/18/2020 8:35 PM Immediate pre-procedure details:    Reassessment: Patient reassessed immediately prior to procedure     Reviewed: vital signs     Verified: bag valve mask available, emergency equipment available, intubation equipment available, IV patency confirmed and oxygen available   Procedure details (see MAR for exact dosages):    Sedation:  Etomidate   Intended level of sedation: deep   Intra-procedure monitoring:  Blood pressure monitoring, cardiac monitor, frequent LOC assessments and frequent vital sign checks   Intra-procedure events: airway compromise     Intra-procedure management:  BVM ventilation   Total Provider sedation time (minutes):  25 Post-procedure details:    Post-sedation assessment completed:  08/18/2020 9:20 PM   Attendance: Constant attendance by certified staff until patient recovered     Recovery: Patient returned to pre-procedure baseline     Post-sedation assessments completed and reviewed: airway patency, cardiovascular function, hydration status and mental status     Post-sedation assessments completed and reviewed: post-procedure nausea and vomiting status not reviewed, pain score not reviewed and  post-procedure respiratory function not reviewed     Procedure completion:  Tolerated well, no immediate complications .Critical Care Performed by: Daleen Bo, MD Authorized by: Daleen Bo, MD   Critical care provider statement:    Critical care time (minutes):  75   Critical care start time:  08/18/2020 8:23 PM   Critical care end time:  08/18/2020 9:38 PM   Critical care time was exclusive of:  Separately billable procedures and treating other patients   Critical care was necessary to treat or prevent imminent or life-threatening deterioration of the following conditions:  Cardiac failure   Critical care was time spent personally by me on the following activities:  Blood draw for specimens, development of treatment plan with patient or surrogate, discussions with consultants, evaluation of patient's response to treatment, examination of patient, obtaining history from patient or surrogate, ordering and performing treatments and interventions, ordering and review of laboratory studies, pulse oximetry, re-evaluation of patient's condition, review of old charts and ordering  and review of radiographic studies     Medications Ordered in ED Medications  amiodarone (NEXTERONE PREMIX) 360-4.14 MG/200ML-% (1.8 mg/mL) IV infusion (has no administration in time range)  amiodarone (NEXTERONE PREMIX) 360-4.14 MG/200ML-% (1.8 mg/mL) IV infusion (has no administration in time range)  heparin ADULT infusion 100 units/mL (25000 units/261mL) (has no administration in time range)  sodium chloride 0.9 % bolus 500 mL (0 mLs Intravenous Stopped 08/18/20 2056)  etomidate (AMIDATE) 2 MG/ML injection (  Given 08/18/20 2106)  aspirin suppository 300 mg (300 mg Rectal Given 08/18/20 2056)  heparin sodium (porcine) injection 4,000 Units (4,000 Units Intravenous Given 08/18/20 2056)  sodium chloride 0.9 % bolus 1,000 mL (0 mLs Intravenous Stopped 08/18/20 2203)  sodium chloride 0.9 % bolus 1,000 mL (0 mLs Intravenous  Stopped 08/18/20 2203)  etomidate (AMIDATE) injection 15 mg (15 mg Intravenous Given 08/18/20 2041)  amiodarone (CORDARONE) injection 300 mg (300 mg Intravenous Given 08/18/20 2045)  lidocaine (cardiac) 100 mg/58mL (XYLOCAINE) injection 2% 15.2 mg (15.2 mg Intravenous Given 08/18/20 2047)    ED Course  I have reviewed the triage vital signs and the nursing notes.  Pertinent labs & imaging results that were available during my care of the patient were reviewed by me and considered in my medical decision making (see chart for details).    MDM Rules/Calculators/A&P HEAR Score: 7                         Patient Vitals for the past 24 hrs:  BP Temp Temp src Pulse Resp SpO2 Height Weight  08/18/20 2200 108/73 -- -- (!) 52 20 95 % -- --  08/18/20 2110 104/83 -- -- (!) 55 (!) 28 99 % -- --  08/18/20 2100 (!) 89/66 -- -- (!) 51 (!) 24 98 % -- --  08/18/20 2045 99/61 -- -- (!) 53 17 100 % -- --  08/18/20 2030 99/77 -- -- (!) 101 (!) 22 98 % -- --  08/18/20 2020 (!) 84/66 98.1 F (36.7 C) Oral (!) 211 18 100 % 6' (1.829 m) 100.7 kg    9:18 PM Reevaluation with update and discussion. After initial assessment and treatment, an updated evaluation reveals patient is now awake after etomidate sedation and cardioversion.  He denies chest pain at this time.  He states he feels "tired."  Patient son in the ED room with the patient, who has been apprised of the situation, the need for urgent transfer.  EMS is now rating the patient for transfer to [facility] emergency department. Daleen Bo   Medical Decision Making:  This patient is presenting for evaluation of chest pain, ongoing and intermittent, which does require a range of treatment options, and is a complaint that involves a high risk of morbidity and mortality. The differential diagnoses include ACS, PE, metabolic disorder. I decided to review old records, and in summary [place] male history of coronary disease and stenting presenting with chest pain,  and ventricular tachycardia.  I obtained additional historical information from son who came after cardioversion.  Clinical Laboratory Tests Ordered, included CBC, Metabolic panel and Troponin, COVID test. Review indicates initial troponin elevated, hemoglobin elevated, glucose high, BUN high, creatinine high, total protein low.   Cardiac Monitor Tracing which shows initially ventricular tachycardia, followed by sinus bradycardia     Critical Interventions-clinical evaluation, cardiac monitor, EKG, consideration for urgent cardioversion, laboratory testing, cardioversion, additional medication to control tachyarrhythmia, amiodarone bolus and lidocaine, heparin and aspirin, consultation  with cardiology and interventional cardiology, discussion with transferring paramedics, from [facility].  Coordination of care with [facility] team.  Dr. Tamala Julian, interventional cardiologist will evaluate the patient at the [facility] emergency department for consideration of intervention or medical care.  After These Interventions, the Patient was reevaluated and was found improved, awake after sedation and cardioversion, not complaining of chest pain.  Patient did not require support with any ongoing infusions, during transport.  Patient is being transferred as an emergency patient, for higher level of care, to [facility] emergency department  CRITICAL CARE-yes Performed by: Daleen Bo  Nursing Notes Reviewed/ Care Coordinated Applicable Imaging Reviewed Interpretation of Laboratory Data incorporated into ED treatment   Plan-transfer for cardiac care.  Critically stable at time of transfer    Final Clinical Impression(s) / ED Diagnoses Final diagnoses:  Ventricular tachycardia ([place])  Acute ST elevation myocardial infarction (STEMI) involving right coronary artery [facility])    Rx / DC Orders ED Discharge Orders    None       Daleen Bo, MD 08/18/20 2221

## 2020-08-18 NOTE — Progress Notes (Signed)
Patient bagged during procedure when his respiratory rate slowed down. No adverse reactions noted and patient maintained sats in the upper 90's to 100%.

## 2020-08-18 NOTE — ED Notes (Addendum)
Pt in [place]  Dr. Eulis Foster at bedside  Timeout at 2039  15 of Etomidate given at 2041  Shocked at 2042 120 J  No rhythm change   Shocked again at 200 J @2043   No rhythm change   Shocked again 200 J @2044   No rhythm change   300 of Amiodarone given at 2045  100 of lidocaine given @ 2047  Rhythm change at 2050 with HR of 51  Blood pressure 103/63

## 2020-08-18 NOTE — ED Notes (Signed)
EMS arrived for transport.

## 2020-08-19 ENCOUNTER — Other Ambulatory Visit: Payer: Self-pay | Admitting: Family Medicine

## 2020-08-19 ENCOUNTER — Encounter (HOSPITAL_COMMUNITY): Payer: Self-pay | Admitting: Cardiovascular Disease

## 2020-08-19 ENCOUNTER — Inpatient Hospital Stay (HOSPITAL_COMMUNITY)
Admission: EM | Disposition: A | Discharge status: Home / Self Care | Payer: Self-pay | Source: Home / Self Care | Attending: Cardiovascular Disease

## 2020-08-19 ENCOUNTER — Other Ambulatory Visit: Payer: Self-pay | Admitting: Cardiovascular Disease

## 2020-08-19 DIAGNOSIS — I214 Non-ST elevation (NSTEMI) myocardial infarction: Principal | ICD-10-CM

## 2020-08-19 DIAGNOSIS — I472 Ventricular tachycardia: Secondary | ICD-10-CM

## 2020-08-19 HISTORY — PX: LEFT HEART CATH AND CORONARY ANGIOGRAPHY: CATH118249

## 2020-08-19 LAB — CBC
HCT: 50.3 % (ref 39.0–52.0)
Hemoglobin: 16 g/dL (ref 13.0–17.0)
MCH: 30 pg (ref 26.0–34.0)
MCHC: 31.8 g/dL (ref 30.0–36.0)
MCV: 94.4 fL (ref 80.0–100.0)
Platelets: 124 10*3/uL — ABNORMAL LOW (ref 150–400)
RBC: 5.33 MIL/uL (ref 4.22–5.81)
RDW: 13.6 % (ref 11.5–15.5)
WBC: 6.3 10*3/uL (ref 4.0–10.5)
nRBC: 0 % (ref 0.0–0.2)

## 2020-08-19 LAB — APTT
aPTT: 64 seconds — ABNORMAL HIGH (ref 24–36)
aPTT: 45 seconds — ABNORMAL HIGH (ref 24–36)

## 2020-08-19 LAB — GLUCOSE, CAPILLARY
Glucose-Capillary: 100 mg/dL — ABNORMAL HIGH (ref 70–99)
Glucose-Capillary: 180 mg/dL — ABNORMAL HIGH (ref 70–99)
Glucose-Capillary: 179 mg/dL — ABNORMAL HIGH (ref 70–99)
Glucose-Capillary: 143 mg/dL — ABNORMAL HIGH (ref 70–99)

## 2020-08-19 LAB — TROPONIN I (HIGH SENSITIVITY)
Troponin I (High Sensitivity): 172 ng/L (ref <18)
Troponin I (High Sensitivity): 142 ng/L (ref <18)
Troponin I (High Sensitivity): 165 ng/L (ref <18)

## 2020-08-19 LAB — HEMOGLOBIN A1C
Hgb A1c MFr Bld: 7.3 % — ABNORMAL HIGH (ref 4.8–5.6)
Mean Plasma Glucose: 162.81 mg/dL

## 2020-08-19 LAB — MRSA PCR SCREENING: MRSA by PCR: NEGATIVE

## 2020-08-19 LAB — CBG MONITORING, ED: Glucose-Capillary: 110 mg/dL — ABNORMAL HIGH (ref 70–99)

## 2020-08-19 LAB — TSH: TSH: 0.913 u[IU]/mL (ref 0.350–4.500)

## 2020-08-19 SURGERY — LEFT HEART CATH AND CORONARY ANGIOGRAPHY
Anesthesia: LOCAL

## 2020-08-19 MED ORDER — SODIUM CHLORIDE 0.9 % IV SOLN
250.0000 mL | INTRAVENOUS | Status: DC | PRN
  Administered 2020-08-21: 250 mL via INTRAVENOUS

## 2020-08-19 MED ORDER — ASPIRIN 81 MG PO TABS: 81.0000 mg | ORAL_TABLET | Freq: Every day | ORAL | Status: DC

## 2020-08-19 MED ORDER — ORAL CARE MOUTH RINSE
15.0000 mL | Freq: Two times a day (BID) | OROMUCOSAL | Status: DC
  Administered 2020-08-19 – 2020-08-20 (×4): 15 mL via OROMUCOSAL

## 2020-08-19 MED ORDER — MORPHINE SULFATE (PF) 2 MG/ML IV SOLN: 2.0000 mg | INTRAVENOUS | Status: DC | PRN

## 2020-08-19 MED ORDER — SODIUM CHLORIDE 0.9% FLUSH
3.0000 mL | INTRAVENOUS | Status: DC | PRN
Start: 2020-08-19 — End: 2020-08-19

## 2020-08-19 MED ORDER — VERAPAMIL HCL 2.5 MG/ML IV SOLN
INTRA_ARTERIAL | Status: DC | PRN
  Administered 2020-08-19: 3 mL via INTRA_ARTERIAL

## 2020-08-19 MED ORDER — FENTANYL CITRATE (PF) 100 MCG/2ML IJ SOLN
INTRAMUSCULAR | Status: AC
  Filled 2020-08-19: qty 2

## 2020-08-19 MED ORDER — HEPARIN SODIUM (PORCINE) 1000 UNIT/ML IJ SOLN
INTRAMUSCULAR | Status: DC | PRN
  Administered 2020-08-19: 5000 [IU] via INTRAVENOUS

## 2020-08-19 MED ORDER — ROSUVASTATIN CALCIUM 20 MG PO TABS
20.0000 mg | ORAL_TABLET | Freq: Every day | ORAL | Status: DC
  Administered 2020-08-19 – 2020-08-21 (×3): 20 mg via ORAL
  Filled 2020-08-19 (×3): qty 1

## 2020-08-19 MED ORDER — NITROGLYCERIN 0.4 MG SL SUBL: 0.4000 mg | SUBLINGUAL_TABLET | SUBLINGUAL | Status: DC | PRN

## 2020-08-19 MED ORDER — ASPIRIN 81 MG PO CHEW
81.0000 mg | CHEWABLE_TABLET | ORAL | Status: AC
  Administered 2020-08-19: 81 mg via ORAL
  Filled 2020-08-19: qty 1

## 2020-08-19 MED ORDER — ACETAMINOPHEN 325 MG PO TABS: 650.0000 mg | ORAL_TABLET | ORAL | Status: DC | PRN

## 2020-08-19 MED ORDER — HEPARIN (PORCINE) IN NACL 1000-0.9 UT/500ML-% IV SOLN
INTRAVENOUS | Status: DC | PRN
  Administered 2020-08-19 (×2): 500 mL

## 2020-08-19 MED ORDER — LIDOCAINE HCL (PF) 1 % IJ SOLN
INTRAMUSCULAR | Status: DC | PRN
  Administered 2020-08-19: 2 mL

## 2020-08-19 MED ORDER — VERAPAMIL HCL 2.5 MG/ML IV SOLN: INTRA_ARTERIAL | Status: DC | PRN

## 2020-08-19 MED ORDER — ASPIRIN EC 81 MG PO TBEC: 81.0000 mg | DELAYED_RELEASE_TABLET | Freq: Every day | ORAL | Status: DC

## 2020-08-19 MED ORDER — ONDANSETRON HCL 4 MG/2ML IJ SOLN: 4.0000 mg | Freq: Four times a day (QID) | INTRAMUSCULAR | Status: DC | PRN

## 2020-08-19 MED ORDER — SODIUM CHLORIDE 0.9% FLUSH: 3.0000 mL | INTRAVENOUS | Status: DC | PRN

## 2020-08-19 MED ORDER — SODIUM CHLORIDE 0.9% FLUSH
3.0000 mL | Freq: Two times a day (BID) | INTRAVENOUS | Status: DC
  Administered 2020-08-19 – 2020-08-20 (×5): 3 mL via INTRAVENOUS
  Administered 2020-08-21: 10 mL via INTRAVENOUS

## 2020-08-19 MED ORDER — LABETALOL HCL 5 MG/ML IV SOLN: 10.0000 mg | INTRAVENOUS | Status: AC | PRN

## 2020-08-19 MED ORDER — SODIUM CHLORIDE 0.9 % IV SOLN: INTRAVENOUS | Status: AC

## 2020-08-19 MED ORDER — HYDRALAZINE HCL 20 MG/ML IJ SOLN: 10.0000 mg | INTRAMUSCULAR | Status: AC | PRN

## 2020-08-19 MED ORDER — EMPAGLIFLOZIN 25 MG PO TABS
25.0000 mg | ORAL_TABLET | Freq: Every day | ORAL | Status: DC
  Administered 2020-08-19 – 2020-08-21 (×3): 25 mg via ORAL
  Filled 2020-08-19 (×3): qty 1

## 2020-08-19 MED ORDER — HEPARIN SODIUM (PORCINE) 1000 UNIT/ML IJ SOLN
INTRAMUSCULAR | Status: AC
  Filled 2020-08-19: qty 1

## 2020-08-19 MED ORDER — SODIUM CHLORIDE 0.9 % WEIGHT BASED INFUSION
1.0000 mL/kg/h | INTRAVENOUS | Status: DC
  Administered 2020-08-19: 1 mL/kg/h via INTRAVENOUS

## 2020-08-19 MED ORDER — ASPIRIN 81 MG PO CHEW: 81.0000 mg | CHEWABLE_TABLET | Freq: Every day | ORAL | Status: DC

## 2020-08-19 MED ORDER — MONTELUKAST SODIUM 10 MG PO TABS
10.0000 mg | ORAL_TABLET | Freq: Every morning | ORAL | Status: DC
  Administered 2020-08-20 – 2020-08-21 (×2): 10 mg via ORAL
  Filled 2020-08-19 (×2): qty 1

## 2020-08-19 MED ORDER — METOPROLOL TARTRATE 12.5 MG HALF TABLET
12.5000 mg | ORAL_TABLET | Freq: Two times a day (BID) | ORAL | Status: DC
  Administered 2020-08-19 – 2020-08-20 (×3): 12.5 mg via ORAL
  Filled 2020-08-19 (×3): qty 1

## 2020-08-19 MED ORDER — HEPARIN (PORCINE) IN NACL 1000-0.9 UT/500ML-% IV SOLN
INTRAVENOUS | Status: AC
  Filled 2020-08-19: qty 1500

## 2020-08-19 MED ORDER — HEPARIN (PORCINE) 25000 UT/250ML-% IV SOLN
1550.0000 [IU]/h | INTRAVENOUS | Status: DC
  Administered 2020-08-19: 1450 [IU]/h via INTRAVENOUS
  Filled 2020-08-19 (×2): qty 250

## 2020-08-19 MED ORDER — CHLORHEXIDINE GLUCONATE CLOTH 2 % EX PADS
6.0000 | MEDICATED_PAD | Freq: Every day | CUTANEOUS | Status: DC
  Administered 2020-08-19 – 2020-08-20 (×2): 6 via TOPICAL

## 2020-08-19 MED ORDER — VERAPAMIL HCL 2.5 MG/ML IV SOLN
INTRAVENOUS | Status: AC
  Filled 2020-08-19: qty 2

## 2020-08-19 MED ORDER — BUDESONIDE 0.5 MG/2ML IN SUSP
0.5000 mg | Freq: Two times a day (BID) | RESPIRATORY_TRACT | Status: DC
  Administered 2020-08-19 – 2020-08-21 (×4): 0.5 mg via RESPIRATORY_TRACT
  Filled 2020-08-19 (×5): qty 2

## 2020-08-19 MED ORDER — SODIUM CHLORIDE 0.9 % IV SOLN: 250.0000 mL | INTRAVENOUS | Status: DC | PRN

## 2020-08-19 MED ORDER — SODIUM CHLORIDE 0.9 % WEIGHT BASED INFUSION
3.0000 mL/kg/h | INTRAVENOUS | Status: DC
  Administered 2020-08-19: 3 mL/kg/h via INTRAVENOUS

## 2020-08-19 MED ORDER — ACETAMINOPHEN 325 MG PO TABS
650.0000 mg | ORAL_TABLET | ORAL | Status: DC | PRN
  Administered 2020-08-20 – 2020-08-21 (×4): 650 mg via ORAL
  Filled 2020-08-19 (×4): qty 2

## 2020-08-19 MED ORDER — LIDOCAINE HCL (PF) 1 % IJ SOLN
INTRAMUSCULAR | Status: AC
  Filled 2020-08-19: qty 30

## 2020-08-19 MED ORDER — FLUTICASONE FUROATE 200 MCG/ACT IN AEPB: 1.0000 | INHALATION_SPRAY | Freq: Every day | RESPIRATORY_TRACT | Status: DC

## 2020-08-19 MED ORDER — MIDAZOLAM HCL 2 MG/2ML IJ SOLN
INTRAMUSCULAR | Status: AC
  Filled 2020-08-19: qty 2

## 2020-08-19 MED ORDER — NITROGLYCERIN 1 MG/10 ML FOR IR/CATH LAB
INTRA_ARTERIAL | Status: AC
  Filled 2020-08-19: qty 10

## 2020-08-19 MED ORDER — SODIUM CHLORIDE 0.9% FLUSH
3.0000 mL | Freq: Two times a day (BID) | INTRAVENOUS | Status: DC
  Administered 2020-08-19: 3 mL via INTRAVENOUS

## 2020-08-19 MED ORDER — IOHEXOL 350 MG/ML SOLN
INTRAVENOUS | Status: DC | PRN
  Administered 2020-08-19: 50 mL

## 2020-08-19 MED ORDER — ATORVASTATIN CALCIUM 80 MG PO TABS: 80.0000 mg | ORAL_TABLET | Freq: Every day | ORAL | Status: DC

## 2020-08-19 SURGICAL SUPPLY — 14 items
CATH OPTITORQUE TIG 4.0 5F (CATHETERS) ×1 IMPLANT
CATH SWAN GANZ 7F STRAIGHT (CATHETERS) IMPLANT
GLIDESHEATH SLEND A-KIT 6F 22G (SHEATH) ×1 IMPLANT
GUIDEWIRE INQWIRE 1.5J.035X260 (WIRE) IMPLANT
INQWIRE 1.5J .035X260CM (WIRE) ×2
KIT HEART LEFT (KITS) ×2 IMPLANT
PACK CARDIAC CATHETERIZATION (CUSTOM PROCEDURE TRAY) ×2 IMPLANT
SHEATH PINNACLE 5F 10CM (SHEATH) IMPLANT
SHEATH PINNACLE 7F 10CM (SHEATH) IMPLANT
SHEATH PROBE COVER 6X72 (BAG) ×1 IMPLANT
TRANSDUCER W/STOPCOCK (MISCELLANEOUS) ×2 IMPLANT
TUBING CIL FLEX 10 FLL-RA (TUBING) ×2 IMPLANT
WIRE EMERALD 3MM-J .035X150CM (WIRE) IMPLANT
WIRE HI TORQ VERSACORE-J 145CM (WIRE) ×1 IMPLANT

## 2020-08-19 NOTE — H&P (View-Only) (Signed)
Progress Note  Patient Name: John [name] Date of Encounter: 08/19/2020  [facility] Cardiologist: Shelva Majestic, MD   Subjective   No CP or dyspnea  Inpatient Medications    Scheduled Meds: . [START ON 08/20/2020] aspirin EC  81 mg Oral Daily  . budesonide (PULMICORT) nebulizer solution  0.5 mg Nebulization BID  . Chlorhexidine Gluconate Cloth  6 each Topical Daily  . empagliflozin  25 mg Oral Daily  . montelukast  10 mg Oral q AM  . rosuvastatin  20 mg Oral Daily  . sodium chloride flush  3 mL Intravenous Q12H   Continuous Infusions: . sodium chloride    . sodium chloride 1 mL/kg/hr (08/19/20 0700)  . amiodarone    . heparin 1,250 Units/hr (08/19/20 0700)   PRN Meds: sodium chloride, acetaminophen, nitroGLYCERIN, ondansetron (ZOFRAN) IV, sodium chloride flush   Vital Signs    Vitals:   08/19/20 0400 08/19/20 0500 08/19/20 0600 08/19/20 0700  BP: 121/63 109/73 110/72 125/77  Pulse: (!) 50 (!) 57 (!) 56 61  Resp: 19 16 19 15   Temp:      TempSrc:      SpO2: 96% 98% 97% 100%  Weight:      Height:        Intake/Output Summary (Last 24 hours) at 08/19/2020 0723 Last data filed at 08/19/2020 0700 Gross per 24 hour  Intake 1176.71 ml  Output 550 ml  Net 626.71 ml   Last 3 Weights 08/18/2020 08/02/2020 03/12/2020  Weight (lbs) 222 lb 223 lb 4 oz 222 lb 12.8 oz  Weight (kg) 100.699 kg 101.266 kg 101.061 kg      Telemetry    Sinus bradycardia with PVCs and couplet- Personally Reviewed    Physical Exam   GEN: No acute distress.   Neck: No JVD Cardiac: RRR, no murmurs, rubs, or gallops.  Respiratory: Clear to auscultation bilaterally. GI: Soft, nontender, non-distended  MS: No edema Neuro:  Nonfocal  Psych: Normal affect   Labs    High Sensitivity Troponin:   Recent Labs  Lab 08/18/20 2040 08/18/20 2208 08/19/20 0026  TROPONINIHS 184* 221* 172*      Chemistry Recent Labs  Lab 08/18/20 2040  NA 139  K 3.7  CL 106  CO2 23  GLUCOSE 137*   BUN 29*  CREATININE 1.30*  CALCIUM 9.1  PROT 6.2*  ALBUMIN 3.7  AST 28  ALT 26  ALKPHOS 62  BILITOT 1.0  GFRNONAA 59*  ANIONGAP 10     Hematology Recent Labs  Lab 08/18/20 2040 08/19/20 0456  WBC 9.4 6.3  RBC 5.83* 5.33  HGB 17.7* 16.0  HCT 55.4* 50.3  MCV 95.0 94.4  MCH 30.4 30.0  MCHC 31.9 31.8  RDW 13.6 13.6  PLT 170 124*    Patient Profile     70 y.o. male with past medical history of coronary artery disease, ischemic cardiomyopathy, hypertension, hyperlipidemia, diabetes mellitus, interstitial lung disease, history of paroxysmal atrial flutter with previous cardioversion, thoracic aortic aneurysm admitted with chest pain and monomorphic ventricular tachycardia. Echo 9/21 showed EF 45-50, severe LAE mildly dilated aortic root.  Cardiac catheterization September 2021 showed 55% proximal circumflex, 60% sidebranch in first obtuse marginal with negative FFR, 30% mid LAD, 55% right coronary artery with negative FFR.  Medical therapy recommended.  Assessment & Plan    1 Ventricular tachycardia-patient remains in sinus rhythm this morning status post cardioversion.  I personally reviewed the patient's electrocardiogram which demonstrated monomorphic ventricular tachycardia.  Amiodarone  has been discontinued due to bradycardia.  Plan cardiac catheterization today to rule out recurrent coronary disease though given monomorphic ventricular tachycardia likely related to previous scar. The risk and benefits of cath including myocardial infarction, CVA and death discussed and he agrees to proceed.    Note initial troponin I 184 but follow-up 221 and 172 (not consistent with acute infarct).  Likely demand ischemia in the setting of ventricular tachycardia.  We will arrange echocardiogram to reassess LV function as well.  We will also ask electrophysiology to review.  2 coronary artery disease-continue aspirin and statin.  3 history of atrial flutter-patient is in sinus rhythm this  morning.  Will resume apixaban once all procedures complete.  Continue IV heparin.  4 ischemic cardiomyopathy-LV function mildly reduced on most recent echocardiogram.  Blood pressure is borderline.  Will resume Entresto and nebivolol later as blood pressure allows.  5 hyperlipidemia-continue heparin.  For questions or updates, please contact [place] Please consult [email or web address] for contact info under        Signed, Kirk Ruths, MD  08/19/2020, 7:23 AM

## 2020-08-19 NOTE — Progress Notes (Signed)
ANTICOAGULATION CONSULT NOTE - Follow Up Consult  Pharmacy Consult for heparin Indication: chest pain/ACS and atrial fibrillation  Labs: Recent Labs    08/18/20 2040 08/18/20 2208 08/19/20 0026 08/19/20 0456  HGB 17.7*  --   --  16.0  HCT 55.4*  --   --  50.3  PLT 170  --   --  124*  APTT  --   --   --  64*  CREATININE 1.30*  --   --   --   TROPONINIHS 184* 221* 172*  --     Assessment: 69yo male subtherapeutic on heparin with initial dosing while Eliquis on hold.  Goal of Therapy:  aPTT 66-102 seconds   Plan:  Will increase heparin gtt by 1 unit/kg/hr to 1350 units/hr and check PTT in 6 hours.    Wynona Neat, PharmD, BCPS  08/19/2020,6:52 AM

## 2020-08-19 NOTE — Progress Notes (Signed)
[place] for heparin Indication: chest pain/ACS  Allergies  Allergen Reactions   Benazepril Other (See Comments)    hyperkalemia   Fish Allergy Itching   Omega-3 Fatty Acids Hives and Itching    Patient Measurements: Height: 6' (182.9 cm) Weight: 100.7 kg (222 lb) IBW/kg (Calculated) : 77.6 Heparin Dosing Weight:  98 kg  Vital Signs: BP: 122/67 (03/03 0910) Pulse Rate: 60 (03/03 0910)  Labs: Recent Labs    08/18/20 2040 08/18/20 2208 08/19/20 0026 08/19/20 0456 08/19/20 0555  HGB 17.7*  --   --  16.0  --   HCT 55.4*  --   --  50.3  --   PLT 170  --   --  124*  --   APTT  --   --   --  64*  --   CREATININE 1.30*  --   --   --   --   TROPONINIHS 184*   < > 172* 142* 165*   < > = values in this interval not displayed.    Estimated Creatinine Clearance: 64 mL/min (A) (by C-G formula based on SCr of 1.3 mg/dL (H)).   Medical History: Past Medical History:  Diagnosis Date   Atrial flutter ([place])    s/p cardioversion   Coronary artery disease    Diabetes mellitus    GERD (gastroesophageal reflux disease)    History of nuclear stress test 04/04/2011   lexiscan; mod-large in size fixed inferolateral defect (scar); non-diagnostic for ischemia; low risk scan    Hyperlipidemia    Hypertension    Left foot drop    r/t past disk srugery - uses Kevlar brace   Myocardial infarction ([facility])    posterior MI   Shortness of breath    Sleep apnea    on CPAP; 04/28/2007 split-night - AHI during total sleep 44.43/hr and REM 72.56/hr    Medications:  Medications Prior to Admission  Medication Sig Dispense Refill Last Dose   albuterol (VENTOLIN HFA) 108 (90 Base) MCG/ACT inhaler Inhale 2 puffs into the lungs every 6 (six) hours as needed for wheezing or shortness of breath. 1 each 6 Past Week at Unknown time   aspirin 81 MG tablet Take 81 mg by mouth at bedtime.    08/17/2020 at Unknown time   ELIQUIS 5 MG TABS tablet TAKE 1  TABLET BY MOUTH TWICE A DAY (Patient taking differently: Take 5 mg by mouth 2 (two) times daily.) 60 tablet 5 08/18/2020 at 0530   Fluticasone Furoate (ARNUITY ELLIPTA) 200 MCG/ACT AEPB Inhale 1 puff into the lungs daily. 1 each 3 08/18/2020 at Unknown time   insulin aspart (NOVOLOG FLEXPEN) 100 UNIT/ML FlexPen INJECT 5-20 UNITS SUBCUTANEOUSLY WITH LUNCH AND DINNER (Patient taking differently: Inject 5-20 Units into the skin See admin instructions. LUNCH AND DINNER) 15 mL 1 Past Week at Unknown time   Insulin Pen Needle (NOVOFINE) 32G X 6 MM MISC 1 each by Other route daily. 100 each 3    JARDIANCE 25 MG TABS tablet TAKE 1 TABLET BY MOUTH EVERY DAY (Patient taking differently: Take 25 mg by mouth daily.) 30 tablet 5 08/18/2020 at Unknown time   LEVEMIR FLEXTOUCH 100 UNIT/ML FlexPen INJECT 46 UNITS INTO THE SKIN DAILY. DX:E11.9 (Patient taking differently: Inject 46 Units into the skin at bedtime.) 15 mL 3 08/17/2020 at Unknown time   montelukast (SINGULAIR) 10 MG tablet TAKE 1 TABLET BY MOUTH EVERY DAY (Patient taking differently: Take 10 mg by mouth in  the morning.) 90 tablet 3 08/18/2020 at Unknown time   nebivolol (BYSTOLIC) 2.5 MG tablet Take 1 tablet (2.5 mg total) by mouth daily. 90 tablet 3 08/18/2020 at Unknown time   nitroGLYCERIN (NITROSTAT) 0.4 MG SL tablet PLACE 1 TABLET (0.4 MG TOTAL) UNDER THE TONGUE EVERY 5 (FIVE) MINUTES AS NEEDED FOR CHEST PAIN. 25 tablet 1 Past Week at Unknown time   ONETOUCH ULTRA test strip USE TO CHECK BLOOD SUGAR 3 TIMES A DAY (E11.9) 100 strip 2    rosuvastatin (CRESTOR) 20 MG tablet TAKE 1 TABLET BY MOUTH EVERY DAY (Patient taking differently: Take 20 mg by mouth at bedtime.) 90 tablet 3 08/17/2020 at Unknown time   sacubitril-valsartan (ENTRESTO) 49-51 MG Take 1 tablet by mouth 2 (two) times daily.   08/18/2020 at Unknown time   sacubitril-valsartan (ENTRESTO) 24-26 MG Take 1 tablet by mouth 2 (two) times daily. (Patient not taking: No sig reported) 60 tablet 6 Not  Taking at Unknown time    Assessment: 36 YOM who presented to the ED at [facility] with chest pain and found to be in [place] requiring multiple shocks. Pharmacy consulted to start IV heparin for ACS event. Of note, patient is on Eliquis at home.  Pt s/p LHC this morning with stable CAD, pharmacy to restart heparin in 4h after sheath removal.  Goal of Therapy:  Heparin level 0.3-0.7 units/ml aPTT 66-102 seconds Monitor platelets by anticoagulation protocol: Yes   Plan:  -Restart heparin 1350 units/h no bolus at 1300 -Check 6h aPTT  Arrie Senate, PharmD, BCPS, [facility] Clinical Pharmacist [phone] Please check AMION for all [place] numbers 08/19/2020

## 2020-08-19 NOTE — Progress Notes (Signed)
[place] for heparin Indication: chest pain/ACS > Afib  Allergies  Allergen Reactions  . Benazepril Other (See Comments)    hyperkalemia  . Fish Allergy Itching  . Omega-3 Fatty Acids Hives and Itching    Patient Measurements: Height: 6' (182.9 cm) Weight: 100.7 kg (222 lb) IBW/kg (Calculated) : 77.6 Heparin Dosing Weight:  98 kg  Vital Signs: Temp: 97.8 F (36.6 C) (03/03 1504) Temp Source: Oral (03/03 1504) BP: 128/82 (03/03 1900) Pulse Rate: 70 (03/03 1900)  Labs: Recent Labs    08/18/20 2040 08/18/20 2208 08/19/20 0026 08/19/20 0456 08/19/20 0555 08/19/20 1834  HGB 17.7*  --   --  16.0  --   --   HCT 55.4*  --   --  50.3  --   --   PLT 170  --   --  124*  --   --   APTT  --   --   --  64*  --  45*  CREATININE 1.30*  --   --   --   --   --   TROPONINIHS 184*   < > 172* 142* 165*  --    < > = values in this interval not displayed.    Estimated Creatinine Clearance: 64 mL/min (A) (by C-G formula based on SCr of 1.3 mg/dL (H)).   Medical History: Past Medical History:  Diagnosis Date  . Atrial flutter ([place])    s/p cardioversion  . Coronary artery disease   . Diabetes mellitus   . GERD (gastroesophageal reflux disease)   . History of nuclear stress test 04/04/2011   lexiscan; mod-large in size fixed inferolateral defect (scar); non-diagnostic for ischemia; low risk scan   . Hyperlipidemia   . Hypertension   . Left foot drop    r/t past disk srugery - uses Kevlar brace  . Myocardial infarction ([place])    posterior MI  . Shortness of breath   . Sleep apnea    on CPAP; 04/28/2007 split-night - AHI during total sleep 44.43/hr and REM 72.56/hr    Medications:  Medications Prior to Admission  Medication Sig Dispense Refill Last Dose  . albuterol (VENTOLIN HFA) 108 (90 Base) MCG/ACT inhaler Inhale 2 puffs into the lungs every 6 (six) hours as needed for wheezing or shortness of breath. 1 each 6 Past Week at Unknown time   . aspirin 81 MG tablet Take 81 mg by mouth at bedtime.    08/17/2020 at Unknown time  . ELIQUIS 5 MG TABS tablet TAKE 1 TABLET BY MOUTH TWICE A DAY (Patient taking differently: Take 5 mg by mouth 2 (two) times daily.) 60 tablet 5 08/18/2020 at 0530  . Fluticasone Furoate (ARNUITY ELLIPTA) 200 MCG/ACT AEPB Inhale 1 puff into the lungs daily. 1 each 3 08/18/2020 at Unknown time  . insulin aspart (NOVOLOG FLEXPEN) 100 UNIT/ML FlexPen INJECT 5-20 UNITS SUBCUTANEOUSLY WITH LUNCH AND DINNER (Patient taking differently: Inject 5-20 Units into the skin See admin instructions. LUNCH AND DINNER) 15 mL 1 Past Week at Unknown time  . Insulin Pen Needle (NOVOFINE) 32G X 6 MM MISC 1 each by Other route daily. 100 each 3   . JARDIANCE 25 MG TABS tablet TAKE 1 TABLET BY MOUTH EVERY DAY (Patient taking differently: Take 25 mg by mouth daily.) 30 tablet 5 08/18/2020 at Unknown time  . LEVEMIR FLEXTOUCH 100 UNIT/ML FlexPen INJECT 46 UNITS INTO THE SKIN DAILY. DX:E11.9 (Patient taking differently: Inject 46 Units into the skin  at bedtime.) 15 mL 3 08/17/2020 at Unknown time  . nitroGLYCERIN (NITROSTAT) 0.4 MG SL tablet PLACE 1 TABLET (0.4 MG TOTAL) UNDER THE TONGUE EVERY 5 (FIVE) MINUTES AS NEEDED FOR CHEST PAIN. 25 tablet 1 Past Week at Unknown time  . ONETOUCH ULTRA test strip USE TO CHECK BLOOD SUGAR 3 TIMES A DAY (E11.9) 100 strip 2   . rosuvastatin (CRESTOR) 20 MG tablet TAKE 1 TABLET BY MOUTH EVERY DAY (Patient taking differently: Take 20 mg by mouth at bedtime.) 90 tablet 3 08/17/2020 at Unknown time  . sacubitril-valsartan (ENTRESTO) 49-51 MG Take 1 tablet by mouth 2 (two) times daily.   08/18/2020 at Unknown time  . sacubitril-valsartan (ENTRESTO) 24-26 MG Take 1 tablet by mouth 2 (two) times daily. (Patient not taking: No sig reported) 60 tablet 6 Not Taking at Unknown time    Assessment: 35 YOM who presented to the ED at [facility] with chest pain and found to be in [place] requiring multiple shocks. Pharmacy consulted to start IV  heparin for ACS event. Of note, patient is on Eliquis at home.  Pt s/p LHC this morning with stable CAD, pharmacy to restart heparin in 4h after sheath removal. Currently on 1350 units/hr of IV heparin. APTT this evening is subtherapeutic at 45s. No bleeding noted per RN  Goal of Therapy:  Heparin level 0.3-0.7 units/ml aPTT 66-102 seconds Monitor platelets by anticoagulation protocol: Yes   Plan:  -Increase heparin to 1450 units/h with no bolus -Check 6h aPTT/HL -For ICD implant tomorrow  Albertina Parr, PharmD., BCPS, BCCCP Clinical Pharmacist Please refer to [facility] for unit-specific pharmacist

## 2020-08-19 NOTE — ED Notes (Signed)
Dr Radford Pax called to return call

## 2020-08-19 NOTE — ED Notes (Signed)
Rep;ort given to rn on 2h

## 2020-08-19 NOTE — Consult Note (Signed)
Cardiology Consultation:   Patient ID: John [name] MRN: [id number]; DOB: [birth year]  Admit date: 08/18/2020 Date of Consult: 08/19/2020  PCP:  [name], MD   [place]  Cardiologist:  Shelva Majestic, MD  Electrophysiologist:  None    Patient Profile:   John [name] is a 70 y.o. male with a hx of CAD(multiple prior PCIs reported in the RCA and Cx, last stenting in RCA 2010), ICM, HTN, HLD, DM, OSA (w/CPAP), TAA, ILD, quit smoking 2019, AFlutter, back surgery resulting in L foot drop, chronic back pain,  who is being seen today for the evaluation of VT at the request of Dr. Stanford Breed.   History of Present Illness:   John [name] saught attention at [facility] with CP, palpitations, weakness, and SOB, all progressive for a day of so increased palpitations, once he decided to go to the hospital on the way in he did mention to his wife he felt so weak he may faint. He reports in the last month or so his palpitations have had a clear up-tik in frequency and duration.   He was found in [facility] felt to be c/w VT , hypotensive and cardioverted in the ER. (1020J, 200J, 200J)  He was also given amio bolus and gtt as well as lidocaine bolus He was transferred to [facility] for further management Notes report that the amiodarone was stopped 2/2 bradycardia.  Labs largely unremarkable K+ 3.7 Mag 2.1 BUN/Creat 29/1.30 HS Trop 184, 221, 172, 142, 165 WBC 6.3 H/H 16/50 Plts 124 TSH 0.913  He underwent cath this AM with no new or obstructive CAD, updated echo is pending, previousl LVEF last year 45-50%, described as having global hypokinesis   2018 Pulmonary function studies have shown restriction with low DLCO.  He felt possibly also to have asthma based on nitric oxide testing.  With his lung disease it has been recommended that amiodarone be considered for discontinuance Looks like amio was stopped in 2019 Follows with pulmonology   Past Medical History:   Diagnosis Date  . Atrial flutter ([place])    s/p cardioversion  . Coronary artery disease   . Diabetes mellitus   . GERD (gastroesophageal reflux disease)   . History of nuclear stress test 04/04/2011   lexiscan; mod-large in size fixed inferolateral defect (scar); non-diagnostic for ischemia; low risk scan   . Hyperlipidemia   . Hypertension   . Left foot drop    r/t past disk srugery - uses Kevlar brace  . Myocardial infarction ([place])    posterior MI  . Shortness of breath   . Sleep apnea    on CPAP; 04/28/2007 split-night - AHI during total sleep 44.43/hr and REM 72.56/hr    Past Surgical History:  Procedure Laterality Date  . BACK SURGERY  1985  . CARDIAC CATHETERIZATION  2010   6 stents total  . CARDIAC CATHETERIZATION  01/2000   percutaneous transluminal coronary balloon angioplasty of mid RCA stenotic lesion  . CARDIAC CATHETERIZATION  06/2006   no stenting; ischemic cardiomyopathy, EF 40-45%  . CARDIOVERSION N/A 07/28/2016   Procedure: CARDIOVERSION;  Surgeon: Troy Sine, MD;  Location: [place];  Service: Cardiovascular;  Laterality: N/A;  . CORONARY ANGIOPLASTY  09/1998   mid-distal RCA balloon dilatation, 4.5 & 5.0 stents   . CORONARY ANGIOPLASTY WITH STENT PLACEMENT  03/1994   angioplasty & stenting (non-DES) of circumflex/prox ramus intermedius  . CORONARY ANGIOPLASTY WITH STENT PLACEMENT  10/1994   large iliac [id number]  stent to RCA  . CORONARY ANGIOPLASTY WITH STENT PLACEMENT  12/2002   4.70mm stents x2 of RCA  . CORONARY ANGIOPLASTY WITH STENT PLACEMENT  01/2005   cutting balloon arthrectomy of distal RCA & Cypher DES 3.5x13; cutting balloon arthrectomy of mid RCA with Cypher DES 3.5x18  . CORONARY ANGIOPLASTY WITH STENT PLACEMENT  11/2008   stenting of mid RCA with 4.0x42mm driver, non-DES  . INTRAVASCULAR PRESSURE WIRE/FFR STUDY N/A 03/02/2020   Procedure: INTRAVASCULAR PRESSURE WIRE/FFR STUDY;  Surgeon: Leonie Man, MD;  Location: [place] [facility];   Service: Cardiovascular;  Laterality: N/A;  . LEFT HEART CATH AND CORONARY ANGIOGRAPHY N/A 03/02/2020   Procedure: LEFT HEART CATH AND CORONARY ANGIOGRAPHY;  Surgeon: Leonie Man, MD;  Location: [place] [facility];  Service: Cardiovascular;  Laterality: N/A;  . LEFT HEART CATHETERIZATION WITH CORONARY ANGIOGRAM N/A 02/27/2012   Procedure: LEFT HEART CATHETERIZATION WITH CORONARY ANGIOGRAM;  Surgeon: Lorretta Harp, MD;  Location: [facility] CATH LAB;  Service: Cardiovascular;  Laterality: N/A;  . TRANSTHORACIC ECHOCARDIOGRAM  07/29/2010   EF 50=55%, mod inf wall hypokinesis & mild post wall hypokinesis; LA mild-mod dilated; mild mitral annular calcif & mild MR; mild TR & elevated RV systolic pressure; AV mildly sclerotic; mild aortic root dilatation      Home Medications:  Prior to Admission medications   Medication Sig Start Date End Date Taking? Authorizing Provider  albuterol (VENTOLIN HFA) 108 (90 Base) MCG/ACT inhaler Inhale 2 puffs into the lungs every 6 (six) hours as needed for wheezing or shortness of breath. 06/14/20  Yes Icard, Leory Plowman L, DO  aspirin 81 MG tablet Take 81 mg by mouth at bedtime.    Yes [provider]  ELIQUIS 5 MG TABS tablet TAKE 1 TABLET BY MOUTH TWICE A DAY Patient taking differently: Take 5 mg by mouth 2 (two) times daily. 04/23/20  Yes Troy Sine, MD  Fluticasone Furoate (ARNUITY ELLIPTA) 200 MCG/ACT AEPB Inhale 1 puff into the lungs daily. 07/21/20  Yes Icard, Bradley L, DO  insulin aspart (NOVOLOG FLEXPEN) 100 UNIT/ML FlexPen INJECT 5-20 UNITS SUBCUTANEOUSLY WITH LUNCH AND DINNER Patient taking differently: Inject 5-20 Units into the skin See admin instructions. LUNCH AND DINNER 06/16/20  Yes [name], MD  Insulin Pen Needle (NOVOFINE) 32G X 6 MM MISC 1 each by Other route daily. 08/19/19  Yes PickardCammie Mcgee, MD  JARDIANCE 25 MG TABS tablet TAKE 1 TABLET BY MOUTH EVERY DAY Patient taking differently: Take 25 mg by mouth daily. 06/02/20  Yes  [place], Modena Nunnery, MD  LEVEMIR FLEXTOUCH 100 UNIT/ML FlexPen INJECT 46 UNITS INTO THE SKIN DAILY. DX:E11.9 Patient taking differently: Inject 46 Units into the skin at bedtime. 04/23/20  Yes [name], MD  nebivolol (BYSTOLIC) 2.5 MG tablet Take 1 tablet (2.5 mg total) by mouth daily. 10/01/19  Yes Troy Sine, MD  nitroGLYCERIN (NITROSTAT) 0.4 MG SL tablet PLACE 1 TABLET (0.4 MG TOTAL) UNDER THE TONGUE EVERY 5 (FIVE) MINUTES AS NEEDED FOR CHEST PAIN. 07/27/20  Yes Troy Sine, MD  [facility] ULTRA test strip USE TO CHECK BLOOD SUGAR 3 TIMES A DAY (E11.9) 07/16/20  Yes [name], MD  rosuvastatin (CRESTOR) 20 MG tablet TAKE 1 TABLET BY MOUTH EVERY DAY Patient taking differently: Take 20 mg by mouth at bedtime. 06/21/20  Yes Troy Sine, MD  sacubitril-valsartan (ENTRESTO) 49-51 MG Take 1 tablet by mouth 2 (two) times daily.   Yes [provider]  montelukast (SINGULAIR) 10 MG tablet TAKE 1 TABLET BY MOUTH EVERY DAY 08/19/20   [name], MD  sacubitril-valsartan (ENTRESTO) 24-26 MG Take 1 tablet by mouth 2 (two) times daily. Patient not taking: No sig reported 03/02/20   Isaiah Serge, NP    Inpatient Medications: Scheduled Meds: . [START ON 08/20/2020] aspirin EC  81 mg Oral Daily  . budesonide (PULMICORT) nebulizer solution  0.5 mg Nebulization BID  . Chlorhexidine Gluconate Cloth  6 each Topical Daily  . empagliflozin  25 mg Oral Daily  . mouth rinse  15 mL Mouth Rinse BID  . montelukast  10 mg Oral q AM  . rosuvastatin  20 mg Oral Daily  . sodium chloride flush  3 mL Intravenous Q12H   Continuous Infusions: . sodium chloride 75 mL/hr at 08/19/20 1100  . sodium chloride    . heparin     PRN Meds: sodium chloride, acetaminophen, hydrALAZINE, labetalol, morphine injection, nitroGLYCERIN, ondansetron (ZOFRAN) IV, sodium chloride flush  Allergies:    Allergies  Allergen Reactions  . Benazepril Other (See Comments)    hyperkalemia  . Fish Allergy  Itching  . Omega-3 Fatty Acids Hives and Itching    Social History:   Social History   Socioeconomic History  . Marital status: Married    Spouse name: Not on file  . Number of children: 3  . Years of education: Not on file  . Highest education level: Not on file  Occupational History  . 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  Tobacco Use  . Smoking status: Former Smoker    Packs/day: 1.00    Years: 50.00    Pack years: 50.00    Types: Cigarettes    Quit date: 07/20/2016    Years since quitting: 4.0  . Smokeless tobacco: Never Used  Substance and Sexual Activity  . Alcohol use: Not Currently    Alcohol/week: 0.0 standard drinks  . Drug use: No  . Sexual activity: Yes  Other Topics Concern  . Not on file  Social History Narrative  . Not on file   Social Determinants of Health   Financial Resource Strain: Not on file  Food Insecurity: Not on file  Transportation Needs: Not on file  Physical Activity: Not on file  Stress: Not on file  Social Connections: Not on file  Intimate Partner Violence: Not on file    Family History:   Family History  Problem Relation Age of Onset  . Heart attack Father      ROS:  Please see the history of present illness.  All other ROS reviewed and negative.     Physical Exam/Data:   Vitals:   08/19/20 0945 08/19/20 1000 08/19/20 1100 08/19/20 1107  BP: 116/67 115/84 115/69   Pulse: (!) 54 (!) 56 61   Resp: 16 15 20    Temp:    97.9 F (36.6 C)  TempSrc:    Oral  SpO2: 92% 95% 98%   Weight:      Height:        Intake/Output Summary (Last 24 hours) at 08/19/2020 1142 Last data filed at 08/19/2020 1100 Gross per 24 hour  Intake 1747.68 ml  Output 650 ml  Net 1097.68 ml   Last 3 Weights 08/18/2020 08/02/2020 03/12/2020  Weight (lbs) 222 lb 223 lb 4 oz 222 lb 12.8 oz  Weight (kg) 100.699 kg 101.266 kg 101.061 kg     Body  mass index is 30.11 kg/m.  General:  Well nourished, well  developed, in no acute distress HEENT: normal Lymph: no adenopathy Neck: no JVD Endocrine:  No thryomegaly Vascular: No carotid bruits  Cardiac:  RRR; no murmurs, gallops or rubs Lungs:  CTA b/l, no wheezing, rhonchi or rales  Abd: soft, nontender Ext: no edema Musculoskeletal:  No deformities Skin: warm and dry  Neuro:  no focal abnormalities noted Psych:  Normal affect   EKG:  The EKG was personally reviewed and demonstrates:    #1 is WCT 201bpm, irregular in a bigeminal pattern, RBBB, transitions V3, LAD #2 SR, V bigemeny, 72bpm SB 56bpm, PVCs, , IVCD,  SB 53bpm, , lat T changes, PVC  Telemetry:  Telemetry was personally reviewed and demonstrates:   SB/SR 50's-60's, frequent PVCs, some couplets, few 3 beat salvos  Relevant CV Studies:   08/19/20: LHC  Prox RCA to Dist RCA lesion is 15% stenosed.  Ost RCA to Prox RCA lesion is 40% stenosed.  Ramus lesion is 60% stenosed.  Dist RCA lesion is 45% stenosed. Mr. Towell has known disease and underwent diagnostic coronary angiography by Dr. Ellyn Hack in September 2021.  He had noncritical CAD with moderate proximal RCA and ramus branch stenosis both of which were shown to be not physiologically significant by DFR.  He presented with chest pain and monomorphic VT at 220 bpm.  His enzymes rose mildly suspect related to demand ischemia.  His anatomy is unchanged from his previous cath.  His EKG on presentation suggested "scar VT".  And EP evaluation would be warranted.  Continue medical therapy is recommended.  The sheath was removed and a TR band was placed on the right wrist to achieve patent hemostasis.  The patient did receive his Eliquis last dose yesterday morning.  Heparin will be restarted 4 hours after sheath removal without a bolus.  The patient left lab in stable condition. [name]. MD, [facility] 08/19/2020 9:04 AM      03/01/2020: TTE IMPRESSIONS 1. Left ventricular ejection fraction, by estimation, is 45 to 50%. The   left ventricle has mildly decreased function. The left ventricle  demonstrates global hypokinesis. Left ventricular diastolic parameters are  indeterminate.  2. Right ventricular systolic function is normal. The right ventricular  size is normal.  3. Left atrial size was severely dilated.  4. The mitral valve is normal in structure. Trivial mitral valve  regurgitation. No evidence of mitral stenosis.  5. The aortic valve is tricuspid. Aortic valve regurgitation is not  visualized. No aortic stenosis is present.  6. Aortic dilatation noted. There is mild dilatation of the aortic root,  measuring 41 mm.  7. The inferior vena cava is dilated in size with >50% respiratory  variability, suggesting right atrial pressure of 8 mmHg.     05/06/2015 nuclear stress test with an ejection fraction of 31%.  There was a large fixed inferior and inferoseptal wall defect suggestive of scar and evidence for inferoseptal akinesis.  There was no ischemia  Laboratory Data:  High Sensitivity Troponin:   Recent Labs  Lab 08/18/20 2040 08/18/20 2208 08/19/20 0026 08/19/20 0456 08/19/20 0555  TROPONINIHS 184* 221* 172* 142* 165*     Chemistry Recent Labs  Lab 08/18/20 2040  NA 139  K 3.7  CL 106  CO2 23  GLUCOSE 137*  BUN 29*  CREATININE 1.30*  CALCIUM 9.1  GFRNONAA 59*  ANIONGAP 10    Recent Labs  Lab 08/18/20 2040  PROT 6.2*  ALBUMIN  3.7  AST 28  ALT 26  ALKPHOS 62  BILITOT 1.0   Hematology Recent Labs  Lab 08/18/20 2040 08/19/20 0456  WBC 9.4 6.3  RBC 5.83* 5.33  HGB 17.7* 16.0  HCT 55.4* 50.3  MCV 95.0 94.4  MCH 30.4 30.0  MCHC 31.9 31.8  RDW 13.6 13.6  PLT 170 124*   BNPNo results for input(s): BNP, PROBNP in the last 168 hours.  DDimer No results for input(s): DDIMER in the last 168 hours.   Radiology/Studies:      Assessment and Plan:   1. WCT     Most c/w VT, he has hx of AFlutter, though do not suspect this given his high V ectopy burden and  presentation.     Most likely scar mediated VT  Dr. Quentin Ore has seen and examined the patient. discussed ICD recommendation and rational for it (will also provide brady pacing which he will need with meds) Patient is agreeable, given he had heparin today and sedation, not felt today is ideal for the procedure or ability to consent him. And will plan for tomorrow if the schedule allows, pt understands, there is a possibility may be Monday.  No eliquis today please SCDs for DVT prophylaxis   Historically seems he has been on amiodarone for his AFlutter and found with abn PFTs, ILD and amio ultimately stopped If this is not a long term option for him, will hold off gtt for now and will discuss with Dr. Quentin Ore further, perhaps try to maximize BB once device is in.   Risk Assessment/Risk Scores:  {  For questions or updates, please contact [place] Please consult [email or web address] for contact info under    Signed, Baldwin [place], PA-C  08/19/2020 11:43 AM

## 2020-08-19 NOTE — ED Notes (Signed)
The pt reports that the pain he had is almost gone

## 2020-08-19 NOTE — Progress Notes (Signed)
Progress Note  Patient Name: John [name] Date of Encounter: 08/19/2020  [facility] Cardiologist: Shelva Majestic, MD   Subjective   No CP or dyspnea  Inpatient Medications    Scheduled Meds: . [START ON 08/20/2020] aspirin EC  81 mg Oral Daily  . budesonide (PULMICORT) nebulizer solution  0.5 mg Nebulization BID  . Chlorhexidine Gluconate Cloth  6 each Topical Daily  . empagliflozin  25 mg Oral Daily  . montelukast  10 mg Oral q AM  . rosuvastatin  20 mg Oral Daily  . sodium chloride flush  3 mL Intravenous Q12H   Continuous Infusions: . sodium chloride    . sodium chloride 1 mL/kg/hr (08/19/20 0700)  . amiodarone    . heparin 1,250 Units/hr (08/19/20 0700)   PRN Meds: sodium chloride, acetaminophen, nitroGLYCERIN, ondansetron (ZOFRAN) IV, sodium chloride flush   Vital Signs    Vitals:   08/19/20 0400 08/19/20 0500 08/19/20 0600 08/19/20 0700  BP: 121/63 109/73 110/72 125/77  Pulse: (!) 50 (!) 57 (!) 56 61  Resp: 19 16 19 15   Temp:      TempSrc:      SpO2: 96% 98% 97% 100%  Weight:      Height:        Intake/Output Summary (Last 24 hours) at 08/19/2020 0723 Last data filed at 08/19/2020 0700 Gross per 24 hour  Intake 1176.71 ml  Output 550 ml  Net 626.71 ml   Last 3 Weights 08/18/2020 08/02/2020 03/12/2020  Weight (lbs) 222 lb 223 lb 4 oz 222 lb 12.8 oz  Weight (kg) 100.699 kg 101.266 kg 101.061 kg      Telemetry    Sinus bradycardia with PVCs and couplet- Personally Reviewed    Physical Exam   GEN: No acute distress.   Neck: No JVD Cardiac: RRR, no murmurs, rubs, or gallops.  Respiratory: Clear to auscultation bilaterally. GI: Soft, nontender, non-distended  MS: No edema Neuro:  Nonfocal  Psych: Normal affect   Labs    High Sensitivity Troponin:   Recent Labs  Lab 08/18/20 2040 08/18/20 2208 08/19/20 0026  TROPONINIHS 184* 221* 172*      Chemistry Recent Labs  Lab 08/18/20 2040  NA 139  K 3.7  CL 106  CO2 23  GLUCOSE 137*   BUN 29*  CREATININE 1.30*  CALCIUM 9.1  PROT 6.2*  ALBUMIN 3.7  AST 28  ALT 26  ALKPHOS 62  BILITOT 1.0  GFRNONAA 59*  ANIONGAP 10     Hematology Recent Labs  Lab 08/18/20 2040 08/19/20 0456  WBC 9.4 6.3  RBC 5.83* 5.33  HGB 17.7* 16.0  HCT 55.4* 50.3  MCV 95.0 94.4  MCH 30.4 30.0  MCHC 31.9 31.8  RDW 13.6 13.6  PLT 170 124*    Patient Profile     70 y.o. male with past medical history of coronary artery disease, ischemic cardiomyopathy, hypertension, hyperlipidemia, diabetes mellitus, interstitial lung disease, history of paroxysmal atrial flutter with previous cardioversion, thoracic aortic aneurysm admitted with chest pain and monomorphic ventricular tachycardia. Echo 9/21 showed EF 45-50, severe LAE mildly dilated aortic root.  Cardiac catheterization September 2021 showed 55% proximal circumflex, 60% sidebranch in first obtuse marginal with negative FFR, 30% mid LAD, 55% right coronary artery with negative FFR.  Medical therapy recommended.  Assessment & Plan    1 Ventricular tachycardia-patient remains in sinus rhythm this morning status post cardioversion.  I personally reviewed the patient's electrocardiogram which demonstrated monomorphic ventricular tachycardia.  Amiodarone  has been discontinued due to bradycardia.  Plan cardiac catheterization today to rule out recurrent coronary disease though given monomorphic ventricular tachycardia likely related to previous scar. The risk and benefits of cath including myocardial infarction, CVA and death discussed and he agrees to proceed.    Note initial troponin I 184 but follow-up 221 and 172 (not consistent with acute infarct).  Likely demand ischemia in the setting of ventricular tachycardia.  We will arrange echocardiogram to reassess LV function as well.  We will also ask electrophysiology to review.  2 coronary artery disease-continue aspirin and statin.  3 history of atrial flutter-patient is in sinus rhythm this  morning.  Will resume apixaban once all procedures complete.  Continue IV heparin.  4 ischemic cardiomyopathy-LV function mildly reduced on most recent echocardiogram.  Blood pressure is borderline.  Will resume Entresto and nebivolol later as blood pressure allows.  5 hyperlipidemia-continue heparin.  For questions or updates, please contact [place] Please consult [email or web address] for contact info under        Signed, Kirk Ruths, MD  08/19/2020, 7:23 AM

## 2020-08-19 NOTE — Interval H&P Note (Signed)
Cath Lab Visit (complete for each Cath Lab visit)  Clinical Evaluation Leading to the Procedure:   ACS: Yes.    Non-ACS:    Anginal Classification: CCS II  Anti-ischemic medical therapy: Minimal Therapy (1 class of medications)  Non-Invasive Test Results: No non-invasive testing performed  Prior CABG: No previous CABG      History and Physical Interval Note:  08/19/2020 8:13 AM  John [name]  has presented today for surgery, with the diagnosis of NSTEMI.  The various methods of treatment have been discussed with the patient and family. After consideration of risks, benefits and other options for treatment, the patient has consented to  Procedure(s): LEFT HEART CATH AND CORONARY ANGIOGRAPHY (N/A) as a surgical intervention.  The patient's history has been reviewed, patient examined, no change in status, stable for surgery.  I have reviewed the patient's chart and labs.  Questions were answered to the patient's satisfaction.     [name]

## 2020-08-19 NOTE — Plan of Care (Signed)

## 2020-08-20 ENCOUNTER — Encounter (HOSPITAL_COMMUNITY)
Admission: EM | Disposition: A | Discharge status: Home / Self Care | Payer: Self-pay | Source: Home / Self Care | Attending: Cardiovascular Disease

## 2020-08-20 ENCOUNTER — Ambulatory Visit: Payer: Medicare Other | Admitting: Podiatry

## 2020-08-20 DIAGNOSIS — I472 Ventricular tachycardia: Secondary | ICD-10-CM

## 2020-08-20 DIAGNOSIS — I255 Ischemic cardiomyopathy: Secondary | ICD-10-CM

## 2020-08-20 HISTORY — PX: ICD IMPLANT: EP1208

## 2020-08-20 LAB — CBC
HCT: 51.4 % (ref 39.0–52.0)
Hemoglobin: 16.1 g/dL (ref 13.0–17.0)
MCH: 29.8 pg (ref 26.0–34.0)
MCHC: 31.3 g/dL (ref 30.0–36.0)
MCV: 95.2 fL (ref 80.0–100.0)
Platelets: 113 10*3/uL — ABNORMAL LOW (ref 150–400)
RBC: 5.4 MIL/uL (ref 4.22–5.81)
RDW: 13.4 % (ref 11.5–15.5)
WBC: 6.8 10*3/uL (ref 4.0–10.5)
nRBC: 0 % (ref 0.0–0.2)

## 2020-08-20 LAB — SURGICAL PCR SCREEN
MRSA, PCR: NEGATIVE
Staphylococcus aureus: NEGATIVE

## 2020-08-20 LAB — HEPARIN LEVEL (UNFRACTIONATED): Heparin Unfractionated: 0.51 IU/mL (ref 0.30–0.70)

## 2020-08-20 LAB — BASIC METABOLIC PANEL
Anion gap: 6 (ref 5–15)
BUN: 21 mg/dL (ref 8–23)
CO2: 23 mmol/L (ref 22–32)
Calcium: 8.4 mg/dL — ABNORMAL LOW (ref 8.9–10.3)
Chloride: 112 mmol/L — ABNORMAL HIGH (ref 98–111)
Creatinine, Ser: 1.17 mg/dL (ref 0.61–1.24)
GFR, Estimated: >60 mL/min (ref >60)
Glucose, Bld: 166 mg/dL — ABNORMAL HIGH (ref 70–99)
Potassium: 4.1 mmol/L (ref 3.5–5.1)
Sodium: 141 mmol/L (ref 135–145)

## 2020-08-20 LAB — GLUCOSE, CAPILLARY
Glucose-Capillary: 142 mg/dL — ABNORMAL HIGH (ref 70–99)
Glucose-Capillary: 106 mg/dL — ABNORMAL HIGH (ref 70–99)
Glucose-Capillary: 83 mg/dL (ref 70–99)
Glucose-Capillary: 131 mg/dL — ABNORMAL HIGH (ref 70–99)

## 2020-08-20 LAB — APTT: aPTT: 62 seconds — ABNORMAL HIGH (ref 24–36)

## 2020-08-20 SURGERY — ICD IMPLANT

## 2020-08-20 MED ORDER — SODIUM CHLORIDE 0.9 % IV SOLN
80.0000 mg | INTRAVENOUS | Status: AC
  Administered 2020-08-20: 80 mg
  Filled 2020-08-20: qty 2

## 2020-08-20 MED ORDER — HEPARIN (PORCINE) IN NACL 1000-0.9 UT/500ML-% IV SOLN
INTRAVENOUS | Status: DC | PRN
  Administered 2020-08-20: 500 mL

## 2020-08-20 MED ORDER — CEFAZOLIN SODIUM-DEXTROSE 2-4 GM/100ML-% IV SOLN
INTRAVENOUS | Status: AC
  Filled 2020-08-20: qty 100

## 2020-08-20 MED ORDER — FENTANYL CITRATE (PF) 100 MCG/2ML IJ SOLN
INTRAMUSCULAR | Status: DC | PRN
  Administered 2020-08-20 (×3): 25 ug via INTRAVENOUS

## 2020-08-20 MED ORDER — MIDAZOLAM HCL 5 MG/5ML IJ SOLN
INTRAMUSCULAR | Status: AC
  Filled 2020-08-20: qty 5

## 2020-08-20 MED ORDER — SODIUM CHLORIDE 0.9% FLUSH
3.0000 mL | Freq: Two times a day (BID) | INTRAVENOUS | Status: DC
  Administered 2020-08-20 (×2): 3 mL via INTRAVENOUS
  Administered 2020-08-21: 10 mL via INTRAVENOUS

## 2020-08-20 MED ORDER — LIDOCAINE HCL (PF) 1 % IJ SOLN
INTRAMUSCULAR | Status: DC | PRN
  Administered 2020-08-20: 60 mL

## 2020-08-20 MED ORDER — MIDAZOLAM HCL 5 MG/5ML IJ SOLN
INTRAMUSCULAR | Status: DC | PRN
  Administered 2020-08-20 (×3): 1 mg via INTRAVENOUS

## 2020-08-20 MED ORDER — ONDANSETRON HCL 4 MG/2ML IJ SOLN: 4.0000 mg | Freq: Four times a day (QID) | INTRAMUSCULAR | Status: DC | PRN

## 2020-08-20 MED ORDER — FENTANYL CITRATE (PF) 100 MCG/2ML IJ SOLN
INTRAMUSCULAR | Status: AC
  Filled 2020-08-20: qty 2

## 2020-08-20 MED ORDER — SODIUM CHLORIDE 0.9% FLUSH: 3.0000 mL | INTRAVENOUS | Status: DC | PRN

## 2020-08-20 MED ORDER — CEFAZOLIN SODIUM-DEXTROSE 2-4 GM/100ML-% IV SOLN
2.0000 g | INTRAVENOUS | Status: AC
  Administered 2020-08-20: 2 g via INTRAVENOUS
  Filled 2020-08-20: qty 100

## 2020-08-20 MED ORDER — CHLORHEXIDINE GLUCONATE 4 % EX LIQD
60.0000 mL | Freq: Once | CUTANEOUS | Status: AC
  Administered 2020-08-20: 4 via TOPICAL
  Filled 2020-08-20: qty 15

## 2020-08-20 MED ORDER — METOPROLOL SUCCINATE ER 100 MG PO TB24
100.0000 mg | ORAL_TABLET | Freq: Every day | ORAL | Status: DC
  Administered 2020-08-20 – 2020-08-21 (×2): 100 mg via ORAL
  Filled 2020-08-20 (×3): qty 1

## 2020-08-20 MED ORDER — LIDOCAINE HCL (PF) 1 % IJ SOLN
INTRAMUSCULAR | Status: AC
  Filled 2020-08-20: qty 60

## 2020-08-20 MED ORDER — SODIUM CHLORIDE 0.9 % IV SOLN
INTRAVENOUS | Status: AC
  Filled 2020-08-20: qty 2

## 2020-08-20 MED ORDER — CEFAZOLIN SODIUM-DEXTROSE 1-4 GM/50ML-% IV SOLN
1.0000 g | Freq: Four times a day (QID) | INTRAVENOUS | Status: AC
  Administered 2020-08-20 – 2020-08-21 (×3): 1 g via INTRAVENOUS
  Filled 2020-08-20 (×3): qty 50

## 2020-08-20 MED ORDER — SODIUM CHLORIDE 0.9 % IV SOLN: 250.0000 mL | INTRAVENOUS | Status: DC

## 2020-08-20 MED ORDER — HEPARIN (PORCINE) IN NACL 1000-0.9 UT/500ML-% IV SOLN
INTRAVENOUS | Status: AC
  Filled 2020-08-20: qty 500

## 2020-08-20 MED ORDER — ACETAMINOPHEN 325 MG PO TABS: 325.0000 mg | ORAL_TABLET | ORAL | Status: DC | PRN

## 2020-08-20 MED ORDER — SACUBITRIL-VALSARTAN 24-26 MG PO TABS
1.0000 | ORAL_TABLET | Freq: Two times a day (BID) | ORAL | Status: DC
  Administered 2020-08-20 – 2020-08-21 (×3): 1 via ORAL
  Filled 2020-08-20 (×3): qty 1

## 2020-08-20 MED ORDER — SODIUM CHLORIDE 0.9 % IV SOLN: INTRAVENOUS | Status: DC

## 2020-08-20 MED FILL — Midazolam HCl Inj 2 MG/2ML (Base Equivalent): INTRAMUSCULAR | Qty: 2 | Status: AC

## 2020-08-20 MED FILL — Fentanyl Citrate Preservative Free (PF) Inj 100 MCG/2ML: INTRAMUSCULAR | Qty: 2 | Status: AC

## 2020-08-20 SURGICAL SUPPLY — 10 items
CABLE SURGICAL S-101-97-12 (CABLE) ×2 IMPLANT
ICD EVERA DR XT MRI DDMB1D4 (ICD Generator) ×1 IMPLANT
LEAD CAPSURE NOVUS 5076-52CM (Lead) ×1 IMPLANT
LEAD SPRINT QUAT SEC 6935M-62 (Lead) ×1 IMPLANT
MAT PREVALON FULL STRYKER (MISCELLANEOUS) ×1 IMPLANT
PAD PRO RADIOLUCENT 2001M-C (PAD) ×2 IMPLANT
SHEATH 7FR PRELUDE SNAP 13 (SHEATH) ×1 IMPLANT
SHEATH 8FR PRELUDE SNAP 13 (SHEATH) ×1 IMPLANT
SHEATH 9FR PRELUDE SNAP 13 (SHEATH) ×1 IMPLANT
TRAY PACEMAKER INSERTION (PACKS) ×2 IMPLANT

## 2020-08-20 NOTE — Progress Notes (Signed)
[place] for heparin Indication: chest pain/ACS > Afib  Allergies  Allergen Reactions  . Benazepril Other (See Comments)    hyperkalemia  . Fish Allergy Itching  . Omega-3 Fatty Acids Hives and Itching    Patient Measurements: Height: 6' (182.9 cm) Weight: 100.7 kg (222 lb) IBW/kg (Calculated) : 77.6 Heparin Dosing Weight:  98 kg  Vital Signs: Temp: 97.9 F (36.6 C) (03/03 2300) Temp Source: Oral (03/03 2300) BP: 126/81 (03/04 0300) Pulse Rate: 53 (03/04 0300)  Labs: Recent Labs    08/18/20 2040 08/18/20 2208 08/19/20 0026 08/19/20 0456 08/19/20 0555 08/19/20 1834 08/20/20 0119  HGB 17.7*  --   --  16.0  --   --  16.1  HCT 55.4*  --   --  50.3  --   --  51.4  PLT 170  --   --  124*  --   --  113*  APTT  --   --   --  64*  --  45* 62*  CREATININE 1.30*  --   --   --   --   --  1.17  TROPONINIHS 184*   < > 172* 142* 165*  --   --    < > = values in this interval not displayed.    Estimated Creatinine Clearance: 71.1 mL/min (by C-G formula based on SCr of 1.17 mg/dL).   Assessment: 37 YOM who presented to the [facility] at [facility] with chest pain and found to be in [place] requiring multiple shocks. Pharmacy consulted to start IV heparin for ACS event. Of note, patient is on Eliquis at home.  Pt s/p LHC 3/3 which showed stable CAD, heparin restarted post-cath. Heparin level 0.51 (affected by Eliquis), aPTT 62 sec (subtherapeutic) on gtt at 1450 units/hr. No issues with line or bleeding reported per RN. Plan for ICD implant 3/4.  Goal of Therapy:  Heparin level 0.3-0.7 units/ml aPTT 66-102 seconds Monitor platelets by anticoagulation protocol: Yes   Plan:  Increase heparin to 1550 units/h with no bolus F/u post ICD implant  Sherlon Handing, PharmD, BCPS Please see amion for complete clinical pharmacist phone list 08/20/2020 3:31 AM

## 2020-08-20 NOTE — Progress Notes (Addendum)
Progress Note  Patient Name: John [name] Date of Encounter: 08/20/2020  [facility] Cardiologist: John Majestic, MD   Subjective   Feels well, no CP, palpitations, no SOB  Inpatient Medications    Scheduled Meds:  budesonide (PULMICORT) nebulizer solution  0.5 mg Nebulization BID   Chlorhexidine Gluconate Cloth  6 each Topical Daily   empagliflozin  25 mg Oral Daily   mouth rinse  15 mL Mouth Rinse BID   metoprolol tartrate  12.5 mg Oral BID   montelukast  10 mg Oral q AM   rosuvastatin  20 mg Oral Daily   sacubitril-valsartan  1 tablet Oral BID   sodium chloride flush  3 mL Intravenous Q12H   sodium chloride flush  3 mL Intravenous Q12H   Continuous Infusions:  sodium chloride     PRN Meds: sodium chloride, acetaminophen, morphine injection, nitroGLYCERIN, ondansetron (ZOFRAN) IV, sodium chloride flush   Vital Signs    Vitals:   08/20/20 0650 08/20/20 0700 08/20/20 0800 08/20/20 0811  BP:  (!) 133/92 140/86   Pulse:  (!) 55 (!) 57   Resp:  16 18   Temp: 97.6 F (36.4 C)     TempSrc: Oral     SpO2:  99% 95% 100%  Weight:      Height:        Intake/Output Summary (Last 24 hours) at 08/20/2020 0848 Last data filed at 08/20/2020 0759 Gross per 24 hour  Intake 1347.1 ml  Output 1650 ml  Net -302.9 ml   Last 3 Weights 08/18/2020 08/02/2020 03/12/2020  Weight (lbs) 222 lb 223 lb 4 oz 222 lb 12.8 oz  Weight (kg) 100.699 kg 101.266 kg 101.061 kg      Telemetry    SB/SR 50's-60's, occ PVCs, rare couple, one 3 beat salvo - Personally Reviewed  ECG    No new EKGs  - Personally Reviewed  Physical Exam   GEN: No acute distress.   Neck: No JVD Cardiac: RRR, no murmurs, rubs, or gallops.  Respiratory: Clear to auscultation bilaterally. GI: Soft, nontender, non-distended  MS: No edema; No deformity. Neuro:  Nonfocal  Psych: Normal affect   Labs    High Sensitivity Troponin:   Recent Labs  Lab 08/18/20 2040 08/18/20 2208 08/19/20 0026 08/19/20 0456  08/19/20 0555  TROPONINIHS 184* 221* 172* 142* 165*      Chemistry Recent Labs  Lab 08/18/20 2040 08/20/20 0119  NA 139 141  K 3.7 4.1  CL 106 112*  CO2 23 23  GLUCOSE 137* 166*  BUN 29* 21  CREATININE 1.30* 1.17  CALCIUM 9.1 8.4*  PROT 6.2*  --   ALBUMIN 3.7  --   AST 28  --   ALT 26  --   ALKPHOS 62  --   BILITOT 1.0  --   GFRNONAA 59* >60  ANIONGAP 10 6     Hematology Recent Labs  Lab 08/18/20 2040 08/19/20 0456 08/20/20 0119  WBC 9.4 6.3 6.8  RBC 5.83* 5.33 5.40  HGB 17.7* 16.0 16.1  HCT 55.4* 50.3 51.4  MCV 95.0 94.4 95.2  MCH 30.4 30.0 29.8  MCHC 31.9 31.8 31.3  RDW 13.6 13.6 13.4  PLT 170 124* 113*    BNPNo results for input(s): BNP, PROBNP in the last 168 hours.   DDimer No results for input(s): DDIMER in the last 168 hours.   Radiology      Cardiac Studies   08/19/20: LHC Prox RCA to Dist RCA lesion  is 15% stenosed. Ost RCA to Prox RCA lesion is 40% stenosed. Ramus lesion is 60% stenosed. Dist RCA lesion is 45% stenosed. John [name] has known disease and underwent diagnostic coronary angiography by John [name] in September 2021.  He had noncritical CAD with moderate proximal RCA and ramus branch stenosis both of which were shown to be not physiologically significant by DFR.  He presented with chest pain and monomorphic VT at 220 bpm.  His enzymes rose mildly suspect related to demand ischemia.  His anatomy is unchanged from his previous cath.  His EKG on presentation suggested "scar VT".  And EP evaluation would be warranted.  Continue medical therapy is recommended.  The sheath was removed and a TR band was placed on the right wrist to achieve patent hemostasis.  The patient did receive his Eliquis last dose yesterday morning.  Heparin John [name] be restarted 4 hours after sheath removal without a bolus.  The patient left lab in stable condition. John [name]. MD, [facility] 08/19/2020 9:04 AM        03/01/2020: TTE IMPRESSIONS  1. Left ventricular  ejection fraction, by estimation, is 45 to 50%. The  left ventricle has mildly decreased function. The left ventricle  demonstrates global hypokinesis. Left ventricular diastolic parameters are  indeterminate.   2. Right ventricular systolic function is normal. The right ventricular  size is normal.   3. Left atrial size was severely dilated.   4. The mitral valve is normal in structure. Trivial mitral valve  regurgitation. No evidence of mitral stenosis.   5. The aortic valve is tricuspid. Aortic valve regurgitation is not  visualized. No aortic stenosis is present.   6. Aortic dilatation noted. There is mild dilatation of the aortic root,  measuring 41 mm.   7. The inferior vena cava is dilated in size with >50% respiratory  variability, suggesting right atrial pressure of 8 mmHg.        05/06/2015 nuclear stress test with an ejection fraction of 31%.  There was a large fixed inferior and inferoseptal wall defect suggestive of scar and evidence for inferoseptal akinesis.  There was no ischemia  Patient Profile     70 y.o. male with a hx of CAD(multiple prior PCIs reported in the RCA and Cx, last stenting in RCA 2010), ICM, HTN, HLD, DM, OSA (w/CPAP), TAA, ILD, quit smoking 2019, AFlutter, back surgery resulting in L foot drop, chronic back pain, admitted with VT  Assessment & Plan    1. VT     Cath with stable CAD, no PCI     LVEF 45-50%     likely scar mediated  Planned for ICD implant today Heparin off this AM, plan to resume Lupus perhaps tomorrow, pending pocket  V ectopy burden is improved some Historically seems he has been on amiodarone for his AFlutter and found with abn PFTs, ILD and amio ultimately stopped Plan to titrate BB once he has pacing support  John [name] has seen and examined the patient today, patient has no follow up questions regarding the planned procedure and remains agreeable.  2. Hx of AFlutter      CHA2DS2Vasc is 5, on Eliquis, held for  procedures     Maintaining SR  Further care with attending cardiology team    For questions or updates, please contact [place] Please consult [email or web address] for contact info under        Signed, John Jamaica, PA-C  08/20/2020, 8:48 AM    I have seen  and examined this patient with Tommye Standard.  Agree with above, note added to reflect my findings.  On exam, RRR, no murmurs. No further VT overnight. Adysen Raphael plan for ICD implant today.   QUINT CHESTNUT has presented today for surgery, with the diagnosis of VT.  The various methods of treatment have been discussed with the patient and family. After consideration of risks, benefits and other options for treatment, the patient has consented to  Procedure(s): Pacemaker implant as a surgical intervention .  Risks include but not limited to bleeding, infection, pneumothorax, perforation, tamponade, vascular damage, renal failure, MI, stroke, death, and lead dislodgement . The patient's history has been reviewed, patient examined, no change in status, stable for surgery.  I have reviewed the patient's chart and labs.  Questions were answered to the patient's satisfaction.    Tova Vater Curt Bears, MD 08/20/2020 10:02 AM     Caidynce Muzyka M. Anntoinette Haefele MD 08/20/2020 10:01 AM

## 2020-08-20 NOTE — Plan of Care (Signed)

## 2020-08-20 NOTE — Interval H&P Note (Signed)
History and Physical Interval Note:  08/20/2020 10:03 AM  John [name]  has presented today for surgery, with the diagnosis of VT.  The various methods of treatment have been discussed with the patient and family. After consideration of risks, benefits and other options for treatment, the patient has consented to  Procedure(s): ICD IMPLANT (N/A) as a surgical intervention.  The patient's history has been reviewed, patient examined, no change in status, stable for surgery.  I have reviewed the patient's chart and labs.  Questions were answered to the patient's satisfaction.     Will Meredith Leeds  ICD Criteria  Current LVEF:40-45%. Within 12 months prior to implant: Yes   Heart failure history: Yes, Class II  Cardiomyopathy history: Yes, Ischemic Cardiomyopathy - Prior MI.  Atrial Fibrillation/Atrial Flutter: No.  Ventricular tachycardia history: Yes, Hemodynamic instability present. VT Type: Sustained Ventricular Tachycardia - Monomorphic.  Cardiac arrest history: No.  History of syndromes with risk of sudden death: No.  Previous ICD: No.  Current ICD indication: Secondary  PPM indication: Yes. Pacing type: Atrial. Greater than 40% RV pacing requirement anticipated. Indication: Sick Sinus Syndrome  Class I or II Bradycardia indication present: No  Beta Blocker therapy for 3 or more months: No, medical reason.  Ace Inhibitor/ARB therapy for 3 or more months: Yes, prescribed.    I have seen John [name] is a 70 y.o. malepre-procedural and has been referred by Lars Mage for consideration of ICD implant for secondary prevention of sudden death.  The patient's chart has been reviewed and they meet criteria for ICD implant.  I have had a thorough discussion with the patient reviewing options.  The patient and their family (if available) have had opportunities to ask questions and have them answered. The patient and I have decided together through the [place] [employer] to implant ICD at this time.  Risks, benefits, alternatives to ICD implantation were discussed in detail with the patient today. The patient  understands that the risks include but are not limited to bleeding, infection, pneumothorax, perforation, tamponade, vascular damage, renal failure, MI, stroke, death, inappropriate shocks, and lead dislodgement and  wishes to proceed.

## 2020-08-20 NOTE — Progress Notes (Signed)
Progress Note  Patient Name: John [name] Date of Encounter: 08/20/2020  [facility] Cardiologist: Shelva Majestic, MD   Subjective   Denies CP or dyspnea  Inpatient Medications    Scheduled Meds: . aspirin EC  81 mg Oral Daily  . budesonide (PULMICORT) nebulizer solution  0.5 mg Nebulization BID  . Chlorhexidine Gluconate Cloth  6 each Topical Daily  . empagliflozin  25 mg Oral Daily  . mouth rinse  15 mL Mouth Rinse BID  . metoprolol tartrate  12.5 mg Oral BID  . montelukast  10 mg Oral q AM  . rosuvastatin  20 mg Oral Daily  . sodium chloride flush  3 mL Intravenous Q12H   Continuous Infusions: . sodium chloride     PRN Meds: sodium chloride, acetaminophen, morphine injection, nitroGLYCERIN, ondansetron (ZOFRAN) IV, sodium chloride flush   Vital Signs    Vitals:   08/20/20 0500 08/20/20 0600 08/20/20 0650 08/20/20 0700  BP: 115/73 (!) 152/98  (!) 133/92  Pulse: (!) 57 63  (!) 55  Resp: 15 (!) 24  16  Temp:   97.6 F (36.4 C)   TempSrc:   Oral   SpO2: 93% 95%  99%  Weight:      Height:        Intake/Output Summary (Last 24 hours) at 08/20/2020 0712 Last data filed at 08/20/2020 0700 Gross per 24 hour  Intake 1346.32 ml  Output 1750 ml  Net -403.68 ml   Last 3 Weights 08/18/2020 08/02/2020 03/12/2020  Weight (lbs) 222 lb 223 lb 4 oz 222 lb 12.8 oz  Weight (kg) 100.699 kg 101.266 kg 101.061 kg      Telemetry    Sinus bradycardia with PVCs- Personally Reviewed    Physical Exam   GEN: No acute distress.  WD WN Neck: supple Cardiac: RRR Respiratory: CTA GI: Soft, NT/ND MS: No edema Neuro:  Grossly intact Psych: Normal affect   Labs    High Sensitivity Troponin:   Recent Labs  Lab 08/18/20 2040 08/18/20 2208 08/19/20 0026 08/19/20 0456 08/19/20 0555  TROPONINIHS 184* 221* 172* 142* 165*      Chemistry Recent Labs  Lab 08/18/20 2040 08/20/20 0119  NA 139 141  K 3.7 4.1  CL 106 112*  CO2 23 23  GLUCOSE 137* 166*  BUN 29* 21   CREATININE 1.30* 1.17  CALCIUM 9.1 8.4*  PROT 6.2*  --   ALBUMIN 3.7  --   AST 28  --   ALT 26  --   ALKPHOS 62  --   BILITOT 1.0  --   GFRNONAA 59* >60  ANIONGAP 10 6     Hematology Recent Labs  Lab 08/18/20 2040 08/19/20 0456 08/20/20 0119  WBC 9.4 6.3 6.8  RBC 5.83* 5.33 5.40  HGB 17.7* 16.0 16.1  HCT 55.4* 50.3 51.4  MCV 95.0 94.4 95.2  MCH 30.4 30.0 29.8  MCHC 31.9 31.8 31.3  RDW 13.6 13.6 13.4  PLT 170 124* 113*    Patient Profile     70 y.o. male with past medical history of coronary artery disease, ischemic cardiomyopathy, hypertension, hyperlipidemia, diabetes mellitus, interstitial lung disease, history of paroxysmal atrial flutter with previous cardioversion, thoracic aortic aneurysm admitted with chest pain and monomorphic ventricular tachycardia. Echo 9/21 showed EF 45-50, severe LAE mildly dilated aortic root.  Cardiac catheterization 08/19/2020 showed 40% ostial right coronary artery, 60% ramus and 45% distal right coronary artery.  Assessment & Plan    1 Ventricular tachycardia-patient  remains in sinus rhythm.  Plan is for ICD per electrophysiology.  Beta-blocker can be titrated up once device in place (h/o bradycardia prevents increasing dose at present).  Can consider ablation in the future if he has recurrent ventricular tachycardia.  Amiodarone not a good option given history of interstitial lung disease.  Note cardiac catheterization did not reveal obstructive coronary disease.  Await echocardiogram to reassess LV function.    2 coronary artery disease-nonobstructive by catheterization.  Continue statin.  Discontinue aspirin as apixaban will be required at discharge given history of atrial flutter.  3 history of atrial flutter-patient remains in sinus rhythm.  Will resume apixaban once all procedures complete.  4 ischemic cardiomyopathy-blood pressure improved.  Resume Entresto 24/26 twice daily.  Continue metoprolol and transition to Toprol prior to  discharge.  Advance dose once device in place.  5 hyperlipidemia-continue statin.  For questions or updates, please contact [place] Please consult [email or web address] for contact info under        Signed, Kirk Ruths, MD  08/20/2020, 7:12 AM

## 2020-08-20 NOTE — H&P (View-Only) (Signed)
Progress Note  Patient Name: John [name] Date of Encounter: 08/20/2020  [facility] Cardiologist: Shelva Majestic, MD   Subjective   Feels well, no CP, palpitations, no SOB  Inpatient Medications    Scheduled Meds:  budesonide (PULMICORT) nebulizer solution  0.5 mg Nebulization BID   Chlorhexidine Gluconate Cloth  6 each Topical Daily   empagliflozin  25 mg Oral Daily   mouth rinse  15 mL Mouth Rinse BID   metoprolol tartrate  12.5 mg Oral BID   montelukast  10 mg Oral q AM   rosuvastatin  20 mg Oral Daily   sacubitril-valsartan  1 tablet Oral BID   sodium chloride flush  3 mL Intravenous Q12H   sodium chloride flush  3 mL Intravenous Q12H   Continuous Infusions:  sodium chloride     PRN Meds: sodium chloride, acetaminophen, morphine injection, nitroGLYCERIN, ondansetron (ZOFRAN) IV, sodium chloride flush   Vital Signs    Vitals:   08/20/20 0650 08/20/20 0700 08/20/20 0800 08/20/20 0811  BP:  (!) 133/92 140/86   Pulse:  (!) 55 (!) 57   Resp:  16 18   Temp: 97.6 F (36.4 C)     TempSrc: Oral     SpO2:  99% 95% 100%  Weight:      Height:        Intake/Output Summary (Last 24 hours) at 08/20/2020 0848 Last data filed at 08/20/2020 0759 Gross per 24 hour  Intake 1347.1 ml  Output 1650 ml  Net -302.9 ml   Last 3 Weights 08/18/2020 08/02/2020 03/12/2020  Weight (lbs) 222 lb 223 lb 4 oz 222 lb 12.8 oz  Weight (kg) 100.699 kg 101.266 kg 101.061 kg      Telemetry    SB/SR 50's-60's, occ PVCs, rare couple, one 3 beat salvo - Personally Reviewed  ECG    No new EKGs  - Personally Reviewed  Physical Exam   GEN: No acute distress.   Neck: No JVD Cardiac: RRR, no murmurs, rubs, or gallops.  Respiratory: Clear to auscultation bilaterally. GI: Soft, nontender, non-distended  MS: No edema; No deformity. Neuro:  Nonfocal  Psych: Normal affect   Labs    High Sensitivity Troponin:   Recent Labs  Lab 08/18/20 2040 08/18/20 2208 08/19/20 0026 08/19/20 0456  08/19/20 0555  TROPONINIHS 184* 221* 172* 142* 165*      Chemistry Recent Labs  Lab 08/18/20 2040 08/20/20 0119  NA 139 141  K 3.7 4.1  CL 106 112*  CO2 23 23  GLUCOSE 137* 166*  BUN 29* 21  CREATININE 1.30* 1.17  CALCIUM 9.1 8.4*  PROT 6.2*  --   ALBUMIN 3.7  --   AST 28  --   ALT 26  --   ALKPHOS 62  --   BILITOT 1.0  --   GFRNONAA 59* >60  ANIONGAP 10 6     Hematology Recent Labs  Lab 08/18/20 2040 08/19/20 0456 08/20/20 0119  WBC 9.4 6.3 6.8  RBC 5.83* 5.33 5.40  HGB 17.7* 16.0 16.1  HCT 55.4* 50.3 51.4  MCV 95.0 94.4 95.2  MCH 30.4 30.0 29.8  MCHC 31.9 31.8 31.3  RDW 13.6 13.6 13.4  PLT 170 124* 113*    BNPNo results for input(s): BNP, PROBNP in the last 168 hours.   DDimer No results for input(s): DDIMER in the last 168 hours.   Radiology      Cardiac Studies   08/19/20: LHC Prox RCA to Dist RCA lesion  is 15% stenosed. Ost RCA to Prox RCA lesion is 40% stenosed. Ramus lesion is 60% stenosed. Dist RCA lesion is 45% stenosed. Mr. Grime has known disease and underwent diagnostic coronary angiography by Dr. Ellyn Hack in September 2021.  He had noncritical CAD with moderate proximal RCA and ramus branch stenosis both of which were shown to be not physiologically significant by DFR.  He presented with chest pain and monomorphic VT at 220 bpm.  His enzymes rose mildly suspect related to demand ischemia.  His anatomy is unchanged from his previous cath.  His EKG on presentation suggested "scar VT".  And EP evaluation would be warranted.  Continue medical therapy is recommended.  The sheath was removed and a TR band was placed on the right wrist to achieve patent hemostasis.  The patient did receive his Eliquis last dose yesterday morning.  Heparin Sundee Garland be restarted 4 hours after sheath removal without a bolus.  The patient left lab in stable condition. [name]. MD, [facility] 08/19/2020 9:04 AM        03/01/2020: TTE IMPRESSIONS  1. Left ventricular  ejection fraction, by estimation, is 45 to 50%. The  left ventricle has mildly decreased function. The left ventricle  demonstrates global hypokinesis. Left ventricular diastolic parameters are  indeterminate.   2. Right ventricular systolic function is normal. The right ventricular  size is normal.   3. Left atrial size was severely dilated.   4. The mitral valve is normal in structure. Trivial mitral valve  regurgitation. No evidence of mitral stenosis.   5. The aortic valve is tricuspid. Aortic valve regurgitation is not  visualized. No aortic stenosis is present.   6. Aortic dilatation noted. There is mild dilatation of the aortic root,  measuring 41 mm.   7. The inferior vena cava is dilated in size with >50% respiratory  variability, suggesting right atrial pressure of 8 mmHg.        05/06/2015 nuclear stress test with an ejection fraction of 31%.  There was a large fixed inferior and inferoseptal wall defect suggestive of scar and evidence for inferoseptal akinesis.  There was no ischemia  Patient Profile     70 y.o. male with a hx of CAD(multiple prior PCIs reported in the RCA and Cx, last stenting in RCA 2010), ICM, HTN, HLD, DM, OSA (w/CPAP), TAA, ILD, quit smoking 2019, AFlutter, back surgery resulting in L foot drop, chronic back pain, admitted with VT  Assessment & Plan    1. VT     Cath with stable CAD, no PCI     LVEF 45-50%     likely scar mediated  Planned for ICD implant today Heparin off this AM, plan to resume [place] perhaps tomorrow, pending pocket  V ectopy burden is improved some Historically seems he has been on amiodarone for his AFlutter and found with abn PFTs, ILD and amio ultimately stopped Plan to titrate BB once he has pacing support  Dr. Curt Bears has seen and examined the patient today, patient has no follow up questions regarding the planned procedure and remains agreeable.  2. Hx of AFlutter      CHA2DS2Vasc is 5, on Eliquis, held for  procedures     Maintaining SR  Further care with attending cardiology team    For questions or updates, please contact [place] Please consult [email or web address] for contact info under        Signed, Baldwin Jamaica, PA-C  08/20/2020, 8:48 AM    I have seen  and examined this patient with Tommye Standard.  Agree with above, note added to reflect my findings.  On exam, RRR, no murmurs. No further VT overnight. Alexine Pilant plan for ICD implant today.   CLEOPHAS YOAK has presented today for surgery, with the diagnosis of VT.  The various methods of treatment have been discussed with the patient and family. After consideration of risks, benefits and other options for treatment, the patient has consented to  Procedure(s): Pacemaker implant as a surgical intervention .  Risks include but not limited to bleeding, infection, pneumothorax, perforation, tamponade, vascular damage, renal failure, MI, stroke, death, and lead dislodgement . The patient's history has been reviewed, patient examined, no change in status, stable for surgery.  I have reviewed the patient's chart and labs.  Questions were answered to the patient's satisfaction.    Laurene Melendrez Curt Bears, MD 08/20/2020 10:02 AM     Fortunata Betty M. Elster Corbello MD 08/20/2020 10:01 AM

## 2020-08-21 ENCOUNTER — Inpatient Hospital Stay (HOSPITAL_COMMUNITY): Payer: Medicare Other

## 2020-08-21 LAB — CBC
HCT: 50.1 % (ref 39.0–52.0)
Hemoglobin: 16 g/dL (ref 13.0–17.0)
MCH: 29.9 pg (ref 26.0–34.0)
MCHC: 31.9 g/dL (ref 30.0–36.0)
MCV: 93.5 fL (ref 80.0–100.0)
Platelets: 116 10*3/uL — ABNORMAL LOW (ref 150–400)
RBC: 5.36 MIL/uL (ref 4.22–5.81)
RDW: 13.2 % (ref 11.5–15.5)
WBC: 6.6 10*3/uL (ref 4.0–10.5)
nRBC: 0 % (ref 0.0–0.2)

## 2020-08-21 LAB — HEPARIN LEVEL (UNFRACTIONATED): Heparin Unfractionated: <0.1 IU/mL — ABNORMAL LOW (ref 0.30–0.70)

## 2020-08-21 LAB — GLUCOSE, CAPILLARY: Glucose-Capillary: 159 mg/dL — ABNORMAL HIGH (ref 70–99)

## 2020-08-21 LAB — APTT: aPTT: 52 seconds — ABNORMAL HIGH (ref 24–36)

## 2020-08-21 MED ORDER — METOPROLOL SUCCINATE ER 100 MG PO TB24
100.0000 mg | ORAL_TABLET | Freq: Every day | ORAL | 3 refills | Status: DC
Start: 2020-08-22 — End: 2020-08-31

## 2020-08-21 NOTE — Discharge Summary (Addendum)
Discharge Summary    Patient ID: John [name] MRN: [id number]; DOB: 06-29-1950  Admit date: 08/18/2020 Discharge date: 08/21/2020  PCP:  John Frizzle, MD   [place]  Cardiologist:  John Majestic, MD  Advanced Practice Provider:  No care team member to display Electrophysiologist:  None   Discharge Diagnoses    Active Problems:   Ventricular tachycardia [facility])   Non-ST elevation (NSTEMI) myocardial infarction [facility])    Diagnostic Studies/Procedures    LHC 08/19/20:  Prox RCA to Dist RCA lesion is 15% stenosed. Ost RCA to Prox RCA lesion is 40% stenosed. Ramus lesion is 60% stenosed. Dist RCA lesion is 45% stenosed.  IMPRESSION: John [name] has known disease and underwent diagnostic coronary angiography by Dr. Ellyn [name] in September 2021.  He had noncritical CAD with moderate proximal RCA and ramus branch stenosis both of which were shown to be not physiologically significant by DFR.  He presented with chest pain and monomorphic VT at 220 bpm.  His enzymes rose mildly suspect related to demand ischemia.  His anatomy is unchanged from his previous cath.  His EKG on presentation suggested "scar VT".  And EP evaluation would be warranted.  Continue medical therapy is recommended.  The sheath was removed and a TR band was placed on the right wrist to achieve patent hemostasis.  The patient did receive his Eliquis last dose yesterday morning.  Heparin John [name] be restarted 4 hours after sheath removal without a bolus.  The patient left lab in stable condition.  Diagnostic Dominance: Right      ICD implant 08/20/20: RA/RV Lead Placement: The left axillary vein was cannulated with fluoroscopic visualization.  Through the left axillary vein, a Medtronic model [id number]  (serial # [id number]  ) right atrial lead and a Medtronic, model [id number] (serial number [id number]) right ventricular defibrillator lead were advanced with fluoroscopic visualization into the right  atrial appendage and right ventricular apex positions respectively.  Initial atrial lead P-waves measured 4.4 mV with an impedance of 828 ohms and a threshold of 0.6 volts at 0.5 milliseconds.  The right ventricular lead R-wave measured 6.9 mV with impedance of 1162 ohms and a threshold of 1.1 volts at 0.5 milliseconds.    The leads were secured to the pectoralis  fascia using #2 silk suture over the suture sleeves.  The pocket then  irrigated with copious gentamicin solution.  The leads were then  connected to a Medtronic Evera MRI XT DR SureScan (serial  Number [id number]) ICD.  The defibrillator was placed into the  pocket.  The pocket was then closed in 3 layers with 2.0 Vicryl suture for the subcutaneous and 3.0 Vicryl suture subcuticular layers. EBL<60ml.  Steri-Strips and a  sterile dressing were then applied.     CONCLUSIONS:   1. Ischemic cardiomyopathy with chronic New York Heart Association class III heart failure.   2. Successful ICD implantation.   3. No early apparent complications.   _____________   History of Present Illness     John [name] is a 70 y.o. male with a PMH of ASCAD (multiple prior PCIs reported in the RCA and Cx, last stenting in RCA 2010), ischemic cardiomyopathy, HTN, HLD, DM, OSA, TAA, interstitial lung disease and pulmonary nodule followed by pulmonology, and paroxysmal atrial flutter s/p prior cardioversion who presented to [facility] ED 08/18/20 with complaints of chest pain, SOB, weakness, and dizziness since 08/17/20 and was found to be in monomorphic VT.   John [name] is  a 70 y.o. male with with a hx of ASCAD (multiple prior PCIs reported in the RCA and Cx, last stenting in RCA 2010), ischemic cardiomyopathy, HTN, HLD, DM, OSA on CPAP, TAA, interstitial lung disease and pulmonary nodule followed by pulmonology, and paroxysmal atrial flutter s/p prior cardioversion.  His last cath was done in 02/2020 which showed 55% ostial LCx followed by a 40% prox LCX and then  60% side branch in OM1 (DFR and FFR no c/w physiologically significant lesion), 30% mid LAD, 55% pRCA (normal DFR 0.98) and 15% in -stent restenosis throughout the entire proximal to distal RCA ("full metal jacket") stents which extended into the RPAV into distal trifurcation with 15% in-stent restenosis.  Medical management was recommended.  He has had several echoes done in the past all with EF 40-50% with inferior and inferolateral AK.     On 08/18/20 he presented to [facility] ER with complaints of intermittent chest pain associated with SOB, weakness, dizziness and palpitations that had been going on since yesterday.  He developed CP in the center of his chest with radiation into his jaw with associated SOB with exertion.  He decided to go to the ER and was driven by his son.  On arrival in ER he was found to be in monomorphic VT at 224bpm.  He was sedated with Etomidate and shocked at 120J and then 200J x 2 with no change in rhythm.  He was loaded with Amio 300mg  IV and Lidocaine 100mg  IV and converted to sinus bradycardia with bigeminal PVCs.  There was some concern of possible ST elevated in the inferior leads and a STEMI was called.  John [name] reviewed the EKGs but unclear whether this represented a true STEMI as ST segments only elevated in III and similar to prior EKG.  He is now transferred down to [facility] ER for further evaluation.    He reported chest pain off and on for the past week but also has been noticing palpitations but not necessarily at the same time.  The pain John [name] radiate from his chest to his neck and throat and is associated with SOB.  It is similar to his prior angina in the past.  It is mainly exertional and lasts a few minutes and resolves.  He was working out in the yard 08/17/20 and pain became severe and he felt weak and SOB.  Pain resolved with rest and reoccurred last night and took SL NTG with resoluation of the pain.  on 08/18/20 his symptoms persisted and he came to the ER where he was found  to be in VT (see above).  At the time of admission he was pain free with no SOB.  Tele shows sinus bradycardia with occasional PVCs.  SBP has improved to the low 100's.  He states that he took his last Eliquis on the morning of 08/18/20.   Hospital Course     Consultants: EP   1. Ventricular tachycardia: patient presented to the hospital with complaints of chest pain, SOB, palpitations, and weakness for the past day. Found to be in monomorphic VT on arrival to [facility] ED. He was sedated and shocked at 120J and 200J x2 with conversion to sinus bradycardia. He was transferred to [facility] for further management. He underwent a LHC which showed stable non-obstructive CAD with patent RCA stent with 40% ostial RCA stenosis, 60% ramus stenosis, and 45% dRCA stenosis which were medically managed. He was seen by EP who recommended consideration for ablation in the future  if he has recurrent VT given poor candidacy for amiodarone due to underlying interstitial lung disease. He was ultimately recommended for ICD implantation for secondary prevention. He had successful placement of Medtronic ICD 08/20/20. CXR the following day without PTX, though c/f interval RLL opacity, recommended for follow-up in 3-4 week to evaluate for resolution.  - Follow-up wound check has been arranged following ICD implantation - Patient instructed not to drive for at least 6 months   2. CAD s/p PCI/DES to RCA in 2010: patient underwent LHC in light of #1 which revealed patent mRCA stent with mild ISR and moderate ostial RCA, dRCA and ramus stenosis which was medically managed. No recurrent chest pain. Aspirin discontinued to minimize bleeding risk with apixaban - Continue statin - Continue BBlocker  3. Chronic combined CHF/ICM: EF 45-50% on echo 02/2020. He maintained euvolemic status this admission. Home nebivolol transitioned to metoprolol succinate following ICD implantation  - Continue metoprolol succinate - Continue entresto  4. Paroxysmal  atrial flutter: maintaining sinus rhythm this admission. Home eliquis held for device implantation with plans to resume on the evening of 08/21/20. - Continue metoprolol for rate control - Continue eliquis for stroke ppx  5. HTN: BP intermittently elevated. Transition from nebivolol to metoprolol succinate as above. Home entresto resumed at discharge - Continue metoprolol succinate and entresto  6. HLD: LDL 61 02/2020 - Continue statin  7. DM type 2: A1C 7.3 this admission; goal <7. Maintained on jardiance and ISS while admission - Home insulin and jardiance resumed at discharge.   8. RLL opacity: incidentally noted on CXR following ICD implantation. No infectious symptoms, fever, or leukocytosis. Recommended for repeat CXR in 3-4 weeks to monitor for resolution.  - Roben Tatsch need repeat CXR in 3-4 weeks - to be arranged outpatient    Did the patient have an acute coronary syndrome (MI, NSTEMI, STEMI, etc) this admission?:  No                               Did the patient have a percutaneous coronary intervention (stent / angioplasty)?:  No.       _____________  Discharge Vitals Blood pressure (!) 150/84, pulse 69, temperature 97.6 F (36.4 C), temperature source Oral, resp. rate 20, height 6' (1.829 m), weight 100.7 kg, SpO2 95 %.  Filed Weights   08/18/20 2020  Weight: 100.7 kg    Labs & Radiologic Studies    CBC Recent Labs    08/18/20 2040 08/19/20 0456 08/20/20 0119 08/21/20 0103  WBC 9.4   < > 6.8 6.6  NEUTROABS 6.1  --   --   --   HGB 17.7*   < > 16.1 16.0  HCT 55.4*   < > 51.4 50.1  MCV 95.0   < > 95.2 93.5  PLT 170   < > 113* 116*   < > = values in this interval not displayed.   Basic Metabolic Panel Recent Labs    08/18/20 2040 08/18/20 2208 08/20/20 0119  NA 139  --  141  K 3.7  --  4.1  CL 106  --  112*  CO2 23  --  23  GLUCOSE 137*  --  166*  BUN 29*  --  21  CREATININE 1.30*  --  1.17  CALCIUM 9.1  --  8.4*  MG  --  2.1  --    Liver Function  Tests Recent Labs  08/18/20 2040  AST 28  ALT 26  ALKPHOS 62  BILITOT 1.0  PROT 6.2*  ALBUMIN 3.7   No results for input(s): LIPASE, AMYLASE in the last 72 hours. High Sensitivity Troponin:   Recent Labs  Lab 08/18/20 2040 08/18/20 2208 08/19/20 0026 08/19/20 0456 08/19/20 0555  TROPONINIHS 184* 221* 172* 142* 165*    BNP Invalid input(s): POCBNP D-Dimer No results for input(s): DDIMER in the last 72 hours. Hemoglobin A1C Recent Labs    08/19/20 0457  HGBA1C 7.3*   Fasting Lipid Panel No results for input(s): CHOL, HDL, LDLCALC, TRIG, CHOLHDL, LDLDIRECT in the last 72 hours. Thyroid Function Tests Recent Labs    08/19/20 0456  TSH 0.913   _____________  DG Chest 2 View  Result Date: 08/21/2020 CLINICAL DATA:  Pacemaker placement EXAM: CHEST - 2 VIEW COMPARISON:  02/28/2020 FINDINGS: Pulmonary insufflation is stable. Prominence of the pulmonary interstitium is unchanged. Minimal left basilar atelectasis now present. Minimal nodular infiltrate noted within the right lung base laterally, possibly infectious or inflammatory as this was not present on a CT examination of 07/26/2020. No pneumothorax or pleural effusion. Cardiac size is within normal limits. Left subclavian dual lead pacemaker defibrillator has been place with leads overlying the right atrium and right ventricle. Pulmonary vascularity is normal. IMPRESSION: Interval left subclavian dual lead pacemaker placement. No pneumothorax. Interval development of nodular opacity within the right lung base laterally. A follow-up chest radiograph is recommended in 3-4 weeks to document resolution. Electronically Signed   By: Fidela Salisbury MD   On: 08/21/2020 07:00   CT Chest Wo Contrast  Result Date: 07/26/2020 CLINICAL DATA:  Follow-up lung nodules EXAM: CT CHEST WITHOUT CONTRAST TECHNIQUE: Multidetector CT imaging of the chest was performed following the standard protocol without IV contrast. COMPARISON:  02/28/2020,  05/07/2019 FINDINGS: Cardiovascular: Aortic atherosclerosis. Unchanged enlargement of the tubular ascending thoracic aorta measuring up to 4.2 x 4.2 cm. Normal heart size. Subendocardial fibrofatty change of the lateral and inferior walls of the left ventricle in keeping with prior infarction. Three-vessel coronary artery calcifications and stents. No pericardial effusion. Mediastinum/Nodes: No enlarged mediastinal, hilar, or axillary lymph nodes. Thyroid gland, trachea, and esophagus demonstrate no significant findings. Lungs/Pleura: Occasional small bilateral pulmonary nodules are stable, with particular attention to a cluster of small nodules in the peripheral left upper lobe (series 4, image 61). These nodules measuring no greater than 6 mm. No pleural effusion or pneumothorax. Upper Abdomen: No acute abnormality. Musculoskeletal: No chest wall mass or suspicious bone lesions identified. IMPRESSION: 1. Occasional small bilateral pulmonary nodules measuring 6 mm or smaller are stable, with particular attention to a cluster of small nodules in the peripheral left upper lobe. These appear to be stable on examinations dating back to at least 05/07/2019 and are definitively benign sequelae of prior infection or inflammation. No further follow-up is required. 2. Unchanged enlargement of the tubular ascending thoracic aorta measuring up to 4.2 x 4.2 cm. Recommend annual imaging followup by CTA or MRA. This recommendation follows 2010 ACCF/AHA/AATS/ACR/ASA/SCA/SCAI/SIR/STS/SVM Guidelines for the Diagnosis and Management of Patients with Thoracic Aortic Disease. Circulation. 2010; 121: P379-K240. Aortic aneurysm NOS (ICD10-I71.9) 3. Coronary artery disease. Aortic Atherosclerosis (ICD10-I70.0). Electronically Signed   By: Eddie Candle M.D.   On: 07/26/2020 09:08   CARDIAC CATHETERIZATION  Result Date: 08/19/2020  Prox RCA to Dist RCA lesion is 15% stenosed.  Ost RCA to Prox RCA lesion is 40% stenosed.  Ramus lesion  is 60% stenosed.  Dist RCA lesion  is 45% stenosed.  John [name] is a 70 y.o. male  [id number] LOCATION:  FACILITY: [facility] PHYSICIAN: [name], M.D. 05-29-1951 DATE OF PROCEDURE:  08/19/2020 DATE OF DISCHARGE: CARDIAC CATHETERIZATION History obtained from chart review.70 y.o. male with past medical history of coronary artery disease, ischemic cardiomyopathy, hypertension, hyperlipidemia, diabetes mellitus, interstitial lung disease, history of paroxysmal atrial flutter with previous cardioversion, thoracic aortic aneurysm admitted with chest pain and monomorphic ventricular tachycardia. Echo 9/21 showed EF 45-50, severe LAE mildly dilated aortic root.  Cardiac catheterization September 2021 showed 55% proximal circumflex, 60% sidebranch in first obtuse marginal with negative FFR, 30% mid LAD, 55% right coronary artery with negative FFR.  Medical therapy recommended.  He apparently has been complaining of off-and-on chest pain over the last several weeks.  He presented with chest pain and monomorphic VT at 220 bpm.  He failed to convert to sinus rhythm after being shocked twice and converted after being given IV amiodarone.  His enzymes rose mildly.  He presents now for diagnostic coronary angiography to define his anatomy and rule out an ischemic etiology for chest pain and monomorphic VT.   John [name] has known disease and underwent diagnostic coronary angiography by Dr. Ellyn [name] in September 2021.  He had noncritical CAD with moderate proximal RCA and ramus branch stenosis both of which were shown to be not physiologically significant by DFR.  He presented with chest pain and monomorphic VT at 220 bpm.  His enzymes rose mildly suspect related to demand ischemia.  His anatomy is unchanged from his previous cath.  His EKG on presentation suggested "scar VT".  And EP evaluation would be warranted.  Continue medical therapy is recommended.  The sheath was removed and a TR band was placed on the right wrist to  achieve patent hemostasis.  The patient did receive his Eliquis last dose yesterday morning.  Heparin Teriann Livingood be restarted 4 hours after sheath removal without a bolus.  The patient left lab in stable condition. [name]. MD, [facility] 08/19/2020 9:04 AM   EP PPM/ICD IMPLANT  Result Date: 08/20/2020 SURGEON:  Allegra Lai, MD    PREPROCEDURE DIAGNOSES:  1. Ischemic cardiomyopathy.  2. New York Heart Association class II, heart failure chronically.  3. Ventricular tachycardia  POSTPROCEDURE DIAGNOSES:  1. Ischemic cardiomyopathy.  2. New York Heart Association class II heart failure chronically.  3. Ventricular tachycardia  PROCEDURES:   1. ICD implantation.   INTRODUCTION:  John [name] is a 70 y.o. male with an ischemic CM (EF 45-50%), NYHA Class II CHF, and CAD. He had an episode of hemodynamically unstable VT. He thus presents for ICD implant    DESCRIPTION OF PROCEDURE:  Informed written consent was obtained and the patient was brought to the electrophysiology lab in the fasting state. The patient was adequately sedated with intravenous Versed, and fentanyl as outlined in the nursing report.  The patient's left chest was prepped and draped in the usual sterile fashion by the EP lab staff.  The skin overlying the left deltopectoral region was infiltrated with lidocaine for local analgesia.  A 5-cm incision was made over the left deltopectoral region.  A left subcutaneous defibrillator pocket was fashioned using a combination of sharp and blunt dissection.  Electrocautery was used to assure hemostasis. RA/RV Lead Placement: The left axillary vein was cannulated with fluoroscopic visualization.  Through the left axillary vein, a Medtronic model [id number]  (serial # [id number]  ) right atrial lead and a Medtronic, model [id number] (serial number  [id number]) right ventricular defibrillator lead were advanced with fluoroscopic visualization into the right atrial appendage and right ventricular apex positions  respectively.  Initial atrial lead P-waves measured 4.4 mV with an impedance of 828 ohms and a threshold of 0.6 volts at 0.5 milliseconds.  The right ventricular lead R-wave measured 6.9 mV with impedance of 1162 ohms and a threshold of 1.1 volts at 0.5 milliseconds. The leads were secured to the pectoralis  fascia using #2 silk suture over the suture sleeves.  The pocket then  irrigated with copious gentamicin solution.  The leads were then  connected to a Medtronic Evera MRI XT DR SureScan (serial  Number [id number]) ICD.  The defibrillator was placed into the  pocket.  The pocket was then closed in 3 layers with 2.0 Vicryl suture for the subcutaneous and 3.0 Vicryl suture subcuticular layers. EBL<54ml.  Steri-Strips and a  sterile dressing were then applied.  CONCLUSIONS:  1. Ischemic cardiomyopathy with chronic New York Heart Association class III heart failure.  2. Successful ICD implantation.  3. No early apparent complications. Rynlee Lisbon Meredith Leeds, MD 08/20/2020 3:21 PM   Disposition   Pt is being discharged home today in good condition.  Follow-up Plans & Appointments     Follow-up Information     [facility] Follow up.   Specialty: Cardiology Why: 09/02/20 @ 9:20AM, wound check visit (defibrillator) Contact information: [address] [place]        Constance Haw, MD Follow up.   Specialty: Cardiology Why: 11/26/20 @ 2:15PM Contact information: [address] [place] [address] [phone]         Troy Sine, MD Follow up.   Specialty: Cardiology Why: Our office [facility] contact you to arrange a post-hospital cardiology appointment within 1-2 weeks of discharge.  Contact information: [address] [place] [address] [phone]                   Discharge Medications   Allergies as of 08/21/2020       Reactions   Benazepril Other (See Comments)    hyperkalemia   Fish Allergy Itching   Omega-3 Fatty Acids Hives, Itching        Medication List     STOP taking these medications    aspirin 81 MG tablet   nebivolol 2.5 MG tablet Commonly known as: Bystolic       TAKE these medications    albuterol 108 (90 Base) MCG/ACT inhaler Commonly known as: VENTOLIN HFA Inhale 2 puffs into the lungs every 6 (six) hours as needed for wheezing or shortness of breath.   Arnuity Ellipta 200 MCG/ACT Aepb Generic drug: Fluticasone Furoate Inhale 1 puff into the lungs daily.   Eliquis 5 MG Tabs tablet Generic drug: apixaban TAKE 1 TABLET BY MOUTH TWICE A DAY What changed: how much to take   Entresto 49-51 MG Generic drug: sacubitril-valsartan Take 1 tablet by mouth 2 (two) times daily. What changed: Another medication with the same name was removed. Continue taking this medication, and follow the directions you see here.   Jardiance 25 MG Tabs tablet Generic drug: empagliflozin TAKE 1 TABLET BY MOUTH EVERY DAY What changed: how much to take   Levemir FlexTouch 100 UNIT/ML FlexPen Generic drug: insulin detemir INJECT 46 UNITS INTO THE SKIN DAILY. DX:E11.9 What changed: See the new instructions.   metoprolol succinate 100 MG 24 hr tablet Commonly  known as: TOPROL-XL Take 1 tablet (100 mg total) by mouth daily. Take with or immediately following a meal. Start taking on: August 22, 2020   montelukast 10 MG tablet Commonly known as: SINGULAIR TAKE 1 TABLET BY MOUTH EVERY DAY What changed: when to take this   nitroGLYCERIN 0.4 MG SL tablet Commonly known as: NITROSTAT PLACE 1 TABLET (0.4 MG TOTAL) UNDER THE TONGUE EVERY 5 (FIVE) MINUTES AS NEEDED FOR CHEST PAIN.   NovoFine 32G X 6 MM Misc Generic drug: Insulin Pen Needle 1 each by Other route daily.   NovoLOG FlexPen 100 UNIT/ML FlexPen Generic drug: insulin aspart INJECT 5-20 UNITS SUBCUTANEOUSLY WITH LUNCH AND DINNER What changed:  how much to take how to take  this when to take this additional instructions   OneTouch Ultra test strip Generic drug: glucose blood USE TO CHECK BLOOD SUGAR 3 TIMES A DAY (E11.9)   rosuvastatin 20 MG tablet Commonly known as: CRESTOR TAKE 1 TABLET BY MOUTH EVERY DAY What changed: when to take this           Outstanding Labs/Studies   Moris Ratchford need repeat CXR in 3-4 weeks  Duration of Discharge Encounter   Greater than 30 minutes including physician time.  Signed, Abigail Butts, PA-C 08/21/2020, 10:28 AM    I have seen and examined this patient with John [name].  Agree with above, note added to reflect my findings.  On exam, RRR, no murmurs. Patient presented with VT. LHC without obstructive disease. S/p ICD yesterday. Discharge today on Toprol XL 100 mg. Discussed no driving for 6 months per [place] law due to DCCV for VT.    Koa Palla M. Eleena Grater MD 08/21/2020 10:42 AM

## 2020-08-21 NOTE — Discharge Instructions (Signed)
    Supplemental Discharge Instructions for  Pacemaker/Defibrillator Patients   Activity No heavy lifting or vigorous activity with your left/right arm for 6 to 8 weeks.  Do not raise your left/right arm above your head for one week.  Gradually raise your affected arm as drawn below.             08/29/20                     08/30/20                    08/31/20                   09/01/20 __  NO DRIVING 6 MONTHS.  WOUND CARE - Keep the wound area clean and dry.  Do not get this area wet , no showers until cleared to at your wound check visit. - The tape/steri-strips on your wound will fall off; do not pull them off.  No bandage is needed on the site.  DO  NOT apply any creams, oils, or ointments to the wound area. - If you notice any drainage or discharge from the wound, any swelling or bruising at the site, or you develop a fever > 101? F after you are discharged home, call the office at once.  Special Instructions - You are still able to use cellular telephones; use the ear opposite the side where you have your pacemaker/defibrillator.  Avoid carrying your cellular phone near your device. - When traveling through airports, show security personnel your identification card to avoid being screened in the metal detectors.  Ask the security personnel to use the hand wand. - Avoid arc welding equipment, MRI testing (magnetic resonance imaging), TENS units (transcutaneous nerve stimulators).  Call the office for questions about other devices. - Avoid electrical appliances that are in poor condition or are not properly grounded. - Microwave ovens are safe to be near or to operate.  Additional information for defibrillator patients should your device go off: - If your device goes off ONCE and you feel fine afterward, notify the device clinic nurses. - If your device goes off ONCE and you do not feel well afterward, call 911. - If your device goes off TWICE, call 911. - If your device goes off THREE  times in one day, call 911.  DO NOT DRIVE YOURSELF OR A FAMILY MEMBER WITH A DEFIBRILLATOR TO THE HOSPITAL--CALL 911.   PLEASE REMEMBER TO BRING ALL OF YOUR MEDICATIONS TO EACH OF YOUR FOLLOW-UP OFFICE VISITS.  PLEASE ATTEND ALL SCHEDULED FOLLOW-UP APPOINTMENTS.   Activity: Increase activity slowly as tolerated. You may shower, but no soaking baths (or swimming) for 1 week. No driving for 6 months. No lifting over 5 lbs for 1 week. No sexual activity for 1 week.   Wound Care: You may wash cath site gently with soap and water. Keep cath site clean and dry. If you notice pain, swelling, bleeding or pus at your cath site, please call [phone].  Medication changes: - STOP aspirin 81 mg daily to minimize your bleeding risk - STOP nebivolol - this medication is being replaced by metoprolol - START metoprolol succinate (Toprol-XL) 100mg  daily - RESTART apixaban on the evening of 08/21/20 as previously prescribed.

## 2020-08-21 NOTE — Plan of Care (Signed)

## 2020-08-21 NOTE — Progress Notes (Addendum)
Progress Note  Patient Name: John [name] Date of Encounter: 08/21/2020  [facility] Cardiologist: Shelva Majestic, MD   Subjective   Feeling well.  No chest pain or palpitations.  No shortness of breath.  Inpatient Medications    Scheduled Meds: . budesonide (PULMICORT) nebulizer solution  0.5 mg Nebulization BID  . Chlorhexidine Gluconate Cloth  6 each Topical Daily  . empagliflozin  25 mg Oral Daily  . mouth rinse  15 mL Mouth Rinse BID  . metoprolol succinate  100 mg Oral Daily  . montelukast  10 mg Oral q AM  . rosuvastatin  20 mg Oral Daily  . sacubitril-valsartan  1 tablet Oral BID  . sodium chloride flush  3 mL Intravenous Q12H  . sodium chloride flush  3 mL Intravenous Q12H   Continuous Infusions: . sodium chloride Stopped (08/21/20 0741)   PRN Meds: sodium chloride, acetaminophen, morphine injection, nitroGLYCERIN, ondansetron (ZOFRAN) IV, sodium chloride flush   Vital Signs    Vitals:   08/21/20 0655 08/21/20 0700 08/21/20 0812 08/21/20 0815  BP:  (!) 143/90  (!) 150/84  Pulse:  69  69  Resp:  17  20  Temp: 97.6 F (36.4 C)     TempSrc: Oral     SpO2:  96% 96% 95%  Weight:      Height:        Intake/Output Summary (Last 24 hours) at 08/21/2020 0933 Last data filed at 08/21/2020 0800 Gross per 24 hour  Intake 562.22 ml  Output 700 ml  Net -137.78 ml   Last 3 Weights 08/18/2020 08/02/2020 03/12/2020  Weight (lbs) 222 lb 223 lb 4 oz 222 lb 12.8 oz  Weight (kg) 100.699 kg 101.266 kg 101.061 kg      Telemetry    Atrial paced with PVCs- Personally Reviewed  ECG    Atrial paced, PVCs- Personally Reviewed  Physical Exam   GEN: Well nourished, well developed, in no acute distress  HEENT: normal  Neck: no JVD, carotid bruits, or masses Cardiac: RRR; no murmurs, rubs, or gallops,no edema  Respiratory:  clear to auscultation bilaterally, normal work of breathing GI: soft, nontender, nondistended, + BS MS: no deformity or atrophy  Skin: warm and  dry, device site well healed Neuro:  Strength and sensation are intact Psych: euthymic mood, full affect;  Labs    High Sensitivity Troponin:   Recent Labs  Lab 08/18/20 2040 08/18/20 2208 08/19/20 0026 08/19/20 0456 08/19/20 0555  TROPONINIHS 184* 221* 172* 142* 165*      Chemistry Recent Labs  Lab 08/18/20 2040 08/20/20 0119  NA 139 141  K 3.7 4.1  CL 106 112*  CO2 23 23  GLUCOSE 137* 166*  BUN 29* 21  CREATININE 1.30* 1.17  CALCIUM 9.1 8.4*  PROT 6.2*  --   ALBUMIN 3.7  --   AST 28  --   ALT 26  --   ALKPHOS 62  --   BILITOT 1.0  --   GFRNONAA 59* >60  ANIONGAP 10 6     Hematology Recent Labs  Lab 08/19/20 0456 08/20/20 0119 08/21/20 0103  WBC 6.3 6.8 6.6  RBC 5.33 5.40 5.36  HGB 16.0 16.1 16.0  HCT 50.3 51.4 50.1  MCV 94.4 95.2 93.5  MCH 30.0 29.8 29.9  MCHC 31.8 31.3 31.9  RDW 13.6 13.4 13.2  PLT 124* 113* 116*    BNPNo results for input(s): BNP, PROBNP in the last 168 hours.   DDimer No results  for input(s): DDIMER in the last 168 hours.   Radiology      Cardiac Studies   08/19/20: LHC  Prox RCA to Dist RCA lesion is 15% stenosed.  Ost RCA to Prox RCA lesion is 40% stenosed.  Ramus lesion is 60% stenosed.  Dist RCA lesion is 45% stenosed. Mr. Goedken has known disease and underwent diagnostic coronary angiography by Dr. Ellyn Hack in September 2021.  He had noncritical CAD with moderate proximal RCA and ramus branch stenosis both of which were shown to be not physiologically significant by DFR.  He presented with chest pain and monomorphic VT at 220 bpm.  His enzymes rose mildly suspect related to demand ischemia.  His anatomy is unchanged from his previous cath.  His EKG on presentation suggested "scar VT".  And EP evaluation would be warranted.  Continue medical therapy is recommended.  The sheath was removed and a TR band was placed on the right wrist to achieve patent hemostasis.  The patient did receive his Eliquis last dose yesterday  morning.  Heparin Anayia Eugene be restarted 4 hours after sheath removal without a bolus.  The patient left lab in stable condition. [name]. MD, [facility] 08/19/2020 9:04 AM        03/01/2020: TTE IMPRESSIONS  1. Left ventricular ejection fraction, by estimation, is 45 to 50%. The  left ventricle has mildly decreased function. The left ventricle  demonstrates global hypokinesis. Left ventricular diastolic parameters are  indeterminate.   2. Right ventricular systolic function is normal. The right ventricular  size is normal.   3. Left atrial size was severely dilated.   4. The mitral valve is normal in structure. Trivial mitral valve  regurgitation. No evidence of mitral stenosis.   5. The aortic valve is tricuspid. Aortic valve regurgitation is not  visualized. No aortic stenosis is present.   6. Aortic dilatation noted. There is mild dilatation of the aortic root,  measuring 41 mm.   7. The inferior vena cava is dilated in size with >50% respiratory  variability, suggesting right atrial pressure of 8 mmHg.        05/06/2015 nuclear stress test with an ejection fraction of 31%.  There was a large fixed inferior and inferoseptal wall defect suggestive of scar and evidence for inferoseptal akinesis.  There was no ischemia  Patient Profile     70 y.o. male with a hx of CAD(multiple prior PCIs reported in the RCA and Cx, last stenting in RCA 2010), ICM, HTN, HLD, DM, OSA (w/CPAP), TAA, ILD, quit smoking 2019, AFlutter, back surgery resulting in L foot drop, chronic back pain, admitted with VT  Assessment & Plan    1. VT left heart catheterization with stable coronary disease.  No PCI performed.  Ejection fraction 45 to 50%.  Likely scar mediated.  Status post [employer] ICD.  Device functioning appropriately.  Okay for discharge.  Plan for discharge on Toprol-XL 100 mg.   2. Hx of AFlutter      CHA2DS2-VASc of 5.  Currently on Eliquis.  Can start later this evening.  Is a patient did have a  cardioversion, I did discuss with him no driving for [place] law for the next 6 months.  For questions or updates, please contact [place] Please consult [email or web address] for contact info under        Signed, Calea Hribar Meredith Leeds, MD  08/21/2020, 9:33 AM

## 2020-08-21 NOTE — Plan of Care (Signed)
  Problem: Education: Goal: Knowledge of General Education information will improve Description: Including pain rating scale, medication(s)/side effects and non-pharmacologic comfort measures Outcome: Adequate for Discharge   Problem: Health Behavior/Discharge Planning: Goal: Ability to manage health-related needs will improve Outcome: Adequate for Discharge   Problem: Clinical Measurements: Goal: Ability to maintain clinical measurements within normal limits will improve Outcome: Adequate for Discharge Goal: Will remain free from infection Outcome: Adequate for Discharge Goal: Diagnostic test results will improve Outcome: Adequate for Discharge Goal: Respiratory complications will improve Outcome: Adequate for Discharge Goal: Cardiovascular complication will be avoided Outcome: Adequate for Discharge   Problem: Activity: Goal: Risk for activity intolerance will decrease Outcome: Adequate for Discharge   Problem: Nutrition: Goal: Adequate nutrition will be maintained Outcome: Adequate for Discharge   Problem: Coping: Goal: Level of anxiety will decrease Outcome: Adequate for Discharge   Problem: Elimination: Goal: Will not experience complications related to bowel motility Outcome: Adequate for Discharge Goal: Will not experience complications related to urinary retention Outcome: Adequate for Discharge   Problem: Pain Managment: Goal: General experience of comfort will improve Outcome: Adequate for Discharge   Problem: Safety: Goal: Ability to remain free from injury will improve Outcome: Adequate for Discharge

## 2020-08-22 ENCOUNTER — Other Ambulatory Visit: Payer: Self-pay | Admitting: Family Medicine

## 2020-08-23 ENCOUNTER — Encounter (HOSPITAL_COMMUNITY): Payer: Self-pay | Admitting: Cardiology

## 2020-08-23 MED FILL — Medication: Qty: 1 | Status: AC

## 2020-08-24 ENCOUNTER — Telehealth: Payer: Self-pay | Admitting: Cardiology

## 2020-08-24 ENCOUNTER — Telehealth: Payer: Self-pay | Admitting: *Deleted

## 2020-08-24 ENCOUNTER — Telehealth: Payer: Self-pay | Admitting: Cardiovascular Disease

## 2020-08-24 NOTE — Telephone Encounter (Signed)
    Pt said last Sunday he had an episode where his monitor went off. He said since then he's been having irregular heart beat, he said today is worst. His heart skipped a beat sometimes 1 every 4 beats and sometimes 1 every 10-11 beats. He would like to speak with Dr. Curt Bears nurse

## 2020-08-24 NOTE — Telephone Encounter (Signed)
TOC appt scheduled for 03/15 at 9:15am with Almyra Deforest.

## 2020-08-24 NOTE — Telephone Encounter (Signed)
-----   Message from Abigail Butts, PA-C sent at 08/21/2020 10:27 AM EST ----- Regarding: TOC visit Please schedule this patient for a follow-up appointment and call them with that information.  Primary Cardiologist: Dr. Claiborne Billings Date of Discharge: 08/21/2020 Appointment Needed Within: 1-2 weeks Appointment Type: TOC visit with Dr. Senaida Ores in 1-2 weeks  Thank you!  Roby Lofts, PA-C

## 2020-08-24 NOTE — Telephone Encounter (Signed)
Patient called concerning his heart rate has been out of rhythm since his device was implanted. Reports Sunday evening he was sitting down and felt a "jolt, and everything went black." Denies symptoms prior to event or after. Reports while on the phone he felt like he couldn't take a deep breath in during conversation, patient has NAD noted.   Transmission received and reviewed. Presenting AP/VS 78 bpm. 1 VT event logged 08/22/20 @ 21:42 pm, duration 12 seconds was treated with ATP x1 ~ successfully.   Advised patient per [place] DMV he was advised not to drive x6 months with start date of 08/22/20. Shock plan reviewed with patient. Advised I will forward to Dr. Curt Bears and we will call with any changes or recommendations.

## 2020-08-24 NOTE — Telephone Encounter (Signed)
See device telephone note from today for further documentation on this matter

## 2020-08-24 NOTE — Telephone Encounter (Signed)
Lm to call back ./cy 

## 2020-08-24 NOTE — Telephone Encounter (Signed)
TOC call to patient.Left message on personal voice mail to call back.

## 2020-08-25 NOTE — Telephone Encounter (Addendum)
Pt scheduled to see Dr. Quentin Ore next Tuesday, per [facility], to further discuss next step in treatment plan. Patient verbalized understanding and agreeable to plan.  Pt aware to come to [facility] office for this appt  Aware I will discuss wound check appt next week with device clinic and see if we can work it out for him to have done same day as [employer]. Aware I will let him know Friday.

## 2020-08-25 NOTE — Telephone Encounter (Signed)
Patient contacted regarding discharge from [facility] on 03/05.  Patient understands to follow up with provider Almyra Deforest, PA-C on 03/15 at 9:15 AM at [facility] office. Patient understands discharge instructions? Yes Patient understands medications and regiment? Yes Patient understands to bring all medications to this visit? Yes

## 2020-08-25 NOTE — Telephone Encounter (Signed)
TOC call attempt #2- LVM to call back to discuss.  Left call back number.

## 2020-08-25 NOTE — Telephone Encounter (Signed)
Follow up:     Patient returning a call back. Please call patient.

## 2020-08-27 NOTE — Telephone Encounter (Signed)
Pt aware that wound check cancelled for next Thursday, will address wound check while seeing Dr. Quentin Ore on Tuesday. Patient verbalized understanding and agreeable to plan.

## 2020-08-31 ENCOUNTER — Ambulatory Visit: Payer: Medicare Other | Admitting: Physician Assistant

## 2020-08-31 ENCOUNTER — Encounter: Payer: Self-pay | Admitting: Cardiology

## 2020-08-31 ENCOUNTER — Other Ambulatory Visit: Payer: Self-pay

## 2020-08-31 ENCOUNTER — Ambulatory Visit (INDEPENDENT_AMBULATORY_CARE_PROVIDER_SITE_OTHER): Payer: Medicare Other | Admitting: Cardiology

## 2020-08-31 VITALS — BP 144/88 | HR 88 | Ht 72.0 in | Wt 217.0 lb

## 2020-08-31 DIAGNOSIS — I5042 Chronic combined systolic (congestive) and diastolic (congestive) heart failure: Secondary | ICD-10-CM | POA: Diagnosis not present

## 2020-08-31 DIAGNOSIS — I483 Typical atrial flutter: Secondary | ICD-10-CM

## 2020-08-31 DIAGNOSIS — I472 Ventricular tachycardia, unspecified: Secondary | ICD-10-CM

## 2020-08-31 MED ORDER — METOPROLOL SUCCINATE ER 100 MG PO TB24: 100.0000 mg | ORAL_TABLET | Freq: Every day | ORAL | 2 refills | Status: DC

## 2020-08-31 MED ORDER — METOPROLOL SUCCINATE ER 50 MG PO TB24: 50.0000 mg | ORAL_TABLET | Freq: Every day | ORAL | 1 refills | Status: DC

## 2020-08-31 NOTE — Patient Instructions (Addendum)
Medication Instructions:  Your physician has recommended you make the following change in your medication:  1. INCREASE your Toprol  - take 100 mg in the morning  - take 50 mg in the evening  *If you need a refill on your cardiac medications before your next appointment, please call your pharmacy*   Lab Work: None ordered   Testing/Procedures: None ordered   Follow-Up: At [facility], you and your health needs are our priority.  As part of our continuing mission to provide you with exceptional heart care, we have created designated Provider Care Teams.  These Care Teams include your primary Cardiologist (physician) and Advanced Practice Providers (APPs -  Physician Assistants and Nurse Practitioners) who all work together to provide you with the care you need, when you need it.  Remote monitoring is used to monitor your Pacemaker or ICD from home. This monitoring reduces the number of office visits required to check your device to one time per year. It allows [place] to keep an eye on the functioning of your device to ensure it is working properly. You are scheduled for a device check from home on 11/19/2020. You may send your transmission at any time that day. If you have a wireless device, the transmission will be sent automatically. After your physician reviews your transmission, you will receive a postcard with your next transmission date.  Your next appointment:   6 month(s)  The format for your next appointment:   In Person  Provider:   Lars Mage, MD   Keep you appointment with Dr. Curt Bears on 11/26/2020   Thank you for choosing [facility]!!   [phone]

## 2020-08-31 NOTE — Progress Notes (Signed)
Electrophysiology Office Follow up Visit Note:    Date:  08/31/2020   ID:  John [name], John [name] Jan 25, 1951, MRN [id number]  PCP:  [name], MD  [facility] Cardiologist:  Shelva Majestic, MD  [facility] Electrophysiologist:  Vickie Epley, MD    Interval History:    John [name] is a 70 y.o. male who presents for a follow up visit after ICD implant August 20, 2020.  ICD was implanted for an episode of sustained monomorphic ventricular tachycardia.  He has a history of significant coronary artery disease.  Since implant, the patient tells me he has been doing well.  Site is healing well.     Past Medical History:  Diagnosis Date  . Atrial flutter ([place])    s/p cardioversion  . Coronary artery disease   . Diabetes mellitus   . GERD (gastroesophageal reflux disease)   . History of nuclear stress test 04/04/2011   lexiscan; mod-large in size fixed inferolateral defect (scar); non-diagnostic for ischemia; low risk scan   . Hyperlipidemia   . Hypertension   . Left foot drop    r/t past disk srugery - uses Kevlar brace  . Myocardial infarction ([place])    posterior MI  . Shortness of breath   . Sleep apnea    on CPAP; 04/28/2007 split-night - AHI during total sleep 44.43/hr and REM 72.56/hr    Past Surgical History:  Procedure Laterality Date  . BACK SURGERY  1985  . CARDIAC CATHETERIZATION  2010   6 stents total  . CARDIAC CATHETERIZATION  01/2000   percutaneous transluminal coronary balloon angioplasty of mid RCA stenotic lesion  . CARDIAC CATHETERIZATION  06/2006   no stenting; ischemic cardiomyopathy, EF 40-45%  . CARDIOVERSION N/A 07/28/2016   Procedure: CARDIOVERSION;  Surgeon: Troy Sine, MD;  Location: [place];  Service: Cardiovascular;  Laterality: N/A;  . CORONARY ANGIOPLASTY  09/1998   mid-distal RCA balloon dilatation, 4.5 & 5.0 stents   . CORONARY ANGIOPLASTY WITH STENT PLACEMENT  03/1994   angioplasty & stenting (non-DES) of  circumflex/prox ramus intermedius  . CORONARY ANGIOPLASTY WITH STENT PLACEMENT  10/1994   large iliac [id number] stent to RCA  . CORONARY ANGIOPLASTY WITH STENT PLACEMENT  12/2002   4.68mm stents x2 of RCA  . CORONARY ANGIOPLASTY WITH STENT PLACEMENT  01/2005   cutting balloon arthrectomy of distal RCA & Cypher DES 3.5x13; cutting balloon arthrectomy of mid RCA with Cypher DES 3.5x18  . CORONARY ANGIOPLASTY WITH STENT PLACEMENT  11/2008   stenting of mid RCA with 4.0x67mm driver, non-DES  . ICD IMPLANT N/A 08/20/2020   Procedure: ICD IMPLANT;  Surgeon: Constance Haw, MD;  Location: [place] [facility];  Service: Cardiovascular;  Laterality: N/A;  . INTRAVASCULAR PRESSURE WIRE/FFR STUDY N/A 03/02/2020   Procedure: INTRAVASCULAR PRESSURE WIRE/FFR STUDY;  Surgeon: Leonie Man, MD;  Location: [place] [facility];  Service: Cardiovascular;  Laterality: N/A;  . LEFT HEART CATH AND CORONARY ANGIOGRAPHY N/A 03/02/2020   Procedure: LEFT HEART CATH AND CORONARY ANGIOGRAPHY;  Surgeon: Leonie Man, MD;  Location: [place] [facility];  Service: Cardiovascular;  Laterality: N/A;  . LEFT HEART CATH AND CORONARY ANGIOGRAPHY N/A 08/19/2020   Procedure: LEFT HEART CATH AND CORONARY ANGIOGRAPHY;  Surgeon: Lorretta Harp, MD;  Location: [place] [facility];  Service: Cardiovascular;  Laterality: N/A;  . LEFT HEART CATHETERIZATION WITH CORONARY ANGIOGRAM N/A 02/27/2012   Procedure: LEFT HEART CATHETERIZATION WITH CORONARY ANGIOGRAM;  Surgeon: Roderic Palau  Adora Fridge, MD;  Location: [place] C[facility];  Service: Cardiovascular;  Laterality: N/A;  . TRANSTHORACIC ECHOCARDIOGRAM  07/29/2010   EF 50=55%, mod inf wall hypokinesis & mild post wall hypokinesis; LA mild-mod dilated; mild mitral annular calcif & mild MR; mild TR & elevated RV systolic pressure; AV mildly sclerotic; mild aortic root dilatation     Current Medications: Current Meds  Medication Sig  . albuterol (VENTOLIN HFA) 108 (90 Base) MCG/ACT inhaler Inhale 2 puffs  into the lungs every 6 (six) hours as needed for wheezing or shortness of breath.  John [name] 5 MG TABS tablet TAKE 1 TABLET BY MOUTH TWICE A DAY (Patient taking differently: Take 5 mg by mouth 2 (two) times daily.)  . Fluticasone Furoate (ARNUITY ELLIPTA) 200 MCG/ACT AEPB Inhale 1 puff into the lungs daily.  . insulin aspart (NOVOLOG FLEXPEN) 100 UNIT/ML FlexPen INJECT 5-20 UNITS SUBCUTANEOUSLY WITH LUNCH AND DINNER (Patient taking differently: Inject 5-20 Units into the skin See admin instructions. LUNCH AND DINNER)  . Insulin Pen Needle (NOVOFINE) 32G X 6 MM MISC 1 each by Other route daily.  John [name] JARDIANCE 25 MG TABS tablet TAKE 1 TABLET BY MOUTH EVERY DAY (Patient taking differently: Take 25 mg by mouth daily.)  . LEVEMIR FLEXTOUCH 100 UNIT/ML FlexPen INJECT 46 UNITS INTO THE SKIN DAILY. DX:E11.9  . metoprolol succinate (TOPROL-XL) 50 MG 24 hr tablet Take 1 tablet (50 mg total) by mouth daily. Take with or immediately following a meal.  Take in the evening  . montelukast (SINGULAIR) 10 MG tablet TAKE 1 TABLET BY MOUTH EVERY DAY  . nitroGLYCERIN (NITROSTAT) 0.4 MG SL tablet PLACE 1 TABLET (0.4 MG TOTAL) UNDER THE TONGUE EVERY 5 (FIVE) MINUTES AS NEEDED FOR CHEST PAIN.  John [name] ONETOUCH ULTRA test strip USE TO CHECK BLOOD SUGAR 3 TIMES A DAY (E11.9)  . rosuvastatin (CRESTOR) 20 MG tablet TAKE 1 TABLET BY MOUTH EVERY DAY (Patient taking differently: Take 20 mg by mouth at bedtime.)  . sacubitril-valsartan (ENTRESTO) 49-51 MG Take 1 tablet by mouth 2 (two) times daily.  . [DISCONTINUED] metoprolol succinate (TOPROL-XL) 100 MG 24 hr tablet Take 1 tablet (100 mg total) by mouth daily. Take with or immediately following a meal.     Allergies:   Benazepril, Fish allergy, and Omega-3 fatty acids   Social History   Socioeconomic History  . Marital status: Married    Spouse name: Not on file  . Number of children: 3  . Years of education: Not on file  . Highest education level: Not on file  Occupational  History  . 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 [place]  Tobacco Use  . Smoking status: Former Smoker    Packs/day: 1.00    Years: 50.00    Pack years: 50.00    Types: Cigarettes    Quit date: 07/20/2016    Years since quitting: 4.1  . Smokeless tobacco: Never Used  Substance and Sexual Activity  . Alcohol use: Not Currently    Alcohol/week: 0.0 standard drinks  . Drug use: No  . Sexual activity: Yes  Other Topics Concern  . Not on file  Social History Narrative  . Not on file   Social Determinants of Health   Financial Resource Strain: Not on file  Food Insecurity: Not on file  Transportation Needs: Not on file  Physical Activity: Not on file  Stress: Not on file  Social Connections: Not on file  Family History: The patient's family history includes Heart attack in his father.  ROS:   Please see the history of present illness.    All other systems reviewed and are negative.  EKGs/Labs/Other Studies Reviewed:    The following studies were reviewed today:  August 31, 2020 in clinic device interrogation personally reviewed Good battery longevity, stable lead parameters 1 episode of paced terminated ventricular tachycardia.  Review of the EGM showed a monomorphic ventricular tachycardia that received 1 8 pulse burst of ATP followed by successful termination of the rhythm.  The cycle length of the tachycardia prior to ATP was 280 ms.  After ATP it was 260 ms for 8 beats and then it terminated. 90% a paced-V sense 5.6% V paced  EKG:  The ekg ordered today demonstrates atrially paced, ventricular sensed rhythm.  Frequent PVCs.  Recent Labs: 08/18/2020: ALT 26; Magnesium 2.1 08/19/2020: TSH 0.913 08/20/2020: BUN 21; Creatinine, Ser 1.17; Potassium 4.1; Sodium 141 08/21/2020: Hemoglobin 16.0; Platelets 116  Recent Lipid Panel    Component Value Date/Time   CHOL 115 02/28/2020 1910   CHOL 115 01/24/2019 0930   TRIG 93 02/28/2020 1910    HDL 35 (L) 02/28/2020 1910   HDL 33 (L) 01/24/2019 0930   CHOLHDL 3.3 02/28/2020 1910   VLDL 19 02/28/2020 1910   LDLCALC 61 02/28/2020 1910   LDLCALC 50 09/15/2019 1538    Physical Exam:    VS:  BP (!) 144/88   Pulse 88   Ht 6' (1.829 m)   Wt 217 lb (98.4 kg)   BMI 29.43 kg/m     Wt Readings from Last 3 Encounters:  08/31/20 217 lb (98.4 kg)  08/18/20 222 lb (100.7 kg)  08/02/20 223 lb 4 oz (101.3 kg)     GEN:  Well nourished, well developed in no acute distress HEENT: Normal NECK: No JVD; No carotid bruits LYMPHATICS: No lymphadenopathy CARDIAC: RRR, no murmurs, rubs, gallops ICD pocket well-healed RESPIRATORY:  Clear to auscultation without rales, wheezing or rhonchi  ABDOMEN: Soft, non-tender, non-distended MUSCULOSKELETAL:  No edema; No deformity  SKIN: Warm and dry NEUROLOGIC:  Alert and oriented x 3 PSYCHIATRIC:  Normal affect   ASSESSMENT:    1. Chronic combined systolic and diastolic congestive heart failure ([place])   2. Typical atrial flutter ([place])   3. VT (ventricular tachycardia) ([facility])    PLAN:    In order of problems listed above:  1. Chronic combined systolic and diastolic heart failure Warm and dry on exam.  Now post ICD implant.  Continue medical therapy including Entresto, Toprol, Jardiance.  2.  Ventricular tachycardia Now post ICD implant.  Has received 1 burst of ATP which did not immediately terminate the VT. To start, I like to increase metoprolol given his ectopy on today's EKG and device interrogation.  He will take 100 mg of Toprol in the morning and 50 in the evening.  I will plan to see him back in 6 months.  If the patient's ectopy increases in frequency or if he receives more ICD therapies, we will need to consider antiarrhythmic.  He is not a good candidate for amiodarone given a history of interstitial lung disease.  3.  Atrial flutter Continue Eliquis.  Metoprolol as above  Medication Adjustments/Labs and Tests Ordered: Current  medicines are reviewed at length with the patient today.  Concerns regarding medicines are outlined above.  Orders Placed This Encounter  Procedures  . EKG 12-Lead   Meds ordered this encounter  Medications  .  metoprolol succinate (TOPROL-XL) 50 MG 24 hr tablet    Sig: Take 1 tablet (50 mg total) by mouth daily. Take with or immediately following a meal.  Take in the evening    Dispense:  90 tablet    Refill:  1  . metoprolol succinate (TOPROL-XL) 100 MG 24 hr tablet    Sig: Take 1 tablet (100 mg total) by mouth daily. Take with or immediately following a meal. Take in the morning    Dispense:  90 tablet    Refill:  2     Signed, Lars Mage, MD, [facility]  08/31/2020 8:58 PM    Electrophysiology [facility]

## 2020-09-01 ENCOUNTER — Encounter: Payer: Self-pay | Admitting: Family Medicine

## 2020-09-01 ENCOUNTER — Telehealth: Payer: Self-pay | Admitting: Cardiology

## 2020-09-01 ENCOUNTER — Other Ambulatory Visit: Payer: Self-pay

## 2020-09-01 ENCOUNTER — Ambulatory Visit (INDEPENDENT_AMBULATORY_CARE_PROVIDER_SITE_OTHER): Payer: Medicare Other | Admitting: Family Medicine

## 2020-09-01 VITALS — BP 146/80 | HR 96 | Temp 98.1°F | Resp 16 | Ht 72.0 in | Wt 219.0 lb

## 2020-09-01 DIAGNOSIS — M79601 Pain in right arm: Secondary | ICD-10-CM | POA: Diagnosis not present

## 2020-09-01 MED ORDER — OXYCODONE-ACETAMINOPHEN 5-325 MG PO TABS: 1.0000 | ORAL_TABLET | ORAL | 0 refills | Status: DC | PRN

## 2020-09-01 NOTE — Progress Notes (Signed)
Subjective:    Patient ID: John [name], male    DOB: Jan 07, 1951, 70 y.o.   MRN: [id number]  Patient presents today complaining of shooting pain in his right arm.  He was recently admitted to the hospital having an episode of ventricular tachycardia.  He underwent cardioversion and was transferred to [facility] where he had a repeat catheterization.  He was found to have stable coronary artery disease but had an ICD placed.  He had several IVs put in his right antecubital fossa.  He also had several catheterizations in his right radial artery.  He reports burning electrical type pain shooting pain that wakes him up 3 and 4 times every night.  It starts at his wrist and radiates up his arm.  There is yellowish discoloration on the medial palmar surface of his wrist and forearm.  There is no erythema.  There is no warmth to suggest cellulitis.  There are no vesicles to suggest shingles.  There is no swelling to suggest a DVT.  There is no swelling in his elbow.  There is no swelling in his bicep.  The arm is not red hot or swollen.  Therefore I believe the pain is likely neuropathic pain due to potential nerve irritation near the catheterization site. Past Medical History:  Diagnosis Date  . Atrial flutter ([place])    s/p cardioversion  . Coronary artery disease   . Diabetes mellitus   . GERD (gastroesophageal reflux disease)   . History of nuclear stress test 04/04/2011   lexiscan; mod-large in size fixed inferolateral defect (scar); non-diagnostic for ischemia; low risk scan   . Hyperlipidemia   . Hypertension   . Left foot drop    r/t past disk srugery - uses Kevlar brace  . Myocardial infarction ([place])    posterior MI  . Shortness of breath   . Sleep apnea    on CPAP; 04/28/2007 split-night - AHI during total sleep 44.43/hr and REM 72.56/hr   Past Surgical History:  Procedure Laterality Date  . BACK SURGERY  1985  . CARDIAC CATHETERIZATION  2010   6 stents total  . CARDIAC  CATHETERIZATION  01/2000   percutaneous transluminal coronary balloon angioplasty of mid RCA stenotic lesion  . CARDIAC CATHETERIZATION  06/2006   no stenting; ischemic cardiomyopathy, EF 40-45%  . CARDIOVERSION N/A 07/28/2016   Procedure: CARDIOVERSION;  Surgeon: Troy Sine, MD;  Location: [place];  Service: Cardiovascular;  Laterality: N/A;  . CORONARY ANGIOPLASTY  09/1998   mid-distal RCA balloon dilatation, 4.5 & 5.0 stents   . CORONARY ANGIOPLASTY WITH STENT PLACEMENT  03/1994   angioplasty & stenting (non-DES) of circumflex/prox ramus intermedius  . CORONARY ANGIOPLASTY WITH STENT PLACEMENT  10/1994   large iliac [id number] stent to RCA  . CORONARY ANGIOPLASTY WITH STENT PLACEMENT  12/2002   4.11mm stents x2 of RCA  . CORONARY ANGIOPLASTY WITH STENT PLACEMENT  01/2005   cutting balloon arthrectomy of distal RCA & Cypher DES 3.5x13; cutting balloon arthrectomy of mid RCA with Cypher DES 3.5x18  . CORONARY ANGIOPLASTY WITH STENT PLACEMENT  11/2008   stenting of mid RCA with 4.0x39mm driver, non-DES  . ICD IMPLANT N/A 08/20/2020   Procedure: ICD IMPLANT;  Surgeon: Constance Haw, MD;  Location: [place] [facility];  Service: Cardiovascular;  Laterality: N/A;  . INTRAVASCULAR PRESSURE WIRE/FFR STUDY N/A 03/02/2020   Procedure: INTRAVASCULAR PRESSURE WIRE/FFR STUDY;  Surgeon: Leonie Man, MD;  Location: [place] [facility];  Service:  Cardiovascular;  Laterality: N/A;  . LEFT HEART CATH AND CORONARY ANGIOGRAPHY N/A 03/02/2020   Procedure: LEFT HEART CATH AND CORONARY ANGIOGRAPHY;  Surgeon: Leonie Man, MD;  Location: [place] [facility];  Service: Cardiovascular;  Laterality: N/A;  . LEFT HEART CATH AND CORONARY ANGIOGRAPHY N/A 08/19/2020   Procedure: LEFT HEART CATH AND CORONARY ANGIOGRAPHY;  Surgeon: Lorretta Harp, MD;  Location: [place] [facility];  Service: Cardiovascular;  Laterality: N/A;  . LEFT HEART CATHETERIZATION WITH CORONARY ANGIOGRAM N/A 02/27/2012   Procedure: LEFT  HEART CATHETERIZATION WITH CORONARY ANGIOGRAM;  Surgeon: Lorretta Harp, MD;  [facility]: [facility];  Service: Cardiovascular;  Laterality: N/A;  . TRANSTHORACIC ECHOCARDIOGRAM  07/29/2010   EF 50=55%, mod inf wall hypokinesis & mild post wall hypokinesis; LA mild-mod dilated; mild mitral annular calcif & mild MR; mild TR & elevated RV systolic pressure; AV mildly sclerotic; mild aortic root dilatation    Current Outpatient Medications on File Prior to Visit  Medication Sig Dispense Refill  . albuterol (VENTOLIN HFA) 108 (90 Base) MCG/ACT inhaler Inhale 2 puffs into the lungs every 6 (six) hours as needed for wheezing or shortness of breath. 1 each 6  . ELIQUIS 5 MG TABS tablet TAKE 1 TABLET BY MOUTH TWICE A DAY (Patient taking differently: Take 5 mg by mouth 2 (two) times daily.) 60 tablet 5  . Fluticasone Furoate (ARNUITY ELLIPTA) 200 MCG/ACT AEPB Inhale 1 puff into the lungs daily. 1 each 3  . insulin aspart (NOVOLOG FLEXPEN) 100 UNIT/ML FlexPen INJECT 5-20 UNITS SUBCUTANEOUSLY WITH LUNCH AND DINNER (Patient taking differently: Inject 5-20 Units into the skin See admin instructions. LUNCH AND DINNER) 15 mL 1  . Insulin Pen Needle (NOVOFINE) 32G X 6 MM MISC 1 each by Other route daily. 100 each 3  . JARDIANCE 25 MG TABS tablet TAKE 1 TABLET BY MOUTH EVERY DAY (Patient taking differently: Take 25 mg by mouth daily.) 30 tablet 5  . LEVEMIR FLEXTOUCH 100 UNIT/ML FlexPen INJECT 46 UNITS INTO THE SKIN DAILY. DX:E11.9 15 mL 3  . metoprolol succinate (TOPROL-XL) 100 MG 24 hr tablet Take 1 tablet (100 mg total) by mouth daily. Take with or immediately following a meal. Take in the morning 90 tablet 2  . metoprolol succinate (TOPROL-XL) 50 MG 24 hr tablet Take 1 tablet (50 mg total) by mouth daily. Take with or immediately following a meal.  Take in the evening 90 tablet 1  . montelukast (SINGULAIR) 10 MG tablet TAKE 1 TABLET BY MOUTH EVERY DAY 90 tablet 3  . nitroGLYCERIN (NITROSTAT) 0.4 MG SL tablet  PLACE 1 TABLET (0.4 MG TOTAL) UNDER THE TONGUE EVERY 5 (FIVE) MINUTES AS NEEDED FOR CHEST PAIN. 25 tablet 1  . ONETOUCH ULTRA test strip USE TO CHECK BLOOD SUGAR 3 TIMES A DAY (E11.9) 100 strip 2  . rosuvastatin (CRESTOR) 20 MG tablet TAKE 1 TABLET BY MOUTH EVERY DAY (Patient taking differently: Take 20 mg by mouth at bedtime.) 90 tablet 3  . sacubitril-valsartan (ENTRESTO) 49-51 MG Take 1 tablet by mouth 2 (two) times daily.     No current facility-administered medications on file prior to visit.   Allergies  Allergen Reactions  . Benazepril Other (See Comments)    hyperkalemia  . Fish Allergy Itching  . Omega-3 Fatty Acids Hives and Itching   Social History   Socioeconomic History  . Marital status: Married    Spouse name: Not on file  . Number of children: 3  . Years  of education: Not on file  . Highest education level: Not on file  Occupational History  . 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 [place]  Tobacco Use  . Smoking status: Former Smoker    Packs/day: 1.00    Years: 50.00    Pack years: 50.00    Types: Cigarettes    Quit date: 07/20/2016    Years since quitting: 4.1  . Smokeless tobacco: Never Used  Substance and Sexual Activity  . Alcohol use: Not Currently    Alcohol/week: 0.0 standard drinks  . Drug use: No  . Sexual activity: Yes  Other Topics Concern  . Not on file  Social History Narrative  . Not on file   Social Determinants of Health   Financial Resource Strain: Not on file  Food Insecurity: Not on file  Transportation Needs: Not on file  Physical Activity: Not on file  Stress: Not on file  Social Connections: Not on file  Intimate Partner Violence: Not on file      Review of Systems  All other systems reviewed and are negative.      Objective:   Physical Exam Vitals reviewed.  Constitutional:      General: He is not in acute distress.    Appearance: He is well-developed. He is not diaphoretic.   HENT:     Right Ear: External ear normal.     Left Ear: External ear normal.     Nose: Nose normal.     Mouth/Throat:     Pharynx: No oropharyngeal exudate.  Neck:     Thyroid: No thyromegaly.  Cardiovascular:     Rate and Rhythm: Normal rate and regular rhythm.     Heart sounds: Normal heart sounds. No murmur heard.   Pulmonary:     Effort: Pulmonary effort is normal. No respiratory distress.     Breath sounds: Normal breath sounds. No wheezing or rales.  Abdominal:     General: Bowel sounds are normal. There is no distension.     Palpations: Abdomen is soft. There is no mass.     Tenderness: There is no abdominal tenderness. There is no guarding or rebound.  Musculoskeletal:     Right upper arm: No swelling or edema.     Left upper arm: No swelling or edema.     Right elbow: No swelling or deformity.     Left elbow: No swelling or deformity.     Right forearm: No swelling, edema, tenderness or bony tenderness.     Left forearm: No swelling, edema, tenderness or bony tenderness.     Right wrist: Normal. No swelling or tenderness. Normal range of motion.     Left wrist: No swelling or tenderness. Normal range of motion.     Cervical back: Neck supple.  Lymphadenopathy:     Cervical: No cervical adenopathy.           Assessment & Plan:  Right arm pain  Differential diagnosis includes DVT due to IVs, arterial injury due to catheterization, cellulitis, shingles versus neuropathic pain.  There is no physical signs of cellulitis or DVT.  He has normal palpable radial pulses and ulnar pulses with normal capillary refill.  The right hand is neurovascularly intact.  He has normal strength in his right upper arm although he does feel that the arm is a little bit numb.  I believe this is most likely due to some nerve irritation around the catheterization  site.  I would recommend symptomatic management at the present time with pain medication if necessary.  This keeps him awake at  night.  There is no neurologic deficit on exam to suggest a stroke as it seems to be isolated to his wrist and forearm.  Monitor for any signs of a DVT or cellulitis but at the present time there is none apparent on exam.

## 2020-09-01 NOTE — Telephone Encounter (Signed)
Advised ok to go to dentist.

## 2020-09-01 NOTE — Telephone Encounter (Signed)
Patient states he recently had a procedure to put his defibrillator in and he has a severe toothache on the left side. He would like to know if it is okay for him to see a dentist.

## 2020-09-02 ENCOUNTER — Ambulatory Visit: Payer: Medicare Other

## 2020-09-13 ENCOUNTER — Other Ambulatory Visit: Payer: Self-pay | Admitting: Cardiology

## 2020-09-21 ENCOUNTER — Ambulatory Visit (INDEPENDENT_AMBULATORY_CARE_PROVIDER_SITE_OTHER): Payer: Medicare Other | Admitting: Podiatry

## 2020-09-21 ENCOUNTER — Other Ambulatory Visit: Payer: Self-pay

## 2020-09-21 ENCOUNTER — Telehealth: Payer: Self-pay | Admitting: Emergency Medicine

## 2020-09-21 ENCOUNTER — Ambulatory Visit: Payer: Medicare Other | Admitting: Podiatry

## 2020-09-21 ENCOUNTER — Encounter: Payer: Self-pay | Admitting: Podiatry

## 2020-09-21 DIAGNOSIS — B351 Tinea unguium: Secondary | ICD-10-CM

## 2020-09-21 DIAGNOSIS — Z7901 Long term (current) use of anticoagulants: Secondary | ICD-10-CM

## 2020-09-21 DIAGNOSIS — M79676 Pain in unspecified toe(s): Secondary | ICD-10-CM

## 2020-09-21 DIAGNOSIS — E0843 Diabetes mellitus due to underlying condition with diabetic autonomic (poly)neuropathy: Secondary | ICD-10-CM

## 2020-09-21 NOTE — Progress Notes (Signed)
  Subjective:  Patient ID: John [name], male    DOB: 27-Jan-1951,  MRN: [id number]  Chief Complaint  Patient presents with  . Nail Problem    Nail trim     70 y.o. male presents with the above complaint. History confirmed with patient.  He missed his recent appointment Dr. Prudence Davidson and had to reschedule because he was recent hospitalized for pacemaker insertion.  Doing somewhat better.  They also have gotten quite thickened elongated  Objective:  Physical Exam: warm, good capillary refill, no trophic changes or ulcerative lesions and normal DP and PT pulses.  Thickened elongated nail plates with nail dystrophy, yellow discoloration and subungual debris x10 Assessment:   1. Pain due to onychomycosis of toenail   2. Diabetes mellitus due to underlying condition with diabetic autonomic neuropathy, unspecified whether long term insulin use ([place])   3. Encounter for current long-term use of anticoagulants      Plan:  Patient was evaluated and treated and all questions answered.  Discussed the etiology and treatment options for the condition in detail with the patient. Educated patient on the topical and oral treatment options for mycotic nails. Recommended debridement of the nails today. Sharp and mechanical debridement performed of all painful and mycotic nails today. Nails debrided in length and thickness using a nail nipper to level of comfort. Discussed treatment options including appropriate shoe gear. Follow up as needed for painful nails.    Return in about 3 months (around 12/21/2020) for at risk diabetic foot care.

## 2020-09-21 NOTE — Telephone Encounter (Signed)
   Patient's current transmission.

## 2020-09-21 NOTE — Telephone Encounter (Addendum)
Attempted to contact Mr.Marty about a Pensions consultant received. Patient states that he is dizzy and has a headache. Patient asked to send another transmission when he arrives home. Routing to DR. Camnitz to see any recommendations or restrictions advised at this time. Patient compliant with [place] Eliquis.    Alert note: CareAlert for AF > 6hrs/24hrs. Presenting rhythm AFL with V pacing, onset 09/20/20 @ 1513. Also, episode of VT term with ATP x1 on 09/19/20, rate avg 188bpm and what appears VT with retrograde conduction (see interval plot and episode #20 for best example (is intermittent) These episodes logged as "SVT". 9 VT-monitored episodes after AFlutter onset with rates 170-180's, see VEGM.

## 2020-09-22 NOTE — Telephone Encounter (Signed)
Per Dr. Curt Bears he would like patient to see APP this week. Patient called and apt. Made with Renee 09/23/20 @ 8:45 am. Location, date and time discussed with patient.

## 2020-09-22 NOTE — Telephone Encounter (Signed)
If appointment available needs to see EP app this week. No driving for 6 months due to ICD shock.

## 2020-09-22 NOTE — Telephone Encounter (Signed)
Successful follow up telephone encounter to patient to discuss ICD VT abortive therapy delivered 09/19/20 at approximately 1400. Patient states he has had a lack of stamina since moderate exertion during yard work 09/18/20 and complained of shortness of breath 4/2 and 4/3 with dizziness. Denies shortness of breath and dizziness during this encounter. Medications reviewed. Patient is compliant with all medications including Toprol XL, 100 mg in am and 50 mg in pm. Did admit to utilizing albuterol inhaler 09/19/20 afternoon, possibly prior to VT therapy. Driving restrictions and shock plan reviewed. Patient has appointment with Dr. Claiborne Billings 09/28/20. Will request patient appointment with EP APP ASAP per Dr. Curt Bears recommendation.

## 2020-09-22 NOTE — Progress Notes (Addendum)
Cardiology Office Note Date:  09/22/2020  Patient ID:  Sargent, Mankey August 31, 1950, MRN [id number] PCP:  [name], MD  Cardiologist:  Dr. Claiborne Billings Electrophysiologist: Dr. Curt Bears    Chief Complaint: VT  History of Present Illness: SONU KRUCKENBERG is a 70 y.o. male with history of CAD(multiple prior PCIs reported in the RCA and Cx, last stenting in RCA 2010), ICM, HTN, HLD, DM, OSA (w/CPAP), TAA, ILD, quit smoking 2019, AFlutter, back surgery resulting in L foot drop, chronic back pain/  Admitted to [facility] (via [facility]) 08/18/20 with CP, palpitations, weakness, and SOB, all progressive for a day of so increased palpitations, found in [facility] felt to be c/w VT , hypotensive and cardioverted in the ER. (1020J, 200J, 200J),given amio bolus and gtt as well as lidocaine bolus, note report that the amiodarone was stopped 2/2 bradycardia He underwent cath with no new or obstructive CAD Initially consulted by Dr. Quentin Ore, [facility] c/w VT and recommended ICD implant, (suspect to be scar mediated) Dr. Curt Bears implanted his device 08/20/20 Discharged 08/21/20  He saw Dr. Quentin Ore 08/31/20, was feeling well, had VT treated with ATP that did not immediately terminate the tachycardia Toprol was increased, 100mg  AM and 50mg  PM Thoughts towards AAD if more arrhythmias.  Device clinic noted alert for VT/ATP x2 on 09/19/20, in d/w Dr. Curt Bears recommended to be brought in to see APP.   TODAY He is doing OK, not driving is difficult. He denies CP, palpitations or cardiac awareness. Every once in a while has a fleeting feeling of needing to take a big breath and lightheaded, nothing that lasts more then a second. No near syncope or syncope. No symptoms like the day he went to the hospital No missed medicines No overt SOB, minimal DOE sometimes, walking in to the office he felt like he got a little winded unusually so, but settles quickly   Device information MDT dual chamber ICD implanted 08/20/20 Secondary  prevention w/hx of hemodynamically unstable MMVT  AAD hx 2018 Pulmonary function studies have shown restriction with low DLCO. He felt possibly also to have asthma based on nitric oxide testing. With his lung disease it has been recommended that amiodarone be considered for discontinuance Looks like amio was stopped in 2019 Follows with pulmonology   Past Medical History:  Diagnosis Date  . Atrial flutter ([place])    s/p cardioversion  . Coronary artery disease   . Diabetes mellitus   . GERD (gastroesophageal reflux disease)   . History of nuclear stress test 04/04/2011   lexiscan; mod-large in size fixed inferolateral defect (scar); non-diagnostic for ischemia; low risk scan   . Hyperlipidemia   . Hypertension   . Left foot drop    r/t past disk srugery - uses Kevlar brace  . Myocardial infarction ([place])    posterior MI  . Shortness of breath   . Sleep apnea    on CPAP; 04/28/2007 split-night - AHI during total sleep 44.43/hr and REM 72.56/hr    Past Surgical History:  Procedure Laterality Date  . BACK SURGERY  1985  . CARDIAC CATHETERIZATION  2010   6 stents total  . CARDIAC CATHETERIZATION  01/2000   percutaneous transluminal coronary balloon angioplasty of mid RCA stenotic lesion  . CARDIAC CATHETERIZATION  06/2006   no stenting; ischemic cardiomyopathy, EF 40-45%  . CARDIOVERSION N/A 07/28/2016   Procedure: CARDIOVERSION;  Surgeon: Troy Sine, MD;  Location: [facility];  Service: Cardiovascular;  Laterality: N/A;  . CORONARY ANGIOPLASTY  09/1998   mid-distal RCA balloon dilatation, 4.5 & 5.0 stents   . CORONARY ANGIOPLASTY WITH STENT PLACEMENT  03/1994   angioplasty & stenting (non-DES) of circumflex/prox ramus intermedius  . CORONARY ANGIOPLASTY WITH STENT PLACEMENT  10/1994   large iliac [id number] stent to RCA  . CORONARY ANGIOPLASTY WITH STENT PLACEMENT  12/2002   4.53mm stents x2 of RCA  . CORONARY ANGIOPLASTY WITH STENT PLACEMENT  01/2005   cutting balloon arthrectomy  of distal RCA & Cypher DES 3.5x13; cutting balloon arthrectomy of mid RCA with Cypher DES 3.5x18  . CORONARY ANGIOPLASTY WITH STENT PLACEMENT  11/2008   stenting of mid RCA with 4.0x76mm driver, non-DES  . ICD IMPLANT N/A 08/20/2020   Procedure: ICD IMPLANT;  Surgeon: Constance Haw, MD;  Location: [facility] [facility];  Service: Cardiovascular;  Laterality: N/A;  . INTRAVASCULAR PRESSURE WIRE/FFR STUDY N/A 03/02/2020   Procedure: INTRAVASCULAR PRESSURE WIRE/FFR STUDY;  Surgeon: Leonie Man, MD;  Location: [facility];  Service: Cardiovascular;  Laterality: N/A;  . LEFT HEART CATH AND CORONARY ANGIOGRAPHY N/A 03/02/2020   Procedure: LEFT HEART CATH AND CORONARY ANGIOGRAPHY;  Surgeon: Leonie Man, MD;  Location: [facility];  Service: Cardiovascular;  Laterality: N/A;  . LEFT HEART CATH AND CORONARY ANGIOGRAPHY N/A 08/19/2020   Procedure: LEFT HEART CATH AND CORONARY ANGIOGRAPHY;  Surgeon: Lorretta Harp, MD;  Location: [place] [place] [facility];  Service: Cardiovascular;  Laterality: N/A;  . LEFT HEART CATHETERIZATION WITH CORONARY ANGIOGRAM N/A 02/27/2012   Procedure: LEFT HEART CATHETERIZATION WITH CORONARY ANGIOGRAM;  Surgeon: Lorretta Harp, MD;  Location: [facility] C[facility];  Service: Cardiovascular;  Laterality: N/A;  . TRANSTHORACIC ECHOCARDIOGRAM  07/29/2010   EF 50=55%, mod inf wall hypokinesis & mild post wall hypokinesis; LA mild-mod dilated; mild mitral annular calcif & mild MR; mild TR & elevated RV systolic pressure; AV mildly sclerotic; mild aortic root dilatation     Current Outpatient Medications  Medication Sig Dispense Refill  . albuterol (VENTOLIN HFA) 108 (90 Base) MCG/ACT inhaler Inhale 2 puffs into the lungs every 6 (six) hours as needed for wheezing or shortness of breath. 1 each 6  . ELIQUIS 5 MG TABS tablet TAKE 1 TABLET BY MOUTH TWICE A DAY (Patient taking differently: Take 5 mg by mouth 2 (two) times daily.) 60 tablet 5  . Fluticasone Furoate (ARNUITY  ELLIPTA) 200 MCG/ACT AEPB Inhale 1 puff into the lungs daily. 1 each 3  . insulin aspart (NOVOLOG FLEXPEN) 100 UNIT/ML FlexPen INJECT 5-20 UNITS SUBCUTANEOUSLY WITH LUNCH AND DINNER (Patient taking differently: Inject 5-20 Units into the skin See admin instructions. LUNCH AND DINNER) 15 mL 1  . Insulin Pen Needle (NOVOFINE) 32G X 6 MM MISC 1 each by Other route daily. 100 each 3  . JARDIANCE 25 MG TABS tablet TAKE 1 TABLET BY MOUTH EVERY DAY (Patient taking differently: Take 25 mg by mouth daily.) 30 tablet 5  . LEVEMIR FLEXTOUCH 100 UNIT/ML FlexPen INJECT 46 UNITS INTO THE SKIN DAILY. DX:E11.9 15 mL 3  . metoprolol succinate (TOPROL-XL) 100 MG 24 hr tablet Take 1 tablet (100 mg total) by mouth daily. Take with or immediately following a meal. Take in the morning 90 tablet 2  . metoprolol succinate (TOPROL-XL) 50 MG 24 hr tablet Take 1 tablet (50 mg total) by mouth daily. Take with or immediately following a meal.  Take in the evening 90 tablet 1  . montelukast (SINGULAIR) 10 MG tablet TAKE 1 TABLET BY  MOUTH EVERY DAY 90 tablet 3  . nitroGLYCERIN (NITROSTAT) 0.4 MG SL tablet PLACE 1 TABLET (0.4 MG TOTAL) UNDER THE TONGUE EVERY 5 (FIVE) MINUTES AS NEEDED FOR CHEST PAIN. 25 tablet 1  . ONETOUCH ULTRA test strip USE TO CHECK BLOOD SUGAR 3 TIMES A DAY (E11.9) 100 strip 2  . oxyCODONE-acetaminophen (PERCOCET) 5-325 MG tablet Take 1 tablet by mouth every 4 (four) hours as needed for severe pain. 20 tablet 0  . rosuvastatin (CRESTOR) 20 MG tablet TAKE 1 TABLET BY MOUTH EVERY DAY (Patient taking differently: Take 20 mg by mouth at bedtime.) 90 tablet 3  . sacubitril-valsartan (ENTRESTO) 49-51 MG Take 1 tablet by mouth 2 (two) times daily.     No current facility-administered medications for this visit.    Allergies:   Benazepril, Fish allergy, and Omega-3 fatty acids   Social History:  The patient  reports that he quit smoking about 4 years ago. His smoking use included cigarettes. He has a 50.00  pack-year smoking history. He has never used smokeless tobacco. He reports previous alcohol use. He reports that he does not use drugs.   Family History:  The patient's family history includes Heart attack in his father.  ROS:  Please see the history of present illness.    All other systems are reviewed and otherwise negative.   PHYSICAL EXAM:  VS:  There were no vitals taken for this visit. BMI: There is no height or weight on file to calculate BMI. Well nourished, well developed, in no acute distress HEENT: normocephalic, atraumatic Neck: no JVD, carotid bruits or masses Cardiac:  RRR; no significant murmurs, no rubs, or gallops Lungs:  CTA b/l, no wheezing, rhonchi or rales Abd: soft, nontender MS: no deformity or atrophy Ext: no edema Skin: warm and dry, no rash Neuro:  No gross deficits appreciated Psych: euthymic mood, full affect  ICD site is well healed, stable, no tethering or discomfort   EKG:  No new EKGs  Device interrogation done today and reviewed by myself:  Battery and lead measurements are stable Acute implant lead outputs remain. He has had + treated VT (by ATP) Monitored VT AT/AF episodes and NSVT Treated episode + VT, intial ATP sent him into a 2nd morphology VT the 2ns spin of ATP broke the arrhythmia + monitored VT episodes just under 1st detection zone (175-179bpm) Some wobble just above/below VT one zone is 182 ALLhis episodes look to have been VT  some  Dual tachycardias Vt one zone    08/19/20: LHC  Prox RCA to Dist RCA lesion is 15% stenosed.  Ost RCA to Prox RCA lesion is 40% stenosed.  Ramus lesion is 60% stenosed.  Dist RCA lesion is 45% stenosed. Mr. Gruenwald has known disease and underwent diagnostic coronary angiography by Dr. Ellyn Hack in September 2021.  He had noncritical CAD with moderate proximal RCA and ramus branch stenosis both of which were shown to be not physiologically significant by DFR.  He presented with chest pain and  monomorphic VT at 220 bpm.  His enzymes rose mildly suspect related to demand ischemia.  His anatomy is unchanged from his previous cath.  His EKG on presentation suggested "scar VT".  And EP evaluation would be warranted.  Continue medical therapy is recommended.  The sheath was removed and a TR band was placed on the right wrist to achieve patent hemostasis.  The patient did receive his Eliquis last dose yesterday morning.  Heparin will be restarted 4 hours after sheath  removal without a bolus.  The patient left lab in stable condition. [name]. MD, [facility] 08/19/2020 9:04 AM  03/01/2020 TTE IMPRESSIONS 1. Left ventricular ejection fraction, by estimation, is 45 to 50%. The  left ventricle has mildly decreased function. The left ventricle  demonstrates global hypokinesis. Left ventricular diastolic parameters are  indeterminate.  2. Right ventricular systolic function is normal. The right ventricular  size is normal.  3. Left atrial size was severely dilated.  4. The mitral valve is normal in structure. Trivial mitral valve  regurgitation. No evidence of mitral stenosis.  5. The aortic valve is tricuspid. Aortic valve regurgitation is not  visualized. No aortic stenosis is present.  6. Aortic dilatation noted. There is mild dilatation of the aortic root,  measuring 41 mm.  7. The inferior vena cava is dilated in size with >50% respiratory  variability, suggesting right atrial pressure of 8 mmHg.    Recent Labs: 08/18/2020: ALT 26; Magnesium 2.1 08/19/2020: TSH 0.913 08/20/2020: BUN 21; Creatinine, Ser 1.17; Potassium 4.1; Sodium 141 08/21/2020: Hemoglobin 16.0; Platelets 116  02/28/2020: Cholesterol 115; HDL 35; LDL Cholesterol 61; Total CHOL/HDL Ratio 3.3; Triglycerides 93; VLDL 19   CrCl cannot be calculated (Patient's most recent lab result is older than the maximum 21 days allowed.).   Wt Readings from Last 3 Encounters:  09/01/20 219 lb (99.3 kg)  08/31/20 217 lb (98.4 kg)   08/18/20 222 lb (100.7 kg)     Other studies reviewed: Additional studies/records reviewed today include: summarized above  ASSESSMENT AND PLAN:  1. ICD     Intact function     No programming changes made  2. VT     He has 2 VTs, some EGMs dual tachycardias     Longest nearly 6 minutes  Reviewed with Dr. Curt Bears He has 2 VTs, one is nearly identical to his native EGM morphology. No changes to programming given no symptoms Increase his metoprolol to 100mg  BID Add mexiletine 150mg  BID  I am not certain that adjusting discriminators will be helpful He has one VT that is nearly identical to his native EGM He has dual tachycardia that was listed as an SVT  He will see Dr. Curt Bears in a couple weeks   3. AFlutter     CHA2DS2Vasc is 5, on Eliquis, appropriately dosed One labeled AF episode noted, 23 hours, avg HR 85bpm, appeared to have perhaps RVR vs VT as well within this event, though avg HR only 85  4. CAD     Cath as above     No anginal symptoms     On BB, statin, no ASA w/Eliquis  5. ICM 6. Chronic CHF (systolic)     No exam findings or symptoms of volume OL     On BB/Entresto    Disposition: F/u with Dr. Curt Bears in 2 weeks, sooner if needed.  Current medicines are reviewed at length with the patient today.  The patient did not have any concerns regarding medicines.  Venetia Night, PA-C 09/22/2020 6:42 PM     [address] [place] [address] [phone] (office)  [phone] (fax)

## 2020-09-23 ENCOUNTER — Ambulatory Visit (INDEPENDENT_AMBULATORY_CARE_PROVIDER_SITE_OTHER): Payer: Medicare Other | Admitting: Physician Assistant

## 2020-09-23 ENCOUNTER — Encounter: Payer: Self-pay | Admitting: Physician Assistant

## 2020-09-23 ENCOUNTER — Other Ambulatory Visit: Payer: Self-pay

## 2020-09-23 VITALS — BP 118/84 | HR 86 | Ht 72.0 in | Wt 219.0 lb

## 2020-09-23 DIAGNOSIS — I255 Ischemic cardiomyopathy: Secondary | ICD-10-CM | POA: Diagnosis not present

## 2020-09-23 DIAGNOSIS — I4892 Unspecified atrial flutter: Secondary | ICD-10-CM | POA: Diagnosis not present

## 2020-09-23 DIAGNOSIS — I2510 Atherosclerotic heart disease of native coronary artery without angina pectoris: Secondary | ICD-10-CM | POA: Diagnosis not present

## 2020-09-23 DIAGNOSIS — Z95810 Presence of automatic (implantable) cardiac defibrillator: Secondary | ICD-10-CM

## 2020-09-23 DIAGNOSIS — I5022 Chronic systolic (congestive) heart failure: Secondary | ICD-10-CM | POA: Diagnosis not present

## 2020-09-23 DIAGNOSIS — I214 Non-ST elevation (NSTEMI) myocardial infarction: Secondary | ICD-10-CM | POA: Diagnosis not present

## 2020-09-23 DIAGNOSIS — I472 Ventricular tachycardia, unspecified: Secondary | ICD-10-CM

## 2020-09-23 LAB — CUP PACEART INCLINIC DEVICE CHECK
Battery Remaining Longevity: 109 mo
Battery Voltage: 3.04 V
Brady Statistic AP VP Percent: 2.34 %
Brady Statistic AP VS Percent: 88.61 %
Brady Statistic AS VP Percent: 2.37 %
Brady Statistic AS VS Percent: 6.68 %
Brady Statistic RA Percent Paced: 80.72 %
Brady Statistic RV Percent Paced: 5.09 %
Date Time Interrogation Session: 20220407124533
HighPow Impedance: 66 Ohm
Implantable Lead Implant Date: 20220304
Implantable Lead Implant Date: 20220304
Implantable Lead Location: 753859
Implantable Lead Location: 753860
Implantable Lead Model: 5076
Implantable Pulse Generator Implant Date: 20220304
Lead Channel Impedance Value: 399 Ohm
Lead Channel Impedance Value: 456 Ohm
Lead Channel Impedance Value: 513 Ohm
Lead Channel Pacing Threshold Amplitude: 0.5 V
Lead Channel Pacing Threshold Amplitude: 0.5 V
Lead Channel Pacing Threshold Pulse Width: 0.4 ms
Lead Channel Pacing Threshold Pulse Width: 0.4 ms
Lead Channel Sensing Intrinsic Amplitude: 20.875 mV
Lead Channel Sensing Intrinsic Amplitude: 23.75 mV
Lead Channel Sensing Intrinsic Amplitude: 4 mV
Lead Channel Sensing Intrinsic Amplitude: 4.75 mV
Lead Channel Setting Pacing Amplitude: 3.5 V
Lead Channel Setting Pacing Amplitude: 3.5 V
Lead Channel Setting Pacing Pulse Width: 0.4 ms
Lead Channel Setting Sensing Sensitivity: 0.3 mV

## 2020-09-23 MED ORDER — METOPROLOL SUCCINATE ER 100 MG PO TB24: 100.0000 mg | ORAL_TABLET | Freq: Two times a day (BID) | ORAL | 1 refills | Status: DC

## 2020-09-23 MED ORDER — MEXILETINE HCL 150 MG PO CAPS: 150.0000 mg | ORAL_CAPSULE | Freq: Two times a day (BID) | ORAL | 1 refills | Status: DC

## 2020-09-23 NOTE — Patient Instructions (Signed)
Medication Instructions:   START TAKING  METOPROLOL 100 MG TWICE A  DAY   START [name]  150 MG TWICE A DAY   *If you need a refill on your cardiac medications before your next appointment, please call your pharmacy*   Lab [place]   If you have labs (blood work) drawn today and your tests are completely normal, you will receive your results only by: [name] MyChart Message (if you have MyChart) OR . A paper copy in the mail If you have any lab test that is abnormal or we need to change your treatment, we will call you to review the results.   Testing/Procedures: NONE ORDERED  TODAY\    Follow-Up: At [facility], you and your health needs are our priority.  As part of our continuing mission to provide you with exceptional heart care, we have created designated Provider Care Teams.  These Care Teams include your primary Cardiologist (physician) and Advanced Practice Providers (APPs -  Physician Assistants and Nurse Practitioners) who all work together to provide you with the care you need, when you need it.  We recommend signing up for the patient portal called "MyChart".  Sign up information is provided on this After Visit Summary.  MyChart is used to connect with patients for Virtual Visits (Telemedicine).  Patients are able to view lab/test results, encounter notes, upcoming appointments, etc.  Non-urgent messages can be sent to your provider as well.   To learn more about what you can do with MyChart, go to [email or web address].    Your next appointment:   2 week(s)  The format for your next appointment:   In Person  Provider:   Allegra Lai, MD ( [place])   Other Instructions

## 2020-09-28 ENCOUNTER — Other Ambulatory Visit: Payer: Self-pay

## 2020-09-28 ENCOUNTER — Encounter: Payer: Self-pay | Admitting: Cardiovascular Disease

## 2020-09-28 ENCOUNTER — Ambulatory Visit (INDEPENDENT_AMBULATORY_CARE_PROVIDER_SITE_OTHER): Payer: Medicare Other | Admitting: Cardiovascular Disease

## 2020-09-28 DIAGNOSIS — I472 Ventricular tachycardia, unspecified: Secondary | ICD-10-CM

## 2020-09-28 DIAGNOSIS — Z7901 Long term (current) use of anticoagulants: Secondary | ICD-10-CM | POA: Diagnosis not present

## 2020-09-28 DIAGNOSIS — I10 Essential (primary) hypertension: Secondary | ICD-10-CM

## 2020-09-28 DIAGNOSIS — E119 Type 2 diabetes mellitus without complications: Secondary | ICD-10-CM

## 2020-09-28 DIAGNOSIS — I255 Ischemic cardiomyopathy: Secondary | ICD-10-CM

## 2020-09-28 DIAGNOSIS — Z95810 Presence of automatic (implantable) cardiac defibrillator: Secondary | ICD-10-CM

## 2020-09-28 DIAGNOSIS — I2510 Atherosclerotic heart disease of native coronary artery without angina pectoris: Secondary | ICD-10-CM

## 2020-09-28 NOTE — Progress Notes (Signed)
Patient ID: JANSEN SCIUTO, male   DOB: [birth year], 70 y.o.   MRN: [id number]     HPI:  OSHEN WLODARCZYK is a 70 y.o. male who is a former patient of Dr. Rollene Fare. He presents for a 7 month cardiology followup evaluation.   Mr. Urbani has known CAD and underwent multiple percutaneous cardiac interventions with stenting to circumflex and RCA. His last cardiac catheterization was done by [name] in September 2013 showed his RCA stented patent but he had 40% narrowing in the very distal aspect. He has a history of hypertension, as well as long-standing tobacco use with mild polycythemia. I had seen him in a sleep clinic after he obtained a new CPAP machine.  He has severe sleep apnea with an AHI on his initial diagnostic study of 44 per hour overall and 72.6 per hour with REM sleep.  A download last year  showed excellent benefit from CPAP with an AHI of 3.1.  His initial stent to his RCA was in 1996 was a Cypher PS [id number] sent. In 1999 he underwent non-DES stenting to circumflex after he presented with a posterior MI. He had additional 4-5 stents placed in his RCA.  He has a history of prior back surgery and has had a left foot drop.  He has significant obstructive sleep apnea and continues to utilize CPAP therapy.  When I saw him in the past despite CPAP therapy he complained of fatigue and residual daytime sleepiness and I suggested the possibility of Nuvigil or Provigil if necessary.    Remotely he was seen  by [name] complaints of  atypical chest pain.  There was some concern that this may be reflux and pantoprazole was discontinued and replaced with dexilant. However, it was felt that the patient may require another stress test since his last evaluation was in 2012.  He also had had recent issues with some weight gain and shortness of breath and his intermittent Lasix dose changed to 40 mg daily.  On 05/06/2015 he underwent a nuclear stress test which was interpreted as a high risk  study due to an ejection fraction of 31%.  There was a large fixed inferior and inferoseptal wall defect suggestive of scar and evidence for inferoseptal akinesis.  There was no ischemia.   On his prior nuclear study the EF in October 2012 was 38%.  An echo Doppler study done the same day however, showed discordant data and his ejection fraction was 50-55%.  Hypokinesis of the inferior inferolateral wall was noted.  There was grade 1 diastolic dysfunction.  There was mild aortic root dilatation at 41 mm.  His left atrium was severely dilated.  His right atrium was severely dilated.  There was trivial TR.  Of note, his last echo Doppler study had shown an EF of 45-50%.  When I  saw him in November 2017  he stated that he has been using CPAP.  However, for the past month he has noticed significant increased fatigue.  He was also found to have elevated sugars and his insulin was adjusted.  He admits to development of some trace ankle swelling, left greater than right.  He denied any episodes of chest pain.  He was unaware of spells of tachycardia.  He denied presyncope or syncope.  During that evaluation, he was found to be in atrial flutter with variable block at 74 bpm of questionable duration.  At that time, I recommended that he discontinue Plavix and started him on eliquis  5 mg and further titrated his Bystolic to 10 mg.  His [id number] is at least 4 and with his CAD he was advised to continue aspirin 81 mg.  He underwent an echo Doppler study on 06/02/2016 which showed an EF of 40-45% with moderate diffuse hypokinesis.  While they are, mild aortic sclerosis and increased atrial septal thickness consistent with lipomatous hypertrophy.  I obtained a download of his CPAP unit from 02/09/2016 through 05/08/2016 and he was continuing to meet Medicare compliance with 91% of days of usage and 79% with usage greater than 4 hours.  He was averaging 5 hours and 55 minutes per night.  AHI was 3.7.  However, he had a  very large leak.  I have recommended that he get a new mask from his DME company.    He underwent successful cardioversion for atrial flutter.  He is unaware of any recurrent arrhythmia.  He continues to be on amiodarone, and Bystolic.  He is on eliquis for anticoagulation.  He has had difficulty with pain in the center of his back.  He has seen [name] and had undergone an MRI and was told of having disc disease.  He has noticed some shortness of breath.  He had undergone PFTs by [name] and was not found to have significant COPD or restrictive lung disease. [name]  He continues to be on amiodarone.  He has been on [id number] mg  And bystolic for hypertension and rosuvastatin 20 mg for hyperlipidemia.  Unfortunately he is still smoking at least a half a pack per day and is smoked for over 55 years.  Continues to use CPAP with 100% compliance.  He is diabetic on insulin.  He is diabetic on insulin.  He is retired as a Chartered certified accountant for [employer]. He had experienced some back discomfort.  He denies any classic exertional chest tightness.    An echo Doppler study on 04/26/2017 which showed an EF of 40-45% with grade 1 diastolic dysfunction.  There was borderline aortic root dilatation.  PA pressure was 31 mm.  He had mild LA and mild to moderate RA .  A follow-up nuclear perfusion study on 04/27/2017 continued to be low risk and showed findings consistent with prior inferolateral myocardial infarction.  The EF on the nuclear study was less than the echo at 34%.   He has had issues with shortness of breath and has been under evaluation by [name].  Fortunately, he finally quit tobacco in February 2019.  He is felt to have interstitial lung disease. Pulmonary function studies have shown restriction with low DLCO.  He felt possibly also to have asthma based on nitric oxide testing.  With his lung disease it has been recommended that amiodarone be considered for discontinuance.  He has had issues with  cough.  His ACE inhibitor was ultimately changed by [name] and he was started on low-dose Entresto at 24/26 mm twice a day in light of his reduce LV function.   When I saw him in June 2019 due to his lung disease, I recommended discontinuance of amiodarone.  I recommended that if his heart rate increased to greater than 60 bpm should increase Bystolic to 7.5 mg from his current dose at 5 mg.  I had a long discussion with him regarding Delene Loll importance of therapy and recommended further titration to 49/51 mg twice a day.  He continues to use CPAP therapy mid to 100% compliance.  He has felt well with Entresto.  He is He is seen  by Dr. Abigail Miyamoto for pulmonary follow-up.   When I saw him in August 2019 and discussed further titration of Entresto to 97/103 mg twice a day but he preferred to stay on the 49/5100 regimen.  His blood pressure was stable.  He continue to use CPAP with 100% compliance.   I saw him in December 2019.  At that time his shortness of breath was significantly improved which was contributed by his ultimate quitting of smoking in February 2019 as well as his discontinuance of amiodarone.  He denies PND orthopnea.  He denies recent wheezing.  However, his blood sugar has not been well controlled.  Recent hemoglobin A1c was 10.0.  He has been on insulin.  He continues to tolerate Entresto 49/51 mg twice a day in addition to Bystolic 5 mg.  He is on rosuvastatin 20 mg.  Laboratory has shown an LDL cholesterol excellent at 60.  He continues to be on Singulair and as needed albuterol.    On August 19, 2018 he underwent a follow-up echo Doppler study.  EF was now 50 to 55%.  There was moderate dilation of his left ventricle and evidence for mild inferior/inferolateral hypokinesis.  There was mild pulmonary hypertension.  He felt well   and denied chest pain, PND or orthopnea.   Since I saw him, he was evaluated by Loma Sousa, PA in August 2020 and continued to feel well without chest pain or  shortness of breath. A high resolution CT obtained in May 2020 revealed a new 1 cm pulmonary nodule in the left upper lobe and stable benign 6 mm pulmonary nodule in the right middle lobe.  He subsequently had follow-up with [name] September 2020 and in February 2021 was seen by Dr. Valeta Harms with stable respiratory symptoms.  I last saw him in April 2001 and since his prior evaluation he had felt weak and also noted some dizziness during a period of overexertion.. He has experienced exertional shortness of breath. He also admits to trace edema of his ankles. He denies chest pain PND orthopnea. He has been on Bystolic 5 mg daily, Entresto 49/51 mg twice daily, Jardiance 25 mg daily for his devious LV dysfunction. He denies recent wheezing.  He was hospitalized on September 11 through March 02, 2020 when he will was admitted with lightheadedness and low blood pressure.  He then developed left-sided chest pain with neck radiation, took nitroglycerin which further dropped his blood pressure improved his chest pain.  CTA of his chest showed aneurysmal dilatation of 4.1 cm of the ascending thoracic aorta.  His ECG showed new lateral precordial T wave inversion with his old inferior infarct.  He was mild troponin elevation with peak at 115.  He ultimately underwent repeat cardiac catheterization by Dr. Ellyn Hack and was found to have mild in-stent restenosis throughout the previously proximal to distal RCA stent with 50% lesions prior to and after the stents.  DFR was negative.  He had tandem 50 to 40% lesions in the ostial/proximal circumflex and DFR FFR were negative.  During his hospitalization his Entresto dose was reduced to 24/26.  I last saw him in September 2021 and since his hospitalization he has felt well.  He denies any further lightheadedness or episodes of chest pain.  He has continued to be on low-dose aspirin and Eliquis.  He is now on Entresto 24/26 twice daily, nebivolol 2.5 mg daily.  He is  on rosuvastatin 20 mg for hyperlipidemia.  He is on Jardiance and Levemir for his diabetes.    He was recently mated to [facility] on August 18, 2020 with chest pain, palpitations, weakness, and shortness of breath.  He was found to be in wide-complex tachycardia felt to be due to ventricular tachycardia, hypotensive and he was converted in the emergency room he was given amiodarone bolus and drip as well as lidocaine bolus.  Ultimately amiodarone was stopped secondary to bradycardia.  He underwent repeat cardiac catheterization on August 19, 2020 which did not reveal any significant progressive CAD with 60% ramus intermediate stenosis, 40% proximal RCA stenosis, patent RCA stent and 45% distal RCA narrowing after the stented segment.  He underwent ICD implantation on August 20, 2020.  Subsequently he has been on seen by Dr. Quentin Ore for follow-up EP office visit and most recently on September 23, 2020 by Tommye Standard, PA-C after he was found on September 19, 2020 to have VT/ATP x2 and was brought into the EP office for subsequent evaluation.  At that time, strips were reviewed with Dr. Curt Bears and his metoprolol was increased to 100 mg twice a day and mexiletine 150 mg twice a day was added to his medical regimen.  Felt that he had 2 types of ventricular tachycardia, 1 VT that is nearly identical to his native EGM and his other tachycardia had previously been listed as SVT.  Presently, he feels well.  He denies chest pain.  He is unaware of any recurrent palpitations since his medication adjustment.  No episodes of presyncope.  He denies shortness of breath.  He presents for reevaluation.  Past Medical History:  Diagnosis Date  . Atrial flutter ([place])    s/p cardioversion  . Coronary artery disease   . Diabetes mellitus   . GERD (gastroesophageal reflux disease)   . History of nuclear stress test 04/04/2011   lexiscan; mod-large in size fixed inferolateral defect (scar); non-diagnostic for ischemia; low risk scan    . Hyperlipidemia   . Hypertension   . Left foot drop    r/t past disk srugery - uses Kevlar brace  . Myocardial infarction ([place])    posterior MI  . Shortness of breath   . Sleep apnea    on CPAP; 04/28/2007 split-night - AHI during total sleep 44.43/hr and REM 72.56/hr    Past Surgical History:  Procedure Laterality Date  . BACK SURGERY  1985  . CARDIAC CATHETERIZATION  2010   6 stents total  . CARDIAC CATHETERIZATION  01/2000   percutaneous transluminal coronary balloon angioplasty of mid RCA stenotic lesion  . CARDIAC CATHETERIZATION  06/2006   no stenting; ischemic cardiomyopathy, EF 40-45%  . CARDIOVERSION N/A 07/28/2016   Procedure: CARDIOVERSION;  Surgeon: Troy Sine, MD;  Location: [facility];  Service: Cardiovascular;  Laterality: N/A;  . CORONARY ANGIOPLASTY  09/1998   mid-distal RCA balloon dilatation, 4.5 & 5.0 stents   . CORONARY ANGIOPLASTY WITH STENT PLACEMENT  03/1994   angioplasty & stenting (non-DES) of circumflex/prox ramus intermedius  . CORONARY ANGIOPLASTY WITH STENT PLACEMENT  10/1994   large iliac [id number] stent to RCA  . CORONARY ANGIOPLASTY WITH STENT PLACEMENT  12/2002   4.71m stents x2 of RCA  . CORONARY ANGIOPLASTY WITH STENT PLACEMENT  01/2005   cutting balloon arthrectomy of distal RCA & Cypher DES 3.5x13; cutting balloon arthrectomy of mid RCA with Cypher DES 3.5x18  . CORONARY ANGIOPLASTY WITH STENT PLACEMENT  11/2008   stenting of mid RCA with 4.0x152mdriver, non-DES  .  ICD IMPLANT N/A 08/20/2020   Procedure: ICD IMPLANT;  Surgeon: Constance Haw, MD;  Location: [facility];  Service: Cardiovascular;  Laterality: N/A;  . INTRAVASCULAR PRESSURE WIRE/FFR STUDY N/A 03/02/2020   Procedure: INTRAVASCULAR PRESSURE WIRE/FFR STUDY;  Surgeon: Leonie Man, MD;  Location: [facility];  Service: Cardiovascular;  Laterality: N/A;  . LEFT HEART CATH AND CORONARY ANGIOGRAPHY N/A 03/02/2020   Procedure: LEFT HEART CATH AND CORONARY  ANGIOGRAPHY;  Surgeon: Leonie Man, MD;  Location: [facility];  Service: Cardiovascular;  Laterality: N/A;  . LEFT HEART CATH AND CORONARY ANGIOGRAPHY N/A 08/19/2020   Procedure: LEFT HEART CATH AND CORONARY ANGIOGRAPHY;  Surgeon: Lorretta Harp, MD;  Location: [facility];  Service: Cardiovascular;  Laterality: N/A;  . LEFT HEART CATHETERIZATION WITH CORONARY ANGIOGRAM N/A 02/27/2012   Procedure: LEFT HEART CATHETERIZATION WITH CORONARY ANGIOGRAM;  Surgeon: Lorretta Harp, MD;  Location: [facility];  Service: Cardiovascular;  Laterality: N/A;  . TRANSTHORACIC ECHOCARDIOGRAM  07/29/2010   EF 50=55%, mod inf wall hypokinesis & mild post wall hypokinesis; LA mild-mod dilated; mild mitral annular calcif & mild MR; mild TR & elevated RV systolic pressure; AV mildly sclerotic; mild aortic root dilatation     Allergies  Allergen Reactions  . Benazepril Other (See Comments)    hyperkalemia  . Fish Allergy Itching  . Omega-3 Fatty Acids Hives and Itching    Current Outpatient Medications  Medication Sig Dispense Refill  . albuterol (VENTOLIN HFA) 108 (90 Base) MCG/ACT inhaler Inhale 2 puffs into the lungs every 6 (six) hours as needed for wheezing or shortness of breath. 1 each 6  . ELIQUIS 5 MG TABS tablet TAKE 1 TABLET BY MOUTH TWICE A DAY 60 tablet 5  . Fluticasone Furoate (ARNUITY ELLIPTA) 200 MCG/ACT AEPB Inhale 1 puff into the lungs daily. 1 each 3  . insulin aspart (NOVOLOG FLEXPEN) 100 UNIT/ML FlexPen INJECT 5-20 UNITS SUBCUTANEOUSLY WITH LUNCH AND DINNER 15 mL 1  . Insulin Pen Needle (NOVOFINE) 32G X 6 MM MISC 1 each by Other route daily. 100 each 3  . JARDIANCE 25 MG TABS tablet TAKE 1 TABLET BY MOUTH EVERY DAY 30 tablet 5  . LEVEMIR FLEXTOUCH 100 UNIT/ML FlexPen INJECT 46 UNITS INTO THE SKIN DAILY. DX:E11.9 15 mL 3  . metoprolol succinate (TOPROL-XL) 100 MG 24 hr tablet Take 1 tablet (100 mg total) by mouth in the morning and at bedtime. Take with or immediately  following a meal. Take in the morning 180 tablet 1  . mexiletine (MEXITIL) 150 MG capsule Take 1 capsule (150 mg total) by mouth 2 (two) times daily. 180 capsule 1  . montelukast (SINGULAIR) 10 MG tablet TAKE 1 TABLET BY MOUTH EVERY DAY 90 tablet 3  . nitroGLYCERIN (NITROSTAT) 0.4 MG SL tablet PLACE 1 TABLET (0.4 MG TOTAL) UNDER THE TONGUE EVERY 5 (FIVE) MINUTES AS NEEDED FOR CHEST PAIN. 25 tablet 1  . ONETOUCH ULTRA test strip USE TO CHECK BLOOD SUGAR 3 TIMES A DAY (E11.9) 100 strip 2  . rosuvastatin (CRESTOR) 20 MG tablet TAKE 1 TABLET BY MOUTH EVERY DAY 90 tablet 3  . sacubitril-valsartan (ENTRESTO) 49-51 MG Take 1 tablet by mouth 2 (two) times daily.     No current facility-administered medications for this visit.    Social History   Socioeconomic History  . Marital status: Married    Spouse name: Not on file  . Number of children: 3  . Years of  education: Not on file  . Highest education level: Not on file  Occupational History  . 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  Tobacco Use  . Smoking status: Former Smoker    Packs/day: 1.00    Years: 50.00    Pack years: 50.00    Types: Cigarettes    Quit date: 07/20/2016    Years since quitting: 4.2  . Smokeless tobacco: Never Used  Substance and Sexual Activity  . Alcohol use: Not Currently    Alcohol/week: 0.0 standard drinks  . Drug use: No  . Sexual activity: Yes  Other Topics Concern  . Not on file  Social History Narrative  . Not on file   Social Determinants of Health   Financial Resource Strain: Not on file  Food Insecurity: Not on file  Transportation Needs: Not on file  Physical Activity: Not on file  Stress: Not on file  Social Connections: Not on file  Intimate Partner Violence: Not on file   Social history is known that he previously worked as a Freight forwarder for [employer]. There is a long-standing tobacco history. He rarely drinks alcohol.  Family History   Problem Relation Age of Onset  . Heart attack Father    ROS General: Negative; No fevers, chills, or night sweats; Positive for fatigue  HEENT: Positive for loss of hearing in his right ear Pulmonary: Had evaluation for possible interstitial lung disease; this of breath improved. History of lung nodules being followed by [name] and Dr. Valeta Harms Cardiovascular: see history of present illness Positive for possible claudication GI: Negative; No nausea, vomiting, diarrhea, or abdominal pain GU: Negative; No dysuria, hematuria, or difficulty voiding Musculoskeletal: Negative; no myalgias, joint pain, or weakness Hematologic/Oncology: Negative; no easy bruising, bleeding Endocrine: Positive for diabetes mellitus Neuro: Negative; no changes in balance, headaches Skin: Negative; No rashes or skin lesions Psychiatric: Negative; No behavioral problems, depression Sleep: Positive for obstructive sleep apnea.  Mild residual daytime sleepiness; no bruxism, restless legs, hypnogognic hallucinations, no cataplexy Other comprehensive 14 point system review is negative.   PE BP 110/68 (BP Location: Left Arm, Patient Position: Sitting, Cuff Size: Normal)   Pulse 74   Ht 6' (1.829 m)   Wt 218 lb 3.2 oz (99 kg)   BMI 29.59 kg/m    Repeat blood pressure by me was 106/68.  Wt Readings from Last 3 Encounters:  09/28/20 218 lb 3.2 oz (99 kg)  09/23/20 219 lb (99.3 kg)  09/01/20 219 lb (99.3 kg)   General: Alert, oriented, no distress.  Skin: normal turgor, no rashes, warm and dry HEENT: Normocephalic, atraumatic. Pupils equal round and reactive to light; sclera anicteric; extraocular muscles intact;  Nose without nasal septal hypertrophy Mouth/Parynx benign; Mallinpatti scale 3 Neck: No JVD, no carotid bruits; normal carotid upstroke Lungs: clear to ausculatation and percussion; no wheezing or rales Chest wall: without tenderness to palpitation Heart: PMI not displaced, RRR, s1 s2 normal,  1/6 systolic murmur, no diastolic murmur, no rubs, gallops, thrills, or heaves Abdomen: soft, nontender; no hepatosplenomehaly, BS+; abdominal aorta nontender and not dilated by palpation. Back: no CVA tenderness Pulses 2+ Musculoskeletal: full range of motion, normal strength, no joint deformities Extremities: no clubbing cyanosis or edema, Homan's sign negative  Neurologic: grossly nonfocal; Cranial nerves grossly wnl Psychologic: Normal mood and affect  ECG (independently read by me): A paced at 74; PR 292 msec  September 24. 2021 ECG (independently read by  me): Sinus bradycardia 53 bpm.  Inferior Q waves.  Lateral ST-T changes.  QTc interval 412 ms  April 2021 ECG (independently read by me): Sinus bradycardia 55 bpm.  Inferior Q waves consistent with old inferior MI   August 29, 2018 ECG (independently read by me): Normal sinus rhythm at 63 bpm.  Old inferior infarct.  No ST segment changes.  Normal intervals.  December 2019 ECG (independently read by me): Sinus bradycardia  at 56; Old inferior MI  August 2019 ECG (independently read by me): Sinus bradycardia 50.  Old inferior infarct with Q waves fairly  June 2019 ECG (independently read by me): Sinus bradycardia at 47 bpm.  Inferior Q waves.  QTc interval 472 ms.  No ectopy.  December 2018 ECG (independently read by me): Sinus bradycardia 53 bpm.  Q waves inferiorly, QTc interval 463 ms.  November 2017 ECG (independently read by me): Atrial fibrillation at 57 bpm.  Inferior Q waves.  An small inferolateral Q waves.  QTc interval 418 ms.  06/05/2016 ECG (independently read by me): Atrial flutter with a rate of 79 bpm with variable block.  QTc interval 467 ms.  November 2017 ECG (independently read by me): Atrial flutter with variable block, ventricular rate at 74.  LVH by voltage criteria in aVL.  Inferior Q waves concordant with prior MI.  May 2017 ECG (independently read by me): Sinus bradycardia at 51 bpm.  Old inferior infarct  pattern with prominent inferior lateral Q waves  05/03/2015 ECG (independently read by me): Sinus bradycardia 54 bpm with PVC.  Inferior and lateral Q waves concordant with inferior lateral MI  ECG (independently read by me): Sinus bradycardia 55 bpm.  Inferior Q waves compatible with old inferior MI.  No significant ST segment changes.  January 2015 ECG (independently read by me): Normal sinus rhythm at 75 beats per minute. Old inferior infarction with inferior Q waves. No other ST-T changes  LABS:  BMP Latest Ref Rng & Units 08/20/2020 08/18/2020 02/28/2020  Glucose 70 - 99 mg/dL 166(H) 137(H) 168(H)  BUN 8 - 23 mg/dL 21 29(H) 23  Creatinine 0.61 - 1.24 mg/dL 1.17 1.30(H) 1.15  BUN/Creat Ratio 6 - 22 (calc) - - -  Sodium 135 - 145 mmol/L 141 139 143  Potassium 3.5 - 5.1 mmol/L 4.1 3.7 4.1  Chloride 98 - 111 mmol/L 112(H) 106 107  CO2 22 - 32 mmol/L '23 23 25  ' Calcium 8.9 - 10.3 mg/dL 8.4(L) 9.1 9.6   Hepatic Function Latest Ref Rng & Units 08/18/2020 02/28/2020 09/15/2019  Total Protein 6.5 - 8.1 g/dL 6.2(L) 6.8 5.8(L)  Albumin 3.5 - 5.0 g/dL 3.7 4.1 -  AST 15 - 41 U/L 28 32 14  ALT 0 - 44 U/L 26 38 20  Alk Phosphatase 38 - 126 U/L 62 67 -  Total Bilirubin 0.3 - 1.2 mg/dL 1.0 0.7 0.7  Bilirubin, Direct - - - -    CBC Latest Ref Rng & Units 08/21/2020 08/20/2020 08/19/2020  WBC 4.0 - 10.5 K/uL 6.6 6.8 6.3  Hemoglobin 13.0 - 17.0 g/dL 16.0 16.1 16.0  Hematocrit 39.0 - 52.0 % 50.1 51.4 50.3  Platelets 150 - 400 K/uL 116(L) 113(L) 124(L)   Lab Results  Component Value Date   MCV 93.5 08/21/2020   MCV 95.2 08/20/2020   MCV 94.4 08/19/2020   Lab Results  Component Value Date   TSH 0.913 08/19/2020    BNP    Component Value Date/Time   PROBNP 411 (H)  02/14/2018 1431    Lipid Panel     Component Value Date/Time   CHOL 115 02/28/2020 1910   CHOL 115 01/24/2019 0930   TRIG 93 02/28/2020 1910   HDL 35 (L) 02/28/2020 1910   HDL 33 (L) 01/24/2019 0930   CHOLHDL 3.3 02/28/2020 1910    VLDL 19 02/28/2020 1910   LDLCALC 61 02/28/2020 1910   LDLCALC 50 09/15/2019 1538     RADIOLOGY: No results found.  IMPRESSION:  1. Coronary artery disease involving native coronary artery of native heart without angina pectoris   2. Ischemic cardiomyopathy   3. Essential hypertension   4. VT (ventricular tachycardia) ([place])   5. ICD (implantable cardioverter-defibrillator) in place   6. Controlled type 2 diabetes mellitus without complication, without long-term current use of insulin ([place])   7. Anticoagulation adequate     ASSESSMENT AND PLAN: Mr. Swails is a 70 year-old Caucasian male who has CAD and has undergone prior extensive stenting to his RCA and also stenting to his circumflex vesse dating back to 24.  An echo Doppler study in 2013 showed ejection fraction of 45-50%. There was severe inferior, inferolateral and inferoseptal hypokinesis in the basal inferolateral wall segments consistent with scar. A nuclear perfusion study in November 2016 showed a large area of inferior scar without associated ischemia.  The ejection fraction on the nuclear study was reduced at 31%, making the scan, a high-risk study. His echo Doppler study done the same day showed discordant data with an EF of 50%, but also with inferior hypokinesis.  The echo Doppler study in November 2018 showed an EF of 40 to 45% with mild LVH, grade 1 diastolic dysfunction, and old inferior scar.  PA pressure was 31 mm.  Since his most recent evaluation with me, he had developed wide-complex tachycardia felt consistent with scar mediated ventricular tachycardia requiring cardioversion.  He underwent repeat cardiac catheterization which I reviewed with him today and did not show significant interval change from his prior studies.  There was 60% ramus immediate stenosis, a patent RCA stent with 40% narrowings proximal and distal to the stented segment.  He underwent successful ICD implantation by Dr. Curt Bears and subsequently  has been demonstrated device check to have had VT treated with ATP.  He has most recently been seen started on mexiletine and his metoprolol dose was titrated to 100 mg twice daily.  Recently he is without anginal symptomatology.  His blood pressure today is stable on metoprolol succinate 100 mg twice a day in addition to Entresto 49/51 mg twice a day.  He is anticoagulated on Eliquis 5 mg twice a day and is without bleeding.  He is diabetic on insulin.  He continues to be on rosuvastatin 20 mg for hyperlipidemia.  LDL cholesterol was 61.  He continues to be on his pulmonary medications with Arnuity Ellipta addition to albuterol.  I will see him in 6 months for reevaluation.  Troy Sine, MD, [facility]  10/04/2020 7:03 PM

## 2020-09-28 NOTE — Patient Instructions (Signed)
Medication Instructions:  No changes.   *If you need a refill on your cardiac medications before your next appointment, please call your pharmacy*   Lab Work: None ordered.   If you have labs (blood work) drawn today and your tests are completely normal, you will receive your results only by: [name] MyChart Message (if you have MyChart) OR . A paper copy in the mail If you have any lab test that is abnormal or we need to change your treatment, we will call you to review the results.   Testing/Procedures: None ordered  Follow-Up: At [facility], you and your health needs are our priority.  As part of our continuing mission to provide you with exceptional heart care, we have created designated Provider Care Teams.  These Care Teams include your primary Cardiologist (physician) and Advanced Practice Providers (APPs -  Physician Assistants and Nurse Practitioners) who all work together to provide you with the care you need, when you need it.  We recommend signing up for the patient portal called "MyChart".  Sign up information is provided on this After Visit Summary.  MyChart is used to connect with patients for Virtual Visits (Telemedicine).  Patients are able to view lab/test results, encounter notes, upcoming appointments, etc.  Non-urgent messages can be sent to your provider as well.   To learn more about what you can do with MyChart, go to [email or web address].    Your next appointment:   6 month(s)  The format for your next appointment:   In Person  Provider:   Shelva Majestic, MD

## 2020-10-04 ENCOUNTER — Encounter: Payer: Self-pay | Admitting: Cardiovascular Disease

## 2020-10-06 ENCOUNTER — Other Ambulatory Visit: Payer: Self-pay | Admitting: Cardiology

## 2020-10-08 ENCOUNTER — Telehealth: Payer: Self-pay | Admitting: Cardiovascular Disease

## 2020-10-08 ENCOUNTER — Other Ambulatory Visit: Payer: Self-pay | Admitting: Cardiology

## 2020-10-08 DIAGNOSIS — Z23 Encounter for immunization: Secondary | ICD-10-CM | POA: Diagnosis not present

## 2020-10-08 NOTE — Telephone Encounter (Signed)
*  STAT* If patient is at the pharmacy, call can be transferred to refill team.   1. Which medications need to be refilled? (please list name of each medication and dose if known) sacubitril-valsartan (ENTRESTO) 49-51 MG  2. Which pharmacy/location (including street and city if local pharmacy) is medication to be sent to? [id number] - [place], [place] [address]  3. Do they need a 30 day or 90 day supply? [place]

## 2020-10-11 ENCOUNTER — Other Ambulatory Visit: Payer: Self-pay

## 2020-10-11 ENCOUNTER — Encounter: Payer: Self-pay | Admitting: Cardiology

## 2020-10-11 ENCOUNTER — Ambulatory Visit (INDEPENDENT_AMBULATORY_CARE_PROVIDER_SITE_OTHER): Payer: Medicare Other | Admitting: Cardiology

## 2020-10-11 VITALS — BP 126/78 | HR 79 | Ht 72.0 in | Wt 216.0 lb

## 2020-10-11 DIAGNOSIS — I472 Ventricular tachycardia, unspecified: Secondary | ICD-10-CM

## 2020-10-11 DIAGNOSIS — I5022 Chronic systolic (congestive) heart failure: Secondary | ICD-10-CM

## 2020-10-11 DIAGNOSIS — I255 Ischemic cardiomyopathy: Secondary | ICD-10-CM | POA: Diagnosis not present

## 2020-10-11 MED ORDER — METOPROLOL SUCCINATE ER 200 MG PO TB24: 200.0000 mg | ORAL_TABLET | Freq: Every day | ORAL | 2 refills | Status: DC

## 2020-10-11 NOTE — Patient Instructions (Addendum)
Medication Instructions:  Your physician has recommended you make the following change in your medication: 1. CHANGE how you take your METOPROLOL -- start taking 200 mg ONCE daily at bedtime.  *If you need a refill on your cardiac medications before your next appointment, please call your pharmacy*   Lab Work: None ordered   Testing/Procedures: None ordered   Follow-Up: At [facility], you and your health needs are our priority.  As part of our continuing mission to provide you with exceptional heart care, we have created designated Provider Care Teams.  These Care Teams include your primary Cardiologist (physician) and Advanced Practice Providers (APPs -  Physician Assistants and Nurse Practitioners) who all work together to provide you with the care you need, when you need it.   Remote monitoring is used to monitor your Pacemaker or ICD from home. This monitoring reduces the number of office visits required to check your device to one time per year. It allows [place] to keep an eye on the functioning of your device to ensure it is working properly. You are scheduled for a device check from home on 11/19/2020. You may send your transmission at any time that day. If you have a wireless device, the transmission will be sent automatically. After your physician reviews your transmission, you will receive a postcard with your next transmission date.  Your next appointment:    11/26/2020 @ 2:15 pm  The format for your next appointment:   In Person  Provider:   Allegra Lai, MD   Thank you for choosing [facility]!!   Trinidad Curet, RN [phone]    Other Instructions  Call us in several weeks and let us know how you are doing with the medication timing change.

## 2020-10-11 NOTE — Progress Notes (Signed)
Electrophysiology Office Note   Date:  10/11/2020   ID:  John, [name] 1950-08-19, MRN [id number]  PCP:  [name], MD  Cardiologist:  Claiborne Billings Primary Electrophysiologist:  Kylor Valverde Meredith Leeds, MD    Chief Complaint: VT   History of Present Illness: John [name] is a 70 y.o. male who is being seen today for the evaluation of VT at the request of [name], MD. Presenting today for electrophysiology evaluation.  He has a history significant for coronary artery disease status post multiple PCI's to the RCA and circumflex, ischemic cardiomyopathy, hypertension, hyperlipidemia, diabetes, OSA on CPAP, ILD, atrial flutter, tobacco abuse quit in 2019.  He presented to the hospital 08/18/2020 with chest pain, palpitations, and weakness.  He was found to be in a wide-complex tachycardia consistent with VT.  He was cardioverted in the emergency room and given an amiodarone bolus as well as lidocaine.  The amiodarone was stopped secondary to bradycardia.  He is status post Med[employer] ICD implanted 08/20/2020.  He was seen in cardiology clinic 08/31/2020.  At that time he was found to have VT that was treated with ATP that did not immediately terminate tachycardia.  His metoprolol was increased.  He was noted by device clinic to have ATP x2 on 08/20/2020.  Today, he denies symptoms of palpitations, chest pain, shortness of breath, orthopnea, PND, lower extremity edema, claudication, dizziness, presyncope, syncope, bleeding, or neurologic sequela. The patient is tolerating medications without difficulties.  His main complaint today is fatigue.  He has been feeling somewhat fatigued over the last couple weeks.  He is unclear as to whether or not this could be due to his allergies or if it is due to medications.  He was started on mexiletine 2 weeks ago.  He is also on Toprol-XL 100 mg twice daily.  Aside from that, he has no major complaints.   Past Medical History:  Diagnosis Date  .  Atrial flutter ([place])    s/p cardioversion  . Coronary artery disease   . Diabetes mellitus   . GERD (gastroesophageal reflux disease)   . History of nuclear stress test 04/04/2011   lexiscan; mod-large in size fixed inferolateral defect (scar); non-diagnostic for ischemia; low risk scan   . Hyperlipidemia   . Hypertension   . Left foot drop    r/t past disk srugery - uses Kevlar brace  . Myocardial infarction ([place])    posterior MI  . Shortness of breath   . Sleep apnea    on CPAP; 04/28/2007 split-night - AHI during total sleep 44.43/hr and REM 72.56/hr   Past Surgical History:  Procedure Laterality Date  . BACK SURGERY  1985  . CARDIAC CATHETERIZATION  2010   6 stents total  . CARDIAC CATHETERIZATION  01/2000   percutaneous transluminal coronary balloon angioplasty of mid RCA stenotic lesion  . CARDIAC CATHETERIZATION  06/2006   no stenting; ischemic cardiomyopathy, EF 40-45%  . CARDIOVERSION N/A 07/28/2016   Procedure: CARDIOVERSION;  Surgeon: Troy Sine, MD;  Location: [facility];  Service: Cardiovascular;  Laterality: N/A;  . CORONARY ANGIOPLASTY  09/1998   mid-distal RCA balloon dilatation, 4.5 & 5.0 stents   . CORONARY ANGIOPLASTY WITH STENT PLACEMENT  03/1994   angioplasty & stenting (non-DES) of circumflex/prox ramus intermedius  . CORONARY ANGIOPLASTY WITH STENT PLACEMENT  10/1994   large iliac [id number] stent to RCA  . CORONARY ANGIOPLASTY WITH STENT PLACEMENT  12/2002   4.46mm stents x2 of RCA  .  CORONARY ANGIOPLASTY WITH STENT PLACEMENT  01/2005   cutting balloon arthrectomy of distal RCA & Cypher DES 3.5x13; cutting balloon arthrectomy of mid RCA with Cypher DES 3.5x18  . CORONARY ANGIOPLASTY WITH STENT PLACEMENT  11/2008   stenting of mid RCA with 4.0x21mm driver, non-DES  . ICD IMPLANT N/A 08/20/2020   Procedure: ICD IMPLANT;  Surgeon: Constance Haw, MD;  Location: [facility];  Service: Cardiovascular;  Laterality: N/A;  . INTRAVASCULAR PRESSURE WIRE/FFR  STUDY N/A 03/02/2020   Procedure: INTRAVASCULAR PRESSURE WIRE/FFR STUDY;  Surgeon: Leonie Man, MD;  Location: [facility];  Service: Cardiovascular;  Laterality: N/A;  . LEFT HEART CATH AND CORONARY ANGIOGRAPHY N/A 03/02/2020   Procedure: LEFT HEART CATH AND CORONARY ANGIOGRAPHY;  Surgeon: Leonie Man, MD;  Location: [facility];  Service: Cardiovascular;  Laterality: N/A;  . LEFT HEART CATH AND CORONARY ANGIOGRAPHY N/A 08/19/2020   Procedure: LEFT HEART CATH AND CORONARY ANGIOGRAPHY;  Surgeon: Lorretta Harp, MD;  Location: [facility];  Service: Cardiovascular;  Laterality: N/A;  . LEFT HEART CATHETERIZATION WITH CORONARY ANGIOGRAM N/A 02/27/2012   Procedure: LEFT HEART CATHETERIZATION WITH CORONARY ANGIOGRAM;  Surgeon: Lorretta Harp, MD;  Location: [facility];  Service: Cardiovascular;  Laterality: N/A;  . TRANSTHORACIC ECHOCARDIOGRAM  07/29/2010   EF 50=55%, mod inf wall hypokinesis & mild post wall hypokinesis; LA mild-mod dilated; mild mitral annular calcif & mild MR; mild TR & elevated RV systolic pressure; AV mildly sclerotic; mild aortic root dilatation      Current Outpatient Medications  Medication Sig Dispense Refill  . albuterol (VENTOLIN HFA) 108 (90 Base) MCG/ACT inhaler Inhale 2 puffs into the lungs every 6 (six) hours as needed for wheezing or shortness of breath. 1 each 6  . ELIQUIS 5 MG TABS tablet TAKE 1 TABLET BY MOUTH TWICE A DAY 60 tablet 5  . ENTRESTO 24-26 MG TAKE 1 TABLET BY MOUTH TWICE A DAY 60 tablet 6  . Fluticasone Furoate (ARNUITY ELLIPTA) 200 MCG/ACT AEPB Inhale 1 puff into the lungs daily. 1 each 3  . insulin aspart (NOVOLOG FLEXPEN) 100 UNIT/ML FlexPen INJECT 5-20 UNITS SUBCUTANEOUSLY WITH LUNCH AND DINNER 15 mL 1  . Insulin Pen Needle (NOVOFINE) 32G X 6 MM MISC 1 each by Other route daily. 100 each 3  . JARDIANCE 25 MG TABS tablet TAKE 1 TABLET BY MOUTH EVERY DAY 30 tablet 5  . LEVEMIR FLEXTOUCH 100 UNIT/ML FlexPen INJECT 46  UNITS INTO THE SKIN DAILY. DX:E11.9 15 mL 3  . metoprolol succinate (TOPROL-XL) 200 MG 24 hr tablet Take 1 tablet (200 mg total) by mouth daily. Take with or immediately following a meal. 90 tablet 2  . mexiletine (MEXITIL) 150 MG capsule Take 1 capsule (150 mg total) by mouth 2 (two) times daily. 180 capsule 1  . montelukast (SINGULAIR) 10 MG tablet TAKE 1 TABLET BY MOUTH EVERY DAY 90 tablet 3  . nitroGLYCERIN (NITROSTAT) 0.4 MG SL tablet PLACE 1 TABLET (0.4 MG TOTAL) UNDER THE TONGUE EVERY 5 (FIVE) MINUTES AS NEEDED FOR CHEST PAIN. 25 tablet 1  . ONETOUCH ULTRA test strip USE TO CHECK BLOOD SUGAR 3 TIMES A DAY (E11.9) 100 strip 2  . rosuvastatin (CRESTOR) 20 MG tablet TAKE 1 TABLET BY MOUTH EVERY DAY 90 tablet 3   No current facility-administered medications for this visit.    Allergies:   Benazepril, Fish allergy, and Omega-3 fatty acids   Social History:  The patient  reports that he quit smoking about 4 years ago. His smoking use included cigarettes. He has a 50.00 pack-year smoking history. He has never used smokeless tobacco. He reports previous alcohol use. He reports that he does not use drugs.   Family History:  The patient's family history includes Heart attack in his father.    ROS:  Please see the history of present illness.   Otherwise, review of systems is positive for none.   All other systems are reviewed and negative.    PHYSICAL EXAM: VS:  BP 126/78   Pulse 79   Ht 6' (1.829 m)   Wt 216 lb (98 kg)   BMI 29.29 kg/m  , BMI Body mass index is 29.29 kg/m. GEN: Well nourished, well developed, in no acute distress  HEENT: normal  Neck: no JVD, carotid bruits, or masses Cardiac: RRR; no murmurs, rubs, or gallops,no edema  Respiratory:  clear to auscultation bilaterally, normal work of breathing GI: soft, nontender, nondistended, + BS MS: no deformity or atrophy  Skin: warm and dry, device pocket is well healed Neuro:  Strength and sensation are intact Psych: euthymic  mood, full affect  EKG:  EKG is ordered today. Personal review of the ekg ordered shows atrial paced, PVCs, intermittent ventricular pacing  Device interrogation is reviewed today in detail.  See PaceArt for details.   Recent Labs: 08/18/2020: ALT 26; Magnesium 2.1 08/19/2020: TSH 0.913 08/20/2020: BUN 21; Creatinine, Ser 1.17; Potassium 4.1; Sodium 141 08/21/2020: Hemoglobin 16.0; Platelets 116    Lipid Panel     Component Value Date/Time   CHOL 115 02/28/2020 1910   CHOL 115 01/24/2019 0930   TRIG 93 02/28/2020 1910   HDL 35 (L) 02/28/2020 1910   HDL 33 (L) 01/24/2019 0930   CHOLHDL 3.3 02/28/2020 1910   VLDL 19 02/28/2020 1910   LDLCALC 61 02/28/2020 1910   LDLCALC 50 09/15/2019 1538     Wt Readings from Last 3 Encounters:  10/11/20 216 lb (98 kg)  09/28/20 218 lb 3.2 oz (99 kg)  09/23/20 219 lb (99.3 kg)      Other studies Reviewed: Additional studies/ records that were reviewed today include: LHC 08/19/20  Review of the above records today demonstrates:   Prox RCA to Dist RCA lesion is 15% stenosed.  Ost RCA to Prox RCA lesion is 40% stenosed.  [place] lesion is 60% stenosed.  Dist RCA lesion is 45% stenosed.  TTE 03/01/20 1. Left ventricular ejection fraction, by estimation, is 45 to 50%. The  left ventricle has mildly decreased function. The left ventricle  demonstrates global hypokinesis. Left ventricular diastolic parameters are  indeterminate.  2. Right ventricular systolic function is normal. The right ventricular  size is normal.  3. Left atrial size was severely dilated.  4. The mitral valve is normal in structure. Trivial mitral valve  regurgitation. No evidence of mitral stenosis.  5. The aortic valve is tricuspid. Aortic valve regurgitation is not  visualized. No aortic stenosis is present.  6. Aortic dilatation noted. There is mild dilatation of the aortic root,  measuring 41 mm.  7. The inferior vena cava is dilated in size with >50%  respiratory  variability, suggesting right atrial pressure of 8 mmHg.   ASSESSMENT AND PLAN:  1.  Ventricular tachycardia: Longest nearly 6 minutes.  Currently on metoprolol and mexiletine.  High risk medication monitoring.  His ventricular related burden is significantly reduced since starting mexiletine.  2.  Atrial flutter: Currently on Eliquis.  CHA2DS2-VASc  of 5.  3.  Coronary artery disease: No current chest pain.  Plan per primary cardiology.  4.  Chronic systolic heart failure secondary to ischemic cardiomyopathy: Ejection fraction mildly reduced.  Currently on beta-blocker and Entresto.  No obvious volume overload.  He is having some fatigue.  He is currently on Toprol-XL 100 mg twice daily.  We Yuki Purves consolidate this to 200 mg a day and have him take it at night.  If he continues to feel poorly, may need to decrease the dose.    Current medicines are reviewed at length with the patient today.   The patient does not have concerns regarding his medicines.  The following changes were made today: Change Toprol-XL to 200 mg daily  Labs/ tests ordered today include:  Orders Placed This Encounter  Procedures  . EKG 12-Lead     Disposition:   FU with Ramiya Delahunty 3 months  Signed, Darrel Baroni Meredith Leeds, MD  10/11/2020 11:01 AM     [facility] [address] [place] [address] [phone] (office) [phone] (fax)

## 2020-10-13 ENCOUNTER — Other Ambulatory Visit: Payer: Self-pay | Admitting: Cardiovascular Disease

## 2020-10-13 NOTE — Telephone Encounter (Signed)
30m, 98kg, scr 1.17 08/20/20, lovw/camnitz 10/11/20

## 2020-10-25 DIAGNOSIS — Z85820 Personal history of malignant melanoma of skin: Secondary | ICD-10-CM | POA: Diagnosis not present

## 2020-10-25 DIAGNOSIS — L905 Scar conditions and fibrosis of skin: Secondary | ICD-10-CM | POA: Diagnosis not present

## 2020-10-25 DIAGNOSIS — L814 Other melanin hyperpigmentation: Secondary | ICD-10-CM | POA: Diagnosis not present

## 2020-10-25 DIAGNOSIS — L821 Other seborrheic keratosis: Secondary | ICD-10-CM | POA: Diagnosis not present

## 2020-10-25 DIAGNOSIS — L570 Actinic keratosis: Secondary | ICD-10-CM | POA: Diagnosis not present

## 2020-10-25 DIAGNOSIS — D225 Melanocytic nevi of trunk: Secondary | ICD-10-CM | POA: Diagnosis not present

## 2020-10-25 DIAGNOSIS — Z85828 Personal history of other malignant neoplasm of skin: Secondary | ICD-10-CM | POA: Diagnosis not present

## 2020-10-28 DIAGNOSIS — H35373 Puckering of macula, bilateral: Secondary | ICD-10-CM | POA: Diagnosis not present

## 2020-10-28 LAB — HM DIABETES EYE EXAM

## 2020-11-09 ENCOUNTER — Other Ambulatory Visit: Payer: Self-pay | Admitting: Family Medicine

## 2020-11-10 ENCOUNTER — Encounter: Payer: Self-pay | Admitting: *Deleted

## 2020-11-19 ENCOUNTER — Ambulatory Visit (INDEPENDENT_AMBULATORY_CARE_PROVIDER_SITE_OTHER): Payer: Medicare Other

## 2020-11-19 DIAGNOSIS — I472 Ventricular tachycardia, unspecified: Secondary | ICD-10-CM

## 2020-11-19 LAB — CUP PACEART REMOTE DEVICE CHECK
Battery Remaining Longevity: 114 mo
Battery Voltage: 3.02 V
Brady Statistic AP VP Percent: 5.56 %
Brady Statistic AP VS Percent: 88.34 %
Brady Statistic AS VP Percent: 0.66 %
Brady Statistic AS VS Percent: 5.45 %
Brady Statistic RA Percent Paced: 90.52 %
Brady Statistic RV Percent Paced: 6.7 %
Date Time Interrogation Session: 20220603012403
HighPow Impedance: 76 Ohm
Implantable Lead Implant Date: 20220304
Implantable Lead Implant Date: 20220304
Implantable Lead Location: 753859
Implantable Lead Location: 753860
Implantable Lead Model: 5076
Implantable Pulse Generator Implant Date: 20220304
Lead Channel Impedance Value: 456 Ohm
Lead Channel Impedance Value: 513 Ohm
Lead Channel Impedance Value: 513 Ohm
Lead Channel Pacing Threshold Amplitude: 0.5 V
Lead Channel Pacing Threshold Amplitude: 0.625 V
Lead Channel Pacing Threshold Pulse Width: 0.4 ms
Lead Channel Pacing Threshold Pulse Width: 0.4 ms
Lead Channel Sensing Intrinsic Amplitude: 18.25 mV
Lead Channel Sensing Intrinsic Amplitude: 18.25 mV
Lead Channel Sensing Intrinsic Amplitude: 4.625 mV
Lead Channel Sensing Intrinsic Amplitude: 4.625 mV
Lead Channel Setting Pacing Amplitude: 1.5 V
Lead Channel Setting Pacing Amplitude: 2 V
Lead Channel Setting Pacing Pulse Width: 0.4 ms
Lead Channel Setting Sensing Sensitivity: 0.3 mV

## 2020-11-23 ENCOUNTER — Telehealth: Payer: Self-pay | Admitting: *Deleted

## 2020-11-23 NOTE — Telephone Encounter (Signed)
Informed patient of results and verbal understanding expressed. Before making any changes will further discuss increasing medication at [place] this Friday, 6/10. Pt reports that he had some questions/issues since starting [name] and Metoprolol and wanted to talk with MD further. Will await Friday's OV to determine increasing [name] vs another treatment plan. Pt agreeable to plan.

## 2020-11-23 NOTE — Telephone Encounter (Signed)
-----   Message from Will Meredith Leeds, MD sent at 11/19/2020  3:20 PM EDT ----- Abnormal device interrogation reviewed.  Lead parameters and battery status stable.  More episodes of VT. Increase mexiletine to 300 mg BID

## 2020-11-24 ENCOUNTER — Ambulatory Visit (INDEPENDENT_AMBULATORY_CARE_PROVIDER_SITE_OTHER): Payer: Medicare Other | Admitting: Podiatry

## 2020-11-24 ENCOUNTER — Ambulatory Visit (INDEPENDENT_AMBULATORY_CARE_PROVIDER_SITE_OTHER): Payer: Medicare Other

## 2020-11-24 ENCOUNTER — Other Ambulatory Visit: Payer: Self-pay

## 2020-11-24 DIAGNOSIS — T148XXA Other injury of unspecified body region, initial encounter: Secondary | ICD-10-CM | POA: Diagnosis not present

## 2020-11-24 DIAGNOSIS — E0843 Diabetes mellitus due to underlying condition with diabetic autonomic (poly)neuropathy: Secondary | ICD-10-CM | POA: Diagnosis not present

## 2020-11-24 DIAGNOSIS — L97522 Non-pressure chronic ulcer of other part of left foot with fat layer exposed: Secondary | ICD-10-CM | POA: Diagnosis not present

## 2020-11-24 DIAGNOSIS — M79672 Pain in left foot: Secondary | ICD-10-CM | POA: Diagnosis not present

## 2020-11-24 MED ORDER — DOXYCYCLINE HYCLATE 100 MG PO TABS: 100.0000 mg | ORAL_TABLET | Freq: Two times a day (BID) | ORAL | 0 refills | Status: DC

## 2020-11-24 MED ORDER — GENTAMICIN SULFATE 0.1 % EX CREA: 1.0000 "application " | TOPICAL_CREAM | Freq: Three times a day (TID) | CUTANEOUS | 0 refills | Status: DC

## 2020-11-26 ENCOUNTER — Other Ambulatory Visit: Payer: Self-pay

## 2020-11-26 ENCOUNTER — Encounter: Payer: Self-pay | Admitting: Cardiology

## 2020-11-26 ENCOUNTER — Ambulatory Visit (INDEPENDENT_AMBULATORY_CARE_PROVIDER_SITE_OTHER): Payer: Medicare Other | Admitting: Cardiology

## 2020-11-26 DIAGNOSIS — I255 Ischemic cardiomyopathy: Secondary | ICD-10-CM | POA: Diagnosis not present

## 2020-11-26 DIAGNOSIS — I472 Ventricular tachycardia, unspecified: Secondary | ICD-10-CM

## 2020-11-26 MED ORDER — SOTALOL HCL 80 MG PO TABS: 40.0000 mg | ORAL_TABLET | Freq: Two times a day (BID) | ORAL | 3 refills | Status: DC

## 2020-11-26 MED ORDER — METOPROLOL SUCCINATE ER 100 MG PO TB24: 100.0000 mg | ORAL_TABLET | Freq: Every day | ORAL | 1 refills | Status: DC

## 2020-11-26 NOTE — Progress Notes (Signed)
Electrophysiology Office Note   Date:  11/26/2020   ID:  John, [name] [birth year], MRN [id number]  PCP:  [name], MD  Cardiologist:  Claiborne Billings Primary Electrophysiologist:  [name], MD    Chief Complaint: VT   History of Present Illness: John [name] is a 70 y.o. male who is being seen today for the evaluation of VT at the request of [name], MD. Presenting today for electrophysiology evaluation.  He has a history significant for coronary artery disease status post multiple PCI's to the RCA and circumflex, ischemic cardiomyopathy, hypertension, hyperlipidemia, diabetes, OSA on CPAP, ILD, atrial flutter, tobacco abuse quit in 2019.  Presented to hospital 08/18/2020 with chest pain and palpitations.  He was found to be in a wide-complex tachycardia consistent with ventricular tachycardia.  He was cardioverted in the emergency room and started on amiodarone and lidocaine.  Amiodarone was stopped secondary to bradycardia.  He is status post Medtronic ICD implanted 08/20/2020.  He was seen in cardiology clinic 08/31/2020.  The time he was found to have VT treated with ATP.  His metoprolol was increased.  Today, denies symptoms of palpitations, chest pain, shortness of breath, orthopnea, PND, lower extremity edema, claudication, dizziness, presyncope, syncope, bleeding, or neurologic sequela. The patient is tolerating medications without difficulties.  Based on his device, he is continued to have episodes of ventricular tachycardia.  They are all slightly below his detection.  His mexiletine dose was increased, though it is not covered under his insurance.  He has been having some fatigue as well.  He is currently on Toprol-XL and is taking it at night.  It was recommended that his mexiletine dose be increased, though would be quite expensive for him as it is not covered on his insurance.  Past Medical History:  Diagnosis Date   Atrial flutter ([place])    s/p  cardioversion   Coronary artery disease    Diabetes mellitus    GERD (gastroesophageal reflux disease)    History of nuclear stress test 04/04/2011   lexiscan; mod-large in size fixed inferolateral defect (scar); non-diagnostic for ischemia; low risk scan    Hyperlipidemia    Hypertension    Left foot drop    r/t past disk srugery - uses Kevlar brace   Myocardial infarction ([place])    posterior MI   Shortness of breath    Sleep apnea    on CPAP; 04/28/2007 split-night - AHI during total sleep 44.43/hr and REM 72.56/hr   Past Surgical History:  Procedure Laterality Date   [place]  2010   6 stents total   CARDIAC CATHETERIZATION  01/2000   percutaneous transluminal coronary balloon angioplasty of mid RCA stenotic lesion   CARDIAC CATHETERIZATION  06/2006   no stenting; ischemic cardiomyopathy, EF 40-45%   CARDIOVERSION N/A 07/28/2016   Procedure: CARDIOVERSION;  Surgeon: Troy Sine, MD;  Location: [facility];  Service: Cardiovascular;  Laterality: N/A;   CORONARY ANGIOPLASTY  09/1998   mid-distal RCA balloon dilatation, 4.5 & 5.0 stents    CORONARY ANGIOPLASTY WITH STENT PLACEMENT  03/1994   angioplasty & stenting (non-DES) of circumflex/prox ramus intermedius   CORONARY ANGIOPLASTY WITH STENT PLACEMENT  10/1994   large iliac [id number] stent to RCA   [facility]  12/2002   4.22mm stents x2 of RCA   CORONARY ANGIOPLASTY WITH STENT PLACEMENT  01/2005   cutting balloon arthrectomy of distal RCA & Cypher  DES 3.5x13; cutting balloon arthrectomy of mid RCA with Cypher DES 3.5x18   CORONARY ANGIOPLASTY WITH STENT PLACEMENT  11/2008   stenting of mid RCA with 4.0x84mm driver, non-DES   ICD IMPLANT N/A 08/20/2020   Procedure: ICD IMPLANT;  Surgeon: Constance Haw, MD;  Location: [place];  Service: Cardiovascular;  Laterality: N/A;   INTRAVASCULAR PRESSURE WIRE/FFR STUDY N/A 03/02/2020   Procedure: INTRAVASCULAR  PRESSURE WIRE/FFR STUDY;  Surgeon: Leonie Man, MD;  Location: [place] [facility];  Service: Cardiovascular;  Laterality: N/A;   LEFT HEART CATH AND CORONARY ANGIOGRAPHY N/A 03/02/2020   Procedure: LEFT HEART CATH AND CORONARY ANGIOGRAPHY;  Surgeon: Leonie Man, MD;  Location: [place];  Service: Cardiovascular;  Laterality: N/A;   LEFT HEART CATH AND CORONARY ANGIOGRAPHY N/A 08/19/2020   Procedure: LEFT HEART CATH AND CORONARY ANGIOGRAPHY;  Surgeon: Lorretta Harp, MD;  Location: [facility] [facility];  Service: Cardiovascular;  Laterality: N/A;   LEFT HEART CATHETERIZATION WITH CORONARY ANGIOGRAM N/A 02/27/2012   Procedure: LEFT HEART CATHETERIZATION WITH CORONARY ANGIOGRAM;  Surgeon: Lorretta Harp, MD;  Location: [facility] C[facility];  Service: Cardiovascular;  Laterality: N/A;   TRANSTHORACIC ECHOCARDIOGRAM  07/29/2010   EF 50=55%, mod inf wall hypokinesis & mild post wall hypokinesis; LA mild-mod dilated; mild mitral annular calcif & mild MR; mild TR & elevated RV systolic pressure; AV mildly sclerotic; mild aortic root dilatation      Current Outpatient Medications  Medication Sig Dispense Refill   albuterol (VENTOLIN HFA) 108 (90 Base) MCG/ACT inhaler Inhale 2 puffs into the lungs every 6 (six) hours as needed for wheezing or shortness of breath. 1 each 6   doxycycline (VIBRA-TABS) 100 MG tablet Take 1 tablet (100 mg total) by mouth 2 (two) times daily. 20 tablet 0   ELIQUIS 5 MG TABS tablet TAKE 1 TABLET BY MOUTH TWICE A DAY 180 tablet 1   ENTRESTO 24-26 MG TAKE 1 TABLET BY MOUTH TWICE A DAY 60 tablet 6   Fluticasone Furoate (ARNUITY ELLIPTA) 200 MCG/ACT AEPB Inhale 1 puff into the lungs daily. 1 each 3   gentamicin cream (GARAMYCIN) 0.1 % Apply 1 application topically 3 (three) times daily. 15 g 0   insulin aspart (NOVOLOG FLEXPEN) 100 UNIT/ML FlexPen INJECT 5-20 UNITS SUBCUTANEOUSLY WITH LUNCH AND DINNER 15 mL 1   Insulin Pen Needle (NOVOFINE) 32G X 6 MM MISC 1 each by Other  route daily. 100 each 3   JARDIANCE 25 MG TABS tablet TAKE 1 TABLET BY MOUTH EVERY DAY 30 tablet 5   LEVEMIR FLEXTOUCH 100 UNIT/ML FlexPen INJECT 46 UNITS INTO THE SKIN DAILY. DX:E11.9 15 mL 3   metoprolol succinate (TOPROL-XL) 200 MG 24 hr tablet Take 1 tablet (200 mg total) by mouth daily. Take with or immediately following a meal. 90 tablet 2   mexiletine (MEXITIL) 150 MG capsule Take 1 capsule (150 mg total) by mouth 2 (two) times daily. 180 capsule 1   montelukast (SINGULAIR) 10 MG tablet TAKE 1 TABLET BY MOUTH EVERY DAY 90 tablet 3   nitroGLYCERIN (NITROSTAT) 0.4 MG SL tablet PLACE 1 TABLET (0.4 MG TOTAL) UNDER THE TONGUE EVERY 5 (FIVE) MINUTES AS NEEDED FOR CHEST PAIN. 25 tablet 1   ONETOUCH ULTRA test strip USE TO CHECK BLOOD SUGAR 3 TIMES A DAY (E11.9) 100 strip 2   rosuvastatin (CRESTOR) 20 MG tablet TAKE 1 TABLET BY MOUTH EVERY DAY 90 tablet 3   No current facility-administered medications for  this visit.    Allergies:   Benazepril, Fish allergy, and Omega-3 fatty acids   Social History:  The patient  reports that he quit smoking about 4 years ago. His smoking use included cigarettes. He has a 50.00 pack-year smoking history. He has never used smokeless tobacco. He reports previous alcohol use. He reports that he does not use drugs.   Family History:  The patient's family history includes Heart attack in his father.   ROS:  Please see the history of present illness.   Otherwise, review of systems is positive for none.   All other systems are reviewed and negative.   PHYSICAL EXAM: VS:  BP 112/80   Pulse 75   Ht 6' (1.829 m)   Wt 215 lb (97.5 kg)   SpO2 96%   BMI 29.16 kg/m  , BMI Body mass index is 29.16 kg/m. GEN: Well nourished, well developed, in no acute distress  HEENT: normal  Neck: no JVD, carotid bruits, or masses Cardiac: RRR; no murmurs, rubs, or gallops,no edema  Respiratory:  clear to auscultation bilaterally, normal work of breathing GI: soft, nontender,  nondistended, + BS MS: no deformity or atrophy  Skin: warm and dry, device site well healed Neuro:  Strength and sensation are intact Psych: euthymic mood, full affect  EKG:  EKG is ordered today. Personal review of the ekg ordered shows sinus rhythm, rate 75  Personal review of the device interrogation today. Results in [place]: 08/18/2020: ALT 26; Magnesium 2.1 08/19/2020: TSH 0.913 08/20/2020: BUN 21; Creatinine, Ser 1.17; Potassium 4.1; Sodium 141 08/21/2020: Hemoglobin 16.0; Platelets 116    Lipid Panel     Component Value Date/Time   CHOL 115 02/28/2020 1910   CHOL 115 01/24/2019 0930   TRIG 93 02/28/2020 1910   HDL 35 (L) 02/28/2020 1910   HDL 33 (L) 01/24/2019 0930   CHOLHDL 3.3 02/28/2020 1910   VLDL 19 02/28/2020 1910   LDLCALC 61 02/28/2020 1910   LDLCALC 50 09/15/2019 1538     Wt Readings from Last 3 Encounters:  11/26/20 215 lb (97.5 kg)  10/11/20 216 lb (98 kg)  09/28/20 218 lb 3.2 oz (99 kg)      Other studies Reviewed: Additional studies/ records that were reviewed today include: LHC 08/19/20  Review of the above records today demonstrates:  Prox RCA to Dist RCA lesion is 15% stenosed. Ost RCA to Prox RCA lesion is 40% stenosed. Ramus lesion is 60% stenosed. Dist RCA lesion is 45% stenosed.  TTE 03/01/20  1. Left ventricular ejection fraction, by estimation, is 45 to 50%. The  left ventricle has mildly decreased function. The left ventricle  demonstrates global hypokinesis. Left ventricular diastolic parameters are  indeterminate.   2. Right ventricular systolic function is normal. The right ventricular  size is normal.   3. Left atrial size was severely dilated.   4. The mitral valve is normal in structure. Trivial mitral valve  regurgitation. No evidence of mitral stenosis.   5. The aortic valve is tricuspid. Aortic valve regurgitation is not  visualized. No aortic stenosis is present.   6. Aortic dilatation noted. There is mild  dilatation of the aortic root,  measuring 41 mm.   7. The inferior vena cava is dilated in size with >50% respiratory  variability, suggesting right atrial pressure of 8 mmHg.   ASSESSMENT AND PLAN:  1.  Ventricular tachycardia: Longest nearly 6 minutes.  Currently on metoprolol and mexiletine.  High  risk medication monitoring.  He is status post Medtronic ICD implanted 08/20/2020.  Device functioning appropriately.  Unfortunately he is not able to increase his mexiletine as his insurance is not going to cover the medication.  Due to that, we Lucely Leard start him on sotalol 40 mg and titrate up.  We Zamarian Scarano load sotalol as an outpatient.  2.  Atrial flutter: Currently on Eliquis.  CHA2DS2-VASc 5.  3.  Coronary artery disease: No current chest pain.  Plan per primary cardiology.  4.  Chronic systolic heart failure due to ischemic cardiomyopathy: Ejection fraction mildly reduced.  Currently on beta-blocker and Entresto.  He is on a very high dose of metoprolol.  We Sybel Standish decrease the dose due to fatigue.   Current medicines are reviewed at length with the patient today.   The patient does not have concerns regarding his medicines.  The following changes were made today: Stop mexiletine, start sotalol, decrease Toprol-XL  Labs/ tests ordered today include:  Orders Placed This Encounter  Procedures   EKG 12-Lead      Disposition:   FU with Torry Adamczak 6 months  Signed, Itzel Lowrimore Meredith Leeds, MD  11/26/2020 2:50 PM     [address] [place] [address] [phone] (office) [phone] (fax)

## 2020-11-26 NOTE — Patient Instructions (Addendum)
Medication Instructions:  Your physician has recommended you make the following change in your medication:  STOP [name] DECREASE Metoprolol Succinate (Toprol) to 100 mg daily START Sotalol 40 mg twice daily  *If you need a refill on your cardiac medications before your next appointment, please call your pharmacy*   Lab Work: None ordered   Testing/Procedures: None ordered   Follow-Up: At [facility], you and your health needs are our priority.  As part of our continuing mission to provide you with exceptional heart care, we have created designated Provider Care Teams.  These Care Teams include your primary Cardiologist (physician) and Advanced Practice Providers (APPs -  Physician Assistants and Nurse Practitioners) who all work together to provide you with the care you need, when you need it.  Remote monitoring is used to monitor your Pacemaker or ICD from home. This monitoring reduces the number of office visits required to check your device to one time per year. It allows [place] to keep an eye on the functioning of your device to ensure it is working properly. You are scheduled for a device check from home on 02/18/2021. You may send your transmission at any time that day. If you have a wireless device, the transmission will be sent automatically. After your physician reviews your transmission, you will receive a postcard with your next transmission date.  Your physician recommends that you schedule a follow-up appointment next Thursday, 12/02/2020 @ 10:00 am  Your next appointment:   6 month(s)  The format for your next appointment:   In Person  Provider:      Thank you for choosing [facility]!!   Trinidad Curet, RN [phone]    Other Instructions

## 2020-11-30 NOTE — Progress Notes (Signed)
Subjective:  70 y.o. male with PMHx of diabetes mellitus presenting to the office today for new complaint regarding an open wound to the left second toe this been present for a few months now.  It was a gradual onset.  He experiences redness with swelling and drainage and it is painful.  He has not done anything for treatment.  He presents for further treatment evaluation   Past Medical History:  Diagnosis Date   Atrial flutter ([place])    s/p cardioversion   Coronary artery disease    Diabetes mellitus    GERD (gastroesophageal reflux disease)    History of nuclear stress test 04/04/2011   lexiscan; mod-large in size fixed inferolateral defect (scar); non-diagnostic for ischemia; low risk scan    Hyperlipidemia    Hypertension    Left foot drop    r/t past disk srugery - uses Kevlar brace   Myocardial infarction ([facility])    posterior MI   Shortness of breath    Sleep apnea    on CPAP; 04/28/2007 split-night - AHI during total sleep 44.43/hr and REM 72.56/hr         Objective/Physical Exam General: The patient is alert and oriented x3 in no acute distress.  Dermatology:  Wound #1 noted to the medial aspect of the left second toe measuring approximately 1.0 x 0.7 x 0.2 cm (LxWxD).   To the noted ulceration(s), there is no eschar. There is a moderate amount of slough, fibrin, and necrotic tissue noted. Granulation tissue and wound base is red. There is a minimal amount of serosanguineous drainage noted. There is no exposed bone muscle-tendon ligament or joint. There is no malodor. Periwound integrity is intact. Skin is warm, dry and supple bilateral lower extremities.  Vascular: Palpable pedal pulses bilaterally. No edema or erythema noted. Capillary refill within normal limits.  Neurological: Epicritic and protective threshold diminished bilaterally.   Musculoskeletal Exam: Range of motion within normal limits to all pedal and ankle joints bilateral. Muscle strength 5/5 in all  groups bilateral.   Radiographic exam: Normal osseous mineralization.  No osteolytic process noted around the second toe  Assessment: 1.  Ulcer left second toe secondary to diabetes mellitus 2. diabetes mellitus w/ peripheral neuropathy   Plan of Care:  1. Patient was evaluated. 2. medically necessary excisional debridement including subcutaneous tissue was performed using a tissue nipper and a chisel blade. Excisional debridement of all the necrotic nonviable tissue down to healthy bleeding viable tissue was performed with post-debridement measurements same as pre-. 3. the wound was cleansed and dry sterile dressing applied. 4.  Prescription for gentamicin cream to apply daily  5.  Prescription for doxycycline 100 mg 2 times daily  6.  The left hallux nail plate was debrided much shorter today.  It appears that the nail plate to the left hallux is intruding into the second toe causing the ulcer.   7.  Patient is to return to clinic in 3 weeks.   Edrick Kins, DPM [facility]  Dr. Edrick Kins, [place]                                        [place], [place] [address]                Office [phone]  Fax [phone]

## 2020-12-01 ENCOUNTER — Telehealth: Payer: Self-pay | Admitting: Cardiology

## 2020-12-01 ENCOUNTER — Emergency Department (HOSPITAL_COMMUNITY): Payer: Medicare Other

## 2020-12-01 ENCOUNTER — Emergency Department (HOSPITAL_COMMUNITY)
Admission: EM | Admit: 2020-12-01 | Discharge: 2020-12-01 | Disposition: A | Discharge status: Home / Self Care | Payer: Medicare Other | Attending: Emergency Medicine | Admitting: Emergency Medicine

## 2020-12-01 ENCOUNTER — Encounter (HOSPITAL_COMMUNITY): Payer: Self-pay | Admitting: Pharmacy Technician

## 2020-12-01 DIAGNOSIS — J449 Chronic obstructive pulmonary disease, unspecified: Secondary | ICD-10-CM | POA: Insufficient documentation

## 2020-12-01 DIAGNOSIS — Z79899 Other long term (current) drug therapy: Secondary | ICD-10-CM | POA: Insufficient documentation

## 2020-12-01 DIAGNOSIS — R079 Chest pain, unspecified: Secondary | ICD-10-CM | POA: Diagnosis not present

## 2020-12-01 DIAGNOSIS — Z794 Long term (current) use of insulin: Secondary | ICD-10-CM | POA: Diagnosis not present

## 2020-12-01 DIAGNOSIS — E119 Type 2 diabetes mellitus without complications: Secondary | ICD-10-CM | POA: Diagnosis not present

## 2020-12-01 DIAGNOSIS — I472 Ventricular tachycardia, unspecified: Secondary | ICD-10-CM

## 2020-12-01 DIAGNOSIS — Z87891 Personal history of nicotine dependence: Secondary | ICD-10-CM | POA: Insufficient documentation

## 2020-12-01 DIAGNOSIS — Z7951 Long term (current) use of inhaled steroids: Secondary | ICD-10-CM | POA: Insufficient documentation

## 2020-12-01 DIAGNOSIS — Z7901 Long term (current) use of anticoagulants: Secondary | ICD-10-CM | POA: Insufficient documentation

## 2020-12-01 DIAGNOSIS — R0602 Shortness of breath: Secondary | ICD-10-CM | POA: Diagnosis present

## 2020-12-01 DIAGNOSIS — Z20822 Contact with and (suspected) exposure to covid-19: Secondary | ICD-10-CM | POA: Diagnosis not present

## 2020-12-01 DIAGNOSIS — I10 Essential (primary) hypertension: Secondary | ICD-10-CM | POA: Diagnosis not present

## 2020-12-01 DIAGNOSIS — I517 Cardiomegaly: Secondary | ICD-10-CM | POA: Diagnosis not present

## 2020-12-01 DIAGNOSIS — I2510 Atherosclerotic heart disease of native coronary artery without angina pectoris: Secondary | ICD-10-CM | POA: Diagnosis not present

## 2020-12-01 LAB — CBC
HCT: 61 % — ABNORMAL HIGH (ref 39.0–52.0)
Hemoglobin: 19.2 g/dL — ABNORMAL HIGH (ref 13.0–17.0)
MCH: 29.5 pg (ref 26.0–34.0)
MCHC: 31.5 g/dL (ref 30.0–36.0)
MCV: 93.7 fL (ref 80.0–100.0)
Platelets: 198 10*3/uL (ref 150–400)
RBC: 6.51 MIL/uL — ABNORMAL HIGH (ref 4.22–5.81)
RDW: 14.2 % (ref 11.5–15.5)
WBC: 9.2 10*3/uL (ref 4.0–10.5)
nRBC: 0 % (ref 0.0–0.2)

## 2020-12-01 LAB — BASIC METABOLIC PANEL
Anion gap: 7 (ref 5–15)
BUN: 21 mg/dL (ref 8–23)
CO2: 26 mmol/L (ref 22–32)
Calcium: 10.1 mg/dL (ref 8.9–10.3)
Chloride: 109 mmol/L (ref 98–111)
Creatinine, Ser: 1.29 mg/dL — ABNORMAL HIGH (ref 0.61–1.24)
GFR, Estimated: 59 mL/min — ABNORMAL LOW (ref >60)
Glucose, Bld: 146 mg/dL — ABNORMAL HIGH (ref 70–99)
Potassium: 5.1 mmol/L (ref 3.5–5.1)
Sodium: 142 mmol/L (ref 135–145)

## 2020-12-01 LAB — TROPONIN I (HIGH SENSITIVITY): Troponin I (High Sensitivity): 31 ng/L — ABNORMAL HIGH (ref <18)

## 2020-12-01 LAB — RESP PANEL BY RT-PCR (FLU A&B, COVID) ARPGX2
Influenza A by PCR: NEGATIVE
Influenza B by PCR: NEGATIVE
SARS Coronavirus 2 by RT PCR: NEGATIVE

## 2020-12-01 MED ORDER — SODIUM CHLORIDE 0.9 % IV SOLN
INTRAVENOUS | Status: AC | PRN
  Administered 2020-12-01 (×2): 1000 mL via INTRAVENOUS

## 2020-12-01 MED ORDER — ETOMIDATE 2 MG/ML IV SOLN
INTRAVENOUS | Status: AC | PRN
  Administered 2020-12-01: 10 mg via INTRAVENOUS

## 2020-12-01 MED ORDER — PROPOFOL 1000 MG/100ML IV EMUL
INTRAVENOUS | Status: AC
  Filled 2020-12-01: qty 100

## 2020-12-01 MED ORDER — SOTALOL HCL 80 MG PO TABS: 80.0000 mg | ORAL_TABLET | Freq: Two times a day (BID) | ORAL | 3 refills | Status: DC

## 2020-12-01 MED ORDER — PROPOFOL 10 MG/ML IV BOLUS
INTRAVENOUS | Status: AC
  Filled 2020-12-01: qty 20

## 2020-12-01 NOTE — ED Provider Notes (Signed)
[facility] EMERGENCY DEPARTMENT Provider Note   CSN: [id number] Arrival date & time: 12/01/20  3716     History Chief Complaint  Patient presents with   Chest Pain    John [name] is a 70 y.o. male.  The history is provided by the patient and medical records. No language interpreter was used.  Chest Pain  70 year old male significant history of CAD with at least 11 stents, diabetes, atrial flutter GERD, COPD, OSA on CPAP who presents for evaluation of chest pain.  Patient reported he woke up at 2 AM this morning with complaints of chest pressure and having some shortness of breath.  He did reach out to his doctor who recommended to come to the ER.  When he arrived, he was found to be tachycardic and hypertensive.  He mentioned that chest pain has since resolved however upon movement he does endorse feeling lightheadedness and dizzy.  No diaphoresis no nausea.  He does take Eliquis daily and have not missed any dose.  He felt fine yesterday.  He does have a pacemaker/defibrillator by [employer] that was placed several weeks ago.  He has had cardioversion in the past.  Patient has been compliant with his medications.  Past Medical History:  Diagnosis Date   Atrial flutter ([place])    s/p cardioversion   Coronary artery disease    Diabetes mellitus    GERD (gastroesophageal reflux disease)    History of nuclear stress test 04/04/2011   lexiscan; mod-large in size fixed inferolateral defect (scar); non-diagnostic for ischemia; low risk scan    Hyperlipidemia    Hypertension    Left foot drop    r/t past disk srugery - uses Kevlar brace   Myocardial infarction ([place])    posterior MI   Shortness of breath    Sleep apnea    on CPAP; 04/28/2007 split-night - AHI during total sleep 44.43/hr and REM 72.56/hr    Patient Active Problem List   Diagnosis Date Noted   Non-ST elevation (NSTEMI) myocardial infarction [facility])    Ventricular tachycardia ([place]) 08/18/2020    Capsulitis 05/21/2020   Elevated troponin    Hyperglycemia due to diabetes mellitus ([place]) 02/29/2020   Thoracic aortic aneurysm ([place]) 02/29/2020   Hypotensive episode 02/29/2020   Obesity (BMI 30.0-34.9) 02/29/2020   Lung nodule 02/19/2020   Former smoker 02/19/2020   Healthcare maintenance 02/19/2020   Acoustic neuroma ([place]) 09/11/2017   Asymmetrical sensorineural hearing loss 09/11/2017   Encounter for cardioversion procedure    Anticoagulation adequate 06/06/2016   Fatigue 10/27/2015   Bradycardia 10/27/2015   Hyperlipidemia LDL goal <70 05/20/2015   OSA on CPAP 07/23/2013   Excessive daytime sleepiness 07/23/2013   Hypertension    Hyperlipidemia    Diabetes mellitus    Chest pain 08/18/2012   Tobacco abuse 08/18/2012   Unstable angina, cath [facility] 02/27/12 02/26/2012   CAD, multiple prior RCA PCI's. Last cath 2010, Myoview low risk Oct 2012 02/26/2012   DM type 2, goal HbA1c < 7% (HCC) 02/26/2012   HTN (hypertension) 02/26/2012   Dyslipidemia, low HDL. Intol to fish oil and Noacin 02/26/2012   Sleep apnea, on C-pap 02/26/2012   COPD (chronic obstructive pulmonary disease) ([place]) 02/26/2012   Smoking, quit one week ago 02/26/2012   GERD (gastroesophageal reflux disease) 02/26/2012    Past Surgical History:  Procedure Laterality Date   [place]  2010   6 stents total   CARDIAC CATHETERIZATION  01/2000   percutaneous transluminal coronary balloon angioplasty of mid RCA stenotic lesion   CARDIAC CATHETERIZATION  06/2006   no stenting; ischemic cardiomyopathy, EF 40-45%   CARDIOVERSION N/A 07/28/2016   Procedure: CARDIOVERSION;  Surgeon: Troy Sine, MD;  Location: [place];  Service: Cardiovascular;  Laterality: N/A;   CORONARY ANGIOPLASTY  09/1998   mid-distal RCA balloon dilatation, 4.5 & 5.0 stents    CORONARY ANGIOPLASTY WITH STENT PLACEMENT  03/1994   angioplasty & stenting (non-DES) of circumflex/prox ramus intermedius   CORONARY  ANGIOPLASTY WITH STENT PLACEMENT  10/1994   large iliac [id number] stent to RCA   [place]  12/2002   4.59mm stents x2 of RCA   CORONARY ANGIOPLASTY WITH STENT PLACEMENT  01/2005   cutting balloon arthrectomy of distal RCA & Cypher DES 3.5x13; cutting balloon arthrectomy of mid RCA with Cypher DES 3.5x18   CORONARY ANGIOPLASTY WITH STENT PLACEMENT  11/2008   stenting of mid RCA with 4.0x31mm driver, non-DES   ICD IMPLANT N/A 08/20/2020   Procedure: ICD IMPLANT;  Surgeon: Constance Haw, MD;  Location: [place] CV LAB;  Service: Cardiovascular;  Laterality: N/A;   INTRAVASCULAR PRESSURE WIRE/FFR STUDY N/A 03/02/2020   Procedure: INTRAVASCULAR PRESSURE WIRE/FFR STUDY;  Surgeon: Leonie Man, MD;  Location: [place] CV LAB;  Service: Cardiovascular;  Laterality: N/A;   LEFT HEART CATH AND CORONARY ANGIOGRAPHY N/A 03/02/2020   Procedure: LEFT HEART CATH AND CORONARY ANGIOGRAPHY;  Surgeon: Leonie Man, MD;  Location: [place] CV LAB;  Service: Cardiovascular;  Laterality: N/A;   LEFT HEART CATH AND CORONARY ANGIOGRAPHY N/A 08/19/2020   Procedure: LEFT HEART CATH AND CORONARY ANGIOGRAPHY;  Surgeon: Lorretta Harp, MD;  Location: [place] CV LAB;  Service: Cardiovascular;  Laterality: N/A;   LEFT HEART CATHETERIZATION WITH CORONARY ANGIOGRAM N/A 02/27/2012   Procedure: LEFT HEART CATHETERIZATION WITH CORONARY ANGIOGRAM;  Surgeon: Lorretta Harp, MD;  Location: [facility] CATH LAB;  Service: Cardiovascular;  Laterality: N/A;   TRANSTHORACIC ECHOCARDIOGRAM  07/29/2010   EF 50=55%, mod inf wall hypokinesis & mild post wall hypokinesis; LA mild-mod dilated; mild mitral annular calcif & mild MR; mild TR & elevated RV systolic pressure; AV mildly sclerotic; mild aortic root dilatation        Family History  Problem Relation Age of Onset   Heart attack Father     Social History   Tobacco Use   Smoking status: Former    Packs/day: 1.00    Years: 50.00     Pack years: 50.00    Types: Cigarettes    Quit date: 07/20/2016    Years since quitting: 4.3   Smokeless tobacco: Never  Substance Use Topics   Alcohol use: Not Currently    Alcohol/week: 0.0 standard drinks   Drug use: No    Home Medications Prior to Admission medications   Medication Sig Start Date End Date Taking? Authorizing Provider  albuterol (VENTOLIN HFA) 108 (90 Base) MCG/ACT inhaler Inhale 2 puffs into the lungs every 6 (six) hours as needed for wheezing or shortness of breath. 06/14/20   Icard, Octavio Graves, DO  doxycycline (VIBRA-TABS) 100 MG tablet Take 1 tablet (100 mg total) by mouth 2 (two) times daily. 11/24/20   Edrick Kins, DPM  ELIQUIS 5 MG TABS tablet TAKE 1 TABLET BY MOUTH TWICE A DAY 10/13/20   Camnitz, Ocie Doyne, MD  ENTRESTO 24-26 MG TAKE 1 TABLET BY MOUTH TWICE A DAY 10/08/20   Claiborne Billings,  Joyice Faster, MD  Fluticasone Furoate (ARNUITY ELLIPTA) 200 MCG/ACT AEPB Inhale 1 puff into the lungs daily. 07/21/20   Garner Nash, DO  gentamicin cream (GARAMYCIN) 0.1 % Apply 1 application topically 3 (three) times daily. 11/24/20   Edrick Kins, DPM  insulin aspart (NOVOLOG FLEXPEN) 100 UNIT/ML FlexPen INJECT 5-20 UNITS SUBCUTANEOUSLY WITH LUNCH AND DINNER 06/16/20   [name], MD  Insulin Pen Needle (NOVOFINE) 32G X 6 MM MISC 1 each by Other route daily. 08/19/19   [name], MD  JARDIANCE 25 MG TABS tablet TAKE 1 TABLET BY MOUTH EVERY DAY 11/09/20   [name], MD  LEVEMIR FLEXTOUCH 100 UNIT/ML FlexPen INJECT 46 UNITS INTO THE SKIN DAILY. DX:E11.9 08/25/20   [name], MD  metoprolol succinate (TOPROL-XL) 100 MG 24 hr tablet Take 1 tablet (100 mg total) by mouth daily. Take with or immediately following a meal. 11/26/20   Camnitz, Will Hassell Done, MD  montelukast (SINGULAIR) 10 MG tablet TAKE 1 TABLET BY MOUTH EVERY DAY 08/19/20   [name], MD  nitroGLYCERIN (NITROSTAT) 0.4 MG SL tablet PLACE 1 TABLET (0.4 MG TOTAL) UNDER THE TONGUE EVERY 5 (FIVE) MINUTES  AS NEEDED FOR CHEST PAIN. 07/27/20   Troy Sine, MD  [facility] ULTRA test strip USE TO CHECK BLOOD SUGAR 3 TIMES A DAY (E11.9) 07/16/20   [name], MD  rosuvastatin (CRESTOR) 20 MG tablet TAKE 1 TABLET BY MOUTH EVERY DAY 06/21/20   Troy Sine, MD  sotalol (BETAPACE) 80 MG tablet Take 0.5 tablets (40 mg total) by mouth 2 (two) times daily. 11/26/20   Camnitz, Ocie Doyne, MD    Allergies    Benazepril, Fish allergy, and Omega-3 fatty acids  Review of Systems   Review of Systems  Cardiovascular:  Positive for chest pain.  All other systems reviewed and are negative.  Physical Exam Updated Vital Signs BP (!) 87/74 (BP Location: Left Arm)   Pulse 72   Temp 97.8 F (36.6 C) (Oral)   Resp 18   SpO2 99%   Physical Exam Vitals and nursing note reviewed.  Constitutional:      General: He is not in acute distress.    Appearance: He is well-developed.     Comments: Hard of hearing in right ear.  At this time he appears to be in no acute discomfort  HENT:     Head: Atraumatic.  Eyes:     Conjunctiva/sclera: Conjunctivae normal.  Cardiovascular:     Rate and Rhythm: Tachycardia present.     Pulses: Normal pulses.     Heart sounds: Normal heart sounds.  Pulmonary:     Effort: Pulmonary effort is normal.     Breath sounds: Normal breath sounds.  Abdominal:     Palpations: Abdomen is soft.     Tenderness: There is no abdominal tenderness.  Musculoskeletal:     Cervical back: Neck supple.     Comments: Able to move all 4 extremities  Skin:    Capillary Refill: Capillary refill takes less than 2 seconds.     Findings: No rash.  Neurological:     Mental Status: He is alert and oriented to person, place, and time.  Psychiatric:        Mood and Affect: Mood normal.    ED Results / Procedures / Treatments   Labs (all labs ordered are listed, but only abnormal results are displayed) Labs Reviewed  RESP PANEL BY RT-PCR (FLU A&B, COVID)  ARPGX2  BASIC METABOLIC PANEL   CBC  TROPONIN I (HIGH SENSITIVITY)    EKG None  Radiology No results found.  Procedures .Cardioversion  Date/Time: 12/01/2020 11:13 AM Performed by: Domenic Moras, PA-C Authorized by: Domenic Moras, PA-C   Consent:    Consent obtained:  Written   Consent given by:  Patient   Risks discussed:  Cutaneous burn, death, induced arrhythmia and pain   Alternatives discussed:  Alternative treatment Pre-procedure details:    Cardioversion basis:  Emergent   Rhythm:  Atrial flutter   Electrode placement:  Anterior-posterior Patient sedated: Yes. Refer to sedation procedure documentation for details of sedation.  Attempt one:    Cardioversion mode:  Synchronous   Waveform:  Biphasic   Shock (Joules):  120   Shock outcome:  Conversion to normal sinus rhythm Post-procedure details:    Patient status:  Awake   Patient tolerance of procedure:  Tolerated well, no immediate complications .Critical Care  Date/Time: 12/01/2020 2:56 PM Performed by: Domenic Moras, PA-C Authorized by: Domenic Moras, PA-C   Critical care provider statement:    Critical care time (minutes):  33   Critical care was time spent personally by me on the following activities:  Discussions with consultants, evaluation of patient's response to treatment, examination of patient, ordering and performing treatments and interventions, ordering and review of laboratory studies, ordering and review of radiographic studies, pulse oximetry, re-evaluation of patient's condition, obtaining history from patient or surrogate and review of old charts   Medications Ordered in ED Medications - No data to display  ED Course  I have reviewed the triage vital signs and the nursing notes.  Pertinent labs & imaging results that were available during my care of the patient were reviewed by me and considered in my medical decision making (see chart for details).    MDM Rules/Calculators/A&P                          BP 112/87   Pulse  68   Temp 97.9 F (36.6 C) (Oral)   Resp 19   SpO2 96%   Final Clinical Impression(s) / ED Diagnoses Final diagnoses:  Ventricular tachycardia ([place])    Rx / DC Orders ED Discharge Orders          Ordered    sotalol (BETAPACE) 80 MG tablet  2 times daily        12/01/20 1445           11:08 AM Patient with history of atrial flutter requiring cardioversion in the past presenting with complaints of chest pain and lightheadedness and shortness of breath.  He was found to be hypotensive with initial blood pressure of 78 systolic and heart rate in the 140/150.  He was mentating appropriately.  Patient was promptly placed on pads, and subsequently successful cardioversion at 120 J x 1.  2:57 PM Labs mostly reassuring.  Mild bump in troponin likely stress related from his tachycardia.  Patient has been monitoring for the past 4 hours with good results.  [employer] rep did interrogate his device, and cardiology did see patient and recommend medication adjustment as instructed by their note, which includes increase sotalol dose to 80mg .   Patient is aware.  He is stable for discharge with outpatient follow-up.  Return precaution given.   Domenic Moras, PA-C 12/01/20 1503    Carmin Muskrat, MD 12/02/20 [phone]

## 2020-12-01 NOTE — Telephone Encounter (Signed)
Spoke with patient. Patient states that he is light headed, dizzy, has chest pains at times and is short of breath currently. Transmission received. Advised patient to go to the ED and have someone drive him or call [phone] and and have EMS transport him to the ED. Patient advised to Do Not Drive himself. Patient verbalized understanding and stated that he will be going to [facility] ED. Sending to Dr. Curt Bears to make aware.

## 2020-12-01 NOTE — ED Triage Notes (Signed)
Pt here with reports of sudden onset chest pain, shob, dizziness around 0200 today. Pt states during this time his HR was 140-160. Pt hypotensive and tachycardic in triage.

## 2020-12-01 NOTE — Consult Note (Addendum)
Cardiology Consultation:   Patient ID: John [name] MRN: [id number]; DOB: [birth year]  Admit date: 12/01/2020 Date of Consult: 12/01/2020  PCP:  [name], MD   [facility] Providers Cardiologist:  Shelva Majestic, MD  Electrophysiologist:  Constance Haw, MD       Patient Profile:   John [name] is a 70 y.o. male with a hx of CAD(multiple prior PCIs reported in the RCA and Cx, last stenting in RCA 2010), ICM, HTN, HLD, DM, OSA (w/CPAP), TAA, ILD, quit smoking 2019, AFlutter, back surgery resulting in L foot drop, chronic back pain who is being seen 12/01/2020 for the evaluation of VT at the request of Dr. Vanita Panda.  Device information MDT dual chamber ICD implanted 08/20/20 Secondary prevention w/hx of hemodynamically unstable MMVT   AAD hx 2018 Pulmonary function studies have shown restriction with low DLCO.  He felt possibly also to have asthma based on nitric oxide testing.  With his lung disease it has been recommended that amiodarone be considered for discontinuance Looks like amio was stopped in 2019 Follows with pulmonology March 2022 mexilletine > recurrent VT higher doses were too expensive June 2022 sotalol started   History of Present Illness:   John [name] past cardia/arrhythmia hx is noted for:  Admitted to [facility]via [facility]) 08/18/20 with CP, palpitations, weakness, and SOB, all progressive for a day of so increased palpitations, found in [facility] felt to be c/w VT , hypotensive and cardioverted in the ER. (1020J, 200J, 200J),given amio bolus and gtt as well as lidocaine bolus, note report that the amiodarone was stopped 2/2 bradycardia He underwent cath with no new or obstructive CAD Initially consulted by Dr. Quentin Ore, [facility] c/w VT and recommended ICD implant, (suspect to be scar mediated) Dr. Curt Bears implanted his device 08/20/20 Discharged 08/21/20   He saw Dr. Quentin Ore 08/31/20, was feeling well, had VT treated with ATP that did not immediately terminate the  tachycardia Toprol was increased, 100mg  AM and 50mg  PM Thoughts towards AAD if more arrhythmias.   Device clinic noted alert for VT/ATP x2 on 09/19/20, in d/w Dr. Curt Bears recommended to be brought in to see APP. I saw him 09/23/20 He had both monitored and treated VT episodes, all true VT/some dual tachycardias His BB further titrated upwards and mexiletine was added At his f/u with Dr. Curt Bears a few weeks later, his VT was not gone but burden much better and no changed were made. Noted that his VT morphology is very similar to his native EGM morphology   He was last seen 11/26/20 device check noted more VT again, all slight below detection rate in his treatment zone Noted then as well that a higher dose of mexiletine was cost prohibitive His mexiletine was stopped, started on Sotalol 40mg  BID and his toprol reduced (2/2 fatigue)  The patient called today  with some CP, SOB, dizziness and elevated HRs, verified compliance with his medicines, device transmission was sent and he was advised to go to the ER.  Here he was found in a WCT suspect to be slow VT at 147bpm and externally cardioverted  LABS K+ 5.2 BUN/Creat 21/1.29 HS Trop 31 WBC 9.2 H/H 19/61 Plts 198  He was woken  this morning with a slight aching in his chest, worse when laying on his back, he got up to go sit in his chair and checked his HR noting was persistently 140's-160's. When at rest he felt "pretty OK" but not perfect though when ambulating felt weak  and a bit lightheaded., once perhaps near syncope, but sat and felt better, he took his BP and it was 37'T systolic.  He did not have syncope  He feels very well currently and of late has been also  ICD check  Battery and lead measurements are OK 57 monitored VT episodes VT today 146-168bpm Monitor zone 150-182 VT 182-222 VF 222  Todays episode was declared by device as terminated with what looks like an ongoing VT though slower, a different morphology,  there is 1:1  though I think    Past Medical History:  Diagnosis Date   Atrial flutter ([place])    s/p cardioversion   Coronary artery disease    Diabetes mellitus    GERD (gastroesophageal reflux disease)    History of nuclear stress test 04/04/2011   lexiscan; mod-large in size fixed inferolateral defect (scar); non-diagnostic for ischemia; low risk scan    Hyperlipidemia    Hypertension    Left foot drop    r/t past disk srugery - uses Kevlar brace   Myocardial infarction ([place])    posterior MI   Shortness of breath    Sleep apnea    on CPAP; 04/28/2007 split-night - AHI during total sleep 44.43/hr and REM 72.56/hr    Past Surgical History:  Procedure Laterality Date   [place]  2010   6 stents total   CARDIAC CATHETERIZATION  01/2000   percutaneous transluminal coronary balloon angioplasty of mid RCA stenotic lesion   CARDIAC CATHETERIZATION  06/2006   no stenting; ischemic cardiomyopathy, EF 40-45%   CARDIOVERSION N/A 07/28/2016   Procedure: CARDIOVERSION;  Surgeon: Troy Sine, MD;  Location: [place];  Service: Cardiovascular;  Laterality: N/A;   CORONARY ANGIOPLASTY  09/1998   mid-distal RCA balloon dilatation, 4.5 & 5.0 stents    CORONARY ANGIOPLASTY WITH STENT PLACEMENT  03/1994   angioplasty & stenting (non-DES) of circumflex/prox ramus intermedius   CORONARY ANGIOPLASTY WITH STENT PLACEMENT  10/1994   large iliac [id number] stent to RCA   CORONARY ANGIOPLASTY WITH STENT PLACEMENT  12/2002   4.69mm stents x2 of RCA   CORONARY ANGIOPLASTY WITH STENT PLACEMENT  01/2005   cutting balloon arthrectomy of distal RCA & Cypher DES 3.5x13; cutting balloon arthrectomy of mid RCA with Cypher DES 3.5x18   CORONARY ANGIOPLASTY WITH STENT PLACEMENT  11/2008   stenting of mid RCA with 4.0x14mm driver, non-DES   ICD IMPLANT N/A 08/20/2020   Procedure: ICD IMPLANT;  Surgeon: Constance Haw, MD;  Location: [place] [facility];  Service: Cardiovascular;   Laterality: N/A;   INTRAVASCULAR PRESSURE WIRE/FFR STUDY N/A 03/02/2020   Procedure: INTRAVASCULAR PRESSURE WIRE/FFR STUDY;  Surgeon: Leonie Man, MD;  Location: [place] [facility];  Service: Cardiovascular;  Laterality: N/A;   LEFT HEART CATH AND CORONARY ANGIOGRAPHY N/A 03/02/2020   Procedure: LEFT HEART CATH AND CORONARY ANGIOGRAPHY;  Surgeon: Leonie Man, MD;  Location: [place] [facility];  Service: Cardiovascular;  Laterality: N/A;   LEFT HEART CATH AND CORONARY ANGIOGRAPHY N/A 08/19/2020   Procedure: LEFT HEART CATH AND CORONARY ANGIOGRAPHY;  Surgeon: Lorretta Harp, MD;  Location: [place] [facility];  Service: Cardiovascular;  Laterality: N/A;   LEFT HEART CATHETERIZATION WITH CORONARY ANGIOGRAM N/A 02/27/2012   Procedure: LEFT HEART CATHETERIZATION WITH CORONARY ANGIOGRAM;  Surgeon: Lorretta Harp, MD;  Location: [facility] CATH LAB;  Service: Cardiovascular;  Laterality: N/A;   TRANSTHORACIC ECHOCARDIOGRAM  07/29/2010   EF  50=55%, mod inf wall hypokinesis & mild post wall hypokinesis; LA mild-mod dilated; mild mitral annular calcif & mild MR; mild TR & elevated RV systolic pressure; AV mildly sclerotic; mild aortic root dilatation      Home Medications:  Prior to Admission medications   Medication Sig Start Date End Date Taking? Authorizing Provider  albuterol (VENTOLIN HFA) 108 (90 Base) MCG/ACT inhaler Inhale 2 puffs into the lungs every 6 (six) hours as needed for wheezing or shortness of breath. 06/14/20  Yes Icard, Bradley L, DO  ELIQUIS 5 MG TABS tablet TAKE 1 TABLET BY MOUTH TWICE A DAY Patient taking differently: Take 5 mg by mouth 2 (two) times daily. 10/13/20  Yes Edris Schneck Hassell Done, MD  ENTRESTO 24-26 MG TAKE 1 TABLET BY MOUTH TWICE A DAY Patient taking differently: Take 1 tablet by mouth 2 (two) times daily. 10/08/20  Yes Troy Sine, MD  Fluticasone Furoate (ARNUITY ELLIPTA) 200 MCG/ACT AEPB Inhale 1 puff into the lungs daily. 07/21/20  Yes Icard, Bradley L, DO  insulin  aspart (NOVOLOG FLEXPEN) 100 UNIT/ML FlexPen INJECT 5-20 UNITS SUBCUTANEOUSLY WITH LUNCH AND DINNER Patient taking differently: Inject 5-20 Units into the skin daily as needed for high blood sugar. Per sliding scale 06/16/20  Yes Pickard, [name], MD  JARDIANCE 25 MG TABS tablet TAKE 1 TABLET BY MOUTH EVERY DAY Patient taking differently: Take 25 mg by mouth daily. 11/09/20  Yes Pickard, [name], MD  LEVEMIR FLEXTOUCH 100 UNIT/ML FlexPen INJECT 46 UNITS INTO THE SKIN DAILY. DX:E11.9 Patient taking differently: Inject 46 Units into the skin daily. 08/25/20  Yes [name], MD  metoprolol succinate (TOPROL-XL) 100 MG 24 hr tablet Take 1 tablet (100 mg total) by mouth daily. Take with or immediately following a meal. 11/26/20  Yes Angla Delahunt Hassell Done, MD  montelukast (SINGULAIR) 10 MG tablet TAKE 1 TABLET BY MOUTH EVERY DAY Patient taking differently: Take 10 mg by mouth at bedtime. 08/19/20  Yes [name], MD  rosuvastatin (CRESTOR) 20 MG tablet TAKE 1 TABLET BY MOUTH EVERY DAY Patient taking differently: Take 20 mg by mouth every evening. 06/21/20  Yes Troy Sine, MD  sotalol (BETAPACE) 80 MG tablet Take 0.5 tablets (40 mg total) by mouth 2 (two) times daily. 11/26/20  Yes Joellen Tullos Hassell Done, MD  Insulin Pen Needle (NOVOFINE) 32G X 6 MM MISC 1 each by Other route daily. 08/19/19   [name], MD  nitroGLYCERIN (NITROSTAT) 0.4 MG SL tablet PLACE 1 TABLET (0.4 MG TOTAL) UNDER THE TONGUE EVERY 5 (FIVE) MINUTES AS NEEDED FOR CHEST PAIN. 07/27/20   Troy Sine, MD  [facility] ULTRA test strip USE TO CHECK BLOOD SUGAR 3 TIMES A DAY (E11.9) 07/16/20   [name], MD    Inpatient Medications: Scheduled Meds:  propofol       Continuous Infusions:  PRN Meds:   Allergies:    Allergies  Allergen Reactions   Benazepril Other (See Comments)    hyperkalemia   Fish Allergy Itching   Omega-3 Fatty Acids Hives and Itching    Social History:   Social History    Socioeconomic History   Marital status: Married    Spouse name: Not on file   Number of children: 3   Years of education: Not on file   Highest education level: Not on file  Occupational History   Occupation: Best boy: [employer]    Comment: [place], [place]. [place]  Tobacco  Use   Smoking status: Former    Packs/day: 1.00    Years: 50.00    Pack years: 50.00    Types: Cigarettes    Quit date: 07/20/2016    Years since quitting: 4.3   Smokeless tobacco: Never  Substance and Sexual Activity   Alcohol use: Not Currently    Alcohol/week: 0.0 standard drinks   Drug use: No   Sexual activity: Yes  Other Topics Concern   Not on file  Social History Narrative   Not on file   Social Determinants of Health   Financial Resource Strain: Not on file  Food Insecurity: Not on file  Transportation Needs: Not on file  Physical Activity: Not on file  Stress: Not on file  Social Connections: Not on file  Intimate Partner Violence: Not on file    Family History:   Family History  Problem Relation Age of Onset   Heart attack Father      ROS:  Please see the history of present illness.  All other ROS reviewed and negative.     Physical Exam/Data:   Vitals:   12/01/20 1115 12/01/20 1130 12/01/20 1145 12/01/20 1230  BP: 98/80 94/65 105/80 115/85  Pulse: 70 69 70 70  Resp: 13 20 12 16   Temp:      TempSrc:      SpO2: 98% 94% 97% 99%    Intake/Output Summary (Last 24 hours) at 12/01/2020 1302 Last data filed at 12/01/2020 1233 Gross per 24 hour  Intake 2000 ml  Output --  Net 2000 ml   Last 3 Weights 11/26/2020 10/11/2020 09/28/2020  Weight (lbs) 215 lb 216 lb 218 lb 3.2 oz  Weight (kg) 97.523 kg 97.977 kg 98.975 kg     There is no height or weight on file to calculate BMI.  General:  Well nourished, well developed, in no acute distress HEENT: normal Lymph: no adenopathy Neck: no JVD Endocrine:  No thryomegaly Vascular: No carotid bruits Cardiac:  RRR; no  murmurs, gallops or rubs Lungs:  CTA b/l, no wheezing, rhonchi or rales  Abd: soft, nontender  Ext: no edema Musculoskeletal:  No deformities Skin: warm and dry  Neuro:  no focal abnormalities noted Psych:  Normal affect   EKG:  The EKG was personally reviewed and demonstrates:    WCT 147bpm c/w VT AP/VS 70bpm  Telemetry:  Telemetry was personally reviewed and demonstrates:   VT  >> SR, rare pacing  Relevant CV Studies:   08/19/20: LHC Prox RCA to Dist RCA lesion is 15% stenosed. Ost RCA to Prox RCA lesion is 40% stenosed. Ramus lesion is 60% stenosed. Dist RCA lesion is 45% stenosed. John [name] has known disease and underwent diagnostic coronary angiography by Dr. Ellyn Hack in September 2021.  He had noncritical CAD with moderate proximal RCA and ramus branch stenosis both of which were shown to be not physiologically significant by DFR.  He presented with chest pain and monomorphic VT at 220 bpm.  His enzymes rose mildly suspect related to demand ischemia.  His anatomy is unchanged from his previous cath.  His EKG on presentation suggested "scar VT".  And EP evaluation would be warranted.  Continue medical therapy is recommended.  The sheath was removed and a TR band was placed on the right wrist to achieve patent hemostasis.  The patient did receive his Eliquis last dose yesterday morning.  Heparin Palmira Stickle be restarted 4 hours after sheath removal without a bolus.  The patient left lab in  stable condition. [name]. MD, [facility] 08/19/2020 9:04 AM   03/01/2020 TTE IMPRESSIONS  1. Left ventricular ejection fraction, by estimation, is 45 to 50%. The  left ventricle has mildly decreased function. The left ventricle  demonstrates global hypokinesis. Left ventricular diastolic parameters are  indeterminate.   2. Right ventricular systolic function is normal. The right ventricular  size is normal.   3. Left atrial size was severely dilated.   4. The mitral valve is normal in structure.  Trivial mitral valve  regurgitation. No evidence of mitral stenosis.   5. The aortic valve is tricuspid. Aortic valve regurgitation is not  visualized. No aortic stenosis is present.   6. Aortic dilatation noted. There is mild dilatation of the aortic root,  measuring 41 mm.   7. The inferior vena cava is dilated in size with >50% respiratory  variability, suggesting right atrial pressure of 8 mmHg. Laboratory Data:  High Sensitivity Troponin:   Recent Labs  Lab 12/01/20 1024  TROPONINIHS 31*     Chemistry Recent Labs  Lab 12/01/20 1024  NA 142  K 5.1  CL 109  CO2 26  GLUCOSE 146*  BUN 21  CREATININE 1.29*  CALCIUM 10.1  GFRNONAA 59*  ANIONGAP 7    No results for input(s): PROT, ALBUMIN, AST, ALT, ALKPHOS, BILITOT in the last 168 hours. Hematology Recent Labs  Lab 12/01/20 1024  WBC 9.2  RBC 6.51*  HGB 19.2*  HCT 61.0*  MCV 93.7  MCH 29.5  MCHC 31.5  RDW 14.2  PLT 198   BNPNo results for input(s): BNP, PROBNP in the last 168 hours.  DDimer No results for input(s): DDIMER in the last 168 hours.   Radiology/Studies:  DG Chest Port 1 View  Result Date: 12/01/2020 CLINICAL DATA:  Sudden onset of chest pain. EXAM: PORTABLE CHEST 1 VIEW COMPARISON:  August 21, 2020 FINDINGS: Dual lead cardiac pacemaker. Enlarged cardiac silhouette.  Mediastinal contours appear intact. There is no evidence of focal airspace consolidation, pleural effusion or pneumothorax. Possible pulmonary vascular congestion. Osseous structures are without acute abnormality. Soft tissues are grossly normal. IMPRESSION: 1. Enlarged cardiac silhouette. 2. Possible pulmonary vascular congestion. Electronically Signed   By: Fidela Salisbury M.D.   On: 12/01/2020 10:46     Assessment and Plan:   VT Known for him unfortunately as discussed above He does not need to be admitted, he did not have syncope I discussed with him, his no driving clock starts today again for 6 months Increase him sotalol  dose to 80mg  and see him back in 2 weeks in the office Follow up is in place  2. CAD No anginal sounding symptoms when not in VT.    Risk Assessment/Risk Scores:   For questions or updates, please contact [place] Please consult [email or web address] for contact info under    Signed, Baldwin Jamaica, PA-C  12/01/2020 1:02 PM  I have seen and examined this patient with Tommye Standard.  Agree with above, note added to reflect my findings.  On exam, RRR, no murmurs.  Patient was feeling well yesterday.  At 2:00 this morning, he awoke with dizziness.  He checked his heart rate and his heart rates were in the 160s.  This continued throughout the night until this morning.  He presented to the emergency room was found to be in slow ventricular tachycardia.  He converted without intervention.  He is currently being loaded on sotalol.  QTC is acceptable.  We Angee Gupton plan to increase sotalol  to 80 mg.  We Darcel Frane arrange for follow-up in EP clinic.  Hobie Kohles M. Myriah Boggus MD 12/01/2020 2:47 PM

## 2020-12-01 NOTE — Telephone Encounter (Signed)
STAT if HR is under 50 or over 120 (normal HR is 60-100 beats per minute)  What is your heart rate? 148 goes up to 160 if he moves  Do you have a log of your heart rate readings (document readings)?  Yesterday during the day it was in the 70's Woke up at 2 am uncomfortable and it was 148 hasn't came down since then   Do you have any other symptoms? SOB, cold sweats, and back pain between shoulder blades  Pt c/o Shortness Of Breath: STAT if SOB developed within the last 24 hours or pt is noticeably SOB on the phone  1. Are you currently SOB (can you hear that pt is SOB on the phone)? Not really, more so not a smooth breathe  2. How long have you been experiencing SOB? Started last night   3. Are you SOB when sitting or when up moving around? Sitting down and back in chair   4. Are you currently experiencing any other symptoms? See answer to previous telephone note question 3

## 2020-12-01 NOTE — Telephone Encounter (Addendum)
Reports heart being elevated this morning. HR 148 at 2:00 am with "slight" chest pain 2/10 that didn't last long. Reports not having SOB or dizziness  HR 148 & BP 98/72 now Denies active chest pain, dizziness or sob Medications reviewed and verified that he is taking metoprolol succinate 100 mg daily, sotalol 40 mg twice daily Advised that message would be sent to DOD Advised if symptoms get worse, to go to the ED for an evaluation Verbalized understanding.

## 2020-12-01 NOTE — Telephone Encounter (Signed)
I spoke with the patient and got him to send a transmission. While sending a transmission the patient stated he was light headed and dizzy. He did feel some SOB. He said when he sit back in his chair he been feeling some pain. It was not clear if it was chest pain.  He said is heart rate was 148 bpm. Transmission received. I let him speak with Janett Billow, rn.

## 2020-12-01 NOTE — Discharge Instructions (Addendum)
You have been seen and evaluated for your symptoms.  Please increase your sotalol as recommended.  Follow-up outpatient with cardiology for further care.  Return if you have any concern.

## 2020-12-01 NOTE — ED Notes (Signed)
Patient verbalizes understanding of discharge instructions. Opportunity for questioning and answers were provided. Armband removed by staff, pt discharged from ED via wheelchair to lobby to return home.

## 2020-12-01 NOTE — Sedation Documentation (Signed)
Shock delivered

## 2020-12-01 NOTE — ED Provider Notes (Signed)
Emergency Medicine Provider Triage Evaluation Note  John [name] , a 70 y.o. male  was evaluated in triage.  Pt complains of midsternal chest pain onset 2 AM today, awoke him from sleep, reports fast heart rate of 147-170 with associated shortness of breath.  Reports history of 11 stents.  Review of Systems  Positive: Chest pain, shortness of breath Negative: Vomiting, abdominal pain  Physical Exam  BP (!) 87/74 (BP Location: Left Arm)   Pulse 72   Temp 97.8 F (36.6 C) (Oral)   Resp 18   SpO2 99%  Gen:   Awake, no distress, appears uncomfortable   Resp:  Normal effort  MSK:   Moves extremities without difficulty  Other:  Tachycardic  Medical Decision Making  Medically screening exam initiated at 10:01 AM.  Appropriate orders placed.  John [name] was informed that the remainder of the evaluation will be completed by another provider, this initial triage assessment does not replace that evaluation, and the importance of remaining in the ED until their evaluation is complete.     Tacy Learn, PA-C 12/01/20 1002    Charlesetta Shanks, MD 12/06/20 [phone]

## 2020-12-02 ENCOUNTER — Ambulatory Visit: Payer: Medicare Other

## 2020-12-07 NOTE — Progress Notes (Signed)
Remote ICD transmission.   

## 2020-12-09 ENCOUNTER — Other Ambulatory Visit: Payer: Self-pay | Admitting: *Deleted

## 2020-12-09 MED ORDER — ONETOUCH ULTRA VI STRP: ORAL_STRIP | 2 refills | Status: DC

## 2020-12-10 ENCOUNTER — Ambulatory Visit (INDEPENDENT_AMBULATORY_CARE_PROVIDER_SITE_OTHER): Payer: Medicare Other | Admitting: Student

## 2020-12-10 ENCOUNTER — Telehealth: Payer: Self-pay

## 2020-12-10 ENCOUNTER — Other Ambulatory Visit: Payer: Self-pay

## 2020-12-10 ENCOUNTER — Encounter: Payer: Self-pay | Admitting: Student

## 2020-12-10 VITALS — BP 110/70 | HR 72 | Ht 72.0 in | Wt 218.2 lb

## 2020-12-10 DIAGNOSIS — I472 Ventricular tachycardia, unspecified: Secondary | ICD-10-CM

## 2020-12-10 DIAGNOSIS — E119 Type 2 diabetes mellitus without complications: Secondary | ICD-10-CM

## 2020-12-10 DIAGNOSIS — I4892 Unspecified atrial flutter: Secondary | ICD-10-CM

## 2020-12-10 DIAGNOSIS — I5022 Chronic systolic (congestive) heart failure: Secondary | ICD-10-CM

## 2020-12-10 DIAGNOSIS — I10 Essential (primary) hypertension: Secondary | ICD-10-CM

## 2020-12-10 DIAGNOSIS — I255 Ischemic cardiomyopathy: Secondary | ICD-10-CM | POA: Diagnosis not present

## 2020-12-10 LAB — CUP PACEART INCLINIC DEVICE CHECK
Battery Remaining Longevity: 114 mo
Battery Voltage: 3.03 V
Brady Statistic AP VP Percent: 5.97 %
Brady Statistic AP VS Percent: 88.3 %
Brady Statistic AS VP Percent: 0.35 %
Brady Statistic AS VS Percent: 5.38 %
Brady Statistic RA Percent Paced: 90.83 %
Brady Statistic RV Percent Paced: 6.83 %
Date Time Interrogation Session: 20220624124603
HighPow Impedance: 74 Ohm
Implantable Lead Implant Date: 20220304
Implantable Lead Implant Date: 20220304
Implantable Lead Location: 753859
Implantable Lead Location: 753860
Implantable Lead Model: 5076
Implantable Pulse Generator Implant Date: 20220304
Lead Channel Impedance Value: 456 Ohm
Lead Channel Impedance Value: 513 Ohm
Lead Channel Impedance Value: 532 Ohm
Lead Channel Pacing Threshold Amplitude: 0.625 V
Lead Channel Pacing Threshold Amplitude: 0.625 V
Lead Channel Pacing Threshold Pulse Width: 0.4 ms
Lead Channel Pacing Threshold Pulse Width: 0.4 ms
Lead Channel Sensing Intrinsic Amplitude: 25.125 mV
Lead Channel Sensing Intrinsic Amplitude: 27.5 mV
Lead Channel Sensing Intrinsic Amplitude: 4.25 mV
Lead Channel Sensing Intrinsic Amplitude: 4.5 mV
Lead Channel Setting Pacing Amplitude: 1.5 V
Lead Channel Setting Pacing Amplitude: 2 V
Lead Channel Setting Pacing Pulse Width: 0.4 ms
Lead Channel Setting Sensing Sensitivity: 0.3 mV

## 2020-12-10 MED ORDER — SOTALOL HCL 120 MG PO TABS: 120.0000 mg | ORAL_TABLET | Freq: Two times a day (BID) | ORAL | 6 refills | Status: DC

## 2020-12-10 NOTE — Telephone Encounter (Signed)
Spoke with the patient and scheduled him for EKG and lab work next week.

## 2020-12-10 NOTE — Patient Instructions (Signed)
Medication Instructions:  Your physician recommends that you continue on your current medications as directed. Please refer to the Current Medication list given to you today.  *If you need a refill on your cardiac medications before your next appointment, please call your pharmacy*   Lab Work: TODAY: CMET, CBC, Mg  If you have labs (blood work) drawn today and your tests are completely normal, you will receive your results only by: [place] (if you have MyChart) OR A paper copy in the mail If you have any lab test that is abnormal or we need to change your treatment, we will call you to review the results.   Follow-Up: At [facility], you and your health needs are our priority.  As part of our continuing mission to provide you with exceptional heart care, we have created designated Provider Care Teams.  These Care Teams include your primary Cardiologist (physician) and Advanced Practice Providers (APPs -  Physician Assistants and Nurse Practitioners) who all work together to provide you with the care you need, when you need it.   Your next appointment:   As scheduled

## 2020-12-10 NOTE — Telephone Encounter (Signed)
-----   Message from Shirley Friar, PA-C sent at 12/10/2020  2:35 PM EDT ----- Reviewed tachycardia with Dr. Curt Bears who recommended increasing his sotalol.   I spoke with the patient and sent the medication in!   Can we please bring him back for RN visit with EKG, BMET, and Mg at end of next week or early next? (7-10 days) to check QTc on Sotalol.  Thank you!

## 2020-12-10 NOTE — Progress Notes (Signed)
Electrophysiology Office Note Date: 12/10/2020  ID:  John, [name] 1951/02/18, MRN [id number]  PCP: [name], MD Primary Cardiologist: Shelva Majestic, MD Electrophysiologist: Constance Haw, MD   CC: Routine ICD follow-up  John [name] is a 70 y.o. male seen today for John Meredith Leeds, MD for post hospital follow up.  Since discharge from hospital the patient reports doing well overall. He does remember checking his HR on 6/20 and it being elevated consistent with device interrogation today, but otherwise doing well without issue on increased sotalol.  he denies chest pain, palpitations, dyspnea, PND, orthopnea, nausea, vomiting, dizziness, syncope, edema, weight gain, or early satiety. He has not had ICD shocks.   Device History: Medtronic Dual Chamber ICD implanted 08/20/2020 for ICM and VT History of AAD therapy: Yes; currently on sotalol. Mexitel stopped due to ineffectiveness.    Past Medical History:  Diagnosis Date   Atrial flutter ([place])    s/p cardioversion   Coronary artery disease    Diabetes mellitus    GERD (gastroesophageal reflux disease)    History of nuclear stress test 04/04/2011   lexiscan; mod-large in size fixed inferolateral defect (scar); non-diagnostic for ischemia; low risk scan    Hyperlipidemia    Hypertension    Left foot drop    r/t past disk srugery - uses Kevlar brace   Myocardial infarction ([place])    posterior MI   Shortness of breath    Sleep apnea    on CPAP; 04/28/2007 split-night - AHI during total sleep 44.43/hr and REM 72.56/hr   Past Surgical History:  Procedure Laterality Date   [place]  2010   6 stents total   CARDIAC CATHETERIZATION  01/2000   percutaneous transluminal coronary balloon angioplasty of mid RCA stenotic lesion   CARDIAC CATHETERIZATION  06/2006   no stenting; ischemic cardiomyopathy, EF 40-45%   CARDIOVERSION N/A 07/28/2016   Procedure: CARDIOVERSION;   Surgeon: Troy Sine, MD;  Location: [place] [facility];  Service: Cardiovascular;  Laterality: N/A;   CORONARY ANGIOPLASTY  09/1998   mid-distal RCA balloon dilatation, 4.5 & 5.0 stents    CORONARY ANGIOPLASTY WITH STENT PLACEMENT  03/1994   angioplasty & stenting (non-DES) of circumflex/prox ramus intermedius   CORONARY ANGIOPLASTY WITH STENT PLACEMENT  10/1994   large iliac [id number] stent to RCA   [place]  12/2002   4.31mm stents x2 of RCA   CORONARY ANGIOPLASTY WITH STENT PLACEMENT  01/2005   cutting balloon arthrectomy of distal RCA & Cypher DES 3.5x13; cutting balloon arthrectomy of mid RCA with Cypher DES 3.5x18   CORONARY ANGIOPLASTY WITH STENT PLACEMENT  11/2008   stenting of mid RCA with 4.0x34mm driver, non-DES   ICD IMPLANT N/A 08/20/2020   Procedure: ICD IMPLANT;  Surgeon: Constance Haw, MD;  Location: [facility];  Service: Cardiovascular;  Laterality: N/A;   INTRAVASCULAR PRESSURE WIRE/FFR STUDY N/A 03/02/2020   Procedure: INTRAVASCULAR PRESSURE WIRE/FFR STUDY;  Surgeon: Leonie Man, MD;  Location: [facility];  Service: Cardiovascular;  Laterality: N/A;   LEFT HEART CATH AND CORONARY ANGIOGRAPHY N/A 03/02/2020   Procedure: LEFT HEART CATH AND CORONARY ANGIOGRAPHY;  Surgeon: Leonie Man, MD;  Location: [facility];  Service: Cardiovascular;  Laterality: N/A;   LEFT HEART CATH AND CORONARY ANGIOGRAPHY N/A 08/19/2020   Procedure: LEFT HEART CATH AND CORONARY ANGIOGRAPHY;  Surgeon: Lorretta Harp, MD;  Location:  [facility];  Service: Cardiovascular;  Laterality: N/A;   LEFT HEART CATHETERIZATION WITH CORONARY ANGIOGRAM N/A 02/27/2012   Procedure: LEFT HEART CATHETERIZATION WITH CORONARY ANGIOGRAM;  Surgeon: Lorretta Harp, MD;  Location: [facility];  Service: Cardiovascular;  Laterality: N/A;   TRANSTHORACIC ECHOCARDIOGRAM  07/29/2010   EF 50=55%, mod inf wall hypokinesis & mild post wall hypokinesis; LA mild-mod  dilated; mild mitral annular calcif & mild MR; mild TR & elevated RV systolic pressure; AV mildly sclerotic; mild aortic root dilatation     Current Outpatient Medications  Medication Sig Dispense Refill   albuterol (VENTOLIN HFA) 108 (90 Base) MCG/ACT inhaler Inhale 2 puffs into the lungs every 6 (six) hours as needed for wheezing or shortness of breath. 1 each 6   ELIQUIS 5 MG TABS tablet TAKE 1 TABLET BY MOUTH TWICE A DAY 180 tablet 1   ENTRESTO 24-26 MG TAKE 1 TABLET BY MOUTH TWICE A DAY 60 tablet 6   Fluticasone Furoate (ARNUITY ELLIPTA) 200 MCG/ACT AEPB Inhale 1 puff into the lungs daily. 1 each 3   glucose blood (ONETOUCH ULTRA) test strip USE TO CHECK BLOOD SUGAR 3 TIMES A DAY (E11.9) 100 strip 2   insulin aspart (NOVOLOG FLEXPEN) 100 UNIT/ML FlexPen INJECT 5-20 UNITS SUBCUTANEOUSLY WITH LUNCH AND DINNER 15 mL 1   Insulin Pen Needle (NOVOFINE) 32G X 6 MM MISC 1 each by Other route daily. 100 each 3   JARDIANCE 25 MG TABS tablet TAKE 1 TABLET BY MOUTH EVERY DAY 30 tablet 5   LEVEMIR FLEXTOUCH 100 UNIT/ML FlexPen INJECT 46 UNITS INTO THE SKIN DAILY. DX:E11.9 15 mL 3   metoprolol succinate (TOPROL-XL) 100 MG 24 hr tablet Take 1 tablet (100 mg total) by mouth daily. Take with or immediately following a meal. 90 tablet 1   montelukast (SINGULAIR) 10 MG tablet TAKE 1 TABLET BY MOUTH EVERY DAY 90 tablet 3   nitroGLYCERIN (NITROSTAT) 0.4 MG SL tablet PLACE 1 TABLET (0.4 MG TOTAL) UNDER THE TONGUE EVERY 5 (FIVE) MINUTES AS NEEDED FOR CHEST PAIN. 25 tablet 1   rosuvastatin (CRESTOR) 20 MG tablet TAKE 1 TABLET BY MOUTH EVERY DAY 90 tablet 3   sotalol (BETAPACE) 80 MG tablet Take 1 tablet (80 mg total) by mouth 2 (two) times daily. 30 tablet 3   No current facility-administered medications for this visit.    Allergies:   Benazepril, Fish allergy, and Omega-3 fatty acids   Social History: Social History   Socioeconomic History   Marital status: Married    Spouse name: Not on file   Number  of children: 3   Years of education: Not on file   Highest education level: Not on file  Occupational History   Occupation: Best boy: [employer]    Comment: [name], [place]. [employer]  Tobacco Use   Smoking status: Former    Packs/day: 1.00    Years: 50.00    Pack years: 50.00    Types: Cigarettes    Quit date: 07/20/2016    Years since quitting: 4.3   Smokeless tobacco: Never  Substance and Sexual Activity   Alcohol use: Not Currently    Alcohol/week: 0.0 standard drinks   Drug use: No   Sexual activity: Yes  Other Topics Concern   Not on file  Social History Narrative   Not on file   Social Determinants of Health   Financial Resource Strain: Not on file  Food Insecurity: Not on file  Transportation Needs: Not on file  Physical Activity: Not on file  Stress: Not on file  Social Connections: Not on file  Intimate Partner Violence: Not on file    Family History: Family History  Problem Relation Age of Onset   Heart attack Father     Review of Systems: All other systems reviewed and are otherwise negative except as noted above.   Physical Exam: Vitals:   12/10/20 1108  BP: 110/70  Pulse: 72  SpO2: 94%  Weight: 218 lb 3.2 oz (99 kg)  Height: 6' (1.829 m)     GEN- The patient is well appearing, alert and oriented x 3 today.   HEENT: normocephalic, atraumatic; sclera clear, conjunctiva pink; hearing intact; oropharynx clear; neck supple, no JVP Lymph- no cervical lymphadenopathy Lungs- Clear to ausculation bilaterally, normal work of breathing.  No wheezes, rales, rhonchi Heart- Regular rate and rhythm, no murmurs, rubs or gallops, PMI not laterally displaced GI- soft, non-tender, non-distended, bowel sounds present, no hepatosplenomegaly Extremities- no clubbing or cyanosis. No edema; DP/PT/radial pulses 2+ bilaterally MS- no significant deformity or atrophy Skin- warm and dry, no rash or lesion; ICD pocket well healed Psych- euthymic mood, full  affect Neuro- strength and sensation are intact  ICD interrogation- reviewed in detail today,  See PACEART report  EKG:  EKG is ordered today. The ekg ordered today shows NSR at 72 bpm. QTc stable at 444 ms.  Recent Labs: 08/18/2020: ALT 26; Magnesium 2.1 08/19/2020: TSH 0.913 12/01/2020: BUN 21; Creatinine, Ser 1.29; Hemoglobin 19.2; Platelets 198; Potassium 5.1; Sodium 142   Wt Readings from Last 3 Encounters:  12/10/20 218 lb 3.2 oz (99 kg)  11/26/20 215 lb (97.5 kg)  10/11/20 216 lb (98 kg)     Other studies Reviewed: Additional studies/ records that were reviewed today include: Previous EP office ntoes   Assessment and Plan:  1.  Chronic systolic dysfunction s/p Medtronic dual chamber ICD  euvolemic today Stable on an appropriate medical regimen Normal ICD function See Pace Art report No changes today  2. VT No further symptomatic VT Had sustained AFL vs VT 6/20 in 140-150 range 6/20. Relatively asymptomatic John reviewed episode with Dr. Curt Bears for plan.   3. CAD Denies s/s ischemia  ADDENDUM Reviewed with Dr. Curt Bears (After patient had left). Increase sotalol to 120 mg BID with repeat EKG 7-10 days. John recheck BMEt/Mg at that time as well.   Current medicines are reviewed at length with the patient today.   The patient does not have concerns regarding his medicines.  The following changes were made today:  none; pending discussion with Dr. Curt Bears  Labs/ tests ordered today include:  Orders Placed This Encounter  Procedures   Comprehensive metabolic panel   CBC   Magnesium   EKG 12-Lead   Disposition:   Follow up with EP APP  3 months. Sooner pending labwork.   Jacalyn Lefevre, PA-C  12/10/2020 1:04 PM  [place] [facility] [place] [address] [phone] (office) [phone] (fax)

## 2020-12-11 LAB — COMPREHENSIVE METABOLIC PANEL
ALT: 15 IU/L (ref 0–44)
AST: 15 IU/L (ref 0–40)
Albumin/Globulin Ratio: 2.1 (ref 1.2–2.2)
Albumin: 4 g/dL (ref 3.7–4.7)
Alkaline Phosphatase: 81 IU/L (ref 44–121)
BUN/Creatinine Ratio: 17 (ref 10–24)
BUN: 14 mg/dL (ref 8–27)
Bilirubin Total: 0.7 mg/dL (ref 0.0–1.2)
CO2: 25 mmol/L (ref 20–29)
Calcium: 9.1 mg/dL (ref 8.6–10.2)
Chloride: 105 mmol/L (ref 96–106)
Creatinine, Ser: 0.84 mg/dL (ref 0.76–1.27)
Globulin, Total: 1.9 g/dL (ref 1.5–4.5)
Glucose: 233 mg/dL — ABNORMAL HIGH (ref 65–99)
Potassium: 4 mmol/L (ref 3.5–5.2)
Sodium: 144 mmol/L (ref 134–144)
Total Protein: 5.9 g/dL — ABNORMAL LOW (ref 6.0–8.5)
eGFR: 93 mL/min/{1.73_m2} (ref >59)

## 2020-12-11 LAB — CBC
Hematocrit: 51.7 % — ABNORMAL HIGH (ref 37.5–51.0)
Hemoglobin: 17.2 g/dL (ref 13.0–17.7)
MCH: 29.9 pg (ref 26.6–33.0)
MCHC: 33.3 g/dL (ref 31.5–35.7)
MCV: 90 fL (ref 79–97)
Platelets: 149 10*3/uL — ABNORMAL LOW (ref 150–450)
RBC: 5.76 x10E6/uL (ref 4.14–5.80)
RDW: 12.8 % (ref 11.6–15.4)
WBC: 7.9 10*3/uL (ref 3.4–10.8)

## 2020-12-11 LAB — MAGNESIUM: Magnesium: 2.2 mg/dL (ref 1.6–2.3)

## 2020-12-15 ENCOUNTER — Other Ambulatory Visit: Payer: Self-pay

## 2020-12-15 ENCOUNTER — Ambulatory Visit (INDEPENDENT_AMBULATORY_CARE_PROVIDER_SITE_OTHER): Payer: Medicare Other | Admitting: Podiatry

## 2020-12-15 DIAGNOSIS — L97522 Non-pressure chronic ulcer of other part of left foot with fat layer exposed: Secondary | ICD-10-CM | POA: Diagnosis not present

## 2020-12-15 DIAGNOSIS — E0843 Diabetes mellitus due to underlying condition with diabetic autonomic (poly)neuropathy: Secondary | ICD-10-CM

## 2020-12-15 NOTE — Progress Notes (Signed)
   Subjective:  70 y.o. male with PMHx of diabetes mellitus presenting to the office today for follow-up evaluation of an open wound to the left second toe this been present for a few months now.  It was a gradual onset.  He states that since the toenail was debrided there has been significant improvement of the wound.  No new complaints at this time  Past Medical History:  Diagnosis Date   Atrial flutter ([place])    s/p cardioversion   Coronary artery disease    Diabetes mellitus    GERD (gastroesophageal reflux disease)    History of nuclear stress test 04/04/2011   lexiscan; mod-large in size fixed inferolateral defect (scar); non-diagnostic for ischemia; low risk scan    Hyperlipidemia    Hypertension    Left foot drop    r/t past disk srugery - uses Kevlar brace   Myocardial infarction ([facility])    posterior MI   Shortness of breath    Sleep apnea    on CPAP; 04/28/2007 split-night - AHI during total sleep 44.43/hr and REM 72.56/hr    Objective/Physical Exam General: The patient is alert and oriented x3 in no acute distress.  Dermatology:  Wound #1 noted to the medial aspect of the left second toe measuring approximately 0.3 x 0.3 x 0.1 cm (LxWxD).   To the noted ulceration(s), there is no eschar. There is a moderate amount of slough, fibrin, and necrotic tissue noted. Granulation tissue and wound base is red. There is a minimal amount of serosanguineous drainage noted. There is no exposed bone muscle-tendon ligament or joint. There is no malodor. Periwound integrity is intact. Skin is warm, dry and supple bilateral lower extremities.  Vascular: Palpable pedal pulses bilaterally. No edema or erythema noted. Capillary refill within normal limits.  Neurological: Epicritic and protective threshold diminished bilaterally.   Musculoskeletal Exam: Range of motion within normal limits to all pedal and ankle joints bilateral. Muscle strength 5/5 in all groups bilateral.   Radiographic  exam: Normal osseous mineralization.  No osteolytic process noted around the second toe  Assessment: 1.  Ulcer left second toe secondary to diabetes mellitus 2. diabetes mellitus w/ peripheral neuropathy   Plan of Care:  1. Patient was evaluated.  Overall the wound is significantly improved.  Is very minimal. 2. medically necessary excisional debridement including subcutaneous tissue was performed using a tissue nipper and a chisel blade. Excisional debridement of all the necrotic nonviable tissue down to healthy bleeding viable tissue was performed with post-debridement measurements same as pre-. 3. the wound was cleansed and dry sterile dressing applied. 4.  Continue gentamicin cream daily 5.  Patient has an appointment next week with routine foot care  Edrick Kins, DPM [facility]  Dr. Edrick Kins, DPM    [address], [place] [address]                Office [phone]  Fax [phone]

## 2020-12-16 ENCOUNTER — Ambulatory Visit (INDEPENDENT_AMBULATORY_CARE_PROVIDER_SITE_OTHER): Payer: Medicare Other | Admitting: *Deleted

## 2020-12-16 ENCOUNTER — Other Ambulatory Visit: Payer: Medicare Other

## 2020-12-16 VITALS — BP 132/78 | HR 71 | Ht 72.0 in

## 2020-12-16 DIAGNOSIS — I4892 Unspecified atrial flutter: Secondary | ICD-10-CM | POA: Diagnosis not present

## 2020-12-16 DIAGNOSIS — Z79899 Other long term (current) drug therapy: Secondary | ICD-10-CM

## 2020-12-16 DIAGNOSIS — I472 Ventricular tachycardia, unspecified: Secondary | ICD-10-CM

## 2020-12-16 LAB — BASIC METABOLIC PANEL
BUN/Creatinine Ratio: 15 (ref 10–24)
BUN: 14 mg/dL (ref 8–27)
CO2: 24 mmol/L (ref 20–29)
Calcium: 9.3 mg/dL (ref 8.6–10.2)
Chloride: 103 mmol/L (ref 96–106)
Creatinine, Ser: 0.91 mg/dL (ref 0.76–1.27)
Glucose: 126 mg/dL — ABNORMAL HIGH (ref 65–99)
Potassium: 4.6 mmol/L (ref 3.5–5.2)
Sodium: 140 mmol/L (ref 134–144)
eGFR: 90 mL/min/{1.73_m2} (ref >59)

## 2020-12-16 LAB — MAGNESIUM: Magnesium: 2.3 mg/dL (ref 1.6–2.3)

## 2020-12-16 NOTE — Patient Instructions (Signed)
Medication Instructions:  Your physician recommends that you continue on your current medications as directed. Please refer to the Current Medication list given to you today.  *If you need a refill on your cardiac medications before your next appointment, please call your pharmacy*   Lab Work: None ordered   Testing/Procedures: None ordered   Follow-Up: At [facility], you and your health needs are our priority.  As part of our continuing mission to provide you with exceptional heart care, we have created designated Provider Care Teams.  These Care Teams include your primary Cardiologist (physician) and Advanced Practice Providers (APPs -  Physician Assistants and Nurse Practitioners) who all work together to provide you with the care you need, when you need it.  Your next appointment:   Keep scheduled follow up with Dr. Ileana Ladd in October   The format for your next appointment:   In Person  Provider:   Allegra Lai, MD    Thank you for choosing [facility]!!   Trinidad Curet, RN [phone]

## 2020-12-16 NOTE — Progress Notes (Signed)
1.  Reason for visit: EKG post Sotalol increase  2.  Name of MD requesting visit:  Camnitz  3. H&P:  VT  4.  ROS related to problem:  none  5.  Assessment and plan per MD:   EKG performed showing SR, HR 71 PR 262 ms QRS 146 ms QT/QTc 412/447 ms  Reviewed by DOD, Dr. Angelena Form, no orders received.

## 2020-12-22 ENCOUNTER — Other Ambulatory Visit: Payer: Self-pay

## 2020-12-22 ENCOUNTER — Encounter: Payer: Self-pay | Admitting: Podiatry

## 2020-12-22 ENCOUNTER — Ambulatory Visit (INDEPENDENT_AMBULATORY_CARE_PROVIDER_SITE_OTHER): Payer: Medicare Other | Admitting: Podiatry

## 2020-12-22 DIAGNOSIS — B351 Tinea unguium: Secondary | ICD-10-CM | POA: Diagnosis not present

## 2020-12-22 DIAGNOSIS — D689 Coagulation defect, unspecified: Secondary | ICD-10-CM | POA: Diagnosis not present

## 2020-12-22 DIAGNOSIS — M79676 Pain in unspecified toe(s): Secondary | ICD-10-CM

## 2020-12-22 DIAGNOSIS — E0843 Diabetes mellitus due to underlying condition with diabetic autonomic (poly)neuropathy: Secondary | ICD-10-CM

## 2020-12-22 NOTE — Progress Notes (Signed)
This patient returns to my office for at risk foot care.  This patient requires this care by a professional since this patient will be at risk due to having diabetes mellitus and coagulation defect.  Patient is taking eliquis.  This patient is unable to cut nails himself since the patient cannot reach his nails.These nails are painful walking and wearing shoes. He also says his ulcer has healed.  Patient has not been seen in over 6 months. This patient presents for at risk foot care today.  General Appearance  Alert, conversant and in no acute stress.  Vascular  Dorsalis pedis and posterior tibial  pulses are palpable  bilaterally.  Capillary return is within normal limits  bilaterally. Temperature is within normal limits  bilaterally.  Neurologic  Senn-Weinstein monofilament wire test within normal limits  bilaterally. Muscle power within normal limits bilaterally.  Nails Thick disfigured discolored nails with subungual debris  from hallux to fifth toes bilaterally. No evidence of bacterial infection or drainage bilaterally.  Orthopedic  No limitations of motion  feet .  No crepitus or effusions noted.  No bony pathology or digital deformities noted.  HAV  B/L.  Hammer toes  B/L.  Skin  normotropic skin with no porokeratosis noted bilaterally.  No signs of infections or ulcers noted.     Onychomycosis  Pain in right toes  Pain in left toes    Consent was obtained for treatment procedures.   Mechanical debridement of nails 1-5  bilaterally performed with a nail nipper.  Filed with dremel without incident.    Return office visit  3 months                   Told patient to return for periodic foot care and evaluation due to potential at risk complications.   Gardiner Barefoot DPM

## 2020-12-25 DIAGNOSIS — Z20822 Contact with and (suspected) exposure to covid-19: Secondary | ICD-10-CM | POA: Diagnosis not present

## 2020-12-28 ENCOUNTER — Emergency Department (HOSPITAL_COMMUNITY): Payer: Medicare Other

## 2020-12-28 ENCOUNTER — Emergency Department (HOSPITAL_COMMUNITY)
Admission: EM | Admit: 2020-12-28 | Discharge: 2020-12-28 | Disposition: A | Discharge status: Home / Self Care | Payer: Medicare Other | Attending: Emergency Medicine | Admitting: Emergency Medicine

## 2020-12-28 ENCOUNTER — Telehealth: Payer: Self-pay | Admitting: Internal Medicine

## 2020-12-28 DIAGNOSIS — I10 Essential (primary) hypertension: Secondary | ICD-10-CM | POA: Insufficient documentation

## 2020-12-28 DIAGNOSIS — I4892 Unspecified atrial flutter: Secondary | ICD-10-CM | POA: Insufficient documentation

## 2020-12-28 DIAGNOSIS — J449 Chronic obstructive pulmonary disease, unspecified: Secondary | ICD-10-CM | POA: Diagnosis not present

## 2020-12-28 DIAGNOSIS — I472 Ventricular tachycardia, unspecified: Secondary | ICD-10-CM

## 2020-12-28 DIAGNOSIS — Z7984 Long term (current) use of oral hypoglycemic drugs: Secondary | ICD-10-CM | POA: Diagnosis not present

## 2020-12-28 DIAGNOSIS — Z7901 Long term (current) use of anticoagulants: Secondary | ICD-10-CM | POA: Insufficient documentation

## 2020-12-28 DIAGNOSIS — R002 Palpitations: Secondary | ICD-10-CM | POA: Diagnosis not present

## 2020-12-28 DIAGNOSIS — I470 Re-entry ventricular arrhythmia: Secondary | ICD-10-CM | POA: Insufficient documentation

## 2020-12-28 DIAGNOSIS — I499 Cardiac arrhythmia, unspecified: Secondary | ICD-10-CM

## 2020-12-28 DIAGNOSIS — Z87891 Personal history of nicotine dependence: Secondary | ICD-10-CM | POA: Diagnosis not present

## 2020-12-28 DIAGNOSIS — E119 Type 2 diabetes mellitus without complications: Secondary | ICD-10-CM | POA: Diagnosis not present

## 2020-12-28 DIAGNOSIS — I2510 Atherosclerotic heart disease of native coronary artery without angina pectoris: Secondary | ICD-10-CM | POA: Insufficient documentation

## 2020-12-28 DIAGNOSIS — Z7951 Long term (current) use of inhaled steroids: Secondary | ICD-10-CM | POA: Insufficient documentation

## 2020-12-28 DIAGNOSIS — Z794 Long term (current) use of insulin: Secondary | ICD-10-CM | POA: Diagnosis not present

## 2020-12-28 DIAGNOSIS — R0602 Shortness of breath: Secondary | ICD-10-CM | POA: Diagnosis not present

## 2020-12-28 DIAGNOSIS — R55 Syncope and collapse: Secondary | ICD-10-CM

## 2020-12-28 LAB — CBC WITH DIFFERENTIAL/PLATELET
Abs Immature Granulocytes: 0.15 10*3/uL — ABNORMAL HIGH (ref 0.00–0.07)
Basophils Absolute: 0.1 10*3/uL (ref 0.0–0.1)
Basophils Relative: 1 %
Eosinophils Absolute: 0.2 10*3/uL (ref 0.0–0.5)
Eosinophils Relative: 2 %
HCT: 56.6 % — ABNORMAL HIGH (ref 39.0–52.0)
Hemoglobin: 17.8 g/dL — ABNORMAL HIGH (ref 13.0–17.0)
Immature Granulocytes: 2 %
Lymphocytes Relative: 25 %
Lymphs Abs: 2 10*3/uL (ref 0.7–4.0)
MCH: 29.7 pg (ref 26.0–34.0)
MCHC: 31.4 g/dL (ref 30.0–36.0)
MCV: 94.5 fL (ref 80.0–100.0)
Monocytes Absolute: 0.5 10*3/uL (ref 0.1–1.0)
Monocytes Relative: 6 %
Neutro Abs: 4.9 10*3/uL (ref 1.7–7.7)
Neutrophils Relative %: 64 %
Platelets: 161 10*3/uL (ref 150–400)
RBC: 5.99 MIL/uL — ABNORMAL HIGH (ref 4.22–5.81)
RDW: 13.7 % (ref 11.5–15.5)
WBC: 7.7 10*3/uL (ref 4.0–10.5)
nRBC: 0 % (ref 0.0–0.2)

## 2020-12-28 LAB — BASIC METABOLIC PANEL
Anion gap: 9 (ref 5–15)
BUN: 21 mg/dL (ref 8–23)
CO2: 23 mmol/L (ref 22–32)
Calcium: 9.6 mg/dL (ref 8.9–10.3)
Chloride: 110 mmol/L (ref 98–111)
Creatinine, Ser: 1.02 mg/dL (ref 0.61–1.24)
GFR, Estimated: >60 mL/min (ref >60)
Glucose, Bld: 159 mg/dL — ABNORMAL HIGH (ref 70–99)
Potassium: 3.9 mmol/L (ref 3.5–5.1)
Sodium: 142 mmol/L (ref 135–145)

## 2020-12-28 LAB — MAGNESIUM: Magnesium: 2.3 mg/dL (ref 1.7–2.4)

## 2020-12-28 LAB — TROPONIN I (HIGH SENSITIVITY)
Troponin I (High Sensitivity): 17 ng/L (ref <18)
Troponin I (High Sensitivity): 25 ng/L — ABNORMAL HIGH (ref <18)

## 2020-12-28 MED ORDER — SOTALOL HCL 160 MG PO TABS: 160.0000 mg | ORAL_TABLET | Freq: Two times a day (BID) | ORAL | 0 refills | Status: DC

## 2020-12-28 MED ORDER — SOTALOL HCL 80 MG PO TABS
160.0000 mg | ORAL_TABLET | Freq: Two times a day (BID) | ORAL | Status: DC
  Administered 2020-12-28: 160 mg via ORAL
  Filled 2020-12-28: qty 2

## 2020-12-28 MED ORDER — APIXABAN 5 MG PO TABS
5.0000 mg | ORAL_TABLET | Freq: Two times a day (BID) | ORAL | Status: DC
  Administered 2020-12-28: 5 mg via ORAL
  Filled 2020-12-28: qty 1

## 2020-12-28 NOTE — ED Provider Notes (Signed)
Emergency Medicine Provider Triage Evaluation Note  John [name] , a 70 y.o. male  was evaluated in triage.  Pt complains of palpitations and dizziness onset at 2130 tonight. Symptoms improved spontaneously around midnight. Also with associated SOB. No syncope. Hx of CAD with at least 11 stents, diabetes, atrial flutter, and Vtach. Had ICD placed in March and required cardioversion for Vtach 1 month ago. Denies any shocks from his device PTA. Compliant with all of his medications including his Eliquis.  Review of Systems  Positive: Palpitations, dizziness, SOB Negative: syncope  Physical Exam  BP 119/78 (BP Location: Right Arm)   Pulse 77   Temp 98.4 F (36.9 C)   Resp 20   SpO2 97%  Gen:   Awake, no distress   Resp:  Normal effort  MSK:   Moves extremities without difficulty  Other:  Heart RRR at this time  Medical Decision Making  Medically screening exam initiated at 1:33 AM.  Appropriate orders placed.  John [name] was informed that the remainder of the evaluation will be completed by another provider, this initial triage assessment does not replace that evaluation, and the importance of remaining in the ED until their evaluation is complete.  Palpitations   Antonietta Breach, PA-C 12/28/20 4037    Merryl Hacker, MD 12/28/20 [phone]

## 2020-12-28 NOTE — ED Provider Notes (Addendum)
Signed out that cardiology is coming to see, dispo per cardiology.  Cardiology indicates they have completed their assessment, and indicate to d/c to home, they will increase sotalol dose to 160 mg bid (see note below)  Pt currently asymptomatic, vitals normal - pt d/c to home per cardiology plan.      Lajean Saver, MD 12/28/20 1014      Lajean Saver, MD 12/28/20 1019

## 2020-12-28 NOTE — ED Notes (Signed)
Pacemaker interrogated. 

## 2020-12-28 NOTE — ED Notes (Signed)
Lab to add on troponin  

## 2020-12-28 NOTE — ED Provider Notes (Signed)
[facility] EMERGENCY DEPARTMENT Provider Note   CSN: [id number] Arrival date & time: 12/28/20  0035     History Chief Complaint  Patient presents with   Palpitations    John [name] is a 70 y.o. male.  HPI     This is a 70 year old male with a history of atrial flutter, coronary artery disease, diabetes, hypertension, hyperlipidemia who presents with palpitations and dizziness.  Patient reports around 9:30 PM he began to have palpitations and a heart rate in the 160s.  He describes feeling lightheaded at that time.  He also had associated shortness of breath.  Symptoms persisted for approximately 2 hours.  Upon arrival his symptoms had resolved.  He currently is on Eliquis and reports compliance with his medications.  Patient has an ICD but has not received any shocks.  He in the past has required cardioversion for atrial flutter and wide-complex tachycardia.  He states upon arrival to the room he has had a very short self-limited episode of sharp left-sided chest pain that was nonradiating.  It resolved spontaneously.  Reports recent increase of sotalol to 120 mg.  Denies any alcohol or drug use.  Past Medical History:  Diagnosis Date   Atrial flutter ([place])    s/p cardioversion   Coronary artery disease    Diabetes mellitus    GERD (gastroesophageal reflux disease)    History of nuclear stress test 04/04/2011   lexiscan; mod-large in size fixed inferolateral defect (scar); non-diagnostic for ischemia; low risk scan    Hyperlipidemia    Hypertension    Left foot drop    r/t past disk srugery - uses Kevlar brace   Myocardial infarction ([place])    posterior MI   Shortness of breath    Sleep apnea    on CPAP; 04/28/2007 split-night - AHI during total sleep 44.43/hr and REM 72.56/hr    Patient Active Problem List   Diagnosis Date Noted   Coagulation defect ([place]) 12/22/2020   Non-ST elevation (NSTEMI) myocardial infarction [facility])    Ventricular tachycardia  ([place]) 08/18/2020   Capsulitis 05/21/2020   Elevated troponin    Hyperglycemia due to diabetes mellitus ([place]) 02/29/2020   Thoracic aortic aneurysm ([place]) 02/29/2020   Hypotensive episode 02/29/2020   Obesity (BMI 30.0-34.9) 02/29/2020   Lung nodule 02/19/2020   Former smoker 02/19/2020   Healthcare maintenance 02/19/2020   Acoustic neuroma ([place]) 09/11/2017   Asymmetrical sensorineural hearing loss 09/11/2017   Encounter for cardioversion procedure    Anticoagulation adequate 06/06/2016   Fatigue 10/27/2015   Bradycardia 10/27/2015   Hyperlipidemia LDL goal <70 05/20/2015   OSA on CPAP 07/23/2013   Excessive daytime sleepiness 07/23/2013   Hypertension    Hyperlipidemia    Diabetes mellitus    Chest pain 08/18/2012   Tobacco abuse 08/18/2012   Unstable angina, cath [facility] 02/27/12 02/26/2012   CAD, multiple prior RCA PCI's. Last cath 2010, Myoview low risk Oct 2012 02/26/2012   DM type 2, goal HbA1c < 7% (HCC) 02/26/2012   HTN (hypertension) 02/26/2012   Dyslipidemia, low HDL. Intol to fish oil and Noacin 02/26/2012   Sleep apnea, on C-pap 02/26/2012   COPD (chronic obstructive pulmonary disease) ([place]) 02/26/2012   Smoking, quit one week ago 02/26/2012   GERD (gastroesophageal reflux disease) 02/26/2012    Past Surgical History:  Procedure Laterality Date   [place]  2010   6 stents total   CARDIAC CATHETERIZATION  01/2000  percutaneous transluminal coronary balloon angioplasty of mid RCA stenotic lesion   CARDIAC CATHETERIZATION  06/2006   no stenting; ischemic cardiomyopathy, EF 40-45%   CARDIOVERSION N/A 07/28/2016   Procedure: CARDIOVERSION;  Surgeon: Troy Sine, MD;  Location: [place];  Service: Cardiovascular;  Laterality: N/A;   CORONARY ANGIOPLASTY  09/1998   mid-distal RCA balloon dilatation, 4.5 & 5.0 stents    CORONARY ANGIOPLASTY WITH STENT PLACEMENT  03/1994   angioplasty & stenting (non-DES) of circumflex/prox ramus  intermedius   CORONARY ANGIOPLASTY WITH STENT PLACEMENT  10/1994   large iliac [id number] stent to RCA   [place]  12/2002   4.81mm stents x2 of RCA   CORONARY ANGIOPLASTY WITH STENT PLACEMENT  01/2005   cutting balloon arthrectomy of distal RCA & Cypher DES 3.5x13; cutting balloon arthrectomy of mid RCA with Cypher DES 3.5x18   CORONARY ANGIOPLASTY WITH STENT PLACEMENT  11/2008   stenting of mid RCA with 4.0x35mm driver, non-DES   ICD IMPLANT N/A 08/20/2020   Procedure: ICD IMPLANT;  Surgeon: Constance Haw, MD;  Location: [place] CV LAB;  Service: Cardiovascular;  Laterality: N/A;   INTRAVASCULAR PRESSURE WIRE/FFR STUDY N/A 03/02/2020   Procedure: INTRAVASCULAR PRESSURE WIRE/FFR STUDY;  Surgeon: Leonie Man, MD;  Location: [place] CV LAB;  Service: Cardiovascular;  Laterality: N/A;   LEFT HEART CATH AND CORONARY ANGIOGRAPHY N/A 03/02/2020   Procedure: LEFT HEART CATH AND CORONARY ANGIOGRAPHY;  Surgeon: Leonie Man, MD;  Location: [place] CV LAB;  Service: Cardiovascular;  Laterality: N/A;   LEFT HEART CATH AND CORONARY ANGIOGRAPHY N/A 08/19/2020   Procedure: LEFT HEART CATH AND CORONARY ANGIOGRAPHY;  Surgeon: Lorretta Harp, MD;  Location: [place] CV LAB;  Service: Cardiovascular;  Laterality: N/A;   LEFT HEART CATHETERIZATION WITH CORONARY ANGIOGRAM N/A 02/27/2012   Procedure: LEFT HEART CATHETERIZATION WITH CORONARY ANGIOGRAM;  Surgeon: Lorretta Harp, MD;  Location: [facility] CATH LAB;  Service: Cardiovascular;  Laterality: N/A;   TRANSTHORACIC ECHOCARDIOGRAM  07/29/2010   EF 50=55%, mod inf wall hypokinesis & mild post wall hypokinesis; LA mild-mod dilated; mild mitral annular calcif & mild MR; mild TR & elevated RV systolic pressure; AV mildly sclerotic; mild aortic root dilatation        Family History  Problem Relation Age of Onset   Heart attack Father     Social History   Tobacco Use   Smoking status: Former    Packs/day: 1.00     Years: 50.00    Pack years: 50.00    Types: Cigarettes    Quit date: 07/20/2016    Years since quitting: 4.4   Smokeless tobacco: Never  Substance Use Topics   Alcohol use: Not Currently    Alcohol/week: 0.0 standard drinks   Drug use: No    Home Medications Prior to Admission medications   Medication Sig Start Date End Date Taking? Authorizing Provider  acetaminophen (TYLENOL) 500 MG tablet Take 500 mg by mouth every 6 (six) hours as needed for moderate pain or headache.   Yes [provider]  albuterol (VENTOLIN HFA) 108 (90 Base) MCG/ACT inhaler Inhale 2 puffs into the lungs every 6 (six) hours as needed for wheezing or shortness of breath. 06/14/20  Yes Icard, Bradley L, DO  ELIQUIS 5 MG TABS tablet TAKE 1 TABLET BY MOUTH TWICE A DAY Patient taking differently: Take 5 mg by mouth 2 (two) times daily. 10/13/20  Yes Camnitz, Ocie Doyne, MD  ENTRESTO 24-26 MG  TAKE 1 TABLET BY MOUTH TWICE A DAY Patient taking differently: Take 1 tablet by mouth 2 (two) times daily. 10/08/20  Yes Troy Sine, MD  Fluticasone Furoate (ARNUITY ELLIPTA) 200 MCG/ACT AEPB Inhale 1 puff into the lungs daily. 07/21/20  Yes Icard, Bradley L, DO  glucose blood (ONETOUCH ULTRA) test strip USE TO CHECK BLOOD SUGAR 3 TIMES A DAY (E11.9) 12/09/20  Yes [name], MD  insulin aspart (NOVOLOG FLEXPEN) 100 UNIT/ML FlexPen INJECT 5-20 UNITS SUBCUTANEOUSLY WITH LUNCH AND DINNER Patient taking differently: Inject 5-20 Units into the skin See admin instructions. With lunch and dinner 06/16/20  Yes [name], MD  Insulin Pen Needle (NOVOFINE) 32G X 6 MM MISC 1 each by Other route daily. 08/19/19  Yes PickardCammie Mcgee, MD  JARDIANCE 25 MG TABS tablet TAKE 1 TABLET BY MOUTH EVERY DAY Patient taking differently: Take 25 mg by mouth daily. 11/09/20  Yes Pickard, [name], MD  LEVEMIR FLEXTOUCH 100 UNIT/ML FlexPen INJECT 46 UNITS INTO THE SKIN DAILY. DX:E11.9 Patient taking differently: Inject 46 Units into  the skin at bedtime. 08/25/20  Yes [name], MD  metoprolol succinate (TOPROL-XL) 100 MG 24 hr tablet Take 1 tablet (100 mg total) by mouth daily. Take with or immediately following a meal. 11/26/20  Yes Camnitz, Will Hassell Done, MD  montelukast (SINGULAIR) 10 MG tablet TAKE 1 TABLET BY MOUTH EVERY DAY Patient taking differently: Take 10 mg by mouth at bedtime. 08/19/20  Yes [name], MD  nitroGLYCERIN (NITROSTAT) 0.4 MG SL tablet PLACE 1 TABLET (0.4 MG TOTAL) UNDER THE TONGUE EVERY 5 (FIVE) MINUTES AS NEEDED FOR CHEST PAIN. 07/27/20  Yes Troy Sine, MD  rosuvastatin (CRESTOR) 20 MG tablet TAKE 1 TABLET BY MOUTH EVERY DAY Patient taking differently: Take 20 mg by mouth daily. 06/21/20  Yes Troy Sine, MD  sotalol (BETAPACE) 120 MG tablet Take 1 tablet (120 mg total) by mouth 2 (two) times daily. 12/10/20  Yes Shirley Friar, PA-C    Allergies    Benazepril, Fish allergy, and Omega-3 fatty acids  Review of Systems   Review of Systems  Constitutional:  Negative for fever.  Respiratory:  Positive for shortness of breath. Negative for cough.   Cardiovascular:  Positive for chest pain and palpitations. Negative for leg swelling.  Gastrointestinal:  Negative for abdominal pain, nausea and vomiting.  All other systems reviewed and are negative.  Physical Exam Updated Vital Signs BP 113/81   Pulse 69   Temp 98.4 F (36.9 C)   Resp 17   Ht 1.829 m (6')   Wt 100.2 kg   SpO2 95%   BMI 29.97 kg/m   Physical Exam Vitals and nursing note reviewed.  Constitutional:      Appearance: He is well-developed. He is not ill-appearing.  HENT:     Head: Normocephalic and atraumatic.     Nose: Nose normal.     Mouth/Throat:     Mouth: Mucous membranes are moist.  Eyes:     Pupils: Pupils are equal, round, and reactive to light.  Cardiovascular:     Rate and Rhythm: Normal rate and regular rhythm.     Heart sounds: Normal heart sounds. No murmur heard. Pulmonary:      Effort: Pulmonary effort is normal. No respiratory distress.     Breath sounds: Normal breath sounds. No wheezing.  Abdominal:     General: Bowel sounds are normal.     Palpations: Abdomen is soft.  Tenderness: There is no abdominal tenderness. There is no rebound.  Musculoskeletal:     Cervical back: Neck supple.     Right lower leg: No edema.     Left lower leg: No edema.  Lymphadenopathy:     Cervical: No cervical adenopathy.  Skin:    General: Skin is warm and dry.  Neurological:     Mental Status: He is alert and oriented to person, place, and time.  Psychiatric:        Mood and Affect: Mood normal.    ED Results / Procedures / Treatments   Labs (all labs ordered are listed, but only abnormal results are displayed) Labs Reviewed  BASIC METABOLIC PANEL - Abnormal; Notable for the following components:      Result Value   Glucose, Bld 159 (*)    All other components within normal limits  CBC WITH DIFFERENTIAL/PLATELET - Abnormal; Notable for the following components:   RBC 5.99 (*)    Hemoglobin 17.8 (*)    HCT 56.6 (*)    Abs Immature Granulocytes 0.15 (*)    All other components within normal limits  MAGNESIUM  TROPONIN I (HIGH SENSITIVITY)  TROPONIN I (HIGH SENSITIVITY)    EKG EKG Interpretation  Date/Time:  Tuesday December 28 2020 01:38:36 EDT Ventricular Rate:  70 PR Interval:  106 QRS Duration: 142 QT Interval:  428 QTC Calculation: 462 R Axis:   0 Text Interpretation: Atrial-paced rhythm Non-specific intra-ventricular conduction block Minimal voltage criteria for LVH, may be normal variant ( Cornell product ) Inferior infarct , age undetermined Anterolateral infarct , age undetermined Abnormal ECG No significant change was found Confirmed by Gerlene Fee [phone]) on 12/28/2020 1:53:11 AM  Radiology DG Chest 1 View  Result Date: 12/28/2020 CLINICAL DATA:  Shortness of breath and cardiac palpitations EXAM: CHEST  1 VIEW COMPARISON:  12/01/2020 FINDINGS:  Cardiac shadow is at the upper limits of normal in size. Defibrillator is again seen and stable. The lungs are well aerated bilaterally. No focal infiltrate or effusion is seen. No bony abnormality is noted. IMPRESSION: No active disease. Electronically Signed   By: [name] M.D.   On: 12/28/2020 01:59    Procedures Procedures   Medications Ordered in ED Medications - No data to display  ED Course  I have reviewed the triage vital signs and the nursing notes.  Pertinent labs & imaging results that were available during my care of the patient were reviewed by me and considered in my medical decision making (see chart for details).  Clinical Course as of 12/28/20 0556  Tue Dec 28, 2020  0405 Spoke with the [employer] rep.  Patient had episode from approximately 930 to just before midnight of ventricular arrhythmia.  It was below the threshold for shocking. [CH]  2951 Spoke with cardiology fellow regarding continued ventricular arrhythmia.  Recommends holding for daytime team to collaborate with electrophysiology regarding any further adjustments in medication.  Patient updated with plan of care. [CH]    Clinical Course User Index [CH] Leila Schuff, Barbette Hair, MD   MDM Rules/Calculators/A&P                          Patient presents with palpitations and near syncope.  He is nontoxic-appearing and initial vital signs are reassuring.  He is rate controlled at this time and EKG is without evidence of acute arrhythmia or ischemia.  [employer] device was interrogated.  It does appear he had a episode  of approximately 2.5 hours yesterday evening of ventricular tachycardia/arrhythmia.  This self aborted.  There was no shocking.  It was below the threshold.  Labs reviewed.  No significant metabolic derangements.  Normal magnesium.  Initial normal troponin at 17.  Patient did have a self-limited episode of chest pain while in the room.  Will repeat troponin.  Chest x-ray is without pneumothorax or  pneumonia.  See discussion with cardiology fellow above.  We will hold patient for cardiology evaluation and collaboration with electrophysiology.   Final Clinical Impression(s) / ED Diagnoses Final diagnoses:  Ventricular arrhythmia  Palpitations  Near syncope    Rx / DC Orders ED Discharge Orders     None        Brittiney Dicostanzo, Barbette Hair, MD 12/28/20 [phone]

## 2020-12-28 NOTE — ED Triage Notes (Signed)
Pt c/o palpitations lasting approx 2hrs from 2130-2330. Similar episode 2 days ago, brief & resolved. Seen in June, cardioverted & DC Endorses SHOB, dizziness w episodes [employer] pacemaker/ICD in place

## 2020-12-28 NOTE — Consult Note (Addendum)
ELECTROPHYSIOLOGY CONSULT NOTE    Patient ID: John [name] MRN: [id number], DOB/AGE: 11-05-1950 70 y.o.  Admit date: 12/28/2020 Date of Consult: 12/28/2020  Primary Physician: [name], MD Primary Cardiologist: Shelva Majestic, MD  Electrophysiologist: Dr. Curt Bears  Referring Provider: Dr. Dina Rich  Patient Profile: John [name] is a 70 y.o. male with a history of chronic systolic CHF, ICM, Atrial flutter, and recurrent VT well known to the EP team who is being seen today for the evaluation of VT at the request of Dr. Dina Rich.  HPI:  John [name] is a 70 y.o. male with medical history as above.  Last seen in EP clinic 12/10/2020. With recurrent VT, Sotalol increased to 120 mg BID.  Pt presented to [facility] overnight after palpitations started around 930 pm and a HR in the 160s. Symptoms persistent for nearly 2 hours. By the time he arrived to [facility] his symptoms had resolved. He did not receive any shocks. He did report a short episode of sharp, left-sided chest pain that was non-radiating. Denies ETOH or drug use.  Pt states he was watching tv when it started. No recent illnesses. Overall he had a good day until this episode started. Took all of his medications as directed.   Past Medical History:  Diagnosis Date   Atrial flutter ([place])    s/p cardioversion   Coronary artery disease    Diabetes mellitus    GERD (gastroesophageal reflux disease)    History of nuclear stress test 04/04/2011   lexiscan; mod-large in size fixed inferolateral defect (scar); non-diagnostic for ischemia; low risk scan    Hyperlipidemia    Hypertension    Left foot drop    r/t past disk srugery - uses Kevlar brace   Myocardial infarction ([place])    posterior MI   Shortness of breath    Sleep apnea    on CPAP; 04/28/2007 split-night - AHI during total sleep 44.43/hr and REM 72.56/hr     Surgical History:  Past Surgical History:  Procedure Laterality Date   [place]  2010   6 stents total   CARDIAC CATHETERIZATION  01/2000   percutaneous transluminal coronary balloon angioplasty of mid RCA stenotic lesion   CARDIAC CATHETERIZATION  06/2006   no stenting; ischemic cardiomyopathy, EF 40-45%   CARDIOVERSION N/A 07/28/2016   Procedure: CARDIOVERSION;  Surgeon: Troy Sine, MD;  Location: [facility];  Service: Cardiovascular;  Laterality: N/A;   CORONARY ANGIOPLASTY  09/1998   mid-distal RCA balloon dilatation, 4.5 & 5.0 stents    CORONARY ANGIOPLASTY WITH STENT PLACEMENT  03/1994   angioplasty & stenting (non-DES) of circumflex/prox ramus intermedius   CORONARY ANGIOPLASTY WITH STENT PLACEMENT  10/1994   large iliac [id number] stent to RCA   [place]  12/2002   4.71mm stents x2 of RCA   CORONARY ANGIOPLASTY WITH STENT PLACEMENT  01/2005   cutting balloon arthrectomy of distal RCA & Cypher DES 3.5x13; cutting balloon arthrectomy of mid RCA with Cypher DES 3.5x18   CORONARY ANGIOPLASTY WITH STENT PLACEMENT  11/2008   stenting of mid RCA with 4.0x39mm driver, non-DES   ICD IMPLANT N/A 08/20/2020   Procedure: ICD IMPLANT;  Surgeon: Constance Haw, MD;  Location: [facility];  Service: Cardiovascular;  Laterality: N/A;   INTRAVASCULAR PRESSURE WIRE/FFR STUDY N/A 03/02/2020   Procedure: INTRAVASCULAR PRESSURE WIRE/FFR STUDY;  Surgeon: Leonie Man, MD;  Location: [facility];  Service: Cardiovascular;  Laterality: N/A;   LEFT HEART CATH AND CORONARY ANGIOGRAPHY N/A 03/02/2020   Procedure: LEFT HEART CATH AND CORONARY ANGIOGRAPHY;  Surgeon: Leonie Man, MD;  Location: [facility];  Service: Cardiovascular;  Laterality: N/A;   LEFT HEART CATH AND CORONARY ANGIOGRAPHY N/A 08/19/2020   Procedure: LEFT HEART CATH AND CORONARY ANGIOGRAPHY;  Surgeon: Lorretta Harp, MD;  Location: [facility];  Service: Cardiovascular;  Laterality: N/A;   LEFT HEART CATHETERIZATION WITH CORONARY ANGIOGRAM  N/A 02/27/2012   Procedure: LEFT HEART CATHETERIZATION WITH CORONARY ANGIOGRAM;  Surgeon: Lorretta Harp, MD;  Location: [facility];  Service: Cardiovascular;  Laterality: N/A;   TRANSTHORACIC ECHOCARDIOGRAM  07/29/2010   EF 50=55%, mod inf wall hypokinesis & mild post wall hypokinesis; LA mild-mod dilated; mild mitral annular calcif & mild MR; mild TR & elevated RV systolic pressure; AV mildly sclerotic; mild aortic root dilatation      (Not in a hospital admission)   Inpatient Medications:   apixaban  5 mg Oral BID   sotalol  160 mg Oral Q12H    Allergies:  Allergies  Allergen Reactions   Benazepril Other (See Comments)    hyperkalemia   Fish Allergy Itching   Omega-3 Fatty Acids Hives and Itching    Social History   Socioeconomic History   Marital status: Married    Spouse name: Not on file   Number of children: 3   Years of education: Not on file   Highest education level: Not on file  Occupational History   Occupation: Best boy: [employer]    Comment: [place], [place]. [place]  Tobacco Use   Smoking status: Former    Packs/day: 1.00    Years: 50.00    Pack years: 50.00    Types: Cigarettes    Quit date: 07/20/2016    Years since quitting: 4.4   Smokeless tobacco: Never  Substance and Sexual Activity   Alcohol use: Not Currently    Alcohol/week: 0.0 standard drinks   Drug use: No   Sexual activity: Yes  Other Topics Concern   Not on file  Social History Narrative   Not on file   Social Determinants of Health   Financial Resource Strain: Not on file  Food Insecurity: Not on file  Transportation Needs: Not on file  Physical Activity: Not on file  Stress: Not on file  Social Connections: Not on file  Intimate Partner Violence: Not on file     Family History  Problem Relation Age of Onset   Heart attack Father      Review of Systems: All other systems reviewed and are otherwise negative except as noted above.  Physical Exam: Vitals:    12/28/20 0545 12/28/20 0600 12/28/20 0630 12/28/20 0645  BP: 118/76 109/90 104/75 117/83  Pulse: 69 73 69 77  Resp: 16 13 20 17   Temp:      SpO2: 95% 98% 95% 94%  Weight:      Height:        GEN- The patient is well appearing, alert and oriented x 3 today.   HEENT: normocephalic, atraumatic; sclera clear, conjunctiva pink; hearing intact; oropharynx clear; neck supple Lungs- Clear to ausculation bilaterally, normal work of breathing.  No wheezes, rales, rhonchi Heart- Regular rate and rhythm, no murmurs, rubs or gallops GI- soft, non-tender, non-distended, bowel sounds present Extremities- no clubbing, cyanosis, or edema; DP/PT/radial pulses 2+ bilaterally MS- no significant deformity or  atrophy Skin- warm and dry, no rash or lesion Psych- euthymic mood, full affect Neuro- strength and sensation are intact  Labs:   Lab Results  Component Value Date   WBC 7.7 12/28/2020   HGB 17.8 (H) 12/28/2020   HCT 56.6 (H) 12/28/2020   MCV 94.5 12/28/2020   PLT 161 12/28/2020    Recent Labs  Lab 12/28/20 0142  NA 142  K 3.9  CL 110  CO2 23  BUN 21  CREATININE 1.02  CALCIUM 9.6  GLUCOSE 159*      Radiology/Studies: DG Chest 1 View  Result Date: 12/28/2020 CLINICAL DATA:  Shortness of breath and cardiac palpitations EXAM: CHEST  1 VIEW COMPARISON:  12/01/2020 FINDINGS: Cardiac shadow is at the upper limits of normal in size. Defibrillator is again seen and stable. The lungs are well aerated bilaterally. No focal infiltrate or effusion is seen. No bony abnormality is noted. IMPRESSION: No active disease. Electronically Signed   By: [name] M.D.   On: 12/28/2020 01:59   DG Chest Port 1 View  Result Date: 12/01/2020 CLINICAL DATA:  Sudden onset of chest pain. EXAM: PORTABLE CHEST 1 VIEW COMPARISON:  August 21, 2020 FINDINGS: Dual lead cardiac pacemaker. Enlarged cardiac silhouette.  Mediastinal contours appear intact. There is no evidence of focal airspace consolidation, pleural  effusion or pneumothorax. Possible pulmonary vascular congestion. Osseous structures are without acute abnormality. Soft tissues are grossly normal. IMPRESSION: 1. Enlarged cardiac silhouette. 2. Possible pulmonary vascular congestion. Electronically Signed   By: Fidela Salisbury M.D.   On: 12/01/2020 10:46   CUP PACEART INCLINIC DEVICE CHECK  Result Date: 12/10/2020 ICD check in clinic. Normal device function. Thresholds and sensing consistent with previous device measurements. Impedance trends stable over time. No mode switches. Pt had NSVT and on 6/20 had a prolonged episode of what appears to be a slow VT in the 150 range after reviewing with Dr. Curt Bears. Histogram distribution appropriate for patient and level of activity. No changes made this session. Device programmed at appropriate safety margins. Estimated longevity 9 yr, 6 mo. Pt enrolled in remote follow-up. Patient education completed   EKG: shows a paced rhythm at 70 bpm with stable QTc at 460 ms (personally reviewed)  TELEMETRY: NSR 60-80s, occasional PVCs (personally reviewed)  Device History: Medtronic Dual Chamber ICD implanted 08/20/2020 for ICM and VT History of AAD therapy: Yes; currently on sotalol. Mexitel stopped due to ineffectiveness.    VT episodes  12/11/20 NSVT 12/17/20 AF RVR 150s 3 minutes 12/18/20 VT , ~170,  28 minutes 12/20/20 NSVT 140, 14 seconds 12/22/20 VT, ~170,  9.5 minutes 12/23/20 VT, ~170, 6.5 minutes 12/27/20 VT, ~170, 2 hrs 30 minutes   VT episode from 12/27/20   Assessment/Plan:  Recurrent VT No further overnight.   Increase sotalol to 160 mg BID K 3.9, Mg 2.3  2.  Chronic systolic dysfunction s/p Medtronic dual chamber ICD  Volume status appears OK on exam. Stable on an appropriate medical regimen Normal ICD function See Pace Art report No changes today   3. CAD Denies s/s ischemia   For questions or updates, please contact [facility] Please consult [email or web address] for contact info under  Cardiology/STEMI.  Signed, Shirley Friar, PA-C  12/28/2020 7:41 AM      I have seen and examined this patient with [name].  Agree with above, note added to reflect my findings.  On exam, RRR, no murmurs.  Patient presented to the hospital with 2 and half hours of  VT found on his ICD.  He was symptomatic with shortness of breath.  He is currently on sotalol.  We Shacoya Burkhammer increase his sotalol to 160 mg to twice daily.  Should he have more episodes of VT, amiodarone or ablation would be options.  We Nile Dorning arrange for follow-up in EP clinic.  Okay for discharge from the emergency room.  Amey Hossain M. Kaemon Barnett MD 12/28/2020 10:20 AM

## 2020-12-28 NOTE — Discharge Instructions (Addendum)
It was our pleasure to provide your ER care today - we hope that you feel better.  Our cardiologists indicate for you to increase your sotalol dose to 160 mg 2x/day.   Follow up with cardiology closely as discussed with them.   Return to ER if worse, new symptoms, chest pain, trouble breathing, fainting, persistent fast heart beating, or other concern.

## 2020-12-28 NOTE — Telephone Encounter (Signed)
Paged by John [name] to report heart rates in the 150s-160s. He reports this has been going on for nearly two hours. He is dizzy and lightheaded when he stands up. No syncope. No ICD shocks. Mildly short of breath but no chest pain. Given his history of recurrent VT, I have asked him to come to the emergency department for evaluation.

## 2020-12-29 ENCOUNTER — Other Ambulatory Visit: Payer: Self-pay

## 2020-12-29 MED ORDER — ARNUITY ELLIPTA 200 MCG/ACT IN AEPB: 1.0000 | INHALATION_SPRAY | Freq: Every day | RESPIRATORY_TRACT | 6 refills | Status: DC

## 2020-12-29 NOTE — Telephone Encounter (Signed)
Pt started increase Sotalol dose last night. Scheduled for EKG follow up next Thursday, when Dr. Curt Bears in the office. Patient verbalized understanding and agreeable to plan.

## 2021-01-06 ENCOUNTER — Ambulatory Visit (INDEPENDENT_AMBULATORY_CARE_PROVIDER_SITE_OTHER): Payer: Medicare Other | Admitting: *Deleted

## 2021-01-06 ENCOUNTER — Other Ambulatory Visit: Payer: Self-pay

## 2021-01-06 VITALS — Ht 72.0 in | Wt 216.2 lb

## 2021-01-06 DIAGNOSIS — Z79899 Other long term (current) drug therapy: Secondary | ICD-10-CM | POA: Diagnosis not present

## 2021-01-06 DIAGNOSIS — I472 Ventricular tachycardia, unspecified: Secondary | ICD-10-CM

## 2021-01-06 NOTE — Progress Notes (Signed)
1.  Reason for visit: Sotalol increase  2.  Name of MD requesting visit:  Cam[place]  3. H&P:  VT  4.  ROS related to problem:  see epic  5.  Assessment and plan per MD:   EKG performed showing sinus rhythm , HR 72. PR 266 ms QRS 146 ms QT/QTc 436/477 ms  Dr Curt Bears reviewed, no changes, continue current treatment plan. Pt aware office f/u for next week will be r/s out 3/4 weeks. Aware scheduler will call to arrange

## 2021-01-10 ENCOUNTER — Other Ambulatory Visit: Payer: Self-pay | Admitting: Family Medicine

## 2021-01-13 ENCOUNTER — Ambulatory Visit: Payer: Medicare Other | Admitting: Student

## 2021-01-17 ENCOUNTER — Emergency Department (HOSPITAL_COMMUNITY): Payer: Medicare Other

## 2021-01-17 ENCOUNTER — Telehealth: Payer: Self-pay | Admitting: Medical

## 2021-01-17 ENCOUNTER — Inpatient Hospital Stay (HOSPITAL_COMMUNITY)
Admission: EM | Admit: 2021-01-17 | Discharge: 2021-01-21 | DRG: 274 | Disposition: A | Discharge status: Home / Self Care | Payer: Medicare Other | Attending: Cardiology | Admitting: Cardiology

## 2021-01-17 DIAGNOSIS — J849 Interstitial pulmonary disease, unspecified: Secondary | ICD-10-CM | POA: Diagnosis present

## 2021-01-17 DIAGNOSIS — M549 Dorsalgia, unspecified: Secondary | ICD-10-CM | POA: Diagnosis present

## 2021-01-17 DIAGNOSIS — I493 Ventricular premature depolarization: Secondary | ICD-10-CM | POA: Diagnosis present

## 2021-01-17 DIAGNOSIS — Z20822 Contact with and (suspected) exposure to covid-19: Secondary | ICD-10-CM | POA: Diagnosis not present

## 2021-01-17 DIAGNOSIS — M21372 Foot drop, left foot: Secondary | ICD-10-CM | POA: Diagnosis present

## 2021-01-17 DIAGNOSIS — I110 Hypertensive heart disease with heart failure: Secondary | ICD-10-CM | POA: Diagnosis present

## 2021-01-17 DIAGNOSIS — Z955 Presence of coronary angioplasty implant and graft: Secondary | ICD-10-CM

## 2021-01-17 DIAGNOSIS — Z888 Allergy status to other drugs, medicaments and biological substances status: Secondary | ICD-10-CM

## 2021-01-17 DIAGNOSIS — Z95810 Presence of automatic (implantable) cardiac defibrillator: Secondary | ICD-10-CM

## 2021-01-17 DIAGNOSIS — I959 Hypotension, unspecified: Secondary | ICD-10-CM | POA: Diagnosis not present

## 2021-01-17 DIAGNOSIS — I4892 Unspecified atrial flutter: Secondary | ICD-10-CM | POA: Diagnosis present

## 2021-01-17 DIAGNOSIS — J449 Chronic obstructive pulmonary disease, unspecified: Secondary | ICD-10-CM | POA: Diagnosis present

## 2021-01-17 DIAGNOSIS — R000 Tachycardia, unspecified: Secondary | ICD-10-CM | POA: Diagnosis not present

## 2021-01-17 DIAGNOSIS — I472 Ventricular tachycardia, unspecified: Secondary | ICD-10-CM

## 2021-01-17 DIAGNOSIS — E119 Type 2 diabetes mellitus without complications: Secondary | ICD-10-CM | POA: Diagnosis present

## 2021-01-17 DIAGNOSIS — R002 Palpitations: Secondary | ICD-10-CM | POA: Diagnosis not present

## 2021-01-17 DIAGNOSIS — I252 Old myocardial infarction: Secondary | ICD-10-CM

## 2021-01-17 DIAGNOSIS — I255 Ischemic cardiomyopathy: Secondary | ICD-10-CM | POA: Diagnosis present

## 2021-01-17 DIAGNOSIS — G8929 Other chronic pain: Secondary | ICD-10-CM | POA: Diagnosis present

## 2021-01-17 DIAGNOSIS — G4733 Obstructive sleep apnea (adult) (pediatric): Secondary | ICD-10-CM | POA: Diagnosis present

## 2021-01-17 DIAGNOSIS — Z91013 Allergy to seafood: Secondary | ICD-10-CM

## 2021-01-17 DIAGNOSIS — E785 Hyperlipidemia, unspecified: Secondary | ICD-10-CM | POA: Diagnosis present

## 2021-01-17 DIAGNOSIS — Z87891 Personal history of nicotine dependence: Secondary | ICD-10-CM

## 2021-01-17 DIAGNOSIS — K219 Gastro-esophageal reflux disease without esophagitis: Secondary | ICD-10-CM | POA: Diagnosis present

## 2021-01-17 DIAGNOSIS — Z794 Long term (current) use of insulin: Secondary | ICD-10-CM

## 2021-01-17 DIAGNOSIS — I2510 Atherosclerotic heart disease of native coronary artery without angina pectoris: Secondary | ICD-10-CM | POA: Diagnosis present

## 2021-01-17 DIAGNOSIS — Z79899 Other long term (current) drug therapy: Secondary | ICD-10-CM

## 2021-01-17 DIAGNOSIS — Z8249 Family history of ischemic heart disease and other diseases of the circulatory system: Secondary | ICD-10-CM

## 2021-01-17 DIAGNOSIS — Z7901 Long term (current) use of anticoagulants: Secondary | ICD-10-CM

## 2021-01-17 DIAGNOSIS — I5022 Chronic systolic (congestive) heart failure: Secondary | ICD-10-CM | POA: Diagnosis not present

## 2021-01-17 LAB — CBC WITH DIFFERENTIAL/PLATELET
Abs Immature Granulocytes: 0.14 10*3/uL — ABNORMAL HIGH (ref 0.00–0.07)
Basophils Absolute: 0.1 10*3/uL (ref 0.0–0.1)
Basophils Relative: 1 %
Eosinophils Absolute: 0.3 10*3/uL (ref 0.0–0.5)
Eosinophils Relative: 4 %
HCT: 55.6 % — ABNORMAL HIGH (ref 39.0–52.0)
Hemoglobin: 17.4 g/dL — ABNORMAL HIGH (ref 13.0–17.0)
Immature Granulocytes: 2 %
Lymphocytes Relative: 19 %
Lymphs Abs: 1.8 10*3/uL (ref 0.7–4.0)
MCH: 29.7 pg (ref 26.0–34.0)
MCHC: 31.3 g/dL (ref 30.0–36.0)
MCV: 94.9 fL (ref 80.0–100.0)
Monocytes Absolute: 0.7 10*3/uL (ref 0.1–1.0)
Monocytes Relative: 7 %
Neutro Abs: 6.4 10*3/uL (ref 1.7–7.7)
Neutrophils Relative %: 67 %
Platelets: 163 10*3/uL (ref 150–400)
RBC: 5.86 MIL/uL — ABNORMAL HIGH (ref 4.22–5.81)
RDW: 13.5 % (ref 11.5–15.5)
WBC: 9.4 10*3/uL (ref 4.0–10.5)
nRBC: 0 % (ref 0.0–0.2)

## 2021-01-17 NOTE — Telephone Encounter (Signed)
   Patient called the after hours line reporting elevated HR's for the past hour and 40 minutes. He reports around 5:45pm it felt like someone flipped a switch and he felt bad. HR 155 with BP 90/62. He reports this persisted until ~30 minutes prior to my call when the switch flipped off and he suddenly felt back to normal. Repeat vitals were BP 110/72 and HR 85 with last vitals check just prior to call 109/69 HR 79 for the past 30 minutes. He has had very similar episodes in the past. No complaints of chest pain or SOB. Favor very close watchful waiting with low threshold for ED evaluation for recurrent symptoms. Will route to EP team to see if an earlier appointment is available - currently scheduled to see [name], PA-C 02/04/21.   Abigail Butts, PA-C 01/17/21; 8:05 PM

## 2021-01-17 NOTE — ED Notes (Signed)
Triage RN made aware of pts updated vitals.

## 2021-01-17 NOTE — ED Provider Notes (Signed)
Emergency Medicine Provider Triage Evaluation Note  John [name] , a 70 y.o. male  was evaluated in triage.  Pt complains of 2 episodes of palpitations with associated hypotension.  First episode happened around 6 PM.  It lasted an hour and 20 minutes, resolved spontaneously and recurred several hours later.  Patient reports that this episode also resolved however he now feels significantly weak and short of breath.  No changes to activity or medication regimen.  No full syncope tonight.  No nausea or vomiting.  No chest pain..  Patient has a history of V. tach and an ICD.  Patient denies firing of the ICD tonight.  Review of Systems  Positive: Palpitations, hypertension, lightheadedness shortness of breath Negative: Nausea, vomiting, syncope  Physical Exam  BP 112/82 (BP Location: Left Arm)   Pulse 80   Temp 99 F (37.2 C)   Resp 20   SpO2 96%  Gen:   Awake, no distress, tired appearing Resp:  Normal effort clear and equal breath sounds MSK:   Moves extremities without difficulty 2+ pitting edema of the bilateral lower extremities Other:  Regular rate and rhythm  Medical Decision Making  Medically screening exam initiated at 10:28 PM.  Appropriate orders placed.  John [name] was informed that the remainder of the evaluation will be completed by another provider, this initial triage assessment does not replace that evaluation, and the importance of remaining in the ED until their evaluation is complete.  Suspect runs of V. tach tonight.  Will interrogate ICD and get basic blood work.  Not currently in V. tach.   John [name], John [name] 01/17/21 Myrtle, [place], DO 01/17/21 [id number]

## 2021-01-17 NOTE — ED Triage Notes (Signed)
Pt c/o elevated HR (approx 150), & low BP (approx 88-90/60). Hx aflutter, VT, CHF, ICM, states it feels like someone "flips a switch." Several episodes of same tonight, states he feels episode has passed now, concerned for another. Seen here 7/12 for same

## 2021-01-18 ENCOUNTER — Other Ambulatory Visit: Payer: Self-pay

## 2021-01-18 ENCOUNTER — Inpatient Hospital Stay (HOSPITAL_COMMUNITY): Payer: Medicare Other

## 2021-01-18 DIAGNOSIS — I110 Hypertensive heart disease with heart failure: Secondary | ICD-10-CM | POA: Diagnosis not present

## 2021-01-18 DIAGNOSIS — K219 Gastro-esophageal reflux disease without esophagitis: Secondary | ICD-10-CM | POA: Diagnosis present

## 2021-01-18 DIAGNOSIS — E119 Type 2 diabetes mellitus without complications: Secondary | ICD-10-CM | POA: Diagnosis not present

## 2021-01-18 DIAGNOSIS — I472 Ventricular tachycardia: Secondary | ICD-10-CM | POA: Diagnosis not present

## 2021-01-18 DIAGNOSIS — Z91013 Allergy to seafood: Secondary | ICD-10-CM | POA: Diagnosis not present

## 2021-01-18 DIAGNOSIS — M549 Dorsalgia, unspecified: Secondary | ICD-10-CM | POA: Diagnosis present

## 2021-01-18 DIAGNOSIS — Z794 Long term (current) use of insulin: Secondary | ICD-10-CM | POA: Diagnosis not present

## 2021-01-18 DIAGNOSIS — I4892 Unspecified atrial flutter: Secondary | ICD-10-CM | POA: Diagnosis not present

## 2021-01-18 DIAGNOSIS — Z7901 Long term (current) use of anticoagulants: Secondary | ICD-10-CM | POA: Diagnosis not present

## 2021-01-18 DIAGNOSIS — Z9989 Dependence on other enabling machines and devices: Secondary | ICD-10-CM | POA: Diagnosis not present

## 2021-01-18 DIAGNOSIS — I255 Ischemic cardiomyopathy: Secondary | ICD-10-CM | POA: Diagnosis not present

## 2021-01-18 DIAGNOSIS — Z955 Presence of coronary angioplasty implant and graft: Secondary | ICD-10-CM | POA: Diagnosis not present

## 2021-01-18 DIAGNOSIS — G4733 Obstructive sleep apnea (adult) (pediatric): Secondary | ICD-10-CM | POA: Diagnosis present

## 2021-01-18 DIAGNOSIS — G8929 Other chronic pain: Secondary | ICD-10-CM | POA: Diagnosis present

## 2021-01-18 DIAGNOSIS — I5022 Chronic systolic (congestive) heart failure: Secondary | ICD-10-CM | POA: Diagnosis not present

## 2021-01-18 DIAGNOSIS — Z8249 Family history of ischemic heart disease and other diseases of the circulatory system: Secondary | ICD-10-CM | POA: Diagnosis not present

## 2021-01-18 DIAGNOSIS — I2510 Atherosclerotic heart disease of native coronary artery without angina pectoris: Secondary | ICD-10-CM | POA: Diagnosis not present

## 2021-01-18 DIAGNOSIS — Z888 Allergy status to other drugs, medicaments and biological substances status: Secondary | ICD-10-CM | POA: Diagnosis not present

## 2021-01-18 DIAGNOSIS — Z87891 Personal history of nicotine dependence: Secondary | ICD-10-CM | POA: Diagnosis not present

## 2021-01-18 DIAGNOSIS — J449 Chronic obstructive pulmonary disease, unspecified: Secondary | ICD-10-CM | POA: Diagnosis not present

## 2021-01-18 DIAGNOSIS — E785 Hyperlipidemia, unspecified: Secondary | ICD-10-CM | POA: Diagnosis present

## 2021-01-18 DIAGNOSIS — I252 Old myocardial infarction: Secondary | ICD-10-CM | POA: Diagnosis not present

## 2021-01-18 DIAGNOSIS — Z20822 Contact with and (suspected) exposure to covid-19: Secondary | ICD-10-CM | POA: Diagnosis not present

## 2021-01-18 DIAGNOSIS — J849 Interstitial pulmonary disease, unspecified: Secondary | ICD-10-CM | POA: Diagnosis not present

## 2021-01-18 DIAGNOSIS — M21372 Foot drop, left foot: Secondary | ICD-10-CM | POA: Diagnosis present

## 2021-01-18 LAB — CBG MONITORING, ED
Glucose-Capillary: 122 mg/dL — ABNORMAL HIGH (ref 70–99)
Glucose-Capillary: 139 mg/dL — ABNORMAL HIGH (ref 70–99)
Glucose-Capillary: 164 mg/dL — ABNORMAL HIGH (ref 70–99)
Glucose-Capillary: 168 mg/dL — ABNORMAL HIGH (ref 70–99)

## 2021-01-18 LAB — BASIC METABOLIC PANEL
Anion gap: 8 (ref 5–15)
BUN: 19 mg/dL (ref 8–23)
CO2: 22 mmol/L (ref 22–32)
Calcium: 9 mg/dL (ref 8.9–10.3)
Chloride: 110 mmol/L (ref 98–111)
Creatinine, Ser: 0.99 mg/dL (ref 0.61–1.24)
GFR, Estimated: >60 mL/min (ref >60)
Glucose, Bld: 129 mg/dL — ABNORMAL HIGH (ref 70–99)
Potassium: 4.1 mmol/L (ref 3.5–5.1)
Sodium: 140 mmol/L (ref 135–145)

## 2021-01-18 LAB — URINALYSIS, COMPLETE (UACMP) WITH MICROSCOPIC
Bacteria, UA: NONE SEEN
Bilirubin Urine: NEGATIVE
Glucose, UA: >=500 mg/dL — AB
Hgb urine dipstick: NEGATIVE
Ketones, ur: 5 mg/dL — AB
Leukocytes,Ua: NEGATIVE
Nitrite: NEGATIVE
Protein, ur: NEGATIVE mg/dL
Specific Gravity, Urine: 1.024 (ref 1.005–1.030)
pH: 5 (ref 5.0–8.0)

## 2021-01-18 LAB — COMPREHENSIVE METABOLIC PANEL
ALT: 20 U/L (ref 0–44)
AST: 16 U/L (ref 15–41)
Albumin: 3.6 g/dL (ref 3.5–5.0)
Alkaline Phosphatase: 69 U/L (ref 38–126)
Anion gap: 9 (ref 5–15)
BUN: 19 mg/dL (ref 8–23)
CO2: 20 mmol/L — ABNORMAL LOW (ref 22–32)
Calcium: 9.5 mg/dL (ref 8.9–10.3)
Chloride: 111 mmol/L (ref 98–111)
Creatinine, Ser: 0.98 mg/dL (ref 0.61–1.24)
GFR, Estimated: >60 mL/min (ref >60)
Glucose, Bld: 186 mg/dL — ABNORMAL HIGH (ref 70–99)
Potassium: 4.1 mmol/L (ref 3.5–5.1)
Sodium: 140 mmol/L (ref 135–145)
Total Bilirubin: 0.9 mg/dL (ref 0.3–1.2)
Total Protein: 6.3 g/dL — ABNORMAL LOW (ref 6.5–8.1)

## 2021-01-18 LAB — TROPONIN I (HIGH SENSITIVITY)
Troponin I (High Sensitivity): 13 ng/L (ref <18)
Troponin I (High Sensitivity): 17 ng/L (ref <18)

## 2021-01-18 LAB — CBC
HCT: 53.1 % — ABNORMAL HIGH (ref 39.0–52.0)
Hemoglobin: 16.9 g/dL (ref 13.0–17.0)
MCH: 30 pg (ref 26.0–34.0)
MCHC: 31.8 g/dL (ref 30.0–36.0)
MCV: 94.3 fL (ref 80.0–100.0)
Platelets: 152 10*3/uL (ref 150–400)
RBC: 5.63 MIL/uL (ref 4.22–5.81)
RDW: 13.7 % (ref 11.5–15.5)
WBC: 8.6 10*3/uL (ref 4.0–10.5)
nRBC: 0 % (ref 0.0–0.2)

## 2021-01-18 LAB — ECHOCARDIOGRAM COMPLETE
Area-P 1/2: 4.1 cm2
Calc EF: 41 %
S' Lateral: 4.9 cm
Single Plane A2C EF: 46.4 %
Single Plane A4C EF: 36.3 %

## 2021-01-18 LAB — PROTIME-INR
INR: 1.1 (ref 0.8–1.2)
Prothrombin Time: 14.2 seconds (ref 11.4–15.2)

## 2021-01-18 LAB — APTT: aPTT: 29 seconds (ref 24–36)

## 2021-01-18 LAB — RESP PANEL BY RT-PCR (FLU A&B, COVID) ARPGX2
Influenza A by PCR: NEGATIVE
Influenza B by PCR: NEGATIVE
SARS Coronavirus 2 by RT PCR: NEGATIVE

## 2021-01-18 LAB — BRAIN NATRIURETIC PEPTIDE: B Natriuretic Peptide: 169.7 pg/mL — ABNORMAL HIGH (ref 0.0–100.0)

## 2021-01-18 LAB — MAGNESIUM: Magnesium: 2.1 mg/dL (ref 1.7–2.4)

## 2021-01-18 LAB — TSH: TSH: 0.404 u[IU]/mL (ref 0.350–4.500)

## 2021-01-18 MED ORDER — INSULIN ASPART 100 UNIT/ML IJ SOLN
0.0000 [IU] | Freq: Three times a day (TID) | INTRAMUSCULAR | Status: DC
  Administered 2021-01-18: 2 [IU] via SUBCUTANEOUS
  Administered 2021-01-18: 3 [IU] via SUBCUTANEOUS
  Administered 2021-01-18 – 2021-01-19 (×2): 2 [IU] via SUBCUTANEOUS
  Administered 2021-01-19: 3 [IU] via SUBCUTANEOUS
  Administered 2021-01-21: 2 [IU] via SUBCUTANEOUS

## 2021-01-18 MED ORDER — ACETAMINOPHEN 500 MG PO TABS: 500.0000 mg | ORAL_TABLET | Freq: Four times a day (QID) | ORAL | Status: DC | PRN

## 2021-01-18 MED ORDER — ALBUTEROL SULFATE HFA 108 (90 BASE) MCG/ACT IN AERS
2.0000 | INHALATION_SPRAY | Freq: Four times a day (QID) | RESPIRATORY_TRACT | Status: DC | PRN
  Filled 2021-01-18: qty 6.7

## 2021-01-18 MED ORDER — SODIUM CHLORIDE 0.9 % IV SOLN: 250.0000 mL | INTRAVENOUS | Status: DC | PRN

## 2021-01-18 MED ORDER — SODIUM CHLORIDE 0.9% FLUSH
3.0000 mL | Freq: Two times a day (BID) | INTRAVENOUS | Status: DC
  Administered 2021-01-18 – 2021-01-19 (×2): 3 mL via INTRAVENOUS

## 2021-01-18 MED ORDER — ENOXAPARIN SODIUM 40 MG/0.4ML IJ SOSY: 40.0000 mg | PREFILLED_SYRINGE | INTRAMUSCULAR | Status: DC

## 2021-01-18 MED ORDER — ACETAMINOPHEN 325 MG PO TABS: 650.0000 mg | ORAL_TABLET | ORAL | Status: DC | PRN

## 2021-01-18 MED ORDER — BUDESONIDE 0.5 MG/2ML IN SUSP
0.5000 mg | Freq: Two times a day (BID) | RESPIRATORY_TRACT | Status: DC
  Administered 2021-01-18 – 2021-01-21 (×7): 0.5 mg via RESPIRATORY_TRACT
  Filled 2021-01-18 (×7): qty 2

## 2021-01-18 MED ORDER — LIDOCAINE IN D5W 4-5 MG/ML-% IV SOLN
1.0000 mg/min | INTRAVENOUS | Status: AC
  Administered 2021-01-18: 1 mg/min via INTRAVENOUS
  Filled 2021-01-18: qty 500

## 2021-01-18 MED ORDER — SODIUM CHLORIDE 0.9% FLUSH: 3.0000 mL | INTRAVENOUS | Status: DC | PRN

## 2021-01-18 MED ORDER — NITROGLYCERIN 0.4 MG SL SUBL: 0.4000 mg | SUBLINGUAL_TABLET | SUBLINGUAL | Status: DC | PRN

## 2021-01-18 MED ORDER — METOPROLOL SUCCINATE ER 100 MG PO TB24
100.0000 mg | ORAL_TABLET | Freq: Every day | ORAL | Status: DC
  Administered 2021-01-18 – 2021-01-21 (×4): 100 mg via ORAL
  Filled 2021-01-18: qty 4
  Filled 2021-01-18 (×3): qty 1

## 2021-01-18 MED ORDER — ROSUVASTATIN CALCIUM 20 MG PO TABS
20.0000 mg | ORAL_TABLET | Freq: Every day | ORAL | Status: DC
  Administered 2021-01-18 – 2021-01-21 (×4): 20 mg via ORAL
  Filled 2021-01-18 (×4): qty 1

## 2021-01-18 MED ORDER — INSULIN DETEMIR 100 UNIT/ML ~~LOC~~ SOLN
35.0000 [IU] | Freq: Every day | SUBCUTANEOUS | Status: DC
  Administered 2021-01-18 – 2021-01-20 (×3): 35 [IU] via SUBCUTANEOUS
  Filled 2021-01-18 (×6): qty 0.35

## 2021-01-18 MED ORDER — APIXABAN 5 MG PO TABS
5.0000 mg | ORAL_TABLET | Freq: Two times a day (BID) | ORAL | Status: DC
  Administered 2021-01-18 (×2): 5 mg via ORAL
  Filled 2021-01-18 (×2): qty 1

## 2021-01-18 MED ORDER — SOTALOL HCL 80 MG PO TABS
160.0000 mg | ORAL_TABLET | Freq: Two times a day (BID) | ORAL | Status: DC
  Administered 2021-01-18: 160 mg via ORAL
  Filled 2021-01-18 (×4): qty 2

## 2021-01-18 MED ORDER — MONTELUKAST SODIUM 10 MG PO TABS
10.0000 mg | ORAL_TABLET | Freq: Every day | ORAL | Status: DC
  Administered 2021-01-18 – 2021-01-20 (×3): 10 mg via ORAL
  Filled 2021-01-18 (×3): qty 1

## 2021-01-18 NOTE — ED Notes (Signed)
Lab to add Mag

## 2021-01-18 NOTE — ED Notes (Signed)
Returned from testing.

## 2021-01-18 NOTE — H&P (Signed)
Cardiology Admission History and Physical:   Patient ID: John [name] MRN: [id number]; DOB: 07/16/1950   Admission date: 01/17/2021  PCP:  [name], MD   [facility] Providers Cardiologist:  Shelva Majestic, MD  Electrophysiologist:  Constance Haw, MD       Chief Complaint:  Palpitations  Patient Profile:   John [name] is a 70 y.o. male with a PMH of extensive multivessel CAD s/p multiple stents, ICM (EF = 45-50%), HTN, HLD, A Flutter (on Apixaban), VT s/p secondary prevention MDT ICD, COPD, ILD w/ lung nodules, prior tobacco abuse, OSA on CPAP, and GERD  who is being seen 01/18/2021 for the evaluation of palpitations.   History of Present Illness:   John [name] is a 70 y.o. male with a PMH of extensive multivessel CAD s/p multiple stents, ICM (EF = 45-50%), HTN, HLD, A Flutter (on Apixaban), VT s/p secondary prevention MDT ICD, COPD, ILD w/ lung nodules, prior tobacco abuse, OSA on CPAP, and GERD  who is being seen 01/18/2021 for the evaluation of palpitations.  The following history was obtained from the patient.  The patient reports that at 5:45 pm yesterday he developed sudden onset palpitations and tachycardia. He has a watch that monitors his HR and it demonstrated that his HR was 155-160. This lasted for ~1 hr and 20 minutes before subsiding. The episode was associated with dizziness, presyncope, SOB and left sided sharp chest pain. These associated symptoms resolved with resolution of his tachycardia. At around 7:15 pm the tachycardia recurred. He called his doctor who instructed him to go to the ED for evaluation.  Of note the patient was admitted to cardiology for VT in 3/22. During that hospitalization he was found to be in sustained monomorphic VT requiring sedation and 3 shocks. A LHC showed stable non-obstructive CAD. He underwent DC MDT placement and his home sotalol was increased to 160 mg BID. Since this admission, he has not had any symptoms until  now.   He denies fevers, chills, PND, orthopnea, syncope, abdominal pain, weakness, numbness, urinary symptoms, or peripheral edema.  In the ED his VS were afebrile, HR 80, BP 112/82, RR 20 and satting 96% on RA. Troponins ruled out. BNP stable at 169.7. CXR unremarkable and EKG showing PVCs without acute ischemic changes. Cardiology was consulted for admission.    Past Medical History:  Diagnosis Date   Atrial flutter ([place])    s/p cardioversion   Coronary artery disease    Diabetes mellitus    GERD (gastroesophageal reflux disease)    History of nuclear stress test 04/04/2011   lexiscan; mod-large in size fixed inferolateral defect (scar); non-diagnostic for ischemia; low risk scan    Hyperlipidemia    Hypertension    Left foot drop    r/t past disk srugery - uses Kevlar brace   Myocardial infarction ([place])    posterior MI   Shortness of breath    Sleep apnea    on CPAP; 04/28/2007 split-night - AHI during total sleep 44.43/hr and REM 72.56/hr    Past Surgical History:  Procedure Laterality Date   [place]  2010   6 stents total   CARDIAC CATHETERIZATION  01/2000   percutaneous transluminal coronary balloon angioplasty of mid RCA stenotic lesion   CARDIAC CATHETERIZATION  06/2006   no stenting; ischemic cardiomyopathy, EF 40-45%   CARDIOVERSION N/A 07/28/2016   Procedure: CARDIOVERSION;  Surgeon: Troy Sine, MD;  Location: [facility]  ENDOSCOPY;  Service: Cardiovascular;  Laterality: N/A;   CORONARY ANGIOPLASTY  09/1998   mid-distal RCA balloon dilatation, 4.5 & 5.0 stents    CORONARY ANGIOPLASTY WITH STENT PLACEMENT  03/1994   angioplasty & stenting (non-DES) of circumflex/prox ramus intermedius   CORONARY ANGIOPLASTY WITH STENT PLACEMENT  10/1994   large iliac [id number] stent to RCA   CORONARY ANGIOPLASTY WITH STENT PLACEMENT  12/2002   4.35mm stents x2 of RCA   CORONARY ANGIOPLASTY WITH STENT PLACEMENT  01/2005   cutting balloon arthrectomy of  distal RCA & Cypher DES 3.5x13; cutting balloon arthrectomy of mid RCA with Cypher DES 3.5x18   CORONARY ANGIOPLASTY WITH STENT PLACEMENT  11/2008   stenting of mid RCA with 4.0x30mm driver, non-DES   ICD IMPLANT N/A 08/20/2020   Procedure: ICD IMPLANT;  Surgeon: Constance Haw, MD;  Location: [place] [facility] LAB;  Service: Cardiovascular;  Laterality: N/A;   INTRAVASCULAR PRESSURE WIRE/FFR STUDY N/A 03/02/2020   Procedure: INTRAVASCULAR PRESSURE WIRE/FFR STUDY;  Surgeon: Leonie Man, MD;  Location: [place] [facility];  Service: Cardiovascular;  Laterality: N/A;   LEFT HEART CATH AND CORONARY ANGIOGRAPHY N/A 03/02/2020   Procedure: LEFT HEART CATH AND CORONARY ANGIOGRAPHY;  Surgeon: Leonie Man, MD;  Location: [place] [facility];  Service: Cardiovascular;  Laterality: N/A;   LEFT HEART CATH AND CORONARY ANGIOGRAPHY N/A 08/19/2020   Procedure: LEFT HEART CATH AND CORONARY ANGIOGRAPHY;  Surgeon: Lorretta Harp, MD;  Location: [place] [facility];  Service: Cardiovascular;  Laterality: N/A;   LEFT HEART CATHETERIZATION WITH CORONARY ANGIOGRAM N/A 02/27/2012   Procedure: LEFT HEART CATHETERIZATION WITH CORONARY ANGIOGRAM;  Surgeon: Lorretta Harp, MD;  Location: [facility] CATH LAB;  Service: Cardiovascular;  Laterality: N/A;   TRANSTHORACIC ECHOCARDIOGRAM  07/29/2010   EF 50=55%, mod inf wall hypokinesis & mild post wall hypokinesis; LA mild-mod dilated; mild mitral annular calcif & mild MR; mild TR & elevated RV systolic pressure; AV mildly sclerotic; mild aortic root dilatation      Medications Prior to Admission: Prior to Admission medications   Medication Sig Start Date End Date Taking? Authorizing Provider  acetaminophen (TYLENOL) 500 MG tablet Take 500 mg by mouth every 6 (six) hours as needed for moderate pain or headache.    [provider]  albuterol (VENTOLIN HFA) 108 (90 Base) MCG/ACT inhaler Inhale 2 puffs into the lungs every 6 (six) hours as needed for wheezing or shortness  of breath. 06/14/20   Icard, Bradley L, DO  ELIQUIS 5 MG TABS tablet TAKE 1 TABLET BY MOUTH TWICE A DAY Patient taking differently: Take 5 mg by mouth 2 (two) times daily. 10/13/20   Camnitz, Will Hassell Done, MD  ENTRESTO 24-26 MG TAKE 1 TABLET BY MOUTH TWICE A DAY Patient taking differently: Take 1 tablet by mouth 2 (two) times daily. 10/08/20   Troy Sine, MD  Fluticasone Furoate (ARNUITY ELLIPTA) 200 MCG/ACT AEPB Inhale 1 puff into the lungs daily. 12/29/20   Icard, Leory Plowman L, DO  glucose blood (ONETOUCH ULTRA) test strip USE TO CHECK BLOOD SUGAR 3 TIMES A DAY (E11.9) 12/09/20   [name], MD  insulin aspart (NOVOLOG FLEXPEN) 100 UNIT/ML FlexPen INJECT 5-20 UNITS SUBCUTANEOUSLY WITH LUNCH AND DINNER Patient taking differently: Inject 5-20 Units into the skin See admin instructions. With lunch and dinner 06/16/20   [name], MD  Insulin Pen Needle (NOVOFINE) 32G X 6 MM MISC 1 each by Other route daily. 08/19/19   [name],  MD  JARDIANCE 25 MG TABS tablet TAKE 1 TABLET BY MOUTH EVERY DAY Patient taking differently: Take 25 mg by mouth daily. 11/09/20   [name], MD  LEVEMIR FLEXTOUCH 100 UNIT/ML FlexPen INJECT 46 UNITS INTO THE SKIN DAILY. DX:E11.9 01/11/21   [name], MD  metoprolol succinate (TOPROL-XL) 100 MG 24 hr tablet Take 1 tablet (100 mg total) by mouth daily. Take with or immediately following a meal. 11/26/20   Camnitz, Will Hassell Done, MD  montelukast (SINGULAIR) 10 MG tablet TAKE 1 TABLET BY MOUTH EVERY DAY Patient taking differently: Take 10 mg by mouth at bedtime. 08/19/20   [name], MD  nitroGLYCERIN (NITROSTAT) 0.4 MG SL tablet PLACE 1 TABLET (0.4 MG TOTAL) UNDER THE TONGUE EVERY 5 (FIVE) MINUTES AS NEEDED FOR CHEST PAIN. 07/27/20   Troy Sine, MD  rosuvastatin (CRESTOR) 20 MG tablet TAKE 1 TABLET BY MOUTH EVERY DAY Patient taking differently: Take 20 mg by mouth daily. 06/21/20   Troy Sine, MD  sotalol (BETAPACE) 160 MG tablet Take  1 tablet (160 mg total) by mouth 2 (two) times daily. 12/28/20   Lajean Saver, MD     Allergies:    Allergies  Allergen Reactions   Benazepril Other (See Comments)    hyperkalemia   Fish Allergy Itching   Omega-3 Fatty Acids Hives and Itching    Social History:   Social History   Socioeconomic History   Marital status: Married    Spouse name: Not on file   Number of children: 3   Years of education: Not on file   Highest education level: Not on file  Occupational History   Occupation: Best boy: [employer]    Comment: [place], [place]. [place]  Tobacco Use   Smoking status: Former    Packs/day: 1.00    Years: 50.00    Pack years: 50.00    Types: Cigarettes    Quit date: 07/20/2016    Years since quitting: 4.5   Smokeless tobacco: Never  Substance and Sexual Activity   Alcohol use: Not Currently    Alcohol/week: 0.0 standard drinks   Drug use: No   Sexual activity: Yes  Other Topics Concern   Not on file  Social History Narrative   Not on file   Social Determinants of Health   Financial Resource Strain: Not on file  Food Insecurity: Not on file  Transportation Needs: Not on file  Physical Activity: Not on file  Stress: Not on file  Social Connections: Not on file  Intimate Partner Violence: Not on file    Family History:   The patient's family history includes Heart attack in his father.    ROS:  Please see the history of present illness.  All other ROS reviewed and negative.     Physical Exam/Data:   Vitals:   01/18/21 0200 01/18/21 0300 01/18/21 0330 01/18/21 0400  BP: 99/74 102/76 95/77 109/73  Pulse: 70 63 69 71  Resp: 19 19 18 19   Temp:      SpO2: 94% 96% 96% 96%   No intake or output data in the 24 hours ending 01/18/21 0451 Last 3 Weights 01/06/2021 12/28/2020 12/10/2020  Weight (lbs) 216 lb 3.2 oz 221 lb 218 lb 3.2 oz  Weight (kg) 98.068 kg 100.245 kg 98.975 kg     There is no height or weight on file to calculate BMI.  General:   Well nourished, well developed, in no acute  distress, pleasant HEENT: OP clear, MMM, EOMI Neck: no JVD Vascular: 2+ radial pulses Cardiac:  normal S1, S2; RRR; soft I/VI systolic murmur at the apex, no rubs or gallops Lungs:  clear to auscultation bilaterally, no wheezing, rhonchi or rales  Abd: soft, nontender, no hepatomegaly  Ext: 1+ peripheral edema Musculoskeletal:  No deformities, BUE and BLE strength grossly normal and equal Skin: warm and dry  Neuro:  CNs 2-12 intact, no focal abnormalities noted Psych:  Normal affect    EKG:  The ECG that was done was personally reviewed and demonstrates NSR with frequent PVCs.  Device Interrogation 01/18/21: He had 9 episodes of VT, most sustained VT on 01/17/21. Each episode appeared to be precipitated by a PVC. Each episode broke spontaneously without shock or ATP. The longest cycle length for the VT was 370. He has a VT treatment zone of 182 bpm and a VF zone of 222 bpm. He had a few other episodes of VT in July as well.   Relevant CV Studies:  LHC 08/19/20:   Prox RCA to Dist RCA lesion is 15% stenosed. Ost RCA to Prox RCA lesion is 40% stenosed. Ramus lesion is 60% stenosed. Dist RCA lesion is 45% stenosed.  TTE 03/01/20:  1. Left ventricular ejection fraction, by estimation, is 45 to 50%. The  left ventricle has mildly decreased function. The left ventricle  demonstrates global hypokinesis. Left ventricular diastolic parameters are  indeterminate.   2. Right ventricular systolic function is normal. The right ventricular  size is normal.   3. Left atrial size was severely dilated.   4. The mitral valve is normal in structure. Trivial mitral valve  regurgitation. No evidence of mitral stenosis.   5. The aortic valve is tricuspid. Aortic valve regurgitation is not  visualized. No aortic stenosis is present.   6. Aortic dilatation noted. There is mild dilatation of the aortic root,  measuring 41 mm.   7. The inferior vena cava is  dilated in size with >50% respiratory  variability, suggesting right atrial pressure of 8 mmHg.    Laboratory Data:  High Sensitivity Troponin:   Recent Labs  Lab 12/28/20 0142 12/28/20 0513 01/17/21 2233 01/18/21 0027  TROPONINIHS 17 25* 13 17      Chemistry Recent Labs  Lab 01/17/21 2233  NA 140  K 4.1  CL 111  CO2 20*  GLUCOSE 186*  BUN 19  CREATININE 0.98  CALCIUM 9.5  GFRNONAA >60  ANIONGAP 9    Recent Labs  Lab 01/17/21 2233  PROT 6.3*  ALBUMIN 3.6  AST 16  ALT 20  ALKPHOS 69  BILITOT 0.9   Hematology Recent Labs  Lab 01/17/21 2233  WBC 9.4  RBC 5.86*  HGB 17.4*  HCT 55.6*  MCV 94.9  MCH 29.7  MCHC 31.3  RDW 13.5  PLT 163   BNP Recent Labs  Lab 01/17/21 2233  BNP 169.7*    DDimer No results for input(s): DDIMER in the last 168 hours.   Radiology/Studies:  DG Chest 2 View  Result Date: 01/17/2021 CLINICAL DATA:  Tachycardia, hypotension, palpitations EXAM: CHEST - 2 VIEW COMPARISON:  12/28/2020 FINDINGS: Frontal and lateral views of the chest demonstrates stable dual lead pacemaker. Cardiac silhouette is unremarkable. There is central vascular congestion without acute airspace disease, effusion, or pneumothorax. A 2 cm nodule projects over the left posterior sixth rib. No acute bony abnormalities. IMPRESSION: 1. 2 cm nodule within the mid left hemithorax. Follow-up chest CT recommended for further  characterization. 2. Central vascular congestion without overt edema. Electronically Signed   By: Randa Ngo M.D.   On: 01/17/2021 22:48     Assessment and Plan:   John [name] is a 70 y.o. male with a PMH of extensive multivessel CAD s/p multiple stents, ICM (EF = 45-50%), HTN, HLD, A Flutter (on Apixaban), VT s/p secondary prevention MDT ICD, COPD, ILD w/ lung nodules, prior tobacco abuse, OSA on CPAP, and GERD  who is being admitted for management of monomorhpic VT.  #Monomorphic VT ::Patient presenting with palpitations and a WCT  consistent with VT as proven by ICD interrogation. This is likely a scar mediated VT from his ICM. He has been adherent to his sotalol and other medications. No clear reversible cause for his VT. Unlikely to be new blockages in his coronaries given his absence of concerning symptoms for new obstructive CAD. Treatment options for this patient include amiodarone vs ablation. He has a history of ILD (the details of which are still unclear to me) which makes amiodarone a not ideal choice given the risk for lung injury. Ablation may be a better option but will have to defer to EP. For now, will suppress his VT with a lidocaine infusion. That way, if an ablation is pursued, lidocaine won't inhibit provocation of a VT focus like amiodarone and procainamide would. -Start lidocaine gtt @1  -Daily lidocaine levels -cont sotalol 160 mg BID -cont metoprolol succinate -Order TTE -Consult EP for management -Consider cMRI to quantify scar burden if ablation is pursued -TSH  #CAD #HLD ::Troponins ruled out. NO active chest pain. -Continue apixaban 5 mg BID -continue crestor 20 mg daily -continue metoprolol as above  #Atrial Flutter -continue apixaban  #ICM #HTN -continue metoprolol  #DMII ::On jardiance, levemir 46 U and lispo SSI at home. -hold jardiance -start levemir 35 U QHS to start + SSI -BG checks TID-AC QHS  #OSA -CPAP   Risk Assessment/Risk Scores:           Severity of Illness: The appropriate patient status for this patient is INPATIENT. Inpatient status is judged to be reasonable and necessary in order to provide the required intensity of service to ensure the patient's safety. The patient's presenting symptoms, physical exam findings, and initial radiographic and laboratory data in the context of their chronic comorbidities is felt to place them at high risk for further clinical deterioration. Furthermore, it is not anticipated that the patient will be medically stable for  discharge from the hospital within 2 midnights of admission. The following factors support the patient status of inpatient.   " The patient's presenting symptoms include tachycardia, presyncope. " The worrisome physical exam findings include wide complex tachycardia. " The initial radiographic and laboratory data are worrisome because of N/A. " The chronic co-morbidities include CAD, HTN, HLD, ICM, ILD, COPD, TAA.   * I certify that at the point of admission it is my clinical judgment that the patient will require inpatient hospital care spanning beyond 2 midnights from the point of admission due to high intensity of service, high risk for further deterioration and high frequency of surveillance required.*   For questions or updates, please contact [facility] Please consult [email or web address] for contact info under     Signed, Hershal Coria, MD  01/18/2021 4:51 AM

## 2021-01-18 NOTE — Consult Note (Addendum)
Cardiology Consultation:   Patient ID: John [name] MRN: [id number]; DOB: 06/16/1951  Admit date: 01/17/2021 Date of Consult: 01/18/2021  PCP:  [name], MD   [facility] Providers Cardiologist:  Shelva Majestic, MD  Electrophysiologist:  Constance Haw, MD  {     Patient Profile:   John [name] is a 70 y.o. male with a hx of  CAD(multiple prior PCIs reported in the RCA and Cx, last stenting in RCA 2010), ICM, HTN, HLD, DM, OSA (w/CPAP), TAA, ILD, quit smoking 2019, AFlutter, back surgery resulting in L foot drop, chronic back pain who is being seen 01/18/2021 for the evaluation of recurrent VT at the request of Dr. Gardiner Rhyme.   Device information MDT dual chamber ICD implanted 08/20/20 Secondary prevention w/hx of hemodynamically unstable MMVT   AAD hx 2018 Pulmonary function studies have shown restriction with low DLCO.  He felt possibly also to have asthma based on nitric oxide testing.  With his lung disease it has been recommended that amiodarone be considered for discontinuance Looks like amio was stopped in 2019 Follows with pulmonology March 2022 mexilletine > recurrent VT higher doses were too expensive June 2022 sotalol started  History of Present Illness:   John [name] history noted for having been  admitted to [facility] (via [facility]) 08/18/20 with CP, palpitations, weakness, and SOB, all progressive for a day of so increased palpitations, found in [facility] felt to be c/w VT , hypotensive and cardioverted in the ER. (1020J, 200J, 200J),given amio bolus and gtt as well as lidocaine bolus, note report that the amiodarone was stopped 2/2 bradycardia He underwent cath with no new or obstructive CAD Initially consulted by Dr. Quentin Ore, [facility] c/w VT and recommended ICD implant, (suspect to be scar mediated) Dr. Curt Bears implanted his device 08/20/20 Discharged 08/21/20   He saw Dr. Quentin Ore 08/31/20, was feeling well, had VT treated with ATP that did not immediately terminate the  tachycardia Toprol was increased, 100mg  AM and 50mg  PM Thoughts towards AAD if more arrhythmias.   Device clinic noted alert for VT/ATP x2 on 09/19/20, in d/w Dr. Curt Bears, betablocker again titrated upwards  11/26/20 device check noted more VT again, all slight below detection rate in his treatment zone Noted then as well that a higher dose of mexiletine was cost prohibitive His mexiletine was stopped, started on Sotalol 40mg  BID and his toprol reduced (2/2 fatigue)  12/01/20 seen in the ER, where he was noted to have WCT suspect to be slow VT at 147bpm and externally cardioverted, device confirmed VT  57 monitored VT episodes VT today 146-168bpm Monitor zone 150-182 VT 182-222 VF 222 That day's episode was declared by device as terminated with what looks like an ongoing VT though slower, a different morphology,  there is 1:1 as well sotalol was titrated and discharged from the ER given no syncope No device programming changes were made  12/28/20: ER visit with palpitations, again noted slow VT again his sotalol up titrated, dr. Curt Bears discussed at this visit next steps would be amiodarone vs ablation.  Discharged from the ER  THIS ADMISSION The patient had called the service last evening with on/off palpitations that were making him feel poorly and his BP low, no syncope.  At the time of his call, was not actively having palpitations and advised if recurrent to go to the ER. He had not had further though given they happened a couple times yesterday he decided to come in and get checked. His device  interrogation confirmed VT not in therapy zones and was decided to admit him His sotalol continued Lidocaine gtt started  LABS K+ 4.1 > 4.1 Mag 2.1 BUN/Creat 19/0.99 HS Trop 13, 17 WBC 8.6 H/?H 16/53 Plts 152   The patient feels well currently. Mentions that he will feels a slight fluttering in his chest, generally makes him feel a bit winded and weak.  Not near syncopal, no  syncope. He will sometimes feel momentary a strong sense of sudden dizziness.  Outside of these times he feels well, no CP, no changes in his exertional capacity.  A few days ago he had a sudden loss of vision in his L eye, this lasted a few seconds, he sat down and it resolved This was not associated with any other symptoms, weakness, numbness or headache. It has not happened again. He denies any headache currently though sometimes has a fullness type headache historically  He has not missed any medicines, he has not had any AFib/flutter   Past Medical History:  Diagnosis Date   Atrial flutter (HCC)    s/p cardioversion   Coronary artery disease    Diabetes mellitus    GERD (gastroesophageal reflux disease)    History of nuclear stress test 04/04/2011   lexiscan; mod-large in size fixed inferolateral defect (scar); non-diagnostic for ischemia; low risk scan    Hyperlipidemia    Hypertension    Left foot drop    r/t past disk srugery - uses Kevlar brace   Myocardial infarction ([place])    posterior MI   Shortness of breath    Sleep apnea    on CPAP; 04/28/2007 split-night - AHI during total sleep 44.43/hr and REM 72.56/hr    Past Surgical History:  Procedure Laterality Date   [place]  2010   6 stents total   CARDIAC CATHETERIZATION  01/2000   percutaneous transluminal coronary balloon angioplasty of mid RCA stenotic lesion   CARDIAC CATHETERIZATION  06/2006   no stenting; ischemic cardiomyopathy, EF 40-45%   CARDIOVERSION N/A 07/28/2016   Procedure: CARDIOVERSION;  Surgeon: Troy Sine, MD;  Location: [place];  Service: Cardiovascular;  Laterality: N/A;   CORONARY ANGIOPLASTY  09/1998   mid-distal RCA balloon dilatation, 4.5 & 5.0 stents    CORONARY ANGIOPLASTY WITH STENT PLACEMENT  03/1994   angioplasty & stenting (non-DES) of circumflex/prox ramus intermedius   CORONARY ANGIOPLASTY WITH STENT PLACEMENT  10/1994   large iliac [id number]  stent to RCA   [place]  12/2002   4.57mm stents x2 of RCA   CORONARY ANGIOPLASTY WITH STENT PLACEMENT  01/2005   cutting balloon arthrectomy of distal RCA & Cypher DES 3.5x13; cutting balloon arthrectomy of mid RCA with Cypher DES 3.5x18   CORONARY ANGIOPLASTY WITH STENT PLACEMENT  11/2008   stenting of mid RCA with 4.0x62mm driver, non-DES   ICD IMPLANT N/A 08/20/2020   Procedure: ICD IMPLANT;  Surgeon: Constance Haw, MD;  Location: [place] [facility];  Service: Cardiovascular;  Laterality: N/A;   INTRAVASCULAR PRESSURE WIRE/FFR STUDY N/A 03/02/2020   Procedure: INTRAVASCULAR PRESSURE WIRE/FFR STUDY;  Surgeon: Leonie Man, MD;  Location: [place] [facility];  Service: Cardiovascular;  Laterality: N/A;   LEFT HEART CATH AND CORONARY ANGIOGRAPHY N/A 03/02/2020   Procedure: LEFT HEART CATH AND CORONARY ANGIOGRAPHY;  Surgeon: Leonie Man, MD;  Location: [place] [facility];  Service: Cardiovascular;  Laterality: N/A;   LEFT HEART CATH AND  CORONARY ANGIOGRAPHY N/A 08/19/2020   Procedure: LEFT HEART CATH AND CORONARY ANGIOGRAPHY;  Surgeon: Lorretta Harp, MD;  Location: [place] [facility];  Service: Cardiovascular;  Laterality: N/A;   LEFT HEART CATHETERIZATION WITH CORONARY ANGIOGRAM N/A 02/27/2012   Procedure: LEFT HEART CATHETERIZATION WITH CORONARY ANGIOGRAM;  Surgeon: Lorretta Harp, MD;  Location: [facility] LAB;  Service: Cardiovascular;  Laterality: N/A;   TRANSTHORACIC ECHOCARDIOGRAM  07/29/2010   EF 50=55%, mod inf wall hypokinesis & mild post wall hypokinesis; LA mild-mod dilated; mild mitral annular calcif & mild MR; mild TR & elevated RV systolic pressure; AV mildly sclerotic; mild aortic root dilatation      Home Medications:  Prior to Admission medications   Medication Sig Start Date End Date Taking? Authorizing Provider  acetaminophen (TYLENOL) 500 MG tablet Take 500 mg by mouth every 6 (six) hours as needed for moderate pain or headache.    Yes [provider]  albuterol (VENTOLIN HFA) 108 (90 Base) MCG/ACT inhaler Inhale 2 puffs into the lungs every 6 (six) hours as needed for wheezing or shortness of breath. 06/14/20  Yes Icard, Bradley L, DO  ELIQUIS 5 MG TABS tablet TAKE 1 TABLET BY MOUTH TWICE A DAY Patient taking differently: Take 5 mg by mouth 2 (two) times daily. 10/13/20  Yes Camnitz, Will Hassell Done, MD  ENTRESTO 24-26 MG TAKE 1 TABLET BY MOUTH TWICE A DAY Patient taking differently: Take 1 tablet by mouth 2 (two) times daily. 10/08/20  Yes Troy Sine, MD  Fluticasone Furoate (ARNUITY ELLIPTA) 200 MCG/ACT AEPB Inhale 1 puff into the lungs daily. 12/29/20  Yes Icard, Bradley L, DO  glucose blood (ONETOUCH ULTRA) test strip USE TO CHECK BLOOD SUGAR 3 TIMES A DAY (E11.9) 12/09/20  Yes [name], MD  insulin aspart (NOVOLOG FLEXPEN) 100 UNIT/ML FlexPen INJECT 5-20 UNITS SUBCUTANEOUSLY WITH LUNCH AND DINNER Patient taking differently: Inject 5-20 Units into the skin See admin instructions. With lunch and dinner 06/16/20  Yes [name], MD  Insulin Pen Needle (NOVOFINE) 32G X 6 MM MISC 1 each by Other route daily. 08/19/19  Yes PickardCammie Mcgee, MD  JARDIANCE 25 MG TABS tablet TAKE 1 TABLET BY MOUTH EVERY DAY Patient taking differently: Take 25 mg by mouth daily. 11/09/20  Yes Pickard, [name], MD  LEVEMIR FLEXTOUCH 100 UNIT/ML FlexPen INJECT 46 UNITS INTO THE SKIN DAILY. DX:E11.9 01/11/21  Yes [name], MD  metoprolol succinate (TOPROL-XL) 100 MG 24 hr tablet Take 1 tablet (100 mg total) by mouth daily. Take with or immediately following a meal. 11/26/20  Yes Camnitz, Will Hassell Done, MD  montelukast (SINGULAIR) 10 MG tablet TAKE 1 TABLET BY MOUTH EVERY DAY Patient taking differently: Take 10 mg by mouth at bedtime. 08/19/20  Yes [name], MD  nitroGLYCERIN (NITROSTAT) 0.4 MG SL tablet PLACE 1 TABLET (0.4 MG TOTAL) UNDER THE TONGUE EVERY 5 (FIVE) MINUTES AS NEEDED FOR CHEST PAIN. 07/27/20  Yes Troy Sine, MD  rosuvastatin (CRESTOR) 20 MG tablet TAKE 1 TABLET BY MOUTH EVERY DAY Patient taking differently: Take 20 mg by mouth daily. 06/21/20  Yes Troy Sine, MD  sotalol (BETAPACE) 160 MG tablet Take 1 tablet (160 mg total) by mouth 2 (two) times daily. 12/28/20  Yes Lajean Saver, MD    Inpatient Medications: Scheduled Meds:  apixaban  5 mg Oral BID   budesonide  0.5 mg Inhalation BID   insulin aspart  0-15 Units Subcutaneous TID WC  insulin detemir  35 Units Subcutaneous QHS   metoprolol succinate  100 mg Oral Daily   montelukast  10 mg Oral QHS   rosuvastatin  20 mg Oral Daily   sodium chloride flush  3 mL Intravenous Q12H   sotalol  160 mg Oral BID   Continuous Infusions:  sodium chloride     lidocaine 1 mg/min (01/18/21 0451)   PRN Meds: sodium chloride, acetaminophen, albuterol, nitroGLYCERIN, sodium chloride flush  Allergies:    Allergies  Allergen Reactions   Omega-3 Fatty Acids Hives and Itching   Benazepril Other (See Comments)    hyperkalemia   Fish Allergy Itching    Social History:   Social History   Socioeconomic History   Marital status: Married    Spouse name: Not on file   Number of children: 3   Years of education: Not on file   Highest education level: Not on file  Occupational History   Occupation: Best boy: [employer]    Comment: [place], [place]. [place]  Tobacco Use   Smoking status: Former    Packs/day: 1.00    Years: 50.00    Pack years: 50.00    Types: Cigarettes    Quit date: 07/20/2016    Years since quitting: 4.5   Smokeless tobacco: Never  Substance and Sexual Activity   Alcohol use: Not Currently    Alcohol/week: 0.0 standard drinks   Drug use: No   Sexual activity: Yes  Other Topics Concern   Not on file  Social History Narrative   Not on file   Social Determinants of Health   Financial Resource Strain: Not on file  Food Insecurity: Not on file  Transportation Needs: Not on file  Physical Activity:  Not on file  Stress: Not on file  Social Connections: Not on file  Intimate Partner Violence: Not on file    Family History:   Family History  Problem Relation Age of Onset   Heart attack Father      ROS:  Please see the history of present illness.  All other ROS reviewed and negative.     Physical Exam/Data:   Vitals:   01/18/21 0730 01/18/21 0800 01/18/21 0830 01/18/21 0900  BP: 104/84 110/77 125/82 110/85  Pulse: 69 70 69 70  Resp: 16 14 17 17   Temp:      TempSrc:      SpO2: 96% 96% 97% 95%   No intake or output data in the 24 hours ending 01/18/21 0932 Last 3 Weights 01/06/2021 12/28/2020 12/10/2020  Weight (lbs) 216 lb 3.2 oz 221 lb 218 lb 3.2 oz  Weight (kg) 98.068 kg 100.245 kg 98.975 kg     There is no height or weight on file to calculate BMI.  General:  Well nourished, well developed, in no acute distress HEENT: normal Lymph: no adenopathy Neck: no JVD Endocrine:  No thryomegaly Vascular: No carotid bruits; FA pulses 2+ bilaterally without bruits  Cardiac:  RRR; no murmurs, gallops or rubs Lungs:  CTA b/l, no wheezing, rhonchi or rales  Abd: soft, nontender Ext: no edema Musculoskeletal:  No deformities Skin: warm and dry  Neuro:  no focal abnormalities noted Psych:  Normal affect   EKG:  The EKG was personally reviewed and demonstrates:    AP/VS 75, PVCs , no ischemic looking changes  Telemetry:  Telemetry was personally reviewed and demonstrates:   SR 60's, occ AV pacing, occ PVCs, less frequently couplets No VT  Relevant CV Studies:  08/19/20: LHC Prox RCA to Dist RCA lesion is 15% stenosed. Ost RCA to Prox RCA lesion is 40% stenosed. Ramus lesion is 60% stenosed. Dist RCA lesion is 45% stenosed. Mr. Rinehart has known disease and underwent diagnostic coronary angiography by Dr. Ellyn Hack in September 2021.  He had noncritical CAD with moderate proximal RCA and ramus branch stenosis both of which were shown to be not physiologically significant by  DFR.  He presented with chest pain and monomorphic VT at 220 bpm.  His enzymes rose mildly suspect related to demand ischemia.  His anatomy is unchanged from his previous cath.  His EKG on presentation suggested "scar VT".  And EP evaluation would be warranted.  Continue medical therapy is recommended.  The sheath was removed and a TR band was placed on the right wrist to achieve patent hemostasis.  The patient did receive his Eliquis last dose yesterday morning.  Heparin will be restarted 4 hours after sheath removal without a bolus.  The patient left lab in stable condition. [name]. MD, [facility] 08/19/2020 9:04 AM   03/01/2020 TTE IMPRESSIONS  1. Left ventricular ejection fraction, by estimation, is 45 to 50%. The  left ventricle has mildly decreased function. The left ventricle  demonstrates global hypokinesis. Left ventricular diastolic parameters are  indeterminate.   2. Right ventricular systolic function is normal. The right ventricular  size is normal.   3. Left atrial size was severely dilated.   4. The mitral valve is normal in structure. Trivial mitral valve  regurgitation. No evidence of mitral stenosis.   5. The aortic valve is tricuspid. Aortic valve regurgitation is not  visualized. No aortic stenosis is present.   6. Aortic dilatation noted. There is mild dilatation of the aortic root,  measuring 41 mm.   7. The inferior vena cava is dilated in size with >50% respiratory  variability, suggesting right atrial pressure of 8 mmHg.      Laboratory Data:  High Sensitivity Troponin:   Recent Labs  Lab 12/28/20 0142 12/28/20 0513 01/17/21 2233 01/18/21 0027  TROPONINIHS 17 25* 13 17     Chemistry Recent Labs  Lab 01/17/21 2233 01/18/21 0432  NA 140 140  K 4.1 4.1  CL 111 110  CO2 20* 22  GLUCOSE 186* 129*  BUN 19 19  CREATININE 0.98 0.99  CALCIUM 9.5 9.0  GFRNONAA >60 >60  ANIONGAP 9 8    Recent Labs  Lab 01/17/21 2233  PROT 6.3*  ALBUMIN 3.6  AST 16   ALT 20  ALKPHOS 69  BILITOT 0.9   Hematology Recent Labs  Lab 01/17/21 2233 01/18/21 0432  WBC 9.4 8.6  RBC 5.86* 5.63  HGB 17.4* 16.9  HCT 55.6* 53.1*  MCV 94.9 94.3  MCH 29.7 30.0  MCHC 31.3 31.8  RDW 13.5 13.7  PLT 163 152   BNP Recent Labs  Lab 01/17/21 2233  BNP 169.7*    DDimer No results for input(s): DDIMER in the last 168 hours.   Radiology/Studies:  DG Chest 2 View  Result Date: 01/17/2021 CLINICAL DATA:  Tachycardia, hypotension, palpitations EXAM: CHEST - 2 VIEW COMPARISON:  12/28/2020 FINDINGS: Frontal and lateral views of the chest demonstrates stable dual lead pacemaker. Cardiac silhouette is unremarkable. There is central vascular congestion without acute airspace disease, effusion, or pneumothorax. A 2 cm nodule projects over the left posterior sixth rib. No acute bony abnormalities. IMPRESSION: 1. 2 cm nodule within the mid left hemithorax. Follow-up chest CT  recommended for further characterization. 2. Central vascular congestion without overt edema. Electronically Signed   By: Randa Ngo M.D.   On: 01/17/2021 22:48     Assessment and Plan:   VT Recurrent, symptomatic with SOB and weakness No syncope Well known for him Lytes look ok  Device interrogation notes stable battery and lead measurements 10 monitored VT episodes EGMs are c/w VT 160-170's 1st treatment rate starts 182bpm  I briefly d/w Dr. Curt Bears, well known to him Can consider amiodarone or ablation, would like Dr. Jackalyn Lombard thoughts as well Continue lidocaine gtt for now until Dr. Rayann Heman has had an opportunity to see the patient  We reviewed his last pulm note, with dx COPD/chronic bronchitis he felt amiodarone was a viable option.  2. CAD No anginal symptoms HS Trop unremarkable     Risk Assessment/Risk Scores:     For questions or updates, please contact [facility] Please consult [email or web address] for contact info under    Signed, Baldwin Jamaica, PA-C   01/18/2021 9:32 AM    I have seen, examined the patient, and reviewed the above assessment and plan.  Changes to above are made where necessary.  On exam, RRR.  The patient has ongoing issues with recurrent symptomatic VT.  He has failed medical therapy with mexiletine and sotalol.  He is reluctant to take amiodarone due to chronic lung disease.  I have spoken with Dr Curt Bears and we agree that VT ablation is next step.  Therapeutic strategies for ventricular tachycardia including medicine and ablation were discussed in detail with the patient today. Risk, benefits, and alternatives to EP study and radiofrequency ablation were also discussed in detail today. These risks include but are not limited to stroke, bleeding, vascular damage, tamponade, perforation, damage to the heart and other structures, AV block, ICD lead dislodgement, worsening renal function, and death. The patient understands these risk and wishes to proceed.  We will therefore proceed with catheter ablation at the next available time.  I will tentatively place on the schedule for tomorrow with Dr Curt Bears if schedule allows.  No driving x 6 months.   Co Sign: Thompson Grayer, MD 01/18/2021 1:40 PM

## 2021-01-18 NOTE — ED Notes (Signed)
Admitting provider at bedside.

## 2021-01-18 NOTE — ED Notes (Signed)
Pt was sleeping. I woke him up to give him pulmicort. Denies pain. Given water. Denies further needs. GCS 15. VSS.

## 2021-01-18 NOTE — Telephone Encounter (Signed)
Attempted to contact patient to schedule earlier appt, but patient was in the ED as of 04:05

## 2021-01-18 NOTE — ED Provider Notes (Signed)
[facility] EMERGENCY DEPARTMENT Provider Note   CSN: [id number] Arrival date & time: 01/17/21  2153     History Chief Complaint  Patient presents with   Near Syncope    John [name] is a 70 y.o. male.  The history is provided by the patient and medical records.  Near Syncope John [name] is a 70 y.o. male who presents to the Emergency Department complaining of palpitations.  He presents to the ED for evaluation of recurrent palpitations since 545pm this evening.  He is experiencing intermittent episodes of tachycardia to 155 with associated hypotension with BP 90s.  When he has the episodes he feels like his head is spinning and he is going to fall over. Each episode last 5 minutes to 2 hours at a time.  He gets associated CP/Back pain, sob.    No fever, cough, N/V/D, leg edema.    3 weeks ago he had a medication change.       Past Medical History:  Diagnosis Date   Atrial flutter ([place])    s/p cardioversion   Coronary artery disease    Diabetes mellitus    GERD (gastroesophageal reflux disease)    History of nuclear stress test 04/04/2011   lexiscan; mod-large in size fixed inferolateral defect (scar); non-diagnostic for ischemia; low risk scan    Hyperlipidemia    Hypertension    Left foot drop    r/t past disk srugery - uses Kevlar brace   Myocardial infarction ([place])    posterior MI   Shortness of breath    Sleep apnea    on CPAP; 04/28/2007 split-night - AHI during total sleep 44.43/hr and REM 72.56/hr    Patient Active Problem List   Diagnosis Date Noted   Coagulation defect ([place]) 12/22/2020   Non-ST elevation (NSTEMI) myocardial infarction [facility])    Ventricular tachycardia ([place]) 08/18/2020   Capsulitis 05/21/2020   Elevated troponin    Hyperglycemia due to diabetes mellitus ([place]) 02/29/2020   Thoracic aortic aneurysm ([place]) 02/29/2020   Hypotensive episode 02/29/2020   Obesity (BMI 30.0-34.9) 02/29/2020   Lung nodule 02/19/2020    Former smoker 02/19/2020   Healthcare maintenance 02/19/2020   Acoustic neuroma ([place]) 09/11/2017   Asymmetrical sensorineural hearing loss 09/11/2017   Encounter for cardioversion procedure    Anticoagulation adequate 06/06/2016   Fatigue 10/27/2015   Bradycardia 10/27/2015   Hyperlipidemia LDL goal <70 05/20/2015   OSA on CPAP 07/23/2013   Excessive daytime sleepiness 07/23/2013   Hypertension    Hyperlipidemia    Diabetes mellitus    Chest pain 08/18/2012   Tobacco abuse 08/18/2012   Unstable angina, cath [facility] 02/27/12 02/26/2012   CAD, multiple prior RCA PCI's. Last cath 2010, Myoview low risk Oct 2012 02/26/2012   DM type 2, goal HbA1c < 7% (HCC) 02/26/2012   HTN (hypertension) 02/26/2012   Dyslipidemia, low HDL. Intol to fish oil and Noacin 02/26/2012   Sleep apnea, on C-pap 02/26/2012   COPD (chronic obstructive pulmonary disease) ([place]) 02/26/2012   Smoking, quit one week ago 02/26/2012   GERD (gastroesophageal reflux disease) 02/26/2012    Past Surgical History:  Procedure Laterality Date   [place]  2010   6 stents total   CARDIAC CATHETERIZATION  01/2000   percutaneous transluminal coronary balloon angioplasty of mid RCA stenotic lesion   CARDIAC CATHETERIZATION  06/2006   no stenting; ischemic cardiomyopathy, EF 40-45%   CARDIOVERSION N/A 07/28/2016   Procedure:  CARDIOVERSION;  Surgeon: Troy Sine, MD;  Location: [place];  Service: Cardiovascular;  Laterality: N/A;   CORONARY ANGIOPLASTY  09/1998   mid-distal RCA balloon dilatation, 4.5 & 5.0 stents    CORONARY ANGIOPLASTY WITH STENT PLACEMENT  03/1994   angioplasty & stenting (non-DES) of circumflex/prox ramus intermedius   CORONARY ANGIOPLASTY WITH STENT PLACEMENT  10/1994   large iliac [id number] stent to RCA   [place]  12/2002   4.78mm stents x2 of RCA   CORONARY ANGIOPLASTY WITH STENT PLACEMENT  01/2005   cutting balloon arthrectomy of distal  RCA & Cypher DES 3.5x13; cutting balloon arthrectomy of mid RCA with Cypher DES 3.5x18   CORONARY ANGIOPLASTY WITH STENT PLACEMENT  11/2008   stenting of mid RCA with 4.0x56mm driver, non-DES   ICD IMPLANT N/A 08/20/2020   Procedure: ICD IMPLANT;  Surgeon: Constance Haw, MD;  Location: [place] CV LAB;  Service: Cardiovascular;  Laterality: N/A;   INTRAVASCULAR PRESSURE WIRE/FFR STUDY N/A 03/02/2020   Procedure: INTRAVASCULAR PRESSURE WIRE/FFR STUDY;  Surgeon: Leonie Man, MD;  Location: [place] CV LAB;  Service: Cardiovascular;  Laterality: N/A;   LEFT HEART CATH AND CORONARY ANGIOGRAPHY N/A 03/02/2020   Procedure: LEFT HEART CATH AND CORONARY ANGIOGRAPHY;  Surgeon: Leonie Man, MD;  Location: [place] CV LAB;  Service: Cardiovascular;  Laterality: N/A;   LEFT HEART CATH AND CORONARY ANGIOGRAPHY N/A 08/19/2020   Procedure: LEFT HEART CATH AND CORONARY ANGIOGRAPHY;  Surgeon: Lorretta Harp, MD;  Location: [place] CV LAB;  Service: Cardiovascular;  Laterality: N/A;   LEFT HEART CATHETERIZATION WITH CORONARY ANGIOGRAM N/A 02/27/2012   Procedure: LEFT HEART CATHETERIZATION WITH CORONARY ANGIOGRAM;  Surgeon: Lorretta Harp, MD;  Location: [facility] CATH LAB;  Service: Cardiovascular;  Laterality: N/A;   TRANSTHORACIC ECHOCARDIOGRAM  07/29/2010   EF 50=55%, mod inf wall hypokinesis & mild post wall hypokinesis; LA mild-mod dilated; mild mitral annular calcif & mild MR; mild TR & elevated RV systolic pressure; AV mildly sclerotic; mild aortic root dilatation        Family History  Problem Relation Age of Onset   Heart attack Father     Social History   Tobacco Use   Smoking status: Former    Packs/day: 1.00    Years: 50.00    Pack years: 50.00    Types: Cigarettes    Quit date: 07/20/2016    Years since quitting: 4.5   Smokeless tobacco: Never  Substance Use Topics   Alcohol use: Not Currently    Alcohol/week: 0.0 standard drinks   Drug use: No    Home  Medications Prior to Admission medications   Medication Sig Start Date End Date Taking? Authorizing Provider  acetaminophen (TYLENOL) 500 MG tablet Take 500 mg by mouth every 6 (six) hours as needed for moderate pain or headache.   Yes [provider]  albuterol (VENTOLIN HFA) 108 (90 Base) MCG/ACT inhaler Inhale 2 puffs into the lungs every 6 (six) hours as needed for wheezing or shortness of breath. 06/14/20  Yes Icard, Bradley L, DO  ELIQUIS 5 MG TABS tablet TAKE 1 TABLET BY MOUTH TWICE A DAY Patient taking differently: Take 5 mg by mouth 2 (two) times daily. 10/13/20  Yes Camnitz, Will Hassell Done, MD  ENTRESTO 24-26 MG TAKE 1 TABLET BY MOUTH TWICE A DAY Patient taking differently: Take 1 tablet by mouth 2 (two) times daily. 10/08/20  Yes Troy Sine, MD  Fluticasone Furoate (ARNUITY ELLIPTA)  200 MCG/ACT AEPB Inhale 1 puff into the lungs daily. 12/29/20  Yes Icard, Bradley L, DO  glucose blood (ONETOUCH ULTRA) test strip USE TO CHECK BLOOD SUGAR 3 TIMES A DAY (E11.9) 12/09/20  Yes [name], MD  insulin aspart (NOVOLOG FLEXPEN) 100 UNIT/ML FlexPen INJECT 5-20 UNITS SUBCUTANEOUSLY WITH LUNCH AND DINNER Patient taking differently: Inject 5-20 Units into the skin See admin instructions. With lunch and dinner 06/16/20  Yes [name], MD  Insulin Pen Needle (NOVOFINE) 32G X 6 MM MISC 1 each by Other route daily. 08/19/19  Yes PickardCammie Mcgee, MD  JARDIANCE 25 MG TABS tablet TAKE 1 TABLET BY MOUTH EVERY DAY Patient taking differently: Take 25 mg by mouth daily. 11/09/20  Yes Pickard, [name], MD  LEVEMIR FLEXTOUCH 100 UNIT/ML FlexPen INJECT 46 UNITS INTO THE SKIN DAILY. DX:E11.9 01/11/21  Yes [name], MD  metoprolol succinate (TOPROL-XL) 100 MG 24 hr tablet Take 1 tablet (100 mg total) by mouth daily. Take with or immediately following a meal. 11/26/20  Yes Camnitz, Will Hassell Done, MD  montelukast (SINGULAIR) 10 MG tablet TAKE 1 TABLET BY MOUTH EVERY DAY Patient taking  differently: Take 10 mg by mouth at bedtime. 08/19/20  Yes [name], MD  nitroGLYCERIN (NITROSTAT) 0.4 MG SL tablet PLACE 1 TABLET (0.4 MG TOTAL) UNDER THE TONGUE EVERY 5 (FIVE) MINUTES AS NEEDED FOR CHEST PAIN. 07/27/20  Yes Troy Sine, MD  rosuvastatin (CRESTOR) 20 MG tablet TAKE 1 TABLET BY MOUTH EVERY DAY Patient taking differently: Take 20 mg by mouth daily. 06/21/20  Yes Troy Sine, MD  sotalol (BETAPACE) 160 MG tablet Take 1 tablet (160 mg total) by mouth 2 (two) times daily. 12/28/20  Yes Lajean Saver, MD    Allergies    Omega-3 fatty acids, Benazepril, and Fish allergy  Review of Systems   Review of Systems  Cardiovascular:  Positive for near-syncope.  All other systems reviewed and are negative.  Physical Exam Updated Vital Signs BP 120/84 (BP Location: Right Arm)   Pulse 73   Temp (!) 97.5 F (36.4 C) (Oral)   Resp 16   SpO2 98%   Physical Exam Vitals and nursing note reviewed.  Constitutional:      Appearance: He is well-developed.  HENT:     Head: Normocephalic and atraumatic.  Cardiovascular:     Rate and Rhythm: Normal rate and regular rhythm.     Heart sounds: No murmur heard. Pulmonary:     Effort: Pulmonary effort is normal. No respiratory distress.     Breath sounds: Normal breath sounds.  Abdominal:     Palpations: Abdomen is soft.     Tenderness: There is no abdominal tenderness. There is no guarding or rebound.  Musculoskeletal:        General: No swelling or tenderness.  Skin:    General: Skin is warm and dry.  Neurological:     Mental Status: He is alert and oriented to person, place, and time.  Psychiatric:        Behavior: Behavior normal.    ED Results / Procedures / Treatments   Labs (all labs ordered are listed, but only abnormal results are displayed) Labs Reviewed  CBC WITH DIFFERENTIAL/PLATELET - Abnormal; Notable for the following components:      Result Value   RBC 5.86 (*)    Hemoglobin 17.4 (*)    HCT 55.6 (*)     Abs Immature Granulocytes 0.14 (*)    All  other components within normal limits  COMPREHENSIVE METABOLIC PANEL - Abnormal; Notable for the following components:   CO2 20 (*)    Glucose, Bld 186 (*)    Total Protein 6.3 (*)    All other components within normal limits  BRAIN NATRIURETIC PEPTIDE - Abnormal; Notable for the following components:   B Natriuretic Peptide 169.7 (*)    All other components within normal limits  BASIC METABOLIC PANEL - Abnormal; Notable for the following components:   Glucose, Bld 129 (*)    All other components within normal limits  CBC - Abnormal; Notable for the following components:   HCT 53.1 (*)    All other components within normal limits  RESP PANEL BY RT-PCR (FLU A&B, COVID) ARPGX2  MAGNESIUM  PROTIME-INR  APTT  LIDOCAINE LEVEL  URINALYSIS, COMPLETE (UACMP) WITH MICROSCOPIC  TSH  TROPONIN I (HIGH SENSITIVITY)  TROPONIN I (HIGH SENSITIVITY)    EKG None  Radiology DG Chest 2 View  Result Date: 01/17/2021 CLINICAL DATA:  Tachycardia, hypotension, palpitations EXAM: CHEST - 2 VIEW COMPARISON:  12/28/2020 FINDINGS: Frontal and lateral views of the chest demonstrates stable dual lead pacemaker. Cardiac silhouette is unremarkable. There is central vascular congestion without acute airspace disease, effusion, or pneumothorax. A 2 cm nodule projects over the left posterior sixth rib. No acute bony abnormalities. IMPRESSION: 1. 2 cm nodule within the mid left hemithorax. Follow-up chest CT recommended for further characterization. 2. Central vascular congestion without overt edema. Electronically Signed   By: Randa Ngo M.D.   On: 01/17/2021 22:48    Procedures Procedures   Medications Ordered in ED Medications  sodium chloride flush (NS) 0.9 % injection 3 mL (has no administration in time range)  sodium chloride flush (NS) 0.9 % injection 3 mL (has no administration in time range)  0.9 %  sodium chloride infusion (has no administration in time  range)  acetaminophen (TYLENOL) tablet 650 mg (has no administration in time range)  lidocaine (cardiac) 2000 mg in dextrose 5% 500 mL (4mg /mL) IV infusion (1 mg/min Intravenous New Bag/Given 01/18/21 0451)  metoprolol succinate (TOPROL-XL) 24 hr tablet 100 mg (has no administration in time range)  nitroGLYCERIN (NITROSTAT) SL tablet 0.4 mg (has no administration in time range)  rosuvastatin (CRESTOR) tablet 20 mg (has no administration in time range)  sotalol (BETAPACE) tablet 160 mg (has no administration in time range)  apixaban (ELIQUIS) tablet 5 mg (has no administration in time range)  albuterol (VENTOLIN HFA) 108 (90 Base) MCG/ACT inhaler 2 puff (has no administration in time range)  budesonide (PULMICORT) nebulizer solution 0.5 mg (has no administration in time range)  montelukast (SINGULAIR) tablet 10 mg (has no administration in time range)  insulin detemir (LEVEMIR) injection 35 Units (has no administration in time range)  insulin aspart (novoLOG) injection 0-15 Units (has no administration in time range)    ED Course  I have reviewed the triage vital signs and the nursing notes.  Pertinent labs & imaging results that were available during my care of the patient were reviewed by me and considered in my medical decision making (see chart for details).    MDM Rules/Calculators/A&P                          Pt with extensive cardiac hx here with palpitations.  Device interrogated with multiple runs of vtach. Cardiology consulted - plan to admit for ongoing treatment.   Final Clinical Impression(s) / ED Diagnoses Final  diagnoses:  V tach [facility])    Rx / DC Orders ED Discharge Orders     None        Quintella Reichert, MD 01/18/21 [phone]

## 2021-01-18 NOTE — Progress Notes (Signed)
  Echocardiogram 2D Echocardiogram has been performed.  John [name] 01/18/2021, 2:58 PM

## 2021-01-19 ENCOUNTER — Encounter (HOSPITAL_COMMUNITY): Payer: Self-pay | Admitting: Student in an Organized Health Care Education/Training Program

## 2021-01-19 LAB — BASIC METABOLIC PANEL
Anion gap: 6 (ref 5–15)
BUN: 16 mg/dL (ref 8–23)
CO2: 24 mmol/L (ref 22–32)
Calcium: 8.6 mg/dL — ABNORMAL LOW (ref 8.9–10.3)
Chloride: 110 mmol/L (ref 98–111)
Creatinine, Ser: 0.9 mg/dL (ref 0.61–1.24)
GFR, Estimated: >60 mL/min (ref >60)
Glucose, Bld: 133 mg/dL — ABNORMAL HIGH (ref 70–99)
Potassium: 4 mmol/L (ref 3.5–5.1)
Sodium: 140 mmol/L (ref 135–145)

## 2021-01-19 LAB — LIDOCAINE LEVEL: Lidocaine Lvl: 1.5 ug/mL (ref 1.5–5.0)

## 2021-01-19 LAB — GLUCOSE, CAPILLARY
Glucose-Capillary: 149 mg/dL — ABNORMAL HIGH (ref 70–99)
Glucose-Capillary: 115 mg/dL — ABNORMAL HIGH (ref 70–99)

## 2021-01-19 LAB — CBG MONITORING, ED
Glucose-Capillary: 146 mg/dL — ABNORMAL HIGH (ref 70–99)
Glucose-Capillary: 170 mg/dL — ABNORMAL HIGH (ref 70–99)

## 2021-01-19 LAB — MAGNESIUM: Magnesium: 2.1 mg/dL (ref 1.7–2.4)

## 2021-01-19 MED ORDER — SOTALOL HCL 80 MG PO TABS
160.0000 mg | ORAL_TABLET | Freq: Two times a day (BID) | ORAL | Status: DC
  Administered 2021-01-19 – 2021-01-21 (×5): 160 mg via ORAL
  Filled 2021-01-19 (×5): qty 2

## 2021-01-19 MED ORDER — SACUBITRIL-VALSARTAN 24-26 MG PO TABS
1.0000 | ORAL_TABLET | Freq: Two times a day (BID) | ORAL | Status: DC
  Administered 2021-01-19 – 2021-01-21 (×4): 1 via ORAL
  Filled 2021-01-19 (×4): qty 1

## 2021-01-19 NOTE — ED Notes (Signed)
Pt bed ready upstairs. Updated pt and daughter at bedside on POC. Gave report to IP [facility]. Pt transported by ED staff with all belongings.

## 2021-01-19 NOTE — Progress Notes (Addendum)
Progress Note  Patient Name: John [name] Date of Encounter: 01/19/2021  [facility] Cardiologist: Shelva Majestic, MD   Subjective   Unfortunately still in the ER waiting on bed, uncomfortable, othewise OK, had some palpitations early this AM  Inpatient Medications    Scheduled Meds:  budesonide  0.5 mg Inhalation BID   insulin aspart  0-15 Units Subcutaneous TID WC   insulin detemir  35 Units Subcutaneous QHS   metoprolol succinate  100 mg Oral Daily   montelukast  10 mg Oral QHS   rosuvastatin  20 mg Oral Daily   sodium chloride flush  3 mL Intravenous Q12H   sotalol  160 mg Oral Q12H   Continuous Infusions:  sodium chloride     lidocaine 1 mg/min (01/18/21 0451)   PRN Meds: sodium chloride, acetaminophen, albuterol, nitroGLYCERIN, sodium chloride flush   Vital Signs    Vitals:   01/19/21 0400 01/19/21 0500 01/19/21 0600 01/19/21 0800  BP: 113/87 123/90 122/83 (!) 119/91  Pulse: 69 69 70 70  Resp: 14 13 14 13   Temp:    98 F (36.7 C)  TempSrc:    Oral  SpO2: 97% 98% 98% 98%   No intake or output data in the 24 hours ending 01/19/21 0840 Last 3 Weights 01/06/2021 12/28/2020 12/10/2020  Weight (lbs) 216 lb 3.2 oz 221 lb 218 lb 3.2 oz  Weight (kg) 98.068 kg 100.245 kg 98.975 kg      Telemetry    SR, occ PVCs, no VT - Personally Reviewed  ECG    No new EKGs - Personally Reviewed  Physical Exam   GEN: No acute distress.   Neck: No JVD Cardiac: RRR, no murmurs, rubs, or gallops.  Respiratory: CTA b/l GI: Soft, nontender, non-distended  MS: No edema; No deformity. Neuro:  Nonfocal  Psych: Normal affect   Labs    High Sensitivity Troponin:   Recent Labs  Lab 12/28/20 0142 12/28/20 0513 01/17/21 2233 01/18/21 0027  TROPONINIHS 17 25* 13 17      Chemistry Recent Labs  Lab 01/17/21 2233 01/18/21 0432  NA 140 140  K 4.1 4.1  CL 111 110  CO2 20* 22  GLUCOSE 186* 129*  BUN 19 19  CREATININE 0.98 0.99  CALCIUM 9.5 9.0  PROT 6.3*  --    ALBUMIN 3.6  --   AST 16  --   ALT 20  --   ALKPHOS 69  --   BILITOT 0.9  --   GFRNONAA >60 >60  ANIONGAP 9 8     Hematology Recent Labs  Lab 01/17/21 2233 01/18/21 0432  WBC 9.4 8.6  RBC 5.86* 5.63  HGB 17.4* 16.9  HCT 55.6* 53.1*  MCV 94.9 94.3  MCH 29.7 30.0  MCHC 31.3 31.8  RDW 13.5 13.7  PLT 163 152    BNP Recent Labs  Lab 01/17/21 2233  BNP 169.7*     DDimer No results for input(s): DDIMER in the last 168 hours.   Radiology    DG Chest 2 View Result Date: 01/17/2021 CLINICAL DATA:  Tachycardia, hypotension, palpitations EXAM: CHEST - 2 VIEW COMPARISON:  12/28/2020 FINDINGS: Frontal and lateral views of the chest demonstrates stable dual lead pacemaker. Cardiac silhouette is unremarkable. There is central vascular congestion without acute airspace disease, effusion, or pneumothorax. A 2 cm nodule projects over the left posterior sixth rib. No acute bony abnormalities. IMPRESSION: 1. 2 cm nodule within the mid left hemithorax. Follow-up chest CT recommended for  further characterization. 2. Central vascular congestion without overt edema. Electronically Signed   By: Randa Ngo M.D.   On: 01/17/2021 22:48   Cardiac Studies   01/18/2021: TTE IMPRESSIONS   1. Left ventricular ejection fraction, by estimation, is 40 to 45%. The  left ventricle has mildly decreased function. The left ventricle  demonstrates regional wall motion abnormalities (see scoring  diagram/findings for description). Left ventricular  diastolic parameters are consistent with Grade I diastolic dysfunction  (impaired relaxation). There is mild dyskinesis of the left ventricular,  basal inferior wall and inferolateral wall.   2. Right ventricular systolic function is normal. The right ventricular  size is normal. Tricuspid regurgitation signal is inadequate for assessing  PA pressure.   3. Left atrial size was moderately dilated.   4. Right atrial size was mildly dilated.   5. The mitral  valve is normal in structure. Mild mitral valve  regurgitation.   6. The aortic valve is tricuspid. Aortic valve regurgitation is not  visualized.   7. Aortic dilatation noted. There is mild dilatation of the aortic root,  measuring 40 mm. There is mild dilatation of the ascending aorta,  measuring 41 mm.   FINDINGS   Left Ventricle: Left ventricular ejection fraction, by estimation, is 40  to 45%. The left ventricle has mildly decreased function. The left  ventricle demonstrates regional wall motion abnormalities. Mild dyskinesis  of the left ventricular, basal  inferior wall and inferolateral wall. The left ventricular internal cavity  size was normal in size. There is no left ventricular hypertrophy. Left  ventricular diastolic parameters are consistent with Grade I diastolic  dysfunction (impaired relaxation).  Normal left ventricular filling pressure.      LV Wall Scoring:  The basal inferolateral segment and basal inferior segment are dyskinetic.  The mid inferolateral segment and basal inferoseptal segment are akinetic.   Right Ventricle: The right ventricular size is normal. No increase in  right ventricular wall thickness. Right ventricular systolic function is  normal. Tricuspid regurgitation signal is inadequate for assessing PA  pressure.   Left Atrium: Left atrial size was moderately dilated.   Right Atrium: Right atrial size was mildly dilated.   Pericardium: There is no evidence of pericardial effusion.   Mitral Valve: The mitral valve is normal in structure. Mild mitral valve  regurgitation, with centrally-directed jet.   Tricuspid Valve: The tricuspid valve is normal in structure. Tricuspid  valve regurgitation is not demonstrated.   Aortic Valve: The aortic valve is tricuspid. Aortic valve regurgitation is  not visualized.   Pulmonic Valve: The pulmonic valve was normal in structure. Pulmonic valve  regurgitation is not visualized.   Aorta: Aortic  dilatation noted. There is mild dilatation of the aortic  root, measuring 40 mm. There is mild dilatation of the ascending aorta,  measuring 41 mm.   IAS/Shunts: No atrial level shunt detected by color flow Doppler.   Additional Comments: A device lead is visualized in the right ventricle  and right atrium.    08/19/20: LHC Prox RCA to Dist RCA lesion is 15% stenosed. Ost RCA to Prox RCA lesion is 40% stenosed. Ramus lesion is 60% stenosed. Dist RCA lesion is 45% stenosed. Mr. Heisler has known disease and underwent diagnostic coronary angiography by Dr. Ellyn Hack in September 2021.  He had noncritical CAD with moderate proximal RCA and ramus branch stenosis both of which were shown to be not physiologically significant by DFR.  He presented with chest pain and monomorphic VT at  220 bpm.  His enzymes rose mildly suspect related to demand ischemia.  His anatomy is unchanged from his previous cath.  His EKG on presentation suggested "scar VT".  And EP evaluation would be warranted.  Continue medical therapy is recommended.  The sheath was removed and a TR band was placed on the right wrist to achieve patent hemostasis.  The patient did receive his Eliquis last dose yesterday morning.  Heparin Xylon Croom be restarted 4 hours after sheath removal without a bolus.  The patient left lab in stable condition. [name]. MD, [facility] 08/19/2020 9:04 AM   03/01/2020 TTE IMPRESSIONS  1. Left ventricular ejection fraction, by estimation, is 45 to 50%. The  left ventricle has mildly decreased function. The left ventricle  demonstrates global hypokinesis. Left ventricular diastolic parameters are  indeterminate.   2. Right ventricular systolic function is normal. The right ventricular  size is normal.   3. Left atrial size was severely dilated.   4. The mitral valve is normal in structure. Trivial mitral valve  regurgitation. No evidence of mitral stenosis.   5. The aortic valve is tricuspid. Aortic valve  regurgitation is not  visualized. No aortic stenosis is present.   6. Aortic dilatation noted. There is mild dilatation of the aortic root,  measuring 41 mm.   7. The inferior vena cava is dilated in size with >50% respiratory  variability, suggesting right atrial pressure of 8 mmHg.    Patient Profile     70 y.o. male with a hx of  CAD(multiple prior PCIs reported in the RCA and Cx, last stenting in RCA 2010), ICM, HTN, HLD, DM, OSA (w/CPAP), TAA, ILD, quit smoking 2019, AFlutter, back surgery resulting in L foot drop, chronic back pain, VT, DM, admitted with recurrent VT storm   Device information MDT dual chamber ICD implanted 08/20/20 Secondary prevention w/hx of hemodynamically unstable MMVT   AAD hx 2018 Pulmonary function studies have shown restriction with low DLCO.  He felt possibly also to have asthma based on nitric oxide testing.  With his lung disease it has been recommended that amiodarone be considered for discontinuance Looks like amio was stopped in 2019 Follows with pulmonology March 2022 mexilletine > recurrent VT higher doses were too expensive June 2022 sotalol started  Assessment & Plan    VT Recurrent, symptomatic with SOB and weakness No syncope Well known for him Lytes looked ok   Device interrogation notes stable battery and lead measurements 10 monitored VT episodes EGMs are c/w VT 160-170's 1st treatment rate starts 182bpm   Planned for EPS/ablation tomorrow with Dr. Quentin Ore NPO after MN Stop lidocaine gtt today  Hold eliquis  2. CAD No anginal symptoms HS Trop unremarkable  3. ICM 4. Chronic CHF (systolic) Currently compensated Resume his home Entresto  5. Hx of AFlutter CHA2DS2Vasc is 5, on Eliquis, appropriately dosed  6. DM SSI   For questions or updates, please contact [place] Please consult [email or web address] for contact info under        Signed, Baldwin Jamaica, PA-C  01/19/2021, 8:40 AM    I have seen and examined  this patient with Tommye Standard.  Agree with above, note added to reflect my findings.  On exam, RRR, no murmurs.  No further ventricular arrhythmias.  Has remained in sinus rhythm with occasional PVCs.  Plan for VT ablation tomorrow.  We Zabrina Brotherton hold lidocaine at 12:00 today as well as hold Eliquis for possible retrograde approach.  Lakena Sparlin M. Conroy Goracke MD 01/19/2021  9:09 AM

## 2021-01-19 NOTE — ED Notes (Signed)
Stop lido gtt per MD at bedside

## 2021-01-19 NOTE — ED Notes (Signed)
Pt resting in stretcher with eyes closed, no distress noted. RN provided drinks, waiting for lunch tray. Pt for ablation @ 1000 01/20/21 so NPO p MN. Cardiac diet until that time. Pt aware.

## 2021-01-19 NOTE — ED Notes (Signed)
Pt sleeping on/off. Denies needs.

## 2021-01-19 NOTE — ED Notes (Signed)
Labs drawn, glucose checked, pt denied needs, resting in stretcher with call light in reach

## 2021-01-19 NOTE — ED Notes (Signed)
Report received by previous RN. Pt is resting. Used urinal at bedside. Pt denies CP/Dizziness at this time. Cardiac monitor in place showing NSR. VS WDL. Lidocaine continues to infused. Pt denies any discomfort. Warm blanket provided.

## 2021-01-19 NOTE — Progress Notes (Signed)
Patient refused CPAP for the night  

## 2021-01-19 NOTE — ED Notes (Signed)
Pt reports being awaken from sleep with a  "fluttering" sensation in chest lasting for a approx. 15 seconds and resolved sopontaneously. Denies CP/SOB. Pt Tele monitoring system reports two episodes of missed beats followed by PVCs. Report printed with pt sticker at bedside for AM cardiologist.

## 2021-01-20 ENCOUNTER — Inpatient Hospital Stay (HOSPITAL_COMMUNITY): Payer: Medicare Other | Admitting: Certified Registered Nurse Anesthetist

## 2021-01-20 ENCOUNTER — Encounter (HOSPITAL_COMMUNITY): Payer: Self-pay | Admitting: Student in an Organized Health Care Education/Training Program

## 2021-01-20 ENCOUNTER — Encounter (HOSPITAL_COMMUNITY)
Admission: EM | Disposition: A | Discharge status: Home / Self Care | Payer: Self-pay | Source: Home / Self Care | Attending: Cardiology

## 2021-01-20 HISTORY — PX: V TACH ABLATION: EP1227

## 2021-01-20 LAB — GLUCOSE, CAPILLARY
Glucose-Capillary: 110 mg/dL — ABNORMAL HIGH (ref 70–99)
Glucose-Capillary: 134 mg/dL — ABNORMAL HIGH (ref 70–99)
Glucose-Capillary: 142 mg/dL — ABNORMAL HIGH (ref 70–99)

## 2021-01-20 LAB — POCT ACTIVATED CLOTTING TIME
Activated Clotting Time: 312 seconds
Activated Clotting Time: 283 seconds
Activated Clotting Time: 271 seconds
Activated Clotting Time: 214 seconds
Activated Clotting Time: 202 seconds
Activated Clotting Time: 121 seconds

## 2021-01-20 SURGERY — V TACH ABLATION
Anesthesia: General

## 2021-01-20 MED ORDER — SUGAMMADEX SODIUM 200 MG/2ML IV SOLN
INTRAVENOUS | Status: DC | PRN
  Administered 2021-01-20: 200 mg via INTRAVENOUS

## 2021-01-20 MED ORDER — HEPARIN SODIUM (PORCINE) 1000 UNIT/ML IJ SOLN
INTRAMUSCULAR | Status: AC
  Filled 2021-01-20: qty 1

## 2021-01-20 MED ORDER — VASOPRESSIN 20 UNIT/ML IV SOLN
INTRAVENOUS | Status: DC | PRN
  Administered 2021-01-20 (×5): 1 [IU] via INTRAVENOUS

## 2021-01-20 MED ORDER — DEXAMETHASONE SODIUM PHOSPHATE 10 MG/ML IJ SOLN
INTRAMUSCULAR | Status: DC | PRN
  Administered 2021-01-20: 5 mg via INTRAVENOUS

## 2021-01-20 MED ORDER — FENTANYL CITRATE (PF) 100 MCG/2ML IJ SOLN
INTRAMUSCULAR | Status: DC | PRN
  Administered 2021-01-20: 100 ug via INTRAVENOUS

## 2021-01-20 MED ORDER — SODIUM CHLORIDE 0.9 % IV SOLN: 250.0000 mL | INTRAVENOUS | Status: DC | PRN

## 2021-01-20 MED ORDER — SODIUM CHLORIDE 0.9 % IV SOLN: INTRAVENOUS | Status: DC

## 2021-01-20 MED ORDER — PROPOFOL 10 MG/ML IV BOLUS
INTRAVENOUS | Status: DC | PRN
  Administered 2021-01-20: 150 mg via INTRAVENOUS

## 2021-01-20 MED ORDER — PROTAMINE SULFATE 10 MG/ML IV SOLN
50.0000 mg | Freq: Once | INTRAVENOUS | Status: AC
  Administered 2021-01-20: 20 mg via INTRAVENOUS

## 2021-01-20 MED ORDER — FENTANYL CITRATE (PF) 100 MCG/2ML IJ SOLN
INTRAMUSCULAR | Status: AC
  Filled 2021-01-20: qty 2

## 2021-01-20 MED ORDER — HEPARIN SODIUM (PORCINE) 1000 UNIT/ML IJ SOLN
INTRAMUSCULAR | Status: DC | PRN
  Administered 2021-01-20: 5000 [IU] via INTRAVENOUS
  Administered 2021-01-20: 4000 [IU] via INTRAVENOUS
  Administered 2021-01-20: 16000 [IU] via INTRAVENOUS

## 2021-01-20 MED ORDER — PROTAMINE SULFATE 10 MG/ML IV SOLN
INTRAVENOUS | Status: AC
  Filled 2021-01-20: qty 5

## 2021-01-20 MED ORDER — LIDOCAINE 2% (20 MG/ML) 5 ML SYRINGE
INTRAMUSCULAR | Status: DC | PRN
  Administered 2021-01-20: 80 mg via INTRAVENOUS

## 2021-01-20 MED ORDER — ONDANSETRON HCL 4 MG/2ML IJ SOLN
INTRAMUSCULAR | Status: DC | PRN
  Administered 2021-01-20: 4 mg via INTRAVENOUS

## 2021-01-20 MED ORDER — FENTANYL CITRATE (PF) 100 MCG/2ML IJ SOLN
25.0000 ug | INTRAMUSCULAR | Status: DC | PRN
  Administered 2021-01-20: 50 ug via INTRAVENOUS

## 2021-01-20 MED ORDER — HEPARIN SODIUM (PORCINE) 1000 UNIT/ML IJ SOLN
INTRAMUSCULAR | Status: DC | PRN
  Administered 2021-01-20: 1000 [IU] via INTRAVENOUS

## 2021-01-20 MED ORDER — HEPARIN (PORCINE) IN NACL 1000-0.9 UT/500ML-% IV SOLN
INTRAVENOUS | Status: DC | PRN
  Administered 2021-01-20 (×5): 500 mL

## 2021-01-20 MED ORDER — ONDANSETRON HCL 4 MG/2ML IJ SOLN: 4.0000 mg | Freq: Four times a day (QID) | INTRAMUSCULAR | Status: DC | PRN

## 2021-01-20 MED ORDER — ACETAMINOPHEN 325 MG PO TABS
650.0000 mg | ORAL_TABLET | ORAL | Status: DC | PRN
  Administered 2021-01-20: 650 mg via ORAL
  Filled 2021-01-20: qty 2

## 2021-01-20 MED ORDER — CEFAZOLIN SODIUM-DEXTROSE 2-3 GM-%(50ML) IV SOLR
INTRAVENOUS | Status: DC | PRN
  Administered 2021-01-20: 2 g via INTRAVENOUS

## 2021-01-20 MED ORDER — SODIUM CHLORIDE 0.9% FLUSH: 3.0000 mL | INTRAVENOUS | Status: DC | PRN

## 2021-01-20 MED ORDER — PHENYLEPHRINE 40 MCG/ML (10ML) SYRINGE FOR IV PUSH (FOR BLOOD PRESSURE SUPPORT)
PREFILLED_SYRINGE | INTRAVENOUS | Status: DC | PRN
  Administered 2021-01-20: 80 ug via INTRAVENOUS
  Administered 2021-01-20: 120 ug via INTRAVENOUS
  Administered 2021-01-20: 40 ug via INTRAVENOUS
  Administered 2021-01-20: 120 ug via INTRAVENOUS
  Administered 2021-01-20: 80 ug via INTRAVENOUS
  Administered 2021-01-20 (×2): 120 ug via INTRAVENOUS

## 2021-01-20 MED ORDER — ACETAMINOPHEN 10 MG/ML IV SOLN: 1000.0000 mg | Freq: Once | INTRAVENOUS | Status: DC | PRN

## 2021-01-20 MED ORDER — HEPARIN (PORCINE) IN NACL 1000-0.9 UT/500ML-% IV SOLN
INTRAVENOUS | Status: AC
  Filled 2021-01-20: qty 2000

## 2021-01-20 MED ORDER — EPHEDRINE SULFATE-NACL 50-0.9 MG/10ML-% IV SOSY
PREFILLED_SYRINGE | INTRAVENOUS | Status: DC | PRN
  Administered 2021-01-20 (×2): 5 mg via INTRAVENOUS

## 2021-01-20 MED ORDER — PHENYLEPHRINE HCL (PRESSORS) 10 MG/ML IV SOLN: INTRAVENOUS | Status: DC | PRN

## 2021-01-20 MED ORDER — PHENYLEPHRINE HCL-NACL 20-0.9 MG/250ML-% IV SOLN
INTRAVENOUS | Status: DC | PRN
  Administered 2021-01-20: 25 ug/min via INTRAVENOUS

## 2021-01-20 MED ORDER — SODIUM CHLORIDE 0.9% FLUSH
3.0000 mL | Freq: Two times a day (BID) | INTRAVENOUS | Status: DC
  Administered 2021-01-20 – 2021-01-21 (×2): 3 mL via INTRAVENOUS

## 2021-01-20 MED ORDER — ROCURONIUM BROMIDE 10 MG/ML (PF) SYRINGE
PREFILLED_SYRINGE | INTRAVENOUS | Status: DC | PRN
  Administered 2021-01-20: 20 mg via INTRAVENOUS
  Administered 2021-01-20: 60 mg via INTRAVENOUS
  Administered 2021-01-20 (×2): 20 mg via INTRAVENOUS

## 2021-01-20 MED ORDER — PROTAMINE SULFATE 10 MG/ML IV SOLN
INTRAVENOUS | Status: DC | PRN
  Administered 2021-01-20: 10 mg via INTRAVENOUS
  Administered 2021-01-20: 20 mg via INTRAVENOUS

## 2021-01-20 SURGICAL SUPPLY — 18 items
BAG SNAP BAND KOVER 36X36 (MISCELLANEOUS) ×2 IMPLANT
CATH JOSEPH QUAD ALLRED 6F REP (CATHETERS) ×2 IMPLANT
CATH OCTARAY 1.5 F (CATHETERS) ×2 IMPLANT
CATH SMTCH THERMOCOOL SF FJ (CATHETERS) ×2 IMPLANT
CATH SOUNDSTAR ECO 8FR (CATHETERS) ×2 IMPLANT
CLOSURE PERCLOSE PROSTYLE (VASCULAR PRODUCTS) ×6 IMPLANT
PACK EP LATEX FREE (CUSTOM PROCEDURE TRAY) ×2
PACK EP LF (CUSTOM PROCEDURE TRAY) ×1 IMPLANT
PAD PRO RADIOLUCENT 2001M-C (PAD) ×2 IMPLANT
PATCH CARTO3 (PAD) ×2 IMPLANT
SHEATH BAYLIS TRANSSEPTAL 98CM (NEEDLE) ×2 IMPLANT
SHEATH CARTO VIZIGO SM CVD (SHEATH) ×2 IMPLANT
SHEATH PINNACLE 4F 10CM (SHEATH) ×2 IMPLANT
SHEATH PINNACLE 5F 10CM (SHEATH) ×2 IMPLANT
SHEATH PINNACLE 8F 10CM (SHEATH) ×4 IMPLANT
SHEATH PINNACLE 9F 10CM (SHEATH) ×2 IMPLANT
SHEATH PROBE COVER 6X72 (BAG) ×2 IMPLANT
TUBING SMART ABLATE COOLFLOW (TUBING) ×2 IMPLANT

## 2021-01-20 NOTE — Anesthesia Procedure Notes (Signed)

## 2021-01-20 NOTE — Discharge Instructions (Addendum)
Post procedure care instructions No lifting over 5 lbs for 1 week. No vigorous or sexual activity for 1 week. You may return to work/your usual activities on 01/28/21. Keep procedure site clean & dry. If you notice increased pain, swelling, bleeding or pus, call/return!  You may shower after 24 hours, but no soaking in baths/hot tubs/pools for 1 week.

## 2021-01-20 NOTE — Anesthesia Preprocedure Evaluation (Addendum)
Anesthesia Evaluation  Patient identified by MRN, date of birth, ID band Patient awake    Reviewed: Allergy & Precautions, NPO status , Patient's Chart, lab work & pertinent test results  Airway Mallampati: II  TM Distance: >3 FB Neck ROM: Full    Dental  (+) Teeth Intact   Pulmonary sleep apnea and Continuous Positive Airway Pressure Ventilation , COPD,  COPD inhaler, former smoker,    Pulmonary exam normal        Cardiovascular hypertension, Pt. on medications and Pt. on home beta blockers + CAD and + Past MI   Rhythm:Regular Rate:Normal     Neuro/Psych negative neurological ROS  negative psych ROS   GI/Hepatic Neg liver ROS, GERD  Medicated,  Endo/Other  diabetes, Type 2, Insulin Dependent  Renal/GU negative Renal ROS  negative genitourinary   Musculoskeletal negative musculoskeletal ROS (+)   Abdominal (+)  Abdomen: soft. Bowel sounds: normal.  Peds  Hematology negative hematology ROS (+)   Anesthesia Other Findings   Reproductive/Obstetrics                            Anesthesia Physical Anesthesia Plan  ASA: 3  Anesthesia Plan: General   Post-op Pain Management:    Induction: Intravenous  PONV Risk Score and Plan: 2 and Ondansetron, Dexamethasone and Treatment may vary due to age or medical condition  Airway Management Planned: Mask and Oral ETT  Additional Equipment: None  Intra-op Plan:   Post-operative Plan: Extubation in OR  Informed Consent: I have reviewed the patients History and Physical, chart, labs and discussed the procedure including the risks, benefits and alternatives for the proposed anesthesia with the patient or authorized representative who has indicated his/her understanding and acceptance.     Dental advisory given  Plan Discussed with: CRNA  Anesthesia Plan Comments: (Lab Results      Component                Value               Date                       WBC                      8.6                 01/18/2021                HGB                      16.9                01/18/2021                HCT                      53.1 (H)            01/18/2021                MCV                      94.3                01/18/2021                PLT  152                 01/18/2021           Lab Results      Component                Value               Date                      NA                       140                 01/19/2021                K                        4.0                 01/19/2021                CO2                      24                  01/19/2021                GLUCOSE                  133 (H)             01/19/2021                BUN                      16                  01/19/2021                CREATININE               0.90                01/19/2021                CALCIUM                  8.6 (L)             01/19/2021                GFRNONAA                 >60                 01/19/2021                GFRAA                    >60                 02/28/2020            ECHO 08/22: IMPRESSIONS  1. Left ventricular ejection fraction, by estimation, is 40 to 45%. The  left ventricle has mildly decreased function. The left ventricle  demonstrates regional wall motion abnormalities (see scoring  diagram/findings for description). Left ventricular  diastolic parameters are consistent  with Grade I diastolic dysfunction  (impaired relaxation). There is mild dyskinesis of the left ventricular,  basal inferior wall and inferolateral wall.  2. Right ventricular systolic function is normal. The right ventricular  size is normal. Tricuspid regurgitation signal is inadequate for assessing  PA pressure.  3. Left atrial size was moderately dilated.  4. Right atrial size was mildly dilated.  5. The mitral valve is normal in structure. Mild mitral valve  regurgitation.  6. The aortic valve is  tricuspid. Aortic valve regurgitation is not  visualized.  7. Aortic dilatation noted. There is mild dilatation of the aortic root,  measuring 40 mm. There is mild dilatation of the ascending aorta,  measuring 41 mm. )       Anesthesia Quick Evaluation

## 2021-01-20 NOTE — Progress Notes (Signed)
Site area: Right groin a 5 french arterial sheath was removed  Site Prior to Removal:  Level 0  Pressure Applied For 20 MINUTES    Bedrest Beginning at 1830p  Manual:   Yes.    Patient Status During Pull:  stable  Post Pull Groin Site:  Level 0  Post Pull Instructions Given:  Yes.    Post Pull Pulses Present:  Yes.    Dressing Applied:  Yes.    Comments:

## 2021-01-20 NOTE — Discharge Summary (Signed)
DISCHARGE SUMMARY    Patient ID: John [name],  MRN: [id number], DOB/AGE: [birth year] 70 y.o.  Admit date: 01/17/2021 Discharge date: 01/21/21  Primary Care Physician: [name], MD  Primary Cardiologist: Dr. Claiborne Billings Electrophysiologist: Dr. Curt Bears  Primary Discharge Diagnosis:  VT storm  Secondary Discharge Diagnosis:  CAD ICM Chronic CHF (systolic) Currently compensated 4. HTN 5. COPD 6. DM  Allergies  Allergen Reactions   Omega-3 Fatty Acids Hives and Itching   Benazepril Other (See Comments)    hyperkalemia   Fish Allergy Itching     Procedures This Admission:  01/20/21 EPS/ablation, Dr. Quentin Ore CONCLUSIONS: 1. Sinus rhythm upon presentation   2. Two inducible ventricular tachycardias. Clinical VT was right superior axis with a precordial transition in V3. This VT was localized to the lateral papillary muscle. 3. Successful ablation of the clinical VT along the lateral papillary muscle.  4. A large basal inferior and basal inferolateral scar on unipolar mapping.  Bipolar mapping revealed scattered scar throughout the LV chamber. 5. No early apparent complications.   Brief HPI: John [name] is a 70 y.o. male with a hx of  CAD(multiple prior PCIs reported in the RCA and Cx, last stenting in RCA 2010), ICM, HTN, HLD, DM, OSA (w/CPAP), TAA, ILD, quit smoking 2019, AFlutter, back surgery resulting in L foot drop, chronic back pain, and VT was admitted to [facility] 01/17/21 with VT storm   Hospital Course:  The patient had called the service last evening with on/off palpitations that were making him feel poorly and his BP low, no syncope.  At the time of his call, was not actively having palpitations and advised if recurrent to go to the ER. He had not had further though given they happened a couple times yesterday he decided to come in and get checked. His device interrogation confirmed VT not in therapy zones he was admitted and started on lidocaine  gtt.  He had not had any symptoms of angina or recent changes to his exertional cpacity, HS trop were neg He did not have syncope  Labs largely unremarkable, not felt to be volume OL  EP was consulted and in d/w the patient he was very reluctant to use amiodarone given lung disease and was decided to pursue ablation strategy.  He underwent EPS/ablation on 01/20/21, see procedure report for full details. He has not had any recurrent VT  The patient feels well, denies any CP or SOB. He was examined by Dr. Quentin Ore and considered stable for discharge to home.   We will continue his home medicines unchanged No programming changes were made to his device    Physical Exam: Vitals:   01/20/21 2120 01/21/21 0020 01/21/21 0409 01/21/21 0834  BP:  (!) 95/59 (!) 93/59   Pulse:  69  70  Resp: 18 17 18 16   Temp:  98 F (36.7 C) 98.1 F (36.7 C)   TempSrc:  Oral Oral   SpO2:  90%    Weight:   97.1 kg   Height:        GEN- The patient is well appearing, alert and oriented x 3 today.   HEENT: normocephalic, atraumatic; sclera clear, conjunctiva pink; hearing intact; oropharynx clear Lungs- CTA b/l, normal work of breathing.  No wheezes, rales, rhonchi Heart- RRR, no murmurs, rubs or gallops, PMI not laterally displaced GI- soft, non-tender, non-distended Extremities- no clubbing, cyanosis, or edema MS- no significant deformity or atrophy Skin- warm and dry, no rash  or lesion, procedure sites are stable, no hematoma, no bruit Psych- euthymic mood, full affect Neuro- no gross defecits  Labs:   Lab Results  Component Value Date   WBC 8.6 01/18/2021   HGB 16.9 01/18/2021   HCT 53.1 (H) 01/18/2021   MCV 94.3 01/18/2021   PLT 152 01/18/2021    Recent Labs  Lab 01/17/21 2233 01/18/21 0432 01/19/21 0820  NA 140   < > 140  K 4.1   < > 4.0  CL 111   < > 110  CO2 20*   < > 24  BUN 19   < > 16  CREATININE 0.98   < > 0.90  CALCIUM 9.5   < > 8.6*  PROT 6.3*  --   --   BILITOT 0.9  --    --   ALKPHOS 69  --   --   ALT 20  --   --   AST 16  --   --   GLUCOSE 186*   < > 133*   < > = values in this interval not displayed.    Discharge Medications:  Allergies as of 01/21/2021       Reactions   Omega-3 Fatty Acids Hives, Itching   Benazepril Other (See Comments)   hyperkalemia   Fish Allergy Itching        Medication List     TAKE these medications    acetaminophen 500 MG tablet Commonly known as: TYLENOL Take 500 mg by mouth every 6 (six) hours as needed for moderate pain or headache.   albuterol 108 (90 Base) MCG/ACT inhaler Commonly known as: VENTOLIN HFA Inhale 2 puffs into the lungs every 6 (six) hours as needed for wheezing or shortness of breath.   Arnuity Ellipta 200 MCG/ACT Aepb Generic drug: Fluticasone Furoate Inhale 1 puff into the lungs daily.   Eliquis 5 MG Tabs tablet Generic drug: apixaban TAKE 1 TABLET BY MOUTH TWICE A DAY Notes to patient: Resume 01/22/21 morning   Entresto 24-26 MG Generic drug: sacubitril-valsartan TAKE 1 TABLET BY MOUTH TWICE A DAY   Jardiance 25 MG Tabs tablet Generic drug: empagliflozin TAKE 1 TABLET BY MOUTH EVERY DAY   Levemir FlexTouch 100 UNIT/ML FlexPen Generic drug: insulin detemir INJECT 46 UNITS INTO THE SKIN DAILY. DX:E11.9   metoprolol succinate 100 MG 24 hr tablet Commonly known as: TOPROL-XL Take 1 tablet (100 mg total) by mouth daily. Take with or immediately following a meal.   montelukast 10 MG tablet Commonly known as: SINGULAIR TAKE 1 TABLET BY MOUTH EVERY DAY   nitroGLYCERIN 0.4 MG SL tablet Commonly known as: NITROSTAT PLACE 1 TABLET (0.4 MG TOTAL) UNDER THE TONGUE EVERY 5 (FIVE) MINUTES AS NEEDED FOR CHEST PAIN.   NovoFine 32G X 6 MM Misc Generic drug: Insulin Pen Needle 1 each by Other route daily.   NovoLOG FlexPen 100 UNIT/ML FlexPen Generic drug: insulin aspart INJECT 5-20 UNITS SUBCUTANEOUSLY WITH LUNCH AND DINNER   OneTouch Ultra test strip Generic drug: glucose  blood USE TO CHECK BLOOD SUGAR 3 TIMES A DAY (E11.9)   rosuvastatin 20 MG tablet Commonly known as: CRESTOR TAKE 1 TABLET BY MOUTH EVERY DAY   sotalol 160 MG tablet Commonly known as: BETAPACE Take 1 tablet (160 mg total) by mouth 2 (two) times daily.        Disposition: Home Discharge Instructions     Diet - low sodium heart healthy   Complete by: As directed    Increase activity  slowly   Complete by: As directed        Follow-up Information     Shirley Friar, PA-C Follow up.   Specialty: Physician Assistant Why: 02/01/21 @ 11:00AM, for Dr. Joya San information: [place] [phone]         Constance Haw, MD Follow up.   Specialty: Cardiology Why: 02/24/21 @ 3:45PM Contact information: [address] [place] [address] [phone]                 Duration of Discharge Encounter: Greater than 30 minutes including physician time.  Venetia Night, PA-C 01/21/2021 8:48 AM

## 2021-01-20 NOTE — Transfer of Care (Signed)
Immediate Anesthesia Transfer of Care Note  Patient: John [name]  Procedure(s) Performed: Stephanie Coup ABLATION  Patient Location: Cath Lab  Anesthesia Type:General  Level of Consciousness: awake, alert  and oriented  Airway & Oxygen Therapy: Patient Spontanous Breathing and Patient connected to nasal cannula oxygen  Post-op Assessment: Report given to RN, Post -op Vital signs reviewed and stable and Patient moving all extremities X 4  Post vital signs: Reviewed and stable  Last Vitals:  Vitals Value Taken Time  BP 106/65 01/20/21 1419  Temp    Pulse 69 01/20/21 1422  Resp 16 01/20/21 1422  SpO2 94 % 01/20/21 1422  Vitals shown include unvalidated device data.  Last Pain:  Vitals:   01/20/21 0828  TempSrc:   PainSc: 0-No pain      Patients Stated Pain Goal: 0 (00/51/10 2111)  Complications: No notable events documented.

## 2021-01-20 NOTE — Progress Notes (Signed)
Pt refused CPAP, resting comfortably on RA sat 95%. RT will cont to monitor.

## 2021-01-20 NOTE — Progress Notes (Signed)
Progress Note  Patient Name: John [name] Date of Encounter: 01/20/2021  [facility] Cardiologist: Shelva Majestic, MD   Subjective   Doing well, no additional procedure questions  Inpatient Medications    Scheduled Meds:  budesonide  0.5 mg Inhalation BID   insulin aspart  0-15 Units Subcutaneous TID WC   insulin detemir  35 Units Subcutaneous QHS   metoprolol succinate  100 mg Oral Daily   montelukast  10 mg Oral QHS   rosuvastatin  20 mg Oral Daily   sacubitril-valsartan  1 tablet Oral BID   sodium chloride flush  3 mL Intravenous Q12H   sotalol  160 mg Oral Q12H   Continuous Infusions:  sodium chloride     sodium chloride 50 mL/hr at 01/20/21 0544   PRN Meds: sodium chloride, acetaminophen, albuterol, nitroGLYCERIN, sodium chloride flush   Vital Signs    Vitals:   01/20/21 0500 01/20/21 0526 01/20/21 0739 01/20/21 0828  BP:    109/78  Pulse:    72  Resp:  18    Temp:  (!) 97.4 F (36.3 C)    TempSrc:  Oral    SpO2:   93%   Weight: 96.3 kg     Height:        Intake/Output Summary (Last 24 hours) at 01/20/2021 0901 Last data filed at 01/20/2021 0600 Gross per 24 hour  Intake 1037.88 ml  Output 225 ml  Net 812.88 ml   Last 3 Weights 01/20/2021 01/19/2021 01/06/2021  Weight (lbs) 212 lb 6.4 oz 215 lb 1.6 oz 216 lb 3.2 oz  Weight (kg) 96.344 kg 97.569 kg 98.068 kg      Telemetry    Often A pacing, with occ/infrequent AV pacing, occ PVCs, no VT - Personally Reviewed  ECG    No new EKGs - Personally Reviewed  Physical Exam   Exam is unchanged GEN: No acute distress.   Neck: No JVD Cardiac: RRR, no murmurs, rubs, or gallops.  Respiratory: CTA b/l GI: Soft, nontender, non-distended  MS: No edema; No deformity. Neuro:  Nonfocal  Psych: Normal affect   Labs    High Sensitivity Troponin:   Recent Labs  Lab 12/28/20 0142 12/28/20 0513 01/17/21 2233 01/18/21 0027  TROPONINIHS 17 25* 13 17      Chemistry Recent Labs  Lab 01/17/21 2233  01/18/21 0432 01/19/21 0820  NA 140 140 140  K 4.1 4.1 4.0  CL 111 110 110  CO2 20* 22 24  GLUCOSE 186* 129* 133*  BUN 19 19 16   CREATININE 0.98 0.99 0.90  CALCIUM 9.5 9.0 8.6*  PROT 6.3*  --   --   ALBUMIN 3.6  --   --   AST 16  --   --   ALT 20  --   --   ALKPHOS 69  --   --   BILITOT 0.9  --   --   GFRNONAA >60 >60 >60  ANIONGAP 9 8 6      Hematology Recent Labs  Lab 01/17/21 2233 01/18/21 0432  WBC 9.4 8.6  RBC 5.86* 5.63  HGB 17.4* 16.9  HCT 55.6* 53.1*  MCV 94.9 94.3  MCH 29.7 30.0  MCHC 31.3 31.8  RDW 13.5 13.7  PLT 163 152    BNP Recent Labs  Lab 01/17/21 2233  BNP 169.7*     DDimer No results for input(s): DDIMER in the last 168 hours.   Radiology    DG Chest 2 View Result Date:  01/17/2021 CLINICAL DATA:  Tachycardia, hypotension, palpitations EXAM: CHEST - 2 VIEW COMPARISON:  12/28/2020 FINDINGS: Frontal and lateral views of the chest demonstrates stable dual lead pacemaker. Cardiac silhouette is unremarkable. There is central vascular congestion without acute airspace disease, effusion, or pneumothorax. A 2 cm nodule projects over the left posterior sixth rib. No acute bony abnormalities. IMPRESSION: 1. 2 cm nodule within the mid left hemithorax. Follow-up chest CT recommended for further characterization. 2. Central vascular congestion without overt edema. Electronically Signed   By: Randa Ngo M.D.   On: 01/17/2021 22:48   Cardiac Studies   01/18/2021: TTE IMPRESSIONS   1. Left ventricular ejection fraction, by estimation, is 40 to 45%. The  left ventricle has mildly decreased function. The left ventricle  demonstrates regional wall motion abnormalities (see scoring  diagram/findings for description). Left ventricular  diastolic parameters are consistent with Grade I diastolic dysfunction  (impaired relaxation). There is mild dyskinesis of the left ventricular,  basal inferior wall and inferolateral wall.   2. Right ventricular systolic  function is normal. The right ventricular  size is normal. Tricuspid regurgitation signal is inadequate for assessing  PA pressure.   3. Left atrial size was moderately dilated.   4. Right atrial size was mildly dilated.   5. The mitral valve is normal in structure. Mild mitral valve  regurgitation.   6. The aortic valve is tricuspid. Aortic valve regurgitation is not  visualized.   7. Aortic dilatation noted. There is mild dilatation of the aortic root,  measuring 40 mm. There is mild dilatation of the ascending aorta,  measuring 41 mm.   FINDINGS   Left Ventricle: Left ventricular ejection fraction, by estimation, is 40  to 45%. The left ventricle has mildly decreased function. The left  ventricle demonstrates regional wall motion abnormalities. Mild dyskinesis  of the left ventricular, basal  inferior wall and inferolateral wall. The left ventricular internal cavity  size was normal in size. There is no left ventricular hypertrophy. Left  ventricular diastolic parameters are consistent with Grade I diastolic  dysfunction (impaired relaxation).  Normal left ventricular filling pressure.      LV Wall Scoring:  The basal inferolateral segment and basal inferior segment are dyskinetic.  The mid inferolateral segment and basal inferoseptal segment are akinetic.   Right Ventricle: The right ventricular size is normal. No increase in  right ventricular wall thickness. Right ventricular systolic function is  normal. Tricuspid regurgitation signal is inadequate for assessing PA  pressure.   Left Atrium: Left atrial size was moderately dilated.   Right Atrium: Right atrial size was mildly dilated.   Pericardium: There is no evidence of pericardial effusion.   Mitral Valve: The mitral valve is normal in structure. Mild mitral valve  regurgitation, with centrally-directed jet.   Tricuspid Valve: The tricuspid valve is normal in structure. Tricuspid  valve regurgitation is not  demonstrated.   Aortic Valve: The aortic valve is tricuspid. Aortic valve regurgitation is  not visualized.   Pulmonic Valve: The pulmonic valve was normal in structure. Pulmonic valve  regurgitation is not visualized.   Aorta: Aortic dilatation noted. There is mild dilatation of the aortic  root, measuring 40 mm. There is mild dilatation of the ascending aorta,  measuring 41 mm.   IAS/Shunts: No atrial level shunt detected by color flow Doppler.   Additional Comments: A device lead is visualized in the right ventricle  and right atrium.    08/19/20: LHC Prox RCA to Dist RCA lesion  is 15% stenosed. Ost RCA to Prox RCA lesion is 40% stenosed. Ramus lesion is 60% stenosed. Dist RCA lesion is 45% stenosed. Mr. Paddock has known disease and underwent diagnostic coronary angiography by Dr. Ellyn Hack in September 2021.  He had noncritical CAD with moderate proximal RCA and ramus branch stenosis both of which were shown to be not physiologically significant by DFR.  He presented with chest pain and monomorphic VT at 220 bpm.  His enzymes rose mildly suspect related to demand ischemia.  His anatomy is unchanged from his previous cath.  His EKG on presentation suggested "scar VT".  And EP evaluation would be warranted.  Continue medical therapy is recommended.  The sheath was removed and a TR band was placed on the right wrist to achieve patent hemostasis.  The patient did receive his Eliquis last dose yesterday morning.  Heparin will be restarted 4 hours after sheath removal without a bolus.  The patient left lab in stable condition. [name]. MD, [facility] 08/19/2020 9:04 AM   03/01/2020 TTE IMPRESSIONS  1. Left ventricular ejection fraction, by estimation, is 45 to 50%. The  left ventricle has mildly decreased function. The left ventricle  demonstrates global hypokinesis. Left ventricular diastolic parameters are  indeterminate.   2. Right ventricular systolic function is normal. The right  ventricular  size is normal.   3. Left atrial size was severely dilated.   4. The mitral valve is normal in structure. Trivial mitral valve  regurgitation. No evidence of mitral stenosis.   5. The aortic valve is tricuspid. Aortic valve regurgitation is not  visualized. No aortic stenosis is present.   6. Aortic dilatation noted. There is mild dilatation of the aortic root,  measuring 41 mm.   7. The inferior vena cava is dilated in size with >50% respiratory  variability, suggesting right atrial pressure of 8 mmHg.    Patient Profile     70 y.o. male with a hx of  CAD(multiple prior PCIs reported in the RCA and Cx, last stenting in RCA 2010), ICM, HTN, HLD, DM, OSA (w/CPAP), TAA, ILD, quit smoking 2019, AFlutter, back surgery resulting in L foot drop, chronic back pain, VT, DM, admitted with recurrent VT storm   Device information MDT dual chamber ICD implanted 08/20/20 Secondary prevention w/hx of hemodynamically unstable MMVT   AAD hx 2018 Pulmonary function studies have shown restriction with low DLCO.  He felt possibly also to have asthma based on nitric oxide testing.  With his lung disease it has been recommended that amiodarone be considered for discontinuance Looks like amio was stopped in 2019 Follows with pulmonology March 2022 mexilletine > recurrent VT higher doses were too expensive June 2022 sotalol started  Assessment & Plan    VT Recurrent, symptomatic with SOB and weakness No syncope Well known for him Lytes looked ok   Device interrogation notes stable battery and lead measurements 10 monitored VT episodes EGMs are c/w VT 160-170's 1st treatment rate starts 182bpm   Planned for EPS/ablation today with Dr. Quentin Ore   2. CAD No anginal symptoms HS Trop unremarkable  3. ICM 4. Chronic CHF (systolic) Currently compensated Resume his home Entresto  Held entresto this AM ahead of anesthesia  5. Hx of AFlutter CHA2DS2Vasc is 5, on Eliquis,  appropriately dosed Resume eliquis post procedure pending procedure site stability  6. DM SSI   For questions or updates, please contact [place] Please consult [email or web address] for contact info under  Signed, Baldwin Jamaica, PA-C  01/20/2021, 9:01 AM

## 2021-01-21 LAB — GLUCOSE, CAPILLARY: Glucose-Capillary: 135 mg/dL — ABNORMAL HIGH (ref 70–99)

## 2021-01-21 NOTE — Anesthesia Postprocedure Evaluation (Signed)
Anesthesia Post Note  Patient: John [name]  Procedure(s) Performed: Stephanie Coup ABLATION     Patient location during evaluation: PACU Anesthesia Type: General Level of consciousness: awake and alert Pain management: pain level controlled Vital Signs Assessment: post-procedure vital signs reviewed and stable Respiratory status: spontaneous breathing, nonlabored ventilation, respiratory function stable and patient connected to nasal cannula oxygen Cardiovascular status: blood pressure returned to baseline and stable Postop Assessment: no apparent nausea or vomiting Anesthetic complications: no   No notable events documented.  Last Vitals:  Vitals:   01/21/21 0409 01/21/21 0834  BP: (!) 93/59   Pulse:  70  Resp: 18 16  Temp: 36.7 C   SpO2:      Last Pain:  Vitals:   01/21/21 0754  TempSrc:   PainSc: 0-No pain                 March Rummage Renley Gutman

## 2021-01-21 NOTE — Progress Notes (Signed)
Notified by CCMD that pt had 27 runs of VT. Mardee Postin PA notified via [place]. Pt. Stated no pain, but  states, "It took by breath for a bit then it passed".

## 2021-01-26 ENCOUNTER — Telehealth: Payer: Self-pay | Admitting: Cardiology

## 2021-01-26 ENCOUNTER — Other Ambulatory Visit: Payer: Self-pay

## 2021-01-26 ENCOUNTER — Encounter (HOSPITAL_COMMUNITY): Payer: Self-pay | Admitting: Emergency Medicine

## 2021-01-26 ENCOUNTER — Emergency Department (HOSPITAL_COMMUNITY): Payer: Medicare Other

## 2021-01-26 ENCOUNTER — Inpatient Hospital Stay (HOSPITAL_COMMUNITY)
Admission: EM | Admit: 2021-01-26 | Discharge: 2021-01-28 | DRG: 309 | Disposition: A | Discharge status: Home / Self Care | Payer: Medicare Other | Attending: Internal Medicine | Admitting: Internal Medicine

## 2021-01-26 DIAGNOSIS — Z91013 Allergy to seafood: Secondary | ICD-10-CM

## 2021-01-26 DIAGNOSIS — I472 Ventricular tachycardia, unspecified: Secondary | ICD-10-CM

## 2021-01-26 DIAGNOSIS — Z955 Presence of coronary angioplasty implant and graft: Secondary | ICD-10-CM

## 2021-01-26 DIAGNOSIS — I493 Ventricular premature depolarization: Secondary | ICD-10-CM | POA: Diagnosis present

## 2021-01-26 DIAGNOSIS — I2510 Atherosclerotic heart disease of native coronary artery without angina pectoris: Secondary | ICD-10-CM | POA: Diagnosis present

## 2021-01-26 DIAGNOSIS — Z7901 Long term (current) use of anticoagulants: Secondary | ICD-10-CM

## 2021-01-26 DIAGNOSIS — Z7984 Long term (current) use of oral hypoglycemic drugs: Secondary | ICD-10-CM

## 2021-01-26 DIAGNOSIS — M21372 Foot drop, left foot: Secondary | ICD-10-CM | POA: Diagnosis present

## 2021-01-26 DIAGNOSIS — M869 Osteomyelitis, unspecified: Secondary | ICD-10-CM | POA: Diagnosis not present

## 2021-01-26 DIAGNOSIS — I110 Hypertensive heart disease with heart failure: Secondary | ICD-10-CM | POA: Diagnosis present

## 2021-01-26 DIAGNOSIS — Z888 Allergy status to other drugs, medicaments and biological substances status: Secondary | ICD-10-CM

## 2021-01-26 DIAGNOSIS — I4892 Unspecified atrial flutter: Secondary | ICD-10-CM | POA: Diagnosis present

## 2021-01-26 DIAGNOSIS — Z87891 Personal history of nicotine dependence: Secondary | ICD-10-CM | POA: Diagnosis not present

## 2021-01-26 DIAGNOSIS — Z95810 Presence of automatic (implantable) cardiac defibrillator: Secondary | ICD-10-CM | POA: Diagnosis not present

## 2021-01-26 DIAGNOSIS — I959 Hypotension, unspecified: Secondary | ICD-10-CM | POA: Diagnosis present

## 2021-01-26 DIAGNOSIS — Z20822 Contact with and (suspected) exposure to covid-19: Secondary | ICD-10-CM | POA: Diagnosis present

## 2021-01-26 DIAGNOSIS — I5042 Chronic combined systolic (congestive) and diastolic (congestive) heart failure: Secondary | ICD-10-CM | POA: Diagnosis not present

## 2021-01-26 DIAGNOSIS — E119 Type 2 diabetes mellitus without complications: Secondary | ICD-10-CM | POA: Diagnosis present

## 2021-01-26 DIAGNOSIS — K219 Gastro-esophageal reflux disease without esophagitis: Secondary | ICD-10-CM | POA: Diagnosis present

## 2021-01-26 DIAGNOSIS — G4730 Sleep apnea, unspecified: Secondary | ICD-10-CM | POA: Diagnosis present

## 2021-01-26 DIAGNOSIS — I5022 Chronic systolic (congestive) heart failure: Secondary | ICD-10-CM | POA: Diagnosis present

## 2021-01-26 DIAGNOSIS — Z79899 Other long term (current) drug therapy: Secondary | ICD-10-CM

## 2021-01-26 DIAGNOSIS — H532 Diplopia: Secondary | ICD-10-CM | POA: Diagnosis not present

## 2021-01-26 DIAGNOSIS — L0390 Cellulitis, unspecified: Secondary | ICD-10-CM | POA: Diagnosis not present

## 2021-01-26 DIAGNOSIS — I69398 Other sequelae of cerebral infarction: Secondary | ICD-10-CM | POA: Diagnosis not present

## 2021-01-26 DIAGNOSIS — J849 Interstitial pulmonary disease, unspecified: Secondary | ICD-10-CM | POA: Diagnosis present

## 2021-01-26 DIAGNOSIS — Z794 Long term (current) use of insulin: Secondary | ICD-10-CM

## 2021-01-26 DIAGNOSIS — I252 Old myocardial infarction: Secondary | ICD-10-CM

## 2021-01-26 DIAGNOSIS — I255 Ischemic cardiomyopathy: Secondary | ICD-10-CM | POA: Diagnosis present

## 2021-01-26 DIAGNOSIS — E1169 Type 2 diabetes mellitus with other specified complication: Secondary | ICD-10-CM | POA: Diagnosis not present

## 2021-01-26 DIAGNOSIS — E785 Hyperlipidemia, unspecified: Secondary | ICD-10-CM | POA: Diagnosis present

## 2021-01-26 DIAGNOSIS — G4733 Obstructive sleep apnea (adult) (pediatric): Secondary | ICD-10-CM | POA: Diagnosis present

## 2021-01-26 DIAGNOSIS — R079 Chest pain, unspecified: Secondary | ICD-10-CM | POA: Diagnosis not present

## 2021-01-26 DIAGNOSIS — Z8249 Family history of ischemic heart disease and other diseases of the circulatory system: Secondary | ICD-10-CM

## 2021-01-26 LAB — CBC
HCT: 50.5 % (ref 39.0–52.0)
Hemoglobin: 16.6 g/dL (ref 13.0–17.0)
MCH: 30.3 pg (ref 26.0–34.0)
MCHC: 32.9 g/dL (ref 30.0–36.0)
MCV: 92.3 fL (ref 80.0–100.0)
Platelets: 150 10*3/uL (ref 150–400)
RBC: 5.47 MIL/uL (ref 4.22–5.81)
RDW: 13.5 % (ref 11.5–15.5)
WBC: 7.7 10*3/uL (ref 4.0–10.5)
nRBC: 0 % (ref 0.0–0.2)

## 2021-01-26 LAB — HEMOGLOBIN A1C
Hgb A1c MFr Bld: 7.8 % — ABNORMAL HIGH (ref 4.8–5.6)
Mean Plasma Glucose: 177.16 mg/dL

## 2021-01-26 LAB — BASIC METABOLIC PANEL
Anion gap: 4 — ABNORMAL LOW (ref 5–15)
BUN: 16 mg/dL (ref 8–23)
CO2: 22 mmol/L (ref 22–32)
Calcium: 8.9 mg/dL (ref 8.9–10.3)
Chloride: 113 mmol/L — ABNORMAL HIGH (ref 98–111)
Creatinine, Ser: 0.94 mg/dL (ref 0.61–1.24)
GFR, Estimated: >60 mL/min (ref >60)
Glucose, Bld: 249 mg/dL — ABNORMAL HIGH (ref 70–99)
Potassium: 4.2 mmol/L (ref 3.5–5.1)
Sodium: 139 mmol/L (ref 135–145)

## 2021-01-26 LAB — MAGNESIUM: Magnesium: 2 mg/dL (ref 1.7–2.4)

## 2021-01-26 LAB — TROPONIN I (HIGH SENSITIVITY)
Troponin I (High Sensitivity): 132 ng/L (ref <18)
Troponin I (High Sensitivity): 124 ng/L (ref <18)

## 2021-01-26 LAB — CBG MONITORING, ED
Glucose-Capillary: 168 mg/dL — ABNORMAL HIGH (ref 70–99)
Glucose-Capillary: 134 mg/dL — ABNORMAL HIGH (ref 70–99)

## 2021-01-26 MED ORDER — SODIUM CHLORIDE 0.9 % IV BOLUS
1000.0000 mL | Freq: Once | INTRAVENOUS | Status: AC
  Administered 2021-01-26: 1000 mL via INTRAVENOUS

## 2021-01-26 MED ORDER — METOPROLOL SUCCINATE ER 100 MG PO TB24
100.0000 mg | ORAL_TABLET | Freq: Every day | ORAL | Status: DC
  Administered 2021-01-28: 100 mg via ORAL
  Filled 2021-01-26 (×2): qty 1

## 2021-01-26 MED ORDER — APIXABAN 5 MG PO TABS
5.0000 mg | ORAL_TABLET | Freq: Two times a day (BID) | ORAL | Status: DC
  Administered 2021-01-26 – 2021-01-28 (×4): 5 mg via ORAL
  Filled 2021-01-26 (×4): qty 1

## 2021-01-26 MED ORDER — ACETAMINOPHEN 325 MG PO TABS: 650.0000 mg | ORAL_TABLET | ORAL | Status: DC | PRN

## 2021-01-26 MED ORDER — MONTELUKAST SODIUM 10 MG PO TABS
10.0000 mg | ORAL_TABLET | Freq: Every day | ORAL | Status: DC
  Administered 2021-01-26 – 2021-01-28 (×3): 10 mg via ORAL
  Filled 2021-01-26 (×5): qty 1

## 2021-01-26 MED ORDER — ROSUVASTATIN CALCIUM 20 MG PO TABS
20.0000 mg | ORAL_TABLET | Freq: Every day | ORAL | Status: DC
  Administered 2021-01-26 – 2021-01-28 (×3): 20 mg via ORAL
  Filled 2021-01-26 (×3): qty 1

## 2021-01-26 MED ORDER — FLUTICASONE FUROATE 200 MCG/ACT IN AEPB: 1.0000 | INHALATION_SPRAY | Freq: Every day | RESPIRATORY_TRACT | Status: DC

## 2021-01-26 MED ORDER — SOTALOL HCL 80 MG PO TABS
160.0000 mg | ORAL_TABLET | Freq: Two times a day (BID) | ORAL | Status: DC
  Administered 2021-01-26 – 2021-01-28 (×4): 160 mg via ORAL
  Filled 2021-01-26 (×5): qty 2

## 2021-01-26 MED ORDER — ALBUTEROL SULFATE HFA 108 (90 BASE) MCG/ACT IN AERS
2.0000 | INHALATION_SPRAY | Freq: Four times a day (QID) | RESPIRATORY_TRACT | Status: DC | PRN
  Filled 2021-01-26: qty 6.7

## 2021-01-26 MED ORDER — SODIUM CHLORIDE 0.9 % IV BOLUS
500.0000 mL | Freq: Once | INTRAVENOUS | Status: AC
  Administered 2021-01-26: 500 mL via INTRAVENOUS

## 2021-01-26 MED ORDER — INSULIN DETEMIR 100 UNIT/ML ~~LOC~~ SOLN
46.0000 [IU] | Freq: Every day | SUBCUTANEOUS | Status: DC
  Administered 2021-01-26 – 2021-01-27 (×2): 46 [IU] via SUBCUTANEOUS
  Filled 2021-01-26 (×3): qty 0.46

## 2021-01-26 MED ORDER — EMPAGLIFLOZIN 25 MG PO TABS
25.0000 mg | ORAL_TABLET | Freq: Every day | ORAL | Status: DC
  Administered 2021-01-26 – 2021-01-28 (×3): 25 mg via ORAL
  Filled 2021-01-26 (×4): qty 1

## 2021-01-26 MED ORDER — MEXILETINE HCL 200 MG PO CAPS
200.0000 mg | ORAL_CAPSULE | Freq: Two times a day (BID) | ORAL | Status: DC
  Administered 2021-01-26 – 2021-01-28 (×4): 200 mg via ORAL
  Filled 2021-01-26 (×6): qty 1

## 2021-01-26 MED ORDER — NITROGLYCERIN 0.4 MG SL SUBL: 0.4000 mg | SUBLINGUAL_TABLET | SUBLINGUAL | Status: DC | PRN

## 2021-01-26 MED ORDER — INSULIN DETEMIR 100 UNIT/ML FLEXPEN: 46.0000 [IU] | PEN_INJECTOR | Freq: Every day | SUBCUTANEOUS | Status: DC

## 2021-01-26 MED ORDER — FLUTICASONE PROPIONATE HFA 110 MCG/ACT IN AERO
1.0000 | INHALATION_SPRAY | Freq: Two times a day (BID) | RESPIRATORY_TRACT | Status: DC
  Administered 2021-01-26 – 2021-01-28 (×4): 1 via RESPIRATORY_TRACT
  Filled 2021-01-26: qty 12

## 2021-01-26 MED ORDER — INSULIN ASPART 100 UNIT/ML IJ SOLN
0.0000 [IU] | Freq: Three times a day (TID) | INTRAMUSCULAR | Status: DC
  Administered 2021-01-26 – 2021-01-27 (×2): 3 [IU] via SUBCUTANEOUS
  Administered 2021-01-27: 2 [IU] via SUBCUTANEOUS

## 2021-01-26 NOTE — Telephone Encounter (Signed)
Patient reports he woke up around 6 am feeling an irregular heart beat that lasted 4 hours. Reports when standing he was dizzy at times. States until 15 minutes ago he did improve although still not feeling well. [facility] remote received and shows presenting AS/VS 145-150 bpm. Total of 2 treated ATP therapies (successful)received for VT. Patient was admitted on 01/17/21 for VT and VT ablation completed on 01/20/21. Consulted with A. Tillery, PA-C and is agreeable patient needs ED evaluation d/t possibility of VT storm/symptoms.   Patient advised of [place] DMV driving restrictions x6 months as well as shock plan.            0

## 2021-01-26 NOTE — Telephone Encounter (Signed)
Spoke with the patient who was recently in the hospital for v tach ablation. He states that since then he has had several spells of irregular heart beats. He states that they usually don't last long. He reports that this morning it started around 6am. His heart rate but up in the 140s-150. Current heart rate is 135. He states that he can feel his heart fluttering. He is currently sitting still but states that if he gets up he is concerned that he will feel dizzy. He does report feeling nauseous. Patient states that he has not taken any of his medications. I have advised him to take his sotalol. He is going to send in transmission.  Patient already has a follow-up appointment with [name] on 8/16.

## 2021-01-26 NOTE — ED Triage Notes (Signed)
Pt here for heart fluttering, cp and nausea that started around 0600 today, lasted until 10. Pt states his HR was approx 140, and his "pacemaker kicked in". Pt spoke w/ PCP who told pt to go to ER

## 2021-01-26 NOTE — ED Provider Notes (Signed)
[facility] EMERGENCY DEPARTMENT Provider Note   CSN: [id number] Arrival date & time: 01/26/21  1120     History Chief Complaint  Patient presents with   Hypotension   Palpitations   Dizziness    John [name] is a 70 y.o. male.  Patient recently had ablation done for episodes of VT.  He states this morning he had about 4 hours of tachycardia and syncope resolved with cardiology that showed episodes of VT that resolved with his defibrillator.  Patient complains of weakness.  The history is provided by the patient and medical records. No language interpreter was used.  Palpitations Palpitations quality:  Regular Onset quality:  Sudden Duration:  4 hours Timing:  Unable to specify Progression:  Resolved Chronicity:  Recurrent Context: not anxiety   Relieved by:  Nothing Worsened by:  Nothing Associated symptoms: dizziness   Associated symptoms: no back pain, no chest pain and no cough   Dizziness Associated symptoms: palpitations   Associated symptoms: no chest pain, no diarrhea and no headaches       Past Medical History:  Diagnosis Date   Atrial flutter ([place])    s/p cardioversion   Coronary artery disease    Diabetes mellitus    GERD (gastroesophageal reflux disease)    History of nuclear stress test 04/04/2011   lexiscan; mod-large in size fixed inferolateral defect (scar); non-diagnostic for ischemia; low risk scan    Hyperlipidemia    Hypertension    Left foot drop    r/t past disk srugery - uses Kevlar brace   Myocardial infarction (HCC)    posterior MI   Shortness of breath    Sleep apnea    on CPAP; 04/28/2007 split-night - AHI during total sleep 44.43/hr and REM 72.56/hr    Patient Active Problem List   Diagnosis Date Noted   Coagulation defect ([place]) 12/22/2020   Non-ST elevation (NSTEMI) myocardial infarction [facility])    Ventricular tachycardia ([place]) 08/18/2020   Capsulitis 05/21/2020   Elevated troponin    Hyperglycemia due  to diabetes mellitus ([place]) 02/29/2020   Thoracic aortic aneurysm ([place]) 02/29/2020   Hypotensive episode 02/29/2020   Obesity (BMI 30.0-34.9) 02/29/2020   Lung nodule 02/19/2020   Former smoker 02/19/2020   Healthcare maintenance 02/19/2020   Acoustic neuroma ([place]) 09/11/2017   Asymmetrical sensorineural hearing loss 09/11/2017   Encounter for cardioversion procedure    Anticoagulation adequate 06/06/2016   Fatigue 10/27/2015   Bradycardia 10/27/2015   Hyperlipidemia LDL goal <70 05/20/2015   OSA on CPAP 07/23/2013   Excessive daytime sleepiness 07/23/2013   Hypertension    Hyperlipidemia    Diabetes mellitus    Chest pain 08/18/2012   Tobacco abuse 08/18/2012   Unstable angina, cath [facility] 02/27/12 02/26/2012   CAD, multiple prior RCA PCI's. Last cath 2010, Myoview low risk Oct 2012 02/26/2012   DM type 2, goal HbA1c < 7% (HCC) 02/26/2012   HTN (hypertension) 02/26/2012   Dyslipidemia, low HDL. Intol to fish oil and Noacin 02/26/2012   Sleep apnea, on C-pap 02/26/2012   COPD (chronic obstructive pulmonary disease) ([place]) 02/26/2012   Smoking, quit one week ago 02/26/2012   GERD (gastroesophageal reflux disease) 02/26/2012    Past Surgical History:  Procedure Laterality Date   [place]  2010   6 stents total   CARDIAC CATHETERIZATION  01/2000   percutaneous transluminal coronary balloon angioplasty of mid RCA stenotic lesion   CARDIAC CATHETERIZATION  06/2006  no stenting; ischemic cardiomyopathy, EF 40-45%   CARDIOVERSION N/A 07/28/2016   Procedure: CARDIOVERSION;  Surgeon: Troy Sine, MD;  Location: [place];  Service: Cardiovascular;  Laterality: N/A;   CORONARY ANGIOPLASTY  09/1998   mid-distal RCA balloon dilatation, 4.5 & 5.0 stents    CORONARY ANGIOPLASTY WITH STENT PLACEMENT  03/1994   angioplasty & stenting (non-DES) of circumflex/prox ramus intermedius   CORONARY ANGIOPLASTY WITH STENT PLACEMENT  10/1994   large iliac [id number]  stent to RCA   [place]  12/2002   4.34mm stents x2 of RCA   CORONARY ANGIOPLASTY WITH STENT PLACEMENT  01/2005   cutting balloon arthrectomy of distal RCA & Cypher DES 3.5x13; cutting balloon arthrectomy of mid RCA with Cypher DES 3.5x18   CORONARY ANGIOPLASTY WITH STENT PLACEMENT  11/2008   stenting of mid RCA with 4.0x36mm driver, non-DES   ICD IMPLANT N/A 08/20/2020   Procedure: ICD IMPLANT;  Surgeon: Constance Haw, MD;  Location: [place] CV LAB;  Service: Cardiovascular;  Laterality: N/A;   INTRAVASCULAR PRESSURE WIRE/FFR STUDY N/A 03/02/2020   Procedure: INTRAVASCULAR PRESSURE WIRE/FFR STUDY;  Surgeon: Leonie Man, MD;  Location: [place] CV LAB;  Service: Cardiovascular;  Laterality: N/A;   LEFT HEART CATH AND CORONARY ANGIOGRAPHY N/A 03/02/2020   Procedure: LEFT HEART CATH AND CORONARY ANGIOGRAPHY;  Surgeon: Leonie Man, MD;  Location: [place] CV LAB;  Service: Cardiovascular;  Laterality: N/A;   LEFT HEART CATH AND CORONARY ANGIOGRAPHY N/A 08/19/2020   Procedure: LEFT HEART CATH AND CORONARY ANGIOGRAPHY;  Surgeon: Lorretta Harp, MD;  Location: [place] [facility] LAB;  Service: Cardiovascular;  Laterality: N/A;   LEFT HEART CATHETERIZATION WITH CORONARY ANGIOGRAM N/A 02/27/2012   Procedure: LEFT HEART CATHETERIZATION WITH CORONARY ANGIOGRAM;  Surgeon: Lorretta Harp, MD;  [facility] CATH LAB;  Service: Cardiovascular;  Laterality: N/A;   TRANSTHORACIC ECHOCARDIOGRAM  07/29/2010   EF 50=55%, mod inf wall hypokinesis & mild post wall hypokinesis; LA mild-mod dilated; mild mitral annular calcif & mild MR; mild TR & elevated RV systolic pressure; AV mildly sclerotic; mild aortic root dilatation    V TACH ABLATION N/A 01/20/2021   Procedure: V TACH ABLATION;  Surgeon: Vickie Epley, MD;  Location: [place] CV LAB;  Service: Cardiovascular;  Laterality: N/A;       Family History  Problem Relation Age of Onset   Heart attack  Father     Social History   Tobacco Use   Smoking status: Former    Packs/day: 1.00    Years: 50.00    Pack years: 50.00    Types: Cigarettes    Quit date: 07/20/2016    Years since quitting: 4.5   Smokeless tobacco: Never  Vaping Use   Vaping Use: Never used  Substance Use Topics   Alcohol use: Not Currently    Alcohol/week: 0.0 standard drinks   Drug use: No    Home Medications Prior to Admission medications   Medication Sig Start Date End Date Taking? Authorizing Provider  acetaminophen (TYLENOL) 500 MG tablet Take 500 mg by mouth every 6 (six) hours as needed for moderate pain or headache.   Yes [provider]  albuterol (VENTOLIN HFA) 108 (90 Base) MCG/ACT inhaler Inhale 2 puffs into the lungs every 6 (six) hours as needed for wheezing or shortness of breath. 06/14/20  Yes Icard, Bradley L, DO  ELIQUIS 5 MG TABS tablet TAKE 1 TABLET BY MOUTH TWICE A DAY 10/13/20  Yes Constance Haw, MD  ENTRESTO 24-26 MG TAKE 1 TABLET BY MOUTH TWICE A DAY 10/08/20  Yes Troy Sine, MD  Fluticasone Furoate (ARNUITY ELLIPTA) 200 MCG/ACT AEPB Inhale 1 puff into the lungs daily. 12/29/20  Yes Icard, Bradley L, DO  insulin aspart (NOVOLOG FLEXPEN) 100 UNIT/ML FlexPen INJECT 5-20 UNITS SUBCUTANEOUSLY WITH LUNCH AND DINNER 06/16/20  Yes [name], MD  JARDIANCE 25 MG TABS tablet TAKE 1 TABLET BY MOUTH EVERY DAY 11/09/20  Yes Pickard, [name], MD  LEVEMIR FLEXTOUCH 100 UNIT/ML FlexPen INJECT 46 UNITS INTO THE SKIN DAILY. DX:E11.9 Patient taking differently: Inject 46 Units into the skin at bedtime. 01/11/21  Yes [name], MD  metoprolol succinate (TOPROL-XL) 100 MG 24 hr tablet Take 1 tablet (100 mg total) by mouth daily. Take with or immediately following a meal. 11/26/20  Yes Camnitz, Will Hassell Done, MD  montelukast (SINGULAIR) 10 MG tablet TAKE 1 TABLET BY MOUTH EVERY DAY 08/19/20  Yes [name], MD  rosuvastatin (CRESTOR) 20 MG tablet TAKE 1 TABLET BY MOUTH EVERY  DAY 06/21/20  Yes Troy Sine, MD  sotalol (BETAPACE) 160 MG tablet Take 1 tablet (160 mg total) by mouth 2 (two) times daily. 12/28/20  Yes Lajean Saver, MD  glucose blood (ONETOUCH ULTRA) test strip USE TO CHECK BLOOD SUGAR 3 TIMES A DAY (E11.9) 12/09/20   [name], MD  Insulin Pen Needle (NOVOFINE) 32G X 6 MM MISC 1 each by Other route daily. 08/19/19   [name], MD  nitroGLYCERIN (NITROSTAT) 0.4 MG SL tablet PLACE 1 TABLET (0.4 MG TOTAL) UNDER THE TONGUE EVERY 5 (FIVE) MINUTES AS NEEDED FOR CHEST PAIN. Patient not taking: Reported on 01/26/2021 07/27/20   Troy Sine, MD    Allergies    Omega-3 fatty acids, Benazepril, and Fish allergy  Review of Systems   Review of Systems  Constitutional:  Negative for appetite change and fatigue.  HENT:  Negative for congestion, ear discharge and sinus pressure.   Eyes:  Negative for discharge.  Respiratory:  Negative for cough.   Cardiovascular:  Positive for palpitations. Negative for chest pain.  Gastrointestinal:  Negative for abdominal pain and diarrhea.  Genitourinary:  Negative for frequency and hematuria.  Musculoskeletal:  Negative for back pain.  Skin:  Negative for rash.  Neurological:  Positive for dizziness. Negative for seizures and headaches.  Psychiatric/Behavioral:  Negative for hallucinations.    Physical Exam Updated Vital Signs BP 103/71   Pulse 68   Temp 98.1 F (36.7 C) (Oral)   Resp 20   Ht 6' (1.829 m)   Wt 97 kg   SpO2 96%   BMI 29.00 kg/m   Physical Exam Vitals and nursing note reviewed.  Constitutional:      Appearance: He is well-developed.  HENT:     Head: Normocephalic.     Nose: Nose normal.  Eyes:     General: No scleral icterus.    Conjunctiva/sclera: Conjunctivae normal.  Neck:     Thyroid: No thyromegaly.  Cardiovascular:     Rate and Rhythm: Normal rate and regular rhythm.     Heart sounds: No murmur heard.   No friction rub. No gallop.  Pulmonary:     Breath sounds:  No stridor. No wheezing or rales.  Chest:     Chest wall: No tenderness.  Abdominal:     General: There is no distension.     Tenderness: There is no abdominal tenderness. There  is no rebound.  Musculoskeletal:        General: Normal range of motion.     Cervical back: Neck supple.  Lymphadenopathy:     Cervical: No cervical adenopathy.  Skin:    Findings: No erythema or rash.  Neurological:     Mental Status: He is alert and oriented to person, place, and time.     Motor: No abnormal muscle tone.     Coordination: Coordination normal.  Psychiatric:        Behavior: Behavior normal.    ED Results / Procedures / Treatments   Labs (all labs ordered are listed, but only abnormal results are displayed) Labs Reviewed  BASIC METABOLIC PANEL - Abnormal; Notable for the following components:      Result Value   Chloride 113 (*)    Glucose, Bld 249 (*)    Anion gap 4 (*)    All other components within normal limits  TROPONIN I (HIGH SENSITIVITY) - Abnormal; Notable for the following components:   Troponin I (High Sensitivity) 132 (*)    All other components within normal limits  CBC  MAGNESIUM  TROPONIN I (HIGH SENSITIVITY)    EKG None  Radiology DG Chest Portable 1 View  Result Date: 01/26/2021 CLINICAL DATA:  Chest pain EXAM: PORTABLE CHEST 1 VIEW COMPARISON:  01/17/2021 FINDINGS: Unchanged position of left chest device and leads. Unchanged cardiac and mediastinal contours. Low lung volumes with vascular crowding. The previously noted 2 cm nodule projecting over the left upper lung is less well seen on the present exam secondary to the position of the cardiac device, however it likely remains present. No pleural effusion or focal consolidative opacity. The visualized skeletal structures are unremarkable. IMPRESSION: 1. Visualization of a previously noted 2 cm nodule in the left mid hemithorax is limited by the position of the cardiac device. A chest CT is once again recommended.  2.  Low lung volumes with vascular crowding. Electronically Signed   By: Merilyn Baba MD   On: 01/26/2021 13:31    Procedures Procedures   Medications Ordered in ED Medications  sodium chloride 0.9 % bolus 500 mL (0 mLs Intravenous Stopped 01/26/21 1357)  sodium chloride 0.9 % bolus 1,000 mL (1,000 mLs Intravenous New Bag/Given 01/26/21 1357)    ED Course  I have reviewed the triage vital signs and the nursing notes.  Pertinent labs & imaging results that were available during my care of the patient were reviewed by me and considered in my medical decision making (see chart for details).   CRITICAL CARE Performed by: Milton Stitzer Total critical care time: 40 minutes Critical care time was exclusive of separately billable procedures and treating other patients. Critical care was necessary to treat or prevent imminent or life-threatening deterioration. Critical care was time spent personally by me on the following activities: development of treatment plan with patient and/or surrogate as well as nursing, discussions with consultants, evaluation of patient's response to treatment, examination of patient, obtaining history from patient or surrogate, ordering and performing treatments and interventions, ordering and review of laboratory studies, ordering and review of radiographic studies, pulse oximetry and re-evaluation of patient's condition.  MDM Rules/Calculators/A&P                           Patient with episode of V. tach and now elevated troponin.  Patient is stable and cardiology will see the patient on admit and admit Final Clinical Impression(s) /  ED Diagnoses Final diagnoses:  None    Rx / DC Orders ED Discharge Orders     None        Milton Keeble, MD 01/26/21 1535

## 2021-01-26 NOTE — H&P (Addendum)
Cardiology Admission History and Physical:   Patient ID: John [name] MRN: [id number]; DOB: 12-25-1950   Admission date: 01/26/2021  PCP:  [name], MD   [facility] Providers Cardiologist:  Shelva Majestic, MD  Electrophysiologist:  Constance Haw, MD  {   Chief Complaint:  VT  Patient Profile:   John [name] is a 70 y.o. male with  hx of  CAD(multiple prior PCIs reported in the RCA and Cx, last stenting in RCA 2010), ICM, HTN, HLD, DM, OSA (w/CPAP), TAA, ILD, quit smoking 2019, AFlutter, back surgery resulting in L foot drop, chronic back pain, VT who is being seen 01/26/2021 for the evaluation of VT.  Device information MDT dual chamber ICD implanted 08/20/20 Secondary prevention w/hx of hemodynamically unstable MMVT   AAD hx 2018 Pulmonary function studies have shown restriction with low DLCO.  He felt possibly also to have asthma based on nitric oxide testing.  With his lung disease it has been recommended that amiodarone be considered for discontinuance Looks like amio was stopped in 2019 Follows with pulmonology March 2022 mexilletine > recurrent VT higher doses were too expensive June 2022 sotalol started   History of Present Illness:   [name] recent cardiac/arrhythmic history is noted: [name] history noted for having been  admitted to [facility] (via [facility]) 08/18/20 with CP, palpitations, weakness, and SOB, all progressive for a day of so increased palpitations, found in [facility] felt to be c/w VT , hypotensive and cardioverted in the ER. (1020J, 200J, 200J),given amio bolus and gtt as well as lidocaine bolus, note report that the amiodarone was stopped 2/2 bradycardia He underwent cath with no new or obstructive CAD Initially consulted by Dr. Quentin Ore, [facility] c/w VT and recommended ICD implant, (suspect to be scar mediated) Dr. Curt Bears implanted his device 08/20/20 Discharged 08/21/20   He saw Dr. Quentin Ore 08/31/20, was feeling well, had VT treated with ATP  that did not immediately terminate the tachycardia Toprol was increased, 100mg  AM and 50mg  PM Thoughts towards AAD if more arrhythmias.   Device clinic noted alert for VT/ATP x2 on 09/19/20, in d/w Dr. Curt Bears, betablocker again titrated upwards   11/26/20 device check noted more VT again, all slight below detection rate in his treatment zone Noted then as well that a higher dose of mexiletine was cost prohibitive His mexiletine was stopped, started on Sotalol 40mg  BID and his toprol reduced (2/2 fatigue)   12/01/20 seen in the ER, where he was noted to have WCT suspect to be slow VT at 147bpm and externally cardioverted, device confirmed VT 57 monitored VT episodes VT today 146-168bpm Monitor zone 150-182 VT 182-222 VF 222 That day's episode was declared by device as terminated with what looks like an ongoing VT though slower, a different morphology,  there is 1:1 as well sotalol was titrated and discharged from the ER given no syncope No device programming changes were made   12/28/20: ER visit with palpitations, again noted slow VT again his sotalol up titrated, dr. Curt Bears discussed at this visit next steps would be amiodarone vs ablation.  Discharged from the ER   01/17/21 admitted  The patient had called the service last evening with on/off palpitations that were making him feel poorly and his BP low, no syncope.  At the time of his call, was not actively having palpitations and advised if recurrent to go to the ER. He had not had further though given they happened a couple times yesterday he decided  to come in and get checked. His device interrogation confirmed VT not in therapy zones and was decided to admit him His sotalol continued Lidocaine gtt started He underwent VT ablation 01/20/21 with Dr. Quentin Ore Discharged 01/21/21 No device reprogramming was done, no changes to his medicines, note that with some pulmonary hx and question in the past and a nodule that he follows pulmonary with  and COPD he is very reluctant to revisit amiodarone.  THIS ADMISSION The patient woke about 0600 with palpitations, could feel the fluttering causing some heaviness in his chest and SOB, as this persisted he began to feel weak, unable to find a comfortable position, laying back seemed to make the heaviness in his chest worse and radiated to his back, he did have at least one pause in the episode that he felt somewhat better/had relief when his heart rate settles. Though id did start again and called the office, he sent a couple transmissions and was advised that he had some VT and therapies delivered and advised him to go to the ER. Once here his palpitations had settled though remains feeling weak and light headed.  Total time of his symptoms at home perhaps as long as 5 hours or so  LABS K+ 4.2 Mag is pending BUN/Creat 16/0.94 HS Trop 132  (suspect 2/2 hours of VT and ablation procedure last week) WBC 7.7 H/H 16/50 Plts 150  Upon arrival his initial BP 77/60 > IVF >> 101/60 At the time of our visit he is feeling well again, he has no CP, no SOB  Device CLE is reviewed Presenting rhythm was VT (146bpm) Today at 04:43 VT (176bpm) ATP was succesful 04:45 VT (176bpm) > ATP  failed though device declared event terminated (remained in a slowed VT 150bpm) Battery and lead measurements are good Zones: VT zone 162-222bpm  VF >222  Past Medical History:  Diagnosis Date   Atrial flutter ([place])    s/p cardioversion   Coronary artery disease    Diabetes mellitus    GERD (gastroesophageal reflux disease)    History of nuclear stress test 04/04/2011   lexiscan; mod-large in size fixed inferolateral defect (scar); non-diagnostic for ischemia; low risk scan    Hyperlipidemia    Hypertension    Left foot drop    r/t past disk srugery - uses Kevlar brace   Myocardial infarction ([place])    posterior MI   Shortness of breath    Sleep apnea    on CPAP; 04/28/2007 split-night - AHI during  total sleep 44.43/hr and REM 72.56/hr    Past Surgical History:  Procedure Laterality Date   [place]  2010   6 stents total   CARDIAC CATHETERIZATION  01/2000   percutaneous transluminal coronary balloon angioplasty of mid RCA stenotic lesion   CARDIAC CATHETERIZATION  06/2006   no stenting; ischemic cardiomyopathy, EF 40-45%   CARDIOVERSION N/A 07/28/2016   Procedure: CARDIOVERSION;  Surgeon: Troy Sine, MD;  Location: [place];  Service: Cardiovascular;  Laterality: N/A;   CORONARY ANGIOPLASTY  09/1998   mid-distal RCA balloon dilatation, 4.5 & 5.0 stents    CORONARY ANGIOPLASTY WITH STENT PLACEMENT  03/1994   angioplasty & stenting (non-DES) of circumflex/prox ramus intermedius   CORONARY ANGIOPLASTY WITH STENT PLACEMENT  10/1994   large iliac [id number] stent to RCA   CORONARY ANGIOPLASTY WITH STENT PLACEMENT  12/2002   4.79mm stents x2 of RCA   CORONARY ANGIOPLASTY WITH STENT PLACEMENT  01/2005  cutting balloon arthrectomy of distal RCA & Cypher DES 3.5x13; cutting balloon arthrectomy of mid RCA with Cypher DES 3.5x18   CORONARY ANGIOPLASTY WITH STENT PLACEMENT  11/2008   stenting of mid RCA with 4.0x12mm driver, non-DES   ICD IMPLANT N/A 08/20/2020   Procedure: ICD IMPLANT;  Surgeon: Constance Haw, MD;  Location: [place] [facility];  Service: Cardiovascular;  Laterality: N/A;   INTRAVASCULAR PRESSURE WIRE/FFR STUDY N/A 03/02/2020   Procedure: INTRAVASCULAR PRESSURE WIRE/FFR STUDY;  Surgeon: Leonie Man, MD;  Location: [place] [facility];  Service: Cardiovascular;  Laterality: N/A;   LEFT HEART CATH AND CORONARY ANGIOGRAPHY N/A 03/02/2020   Procedure: LEFT HEART CATH AND CORONARY ANGIOGRAPHY;  Surgeon: Leonie Man, MD;  Location: [place] [facility];  Service: Cardiovascular;  Laterality: N/A;   LEFT HEART CATH AND CORONARY ANGIOGRAPHY N/A 08/19/2020   Procedure: LEFT HEART CATH AND CORONARY ANGIOGRAPHY;  Surgeon: Lorretta Harp, MD;   Location: [place] [facility];  Service: Cardiovascular;  Laterality: N/A;   LEFT HEART CATHETERIZATION WITH CORONARY ANGIOGRAM N/A 02/27/2012   Procedure: LEFT HEART CATHETERIZATION WITH CORONARY ANGIOGRAM;  Surgeon: Lorretta Harp, MD;  Location: [facility];  Service: Cardiovascular;  Laterality: N/A;   TRANSTHORACIC ECHOCARDIOGRAM  07/29/2010   EF 50=55%, mod inf wall hypokinesis & mild post wall hypokinesis; LA mild-mod dilated; mild mitral annular calcif & mild MR; mild TR & elevated RV systolic pressure; AV mildly sclerotic; mild aortic root dilatation    V TACH ABLATION N/A 01/20/2021   Procedure: V TACH ABLATION;  Surgeon: Vickie Epley, MD;  Location: [place] [facility];  Service: Cardiovascular;  Laterality: N/A;     Medications Prior to Admission: Prior to Admission medications   Medication Sig Start Date End Date Taking? Authorizing Provider  acetaminophen (TYLENOL) 500 MG tablet Take 500 mg by mouth every 6 (six) hours as needed for moderate pain or headache.   Yes [provider]  albuterol (VENTOLIN HFA) 108 (90 Base) MCG/ACT inhaler Inhale 2 puffs into the lungs every 6 (six) hours as needed for wheezing or shortness of breath. 06/14/20  Yes Icard, Bradley L, DO  ELIQUIS 5 MG TABS tablet TAKE 1 TABLET BY MOUTH TWICE A DAY 10/13/20  Yes Camnitz, Ocie Doyne, MD  ENTRESTO 24-26 MG TAKE 1 TABLET BY MOUTH TWICE A DAY 10/08/20  Yes Troy Sine, MD  Fluticasone Furoate (ARNUITY ELLIPTA) 200 MCG/ACT AEPB Inhale 1 puff into the lungs daily. 12/29/20  Yes Icard, Bradley L, DO  insulin aspart (NOVOLOG FLEXPEN) 100 UNIT/ML FlexPen INJECT 5-20 UNITS SUBCUTANEOUSLY WITH LUNCH AND DINNER 06/16/20  Yes [name], MD  JARDIANCE 25 MG TABS tablet TAKE 1 TABLET BY MOUTH EVERY DAY 11/09/20  Yes Pickard, [name], MD  LEVEMIR FLEXTOUCH 100 UNIT/ML FlexPen INJECT 46 UNITS INTO THE SKIN DAILY. DX:E11.9 Patient taking differently: Inject 46 Units into the skin at bedtime. 01/11/21   Yes [name], MD  metoprolol succinate (TOPROL-XL) 100 MG 24 hr tablet Take 1 tablet (100 mg total) by mouth daily. Take with or immediately following a meal. 11/26/20  Yes Camnitz, Will Hassell Done, MD  montelukast (SINGULAIR) 10 MG tablet TAKE 1 TABLET BY MOUTH EVERY DAY 08/19/20  Yes [name], MD  rosuvastatin (CRESTOR) 20 MG tablet TAKE 1 TABLET BY MOUTH EVERY DAY 06/21/20  Yes Troy Sine, MD  sotalol (BETAPACE) 160 MG tablet Take 1 tablet (160 mg total) by mouth 2 (two) times daily.  12/28/20  Yes Lajean Saver, MD  glucose blood (ONETOUCH ULTRA) test strip USE TO CHECK BLOOD SUGAR 3 TIMES A DAY (E11.9) 12/09/20   [name], MD  Insulin Pen Needle (NOVOFINE) 32G X 6 MM MISC 1 each by Other route daily. 08/19/19   [name], MD  nitroGLYCERIN (NITROSTAT) 0.4 MG SL tablet PLACE 1 TABLET (0.4 MG TOTAL) UNDER THE TONGUE EVERY 5 (FIVE) MINUTES AS NEEDED FOR CHEST PAIN. Patient not taking: Reported on 01/26/2021 07/27/20   Troy Sine, MD     Allergies:    Allergies  Allergen Reactions   Omega-3 Fatty Acids Hives and Itching   Benazepril Other (See Comments)    hyperkalemia   Fish Allergy Itching    Social History:   Social History   Socioeconomic History   Marital status: Married    Spouse name: Not on file   Number of children: 3   Years of education: Not on file   Highest education level: Not on file  Occupational History   Occupation: Best boy: [employer]    Comment: [place], [place]. [place]  Tobacco Use   Smoking status: Former    Packs/day: 1.00    Years: 50.00    Pack years: 50.00    Types: Cigarettes    Quit date: 07/20/2016    Years since quitting: 4.5   Smokeless tobacco: Never  Vaping Use   Vaping Use: Never used  Substance and Sexual Activity   Alcohol use: Not Currently    Alcohol/week: 0.0 standard drinks   Drug use: No   Sexual activity: Yes  Other Topics Concern   Not on file  Social History Narrative   Not on file    Social Determinants of Health   Financial Resource Strain: Not on file  Food Insecurity: Not on file  Transportation Needs: Not on file  Physical Activity: Not on file  Stress: Not on file  Social Connections: Not on file  Intimate Partner Violence: Not on file    Family History:   The patient's family history includes Heart attack in his father.    ROS:  Please see the history of present illness.  All other ROS reviewed and negative.     Physical Exam/Data:   Vitals:   01/26/21 1415 01/26/21 1430 01/26/21 1445 01/26/21 1500  BP: 97/62 103/71 117/80 101/60  Pulse: 71 68 77 71  Resp: 20 20 17 18   Temp:      TempSrc:      SpO2: 98% 96% 99% 97%  Weight:      Height:        Intake/Output Summary (Last 24 hours) at 01/26/2021 1506 Last data filed at 01/26/2021 1357 Gross per 24 hour  Intake 500 ml  Output --  Net 500 ml   Last 3 Weights 01/26/2021 01/21/2021 01/20/2021  Weight (lbs) 213 lb 13.5 oz 214 lb 212 lb 6.4 oz  Weight (kg) 97 kg 97.07 kg 96.344 kg     Body mass index is 29 kg/m.  General:  Well nourished, well developed, in no acute distress HEENT: normal Lymph: no adenopathy Neck: no JVD Endocrine:  No thryomegaly Vascular: No carotid bruits; FA pulses 2+ bilaterally without bruits  Cardiac:  RRR; soft SM, no gallops or rubs Lungs:  CTA b/l, no wheezing, rhonchi or rales  Abd: soft, nontender, no hepatomegaly  Ext: trace edema Musculoskeletal:  No deformities Skin: warm and dry  Neuro:  known foot drop (**)  otherwise no focal abnormalities noted Psych:  Normal affect    EKG:  The ECG that was done today was personally reviewed and demonstrates  A paced, V sensed, 74bpm, no ST/T changes  Relevant CV Studies:  08/19/20: LHC Prox RCA to Dist RCA lesion is 15% stenosed. Ost RCA to Prox RCA lesion is 40% stenosed. Ramus lesion is 60% stenosed. Dist RCA lesion is 45% stenosed. Mr. Guess has known disease and underwent diagnostic coronary angiography  by Dr. Ellyn Hack in September 2021.  He had noncritical CAD with moderate proximal RCA and ramus branch stenosis both of which were shown to be not physiologically significant by DFR.  He presented with chest pain and monomorphic VT at 220 bpm.  His enzymes rose mildly suspect related to demand ischemia.  His anatomy is unchanged from his previous cath.  His EKG on presentation suggested "scar VT".  And EP evaluation would be warranted.  Continue medical therapy is recommended.  The sheath was removed and a TR band was placed on the right wrist to achieve patent hemostasis.  The patient did receive his Eliquis last dose yesterday morning.  Heparin will be restarted 4 hours after sheath removal without a bolus.  The patient left lab in stable condition. [name]. MD, [facility] 08/19/2020 9:04 AM   03/01/2020 TTE IMPRESSIONS  1. Left ventricular ejection fraction, by estimation, is 45 to 50%. The  left ventricle has mildly decreased function. The left ventricle  demonstrates global hypokinesis. Left ventricular diastolic parameters are  indeterminate.   2. Right ventricular systolic function is normal. The right ventricular  size is normal.   3. Left atrial size was severely dilated.   4. The mitral valve is normal in structure. Trivial mitral valve  regurgitation. No evidence of mitral stenosis.   5. The aortic valve is tricuspid. Aortic valve regurgitation is not  visualized. No aortic stenosis is present.   6. Aortic dilatation noted. There is mild dilatation of the aortic root,  measuring 41 mm.   7. The inferior vena cava is dilated in size with >50% respiratory  variability, suggesting right atrial pressure of 8 mmHg.  Laboratory Data:  High Sensitivity Troponin:   Recent Labs  Lab 12/28/20 0142 12/28/20 0513 01/17/21 2233 01/18/21 0027 01/26/21 1235  TROPONINIHS 17 25* 13 17 132*      Chemistry Recent Labs  Lab 01/26/21 1235  NA 139  K 4.2  CL 113*  CO2 22  GLUCOSE 249*  BUN  16  CREATININE 0.94  CALCIUM 8.9  GFRNONAA >60  ANIONGAP 4*    No results for input(s): PROT, ALBUMIN, AST, ALT, ALKPHOS, BILITOT in the last 168 hours. Hematology Recent Labs  Lab 01/26/21 1235  WBC 7.7  RBC 5.47  HGB 16.6  HCT 50.5  MCV 92.3  MCH 30.3  MCHC 32.9  RDW 13.5  PLT 150   BNPNo results for input(s): BNP, PROBNP in the last 168 hours.  DDimer No results for input(s): DDIMER in the last 168 hours.   Radiology/Studies:  DG Chest Portable 1 View Result Date: 01/26/2021 CLINICAL DATA:  Chest pain EXAM: PORTABLE CHEST 1 VIEW COMPARISON:  01/17/2021 FINDINGS: Unchanged position of left chest device and leads. Unchanged cardiac and mediastinal contours. Low lung volumes with vascular crowding. The previously noted 2 cm nodule projecting over the left upper lung is less well seen on the present exam secondary to the position of the cardiac device, however it likely remains present. No pleural effusion or focal  consolidative opacity. The visualized skeletal structures are unremarkable. IMPRESSION: 1. Visualization of a previously noted 2 cm nodule in the left mid hemithorax is limited by the position of the cardiac device. A chest CT is once again recommended. 2.  Low lung volumes with vascular crowding. Electronically Signed   By: Merilyn Baba MD   On: 01/26/2021 13:31     Assessment and Plan:   VT Symptomatic initially with palpitations though after hours prgressively more symptomatic with subsequent hypotension  Dr. Caryl Comes has seen and examined the patient and discussed a number of options. The pt feels most comfortable with an add  on AAD Will start mexiletine 200mg  BID and ask case management to assess cost burden/issue Anticipate at least 48 hours in patient  Will consider reprogramming his device for a VT zone with ATPs only perhaps   2. CAD  Cath March this year with stable, non-obstructive disease Elevated HS Trop is likely multifactorial with hours of VT and  an ablation last week   3. ICM 4. Chronic CHF (systolic) Some edema, at his baseline No SOB His BP is better currently though will hold his entresto today Given VT continue his metoprolol  5. DM Home HS insulin/jardiance, and SSI while here  6. AFlutter  CHA2DS2Vasc is 5, on Eliquis, appropriately dosed     For questions or updates, please contact [facility] Please consult [email or web address] for contact info under     Signed, Baldwin [place], PA-C  01/26/2021 3:06 PM  Ventricular tachycardia multiple , Fast > 180 bpm >> Rx ATP  Slow < 150 bpm , lasted hours  Ischemic cardiomyopathy  Atrial flutter on Apixoban      Recurrent VT post ablation-- prolonged slow VT likely contributing to the hemodynamic compromise noted with lower BP, improved with time. Therapeutic needs include 1) Programming ATP to address slow VT  would decrease VT 1 zone to 130 BPM with multiple ATP 2) adjunctive therapy to prevent VT.  Intolerant of amio in the past, and disinclined for repeat ablation, ( I had mentioned Dr [facility] in [place])--so would add something to the sotalol, options included mex, ranolazine and quinidine.  Would undertake in that order  Would admit and plan at least 48 hr in house to validate current therapeutic effort  Reviewed with patient and discussed with Dr CL

## 2021-01-26 NOTE — Telephone Encounter (Signed)
Patient c/o Palpitations:  High priority if patient c/o lightheadedness, shortness of breath, or chest pain  How long have you had palpitations/irregular HR/ Afib? Are you having the symptoms now? Started 6 AM this morning, having symptoms now  Are you currently experiencing lightheadedness, SOB or CP? No   Do you have a history of afib (atrial fibrillation) or irregular heart rhythm? Yes  Have you checked your BP or HR? (document readings if available):  145 9:30 AM  Are you experiencing any other symptoms? Occasional nausea

## 2021-01-26 NOTE — ED Notes (Signed)
Pt reports he had 1 episode of HR 140's per MD who had him transmit his pacemaker to their office. Pt reports he was having dizziness, nausea, palpitations from 0600-1000. Pt then reports s/s mostly resolved other than dizziness and weakness

## 2021-01-27 LAB — CBG MONITORING, ED
Glucose-Capillary: 130 mg/dL — ABNORMAL HIGH (ref 70–99)
Glucose-Capillary: 171 mg/dL — ABNORMAL HIGH (ref 70–99)

## 2021-01-27 LAB — BASIC METABOLIC PANEL
Anion gap: 3 — ABNORMAL LOW (ref 5–15)
BUN: 12 mg/dL (ref 8–23)
CO2: 26 mmol/L (ref 22–32)
Calcium: 8.7 mg/dL — ABNORMAL LOW (ref 8.9–10.3)
Chloride: 111 mmol/L (ref 98–111)
Creatinine, Ser: 1.02 mg/dL (ref 0.61–1.24)
GFR, Estimated: >60 mL/min (ref >60)
Glucose, Bld: 224 mg/dL — ABNORMAL HIGH (ref 70–99)
Potassium: 4.1 mmol/L (ref 3.5–5.1)
Sodium: 140 mmol/L (ref 135–145)

## 2021-01-27 LAB — RESP PANEL BY RT-PCR (FLU A&B, COVID) ARPGX2
Influenza A by PCR: NEGATIVE
Influenza B by PCR: NEGATIVE
SARS Coronavirus 2 by RT PCR: NEGATIVE

## 2021-01-27 LAB — GLUCOSE, CAPILLARY
Glucose-Capillary: 97 mg/dL (ref 70–99)
Glucose-Capillary: 182 mg/dL — ABNORMAL HIGH (ref 70–99)

## 2021-01-27 NOTE — Progress Notes (Signed)
Progress Note  Patient Name: John [name] Date of Encounter: 01/27/2021  [facility] Cardiologist: Shelva Majestic, MD   Subjective   Some brief palpitations last night, impossible to sleep in the ER  Inpatient Medications    Scheduled Meds:  apixaban  5 mg Oral BID   empagliflozin  25 mg Oral Daily   fluticasone  1 puff Inhalation BID   insulin aspart  0-15 Units Subcutaneous TID WC   insulin detemir  46 Units Subcutaneous QHS   metoprolol succinate  100 mg Oral Daily   mexiletine  200 mg Oral Q12H   montelukast  10 mg Oral Daily   rosuvastatin  20 mg Oral Daily   sotalol  160 mg Oral BID   Continuous Infusions:  PRN Meds: acetaminophen, albuterol, nitroGLYCERIN   Vital Signs    Vitals:   01/27/21 0630 01/27/21 0700 01/27/21 0730 01/27/21 0800  BP: 113/81 114/86 112/84 126/84  Pulse: 70 69 68 71  Resp: 16 14 16 15   Temp:      TempSrc:      SpO2: 95% 96% 95% 96%  Weight:      Height:        Intake/Output Summary (Last 24 hours) at 01/27/2021 0841 Last data filed at 01/27/2021 0816 Gross per 24 hour  Intake 1500 ml  Output 1400 ml  Net 100 ml   Last 3 Weights 01/26/2021 01/21/2021 01/20/2021  Weight (lbs) 213 lb 13.5 oz 214 lb 212 lb 6.4 oz  Weight (kg) 97 kg 97.07 kg 96.344 kg      Telemetry    A paced, V sened, occ PVCs, less frequently couplet, MVP pacing  behavior is observed - Personally Reviewed  ECG    No new EKGs - Personally Reviewed  Physical Exam   GEN: No acute distress.   Neck: No JVD Cardiac: RRR, no murmurs, rubs, or gallops.  Respiratory: CTA b/l. GI: Soft, nontender, non-distended  MS: No edema; No deformity. Neuro:  Nonfocal  Psych: Normal affect   Labs    High Sensitivity Troponin:   Recent Labs  Lab 01/17/21 2233 01/18/21 0027 01/26/21 1235 01/26/21 1421  TROPONINIHS 13 17 132* 124*      Chemistry Recent Labs  Lab 01/26/21 1235 01/27/21 0052  NA 139 140  K 4.2 4.1  CL 113* 111  CO2 22 26  GLUCOSE 249*  224*  BUN 16 12  CREATININE 0.94 1.02  CALCIUM 8.9 8.7*  GFRNONAA >60 >60  ANIONGAP 4* 3*     Hematology Recent Labs  Lab 01/26/21 1235  WBC 7.7  RBC 5.47  HGB 16.6  HCT 50.5  MCV 92.3  MCH 30.3  MCHC 32.9  RDW 13.5  PLT 150    BNPNo results for input(s): BNP, PROBNP in the last 168 hours.   DDimer No results for input(s): DDIMER in the last 168 hours.   Radiology    DG Chest Portable 1 View  Result Date: 01/26/2021 CLINICAL DATA:  Chest pain EXAM: PORTABLE CHEST 1 VIEW COMPARISON:  01/17/2021 FINDINGS: Unchanged position of left chest device and leads. Unchanged cardiac and mediastinal contours. Low lung volumes with vascular crowding. The previously noted 2 cm nodule projecting over the left upper lung is less well seen on the present exam secondary to the position of the cardiac device, however it likely remains present. No pleural effusion or focal consolidative opacity. The visualized skeletal structures are unremarkable. IMPRESSION: 1. Visualization of a previously noted 2 cm nodule in  the left mid hemithorax is limited by the position of the cardiac device. A chest CT is once again recommended. 2.  Low lung volumes with vascular crowding. Electronically Signed   By: Merilyn Baba MD   On: 01/26/2021 13:31    Cardiac Studies   01/20/21: EPS/ablation CONCLUSIONS: 1. Sinus rhythm upon presentation   2. Two inducible ventricular tachycardias. Clinical VT was right superior axis with a precordial transition in V3. This VT was localized to the lateral papillary muscle. 3. Successful ablation of the clinical VT along the lateral papillary muscle.  4. A large basal inferior and basal inferolateral scar on unipolar mapping.  Bipolar mapping revealed scattered scar throughout the LV chamber. 5. No early apparent complications.   08/19/20: LHC Prox RCA to Dist RCA lesion is 15% stenosed. Ost RCA to Prox RCA lesion is 40% stenosed. Ramus lesion is 60% stenosed. Dist RCA lesion  is 45% stenosed. Mr. Winterhalter has known disease and underwent diagnostic coronary angiography by Dr. Ellyn Hack in September 2021.  He had noncritical CAD with moderate proximal RCA and ramus branch stenosis both of which were shown to be not physiologically significant by DFR.  He presented with chest pain and monomorphic VT at 220 bpm.  His enzymes rose mildly suspect related to demand ischemia.  His anatomy is unchanged from his previous cath.  His EKG on presentation suggested "scar VT".  And EP evaluation would be warranted.  Continue medical therapy is recommended.  The sheath was removed and a TR band was placed on the right wrist to achieve patent hemostasis.  The patient did receive his Eliquis last dose yesterday morning.  Heparin will be restarted 4 hours after sheath removal without a bolus.  The patient left lab in stable condition. [name]. MD, [facility] 08/19/2020 9:04 AM   03/01/2020 TTE IMPRESSIONS  1. Left ventricular ejection fraction, by estimation, is 45 to 50%. The  left ventricle has mildly decreased function. The left ventricle  demonstrates global hypokinesis. Left ventricular diastolic parameters are  indeterminate.   2. Right ventricular systolic function is normal. The right ventricular  size is normal.   3. Left atrial size was severely dilated.   4. The mitral valve is normal in structure. Trivial mitral valve  regurgitation. No evidence of mitral stenosis.   5. The aortic valve is tricuspid. Aortic valve regurgitation is not  visualized. No aortic stenosis is present.   6. Aortic dilatation noted. There is mild dilatation of the aortic root,  measuring 41 mm.   7. The inferior vena cava is dilated in size with >50% respiratory  variability, suggesting right atrial pressure of 8 mmHg.    Patient Profile     70 y.o. male with  hx of  CAD(multiple prior PCIs reported in the RCA and Cx, last stenting in RCA 2010), ICM, HTN, HLD, DM, OSA (w/CPAP), TAA, ILD, quit smoking  2019, AFlutter, back surgery resulting in L foot drop, chronic back pain, VT who is being seen 01/26/2021 for the evaluation of VT.   Device information MDT dual chamber ICD implanted 08/20/20 Secondary prevention w/hx of hemodynamically unstable MMVT   AAD hx 2018 Pulmonary function studies have shown restriction with low DLCO.  He felt possibly also to have asthma based on nitric oxide testing.  With his lung disease it has been recommended that amiodarone be considered for discontinuance Looks like amio was stopped in 2019 Follows with pulmonology March 2022 mexilletine > recurrent VT higher doses were too expensive  June 2022 sotalol started  Device CLE was reviewed Presenting rhythm was VT (146bpm) Today at 04:43 VT (176bpm) ATP was succesful 04:45 VT (176bpm) > ATP  failed though device declared event terminated (remained in a slowed VT 150bpm) Battery and lead measurements are good Zones: VT zone 162-222bpm  VF >222  Assessment & Plan    VT Symptomatic initially with palpitations though after hours prgressively more symptomatic with subsequent hypotension  BP remains much improved though will hold Entresto today again [name] added await case management benefits check Discussed with industry will reduce VT zone detection rate to low 140's      2. CAD  Cath March this year with stable, non-obstructive disease Elevated HS Trop is likely multifactorial with hours of VT and an ablation last week No anginal symptoms   3. ICM 4. Chronic CHF (systolic) Some edema, at his baseline No SOB His BP is better currently though will hold his entresto today Given VT continue his metoprolol   5. DM Home HS insulin/jardiance, and SSI while here   6. AFlutter  CHA2DS2Vasc is 5, on Eliquis, appropriately dosed  For questions or updates, please contact [facility] Please consult [email or web address] for contact info under        Signed, Baldwin Jamaica, PA-C  01/27/2021, 8:41  AM

## 2021-01-27 NOTE — Progress Notes (Signed)
Pt arrived to 4E from ED. Telemetry box applied, CCMD notified. VSS. Pt states pain 0/10 on pain scale. Pt oriented to room and staff. Call bell in reach.  Daymon Larsen, RN

## 2021-01-27 NOTE — ED Notes (Signed)
Report given to Francetta Found, RN

## 2021-01-27 NOTE — ED Notes (Signed)
Report attempted at 96

## 2021-01-28 ENCOUNTER — Other Ambulatory Visit (HOSPITAL_COMMUNITY): Payer: Self-pay

## 2021-01-28 ENCOUNTER — Telehealth: Payer: Self-pay | Admitting: Physician Assistant

## 2021-01-28 LAB — GLUCOSE, CAPILLARY
Glucose-Capillary: 84 mg/dL (ref 70–99)
Glucose-Capillary: 165 mg/dL — ABNORMAL HIGH (ref 70–99)

## 2021-01-28 MED ORDER — SOTALOL HCL 160 MG PO TABS: 160.0000 mg | ORAL_TABLET | Freq: Two times a day (BID) | ORAL | 6 refills | Status: DC

## 2021-01-28 MED ORDER — MEXILETINE HCL 200 MG PO CAPS: 200.0000 mg | ORAL_CAPSULE | Freq: Two times a day (BID) | ORAL | 6 refills | Status: DC

## 2021-01-28 NOTE — Plan of Care (Signed)
  Problem: Education: Goal: Knowledge of General Education information will improve Description: Including pain rating scale, medication(s)/side effects and non-pharmacologic comfort measures Outcome: Adequate for Discharge   

## 2021-01-28 NOTE — Telephone Encounter (Signed)
John [name], pt is returning your callback in regards to [name] samples and cost.  Please advise and return callback to triage if you need our assistance.  Will also route this to our Prior Auth Nurse, to see if she could assist with getting the cost of this med reduced for the pt.  Thanks!

## 2021-01-28 NOTE — Telephone Encounter (Signed)
Pt c/o medication issue:  1. Name of Medication:  mexiletine (MEXITIL) 200 MG capsule  2. How are you currently taking this medication (dosage and times per day)? N/A  3. Are you having a reaction (difficulty breathing--STAT)? N/A  4. What is your medication issue? Remo Lipps is calling stating he is returning Renee's call in regards to wanting to know if they had this medication in stock. He reports they do have this medication, but it is not covered by the patient's insurance. Please advise.

## 2021-01-28 NOTE — Progress Notes (Signed)
Patient taken to vehicle in wheelchair by staff along with patients belongings.

## 2021-01-28 NOTE — TOC Benefit Eligibility Note (Signed)
Transition of Care [facility]) Benefit Eligibility Note    Patient Details  Name: John [name] MRN: [id number] Date of Birth: [birth year]   Medication/Dose: [name]  200 MG BID  Covered?: No     Prescription Coverage [facility] [place] with Person/Company/Phone Number:: MARIA  @  [place] # [phone]     Prior Approval: Yes [phone])          Memory Argue Phone Number: 01/28/2021, 12:08 PM

## 2021-01-28 NOTE — Plan of Care (Signed)

## 2021-01-28 NOTE — Discharge Summary (Signed)
ELECTROPHYSIOLOGY PROCEDURE DISCHARGE SUMMARY    Patient ID: John [name],  MRN: [id number], DOB/AGE: 1951-01-15 70 y.o.  Admit date: 01/26/2021 Discharge date: 01/28/2021  Primary Care Physician: [name], MD  Primary Cardiologist: Dr. Claiborne Billings Electrophysiologist: Dr. Curt Bears  Primary Discharge Diagnosis:  1.VT  Secondary Discharge Diagnosis:  CAD 2.   ICM 3.   Chronic CHF Compensated 4. DM 5. Aflutter CHA2DS2Vasc is 5, on Eliquis   Allergies  Allergen Reactions   Omega-3 Fatty Acids Hives and Itching   Benazepril Other (See Comments)    hyperkalemia   Fish Allergy Itching     Procedures This Admission:  none  Brief HPI: John [name] is a 70 y.o. male with  hx of  CAD(multiple prior PCIs reported in the RCA and Cx, last stenting in RCA 2010), ICM, HTN, HLD, DM, OSA (w/CPAP), TAA, ILD, quit smoking 2019, AFlutter, back surgery resulting in L foot drop, chronic back pain, VT was admitted with several hours of palpitations at home, progessive weakness, SOB device transmission to the office confirmed he had been having VT and he as advised to go to the ER.   Hospital Course:  The patient was not in VT on arrival to the ER though was hypotensive.  He had no anginal symptoms, and with IVF BP steadily improved.Labs were notable for HS trop elevated though this felt 2/2 hours of VT and VT ablation he had only a week prior. Device interrogation noted VT that once was successfully terminated though a second that slowed the VT rate to below detection in the 147-150's and was declared terminated. His VT zone HR was reduced to 140bpm He was started on mexiletine.  He was monitored on telemetry throughout his stay with  A pacing/V sensing, occ PVCs, no VT.  The patient tolerated mexiletine, he feels well, denies any CP or SOB, he was examined by Dr. Quentin Ore and considered stable for discharge to home.   [name] is not on his insurance formulary [facility]47.35 for 60 caps, the patient reports this as anacceptable cost. I called his pharmacy and was told [facility] as long as the patient has the coupon.  I have printed it and given it to the patient. His [facility] did not have in stock until Monday though the [facility].  The pt was ok to have his Rx for the mexiletine sent to the [facility] and is sent refills for his [facility]otalol [facility]  The patient is already on no driving restriction.  He was reminded that the cl;ock restarted.  Physical Exam: Vitals:   01/28/21 0405 01/28/21 0640 01/28/21 0804 01/28/21 0812  BP: 110/74   125/79  Pulse: 72   69  Resp: 19   18  Temp: 97.8 F (36.6 C)   97.9 F (36.6 C)  TempSrc: Oral   Oral  SpO2: 97%  96% 97%  Weight:  98.4 kg    Height:        GEN- The patient is well appearing, alert and oriented x 3 today.   HEENT: normocephalic, atraumatic; sclera clear, conjunctiva pink; hearing intact; oropharynx clear Lungs- CTA b/l, normal work of breathing.  No wheezes, rales, rhonchi Heart- RRR, no murmurs, rubs or gallops, PMI not laterally displaced GI- soft, non-tender, non-distended Extremities- no clubbing, cyanosis, or edema MS- no significant deformity or atrophy Skin- warm and dry, no rash  or lesion Psych- euthymic mood, full affect Neuro- no gross defecits  Labs:   Lab Results  Component Value Date   WBC 7.7 01/26/2021   HGB 16.6 01/26/2021   HCT 50.5 01/26/2021   MCV 92.3 01/26/2021   PLT 150 01/26/2021    Recent Labs  Lab 01/27/21 0052  NA 140  K 4.1  CL 111  CO2 26  BUN 12  CREATININE 1.02  CALCIUM 8.7*  GLUCOSE 224*    Discharge Medications:  Allergies as of 01/28/2021       Reactions   Omega-3 Fatty Acids Hives, Itching   Benazepril Other (See Comments)   hyperkalemia   Fish Allergy Itching        Medication List     TAKE these medications    acetaminophen 500 MG tablet Commonly known as:  TYLENOL Take 500 mg by mouth every 6 (six) hours as needed for moderate pain or headache.   albuterol 108 (90 Base) MCG/ACT inhaler Commonly known as: VENTOLIN HFA Inhale 2 puffs into the lungs every 6 (six) hours as needed for wheezing or shortness of breath.   Arnuity Ellipta 200 MCG/ACT Aepb Generic drug: Fluticasone Furoate Inhale 1 puff into the lungs daily.   Eliquis 5 MG Tabs tablet Generic drug: apixaban TAKE 1 TABLET BY MOUTH TWICE A DAY   Entresto 24-26 MG Generic drug: sacubitril-valsartan TAKE 1 TABLET BY MOUTH TWICE A DAY   Jardiance 25 MG Tabs tablet Generic drug: empagliflozin TAKE 1 TABLET BY MOUTH EVERY DAY   Levemir FlexTouch 100 UNIT/ML FlexPen Generic drug: insulin detemir INJECT 46 UNITS INTO THE SKIN DAILY. DX:E11.9   metoprolol succinate 100 MG 24 hr tablet Commonly known as: TOPROL-XL Take 1 tablet (100 mg total) by mouth daily. Take with or immediately following a meal.   mexiletine 200 MG capsule Commonly known as: MEXITIL Take 1 capsule (200 mg total) by mouth every 12 (twelve) hours.   montelukast 10 MG tablet Commonly known as: SINGULAIR TAKE 1 TABLET BY MOUTH EVERY DAY   nitroGLYCERIN 0.4 MG SL tablet Commonly known as: NITROSTAT PLACE 1 TABLET (0.4 MG TOTAL) UNDER THE TONGUE EVERY 5 (FIVE) MINUTES AS NEEDED FOR CHEST PAIN.   NovoFine 32G X 6 MM Misc Generic drug: Insulin Pen Needle 1 each by Other route daily.   NovoLOG FlexPen 100 UNIT/ML FlexPen Generic drug: insulin aspart INJECT 5-20 UNITS SUBCUTANEOUSLY WITH LUNCH AND DINNER   OneTouch Ultra test strip Generic drug: glucose blood USE TO CHECK BLOOD SUGAR 3 TIMES A DAY (E11.9)   rosuvastatin 20 MG tablet Commonly known as: CRESTOR TAKE 1 TABLET BY MOUTH EVERY DAY   sotalol 160 MG tablet Commonly known as: BETAPACE Take 1 tablet (160 mg total) by mouth 2 (two) times daily.        Disposition: Home Discharge Instructions     Diet - low sodium heart healthy    Complete by: As directed    Increase activity slowly   Complete by: As directed         Duration of Discharge Encounter: Greater than 30 minutes including physician time.  Venetia Night, PA-C 01/28/2021 10:52 AM

## 2021-01-28 NOTE — TOC Benefit Eligibility Note (Signed)
Patient Teacher, English as a foreign language completed.    The patient is currently admitted and upon discharge could be taking mexiletine (Mexitil) 200 mg Capsule.  Not on Formulary  The patient is insured through [place], [place] Patient Advocate Specialist [place] Team Direct Number: [phone]  Fax: [phone]

## 2021-01-28 NOTE — Progress Notes (Signed)
Discharge instructions (including medications) discussed with and copy provided to patient/caregiver 

## 2021-01-30 NOTE — Progress Notes (Signed)
Electrophysiology Office Note Date: 02/01/2021  ID:  John [name], John [name] 1951/05/27, MRN [id number]  PCP: [name], MD Primary Cardiologist: Shelva Majestic, MD Electrophysiologist: Constance Haw, MD   CC: Routine ICD follow-up  John [name] is a 70 y.o. male with  hx of  CAD(multiple prior PCIs reported in the RCA and Cx, last stenting in RCA 2010), ICM, HTN, HLD, DM, OSA (w/CPAP), TAA, ILD, quit smoking 2019, AFlutter, back surgery resulting in L foot drop, chronic back pain and VT seen today for John Meredith Leeds, MD for post hospital follow up.    John [name] recent cardiac/arrhythmic history is noted: John [name] history noted for having been  admitted to [facility] (via [facility]) 08/18/20 with CP, palpitations, weakness, and SOB, all progressive for a day of so increased palpitations, found in [facility] felt to be c/w VT , hypotensive and cardioverted in the ER. (1020J, 200J, 200J),given amio bolus and gtt as well as lidocaine bolus, note report that the amiodarone was stopped 2/2 bradycardia He underwent cath with no new or obstructive CAD Initially consulted by John [name], [facility] c/w VT and recommended ICD implant, (suspect to be scar mediated) Dr. Curt Bears implanted his device 08/20/20 Discharged 08/21/20   He saw John [name] 08/31/20, was feeling well, had VT treated with ATP that did not immediately terminate the tachycardia Toprol was increased, 100mg  AM and 50mg  PM Thoughts towards AAD if more arrhythmias.   Device clinic noted alert for VT/ATP x2 on 09/19/20, in d/w Dr. Curt Bears, betablocker again titrated upwards   11/26/20 device check noted more VT again, all slight below detection rate in his treatment zone Noted then as well that a higher dose of mexiletine was cost prohibitive His mexiletine was stopped, started on Sotalol 40mg  BID and his toprol reduced (2/2 fatigue)   12/01/20 seen in the ER, where he was noted to have WCT suspect to be slow VT at 147bpm and externally  cardioverted, device confirmed VT 57 monitored VT episodes VT today 146-168bpm Monitor zone 150-182 VT 182-222 VF 222 That day's episode was declared by device as terminated with what looks like an ongoing VT though slower, a different morphology,  there is 1:1 as well sotalol was titrated and discharged from the ER given no syncope No device programming changes were made   12/28/20: ER visit with palpitations, again noted slow VT again his sotalol up titrated, dr. Curt Bears discussed at this visit next steps would be amiodarone vs ablation.  Discharged from the ER   01/17/21 admitted  The patient had called the service last evening with on/off palpitations that were making him feel poorly and his BP low, no syncope.  At the time of his call, was not actively having palpitations and advised if recurrent to go to the ER. He had not had further though given they happened a couple times yesterday he decided to come in and get checked. His device interrogation confirmed VT not in therapy zones and was decided to admit him His sotalol continued Lidocaine gtt started He underwent VT ablation 01/20/21 with John [name] Discharged 01/21/21 No device reprogramming was done, no changes to his medicines, note that with some pulmonary hx and question in the past and a nodule that he follows pulmonary with and COPD he is very reluctant to revisit amiodarone.  Admitted 01/28/21 with again recurrent VT. [name] added to sotalol.  Since discharge from hospital the patient reports doing better and better.  he denies chest pain,  palpitations, dyspnea, PND, orthopnea, nausea, vomiting, dizziness, syncope, edema, weight gain, or early satiety. He has not had ICD shocks.   Device History: MDT dual chamber ICD implanted 08/20/20 Secondary prevention w/hx of hemodynamically unstable MMVT  AAD hx 2018 Pulmonary function studies have shown restriction with low DLCO.  He felt possibly also to have asthma based on nitric  oxide testing.  With his lung disease it has been recommended that amiodarone be considered for discontinuance Looks like amio was stopped in 2019 Follows with pulmonology March 2022 mexilletine > recurrent VT higher doses were too expensive June 2022 sotalol started  Past Medical History:  Diagnosis Date   Atrial flutter ([place])    s/p cardioversion   Coronary artery disease    Diabetes mellitus    GERD (gastroesophageal reflux disease)    History of nuclear stress test 04/04/2011   lexiscan; mod-large in size fixed inferolateral defect (scar); non-diagnostic for ischemia; low risk scan    Hyperlipidemia    Hypertension    Left foot drop    r/t past disk srugery - uses Kevlar brace   Myocardial infarction ([place])    posterior MI   Shortness of breath    Sleep apnea    on CPAP; 04/28/2007 split-night - AHI during total sleep 44.43/hr and REM 72.56/hr   Past Surgical History:  Procedure Laterality Date   [place]  2010   6 stents total   CARDIAC CATHETERIZATION  01/2000   percutaneous transluminal coronary balloon angioplasty of mid RCA stenotic lesion   CARDIAC CATHETERIZATION  06/2006   no stenting; ischemic cardiomyopathy, EF 40-45%   CARDIOVERSION N/A 07/28/2016   Procedure: CARDIOVERSION;  Surgeon: Troy Sine, MD;  Location: [facility];  Service: Cardiovascular;  Laterality: N/A;   CORONARY ANGIOPLASTY  09/1998   mid-distal RCA balloon dilatation, 4.5 & 5.0 stents    CORONARY ANGIOPLASTY WITH STENT PLACEMENT  03/1994   angioplasty & stenting (non-DES) of circumflex/prox ramus intermedius   CORONARY ANGIOPLASTY WITH STENT PLACEMENT  10/1994   large iliac [id number] stent to RCA   [place]  12/2002   4.70mm stents x2 of RCA   CORONARY ANGIOPLASTY WITH STENT PLACEMENT  01/2005   cutting balloon arthrectomy of distal RCA & Cypher DES 3.5x13; cutting balloon arthrectomy of mid RCA with Cypher DES 3.5x18   CORONARY  ANGIOPLASTY WITH STENT PLACEMENT  11/2008   stenting of mid RCA with 4.0x83mm driver, non-DES   ICD IMPLANT N/A 08/20/2020   Procedure: ICD IMPLANT;  Surgeon: Constance Haw, MD;  Location: [facility];  Service: Cardiovascular;  Laterality: N/A;   INTRAVASCULAR PRESSURE WIRE/FFR STUDY N/A 03/02/2020   Procedure: INTRAVASCULAR PRESSURE WIRE/FFR STUDY;  Surgeon: Leonie Man, MD;  Location: [facility];  Service: Cardiovascular;  Laterality: N/A;   LEFT HEART CATH AND CORONARY ANGIOGRAPHY N/A 03/02/2020   Procedure: LEFT HEART CATH AND CORONARY ANGIOGRAPHY;  Surgeon: Leonie Man, MD;  Location: [facility];  Service: Cardiovascular;  Laterality: N/A;   LEFT HEART CATH AND CORONARY ANGIOGRAPHY N/A 08/19/2020   Procedure: LEFT HEART CATH AND CORONARY ANGIOGRAPHY;  Surgeon: Lorretta Harp, MD;  Location: [facility];  Service: Cardiovascular;  Laterality: N/A;   LEFT HEART CATHETERIZATION WITH CORONARY ANGIOGRAM N/A 02/27/2012   Procedure: LEFT HEART CATHETERIZATION WITH CORONARY ANGIOGRAM;  Surgeon: Lorretta Harp, MD;  Location: [facility] C[facility];  Service: Cardiovascular;  Laterality: N/A;  TRANSTHORACIC ECHOCARDIOGRAM  07/29/2010   EF 50=55%, mod inf wall hypokinesis & mild post wall hypokinesis; LA mild-mod dilated; mild mitral annular calcif & mild MR; mild TR & elevated RV systolic pressure; AV mildly sclerotic; mild aortic root dilatation    V TACH ABLATION N/A 01/20/2021   Procedure: V TACH ABLATION;  Surgeon: Vickie Epley, MD;  Location: [facility];  Service: Cardiovascular;  Laterality: N/A;    Current Outpatient Medications  Medication Sig Dispense Refill   acetaminophen (TYLENOL) 500 MG tablet Take 500 mg by mouth every 6 (six) hours as needed for moderate pain or headache.     albuterol (VENTOLIN HFA) 108 (90 Base) MCG/ACT inhaler Inhale 2 puffs into the lungs every 6 (six) hours as needed for wheezing or shortness of breath. 1 each 6    ELIQUIS 5 MG TABS tablet TAKE 1 TABLET BY MOUTH TWICE A DAY 180 tablet 1   ENTRESTO 24-26 MG TAKE 1 TABLET BY MOUTH TWICE A DAY 60 tablet 6   Fluticasone Furoate (ARNUITY ELLIPTA) 200 MCG/ACT AEPB Inhale 1 puff into the lungs daily. 30 each 6   glucose blood (ONETOUCH ULTRA) test strip USE TO CHECK BLOOD SUGAR 3 TIMES A DAY (E11.9) 100 strip 2   insulin aspart (NOVOLOG FLEXPEN) 100 UNIT/ML FlexPen INJECT 5-20 UNITS SUBCUTANEOUSLY WITH LUNCH AND DINNER 15 mL 1   Insulin Pen Needle (NOVOFINE) 32G X 6 MM MISC 1 each by Other route daily. 100 each 3   JARDIANCE 25 MG TABS tablet TAKE 1 TABLET BY MOUTH EVERY DAY 30 tablet 5   LEVEMIR FLEXTOUCH 100 UNIT/ML FlexPen INJECT 46 UNITS INTO THE SKIN DAILY. DX:E11.9 15 mL 3   metoprolol succinate (TOPROL-XL) 100 MG 24 hr tablet Take 1 tablet (100 mg total) by mouth daily. Take with or immediately following a meal. 90 tablet 1   mexiletine (MEXITIL) 200 MG capsule Take 1 capsule (200 mg total) by mouth every 12 (twelve) hours. 60 capsule 6   montelukast (SINGULAIR) 10 MG tablet TAKE 1 TABLET BY MOUTH EVERY DAY 90 tablet 3   nitroGLYCERIN (NITROSTAT) 0.4 MG SL tablet PLACE 1 TABLET (0.4 MG TOTAL) UNDER THE TONGUE EVERY 5 (FIVE) MINUTES AS NEEDED FOR CHEST PAIN. 25 tablet 1   rosuvastatin (CRESTOR) 20 MG tablet TAKE 1 TABLET BY MOUTH EVERY DAY 90 tablet 3   sotalol (BETAPACE) 160 MG tablet Take 1 tablet (160 mg total) by mouth 2 (two) times daily. 60 tablet 6   No current facility-administered medications for this visit.    Allergies:   Omega-3 fatty acids, Benazepril, and Fish allergy   Social History: Social History   Socioeconomic History   Marital status: Married    Spouse name: Not on file   Number of children: 3   Years of education: Not on file   Highest education level: Not on file  Occupational History   Occupation: Best boy: [employer]    Comment: [place], [place]. [place]  Tobacco Use   Smoking status: Former    Packs/day:  1.00    Years: 50.00    Pack years: 50.00    Types: Cigarettes    Quit date: 07/20/2016    Years since quitting: 4.5   Smokeless tobacco: Never  Vaping Use   Vaping Use: Never used  Substance and Sexual Activity   Alcohol use: Not Currently    Alcohol/week: 0.0 standard drinks   Drug use: No   Sexual activity: Yes  Other Topics Concern   Not on file  Social History Narrative   Not on file   Social Determinants of Health   Financial Resource Strain: Not on file  Food Insecurity: Not on file  Transportation Needs: Not on file  Physical Activity: Not on file  Stress: Not on file  Social Connections: Not on file  Intimate Partner Violence: Not on file    Family History: Family History  Problem Relation Age of Onset   Heart attack Father     Review of Systems: All other systems reviewed and are otherwise negative except as noted above.   Physical Exam: Vitals:   02/01/21 1052  BP: 132/82  Pulse: 74  SpO2: 98%  Weight: 221 lb (100.2 kg)  Height: 6' (1.829 m)     GEN- The patient is well appearing, alert and oriented x 3 today.   HEENT: normocephalic, atraumatic; sclera clear, conjunctiva pink; hearing intact; oropharynx clear; neck supple, no JVP Lymph- no cervical lymphadenopathy Lungs- Clear to ausculation bilaterally, normal work of breathing.  No wheezes, rales, rhonchi Heart- Regular rate and rhythm, no murmurs, rubs or gallops, PMI not laterally displaced GI- soft, non-tender, non-distended, bowel sounds present, no hepatosplenomegaly Extremities- no clubbing or cyanosis. No edema; DP/PT/radial pulses 2+ bilaterally MS- no significant deformity or atrophy Skin- warm and dry, no rash or lesion; ICD pocket well healed Psych- euthymic mood, full affect Neuro- strength and sensation are intact  ICD interrogation- reviewed in detail today,  See PACEART report  EKG:  EKG is ordered today. Personal review of EKG ordered today shows NSR at 74 with stable QTc on  sotalol  Recent Labs: 01/17/2021: ALT 20; B Natriuretic Peptide 169.7 01/18/2021: TSH 0.404 01/26/2021: Hemoglobin 16.6; Magnesium 2.0; Platelets 150 01/27/2021: BUN 12; Creatinine, Ser 1.02; Potassium 4.1; Sodium 140   Wt Readings from Last 3 Encounters:  02/01/21 221 lb (100.2 kg)  01/28/21 217 lb (98.4 kg)  01/21/21 214 lb (97.1 kg)     Other studies Reviewed: Additional studies/ records that were reviewed today include: Previous EP and recent admission notes.   Assessment and Plan:  1.  Ventricular tachycardia s/p Medtronic dual chamber ICD  No further discharge Stable on an appropriate medical regimen Normal ICD function See Pace Art report No changes today If recurrence, John need to transition to amiodarone    2. CAD  Cath 08/2020 this year with stable, non-obstructive disease No anginal symptoms   3. ICM 4. Chronic CHF (systolic) Volume status slightly up today in setting of cantaloupe and watermelon with salt. Also drinks 5-7 bottles of water a day. Encouraged to cut back and aim for more of a 2L fluid intake daily. He is not currently on diuretics.  No SOB BP stable.   5. AFlutter  CHA2DS2Vasc is 5, on Eliquis, Appropriately dosed. Denies bleeding  Current medicines are reviewed at length with the patient today.   The patient does not have concerns regarding his medicines.  The following changes were made today:  none  Labs/ tests ordered today include:  Orders Placed This Encounter  Procedures   Comprehensive metabolic panel   CBC   EKG 12-Lead    Disposition:   Follow up with Dr. Curt Bears  6-8 weeks  Signed, Shirley Friar, PA-C  02/01/2021 11:25 AM  [facility] [address] [place] [address] [phone] (office) [phone] (fax)

## 2021-01-31 NOTE — Telephone Encounter (Signed)
Pt's insurance card is not scanned into Epic so I cannot determine any copay related info for mexiletine through his insurance. It is available for $45-47 per month using GoodRx coupon at either [employer] or [facility].

## 2021-01-31 NOTE — Telephone Encounter (Addendum)
**Note De-Identified Ardis Fullwood Obfuscation** [facility] and was advised by the pharmacist that the pt picked up his [name] on Friday 08/12 and paid $49.52 using a GoodRx card. No PA required.

## 2021-02-01 ENCOUNTER — Encounter: Payer: Self-pay | Admitting: Student

## 2021-02-01 ENCOUNTER — Ambulatory Visit (INDEPENDENT_AMBULATORY_CARE_PROVIDER_SITE_OTHER): Payer: Medicare Other | Admitting: Student

## 2021-02-01 ENCOUNTER — Other Ambulatory Visit: Payer: Self-pay

## 2021-02-01 VITALS — BP 132/82 | HR 74 | Ht 72.0 in | Wt 221.0 lb

## 2021-02-01 DIAGNOSIS — I472 Ventricular tachycardia, unspecified: Secondary | ICD-10-CM

## 2021-02-01 DIAGNOSIS — I255 Ischemic cardiomyopathy: Secondary | ICD-10-CM | POA: Diagnosis not present

## 2021-02-01 DIAGNOSIS — I4892 Unspecified atrial flutter: Secondary | ICD-10-CM

## 2021-02-01 DIAGNOSIS — Z79899 Other long term (current) drug therapy: Secondary | ICD-10-CM

## 2021-02-01 LAB — CUP PACEART INCLINIC DEVICE CHECK
Battery Remaining Longevity: 112 mo
Battery Voltage: 3.02 V
Brady Statistic AP VP Percent: 11.05 %
Brady Statistic AP VS Percent: 87.51 %
Brady Statistic AS VP Percent: 0.6 %
Brady Statistic AS VS Percent: 0.84 %
Brady Statistic RA Percent Paced: 97.13 %
Brady Statistic RV Percent Paced: 12.31 %
Date Time Interrogation Session: 20220816112556
HighPow Impedance: 73 Ohm
Implantable Lead Implant Date: 20220304
Implantable Lead Implant Date: 20220304
Implantable Lead Location: 753859
Implantable Lead Location: 753860
Implantable Lead Model: 5076
Implantable Pulse Generator Implant Date: 20220304
Lead Channel Impedance Value: 494 Ohm
Lead Channel Impedance Value: 513 Ohm
Lead Channel Impedance Value: 532 Ohm
Lead Channel Pacing Threshold Amplitude: 0.625 V
Lead Channel Pacing Threshold Amplitude: 0.625 V
Lead Channel Pacing Threshold Pulse Width: 0.4 ms
Lead Channel Pacing Threshold Pulse Width: 0.4 ms
Lead Channel Sensing Intrinsic Amplitude: 23.25 mV
Lead Channel Sensing Intrinsic Amplitude: 3.625 mV
Lead Channel Sensing Intrinsic Amplitude: 30.25 mV
Lead Channel Sensing Intrinsic Amplitude: 4.5 mV
Lead Channel Setting Pacing Amplitude: 1.5 V
Lead Channel Setting Pacing Amplitude: 2 V
Lead Channel Setting Pacing Pulse Width: 0.4 ms
Lead Channel Setting Sensing Sensitivity: 0.3 mV

## 2021-02-01 NOTE — Patient Instructions (Signed)
Medication Instructions:  Your physician recommends that you continue on your current medications as directed. Please refer to the Current Medication list given to you today.  *If you need a refill on your cardiac medications before your next appointment, please call your pharmacy*   Lab Work: TODAY: CMET, CBC  If you have labs (blood work) drawn today and your tests are completely normal, you will receive your results only by: [place] (if you have MyChart) OR A paper copy in the mail If you have any lab test that is abnormal or we need to change your treatment, we will call you to review the results.  Follow-Up: At [facility], you and your health needs are our priority.  As part of our continuing mission to provide you with exceptional heart care, we have created designated Provider Care Teams.  These Care Teams include your primary Cardiologist (physician) and Advanced Practice Providers (APPs -  Physician Assistants and Nurse Practitioners) who all work together to provide you with the care you need, when you need it.   Your next appointment:   As scheduled

## 2021-02-02 LAB — COMPREHENSIVE METABOLIC PANEL
ALT: 16 IU/L (ref 0–44)
AST: 18 IU/L (ref 0–40)
Albumin/Globulin Ratio: 2.1 (ref 1.2–2.2)
Albumin: 4 g/dL (ref 3.7–4.7)
Alkaline Phosphatase: 91 IU/L (ref 44–121)
BUN/Creatinine Ratio: 14 (ref 10–24)
BUN: 12 mg/dL (ref 8–27)
Bilirubin Total: 0.6 mg/dL (ref 0.0–1.2)
CO2: 24 mmol/L (ref 20–29)
Calcium: 8.7 mg/dL (ref 8.6–10.2)
Chloride: 106 mmol/L (ref 96–106)
Creatinine, Ser: 0.83 mg/dL (ref 0.76–1.27)
Globulin, Total: 1.9 g/dL (ref 1.5–4.5)
Glucose: 226 mg/dL — ABNORMAL HIGH (ref 65–99)
Potassium: 4.2 mmol/L (ref 3.5–5.2)
Sodium: 142 mmol/L (ref 134–144)
Total Protein: 5.9 g/dL — ABNORMAL LOW (ref 6.0–8.5)
eGFR: 93 mL/min/{1.73_m2} (ref >59)

## 2021-02-02 LAB — CBC
Hematocrit: 49.2 % (ref 37.5–51.0)
Hemoglobin: 16.2 g/dL (ref 13.0–17.7)
MCH: 29.6 pg (ref 26.6–33.0)
MCHC: 32.9 g/dL (ref 31.5–35.7)
MCV: 90 fL (ref 79–97)
Platelets: 161 10*3/uL (ref 150–450)
RBC: 5.47 x10E6/uL (ref 4.14–5.80)
RDW: 13 % (ref 11.6–15.4)
WBC: 8.1 10*3/uL (ref 3.4–10.8)

## 2021-02-03 ENCOUNTER — Ambulatory Visit: Payer: Medicare Other | Admitting: Sports Medicine

## 2021-02-04 ENCOUNTER — Encounter: Payer: Medicare Other | Admitting: Student

## 2021-02-04 ENCOUNTER — Other Ambulatory Visit: Payer: Self-pay | Admitting: Podiatry

## 2021-02-04 ENCOUNTER — Ambulatory Visit (INDEPENDENT_AMBULATORY_CARE_PROVIDER_SITE_OTHER): Payer: Medicare Other

## 2021-02-04 ENCOUNTER — Other Ambulatory Visit: Payer: Self-pay

## 2021-02-04 ENCOUNTER — Ambulatory Visit (INDEPENDENT_AMBULATORY_CARE_PROVIDER_SITE_OTHER): Payer: Medicare Other | Admitting: Podiatry

## 2021-02-04 DIAGNOSIS — L03032 Cellulitis of left toe: Secondary | ICD-10-CM | POA: Diagnosis not present

## 2021-02-04 DIAGNOSIS — L97522 Non-pressure chronic ulcer of other part of left foot with fat layer exposed: Secondary | ICD-10-CM

## 2021-02-04 DIAGNOSIS — M869 Osteomyelitis, unspecified: Secondary | ICD-10-CM

## 2021-02-04 DIAGNOSIS — I255 Ischemic cardiomyopathy: Secondary | ICD-10-CM

## 2021-02-04 MED ORDER — DOXYCYCLINE HYCLATE 100 MG PO TABS: 100.0000 mg | ORAL_TABLET | Freq: Two times a day (BID) | ORAL | 0 refills | Status: DC

## 2021-02-04 NOTE — Progress Notes (Signed)
   Subjective:  70 y.o. male with PMHx of diabetes mellitus presenting to the office today for recurrence of an ulcer that developed to the medial aspect of the left second toe.  Patient states he was doing well until a few days ago when he began to notice an ulcer and redness and swelling to the toe.  He has history of recurrent ulcers to this area.  He presents for further treatment and evaluation  Past Medical History:  Diagnosis Date   Atrial flutter ([place])    s/p cardioversion   Coronary artery disease    Diabetes mellitus    GERD (gastroesophageal reflux disease)    History of nuclear stress test 04/04/2011   lexiscan; mod-large in size fixed inferolateral defect (scar); non-diagnostic for ischemia; low risk scan    Hyperlipidemia    Hypertension    Left foot drop    r/t past disk srugery - uses Kevlar brace   Myocardial infarction ([facility])    posterior MI   Shortness of breath    Sleep apnea    on CPAP; 04/28/2007 split-night - AHI during total sleep 44.43/hr and REM 72.56/hr    Objective/Physical Exam General: The patient is alert and oriented x3 in no acute distress.  Dermatology:  Wound #1 noted to the medial aspect of the left second toe measuring approximately 0.6 x 0.6 x 0.3 cm (LxWxD).   To the noted ulceration(s), there is no eschar. There is a moderate amount of slough, fibrin, and necrotic tissue noted.  There is exposed bone to the toe.  Upon debridement   vascular: Palpable pedal pulses bilaterally.  Edema with erythema noted encompassing the second toe  Neurological: Epicritic and protective threshold diminished bilaterally.   Musculoskeletal Exam: Hallux valgus deformity noted  Radiographic exam: Normal osseous mineralization.  No osteolytic process noted around the second toe  Assessment: 1.  Ulcer left second toe secondary to diabetes mellitus 2. diabetes mellitus w/ peripheral neuropathy   Plan of Care:  1. Patient was evaluated.   2.  Medically  necessary excisional debridement including subcutaneous tissue was performed using a tissue nipper.  Excisional debridement of all necrotic nonviable tissue down to healthy bleeding viable tissue was performed with postdebridement measurement same as pre- 3.  Recommend Betadine ointment and a light dressing daily.  Betadine ointment provided 4.  The patient has now had recurrent ulcers to the same area and now there is a bone exposed.  Today were going to order CT with contrast to determine the amount of any osteomyelitis in the area.  Patient will likely need surgical toe amputation since there is exposed bone he has a history of recurrent ulcers to this exact area 5.  Cultures taken and sent to pathology for culture and sensitivity 6.  Prescription for doxycycline 100 mg 2 times daily #20 7.  Return to clinic in 2 weeks to review CT results and likely surgical consult of the second toe  Edrick Kins, DPM [facility]  Dr. Edrick Kins, DPM    [address], [place] [address]                Office [phone]  Fax [phone]

## 2021-02-08 ENCOUNTER — Emergency Department (HOSPITAL_COMMUNITY): Payer: Medicare Other

## 2021-02-08 ENCOUNTER — Inpatient Hospital Stay (HOSPITAL_COMMUNITY)
Admission: EM | Admit: 2021-02-08 | Discharge: 2021-02-11 | DRG: 617 | Disposition: A | Discharge status: Home / Self Care | Payer: Medicare Other | Attending: Internal Medicine | Admitting: Internal Medicine

## 2021-02-08 ENCOUNTER — Other Ambulatory Visit: Payer: Self-pay

## 2021-02-08 ENCOUNTER — Ambulatory Visit (INDEPENDENT_AMBULATORY_CARE_PROVIDER_SITE_OTHER): Payer: Medicare Other | Admitting: Podiatry

## 2021-02-08 DIAGNOSIS — Z955 Presence of coronary angioplasty implant and graft: Secondary | ICD-10-CM | POA: Diagnosis not present

## 2021-02-08 DIAGNOSIS — M86672 Other chronic osteomyelitis, left ankle and foot: Secondary | ICD-10-CM | POA: Diagnosis not present

## 2021-02-08 DIAGNOSIS — E1143 Type 2 diabetes mellitus with diabetic autonomic (poly)neuropathy: Secondary | ICD-10-CM

## 2021-02-08 DIAGNOSIS — Z95810 Presence of automatic (implantable) cardiac defibrillator: Secondary | ICD-10-CM

## 2021-02-08 DIAGNOSIS — G4733 Obstructive sleep apnea (adult) (pediatric): Secondary | ICD-10-CM | POA: Diagnosis not present

## 2021-02-08 DIAGNOSIS — L089 Local infection of the skin and subcutaneous tissue, unspecified: Secondary | ICD-10-CM

## 2021-02-08 DIAGNOSIS — M869 Osteomyelitis, unspecified: Secondary | ICD-10-CM | POA: Diagnosis not present

## 2021-02-08 DIAGNOSIS — M19072 Primary osteoarthritis, left ankle and foot: Secondary | ICD-10-CM | POA: Diagnosis not present

## 2021-02-08 DIAGNOSIS — M868X7 Other osteomyelitis, ankle and foot: Secondary | ICD-10-CM | POA: Diagnosis present

## 2021-02-08 DIAGNOSIS — E11621 Type 2 diabetes mellitus with foot ulcer: Secondary | ICD-10-CM | POA: Diagnosis present

## 2021-02-08 DIAGNOSIS — R609 Edema, unspecified: Secondary | ICD-10-CM | POA: Diagnosis not present

## 2021-02-08 DIAGNOSIS — E785 Hyperlipidemia, unspecified: Secondary | ICD-10-CM | POA: Diagnosis present

## 2021-02-08 DIAGNOSIS — E119 Type 2 diabetes mellitus without complications: Secondary | ICD-10-CM

## 2021-02-08 DIAGNOSIS — B951 Streptococcus, group B, as the cause of diseases classified elsewhere: Secondary | ICD-10-CM | POA: Diagnosis present

## 2021-02-08 DIAGNOSIS — I10 Essential (primary) hypertension: Secondary | ICD-10-CM | POA: Diagnosis not present

## 2021-02-08 DIAGNOSIS — K219 Gastro-esophageal reflux disease without esophagitis: Secondary | ICD-10-CM | POA: Diagnosis present

## 2021-02-08 DIAGNOSIS — I472 Ventricular tachycardia: Secondary | ICD-10-CM | POA: Diagnosis present

## 2021-02-08 DIAGNOSIS — G629 Polyneuropathy, unspecified: Secondary | ICD-10-CM | POA: Diagnosis present

## 2021-02-08 DIAGNOSIS — I252 Old myocardial infarction: Secondary | ICD-10-CM | POA: Diagnosis not present

## 2021-02-08 DIAGNOSIS — Z888 Allergy status to other drugs, medicaments and biological substances status: Secondary | ICD-10-CM

## 2021-02-08 DIAGNOSIS — Z8249 Family history of ischemic heart disease and other diseases of the circulatory system: Secondary | ICD-10-CM

## 2021-02-08 DIAGNOSIS — L03032 Cellulitis of left toe: Secondary | ICD-10-CM

## 2021-02-08 DIAGNOSIS — I2510 Atherosclerotic heart disease of native coronary artery without angina pectoris: Secondary | ICD-10-CM | POA: Diagnosis present

## 2021-02-08 DIAGNOSIS — I110 Hypertensive heart disease with heart failure: Secondary | ICD-10-CM | POA: Diagnosis present

## 2021-02-08 DIAGNOSIS — Z87891 Personal history of nicotine dependence: Secondary | ICD-10-CM | POA: Diagnosis not present

## 2021-02-08 DIAGNOSIS — I4892 Unspecified atrial flutter: Secondary | ICD-10-CM | POA: Diagnosis present

## 2021-02-08 DIAGNOSIS — E1169 Type 2 diabetes mellitus with other specified complication: Secondary | ICD-10-CM | POA: Diagnosis present

## 2021-02-08 DIAGNOSIS — L0390 Cellulitis, unspecified: Secondary | ICD-10-CM | POA: Diagnosis not present

## 2021-02-08 DIAGNOSIS — J449 Chronic obstructive pulmonary disease, unspecified: Secondary | ICD-10-CM | POA: Diagnosis present

## 2021-02-08 DIAGNOSIS — I5022 Chronic systolic (congestive) heart failure: Secondary | ICD-10-CM | POA: Diagnosis not present

## 2021-02-08 DIAGNOSIS — L97929 Non-pressure chronic ulcer of unspecified part of left lower leg with unspecified severity: Secondary | ICD-10-CM | POA: Diagnosis present

## 2021-02-08 DIAGNOSIS — Z91013 Allergy to seafood: Secondary | ICD-10-CM

## 2021-02-08 DIAGNOSIS — E11628 Type 2 diabetes mellitus with other skin complications: Secondary | ICD-10-CM | POA: Diagnosis not present

## 2021-02-08 DIAGNOSIS — L03116 Cellulitis of left lower limb: Secondary | ICD-10-CM | POA: Diagnosis present

## 2021-02-08 DIAGNOSIS — R600 Localized edema: Secondary | ICD-10-CM | POA: Diagnosis not present

## 2021-02-08 DIAGNOSIS — G4730 Sleep apnea, unspecified: Secondary | ICD-10-CM | POA: Diagnosis present

## 2021-02-08 DIAGNOSIS — M7989 Other specified soft tissue disorders: Secondary | ICD-10-CM | POA: Diagnosis not present

## 2021-02-08 DIAGNOSIS — E876 Hypokalemia: Secondary | ICD-10-CM | POA: Diagnosis not present

## 2021-02-08 DIAGNOSIS — Z7901 Long term (current) use of anticoagulants: Secondary | ICD-10-CM

## 2021-02-08 DIAGNOSIS — Z20822 Contact with and (suspected) exposure to covid-19: Secondary | ICD-10-CM | POA: Diagnosis present

## 2021-02-08 DIAGNOSIS — I25118 Atherosclerotic heart disease of native coronary artery with other forms of angina pectoris: Secondary | ICD-10-CM | POA: Diagnosis not present

## 2021-02-08 DIAGNOSIS — Z794 Long term (current) use of insulin: Secondary | ICD-10-CM | POA: Diagnosis not present

## 2021-02-08 DIAGNOSIS — Z8679 Personal history of other diseases of the circulatory system: Secondary | ICD-10-CM | POA: Diagnosis not present

## 2021-02-08 DIAGNOSIS — Z9989 Dependence on other enabling machines and devices: Secondary | ICD-10-CM | POA: Diagnosis not present

## 2021-02-08 DIAGNOSIS — Z09 Encounter for follow-up examination after completed treatment for conditions other than malignant neoplasm: Secondary | ICD-10-CM

## 2021-02-08 DIAGNOSIS — Z79899 Other long term (current) drug therapy: Secondary | ICD-10-CM

## 2021-02-08 DIAGNOSIS — M86172 Other acute osteomyelitis, left ankle and foot: Secondary | ICD-10-CM | POA: Diagnosis not present

## 2021-02-08 LAB — CBC WITH DIFFERENTIAL/PLATELET
Abs Immature Granulocytes: 0.07 10*3/uL (ref 0.00–0.07)
Basophils Absolute: 0.1 10*3/uL (ref 0.0–0.1)
Basophils Relative: 1 %
Eosinophils Absolute: 0.2 10*3/uL (ref 0.0–0.5)
Eosinophils Relative: 2 %
HCT: 53.5 % — ABNORMAL HIGH (ref 39.0–52.0)
Hemoglobin: 17.3 g/dL — ABNORMAL HIGH (ref 13.0–17.0)
Immature Granulocytes: 1 %
Lymphocytes Relative: 11 %
Lymphs Abs: 1.1 10*3/uL (ref 0.7–4.0)
MCH: 30.6 pg (ref 26.0–34.0)
MCHC: 32.3 g/dL (ref 30.0–36.0)
MCV: 94.7 fL (ref 80.0–100.0)
Monocytes Absolute: 0.6 10*3/uL (ref 0.1–1.0)
Monocytes Relative: 6 %
Neutro Abs: 8.4 10*3/uL — ABNORMAL HIGH (ref 1.7–7.7)
Neutrophils Relative %: 79 %
Platelets: 194 10*3/uL (ref 150–400)
RBC: 5.65 MIL/uL (ref 4.22–5.81)
RDW: 13.7 % (ref 11.5–15.5)
WBC: 10.5 10*3/uL (ref 4.0–10.5)
nRBC: 0 % (ref 0.0–0.2)

## 2021-02-08 LAB — COMPREHENSIVE METABOLIC PANEL
ALT: 20 U/L (ref 0–44)
AST: 20 U/L (ref 15–41)
Albumin: 3.3 g/dL — ABNORMAL LOW (ref 3.5–5.0)
Alkaline Phosphatase: 87 U/L (ref 38–126)
Anion gap: 7 (ref 5–15)
BUN: 13 mg/dL (ref 8–23)
CO2: 26 mmol/L (ref 22–32)
Calcium: 9.2 mg/dL (ref 8.9–10.3)
Chloride: 108 mmol/L (ref 98–111)
Creatinine, Ser: 1.06 mg/dL (ref 0.61–1.24)
GFR, Estimated: >60 mL/min (ref >60)
Glucose, Bld: 218 mg/dL — ABNORMAL HIGH (ref 70–99)
Potassium: 4 mmol/L (ref 3.5–5.1)
Sodium: 141 mmol/L (ref 135–145)
Total Bilirubin: 1 mg/dL (ref 0.3–1.2)
Total Protein: 6.1 g/dL — ABNORMAL LOW (ref 6.5–8.1)

## 2021-02-08 LAB — HEMOGLOBIN A1C
Hgb A1c MFr Bld: 8 % — ABNORMAL HIGH (ref 4.8–5.6)
Mean Plasma Glucose: 182.9 mg/dL

## 2021-02-08 LAB — SEDIMENTATION RATE: Sed Rate: 4 mm/hr (ref 0–16)

## 2021-02-08 LAB — RESP PANEL BY RT-PCR (FLU A&B, COVID) ARPGX2
Influenza A by PCR: NEGATIVE
Influenza B by PCR: NEGATIVE
SARS Coronavirus 2 by RT PCR: NEGATIVE

## 2021-02-08 LAB — LACTIC ACID, PLASMA
Lactic Acid, Venous: 1.2 mmol/L (ref 0.5–1.9)
Lactic Acid, Venous: 2.5 mmol/L (ref 0.5–1.9)

## 2021-02-08 LAB — CBG MONITORING, ED: Glucose-Capillary: 237 mg/dL — ABNORMAL HIGH (ref 70–99)

## 2021-02-08 LAB — PREALBUMIN: Prealbumin: 18.4 mg/dL (ref 18–38)

## 2021-02-08 LAB — C-REACTIVE PROTEIN: CRP: 1.9 mg/dL — ABNORMAL HIGH (ref <1.0)

## 2021-02-08 MED ORDER — ONDANSETRON HCL 4 MG/2ML IJ SOLN: 4.0000 mg | Freq: Four times a day (QID) | INTRAMUSCULAR | Status: DC | PRN

## 2021-02-08 MED ORDER — ROSUVASTATIN CALCIUM 20 MG PO TABS
20.0000 mg | ORAL_TABLET | Freq: Every day | ORAL | Status: DC
  Administered 2021-02-09 – 2021-02-11 (×3): 20 mg via ORAL
  Filled 2021-02-08 (×3): qty 1

## 2021-02-08 MED ORDER — METRONIDAZOLE 500 MG/100ML IV SOLN
500.0000 mg | Freq: Three times a day (TID) | INTRAVENOUS | Status: DC
  Administered 2021-02-08 – 2021-02-09 (×2): 500 mg via INTRAVENOUS
  Filled 2021-02-08 (×2): qty 100

## 2021-02-08 MED ORDER — INSULIN DETEMIR 100 UNIT/ML FLEXPEN: 46.0000 [IU] | PEN_INJECTOR | Freq: Every day | SUBCUTANEOUS | Status: DC

## 2021-02-08 MED ORDER — SODIUM CHLORIDE 0.9 % IV SOLN
2.0000 g | INTRAVENOUS | Status: DC
  Administered 2021-02-08 – 2021-02-10 (×3): 2 g via INTRAVENOUS
  Filled 2021-02-08 (×3): qty 20

## 2021-02-08 MED ORDER — APIXABAN 5 MG PO TABS
5.0000 mg | ORAL_TABLET | Freq: Two times a day (BID) | ORAL | Status: DC
  Administered 2021-02-08 – 2021-02-09 (×3): 5 mg via ORAL
  Filled 2021-02-08 (×3): qty 1

## 2021-02-08 MED ORDER — DIPHENHYDRAMINE HCL 50 MG/ML IJ SOLN
25.0000 mg | Freq: Once | INTRAMUSCULAR | Status: AC
  Administered 2021-02-08: 25 mg via INTRAVENOUS
  Filled 2021-02-08: qty 1

## 2021-02-08 MED ORDER — INSULIN ASPART 100 UNIT/ML IJ SOLN
4.0000 [IU] | Freq: Three times a day (TID) | INTRAMUSCULAR | Status: DC
  Administered 2021-02-09 (×3): 4 [IU] via SUBCUTANEOUS

## 2021-02-08 MED ORDER — SACUBITRIL-VALSARTAN 24-26 MG PO TABS
1.0000 | ORAL_TABLET | Freq: Two times a day (BID) | ORAL | Status: DC
  Administered 2021-02-08 – 2021-02-11 (×6): 1 via ORAL
  Filled 2021-02-08 (×7): qty 1

## 2021-02-08 MED ORDER — ACETAMINOPHEN 650 MG RE SUPP: 650.0000 mg | Freq: Four times a day (QID) | RECTAL | Status: DC | PRN

## 2021-02-08 MED ORDER — EMPAGLIFLOZIN 25 MG PO TABS
25.0000 mg | ORAL_TABLET | Freq: Every day | ORAL | Status: DC
  Administered 2021-02-08 – 2021-02-10 (×3): 25 mg via ORAL
  Filled 2021-02-08 (×4): qty 1

## 2021-02-08 MED ORDER — INSULIN ASPART 100 UNIT/ML IJ SOLN
0.0000 [IU] | Freq: Three times a day (TID) | INTRAMUSCULAR | Status: DC
  Administered 2021-02-09: 3 [IU] via SUBCUTANEOUS
  Administered 2021-02-09 – 2021-02-10 (×2): 2 [IU] via SUBCUTANEOUS
  Administered 2021-02-11: 5 [IU] via SUBCUTANEOUS
  Administered 2021-02-11: 2 [IU] via SUBCUTANEOUS

## 2021-02-08 MED ORDER — ALBUTEROL SULFATE HFA 108 (90 BASE) MCG/ACT IN AERS
2.0000 | INHALATION_SPRAY | Freq: Four times a day (QID) | RESPIRATORY_TRACT | Status: DC | PRN
  Filled 2021-02-08: qty 6.7

## 2021-02-08 MED ORDER — MONTELUKAST SODIUM 10 MG PO TABS
10.0000 mg | ORAL_TABLET | Freq: Every day | ORAL | Status: DC
  Administered 2021-02-09 – 2021-02-11 (×3): 10 mg via ORAL
  Filled 2021-02-08 (×3): qty 1

## 2021-02-08 MED ORDER — MEXILETINE HCL 200 MG PO CAPS
200.0000 mg | ORAL_CAPSULE | Freq: Two times a day (BID) | ORAL | Status: DC
  Administered 2021-02-08 – 2021-02-09 (×2): 200 mg via ORAL
  Filled 2021-02-08 (×3): qty 1

## 2021-02-08 MED ORDER — SOTALOL HCL 80 MG PO TABS
160.0000 mg | ORAL_TABLET | Freq: Two times a day (BID) | ORAL | Status: DC
  Administered 2021-02-08 – 2021-02-11 (×6): 160 mg via ORAL
  Filled 2021-02-08 (×7): qty 2

## 2021-02-08 MED ORDER — INSULIN GLARGINE-YFGN 100 UNIT/ML ~~LOC~~ SOLN
40.0000 [IU] | Freq: Every day | SUBCUTANEOUS | Status: DC
  Filled 2021-02-08 (×3): qty 0.4

## 2021-02-08 MED ORDER — ROSUVASTATIN CALCIUM 20 MG PO TABS
20.0000 mg | ORAL_TABLET | Freq: Every day | ORAL | Status: DC
Start: 2021-02-08 — End: 2021-02-08

## 2021-02-08 MED ORDER — EMPAGLIFLOZIN 25 MG PO TABS: 25.0000 mg | ORAL_TABLET | Freq: Every day | ORAL | Status: DC

## 2021-02-08 MED ORDER — ONDANSETRON HCL 4 MG PO TABS: 4.0000 mg | ORAL_TABLET | Freq: Four times a day (QID) | ORAL | Status: DC | PRN

## 2021-02-08 MED ORDER — MONTELUKAST SODIUM 10 MG PO TABS: 10.0000 mg | ORAL_TABLET | Freq: Every day | ORAL | Status: DC

## 2021-02-08 MED ORDER — FAMOTIDINE IN NACL 20-0.9 MG/50ML-% IV SOLN
20.0000 mg | Freq: Once | INTRAVENOUS | Status: AC
  Administered 2021-02-08: 20 mg via INTRAVENOUS
  Filled 2021-02-08: qty 50

## 2021-02-08 MED ORDER — VANCOMYCIN HCL 10 G IV SOLR
2250.0000 mg | INTRAVENOUS | Status: DC
  Administered 2021-02-08: 2250 mg via INTRAVENOUS
  Filled 2021-02-08: qty 45
  Filled 2021-02-08: qty 22.5

## 2021-02-08 MED ORDER — METOPROLOL SUCCINATE ER 50 MG PO TB24
100.0000 mg | ORAL_TABLET | Freq: Every day | ORAL | Status: DC
  Administered 2021-02-08 – 2021-02-10 (×3): 100 mg via ORAL
  Filled 2021-02-08: qty 2
  Filled 2021-02-08: qty 4
  Filled 2021-02-08: qty 2

## 2021-02-08 MED ORDER — FLUTICASONE PROPIONATE HFA 220 MCG/ACT IN AERO: 1.0000 | INHALATION_SPRAY | Freq: Two times a day (BID) | RESPIRATORY_TRACT | Status: DC

## 2021-02-08 MED ORDER — FLUTICASONE FUROATE 200 MCG/ACT IN AEPB: 1.0000 | INHALATION_SPRAY | Freq: Every day | RESPIRATORY_TRACT | Status: DC

## 2021-02-08 MED ORDER — ACETAMINOPHEN 325 MG PO TABS: 650.0000 mg | ORAL_TABLET | Freq: Four times a day (QID) | ORAL | Status: DC | PRN

## 2021-02-08 NOTE — ED Provider Notes (Signed)
Emergency Medicine Provider Triage Evaluation Note  John [name] , a 70 y.o. male  was evaluated in triage.  Pt complains of sent here by podiatrist for consideration of MRI and admission for IV antibiotics. Patient has been on antibiotics since Friday for his left foot being swollen and painful.  He states that his symptoms have been worse.  His foot is more swollen.  He denies any fevers and otherwise feels well.  He states that he has a chronic wound on his toe.  Patient does have a pacemaker.   Review of Systems  Positive: Foot pain and swelling Negative: Fevers, myalgias  Physical Exam  BP 125/85   Pulse 75   Temp 98.2 F (36.8 C) (Oral)   Resp 16   SpO2 97%  Gen:   Awake, no distress   Resp:  Normal effort  MSK:   Moves extremities without difficulty  Other:  Obvious edema of the left foot.  Dressing present on toe of left foot.  Medical Decision Making  Medically screening exam initiated at 1:03 PM.  Appropriate orders placed.  John [name] was informed that the remainder of the evaluation will be completed by another provider, this initial triage assessment does not replace that evaluation, and the importance of remaining in the ED until their evaluation is complete.  Note: Portions of this report may have been transcribed using voice recognition software. Every effort was made to ensure accuracy; however, inadvertent computerized transcription errors may be present    Lorin Glass, PA-C 02/08/21 1304    Malvin Johns, MD 02/08/21 1450

## 2021-02-08 NOTE — Progress Notes (Addendum)
  Subjective:  Patient ID: John [name], male    DOB: May 28, 1951,  MRN: [id number]  Chief Complaint  Patient presents with   Foot Ulcer       urgent work in, bleeding open wound -per amanda    70 y.o. male presents with the above complaint. History confirmed with patient.  He is diabetic and has been working on a chronic left second toe ulcer for some time with Dr. Amalia Hailey.  He saw him last week wound had worsened some and was red and draining a wound culture was taken and he was placed on doxycycline.  He has been taking it since Friday.  He and his wife state since the last day or 2 its continued to get worse and the redness has spread.  Objective:  Physical Exam: warm, good capillary refill, and normal DP and PT pulses.  Chronic ulceration medial second toe with cellulitis to the MTPJ with drainage Assessment:   1. Diabetic infection of left foot ([place])   2. Osteomyelitis of second toe of left foot (H[facility])   3. Cellulitis of second toe, left      Plan:  Patient was evaluated and treated and all questions answered.  Unfortunate has continued to worsen even on oral antibiotics and has developed a diabetic foot infection.  I recommend admission to the hospital for IV antibiotics for cellulitis and MRI to evaluate for the presence of osteomyelitis.  We discussed that he may be at high risk of amputation of the toe which he understands.  They will go to the [facility], ER today.  I notified the charge nurse at the ER that they can expect him.  Plan for admission to hospitalist.  I also notify Dr. Amalia Hailey who is on-call for podiatry. No follow-ups on file.

## 2021-02-08 NOTE — ED Provider Notes (Signed)
[name] EMERGENCY DEPARTMENT Provider Note   CSN: [id number] Arrival date & time: 02/08/21  1215     History No chief complaint on file.   John [name] is a 70 y.o. male.  With a past medical history of diabetes, hypertension, hyperlipidemia, status post pacemaker placement who presents emergency department for evaluation of left toe infection.  Patient states that he has had a small area where his toenail is rubbing against the second digit on the left foot for some time.  He is followed by podiatry.  Last week he developed an infection in the foot and was started on doxycycline.  He has taken has been taking it for the past 5 days.  Unfortunately he has had progressive worsening of the infection in the toe now progressing up the foot and ankle with erythema and swelling.  Pain is minimal to moderate but likely less so due to some neuropathy.  Patient was seen by his podiatrist today and sent to the emergency department with concern for a cellulitis and osteomyelitis with outpatient antibiotic failure.  Patient denies fever, chills.  HPI     Past Medical History:  Diagnosis Date   Atrial flutter ([place])    s/p cardioversion   Coronary artery disease    Diabetes mellitus    GERD (gastroesophageal reflux disease)    History of nuclear stress test 04/04/2011   lexiscan; mod-large in size fixed inferolateral defect (scar); non-diagnostic for ischemia; low risk scan    Hyperlipidemia    Hypertension    Left foot drop    r/t past disk srugery - uses Kevlar brace   Myocardial infarction ([place])    posterior MI   Shortness of breath    Sleep apnea    on CPAP; 04/28/2007 split-night - AHI during total sleep 44.43/hr and REM 72.56/hr    Patient Active Problem List   Diagnosis Date Noted   VT (ventricular tachycardia) ([place]) 01/26/2021   Coagulation defect ([place]) 12/22/2020   Non-ST elevation (NSTEMI) myocardial infarction [facility])    Ventricular tachycardia ([place])  08/18/2020   Capsulitis 05/21/2020   Elevated troponin    Hyperglycemia due to diabetes mellitus ([place]) 02/29/2020   Thoracic aortic aneurysm ([place]) 02/29/2020   Hypotensive episode 02/29/2020   Obesity (BMI 30.0-34.9) 02/29/2020   Lung nodule 02/19/2020   Former smoker 02/19/2020   Healthcare maintenance 02/19/2020   Acoustic neuroma ([place]) 09/11/2017   Asymmetrical sensorineural hearing loss 09/11/2017   Encounter for cardioversion procedure    Anticoagulation adequate 06/06/2016   Fatigue 10/27/2015   Bradycardia 10/27/2015   Hyperlipidemia LDL goal <70 05/20/2015   OSA on CPAP 07/23/2013   Excessive daytime sleepiness 07/23/2013   Hypertension    Hyperlipidemia    Diabetes mellitus    Chest pain 08/18/2012   Tobacco abuse 08/18/2012   Unstable angina, cath [facility] 02/27/12 02/26/2012   CAD, multiple prior RCA PCI's. Last cath 2010, Myoview low risk Oct 2012 02/26/2012   DM type 2, goal HbA1c < 7% (HCC) 02/26/2012   HTN (hypertension) 02/26/2012   Dyslipidemia, low HDL. Intol to fish oil and Noacin 02/26/2012   Sleep apnea, on C-pap 02/26/2012   COPD (chronic obstructive pulmonary disease) ([place]) 02/26/2012   Smoking, quit one week ago 02/26/2012   GERD (gastroesophageal reflux disease) 02/26/2012    Past Surgical History:  Procedure Laterality Date   [place]  2010   6 stents total   CARDIAC CATHETERIZATION  01/2000  percutaneous transluminal coronary balloon angioplasty of mid RCA stenotic lesion   CARDIAC CATHETERIZATION  06/2006   no stenting; ischemic cardiomyopathy, EF 40-45%   CARDIOVERSION N/A 07/28/2016   Procedure: CARDIOVERSION;  Surgeon: Troy Sine, MD;  Location: [place] [facility];  Service: Cardiovascular;  Laterality: N/A;   CORONARY ANGIOPLASTY  09/1998   mid-distal RCA balloon dilatation, 4.5 & 5.0 stents    CORONARY ANGIOPLASTY WITH STENT PLACEMENT  03/1994   angioplasty & stenting (non-DES) of circumflex/prox ramus  intermedius   CORONARY ANGIOPLASTY WITH STENT PLACEMENT  10/1994   large iliac [id number] stent to RCA   [place]  12/2002   4.1mm stents x2 of RCA   CORONARY ANGIOPLASTY WITH STENT PLACEMENT  01/2005   cutting balloon arthrectomy of distal RCA & Cypher DES 3.5x13; cutting balloon arthrectomy of mid RCA with Cypher DES 3.5x18   CORONARY ANGIOPLASTY WITH STENT PLACEMENT  11/2008   stenting of mid RCA with 4.0x57mm driver, non-DES   ICD IMPLANT N/A 08/20/2020   Procedure: ICD IMPLANT;  Surgeon: Constance Haw, MD;  Location: [place] CV LAB;  Service: Cardiovascular;  Laterality: N/A;   INTRAVASCULAR PRESSURE WIRE/FFR STUDY N/A 03/02/2020   Procedure: INTRAVASCULAR PRESSURE WIRE/FFR STUDY;  Surgeon: Leonie Man, MD;  Location: [place] CV LAB;  Service: Cardiovascular;  Laterality: N/A;   LEFT HEART CATH AND CORONARY ANGIOGRAPHY N/A 03/02/2020   Procedure: LEFT HEART CATH AND CORONARY ANGIOGRAPHY;  Surgeon: Leonie Man, MD;  Location: [place] CV LAB;  Service: Cardiovascular;  Laterality: N/A;   LEFT HEART CATH AND CORONARY ANGIOGRAPHY N/A 08/19/2020   Procedure: LEFT HEART CATH AND CORONARY ANGIOGRAPHY;  Surgeon: Lorretta Harp, MD;  Location: [place] CV LAB;  Service: Cardiovascular;  Laterality: N/A;   LEFT HEART CATHETERIZATION WITH CORONARY ANGIOGRAM N/A 02/27/2012   Procedure: LEFT HEART CATHETERIZATION WITH CORONARY ANGIOGRAM;  Surgeon: Lorretta Harp, MD;  Location: [facility] CATH LAB;  Service: Cardiovascular;  Laterality: N/A;   TRANSTHORACIC ECHOCARDIOGRAM  07/29/2010   EF 50=55%, mod inf wall hypokinesis & mild post wall hypokinesis; LA mild-mod dilated; mild mitral annular calcif & mild MR; mild TR & elevated RV systolic pressure; AV mildly sclerotic; mild aortic root dilatation    V TACH ABLATION N/A 01/20/2021   Procedure: V TACH ABLATION;  Surgeon: Vickie Epley, MD;  Location: [place] CV LAB;  Service: Cardiovascular;   Laterality: N/A;       Family History  Problem Relation Age of Onset   Heart attack Father     Social History   Tobacco Use   Smoking status: Former    Packs/day: 1.00    Years: 50.00    Pack years: 50.00    Types: Cigarettes    Quit date: 07/20/2016    Years since quitting: 4.5   Smokeless tobacco: Never  Vaping Use   Vaping Use: Never used  Substance Use Topics   Alcohol use: Not Currently    Alcohol/week: 0.0 standard drinks   Drug use: No    Home Medications Prior to Admission medications   Medication Sig Start Date End Date Taking? Authorizing Provider  acetaminophen (TYLENOL) 500 MG tablet Take 500 mg by mouth every 6 (six) hours as needed for moderate pain or headache.    [provider]  albuterol (VENTOLIN HFA) 108 (90 Base) MCG/ACT inhaler Inhale 2 puffs into the lungs every 6 (six) hours as needed for wheezing or shortness of breath. 06/14/20   Icard,  Bradley L, DO  doxycycline (VIBRA-TABS) 100 MG tablet Take 1 tablet (100 mg total) by mouth 2 (two) times daily. 02/04/21   Edrick Kins, DPM  ELIQUIS 5 MG TABS tablet TAKE 1 TABLET BY MOUTH TWICE A DAY 10/13/20   Constance Haw, MD  ENTRESTO 24-26 MG TAKE 1 TABLET BY MOUTH TWICE A DAY 10/08/20   Troy Sine, MD  Fluticasone Furoate (ARNUITY ELLIPTA) 200 MCG/ACT AEPB Inhale 1 puff into the lungs daily. 12/29/20   Icard, Leory Plowman L, DO  glucose blood (ONETOUCH ULTRA) test strip USE TO CHECK BLOOD SUGAR 3 TIMES A DAY (E11.9) 12/09/20   [name], MD  insulin aspart (NOVOLOG FLEXPEN) 100 UNIT/ML FlexPen INJECT 5-20 UNITS SUBCUTANEOUSLY WITH LUNCH AND DINNER 06/16/20   [name], MD  Insulin Pen Needle (NOVOFINE) 32G X 6 MM MISC 1 each by Other route daily. 08/19/19   [name], MD  JARDIANCE 25 MG TABS tablet TAKE 1 TABLET BY MOUTH EVERY DAY 11/09/20   [name], MD  LEVEMIR FLEXTOUCH 100 UNIT/ML FlexPen INJECT 46 UNITS INTO THE SKIN DAILY. DX:E11.9 01/11/21   [name],  MD  metoprolol succinate (TOPROL-XL) 100 MG 24 hr tablet Take 1 tablet (100 mg total) by mouth daily. Take with or immediately following a meal. 11/26/20   Camnitz, Ocie Doyne, MD  mexiletine (MEXITIL) 200 MG capsule Take 1 capsule (200 mg total) by mouth every 12 (twelve) hours. 01/28/21   Baldwin Jamaica, PA-C  montelukast (SINGULAIR) 10 MG tablet TAKE 1 TABLET BY MOUTH EVERY DAY 08/19/20   [name], MD  nitroGLYCERIN (NITROSTAT) 0.4 MG SL tablet PLACE 1 TABLET (0.4 MG TOTAL) UNDER THE TONGUE EVERY 5 (FIVE) MINUTES AS NEEDED FOR CHEST PAIN. 07/27/20   Troy Sine, MD  rosuvastatin (CRESTOR) 20 MG tablet TAKE 1 TABLET BY MOUTH EVERY DAY 06/21/20   Troy Sine, MD  sotalol (BETAPACE) 160 MG tablet Take 1 tablet (160 mg total) by mouth 2 (two) times daily. 01/28/21   Baldwin Jamaica, PA-C    Allergies    Omega-3 fatty acids, Benazepril, and Fish allergy  Review of Systems   Review of Systems Ten systems reviewed and are negative for acute change, except as noted in the HPI.   Physical Exam Updated Vital Signs BP (!) 140/96 (BP Location: Left Arm)   Pulse 69   Temp 98.2 F (36.8 C) (Oral)   Resp 18   SpO2 100%   Physical Exam Physical Exam  Nursing note and vitals reviewed. Constitutional: He appears well-developed and well-nourished. No distress.  HENT:  Head: Normocephalic and atraumatic.  Eyes: Conjunctivae normal are normal. No scleral icterus.  Neck: Normal range of motion. Neck supple.  Cardiovascular: Normal rate, regular rhythm and normal heart sounds.  DP and PT pulse 2+ bilaterally Pulmonary/Chest: Effort normal and breath sounds normal. No respiratory distress.  Abdominal: Soft. There is no tenderness.  Musculoskeletal: There is edema and erythema with lymphangitis on the dorsum of the left foot.  There is a small ulcer on the medial aspect of the second toe with active purulent and sanguinous drainage.  The toe is swollen, angry and red. Neurological: He is  alert.  Skin: Skin is warm and dry. He is not diaphoretic.  Psychiatric: His behavior is normal.   ED Results / Procedures / Treatments   Labs (all labs ordered are listed, but only abnormal results are displayed) Labs Reviewed  COMPREHENSIVE METABOLIC PANEL -  Abnormal; Notable for the following components:      Result Value   Glucose, Bld 218 (*)    Total Protein 6.1 (*)    Albumin 3.3 (*)    All other components within normal limits  CBC WITH DIFFERENTIAL/PLATELET - Abnormal; Notable for the following components:   Hemoglobin 17.3 (*)    HCT 53.5 (*)    Neutro Abs 8.4 (*)    All other components within normal limits  C-REACTIVE PROTEIN - Abnormal; Notable for the following components:   CRP 1.9 (*)    All other components within normal limits  LACTIC ACID, PLASMA  LACTIC ACID, PLASMA    EKG None  Radiology DG Foot Complete Left  Result Date: 02/08/2021 CLINICAL DATA:  Pain and edema EXAM: LEFT FOOT - COMPLETE 3+ VIEW COMPARISON:  None. FINDINGS: No acute fracture or dislocation. Changes of the midfoot and IP joints. No destructive osseous changes to suggest osteomyelitis. Soft tissue swelling of the forefoot IMPRESSION: No evidence of osteomyelitis. Electronically Signed   By: Yetta Glassman M.D.   On: 02/08/2021 13:54    Procedures Procedures   Medications Ordered in ED Medications - No data to display  ED Course  I have reviewed the triage vital signs and the nursing notes.  Pertinent labs & imaging results that were available during my care of the patient were reviewed by me and considered in my medical decision making (see chart for details).    MDM Rules/Calculators/A&P                          70 year old male here with complaint of redness of the left foot and toe.  Sent in by podiatry for suspected osteomyelitis/cellulitis of the left foot.  Patient has a history of diabetes.  I ordered and reviewed labs that include CBC without elevated white blood cell  count.  He does have a slightly elevated hemoglobin level.  CMP with elevated blood glucose at 218.  C-reactive protein is only mildly elevated and only mildly elevated sed rate.  Respiratory panel is negative for acute COVID or flu or influenza.  I ordered and reviewed a left foot x-ray which shows no evidence of osteomyelitis.  Patient started on antibiotics and will be admitted to the hospitalist service.   Final Clinical Impression(s) / ED Diagnoses Final diagnoses:  None    Rx / DC Orders ED Discharge Orders     None        Margarita Mail, PA-C 02/09/21 2239    Fredia Sorrow, MD 02/17/21 [phone]

## 2021-02-08 NOTE — ED Triage Notes (Signed)
Pt sent over by his foot doctor for possible admission for and infected left foot , been on antibiotics since Friday and foot is not getting better (red and swollen )

## 2021-02-08 NOTE — Progress Notes (Signed)
Pharmacy Antibiotic Note  John [name] is a 70 y.o. male admitted on 02/08/2021 with  wound infection with cellulitis .  Pharmacy has been consulted for vancomycin dosing.  Patient with history of diabetes, hypertension, hyperlipidemia, status post pacemaker placement. Patient sent here by podiatrist for evaluation of foot ulcer. XR of foot w/o findings of osteomyelitis.  SCr is 1.06 which is near baseline. WBC 10.5; LA 1.2  Plan: Ceftriaxone per MD Vancomycin 2250 mg q24h unless change in renal function Monitor renal function and adjust frequency as indicated F/u cultures, MR foot, CT toe Levels when at steady state De-escalate antibiotics as appropriate    Temp (24hrs), Avg:98.2 F (36.8 C), Min:98.2 F (36.8 C), Max:98.2 F (36.8 C)  Recent Labs  Lab 02/08/21 1303  WBC 10.5  CREATININE 1.06  LATICACIDVEN 1.2    Estimated Creatinine Clearance: 77.2 mL/min (by C-G formula based on SCr of 1.06 mg/dL).    Allergies  Allergen Reactions   Omega-3 Fatty Acids Hives and Itching   Benazepril Other (See Comments)    hyperkalemia   Fish Allergy Itching    Antimicrobials this admission: Ceftriaxone 8/23 >>  Vancomycin 8/23 >>   Microbiology results: Pending  Thank you for allowing pharmacy to be a part of this patient's care.  Lorelei Pont, PharmD, BCPS 02/08/2021 8:11 PM ED Clinical Pharmacist -  [phone]

## 2021-02-08 NOTE — ED Notes (Addendum)
Pt c/o itching and red face and head. MD aware.

## 2021-02-08 NOTE — H&P (Signed)
History and Physical    John [name] WIO:[id number] DOB: 10-16-1950 DOA: 02/08/2021  PCP: [name], MD  Patient coming from: Home  I have personally briefly reviewed patient's old medical records in [place]  Chief Complaint: Foot ulcer  HPI: John [name] is a 70 y.o. male with medical history significant of prior MI / CAD, ICM, HTN, DM2, VT s/p AICD.  Pt with L second toe ulcer that recently became infected.  Saw Dr. Amalia Hailey on the 19th, started on doxycycline.  Despite taking doxycycline the infection has worsened with increased (spreading) erythema, edema of the L second toe and now forefoot.  He returned to podiatrist office today, Dr. Sherryle Lis sent the patient in to the ED for IV ABX and MRI with plans for admission to hospitalist, notified Dr. Amalia Hailey as well according to his note.  Pt with no fevers, chills.   ED Course: WBC 10.5k, Tm 98.2  Plain film neg for osteo.  MRI deferred to daytime as pt has AICD.  Started on rocephin + flagyl.   Review of Systems: As per HPI, otherwise all review of systems negative.  Past Medical History:  Diagnosis Date   Atrial flutter ([place])    s/p cardioversion   Coronary artery disease    Diabetes mellitus    GERD (gastroesophageal reflux disease)    History of nuclear stress test 04/04/2011   lexiscan; mod-large in size fixed inferolateral defect (scar); non-diagnostic for ischemia; low risk scan    Hyperlipidemia    Hypertension    Left foot drop    r/t past disk srugery - uses Kevlar brace   Myocardial infarction ([place])    posterior MI   Shortness of breath    Sleep apnea    on CPAP; 04/28/2007 split-night - AHI during total sleep 44.43/hr and REM 72.56/hr    Past Surgical History:  Procedure Laterality Date   [place]  2010   6 stents total   CARDIAC CATHETERIZATION  01/2000   percutaneous transluminal coronary balloon angioplasty of mid RCA stenotic lesion    CARDIAC CATHETERIZATION  06/2006   no stenting; ischemic cardiomyopathy, EF 40-45%   CARDIOVERSION N/A 07/28/2016   Procedure: CARDIOVERSION;  Surgeon: Troy Sine, MD;  Location: [facility];  Service: Cardiovascular;  Laterality: N/A;   CORONARY ANGIOPLASTY  09/1998   mid-distal RCA balloon dilatation, 4.5 & 5.0 stents    CORONARY ANGIOPLASTY WITH STENT PLACEMENT  03/1994   angioplasty & stenting (non-DES) of circumflex/prox ramus intermedius   CORONARY ANGIOPLASTY WITH STENT PLACEMENT  10/1994   large iliac [id number] stent to RCA   [place]  12/2002   4.83mm stents x2 of RCA   CORONARY ANGIOPLASTY WITH STENT PLACEMENT  01/2005   cutting balloon arthrectomy of distal RCA & Cypher DES 3.5x13; cutting balloon arthrectomy of mid RCA with Cypher DES 3.5x18   CORONARY ANGIOPLASTY WITH STENT PLACEMENT  11/2008   stenting of mid RCA with 4.0x75mm driver, non-DES   ICD IMPLANT N/A 08/20/2020   Procedure: ICD IMPLANT;  Surgeon: Constance Haw, MD;  Location: [facility];  Service: Cardiovascular;  Laterality: N/A;   INTRAVASCULAR PRESSURE WIRE/FFR STUDY N/A 03/02/2020   Procedure: INTRAVASCULAR PRESSURE WIRE/FFR STUDY;  Surgeon: Leonie Man, MD;  Location: [place] [facility];  Service: Cardiovascular;  Laterality: N/A;   LEFT HEART CATH AND CORONARY ANGIOGRAPHY N/A 03/02/2020   Procedure: LEFT HEART CATH AND CORONARY  ANGIOGRAPHY;  Surgeon: Leonie Man, MD;  Location: [place];  Service: Cardiovascular;  Laterality: N/A;   LEFT HEART CATH AND CORONARY ANGIOGRAPHY N/A 08/19/2020   Procedure: LEFT HEART CATH AND CORONARY ANGIOGRAPHY;  Surgeon: Lorretta Harp, MD;  Location: [place] [facility];  Service: Cardiovascular;  Laterality: N/A;   LEFT HEART CATHETERIZATION WITH CORONARY ANGIOGRAM N/A 02/27/2012   Procedure: LEFT HEART CATHETERIZATION WITH CORONARY ANGIOGRAM;  Surgeon: Lorretta Harp, MD;  Location: [facility] CATH [facility];  Service: Cardiovascular;   Laterality: N/A;   TRANSTHORACIC ECHOCARDIOGRAM  07/29/2010   EF 50=55%, mod inf wall hypokinesis & mild post wall hypokinesis; LA mild-mod dilated; mild mitral annular calcif & mild MR; mild TR & elevated RV systolic pressure; AV mildly sclerotic; mild aortic root dilatation    V TACH ABLATION N/A 01/20/2021   Procedure: V TACH ABLATION;  Surgeon: Vickie Epley, MD;  Location: [place] [facility];  Service: Cardiovascular;  Laterality: N/A;     reports that he quit smoking about 4 years ago. His smoking use included cigarettes. He has a 50.00 pack-year smoking history. He has never used smokeless tobacco. He reports that he does not currently use alcohol. He reports that he does not use drugs.  Allergies  Allergen Reactions   Omega-3 Fatty Acids Hives and Itching   Benazepril Other (See Comments)    hyperkalemia   Fish Allergy Itching    Family History  Problem Relation Age of Onset   Heart attack Father      Prior to Admission medications   Medication Sig Start Date End Date Taking? Authorizing Provider  acetaminophen (TYLENOL) 500 MG tablet Take 500 mg by mouth every 6 (six) hours as needed for moderate pain or headache.    [provider]  albuterol (VENTOLIN HFA) 108 (90 Base) MCG/ACT inhaler Inhale 2 puffs into the lungs every 6 (six) hours as needed for wheezing or shortness of breath. 06/14/20   Icard, Octavio Graves, DO  ELIQUIS 5 MG TABS tablet TAKE 1 TABLET BY MOUTH TWICE A DAY 10/13/20   Constance Haw, MD  ENTRESTO 24-26 MG TAKE 1 TABLET BY MOUTH TWICE A DAY 10/08/20   Troy Sine, MD  Fluticasone Furoate (ARNUITY ELLIPTA) 200 MCG/ACT AEPB Inhale 1 puff into the lungs daily. 12/29/20   Icard, Leory Plowman L, DO  glucose blood (ONETOUCH ULTRA) test strip USE TO CHECK BLOOD SUGAR 3 TIMES A DAY (E11.9) 12/09/20   [name], MD  insulin aspart (NOVOLOG FLEXPEN) 100 UNIT/ML FlexPen INJECT 5-20 UNITS SUBCUTANEOUSLY WITH LUNCH AND DINNER 06/16/20   [name], MD  Insulin Pen Needle (NOVOFINE) 32G X 6 MM MISC 1 each by Other route daily. 08/19/19   [name], MD  JARDIANCE 25 MG TABS tablet TAKE 1 TABLET BY MOUTH EVERY DAY 11/09/20   [name], MD  LEVEMIR FLEXTOUCH 100 UNIT/ML FlexPen INJECT 46 UNITS INTO THE SKIN DAILY. DX:E11.9 01/11/21   [name], MD  metoprolol succinate (TOPROL-XL) 100 MG 24 hr tablet Take 1 tablet (100 mg total) by mouth daily. Take with or immediately following a meal. 11/26/20   Camnitz, Ocie Doyne, MD  mexiletine (MEXITIL) 200 MG capsule Take 1 capsule (200 mg total) by mouth every 12 (twelve) hours. 01/28/21   Baldwin Jamaica, PA-C  montelukast (SINGULAIR) 10 MG tablet TAKE 1 TABLET BY MOUTH EVERY DAY 08/19/20   [name], MD  nitroGLYCERIN (NITROSTAT) 0.4 MG SL tablet  PLACE 1 TABLET (0.4 MG TOTAL) UNDER THE TONGUE EVERY 5 (FIVE) MINUTES AS NEEDED FOR CHEST PAIN. 07/27/20   Troy Sine, MD  rosuvastatin (CRESTOR) 20 MG tablet TAKE 1 TABLET BY MOUTH EVERY DAY 06/21/20   Troy Sine, MD  sotalol (BETAPACE) 160 MG tablet Take 1 tablet (160 mg total) by mouth 2 (two) times daily. 01/28/21   Baldwin Jamaica, PA-C    Physical Exam: Vitals:   02/08/21 1219 02/08/21 1534 02/08/21 1623 02/08/21 2008  BP: 125/85 (!) 140/96 (!) 140/96 (!) 143/89  Pulse: 75 70 69 69  Resp: 16 18 18 17   Temp: 98.2 F (36.8 C)     TempSrc: Oral     SpO2: 97% 100% 100% 97%    Constitutional: NAD, calm, comfortable Eyes: PERRL, lids and conjunctivae normal ENMT: Mucous membranes are moist. Posterior pharynx clear of any exudate or lesions.Normal dentition.  Neck: normal, supple, no masses, no thyromegaly Respiratory: clear to auscultation bilaterally, no wheezing, no crackles. Normal respiratory effort. No accessory muscle use.  Cardiovascular: Regular rate and rhythm, no murmurs / rubs / gallops. No extremity edema. 2+ pedal pulses. No carotid bruits.  Abdomen: no tenderness, no masses palpated. No  hepatosplenomegaly. Bowel sounds positive.  Musculoskeletal: no clubbing / cyanosis. No joint deformity upper and lower extremities. Good ROM, no contractures. Normal muscle tone.  Skin: Ulceration to medial L second toe, erythema and edema of L second toe.  Erythema extends up forefoot. Neurologic: CN 2-12 grossly intact. Sensation intact, DTR normal. Strength 5/5 in all 4.  Psychiatric: Normal judgment and insight. Alert and oriented x 3. Normal mood.    Labs on Admission: I have personally reviewed following labs and imaging studies  CBC: Recent Labs  Lab 02/08/21 1303  WBC 10.5  NEUTROABS 8.4*  HGB 17.3*  HCT 53.5*  MCV 94.7  PLT 476   Basic Metabolic Panel: Recent Labs  Lab 02/08/21 1303  NA 141  K 4.0  CL 108  CO2 26  GLUCOSE 218*  BUN 13  CREATININE 1.06  CALCIUM 9.2   GFR: Estimated Creatinine Clearance: 77.2 mL/min (by C-G formula based on SCr of 1.06 mg/dL). Liver Function Tests: Recent Labs  Lab 02/08/21 1303  AST 20  ALT 20  ALKPHOS 87  BILITOT 1.0  PROT 6.1*  ALBUMIN 3.3*   No results for input(s): LIPASE, AMYLASE in the last 168 hours. No results for input(s): AMMONIA in the last 168 hours. Coagulation Profile: No results for input(s): INR, PROTIME in the last 168 hours. Cardiac Enzymes: No results for input(s): CKTOTAL, CKMB, CKMBINDEX, TROPONINI in the last 168 hours. BNP (last 3 results) No results for input(s): PROBNP in the last 8760 hours. HbA1C: Recent Labs    02/08/21 1939  HGBA1C 8.0*   CBG: No results for input(s): GLUCAP in the last 168 hours. Lipid Profile: No results for input(s): CHOL, HDL, LDLCALC, TRIG, CHOLHDL, LDLDIRECT in the last 72 hours. Thyroid Function Tests: No results for input(s): TSH, T4TOTAL, FREET4, T3FREE, THYROIDAB in the last 72 hours. Anemia Panel: No results for input(s): VITAMINB12, FOLATE, FERRITIN, TIBC, IRON, RETICCTPCT in the last 72 hours. Urine analysis:    Component Value Date/Time    COLORURINE YELLOW 01/18/2021 0950   APPEARANCEUR CLEAR 01/18/2021 0950   LABSPEC 1.024 01/18/2021 0950   PHURINE 5.0 01/18/2021 0950   GLUCOSEU >=500 (A) 01/18/2021 0950   HGBUR NEGATIVE 01/18/2021 0950   BILIRUBINUR NEGATIVE 01/18/2021 0950   KETONESUR 5 (A) 01/18/2021 0950  PROTEINUR NEGATIVE 01/18/2021 0950   UROBILINOGEN 1.0 02/27/2012 0001   NITRITE NEGATIVE 01/18/2021 0950   LEUKOCYTESUR NEGATIVE 01/18/2021 0950    Radiological Exams on Admission: DG Foot Complete Left  Result Date: 02/08/2021 CLINICAL DATA:  Pain and edema EXAM: LEFT FOOT - COMPLETE 3+ VIEW COMPARISON:  None. FINDINGS: No acute fracture or dislocation. Changes of the midfoot and IP joints. No destructive osseous changes to suggest osteomyelitis. Soft tissue swelling of the forefoot IMPRESSION: No evidence of osteomyelitis. Electronically Signed   By: Yetta Glassman M.D.   On: 02/08/2021 13:54    EKG: Independently reviewed.  Assessment/Plan Principal Problem:   Diabetic infection of left foot (HCC) Active Problems:   CAD, multiple prior RCA PCI's. Last cath 2010, Myoview low risk Oct 2012   DM type 2, goal HbA1c < 7% (HCC)   HTN (hypertension)   Chronic systolic CHF (congestive heart failure) (HCC)   History of ventricular tachycardia    Diabetic infection of L foot - LE wound pathway Empiric rocephin, flagyl, vanc MRSA risk factor = recent hospitalizations in past 90 days. Check MRSA PCR nares BCx MRI foot Will need to be done during day hours as pt has AICD though his AICD is MRI compatible thankfully. Call podiatry in AM (though sounds like Dr. Sherryle Lis already contacted Dr. Amalia Hailey before sending him to ED today) Repeat CBC/BMP in AM Dm2 - Cont Jardiance Cont Levemir 46 QHS Novolog 4u mealtimes Mod scale SSI AC H/o VT - Cont Sotalol Cont Mexitil Will leave pt on tele monitor while hes here given recent admits for VT just earlier this month. HTN - Cont Entresto Cont metoprolol -  confirmed with patient that he takes BOTH sotalol AND metoprolol currently H/o A.Flutter - Cont eliquis Cont metoprolol  DVT prophylaxis: Eliquis Code Status: Full Family Communication: No family in room Disposition Plan: Home after improvement in cellulitis, cleared by podiatry Consults called: None, Call podiatry (TFA) in AM Admission status: Admit to inpatient  Severity of Illness: The appropriate patient status for this patient is INPATIENT. Inpatient status is judged to be reasonable and necessary in order to provide the required intensity of service to ensure the patient's safety. The patient's presenting symptoms, physical exam findings, and initial radiographic and laboratory data in the context of their chronic comorbidities is felt to place them at high risk for further clinical deterioration. Furthermore, it is not anticipated that the patient will be medically stable for discharge from the hospital within 2 midnights of admission. The following factors support the patient status of inpatient.   IP status for limb threatening infection of L Foot.  Patient has FAILED outpatient antibiotic treatment with doxycycline.   * I certify that at the point of admission it is my clinical judgment that the patient will require inpatient hospital care spanning beyond 2 midnights from the point of admission due to high intensity of service, high risk for further deterioration and high frequency of surveillance required.*   Pihu Basil M. DO [facility]  How to contact the [facility] Attending or Consulting provider [place] or covering provider during after hours [place], for this patient?  Check the care team in [facility] and look for a) attending/consulting [facility] provider listed and b) the [facility] team listed Log into [email or web address]  Amion Physician Scheduling and messaging for groups and whole hospitals  On call and physician scheduling software for group practices, residents, hospitalists and other  medical providers for call, clinic, rotation and shift schedules. OnCall  [employer] is a hospital-wide system for scheduling doctors and paging doctors on call. EasyPlot is for scientific plotting and data analysis.  [email or web address]  and use [place]'s universal password to access. If you do not have the password, please contact the hospital operator.  Locate the [facility] provider you are looking for under [facility] and page to a number that you can be directly reached. If you still have difficulty reaching the provider, please page the [facility] (Director on Call) for the Hospitalists listed on amion for assistance.  02/08/2021, 8:33 PM

## 2021-02-09 ENCOUNTER — Encounter (HOSPITAL_COMMUNITY): Payer: Self-pay | Admitting: Internal Medicine

## 2021-02-09 ENCOUNTER — Other Ambulatory Visit: Payer: Self-pay | Admitting: Physician Assistant

## 2021-02-09 ENCOUNTER — Inpatient Hospital Stay (HOSPITAL_COMMUNITY): Payer: Medicare Other

## 2021-02-09 DIAGNOSIS — M86672 Other chronic osteomyelitis, left ankle and foot: Secondary | ICD-10-CM

## 2021-02-09 DIAGNOSIS — L0390 Cellulitis, unspecified: Secondary | ICD-10-CM

## 2021-02-09 DIAGNOSIS — I25118 Atherosclerotic heart disease of native coronary artery with other forms of angina pectoris: Secondary | ICD-10-CM

## 2021-02-09 LAB — GLUCOSE, CAPILLARY
Glucose-Capillary: 120 mg/dL — ABNORMAL HIGH (ref 70–99)
Glucose-Capillary: 169 mg/dL — ABNORMAL HIGH (ref 70–99)
Glucose-Capillary: 139 mg/dL — ABNORMAL HIGH (ref 70–99)
Glucose-Capillary: 167 mg/dL — ABNORMAL HIGH (ref 70–99)

## 2021-02-09 LAB — CBC
HCT: 45.5 % (ref 39.0–52.0)
Hemoglobin: 14.5 g/dL (ref 13.0–17.0)
MCH: 30 pg (ref 26.0–34.0)
MCHC: 31.9 g/dL (ref 30.0–36.0)
MCV: 94 fL (ref 80.0–100.0)
Platelets: 159 10*3/uL (ref 150–400)
RBC: 4.84 MIL/uL (ref 4.22–5.81)
RDW: 13.8 % (ref 11.5–15.5)
WBC: 6.8 10*3/uL (ref 4.0–10.5)
nRBC: 0 % (ref 0.0–0.2)

## 2021-02-09 LAB — BASIC METABOLIC PANEL
Anion gap: 6 (ref 5–15)
BUN: 11 mg/dL (ref 8–23)
CO2: 27 mmol/L (ref 22–32)
Calcium: 8.6 mg/dL — ABNORMAL LOW (ref 8.9–10.3)
Chloride: 109 mmol/L (ref 98–111)
Creatinine, Ser: 1.03 mg/dL (ref 0.61–1.24)
GFR, Estimated: >60 mL/min (ref >60)
Glucose, Bld: 139 mg/dL — ABNORMAL HIGH (ref 70–99)
Potassium: 3.5 mmol/L (ref 3.5–5.1)
Sodium: 142 mmol/L (ref 135–145)

## 2021-02-09 MED ORDER — INSULIN GLARGINE-YFGN 100 UNIT/ML ~~LOC~~ SOLN
40.0000 [IU] | Freq: Every day | SUBCUTANEOUS | Status: DC
  Filled 2021-02-09: qty 0.4

## 2021-02-09 MED ORDER — ALBUTEROL SULFATE (2.5 MG/3ML) 0.083% IN NEBU: 2.5000 mg | INHALATION_SOLUTION | Freq: Four times a day (QID) | RESPIRATORY_TRACT | Status: DC | PRN

## 2021-02-09 MED ORDER — MEXILETINE HCL 150 MG PO CAPS
300.0000 mg | ORAL_CAPSULE | Freq: Two times a day (BID) | ORAL | Status: DC
  Administered 2021-02-09 – 2021-02-11 (×4): 300 mg via ORAL
  Filled 2021-02-09 (×5): qty 2

## 2021-02-09 MED ORDER — ADULT MULTIVITAMIN W/MINERALS CH
1.0000 | ORAL_TABLET | Freq: Every day | ORAL | Status: DC
  Administered 2021-02-09 – 2021-02-11 (×3): 1 via ORAL
  Filled 2021-02-09 (×3): qty 1

## 2021-02-09 MED ORDER — POTASSIUM CHLORIDE CRYS ER 20 MEQ PO TBCR
40.0000 meq | EXTENDED_RELEASE_TABLET | Freq: Once | ORAL | Status: AC
  Administered 2021-02-09: 40 meq via ORAL
  Filled 2021-02-09: qty 2

## 2021-02-09 MED ORDER — POTASSIUM CHLORIDE CRYS ER 20 MEQ PO TBCR
20.0000 meq | EXTENDED_RELEASE_TABLET | Freq: Every evening | ORAL | Status: AC
  Administered 2021-02-09: 20 meq via ORAL
  Filled 2021-02-09: qty 1

## 2021-02-09 MED ORDER — BUDESONIDE 0.5 MG/2ML IN SUSP
1.0000 mg | Freq: Two times a day (BID) | RESPIRATORY_TRACT | Status: DC
  Administered 2021-02-09 – 2021-02-11 (×5): 1 mg via RESPIRATORY_TRACT
  Filled 2021-02-09 (×4): qty 4

## 2021-02-09 MED ORDER — INSULIN GLARGINE-YFGN 100 UNIT/ML ~~LOC~~ SOLN
20.0000 [IU] | Freq: Every day | SUBCUTANEOUS | Status: DC
  Administered 2021-02-09 – 2021-02-10 (×2): 20 [IU] via SUBCUTANEOUS
  Filled 2021-02-09 (×3): qty 0.2

## 2021-02-09 MED ORDER — VANCOMYCIN HCL 1750 MG/350ML IV SOLN
1750.0000 mg | Freq: Every day | INTRAVENOUS | Status: DC
  Administered 2021-02-09 – 2021-02-10 (×2): 1750 mg via INTRAVENOUS
  Filled 2021-02-09 (×2): qty 350

## 2021-02-09 MED ORDER — BUDESONIDE 0.5 MG/2ML IN SUSP: 1.0000 mg | Freq: Two times a day (BID) | RESPIRATORY_TRACT | Status: DC

## 2021-02-09 MED ORDER — MEXILETINE HCL 150 MG PO CAPS: 300.0000 mg | ORAL_CAPSULE | Freq: Two times a day (BID) | ORAL | 5 refills | Status: DC

## 2021-02-09 NOTE — Progress Notes (Addendum)
Pt is hospitalized for Toe infection EP service reviewed the patient's ICD interrogation as part of his pre MRI evaluation. Note that he has had some VT (pre-hospitalization) Discussed with Dr. Curt Bears.   Will increase his mexiletine to 300mg  BID and arrange EP follow up out patient. I have updated his med rec for discharge and [facility]  I have made the patient aware and he was given a new Good Rx coupon.   Tommye Standard, PA-C

## 2021-02-09 NOTE — Consult Note (Signed)
PODIATRY CONSULTATION  NAME John [name] MRN [id number] DOB 09-11-1950 DOA 02/08/2021   Reason for consult: No chief complaint on file.   Consulting physician: Riccardo Dubin Arrien  History of present illness: 70 y.o. male presenting for worsening infection left second toe.  Last seen in the podiatry office 02/07/2021 for worsening infection of the left second toe.  Failed outpatient oral doxycycline.  Patient admitted overnight and MRI completed today.  Past Medical History:  Diagnosis Date   Atrial flutter ([place])    s/p cardioversion   Coronary artery disease    Diabetes mellitus    GERD (gastroesophageal reflux disease)    History of nuclear stress test 04/04/2011   lexiscan; mod-large in size fixed inferolateral defect (scar); non-diagnostic for ischemia; low risk scan    Hyperlipidemia    Hypertension    Left foot drop    r/t past disk srugery - uses Kevlar brace   Myocardial infarction [facility])    posterior MI   Shortness of breath    Sleep apnea    on CPAP; 04/28/2007 split-night - AHI during total sleep 44.43/hr and REM 72.56/hr    CBC Latest Ref Rng & Units 02/09/2021 02/08/2021 02/01/2021  WBC 4.0 - 10.5 K/uL 6.8 10.5 8.1  Hemoglobin 13.0 - 17.0 g/dL 14.5 17.3(H) 16.2  Hematocrit 39.0 - 52.0 % 45.5 53.5(H) 49.2  Platelets 150 - 400 K/uL 159 194 161    BMP Latest Ref Rng & Units 02/09/2021 02/08/2021 02/01/2021  Glucose 70 - 99 mg/dL 139(H) 218(H) 226(H)  BUN 8 - 23 mg/dL 11 13 12   Creatinine 0.61 - 1.24 mg/dL 1.03 1.06 0.83  BUN/Creat Ratio 10 - 24 - - 14  Sodium 135 - 145 mmol/L 142 141 142  Potassium 3.5 - 5.1 mmol/L 3.5 4.0 4.2  Chloride 98 - 111 mmol/L 109 108 106  CO2 22 - 32 mmol/L 27 26 24   Calcium 8.9 - 10.3 mg/dL 8.6(L) 9.2 8.7      Physical Exam: General: The patient is alert and oriented x3 in no acute distress.   Dermatology: Wound noted to medial aspect left second toe extending to bone.  No malodor.  Vascular: Palpable pedal pulses bilaterally.   Capillary refill immediate.  Edema and erythema localized to the left second toe extending proximal to the level of the MTPJ  Neurological: Epicritic and protective threshold grossly intact bilaterally.   Musculoskeletal Exam: No structural deformity noted.  MRI LT foot WO contrast 02/09/2021: IMPRESSION: Osteomyelitis of the second toe distal and middle phalanx with adjacent soft tissue ulcer. No evidence of soft tissue abscess.   ASSESSMENT/PLAN OF CARE 1.  Osteomyelitis left second toe with ulcer and worsening cellulitis -Recommend left second toe amputation.  Discussed this with the patient.  We were already planning for this outpatient so the patient is on board and aware.  -Continue IV antibiotics as per admitting physician -Preoperative orders placed.  Surgery will consist of left second toe amputation. -Surgery tentatively scheduled for tomorrow, 02/10/2021, PM.  N.p.o. after midnight.     Thank you for the consult.  Please contact me directly with any questions or concerns.  Cell [phone]   Edrick Kins, DPM [facility]  Dr. Edrick Kins, DPM    [address]  [place], [place] [address]                Office [phone]  Fax [phone]

## 2021-02-09 NOTE — Progress Notes (Signed)
Pt states he has not worn cpap in over a year and has declined using one at this time.  Pt aware he can request device at anytime if he changes his mind.  Rt will continue to monitor.

## 2021-02-09 NOTE — Progress Notes (Signed)
RT NOTE:  Pt refusing CPAP. Pt has not worn in over a year.

## 2021-02-09 NOTE — Progress Notes (Signed)
Patient to MRI from [place] for MRI left foot wo contrast. Patient has medtronic device and followed by [facility]. Carelink express sent to [facility] and Renee-cardiology PA. Orders received for DOO 90, tachy therapies off. Will re-program once scan is completed.

## 2021-02-09 NOTE — Telephone Encounter (Signed)
Upon reviewing chart I saw that the patient was able to pick up previous prescribed dose using a good rx card earlier this month. Since this is a new order with a different dose [facility]. I was informed that they were able to run the rx using a discount card and that they are aware of what the issue is. She then proceeded to disconnect our call.

## 2021-02-09 NOTE — H&P (View-Only) (Signed)
PODIATRY CONSULTATION  John [name] MRN [id number] DOB 1951-05-10 DOA 02/08/2021   Reason for consult: No chief complaint on file.   Consulting physician: Riccardo Dubin Arrien  History of present illness: 70 y.o. male presenting for worsening infection left second toe.  Last seen in the podiatry office 02/07/2021 for worsening infection of the left second toe.  Failed outpatient oral doxycycline.  Patient admitted overnight and MRI completed today.  Past Medical History:  Diagnosis Date   Atrial flutter ([place])    s/p cardioversion   Coronary artery disease    Diabetes mellitus    GERD (gastroesophageal reflux disease)    History of nuclear stress test 04/04/2011   lexiscan; mod-large in size fixed inferolateral defect (scar); non-diagnostic for ischemia; low risk scan    Hyperlipidemia    Hypertension    Left foot drop    r/t past disk srugery - uses Kevlar brace   Myocardial infarction [facility])    posterior MI   Shortness of breath    Sleep apnea    on CPAP; 04/28/2007 split-night - AHI during total sleep 44.43/hr and REM 72.56/hr    CBC Latest Ref Rng & Units 02/09/2021 02/08/2021 02/01/2021  WBC 4.0 - 10.5 K/uL 6.8 10.5 8.1  Hemoglobin 13.0 - 17.0 g/dL 14.5 17.3(H) 16.2  Hematocrit 39.0 - 52.0 % 45.5 53.5(H) 49.2  Platelets 150 - 400 K/uL 159 194 161    BMP Latest Ref Rng & Units 02/09/2021 02/08/2021 02/01/2021  Glucose 70 - 99 mg/dL 139(H) 218(H) 226(H)  BUN 8 - 23 mg/dL 11 13 12   Creatinine 0.61 - 1.24 mg/dL 1.03 1.06 0.83  BUN/Creat Ratio 10 - 24 - - 14  Sodium 135 - 145 mmol/L 142 141 142  Potassium 3.5 - 5.1 mmol/L 3.5 4.0 4.2  Chloride 98 - 111 mmol/L 109 108 106  CO2 22 - 32 mmol/L 27 26 24   Calcium 8.9 - 10.3 mg/dL 8.6(L) 9.2 8.7      Physical Exam: General: The patient is alert and oriented x3 in no acute distress.   Dermatology: Wound noted to medial aspect left second toe extending to bone.  No malodor.  Vascular: Palpable pedal pulses bilaterally.   Capillary refill immediate.  Edema and erythema localized to the left second toe extending proximal to the level of the MTPJ  Neurological: Epicritic and protective threshold grossly intact bilaterally.   Musculoskeletal Exam: No structural deformity noted.  MRI LT foot WO contrast 02/09/2021: IMPRESSION: Osteomyelitis of the second toe distal and middle phalanx with adjacent soft tissue ulcer. No evidence of soft tissue abscess.   ASSESSMENT/PLAN OF CARE 1.  Osteomyelitis left second toe with ulcer and worsening cellulitis -Recommend left second toe amputation.  Discussed this with the patient.  We were already planning for this outpatient so the patient is on board and aware.  -Continue IV antibiotics as per admitting physician -Preoperative orders placed.  Surgery will consist of left second toe amputation. -Surgery tentatively scheduled for tomorrow, 02/10/2021, PM.  N.p.o. after midnight.     Thank you for the consult.  Please contact me directly with any questions or concerns.  Cell [phone]   Edrick Kins, DPM [facility]  Dr. Edrick Kins, DPM    [address]  [place], [place] [address]                Office [phone]  Fax [phone]

## 2021-02-09 NOTE — Progress Notes (Signed)
ABI w/ TBI study completed.   Please see CV Proc for preliminary results.   Bernis Schreur, RDMS, RVT  

## 2021-02-09 NOTE — Progress Notes (Signed)
Initial Nutrition Assessment  DOCUMENTATION CODES:   Not applicable  INTERVENTION:   -MVI with minerals daily -Double protein portions with meals  NUTRITION DIAGNOSIS:   Increased nutrient needs related to wound healing as evidenced by estimated needs.  GOAL:   Patient will meet greater than or equal to 90% of their needs  MONITOR:   PO intake, Supplement acceptance, Labs, Weight trends, Skin, I & O's  REASON FOR ASSESSMENT:   Consult Wound healing  ASSESSMENT:   70 y.o. male presents with the above complaint. History confirmed with patient.  He is diabetic and has been working on a chronic left second toe ulcer for some time with Dr. Amalia Hailey.  He saw him last week wound had worsened some and was red and draining a wound culture was taken and he was placed on doxycycline.  He has been taking it since Friday.  He and his wife state since the last day or 2 its continued to get worse and the redness has spread.  Pt admitted with chronic lt second toe ulcer.   Spoke with pt, who was sitting in recliner chair at time of visit. He reports good appetite, and consumed all of his breakfast. Pt typically consumes 2 meals per day (Breakfast: cereal OR eggs and toast; Dinner: hot dog and tater tots OR vegetable plate; Snacks: peanut butter crackers and fritos). Pt drinks mostly water and occasionally sweet tea.   Pt denies any weight loss. He reports UBW is around 220#. Reviewed wt hx; wt has been stable over the past 4 months.   Pt shares that lt toe wound has been chronic and is followed routinely by podiatry. He became concerned with the redness on Friday, for which he was prescribed antibiotics, but it did not improve over the weekend, which promoted pt to come to the hospital. MRI is pending.   Discussed with pt importance of good meal intake and glycemic control to promote wound healing. Pt is accepting that he may need an amputation; RD discussed importance of good glycemic control  for wound healing, whether he undergoes amputation or not. Pt very appreciative to visit and denies additional questions at this time.   Lab Results  Component Value Date   HGBA1C 8.0 (H) 02/08/2021  PTA DM medications are 5-20 units insulin aspart BID, 25 mg jardiance daily, 46 units insulin detemir daily. Pt reports good glycemic control PTA, however, occasionally runs high, "when I eat something I'm not supposed to".   Labs reviewed: CBGS: 120-237 (inpatient orders for glycemic control are 0-15 units insulin aspart TID with meals, 4 units insulin aspart TID with meals, and 40 units insulin glargine-yfgn daily).    NUTRITION - FOCUSED PHYSICAL EXAM:  Flowsheet Row Most Recent Value  Orbital Region No depletion  Upper Arm Region No depletion  Thoracic and Lumbar Region No depletion  Buccal Region No depletion  Temple Region No depletion  Clavicle Bone Region No depletion  Clavicle and Acromion Bone Region No depletion  Scapular Bone Region No depletion  Dorsal Hand No depletion  Patellar Region No depletion  Anterior Thigh Region No depletion  Posterior Calf Region No depletion  Edema (RD Assessment) None  Hair Reviewed  Eyes Reviewed  Mouth Reviewed  Skin Reviewed  Nails Reviewed       Diet Order:   Diet Order             Diet heart healthy/carb modified Room service appropriate? Yes; Fluid consistency: Thin  Diet effective now  EDUCATION NEEDS:   Education needs have been addressed  Skin:  Skin Assessment: Skin Integrity Issues: Skin Integrity Issues:: Diabetic Ulcer Diabetic Ulcer: lt second toe  Last BM:  Unknown  Height:   Ht Readings from Last 1 Encounters:  02/01/21 6' (1.829 m)    Weight:   Wt Readings from Last 1 Encounters:  02/01/21 100.2 kg    Ideal Body Weight:  80.9 kg  BMI:  There is no height or weight on file to calculate BMI.  Estimated Nutritional Needs:   Kcal:  2200-2400  Protein:  105-120  grams  Fluid:  > 2 L    Loistine Chance, RD, LDN, [place] Registered Dietitian II Certified Diabetes Care and Education Specialist Please refer to [facility] for RD and/or RD on-call/weekend/after hours pager

## 2021-02-09 NOTE — Progress Notes (Addendum)
PROGRESS NOTE    John [name]  VEL:[id number] DOB: 1950/09/07 DOA: 02/08/2021 PCP: [name], MD    Brief Narrative:  [name] was admitted to the hospital with the working diagnosis of left diabetic foot infection.   70 yo male with the past medical history of CAD, HTN, T2DM who presented with left foot infection. Positive erythema and edema, predominantly forefoot and second toe, did not respond to outpatient treatment with oral doxycycline. He was referred to the hospital by his podiatry for IV antibiotic therapy and foot MRI.  On his initial physical examination blood pressure 125/85, heart rate 70, respiratory rate 18, temperature 98.2, oxygen saturation 97%, his lungs are clear to auscultation bilaterally, heart S1-S2, present, rhythmic, soft abdomen, left foot with positive edema and erythema, second toe with ulcerated lesion in the medial aspect.  Sodium 141, potassium 4.0, chloride 108, bicarb 26, glucose 218, BUN 13, creatinine 1.0, white count 10.5, hemoglobin 17.3, hematocrit 53.5, platelets 194. SARS COVID-19 negative.  Assessment & Plan:   Principal Problem:   Diabetic infection of left foot ([place]) Active Problems:   CAD, multiple prior RCA PCI's. Last cath 2010, Myoview low risk Oct 2012   DM type 2, goal HbA1c < 7% (HCC)   HTN (hypertension)   Chronic systolic CHF (congestive heart failure) (HCC)   History of ventricular tachycardia   Osteomyelitis of the second toe distal and middle phalanx with adjacent soft tissue ulcer.  Continue medical therapy with ceftriaxone and vancomycin Because infected bone, will need surgical intervention, consult podiatry for further recommendations. No significant pain due to neuropathy.   2. T2DM. Continue glucose cover with insulin therapy, sliding scale and pre-meal. Decrease basal insulin to 20 units to prevent hypoglycemia Fasting glucose 139 and capillary 120, 169 and 139. Continue with empagliflozin   3.  Chronic systolic heart failure. No clinical signs of decompensation, cotnuneu with entresto and metoprolol. Device interrogation per EP, positive VT, continue mexilitine with adjustment in the dose.  Continue empagliflozin   4. Paroxysmal Atrial flutter. Continue with apixaban and sotalol.   5. Hypokalemia. Renal function with serum cr at 1,0 with K at 3,5 and serum bicarbonate at 27. Continue close follow up on electrolytes.   Patient continue to be at high risk for worsening osteomyelitis.   Status is: Inpatient  Remains inpatient appropriate because:Inpatient level of care appropriate due to severity of illness  Dispo: The patient is from: Home              Anticipated d/c is to: Home              Patient currently is not medically stable to d/c.   Difficult to place patient No   DVT prophylaxis:  Apixaban   Code Status:    full  Family Communication:   No family at the bedside      Nutrition Status: Nutrition Problem: Increased nutrient needs Etiology: wound healing Signs/Symptoms: estimated needs Interventions: Refer to RD note for recommendations, MVI     Antimicrobials:  Ceftriaxone and vancomycin     Subjective: Patient with no pain but positive edema on his left foot, no nausea or vomiting, no dyspnea or chest pain.   Objective: Vitals:   02/09/21 0000 02/09/21 0030 02/09/21 0732 02/09/21 1447  BP: 123/87 122/88 100/65 (!) 131/95  Pulse: 70 72 70 73  Resp: 15 16 14 16   Temp:  98.4 F (36.9 C) 97.9 F (36.6 C) 97.7 F (36.5 C)  TempSrc:  Oral Oral Oral  SpO2: 97% 99% 97% 96%    Intake/Output Summary (Last 24 hours) at 02/09/2021 1658 Last data filed at 02/09/2021 1548 Gross per 24 hour  Intake 336.71 ml  Output --  Net 336.71 ml   There were no vitals filed for this visit.  Examination:   General: Not in pain or dyspnea, deconditioned  Neurology: Awake and alert, non focal  E ENT: no pallor, no icterus, oral mucosa moist Cardiovascular: No  JVD. S1-S2 present, rhythmic, no gallops, rubs, or murmurs. No lower extremity edema. Pulmonary: positive breath sounds bilaterally, adequate air movement, no wheezing, rhonchi or rales. Gastrointestinal. Abdomen soft and non tender Skin. Left foot edema with erythema at the second toe, ulcerated lesion lateral aspect.  Musculoskeletal: no joint deformities     Data Reviewed: I have personally reviewed following labs and imaging studies  CBC: Recent Labs  Lab 02/08/21 1303 02/09/21 0350  WBC 10.5 6.8  NEUTROABS 8.4*  --   HGB 17.3* 14.5  HCT 53.5* 45.5  MCV 94.7 94.0  PLT 194 130   Basic Metabolic Panel: Recent Labs  Lab 02/08/21 1303 02/09/21 0350  NA 141 142  K 4.0 3.5  CL 108 109  CO2 26 27  GLUCOSE 218* 139*  BUN 13 11  CREATININE 1.06 1.03  CALCIUM 9.2 8.6*   GFR: Estimated Creatinine Clearance: 79.4 mL/min (by C-G formula based on SCr of 1.03 mg/dL). Liver Function Tests: Recent Labs  Lab 02/08/21 1303  AST 20  ALT 20  ALKPHOS 87  BILITOT 1.0  PROT 6.1*  ALBUMIN 3.3*   No results for input(s): LIPASE, AMYLASE in the last 168 hours. No results for input(s): AMMONIA in the last 168 hours. Coagulation Profile: No results for input(s): INR, PROTIME in the last 168 hours. Cardiac Enzymes: No results for input(s): CKTOTAL, CKMB, CKMBINDEX, TROPONINI in the last 168 hours. BNP (last 3 results) No results for input(s): PROBNP in the last 8760 hours. HbA1C: Recent Labs    02/08/21 1939  HGBA1C 8.0*   CBG: Recent Labs  Lab 02/08/21 2156 02/09/21 0645 02/09/21 1112 02/09/21 1604  GLUCAP 237* 120* 169* 139*   Lipid Profile: No results for input(s): CHOL, HDL, LDLCALC, TRIG, CHOLHDL, LDLDIRECT in the last 72 hours. Thyroid Function Tests: No results for input(s): TSH, T4TOTAL, FREET4, T3FREE, THYROIDAB in the last 72 hours. Anemia Panel: No results for input(s): VITAMINB12, FOLATE, FERRITIN, TIBC, IRON, RETICCTPCT in the last 72  hours.    Radiology Studies: I have reviewed all of the imaging during this hospital visit personally     Scheduled Meds:  apixaban  5 mg Oral BID   budesonide (PULMICORT) nebulizer solution  1 mg Nebulization BID   empagliflozin  25 mg Oral QHS   insulin aspart  0-15 Units Subcutaneous TID WC   insulin aspart  4 Units Subcutaneous TID WC   insulin glargine-yfgn  40 Units Subcutaneous QHS   metoprolol succinate  100 mg Oral QHS   mexiletine  300 mg Oral Q12H   montelukast  10 mg Oral Daily   multivitamin with minerals  1 tablet Oral Daily   rosuvastatin  20 mg Oral Daily   sacubitril-valsartan  1 tablet Oral BID   sotalol  160 mg Oral BID   Continuous Infusions:  cefTRIAXone (ROCEPHIN)  IV Stopped (02/08/21 2102)   vancomycin       LOS: 1 day        Camaya [employer] Gerome Apley, MD

## 2021-02-09 NOTE — Progress Notes (Addendum)
Pharmacy Antibiotic Note  John [name] is a 70 y.o. male admitted on 02/08/2021 with  wound infection with cellulitis .  Pharmacy has been consulted for vancomycin dosing. Patient also on ceftriaxone and metronidazole per team.    RN reported possible red man syndrome after vancomycin 8/23, given one time IV diphenhydramine and famotidine. Will run vancomycin slower.   8/24 Vancomycin 1500mg  Q 24 hr Scr used: 1.03 mg/dL Weight: 100.2 kg Vd coeff: 0.5 L/kg (BMI 29.9) > if use 0.7 L/kg Est AUC: 472                         > 2250mg  Q24 hr gives AUC 491  Will give 1750mg  Q 24hr given BMI borderline for Vd adjustment.    Plan: Decrease Vancomycin to 1750 mg q24h - run over 150 min for possible Red Man Syndrome Ceftriaxone and metronidazole per MD Monitor cultures, clinical status, renal fx, vanc levels Narrow abx as able and f/u duration   Temp (24hrs), Avg:98.2 F (36.8 C), Min:97.9 F (36.6 C), Max:98.4 F (36.9 C)  Recent Labs  Lab 02/08/21 1303 02/08/21 2000 02/09/21 0350  WBC 10.5  --  6.8  CREATININE 1.06  --  1.03  LATICACIDVEN 1.2 2.5*  --      Estimated Creatinine Clearance: 79.4 mL/min (by C-G formula based on SCr of 1.03 mg/dL).    Allergies  Allergen Reactions   Omega-3 Fatty Acids Hives and Itching   Benazepril Other (See Comments)    hyperkalemia   Fish Allergy Itching    Antimicrobials this admission: Ceftriaxone 8/23 >>  Vancomycin 8/23 >>  MTZ 8/23>>   Microbiology results: 8/23 Bcx: ngtd    Thank you for allowing pharmacy to be a part of this patient's care.   Benetta Spar, PharmD, BCPS, BCCP Clinical Pharmacist  Please check AMION for all [place] phone numbers After 10:00 PM, call [place] [phone]

## 2021-02-09 NOTE — Progress Notes (Signed)
Inpatient Diabetes Program Recommendations  AACE/ADA: New Consensus Statement on Inpatient Glycemic Control   Target Ranges:  Prepandial:   less than 140 mg/dL      Peak postprandial:   less than 180 mg/dL (1-2 hours)      Critically ill patients:  140 - 180 mg/dL   Results for MARQUET, FAIRCLOTH (MRN [id number]) as of 02/09/2021 07:40  Ref. Range 02/08/2021 21:56 02/09/2021 06:45  Glucose-Capillary Latest Ref Range: 70 - 99 mg/dL 237 (H) 120 (H)    Review of Glycemic Control  Diabetes history: DM2 Outpatient Diabetes medications: Levemir 46 units daily, Novolog 5-10 units BID, Jardiance 25 mg daily Current orders for Inpatient glycemic control: Semglee 40 units daily, Novolog 0-15 units TID with meals, Novolog 4 units TID with meals, Jardiance 25 mg QHS  NOTE: Noted consult for diabetes coordinator per lower extremity wound order set. Chart reviewed. Agree with current orders. Will follow along.  Addendum 02/09/21-Came by room x2 today to talk with patient but off floor; currently in MRI.   Thanks, Barnie Alderman, RN, MSN, CDE Diabetes Coordinator Inpatient Diabetes Program [phone] (Team Pager from 8am to 5pm)

## 2021-02-10 ENCOUNTER — Inpatient Hospital Stay (HOSPITAL_COMMUNITY): Payer: Medicare Other | Admitting: Anesthesiology

## 2021-02-10 ENCOUNTER — Inpatient Hospital Stay (HOSPITAL_COMMUNITY): Payer: Medicare Other

## 2021-02-10 ENCOUNTER — Encounter (HOSPITAL_COMMUNITY)
Admission: EM | Disposition: A | Discharge status: Home / Self Care | Payer: Self-pay | Source: Home / Self Care | Attending: Internal Medicine

## 2021-02-10 ENCOUNTER — Encounter (HOSPITAL_COMMUNITY): Payer: Self-pay | Admitting: Internal Medicine

## 2021-02-10 DIAGNOSIS — M86672 Other chronic osteomyelitis, left ankle and foot: Secondary | ICD-10-CM

## 2021-02-10 HISTORY — PX: AMPUTATION TOE: SHX6595

## 2021-02-10 LAB — GLUCOSE, CAPILLARY
Glucose-Capillary: 141 mg/dL — ABNORMAL HIGH (ref 70–99)
Glucose-Capillary: 98 mg/dL (ref 70–99)
Glucose-Capillary: 92 mg/dL (ref 70–99)
Glucose-Capillary: 89 mg/dL (ref 70–99)
Glucose-Capillary: 206 mg/dL — ABNORMAL HIGH (ref 70–99)
Glucose-Capillary: 208 mg/dL — ABNORMAL HIGH (ref 70–99)

## 2021-02-10 LAB — SURGICAL PCR SCREEN
MRSA, PCR: NEGATIVE
Staphylococcus aureus: NEGATIVE

## 2021-02-10 SURGERY — AMPUTATION, TOE
Anesthesia: Monitor Anesthesia Care | Site: Toe | Laterality: Left

## 2021-02-10 MED ORDER — CHLORHEXIDINE GLUCONATE 0.12 % MT SOLN: 15.0000 mL | OROMUCOSAL | Status: AC

## 2021-02-10 MED ORDER — BUPIVACAINE HCL (PF) 0.5 % IJ SOLN
INTRAMUSCULAR | Status: DC | PRN
  Administered 2021-02-10: 5 mL

## 2021-02-10 MED ORDER — FENTANYL CITRATE (PF) 100 MCG/2ML IJ SOLN: 25.0000 ug | INTRAMUSCULAR | Status: DC | PRN

## 2021-02-10 MED ORDER — PROPOFOL 10 MG/ML IV BOLUS
INTRAVENOUS | Status: DC | PRN
  Administered 2021-02-10: 30 mg via INTRAVENOUS

## 2021-02-10 MED ORDER — LACTATED RINGERS IV SOLN: INTRAVENOUS | Status: DC | PRN

## 2021-02-10 MED ORDER — PROPOFOL 500 MG/50ML IV EMUL
INTRAVENOUS | Status: DC | PRN
  Administered 2021-02-10: 100 ug/kg/min via INTRAVENOUS

## 2021-02-10 MED ORDER — LIDOCAINE HCL 2 % IJ SOLN
INTRAMUSCULAR | Status: AC
  Filled 2021-02-10: qty 20

## 2021-02-10 MED ORDER — ORAL CARE MOUTH RINSE: 15.0000 mL | Freq: Once | OROMUCOSAL | Status: AC

## 2021-02-10 MED ORDER — BUPIVACAINE HCL (PF) 0.5 % IJ SOLN
INTRAMUSCULAR | Status: AC
  Filled 2021-02-10: qty 30

## 2021-02-10 MED ORDER — LIDOCAINE HCL 1 % IJ SOLN
INTRAMUSCULAR | Status: DC | PRN
  Administered 2021-02-10: 5 mL

## 2021-02-10 MED ORDER — CHLORHEXIDINE GLUCONATE 0.12 % MT SOLN
OROMUCOSAL | Status: AC
  Administered 2021-02-10: 15 mL via OROMUCOSAL
  Filled 2021-02-10: qty 15

## 2021-02-10 MED ORDER — LACTATED RINGERS IV SOLN: INTRAVENOUS | Status: DC

## 2021-02-10 MED ORDER — LIDOCAINE HCL (PF) 1 % IJ SOLN
INTRAMUSCULAR | Status: AC
  Filled 2021-02-10: qty 30

## 2021-02-10 MED ORDER — 0.9 % SODIUM CHLORIDE (POUR BTL) OPTIME
TOPICAL | Status: DC | PRN
  Administered 2021-02-10: 1000 mL

## 2021-02-10 MED ORDER — CHLORHEXIDINE GLUCONATE 0.12 % MT SOLN
15.0000 mL | Freq: Once | OROMUCOSAL | Status: AC
  Administered 2021-02-10: 15 mL via OROMUCOSAL

## 2021-02-10 SURGICAL SUPPLY — 39 items
BAG COUNTER SPONGE SURGICOUNT (BAG) ×2 IMPLANT
BLADE SURG 15 STRL LF DISP TIS (BLADE) IMPLANT
BLADE SURG 15 STRL SS (BLADE)
BNDG ELASTIC 4X5.8 VLCR STR LF (GAUZE/BANDAGES/DRESSINGS) ×1 IMPLANT
BNDG GAUZE ELAST 4 BULKY (GAUZE/BANDAGES/DRESSINGS) ×2 IMPLANT
CHLORAPREP W/TINT 26 (MISCELLANEOUS) IMPLANT
COVER SURGICAL LIGHT HANDLE (MISCELLANEOUS) ×2 IMPLANT
CUFF TOURN SGL QUICK 18X4 (TOURNIQUET CUFF) ×2 IMPLANT
CUFF TOURN SGL QUICK 24 (TOURNIQUET CUFF)
CUFF TRNQT CYL 24X4X16.5-23 (TOURNIQUET CUFF) IMPLANT
DRSG PAD ABDOMINAL 8X10 ST (GAUZE/BANDAGES/DRESSINGS) ×2 IMPLANT
ELECT REM PT RETURN 9FT ADLT (ELECTROSURGICAL)
ELECTRODE REM PT RTRN 9FT ADLT (ELECTROSURGICAL) IMPLANT
GAUZE SPONGE 4X4 12PLY STRL (GAUZE/BANDAGES/DRESSINGS) ×2 IMPLANT
GAUZE XEROFORM 1X8 LF (GAUZE/BANDAGES/DRESSINGS) ×2 IMPLANT
GLOVE SRG 8 PF TXTR STRL LF DI (GLOVE) ×1 IMPLANT
GLOVE SURG ENC MOIS LTX SZ8 (GLOVE) ×2 IMPLANT
GLOVE SURG UNDER POLY LF SZ8 (GLOVE) ×2
GOWN STRL REUS W/ TWL LRG LVL3 (GOWN DISPOSABLE) ×2 IMPLANT
GOWN STRL REUS W/TWL LRG LVL3 (GOWN DISPOSABLE) ×4
KIT BASIN OR (CUSTOM PROCEDURE TRAY) ×2 IMPLANT
KIT TURNOVER KIT B (KITS) ×2 IMPLANT
NDL PRECISIONGLIDE 27X1.5 (NEEDLE) ×1 IMPLANT
NEEDLE PRECISIONGLIDE 27X1.5 (NEEDLE) ×2 IMPLANT
NS IRRIG 1000ML POUR BTL (IV SOLUTION) ×2 IMPLANT
PACK ORTHO EXTREMITY (CUSTOM PROCEDURE TRAY) ×2 IMPLANT
PAD ARMBOARD 7.5X6 YLW CONV (MISCELLANEOUS) ×4 IMPLANT
PAD CAST 4YDX4 CTTN HI CHSV (CAST SUPPLIES) ×1 IMPLANT
PADDING CAST COTTON 4X4 STRL (CAST SUPPLIES) ×2
SOL PREP POV-IOD 4OZ 10% (MISCELLANEOUS) ×2 IMPLANT
STAPLER VISISTAT 35W (STAPLE) IMPLANT
SUT PROLENE 4 0 PS 2 18 (SUTURE) IMPLANT
SUT VIC AB 3-0 PS2 18 (SUTURE) IMPLANT
SUT VICRYL 4-0 PS2 18IN ABS (SUTURE) IMPLANT
SYR CONTROL 10ML LL (SYRINGE) ×2 IMPLANT
TOWEL GREEN STERILE (TOWEL DISPOSABLE) ×2 IMPLANT
TOWEL GREEN STERILE FF (TOWEL DISPOSABLE) ×2 IMPLANT
TUBE CONNECTING 12X1/4 (SUCTIONS) ×2 IMPLANT
YANKAUER SUCT BULB TIP NO VENT (SUCTIONS) IMPLANT

## 2021-02-10 NOTE — Progress Notes (Signed)
PROGRESS NOTE    John [name]  HYW:[id number] DOB: 11-Jun-1951 DOA: 02/08/2021 PCP: [name], MD    Brief Narrative:  John [name] was admitted to the hospital with the working diagnosis of left diabetic foot infection.    70 yo male with the past medical history of CAD, HTN, T2DM who presented with left foot infection. Positive erythema and edema, predominantly forefoot and second toe, did not respond to outpatient treatment with oral doxycycline. He was referred to the hospital by his podiatry for IV antibiotic therapy and foot MRI.  On his initial physical examination blood pressure 125/85, heart rate 70, respiratory rate 18, temperature 98.2, oxygen saturation 97%, his lungs are clear to auscultation bilaterally, heart S1-S2, present, rhythmic, soft abdomen, left foot with positive edema and erythema, second toe with ulcerated lesion in the medial aspect.   Sodium 141, potassium 4.0, chloride 108, bicarb 26, glucose 218, BUN 13, creatinine 1.0, white count 10.5, hemoglobin 17.3, hematocrit 53.5, platelets 194. SARS COVID-19 negative.  Patient was placed on IV broad spectrum antibiotic therapy and further work up with left foot MRI with positive osteomyelitis on the second toe.  Plan for surgical intervention today.   Assessment & Plan:   Principal Problem:   Diabetic infection of left foot ([place]) Active Problems:   CAD, multiple prior RCA PCI's. Last cath 2010, Myoview low risk Oct 2012   DM type 2, goal HbA1c < 7% (HCC)   HTN (hypertension)   Chronic systolic CHF (congestive heart failure) (HCC)   History of ventricular tachycardia     Osteomyelitis of the second toe distal and middle phalanx with adjacent soft tissue ulcer.  Patient has been afebrile, pain is well controlled. WBC yesterday was 6,8   Plan to contin with IV ceftriaxone and vancomycin Surgical intervention today, left foot second toe amputation.    2. T2DM/ dyslipidemia. Capillary glucose 139, 167,  141. Continue with reduced dose of basal insulin, patient is NPO for surgical procedure today. Continue insulin sliding scale for glucose cover and monitoring.  On empagliflozin.    Continue with rosuvastatin.   3. Chronic systolic heart failure/ CAD Device interrogation per EP, positive VT, continue mexilitine with adjustment in the dose per EP.  No clinical signs of decompensation, continue with entresto, metoprolol and emapgliflozin.    4. Paroxysmal Atrial flutter. Rate control with sotalol, holding anticoagulation in preparation for surgical intervention.     5. Hypokalemia.  Electrolytes have been corrected.   6. COPD/ asthma. No clinical signs of exacerbation, continue with budesonide.  Continue with montelukast.   Patient continue to be at high risk for worsening osteomyelitis.   Status is: Inpatient  Remains inpatient appropriate because:IV treatments appropriate due to intensity of illness or inability to take PO  Dispo: The patient is from: Home              Anticipated d/c is to: Home              Patient currently is not medically stable to d/c.   Difficult to place patient No  DVT prophylaxis: On hold apixaban   Code Status:    full  Family Communication:   No family at the bedside,     Nutrition Status: Nutrition Problem: Increased nutrient needs Etiology: wound healing Signs/Symptoms: estimated needs Interventions: Refer to RD note for recommendations, MVI     Consultants:  Orthopedics    Antimicrobials:  Vancomycin and ceftriaxone     Subjective: Patient with no  chest pain or dyspnea, left foot continue with edema and second toe erythema, no significant local pain.   Objective: Vitals:   02/09/21 1933 02/09/21 2040 02/10/21 0732 02/10/21 0740  BP:  115/88  117/88  Pulse: 72 78  69  Resp: 17 16  17   Temp:  97.8 F (36.6 C)    TempSrc:  Oral    SpO2: 96% 100% 99% 98%    Intake/Output Summary (Last 24 hours) at 02/10/2021 1042 Last  data filed at 02/10/2021 0500 Gross per 24 hour  Intake 546.71 ml  Output --  Net 546.71 ml   There were no vitals filed for this visit.  Examination:   General: Not in pain or dyspnea.  Neurology: Awake and alert, non focal  E ENT: no pallor, no icterus, oral mucosa moist Cardiovascular: No JVD. S1-S2 present, rhythmic, no gallops, rubs, or murmurs. No lower extremity edema. Pulmonary: positive breath sounds bilaterally, adequate air movement, no wheezing, rhonchi or rales. Gastrointestinal. Abdomen soft and non tender Skin. No rashes Musculoskeletal: no joint deformities     Data Reviewed: I have personally reviewed following labs and imaging studies  CBC: Recent Labs  Lab 02/08/21 1303 02/09/21 0350  WBC 10.5 6.8  NEUTROABS 8.4*  --   HGB 17.3* 14.5  HCT 53.5* 45.5  MCV 94.7 94.0  PLT 194 924   Basic Metabolic Panel: Recent Labs  Lab 02/08/21 1303 02/09/21 0350  NA 141 142  K 4.0 3.5  CL 108 109  CO2 26 27  GLUCOSE 218* 139*  BUN 13 11  CREATININE 1.06 1.03  CALCIUM 9.2 8.6*   GFR: Estimated Creatinine Clearance: 79.4 mL/min (by C-G formula based on SCr of 1.03 mg/dL). Liver Function Tests: Recent Labs  Lab 02/08/21 1303  AST 20  ALT 20  ALKPHOS 87  BILITOT 1.0  PROT 6.1*  ALBUMIN 3.3*   No results for input(s): LIPASE, AMYLASE in the last 168 hours. No results for input(s): AMMONIA in the last 168 hours. Coagulation Profile: No results for input(s): INR, PROTIME in the last 168 hours. Cardiac Enzymes: No results for input(s): CKTOTAL, CKMB, CKMBINDEX, TROPONINI in the last 168 hours. BNP (last 3 results) No results for input(s): PROBNP in the last 8760 hours. HbA1C: Recent Labs    02/08/21 1939  HGBA1C 8.0*   CBG: Recent Labs  Lab 02/09/21 0645 02/09/21 1112 02/09/21 1604 02/09/21 2041 02/10/21 0620  GLUCAP 120* 169* 139* 167* 141*   Lipid Profile: No results for input(s): CHOL, HDL, LDLCALC, TRIG, CHOLHDL, LDLDIRECT in the  last 72 hours. Thyroid Function Tests: No results for input(s): TSH, T4TOTAL, FREET4, T3FREE, THYROIDAB in the last 72 hours. Anemia Panel: No results for input(s): VITAMINB12, FOLATE, FERRITIN, TIBC, IRON, RETICCTPCT in the last 72 hours.    Radiology Studies: I have reviewed all of the imaging during this hospital visit personally     Scheduled Meds:  budesonide (PULMICORT) nebulizer solution  1 mg Nebulization BID   empagliflozin  25 mg Oral QHS   insulin aspart  0-15 Units Subcutaneous TID WC   insulin aspart  4 Units Subcutaneous TID WC   insulin glargine-yfgn  20 Units Subcutaneous QHS   metoprolol succinate  100 mg Oral QHS   mexiletine  300 mg Oral Q12H   montelukast  10 mg Oral Daily   multivitamin with minerals  1 tablet Oral Daily   rosuvastatin  20 mg Oral Daily   sacubitril-valsartan  1 tablet Oral BID   sotalol  160 mg Oral BID   Continuous Infusions:  cefTRIAXone (ROCEPHIN)  IV 2 g (02/09/21 1951)   vancomycin 1,750 mg (02/09/21 2227)     LOS: 2 days        Alania Overholt Gerome Apley, MD

## 2021-02-10 NOTE — Progress Notes (Signed)
Orthopedic Tech Progress Note Patient Details:  John [name] [birth year] [id number] Large Postop shoe was delivered to OR Desk Ortho Devices Type of Ortho Device: Postop shoe/boot Ortho Device/Splint Interventions: John [name] [name] 02/10/2021, 5:39 PM

## 2021-02-10 NOTE — Brief Op Note (Signed)
02/10/2021  7:21 PM  PATIENT:  John [name]  70 y.o. male  PRE-OPERATIVE DIAGNOSIS:  Osteomyelitis  POST-OPERATIVE DIAGNOSIS:  Osteomyelitis  PROCEDURE:  Procedure(s): AMPUTATION  LEFT SECOND TOE (Left)  SURGEON:  Surgeon(s) and Role:    Edrick Kins, DPM - Primary  PHYSICIAN ASSISTANT:   ASSISTANTS: none   ANESTHESIA:   local and IV sedation  EBL:  0 mL   BLOOD ADMINISTERED:none  DRAINS: none   LOCAL MEDICATIONS USED:  MARCAINE    and XYLOCAINE   SPECIMEN:  No Specimen  DISPOSITION OF SPECIMEN:  N/A  COUNTS:  YES  TOURNIQUET:   Total Tourniquet Time Documented: Calf (Left) - 11 minutes Total: Calf (Left) - 11 minutes   DICTATION: .Viviann Spare Dictation  PLAN OF CARE: Admit to inpatient   PATIENT DISPOSITION:  PACU - hemodynamically stable.   Delay start of Pharmacological VTE agent (>24hrs) due to surgical blood loss or risk of bleeding: yes

## 2021-02-10 NOTE — Transfer of Care (Signed)
Immediate Anesthesia Transfer of Care Note  Patient: John [name]  Procedure(s) Performed: AMPUTATION  LEFT SECOND TOE (Left: Toe)  Patient Location: PACU  Anesthesia Type:MAC  Level of Consciousness: awake, alert  and oriented  Airway & Oxygen Therapy: Patient Spontanous Breathing  Post-op Assessment: Report given to RN and Post -op Vital signs reviewed and stable  Post vital signs: Reviewed and stable  Last Vitals:  Vitals Value Taken Time  BP 119/75 02/10/21 1803  Temp    Pulse 69 02/10/21 1808  Resp 17 02/10/21 1808  SpO2 98 % 02/10/21 1808  Vitals shown include unvalidated device data.  Last Pain:  Vitals:   02/10/21 1703  TempSrc: Oral  PainSc: 0-No pain      Patients Stated Pain Goal: 0 (13/24/40 1027)  Complications: No notable events documented.

## 2021-02-10 NOTE — Anesthesia Preprocedure Evaluation (Signed)
Anesthesia Evaluation  Patient identified by MRN, date of birth, ID band Patient awake    Reviewed: Allergy & Precautions, NPO status , Patient's Chart, lab work & pertinent test results  Airway Mallampati: II  TM Distance: >3 FB Neck ROM: Full    Dental  (+) Teeth Intact   Pulmonary asthma , sleep apnea and Continuous Positive Airway Pressure Ventilation , COPD,  COPD inhaler, former smoker,    Pulmonary exam normal        Cardiovascular hypertension, Pt. on medications and Pt. on home beta blockers + CAD, + Past MI, + Cardiac Stents and +CHF  + dysrhythmias Atrial Fibrillation and Ventricular Tachycardia  Rhythm:Regular Rate:Normal     Neuro/Psych negative neurological ROS  negative psych ROS   GI/Hepatic Neg liver ROS, GERD  Medicated,  Endo/Other  diabetes, Type 2, Insulin Dependent  Renal/GU negative Renal ROS  negative genitourinary   Musculoskeletal negative musculoskeletal ROS (+)   Abdominal (+)  Abdomen: soft. Bowel sounds: normal.  Peds  Hematology negative hematology ROS (+)   Anesthesia Other Findings   Reproductive/Obstetrics                             Anesthesia Physical  Anesthesia Plan  ASA: 3  Anesthesia Plan: MAC   Post-op Pain Management:    Induction: Intravenous  PONV Risk Score and Plan: 1 and Propofol infusion, TIVA and Treatment may vary due to age or medical condition  Airway Management Planned: Natural Airway, Nasal Cannula and Simple Face Mask  Additional Equipment: None  Intra-op Plan:   Post-operative Plan:   Informed Consent: I have reviewed the patients History and Physical, chart, labs and discussed the procedure including the risks, benefits and alternatives for the proposed anesthesia with the patient or authorized representative who has indicated his/her understanding and acceptance.       Plan Discussed with:   Anesthesia Plan  Comments:         Anesthesia Quick Evaluation

## 2021-02-10 NOTE — Op Note (Signed)
   OPERATIVE REPORT Patient name: John [name] MRN: [id number] DOB: 1951/02/25  DOS:  02/10/2021  Preop Dx:  Postop Dx: same  Procedure:  1.  Osteomyelitis left second toe  Surgeon: Edrick Kins DPM  Anesthesia: 50-50 mixture of 2% lidocaine plain with 0.5% Marcaine plain totaling 10 mL infiltrated in the patient's left lower extremity in a digital block fashion  Hemostasis: Ankle tourniquet inflated to a pressure of 286mmHg after esmarch exsanguination   EBL: Minimal mL Materials: None Injectables: None Pathology: None  Condition: The patient tolerated the procedure and anesthesia well. No complications noted or reported   Justification for procedure: The patient is a 70 y.o. male who presents today for surgical correction of osteomyelitis of the left second toe. All conservative modalities of been unsuccessful in providing any sort of satisfactory alleviation of symptoms with the patient. The patient was told benefits as well as possible side effects of the surgery. The patient consented for surgical correction. The patient consent form was reviewed. All patient questions were answered. No guarantees were expressed or implied. The patient and the surgeon boson the patient consent form with the witness present and placed in the patient's chart.   Procedure in Detail: The patient was brought to the operating room, patient stayed in the hospital bed at which time an aseptic scrub and drape were performed about the patient's respective lower extremity after anesthesia was induced as described above. Attention was then directed to the surgical area where procedure number one commenced.  Procedure #1: Amputation left second toe A racquet type incision was planned and made around the second MTPJ of the left foot.  Incision was carried down to the level of joint capsule.  The left second toe was grasped with a perforating towel clamp and distracted distally while the soft tissue  around the joint capsule was sharply resected away using a surgical 15 scalpel.  The second toe was removed in toto and placed in a sterile specimen container.  The extensor and flexor tendons were distracted distally and cut as far proximal as could be visualized.  Irrigation was utilized.  Cultures taken.  Primary closure was obtained using 3-0 Prolene suture.  Dry sterile compressive dressings were then applied to all previously mentioned incision sites about the patient's lower extremity. The tourniquet which was used for hemostasis was deflated. All normal neurovascular responses including pink color and warmth returned all the digits of patient's lower extremity.  The patient was then transferred from the operating room to the recovery room having tolerated the procedure and anesthesia well. All vital signs are stable. After a brief stay in the recovery room the patient was readmitted to inpatient room with adequate prescriptions for analgesia. Verbal as well as written instructions were provided for the patient regarding wound care. The patient is to keep the dressings clean dry and intact until they are to follow surgeon Dr. Daylene Katayama in the office upon discharge.   Edrick Kins, DPM [facility]  Dr. Edrick Kins, DPM    [address], [place] [address]                Office [phone]  Fax [phone]

## 2021-02-10 NOTE — Interval H&P Note (Signed)
History and Physical Interval Note:  02/10/2021 12:30 PM  John [name]  has presented today for surgery, with the diagnosis of Osteomyelitis.  The various methods of treatment have been discussed with the patient and family. After consideration of risks, benefits and other options for treatment, the patient has consented to  Procedure(s): AMPUTATION  LEFT SECOND TOE (Left) as a surgical intervention.  The patient's history has been reviewed, patient examined, no change in status, stable for surgery.  I have reviewed the patient's chart and labs.  Questions were answered to the patient's satisfaction.     Edrick Kins

## 2021-02-11 ENCOUNTER — Encounter (HOSPITAL_COMMUNITY): Payer: Self-pay | Admitting: Podiatry

## 2021-02-11 LAB — GLUCOSE, CAPILLARY
Glucose-Capillary: 144 mg/dL — ABNORMAL HIGH (ref 70–99)
Glucose-Capillary: 217 mg/dL — ABNORMAL HIGH (ref 70–99)

## 2021-02-11 LAB — CBC WITH DIFFERENTIAL/PLATELET
Abs Immature Granulocytes: 0.07 10*3/uL (ref 0.00–0.07)
Basophils Absolute: 0.1 10*3/uL (ref 0.0–0.1)
Basophils Relative: 1 %
Eosinophils Absolute: 0.2 10*3/uL (ref 0.0–0.5)
Eosinophils Relative: 3 %
HCT: 49.7 % (ref 39.0–52.0)
Hemoglobin: 15.7 g/dL (ref 13.0–17.0)
Immature Granulocytes: 1 %
Lymphocytes Relative: 20 %
Lymphs Abs: 1.3 10*3/uL (ref 0.7–4.0)
MCH: 29.8 pg (ref 26.0–34.0)
MCHC: 31.6 g/dL (ref 30.0–36.0)
MCV: 94.3 fL (ref 80.0–100.0)
Monocytes Absolute: 0.4 10*3/uL (ref 0.1–1.0)
Monocytes Relative: 7 %
Neutro Abs: 4.4 10*3/uL (ref 1.7–7.7)
Neutrophils Relative %: 68 %
Platelets: 169 10*3/uL (ref 150–400)
RBC: 5.27 MIL/uL (ref 4.22–5.81)
RDW: 13.8 % (ref 11.5–15.5)
WBC: 6.5 10*3/uL (ref 4.0–10.5)
nRBC: 0 % (ref 0.0–0.2)

## 2021-02-11 LAB — BASIC METABOLIC PANEL
Anion gap: 8 (ref 5–15)
BUN: 14 mg/dL (ref 8–23)
CO2: 26 mmol/L (ref 22–32)
Calcium: 8.8 mg/dL — ABNORMAL LOW (ref 8.9–10.3)
Chloride: 107 mmol/L (ref 98–111)
Creatinine, Ser: 0.99 mg/dL (ref 0.61–1.24)
GFR, Estimated: >60 mL/min (ref >60)
Glucose, Bld: 131 mg/dL — ABNORMAL HIGH (ref 70–99)
Potassium: 4 mmol/L (ref 3.5–5.1)
Sodium: 141 mmol/L (ref 135–145)

## 2021-02-11 LAB — MAGNESIUM: Magnesium: 2.2 mg/dL (ref 1.7–2.4)

## 2021-02-11 MED ORDER — HYDROCODONE-ACETAMINOPHEN 5-325 MG PO TABS: 1.0000 | ORAL_TABLET | Freq: Four times a day (QID) | ORAL | 0 refills | Status: DC | PRN

## 2021-02-11 MED ORDER — CEPHALEXIN 500 MG PO CAPS: 500.0000 mg | ORAL_CAPSULE | Freq: Three times a day (TID) | ORAL | Status: DC

## 2021-02-11 MED ORDER — HYDROCODONE-ACETAMINOPHEN 5-325 MG PO TABS
1.0000 | ORAL_TABLET | ORAL | Status: DC | PRN
Start: 2021-02-11 — End: 2021-02-11

## 2021-02-11 MED ORDER — CEPHALEXIN 500 MG PO CAPS: 500.0000 mg | ORAL_CAPSULE | Freq: Three times a day (TID) | ORAL | 0 refills | Status: DC

## 2021-02-11 NOTE — Progress Notes (Signed)
Late Entry Patient discharged to home, AVS was reviewed and patient has all of his belongings. Patient was discharged from tele. Patient is aware he should keep dressing clean dry and intact until follow up appointment per MD Evans.   After patient left, antibiotics were ordered and sent to the patient's pharmacy. Patient was called and notified, all questions answered.

## 2021-02-11 NOTE — Addendum Note (Signed)
Addendum  created 02/11/21 1558 by Suzette Battiest, MD   Intraprocedure Staff edited

## 2021-02-11 NOTE — Consult Note (Signed)
[facility] Inpatient Consult   02/11/2021  John [name] May 19, 1951 [id number]  [facility] [ACO] Patient: CMS DCE   Patient was screened for [facility] CM) services. This patient is also in an [facility] which has a chronic disease management Embedded Care Management team.    Referral will be placed for chronic case management follow-up at [facility] team.  Of note, [facility] Care Management services does not replace or interfere with any services that are arranged by inpatient case management or social work.   Netta Cedars, MSN, [facility] Liaison Nurse Mobile Phone [phone]  Toll free office [phone]

## 2021-02-11 NOTE — Discharge Summary (Signed)
Physician Discharge Summary  John [name] BZJ:[id number] DOB: [birth year] DOA: 02/08/2021  PCP: [name], MD  Admit date: 02/08/2021 Discharge date: 02/11/2021  Admitted From: home  Disposition:  Home   Recommendations for Outpatient Follow-up and new medication changes:  Follow up with [name] in 7 to 10 days.   Verbal as well as written instructions were provided for the patient regarding wound care. The patient is to keep the dressings clean dry and intact until they are to follow surgeon Dr. Daylene Katayama in the office upon discharge. Pain control with as needed acetaminophen and oxycodone.  Plan to resume taking apixaban on 02/12/21  Home Health: no   Equipment/Devices: no    Discharge Condition: stable  CODE STATUS: full  Diet recommendation:  heart healthy   Brief/Interim Summary: [name] was admitted to the hospital with the working diagnosis of left diabetic foot infection.    70 yo male with the past medical history of CAD, HTN, T2DM, paroxysmal atrial flutter who presented with left foot infection. Positive erythema and edema, predominantly forefoot and second toe, did not respond to outpatient treatment with oral doxycycline. He was referred to the hospital by his podiatry for IV antibiotic therapy and foot MRI.  On his initial physical examination blood pressure 125/85, heart rate 70, respiratory rate 18, temperature 98.2, oxygen saturation 97%, his lungs were clear to auscultation bilaterally, heart S1-S2, present, rhythmic, soft abdomen, left foot with positive edema and erythema, second toe with ulcerated lesion in the medial aspect.   Sodium 141, potassium 4.0, chloride 108, bicarb 26, glucose 218, BUN 13, creatinine 1.0, white count 10.5, hemoglobin 17.3, hematocrit 53.5, platelets 194. SARS COVID-19 negative.   Patient was placed on IV broad spectrum antibiotic therapy and further work up with left foot MRI showed positive osteomyelitis on the second  toe.   02/10/21 underwent amputation left second toe with good toleration.  Plan to follow up as outpatient with podiatry in 2 weeks.   Osteomyelitis of the second toe distal and middle phalanx with adjacent soft tissue ulcer. Group B streptococcus.  Patient was admitted to the medical ward, he received broad-spectrum intravenous antibiotic therapy with ceftriaxone and vancomycin. His blood cultures remain no growth.  Considering osteomyelitis patient underwent left second toe amputation with no major complications. Patient will follow up podiatry as an outpatient.  Post surgery he had bleeding at his amputation site that required change of gauze. Recommend resume apixaban on 02/12/21  2.  Type 2 diabetes mellitus, dyslipidemia.  Patient received insulin therapy, lower dose than his usual home regimen with good toleration. At discharge his fasting glucose was 131. Patient will resume his home regimen with close follow-up on capillary glucose measurements.  Continue rosuvastatin and empagliflozin.  3.  Chronic systolic heart failure.  Coronary artery disease.  Patient has a defibrillator in place which was interrogated by EP.  He was noted to have asymptomatic ventricular tachycardia.  [name] was increased with good toleration, instruction to follow-up as an outpatient.  No clinical signs of acute heart failure decompensation, continue Entresto, metoprolol and empagliflozin.  4.  Paroxysmal atrial flutter.  He remains well controlled with sotalol, anticoagulation was held in the setting of surgical intervention. Plan to resume apixaban 02/12/2021.  5.  Hypokalemia.  His electrolytes were corrected.  At discharge sodium 141, potassium 4.0, chloride 107, bicarb 26, glucose 131, BUN 14, creatinine 0.99, magnesium 2.2.  6.  COPD/asthma.  No signs of acute exacerbation, continue budesonide and montelukast.  Discharge Diagnoses:  Principal Problem:   Diabetic infection of left foot  ([place]) Active Problems:   CAD, multiple prior RCA PCI's. Last cath 2010, Myoview low risk Oct 2012   DM type 2, goal HbA1c < 7% ([facility])   HTN (hypertension)   Chronic systolic CHF (congestive heart failure) ([place])   History of ventricular tachycardia    Discharge Instructions   Allergies as of 02/11/2021       Reactions   Omega-3 Fatty Acids Hives, Itching   Benazepril Other (See Comments)   hyperkalemia   Fish Allergy Itching        Medication List     TAKE these medications    acetaminophen 500 MG tablet Commonly known as: TYLENOL Take 500 mg by mouth every 6 (six) hours as needed for moderate pain or headache.   albuterol 108 (90 Base) MCG/ACT inhaler Commonly known as: VENTOLIN HFA Inhale 2 puffs into the lungs every 6 (six) hours as needed for wheezing or shortness of breath.   Arnuity Ellipta 200 MCG/ACT Aepb Generic drug: Fluticasone Furoate Inhale 1 puff into the lungs daily.   Eliquis 5 MG Tabs tablet Generic drug: apixaban TAKE 1 TABLET BY MOUTH TWICE A DAY What changed: how much to take   Entresto 24-26 MG Generic drug: sacubitril-valsartan TAKE 1 TABLET BY MOUTH TWICE A DAY   HYDROcodone-acetaminophen 5-325 MG tablet Commonly known as: NORCO/VICODIN Take 1 tablet by mouth every 6 (six) hours as needed for severe pain.   Jardiance 25 MG Tabs tablet Generic drug: empagliflozin TAKE 1 TABLET BY MOUTH EVERY DAY What changed: how much to take   Levemir FlexTouch 100 UNIT/ML FlexPen Generic drug: insulin detemir INJECT 46 UNITS INTO THE SKIN DAILY. DX:E11.9 What changed: See the new instructions.   metoprolol succinate 100 MG 24 hr tablet Commonly known as: TOPROL-XL Take 1 tablet (100 mg total) by mouth daily. Take with or immediately following a meal.   mexiletine 150 MG capsule Commonly known as: MEXITIL Take 2 capsules (300 mg total) by mouth every 12 (twelve) hours. What changed:  medication strength how much to take   montelukast 10 MG  tablet Commonly known as: SINGULAIR TAKE 1 TABLET BY MOUTH EVERY DAY What changed: when to take this   nitroGLYCERIN 0.4 MG SL tablet Commonly known as: NITROSTAT PLACE 1 TABLET (0.4 MG TOTAL) UNDER THE TONGUE EVERY 5 (FIVE) MINUTES AS NEEDED FOR CHEST PAIN.   NovoFine 32G X 6 MM Misc Generic drug: Insulin Pen Needle 1 each by Other route daily.   NovoLOG FlexPen 100 UNIT/ML FlexPen Generic drug: insulin aspart INJECT 5-20 UNITS SUBCUTANEOUSLY WITH LUNCH AND DINNER What changed:  how much to take how to take this when to take this additional instructions   OneTouch Ultra test strip Generic drug: glucose blood USE TO CHECK BLOOD SUGAR 3 TIMES A DAY (E11.9)   rosuvastatin 20 MG tablet Commonly known as: CRESTOR TAKE 1 TABLET BY MOUTH EVERY DAY   sotalol 160 MG tablet Commonly known as: BETAPACE Take 1 tablet (160 mg total) by mouth 2 (two) times daily.        Follow-up Information     Shirley Friar, PA-C Follow up.   Specialty: Physician Assistant Why: 02/22/2021 @ 8:20AM (for Dr. Curt Bears) Contact information: [address] [place] [address] [phone]         [name], MD Follow up.   Specialty: Family Medicine Contact information: [address]  [place] [address] [phone]                Allergies  Allergen Reactions   Omega-3 Fatty Acids Hives and Itching   Benazepril Other (See Comments)    hyperkalemia   Fish Allergy Itching    Consultations: Orthopedics    Procedures/Studies: DG Chest 2 View  Result Date: 01/17/2021 CLINICAL DATA:  Tachycardia, hypotension, palpitations EXAM: CHEST - 2 VIEW COMPARISON:  12/28/2020 FINDINGS: Frontal and lateral views of the chest demonstrates stable dual lead pacemaker. Cardiac silhouette is unremarkable. There is central vascular congestion without acute airspace disease, effusion, or pneumothorax. A 2 cm nodule projects over the left posterior sixth  rib. No acute bony abnormalities. IMPRESSION: 1. 2 cm nodule within the mid left hemithorax. Follow-up chest CT recommended for further characterization. 2. Central vascular congestion without overt edema. Electronically Signed   By: Randa Ngo M.D.   On: 01/17/2021 22:48   MR FOOT LEFT WO CONTRAST  Result Date: 02/09/2021 CLINICAL DATA:  Foot swelling, diabetic, osteomyelitis suspected, xray done EXAM: MRI OF THE LEFT FOOT WITHOUT CONTRAST TECHNIQUE: Multiplanar, multisequence MR imaging of the left foot was performed. No intravenous contrast was administered. COMPARISON:  Radiograph 02/08/2021 FINDINGS: Bones/Joint/Cartilage There is increased T2 signal with confluent low T1 signal in the middle and distal phalanx of the second toe. Hallux valgus with moderate first MTP degenerative change. Ligaments Intact Lisfranc ligament. Muscles and Tendons Diffuse muscle atrophy in the foot. Diffuse intramuscular edema as is commonly seen in diabetics. Soft tissues No soft tissue mass or focal fluid collection. Generalized soft tissue swelling of the foot, most prominent dorsally. IMPRESSION: Osteomyelitis of the second toe distal and middle phalanx with adjacent soft tissue ulcer. No evidence of soft tissue abscess. Electronically Signed   By: Maurine Simmering M.D.   On: 02/09/2021 14:17   DG Chest Portable 1 View  Result Date: 01/26/2021 CLINICAL DATA:  Chest pain EXAM: PORTABLE CHEST 1 VIEW COMPARISON:  01/17/2021 FINDINGS: Unchanged position of left chest device and leads. Unchanged cardiac and mediastinal contours. Low lung volumes with vascular crowding. The previously noted 2 cm nodule projecting over the left upper lung is less well seen on the present exam secondary to the position of the cardiac device, however it likely remains present. No pleural effusion or focal consolidative opacity. The visualized skeletal structures are unremarkable. IMPRESSION: 1. Visualization of a previously noted 2 cm nodule in  the left mid hemithorax is limited by the position of the cardiac device. A chest CT is once again recommended. 2.  Low lung volumes with vascular crowding. Electronically Signed   By: Merilyn Baba MD   On: 01/26/2021 13:31   DG Foot Complete Left  Result Date: 02/10/2021 CLINICAL DATA:  Osteomyelitis, left second digit amputation EXAM: LEFT FOOT - COMPLETE 3+ VIEW COMPARISON:  02/08/2021 FINDINGS: Interval amputation of the left second digit distal to the MTP joint. Moderate hallux valgus deformity again identified with associated degenerative change of the MTP joint. No acute fracture or dislocation. No osseous erosions. Moderate midfoot degenerative arthritis noted. Moderate soft tissue swelling of the left forefoot noted. IMPRESSION: Interval left second digit amputation distal to the MTP joint. Associated soft tissue swelling. Electronically Signed   By: Fidela Salisbury M.D.   On: 02/10/2021 20:26   DG Foot Complete Left  Result Date: 02/08/2021 CLINICAL DATA:  Pain and edema EXAM: LEFT FOOT - COMPLETE 3+ VIEW COMPARISON:  None. FINDINGS: No acute fracture or dislocation. Changes of  the midfoot and IP joints. No destructive osseous changes to suggest osteomyelitis. Soft tissue swelling of the forefoot IMPRESSION: No evidence of osteomyelitis. Electronically Signed   By: Yetta Glassman M.D.   On: 02/08/2021 13:54   DG Foot Complete Left  Result Date: 02/04/2021 Please see detailed radiograph report in office note.  VAS [place] ABI WITH/WO TBI  Result Date: 02/09/2021  LOWER EXTREMITY DOPPLER STUDY Patient Name:  LELAN CUSH  Date of Exam:   02/09/2021 Medical Rec #: [id number]         Accession #:    [id number] Date of Birth: 23-Oct-1950         Patient Gender: M Patient Age:   24 years Exam Location:  [facility] Procedure:      VAS [place] ABI WITH/WO TBI Referring Phys: Jennette Kettle --------------------------------------------------------------------------------  Indications: Ulceration LT  2nd toe, worsening cellulitis. High Risk Factors: Hypertension, hyperlipidemia, Diabetes, past history of                    smoking, prior MI, coronary artery disease.  Comparison Study: No prior studies. Performing Technologist: Darlin Coco RDMS RVT  Examination Guidelines: A complete evaluation includes at minimum, Doppler waveform signals and systolic blood pressure reading at the level of bilateral brachial, anterior tibial, and posterior tibial arteries, when vessel segments are accessible. Bilateral testing is considered an integral part of a complete examination. Photoelectric Plethysmograph (PPG) waveforms and toe systolic pressure readings are included as required and additional duplex testing as needed. Limited examinations for reoccurring indications may be performed as noted.  ABI Findings: +---------+------------------+-----+---------+--------+ Right    Rt Pressure (mmHg)IndexWaveform Comment  +---------+------------------+-----+---------+--------+ Brachial 124                    triphasic         +---------+------------------+-----+---------+--------+ PTA      158               1.26 triphasic         +---------+------------------+-----+---------+--------+ DP       145               1.16 triphasic         +---------+------------------+-----+---------+--------+ Great Toe78                0.62 Abnormal          +---------+------------------+-----+---------+--------+ +---------+------------------+-----+---------+-------+ Left     Lt Pressure (mmHg)IndexWaveform Comment +---------+------------------+-----+---------+-------+ Brachial 125                    triphasic        +---------+------------------+-----+---------+-------+ PTA      139               1.11 triphasic        +---------+------------------+-----+---------+-------+ DP       149               1.19 triphasic        +---------+------------------+-----+---------+-------+ Great Toe88                 0.70 Normal           +---------+------------------+-----+---------+-------+  Summary: Right: Resting right ankle-brachial index is within normal range. No evidence of significant right lower extremity arterial disease. The right toe-brachial index is abnormal. Left: Resting left ankle-brachial index is within normal range. No evidence of significant left lower extremity arterial disease. The left toe-brachial index is normal.  *See table(s)  above for measurements and observations.  Electronically signed by Harold Barban MD on 02/09/2021 at 8:07:27 PM.    Final    ECHOCARDIOGRAM COMPLETE  Result Date: 01/18/2021    ECHOCARDIOGRAM REPORT   Patient Name:   VEGAS FRITZE Date of Exam: 01/18/2021 Medical Rec #:  [id number]        Height:       72.0 in Accession #:    [id number]       Weight:       216.2 lb Date of Birth:  July 17, 1950        BSA:          2.202 m Patient Age:    43 years         BP:           127/83 mmHg Patient Gender: M                HR:           73 bpm. Exam Location:  Inpatient Procedure: 2D Echo, Cardiac Doppler and Color Doppler Indications:    Ventricular Tachycardia I47.2  History:        Patient has prior history of Echocardiogram examinations, most                 recent 03/01/2020. CAD and Previous Myocardial Infarction,                 Arrythmias:Atrial Flutter, Signs/Symptoms:Shortness of Breath;                 Risk Factors:Former Smoker, Hypertension, Diabetes and                 Dyslipidemia.  Sonographer:    Vickie Epley RDCS Referring Phys: [id number] [place]  1. Left ventricular ejection fraction, by estimation, is 40 to 45%. The left ventricle has mildly decreased function. The left ventricle demonstrates regional wall motion abnormalities (see scoring diagram/findings for description). Left ventricular diastolic parameters are consistent with Grade I diastolic dysfunction (impaired relaxation). There is mild dyskinesis of the left ventricular, basal  inferior wall and inferolateral wall.  2. Right ventricular systolic function is normal. The right ventricular size is normal. Tricuspid regurgitation signal is inadequate for assessing PA pressure.  3. Left atrial size was moderately dilated.  4. Right atrial size was mildly dilated.  5. The mitral valve is normal in structure. Mild mitral valve regurgitation.  6. The aortic valve is tricuspid. Aortic valve regurgitation is not visualized.  7. Aortic dilatation noted. There is mild dilatation of the aortic root, measuring 40 mm. There is mild dilatation of the ascending aorta, measuring 41 mm. FINDINGS  Left Ventricle: Left ventricular ejection fraction, by estimation, is 40 to 45%. The left ventricle has mildly decreased function. The left ventricle demonstrates regional wall motion abnormalities. Mild dyskinesis of the left ventricular, basal inferior wall and inferolateral wall. The left ventricular internal cavity size was normal in size. There is no left ventricular hypertrophy. Left ventricular diastolic parameters are consistent with Grade I diastolic dysfunction (impaired relaxation). Normal left ventricular filling pressure.  LV Wall Scoring: The basal inferolateral segment and basal inferior segment are dyskinetic. The mid inferolateral segment and basal inferoseptal segment are akinetic. Right Ventricle: The right ventricular size is normal. No increase in right ventricular wall thickness. Right ventricular systolic function is normal. Tricuspid regurgitation signal is inadequate for assessing PA pressure. Left Atrium: Left atrial size was moderately dilated. Right Atrium: Right atrial size  was mildly dilated. Pericardium: There is no evidence of pericardial effusion. Mitral Valve: The mitral valve is normal in structure. Mild mitral valve regurgitation, with centrally-directed jet. Tricuspid Valve: The tricuspid valve is normal in structure. Tricuspid valve regurgitation is not demonstrated. Aortic  Valve: The aortic valve is tricuspid. Aortic valve regurgitation is not visualized. Pulmonic Valve: The pulmonic valve was normal in structure. Pulmonic valve regurgitation is not visualized. Aorta: Aortic dilatation noted. There is mild dilatation of the aortic root, measuring 40 mm. There is mild dilatation of the ascending aorta, measuring 41 mm. IAS/Shunts: No atrial level shunt detected by color flow Doppler. Additional Comments: A device lead is visualized in the right ventricle and right atrium.  LEFT VENTRICLE PLAX 2D LVIDd:         5.90 cm      Diastology LVIDs:         4.90 cm      LV e' medial:    5.19 cm/s LV PW:         0.90 cm      LV E/e' medial:  9.1 LV IVS:        0.90 cm      LV e' lateral:   6.65 cm/s LVOT diam:     2.30 cm      LV E/e' lateral: 7.1 LV SV:         73 LV SV Index:   33 LVOT Area:     4.15 cm  LV Volumes (MOD) LV vol d, MOD A2C: 171.0 ml LV vol d, MOD A4C: 157.0 ml LV vol s, MOD A2C: 91.7 ml LV vol s, MOD A4C: 100.0 ml LV SV MOD A2C:     79.3 ml LV SV MOD A4C:     157.0 ml LV SV MOD BP:      69.5 ml RIGHT VENTRICLE RV S prime:     10.20 cm/s TAPSE (M-mode): 1.9 cm LEFT ATRIUM             Index       RIGHT ATRIUM           Index LA diam:        4.40 cm 2.00 cm/m  RA Area:     17.60 cm LA Vol (A2C):   71.8 ml 32.60 ml/m RA Volume:   48.80 ml  22.16 ml/m LA Vol (A4C):   58.5 ml 26.56 ml/m LA Biplane Vol: 67.7 ml 30.74 ml/m  AORTIC VALVE LVOT Vmax:   84.60 cm/s LVOT Vmean:  59.700 cm/s LVOT VTI:    0.176 m  AORTA Ao Root diam: 4.00 cm Ao Asc diam:  4.10 cm MITRAL VALVE MV Area (PHT): 4.10 cm    SHUNTS MV Decel Time: 185 msec    Systemic VTI:  0.18 m MV E velocity: 47.10 cm/s  Systemic Diam: 2.30 cm MV A velocity: 75.40 cm/s MV E/A ratio:  0.62 Mihai Croitoru MD Electronically signed by Sanda Klein MD Signature Date/Time: 01/18/2021/4:22:48 PM    Final    CUP PACEART INCLINIC DEVICE CHECK  Result Date: 02/01/2021 ICD check in clinic. Normal device function. Thresholds and  sensing consistent with previous device measurements. Impedance trends stable over time. No mode switches. No new ventricular arrhythmias. Histogram distribution appropriate for patient and level of activity. No changes made this session. Device programmed at appropriate safety margins. Device programmed to optimize intrinsic conduction. Estimated longevity 9 yr, 6mo. Pt enrolled in remote follow-up. Patient education completed    Procedures:  left foot second toe amputation   Subjective: Patient is feeling better, no nausea or vomiting, no chest pain or dyspnea.  Yesterday post op had bleeding at the amputation site and gauze was changed, this am wound site looks clean.   Discharge Exam: Vitals:   02/11/21 0731 02/11/21 0756  BP:  (!) 141/91  Pulse:  69  Resp:  17  Temp:  98.2 F (36.8 C)  SpO2: 99% 98%   Vitals:   02/10/21 1829 02/10/21 2021 02/11/21 0731 02/11/21 0756  BP: 124/90 120/83  (!) 141/91  Pulse: 70 69  69  Resp: 20   17  Temp:  97.9 F (36.6 C)  98.2 F (36.8 C)  TempSrc:  Oral    SpO2: 98% 99% 99% 98%  Weight:      Height:        General: Not in pain or dyspnea  Neurology: Awake and alert, non focal  E ENT: no pallor, no icterus, oral mucosa moist Cardiovascular: No JVD. S1-S2 present, rhythmic, no gallops, rubs, or murmurs. No lower extremity edema. Pulmonary: positive breath sounds bilaterally, with no wheezing, rhonchi or rales. Gastrointestinal. Abdomen soft and non tender Skin. Dressing in place on left foot.  Musculoskeletal: left foot with dressing in place, no signs of bleeding, sp amputation of second toe.   The results of significant diagnostics from this hospitalization (including imaging, microbiology, ancillary and laboratory) are listed below for reference.     Microbiology: Recent Results (from the past 240 hour(s))  WOUND CULTURE     Status: Abnormal (Preliminary result)   Collection Time: 02/04/21 10:24 AM  Result Value Ref Range Status    Gram Stain Result Final report  Final   Organism ID, Bacteria Comment  Final    Comment: No white blood cells seen.   Organism ID, Bacteria No organisms seen  Final   Aerobic Bacterial Culture Preliminary report (A)  Preliminary   Organism ID, Bacteria Comment (A)  Preliminary    Comment: Beta hemolytic Streptococcus, group B Heavy growth Penicillin and ampicillin are drugs of choice for treatment of beta-hemolytic streptococcal infections. Susceptibility testing of penicillins and other beta-lactam agents approved by the FDA for treatment of beta-hemolytic streptococcal infections need not be performed routinely because nonsusceptible isolates are extremely rare in any beta-hemolytic streptococcus and have not been reported for Streptococcus pyogenes (group A). (CLSI)    Organism ID, Bacteria Mixed skin flora  Preliminary    Comment: Scant growth  Resp Panel by RT-PCR (Flu A&B, Covid) Nasopharyngeal Swab     Status: None   Collection Time: 02/08/21  8:02 PM   Specimen: Nasopharyngeal Swab; Nasopharyngeal(NP) swabs in vial transport medium  Result Value Ref Range Status   SARS Coronavirus 2 by RT PCR NEGATIVE NEGATIVE Final    Comment: (NOTE) SARS-CoV-2 target nucleic acids are NOT DETECTED.  The SARS-CoV-2 RNA is generally detectable in upper respiratory specimens during the acute phase of infection. The lowest concentration of SARS-CoV-2 viral copies this assay can detect is 138 copies/mL. A negative result does not preclude SARS-Cov-2 infection and should not be used as the sole basis for treatment or other patient management decisions. A negative result may occur with  improper specimen collection/handling, submission of specimen other than nasopharyngeal swab, presence of viral mutation(s) within the areas targeted by this assay, and inadequate number of viral copies(<138 copies/mL). A negative result must be combined with clinical observations, patient history, and  epidemiological information. The expected result is Negative.  Fact  Sheet for Patients:  EntrepreneurPulse.com.au  Fact Sheet for Healthcare Providers:  IncredibleEmployment.be  This test is no t yet approved or cleared by the [place] FDA and  has been authorized for detection and/or diagnosis of SARS-CoV-2 by FDA under an Emergency Use Authorization (EUA). This EUA will remain  in effect (meaning this test can be used) for the duration of the COVID-19 declaration under Section 564(b)(1) of the Act, 21 U.S.C.section 360bbb-3(b)(1), unless the authorization is terminated  or revoked sooner.       Influenza A by PCR NEGATIVE NEGATIVE Final   Influenza B by PCR NEGATIVE NEGATIVE Final    Comment: (NOTE) The Xpert Xpress SARS-CoV-2/FLU/RSV plus assay is intended as an aid in the diagnosis of influenza from Nasopharyngeal swab specimens and should not be used as a sole basis for treatment. Nasal washings and aspirates are unacceptable for Xpert Xpress SARS-CoV-2/FLU/RSV testing.  Fact Sheet for Patients: EntrepreneurPulse.com.au  Fact Sheet for Healthcare Providers: IncredibleEmployment.be  This test is not yet approved or cleared by the [place] FDA and has been authorized for detection and/or diagnosis of SARS-CoV-2 by FDA under an Emergency Use Authorization (EUA). This EUA will remain in effect (meaning this test can be used) for the duration of the COVID-19 declaration under Section 564(b)(1) of the Act, 21 U.S.C. section 360bbb-3(b)(1), unless the authorization is terminated or revoked.  Performed at [facility], [place] [address], [place], [place] [address]   Blood Cultures x 2 sites     Status: None (Preliminary result)   Collection Time: 02/08/21  8:10 PM   Specimen: BLOOD RIGHT HAND  Result Value Ref Range Status   Specimen Description BLOOD RIGHT HAND  Final   Special Requests    Final    BOTTLES DRAWN AEROBIC AND ANAEROBIC Blood Culture results may not be optimal due to an inadequate volume of blood received in culture bottles   Culture   Final    NO GROWTH 3 DAYS Performed at [facility], [place] [address] [place],  [address]    Report Status PENDING  Incomplete  Blood Cultures x 2 sites     Status: None (Preliminary result)   Collection Time: 02/08/21  8:19 PM   Specimen: BLOOD LEFT HAND  Result Value Ref Range Status   Specimen Description BLOOD LEFT HAND  Final   Special Requests   Final    BOTTLES DRAWN AEROBIC AND ANAEROBIC Blood Culture results may not be optimal due to an inadequate volume of blood received in culture bottles   Culture   Final    NO GROWTH 3 DAYS Performed at [facility], [place] [address] [place],  [address]    Report Status PENDING  Incomplete  Surgical pcr screen     Status: None   Collection Time: 02/10/21  5:00 PM  Result Value Ref Range Status   MRSA, PCR NEGATIVE NEGATIVE Final   Staphylococcus aureus NEGATIVE NEGATIVE Final    Comment: (NOTE) The Xpert SA Assay (FDA approved for NASAL specimens in patients 31 years of age and older), is one component of a comprehensive surveillance program. It is not intended to diagnose infection nor to guide or monitor treatment. Performed at [facility], [place] [address] [place],  [address]   Aerobic/Anaerobic Culture w Gram Stain (surgical/deep wound)     Status: None (Preliminary result)   Collection Time: 02/10/21  5:48 PM   Specimen: Soft Tissue, Other  Result Value Ref  Range Status   Specimen Description WOUND LEFT TOE  Final   Special Requests 2ND TOE  Final   Gram Stain   Final    NO SQUAMOUS EPITHELIAL CELLS SEEN NO WBC SEEN NO ORGANISMS SEEN Performed at [facility], [address], [place], [place] [address]    Culture PENDING  Incomplete   Report Status PENDING  Incomplete     Labs: BNP (last 3 results) Recent Labs     01/17/21 2233  BNP 630.1*   Basic Metabolic Panel: Recent Labs  Lab 02/08/21 1303 02/09/21 0350 02/11/21 0243  NA 141 142 141  K 4.0 3.5 4.0  CL 108 109 107  CO2 26 27 26   GLUCOSE 218* 139* 131*  BUN 13 11 14   CREATININE 1.06 1.03 0.99  CALCIUM 9.2 8.6* 8.8*  MG  --   --  2.2   Liver Function Tests: Recent Labs  Lab 02/08/21 1303  AST 20  ALT 20  ALKPHOS 87  BILITOT 1.0  PROT 6.1*  ALBUMIN 3.3*   No results for input(s): LIPASE, AMYLASE in the last 168 hours. No results for input(s): AMMONIA in the last 168 hours. CBC: Recent Labs  Lab 02/08/21 1303 02/09/21 0350 02/11/21 0549  WBC 10.5 6.8 6.5  NEUTROABS 8.4*  --  4.4  HGB 17.3* 14.5 15.7  HCT 53.5* 45.5 49.7  MCV 94.7 94.0 94.3  PLT 194 159 169   Cardiac Enzymes: No results for input(s): CKTOTAL, CKMB, CKMBINDEX, TROPONINI in the last 168 hours. BNP: Invalid input(s): POCBNP CBG: Recent Labs  Lab 02/10/21 1530 02/10/21 1806 02/10/21 2032 02/10/21 2123 02/11/21 0746  GLUCAP 92 89 206* 208* 144*   D-Dimer No results for input(s): DDIMER in the last 72 hours. Hgb A1c Recent Labs    02/08/21 1939  HGBA1C 8.0*   Lipid Profile No results for input(s): CHOL, HDL, LDLCALC, TRIG, CHOLHDL, LDLDIRECT in the last 72 hours. Thyroid function studies No results for input(s): TSH, T4TOTAL, T3FREE, THYROIDAB in the last 72 hours.  Invalid input(s): FREET3 Anemia work up No results for input(s): VITAMINB12, FOLATE, FERRITIN, TIBC, IRON, RETICCTPCT in the last 72 hours. Urinalysis    Component Value Date/Time   COLORURINE YELLOW 01/18/2021 0950   APPEARANCEUR CLEAR 01/18/2021 0950   LABSPEC 1.024 01/18/2021 0950   PHURINE 5.0 01/18/2021 0950   GLUCOSEU >=500 (A) 01/18/2021 0950   HGBUR NEGATIVE 01/18/2021 0950   BILIRUBINUR NEGATIVE 01/18/2021 0950   KETONESUR 5 (A) 01/18/2021 0950   PROTEINUR NEGATIVE 01/18/2021 0950   UROBILINOGEN 1.0 02/27/2012 0001   NITRITE NEGATIVE 01/18/2021 0950    LEUKOCYTESUR NEGATIVE 01/18/2021 0950   Sepsis Labs Invalid input(s): PROCALCITONIN,  WBC,  LACTICIDVEN Microbiology Recent Results (from the past 240 hour(s))  WOUND CULTURE     Status: Abnormal (Preliminary result)   Collection Time: 02/04/21 10:24 AM  Result Value Ref Range Status   Gram Stain Result Final report  Final   Organism ID, Bacteria Comment  Final    Comment: No white blood cells seen.   Organism ID, Bacteria No organisms seen  Final   Aerobic Bacterial Culture Preliminary report (A)  Preliminary   Organism ID, Bacteria Comment (A)  Preliminary    Comment: Beta hemolytic Streptococcus, group B Heavy growth Penicillin and ampicillin are drugs of choice for treatment of beta-hemolytic streptococcal infections. Susceptibility testing of penicillins and other beta-lactam agents approved by the FDA for treatment of beta-hemolytic streptococcal infections need not be performed routinely because nonsusceptible  isolates are extremely rare in any beta-hemolytic streptococcus and have not been reported for Streptococcus pyogenes (group A). (CLSI)    Organism ID, Bacteria Mixed skin flora  Preliminary    Comment: Scant growth  Resp Panel by RT-PCR (Flu A&B, Covid) Nasopharyngeal Swab     Status: None   Collection Time: 02/08/21  8:02 PM   Specimen: Nasopharyngeal Swab; Nasopharyngeal(NP) swabs in vial transport medium  Result Value Ref Range Status   SARS Coronavirus 2 by RT PCR NEGATIVE NEGATIVE Final    Comment: (NOTE) SARS-CoV-2 target nucleic acids are NOT DETECTED.  The SARS-CoV-2 RNA is generally detectable in upper respiratory specimens during the acute phase of infection. The lowest concentration of SARS-CoV-2 viral copies this assay can detect is 138 copies/mL. A negative result does not preclude SARS-Cov-2 infection and should not be used as the sole basis for treatment or other patient management decisions. A negative result may occur with  improper specimen  collection/handling, submission of specimen other than nasopharyngeal swab, presence of viral mutation(s) within the areas targeted by this assay, and inadequate number of viral copies(<138 copies/mL). A negative result must be combined with clinical observations, patient history, and epidemiological information. The expected result is Negative.  Fact Sheet for Patients:  EntrepreneurPulse.com.au  Fact Sheet for Healthcare Providers:  IncredibleEmployment.be  This test is no t yet approved or cleared by the [place] FDA and  has been authorized for detection and/or diagnosis of SARS-CoV-2 by FDA under an Emergency Use Authorization (EUA). This EUA will remain  in effect (meaning this test can be used) for the duration of the COVID-19 declaration under Section 564(b)(1) of the Act, 21 U.S.C.section 360bbb-3(b)(1), unless the authorization is terminated  or revoked sooner.       Influenza A by PCR NEGATIVE NEGATIVE Final   Influenza B by PCR NEGATIVE NEGATIVE Final    Comment: (NOTE) The Xpert Xpress SARS-CoV-2/FLU/RSV plus assay is intended as an aid in the diagnosis of influenza from Nasopharyngeal swab specimens and should not be used as a sole basis for treatment. Nasal washings and aspirates are unacceptable for Xpert Xpress SARS-CoV-2/FLU/RSV testing.  Fact Sheet for Patients: EntrepreneurPulse.com.au  Fact Sheet for Healthcare Providers: IncredibleEmployment.be  This test is not yet approved or cleared by the [place] FDA and has been authorized for detection and/or diagnosis of SARS-CoV-2 by FDA under an Emergency Use Authorization (EUA). This EUA will remain in effect (meaning this test can be used) for the duration of the COVID-19 declaration under Section 564(b)(1) of the Act, 21 U.S.C. section 360bbb-3(b)(1), unless the authorization is terminated or revoked.  Performed at [facility], [place] [address] [place], [place] [address]   Blood Cultures x 2 sites     Status: None (Preliminary result)   Collection Time: 02/08/21  8:10 PM   Specimen: BLOOD RIGHT HAND  Result Value Ref Range Status   Specimen Description BLOOD RIGHT HAND  Final   Special Requests   Final    BOTTLES DRAWN AEROBIC AND ANAEROBIC Blood Culture results may not be optimal due to an inadequate volume of blood received in culture bottles   Culture   Final    NO GROWTH 3 DAYS Performed at [facility], [place] [address], [place], [place] [address]    Report Status PENDING  Incomplete  Blood Cultures x 2 sites     Status: None (Preliminary result)   Collection Time: 02/08/21  8:19 PM   Specimen: BLOOD LEFT  HAND  Result Value Ref Range Status   Specimen Description BLOOD LEFT HAND  Final   Special Requests   Final    BOTTLES DRAWN AEROBIC AND ANAEROBIC Blood Culture results may not be optimal due to an inadequate volume of blood received in culture bottles   Culture   Final    NO GROWTH 3 DAYS Performed at [facility], [place] [address], [place], [place] [address]    Report Status PENDING  Incomplete  Surgical pcr screen     Status: None   Collection Time: 02/10/21  5:00 PM  Result Value Ref Range Status   MRSA, PCR NEGATIVE NEGATIVE Final   Staphylococcus aureus NEGATIVE NEGATIVE Final    Comment: (NOTE) The Xpert SA Assay (FDA approved for NASAL specimens in patients 81 years of age and older), is one component of a comprehensive surveillance program. It is not intended to diagnose infection nor to guide or monitor treatment. Performed at [facility], [place] [address] [place], [place] [address]   Aerobic/Anaerobic Culture w Gram Stain (surgical/deep wound)     Status: None (Preliminary result)   Collection Time: 02/10/21  5:48 PM   Specimen: Soft Tissue, Other  Result Value Ref Range Status   Specimen Description WOUND LEFT TOE  Final   Special Requests 2ND TOE   Final   Gram Stain   Final    NO SQUAMOUS EPITHELIAL CELLS SEEN NO WBC SEEN NO ORGANISMS SEEN Performed at [facility], [address], [place],  [address]    Culture PENDING  Incomplete   Report Status PENDING  Incomplete     Time coordinating discharge: 45 minutes  SIGNED:   Tawni Millers, MD  [facility] 02/11/2021, 11:24 AM

## 2021-02-11 NOTE — TOC Transition Note (Addendum)
Transition of Care [facility]) - CM/SW Discharge Note   Patient Details  Name: John [name] MRN: [id number] Date of Birth: 04-Jul-1950  [facility] CM/SW Contact:  Sharin Mons, RN Phone Number: 02/11/2021, 12:20 PM   Clinical Narrative:   Admitted with osteomyelitis left second toe.      - S/p  Amputation left second toe, 8/25 From home with wife. States PTA independent with ADL's, no DME usage.  Per MD patient ready for DC today . RN, patient, and patient's family aware of DC. Pt without DME needs.  Pt without Rx MED concerns or affordability issues.  Post hospital f/u noted on AVS.  Pt with transportation to home.  RNCM will sign off for now as intervention is no longer needed. Please consult [place] again if new needs arise.    Final next level of care: Home/Self Care (Resides with wife) Barriers to Discharge: No Barriers Identified   Patient Goals and CMS Choice       Discharge Placement       Discharge Plan and Services    Social Determinants of Health (SDOH) Interventions     Readmission Risk Interventions No flowsheet data found.

## 2021-02-11 NOTE — Care Management Important Message (Signed)
Important Message  Patient Details  Name: John [name] MRN: [id number] Date of Birth: 08-Oct-1950   Medicare Important Message Given:  Yes     Orbie Pyo 02/11/2021, 4:35 PM

## 2021-02-11 NOTE — Anesthesia Postprocedure Evaluation (Signed)
Anesthesia Post Note  Patient: John [name]  Procedure(s) Performed: AMPUTATION  LEFT SECOND TOE (Left: Toe)     Patient location during evaluation: PACU Anesthesia Type: MAC Level of consciousness: awake and alert Pain management: pain level controlled Vital Signs Assessment: post-procedure vital signs reviewed and stable Respiratory status: spontaneous breathing, nonlabored ventilation, respiratory function stable and patient connected to nasal cannula oxygen Cardiovascular status: stable and blood pressure returned to baseline Postop Assessment: no apparent nausea or vomiting Anesthetic complications: no   No notable events documented.  Last Vitals:  Vitals:   02/11/21 0731 02/11/21 0756  BP:  (!) 141/91  Pulse:  69  Resp:  17  Temp:  36.8 C  SpO2: 99% 98%    Last Pain:  Vitals:   02/11/21 0000  TempSrc:   PainSc: 0-No pain                 Tiajuana Amass

## 2021-02-11 NOTE — Discharge Instructions (Signed)
Information on my medicine - ELIQUIS (apixaban)  This medication education was reviewed with me or my healthcare representative as part of my discharge preparation.  You were taking this medication prior to this hospital admission.   Why was Eliquis prescribed for you? Eliquis was prescribed for you to reduce the risk of a blood clot forming that can cause a stroke if you have a medical condition called atrial fibrillation (a type of irregular heartbeat).  What do You need to know about Eliquis ? Take your Eliquis TWICE DAILY - one tablet in the morning and one tablet in the evening with or without food. If you have difficulty swallowing the tablet whole please discuss with your pharmacist how to take the medication safely.  Take Eliquis exactly as prescribed by your doctor and DO NOT stop taking Eliquis without talking to the doctor who prescribed the medication.  Stopping may increase your risk of developing a stroke.  Refill your prescription before you run out.  After discharge, you should have regular check-up appointments with your healthcare provider that is prescribing your Eliquis.  In the future your dose may need to be changed if your kidney function or weight changes by a significant amount or as you get older.  What do you do if you miss a dose? If you miss a dose, take it as soon as you remember on the same day and resume taking twice daily.  Do not take more than one dose of ELIQUIS at the same time to make up a missed dose.  Important Safety Information A possible side effect of Eliquis is bleeding. You should call your healthcare provider right away if you experience any of the following: Bleeding from an injury or your nose that does not stop. Unusual colored urine (red or dark brown) or unusual colored stools (red or black). Unusual bruising for unknown reasons. A serious fall or if you hit your head (even if there is no bleeding).  Some medicines may interact with  Eliquis and might increase your risk of bleeding or clotting while on Eliquis. To help avoid this, consult your healthcare provider or pharmacist prior to using any new prescription or non-prescription medications, including herbals, vitamins, non-steroidal anti-inflammatory drugs (NSAIDs) and supplements.  This website has more information on Eliquis (apixaban): http://www.eliquis.com/eliquis/home

## 2021-02-13 LAB — CULTURE, BLOOD (ROUTINE X 2)
Culture: NO GROWTH
Culture: NO GROWTH

## 2021-02-13 LAB — WOUND CULTURE: Organism ID, Bacteria: NONE SEEN

## 2021-02-14 ENCOUNTER — Encounter (HOSPITAL_COMMUNITY): Payer: Self-pay

## 2021-02-14 ENCOUNTER — Inpatient Hospital Stay (HOSPITAL_COMMUNITY)
Admission: EM | Admit: 2021-02-14 | Discharge: 2021-02-18 | DRG: 246 | Disposition: A | Discharge status: Rehabilitation Facility | Payer: Medicare Other | Attending: Cardiology | Admitting: Cardiology

## 2021-02-14 ENCOUNTER — Other Ambulatory Visit: Payer: Self-pay

## 2021-02-14 ENCOUNTER — Emergency Department (HOSPITAL_COMMUNITY): Payer: Medicare Other

## 2021-02-14 DIAGNOSIS — Z8249 Family history of ischemic heart disease and other diseases of the circulatory system: Secondary | ICD-10-CM

## 2021-02-14 DIAGNOSIS — I2510 Atherosclerotic heart disease of native coronary artery without angina pectoris: Secondary | ICD-10-CM | POA: Diagnosis present

## 2021-02-14 DIAGNOSIS — Z7984 Long term (current) use of oral hypoglycemic drugs: Secondary | ICD-10-CM

## 2021-02-14 DIAGNOSIS — I5022 Chronic systolic (congestive) heart failure: Secondary | ICD-10-CM

## 2021-02-14 DIAGNOSIS — J449 Chronic obstructive pulmonary disease, unspecified: Secondary | ICD-10-CM | POA: Diagnosis present

## 2021-02-14 DIAGNOSIS — I214 Non-ST elevation (NSTEMI) myocardial infarction: Secondary | ICD-10-CM | POA: Diagnosis not present

## 2021-02-14 DIAGNOSIS — Z89422 Acquired absence of other left toe(s): Secondary | ICD-10-CM | POA: Diagnosis not present

## 2021-02-14 DIAGNOSIS — Z7901 Long term (current) use of anticoagulants: Secondary | ICD-10-CM

## 2021-02-14 DIAGNOSIS — Z8679 Personal history of other diseases of the circulatory system: Secondary | ICD-10-CM

## 2021-02-14 DIAGNOSIS — M869 Osteomyelitis, unspecified: Secondary | ICD-10-CM | POA: Diagnosis not present

## 2021-02-14 DIAGNOSIS — E66811 Obesity, class 1: Secondary | ICD-10-CM | POA: Diagnosis present

## 2021-02-14 DIAGNOSIS — I252 Old myocardial infarction: Secondary | ICD-10-CM | POA: Diagnosis not present

## 2021-02-14 DIAGNOSIS — G8194 Hemiplegia, unspecified affecting left nondominant side: Secondary | ICD-10-CM | POA: Diagnosis not present

## 2021-02-14 DIAGNOSIS — H532 Diplopia: Secondary | ICD-10-CM | POA: Diagnosis not present

## 2021-02-14 DIAGNOSIS — E1143 Type 2 diabetes mellitus with diabetic autonomic (poly)neuropathy: Secondary | ICD-10-CM

## 2021-02-14 DIAGNOSIS — I25110 Atherosclerotic heart disease of native coronary artery with unstable angina pectoris: Secondary | ICD-10-CM | POA: Diagnosis not present

## 2021-02-14 DIAGNOSIS — G4733 Obstructive sleep apnea (adult) (pediatric): Secondary | ICD-10-CM

## 2021-02-14 DIAGNOSIS — I4892 Unspecified atrial flutter: Secondary | ICD-10-CM | POA: Diagnosis not present

## 2021-02-14 DIAGNOSIS — Z955 Presence of coronary angioplasty implant and graft: Secondary | ICD-10-CM

## 2021-02-14 DIAGNOSIS — Z20822 Contact with and (suspected) exposure to covid-19: Secondary | ICD-10-CM | POA: Diagnosis not present

## 2021-02-14 DIAGNOSIS — Z91013 Allergy to seafood: Secondary | ICD-10-CM

## 2021-02-14 DIAGNOSIS — Z6828 Body mass index (BMI) 28.0-28.9, adult: Secondary | ICD-10-CM | POA: Diagnosis not present

## 2021-02-14 DIAGNOSIS — R471 Dysarthria and anarthria: Secondary | ICD-10-CM | POA: Diagnosis not present

## 2021-02-14 DIAGNOSIS — I472 Ventricular tachycardia: Secondary | ICD-10-CM | POA: Diagnosis present

## 2021-02-14 DIAGNOSIS — E785 Hyperlipidemia, unspecified: Secondary | ICD-10-CM | POA: Diagnosis present

## 2021-02-14 DIAGNOSIS — E119 Type 2 diabetes mellitus without complications: Secondary | ICD-10-CM | POA: Diagnosis not present

## 2021-02-14 DIAGNOSIS — I639 Cerebral infarction, unspecified: Secondary | ICD-10-CM | POA: Diagnosis not present

## 2021-02-14 DIAGNOSIS — E669 Obesity, unspecified: Secondary | ICD-10-CM | POA: Diagnosis present

## 2021-02-14 DIAGNOSIS — Z95810 Presence of automatic (implantable) cardiac defibrillator: Secondary | ICD-10-CM | POA: Diagnosis not present

## 2021-02-14 DIAGNOSIS — E782 Mixed hyperlipidemia: Secondary | ICD-10-CM | POA: Diagnosis present

## 2021-02-14 DIAGNOSIS — R29818 Other symptoms and signs involving the nervous system: Secondary | ICD-10-CM

## 2021-02-14 DIAGNOSIS — R0689 Other abnormalities of breathing: Secondary | ICD-10-CM | POA: Diagnosis not present

## 2021-02-14 DIAGNOSIS — I959 Hypotension, unspecified: Secondary | ICD-10-CM | POA: Diagnosis not present

## 2021-02-14 DIAGNOSIS — H5347 Heteronymous bilateral field defects: Secondary | ICD-10-CM | POA: Diagnosis not present

## 2021-02-14 DIAGNOSIS — I6340 Cerebral infarction due to embolism of unspecified cerebral artery: Secondary | ICD-10-CM | POA: Diagnosis not present

## 2021-02-14 DIAGNOSIS — R29715 NIHSS score 15: Secondary | ICD-10-CM | POA: Diagnosis not present

## 2021-02-14 DIAGNOSIS — E1165 Type 2 diabetes mellitus with hyperglycemia: Secondary | ICD-10-CM | POA: Diagnosis not present

## 2021-02-14 DIAGNOSIS — Y840 Cardiac catheterization as the cause of abnormal reaction of the patient, or of later complication, without mention of misadventure at the time of the procedure: Secondary | ICD-10-CM | POA: Diagnosis not present

## 2021-02-14 DIAGNOSIS — R911 Solitary pulmonary nodule: Secondary | ICD-10-CM | POA: Diagnosis not present

## 2021-02-14 DIAGNOSIS — I429 Cardiomyopathy, unspecified: Secondary | ICD-10-CM | POA: Diagnosis not present

## 2021-02-14 DIAGNOSIS — I5042 Chronic combined systolic (congestive) and diastolic (congestive) heart failure: Secondary | ICD-10-CM | POA: Diagnosis not present

## 2021-02-14 DIAGNOSIS — I6389 Other cerebral infarction: Secondary | ICD-10-CM | POA: Diagnosis not present

## 2021-02-14 DIAGNOSIS — R918 Other nonspecific abnormal finding of lung field: Secondary | ICD-10-CM | POA: Diagnosis not present

## 2021-02-14 DIAGNOSIS — E663 Overweight: Secondary | ICD-10-CM | POA: Diagnosis not present

## 2021-02-14 DIAGNOSIS — I10 Essential (primary) hypertension: Secondary | ICD-10-CM | POA: Diagnosis present

## 2021-02-14 DIAGNOSIS — R414 Neurologic neglect syndrome: Secondary | ICD-10-CM

## 2021-02-14 DIAGNOSIS — I6523 Occlusion and stenosis of bilateral carotid arteries: Secondary | ICD-10-CM | POA: Diagnosis not present

## 2021-02-14 DIAGNOSIS — Z7951 Long term (current) use of inhaled steroids: Secondary | ICD-10-CM

## 2021-02-14 DIAGNOSIS — Z9989 Dependence on other enabling machines and devices: Secondary | ICD-10-CM

## 2021-02-14 DIAGNOSIS — R29810 Facial weakness: Secondary | ICD-10-CM | POA: Diagnosis not present

## 2021-02-14 DIAGNOSIS — M21372 Foot drop, left foot: Secondary | ICD-10-CM | POA: Diagnosis present

## 2021-02-14 DIAGNOSIS — B951 Streptococcus, group B, as the cause of diseases classified elsewhere: Secondary | ICD-10-CM | POA: Diagnosis not present

## 2021-02-14 DIAGNOSIS — K219 Gastro-esophageal reflux disease without esophagitis: Secondary | ICD-10-CM | POA: Diagnosis present

## 2021-02-14 DIAGNOSIS — Z87891 Personal history of nicotine dependence: Secondary | ICD-10-CM

## 2021-02-14 DIAGNOSIS — I110 Hypertensive heart disease with heart failure: Secondary | ICD-10-CM | POA: Diagnosis present

## 2021-02-14 DIAGNOSIS — I517 Cardiomegaly: Secondary | ICD-10-CM | POA: Diagnosis not present

## 2021-02-14 DIAGNOSIS — R0789 Other chest pain: Secondary | ICD-10-CM | POA: Diagnosis not present

## 2021-02-14 DIAGNOSIS — I249 Acute ischemic heart disease, unspecified: Secondary | ICD-10-CM | POA: Diagnosis not present

## 2021-02-14 DIAGNOSIS — I63439 Cerebral infarction due to embolism of unspecified posterior cerebral artery: Secondary | ICD-10-CM | POA: Diagnosis not present

## 2021-02-14 DIAGNOSIS — Z794 Long term (current) use of insulin: Secondary | ICD-10-CM

## 2021-02-14 DIAGNOSIS — I97810 Intraoperative cerebrovascular infarction during cardiac surgery: Secondary | ICD-10-CM | POA: Diagnosis not present

## 2021-02-14 DIAGNOSIS — E118 Type 2 diabetes mellitus with unspecified complications: Secondary | ICD-10-CM | POA: Diagnosis not present

## 2021-02-14 DIAGNOSIS — I480 Paroxysmal atrial fibrillation: Secondary | ICD-10-CM | POA: Diagnosis present

## 2021-02-14 DIAGNOSIS — I69392 Facial weakness following cerebral infarction: Secondary | ICD-10-CM | POA: Diagnosis not present

## 2021-02-14 DIAGNOSIS — E1169 Type 2 diabetes mellitus with other specified complication: Secondary | ICD-10-CM | POA: Diagnosis present

## 2021-02-14 DIAGNOSIS — R079 Chest pain, unspecified: Secondary | ICD-10-CM | POA: Diagnosis not present

## 2021-02-14 DIAGNOSIS — I69398 Other sequelae of cerebral infarction: Secondary | ICD-10-CM | POA: Diagnosis not present

## 2021-02-14 DIAGNOSIS — I63139 Cerebral infarction due to embolism of unspecified carotid artery: Secondary | ICD-10-CM | POA: Diagnosis not present

## 2021-02-14 DIAGNOSIS — I213 ST elevation (STEMI) myocardial infarction of unspecified site: Secondary | ICD-10-CM | POA: Diagnosis not present

## 2021-02-14 DIAGNOSIS — Z79899 Other long term (current) drug therapy: Secondary | ICD-10-CM

## 2021-02-14 DIAGNOSIS — Z888 Allergy status to other drugs, medicaments and biological substances status: Secondary | ICD-10-CM

## 2021-02-14 LAB — PROTIME-INR
INR: 1.2 (ref 0.8–1.2)
Prothrombin Time: 15.1 seconds (ref 11.4–15.2)

## 2021-02-14 LAB — CBC WITH DIFFERENTIAL/PLATELET
Abs Immature Granulocytes: 0.07 10*3/uL (ref 0.00–0.07)
Basophils Absolute: 0 10*3/uL (ref 0.0–0.1)
Basophils Relative: 0 %
Eosinophils Absolute: 0.1 10*3/uL (ref 0.0–0.5)
Eosinophils Relative: 1 %
HCT: 48.1 % (ref 39.0–52.0)
Hemoglobin: 15.7 g/dL (ref 13.0–17.0)
Immature Granulocytes: 1 %
Lymphocytes Relative: 11 %
Lymphs Abs: 1 10*3/uL (ref 0.7–4.0)
MCH: 30.7 pg (ref 26.0–34.0)
MCHC: 32.6 g/dL (ref 30.0–36.0)
MCV: 93.9 fL (ref 80.0–100.0)
Monocytes Absolute: 0.8 10*3/uL (ref 0.1–1.0)
Monocytes Relative: 8 %
Neutro Abs: 7.7 10*3/uL (ref 1.7–7.7)
Neutrophils Relative %: 79 %
Platelets: 163 10*3/uL (ref 150–400)
RBC: 5.12 MIL/uL (ref 4.22–5.81)
RDW: 13.6 % (ref 11.5–15.5)
WBC: 9.8 10*3/uL (ref 4.0–10.5)
nRBC: 0 % (ref 0.0–0.2)

## 2021-02-14 LAB — RESP PANEL BY RT-PCR (FLU A&B, COVID) ARPGX2
Influenza A by PCR: NEGATIVE
Influenza B by PCR: NEGATIVE
SARS Coronavirus 2 by RT PCR: NEGATIVE

## 2021-02-14 LAB — CBG MONITORING, ED: Glucose-Capillary: 115 mg/dL — ABNORMAL HIGH (ref 70–99)

## 2021-02-14 LAB — TROPONIN I (HIGH SENSITIVITY)
Troponin I (High Sensitivity): 3076 ng/L (ref <18)
Troponin I (High Sensitivity): 2458 ng/L (ref <18)
Troponin I (High Sensitivity): 2923 ng/L (ref <18)

## 2021-02-14 LAB — BASIC METABOLIC PANEL
Anion gap: 7 (ref 5–15)
BUN: 14 mg/dL (ref 8–23)
CO2: 23 mmol/L (ref 22–32)
Calcium: 8.5 mg/dL — ABNORMAL LOW (ref 8.9–10.3)
Chloride: 108 mmol/L (ref 98–111)
Creatinine, Ser: 0.88 mg/dL (ref 0.61–1.24)
GFR, Estimated: >60 mL/min (ref >60)
Glucose, Bld: 136 mg/dL — ABNORMAL HIGH (ref 70–99)
Potassium: 4.2 mmol/L (ref 3.5–5.1)
Sodium: 138 mmol/L (ref 135–145)

## 2021-02-14 LAB — D-DIMER, QUANTITATIVE: D-Dimer, Quant: 0.66 ug/mL-FEU — ABNORMAL HIGH (ref 0.00–0.50)

## 2021-02-14 LAB — HEPARIN LEVEL (UNFRACTIONATED): Heparin Unfractionated: >1.1 IU/mL — ABNORMAL HIGH (ref 0.30–0.70)

## 2021-02-14 LAB — APTT: aPTT: 30 seconds (ref 24–36)

## 2021-02-14 MED ORDER — METOPROLOL SUCCINATE ER 100 MG PO TB24
100.0000 mg | ORAL_TABLET | Freq: Every day | ORAL | Status: DC
  Administered 2021-02-14 – 2021-02-18 (×5): 100 mg via ORAL
  Filled 2021-02-14: qty 2
  Filled 2021-02-14: qty 1
  Filled 2021-02-14: qty 4
  Filled 2021-02-14: qty 1
  Filled 2021-02-14: qty 4

## 2021-02-14 MED ORDER — ALBUTEROL SULFATE HFA 108 (90 BASE) MCG/ACT IN AERS
2.0000 | INHALATION_SPRAY | Freq: Four times a day (QID) | RESPIRATORY_TRACT | Status: DC | PRN
  Filled 2021-02-14 (×2): qty 6.7

## 2021-02-14 MED ORDER — MEXILETINE HCL 150 MG PO CAPS
300.0000 mg | ORAL_CAPSULE | Freq: Two times a day (BID) | ORAL | Status: DC
  Administered 2021-02-15: 300 mg via ORAL
  Administered 2021-02-15: 150 mg via ORAL
  Administered 2021-02-15: 300 mg via ORAL
  Administered 2021-02-16: 150 mg via ORAL
  Filled 2021-02-14 (×6): qty 2

## 2021-02-14 MED ORDER — SOTALOL HCL 80 MG PO TABS
160.0000 mg | ORAL_TABLET | Freq: Two times a day (BID) | ORAL | Status: DC
  Administered 2021-02-14 – 2021-02-18 (×8): 160 mg via ORAL
  Filled 2021-02-14 (×10): qty 2

## 2021-02-14 MED ORDER — ASPIRIN EC 81 MG PO TBEC
81.0000 mg | DELAYED_RELEASE_TABLET | Freq: Every day | ORAL | Status: DC
  Administered 2021-02-15 – 2021-02-18 (×4): 81 mg via ORAL
  Filled 2021-02-14 (×4): qty 1

## 2021-02-14 MED ORDER — FENTANYL CITRATE PF 50 MCG/ML IJ SOSY
50.0000 ug | PREFILLED_SYRINGE | Freq: Once | INTRAMUSCULAR | Status: AC
  Administered 2021-02-14: 50 ug via INTRAVENOUS
  Filled 2021-02-14: qty 1

## 2021-02-14 MED ORDER — NITROGLYCERIN 0.4 MG SL SUBL: 0.4000 mg | SUBLINGUAL_TABLET | SUBLINGUAL | Status: DC | PRN

## 2021-02-14 MED ORDER — MONTELUKAST SODIUM 10 MG PO TABS
10.0000 mg | ORAL_TABLET | Freq: Every day | ORAL | Status: DC
  Administered 2021-02-15 – 2021-02-18 (×4): 10 mg via ORAL
  Filled 2021-02-14 (×3): qty 1

## 2021-02-14 MED ORDER — CEPHALEXIN 250 MG PO CAPS
500.0000 mg | ORAL_CAPSULE | Freq: Three times a day (TID) | ORAL | Status: DC
  Administered 2021-02-14 – 2021-02-18 (×11): 500 mg via ORAL
  Filled 2021-02-14: qty 2
  Filled 2021-02-14: qty 1
  Filled 2021-02-14: qty 2
  Filled 2021-02-14: qty 1
  Filled 2021-02-14: qty 2
  Filled 2021-02-14: qty 1
  Filled 2021-02-14 (×3): qty 2
  Filled 2021-02-14 (×2): qty 1
  Filled 2021-02-14 (×2): qty 2
  Filled 2021-02-14: qty 1

## 2021-02-14 MED ORDER — SACUBITRIL-VALSARTAN 24-26 MG PO TABS
1.0000 | ORAL_TABLET | Freq: Two times a day (BID) | ORAL | Status: DC
  Administered 2021-02-14 – 2021-02-18 (×8): 1 via ORAL
  Filled 2021-02-14 (×10): qty 1

## 2021-02-14 MED ORDER — ONDANSETRON HCL 4 MG/2ML IJ SOLN: 4.0000 mg | Freq: Four times a day (QID) | INTRAMUSCULAR | Status: DC | PRN

## 2021-02-14 MED ORDER — HYDROCODONE-ACETAMINOPHEN 5-325 MG PO TABS
1.0000 | ORAL_TABLET | Freq: Four times a day (QID) | ORAL | Status: DC | PRN
  Administered 2021-02-18: 1 via ORAL
  Filled 2021-02-14: qty 1

## 2021-02-14 MED ORDER — EMPAGLIFLOZIN 25 MG PO TABS
25.0000 mg | ORAL_TABLET | Freq: Every day | ORAL | Status: DC
  Administered 2021-02-15 – 2021-02-18 (×4): 25 mg via ORAL
  Filled 2021-02-14 (×4): qty 1

## 2021-02-14 MED ORDER — NITROGLYCERIN IN D5W 200-5 MCG/ML-% IV SOLN
0.0000 ug/min | INTRAVENOUS | Status: DC
  Administered 2021-02-14: 5 ug/min via INTRAVENOUS
  Administered 2021-02-15: 15 ug/min via INTRAVENOUS
  Filled 2021-02-14: qty 250

## 2021-02-14 MED ORDER — HEPARIN (PORCINE) 25000 UT/250ML-% IV SOLN
1500.0000 [IU]/h | INTRAVENOUS | Status: DC
  Administered 2021-02-14: 1250 [IU]/h via INTRAVENOUS
  Filled 2021-02-14: qty 250

## 2021-02-14 MED ORDER — INSULIN ASPART 100 UNIT/ML IJ SOLN
0.0000 [IU] | Freq: Three times a day (TID) | INTRAMUSCULAR | Status: DC
  Administered 2021-02-15 – 2021-02-16 (×2): 2 [IU] via SUBCUTANEOUS
  Administered 2021-02-17: 3 [IU] via SUBCUTANEOUS
  Administered 2021-02-18 (×2): 2 [IU] via SUBCUTANEOUS

## 2021-02-14 MED ORDER — BUDESONIDE 0.5 MG/2ML IN SUSP
2.0000 mL | Freq: Two times a day (BID) | RESPIRATORY_TRACT | Status: DC
  Administered 2021-02-15 – 2021-02-18 (×7): 0.5 mg via RESPIRATORY_TRACT
  Filled 2021-02-14 (×7): qty 2

## 2021-02-14 MED ORDER — ACETAMINOPHEN 325 MG PO TABS: 650.0000 mg | ORAL_TABLET | ORAL | Status: DC | PRN

## 2021-02-14 MED ORDER — ROSUVASTATIN CALCIUM 20 MG PO TABS
20.0000 mg | ORAL_TABLET | Freq: Every day | ORAL | Status: DC
  Administered 2021-02-14 – 2021-02-15 (×2): 20 mg via ORAL
  Filled 2021-02-14 (×2): qty 1

## 2021-02-14 NOTE — ED Provider Notes (Signed)
[facility] EMERGENCY DEPARTMENT Provider Note   CSN: [id number] Arrival date & time:        History Chief Complaint  Patient presents with   Chest Pain    John [name] is a 70 y.o. male.  HPI 70 year old male presents via EMS with chest pain.  He is had numerous heart attacks and stents.  He developed chest pain about 2 and half hours ago while at rest.  Feels like a dull pain in his left chest.  It went down his left arm.  No shortness of breath or back pain.  The pain got better when he took a nitroglycerin but then it came back and so he called EMS.  Fire department gave him 1 nitroglycerin.  Reportedly his blood pressure with them was in the 200s.  EMS noted his blood pressure in the 90s when they first saw him and he has been given 800 cc of fluid.  He reports his pain is about 4 out of 10 now.  He was activated as a code STEMI in the field which was then canceled by cardiology.  About a week ago had toe amputation. No leg swelling.  Past Medical History:  Diagnosis Date   Atrial flutter ([place])    s/p cardioversion   Coronary artery disease    Diabetes mellitus    GERD (gastroesophageal reflux disease)    History of nuclear stress test 04/04/2011   lexiscan; mod-large in size fixed inferolateral defect (scar); non-diagnostic for ischemia; low risk scan    Hyperlipidemia    Hypertension    Left foot drop    r/t past disk srugery - uses Kevlar brace   Myocardial infarction (HCC)    posterior MI   Shortness of breath    Sleep apnea    on CPAP; 04/28/2007 split-night - AHI during total sleep 44.43/hr and REM 72.56/hr    Patient Active Problem List   Diagnosis Date Noted   NSTEMI (non-ST elevated myocardial infarction) ([place]) 02/14/2021   Diabetic infection of left foot ([place]) 78/24/2353   Chronic systolic CHF (congestive heart failure) ([place]) 02/08/2021   History of ventricular tachycardia 02/08/2021   VT (ventricular tachycardia) ([place]) 01/26/2021    Coagulation defect ([place]) 12/22/2020   Non-ST elevation (NSTEMI) myocardial infarction [facility])    Ventricular tachycardia ([place]) 08/18/2020   Capsulitis 05/21/2020   Elevated troponin    Hyperglycemia due to diabetes mellitus ([place]) 02/29/2020   Thoracic aortic aneurysm ([place]) 02/29/2020   Hypotensive episode 02/29/2020   Obesity (BMI 30.0-34.9) 02/29/2020   Lung nodule 02/19/2020   Former smoker 02/19/2020   Healthcare maintenance 02/19/2020   Acoustic neuroma ([place]) 09/11/2017   Asymmetrical sensorineural hearing loss 09/11/2017   Encounter for cardioversion procedure    Anticoagulation adequate 06/06/2016   Fatigue 10/27/2015   Bradycardia 10/27/2015   Hyperlipidemia LDL goal <70 05/20/2015   OSA on CPAP 07/23/2013   Excessive daytime sleepiness 07/23/2013   Hypertension    Hyperlipidemia    Diabetes mellitus    Chest pain 08/18/2012   Tobacco abuse 08/18/2012   Unstable angina, cath [facility] 02/27/12 02/26/2012   CAD, multiple prior RCA PCI's. Last cath 2010, Myoview low risk Oct 2012 02/26/2012   DM type 2, goal HbA1c < 7% (HCC) 02/26/2012   HTN (hypertension) 02/26/2012   Dyslipidemia, low HDL. Intol to fish oil and Noacin 02/26/2012   Sleep apnea, on C-pap 02/26/2012   COPD (chronic obstructive pulmonary disease) ([place]) 02/26/2012   Smoking, quit  one week ago 02/26/2012   GERD (gastroesophageal reflux disease) 02/26/2012    Past Surgical History:  Procedure Laterality Date   AMPUTATION TOE Left 02/10/2021   Procedure: AMPUTATION  LEFT SECOND TOE;  Surgeon: Edrick Kins, DPM;  Location: [facility];  Service: Podiatry;  Laterality: Left;   [place]  2010   6 stents total   CARDIAC CATHETERIZATION  01/2000   percutaneous transluminal coronary balloon angioplasty of mid RCA stenotic lesion   CARDIAC CATHETERIZATION  06/2006   no stenting; ischemic cardiomyopathy, EF 40-45%   CARDIOVERSION N/A 07/28/2016   Procedure: CARDIOVERSION;  Surgeon: Troy Sine, MD;  Location: [place];  Service: Cardiovascular;  Laterality: N/A;   CORONARY ANGIOPLASTY  09/1998   mid-distal RCA balloon dilatation, 4.5 & 5.0 stents    CORONARY ANGIOPLASTY WITH STENT PLACEMENT  03/1994   angioplasty & stenting (non-DES) of circumflex/prox ramus intermedius   CORONARY ANGIOPLASTY WITH STENT PLACEMENT  10/1994   large iliac [id number] stent to RCA   [place]  12/2002   4.4mm stents x2 of RCA   CORONARY ANGIOPLASTY WITH STENT PLACEMENT  01/2005   cutting balloon arthrectomy of distal RCA & Cypher DES 3.5x13; cutting balloon arthrectomy of mid RCA with Cypher DES 3.5x18   CORONARY ANGIOPLASTY WITH STENT PLACEMENT  11/2008   stenting of mid RCA with 4.0x22mm driver, non-DES   ICD IMPLANT N/A 08/20/2020   Procedure: ICD IMPLANT;  Surgeon: Constance Haw, MD;  Location: [place] CV LAB;  Service: Cardiovascular;  Laterality: N/A;   INTRAVASCULAR PRESSURE WIRE/FFR STUDY N/A 03/02/2020   Procedure: INTRAVASCULAR PRESSURE WIRE/FFR STUDY;  Surgeon: Leonie Man, MD;  Location: [place] CV LAB;  Service: Cardiovascular;  Laterality: N/A;   LEFT HEART CATH AND CORONARY ANGIOGRAPHY N/A 03/02/2020   Procedure: LEFT HEART CATH AND CORONARY ANGIOGRAPHY;  Surgeon: Leonie Man, MD;  Location: [place] CV LAB;  Service: Cardiovascular;  Laterality: N/A;   LEFT HEART CATH AND CORONARY ANGIOGRAPHY N/A 08/19/2020   Procedure: LEFT HEART CATH AND CORONARY ANGIOGRAPHY;  Surgeon: Lorretta Harp, MD;  Location: [place] CV LAB;  Service: Cardiovascular;  Laterality: N/A;   LEFT HEART CATHETERIZATION WITH CORONARY ANGIOGRAM N/A 02/27/2012   Procedure: LEFT HEART CATHETERIZATION WITH CORONARY ANGIOGRAM;  Surgeon: Lorretta Harp, MD;  Location: [facility] CATH LAB;  Service: Cardiovascular;  Laterality: N/A;   TRANSTHORACIC ECHOCARDIOGRAM  07/29/2010   EF 50=55%, mod inf wall hypokinesis & mild post wall hypokinesis; LA mild-mod dilated; mild  mitral annular calcif & mild MR; mild TR & elevated RV systolic pressure; AV mildly sclerotic; mild aortic root dilatation    V TACH ABLATION N/A 01/20/2021   Procedure: V TACH ABLATION;  Surgeon: Vickie Epley, MD;  Location: [place] CV LAB;  Service: Cardiovascular;  Laterality: N/A;       Family History  Problem Relation Age of Onset   Heart attack Father     Social History   Tobacco Use   Smoking status: Former    Packs/day: 1.00    Years: 50.00    Pack years: 50.00    Types: Cigarettes    Quit date: 07/20/2016    Years since quitting: 4.5   Smokeless tobacco: Never  Vaping Use   Vaping Use: Never used  Substance Use Topics   Alcohol use: Not Currently    Alcohol/week: 0.0 standard drinks   Drug use: No  Home Medications Prior to Admission medications   Medication Sig Start Date End Date Taking? Authorizing Provider  acetaminophen (TYLENOL) 500 MG tablet Take 500 mg by mouth every 6 (six) hours as needed for moderate pain.   Yes [provider]  albuterol (VENTOLIN HFA) 108 (90 Base) MCG/ACT inhaler Inhale 2 puffs into the lungs every 6 (six) hours as needed for wheezing or shortness of breath. 06/14/20  Yes Icard, Octavio Graves, DO  cephALEXin (KEFLEX) 500 MG capsule Take 1 capsule (500 mg total) by mouth every 8 (eight) hours for 7 days. Patient taking differently: Take 500 mg by mouth every 8 (eight) hours. Start date :02/11/21 02/11/21 02/18/21 Yes Arrien, Jimmy Picket, MD  ELIQUIS 5 MG TABS tablet TAKE 1 TABLET BY MOUTH TWICE A DAY Patient taking differently: Take 5 mg by mouth 2 (two) times daily. 10/13/20  Yes Camnitz, Will Hassell Done, MD  ENTRESTO 24-26 MG TAKE 1 TABLET BY MOUTH TWICE A DAY Patient taking differently: Take 1 tablet by mouth 2 (two) times daily. 10/08/20  Yes Troy Sine, MD  Fluticasone Furoate (ARNUITY ELLIPTA) 200 MCG/ACT AEPB Inhale 1 puff into the lungs daily. 12/29/20  Yes Icard, Bradley L, DO  insulin aspart (NOVOLOG FLEXPEN) 100  UNIT/ML FlexPen INJECT 5-20 UNITS SUBCUTANEOUSLY WITH LUNCH AND DINNER Patient taking differently: Inject 6-8 Units into the skin daily as needed for high blood sugar. 06/16/20  Yes PickardCammie Mcgee, MD  JARDIANCE 25 MG TABS tablet TAKE 1 TABLET BY MOUTH EVERY DAY Patient taking differently: Take 25 mg by mouth daily. 11/09/20  Yes [name], MD  ketoconazole (NIZORAL) 2 % shampoo Apply 1 application topically 2 (two) times a week. 02/09/21  Yes [provider]  LEVEMIR FLEXTOUCH 100 UNIT/ML FlexPen INJECT 46 UNITS INTO THE SKIN DAILY. DX:E11.9 Patient taking differently: Inject 46 Units into the skin daily. 01/11/21  Yes [name], MD  metoprolol succinate (TOPROL-XL) 100 MG 24 hr tablet Take 1 tablet (100 mg total) by mouth daily. Take with or immediately following a meal. Patient taking differently: Take 100 mg by mouth daily. 11/26/20  Yes Camnitz, Ocie Doyne, MD  mexiletine (MEXITIL) 150 MG capsule Take 2 capsules (300 mg total) by mouth every 12 (twelve) hours. Patient taking differently: Take 150 mg by mouth 2 (two) times daily. 02/09/21  Yes Baldwin Jamaica, PA-C  montelukast (SINGULAIR) 10 MG tablet TAKE 1 TABLET BY MOUTH EVERY DAY Patient taking differently: Take 10 mg by mouth daily. 08/19/20  Yes [name], MD  nitroGLYCERIN (NITROSTAT) 0.4 MG SL tablet PLACE 1 TABLET (0.4 MG TOTAL) UNDER THE TONGUE EVERY 5 (FIVE) MINUTES AS NEEDED FOR CHEST PAIN. 07/27/20  Yes Troy Sine, MD  rosuvastatin (CRESTOR) 20 MG tablet TAKE 1 TABLET BY MOUTH EVERY DAY Patient taking differently: Take 20 mg by mouth daily. 06/21/20  Yes Troy Sine, MD  sotalol (BETAPACE) 160 MG tablet Take 1 tablet (160 mg total) by mouth 2 (two) times daily. 01/28/21  Yes Baldwin Jamaica, PA-C  glucose blood (ONETOUCH ULTRA) test strip USE TO CHECK BLOOD SUGAR 3 TIMES A DAY (E11.9) 12/09/20   [name], MD  HYDROcodone-acetaminophen (NORCO/VICODIN) 5-325 MG tablet Take 1 tablet by  mouth every 6 (six) hours as needed for severe pain. Patient not taking: No sig reported 02/11/21   Arrien, Jimmy Picket, MD  Insulin Pen Needle (NOVOFINE) 32G X 6 MM MISC 1 each by Other route daily. 08/19/19   [name],  MD    Allergies    Omega-3 fatty acids, Benazepril, and Fish allergy  Review of Systems   Review of Systems  Respiratory:  Negative for shortness of breath.   Cardiovascular:  Positive for chest pain. Negative for leg swelling.  Gastrointestinal:  Negative for abdominal pain.  Musculoskeletal:  Negative for back pain.  All other systems reviewed and are negative.  Physical Exam Updated Vital Signs BP 118/88 (BP Location: Left Arm)   Pulse 75   Temp 98.3 F (36.8 C) (Oral)   Resp 17   Ht 6' (1.829 m)   Wt 98.4 kg   SpO2 95%   BMI 29.43 kg/m   Physical Exam Vitals and nursing note reviewed.  Constitutional:      Appearance: He is well-developed.  HENT:     Head: Normocephalic and atraumatic.     Right Ear: External ear normal.     Left Ear: External ear normal.     Nose: Nose normal.  Eyes:     General:        Right eye: No discharge.        Left eye: No discharge.  Cardiovascular:     Rate and Rhythm: Normal rate and regular rhythm.     Heart sounds: Normal heart sounds.  Pulmonary:     Effort: Pulmonary effort is normal.     Breath sounds: Normal breath sounds.  Abdominal:     Palpations: Abdomen is soft.     Tenderness: There is no abdominal tenderness.  Musculoskeletal:     Cervical back: Neck supple.     Right lower leg: No edema.     Left lower leg: No edema.  Skin:    General: Skin is warm and dry.  Neurological:     Mental Status: He is alert.  Psychiatric:        Mood and Affect: Mood is not anxious.    ED Results / Procedures / Treatments   Labs (all labs ordered are listed, but only abnormal results are displayed) Labs Reviewed  BASIC METABOLIC PANEL - Abnormal; Notable for the following components:      Result  Value   Glucose, Bld 136 (*)    Calcium 8.5 (*)    All other components within normal limits  D-DIMER, QUANTITATIVE - Abnormal; Notable for the following components:   D-Dimer, Quant 0.66 (*)    All other components within normal limits  HEPARIN LEVEL (UNFRACTIONATED) - Abnormal; Notable for the following components:   Heparin Unfractionated >1.10 (*)    All other components within normal limits  CBG MONITORING, ED - Abnormal; Notable for the following components:   Glucose-Capillary 115 (*)    All other components within normal limits  TROPONIN I (HIGH SENSITIVITY) - Abnormal; Notable for the following components:   Troponin I (High Sensitivity) 3,076 (*)    All other components within normal limits  TROPONIN I (HIGH SENSITIVITY) - Abnormal; Notable for the following components:   Troponin I (High Sensitivity) 2,458 (*)    All other components within normal limits  RESP PANEL BY RT-PCR (FLU A&B, COVID) ARPGX2  CBC WITH DIFFERENTIAL/PLATELET  APTT  PROTIME-INR  APTT  HEPARIN LEVEL (UNFRACTIONATED)  CBC  BASIC METABOLIC PANEL  LIPID PANEL  TROPONIN I (HIGH SENSITIVITY)  TROPONIN I (HIGH SENSITIVITY)    EKG EKG Interpretation  Date/Time:  Monday February 14 2021 17:23:03 EDT Ventricular Rate:  71 PR Interval:  143 QRS Duration: 128 QT Interval:  445 QTC Calculation:  484 R Axis:   -3 Text Interpretation: Atrial-paced complexes Nonspecific intraventricular conduction delay Inferolateral infarct, old appears similar to Jan 26 2021 Confirmed by Sherwood Gambler [phone]) on 02/14/2021 5:26:25 PM  Radiology DG Chest Portable 1 View  Result Date: 02/14/2021 CLINICAL DATA:  Acute chest pain EXAM: PORTABLE CHEST 1 VIEW COMPARISON:  01/26/2021, CT 07/26/2020, 01/17/2021 FINDINGS: Left-sided pacing device as before. Mild cardiomegaly without focal opacity, pleural effusion, or pneumothorax. Previously noted left midlung nodule for which chest CT was recommended is poorly visible on  current exam. IMPRESSION: No active disease. Mild cardiomegaly. Previously noted left midlung nodule for which chest CT was recommended is poorly visible today likely due to technique and overlying pacer generator, CT follow-up again recommended. Electronically Signed   By: Donavan Foil M.D.   On: 02/14/2021 18:17    Procedures .Critical Care  Date/Time: 02/14/2021 11:39 PM Performed by: Sherwood Gambler, MD Authorized by: Sherwood Gambler, MD   Critical care provider statement:    Critical care time (minutes):  40   Critical care time was exclusive of:  Separately billable procedures and treating other patients   Critical care was necessary to treat or prevent imminent or life-threatening deterioration of the following conditions:  Cardiac failure   Critical care was time spent personally by me on the following activities:  Discussions with consultants, evaluation of patient's response to treatment, examination of patient, ordering and performing treatments and interventions, ordering and review of laboratory studies, ordering and review of radiographic studies, pulse oximetry, re-evaluation of patient's condition, obtaining history from patient or surrogate and review of old charts   Medications Ordered in ED Medications  nitroGLYCERIN 50 mg in dextrose 5 % 250 mL (0.2 mg/mL) infusion (10 mcg/min Intravenous Rate/Dose Change 02/14/21 2228)  heparin ADULT infusion 100 units/mL (25000 units/252mL) (1,250 Units/hr Intravenous New Bag/Given 02/14/21 2040)  HYDROcodone-acetaminophen (NORCO/VICODIN) 5-325 MG per tablet 1 tablet (has no administration in time range)  cephALEXin (KEFLEX) capsule 500 mg (500 mg Oral Given 02/14/21 2229)  sacubitril-valsartan (ENTRESTO) 24-26 mg per tablet (1 tablet Oral Given 02/14/21 2231)  metoprolol succinate (TOPROL-XL) 24 hr tablet 100 mg (100 mg Oral Given 02/14/21 2230)  mexiletine (MEXITIL) capsule 300 mg (has no administration in time range)  rosuvastatin  (CRESTOR) tablet 20 mg (20 mg Oral Given 02/14/21 2230)  sotalol (BETAPACE) tablet 160 mg (160 mg Oral Given 02/14/21 2233)  empagliflozin (JARDIANCE) tablet 25 mg (has no administration in time range)  albuterol (VENTOLIN HFA) 108 (90 Base) MCG/ACT inhaler 2 puff (has no administration in time range)  budesonide (PULMICORT) nebulizer solution 0.5 mg (has no administration in time range)  montelukast (SINGULAIR) tablet 10 mg (has no administration in time range)  aspirin EC tablet 81 mg (has no administration in time range)  nitroGLYCERIN (NITROSTAT) SL tablet 0.4 mg (has no administration in time range)  acetaminophen (TYLENOL) tablet 650 mg (has no administration in time range)  ondansetron (ZOFRAN) injection 4 mg (has no administration in time range)  insulin aspart (novoLOG) injection 0-15 Units (has no administration in time range)  fentaNYL (SUBLIMAZE) injection 50 mcg (50 mcg Intravenous Given 02/14/21 1753)    ED Course  I have reviewed the triage vital signs and the nursing notes.  Pertinent labs & imaging results that were available during my care of the patient were reviewed by me and considered in my medical decision making (see chart for details).    MDM Rules/Calculators/A&P  Patient has significant cardiac history but no acute STEMI apparent.  First troponin is over 3000 which is consistent with a non-STEMI.  He is having waxing and waning pain and will be given an IV nitroglycerin drip as well as IV heparin.  He was already given aspirin.  I have discussed with Dr. Harl Bowie of cardiology who will admit. Final Clinical Impression(s) / ED Diagnoses Final diagnoses:  NSTEMI (non-ST elevated myocardial infarction) [facility])    Rx / DC Orders ED Discharge Orders     None        Sherwood Gambler, MD 02/14/21 2340

## 2021-02-14 NOTE — ED Triage Notes (Signed)
Started about 3 hrs ago with CO substernal going up in to his throat. Pt took SL 1 and fire gave 1 pt PTA took 324 ASA. CP 2/10 PTA 8/10 pt is awake and alert oriented

## 2021-02-14 NOTE — ED Notes (Signed)
Dr. Regenia Skeeter notified of critical troponin. Repeat EKG obtained. 2nd troponin collected.

## 2021-02-14 NOTE — H&P (Signed)
Cardiology Admission History and Physical:   Patient ID: John [name] MRN: [id number]; DOB: 25-Oct-1950   Admission date: 02/14/2021  PCP:  [name], MD   [facility] Providers Cardiologist:  Shelva Majestic, MD  Electrophysiologist:  Constance Haw, MD  {   Chief Complaint:  Chest pain  Patient Profile:   John [name] is a 70 y.o. [name] with history of CAD with multiple prior PCIs dating back to 1996(fom notes prior multiple stents to RCA, priot stent to [facility], HTN, OSA, aflutter, ventricular tachycardia with ICD followed by EP on beta blocker, mexilitene, and sotalol with prior  VT ablation 02/19/25, chronic systolic HF who is being seen 02/14/2021 for the evaluation of chest pain.  History of Present Illness:   John [name] 70 yo [name] history of CAD with multiple prior PCIs dating back to 1996(fom notes prior multiple stents to RCA, priot stent to [place], HTN, OSA, aflutter, ventricular tachycardia with ICD followed by EP on beta blocker, mexilitene, and sotalol with prior  VT ablation 12/18/22, chronic systolic HF presents with chest pain.   Sudden onset of chest pain while at rest. Started left shoulder down into left arm, sharp/pressure. Progressed into left chest. Associated SOB. Improved with NG but quickly returned and progressed.    K 4.2 Cr 0.88 BUN 14 WBC 9.8 Hgb 15.7 Plt 163 Ddimer 0.66 Trop 3076--> EKG a paced, inferior and lateral precordial  Qwaves old  01/18/21 echo: LVEF 40-45%, grade I dd, noraml RV 08/2020 cath: Prox RCA to Dist RCA lesion is 15% stenosed. Ost RCA to Prox RCA lesion is 40% stenosed. Ramus lesion is 60% stenosed. Dist RCA lesion is 45% stenosed.    Past Medical History:  Diagnosis Date   Atrial flutter ([place])    s/p cardioversion   Coronary artery disease    Diabetes mellitus    GERD (gastroesophageal reflux disease)    History of nuclear stress test 04/04/2011   lexiscan; mod-large in size fixed inferolateral defect (scar);  non-diagnostic for ischemia; low risk scan    Hyperlipidemia    Hypertension    Left foot drop    r/t past disk srugery - uses Kevlar brace   Myocardial infarction (HCC)    posterior MI   Shortness of breath    Sleep apnea    on CPAP; 04/28/2007 split-night - AHI during total sleep 44.43/hr and REM 72.56/hr    Past Surgical History:  Procedure Laterality Date   AMPUTATION TOE Left 02/10/2021   Procedure: AMPUTATION  LEFT SECOND TOE;  Surgeon: Edrick Kins, DPM;  Location: [place];  Service: Podiatry;  Laterality: Left;   [place]  2010   6 stents total   CARDIAC CATHETERIZATION  01/2000   percutaneous transluminal coronary balloon angioplasty of mid RCA stenotic lesion   CARDIAC CATHETERIZATION  06/2006   no stenting; ischemic cardiomyopathy, EF 40-45%   CARDIOVERSION N/A 07/28/2016   Procedure: CARDIOVERSION;  Surgeon: Troy Sine, MD;  Location: [place];  Service: Cardiovascular;  Laterality: N/A;   CORONARY ANGIOPLASTY  09/1998   mid-distal RCA balloon dilatation, 4.5 & 5.0 stents    CORONARY ANGIOPLASTY WITH STENT PLACEMENT  03/1994   angioplasty & stenting (non-DES) of circumflex/prox ramus intermedius   CORONARY ANGIOPLASTY WITH STENT PLACEMENT  10/1994   large iliac [id number] stent to RCA   [place]  12/2002   4.78mm stents x2 of RCA   CORONARY ANGIOPLASTY WITH  STENT PLACEMENT  01/2005   cutting balloon arthrectomy of distal RCA & Cypher DES 3.5x13; cutting balloon arthrectomy of mid RCA with Cypher DES 3.5x18   CORONARY ANGIOPLASTY WITH STENT PLACEMENT  11/2008   stenting of mid RCA with 4.0x23mm driver, non-DES   ICD IMPLANT N/A 08/20/2020   Procedure: ICD IMPLANT;  Surgeon: Constance Haw, MD;  Location: [place] [facility];  Service: Cardiovascular;  Laterality: N/A;   INTRAVASCULAR PRESSURE WIRE/FFR STUDY N/A 03/02/2020   Procedure: INTRAVASCULAR PRESSURE WIRE/FFR STUDY;  Surgeon: Leonie Man, MD;   Location: [place] [facility];  Service: Cardiovascular;  Laterality: N/A;   LEFT HEART CATH AND CORONARY ANGIOGRAPHY N/A 03/02/2020   Procedure: LEFT HEART CATH AND CORONARY ANGIOGRAPHY;  Surgeon: Leonie Man, MD;  Location: [place] [facility];  Service: Cardiovascular;  Laterality: N/A;   LEFT HEART CATH AND CORONARY ANGIOGRAPHY N/A 08/19/2020   Procedure: LEFT HEART CATH AND CORONARY ANGIOGRAPHY;  Surgeon: Lorretta Harp, MD;  Location: [place] [facility];  Service: Cardiovascular;  Laterality: N/A;   LEFT HEART CATHETERIZATION WITH CORONARY ANGIOGRAM N/A 02/27/2012   Procedure: LEFT HEART CATHETERIZATION WITH CORONARY ANGIOGRAM;  Surgeon: Lorretta Harp, MD;  Location: [facility];  Service: Cardiovascular;  Laterality: N/A;   TRANSTHORACIC ECHOCARDIOGRAM  07/29/2010   EF 50=55%, mod inf wall hypokinesis & mild post wall hypokinesis; LA mild-mod dilated; mild mitral annular calcif & mild MR; mild TR & elevated RV systolic pressure; AV mildly sclerotic; mild aortic root dilatation    V TACH ABLATION N/A 01/20/2021   Procedure: V TACH ABLATION;  Surgeon: Vickie Epley, MD;  Location: [place] [facility];  Service: Cardiovascular;  Laterality: N/A;     Medications Prior to Admission: Prior to Admission medications   Medication Sig Start Date End Date Taking? Authorizing Provider  acetaminophen (TYLENOL) 500 MG tablet Take 500 mg by mouth every 6 (six) hours as needed for moderate pain or headache.    [provider]  albuterol (VENTOLIN HFA) 108 (90 Base) MCG/ACT inhaler Inhale 2 puffs into the lungs every 6 (six) hours as needed for wheezing or shortness of breath. 06/14/20   Icard, Octavio Graves, DO  cephALEXin (KEFLEX) 500 MG capsule Take 1 capsule (500 mg total) by mouth every 8 (eight) hours for 7 days. 02/11/21 02/18/21  Arrien, Jimmy Picket, MD  ELIQUIS 5 MG TABS tablet TAKE 1 TABLET BY MOUTH TWICE A DAY Patient taking differently: Take 5 mg by mouth 2 (two) times daily.  10/13/20   Camnitz, Will Hassell Done, MD  ENTRESTO 24-26 MG TAKE 1 TABLET BY MOUTH TWICE A DAY Patient taking differently: Take 1 tablet by mouth 2 (two) times daily. 10/08/20   Troy Sine, MD  Fluticasone Furoate (ARNUITY ELLIPTA) 200 MCG/ACT AEPB Inhale 1 puff into the lungs daily. 12/29/20   Icard, Leory Plowman L, DO  glucose blood (ONETOUCH ULTRA) test strip USE TO CHECK BLOOD SUGAR 3 TIMES A DAY (E11.9) 12/09/20   [name], MD  HYDROcodone-acetaminophen (NORCO/VICODIN) 5-325 MG tablet Take 1 tablet by mouth every 6 (six) hours as needed for severe pain. 02/11/21   Arrien, Jimmy Picket, MD  insulin aspart (NOVOLOG FLEXPEN) 100 UNIT/ML FlexPen INJECT 5-20 UNITS SUBCUTANEOUSLY WITH LUNCH AND DINNER Patient taking differently: Inject 5-10 Units into the skin 2 (two) times daily with breakfast and lunch. 06/16/20   [name], MD  Insulin Pen Needle (NOVOFINE) 32G X 6 MM MISC 1 each by Other route daily.  08/19/19   [name], MD  JARDIANCE 25 MG TABS tablet TAKE 1 TABLET BY MOUTH EVERY DAY Patient taking differently: Take 25 mg by mouth daily. 11/09/20   [name], MD  LEVEMIR FLEXTOUCH 100 UNIT/ML FlexPen INJECT 46 UNITS INTO THE SKIN DAILY. DX:E11.9 Patient taking differently: Inject 46 Units into the skin daily. 01/11/21   [name], MD  metoprolol succinate (TOPROL-XL) 100 MG 24 hr tablet Take 1 tablet (100 mg total) by mouth daily. Take with or immediately following a meal. 11/26/20   Camnitz, Ocie Doyne, MD  mexiletine (MEXITIL) 150 MG capsule Take 2 capsules (300 mg total) by mouth every 12 (twelve) hours. 02/09/21   Baldwin Jamaica, PA-C  montelukast (SINGULAIR) 10 MG tablet TAKE 1 TABLET BY MOUTH EVERY DAY Patient taking differently: Take 10 mg by mouth at bedtime. 08/19/20   [name], MD  nitroGLYCERIN (NITROSTAT) 0.4 MG SL tablet PLACE 1 TABLET (0.4 MG TOTAL) UNDER THE TONGUE EVERY 5 (FIVE) MINUTES AS NEEDED FOR CHEST PAIN. 07/27/20   Troy Sine, MD   rosuvastatin (CRESTOR) 20 MG tablet TAKE 1 TABLET BY MOUTH EVERY DAY Patient taking differently: Take 20 mg by mouth daily. 06/21/20   Troy Sine, MD  sotalol (BETAPACE) 160 MG tablet Take 1 tablet (160 mg total) by mouth 2 (two) times daily. 01/28/21   Baldwin Jamaica, PA-C     Allergies:    Allergies  Allergen Reactions   Omega-3 Fatty Acids Hives and Itching   Benazepril Other (See Comments)    hyperkalemia   Fish Allergy Itching    Social History:   Social History   Socioeconomic History   Marital status: Married    Spouse name: Not on file   Number of children: 3   Years of education: Not on file   Highest education level: Not on file  Occupational History   Occupation: Best boy: [employer]    Comment: [place], [place]. [place]  Tobacco Use   Smoking status: Former    Packs/day: 1.00    Years: 50.00    Pack years: 50.00    Types: Cigarettes    Quit date: 07/20/2016    Years since quitting: 4.5   Smokeless tobacco: Never  Vaping Use   Vaping Use: Never used  Substance and Sexual Activity   Alcohol use: Not Currently    Alcohol/week: 0.0 standard drinks   Drug use: No   Sexual activity: Yes  Other Topics Concern   Not on file  Social History Narrative   Not on file   Social Determinants of Health   Financial Resource Strain: Not on file  Food Insecurity: Not on file  Transportation Needs: Not on file  Physical Activity: Not on file  Stress: Not on file  Social Connections: Not on file  Intimate Partner Violence: Not on file    Family History:   The patient's family history includes Heart attack in his father.    ROS:  Please see the history of present illness.  All other ROS reviewed and negative.     Physical Exam/Data:   Vitals:   02/14/21 1900 02/14/21 1915 02/14/21 1930 02/14/21 2045  BP: (!) 130/105 138/80 (!) 141/92 118/78  Pulse: 69 75 71 76  Resp: 18 (!) 22 19 20   Temp:      TempSrc:      SpO2: 95% 96% 96% 96%   Weight:  Height:       No intake or output data in the 24 hours ending 02/14/21 2103 Last 3 Weights 02/14/2021 02/10/2021 02/01/2021  Weight (lbs) 217 lb 220 lb 7.4 oz 221 lb  Weight (kg) 98.431 kg 100 kg 100.245 kg     Body mass index is 29.43 kg/m.  General:  Well nourished, well developed, in no acute distress HEENT: normal Lymph: no adenopathy Neck: no JVD Endocrine:  No thryomegaly Vascular: No carotid bruits; FA pulses 2+ bilaterally without bruits  Cardiac:  normal S1, S2; RRR; no murmur  Lungs:  clear to auscultation bilaterally, no wheezing, rhonchi or rales  Abd: soft, nontender, no hepatomegaly  Ext: no edema Musculoskeletal:  No deformities, BUE and BLE strength normal and equal Skin: warm and dry  Neuro:  CNs 2-12 intact, no focal abnormalities noted Psych:  Normal affect     Laboratory Data:  High Sensitivity Troponin:   Recent Labs  Lab 01/17/21 2233 01/18/21 0027 01/26/21 1235 01/26/21 1421 02/14/21 1727  TROPONINIHS 13 17 132* 124* 3,076*      Chemistry Recent Labs  Lab 02/11/21 0243 02/14/21 1727  NA 141 138  K 4.0 4.2  CL 107 108  CO2 26 23  GLUCOSE 131* 136*  BUN 14 14  CREATININE 0.99 0.88  CALCIUM 8.8* 8.5*  GFRNONAA >60 >60  ANIONGAP 8 7    Recent Labs  Lab 02/08/21 1303  PROT 6.1*  ALBUMIN 3.3*  AST 20  ALT 20  ALKPHOS 87  BILITOT 1.0   Hematology Recent Labs  Lab 02/11/21 0549 02/14/21 1727  WBC 6.5 9.8  RBC 5.27 5.12  HGB 15.7 15.7  HCT 49.7 48.1  MCV 94.3 93.9  MCH 29.8 30.7  MCHC 31.6 32.6  RDW 13.8 13.6  PLT 169 163   BNPNo results for input(s): BNP, PROBNP in the last 168 hours.  DDimer  Recent Labs  Lab 02/14/21 1727  DDIMER 0.66*     Radiology/Studies:  DG Chest Portable 1 View  Result Date: 02/14/2021 CLINICAL DATA:  Acute chest pain EXAM: PORTABLE CHEST 1 VIEW COMPARISON:  01/26/2021, CT 07/26/2020, 01/17/2021 FINDINGS: Left-sided pacing device as before. Mild cardiomegaly without focal  opacity, pleural effusion, or pneumothorax. Previously noted left midlung nodule for which chest CT was recommended is poorly visible on current exam. IMPRESSION: No active disease. Mild cardiomegaly. Previously noted left midlung nodule for which chest CT was recommended is poorly visible today likely due to technique and overlying pacer generator, CT follow-up again recommended. Electronically Signed   By: Donavan Foil M.D.   On: 02/14/2021 18:17     Assessment and Plan:   1.NSTEMI - extensive CAD history as reported above - presents with chest pain, significant troponin elevation. EKG with old Lesly Rubenstein no specific acute ischemic changes - medical therapy overnight with ASA, hep gtt, crestor 20,nitro gtt, ARB as part of entresto, toprol - plan for cath in the AM  -last eliquis dose Monday morning around 9AM  I have reviewed the risks, indications, and alternatives to cardiac catheterization, possible angioplasty, and stenting with the patient today. Risks include but are not limited to bleeding, infection, vascular injury, stroke, myocardial infection, arrhythmia, kidney injury, radiation-related injury in the case of prolonged fluoroscopy use, emergency cardiac surgery, and death. The patient understands the risks of serious complication is 1-2 in 5329 with diagnostic cardiac cath and 1-2% or less with angioplasty/stenting.     2. Chronic systolic HF - appears euvolemic - continue home regimen  3. History of VT - extensive history as reported above - has ICD, on beta blocker, sotalol, mexilitene with prior VT ablation - infrequent NSVT on tele short in duration, continue his home regimen  4. Osteomyelitis - s/p recent left second toe amputation 8/25 - on keflex at home, continue - due for wound care/podiatry follow up Wedneday as outpatient, if remains admitted may need to contact podiatry Dr Daylene Katayama   Risk Assessment/Risk Scores:    TIMI Risk Score for Unstable Angina or Non-ST  Elevation MI:   The patient's TIMI risk score is 6, which indicates a 41% risk of all cause mortality, new or recurrent myocardial infarction or need for urgent revascularization in the next 14 days.       Severity of Illness: The appropriate patient status for this patient is INPATIENT. Inpatient status is judged to be reasonable and necessary in order to provide the required intensity of service to ensure the patient's safety. The patient's presenting symptoms, physical exam findings, and initial radiographic and laboratory data in the context of their chronic comorbidities is felt to place them at high risk for further clinical deterioration. Furthermore, it is not anticipated that the patient will be medically stable for discharge from the hospital within 2 midnights of admission. The following factors support the patient status of inpatient.   " The patient's presenting symptoms include chest pain. " The worrisome physical exam findings include . " The initial radiographic and laboratory data are worrisome because of elevated troponin. " The chronic co-morbidities include CAD, chronic systolic HF.   * I certify that at the point of admission it is my clinical judgment that the patient will require inpatient hospital care spanning beyond 2 midnights from the point of admission due to high intensity of service, high risk for further deterioration and high frequency of surveillance required.*   For questions or updates, please contact [place] Please consult [email or web address] for contact info under     Signed, Carlyle Dolly, MD  02/14/2021 9:03 PM

## 2021-02-14 NOTE — ED Notes (Signed)
[name] wife [phone] Jenny Reichmann daughter [phone]

## 2021-02-14 NOTE — Progress Notes (Signed)
ANTICOAGULATION CONSULT NOTE - Initial Consult  Pharmacy Consult for heparin Indication: chest pain/ACS  Allergies  Allergen Reactions   Omega-3 Fatty Acids Hives and Itching   Benazepril Other (See Comments)    hyperkalemia   Fish Allergy Itching    Patient Measurements: Height: 6' (182.9 cm) Weight: 98.4 kg (217 lb) IBW/kg (Calculated) : 77.6 Heparin Dosing Weight: 97.4 kg  Vital Signs: Temp: 98.3 F (36.8 C) (08/29 1726) Temp Source: Oral (08/29 1726) BP: 141/92 (08/29 1930) Pulse Rate: 71 (08/29 1930)  Labs: Recent Labs    02/14/21 1727  HGB 15.7  HCT 48.1  PLT 163  CREATININE 0.88  TROPONINIHS 3,076*    Estimated Creatinine Clearance: 92.2 mL/min (by C-G formula based on SCr of 0.88 mg/dL).   Medical History: Past Medical History:  Diagnosis Date   Atrial flutter ([place])    s/p cardioversion   Coronary artery disease    Diabetes mellitus    GERD (gastroesophageal reflux disease)    History of nuclear stress test 04/04/2011   lexiscan; mod-large in size fixed inferolateral defect (scar); non-diagnostic for ischemia; low risk scan    Hyperlipidemia    Hypertension    Left foot drop    r/t past disk srugery - uses Kevlar brace   Myocardial infarction (HCC)    posterior MI   Shortness of breath    Sleep apnea    on CPAP; 04/28/2007 split-night - AHI during total sleep 44.43/hr and REM 72.56/hr   Medications: see MAR  Assessment: 70 yo M with substernal chest pain. PMHx with CAD w/ multiple prior PCIs, aflutter (PTA eliquis), Vtach w/ ICD in place, HF.   Goal of Therapy:  Heparin level 0.3-0.7 units/ml aPTT 66-102 seconds Monitor platelets by anticoagulation protocol: Yes   Plan:  Start heparin infusion at 1250u/hr (no bolus given PTA DOAC use) Check aPTT in 8h F/u daily HL, aPTT and CBC Monitor for s/sx of any bleeding  Joetta Manners, PharmD, [facility] Emergency Medicine Clinical Pharmacist ED RPh Phone: [place]: [phone]

## 2021-02-15 ENCOUNTER — Encounter (HOSPITAL_COMMUNITY)
Admission: EM | Disposition: A | Discharge status: Rehabilitation Facility | Payer: Self-pay | Source: Home / Self Care | Attending: Cardiology

## 2021-02-15 ENCOUNTER — Inpatient Hospital Stay (HOSPITAL_COMMUNITY): Payer: Medicare Other

## 2021-02-15 ENCOUNTER — Telehealth: Payer: Self-pay | Admitting: Podiatry

## 2021-02-15 DIAGNOSIS — E1169 Type 2 diabetes mellitus with other specified complication: Secondary | ICD-10-CM

## 2021-02-15 DIAGNOSIS — I249 Acute ischemic heart disease, unspecified: Secondary | ICD-10-CM

## 2021-02-15 DIAGNOSIS — I5042 Chronic combined systolic (congestive) and diastolic (congestive) heart failure: Secondary | ICD-10-CM | POA: Diagnosis not present

## 2021-02-15 DIAGNOSIS — I2510 Atherosclerotic heart disease of native coronary artery without angina pectoris: Secondary | ICD-10-CM

## 2021-02-15 DIAGNOSIS — I25110 Atherosclerotic heart disease of native coronary artery with unstable angina pectoris: Secondary | ICD-10-CM | POA: Diagnosis not present

## 2021-02-15 DIAGNOSIS — Z7901 Long term (current) use of anticoagulants: Secondary | ICD-10-CM

## 2021-02-15 DIAGNOSIS — I480 Paroxysmal atrial fibrillation: Secondary | ICD-10-CM

## 2021-02-15 DIAGNOSIS — E118 Type 2 diabetes mellitus with unspecified complications: Secondary | ICD-10-CM

## 2021-02-15 DIAGNOSIS — R29818 Other symptoms and signs involving the nervous system: Secondary | ICD-10-CM

## 2021-02-15 DIAGNOSIS — E785 Hyperlipidemia, unspecified: Secondary | ICD-10-CM

## 2021-02-15 DIAGNOSIS — R079 Chest pain, unspecified: Secondary | ICD-10-CM | POA: Diagnosis not present

## 2021-02-15 DIAGNOSIS — R414 Neurologic neglect syndrome: Secondary | ICD-10-CM

## 2021-02-15 DIAGNOSIS — I214 Non-ST elevation (NSTEMI) myocardial infarction: Secondary | ICD-10-CM | POA: Diagnosis not present

## 2021-02-15 HISTORY — PX: LEFT HEART CATH AND CORONARY ANGIOGRAPHY: CATH118249

## 2021-02-15 HISTORY — PX: CORONARY BALLOON ANGIOPLASTY: CATH118233

## 2021-02-15 LAB — POCT ACTIVATED CLOTTING TIME
Activated Clotting Time: 358 seconds
Activated Clotting Time: 485 seconds
Activated Clotting Time: 248 seconds
Activated Clotting Time: 196 s
Activated Clotting Time: 155 seconds

## 2021-02-15 LAB — ECHOCARDIOGRAM COMPLETE
AR max vel: 5.23 cm2
AV Area VTI: 5.19 cm2
AV Area mean vel: 4.94 cm2
AV Mean grad: 3 mmHg
AV Peak grad: 4.5 mmHg
Ao pk vel: 1.06 m/s
Area-P 1/2: 2.53 cm2
Calc EF: 41.2 %
Height: 72 in
S' Lateral: 5.7 cm
Single Plane A2C EF: 32.9 %
Single Plane A4C EF: 49.9 %
Weight: 3472 oz

## 2021-02-15 LAB — BASIC METABOLIC PANEL
Anion gap: 8 (ref 5–15)
BUN: 11 mg/dL (ref 8–23)
CO2: 20 mmol/L — ABNORMAL LOW (ref 22–32)
Calcium: 8.6 mg/dL — ABNORMAL LOW (ref 8.9–10.3)
Chloride: 110 mmol/L (ref 98–111)
Creatinine, Ser: 0.78 mg/dL (ref 0.61–1.24)
GFR, Estimated: >60 mL/min (ref >60)
Glucose, Bld: 132 mg/dL — ABNORMAL HIGH (ref 70–99)
Potassium: 3.8 mmol/L (ref 3.5–5.1)
Sodium: 138 mmol/L (ref 135–145)

## 2021-02-15 LAB — CBC
HCT: 47.4 % (ref 39.0–52.0)
Hemoglobin: 15.5 g/dL (ref 13.0–17.0)
MCH: 30.4 pg (ref 26.0–34.0)
MCHC: 32.7 g/dL (ref 30.0–36.0)
MCV: 92.9 fL (ref 80.0–100.0)
Platelets: 156 10*3/uL (ref 150–400)
RBC: 5.1 MIL/uL (ref 4.22–5.81)
RDW: 13.7 % (ref 11.5–15.5)
WBC: 7.9 10*3/uL (ref 4.0–10.5)
nRBC: 0 % (ref 0.0–0.2)

## 2021-02-15 LAB — GLUCOSE, CAPILLARY
Glucose-Capillary: 108 mg/dL — ABNORMAL HIGH (ref 70–99)
Glucose-Capillary: 63 mg/dL — ABNORMAL LOW (ref 70–99)
Glucose-Capillary: 82 mg/dL (ref 70–99)

## 2021-02-15 LAB — LIPID PANEL
Cholesterol: 101 mg/dL (ref 0–200)
HDL: 34 mg/dL — ABNORMAL LOW (ref >40)
LDL Cholesterol: 51 mg/dL (ref 0–99)
Total CHOL/HDL Ratio: 3 RATIO
Triglycerides: 80 mg/dL (ref <150)
VLDL: 16 mg/dL (ref 0–40)

## 2021-02-15 LAB — CBG MONITORING, ED
Glucose-Capillary: 124 mg/dL — ABNORMAL HIGH (ref 70–99)
Glucose-Capillary: 110 mg/dL — ABNORMAL HIGH (ref 70–99)

## 2021-02-15 LAB — HEPARIN LEVEL (UNFRACTIONATED): Heparin Unfractionated: 0.95 IU/mL — ABNORMAL HIGH (ref 0.30–0.70)

## 2021-02-15 LAB — APTT: aPTT: 43 seconds — ABNORMAL HIGH (ref 24–36)

## 2021-02-15 LAB — MRSA NEXT GEN BY PCR, NASAL: MRSA by PCR Next Gen: NOT DETECTED

## 2021-02-15 SURGERY — LEFT HEART CATH AND CORONARY ANGIOGRAPHY
Anesthesia: LOCAL

## 2021-02-15 MED ORDER — HYDRALAZINE HCL 20 MG/ML IJ SOLN: 10.0000 mg | INTRAMUSCULAR | Status: AC | PRN

## 2021-02-15 MED ORDER — SODIUM CHLORIDE 0.9% FLUSH
3.0000 mL | Freq: Two times a day (BID) | INTRAVENOUS | Status: DC
  Administered 2021-02-15 – 2021-02-18 (×6): 3 mL via INTRAVENOUS

## 2021-02-15 MED ORDER — HEPARIN (PORCINE) IN NACL 1000-0.9 UT/500ML-% IV SOLN
INTRAVENOUS | Status: DC | PRN
  Administered 2021-02-15 (×2): 500 mL

## 2021-02-15 MED ORDER — FENTANYL CITRATE (PF) 100 MCG/2ML IJ SOLN
INTRAMUSCULAR | Status: AC
  Filled 2021-02-15: qty 2

## 2021-02-15 MED ORDER — MIDAZOLAM HCL 2 MG/2ML IJ SOLN
INTRAMUSCULAR | Status: AC
  Filled 2021-02-15: qty 2

## 2021-02-15 MED ORDER — SODIUM CHLORIDE 0.9 % IV SOLN
250.0000 mL | INTRAVENOUS | Status: DC | PRN
  Administered 2021-02-16: 250 mL via INTRAVENOUS

## 2021-02-15 MED ORDER — VERAPAMIL HCL 2.5 MG/ML IV SOLN
INTRAVENOUS | Status: DC | PRN
  Administered 2021-02-15: 10 mL via INTRA_ARTERIAL

## 2021-02-15 MED ORDER — ONDANSETRON HCL 4 MG/2ML IJ SOLN: 4.0000 mg | Freq: Four times a day (QID) | INTRAMUSCULAR | Status: DC | PRN

## 2021-02-15 MED ORDER — HEPARIN SODIUM (PORCINE) 1000 UNIT/ML IJ SOLN
INTRAMUSCULAR | Status: AC
  Filled 2021-02-15: qty 1

## 2021-02-15 MED ORDER — SODIUM CHLORIDE 0.9 % IV SOLN: INTRAVENOUS | Status: AC

## 2021-02-15 MED ORDER — VERAPAMIL HCL 2.5 MG/ML IV SOLN
INTRAVENOUS | Status: AC
  Filled 2021-02-15: qty 2

## 2021-02-15 MED ORDER — CLOPIDOGREL BISULFATE 300 MG PO TABS
ORAL_TABLET | ORAL | Status: DC | PRN
  Administered 2021-02-15: 600 mg via ORAL

## 2021-02-15 MED ORDER — ASPIRIN 81 MG PO CHEW: 81.0000 mg | CHEWABLE_TABLET | Freq: Every day | ORAL | Status: DC

## 2021-02-15 MED ORDER — HEPARIN (PORCINE) IN NACL 1000-0.9 UT/500ML-% IV SOLN
INTRAVENOUS | Status: AC
  Filled 2021-02-15: qty 1000

## 2021-02-15 MED ORDER — SODIUM CHLORIDE 0.9 % IV SOLN: 250.0000 mL | INTRAVENOUS | Status: DC | PRN

## 2021-02-15 MED ORDER — SODIUM CHLORIDE 0.9% FLUSH: 3.0000 mL | INTRAVENOUS | Status: DC | PRN

## 2021-02-15 MED ORDER — FAMOTIDINE IN NACL 20-0.9 MG/50ML-% IV SOLN
INTRAVENOUS | Status: AC
  Filled 2021-02-15: qty 50

## 2021-02-15 MED ORDER — LIDOCAINE HCL (PF) 1 % IJ SOLN
INTRAMUSCULAR | Status: DC | PRN
  Administered 2021-02-15: 16 mL
  Administered 2021-02-15: 2 mL

## 2021-02-15 MED ORDER — IOHEXOL 350 MG/ML SOLN
INTRAVENOUS | Status: DC | PRN
  Administered 2021-02-15: 265 mL

## 2021-02-15 MED ORDER — CLOPIDOGREL BISULFATE 75 MG PO TABS
75.0000 mg | ORAL_TABLET | Freq: Every day | ORAL | Status: DC
  Administered 2021-02-16 – 2021-02-18 (×3): 75 mg via ORAL
  Filled 2021-02-15 (×3): qty 1

## 2021-02-15 MED ORDER — LABETALOL HCL 5 MG/ML IV SOLN: 10.0000 mg | INTRAVENOUS | Status: AC | PRN

## 2021-02-15 MED ORDER — FENTANYL CITRATE (PF) 100 MCG/2ML IJ SOLN
INTRAMUSCULAR | Status: DC | PRN
  Administered 2021-02-15 (×2): 25 ug via INTRAVENOUS

## 2021-02-15 MED ORDER — SODIUM CHLORIDE 0.9 % IV SOLN: INTRAVENOUS | Status: DC

## 2021-02-15 MED ORDER — LIDOCAINE HCL (PF) 1 % IJ SOLN
INTRAMUSCULAR | Status: AC
  Filled 2021-02-15: qty 30

## 2021-02-15 MED ORDER — SODIUM CHLORIDE 0.9% FLUSH: 3.0000 mL | Freq: Two times a day (BID) | INTRAVENOUS | Status: DC

## 2021-02-15 MED ORDER — DEXTROSE 50 % IV SOLN
INTRAVENOUS | Status: AC
  Filled 2021-02-15: qty 50

## 2021-02-15 MED ORDER — NITROGLYCERIN 1 MG/10 ML FOR IR/CATH LAB
INTRA_ARTERIAL | Status: DC | PRN
  Administered 2021-02-15: 200 ug via INTRACORONARY
  Administered 2021-02-15: 200 ug
  Administered 2021-02-15: 200 ug via INTRACORONARY

## 2021-02-15 MED ORDER — IOHEXOL 350 MG/ML SOLN
100.0000 mL | Freq: Once | INTRAVENOUS | Status: AC | PRN
  Administered 2021-02-15: 100 mL via INTRAVENOUS

## 2021-02-15 MED ORDER — NITROGLYCERIN 1 MG/10 ML FOR IR/CATH LAB
INTRA_ARTERIAL | Status: AC
  Filled 2021-02-15: qty 10

## 2021-02-15 MED ORDER — ATROPINE SULFATE 1 MG/10ML IJ SOSY
PREFILLED_SYRINGE | INTRAMUSCULAR | Status: AC
  Filled 2021-02-15: qty 10

## 2021-02-15 MED ORDER — DEXTROSE 50 % IV SOLN
INTRAVENOUS | Status: DC | PRN
Start: 2021-02-15 — End: 2021-02-15
  Administered 2021-02-15: 25 mL via INTRAVENOUS

## 2021-02-15 MED ORDER — CHLORHEXIDINE GLUCONATE CLOTH 2 % EX PADS
6.0000 | MEDICATED_PAD | Freq: Every day | CUTANEOUS | Status: DC
  Administered 2021-02-15 – 2021-02-18 (×3): 6 via TOPICAL

## 2021-02-15 MED ORDER — MIDAZOLAM HCL 2 MG/2ML IJ SOLN
INTRAMUSCULAR | Status: DC | PRN
  Administered 2021-02-15 (×3): 1 mg via INTRAVENOUS

## 2021-02-15 MED ORDER — CLOPIDOGREL BISULFATE 300 MG PO TABS
ORAL_TABLET | ORAL | Status: AC
  Filled 2021-02-15: qty 2

## 2021-02-15 MED ORDER — ACETAMINOPHEN 325 MG PO TABS: 650.0000 mg | ORAL_TABLET | ORAL | Status: DC | PRN

## 2021-02-15 MED ORDER — HEPARIN SODIUM (PORCINE) 1000 UNIT/ML IJ SOLN
INTRAMUSCULAR | Status: DC | PRN
  Administered 2021-02-15: 2000 [IU] via INTRAVENOUS
  Administered 2021-02-15: 10000 [IU] via INTRAVENOUS

## 2021-02-15 MED ORDER — FAMOTIDINE IN NACL 20-0.9 MG/50ML-% IV SOLN
INTRAVENOUS | Status: DC | PRN
  Administered 2021-02-15: 20 mg via INTRAVENOUS

## 2021-02-15 MED ORDER — ROSUVASTATIN CALCIUM 20 MG PO TABS
40.0000 mg | ORAL_TABLET | Freq: Every day | ORAL | Status: DC
  Administered 2021-02-16 – 2021-02-18 (×3): 40 mg via ORAL
  Filled 2021-02-15 (×3): qty 2

## 2021-02-15 SURGICAL SUPPLY — 28 items
BALL SAPPHIRE NC24 3.5X22 (BALLOONS) ×2
BALLN SAPPHIRE 2.5X12 (BALLOONS) ×2
BALLN SAPPHIRE ~~LOC~~ 3.0X18 (BALLOONS) ×4 IMPLANT
BALLN SCOREFLEX 3.50X15 (BALLOONS) ×2
BALLOON SAPPHIRE 2.5X12 (BALLOONS) ×1 IMPLANT
BALLOON SAPPHIRE NC24 3.5X22 (BALLOONS) ×1 IMPLANT
BALLOON SCOREFLEX 3.50X15 (BALLOONS) ×1 IMPLANT
CATH INFINITI 5FR JL4 (CATHETERS) ×2 IMPLANT
CATH INFINITI 5FR JL5 (CATHETERS) ×2 IMPLANT
CATH INFINITI JR4 5F (CATHETERS) ×2 IMPLANT
CATH OPTITORQUE TIG 4.0 5F (CATHETERS) ×2 IMPLANT
CATH VISTA GUIDE 6FR JR4 (CATHETERS) ×2 IMPLANT
DEVICE RAD COMP TR BAND LRG (VASCULAR PRODUCTS) ×2 IMPLANT
GLIDESHEATH SLEND SS 6F .021 (SHEATH) ×2 IMPLANT
GUIDEWIRE INQWIRE 1.5J.035X260 (WIRE) ×1 IMPLANT
INQWIRE 1.5J .035X260CM (WIRE) ×2
KIT ENCORE 26 ADVANTAGE (KITS) ×2 IMPLANT
KIT HEART LEFT (KITS) ×2 IMPLANT
PACK CARDIAC CATHETERIZATION (CUSTOM PROCEDURE TRAY) ×2 IMPLANT
SHEATH PINNACLE 5F 10CM (SHEATH) ×2 IMPLANT
SHEATH PINNACLE 6F 10CM (SHEATH) ×2 IMPLANT
SHEATH PROBE COVER 6X72 (BAG) ×2 IMPLANT
STENT ONYX FRONTIER 2.75X22 (Permanent Stent) ×2 IMPLANT
TRANSDUCER W/STOPCOCK (MISCELLANEOUS) ×2 IMPLANT
TUBING CIL FLEX 10 FLL-RA (TUBING) ×2 IMPLANT
WIRE COUGAR XT STRL 190CM (WIRE) ×2 IMPLANT
WIRE EMERALD 3MM-J .035X150CM (WIRE) ×2 IMPLANT
WIRE HI TORQ VERSACORE-J 145CM (WIRE) ×2 IMPLANT

## 2021-02-15 NOTE — Progress Notes (Addendum)
Progress Note  Patient Name: John [name] Date of Encounter: 02/15/2021  [facility] Cardiologist: Shelva Majestic, MD   Subjective   Chest pain has subsided while on IV nitro. Left shoulder pain this morning.   Inpatient Medications    Scheduled Meds:  aspirin EC  81 mg Oral Daily   budesonide  2 mL Inhalation BID   cephALEXin  500 mg Oral Q8H   empagliflozin  25 mg Oral Daily   insulin aspart  0-15 Units Subcutaneous TID WC   metoprolol succinate  100 mg Oral Daily   mexiletine  300 mg Oral Q12H   montelukast  10 mg Oral Daily   rosuvastatin  20 mg Oral Daily   sacubitril-valsartan  1 tablet Oral BID   sotalol  160 mg Oral BID   Continuous Infusions:  heparin 1,250 Units/hr (02/15/21 0501)   nitroGLYCERIN 20 mcg/min (02/15/21 0503)   PRN Meds: acetaminophen, albuterol, HYDROcodone-acetaminophen, nitroGLYCERIN, ondansetron (ZOFRAN) IV   Vital Signs    Vitals:   02/15/21 0600 02/15/21 0615 02/15/21 0645 02/15/21 0653  BP: 107/75 106/69 (!) 119/91   Pulse: 69 70 73 70  Resp: 16 18 (!) 23 17  Temp:      TempSrc:      SpO2: 96% 94% 95% 95%  Weight:      Height:        Intake/Output Summary (Last 24 hours) at 02/15/2021 0716 Last data filed at 02/15/2021 0503 Gross per 24 hour  Intake 137.41 ml  Output --  Net 137.41 ml   Last 3 Weights 02/14/2021 02/10/2021 02/01/2021  Weight (lbs) 217 lb 220 lb 7.4 oz 221 lb  Weight (kg) 98.431 kg 100 kg 100.245 kg      Telemetry    Several short runs of NSVT overnight, few PVCs this morning, a-paced- Personally Reviewed  ECG    SR, a-paced Nonspecific IVCD - Personally Reviewed  Physical Exam   GEN: No acute distress.   Neck: No JVD Cardiac: RRR, no murmurs, rubs, or gallops.  Respiratory: Clear to auscultation bilaterally. GI: Soft, nontender, non-distended  MS: 1+ RLE edema, toes are wrapped Neuro:  Nonfocal  Psych: Normal affect   Labs    High Sensitivity Troponin:   Recent Labs  Lab 01/26/21 1235  01/26/21 1421 02/14/21 1727 02/14/21 1927 02/14/21 2237  TROPONINIHS 132* 124* 3,076* 2,458* 2,923*      Chemistry Recent Labs  Lab 02/08/21 1303 02/09/21 0350 02/11/21 0243 02/14/21 1727 02/15/21 0342  NA 141   < > 141 138 138  K 4.0   < > 4.0 4.2 3.8  CL 108   < > 107 108 110  CO2 26   < > 26 23 20*  GLUCOSE 218*   < > 131* 136* 132*  BUN 13   < > 14 14 11   CREATININE 1.06   < > 0.99 0.88 0.78  CALCIUM 9.2   < > 8.8* 8.5* 8.6*  PROT 6.1*  --   --   --   --   ALBUMIN 3.3*  --   --   --   --   AST 20  --   --   --   --   ALT 20  --   --   --   --   ALKPHOS 87  --   --   --   --   BILITOT 1.0  --   --   --   --   GFRNONAA >60   < > >  60 >60 >60  ANIONGAP 7   < > 8 7 8    < > = values in this interval not displayed.     Hematology Recent Labs  Lab 02/11/21 0549 02/14/21 1727 02/15/21 0342  WBC 6.5 9.8 7.9  RBC 5.27 5.12 5.10  HGB 15.7 15.7 15.5  HCT 49.7 48.1 47.4  MCV 94.3 93.9 92.9  MCH 29.8 30.7 30.4  MCHC 31.6 32.6 32.7  RDW 13.8 13.6 13.7  PLT 169 163 156    BNPNo results for input(s): BNP, PROBNP in the last 168 hours.   DDimer  Recent Labs  Lab 02/14/21 1727  DDIMER 0.66*     Radiology    DG Chest Portable 1 View  Result Date: 02/14/2021 CLINICAL DATA:  Acute chest pain EXAM: PORTABLE CHEST 1 VIEW COMPARISON:  01/26/2021, CT 07/26/2020, 01/17/2021 FINDINGS: Left-sided pacing device as before. Mild cardiomegaly without focal opacity, pleural effusion, or pneumothorax. Previously noted left midlung nodule for which chest CT was recommended is poorly visible on current exam. IMPRESSION: No active disease. Mild cardiomegaly. Previously noted left midlung nodule for which chest CT was recommended is poorly visible today likely due to technique and overlying pacer generator, CT follow-up again recommended. Electronically Signed   By: Donavan Foil M.D.   On: 02/14/2021 18:17    Cardiac Studies   Echo: 01/18/21  IMPRESSIONS     1. Left ventricular  ejection fraction, by estimation, is 40 to 45%. The  left ventricle has mildly decreased function. The left ventricle  demonstrates regional wall motion abnormalities (see scoring  diagram/findings for description). Left ventricular  diastolic parameters are consistent with Grade I diastolic dysfunction  (impaired relaxation). There is mild dyskinesis of the left ventricular,  basal inferior wall and inferolateral wall.   2. Right ventricular systolic function is normal. The right ventricular  size is normal. Tricuspid regurgitation signal is inadequate for assessing  PA pressure.   3. Left atrial size was moderately dilated.   4. Right atrial size was mildly dilated.   5. The mitral valve is normal in structure. Mild mitral valve  regurgitation.   6. The aortic valve is tricuspid. Aortic valve regurgitation is not  visualized.   7. Aortic dilatation noted. There is mild dilatation of the aortic root,  measuring 40 mm. There is mild dilatation of the ascending aorta,  measuring 41 mm.   FINDINGS   Left Ventricle: Left ventricular ejection fraction, by estimation, is 40  to 45%. The left ventricle has mildly decreased function. The left  ventricle demonstrates regional wall motion abnormalities. Mild dyskinesis  of the left ventricular, basal  inferior wall and inferolateral wall. The left ventricular internal cavity  size was normal in size. There is no left ventricular hypertrophy. Left  ventricular diastolic parameters are consistent with Grade I diastolic  dysfunction (impaired relaxation).  Normal left ventricular filling pressure.      LV Wall Scoring:  The basal inferolateral segment and basal inferior segment are dyskinetic.  The mid inferolateral segment and basal inferoseptal segment are akinetic.   Right Ventricle: The right ventricular size is normal. No increase in  right ventricular wall thickness. Right ventricular systolic function is  normal. Tricuspid  regurgitation signal is inadequate for assessing PA  pressure.   Left Atrium: Left atrial size was moderately dilated.   Right Atrium: Right atrial size was mildly dilated.   Pericardium: There is no evidence of pericardial effusion.   Mitral Valve: The mitral valve is normal in  structure. Mild mitral valve  regurgitation, with centrally-directed jet.   Tricuspid Valve: The tricuspid valve is normal in structure. Tricuspid  valve regurgitation is not demonstrated.   Aortic Valve: The aortic valve is tricuspid. Aortic valve regurgitation is  not visualized.   Pulmonic Valve: The pulmonic valve was normal in structure. Pulmonic valve  regurgitation is not visualized.   Aorta: Aortic dilatation noted. There is mild dilatation of the aortic  root, measuring 40 mm. There is mild dilatation of the ascending aorta,  measuring 41 mm.   IAS/Shunts: No atrial level shunt detected by color flow Doppler.   Additional Comments: A device lead is visualized in the right ventricle  and right atrium.   Patient Profile     70 y.o. male  with history of CAD with multiple prior PCIs dating back to 1996(fom notes prior multiple stents to RCA, priot stent to LCx, HTN, OSA, aflutter, ventricular tachycardia with ICD followed by EP on beta blocker, mexilitene, and sotalol with prior  VT ablation 11/21/76, chronic systolic HF who was seen 4/69/6295 for the evaluation of chest pain.  Assessment & Plan    NSTEMI: hsTn 3076. Initially paged as a STEMI but canceled. Has been on IV heparin and nitro overnight. No further episodes of chest pain. Planned for cardiac cath this morning.  -- continue IV heparin, nitro, ASA, statin, BB, entresto  Chronic combined systolic and diastolic HF: echo 8/2 with EF of 40-45% with mild dyskinesis of the left ventricular, basal inferior wall and inferolateral wall. Does not appear volume overloaded on exam -- continue metoprolol, entresto, jardiance  -- plan to add spiro  post cath -> will be able to assess LVEDP and adjust diuretics based on results.  Hx of VT: followed by EP. Has had a few short episodes of NSVT, PVCs.  -- on sotalol and mexiletine  DM: SSI, Jardiance   HLD: LDL 51 -- on statin continue home dose as lipids are well controlled.  Paroxysmal afib: A-paced on telemetry -- last Eliquis was 9am 8/29  Osteomyelitis: recent admission and discharged on 8/26 with toe amputation -- on keflex until 9/2 -- was planned for outpatient follow up with podiatry tomorrow  For questions or updates, please contact [place] Please consult [email or web address] for contact info under        Signed, Reino Bellis, NP  02/15/2021, 7:16 AM    ATTENDING ATTESTATION  I have seen, examined and evaluated the patient this AM along with Reino Bellis, NP-C.  After reviewing all the available data and chart, we discussed the patients laboratory, study & physical findings as well as symptoms in detail. I agree with her findings, examination as well as impression recommendations as per our discussion.    Attending adjustments noted in italics.   Patient with known CAD, mild cardiomyopathy with combined systolic diastolic heart failure and PAF presenting with symptoms concerning for ACS/angina with elevated troponin => non-STEMI.  Plan cardiac catheterization today.  Catheterization was delayed due to being on DOAC.  He is now well over 24 hours from his last dose of the DOAC.Faythe Ghee for cath.   Shared Decision Making/Informed Consent{ The risks [stroke (1 in 1000), death (1 in 1000), kidney failure [usually temporary] (1 in 500), bleeding (1 in 200), allergic reaction [possibly serious] (1 in 200)], benefits (diagnostic support and management of coronary artery disease) and alternatives of a cardiac catheterization were discussed in detail with Mr. Shamoon and he is willing to proceed.  Glenetta Hew, M.D., M.S. Interventional Cardiologist   Pager #  [phone] Phone # [phone] [address] [place], [place] [address]

## 2021-02-15 NOTE — Progress Notes (Signed)
Procedure: Right Femoral Artery SHEATH REMOVAL   Pre-Procedure Vital Signs: 138/79 (98) 69 HR  Time Removed: 2150 Minutes Held Pressure: 20 Site: Right Femoral Difficulties: None Post Pain Assessment: 0/10 Time Hemostasis Occurred: 2210 Method to obtain Hemostasis: Manual Pressure  Post-Procedure Vital Signs:  112/71 (83) 65 HR  Peripheral vascular: +2 Fem, +1 DP   Dressing: Guaze and Tegaderm

## 2021-02-15 NOTE — Progress Notes (Signed)
[place] for heparin Indication: chest pain/ACS  Allergies  Allergen Reactions   Omega-3 Fatty Acids Hives and Itching   Benazepril Other (See Comments)    hyperkalemia   Fish Allergy Itching    Patient Measurements: Height: 6' (182.9 cm) Weight: 98.4 kg (217 lb) IBW/kg (Calculated) : 77.6 Heparin Dosing Weight: 97.4 kg  Vital Signs: Temp: 98.3 F (36.8 C) (08/29 1726) Temp Source: Oral (08/29 1726) BP: 107/75 (08/30 0430) Pulse Rate: 70 (08/30 0430)  Labs: Recent Labs    02/14/21 1727 02/14/21 1927 02/14/21 2033 02/14/21 2237 02/15/21 0342  HGB 15.7  --   --   --  15.5  HCT 48.1  --   --   --  47.4  PLT 163  --   --   --  156  APTT  --   --  30  --  43*  LABPROT  --   --  15.1  --   --   INR  --   --  1.2  --   --   HEPARINUNFRC  --   --  >1.10*  --   --   CREATININE 0.88  --   --   --   --   TROPONINIHS 3,076* 2,458*  --  2,923*  --      Estimated Creatinine Clearance: 92.2 mL/min (by C-G formula based on SCr of 0.88 mg/dL). Assessment: 70 y.o. male admitted with chest pain, h/o aflutter and Eliquis on hold, for heparin  Goal of Therapy:  Heparin level 0.3-0.7 units/ml aPTT 66-102 seconds Monitor platelets by anticoagulation protocol: Yes   Plan:  Increase Heparin 1500 units/hr Follow up after cath today   Phillis Knack, PharmD, BCPS

## 2021-02-15 NOTE — ED Notes (Signed)
Nurse report given to [place], RN

## 2021-02-15 NOTE — Code Documentation (Signed)
Stroke Response Nurse Documentation Code Documentation  John [name] is a 70 y.o. male at [employer] cath lab  on 02/15/2021 with past medical hx of CAD, HTN, OSA, AFL, VT with ICD, CHF arrived yesterday with CP.  He was in the cath lab this afternoon. Code stroke was activated by cath team. Patient was LKW at 1645, drowsy at 1730 and now complaining of Left arm weakness and facial droop . On Eliquis (apixaban) daily and has received heparin during the case.. Stroke team at the bedside. Patient to CT with team. NIHSS 15, see documentation for details and code stroke times. Patient with right gaze preference , left hemianopia, left facial droop, left arm weakness, left leg weakness, left decreased sensation, dysarthria , and left neglect on exam. The following imaging was completed: CT, CTA head and neck, CTP. Patient is not a candidate for tPA due to heparin during the cath lab case and eliquis dose yesterday.  Per Dr Quinn Axe there is no LVO on the CTA.  Care/Plan. Bedside handoff with ICU RN Jerral Bonito.  Neuro checks and VS q 2 hours.  Raliegh Ip  Stroke Response RN

## 2021-02-15 NOTE — Consult Note (Signed)
NEUROLOGY CONSULTATION NOTE   Date of service: February 15, 2021 Patient Name: John [name] MRN:  [id number] DOB:  1950/08/04 Reason for consult: stroke code _ _ _   _ __   _ __ _ _  __ __   _ __   __ _  History of Present Illness   John [name] is a 70 y.o. male with history of CAD with multiple prior PCIs dating back to 1996(fom notes prior multiple stents to RCA, priot stent to [facility] HTN, OSA, aflutter, ventricular tachycardia with ICD followed by EP on beta blocker, mexilitene, and sotalol with prior  VT ablation 06/22/46, chronic systolic HF admitted for cardiac catheterization. He is on eliquis, last dose yesterday. He had multiple coronary stents placed today during the procedure he had a large volume of heparin and 600mg  plavix. After the procedure while he was still on the table he developed L facial droop and dense L-sided hemineglect. Stroke code was called. NIHSS = 12. Head CT showed NAICP. Given recent use of multiple anticoagulants patient was not a candidate for TNK. CTA showed no LVO therefore intervention was not indicated.  CNS imaging personally reviewed    ROS   Per HPI; all other systems reviewed and were negative  Past History   Past Medical History:  Diagnosis Date   Atrial flutter ([place])    s/p cardioversion   Coronary artery disease    Diabetes mellitus    GERD (gastroesophageal reflux disease)    History of nuclear stress test 04/04/2011   lexiscan; mod-large in size fixed inferolateral defect (scar); non-diagnostic for ischemia; low risk scan    Hyperlipidemia    Hypertension    Left foot drop    r/t past disk srugery - uses Kevlar brace   Myocardial infarction (HCC)    posterior MI   Shortness of breath    Sleep apnea    on CPAP; 04/28/2007 split-night - AHI during total sleep 44.43/hr and REM 72.56/hr   Past Surgical History:  Procedure Laterality Date   AMPUTATION TOE Left 02/10/2021   Procedure: AMPUTATION  LEFT SECOND TOE;  Surgeon: Edrick Kins, DPM;  Location: [place];  Service: Podiatry;  Laterality: Left;   [place]  2010   6 stents total   CARDIAC CATHETERIZATION  01/2000   percutaneous transluminal coronary balloon angioplasty of mid RCA stenotic lesion   CARDIAC CATHETERIZATION  06/2006   no stenting; ischemic cardiomyopathy, EF 40-45%   CARDIOVERSION N/A 07/28/2016   Procedure: CARDIOVERSION;  Surgeon: Troy Sine, MD;  Location: [place];  Service: Cardiovascular;  Laterality: N/A;   CORONARY ANGIOPLASTY  09/1998   mid-distal RCA balloon dilatation, 4.5 & 5.0 stents    CORONARY ANGIOPLASTY WITH STENT PLACEMENT  03/1994   angioplasty & stenting (non-DES) of circumflex/prox ramus intermedius   CORONARY ANGIOPLASTY WITH STENT PLACEMENT  10/1994   large iliac [id number] stent to RCA   [place]  12/2002   4.54mm stents x2 of RCA   CORONARY ANGIOPLASTY WITH STENT PLACEMENT  01/2005   cutting balloon arthrectomy of distal RCA & Cypher DES 3.5x13; cutting balloon arthrectomy of mid RCA with Cypher DES 3.5x18   CORONARY ANGIOPLASTY WITH STENT PLACEMENT  11/2008   stenting of mid RCA with 4.0x55mm driver, non-DES   ICD IMPLANT N/A 08/20/2020   Procedure: ICD IMPLANT;  Surgeon: Constance Haw, MD;  Location: [place] [facility];  Service: Cardiovascular;  Laterality: N/A;   INTRAVASCULAR PRESSURE WIRE/FFR STUDY N/A 03/02/2020   Procedure: INTRAVASCULAR PRESSURE WIRE/FFR STUDY;  Surgeon: Leonie Man, MD;  Location: [place] [facility];  Service: Cardiovascular;  Laterality: N/A;   LEFT HEART CATH AND CORONARY ANGIOGRAPHY N/A 03/02/2020   Procedure: LEFT HEART CATH AND CORONARY ANGIOGRAPHY;  Surgeon: Leonie Man, MD;  Location: [place] [facility];  Service: Cardiovascular;  Laterality: N/A;   LEFT HEART CATH AND CORONARY ANGIOGRAPHY N/A 08/19/2020   Procedure: LEFT HEART CATH AND CORONARY ANGIOGRAPHY;  Surgeon: Lorretta Harp, MD;  Location: [place]  CV LAB;  Service: Cardiovascular;  Laterality: N/A;   LEFT HEART CATHETERIZATION WITH CORONARY ANGIOGRAM N/A 02/27/2012   Procedure: LEFT HEART CATHETERIZATION WITH CORONARY ANGIOGRAM;  Surgeon: Lorretta Harp, MD;  Location: [facility] C[facility] LAB;  Service: Cardiovascular;  Laterality: N/A;   TRANSTHORACIC ECHOCARDIOGRAM  07/29/2010   EF 50=55%, mod inf wall hypokinesis & mild post wall hypokinesis; LA mild-mod dilated; mild mitral annular calcif & mild MR; mild TR & elevated RV systolic pressure; AV mildly sclerotic; mild aortic root dilatation    V TACH ABLATION N/A 01/20/2021   Procedure: V TACH ABLATION;  Surgeon: Vickie Epley, MD;  Location: [place] [facility];  Service: Cardiovascular;  Laterality: N/A;   Family History  Problem Relation Age of Onset   Heart attack Father    Social History   Socioeconomic History   Marital status: Married    Spouse name: Not on file   Number of children: 3   Years of education: Not on file   Highest education level: Not on file  Occupational History   Occupation: Best boy: [employer]    Comment: [place], [place]. [place]  Tobacco Use   Smoking status: Former    Packs/day: 1.00    Years: 50.00    Pack years: 50.00    Types: Cigarettes    Quit date: 07/20/2016    Years since quitting: 4.5   Smokeless tobacco: Never  Vaping Use   Vaping Use: Never used  Substance and Sexual Activity   Alcohol use: Not Currently    Alcohol/week: 0.0 standard drinks   Drug use: No   Sexual activity: Yes  Other Topics Concern   Not on file  Social History Narrative   Not on file   Social Determinants of Health   Financial Resource Strain: Not on file  Food Insecurity: Not on file  Transportation Needs: Not on file  Physical Activity: Not on file  Stress: Not on file  Social Connections: Not on file   Allergies  Allergen Reactions   Omega-3 Fatty Acids Hives and Itching   Benazepril Other (See Comments)    hyperkalemia   Fish Allergy  Itching    Medications   Medications Prior to Admission  Medication Sig Dispense Refill Last Dose   acetaminophen (TYLENOL) 500 MG tablet Take 500 mg by mouth every 6 (six) hours as needed for moderate pain.   02/14/2021   albuterol (VENTOLIN HFA) 108 (90 Base) MCG/ACT inhaler Inhale 2 puffs into the lungs every 6 (six) hours as needed for wheezing or shortness of breath. 1 each 6 01/31/2021   cephALEXin (KEFLEX) 500 MG capsule Take 1 capsule (500 mg total) by mouth every 8 (eight) hours for 7 days. (Patient taking differently: Take 500 mg by mouth every 8 (eight) hours. Start date :02/11/21) 21 capsule 0 02/14/2021   ELIQUIS 5 MG TABS tablet TAKE 1  TABLET BY MOUTH TWICE A DAY (Patient taking differently: Take 5 mg by mouth 2 (two) times daily.) 180 tablet 1 02/14/2021 at 10 am   ENTRESTO 24-26 MG TAKE 1 TABLET BY MOUTH TWICE A DAY (Patient taking differently: Take 1 tablet by mouth 2 (two) times daily.) 60 tablet 6 02/14/2021   Fluticasone Furoate (ARNUITY ELLIPTA) 200 MCG/ACT AEPB Inhale 1 puff into the lungs daily. 30 each 6 02/14/2021   insulin aspart (NOVOLOG FLEXPEN) 100 UNIT/ML FlexPen INJECT 5-20 UNITS SUBCUTANEOUSLY WITH LUNCH AND DINNER (Patient taking differently: Inject 6-8 Units into the skin daily as needed for high blood sugar.) 15 mL 1 01/31/2021   JARDIANCE 25 MG TABS tablet TAKE 1 TABLET BY MOUTH EVERY DAY (Patient taking differently: Take 25 mg by mouth daily.) 30 tablet 5 02/13/2021   ketoconazole (NIZORAL) 2 % shampoo Apply 1 application topically 2 (two) times a week.   02/09/2021   LEVEMIR FLEXTOUCH 100 UNIT/ML FlexPen INJECT 46 UNITS INTO THE SKIN DAILY. DX:E11.9 (Patient taking differently: Inject 46 Units into the skin daily.) 15 mL 3 02/13/2021   metoprolol succinate (TOPROL-XL) 100 MG 24 hr tablet Take 1 tablet (100 mg total) by mouth daily. Take with or immediately following a meal. (Patient taking differently: Take 100 mg by mouth daily.) 90 tablet 1 02/13/2021 at 10 pm    mexiletine (MEXITIL) 150 MG capsule Take 2 capsules (300 mg total) by mouth every 12 (twelve) hours. (Patient taking differently: Take 150 mg by mouth 2 (two) times daily.) 120 capsule 5 02/14/2021   montelukast (SINGULAIR) 10 MG tablet TAKE 1 TABLET BY MOUTH EVERY DAY (Patient taking differently: Take 10 mg by mouth daily.) 90 tablet 3 02/14/2021   nitroGLYCERIN (NITROSTAT) 0.4 MG SL tablet PLACE 1 TABLET (0.4 MG TOTAL) UNDER THE TONGUE EVERY 5 (FIVE) MINUTES AS NEEDED FOR CHEST PAIN. 25 tablet 1 02/14/2021   rosuvastatin (CRESTOR) 20 MG tablet TAKE 1 TABLET BY MOUTH EVERY DAY (Patient taking differently: Take 20 mg by mouth daily.) 90 tablet 3 02/14/2021   sotalol (BETAPACE) 160 MG tablet Take 1 tablet (160 mg total) by mouth 2 (two) times daily. 60 tablet 6 02/14/2021   glucose blood (ONETOUCH ULTRA) test strip USE TO CHECK BLOOD SUGAR 3 TIMES A DAY (E11.9) 100 strip 2    HYDROcodone-acetaminophen (NORCO/VICODIN) 5-325 MG tablet Take 1 tablet by mouth every 6 (six) hours as needed for severe pain. (Patient not taking: No sig reported) 10 tablet 0 Not Taking   Insulin Pen Needle (NOVOFINE) 32G X 6 MM MISC 1 each by Other route daily. 100 each 3      Vitals   Vitals:   02/15/21 1739 02/15/21 1743 02/15/21 1748 02/15/21 1753  BP: 100/64 105/66 100/64 109/72  Pulse: 69 69 70 74  Resp: 13 15 13  (!) 22  Temp:      TempSrc:      SpO2: 97% 98% 98% 99%  Weight:      Height:         Body mass index is 29.43 kg/m.  Physical Exam   Physical Exam Gen: A&O x4, NAD HEENT: Atraumatic, normocephalic;mucous membranes moist; oropharynx clear, tongue without atrophy or fasciculations. Neck: Supple, trachea midline. Resp: CTAB, no w/r/r CV: RRR, no m/g/r; nml S1 and S2. 2+ symmetric peripheral pulses. Abd: soft/NT/ND; nabs x 4 quad Extrem: Nml bulk; no cyanosis, clubbing, or edema.  Neuro: *MS: alert and oriented x3, follows simple commands *Speech: fluid, mild dysarthria, able to name and  repeat *  CN:    I: Deferred   II,III: PERRLA, does not blink to threat on L   III,IV,VI: R gaze preference, can cross midline with oculocephalics   V: Sensation intact from V1 to V3 to LT   VII: Eyelid closure was full.  L UMN facial droop   VIII: Hearing intact to voice   IX,X: Voice normal, palate elevates symmetrically    XI: SCM/trap 5/5 bilat   XII: Tongue protrudes midline, no atrophy or fasciculations  *Motor:   Full strength RUE and RLE. No movement LUE, some movement against gravity LLE. *Sensory: Impaired to PP LUE and LLE *Coordination:  UTA *Reflexes:  1+ and symmetric throughout without clonus; toes down-going bilat *Gait: deferred NIHSS  1a Level of Conscious.: 0 1b LOC Questions: 0 1c LOC Commands: 0 2 Best Gaze: 1 3 Visual: 1 4 Facial Palsy: 1 5a Motor Arm - left: 4 5b Motor Arm - Right: 0 6a Motor Leg - Left: 2 6b Motor Leg - Right: 0 7 Limb Ataxia: 0 8 Sensory: 0 9 Best Language: 0 10 Dysarthria: 1 11 Extinct. and Inatten.: 2  TOTAL: 12   Premorbid mRS = 1   Labs   CBC:  Recent Labs  Lab 02/11/21 0549 02/14/21 1727 02/15/21 0342  WBC 6.5 9.8 7.9  NEUTROABS 4.4 7.7  --   HGB 15.7 15.7 15.5  HCT 49.7 48.1 47.4  MCV 94.3 93.9 92.9  PLT 169 163 093    Basic Metabolic Panel:  Lab Results  Component Value Date   NA 138 02/15/2021   K 3.8 02/15/2021   CO2 20 (L) 02/15/2021   GLUCOSE 132 (H) 02/15/2021   BUN 11 02/15/2021   CREATININE 0.78 02/15/2021   CALCIUM 8.6 (L) 02/15/2021   GFRNONAA >60 02/15/2021   GFRAA >60 02/28/2020   Lipid Panel:  Lab Results  Component Value Date   LDLCALC 51 02/15/2021   HgbA1c:  Lab Results  Component Value Date   HGBA1C 8.0 (H) 02/08/2021   Urine Drug Screen: No results found for: LABOPIA, COCAINSCRNUR, LABBENZ, AMPHETMU, THCU, LABBARB  Alcohol Level No results found for: [facility]   Impression   John [name] is a 70 y.o. male with history of CAD with multiple prior PCIs dating back to  1996(fom notes prior multiple stents to RCA, priot stent to [place], HTN, OSA, aflutter, ventricular tachycardia with ICD followed by EP on beta blocker, mexilitene, and sotalol with prior  VT ablation 07/22/53, chronic systolic HF admitted for cardiac catheterization. During the procedure he had acute onset of L facial droop and dense L-sided neglect and stroke code was called. TNK was not administered 2/2 contraindication of recent anticoagulation. CTA showed no LVO therefore intervention was not indicated.  Recommendations   - Permissive HTN x48 hrs from sx onset or until stroke ruled out by MRI goal BP <220/110. PRN labetalol or hydralazine if BP above these parameters. Avoid oral antihypertensives. - MRI brain wo contrast - TTE - Check A1c and LDL + add statin per guidelines - ASA 81mg  daily + plavix 75mg  daily or alternative dual antiplatelet regimen recommended by cardiology in the setting of recent stent placement. If patient is restarted on eliquis after the acute stroke period, from a neuro standpoint there is no indication for antiplatelets in addition to eliquis (although there may be a cardiac indication for this) - well will defer to cardiology on this - q4 hr neuro checks - STAT head CT for any change in neuro exam -  Tele - PT/OT/SLP - Stroke education - Amb referral to neurology upon discharge   Stroke team will continue to follow.   This patient is critically ill and at significant risk of neurological worsening, death and care requires constant monitoring of vital signs, hemodynamics,respiratory and cardiac monitoring, neurological assessment, discussion with family, other specialists and medical decision making of high complexity. I spent 60 minutes of neurocritical care time  in the care of  this patient. This was time spent independent of any time provided by nurse practitioner or PA.  Su Monks, MD [facility] [phone]  If 7pm- 7am, please page neurology  on call as listed in [place].    ______________________________________________________________________   Thank you for the opportunity to take part in the care of this patient. If you have any further questions, please contact the neurology consultation attending.  Signed,  Su Monks, MD [facility] [phone]  If 7pm- 7am, please page neurology on call as listed in [place].

## 2021-02-15 NOTE — Telephone Encounter (Signed)
Patient calling to cancel appointment due to hospitalization. He states that his bandages are soiled with blood. He would like to know if it would be ok for the hospital to rewrap or will it need to be done by provider?   - No provider requesting consult [phone]

## 2021-02-15 NOTE — H&P (View-Only) (Signed)
Progress Note  Patient Name: John [name] Date of Encounter: 02/15/2021  [facility] Cardiologist: Shelva Majestic, MD   Subjective   Chest pain has subsided while on IV nitro. Left shoulder pain this morning.   Inpatient Medications    Scheduled Meds:  aspirin EC  81 mg Oral Daily   budesonide  2 mL Inhalation BID   cephALEXin  500 mg Oral Q8H   empagliflozin  25 mg Oral Daily   insulin aspart  0-15 Units Subcutaneous TID WC   metoprolol succinate  100 mg Oral Daily   mexiletine  300 mg Oral Q12H   montelukast  10 mg Oral Daily   rosuvastatin  20 mg Oral Daily   sacubitril-valsartan  1 tablet Oral BID   sotalol  160 mg Oral BID   Continuous Infusions:  heparin 1,250 Units/hr (02/15/21 0501)   nitroGLYCERIN 20 mcg/min (02/15/21 0503)   PRN Meds: acetaminophen, albuterol, HYDROcodone-acetaminophen, nitroGLYCERIN, ondansetron (ZOFRAN) IV   Vital Signs    Vitals:   02/15/21 0600 02/15/21 0615 02/15/21 0645 02/15/21 0653  BP: 107/75 106/69 (!) 119/91   Pulse: 69 70 73 70  Resp: 16 18 (!) 23 17  Temp:      TempSrc:      SpO2: 96% 94% 95% 95%  Weight:      Height:        Intake/Output Summary (Last 24 hours) at 02/15/2021 0716 Last data filed at 02/15/2021 0503 Gross per 24 hour  Intake 137.41 ml  Output --  Net 137.41 ml   Last 3 Weights 02/14/2021 02/10/2021 02/01/2021  Weight (lbs) 217 lb 220 lb 7.4 oz 221 lb  Weight (kg) 98.431 kg 100 kg 100.245 kg      Telemetry    Several short runs of NSVT overnight, few PVCs this morning, a-paced- Personally Reviewed  ECG    SR, a-paced Nonspecific IVCD - Personally Reviewed  Physical Exam   GEN: No acute distress.   Neck: No JVD Cardiac: RRR, no murmurs, rubs, or gallops.  Respiratory: Clear to auscultation bilaterally. GI: Soft, nontender, non-distended  MS: 1+ RLE edema, toes are wrapped Neuro:  Nonfocal  Psych: Normal affect   Labs    High Sensitivity Troponin:   Recent Labs  Lab 01/26/21 1235  01/26/21 1421 02/14/21 1727 02/14/21 1927 02/14/21 2237  TROPONINIHS 132* 124* 3,076* 2,458* 2,923*      Chemistry Recent Labs  Lab 02/08/21 1303 02/09/21 0350 02/11/21 0243 02/14/21 1727 02/15/21 0342  NA 141   < > 141 138 138  K 4.0   < > 4.0 4.2 3.8  CL 108   < > 107 108 110  CO2 26   < > 26 23 20*  GLUCOSE 218*   < > 131* 136* 132*  BUN 13   < > 14 14 11   CREATININE 1.06   < > 0.99 0.88 0.78  CALCIUM 9.2   < > 8.8* 8.5* 8.6*  PROT 6.1*  --   --   --   --   ALBUMIN 3.3*  --   --   --   --   AST 20  --   --   --   --   ALT 20  --   --   --   --   ALKPHOS 87  --   --   --   --   BILITOT 1.0  --   --   --   --   GFRNONAA >60   < > >  60 >60 >60  ANIONGAP 7   < > 8 7 8    < > = values in this interval not displayed.     Hematology Recent Labs  Lab 02/11/21 0549 02/14/21 1727 02/15/21 0342  WBC 6.5 9.8 7.9  RBC 5.27 5.12 5.10  HGB 15.7 15.7 15.5  HCT 49.7 48.1 47.4  MCV 94.3 93.9 92.9  MCH 29.8 30.7 30.4  MCHC 31.6 32.6 32.7  RDW 13.8 13.6 13.7  PLT 169 163 156    BNPNo results for input(s): BNP, PROBNP in the last 168 hours.   DDimer  Recent Labs  Lab 02/14/21 1727  DDIMER 0.66*     Radiology    DG Chest Portable 1 View  Result Date: 02/14/2021 CLINICAL DATA:  Acute chest pain EXAM: PORTABLE CHEST 1 VIEW COMPARISON:  01/26/2021, CT 07/26/2020, 01/17/2021 FINDINGS: Left-sided pacing device as before. Mild cardiomegaly without focal opacity, pleural effusion, or pneumothorax. Previously noted left midlung nodule for which chest CT was recommended is poorly visible on current exam. IMPRESSION: No active disease. Mild cardiomegaly. Previously noted left midlung nodule for which chest CT was recommended is poorly visible today likely due to technique and overlying pacer generator, CT follow-up again recommended. Electronically Signed   By: Donavan Foil M.D.   On: 02/14/2021 18:17    Cardiac Studies   Echo: 01/18/21  IMPRESSIONS     1. Left ventricular  ejection fraction, by estimation, is 40 to 45%. The  left ventricle has mildly decreased function. The left ventricle  demonstrates regional wall motion abnormalities (see scoring  diagram/findings for description). Left ventricular  diastolic parameters are consistent with Grade I diastolic dysfunction  (impaired relaxation). There is mild dyskinesis of the left ventricular,  basal inferior wall and inferolateral wall.   2. Right ventricular systolic function is normal. The right ventricular  size is normal. Tricuspid regurgitation signal is inadequate for assessing  PA pressure.   3. Left atrial size was moderately dilated.   4. Right atrial size was mildly dilated.   5. The mitral valve is normal in structure. Mild mitral valve  regurgitation.   6. The aortic valve is tricuspid. Aortic valve regurgitation is not  visualized.   7. Aortic dilatation noted. There is mild dilatation of the aortic root,  measuring 40 mm. There is mild dilatation of the ascending aorta,  measuring 41 mm.   FINDINGS   Left Ventricle: Left ventricular ejection fraction, by estimation, is 40  to 45%. The left ventricle has mildly decreased function. The left  ventricle demonstrates regional wall motion abnormalities. Mild dyskinesis  of the left ventricular, basal  inferior wall and inferolateral wall. The left ventricular internal cavity  size was normal in size. There is no left ventricular hypertrophy. Left  ventricular diastolic parameters are consistent with Grade I diastolic  dysfunction (impaired relaxation).  Normal left ventricular filling pressure.      LV Wall Scoring:  The basal inferolateral segment and basal inferior segment are dyskinetic.  The mid inferolateral segment and basal inferoseptal segment are akinetic.   Right Ventricle: The right ventricular size is normal. No increase in  right ventricular wall thickness. Right ventricular systolic function is  normal. Tricuspid  regurgitation signal is inadequate for assessing PA  pressure.   Left Atrium: Left atrial size was moderately dilated.   Right Atrium: Right atrial size was mildly dilated.   Pericardium: There is no evidence of pericardial effusion.   Mitral Valve: The mitral valve is normal in  structure. Mild mitral valve  regurgitation, with centrally-directed jet.   Tricuspid Valve: The tricuspid valve is normal in structure. Tricuspid  valve regurgitation is not demonstrated.   Aortic Valve: The aortic valve is tricuspid. Aortic valve regurgitation is  not visualized.   Pulmonic Valve: The pulmonic valve was normal in structure. Pulmonic valve  regurgitation is not visualized.   Aorta: Aortic dilatation noted. There is mild dilatation of the aortic  root, measuring 40 mm. There is mild dilatation of the ascending aorta,  measuring 41 mm.   IAS/Shunts: No atrial level shunt detected by color flow Doppler.   Additional Comments: A device lead is visualized in the right ventricle  and right atrium.   Patient Profile     70 y.o. male  with history of CAD with multiple prior PCIs dating back to 1996(fom notes prior multiple stents to RCA, priot stent to [place], HTN, OSA, aflutter, ventricular tachycardia with ICD followed by EP on beta blocker, mexilitene, and sotalol with prior  VT ablation 12/22/42, chronic systolic HF who was seen 03/08/1006 for the evaluation of chest pain.  Assessment & Plan    NSTEMI: hsTn 3076. Initially paged as a STEMI but canceled. Has been on IV heparin and nitro overnight. No further episodes of chest pain. Planned for cardiac cath this morning.  -- continue IV heparin, nitro, ASA, statin, BB, entresto  Chronic combined systolic and diastolic HF: echo 8/2 with EF of 40-45% with mild dyskinesis of the left ventricular, basal inferior wall and inferolateral wall. Does not appear volume overloaded on exam -- continue metoprolol, entresto, jardiance  -- plan to add spiro  post cath -> will be able to assess LVEDP and adjust diuretics based on results.  Hx of VT: followed by EP. Has had a few short episodes of NSVT, PVCs.  -- on sotalol and mexiletine  DM: SSI, Jardiance   HLD: LDL 51 -- on statin continue home dose as lipids are well controlled.  Paroxysmal afib: A-paced on telemetry -- last Eliquis was 9am 8/29  Osteomyelitis: recent admission and discharged on 8/26 with toe amputation -- on keflex until 9/2 -- was planned for outpatient follow up with podiatry tomorrow  For questions or updates, please contact [place] Please consult [email or web address] for contact info under        Signed, Reino Bellis, NP  02/15/2021, 7:16 AM    ATTENDING ATTESTATION  I have seen, examined and evaluated the patient this AM along with Reino Bellis, NP-C.  After reviewing all the available data and chart, we discussed the patients laboratory, study & physical findings as well as symptoms in detail. I agree with her findings, examination as well as impression recommendations as per our discussion.    Attending adjustments noted in italics.   Patient with known CAD, mild cardiomyopathy with combined systolic diastolic heart failure and PAF presenting with symptoms concerning for ACS/angina with elevated troponin => non-STEMI.  Plan cardiac catheterization today.  Catheterization was delayed due to being on DOAC.  He is now well over 24 hours from his last dose of the DOAC.Faythe Ghee for cath.   Shared Decision Making/Informed Consent{ The risks [stroke (1 in 1000), death (1 in 1000), kidney failure [usually temporary] (1 in 500), bleeding (1 in 200), allergic reaction [possibly serious] (1 in 200)], benefits (diagnostic support and management of coronary artery disease) and alternatives of a cardiac catheterization were discussed in detail with Mr. Harvie and he is willing to proceed.  Glenetta Hew, M.D., M.S. Interventional Cardiologist   Pager #  [phone] Phone # [phone] [address] [place],  [address]

## 2021-02-15 NOTE — Telephone Encounter (Signed)
Spoke with nurse.  She will change the dressing today.  May need to be rounded on later this week.  She said he would be in the hospital for a few days.  Thanks

## 2021-02-15 NOTE — Progress Notes (Signed)
Not coming back to 2c, belongings to be taken to his room in [place] by the night shift staff.

## 2021-02-15 NOTE — Interval H&P Note (Signed)
Cath Lab Visit (complete for each Cath Lab visit)  Clinical Evaluation Leading to the Procedure:   ACS: Yes.    Non-ACS:    Anginal Classification: CCS IV  Anti-ischemic medical therapy: Maximal Therapy (2 or more classes of medications)  Non-Invasive Test Results: No non-invasive testing performed  Prior CABG: No previous CABG      History and Physical Interval Note:  02/15/2021 3:26 PM  John [name]  has presented today for surgery, with the diagnosis of NSTEMI.  The various methods of treatment have been discussed with the patient and family. After consideration of risks, benefits and other options for treatment, the patient has consented to  Procedure(s): LEFT HEART CATH AND CORONARY ANGIOGRAPHY (N/A) as a surgical intervention.  The patient's history has been reviewed, patient examined, no change in status, stable for surgery.  I have reviewed the patient's chart and labs.  Questions were answered to the patient's satisfaction.     Shelva Majestic

## 2021-02-15 NOTE — Progress Notes (Signed)
Transported to the cath lab by bed. Heparin  gtt held for  cardiac cath.

## 2021-02-16 ENCOUNTER — Inpatient Hospital Stay (HOSPITAL_COMMUNITY): Payer: Medicare Other

## 2021-02-16 ENCOUNTER — Encounter: Payer: Medicare Other | Admitting: Podiatry

## 2021-02-16 ENCOUNTER — Encounter (HOSPITAL_COMMUNITY): Payer: Self-pay | Admitting: Cardiovascular Disease

## 2021-02-16 DIAGNOSIS — I5042 Chronic combined systolic (congestive) and diastolic (congestive) heart failure: Secondary | ICD-10-CM | POA: Diagnosis not present

## 2021-02-16 DIAGNOSIS — I6340 Cerebral infarction due to embolism of unspecified cerebral artery: Secondary | ICD-10-CM

## 2021-02-16 DIAGNOSIS — I480 Paroxysmal atrial fibrillation: Secondary | ICD-10-CM | POA: Diagnosis not present

## 2021-02-16 DIAGNOSIS — I25110 Atherosclerotic heart disease of native coronary artery with unstable angina pectoris: Secondary | ICD-10-CM | POA: Diagnosis not present

## 2021-02-16 DIAGNOSIS — I214 Non-ST elevation (NSTEMI) myocardial infarction: Secondary | ICD-10-CM | POA: Diagnosis not present

## 2021-02-16 LAB — AEROBIC/ANAEROBIC CULTURE W GRAM STAIN (SURGICAL/DEEP WOUND)
Culture: NO GROWTH
Gram Stain: NONE SEEN

## 2021-02-16 LAB — GLUCOSE, CAPILLARY
Glucose-Capillary: 83 mg/dL (ref 70–99)
Glucose-Capillary: 112 mg/dL — ABNORMAL HIGH (ref 70–99)
Glucose-Capillary: 122 mg/dL — ABNORMAL HIGH (ref 70–99)
Glucose-Capillary: 156 mg/dL — ABNORMAL HIGH (ref 70–99)

## 2021-02-16 LAB — BASIC METABOLIC PANEL
Anion gap: 10 (ref 5–15)
BUN: 14 mg/dL (ref 8–23)
CO2: 17 mmol/L — ABNORMAL LOW (ref 22–32)
Calcium: 8.4 mg/dL — ABNORMAL LOW (ref 8.9–10.3)
Chloride: 112 mmol/L — ABNORMAL HIGH (ref 98–111)
Creatinine, Ser: 0.84 mg/dL (ref 0.61–1.24)
GFR, Estimated: >60 mL/min (ref >60)
Glucose, Bld: 102 mg/dL — ABNORMAL HIGH (ref 70–99)
Potassium: 3.8 mmol/L (ref 3.5–5.1)
Sodium: 139 mmol/L (ref 135–145)

## 2021-02-16 LAB — CBC
HCT: 44.8 % (ref 39.0–52.0)
Hemoglobin: 14.6 g/dL (ref 13.0–17.0)
MCH: 30.4 pg (ref 26.0–34.0)
MCHC: 32.6 g/dL (ref 30.0–36.0)
MCV: 93.3 fL (ref 80.0–100.0)
Platelets: 164 10*3/uL (ref 150–400)
RBC: 4.8 MIL/uL (ref 4.22–5.81)
RDW: 13.5 % (ref 11.5–15.5)
WBC: 7.4 10*3/uL (ref 4.0–10.5)
nRBC: 0 % (ref 0.0–0.2)

## 2021-02-16 LAB — POCT I-STAT 7, (LYTES, BLD GAS, ICA,H+H)
Acid-base deficit: 5 mmol/L — ABNORMAL HIGH (ref 0.0–2.0)
Bicarbonate: 18.9 mmol/L — ABNORMAL LOW (ref 20.0–28.0)
Calcium, Ion: 1.2 mmol/L (ref 1.15–1.40)
HCT: 42 % (ref 39.0–52.0)
Hemoglobin: 14.3 g/dL (ref 13.0–17.0)
O2 Saturation: 94 %
Patient temperature: 98.4
Potassium: 3.8 mmol/L (ref 3.5–5.1)
Sodium: 142 mmol/L (ref 135–145)
TCO2: 20 mmol/L — ABNORMAL LOW (ref 22–32)
pCO2 arterial: 30.2 mmHg — ABNORMAL LOW (ref 32.0–48.0)
pH, Arterial: 7.404 (ref 7.350–7.450)
pO2, Arterial: 67 mmHg — ABNORMAL LOW (ref 83.0–108.0)

## 2021-02-16 MED ORDER — MEXILETINE HCL 150 MG PO CAPS
150.0000 mg | ORAL_CAPSULE | Freq: Two times a day (BID) | ORAL | Status: DC
  Administered 2021-02-16 – 2021-02-18 (×4): 150 mg via ORAL
  Filled 2021-02-16 (×6): qty 1

## 2021-02-16 MED ORDER — APIXABAN 2.5 MG PO TABS: 2.5000 mg | ORAL_TABLET | Freq: Two times a day (BID) | ORAL | Status: DC

## 2021-02-16 MED ORDER — APIXABAN 5 MG PO TABS
5.0000 mg | ORAL_TABLET | Freq: Two times a day (BID) | ORAL | Status: DC
  Administered 2021-02-16 – 2021-02-18 (×4): 5 mg via ORAL
  Filled 2021-02-16 (×4): qty 1

## 2021-02-16 NOTE — Evaluation (Signed)
Occupational Therapy Evaluation Patient Details Name: John [name] MRN: [id number] DOB: October 10, 1950 Today's Date: 02/16/2021    History of Present Illness 70 y.o. male with history of  history of CAD with multiple prior PCIs, prior multiple stents to RCA, prior stent to [place], HTN, OSA, aflutter, ventricular tachycardia with ICD followed by EP on beta blocker, mexilitene, and sotalol with prior VT ablation 0/9/38, chronic systolic HF admitted for cardiac catheterization.  Post procedure:Patient with right gaze preference , left hemianopia, left facial droop, left arm weakness, left leg weakness, left decreased sensation, dysarthria , and left neglect.  Per Neuro Note:Suspect right brain infarct embolic secondary to cardiac interventional procedure.  Of note: 8/2 AMPUTATION  LEFT SECOND TOE with no WB restrictions.  MRI: Acute infarct right thalamus. Small punctate areas of acute infarct  in the frontal lobes bilaterally and right parietal white matter.  Findings suggest cerebral emboli 8/31.   Clinical Impression   Patient admitted for the above procedure, post surgery with deficits noted above.  PTA he lives with his spouse, who is able to provide supportive assist 24 hours/day, but cannot provide physical assist.  He was able to complete all ADL/IADL independently, walked occasionally with a SPC outside, and did all the community mobility.  Deficits impacting independence are listed below.  Currently, he is needing up to Mod A with RW for basic mobility, and lower body ADL.  Patient declined taping to his glasses for now, wants to see how his double vision is in the morning.  OT to follow in the acute setting, but given prior level of function, and patient's motivation to regain independence, CIR is recommended post acute.      Follow Up Recommendations  CIR    Equipment Recommendations  Tub/shower seat;3 in 1 bedside commode    Recommendations for Other Services Rehab consult      Precautions / Restrictions Precautions Precautions: Fall Precaution Comments: L foot wrapped with gauze. Restrictions Weight Bearing Restrictions: No Other Position/Activity Restrictions: Patient with "ambulation in hall" order.      Mobility Bed Mobility Overal bed mobility: Needs Assistance Bed Mobility: Supine to Sit     Supine to sit: Supervision       Patient Response: Impulsive  Transfers Overall transfer level: Needs assistance Equipment used: Rolling walker (2 wheeled) Transfers: Sit to/from [facility] Sit to Stand: Min assist Stand pivot transfers: Mod assist            Balance Overall balance assessment: Needs assistance Sitting-balance support: Feet supported Sitting balance-Leahy Scale: Fair     Standing balance support: Bilateral upper extremity supported Standing balance-Leahy Scale: Poor                             ADL either performed or assessed with clinical judgement   ADL Overall ADL's : Needs assistance/impaired Eating/Feeding: Set up;Sitting   Grooming: Wash/dry hands;Min guard;Standing   Upper Body Bathing: Supervision/ safety;Sitting;Standing   Lower Body Bathing: Moderate assistance;Sit to/from stand Lower Body Bathing Details (indicate cue type and reason): poor stand dynamic balance Upper Body Dressing : Supervision/safety;Sitting   Lower Body Dressing: Moderate assistance;Sit to/from stand   Toilet Transfer: Moderate assistance;Ambulation;Regular Toilet;RW   Toileting- Clothing Manipulation and Hygiene: Supervision/safety;Sitting/lateral lean       Functional mobility during ADLs: Moderate assistance;Rolling walker General ADL Comments: decreased safety, leaning to the L, double vision and dizziness noted.     Vision Baseline Vision/History: 1  Wears glasses Ability to See in Adequate Light: 0 Adequate Patient Visual Report: Diplopia Vision Assessment?: Yes Alignment/Gaze Preference:  Within Defined Limits Tracking/Visual Pursuits: Able to track stimulus in all quads without difficulty Visual Fields: No apparent deficits Diplopia Assessment: Objects split on top of one another;Present in near gaze;Present in far gaze;Present all the time/all directions Depth Perception: Overshoots;Undershoots     Perception     Praxis      Pertinent Vitals/Pain Pain Assessment: No/denies pain     Hand Dominance Right   Extremity/Trunk Assessment Upper Extremity Assessment Upper Extremity Assessment: Overall WFL for tasks assessed (mild weakness to L shoulder flexion compared to R)   Lower Extremity Assessment Lower Extremity Assessment: Defer to PT evaluation   Cervical / Trunk Assessment Cervical / Trunk Assessment: Normal   Communication Communication Communication: No difficulties   Cognition Arousal/Alertness: Awake/alert Behavior During Therapy: WFL for tasks assessed/performed Overall Cognitive Status: Impaired/Different from baseline Area of Impairment: Memory;Following commands;Safety/judgement;Problem solving                     Memory: Decreased short-term memory Following Commands: Follows one step commands with increased time Safety/Judgement: Decreased awareness of safety;Decreased awareness of deficits   Problem Solving: Requires verbal cues     General Comments       Exercises     Shoulder Instructions      Home Living Family/patient expects to be discharged to:: Private residence Living Arrangements: Spouse/significant other Available Help at Discharge: Family;Available 24 hours/day Type of Home: House Home Access: Stairs to enter;Ramped entrance Entrance Stairs-Number of Steps: 5 STE in the front, home made ramp in the back for the dogs primarily, but he did go up and down.  Not ADA compliant. Entrance Stairs-Rails: Left Home Layout: One level     Bathroom Shower/Tub: Occupational psychologist: Standard Bathroom  Accessibility: Yes How Accessible: Accessible via walker Home Equipment: Shower seat;Grab bars - tub/shower;Hand held shower head   Additional Comments: spouse has a wheelchair      Prior Functioning/Environment Level of Independence: Independent                 OT Problem List: Decreased strength;Impaired balance (sitting and/or standing);Decreased knowledge of precautions;Decreased knowledge of use of DME or AE;Decreased safety awareness;Decreased cognition;Impaired vision/perception      OT Treatment/Interventions: Self-care/ADL training;Neuromuscular education;DME and/or AE instruction;Balance training;Patient/family education;Visual/perceptual remediation/compensation;Cognitive remediation/compensation;Therapeutic activities    OT Goals(Current goals can be found in the care plan section) Acute Rehab OT Goals Patient Stated Goal: I need to be independent OT Goal Formulation: With patient Time For Goal Achievement: 03/02/21 Potential to Achieve Goals: Good  OT Frequency: Min 2X/week   Barriers to D/C:    none noted       Co-evaluation              AM-PAC OT "6 Clicks" Daily Activity     Outcome Measure Help from another person eating meals?: None Help from another person taking care of personal grooming?: A Little Help from another person toileting, which includes using toliet, bedpan, or urinal?: A Lot Help from another person bathing (including washing, rinsing, drying)?: A Lot Help from another person to put on and taking off regular upper body clothing?: A Little Help from another person to put on and taking off regular lower body clothing?: A Lot 6 Click Score: 16   End of Session Equipment Utilized During Treatment: Gait belt;Rolling walker Nurse Communication: Mobility status  Activity  Tolerance: Patient tolerated treatment well Patient left: in chair;with call bell/phone within reach;with chair alarm set;with family/visitor present  OT Visit  Diagnosis: Unsteadiness on feet (R26.81);Muscle weakness (generalized) (M62.81);Other symptoms and signs involving cognitive function;Dizziness and giddiness (R42);Hemiplegia and hemiparesis Hemiplegia - Right/Left: Left Hemiplegia - dominant/non-dominant: Non-Dominant Hemiplegia - caused by: Cerebral infarction                Time: 8889-1694 OT Time Calculation (min): 27 min Charges:  OT General Charges $OT Visit: 1 Visit OT Evaluation $OT Eval Moderate Complexity: 1 Mod OT Treatments $Self Care/Home Management : 8-22 mins  02/16/2021  RP, OTR/L  Acute Rehabilitation Services  Office:  [phone]   Metta Clines 02/16/2021, 4:55 PM

## 2021-02-16 NOTE — Progress Notes (Addendum)
STROKE TEAM PROGRESS NOTE   INTERVAL HISTORY There is no one  at the bedside.  He appears much better than previous evaluation.  He is alert and interactive.  He has mild left-sided inattention.  He is complaining of vision difficulties and trouble moving his left eye.  CT scan of the head and CT angiogram of the brain and neck was unremarkable.  MRI scans not ordered.  Echocardiogram shows EF of 40 to 45% without definite clot.     Vitals:   02/16/21 0846 02/16/21 0900 02/16/21 1000 02/16/21 1100  BP:  138/82 133/78 (!) 144/78  Pulse:  70 68 70  Resp:  (!) 23 18 19   Temp:      TempSrc:      SpO2: 98% 95% 96% 97%  Weight:      Height:       CBC:  Recent Labs  Lab 02/11/21 0549 02/14/21 1727 02/15/21 0342 02/15/21 2124 02/16/21 0153  WBC 6.5 9.8 7.9  --  7.4  NEUTROABS 4.4 7.7  --   --   --   HGB 15.7 15.7 15.5 14.3 14.6  HCT 49.7 48.1 47.4 42.0 44.8  MCV 94.3 93.9 92.9  --  93.3  PLT 169 163 156  --  681   Basic Metabolic Panel:  Recent Labs  Lab 02/11/21 0243 02/14/21 1727 02/15/21 0342 02/15/21 2124 02/16/21 0153  NA 141   < > 138 142 139  K 4.0   < > 3.8 3.8 3.8  CL 107   < > 110  --  112*  CO2 26   < > 20*  --  17*  GLUCOSE 131*   < > 132*  --  102*  BUN 14   < > 11  --  14  CREATININE 0.99   < > 0.78  --  0.84  CALCIUM 8.8*   < > 8.6*  --  8.4*  MG 2.2  --   --   --   --    < > = values in this interval not displayed.    Lipid Panel:  Recent Labs  Lab 02/15/21 0342  CHOL 101  TRIG 80  HDL 34*  CHOLHDL 3.0  VLDL 16  LDLCALC 51    HgbA1c: 8.0 on 02/08/2021 No results for input(s): HGBA1C in the last 168 hours. Urine Drug Screen: No results for input(s): LABOPIA, COCAINSCRNUR, LABBENZ, AMPHETMU, THCU, LABBARB in the last 168 hours.  Alcohol Level No results for input(s): ETH in the last 168 hours.  IMAGING past 24 hours CARDIAC CATHETERIZATION  Result Date: 02/15/2021   1st Mrg lesion is 55% stenosed.   Prox RCA lesion is 50% stenosed.   Prox RCA  to Dist RCA lesion is 35% stenosed.   RPAV-1 lesion is 100% stenosed.   RPAV-2 lesion is 80% stenosed.   A stent was successfully placed.   Post intervention, there is a 0% residual stenosis.   Post intervention, there is a 0% residual stenosis. Acute coronary syndrome secondary to total occlusion of a large distal right coronary artery at a site of prior distal stenting. The LAD has mild irregularity without significant disease.  The large ramus intermediate vessel has previously noted 55% very proximal stenosis not significantly changed.  The AV groove circumflex is a small vessel. The RCA is a very large vessel with diffuse stents proximally to very distally.  There is proximal 50% stenosis prior to the proximal to mid stent.  There is diffuse 35%  narrowing within the long proximal stent.  The distal stent is totally occluded just after PDA vessel. Difficult but successful PCI requiring PTCA, a 3.5 x 15 mm Scorflex multiple dilatations throughout the previously placed tandem 3.5 mm  distal stents with ultimate noncompliant balloon dilatation to approximately 3.6 mm and insertion of a 2.75 x 22 mm Onyx Frontier new stent distal to the distal stent placed in overlap 75 to 80% stenosis beyond the stented segment, postdilated with a 3 oh balloon with residual narrowing of 0%. LVEDP 18 mmHg RECOMMENDATION: Initial triple drug therapy with plan discontinuance of aspirin and continuation of Plavix/for minimum of 6 to 12 months.  Near the completion of the procedure, the patient appeared slightly less responsive.  Blood sugar was obtained which was 63.  He was given one half amp of D50.  After the procedure, a subtle left facial droop developed and there appeared to be  left arm weakness..  A code stroke was activated and the patient was seen immediately by neurology and sent for head CT for further evaluation.   CT HEAD CODE STROKE WO CONTRAST  Result Date: 02/15/2021 CLINICAL DATA:  Code stroke.  Neuro deficit,  acute, stroke suspected EXAM: CT HEAD WITHOUT CONTRAST TECHNIQUE: Contiguous axial images were obtained from the base of the skull through the vertex without intravenous contrast. COMPARISON:  None. FINDINGS: Brain: No evidence of acute large vascular territory infarction, hemorrhage, hydrocephalus, extra-axial collection or mass lesion/mass effect. Intracranial enhancement is noted, likely related to recent cardiac catheterization. Vascular: Contrast within vessels, likely related to recent cardiac catheterization. Skull: No acute fracture. Sinuses/Orbits: No acute findings. Other: No mastoid effusion. ASPECTS [facility] Stroke Program Early CT Score) Total score (0-10 with 10 being normal): 10. IMPRESSION: No evidence of acute intracranial abnormality.  ASPECTS is 10. Code stroke imaging results were communicated on 02/15/2021 at 6:47 pm to provider [facility] via secure text paging. Electronically Signed   By: Margaretha Sheffield M.D.   On: 02/15/2021 18:48   CT ANGIO HEAD NECK W WO CM W PERF (CODE STROKE)  Result Date: 02/15/2021 CLINICAL DATA:  Left-sided neglect EXAM: CT ANGIOGRAPHY HEAD AND NECK TECHNIQUE: Multidetector CT imaging of the head and neck was performed using the standard protocol during bolus administration of intravenous contrast. Multiplanar CT image reconstructions and MIPs were obtained to evaluate the vascular anatomy. Carotid stenosis measurements (when applicable) are obtained utilizing NASCET criteria, using the distal internal carotid diameter as the denominator. CONTRAST:  145mL OMNIPAQUE IOHEXOL 350 MG/ML SOLN COMPARISON:  Same day noncontrast head CT, brain MRI 11/02/2014 FINDINGS: CTA NECK FINDINGS Aortic arch: There is minimal calcified atherosclerotic plaque of the aortic arch and at the origins of the great vessels. Right carotid system: No evidence of dissection, stenosis (50% or greater) or occlusion. Left carotid system: No evidence of dissection, stenosis (50% or greater) or  occlusion. Vertebral arteries: Codominant. No evidence of dissection, stenosis (50% or greater) or occlusion. Skeleton: There is multilevel degenerative change of the cervical spine. There is no acute osseous abnormality or aggressive osseous lesion. Other neck: There is a right maxillary sinus mucous retention cyst. Upper chest: There is mosaic attenuation in the lung apices with interlobular septal thickening. There is a 1.6 cm lesion in the left upper lobe, incompletely imaged. Review of the MIP images confirms the above findings CTA HEAD FINDINGS Anterior circulation: There is mild calcification of the cavernous ICAs without hemodynamically significant stenosis or occlusion. The bilateral ACAs and MCAs are patent. There is no  aneurysm. Posterior circulation: The V4 segments of the vertebral arteries are patent. The basilar artery is patent. The PCAs are patent. Venous sinuses: As permitted by contrast timing, patent. Anatomic variants: None. Review of the MIP images confirms the above findings IMPRESSION: 1. Patent vasculature of the head and neck with no hemodynamically significant stenosis, occlusion, dissection, or aneurysm. 2. Soft tissue lesion in the left upper lobe concerning for malignancy, incompletely imaged. Recommend dedicated CT chest. 3. Mild pulmonary interstitial edema and mosaic attenuation suggesting small airway disease. Electronically Signed   By: Valetta Mole M.D.   On: 02/15/2021 19:04    PHYSICAL EXAM   Physical Exam  Constitutional: Appears well-developed and well-nourished elderly Caucasian male. He is pleasant and cooperative. He has a right sided gaze preference.  Psych: Affect appropriate to situation Eyes: No scleral injection HENT: No OP obstrucion MSK: no joint deformities.  Cardiovascular: Normal rate and regular rhythm.  Respiratory: Effort normal, non-labored breathing GI: Soft.  No distension. There is no tenderness.  Skin: WDI  Neuro: Mental Status: Patient is  awake, alert, oriented to person, place, month, year, and situation.  Patient is able to give a clear and coherent history.  No signs of aphasia but he does have a mild left sided neglect   Cranial Nerves: II: Visual Fields are full. Pupils are equal, round, and reactive to light.   He has a right gaze preference, but he is able to cross midline without difficulty.  Left eye as esotropia.  He has some subjective diplopia on upgaze but no skew eye deviation or restriction of movement is noted III,IV, VI: EOMI without ptosis or diplopia.  V: Facial sensation is symmetric to temperature VII: Mild left lower facial asymmetry when he smiles.  VIII: Hearing is intact to voice X: Palate elevates symmetrically XI: Shoulder shrug is symmetric. XII: Tongue protrudes midline without atrophy or fasciculations.  Motor: Tone is normal. Bulk is normal. 5/5 strength was present in all four extremities.  Mild weakness of left grip and intrinsic hand muscles.  Diminished fine finger movements on the left.  Orbits right over left upper extremity. Sensory: Sensation is symmetric to light touch and temperature in the arms and legs.  Mild extinction to DSS present on the left.     Plantars: Toes are downgoing bilaterally.   Cerebellar: FNF and HKS are intact bilaterally         ASSESSMENT/PLAN Mr. GWEN EDLER is a 70 y.o. male with history of  history of CAD with multiple prior PCIs dating back to 1996(fom notes prior multiple stents to RCA, prior stent to [facility], HTN, OSA, aflutter, ventricular tachycardia with ICD followed by EP on beta blocker, mexilitene, and sotalol with prior  VT ablation 07/28/49, chronic systolic HF admitted for cardiac catheterization. He is on eliquis, last dose was the day before admission. He had multiple coronary stents placed today during the procedure he had a large volume of heparin and 600mg  plavix. After the procedure while he was still on the table he developed L facial  droop and dense L-sided hemineglect. Stroke code was called. NIHSS = 12.   Head CT showed NAICP. Given recent use of multiple anticoagulants patient was not a candidate for TNK. CTA showed no LVO therefore intervention was not indicated.   Suspect right brain   infarct embolic secondary to cardiac interventional procedure. MRI brain pending.  Code Stroke  CT head No acute abnormality.  Small vessel disease. Atrophy.  ASPECTS 10.  CTA head & neck : no hemodynamically significant stenosis, occlusion, dissection or aneurysm. Thre is a 1.6 cm lesion at left upper lobe.  MRI  pending 2D Echo : LVEF 40-44%, no LVH, dilated LA, no IA shunt    LDL 51 HgbA1c 8.0 VTE prophylaxis -  scd    Diet   Diet Carb Modified Fluid consistency: Thin; Room service appropriate? Yes   Eliquis (apixaban) daily prior to admission, now will start aspirin 81 mg daily, clopidogrel 75 mg daily, and Eliquis (apixaban) daily, for 1 month then continue with aspirin and eliquis only. This is pending repeat imaging showing no hemorrhage and cardiology clearance. Therapy recommendations:  n/a Disposition:  home  Hypertension Home meds:   entresto 24/26mg  , metoprolol 100mg  xl, betapace  Stable Permissive hypertension (OK if < 220/120) but gradually normalize in 5-7 days Long-term BP goal normotensive  Hyperlipidemia Home meds:  crestor 20mg  ,  resumed in hospital LDL 51, goal < 70 Continue statin at discharge   Diabetes type II Uncontrolled Home meds:  jardiance 25mg  daily levimir insulin  HgbA1c 8.0, goal < 7.0 CBGs Recent Labs    02/15/21 1811 02/15/21 2136 02/16/21 0658  GLUCAP 108* 82 83    SSI  Other Stroke Risk Factors  Advanced Age >/= 48      Obesity, Body mass index is 29.43 kg/m., BMI >/= 30 associated with increased stroke risk, recommend weight loss, diet and exercise as appropriate   Family hx stroke   Coronary artery disease  Chronic combined systolic and diastolic HF.   Obstructive  sleep apnea,  on CPAP at home   Other Active Problems Nstemi: hsTn 3076.  ->  Unfortunately,-did not meet criteria for initially paged as a STEMI but canceled.  Cardiac cath yesterday showed completed and was included RPA V/PL system with a stent restenosis/thrombosis treated with angioplasty and second stent placement. Continue beta-blocker Entresto Plan at baseline.  Being back on Eliquis especially with having periprocedural stroke is restart Eliquis after MRI. We will plan for 1 month triple therapy with aspirin-Plavix-Eliquis, --> Stop aspirin after 1 month at the 99 therapy for at least 6 months if not longer based on him having in-stent restenosis.  DM: on jardiance. Paroxysmal Afib: a paced on tlemetry.  Osteomyelitis: on keflex until 9/2.   Hospital day # 2  I have personally obtained history,examined this patient, reviewed notes, independently viewed imaging studies, participated in medical decision making and plan of care.ROS completed by me personally and pertinent positives fully documented  I have made any additions or clarifications directly to the above note. Agree with note above.  Patient developed left hemiplegia and neglect following percutaneous cardiac intervention procedure yesterday and likely had periprocedural cerebral embolization.  CT scan is unremarkable and MRI is pending.  Patient seems to be showing neurological improvement.  Recommend aspirin and Plavix and Eliquis for a month followed by aspirin and Eliquis thereafter.  Aggressive risk factor modification.  Discussed with Dr. Ellyn Hack cardiology.  No family available at the bedside for discussion. Greater than 50% time during visit 35-minute visit was spent on counseling and coordination of care about his embolic strokes discussion about stroke prevention and treatment and answering questions. Antony Contras, MD Medical Director [place] Pager: [phone] 02/16/2021 12:34 PM     To contact  Stroke Continuity provider, please refer to [email or web address] After hours, contact General Neurology

## 2021-02-16 NOTE — Progress Notes (Signed)
Progress Note  Patient Name: John [name] Date of Encounter: 02/16/2021  [facility] Cardiologist: Shelva Majestic, MD    Patient Profile     70 y.o. male  with history of CAD with multiple prior PCIs dating back to 1996(fom notes prior multiple stents to RCA, priot stent to LCx, HTN, OSA, aflutter, ventricular tachycardia with ICD followed by EP on beta blocker, mexilitene, and sotalol with prior  VT ablation 12/19/07, chronic systolic HF who was seen 4/70/9628 for the evaluation of chest pain. Underwent cardiac catheterization on 02/16/2021 PTCA/PCI of in-stent thrombosis/occlusion in the proximal PL-complicated with periprocedural CVA.  Subjective   Resting comfortably in bed now with no chest pain or dyspnea.  He had significant confusion upon awakening this morning.  He has basically 14 to 18 hours of amnesia, the end of the procedure until he woke up.  He was not aware of going down for CT scans.  He does not recall if he may speak to about stroke.  Thankfully, he is now able to move his right arm and leg, the main thing he notes is vision difficulty using both eyes.  Difficulty focusing with 1 high, 6 okay with closing 1 eye  Inpatient Medications    Scheduled Meds:  aspirin EC  81 mg Oral Daily   budesonide  2 mL Inhalation BID   cephALEXin  500 mg Oral Q8H   Chlorhexidine Gluconate Cloth  6 each Topical Daily   clopidogrel  75 mg Oral Q breakfast   empagliflozin  25 mg Oral Daily   insulin aspart  0-15 Units Subcutaneous TID WC   metoprolol succinate  100 mg Oral Daily   mexiletine  300 mg Oral Q12H   montelukast  10 mg Oral Daily   rosuvastatin  40 mg Oral Daily   sacubitril-valsartan  1 tablet Oral BID   sodium chloride flush  3 mL Intravenous Q12H   sotalol  160 mg Oral BID   Continuous Infusions:  sodium chloride 10 mL/hr at 02/16/21 1000   nitroGLYCERIN Stopped (02/16/21 0911)   PRN Meds: sodium chloride, acetaminophen, albuterol, HYDROcodone-acetaminophen,  nitroGLYCERIN, ondansetron (ZOFRAN) IV, sodium chloride flush   Vital Signs    Vitals:   02/16/21 0815 02/16/21 0846 02/16/21 0900 02/16/21 1000  BP: (!) 132/119  138/82 133/78  Pulse: 71  70 68  Resp: (!) 22  (!) 23 18  Temp:      TempSrc:      SpO2: 97% 98% 95% 96%  Weight:      Height:        Intake/Output Summary (Last 24 hours) at 02/16/2021 1023 Last data filed at 02/16/2021 1000 Gross per 24 hour  Intake 1353.99 ml  Output 1500 ml  Net -146.01 ml   Last 3 Weights 02/14/2021 02/10/2021 02/01/2021  Weight (lbs) 217 lb 220 lb 7.4 oz 221 lb  Weight (kg) 98.431 kg 100 kg 100.245 kg      Telemetry    Mostly atrial paced rhythm with several short runs of 3-4 beats PVCs.  Personally Reviewed  ECG    A paced with prolonged AV conduction, QTC read as 509, appears to be stable..- Personally Reviewed  Physical Exam   GEN: No acute distress.  Mostly notes some confusion and amnesia from the time of being told that Dr. Claiborne Billings was placing a stent Neck: No JVD or bruit. Cardiac: RRR, normal S1 and S2 with no obvious M/R/G. [name]  Respiratory: CTA B, nonlabored, good air movement. GI:  soft/ND/NT/NABS. MS: Trivial right pedal edema with foot and toes wrapped. Neuro: Slight lateral gaze abnormality with unclear vision using both eyes; 4 out of 5 strength on the left hand versus 5 out of 5 in the right with some mild neglect with bilateral palpation-notably improved based on reported exam from yesterday. Psych: Understandably frustrated and upset, but happy with progression.  Just upset about amnesia.  He understands that yesterday's events are unknown complication of cath.  Labs    High Sensitivity Troponin:   Recent Labs  Lab 01/26/21 1235 01/26/21 1421 02/14/21 1727 02/14/21 1927 02/14/21 2237  TROPONINIHS 132* 124* 3,076* 2,458* 2,923*      Chemistry Recent Labs  Lab 02/14/21 1727 02/15/21 0342 02/15/21 2124 02/16/21 0153  NA 138 138 142 139  K 4.2 3.8 3.8 3.8  CL  108 110  --  112*  CO2 23 20*  --  17*  GLUCOSE 136* 132*  --  102*  BUN 14 11  --  14  CREATININE 0.88 0.78  --  0.84  CALCIUM 8.5* 8.6*  --  8.4*  GFRNONAA >60 >60  --  >60  ANIONGAP 7 8  --  10     Hematology Recent Labs  Lab 02/14/21 1727 02/15/21 0342 02/15/21 2124 02/16/21 0153  WBC 9.8 7.9  --  7.4  RBC 5.12 5.10  --  4.80  HGB 15.7 15.5 14.3 14.6  HCT 48.1 47.4 42.0 44.8  MCV 93.9 92.9  --  93.3  MCH 30.7 30.4  --  30.4  MCHC 32.6 32.7  --  32.6  RDW 13.6 13.7  --  13.5  PLT 163 156  --  164    BNPNo results for input(s): BNP, PROBNP in the last 168 hours.   DDimer  Recent Labs  Lab 02/14/21 1727  DDIMER 0.66*     Radiology    CARDIAC CATHETERIZATION  Result Date: 02/15/2021   1st Mrg lesion is 55% stenosed.   Prox RCA lesion is 50% stenosed.   Prox RCA to Dist RCA lesion is 35% stenosed.   RPAV-1 lesion is 100% stenosed.   RPAV-2 lesion is 80% stenosed.   A stent was successfully placed.   Post intervention, there is a 0% residual stenosis.   Post intervention, there is a 0% residual stenosis. Acute coronary syndrome secondary to total occlusion of a large distal right coronary artery at a site of prior distal stenting. The LAD has mild irregularity without significant disease.  The large ramus intermediate vessel has previously noted 55% very proximal stenosis not significantly changed.  The AV groove circumflex is a small vessel. The RCA is a very large vessel with diffuse stents proximally to very distally.  There is proximal 50% stenosis prior to the proximal to mid stent.  There is diffuse 35% narrowing within the long proximal stent.  The distal stent is totally occluded just after PDA vessel. Difficult but successful PCI requiring PTCA, a 3.5 x 15 mm Scorflex multiple dilatations throughout the previously placed tandem 3.5 mm  distal stents with ultimate noncompliant balloon dilatation to approximately 3.6 mm and insertion of a 2.75 x 22 mm Onyx Frontier new  stent distal to the distal stent placed in overlap 75 to 80% stenosis beyond the stented segment, postdilated with a 3 oh balloon with residual narrowing of 0%. LVEDP 18 mmHg RECOMMENDATION: Initial triple drug therapy with plan discontinuance of aspirin and continuation of Plavix/for minimum of 6 to 12 months.  Near the  completion of the procedure, the patient appeared slightly less responsive.  Blood sugar was obtained which was 63.  He was given one half amp of D50.  After the procedure, a subtle left facial droop developed and there appeared to be  left arm weakness..  A code stroke was activated and the patient was seen immediately by neurology and sent for head CT for further evaluation.   DG Chest Portable 1 View  Result Date: 02/14/2021 CLINICAL DATA:  Acute chest pain EXAM: PORTABLE CHEST 1 VIEW COMPARISON:  01/26/2021, CT 07/26/2020, 01/17/2021 FINDINGS: Left-sided pacing device as before. Mild cardiomegaly without focal opacity, pleural effusion, or pneumothorax. Previously noted left midlung nodule for which chest CT was recommended is poorly visible on current exam. IMPRESSION: No active disease. Mild cardiomegaly. Previously noted left midlung nodule for which chest CT was recommended is poorly visible today likely due to technique and overlying pacer generator, CT follow-up again recommended. Electronically Signed   By: Donavan Foil M.D.   On: 02/14/2021 18:17   ECHOCARDIOGRAM COMPLETE  Result Date: 02/15/2021    ECHOCARDIOGRAM REPORT   Patient Name:   John [name] Date of Exam: 02/15/2021 Medical Rec #:  [id number]        Height:       72.0 in Accession #:    [id number]       Weight:       217.0 lb Date of Birth:  [birth year]        BSA:          2.206 m Patient Age:    58 years         BP:           119/91 mmHg Patient Gender: M                HR:           72 bpm. Exam Location:  Inpatient Procedure: 2D Echo, Cardiac Doppler and Color Doppler Indications:    chest pain  History:         Patient has prior history of Echocardiogram examinations, most                 recent 01/18/2021. CAD and Previous Myocardial Infarction,                 Arrythmias:Atrial Flutter, Signs/Symptoms:Shortness of Breath;                 Risk Factors:Diabetes, Dyslipidemia and Hypertension. 02/27/2012                 left heart cath                 07/28/2016 cardionversion                 03/02/2020 left heasrt cath                 08/19/20 left heart cath.  Sonographer:    Luisa Hart RDCS Referring Phys: [id number] [place]  1. Left ventricular ejection fraction, by estimation, is 40 to 45%. The left ventricle has mildly decreased function. The left ventricle demonstrates regional wall motion abnormalities (see scoring diagram/findings for description). The left ventricular  internal cavity size was moderately dilated. Left ventricular diastolic parameters are consistent with Grade I diastolic dysfunction (impaired relaxation).  2. Right ventricular systolic function is normal. The right ventricular size is normal. There is normal pulmonary artery systolic pressure.  3. Left atrial  size was mildly dilated.  4. The mitral valve is normal in structure. Trivial mitral valve regurgitation. No evidence of mitral stenosis.  5. The aortic valve is normal in structure. Aortic valve regurgitation is not visualized.  6. Aortic dilatation noted. There is borderline dilatation of the aortic root, measuring 38 mm. Comparison(s): Prior images reviewed side by side. The left ventricular function is unchanged. The left ventricular wall motion abnormalities are worse, although the general distribution is similar. There seems to be more some worsening of contractility in the more distal portion of the inferior and inferoseptal walls, with sparing of the true apex and without meaningful change in overall LVEF. FINDINGS  Left Ventricle: Left ventricular ejection fraction, by estimation, is 40 to 45%. The left ventricle has  mildly decreased function. The left ventricle demonstrates regional wall motion abnormalities. The left ventricular internal cavity size was moderately dilated. There is no left ventricular hypertrophy. Left ventricular diastolic parameters are consistent with Grade I diastolic dysfunction (impaired relaxation). Normal left ventricular filling pressure.  LV Wall Scoring: The basal inferolateral segment and basal inferior segment are dyskinetic. The mid and distal inferior wall, mid inferolateral segment, and basal inferoseptal segment are akinetic. The mid inferoseptal segment is hypokinetic. The entire anterior wall, antero-lateral wall, entire anterior septum, apical lateral segment, and apex are normal. Right Ventricle: The right ventricular size is normal. No increase in right ventricular wall thickness. Right ventricular systolic function is normal. There is normal pulmonary artery systolic pressure. The tricuspid regurgitant velocity is 1.97 m/s, and  with an assumed right atrial pressure of 3 mmHg, the estimated right ventricular systolic pressure is 72.5 mmHg. Left Atrium: Left atrial size was mildly dilated. Right Atrium: Right atrial size was normal in size. Pericardium: There is no evidence of pericardial effusion. Mitral Valve: The mitral valve is normal in structure. Trivial mitral valve regurgitation. No evidence of mitral valve stenosis. Tricuspid Valve: The tricuspid valve is normal in structure. Tricuspid valve regurgitation is trivial. Aortic Valve: The aortic valve is normal in structure. Aortic valve regurgitation is not visualized. Aortic valve mean gradient measures 3.0 mmHg. Aortic valve peak gradient measures 4.5 mmHg. Aortic valve area, by VTI measures 5.19 cm. Pulmonic Valve: The pulmonic valve was normal in structure. Pulmonic valve regurgitation is not visualized. Aorta: Aortic dilatation noted. There is borderline dilatation of the aortic root, measuring 38 mm. IAS/Shunts: No atrial  level shunt detected by color flow Doppler. Additional Comments: A device lead is visualized in the right ventricle.  LEFT VENTRICLE PLAX 2D LVIDd:         6.20 cm      Diastology LVIDs:         5.70 cm      LV e' medial:    4.03 cm/s LV PW:         0.80 cm      LV E/e' medial:  13.5 LV IVS:        1.00 cm      LV e' lateral:   4.38 cm/s LVOT diam:     2.80 cm      LV E/e' lateral: 12.4 LV SV:         106 LV SV Index:   48 LVOT Area:     6.16 cm  LV Volumes (MOD) LV vol d, MOD A2C: 112.0 ml LV vol d, MOD A4C: 142.0 ml LV vol s, MOD A2C: 75.1 ml LV vol s, MOD A4C: 71.1 ml LV SV MOD A2C:  36.9 ml LV SV MOD A4C:     142.0 ml LV SV MOD BP:      53.3 ml RIGHT VENTRICLE RV Basal diam:  2.60 cm RV Mid diam:    1.30 cm RV S prime:     11.00 cm/s TAPSE (M-mode): 1.7 cm LEFT ATRIUM             Index       RIGHT ATRIUM           Index LA diam:        3.80 cm 1.72 cm/m  RA Area:     16.00 cm LA Vol (A2C):   43.4 ml 19.68 ml/m RA Volume:   39.80 ml  18.04 ml/m LA Vol (A4C):   52.6 ml 23.85 ml/m LA Biplane Vol: 47.5 ml 21.53 ml/m  AORTIC VALVE                   PULMONIC VALVE AV Area (Vmax):    5.23 cm    PV Vmax:       0.79 m/s AV Area (Vmean):   4.94 cm    PV Vmean:      57.700 cm/s AV Area (VTI):     5.19 cm    PV VTI:        0.148 m AV Vmax:           106.00 cm/s PV Peak grad:  2.5 mmHg AV Vmean:          77.500 cm/s PV Mean grad:  2.0 mmHg AV VTI:            0.204 m AV Peak Grad:      4.5 mmHg AV Mean Grad:      3.0 mmHg LVOT Vmax:         90.10 cm/s LVOT Vmean:        62.200 cm/s LVOT VTI:          0.172 m LVOT/AV VTI ratio: 0.84  AORTA Ao Root diam: 3.80 cm MITRAL VALVE               TRICUSPID VALVE MV Area (PHT): 2.53 cm    TR Peak grad:   15.5 mmHg MV Decel Time: 300 msec    TR Vmax:        197.00 cm/s MV E velocity: 54.40 cm/s MV A velocity: 82.70 cm/s  SHUNTS MV E/A ratio:  0.66        Systemic VTI:  0.17 m                            Systemic Diam: 2.80 cm Dani Gobble Croitoru MD Electronically signed by Sanda Klein MD Signature Date/Time: 02/15/2021/10:55:04 AM    Final    CT HEAD CODE STROKE WO CONTRAST  Result Date: 02/15/2021 CLINICAL DATA:  Code stroke.  Neuro deficit, acute, stroke suspected EXAM: CT HEAD WITHOUT CONTRAST TECHNIQUE: Contiguous axial images were obtained from the base of the skull through the vertex without intravenous contrast. COMPARISON:  None. FINDINGS: Brain: No evidence of acute large vascular territory infarction, hemorrhage, hydrocephalus, extra-axial collection or mass lesion/mass effect. Intracranial enhancement is noted, likely related to recent cardiac catheterization. Vascular: Contrast within vessels, likely related to recent cardiac catheterization. Skull: No acute fracture. Sinuses/Orbits: No acute findings. Other: No mastoid effusion. ASPECTS [facility] Stroke Program Early CT Score) Total score (0-10 with 10 being normal): 10. IMPRESSION: No evidence of acute intracranial  abnormality.  ASPECTS is 10. Code stroke imaging results were communicated on 02/15/2021 at 6:47 pm to provider [facility] via secure text paging. Electronically Signed   By: Margaretha Sheffield M.D.   On: 02/15/2021 18:48   CT ANGIO HEAD NECK W WO CM W PERF (CODE STROKE)  Result Date: 02/15/2021 CLINICAL DATA:  Left-sided neglect EXAM: CT ANGIOGRAPHY HEAD AND NECK TECHNIQUE: Multidetector CT imaging of the head and neck was performed using the standard protocol during bolus administration of intravenous contrast. Multiplanar CT image reconstructions and MIPs were obtained to evaluate the vascular anatomy. Carotid stenosis measurements (when applicable) are obtained utilizing NASCET criteria, using the distal internal carotid diameter as the denominator. CONTRAST:  122mL OMNIPAQUE IOHEXOL 350 MG/ML SOLN COMPARISON:  Same day noncontrast head CT, brain MRI 11/02/2014 FINDINGS: CTA NECK FINDINGS Aortic arch: There is minimal calcified atherosclerotic plaque of the aortic arch and at the origins of the great vessels.  Right carotid system: No evidence of dissection, stenosis (50% or greater) or occlusion. Left carotid system: No evidence of dissection, stenosis (50% or greater) or occlusion. Vertebral arteries: Codominant. No evidence of dissection, stenosis (50% or greater) or occlusion. Skeleton: There is multilevel degenerative change of the cervical spine. There is no acute osseous abnormality or aggressive osseous lesion. Other neck: There is a right maxillary sinus mucous retention cyst. Upper chest: There is mosaic attenuation in the lung apices with interlobular septal thickening. There is a 1.6 cm lesion in the left upper lobe, incompletely imaged. Review of the MIP images confirms the above findings CTA HEAD FINDINGS Anterior circulation: There is mild calcification of the cavernous ICAs without hemodynamically significant stenosis or occlusion. The bilateral ACAs and MCAs are patent. There is no aneurysm. Posterior circulation: The V4 segments of the vertebral arteries are patent. The basilar artery is patent. The PCAs are patent. Venous sinuses: As permitted by contrast timing, patent. Anatomic variants: None. Review of the MIP images confirms the above findings IMPRESSION: 1. Patent vasculature of the head and neck with no hemodynamically significant stenosis, occlusion, dissection, or aneurysm. 2. Soft tissue lesion in the left upper lobe concerning for malignancy, incompletely imaged. Recommend dedicated CT chest. 3. Mild pulmonary interstitial edema and mosaic attenuation suggesting small airway disease. Electronically Signed   By: Valetta Mole M.D.   On: 02/15/2021 19:04    Cardiac Studies   Echo: 01/18/21: EF 40 to 45%.  Mild dysfunction.  Mild dyskinesis of the left ventricular basal inferior and inferolateral wall.  Unable to assess PA pressure.  Moderate LA dilation.  Mild RA dilation.  Mild MR.  Aortic root 40 mm Ascending aorta 41 mm.  Pacemaker lead noted in the right ventricle. Echo 02/15/2021: EF 40 to  45%.  Mildly decreased function in.  Impaired laxation.  Moderate LA dilation.  Slightly worsened inferior inferolateral hypokinesis.  Mild LA dilation.  Borderline aortic root dilation at 2mm Cardiac Cath-PCI 02/15/2021: ACS secondary to total occlusion of large distal RCA-PAV at previous stented segment followed by 80% stenosis distal to stent. (Difficult for successful PTCA/PCI using 3.5 mm ScoreFlex balloon multiple inflations with post dilation of the previously stented) overlapping Onyx Frontier DES 2.75 mm x 22 mm placed overlapping distally); prox RCA ~50%, prox-distal RCA 35%.  Initial triple drug therapy with plan discontinuance of aspirin and continuation of Plavix/for minimum of 6 to 12 months.   Near the completion of the procedure, the patient appeared slightly less responsive.  Blood sugar was obtained which was 63.  He  was given one half amp of D50.  After the procedure, a subtle left facial droop developed and there appeared to be  left arm weakness..  A code stroke was activated and the patient was seen immediately by neurology and sent for head CT for further evaluation.  Assessment & Plan    NSTEMI: hsTn 3076.  ->  Unfortunately,-did not meet criteria for initially paged as a STEMI but canceled.  Cardiac cath yesterday showed completed and was included RPA V/PL system with a stent restenosis/thrombosis treated with angioplasty and second stent placement. Continue beta-blocker Entresto Plan at baseline.  Being back on Eliquis especially with having periprocedural stroke is restart Eliquis after MRI. We will plan for 1 month triple therapy with aspirin-Plavix-Eliquis, --> Stop aspirin after 1 month at the 99 therapy for at least 6 months if not longer based on him having in-stent restenosis.  Periprocedural CVA: Code stroke called after cath yesterday left hemineglect and hemiparesis and left gaze.  Thankfully, CTA of the head did not show any significant stenoses or occlusion. On  exam today, he is remarkably better.  Has minimal neglect on the left side minimally reduced weakness Still has some left-sided gaze concerns.  Discussed with neurology.  Suspect that some of the visual symptoms are related to failure to compensate with lateral gaze.  Suggested possibly wearing intermittent eye patch. Will check brain MRI.  Will confer with  [employer] Rep reference management of pacemaker for MRI. PT/ OT consulted, ambulate in hall.  Chronic combined systolic and diastolic HF: echo 8/2 with EF of 40-45% with mild dyskinesis of the left ventricular, basal inferior wall and inferolateral wall.  Appears euvolemic on exam  continue home dose metoprolol, Jardiance. Once BP is stable, would like to try to add prior to discharge.  Hx of VT: followed by EP.  Short bursts of 3-4 beats of PVCs/NSVT but no prolonged episodes. He is on combination of sotalol and mexiletine -says he is only taking 150 mg of mexiletine elevated for change 20s Taking.   Monitor QT interval-current EKG says it is 510, but likely taking into consideration some paced beats..   DM: Continue SSI and Jardiance.  HLD: LDL 51 ->  -- on statin continue home dose as lipids are well controlled well-controlled on home meds.  Continue home dose statin.  Paroxysmal afib: A-paced on telemetry -- last Eliquis was 9am 8/29 -> would like to restart Eliquis prior to discharge pending results  of brain MRI  Osteomyelitis: recent admission and discharged on 8/26 with toe amputation -- on keflex until 9/2 Telephone visit with Dr. Amalia Hailey from podiatry yesterday.   Dispo: PT / OT consulted following MRI need to assess for progression.  Dyspnea transferring to telemetry today.  May be ready for discharge in the next day or 2.   For questions or updates, please contact [place] Please consult [email or web address] for contact info under        Signed, Glenetta Hew, MD  Glenetta Hew, M.D., M.S. 02/16/2021, 10:23 AM      Interventional Cardiologist   Pager # [phone] Phone # [phone] [place], [place] [address]

## 2021-02-16 NOTE — Progress Notes (Signed)
Per order, Changed device settings for MRI to  DOO at 90 bpm  MRI mode/Tachy-therapies to off  Will program device back to pre-MRI settings after completion of exam, and send transmission

## 2021-02-16 NOTE — Consult Note (Signed)
[facility] Nurse Consult Note: Reason for Consult:Patient with left foot, 2nd digit amputation on 02/10/21. Patient to miss appointment today with Dr. Amalia Hailey (Podiatric Medicine). He was notified by patient and will plan to round on him later this week. I will provide conservative care orders for daily care to support wound healing. Wound type:Full thickness Pressure Injury POA: N/A Measurement:N/A Wound RQS:XQKSKSH approximated with sutures Drainage (amount, consistency, odor) serosanguinous to serous Periwound: expected edema Dressing procedure/placement/frequency:I will provide conservative guidance for Nursing in the post op wound care of this wound using NS cleansings and topical care using xeroform gauze, dry gauze and securement with Kerlix roll gauze. Protection of foot, elevation of heel will be accomplished with a pressure redistribution heel boot (Prevalon).  [facility] nursing team will not follow, but will remain available to this patient, the nursing and medical teams.  Please re-consult if needed. Thanks, Maudie Flakes, MSN, RN, [place], Arther Abbott  Pager# [phone]

## 2021-02-17 ENCOUNTER — Telehealth: Payer: Self-pay | Admitting: Pulmonary Disease

## 2021-02-17 DIAGNOSIS — I639 Cerebral infarction, unspecified: Secondary | ICD-10-CM

## 2021-02-17 DIAGNOSIS — R911 Solitary pulmonary nodule: Secondary | ICD-10-CM | POA: Diagnosis not present

## 2021-02-17 LAB — GLUCOSE, CAPILLARY
Glucose-Capillary: 112 mg/dL — ABNORMAL HIGH (ref 70–99)
Glucose-Capillary: 112 mg/dL — ABNORMAL HIGH (ref 70–99)
Glucose-Capillary: 178 mg/dL — ABNORMAL HIGH (ref 70–99)
Glucose-Capillary: 125 mg/dL — ABNORMAL HIGH (ref 70–99)

## 2021-02-17 LAB — HEMOGLOBIN A1C
Hgb A1c MFr Bld: 7.6 % — ABNORMAL HIGH (ref 4.8–5.6)
Mean Plasma Glucose: 171.42 mg/dL

## 2021-02-17 NOTE — Consult Note (Signed)
   [facility] CM Inpatient Consult   02/17/2021  John [name] 12/11/1950 [id number]  [place] [facility] [ACO] Patient:  Medicare CMS DCE  Primary Care Provider:  [name], MD, [place] is an Embedded provider with a Chronic Care Management program and team. Also, the provider is listed for the Transition of Care follow up   Readmission review  Patient screened for less than 7 days readmission hospitalization with noted extreme high risk score for unplanned readmission risk.  Review of patient's medical record reveals patient is post ICU stay for disposition/Transition Of Care needs. Patient with ongoing medical workup and needs are to be determined for TOC.  Plan:  Continue to follow progress and disposition to assess for post hospital care management needs.    For questions contact:   Natividad Brood, RN BSN [facility] Liaison  [phone] business mobile phone Toll free office [phone]  Fax number: [phone] [email or web address]

## 2021-02-17 NOTE — Telephone Encounter (Signed)
Patient has been scheduled for follow up per Dr. Erin Fulling. Nothing further needed. Routing to providers as FYI ONLY.  Next Appt With Pulmonology Octavio Graves Icard, DO)03/30/2021 at  3:30 PM

## 2021-02-17 NOTE — Progress Notes (Signed)
Inpatient Rehab Admissions Coordinator Note:   Per therapy patient was screened for CIR candidacy by Michel Santee, PT. At this time, pt appears to be a potential candidate for CIR. I will place an order for rehab consult for full assessment, per our protocol.  Please contact me any with questions.Shann Medal, PT, DPT [phone] 02/17/21 4:19 PM

## 2021-02-17 NOTE — Progress Notes (Signed)
Pt awaiting PT eval. Gave pt and family MI book and stent card, brief ed. Pt is well-versed on CAD but eager to work with PT. Will f/u for ambulation after eval. [place] CES, ACSM 1:45 PM 02/17/2021

## 2021-02-17 NOTE — Progress Notes (Addendum)
Progress Note  Patient Name: John [name] Date of Encounter: 02/17/2021  [facility] Cardiologist: Shelva Majestic, MD   Subjective   No chest pain, reports only deficit neurological remaining is vision in left eye. Still does not recall events post cath.   Inpatient Medications    Scheduled Meds:  apixaban  5 mg Oral BID   aspirin EC  81 mg Oral Daily   budesonide  2 mL Inhalation BID   cephALEXin  500 mg Oral Q8H   Chlorhexidine Gluconate Cloth  6 each Topical Daily   clopidogrel  75 mg Oral Q breakfast   empagliflozin  25 mg Oral Daily   insulin aspart  0-15 Units Subcutaneous TID WC   metoprolol succinate  100 mg Oral Daily   mexiletine  150 mg Oral Q12H   montelukast  10 mg Oral Daily   rosuvastatin  40 mg Oral Daily   sacubitril-valsartan  1 tablet Oral BID   sodium chloride flush  3 mL Intravenous Q12H   sotalol  160 mg Oral BID   Continuous Infusions:  sodium chloride Stopped (02/16/21 1153)   nitroGLYCERIN Stopped (02/16/21 0911)   PRN Meds: sodium chloride, acetaminophen, albuterol, HYDROcodone-acetaminophen, nitroGLYCERIN, ondansetron (ZOFRAN) IV, sodium chloride flush   Vital Signs    Vitals:   02/16/21 2340 02/17/21 0133 02/17/21 0432 02/17/21 1223  BP:  132/71 100/73 120/74  Pulse:  69 69 69  Resp:  17 18 16   Temp:  98.4 F (36.9 C) 97.6 F (36.4 C) 98 F (36.7 C)  TempSrc:  Oral Oral Oral  SpO2: 94% 97% 95% 98%  Weight:  96.2 kg    Height:        Intake/Output Summary (Last 24 hours) at 02/17/2021 1243 Last data filed at 02/17/2021 0859 Gross per 24 hour  Intake 248.85 ml  Output 725 ml  Net -476.15 ml   Last 3 Weights 02/17/2021 02/14/2021 02/10/2021  Weight (lbs) 212 lb 217 lb 220 lb 7.4 oz  Weight (kg) 96.163 kg 98.431 kg 100 kg      Telemetry    A paced, PVCs - Personally Reviewed  ECG    No new tracing this morning  Physical Exam   GEN: No acute distress.   Neck: No JVD Cardiac: RRR, no murmurs, rubs, or gallops.   Respiratory: Clear to auscultation bilaterally. Non-labored.  Good Air movement.  GI: Soft, nontender, non-distended  MS: No edema; No deformity. Right radial cath site stable. Left foot wrapped Neuro: strength notably improved on R side as is sensation.  Still with vision issues.   Psych: Normal affect  -surprisingly good mood.   Labs    High Sensitivity Troponin:   Recent Labs  Lab 01/26/21 1235 01/26/21 1421 02/14/21 1727 02/14/21 1927 02/14/21 2237  TROPONINIHS 132* 124* 3,076* 2,458* 2,923*      Chemistry Recent Labs  Lab 02/14/21 1727 02/15/21 0342 02/15/21 2124 02/16/21 0153  NA 138 138 142 139  K 4.2 3.8 3.8 3.8  CL 108 110  --  112*  CO2 23 20*  --  17*  GLUCOSE 136* 132*  --  102*  BUN 14 11  --  14  CREATININE 0.88 0.78  --  0.84  CALCIUM 8.5* 8.6*  --  8.4*  GFRNONAA >60 >60  --  >60  ANIONGAP 7 8  --  10     Hematology Recent Labs  Lab 02/14/21 1727 02/15/21 0342 02/15/21 2124 02/16/21 0153  WBC 9.8 7.9  --  7.4  RBC 5.12 5.10  --  4.80  HGB 15.7 15.5 14.3 14.6  HCT 48.1 47.4 42.0 44.8  MCV 93.9 92.9  --  93.3  MCH 30.7 30.4  --  30.4  MCHC 32.6 32.7  --  32.6  RDW 13.6 13.7  --  13.5  PLT 163 156  --  164    BNPNo results for input(s): BNP, PROBNP in the last 168 hours.   DDimer  Recent Labs  Lab 02/14/21 1727  DDIMER 0.66*     Radiology    CT CHEST WO CONTRAST  Result Date: 02/16/2021 CLINICAL DATA:  Lung nodule EXAM: CT CHEST WITHOUT CONTRAST TECHNIQUE: Multidetector CT imaging of the chest was performed following the standard protocol without IV contrast. COMPARISON:  08/15/2020 FINDINGS: Cardiovascular: Left fibrillator noted with proximal and distal leads in the right atrium and right ventricle, respectively. Coronary, aortic arch, and branch vessel atherosclerotic vascular disease. Mild cardiomegaly with fat density along the lateral wall the left ventricle possibly result of prior myocardial infarction. The ascending thoracic  aorta measures 4.2 cm in diameter. Mediastinum/Nodes: Unremarkable Lungs/Pleura: Scattered chronically stable pulmonary nodules up to 0.6 cm in diameter observed. Along the upper margin of a prior tree-in-bud cluster of nodularity in the left upper lobe there is a dominant pulmonary nodule which has substantially enlarged compared to 07/26/2020, currently measuring 22 by 19 by 19 mm (volume = 4200 mm^3), and previously measuring 6 by 4 by 5 mm (volume = 60 mm^3). Upper Abdomen: Fluid density exophytic lesion from the right kidney upper pole compatible with cyst. Musculoskeletal: Thoracic spondylosis with multilevel bridging spurring. IMPRESSION: 1. Along a cluster of tree-in-bud nodularity in the left upper lobe, a previous 60 cubic mm nodule currently measures 4200 cubic mm, averaging 20 mm in diameter. Although proximity to the tree-in-bud nodularity raises the possibility that this represents atypical infection such as MAI, the nodule growth is clearly different from the other nodules and raises the possibility of malignancy. Tissue diagnosis is recommended when feasible. 2. Ascending thoracic aorta measures 4.2 cm in diameter, stable by my measurements. Recommend annual imaging followup by CTA or MRA. This recommendation follows 2010 ACCF/AHA/AATS/ACR/ASA/SCA/SCAI/SIR/STS/SVM Guidelines for the Diagnosis and Management of Patients with Thoracic Aortic Disease. Circulation. 2010; 121: B716-R678. Aortic aneurysm NOS (ICD10-I71.9) 3. Other imaging findings of potential clinical significance: Aortic Atherosclerosis (ICD10-I70.0). Coronary atherosclerosis. Mild cardiomegaly. Electronically Signed   By: Van Clines M.D.   On: 02/16/2021 18:57   MR BRAIN WO CONTRAST  Result Date: 02/16/2021 CLINICAL DATA:  Acute neuro deficit.  Suspect stroke. EXAM: MRI HEAD WITHOUT CONTRAST TECHNIQUE: Multiplanar, multiecho pulse sequences of the brain and surrounding structures were obtained without intravenous contrast.  COMPARISON:  CT head 02/15/2021 FINDINGS: Brain: Acute infarct right medial thalamus measuring approximately 10 x 15 mm. Small areas of acute infarct in the frontal cortex bilaterally over the convexity and in the right parietal white matter. Mild atrophy without hydrocephalus. Negative for hemorrhage or mass. Vascular: Normal arterial flow voids Skull and upper cervical spine: No focal skeletal lesion. Sinuses/Orbits: Mucosal edema paranasal sinuses. Retention cyst right maxillary sinus. Bilateral cataract surgery Other: None IMPRESSION: Acute infarct right thalamus. Small punctate areas of acute infarct in the frontal lobes bilaterally and right parietal white matter. Findings suggest cerebral emboli. Electronically Signed   By: Franchot Gallo M.D.   On: 02/16/2021 13:25   CARDIAC CATHETERIZATION  Result Date: 02/15/2021   1st Mrg lesion is 55% stenosed.   Prox RCA lesion is  50% stenosed.   Prox RCA to Dist RCA lesion is 35% stenosed.   RPAV-1 lesion is 100% stenosed.   RPAV-2 lesion is 80% stenosed.   A stent was successfully placed.   Post intervention, there is a 0% residual stenosis.   Post intervention, there is a 0% residual stenosis. Acute coronary syndrome secondary to total occlusion of a large distal right coronary artery at a site of prior distal stenting. The LAD has mild irregularity without significant disease.  The large ramus intermediate vessel has previously noted 55% very proximal stenosis not significantly changed.  The AV groove circumflex is a small vessel. The RCA is a very large vessel with diffuse stents proximally to very distally.  There is proximal 50% stenosis prior to the proximal to mid stent.  There is diffuse 35% narrowing within the long proximal stent.  The distal stent is totally occluded just after PDA vessel. Difficult but successful PCI requiring PTCA, a 3.5 x 15 mm Scorflex multiple dilatations throughout the previously placed tandem 3.5 mm  distal stents with ultimate  noncompliant balloon dilatation to approximately 3.6 mm and insertion of a 2.75 x 22 mm Onyx Frontier new stent distal to the distal stent placed in overlap 75 to 80% stenosis beyond the stented segment, postdilated with a 3 oh balloon with residual narrowing of 0%. LVEDP 18 mmHg RECOMMENDATION: Initial triple drug therapy with plan discontinuance of aspirin and continuation of Plavix/for minimum of 6 to 12 months.  Near the completion of the procedure, the patient appeared slightly less responsive.  Blood sugar was obtained which was 63.  He was given one half amp of D50.  After the procedure, a subtle left facial droop developed and there appeared to be  left arm weakness..  A code stroke was activated and the patient was seen immediately by neurology and sent for head CT for further evaluation.   CT HEAD CODE STROKE WO CONTRAST  Result Date: 02/15/2021 CLINICAL DATA:  Code stroke.  Neuro deficit, acute, stroke suspected EXAM: CT HEAD WITHOUT CONTRAST TECHNIQUE: Contiguous axial images were obtained from the base of the skull through the vertex without intravenous contrast. COMPARISON:  None. FINDINGS: Brain: No evidence of acute large vascular territory infarction, hemorrhage, hydrocephalus, extra-axial collection or mass lesion/mass effect. Intracranial enhancement is noted, likely related to recent cardiac catheterization. Vascular: Contrast within vessels, likely related to recent cardiac catheterization. Skull: No acute fracture. Sinuses/Orbits: No acute findings. Other: No mastoid effusion. ASPECTS [facility] Stroke Program Early CT Score) Total score (0-10 with 10 being normal): 10. IMPRESSION: No evidence of acute intracranial abnormality.  ASPECTS is 10. Code stroke imaging results were communicated on 02/15/2021 at 6:47 pm to provider [facility] via secure text paging. Electronically Signed   By: Margaretha Sheffield M.D.   On: 02/15/2021 18:48   CT ANGIO HEAD NECK W WO CM W PERF (CODE STROKE)  Result Date:  02/15/2021 CLINICAL DATA:  Left-sided neglect EXAM: CT ANGIOGRAPHY HEAD AND NECK TECHNIQUE: Multidetector CT imaging of the head and neck was performed using the standard protocol during bolus administration of intravenous contrast. Multiplanar CT image reconstructions and MIPs were obtained to evaluate the vascular anatomy. Carotid stenosis measurements (when applicable) are obtained utilizing NASCET criteria, using the distal internal carotid diameter as the denominator. CONTRAST:  130mL OMNIPAQUE IOHEXOL 350 MG/ML SOLN COMPARISON:  Same day noncontrast head CT, brain MRI 11/02/2014 FINDINGS: CTA NECK FINDINGS Aortic arch: There is minimal calcified atherosclerotic plaque of the aortic arch and at the  origins of the great vessels. Right carotid system: No evidence of dissection, stenosis (50% or greater) or occlusion. Left carotid system: No evidence of dissection, stenosis (50% or greater) or occlusion. Vertebral arteries: Codominant. No evidence of dissection, stenosis (50% or greater) or occlusion. Skeleton: There is multilevel degenerative change of the cervical spine. There is no acute osseous abnormality or aggressive osseous lesion. Other neck: There is a right maxillary sinus mucous retention cyst. Upper chest: There is mosaic attenuation in the lung apices with interlobular septal thickening. There is a 1.6 cm lesion in the left upper lobe, incompletely imaged. Review of the MIP images confirms the above findings CTA HEAD FINDINGS Anterior circulation: There is mild calcification of the cavernous ICAs without hemodynamically significant stenosis or occlusion. The bilateral ACAs and MCAs are patent. There is no aneurysm. Posterior circulation: The V4 segments of the vertebral arteries are patent. The basilar artery is patent. The PCAs are patent. Venous sinuses: As permitted by contrast timing, patent. Anatomic variants: None. Review of the MIP images confirms the above findings IMPRESSION: 1. Patent  vasculature of the head and neck with no hemodynamically significant stenosis, occlusion, dissection, or aneurysm. 2. Soft tissue lesion in the left upper lobe concerning for malignancy, incompletely imaged. Recommend dedicated CT chest. 3. Mild pulmonary interstitial edema and mosaic attenuation suggesting small airway disease. Electronically Signed   By: Valetta Mole M.D.   On: 02/15/2021 19:04    Cardiac Studies   Cath: 02/15/21   1st Mrg lesion is 55% stenosed.   Prox RCA lesion is 50% stenosed.   Prox RCA to Dist RCA lesion is 35% stenosed.   RPAV-1 lesion is 100% stenosed.   RPAV-2 lesion is 80% stenosed.   A stent was successfully placed.   Post intervention, there is a 0% residual stenosis.   Post intervention, there is a 0% residual stenosis.   Acute coronary syndrome secondary to total occlusion of a large distal right coronary artery at a site of prior distal stenting.   The LAD has mild irregularity without significant disease.  The large ramus intermediate vessel has previously noted 55% very proximal stenosis not significantly changed.  The AV groove circumflex is a small vessel.   The RCA is a very large vessel with diffuse stents proximally to very distally.  There is proximal 50% stenosis prior to the proximal to mid stent.  There is diffuse 35% narrowing within the long proximal stent.  The distal stent is totally occluded just after PDA vessel.   Difficult but successful PCI requiring PTCA, a 3.5 x 15 mm Scorflex multiple dilatations throughout the previously placed tandem 3.5 mm  distal stents with ultimate noncompliant balloon dilatation to approximately 3.6 mm and insertion of a 2.75 x 22 mm Onyx Frontier new stent distal to the distal stent placed in overlap 75 to 80% stenosis beyond the stented segment, postdilated with a 3 oh balloon with residual narrowing of 0%.   LVEDP 18 mmHg   RECOMMENDATION: Initial triple drug therapy with plan discontinuance of aspirin and  continuation of Plavix/for minimum of 6 to 12 months.  Near the completion of the procedure, the patient appeared slightly less responsive.  Blood sugar was obtained which was 63.  He was given one half amp of D50.  After the procedure, a subtle left facial droop developed and there appeared to be  left arm weakness..  A code stroke was activated and the patient was seen immediately by neurology and sent for head CT  for further evaluation.   Diagnostic Dominance: Right Intervention    Echo: 02/15/21  IMPRESSIONS     1. Left ventricular ejection fraction, by estimation, is 40 to 45%. The  left ventricle has mildly decreased function. The left ventricle  demonstrates regional wall motion abnormalities (see scoring  diagram/findings for description). The left ventricular   internal cavity size was moderately dilated. Left ventricular diastolic  parameters are consistent with Grade I diastolic dysfunction (impaired  relaxation).   2. Right ventricular systolic function is normal. The right ventricular  size is normal. There is normal pulmonary artery systolic pressure.   3. Left atrial size was mildly dilated.   4. The mitral valve is normal in structure. Trivial mitral valve  regurgitation. No evidence of mitral stenosis.   5. The aortic valve is normal in structure. Aortic valve regurgitation is  not visualized.   6. Aortic dilatation noted. There is borderline dilatation of the aortic  root, measuring 38 mm.   Comparison(s): Prior images reviewed side by side. The left ventricular  function is unchanged. The left ventricular wall motion abnormalities are  worse, although the general distribution is similar. There seems to be  more some worsening of contractility  in the more distal portion of the inferior and inferoseptal walls, with  sparing of the true apex and without meaningful change in overall LVEF.   FINDINGS   Left Ventricle: Left ventricular ejection fraction, by  estimation, is 40  to 45%. The left ventricle has mildly decreased function. The left  ventricle demonstrates regional wall motion abnormalities. The left  ventricular internal cavity size was  moderately dilated. There is no left ventricular hypertrophy. Left  ventricular diastolic parameters are consistent with Grade I diastolic  dysfunction (impaired relaxation). Normal left ventricular filling  pressure.      LV Wall Scoring:  The basal inferolateral segment and basal inferior segment are dyskinetic.  The mid and distal inferior wall, mid inferolateral segment, and basal  inferoseptal segment are akinetic. The mid inferoseptal segment is  hypokinetic. The entire anterior wall, antero-lateral wall, entire  anterior  septum, apical lateral segment, and apex are normal.   Right Ventricle: The right ventricular size is normal. No increase in  right ventricular wall thickness. Right ventricular systolic function is  normal. There is normal pulmonary artery systolic pressure. The tricuspid  regurgitant velocity is 1.97 m/s, and   with an assumed right atrial pressure of 3 mmHg, the estimated right  ventricular systolic pressure is 24.2 mmHg.   Left Atrium: Left atrial size was mildly dilated.   Right Atrium: Right atrial size was normal in size.   Pericardium: There is no evidence of pericardial effusion.   Mitral Valve: The mitral valve is normal in structure. Trivial mitral  valve regurgitation. No evidence of mitral valve stenosis.   Tricuspid Valve: The tricuspid valve is normal in structure. Tricuspid  valve regurgitation is trivial.   Aortic Valve: The aortic valve is normal in structure. Aortic valve  regurgitation is not visualized. Aortic valve mean gradient measures 3.0  mmHg. Aortic valve peak gradient measures 4.5 mmHg. Aortic valve area, by  VTI measures 5.19 cm.   Pulmonic Valve: The pulmonic valve was normal in structure. Pulmonic valve  regurgitation is not  visualized.   Aorta: Aortic dilatation noted. There is borderline dilatation of the  aortic root, measuring 38 mm.   IAS/Shunts: No atrial level shunt detected by color flow Doppler.   Additional Comments: A device lead is visualized  in the right ventricle.   Patient Profile     70 y.o. male with history of CAD with multiple prior PCIs dating back to 1996(fom notes prior multiple stents to RCA, priot stent to LCx, HTN, OSA, aflutter, ventricular tachycardia with ICD followed by EP on beta blocker, mexilitene, and sotalol with prior  VT ablation 12/23/26, chronic systolic HF who was seen 09/01/1759 for the evaluation of chest pain. Underwent cardiac catheterization on 02/16/2021 PTCA/PCI of in-stent thrombosis/occlusion in the proximal PL-complicated with periprocedural CVA.  Assessment & Plan    NSTEMI: hsTn 3076, underwent cardiac cath 8/30 which showed total occluded large dRCA at site of prior stenting. Difficult but successful PCI/DES x 1 to RPA V/PL system. No further episodes of chest pain.  -- Continue beta-blocker, Entresto -- plan for 1 month triple therapy with aspirin/Plavix/Eliquis, plan to stop aspirin with continuation of plavix/eliquis   Periprocedural CVA: Code stroke called after cath 2/2 left hemineglect and hemiparesis and left gaze. CTA of the head did not show any significant stenoses or occlusion. MRI was positive for acute right thalamus infarct and small punctate areas of acute infarct in frontal lobes. Suggestive of cerebral emboli. Only deficit today in the left visual field. -- seen by neurology, now signed off -- PT/OT following with recommendations for CIR   Chronic combined systolic and diastolic HF: echo 8/2 with EF of 40-45% with mild dyskinesis of the left ventricular, basal inferior wall and inferolateral wall. Volume stable on exam --Continue home dose metoprolol, Jardiance.  Hx of VT: followed by EP. Some PVCs  -- continue sotalol and mexiletine      DM:  Continue SSI and Jardiance -- Hbg A1c 7.6   HLD: LDL 51 -- on statin    Paroxysmal afib: A-paced on telemetry -- resumed on Eliquis   Osteomyelitis: recent admission and discharged on 8/26 with toe amputation -- on keflex until 9/2 -- following with Dr. Amalia Hailey from podiatry  Pulmonary nodule: followed by Dr. Valeta Harms as an outpatient. CT chest yesterday showed previous 60 cubic mm nodule currently measures 4200 cubic mm, averaging 20 mm in diameter. Concern for malignancy?  -- pulmonary consulted Certainly, with enlarging nodule - will likely need biopsy for definitive Dx - unfortunately, he has just suffered 2 major events (MI - physiologically a STEMI although technically only NSTEMI by EKG criteria & Per-Procedural CVA with impressive improvement).  He is currently on Triple AC/Antiplatelet Rx with plans to continue at least 1 month -- then plan to d/c ASA & continue clopidogrel & apixiban. For Bx, would need to hold both agents  -  Apixiban x 48 hr & clopidogrel x 5 days (minimum) -- given recent MI & DES PCI - would be best if we could bridge with GP 2b3a Inhibitor while off of Clopidogrel & IV Heparin vs. Enoxaparin while of of apixiban. -- will discuss options with [facility] Pharm team to determine best COA- but would prefer to wait at least 1 month (esp due to CVA).   Dispo: PT / OT following with initial recommendation for CIR.   For questions or updates, please contact [place] Please consult [email or web address] for contact info under        Signed, Reino Bellis, NP  02/17/2021, 12:43 PM     ATTENDING ATTESTATION  I have seen, examined and evaluated the patient this PM after initial rounds with Reino Bellis, NP-C.  After reviewing all the available data and chart, we discussed the patients laboratory, study & physical findings  as well as symptoms in detail. I agree with her findings, examination as well as impression recommendations as per our discussion.    Attending  adjustments noted in italics.   Notable improvement.  Stable from a Cardiology standpoint & notably improved from a CVA standpoint.  - on stable regimen. We are waiting on PT assessment for possible CIR on d/c.  Unfortunately - CT Chest shows enlarging nodule suspicious for malignancy -- see note above for concerns re: Bx.  Hope for d/c to CIR in next 1-2 days.   Glenetta Hew, M.D., M.S. Interventional Cardiologist   Pager # [phone] Phone # [phone] [address] [place], [place] [id number]

## 2021-02-17 NOTE — Evaluation (Signed)
Physical Therapy Evaluation Patient Details Name: John [name] MRN: [id number] DOB: 1950/12/25 Today's Date: 02/17/2021   History of Present Illness  Pt presented to the ED 8/29 with chest pain. Pt with NSTEMI. Pt underwent cardiac cath 8/30 and post procedure developed lt sided weakness, lt neglect, and rt gaze preference. MRI: Acute infarct right thalamus. Small punctate areas of acute infarct  in the frontal lobes bilaterally and right parietal white matter.  Findings suggest cerebral emboli. PMH - CAD, multiple cardiac stents, HTN, OSA, aflutter, vtach, ICD, VT ablation 7/0/26, chronic systolic HF, lt second toe amputation 8/2 with no weight bearing restrictions, back surgery with resultant lt foot drop.  Clinical Impression  Pt presents to PT with significant deficits in balance, mobility and cognition. Pt with poor insight into deficits and he feels he can return home in the next few days. Recommend CIR for further rehab.     Follow Up Recommendations CIR    Equipment Recommendations  Rolling walker with 5" wheels;Wheelchair (measurements PT);Wheelchair cushion (measurements PT)    Recommendations for Other Services Rehab consult     Precautions / Restrictions Precautions Precautions: Fall Restrictions Weight Bearing Restrictions: No      Mobility  Bed Mobility Overal bed mobility: Needs Assistance Bed Mobility: Supine to Sit     Supine to sit: Supervision     General bed mobility comments: Assist for safety    Transfers Overall transfer level: Needs assistance Equipment used: Rolling walker (2 wheeled) Transfers: Sit to/from Stand Sit to Stand: Min assist         General transfer comment: assist to bring hips up and for balance. Pt with lt lateral lean  Ambulation/Gait Ambulation/Gait assistance: Mod assist Gait Distance (Feet): 60 Feet Assistive device: Rolling walker (2 wheeled) Gait Pattern/deviations: Step-through pattern;Decreased step length -  left;Steppage Gait velocity: decr Gait velocity interpretation: <1.31 ft/sec, indicative of household ambulator General Gait Details: Assist for balance and support. Pt with steppage gait on the left which is baseline for pt. Pt with left lateral lean and at times has lt foot outside of the walker. Verbal cues to stand more erect and to keep feet inside the walker. As pt approaches a seat he tends to leave walker and reach for the chair before he is all the way turned to the chair.  Stairs            Wheelchair Mobility    Modified Rankin (Stroke Patients Only) Modified Rankin (Stroke Patients Only) Pre-Morbid Rankin Score: No symptoms Modified Rankin: Moderately severe disability     Balance Overall balance assessment: Needs assistance Sitting-balance support: Feet supported;No upper extremity supported Sitting balance-Leahy Scale: Good     Standing balance support: Bilateral upper extremity supported Standing balance-Leahy Scale: Poor Standing balance comment: walker and min to min guard assist for static standing                             Pertinent Vitals/Pain Pain Assessment: No/denies pain    Home Living Family/patient expects to be discharged to:: Private residence Living Arrangements: Spouse/significant other Available Help at Discharge: Family;Available 24 hours/day (Pt takes care of wife. Daughter and granddaughter currently providing assist for wife) Type of Home: House Home Access: Stairs to enter;Ramped entrance Entrance Stairs-Rails: Left Entrance Stairs-Number of Steps: 5 STE in the front, home made ramp in the back for the dogs primarily, but he did go up and down.  Not ADA compliant. Home  Layout: One level Home Equipment: Shower seat;Grab bars - tub/shower;Hand held shower head;Other (comment) Additional Comments: Pt has lt AFO but states he doesn't usually use it    Prior Function Level of Independence: Independent                Hand Dominance   Dominant Hand: Right    Extremity/Trunk Assessment   Upper Extremity Assessment Upper Extremity Assessment: Defer to OT evaluation    Lower Extremity Assessment Lower Extremity Assessment: LLE deficits/detail LLE Deficits / Details: ankle 0/5 from prior back surgery, knee and hip grossly 4/5    Cervical / Trunk Assessment Cervical / Trunk Assessment: Normal  Communication   Communication: No difficulties  Cognition Arousal/Alertness: Awake/alert Behavior During Therapy: Impulsive Overall Cognitive Status: Impaired/Different from baseline Area of Impairment: Memory;Safety/judgement;Awareness;Problem solving                     Memory: Decreased short-term memory;Decreased recall of precautions Following Commands: Follows one step commands consistently Safety/Judgement: Decreased awareness of safety;Decreased awareness of deficits Awareness: Emergent Problem Solving: Requires verbal cues General Comments: Pt stating, "I am okay. I would let you know if I wasn't." Pt stated this as he was trying to stand on his own in the bathroom despite being told not too.      General Comments      Exercises     Assessment/Plan    PT Assessment Patient needs continued PT services  PT Problem List Decreased strength;Decreased balance;Decreased cognition;Decreased knowledge of precautions;Decreased mobility;Decreased knowledge of use of DME;Decreased safety awareness       PT Treatment Interventions DME instruction;Functional mobility training;Balance training;Patient/family education;Gait training;Therapeutic activities;Therapeutic exercise;Cognitive remediation    PT Goals (Current goals can be found in the Care Plan section)  Acute Rehab PT Goals Patient Stated Goal: Return to independence PT Goal Formulation: With patient Time For Goal Achievement: 02/24/21 Potential to Achieve Goals: Good    Frequency Min 4X/week   Barriers to discharge         Co-evaluation               AM-PAC PT "6 Clicks" Mobility  Outcome Measure Help needed turning from your back to your side while in a flat bed without using bedrails?: None Help needed moving from lying on your back to sitting on the side of a flat bed without using bedrails?: A Little Help needed moving to and from a bed to a chair (including a wheelchair)?: A Lot Help needed standing up from a chair using your arms (e.g., wheelchair or bedside chair)?: A Little Help needed to walk in hospital room?: A Lot Help needed climbing 3-5 steps with a railing? : Total 6 Click Score: 15    End of Session Equipment Utilized During Treatment: Gait belt Activity Tolerance: Patient tolerated treatment well Patient left: in chair;with call bell/phone within reach;with chair alarm set Nurse Communication: Mobility status PT Visit Diagnosis: Unsteadiness on feet (R26.81);Other abnormalities of gait and mobility (R26.89)    Time: 1700-1749 PT Time Calculation (min) (ACUTE ONLY): 35 min   Charges:   PT Evaluation $PT Eval Moderate Complexity: 1 Mod PT Treatments $Gait Training: 8-22 mins        [place] Pager [phone] Office [place] 02/17/2021, 3:36 PM

## 2021-02-17 NOTE — Telephone Encounter (Signed)
Please schedule patient for hospital follow-up with Dr. Valeta Harms in 1 month for enlarging left upper lobe lung nodule. He is one of Dr. Juline Patch established patients.  Thanks,  Wille Glaser

## 2021-02-17 NOTE — Progress Notes (Signed)
STROKE TEAM PROGRESS NOTE   INTERVAL HISTORY There is no one  at the bedside.  He lying in bed comfortably he is alert and interactive.  He has mild left-sided left-sided eye drooping and diplopia which improves when 1 eyes closed.  Neurological exam is improved and left-sided inattention is much better.  MRI scan of the brain done yesterday shows acute right medial thalamic as well as small punctate left frontal lobe infarcts bilaterally and right parietal white matter infarcts.  CT scan of the chest shows left upper lobe nodule which is enlarged from previous study and suspicious for malignancy. Vitals:   02/16/21 2340 02/17/21 0133 02/17/21 0432 02/17/21 1223  BP:  132/71 100/73 120/74  Pulse:  69 69 69  Resp:  17 18 16   Temp:  98.4 F (36.9 C) 97.6 F (36.4 C) 98 F (36.7 C)  TempSrc:  Oral Oral Oral  SpO2: 94% 97% 95% 98%  Weight:  96.2 kg    Height:       CBC:  Recent Labs  Lab 02/11/21 0549 02/14/21 1727 02/15/21 0342 02/15/21 2124 02/16/21 0153  WBC 6.5 9.8 7.9  --  7.4  NEUTROABS 4.4 7.7  --   --   --   HGB 15.7 15.7 15.5 14.3 14.6  HCT 49.7 48.1 47.4 42.0 44.8  MCV 94.3 93.9 92.9  --  93.3  PLT 169 163 156  --  283   Basic Metabolic Panel:  Recent Labs  Lab 02/11/21 0243 02/14/21 1727 02/15/21 0342 02/15/21 2124 02/16/21 0153  NA 141   < > 138 142 139  K 4.0   < > 3.8 3.8 3.8  CL 107   < > 110  --  112*  CO2 26   < > 20*  --  17*  GLUCOSE 131*   < > 132*  --  102*  BUN 14   < > 11  --  14  CREATININE 0.99   < > 0.78  --  0.84  CALCIUM 8.8*   < > 8.6*  --  8.4*  MG 2.2  --   --   --   --    < > = values in this interval not displayed.    Lipid Panel:  Recent Labs  Lab 02/15/21 0342  CHOL 101  TRIG 80  HDL 34*  CHOLHDL 3.0  VLDL 16  LDLCALC 51    HgbA1c: 8.0 on 02/08/2021 Recent Labs  Lab 02/17/21 0823  HGBA1C 7.6*   Urine Drug Screen: No results for input(s): LABOPIA, COCAINSCRNUR, LABBENZ, AMPHETMU, THCU, LABBARB in the last 168 hours.   Alcohol Level No results for input(s): ETH in the last 168 hours.  IMAGING past 24 hours CT CHEST WO CONTRAST  Result Date: 02/16/2021 CLINICAL DATA:  Lung nodule EXAM: CT CHEST WITHOUT CONTRAST TECHNIQUE: Multidetector CT imaging of the chest was performed following the standard protocol without IV contrast. COMPARISON:  08/15/2020 FINDINGS: Cardiovascular: Left fibrillator noted with proximal and distal leads in the right atrium and right ventricle, respectively. Coronary, aortic arch, and branch vessel atherosclerotic vascular disease. Mild cardiomegaly with fat density along the lateral wall the left ventricle possibly result of prior myocardial infarction. The ascending thoracic aorta measures 4.2 cm in diameter. Mediastinum/Nodes: Unremarkable Lungs/Pleura: Scattered chronically stable pulmonary nodules up to 0.6 cm in diameter observed. Along the upper margin of a prior tree-in-bud cluster of nodularity in the left upper lobe there is a dominant pulmonary nodule which has substantially enlarged  compared to 07/26/2020, currently measuring 22 by 19 by 19 mm (volume = 4200 mm^3), and previously measuring 6 by 4 by 5 mm (volume = 60 mm^3). Upper Abdomen: Fluid density exophytic lesion from the right kidney upper pole compatible with cyst. Musculoskeletal: Thoracic spondylosis with multilevel bridging spurring. IMPRESSION: 1. Along a cluster of tree-in-bud nodularity in the left upper lobe, a previous 60 cubic mm nodule currently measures 4200 cubic mm, averaging 20 mm in diameter. Although proximity to the tree-in-bud nodularity raises the possibility that this represents atypical infection such as MAI, the nodule growth is clearly different from the other nodules and raises the possibility of malignancy. Tissue diagnosis is recommended when feasible. 2. Ascending thoracic aorta measures 4.2 cm in diameter, stable by my measurements. Recommend annual imaging followup by CTA or MRA. This recommendation  follows 2010 ACCF/AHA/AATS/ACR/ASA/SCA/SCAI/SIR/STS/SVM Guidelines for the Diagnosis and Management of Patients with Thoracic Aortic Disease. Circulation. 2010; 121: B353-G992. Aortic aneurysm NOS (ICD10-I71.9) 3. Other imaging findings of potential clinical significance: Aortic Atherosclerosis (ICD10-I70.0). Coronary atherosclerosis. Mild cardiomegaly. Electronically Signed   By: Van Clines M.D.   On: 02/16/2021 18:57   MR BRAIN WO CONTRAST  Result Date: 02/16/2021 CLINICAL DATA:  Acute neuro deficit.  Suspect stroke. EXAM: MRI HEAD WITHOUT CONTRAST TECHNIQUE: Multiplanar, multiecho pulse sequences of the brain and surrounding structures were obtained without intravenous contrast. COMPARISON:  CT head 02/15/2021 FINDINGS: Brain: Acute infarct right medial thalamus measuring approximately 10 x 15 mm. Small areas of acute infarct in the frontal cortex bilaterally over the convexity and in the right parietal white matter. Mild atrophy without hydrocephalus. Negative for hemorrhage or mass. Vascular: Normal arterial flow voids Skull and upper cervical spine: No focal skeletal lesion. Sinuses/Orbits: Mucosal edema paranasal sinuses. Retention cyst right maxillary sinus. Bilateral cataract surgery Other: None IMPRESSION: Acute infarct right thalamus. Small punctate areas of acute infarct in the frontal lobes bilaterally and right parietal white matter. Findings suggest cerebral emboli. Electronically Signed   By: Franchot Gallo M.D.   On: 02/16/2021 13:25    PHYSICAL EXAM   Physical Exam  Constitutional: Appears well-developed and well-nourished elderly Caucasian male. He is pleasant and cooperative. He has a right sided gaze preference.  Psych: Affect appropriate to situation Eyes: No scleral injection HENT: No OP obstrucion MSK: no joint deformities.  Cardiovascular: Normal rate and regular rhythm.  Respiratory: Effort normal, non-labored breathing GI: Soft.  No distension. There is no  tenderness.  Skin: WDI  Neuro: Mental Status: Patient is awake, alert, oriented to person, place, month, year, and situation.  Patient is able to give a clear and coherent history.  No aphasia or dysarthria Cranial Nerves: II: Visual Fields are full. Pupils are equal, round, and reactive to light.   He has a right gaze preference, but he is able to cross midline without difficulty.  Left eye as slight esotropia.  Slight drooping of the left eyelid.  He has some subjective diplopia on upgaze but no skew eye deviation or restriction of movement is noted III,IV, VI: EOMI without ptosis or diplopia.  V: Facial sensation is symmetric to temperature VII: Mild left lower facial asymmetry when he smiles.  VIII: Hearing is intact to voice X: Palate elevates symmetrically XI: Shoulder shrug is symmetric. XII: Tongue protrudes midline without atrophy or fasciculations.  Motor: Tone is normal. Bulk is normal. 5/5 strength was present in all four extremities.  Mild weakness of left grip and intrinsic hand muscles.  Diminished fine finger movements on the  left.  Orbits right over left upper extremity. Sensory: Sensation is symmetric to light touch and temperature in the arms and legs.  Mild extinction to DSS present on the left.     Plantars: Toes are downgoing bilaterally.   Cerebellar: FNF and HKS are intact bilaterally         ASSESSMENT/PLAN [name] is a 70 y.o. male with history of  history of CAD with multiple prior PCIs dating back to 1996(fom notes prior multiple stents to RCA, prior stent to [facility], HTN, OSA, aflutter, ventricular tachycardia with ICD followed by EP on beta blocker, mexilitene, and sotalol with prior  VT ablation 02/19/22, chronic systolic HF admitted for cardiac catheterization. He is on eliquis, last dose was the day before admission. He had multiple coronary stents placed today during the procedure he had a large volume of heparin and 600mg  plavix. After the  procedure while he was still on the table he developed L facial droop and dense L-sided hemineglect. Stroke code was called. NIHSS = 12.   Head CT showed NAICP. Given recent use of multiple anticoagulants patient was not a candidate for TNK. CTA showed no LVO therefore intervention was not indicated.   Right thalamic and bilateral frontal and right parietal infarcts embolic secondary to cardiac interventional procedure.  .  Code Stroke  CT head No acute abnormality.  Small vessel disease. Atrophy.  ASPECTS 10.     CTA head & neck : no hemodynamically significant stenosis, occlusion, dissection or aneurysm. Thre is a 1.6 cm lesion at left upper lobe.  MRI right medial thalamic and bilateral frontal and right parietal white matter acute infarcts. 2D Echo : LVEF 40-44%, no LVH, dilated LA, no IA shunt    LDL 51 HgbA1c 8.0 VTE prophylaxis -  scd    Diet   Diet Carb Modified Fluid consistency: Thin; Room service appropriate? Yes   Eliquis (apixaban) daily prior to admission, now will start aspirin 81 mg daily, clopidogrel 75 mg daily, and Eliquis (apixaban) daily, for 1 month then continue with aspirin and eliquis only. This is pending repeat imaging showing no hemorrhage and cardiology clearance. Therapy recommendations:  n/a Disposition:  home  Hypertension Home meds:   entresto 24/26mg  , metoprolol 100mg  xl, betapace  Stable Permissive hypertension (OK if < 220/120) but gradually normalize in 5-7 days Long-term BP goal normotensive  Hyperlipidemia Home meds:  crestor 20mg  ,  resumed in hospital LDL 51, goal < 70 Continue statin at discharge   Diabetes type II Uncontrolled Home meds:  jardiance 25mg  daily levimir insulin  HgbA1c 8.0, goal < 7.0 CBGs Recent Labs    02/16/21 2136 02/17/21 0616 02/17/21 1131  GLUCAP 156* 112* 112*    SSI  Other Stroke Risk Factors  Advanced Age >/= 29      Obesity, Body mass index is 28.75 kg/m., BMI >/= 30 associated with increased stroke  risk, recommend weight loss, diet and exercise as appropriate   Family hx stroke   Coronary artery disease  Chronic combined systolic and diastolic HF.   Obstructive sleep apnea,  on CPAP at home   Other Active Problems Nstemi: hsTn 3076.  ->  Unfortunately,-did not meet criteria for initially paged as a STEMI but canceled.  Cardiac cath yesterday showed completed and was included RPA V/PL system with a stent restenosis/thrombosis treated with angioplasty and second stent placement. Continue beta-blocker Entresto Plan at baseline.  Being back on Eliquis especially with having periprocedural stroke is restart Eliquis  after MRI. We will plan for 1 month triple therapy with aspirin-Plavix-Eliquis, --> Stop aspirin after 1 month at the 99 therapy for at least 6 months if not longer based on him having in-stent restenosis.  DM: on jardiance. Paroxysmal Afib: a paced on tlemetry.  Osteomyelitis: on keflex until 9/2.   Hospital day # 3    Patient continues to be showing neurological improvement.  Recommend aspirin and Plavix and Eliquis for a month followed by aspirin and Eliquis thereafter.  Aggressive risk factor modification.  Recommend pulmonary consult for suspected malignant lung nodule.  No family available at the bedside for discussion.  Stroke team will sign off.  Kindly call for questions Greater than 50% time during visit 25-minute visit was spent on counseling and coordination of care about his embolic strokes discussion about stroke prevention and treatment and answering questions.D/W Dr Ellyn Hack. Antony Contras, MD Medical Director [place] Pager: [phone] 02/17/2021 1:17 PM     To contact Stroke Continuity provider, please refer to [email or web address] After hours, contact General Neurology

## 2021-02-17 NOTE — Consult Note (Addendum)
NAME:  John [name], MRN:  [id number], DOB:  01/12/1951, LOS: 3 ADMISSION DATE:  02/14/2021, CONSULTATION DATE:  02/17/21 REFERRING MD:  Glenetta Hew, MD CHIEF COMPLAINT:  Lung Mass   History of Present Illness:  John [name] is a 70 year old male with history of atrial flutter, DMII, hypertension, hyperlipidemia, pulmonary nodules and coronary artery disease who was admitted 8/29 for chest pain. He underwent LHC on 8/31 with PTCA/PCI of in-stent thrombosis/occlusion in the proximal PL complicated by periprocedural CVA. He is now on eliquis, plavix and aspirin for DES, strokes and atrial flutter.  PCCM has been consulted for enlarging left upper lobe nodule which is now 22 x 19 x 15mm compared to 6 x 4 x 30mm in 07/2020. He is followed by Dr. Valeta Harms for lung nodules in the pulmonary clinic.   He is using Arnuity Ellipta daily and as needed albuterol for COPD. He denies any complaints of dyspnea, wheezing or cough at this time.   Pertinent  Medical History   - sleep apnea - pulmonary nodules - coronary artery disease - hypertension - hyperlipidemia - DMII - atrial flutter  Significant Hospital Events: Including procedures, antibiotic start and stop dates in addition to other pertinent events   Admitted 8/29 for chest pain 8/31 LHC with PCI and stent placements, noted to have periprocedural CVA 9/1 PCCM consulted for enlarging lung nodule/mass  Interim History / Subjective:  N/a  Objective   Blood pressure 113/89, pulse 72, temperature 98 F (36.7 C), temperature source Oral, resp. rate 18, height 6' (1.829 m), weight 96.2 kg, SpO2 97 %.        Intake/Output Summary (Last 24 hours) at 02/17/2021 1602 Last data filed at 02/17/2021 1529 Gross per 24 hour  Intake 460 ml  Output 725 ml  Net -265 ml   Filed Weights   02/14/21 1728 02/17/21 0133  Weight: 98.4 kg 96.2 kg    Examination: General: No acute distress, sitting up in chair HENT: [place]/AT, sclera anicteric, left eye  patch Lungs: Clear to auscultation bilaterally, no wheezing. Cardiovascular: Regular rate and rhythm.  No murmurs or rubs. Abdomen: Soft, nontender, nondistended, bowel sounds present Extremities: Warm, no edema Neuro: Alert and oriented, slight left facial droop, moving all extremities GU: Deferred  Resolved Hospital Problem list     Assessment & Plan:  Enlarging Left Upper Lobe Lung Nodule Patient with significant smoking history with enlarging nodule over the past 6 months is concerning for a primary lung malignancy. He is on treatment for coronary artery disease requiring stent placement and perioperative stroke with aspirin, plavix and eliquis. Unfortunately these treatments hinder our ability to perform procedure at this time.   Plan: - Patient will need to follow up with Dr. Valeta Harms in 1 month after he is able to stop aspirin therapy to discuss planning options for navigational bronchopsy and biopsy.  - He will need have the plavix held for 5 days prior to his procedure, will discuss with cardiology on earliest timeline this could be held. The eliquis can be bridged with lovenox if needed.  - PCCM will sign off, please call with any questions.   Labs   CBC: Recent Labs  Lab 02/11/21 0549 02/14/21 1727 02/15/21 0342 02/15/21 2124 02/16/21 0153  WBC 6.5 9.8 7.9  --  7.4  NEUTROABS 4.4 7.7  --   --   --   HGB 15.7 15.7 15.5 14.3 14.6  HCT 49.7 48.1 47.4 42.0 44.8  MCV 94.3 93.9 92.9  --  93.3  PLT 169 163 156  --  712    Basic Metabolic Panel: Recent Labs  Lab 02/11/21 0243 02/14/21 1727 02/15/21 0342 02/15/21 2124 02/16/21 0153  NA 141 138 138 142 139  K 4.0 4.2 3.8 3.8 3.8  CL 107 108 110  --  112*  CO2 26 23 20*  --  17*  GLUCOSE 131* 136* 132*  --  102*  BUN 14 14 11   --  14  CREATININE 0.99 0.88 0.78  --  0.84  CALCIUM 8.8* 8.5* 8.6*  --  8.4*  MG 2.2  --   --   --   --    GFR: Estimated Creatinine Clearance: 95.6 mL/min (by C-G formula based on SCr of  0.84 mg/dL). Recent Labs  Lab 02/11/21 0549 02/14/21 1727 02/15/21 0342 02/16/21 0153  WBC 6.5 9.8 7.9 7.4    Liver Function Tests: No results for input(s): AST, ALT, ALKPHOS, BILITOT, PROT, ALBUMIN in the last 168 hours. No results for input(s): LIPASE, AMYLASE in the last 168 hours. No results for input(s): AMMONIA in the last 168 hours.  ABG    Component Value Date/Time   PHART 7.404 02/15/2021 2124   PCO2ART 30.2 (L) 02/15/2021 2124   PO2ART 67 (L) 02/15/2021 2124   HCO3 18.9 (L) 02/15/2021 2124   TCO2 20 (L) 02/15/2021 2124   ACIDBASEDEF 5.0 (H) 02/15/2021 2124   O2SAT 94.0 02/15/2021 2124     Coagulation Profile: Recent Labs  Lab 02/14/21 2033  INR 1.2    Cardiac Enzymes: No results for input(s): CKTOTAL, CKMB, CKMBINDEX, TROPONINI in the last 168 hours.  HbA1C: Hgb A1c MFr Bld  Date/Time Value Ref Range Status  02/17/2021 08:23 AM 7.6 (H) 4.8 - 5.6 % Final    Comment:    (NOTE) Pre diabetes:          5.7%-6.4%  Diabetes:              >6.4%  Glycemic control for   <7.0% adults with diabetes   02/08/2021 07:39 PM 8.0 (H) 4.8 - 5.6 % Final    Comment:    (NOTE) Pre diabetes:          5.7%-6.4%  Diabetes:              >6.4%  Glycemic control for   <7.0% adults with diabetes     CBG: Recent Labs  Lab 02/16/21 1134 02/16/21 1639 02/16/21 2136 02/17/21 0616 02/17/21 1131  GLUCAP 112* 122* 156* 112* 112*    Review of Systems:   Review of Systems  Constitutional:  Negative for chills, fever, malaise/fatigue and weight loss.  HENT:  Negative for congestion, sinus pain and sore throat.   Eyes: Negative.   Respiratory:  Negative for cough, hemoptysis, sputum production, shortness of breath and wheezing.   Cardiovascular:  Negative for chest pain, palpitations, orthopnea, claudication and leg swelling.  Gastrointestinal:  Negative for abdominal pain, heartburn, nausea and vomiting.  Genitourinary: Negative.   Musculoskeletal:  Negative for  joint pain and myalgias.  Skin:  Negative for rash.  Neurological:  Negative for weakness.  Endo/Heme/Allergies: Negative.   Psychiatric/Behavioral: Negative.     Past Medical History:  He,  has a past medical history of Atrial flutter ([place]), Coronary artery disease, Diabetes mellitus, GERD (gastroesophageal reflux disease), History of nuclear stress test (04/04/2011), Hyperlipidemia, Hypertension, Left foot drop, Myocardial infarction [facility] [place]), Shortness of breath, and Sleep apnea.   Surgical History:   Past Surgical History:  Procedure Laterality Date   AMPUTATION TOE Left 02/10/2021   Procedure: AMPUTATION  LEFT SECOND TOE;  Surgeon: Edrick Kins, DPM;  Location: [place];  Service: Podiatry;  Laterality: Left;   [place]  2010   6 stents total   CARDIAC CATHETERIZATION  01/2000   percutaneous transluminal coronary balloon angioplasty of mid RCA stenotic lesion   CARDIAC CATHETERIZATION  06/2006   no stenting; ischemic cardiomyopathy, EF 40-45%   CARDIOVERSION N/A 07/28/2016   Procedure: CARDIOVERSION;  Surgeon: Troy Sine, MD;  Location: [place];  Service: Cardiovascular;  Laterality: N/A;   CORONARY ANGIOPLASTY  09/1998   mid-distal RCA balloon dilatation, 4.5 & 5.0 stents    CORONARY ANGIOPLASTY WITH STENT PLACEMENT  03/1994   angioplasty & stenting (non-DES) of circumflex/prox ramus intermedius   CORONARY ANGIOPLASTY WITH STENT PLACEMENT  10/1994   large iliac [id number] stent to RCA   [place]  12/2002   4.43mm stents x2 of RCA   CORONARY ANGIOPLASTY WITH STENT PLACEMENT  01/2005   cutting balloon arthrectomy of distal RCA & Cypher DES 3.5x13; cutting balloon arthrectomy of mid RCA with Cypher DES 3.5x18   CORONARY ANGIOPLASTY WITH STENT PLACEMENT  11/2008   stenting of mid RCA with 4.0x75mm driver, non-DES   CORONARY BALLOON ANGIOPLASTY N/A 02/15/2021   Procedure: CORONARY BALLOON ANGIOPLASTY;  Surgeon: Troy Sine, MD;  Location: [place] [facility];  Service: Cardiovascular;  Laterality: N/A;   ICD IMPLANT N/A 08/20/2020   Procedure: ICD IMPLANT;  Surgeon: Constance Haw, MD;  Location: [place] [facility];  Service: Cardiovascular;  Laterality: N/A;   INTRAVASCULAR PRESSURE WIRE/FFR STUDY N/A 03/02/2020   Procedure: INTRAVASCULAR PRESSURE WIRE/FFR STUDY;  Surgeon: Leonie Man, MD;  Location: [place] [facility];  Service: Cardiovascular;  Laterality: N/A;   LEFT HEART CATH AND CORONARY ANGIOGRAPHY N/A 03/02/2020   Procedure: LEFT HEART CATH AND CORONARY ANGIOGRAPHY;  Surgeon: Leonie Man, MD;  Location: [place] [facility];  Service: Cardiovascular;  Laterality: N/A;   LEFT HEART CATH AND CORONARY ANGIOGRAPHY N/A 08/19/2020   Procedure: LEFT HEART CATH AND CORONARY ANGIOGRAPHY;  Surgeon: Lorretta Harp, MD;  Location: [place] [facility];  Service: Cardiovascular;  Laterality: N/A;   LEFT HEART CATH AND CORONARY ANGIOGRAPHY N/A 02/15/2021   Procedure: LEFT HEART CATH AND CORONARY ANGIOGRAPHY;  Surgeon: Troy Sine, MD;  Location: [place] [facility];  Service: Cardiovascular;  Laterality: N/A;   LEFT HEART CATHETERIZATION WITH CORONARY ANGIOGRAM N/A 02/27/2012   Procedure: LEFT HEART CATHETERIZATION WITH CORONARY ANGIOGRAM;  Surgeon: Lorretta Harp, MD;  [facility];  Service: Cardiovascular;  Laterality: N/A;   TRANSTHORACIC ECHOCARDIOGRAM  07/29/2010   EF 50=55%, mod inf wall hypokinesis & mild post wall hypokinesis; LA mild-mod dilated; mild mitral annular calcif & mild MR; mild TR & elevated RV systolic pressure; AV mildly sclerotic; mild aortic root dilatation    V TACH ABLATION N/A 01/20/2021   Procedure: V TACH ABLATION;  Surgeon: Vickie Epley, MD;  Location: [place] [facility];  Service: Cardiovascular;  Laterality: N/A;     Social History:   reports that he quit smoking about 4 years ago. His smoking use included cigarettes. He has a 50.00 pack-year smoking  history. He has never used smokeless tobacco. He reports that he does not currently use alcohol. He reports that he does not use drugs.   Family History:  His family history includes Heart attack in his father.   Allergies Allergies  Allergen Reactions   Omega-3 Fatty Acids Hives and Itching   Benazepril Other (See Comments)    hyperkalemia   Fish Allergy Itching     Home Medications  Prior to Admission medications   Medication Sig Start Date End Date Taking? Authorizing Provider  acetaminophen (TYLENOL) 500 MG tablet Take 500 mg by mouth every 6 (six) hours as needed for moderate pain.   Yes [provider]  albuterol (VENTOLIN HFA) 108 (90 Base) MCG/ACT inhaler Inhale 2 puffs into the lungs every 6 (six) hours as needed for wheezing or shortness of breath. 06/14/20  Yes Icard, Octavio Graves, DO  cephALEXin (KEFLEX) 500 MG capsule Take 1 capsule (500 mg total) by mouth every 8 (eight) hours for 7 days. Patient taking differently: Take 500 mg by mouth every 8 (eight) hours. Start date :02/11/21 02/11/21 02/18/21 Yes Arrien, Jimmy Picket, MD  ELIQUIS 5 MG TABS tablet TAKE 1 TABLET BY MOUTH TWICE A DAY Patient taking differently: Take 5 mg by mouth 2 (two) times daily. 10/13/20  Yes Camnitz, Will Hassell Done, MD  ENTRESTO 24-26 MG TAKE 1 TABLET BY MOUTH TWICE A DAY Patient taking differently: Take 1 tablet by mouth 2 (two) times daily. 10/08/20  Yes Troy Sine, MD  Fluticasone Furoate (ARNUITY ELLIPTA) 200 MCG/ACT AEPB Inhale 1 puff into the lungs daily. 12/29/20  Yes Icard, Bradley L, DO  insulin aspart (NOVOLOG FLEXPEN) 100 UNIT/ML FlexPen INJECT 5-20 UNITS SUBCUTANEOUSLY WITH LUNCH AND DINNER Patient taking differently: Inject 6-8 Units into the skin daily as needed for high blood sugar. 06/16/20  Yes PickardCammie Mcgee, MD  JARDIANCE 25 MG TABS tablet TAKE 1 TABLET BY MOUTH EVERY DAY Patient taking differently: Take 25 mg by mouth daily. 11/09/20  Yes [name], MD   ketoconazole (NIZORAL) 2 % shampoo Apply 1 application topically 2 (two) times a week. 02/09/21  Yes [provider]  LEVEMIR FLEXTOUCH 100 UNIT/ML FlexPen INJECT 46 UNITS INTO THE SKIN DAILY. DX:E11.9 Patient taking differently: Inject 46 Units into the skin daily. 01/11/21  Yes [name], MD  metoprolol succinate (TOPROL-XL) 100 MG 24 hr tablet Take 1 tablet (100 mg total) by mouth daily. Take with or immediately following a meal. Patient taking differently: Take 100 mg by mouth daily. 11/26/20  Yes Camnitz, Ocie Doyne, MD  mexiletine (MEXITIL) 150 MG capsule Take 2 capsules (300 mg total) by mouth every 12 (twelve) hours. Patient taking differently: Take 150 mg by mouth 2 (two) times daily. 02/09/21  Yes Baldwin Jamaica, PA-C  montelukast (SINGULAIR) 10 MG tablet TAKE 1 TABLET BY MOUTH EVERY DAY Patient taking differently: Take 10 mg by mouth daily. 08/19/20  Yes [name], MD  nitroGLYCERIN (NITROSTAT) 0.4 MG SL tablet PLACE 1 TABLET (0.4 MG TOTAL) UNDER THE TONGUE EVERY 5 (FIVE) MINUTES AS NEEDED FOR CHEST PAIN. 07/27/20  Yes Troy Sine, MD  rosuvastatin (CRESTOR) 20 MG tablet TAKE 1 TABLET BY MOUTH EVERY DAY Patient taking differently: Take 20 mg by mouth daily. 06/21/20  Yes Troy Sine, MD  sotalol (BETAPACE) 160 MG tablet Take 1 tablet (160 mg total) by mouth 2 (two) times daily. 01/28/21  Yes Baldwin Jamaica, PA-C  glucose blood (ONETOUCH ULTRA) test strip USE TO CHECK BLOOD SUGAR 3 TIMES A DAY (E11.9) 12/09/20   [name], MD  HYDROcodone-acetaminophen (NORCO/VICODIN) 5-325 MG tablet Take 1 tablet by mouth every  6 (six) hours as needed for severe pain. Patient not taking: No sig reported 02/11/21   Arrien, Jimmy Picket, MD  Insulin Pen Needle (NOVOFINE) 32G X 6 MM MISC 1 each by Other route daily. 08/19/19   [name], MD     Critical care time: n/a    Freda Jackson, MD [facility] Office: [phone]   See  Amion for personal pager PCCM on call pager [phone] until 7pm. Please call Elink 7p-7a. [phone]

## 2021-02-18 ENCOUNTER — Encounter (HOSPITAL_COMMUNITY): Payer: Self-pay | Admitting: Physical Medicine & Rehabilitation

## 2021-02-18 ENCOUNTER — Inpatient Hospital Stay (HOSPITAL_COMMUNITY)
Admission: RE | Admit: 2021-02-18 | Discharge: 2021-02-24 | DRG: 057 | Disposition: A | Discharge status: Other Acute Inpatient Hospital | Payer: Medicare Other | Source: Intra-hospital | Attending: Physical Medicine & Rehabilitation | Admitting: Physical Medicine & Rehabilitation

## 2021-02-18 ENCOUNTER — Other Ambulatory Visit (HOSPITAL_COMMUNITY): Payer: Self-pay

## 2021-02-18 ENCOUNTER — Other Ambulatory Visit: Payer: Self-pay

## 2021-02-18 DIAGNOSIS — I249 Acute ischemic heart disease, unspecified: Secondary | ICD-10-CM

## 2021-02-18 DIAGNOSIS — I69398 Other sequelae of cerebral infarction: Principal | ICD-10-CM

## 2021-02-18 DIAGNOSIS — B951 Streptococcus, group B, as the cause of diseases classified elsewhere: Secondary | ICD-10-CM | POA: Diagnosis present

## 2021-02-18 DIAGNOSIS — Z955 Presence of coronary angioplasty implant and graft: Secondary | ICD-10-CM | POA: Diagnosis not present

## 2021-02-18 DIAGNOSIS — I69392 Facial weakness following cerebral infarction: Secondary | ICD-10-CM

## 2021-02-18 DIAGNOSIS — E663 Overweight: Secondary | ICD-10-CM | POA: Diagnosis present

## 2021-02-18 DIAGNOSIS — E1169 Type 2 diabetes mellitus with other specified complication: Secondary | ICD-10-CM | POA: Diagnosis present

## 2021-02-18 DIAGNOSIS — H532 Diplopia: Secondary | ICD-10-CM | POA: Diagnosis present

## 2021-02-18 DIAGNOSIS — I110 Hypertensive heart disease with heart failure: Secondary | ICD-10-CM | POA: Diagnosis present

## 2021-02-18 DIAGNOSIS — I252 Old myocardial infarction: Secondary | ICD-10-CM | POA: Diagnosis not present

## 2021-02-18 DIAGNOSIS — I639 Cerebral infarction, unspecified: Secondary | ICD-10-CM | POA: Diagnosis present

## 2021-02-18 DIAGNOSIS — Z7984 Long term (current) use of oral hypoglycemic drugs: Secondary | ICD-10-CM

## 2021-02-18 DIAGNOSIS — I472 Ventricular tachycardia, unspecified: Secondary | ICD-10-CM

## 2021-02-18 DIAGNOSIS — Z89422 Acquired absence of other left toe(s): Secondary | ICD-10-CM | POA: Diagnosis not present

## 2021-02-18 DIAGNOSIS — I2510 Atherosclerotic heart disease of native coronary artery without angina pectoris: Secondary | ICD-10-CM | POA: Diagnosis present

## 2021-02-18 DIAGNOSIS — G4733 Obstructive sleep apnea (adult) (pediatric): Secondary | ICD-10-CM

## 2021-02-18 DIAGNOSIS — Z79899 Other long term (current) drug therapy: Secondary | ICD-10-CM

## 2021-02-18 DIAGNOSIS — I4892 Unspecified atrial flutter: Secondary | ICD-10-CM | POA: Diagnosis not present

## 2021-02-18 DIAGNOSIS — I63139 Cerebral infarction due to embolism of unspecified carotid artery: Secondary | ICD-10-CM | POA: Diagnosis not present

## 2021-02-18 DIAGNOSIS — Z95810 Presence of automatic (implantable) cardiac defibrillator: Secondary | ICD-10-CM | POA: Diagnosis not present

## 2021-02-18 DIAGNOSIS — E669 Obesity, unspecified: Secondary | ICD-10-CM

## 2021-02-18 DIAGNOSIS — Z6828 Body mass index (BMI) 28.0-28.9, adult: Secondary | ICD-10-CM | POA: Diagnosis not present

## 2021-02-18 DIAGNOSIS — K219 Gastro-esophageal reflux disease without esophagitis: Secondary | ICD-10-CM | POA: Diagnosis present

## 2021-02-18 DIAGNOSIS — E119 Type 2 diabetes mellitus without complications: Secondary | ICD-10-CM

## 2021-02-18 DIAGNOSIS — Z87891 Personal history of nicotine dependence: Secondary | ICD-10-CM | POA: Diagnosis not present

## 2021-02-18 DIAGNOSIS — R21 Rash and other nonspecific skin eruption: Secondary | ICD-10-CM | POA: Diagnosis present

## 2021-02-18 DIAGNOSIS — M869 Osteomyelitis, unspecified: Secondary | ICD-10-CM | POA: Diagnosis present

## 2021-02-18 DIAGNOSIS — E785 Hyperlipidemia, unspecified: Secondary | ICD-10-CM | POA: Diagnosis present

## 2021-02-18 DIAGNOSIS — Z8249 Family history of ischemic heart disease and other diseases of the circulatory system: Secondary | ICD-10-CM | POA: Diagnosis not present

## 2021-02-18 DIAGNOSIS — I5042 Chronic combined systolic (congestive) and diastolic (congestive) heart failure: Secondary | ICD-10-CM | POA: Diagnosis present

## 2021-02-18 DIAGNOSIS — I959 Hypotension, unspecified: Secondary | ICD-10-CM | POA: Diagnosis present

## 2021-02-18 DIAGNOSIS — I10 Essential (primary) hypertension: Secondary | ICD-10-CM

## 2021-02-18 DIAGNOSIS — C3412 Malignant neoplasm of upper lobe, left bronchus or lung: Secondary | ICD-10-CM | POA: Diagnosis present

## 2021-02-18 DIAGNOSIS — R911 Solitary pulmonary nodule: Secondary | ICD-10-CM | POA: Diagnosis present

## 2021-02-18 DIAGNOSIS — I480 Paroxysmal atrial fibrillation: Secondary | ICD-10-CM | POA: Diagnosis present

## 2021-02-18 DIAGNOSIS — R29818 Other symptoms and signs involving the nervous system: Secondary | ICD-10-CM

## 2021-02-18 DIAGNOSIS — Z9989 Dependence on other enabling machines and devices: Secondary | ICD-10-CM

## 2021-02-18 DIAGNOSIS — J449 Chronic obstructive pulmonary disease, unspecified: Secondary | ICD-10-CM | POA: Diagnosis present

## 2021-02-18 DIAGNOSIS — Z794 Long term (current) use of insulin: Secondary | ICD-10-CM

## 2021-02-18 DIAGNOSIS — Z7901 Long term (current) use of anticoagulants: Secondary | ICD-10-CM

## 2021-02-18 DIAGNOSIS — F419 Anxiety disorder, unspecified: Secondary | ICD-10-CM | POA: Diagnosis not present

## 2021-02-18 DIAGNOSIS — Z8679 Personal history of other diseases of the circulatory system: Secondary | ICD-10-CM

## 2021-02-18 DIAGNOSIS — E1143 Type 2 diabetes mellitus with diabetic autonomic (poly)neuropathy: Secondary | ICD-10-CM

## 2021-02-18 DIAGNOSIS — I63439 Cerebral infarction due to embolism of unspecified posterior cerebral artery: Secondary | ICD-10-CM | POA: Diagnosis not present

## 2021-02-18 LAB — GLUCOSE, CAPILLARY
Glucose-Capillary: 138 mg/dL — ABNORMAL HIGH (ref 70–99)
Glucose-Capillary: 144 mg/dL — ABNORMAL HIGH (ref 70–99)
Glucose-Capillary: 113 mg/dL — ABNORMAL HIGH (ref 70–99)
Glucose-Capillary: 212 mg/dL — ABNORMAL HIGH (ref 70–99)

## 2021-02-18 MED ORDER — ALBUTEROL SULFATE HFA 108 (90 BASE) MCG/ACT IN AERS: 2.0000 | INHALATION_SPRAY | Freq: Four times a day (QID) | RESPIRATORY_TRACT | Status: DC | PRN

## 2021-02-18 MED ORDER — HYDROCODONE-ACETAMINOPHEN 5-325 MG PO TABS: 1.0000 | ORAL_TABLET | Freq: Four times a day (QID) | ORAL | Status: DC | PRN

## 2021-02-18 MED ORDER — ASPIRIN 81 MG PO TBEC: 81.0000 mg | DELAYED_RELEASE_TABLET | Freq: Every day | ORAL | 11 refills | Status: DC

## 2021-02-18 MED ORDER — TRAZODONE HCL 50 MG PO TABS: 25.0000 mg | ORAL_TABLET | Freq: Every evening | ORAL | Status: DC | PRN

## 2021-02-18 MED ORDER — DIPHENHYDRAMINE HCL 12.5 MG/5ML PO ELIX
12.5000 mg | ORAL_SOLUTION | Freq: Four times a day (QID) | ORAL | Status: DC | PRN
  Administered 2021-02-19: 25 mg via ORAL
  Filled 2021-02-18: qty 0

## 2021-02-18 MED ORDER — PROCHLORPERAZINE 25 MG RE SUPP: 12.5000 mg | Freq: Four times a day (QID) | RECTAL | Status: DC | PRN

## 2021-02-18 MED ORDER — NITROGLYCERIN 0.4 MG SL SUBL: 0.4000 mg | SUBLINGUAL_TABLET | SUBLINGUAL | Status: DC | PRN

## 2021-02-18 MED ORDER — CEPHALEXIN 250 MG PO CAPS
500.0000 mg | ORAL_CAPSULE | Freq: Three times a day (TID) | ORAL | Status: AC
  Administered 2021-02-18: 500 mg via ORAL
  Filled 2021-02-18: qty 2

## 2021-02-18 MED ORDER — SACUBITRIL-VALSARTAN 24-26 MG PO TABS
1.0000 | ORAL_TABLET | Freq: Two times a day (BID) | ORAL | Status: DC
  Administered 2021-02-18 – 2021-02-23 (×11): 1 via ORAL
  Filled 2021-02-18 (×12): qty 1

## 2021-02-18 MED ORDER — MEXILETINE HCL 150 MG PO CAPS
150.0000 mg | ORAL_CAPSULE | Freq: Two times a day (BID) | ORAL | Status: DC
  Administered 2021-02-18 – 2021-02-24 (×12): 150 mg via ORAL
  Filled 2021-02-18 (×14): qty 1

## 2021-02-18 MED ORDER — METOPROLOL SUCCINATE ER 50 MG PO TB24
100.0000 mg | ORAL_TABLET | Freq: Every day | ORAL | Status: DC
  Administered 2021-02-19: 100 mg via ORAL
  Filled 2021-02-18: qty 2

## 2021-02-18 MED ORDER — FLEET ENEMA 7-19 GM/118ML RE ENEM
1.0000 | ENEMA | Freq: Once | RECTAL | Status: DC | PRN
Start: 2021-02-18 — End: 2021-02-24

## 2021-02-18 MED ORDER — GUAIFENESIN-DM 100-10 MG/5ML PO SYRP: 5.0000 mL | ORAL_SOLUTION | Freq: Four times a day (QID) | ORAL | Status: DC | PRN

## 2021-02-18 MED ORDER — PROCHLORPERAZINE MALEATE 5 MG PO TABS: 5.0000 mg | ORAL_TABLET | Freq: Four times a day (QID) | ORAL | Status: DC | PRN

## 2021-02-18 MED ORDER — POLYETHYLENE GLYCOL 3350 17 G PO PACK: 17.0000 g | PACK | Freq: Every day | ORAL | Status: DC | PRN

## 2021-02-18 MED ORDER — ROSUVASTATIN CALCIUM 20 MG PO TABS
40.0000 mg | ORAL_TABLET | Freq: Every day | ORAL | Status: DC
  Administered 2021-02-19 – 2021-02-24 (×6): 40 mg via ORAL
  Filled 2021-02-18 (×6): qty 2

## 2021-02-18 MED ORDER — ACETAMINOPHEN 325 MG PO TABS
325.0000 mg | ORAL_TABLET | ORAL | Status: DC | PRN
  Administered 2021-02-23 – 2021-02-24 (×2): 650 mg via ORAL
  Filled 2021-02-18 (×2): qty 2

## 2021-02-18 MED ORDER — APIXABAN 5 MG PO TABS
5.0000 mg | ORAL_TABLET | Freq: Two times a day (BID) | ORAL | Status: DC
  Administered 2021-02-18 – 2021-02-24 (×12): 5 mg via ORAL
  Filled 2021-02-18 (×12): qty 1

## 2021-02-18 MED ORDER — BUDESONIDE 0.5 MG/2ML IN SUSP
2.0000 mL | Freq: Two times a day (BID) | RESPIRATORY_TRACT | Status: DC
  Administered 2021-02-18 – 2021-02-24 (×11): 0.5 mg via RESPIRATORY_TRACT
  Filled 2021-02-18 (×13): qty 2

## 2021-02-18 MED ORDER — CHLORHEXIDINE GLUCONATE CLOTH 2 % EX PADS: 6.0000 | MEDICATED_PAD | Freq: Every day | CUTANEOUS | Status: DC

## 2021-02-18 MED ORDER — SOTALOL HCL 80 MG PO TABS
160.0000 mg | ORAL_TABLET | Freq: Two times a day (BID) | ORAL | Status: DC
  Administered 2021-02-18 – 2021-02-24 (×12): 160 mg via ORAL
  Filled 2021-02-18 (×14): qty 2

## 2021-02-18 MED ORDER — MONTELUKAST SODIUM 10 MG PO TABS
10.0000 mg | ORAL_TABLET | Freq: Every day | ORAL | Status: DC
  Administered 2021-02-19 – 2021-02-24 (×6): 10 mg via ORAL
  Filled 2021-02-18 (×6): qty 1

## 2021-02-18 MED ORDER — ROSUVASTATIN CALCIUM 40 MG PO TABS: 40.0000 mg | ORAL_TABLET | Freq: Every day | ORAL | Status: DC

## 2021-02-18 MED ORDER — INSULIN ASPART 100 UNIT/ML IJ SOLN
0.0000 [IU] | Freq: Three times a day (TID) | INTRAMUSCULAR | Status: DC
  Administered 2021-02-19: 2 [IU] via SUBCUTANEOUS
  Administered 2021-02-19 – 2021-02-20 (×3): 3 [IU] via SUBCUTANEOUS
  Administered 2021-02-21 (×2): 2 [IU] via SUBCUTANEOUS
  Administered 2021-02-21: 3 [IU] via SUBCUTANEOUS
  Administered 2021-02-22 – 2021-02-24 (×5): 2 [IU] via SUBCUTANEOUS

## 2021-02-18 MED ORDER — CLOPIDOGREL BISULFATE 75 MG PO TABS
75.0000 mg | ORAL_TABLET | Freq: Every day | ORAL | Status: DC
  Administered 2021-02-19 – 2021-02-24 (×6): 75 mg via ORAL
  Filled 2021-02-18 (×6): qty 1

## 2021-02-18 MED ORDER — EMPAGLIFLOZIN 25 MG PO TABS
25.0000 mg | ORAL_TABLET | Freq: Every day | ORAL | Status: DC
  Administered 2021-02-19 – 2021-02-24 (×6): 25 mg via ORAL
  Filled 2021-02-18 (×6): qty 1

## 2021-02-18 MED ORDER — ASPIRIN EC 81 MG PO TBEC
81.0000 mg | DELAYED_RELEASE_TABLET | Freq: Every day | ORAL | Status: DC
  Administered 2021-02-19 – 2021-02-24 (×6): 81 mg via ORAL
  Filled 2021-02-18 (×6): qty 1

## 2021-02-18 MED ORDER — BISACODYL 10 MG RE SUPP: 10.0000 mg | Freq: Every day | RECTAL | Status: DC | PRN

## 2021-02-18 MED ORDER — PROCHLORPERAZINE EDISYLATE 10 MG/2ML IJ SOLN: 5.0000 mg | Freq: Four times a day (QID) | INTRAMUSCULAR | Status: DC | PRN

## 2021-02-18 MED ORDER — ALUM & MAG HYDROXIDE-SIMETH 200-200-20 MG/5ML PO SUSP: 30.0000 mL | ORAL | Status: DC | PRN

## 2021-02-18 MED ORDER — CLOPIDOGREL BISULFATE 75 MG PO TABS: 75.0000 mg | ORAL_TABLET | Freq: Every day | ORAL | Status: DC

## 2021-02-18 NOTE — Discharge Summary (Addendum)
Discharge Summary    Patient ID: John [name] MRN: [id number]; DOB: 12/28/1950  Admit date: 02/14/2021 Discharge date: 02/18/2021  PCP:  [name], MD   [facility] Providers Cardiologist:  Shelva Majestic, MD  Electrophysiologist:  Constance Haw, MD   Discharge Diagnoses    Principal Problem:   ACS (acute coronary syndrome) [facility]) Active Problems:   CAD, multiple prior RCA PCI's. Last cath 2010, Myoview low risk Oct 2012   DM type 2, goal HbA1c < 7% (HCC)   HTN (hypertension)   COPD (chronic obstructive pulmonary disease) (HCC)   OSA on CPAP   Hyperlipidemia LDL goal <70   Former smoker   Obesity (BMI 30.0-34.9)   Chronic systolic CHF (congestive heart failure) (HCC)   History of ventricular tachycardia   NSTEMI (non-ST elevated myocardial infarction) ([place])   Left-sided neglect   Acute focal neurological deficit   Diagnostic Studies/Procedures    Left heart cath 02/15/21: ACS secondary to total occlusion of large distal RCA-PAV at previous stented segment followed by 80% stenosis distal to stent. (Difficult for successful PTCA/PCI using 3.5 mm ScoreFlex balloon multiple inflations with post dilation of the previously stented) overlapping Onyx Frontier DES 2.75 mm x 22 mm placed overlapping distally); prox RCA ~50%, prox-distal RCA 35%.  Initial triple drug therapy with plan discontinuance of aspirin and continuation of Plavix/for minimum of 6 to 12 months.   Near the completion of the procedure, the patient appeared slightly less responsive.  Blood sugar was obtained which was 63.  He was given one half amp of D50.  After the procedure, a subtle left facial droop developed and there appeared to be  left arm weakness..  A code stroke was activated and the patient was seen immediately by neurology and sent for head CT for further evaluation.   _____________   Echo 02/15/21: EF 40 to 45%.  Mildly decreased function in.  Impaired laxation.  Moderate LA dilation.   Slightly worsened inferior inferolateral hypokinesis.  Mild LA dilation.  Borderline aortic root dilation at 66mm    History of Present Illness     John [name] is a 70 y.o. male with a history of CAD with multiple prior PCIs dating back to 1996(fom notes prior multiple stents to RCA, priot stent to [place], HTN, OSA, aflutter, ventricular tachycardia with ICD followed by EP on beta blocker, mexilitene, and sotalol with prior  VT ablation 08/24/73, chronic systolic HF who is being seen 02/14/2021 for the evaluation of chest pain.  John [name] 70 yo male history of CAD with multiple prior PCIs dating back to 1996(fom notes prior multiple stents to RCA, priot stent to [place], HTN, OSA, aflutter, ventricular tachycardia with ICD followed by EP on beta blocker, mexilitene, and sotalol with prior  VT ablation 11/21/31, chronic systolic HF presents with chest pain.    Sudden onset of chest pain while at rest. Started left shoulder down into left arm, sharp/pressure. Progressed into left chest. Associated SOB. Improved with NG but quickly returned and progressed.     Physical Exam    BP 102/66 (BP Location: Left Arm)   Pulse 70   Temp (!) 97.4 F (36.3 C) (Oral)   Resp 18   Ht 6' (1.829 m)   Wt 94.2 kg   SpO2 95%   BMI 28.17 kg/m  General appearance: alert, cooperative, no distress, and appears comfortable sitting up in bed.  Eating lunch. Neck: no carotid bruit, no JVD, and supple, symmetrical, trachea midline  Lungs: clear to auscultation bilaterally and nonlabored, good air movement Heart: regular rate and rhythm, S1, S2 normal, no murmur, click, rub or gallop and normal apical impulse Extremities: No clubbing or cyanosis.  Trivial ankle edema on the left ankle with foot/ankle wrapped and toe bandaged.  5 out of 5 strength bilaterally. Pulses: 2+ and symmetric Neurologic: Grossly normal with the exception of mild vision issues.  Wearing eye patch.  Normal mood and affect.   K 4.2 Cr 0.88 BUN 14  WBC 9.8 Hgb 15.7 Plt 163 Ddimer 0.66 Trop peaked at 3076 EKG A paced, inferior and lateral precordial  Qwaves old   01/18/21 echo: LVEF 40-45%, grade I dd, noraml RV 08/2020 cath: Prox RCA to Dist RCA lesion is 15% stenosed. Ost RCA to Prox RCA lesion is 40% stenosed. Ramus lesion is 60% stenosed. Dist RCA lesion is 45% stenosed.  Hospital Course     Consultants: Neurology, CIR  NSTEMI CAD CE peaked at 43. He underwent heart cath as above with total occlusion of large distal RCA at the site of prior stenting. A difficult but successful PCI/DES x 1 to RPA V/PL system. Continue BB and entresto. He will complete 30 days of triple therapy then stop ASA.    Periprocedural CVA Near the completion of the PCI, pt had a change in responsiveness and left sided weakness with facial droop. CODE stroke was initiated and he was taken for stat head CT. He has left hemineglect and hemiparesis. CTA head negative for significant stenosis or occlusion. MRI positive for acute right thalamus infarct and small punctate areas of acute infarct in frontal lobes. Neurology agrees with the above triple therapy x 30 days then D/C ASA. PT/OT consulted and recommended CIR.    Lung nodule Plan for biopsy after ASA has been stopped, in 4-6 weeks. This will require readmission for heparin drip as a bridge, given recent stent. This will need to be coordinated with cardiology.  With enlarging nodule - will likely need biopsy for definitive Dx - unfortunately, he has just suffered 2 major events (MI - physiologically a STEMI although technically only NSTEMI by EKG criteria & Per-Procedural CVA with impressive improvement).  He is currently on Triple AC/Antiplatelet Rx with plans to continue at least 1 month -- then plan to d/c ASA & continue clopidogrel & apixiban. For Bx, would need to hold both agents  -  Apixiban x 48 hr & clopidogrel x 5 days (minimum) -- given recent MI & DES PCI - would be best if we could bridge with  GP 2b3a Inhibitor while off of Clopidogrel & IV Heparin vs. Enoxaparin while of of apixiban. -- will discuss options with [facility] team to determine best COA- but would prefer to wait at least 1 month (esp due to CVA  Ischemic cardiomyopathy ICD in place Continue GDMT: BB, entresto, jardiance. Appears euvolemic.    S/P VT ablation Atrial flutter Continue home mexilitine, sotalol, and eliquis.    DM A1c 7.6%. continue jardiance and insulin.    Pt seen and examined by Dr. Ellyn Hack and deemed stable for discharge. Follow up has been arranged.   Did the patient have an acute coronary syndrome (MI, NSTEMI, STEMI, etc) this admission?:  Yes                               AHA/ACC Clinical Performance & Quality Measures: Aspirin prescribed? - Yes ADP Receptor Inhibitor (Plavix/Clopidogrel, Brilinta/Ticagrelor or  Effient/Prasugrel) prescribed (includes medically managed patients)? - Yes Beta Blocker prescribed? - Yes High Intensity Statin (Lipitor 40-80mg  or Crestor 20-40mg ) prescribed? - Yes EF assessed during THIS hospitalization? - Yes For EF <40%, was ACEI/ARB prescribed? - Yes For EF <40%, Aldosterone Antagonist (Spironolactone or Eplerenone) prescribed? - No - Reason:  BP Cardiac Rehab Phase II ordered (including medically managed patients)? - Yes      _____________  Discharge Vitals Blood pressure 102/66, pulse 70, temperature (!) 97.4 F (36.3 C), temperature source Oral, resp. rate 18, height 6' (1.829 m), weight 94.2 kg, SpO2 95 %.  Filed Weights   02/14/21 1728 02/17/21 0133 02/18/21 0505  Weight: 98.4 kg 96.2 kg 94.2 kg    Labs & Radiologic Studies    CBC Recent Labs    02/15/21 2124 02/16/21 0153  WBC  --  7.4  HGB 14.3 14.6  HCT 42.0 44.8  MCV  --  93.3  PLT  --  834   Basic Metabolic Panel Recent Labs    02/15/21 2124 02/16/21 0153  NA 142 139  K 3.8 3.8  CL  --  112*  CO2  --  17*  GLUCOSE  --  102*  BUN  --  14  CREATININE  --  0.84  CALCIUM   --  8.4*   Liver Function Tests No results for input(s): AST, ALT, ALKPHOS, BILITOT, PROT, ALBUMIN in the last 72 hours. No results for input(s): LIPASE, AMYLASE in the last 72 hours. High Sensitivity Troponin:   Recent Labs  Lab 01/26/21 1235 01/26/21 1421 02/14/21 1727 02/14/21 1927 02/14/21 2237  TROPONINIHS 132* 124* 3,076* 2,458* 2,923*    BNP Invalid input(s): POCBNP D-Dimer No results for input(s): DDIMER in the last 72 hours. Hemoglobin A1C Recent Labs    02/17/21 0823  HGBA1C 7.6*   Fasting Lipid Panel No results for input(s): CHOL, HDL, LDLCALC, TRIG, CHOLHDL, LDLDIRECT in the last 72 hours. Thyroid Function Tests No results for input(s): TSH, T4TOTAL, T3FREE, THYROIDAB in the last 72 hours.  Invalid input(s): FREET3 _____________  CT CHEST WO CONTRAST  Result Date: 02/16/2021 CLINICAL DATA:  Lung nodule EXAM: CT CHEST WITHOUT CONTRAST TECHNIQUE: Multidetector CT imaging of the chest was performed following the standard protocol without IV contrast. COMPARISON:  08/15/2020 FINDINGS: Cardiovascular: Left fibrillator noted with proximal and distal leads in the right atrium and right ventricle, respectively. Coronary, aortic arch, and branch vessel atherosclerotic vascular disease. Mild cardiomegaly with fat density along the lateral wall the left ventricle possibly result of prior myocardial infarction. The ascending thoracic aorta measures 4.2 cm in diameter. Mediastinum/Nodes: Unremarkable Lungs/Pleura: Scattered chronically stable pulmonary nodules up to 0.6 cm in diameter observed. Along the upper margin of a prior tree-in-bud cluster of nodularity in the left upper lobe there is a dominant pulmonary nodule which has substantially enlarged compared to 07/26/2020, currently measuring 22 by 19 by 19 mm (volume = 4200 mm^3), and previously measuring 6 by 4 by 5 mm (volume = 60 mm^3). Upper Abdomen: Fluid density exophytic lesion from the right kidney upper pole compatible  with cyst. Musculoskeletal: Thoracic spondylosis with multilevel bridging spurring. IMPRESSION: 1. Along a cluster of tree-in-bud nodularity in the left upper lobe, a previous 60 cubic mm nodule currently measures 4200 cubic mm, averaging 20 mm in diameter. Although proximity to the tree-in-bud nodularity raises the possibility that this represents atypical infection such as MAI, the nodule growth is clearly different from the other nodules and raises the possibility of malignancy. Tissue  diagnosis is recommended when feasible. 2. Ascending thoracic aorta measures 4.2 cm in diameter, stable by my measurements. Recommend annual imaging followup by CTA or MRA. This recommendation follows 2010 ACCF/AHA/AATS/ACR/ASA/SCA/SCAI/SIR/STS/SVM Guidelines for the Diagnosis and Management of Patients with Thoracic Aortic Disease. Circulation. 2010; 121: W737-T062. Aortic aneurysm NOS (ICD10-I71.9) 3. Other imaging findings of potential clinical significance: Aortic Atherosclerosis (ICD10-I70.0). Coronary atherosclerosis. Mild cardiomegaly. Electronically Signed   By: Van Clines M.D.   On: 02/16/2021 18:57   MR BRAIN WO CONTRAST  Result Date: 02/16/2021 CLINICAL DATA:  Acute neuro deficit.  Suspect stroke. EXAM: MRI HEAD WITHOUT CONTRAST TECHNIQUE: Multiplanar, multiecho pulse sequences of the brain and surrounding structures were obtained without intravenous contrast. COMPARISON:  CT head 02/15/2021 FINDINGS: Brain: Acute infarct right medial thalamus measuring approximately 10 x 15 mm. Small areas of acute infarct in the frontal cortex bilaterally over the convexity and in the right parietal white matter. Mild atrophy without hydrocephalus. Negative for hemorrhage or mass. Vascular: Normal arterial flow voids Skull and upper cervical spine: No focal skeletal lesion. Sinuses/Orbits: Mucosal edema paranasal sinuses. Retention cyst right maxillary sinus. Bilateral cataract surgery Other: None IMPRESSION: Acute infarct  right thalamus. Small punctate areas of acute infarct in the frontal lobes bilaterally and right parietal white matter. Findings suggest cerebral emboli. Electronically Signed   By: Franchot Gallo M.D.   On: 02/16/2021 13:25   CARDIAC CATHETERIZATION  Result Date: 02/15/2021   1st Mrg lesion is 55% stenosed.   Prox RCA lesion is 50% stenosed.   Prox RCA to Dist RCA lesion is 35% stenosed.   RPAV-1 lesion is 100% stenosed.   RPAV-2 lesion is 80% stenosed.   A stent was successfully placed.   Post intervention, there is a 0% residual stenosis.   Post intervention, there is a 0% residual stenosis. Acute coronary syndrome secondary to total occlusion of a large distal right coronary artery at a site of prior distal stenting. The LAD has mild irregularity without significant disease.  The large ramus intermediate vessel has previously noted 55% very proximal stenosis not significantly changed.  The AV groove circumflex is a small vessel. The RCA is a very large vessel with diffuse stents proximally to very distally.  There is proximal 50% stenosis prior to the proximal to mid stent.  There is diffuse 35% narrowing within the long proximal stent.  The distal stent is totally occluded just after PDA vessel. Difficult but successful PCI requiring PTCA, a 3.5 x 15 mm Scorflex multiple dilatations throughout the previously placed tandem 3.5 mm  distal stents with ultimate noncompliant balloon dilatation to approximately 3.6 mm and insertion of a 2.75 x 22 mm Onyx Frontier new stent distal to the distal stent placed in overlap 75 to 80% stenosis beyond the stented segment, postdilated with a 3 oh balloon with residual narrowing of 0%. LVEDP 18 mmHg RECOMMENDATION: Initial triple drug therapy with plan discontinuance of aspirin and continuation of Plavix/for minimum of 6 to 12 months.  Near the completion of the procedure, the patient appeared slightly less responsive.  Blood sugar was obtained which was 63.  He was given  one half amp of D50.  After the procedure, a subtle left facial droop developed and there appeared to be  left arm weakness..  A code stroke was activated and the patient was seen immediately by neurology and sent for head CT for further evaluation.   MR FOOT LEFT WO CONTRAST  Result Date: 02/09/2021 CLINICAL DATA:  Foot swelling, diabetic, osteomyelitis  suspected, xray done EXAM: MRI OF THE LEFT FOOT WITHOUT CONTRAST TECHNIQUE: Multiplanar, multisequence MR imaging of the left foot was performed. No intravenous contrast was administered. COMPARISON:  Radiograph 02/08/2021 FINDINGS: Bones/Joint/Cartilage There is increased T2 signal with confluent low T1 signal in the middle and distal phalanx of the second toe. Hallux valgus with moderate first MTP degenerative change. Ligaments Intact Lisfranc ligament. Muscles and Tendons Diffuse muscle atrophy in the foot. Diffuse intramuscular edema as is commonly seen in diabetics. Soft tissues No soft tissue mass or focal fluid collection. Generalized soft tissue swelling of the foot, most prominent dorsally. IMPRESSION: Osteomyelitis of the second toe distal and middle phalanx with adjacent soft tissue ulcer. No evidence of soft tissue abscess. Electronically Signed   By: Maurine Simmering M.D.   On: 02/09/2021 14:17   DG Chest Portable 1 View  Result Date: 02/14/2021 CLINICAL DATA:  Acute chest pain EXAM: PORTABLE CHEST 1 VIEW COMPARISON:  01/26/2021, CT 07/26/2020, 01/17/2021 FINDINGS: Left-sided pacing device as before. Mild cardiomegaly without focal opacity, pleural effusion, or pneumothorax. Previously noted left midlung nodule for which chest CT was recommended is poorly visible on current exam. IMPRESSION: No active disease. Mild cardiomegaly. Previously noted left midlung nodule for which chest CT was recommended is poorly visible today likely due to technique and overlying pacer generator, CT follow-up again recommended. Electronically Signed   By: Donavan Foil  M.D.   On: 02/14/2021 18:17   DG Chest Portable 1 View  Result Date: 01/26/2021 CLINICAL DATA:  Chest pain EXAM: PORTABLE CHEST 1 VIEW COMPARISON:  01/17/2021 FINDINGS: Unchanged position of left chest device and leads. Unchanged cardiac and mediastinal contours. Low lung volumes with vascular crowding. The previously noted 2 cm nodule projecting over the left upper lung is less well seen on the present exam secondary to the position of the cardiac device, however it likely remains present. No pleural effusion or focal consolidative opacity. The visualized skeletal structures are unremarkable. IMPRESSION: 1. Visualization of a previously noted 2 cm nodule in the left mid hemithorax is limited by the position of the cardiac device. A chest CT is once again recommended. 2.  Low lung volumes with vascular crowding. Electronically Signed   By: Merilyn Baba MD   On: 01/26/2021 13:31   DG Foot Complete Left  Result Date: 02/10/2021 CLINICAL DATA:  Osteomyelitis, left second digit amputation EXAM: LEFT FOOT - COMPLETE 3+ VIEW COMPARISON:  02/08/2021 FINDINGS: Interval amputation of the left second digit distal to the MTP joint. Moderate hallux valgus deformity again identified with associated degenerative change of the MTP joint. No acute fracture or dislocation. No osseous erosions. Moderate midfoot degenerative arthritis noted. Moderate soft tissue swelling of the left forefoot noted. IMPRESSION: Interval left second digit amputation distal to the MTP joint. Associated soft tissue swelling. Electronically Signed   By: Fidela Salisbury M.D.   On: 02/10/2021 20:26   DG Foot Complete Left  Result Date: 02/08/2021 CLINICAL DATA:  Pain and edema EXAM: LEFT FOOT - COMPLETE 3+ VIEW COMPARISON:  None. FINDINGS: No acute fracture or dislocation. Changes of the midfoot and IP joints. No destructive osseous changes to suggest osteomyelitis. Soft tissue swelling of the forefoot IMPRESSION: No evidence of osteomyelitis.  Electronically Signed   By: Yetta Glassman M.D.   On: 02/08/2021 13:54   DG Foot Complete Left  Result Date: 02/04/2021 Please see detailed radiograph report in office note.  VAS [place] ABI WITH/WO TBI  Result Date: 02/09/2021  LOWER EXTREMITY DOPPLER STUDY  Patient Name:  John [name]  Date of Exam:   02/09/2021 Medical Rec #: [id number]         Accession #:    [id number] Date of Birth: 02/13/1951         Patient Gender: M Patient Age:   66 years Exam Location[facility]  [facility] Procedure:      VAS [place] ABI WITH/WO TBI Referring Phys: JARED GARDNER --------------------------------------------------------------------------------  Indications: Ulceration LT 2nd toe, worsening cellulitis. High Risk Factors: Hypertension, hyperlipidemia, Diabetes, past history of                    smoking, prior MI, coronary artery disease.  Comparison Study: No prior studies. Performing Technologist: Darlin Coco RDMS RVT  Examination Guidelines: A complete evaluation includes at minimum, Doppler waveform signals and systolic blood pressure reading at the level of bilateral brachial, anterior tibial, and posterior tibial arteries, when vessel segments are accessible. Bilateral testing is considered an integral part of a complete examination. Photoelectric Plethysmograph (PPG) waveforms and toe systolic pressure readings are included as required and additional duplex testing as needed. Limited examinations for reoccurring indications may be performed as noted.  ABI Findings: +---------+------------------+-----+---------+--------+ Right    Rt Pressure (mmHg)IndexWaveform Comment  +---------+------------------+-----+---------+--------+ Brachial 124                    triphasic         +---------+------------------+-----+---------+--------+ PTA      158               1.26 triphasic         +---------+------------------+-----+---------+--------+ DP       145               1.16 triphasic          +---------+------------------+-----+---------+--------+ Great Toe78                0.62 Abnormal          +---------+------------------+-----+---------+--------+ +---------+------------------+-----+---------+-------+ Left     Lt Pressure (mmHg)IndexWaveform Comment +---------+------------------+-----+---------+-------+ Brachial 125                    triphasic        +---------+------------------+-----+---------+-------+ PTA      139               1.11 triphasic        +---------+------------------+-----+---------+-------+ DP       149               1.19 triphasic        +---------+------------------+-----+---------+-------+ Great Toe88                0.70 Normal           +---------+------------------+-----+---------+-------+  Summary: Right: Resting right ankle-brachial index is within normal range. No evidence of significant right lower extremity arterial disease. The right toe-brachial index is abnormal. Left: Resting left ankle-brachial index is within normal range. No evidence of significant left lower extremity arterial disease. The left toe-brachial index is normal.  *See table(s) above for measurements and observations.  Electronically signed by Harold Barban MD on 02/09/2021 at 8:07:27 PM.    Final    ECHOCARDIOGRAM COMPLETE  Result Date: 02/15/2021    ECHOCARDIOGRAM REPORT   Patient Name:   John [name] Date of Exam: 02/15/2021 Medical Rec #:  [id number]        Height:       72.0 in Accession #:  [id number]       Weight:       217.0 lb Date of Birth:  December 08, 1950        BSA:          2.206 m Patient Age:    45 years         BP:           119/91 mmHg Patient Gender: M                HR:           72 bpm. Exam Location:  Inpatient Procedure: 2D Echo, Cardiac Doppler and Color Doppler Indications:    chest pain  History:        Patient has prior history of Echocardiogram examinations, most                 recent 01/18/2021. CAD and Previous Myocardial Infarction,                  Arrythmias:Atrial Flutter, Signs/Symptoms:Shortness of Breath;                 Risk Factors:Diabetes, Dyslipidemia and Hypertension. 02/27/2012                 left heart cath                 07/28/2016 cardionversion                 03/02/2020 left heasrt cath                 08/19/20 left heart cath.  Sonographer:    Luisa Hart RDCS Referring Phys: [id number] Hana  1. Left ventricular ejection fraction, by estimation, is 40 to 45%. The left ventricle has mildly decreased function. The left ventricle demonstrates regional wall motion abnormalities (see scoring diagram/findings for description). The left ventricular  internal cavity size was moderately dilated. Left ventricular diastolic parameters are consistent with Grade I diastolic dysfunction (impaired relaxation).  2. Right ventricular systolic function is normal. The right ventricular size is normal. There is normal pulmonary artery systolic pressure.  3. Left atrial size was mildly dilated.  4. The mitral valve is normal in structure. Trivial mitral valve regurgitation. No evidence of mitral stenosis.  5. The aortic valve is normal in structure. Aortic valve regurgitation is not visualized.  6. Aortic dilatation noted. There is borderline dilatation of the aortic root, measuring 38 mm. Comparison(s): Prior images reviewed side by side. The left ventricular function is unchanged. The left ventricular wall motion abnormalities are worse, although the general distribution is similar. There seems to be more some worsening of contractility in the more distal portion of the inferior and inferoseptal walls, with sparing of the true apex and without meaningful change in overall LVEF. FINDINGS  Left Ventricle: Left ventricular ejection fraction, by estimation, is 40 to 45%. The left ventricle has mildly decreased function. The left ventricle demonstrates regional wall motion abnormalities. The left ventricular internal cavity size was  moderately dilated. There is no left ventricular hypertrophy. Left ventricular diastolic parameters are consistent with Grade I diastolic dysfunction (impaired relaxation). Normal left ventricular filling pressure.  LV Wall Scoring: The basal inferolateral segment and basal inferior segment are dyskinetic. The mid and distal inferior wall, mid inferolateral segment, and basal inferoseptal segment are akinetic. The mid inferoseptal segment is hypokinetic. The entire anterior wall, antero-lateral wall, entire anterior septum, apical lateral segment, and apex are normal.  Right Ventricle: The right ventricular size is normal. No increase in right ventricular wall thickness. Right ventricular systolic function is normal. There is normal pulmonary artery systolic pressure. The tricuspid regurgitant velocity is 1.97 m/s, and  with an assumed right atrial pressure of 3 mmHg, the estimated right ventricular systolic pressure is 58.5 mmHg. Left Atrium: Left atrial size was mildly dilated. Right Atrium: Right atrial size was normal in size. Pericardium: There is no evidence of pericardial effusion. Mitral Valve: The mitral valve is normal in structure. Trivial mitral valve regurgitation. No evidence of mitral valve stenosis. Tricuspid Valve: The tricuspid valve is normal in structure. Tricuspid valve regurgitation is trivial. Aortic Valve: The aortic valve is normal in structure. Aortic valve regurgitation is not visualized. Aortic valve mean gradient measures 3.0 mmHg. Aortic valve peak gradient measures 4.5 mmHg. Aortic valve area, by VTI measures 5.19 cm. Pulmonic Valve: The pulmonic valve was normal in structure. Pulmonic valve regurgitation is not visualized. Aorta: Aortic dilatation noted. There is borderline dilatation of the aortic root, measuring 38 mm. IAS/Shunts: No atrial level shunt detected by color flow Doppler. Additional Comments: A device lead is visualized in the right ventricle.  LEFT VENTRICLE PLAX 2D  LVIDd:         6.20 cm      Diastology LVIDs:         5.70 cm      LV e' medial:    4.03 cm/s LV PW:         0.80 cm      LV E/e' medial:  13.5 LV IVS:        1.00 cm      LV e' lateral:   4.38 cm/s LVOT diam:     2.80 cm      LV E/e' lateral: 12.4 LV SV:         106 LV SV Index:   48 LVOT Area:     6.16 cm  LV Volumes (MOD) LV vol d, MOD A2C: 112.0 ml LV vol d, MOD A4C: 142.0 ml LV vol s, MOD A2C: 75.1 ml LV vol s, MOD A4C: 71.1 ml LV SV MOD A2C:     36.9 ml LV SV MOD A4C:     142.0 ml LV SV MOD BP:      53.3 ml RIGHT VENTRICLE RV Basal diam:  2.60 cm RV Mid diam:    1.30 cm RV S prime:     11.00 cm/s TAPSE (M-mode): 1.7 cm LEFT ATRIUM             Index       RIGHT ATRIUM           Index LA diam:        3.80 cm 1.72 cm/m  RA Area:     16.00 cm LA Vol (A2C):   43.4 ml 19.68 ml/m RA Volume:   39.80 ml  18.04 ml/m LA Vol (A4C):   52.6 ml 23.85 ml/m LA Biplane Vol: 47.5 ml 21.53 ml/m  AORTIC VALVE                   PULMONIC VALVE AV Area (Vmax):    5.23 cm    PV Vmax:       0.79 m/s AV Area (Vmean):   4.94 cm    PV Vmean:      57.700 cm/s AV Area (VTI):     5.19 cm    PV VTI:  0.148 m AV Vmax:           106.00 cm/s PV Peak grad:  2.5 mmHg AV Vmean:          77.500 cm/s PV Mean grad:  2.0 mmHg AV VTI:            0.204 m AV Peak Grad:      4.5 mmHg AV Mean Grad:      3.0 mmHg LVOT Vmax:         90.10 cm/s LVOT Vmean:        62.200 cm/s LVOT VTI:          0.172 m LVOT/AV VTI ratio: 0.84  AORTA Ao Root diam: 3.80 cm MITRAL VALVE               TRICUSPID VALVE MV Area (PHT): 2.53 cm    TR Peak grad:   15.5 mmHg MV Decel Time: 300 msec    TR Vmax:        197.00 cm/s MV E velocity: 54.40 cm/s MV A velocity: 82.70 cm/s  SHUNTS MV E/A ratio:  0.66        Systemic VTI:  0.17 m                            Systemic Diam: 2.80 cm Dani Gobble Croitoru MD Electronically signed by Sanda Klein MD Signature Date/Time: 02/15/2021/10:55:04 AM    Final    CUP PACEART INCLINIC DEVICE CHECK  Result Date: 02/01/2021 ICD check  in clinic. Normal device function. Thresholds and sensing consistent with previous device measurements. Impedance trends stable over time. No mode switches. No new ventricular arrhythmias. Histogram distribution appropriate for patient and level of activity. No changes made this session. Device programmed at appropriate safety margins. Device programmed to optimize intrinsic conduction. Estimated longevity 9 yr, 13mo. Pt enrolled in remote follow-up. Patient education completed  CT HEAD CODE STROKE WO CONTRAST  Result Date: 02/15/2021 CLINICAL DATA:  Code stroke.  Neuro deficit, acute, stroke suspected EXAM: CT HEAD WITHOUT CONTRAST TECHNIQUE: Contiguous axial images were obtained from the base of the skull through the vertex without intravenous contrast. COMPARISON:  None. FINDINGS: Brain: No evidence of acute large vascular territory infarction, hemorrhage, hydrocephalus, extra-axial collection or mass lesion/mass effect. Intracranial enhancement is noted, likely related to recent cardiac catheterization. Vascular: Contrast within vessels, likely related to recent cardiac catheterization. Skull: No acute fracture. Sinuses/Orbits: No acute findings. Other: No mastoid effusion. ASPECTS [facility] Stroke Program Early CT Score) Total score (0-10 with 10 being normal): 10. IMPRESSION: No evidence of acute intracranial abnormality.  ASPECTS is 10. Code stroke imaging results were communicated on 02/15/2021 at 6:47 pm to provider [facility] via secure text paging. Electronically Signed   By: Margaretha Sheffield M.D.   On: 02/15/2021 18:48   CT ANGIO HEAD NECK W WO CM W PERF (CODE STROKE)  Result Date: 02/15/2021 CLINICAL DATA:  Left-sided neglect EXAM: CT ANGIOGRAPHY HEAD AND NECK TECHNIQUE: Multidetector CT imaging of the head and neck was performed using the standard protocol during bolus administration of intravenous contrast. Multiplanar CT image reconstructions and MIPs were obtained to evaluate the vascular anatomy.  Carotid stenosis measurements (when applicable) are obtained utilizing NASCET criteria, using the distal internal carotid diameter as the denominator. CONTRAST:  116mL OMNIPAQUE IOHEXOL 350 MG/ML SOLN COMPARISON:  Same day noncontrast head CT, brain MRI 11/02/2014 FINDINGS: CTA NECK FINDINGS Aortic arch: There is minimal calcified atherosclerotic plaque of  the aortic arch and at the origins of the great vessels. Right carotid system: No evidence of dissection, stenosis (50% or greater) or occlusion. Left carotid system: No evidence of dissection, stenosis (50% or greater) or occlusion. Vertebral arteries: Codominant. No evidence of dissection, stenosis (50% or greater) or occlusion. Skeleton: There is multilevel degenerative change of the cervical spine. There is no acute osseous abnormality or aggressive osseous lesion. Other neck: There is a right maxillary sinus mucous retention cyst. Upper chest: There is mosaic attenuation in the lung apices with interlobular septal thickening. There is a 1.6 cm lesion in the left upper lobe, incompletely imaged. Review of the MIP images confirms the above findings CTA HEAD FINDINGS Anterior circulation: There is mild calcification of the cavernous ICAs without hemodynamically significant stenosis or occlusion. The bilateral ACAs and MCAs are patent. There is no aneurysm. Posterior circulation: The V4 segments of the vertebral arteries are patent. The basilar artery is patent. The PCAs are patent. Venous sinuses: As permitted by contrast timing, patent. Anatomic variants: None. Review of the MIP images confirms the above findings IMPRESSION: 1. Patent vasculature of the head and neck with no hemodynamically significant stenosis, occlusion, dissection, or aneurysm. 2. Soft tissue lesion in the left upper lobe concerning for malignancy, incompletely imaged. Recommend dedicated CT chest. 3. Mild pulmonary interstitial edema and mosaic attenuation suggesting small airway disease.  Electronically Signed   By: Valetta Mole M.D.   On: 02/15/2021 19:04   Disposition   Pt is being discharged home today in good condition.  Follow-up Plans & Appointments     Follow-up Information     Led[facility] Bottcher, [place] Follow up on 03/07/2021.   Specialties: Physician Assistant, Cardiology, Radiology Why: 2:45 am Contact information: [address] [place] [address] [phone]                Discharge Instructions     Diet - low sodium heart healthy   Complete by: As directed    Discharge instructions   Complete by: As directed    No driving until cleared by neurology. No lifting over 5 lbs for 1 week. No sexual activity for 1 week. Keep procedure site clean & dry. If you notice increased pain, swelling, bleeding or pus, call/return!  You may shower, but no soaking baths/hot tubs/pools for 1 week.   Discharge wound care:   Complete by: As directed    As in instructions   Increase activity slowly   Complete by: As directed        Discharge Medications   Allergies as of 02/18/2021       Reactions   Omega-3 Fatty Acids Hives, Itching   Benazepril Other (See Comments)   hyperkalemia   Fish Allergy Itching        Medication List     STOP taking these medications    cephALEXin 500 MG capsule Commonly known as: KEFLEX       TAKE these medications    acetaminophen 500 MG tablet Commonly known as: TYLENOL Take 500 mg by mouth every 6 (six) hours as needed for moderate pain.   albuterol 108 (90 Base) MCG/ACT inhaler Commonly known as: VENTOLIN HFA Inhale 2 puffs into the lungs every 6 (six) hours as needed for wheezing or shortness of breath.   Arnuity Ellipta 200 MCG/ACT Aepb Generic drug: Fluticasone Furoate Inhale 1 puff into the lungs daily.   aspirin 81 MG EC tablet Take 1 tablet (81 mg total) by mouth  daily. Swallow whole. Start taking on: February 19, 2021   clopidogrel 75 MG tablet Commonly known as: PLAVIX Take 1  tablet (75 mg total) by mouth daily with breakfast. Start taking on: February 19, 2021   Eliquis 5 MG Tabs tablet Generic drug: apixaban TAKE 1 TABLET BY MOUTH TWICE A DAY What changed: how much to take   Entresto 24-26 MG Generic drug: sacubitril-valsartan TAKE 1 TABLET BY MOUTH TWICE A DAY   HYDROcodone-acetaminophen 5-325 MG tablet Commonly known as: NORCO/VICODIN Take 1 tablet by mouth every 6 (six) hours as needed for severe pain.   Jardiance 25 MG Tabs tablet Generic drug: empagliflozin TAKE 1 TABLET BY MOUTH EVERY DAY What changed: how much to take   ketoconazole 2 % shampoo Commonly known as: NIZORAL Apply 1 application topically 2 (two) times a week.   Levemir FlexTouch 100 UNIT/ML FlexPen Generic drug: insulin detemir INJECT 46 UNITS INTO THE SKIN DAILY. DX:E11.9 What changed: See the new instructions.   metoprolol succinate 100 MG 24 hr tablet Commonly known as: TOPROL-XL Take 1 tablet (100 mg total) by mouth daily. Take with or immediately following a meal. What changed: additional instructions   mexiletine 150 MG capsule Commonly known as: MEXITIL Take 2 capsules (300 mg total) by mouth every 12 (twelve) hours. What changed:  how much to take when to take this   montelukast 10 MG tablet Commonly known as: SINGULAIR TAKE 1 TABLET BY MOUTH EVERY DAY   nitroGLYCERIN 0.4 MG SL tablet Commonly known as: NITROSTAT PLACE 1 TABLET (0.4 MG TOTAL) UNDER THE TONGUE EVERY 5 (FIVE) MINUTES AS NEEDED FOR CHEST PAIN.   NovoFine 32G X 6 MM Misc Generic drug: Insulin Pen Needle 1 each by Other route daily.   NovoLOG FlexPen 100 UNIT/ML FlexPen Generic drug: insulin aspart INJECT 5-20 UNITS SUBCUTANEOUSLY WITH LUNCH AND DINNER What changed:  how much to take how to take this when to take this reasons to take this additional instructions   OneTouch Ultra test strip Generic drug: glucose blood USE TO CHECK BLOOD SUGAR 3 TIMES A DAY (E11.9)   rosuvastatin  40 MG tablet Commonly known as: CRESTOR Take 1 tablet (40 mg total) by mouth daily. What changed:  medication strength how much to take   sotalol 160 MG tablet Commonly known as: BETAPACE Take 1 tablet (160 mg total) by mouth 2 (two) times daily.               Discharge Care Instructions  (From admission, onward)           Start     Ordered   02/18/21 0000  Discharge wound care:       Comments: As in instructions   02/18/21 1226               Outstanding Labs/Studies   CIR  Duration of Discharge Encounter   Greater than 30 minutes including physician time.  Signed, Tami Lin Duke, PA 02/18/2021, 12:27 PM  ATTENDING ATTESTATION  I have seen, examined and evaluated the patient this AM on rounds.  I personally performed the physical examination and evaluated patient today.  Reviewed all available data and then discussed the above discharge summary with Fabian Sharp, PA-C.  The findings, examination as well as impression recommendations are per my evaluation and discussion with the PA.  Attending adjustments noted in italics.   He is doing well and stable from a cardiac and neurologic standpoint.  He has been found to be unstable  gait, and physical therapy/Occupational Therapy have recommended CIR discharge for inpatient rehab.  He is stable for discharge today. I have discussed with Dr. Claiborne Billings as well as neurology team will pass on to Dr.Icard the information about the pulmonary nodule.  This will require close coordination between pulmonary medicine and cardiology as he will likely need to be admitted to bridge Plavix and Xarelto for biopsy procedure.  This will need to be delayed a minimum of 1 month.  He has been followed by [facility] to follow-up dressing changes from his recent surgery.  He will need to be followed while on CIR.   Glenetta Hew, M.D., M.S. Interventional Cardiologist   Pager # [phone] Phone #  [phone] [address] [place], [place] [address]

## 2021-02-18 NOTE — Progress Notes (Signed)
Physical Therapy Treatment Patient Details Name: John [name] MRN: [id number] DOB: 06-13-1951 Today's Date: 02/18/2021    History of Present Illness Pt presented to the ED 8/29 with chest pain. Pt with NSTEMI. Pt underwent cardiac cath 8/30 and post procedure developed lt sided weakness, lt neglect, and rt gaze preference. MRI: Acute infarct right thalamus. Small punctate areas of acute infarct  in the frontal lobes bilaterally and right parietal white matter.  Findings suggest cerebral emboli. 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    PT Comments    Pt is making good progress towards his PT goals. Pt was able to ambulate with a RW with up to minA and perform transfers with up to [place] today. He continues to display a L lateral and posterior bias that place him at risk for falls though. Completed session with standing exercises with UE support. Current recommendations remain appropriate.    Follow Up Recommendations  CIR     Equipment Recommendations  Rolling walker with 5" wheels;Wheelchair (measurements PT);Wheelchair cushion (measurements PT)    Recommendations for Other Services       Precautions / Restrictions Precautions Precautions: Fall Precaution Comments: L foot drop at baseline Restrictions Weight Bearing Restrictions: No    Mobility  Bed Mobility Overal bed mobility: Needs Assistance Bed Mobility: Supine to Sit;Sit to Supine     Supine to sit: HOB elevated;Supervision Sit to supine: Supervision;HOB elevated   General bed mobility comments: Extra time with pt using bed rails with HOB elevated to perform bed mobility with supervision for safety.    Transfers Overall transfer level: Needs assistance Equipment used: Rolling walker (2 wheeled) Transfers: Sit to/from Stand Sit to Stand:  Min guard;Min assist         General transfer comment: Pt with L lateral lean and posterior lean with transfer to stand, displaying instability and occasional LOB needing up to minA to recover, x11 reps from EOB. Cues provided to lean to R, scoot hips to edge, flex trunk anteriorly to gain momentum and reduce posterior lean, and placement of hands, good carryover of multiple steps noted with subsequent reps.  Ambulation/Gait Ambulation/Gait assistance: Min assist;Min guard Gait Distance (Feet): 80 Feet Assistive device: Rolling walker (2 wheeled) Gait Pattern/deviations: Step-through pattern;Decreased step length - left;Steppage;Decreased dorsiflexion - left;Narrow base of support Gait velocity: decr Gait velocity interpretation: <1.31 ft/sec, indicative of household ambulator General Gait Details: Hx of steppage gait on L. Pt with L lateral lean, needing tactile cues to correct. Cues provided to widen stance, particularly when turning. Min guard-minA to ambulate with RW at slow pace with mild unsteadiness noted. Unable to change head positions while ambulating, pt insists on stopping if he changes head positions due to him reporting it impacts his balance.   Stairs             Wheelchair Mobility    Modified Rankin (Stroke Patients Only) Modified Rankin (Stroke Patients Only) Pre-Morbid Rankin Score: No symptoms Modified Rankin: Moderately severe disability     Balance Overall balance assessment: Needs assistance Sitting-balance support: Feet supported;No upper extremity supported Sitting balance-Leahy Scale: Good     Standing balance support: Bilateral upper extremity supported;Single extremity supported Standing balance-Leahy Scale: Poor Standing balance comment: Needs up to 1 UE support and min guard-minA for standing balance.  Cognition Arousal/Alertness: Awake/alert Behavior During Therapy: Impulsive (mildly) Overall Cognitive  Status: Impaired/Different from baseline Area of Impairment: Memory;Safety/judgement;Awareness;Problem solving                     Memory: Decreased short-term memory Following Commands: Follows one step commands consistently;Follows one step commands with increased time;Follows multi-step commands with increased time Safety/Judgement: Decreased awareness of safety;Decreased awareness of deficits Awareness: Emergent Problem Solving: Requires verbal cues;Difficulty sequencing;Slow processing General Comments: Pt more aware of his deficits and understanding he is unsafe to get up alone. However, remains mildly impulsive, needing cues to pause and wait for PT to prepare environment prior to preceeding with movement on occasion. Pt able to tolerate applying multiple cues at once with tasks. Requires reminders for positioning and plan for session.      Exercises General Exercises - Lower Extremity Hip ABduction/ADduction: Both;10 reps;Standing (with bil UEs on RW) Hip Flexion/Marching: Both;10 reps;Standing (with 1 UE support and minA) Other Exercises Other Exercises: Sit to stand 10x from EOB using UEs and up to minA Other Exercises: Gentle passive stretch to bil upper traps and levator scaps 1x ~20-30 sec each    General Comments        Pertinent Vitals/Pain Pain Assessment: No/denies pain    Home Living Family/patient expects to be discharged to:: Inpatient rehab                    Prior Function            PT Goals (current goals can now be found in the care plan section) Acute Rehab PT Goals Patient Stated Goal: Return to independence PT Goal Formulation: With patient Time For Goal Achievement: 02/24/21 Potential to Achieve Goals: Good Progress towards PT goals: Progressing toward goals    Frequency    Min 4X/week      PT Plan Current plan remains appropriate    Co-evaluation              AM-PAC PT "6 Clicks" Mobility   Outcome Measure   Help needed turning from your back to your side while in a flat bed without using bedrails?: None Help needed moving from lying on your back to sitting on the side of a flat bed without using bedrails?: A Little Help needed moving to and from a bed to a chair (including a wheelchair)?: A Little Help needed standing up from a chair using your arms (e.g., wheelchair or bedside chair)?: A Little Help needed to walk in hospital room?: A Little Help needed climbing 3-5 steps with a railing? : A Lot 6 Click Score: 18    End of Session Equipment Utilized During Treatment: Gait belt Activity Tolerance: Patient tolerated treatment well Patient left: in bed;with call bell/phone within reach;with bed alarm set Nurse Communication: Mobility status PT Visit Diagnosis: Unsteadiness on feet (R26.81);Other abnormalities of gait and mobility (R26.89);Muscle weakness (generalized) (M62.81);Difficulty in walking, not elsewhere classified (R26.2);Other symptoms and signs involving the nervous system (R29.898)     Time: 4431-5400 PT Time Calculation (min) (ACUTE ONLY): 24 min  Charges:  $Gait Training: 8-22 mins $Therapeutic Exercise: 8-22 mins                     Moishe Spice, PT, DPT Acute Rehabilitation Services  Pager: [phone] Office: [place] 02/18/2021, 2:20 PM

## 2021-02-18 NOTE — Progress Notes (Signed)
INPATIENT REHABILITATION ADMISSION NOTE   Arrival Method: wheelchair     Mental Orientation: Alert and oriented x4   Assessment: see flowsheet   Skin: see flowsheet   IV'S: N/A   Pain: 0/10   Tubes and Drains: N/A   Safety Measures: belt alarm, call bell in reach, recliner locked   Vital Signs: see flowsheet   Height and Weight: see flowsheet   Rehab Orientation: performed/explained   Family: not at bedside at this time

## 2021-02-18 NOTE — Progress Notes (Signed)
Inpatient Rehabilitation Admissions Coordinator   Inpatient rehab consult received . I met with patient at bedside to discuss goals, expectations and cost of care for a CIR admit .He is in agreement. I also spoke with his wife and daughter by phone to review CIR expectations.  Bed is available today. I have alerted Dr Ellyn Hack, acute team and TOC. I will make the arrangements to admit today.  Danne Baxter, RN, MSN Rehab Admissions Coordinator [phone] 02/18/2021 11:00 AM

## 2021-02-18 NOTE — Progress Notes (Signed)
Inpatient Rehabilitation Medication Review by a Pharmacist  A complete drug regimen review was completed for this patient to identify any potential clinically significant medication issues.  High Risk Drug Classes Is patient taking? Indication by Medication  Antipsychotic Yes Compazine for nausea  Anticoagulant Yes Eliquis for afib and CVA  Antibiotic Yes Keflex for osteo. Ends 92/  Opioid Yes Hydrocodone for severe pain  Antiplatelet Yes ASA 81mg  and Plavix 30d for CVA  Hypoglycemics/insulin Yes SSI, Jardiance,   Vasoactive Medication Yes Toprol, Mexiletin, Entresto, Sotalol for afib and HF  Chemotherapy No   Other No      Type of Medication Issue Identified Description of Issue Recommendation(s)  Drug Interaction(s) (clinically significant)     Duplicate Therapy     Allergy     No Medication Administration End Date  No stop date on Kelfex No stop date on Plavix Treatment ends 9/2. Stop date added Add 30d stop date to Plavix  Incorrect Dose     Additional Drug Therapy Needed     Significant med changes from prior encounter (inform family/care partners about these prior to discharge). Levemir, Ketaconazole shampoo, Arnuity Ellipta not resumed Resume insulin therapy as dictated by CBGs  Other       Clinically significant medication issues were identified that warrant physician communication and completion of prescribed/recommended actions by midnight of the next day:  No  Time spent performing this drug regimen review (minutes):  10-36min  Adalie Mand S. Alford Highland, PharmD, BCPS Clinical Staff Pharmacist [email or web address] [place] 02/18/2021 2:09 PM

## 2021-02-18 NOTE — H&P (Signed)
Physical Medicine and Rehabilitation Admission H&P    Chief Complaint  Patient presents with   Functional deficits due to stroke.    HPI: John [name] is a 70 year old RH-male with history of HTN, T2DM, GERD, lumbar surgery w/L-foot drop, PAF-on Eliquis, VT s/p ICD/recent cardioversion, CAD s/p PTCI, infected left 2nd toe ulcer with oste s/p amputation 14/97, chronic systolic/diastolic History taken from patient and chart review. Discussed with Cards as well- significant improvement in LUE strength. CHF who was admitted on 02/14/2021 with onset of chest pain radiating to Left arm. He had elevated troponins due to NSTEMI and was started on IV heparin/NTG and taken to cath lab on 02/15/21 for cath with PTCA/PCI of instent thrombosis w/occlusion of proximal PL. Near completion of procedure, patient noted to be lethargic with hypoglycemia, BS 63, had left facial droop with left  neglect, left gaze preference and right sided weakness. CTA head/neck patent vasculature without LVO, occlusion or aneurysm as well as soft tissue lesion LUL concerning for malignancy.  He was not felt to be a tPA candidate as had received heparin and Plavix during procedure.  Repeat Echocardiogram showed EF of 40-45% with mildly decreased function and moderately dilated LV cavity. Follow-up MRI brain showed acute infarct in right thalamus, small punctate areas in bilateral frontal lobe and right parietal white matter suggestive of cerebral emboli. Dr. Leonie Man felt that patient had periprocederal embolic stroke and recommends triple therapy of Eliquis, ASA, Plavix for one month followed by aspirin and Eliquis thereafter.  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  Dr. Raeanne Barry consulted and  patient to follow-up with Dr. Valeta Harms.Podiatry has lowed up for evaluation of amp site and recommends follow up with Dr. Amalia Hailey after discharge for suture removal. PT/OT evaluations completed showing poor safety awareness with lack of insight into deficits, balance deficits with lateral lean, difficulty with high-level tasks as well as reports of double vision with dizziness.  CIR was recommended due to functional decline. Please see preadmission assessment form earlier today as well.   Review of Systems  Constitutional:  Negative for chills and fever.  HENT:  Negative for hearing loss.   Eyes:  Positive for double vision. Negative for blurred vision.  Respiratory:  Positive for cough (chronic--vocal cord issues?). Negative for shortness of breath.   Cardiovascular:  Negative for chest pain and palpitations.  Gastrointestinal:  Negative for abdominal pain, heartburn and nausea.  Genitourinary:  Negative for dysuria and urgency.  Musculoskeletal:  Positive for joint pain. Negative for myalgias and neck pain.  Skin:  Negative for rash.  Neurological:  Positive for dizziness and weakness. Negative for headaches.  Psychiatric/Behavioral:  The patient does not have insomnia.   All other systems reviewed and are negative.   Past Medical History:  Diagnosis Date   Atrial flutter ([place])    s/p cardioversion   Coronary artery disease    Diabetes mellitus    GERD (gastroesophageal reflux disease)    History of nuclear stress test 04/04/2011   lexiscan; mod-large in size fixed inferolateral defect (scar); non-diagnostic for ischemia; low risk scan    Hyperlipidemia    Hypertension    Left foot drop    r/t past disk srugery - uses Kevlar brace   Myocardial infarction ([place])    posterior  MI   Shortness of breath    Sleep apnea    on CPAP; 04/28/2007 split-night - AHI during total sleep 44.43/hr and REM 72.56/hr    Past Surgical History:  Procedure Laterality Date   AMPUTATION TOE Left 02/10/2021    Procedure: AMPUTATION  LEFT SECOND TOE;  Surgeon: Edrick Kins, DPM;  Location: [facility];  Service: Podiatry;  Laterality: Left;   [place]  2010   6 stents total   CARDIAC CATHETERIZATION  01/2000   percutaneous transluminal coronary balloon angioplasty of mid RCA stenotic lesion   CARDIAC CATHETERIZATION  06/2006   no stenting; ischemic cardiomyopathy, EF 40-45%   CARDIOVERSION N/A 07/28/2016   Procedure: CARDIOVERSION;  Surgeon: Troy Sine, MD;  Location: [facility];  Service: Cardiovascular;  Laterality: N/A;   CORONARY ANGIOPLASTY  09/1998   mid-distal RCA balloon dilatation, 4.5 & 5.0 stents    CORONARY ANGIOPLASTY WITH STENT PLACEMENT  03/1994   angioplasty & stenting (non-DES) of circumflex/prox ramus intermedius   CORONARY ANGIOPLASTY WITH STENT PLACEMENT  10/1994   large iliac [id number] stent to RCA   [place]  12/2002   4.75mm stents x2 of RCA   CORONARY ANGIOPLASTY WITH STENT PLACEMENT  01/2005   cutting balloon arthrectomy of distal RCA & Cypher DES 3.5x13; cutting balloon arthrectomy of mid RCA with Cypher DES 3.5x18   CORONARY ANGIOPLASTY WITH STENT PLACEMENT  11/2008   stenting of mid RCA with 4.0x15mm driver, non-DES   CORONARY BALLOON ANGIOPLASTY N/A 02/15/2021   Procedure: CORONARY BALLOON ANGIOPLASTY;  Surgeon: Troy Sine, MD;  Location: [facility];  Service: Cardiovascular;  Laterality: N/A;   ICD IMPLANT N/A 08/20/2020   Procedure: ICD IMPLANT;  Surgeon: Constance Haw, MD;  Location: [facility];  Service: Cardiovascular;  Laterality: N/A;   INTRAVASCULAR PRESSURE WIRE/FFR STUDY N/A 03/02/2020   Procedure: INTRAVASCULAR PRESSURE WIRE/FFR STUDY;  Surgeon: Leonie Man, MD;  Location: [place] [facility];  Service: Cardiovascular;  Laterality: N/A;   LEFT HEART CATH AND CORONARY ANGIOGRAPHY N/A 03/02/2020   Procedure: LEFT HEART CATH AND CORONARY ANGIOGRAPHY;  Surgeon: Leonie Man, MD;  Location: [facility];  Service: Cardiovascular;  Laterality: N/A;   LEFT HEART CATH AND CORONARY ANGIOGRAPHY N/A 08/19/2020   Procedure: LEFT HEART CATH AND CORONARY ANGIOGRAPHY;  Surgeon: Lorretta Harp, MD;  Location: [facility];  Service: Cardiovascular;  Laterality: N/A;   LEFT HEART CATH AND CORONARY ANGIOGRAPHY N/A 02/15/2021   Procedure: LEFT HEART CATH AND CORONARY ANGIOGRAPHY;  Surgeon: Troy Sine, MD;  Location: [facility];  Service: Cardiovascular;  Laterality: N/A;   LEFT HEART CATHETERIZATION WITH CORONARY ANGIOGRAM N/A 02/27/2012   Procedure: LEFT HEART CATHETERIZATION WITH CORONARY ANGIOGRAM;  Surgeon: Lorretta Harp, MD;  Location: [facility];  Service: Cardiovascular;  Laterality: N/A;   TRANSTHORACIC ECHOCARDIOGRAM  07/29/2010   EF 50=55%, mod inf wall hypokinesis & mild post wall hypokinesis; LA mild-mod dilated; mild mitral annular calcif & mild MR; mild TR & elevated RV systolic pressure; AV mildly sclerotic; mild aortic root dilatation    V TACH ABLATION N/A 01/20/2021   Procedure: V TACH ABLATION;  Surgeon: Vickie Epley, MD;  Location: [facility];  Service: Cardiovascular;  Laterality: N/A;    Family History  Problem Relation Age of Onset   Heart attack Father     Social History:  Married. 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 He  reports that he quit smoking about 3 1/2 years ago. His smoking use included cigarettes. He has a 50.00 pack-year smoking history. He has never used smokeless tobacco. He reports that he does not currently use alcohol. He reports that he does not use drugs.   Allergies  Allergen Reactions   Omega-3 Fatty Acids Hives and Itching   Benazepril Other (See Comments)    hyperkalemia   Fish Allergy Itching    Medications Prior to Admission  Medication Sig Dispense Refill   acetaminophen (TYLENOL) 500 MG tablet Take 500 mg by mouth every 6 (six)  hours as needed for moderate pain.     albuterol (VENTOLIN HFA) 108 (90 Base) MCG/ACT inhaler Inhale 2 puffs into the lungs every 6 (six) hours as needed for wheezing or shortness of breath. 1 each 6   cephALEXin (KEFLEX) 500 MG capsule Take 1 capsule (500 mg total) by mouth every 8 (eight) hours for 7 days. (Patient taking differently: Take 500 mg by mouth every 8 (eight) hours. Start date :02/11/21) 21 capsule 0   ELIQUIS 5 MG TABS tablet TAKE 1 TABLET BY MOUTH TWICE A DAY (Patient taking differently: Take 5 mg by mouth 2 (two) times daily.) 180 tablet 1   ENTRESTO 24-26 MG TAKE 1 TABLET BY MOUTH TWICE A DAY (Patient taking differently: Take 1 tablet by mouth 2 (two) times daily.) 60 tablet 6   Fluticasone Furoate (ARNUITY ELLIPTA) 200 MCG/ACT AEPB Inhale 1 puff into the lungs daily. 30 each 6   insulin aspart (NOVOLOG FLEXPEN) 100 UNIT/ML FlexPen INJECT 5-20 UNITS SUBCUTANEOUSLY WITH LUNCH AND DINNER (Patient taking differently: Inject 6-8 Units into the skin daily as needed for high blood sugar.) 15 mL 1   JARDIANCE 25 MG TABS tablet TAKE 1 TABLET BY MOUTH EVERY DAY (Patient taking differently: Take 25 mg by mouth daily.) 30 tablet 5   ketoconazole (NIZORAL) 2 % shampoo Apply 1 application topically 2 (two) times a week.     LEVEMIR FLEXTOUCH 100 UNIT/ML FlexPen INJECT 46 UNITS INTO THE SKIN DAILY. DX:E11.9 (Patient taking differently: Inject 46 Units into the skin daily.) 15 mL 3   metoprolol succinate (TOPROL-XL) 100 MG 24 hr tablet Take 1 tablet (100 mg total) by mouth daily. Take with or immediately following a meal. (Patient taking differently: Take 100 mg by mouth daily.) 90 tablet 1   mexiletine (MEXITIL) 150 MG capsule Take 2 capsules (300 mg total) by mouth every 12 (twelve) hours. (Patient taking differently: Take 150 mg by mouth 2 (two) times daily.) 120 capsule 5   montelukast (SINGULAIR) 10 MG tablet TAKE 1 TABLET BY MOUTH EVERY DAY (Patient taking differently: Take 10 mg by mouth  daily.) 90 tablet 3   nitroGLYCERIN (NITROSTAT) 0.4 MG SL tablet PLACE 1 TABLET (0.4 MG TOTAL) UNDER THE TONGUE EVERY 5 (FIVE) MINUTES AS NEEDED FOR CHEST PAIN. 25 tablet 1   rosuvastatin (CRESTOR) 20 MG tablet TAKE 1 TABLET BY MOUTH EVERY DAY (Patient taking differently: Take 20 mg by mouth daily.) 90 tablet 3   sotalol (BETAPACE) 160 MG tablet Take 1 tablet (160 mg total) by mouth 2 (two) times daily. 60 tablet 6   glucose blood (ONETOUCH ULTRA) test strip USE TO CHECK BLOOD SUGAR 3 TIMES A DAY (E11.9) 100 strip 2   HYDROcodone-acetaminophen (NORCO/VICODIN) 5-325 MG tablet Take 1 tablet by mouth every 6 (six) hours as needed for severe  pain. (Patient not taking: No sig reported) 10 tablet 0   Insulin Pen Needle (NOVOFINE) 32G X 6 MM MISC 1 each by Other route daily. 100 each 3    Drug Regimen Review  Drug regimen was reviewed and remains appropriate with no significant issues identified  Home: Home Living Family/patient expects to be discharged to:: Private residence Living Arrangements: Spouse/significant other Available Help at Discharge: Family, Available 24 hours/day (Pt takes care of wife. Daughter and granddaughter currently providing assist for wife) Type of Home: House Home Access: Stairs to enter, Ramped entrance Entrance Stairs-Number of Steps: 5 STE in the front, home made ramp in the back for the dogs primarily, but he did go up and down.  Not ADA compliant. Entrance Stairs-Rails: Left Home Layout: One level Bathroom Shower/Tub: Multimedia programmer: Standard Bathroom Accessibility: Yes Home Equipment: Shower seat, Grab bars - tub/shower, Hand held shower head, Other (comment) Additional Comments: Pt has lt AFO but states he doesn't usually use it   Functional History: Prior Function Level of Independence: Independent  Functional Status:  Mobility: Bed Mobility Overal bed mobility: Needs Assistance Bed Mobility: Supine to Sit Supine to sit:  Supervision General bed mobility comments: Assist for safety Transfers Overall transfer level: Needs assistance Equipment used: Rolling walker (2 wheeled) Transfers: Sit to/from Stand Sit to Stand: Min assist Stand pivot transfers: Mod assist General transfer comment: assist to bring hips up and for balance. Pt with lt lateral lean Ambulation/Gait Ambulation/Gait assistance: Mod assist Gait Distance (Feet): 60 Feet Assistive device: Rolling walker (2 wheeled) Gait Pattern/deviations: Step-through pattern, Decreased step length - left, Steppage General Gait Details: Assist for balance and support. Pt with steppage gait on the left which is baseline for pt. Pt with left lateral lean and at times has lt foot outside of the walker. Verbal cues to stand more erect and to keep feet inside the walker. As pt approaches a seat he tends to leave walker and reach for the chair before he is all the way turned to the chair. Gait velocity: decr Gait velocity interpretation: <1.31 ft/sec, indicative of household ambulator    ADL: ADL Overall ADL's : Needs assistance/impaired Eating/Feeding: Set up, Sitting Grooming: Wash/dry hands, Min guard, Standing Upper Body Bathing: Supervision/ safety, Sitting, Standing Lower Body Bathing: Moderate assistance, Sit to/from stand Lower Body Bathing Details (indicate cue type and reason): poor stand dynamic balance Upper Body Dressing : Supervision/safety, Sitting Lower Body Dressing: Moderate assistance, Sit to/from stand Toilet Transfer: Moderate assistance, Ambulation, Regular Toilet, RW Toileting- Clothing Manipulation and Hygiene: Supervision/safety, Sitting/lateral lean Functional mobility during ADLs: Moderate assistance, Rolling walker General ADL Comments: decreased safety, leaning to the L, double vision and dizziness noted.  Cognition: Cognition Overall Cognitive Status: Impaired/Different from baseline Orientation Level: Oriented  X4 Cognition Arousal/Alertness: Awake/alert Behavior During Therapy: Impulsive Overall Cognitive Status: Impaired/Different from baseline Area of Impairment: Memory, Safety/judgement, Awareness, Problem solving Memory: Decreased short-term memory, Decreased recall of precautions Following Commands: Follows one step commands consistently Safety/Judgement: Decreased awareness of safety, Decreased awareness of deficits Awareness: Emergent Problem Solving: Requires verbal cues General Comments: Pt stating, "I am okay. I would let you know if I wasn't." Pt stated this as he was trying to stand on his own in the bathroom despite being told not too.   Blood pressure 102/66, pulse 70, temperature (!) 97.4 F (36.3 C), temperature source Oral, resp. rate 18, height 6' (1.829 m), weight 94.2 kg, SpO2 95 %. Physical Exam Vitals and nursing note reviewed.  Constitutional:      Appearance: Normal appearance. He is well-developed. He is obese.  HENT:     Head: Normocephalic and atraumatic.     Right Ear: External ear normal.     Left Ear: External ear normal.     Nose: Nose normal.  Eyes:     General:        Right eye: No discharge.        Left eye: No discharge.  Cardiovascular:     Comments: Irregularly irregular Pulmonary:     Effort: Pulmonary effort is normal.     Breath sounds: Normal breath sounds.  Abdominal:     General: There is no distension.     Palpations: Abdomen is soft.  Musculoskeletal:     Cervical back: Normal range of motion and neck supple.     Comments: No edema or tenderness in extremities  Skin:    General: Skin is warm and dry.  Neurological:     Mental Status: He is alert and oriented to person, place, and time.     Comments: Alert Mild dysphonia with occasional hoarseness and mild left facial weakness.  He was able to follow commands without difficulty.  Dysconjugate gaze and left eye slow to initiate movement.  Motor: Right upper extremity: 5/5 proximal  distal Right lower extremity: 5/5 proximal distal Left upper extremity: 4+/5 proximal distally left lower extremity: Flexion, knee extension 1+/5, ankle dorsiflexion 0/5 Sensation intact light touch  Psychiatric:        Mood and Affect: Mood normal.        Behavior: Behavior normal.    Results for orders placed or performed during the hospital encounter of 02/14/21 (from the past 48 hour(s))  Glucose, capillary     Status: Abnormal   Collection Time: 02/16/21 11:34 AM  Result Value Ref Range   Glucose-Capillary 112 (H) 70 - 99 mg/dL    Comment: Glucose reference range applies only to samples taken after fasting for at least 8 hours.  Glucose, capillary     Status: Abnormal   Collection Time: 02/16/21  4:39 PM  Result Value Ref Range   Glucose-Capillary 122 (H) 70 - 99 mg/dL    Comment: Glucose reference range applies only to samples taken after fasting for at least 8 hours.  Glucose, capillary     Status: Abnormal   Collection Time: 02/16/21  9:36 PM  Result Value Ref Range   Glucose-Capillary 156 (H) 70 - 99 mg/dL    Comment: Glucose reference range applies only to samples taken after fasting for at least 8 hours.   Comment 1 Notify RN    Comment 2 Document in Chart   Glucose, capillary     Status: Abnormal   Collection Time: 02/17/21  6:16 AM  Result Value Ref Range   Glucose-Capillary 112 (H) 70 - 99 mg/dL    Comment: Glucose reference range applies only to samples taken after fasting for at least 8 hours.   Comment 1 Notify RN    Comment 2 Document in Chart   Hemoglobin A1c     Status: Abnormal   Collection Time: 02/17/21  8:23 AM  Result Value Ref Range   Hgb A1c MFr Bld 7.6 (H) 4.8 - 5.6 %    Comment: (NOTE) Pre diabetes:          5.7%-6.4%  Diabetes:              >6.4%  Glycemic control for   <7.0% adults with  diabetes    Mean Plasma Glucose 171.42 mg/dL    Comment: Performed at [facility] [address], [place], [place] [address]  Glucose, capillary      Status: Abnormal   Collection Time: 02/17/21 11:31 AM  Result Value Ref Range   Glucose-Capillary 112 (H) 70 - 99 mg/dL    Comment: Glucose reference range applies only to samples taken after fasting for at least 8 hours.  Glucose, capillary     Status: Abnormal   Collection Time: 02/17/21  4:07 PM  Result Value Ref Range   Glucose-Capillary 178 (H) 70 - 99 mg/dL    Comment: Glucose reference range applies only to samples taken after fasting for at least 8 hours.  Glucose, capillary     Status: Abnormal   Collection Time: 02/17/21  9:12 PM  Result Value Ref Range   Glucose-Capillary 125 (H) 70 - 99 mg/dL    Comment: Glucose reference range applies only to samples taken after fasting for at least 8 hours.  Glucose, capillary     Status: Abnormal   Collection Time: 02/18/21  6:00 AM  Result Value Ref Range   Glucose-Capillary 138 (H) 70 - 99 mg/dL    Comment: Glucose reference range applies only to samples taken after fasting for at least 8 hours.   CT CHEST WO CONTRAST  Result Date: 02/16/2021 CLINICAL DATA:  Lung nodule EXAM: CT CHEST WITHOUT CONTRAST TECHNIQUE: Multidetector CT imaging of the chest was performed following the standard protocol without IV contrast. COMPARISON:  08/15/2020 FINDINGS: Cardiovascular: Left fibrillator noted with proximal and distal leads in the right atrium and right ventricle, respectively. Coronary, aortic arch, and branch vessel atherosclerotic vascular disease. Mild cardiomegaly with fat density along the lateral wall the left ventricle possibly result of prior myocardial infarction. The ascending thoracic aorta measures 4.2 cm in diameter. Mediastinum/Nodes: Unremarkable Lungs/Pleura: Scattered chronically stable pulmonary nodules up to 0.6 cm in diameter observed. Along the upper margin of a prior tree-in-bud cluster of nodularity in the left upper lobe there is a dominant pulmonary nodule which has substantially enlarged compared to 07/26/2020, currently  measuring 22 by 19 by 19 mm (volume = 4200 mm^3), and previously measuring 6 by 4 by 5 mm (volume = 60 mm^3). Upper Abdomen: Fluid density exophytic lesion from the right kidney upper pole compatible with cyst. Musculoskeletal: Thoracic spondylosis with multilevel bridging spurring. IMPRESSION: 1. Along a cluster of tree-in-bud nodularity in the left upper lobe, a previous 60 cubic mm nodule currently measures 4200 cubic mm, averaging 20 mm in diameter. Although proximity to the tree-in-bud nodularity raises the possibility that this represents atypical infection such as MAI, the nodule growth is clearly different from the other nodules and raises the possibility of malignancy. Tissue diagnosis is recommended when feasible. 2. Ascending thoracic aorta measures 4.2 cm in diameter, stable by my measurements. Recommend annual imaging followup by CTA or MRA. This recommendation follows 2010 ACCF/AHA/AATS/ACR/ASA/SCA/SCAI/SIR/STS/SVM Guidelines for the Diagnosis and Management of Patients with Thoracic Aortic Disease. Circulation. 2010; 121: H607-P710. Aortic aneurysm NOS (ICD10-I71.9) 3. Other imaging findings of potential clinical significance: Aortic Atherosclerosis (ICD10-I70.0). Coronary atherosclerosis. Mild cardiomegaly. Electronically Signed   By: Van Clines M.D.   On: 02/16/2021 18:57   MR BRAIN WO CONTRAST  Result Date: 02/16/2021 CLINICAL DATA:  Acute neuro deficit.  Suspect stroke. EXAM: MRI HEAD WITHOUT CONTRAST TECHNIQUE: Multiplanar, multiecho pulse sequences of the brain and surrounding structures were obtained without intravenous contrast. COMPARISON:  CT head 02/15/2021 FINDINGS:  Brain: Acute infarct right medial thalamus measuring approximately 10 x 15 mm. Small areas of acute infarct in the frontal cortex bilaterally over the convexity and in the right parietal white matter. Mild atrophy without hydrocephalus. Negative for hemorrhage or mass. Vascular: Normal arterial flow voids Skull and  upper cervical spine: No focal skeletal lesion. Sinuses/Orbits: Mucosal edema paranasal sinuses. Retention cyst right maxillary sinus. Bilateral cataract surgery Other: None IMPRESSION: Acute infarct right thalamus. Small punctate areas of acute infarct in the frontal lobes bilaterally and right parietal white matter. Findings suggest cerebral emboli. Electronically Signed   By: Franchot Gallo M.D.   On: 02/16/2021 13:25       Medical Problem List and Plan: 1.  poor safety awareness with lack of insight into deficits, balance deficits with lateral lean, difficulty with high-level tasks as well as reports of double vision with dizziness secondary to right thalamic infarct, bilateral punctate frontal lobe infarcts - embolic.  -patient may not shower  -ELOS/Goals: 8-12 days/Supervision/mod I  Admit to CIR 2.  Antithrombotics: -DVT/anticoagulation:  Pharmaceutical: Other (comment)--Eliquis indefinitely   -antiplatelet therapy: ASA/Plavix. -->Dr. Ellyn Hack recommends triple drug therapy with discontinuation of ASA after a month? Plavix for minimum 6-12 months.  3. Pain Management:  4. Mood: LCSW to follow for evaluation  and support.   -antipsychotic agents: N/a 5. Neuropsych: This patient is capable of making decisions on his own behalf. 6. Skin/Wound Care: Routine pressure relief measures 7. Fluids/Electrolytes/Nutrition: Monitor I/Os.  CMP ordered for tomorrow AM 8.  CAD/NSTEMI s/p PTCA: ON ASA, Plavix, Crestor, metoprolol   --monitor for symptoms with increase in activity.  9. H/o PAF/VT s/p ICD/ablation 8/22: Monitor HR TID and for any symptoms  --On Betapace, [name] and Metoprolol 10. T2DM: Hgb A1C-7.6.  On Jardiance and Levemir with SSI for tighter control Monitor BS ac/hs.  Monitor with increased mobility  11. HTN: Monitor BP tid --continue Enstresto, metoprolol and Betapace   Monitor with increased mobility 12. Chronic combined CHF:  EF 40-45% --evolemic. Monitor for signs of  overload.  Daily weights  --On Ferne Coe and Statin.  13. Osteomyelitis s/p L-2nd toe amp: ON Keflex for group B strep thorough 09/02.  14. COPD/Enlargening LUL mass: Continue Singulair, Budesonide nebs BID and albuterol prn SOB  --Has appt with Dr. Valeta Harms on 03/30/21 at 3:30 pm.   Bary Leriche, PA-C 02/18/2021  I have personally performed a face to face diagnostic evaluation, including, but not limited to relevant history and physical exam findings, of this patient and developed relevant assessment and plan.  Additionally, I have reviewed and concur with the physician assistant's documentation above.  Delice Lesch, MD, ABPMR    The patient's status has not changed. Any changes from the pre-admission screening or documentation from the acute chart are noted above.   Delice Lesch, MD, ABPMR

## 2021-02-18 NOTE — Consult Note (Signed)
Subjective:  Patient ID: John [name], male    DOB: Oct 03, 1950,  MRN: [id number]  Chief Complaint  Patient presents with   Chest Pain    DOS: 02/10/2021 Procedure: Left second digit amputation  70 y.o. male returns for post-op check.  Patient states he is doing well.  Occasional numbness tingling otherwise no acute complaints.  No nausea fever chills vomiting.  Resting well.  Review of Systems: Negative except as noted in the HPI. Denies N/V/F/Ch.  Past Medical History:  Diagnosis Date   Atrial flutter ([place])    s/p cardioversion   Coronary artery disease    Diabetes mellitus    GERD (gastroesophageal reflux disease)    History of nuclear stress test 04/04/2011   lexiscan; mod-large in size fixed inferolateral defect (scar); non-diagnostic for ischemia; low risk scan    Hyperlipidemia    Hypertension    Left foot drop    r/t past disk srugery - uses Kevlar brace   Myocardial infarction [facility])    posterior MI   Shortness of breath    Sleep apnea    on CPAP; 04/28/2007 split-night - AHI during total sleep 44.43/hr and REM 72.56/hr    Current Facility-Administered Medications:    0.9 %  sodium chloride infusion, 250 mL, Intravenous, PRN, Troy Sine, MD, Stopped at 02/16/21 1153   acetaminophen (TYLENOL) tablet 650 mg, 650 mg, Oral, Q4H PRN, Bhagat, Bhavinkumar, PA   albuterol (VENTOLIN HFA) 108 (90 Base) MCG/ACT inhaler 2 puff, 2 puff, Inhalation, Q6H PRN, Bhagat, Bhavinkumar, PA   apixaban (ELIQUIS) tablet 5 mg, 5 mg, Oral, BID, Dennison Mascot, PA-C, 5 mg at 02/18/21 4696   aspirin EC tablet 81 mg, 81 mg, Oral, Daily, Bhagat, Bhavinkumar, PA, 81 mg at 02/18/21 0929   budesonide (PULMICORT) nebulizer solution 0.5 mg, 2 mL, Inhalation, BID, Bhagat, Bhavinkumar, PA, 0.5 mg at 02/18/21 0914   cephALEXin (KEFLEX) capsule 500 mg, 500 mg, Oral, Q8H, Bhagat, Bhavinkumar, PA, 500 mg at 02/18/21 2952   Chlorhexidine Gluconate Cloth 2 % PADS 6 each, 6 each, Topical, Daily,  Branch, Alphonse Guild, MD, 6 each at 02/18/21 0930   clopidogrel (PLAVIX) tablet 75 mg, 75 mg, Oral, Q breakfast, Troy Sine, MD, 75 mg at 02/18/21 0929   empagliflozin (JARDIANCE) tablet 25 mg, 25 mg, Oral, Daily, Bhagat, Bhavinkumar, PA, 25 mg at 02/18/21 8413   HYDROcodone-acetaminophen (NORCO/VICODIN) 5-325 MG per tablet 1 tablet, 1 tablet, Oral, Q6H PRN, Bhagat, Bhavinkumar, PA, 1 tablet at 02/18/21 0521   insulin aspart (novoLOG) injection 0-15 Units, 0-15 Units, Subcutaneous, TID WC, Bhagat, Bhavinkumar, PA, 2 Units at 02/18/21 0928   metoprolol succinate (TOPROL-XL) 24 hr tablet 100 mg, 100 mg, Oral, Daily, Bhagat, Bhavinkumar, PA, 100 mg at 02/18/21 0929   mexiletine (MEXITIL) capsule 150 mg, 150 mg, Oral, Q12H, Branch, Alphonse Guild, MD, 150 mg at 02/18/21 0929   montelukast (SINGULAIR) tablet 10 mg, 10 mg, Oral, Daily, Bhagat, Bhavinkumar, PA, 10 mg at 02/18/21 0929   nitroGLYCERIN (NITROSTAT) SL tablet 0.4 mg, 0.4 mg, Sublingual, Q5 Min x 3 PRN, Bhagat, Bhavinkumar, PA   nitroGLYCERIN 50 mg in dextrose 5 % 250 mL (0.2 mg/mL) infusion, 0-200 mcg/min, Intravenous, Continuous, Bhagat, Bhavinkumar, PA, Stopped at 02/16/21 0911   ondansetron (ZOFRAN) injection 4 mg, 4 mg, Intravenous, Q6H PRN, Bhagat, Bhavinkumar, PA   rosuvastatin (CRESTOR) tablet 40 mg, 40 mg, Oral, Daily, Troy Sine, MD, 40 mg at 02/18/21 0929   sacubitril-valsartan (ENTRESTO) 24-26 mg per tablet, 1 tablet,  Oral, BID, Bhagat, Bhavinkumar, PA, 1 tablet at 02/18/21 0929   sodium chloride flush (NS) 0.9 % injection 3 mL, 3 mL, Intravenous, Q12H, Troy Sine, MD, 3 mL at 02/18/21 5643   sodium chloride flush (NS) 0.9 % injection 3 mL, 3 mL, Intravenous, PRN, Troy Sine, MD   sotalol (BETAPACE) tablet 160 mg, 160 mg, Oral, BID, Bhagat, Bhavinkumar, PA, 160 mg at 02/18/21 3295  Social History   Tobacco Use  Smoking Status Former   Packs/day: 1.00   Years: 50.00   Pack years: 50.00   Types: Cigarettes   Quit  date: 07/20/2016   Years since quitting: 4.5  Smokeless Tobacco Never    Allergies  Allergen Reactions   Omega-3 Fatty Acids Hives and Itching   Benazepril Other (See Comments)    hyperkalemia   Fish Allergy Itching   Objective:   Vitals:   02/18/21 0811 02/18/21 0914  BP: 102/66   Pulse: 69 70  Resp: 18 18  Temp: (!) 97.4 F (36.3 C)   SpO2: 98% 95%   Body mass index is 28.17 kg/m. Constitutional Well developed. Well nourished.  Vascular Foot warm and well perfused. Capillary refill normal to all digits.   Neurologic Normal speech. Oriented to person, place, and time. Epicritic sensation to light touch grossly present bilaterally.  Dermatologic Skin healing well without signs of infection. Skin edges well coapted without signs of infection.  Orthopedic: Tenderness to palpation noted about the surgical site.   Radiographs: None Assessment:   1. NSTEMI (non-ST elevated myocardial infarction) [facility])    Plan:  Patient was evaluated and treated and all questions answered.  S/p foot surgery left -Progressing as expected post-operatively. -Patient was evaluated bedside.  Dressing was changed.  No clinical signs of dehiscence or infection noted. -Sutures are intact.  We will plan on removing it in outpatient setting. -He will follow-up with Dr. Amalia Hailey 1 week from discharge. -Patient could do dressing changes Monday Wednesday Friday.  However should not hold up discharge. -Podiatry signed off.  Reconsult as needed  No follow-ups on file.

## 2021-02-18 NOTE — TOC Initial Note (Signed)
Transition of Care [facility]) - Initial/Assessment Note    Patient Details  Name: John [name] MRN: [id number] Date of Birth: 10-25-1950  Transition of [facility]) CM/SW Contact:    Zenon Mayo, RN Phone Number: 02/18/2021, 12:30 PM  Clinical Narrative:                 Patient for dc to CIR.  Expected Discharge Plan: IP Rehab Facility Barriers to Discharge: No Barriers Identified   Patient Goals and CMS Choice        Expected Discharge Plan and Services Expected Discharge Plan: [place]       Living arrangements for the past 2 months: Single Family Home Expected Discharge Date: 02/18/21                                    Prior Living Arrangements/Services Living arrangements for the past 2 months: Single Family Home Lives with:: Spouse                   Activities of Daily Living Home Assistive Devices/Equipment: None ADL Screening (condition at time of admission) Patient's cognitive ability adequate to safely complete daily activities?: Yes Is the patient deaf or have difficulty hearing?: Yes Does the patient have difficulty seeing, even when wearing glasses/contacts?: No Does the patient have difficulty concentrating, remembering, or making decisions?: No Patient able to express need for assistance with ADLs?: Yes Does the patient have difficulty dressing or bathing?: No Independently performs ADLs?: Yes (appropriate for developmental age) Does the patient have difficulty walking or climbing stairs?: No Weakness of Legs: None Weakness of Arms/Hands: None  Permission Sought/Granted                  Emotional Assessment       Orientation: : Oriented to Situation, Oriented to  Time, Oriented to Place, Oriented to Self Alcohol / Substance Use: Not Applicable Psych Involvement: No (comment)  Admission diagnosis:  NSTEMI (non-ST elevated myocardial infarction) ([place]) [I21.4] Patient Active Problem List   Diagnosis Date  Noted   ACS (acute coronary syndrome) ([place])    Left-sided neglect    Acute focal neurological deficit    NSTEMI (non-ST elevated myocardial infarction) ([place]) 02/14/2021   Diabetic infection of left foot ([place]) 46/27/0350   Chronic systolic CHF (congestive heart failure) ([place]) 02/08/2021   History of ventricular tachycardia 02/08/2021   VT (ventricular tachycardia) ([place]) 01/26/2021   Coagulation defect ([place]) 12/22/2020   Non-ST elevation (NSTEMI) myocardial infarction [facility])    Ventricular tachycardia ([place]) 08/18/2020   Capsulitis 05/21/2020   Elevated troponin    Hyperglycemia due to diabetes mellitus ([place]) 02/29/2020   Thoracic aortic aneurysm ([place]) 02/29/2020   Hypotensive episode 02/29/2020   Obesity (BMI 30.0-34.9) 02/29/2020   Lung nodule 02/19/2020   Former smoker 02/19/2020   Healthcare maintenance 02/19/2020   Acoustic neuroma ([place]) 09/11/2017   Asymmetrical sensorineural hearing loss 09/11/2017   Encounter for cardioversion procedure    Anticoagulation adequate 06/06/2016   Fatigue 10/27/2015   Bradycardia 10/27/2015   Hyperlipidemia LDL goal <70 05/20/2015   OSA on CPAP 07/23/2013   Excessive daytime sleepiness 07/23/2013   Hypertension    Hyperlipidemia    Diabetes mellitus    Chest pain 08/18/2012   Tobacco abuse 08/18/2012   Unstable angina, cath [facility] 02/27/12 02/26/2012   CAD, multiple prior RCA PCI's. Last cath 2010, Myoview low risk Oct 2012  02/26/2012   DM type 2, goal HbA1c < 7% (HCC) 02/26/2012   HTN (hypertension) 02/26/2012   Dyslipidemia, low HDL. Intol to fish oil and Noacin 02/26/2012   Sleep apnea, on C-pap 02/26/2012   COPD (chronic obstructive pulmonary disease) ([place]) 02/26/2012   Smoking, quit one week ago 02/26/2012   GERD (gastroesophageal reflux disease) 02/26/2012   PCP:  [name], MD Pharmacy:   [id number] - [address] [place] [address] Phone: [phone] Fax:  [phone]     Social Determinants of Health (SDOH) Interventions    Readmission Risk Interventions Readmission Risk Prevention Plan 02/18/2021  Transportation Screening Complete  Medication Review Press photographer) Complete  PCP or Specialist appointment within 3-5 days of discharge Complete  HRI or [place] Complete  SW Recovery Care/Counseling Consult Complete  [place] Not Applicable  Some recent data might be hidden

## 2021-02-18 NOTE — PMR Pre-admission (Addendum)
PMR Admission Coordinator Pre-Admission Assessment  Patient: John [name] is an 70 y.o., male MRN: [id number] DOB: 04/03/1951 Height: 6' (182.9 cm) Weight: 94.2 kg  Insurance Information HMO:     PPO:      PCP:      IPA:      80/20:      OTHER:  PRIMARY: Medicare a and b      Policy#: [id number]      Subscriber: pt Benefits:  Phone #: passport one online     Name: 9/2 Eff. Date: a 10/17/2013 and b 11/17/2013     Deduct: $1556      Out of Pocket Max: none      Life Max: none CIR: 100%      SNF: 20 full days Outpatient: 80%     Co-Pay: 20% Home Health: 100%      Co-Pay: none DME: 80%     Co-Pay: 20% Providers: pt choice  SECONDARY: BCBS supplement      Policy#: [id number]  Financial Counselor:       Phone#:   The "Data Collection Information Summary" for patients in Inpatient Rehabilitation Facilities with attached "Privacy Act Statement-Health Care Records" was provided and verbally reviewed with: Patient  Emergency Contact Information Contact Information     Name Relation Home Work Mobile   Badders,Betty Spouse   [phone]   Lingo,gary Son   [phone]   Jernigan,Connie Daughter   [phone]      Current Medical History  Patient Admitting Diagnosis: CVA  History of Present Illness: 70 year male with history of CAD with multiple prior PCI'S, HTN, OSA, Aflutter, V Tach with ICD, Chronic systolic HF. Recent left second digit amputation.Presented on 02/14/2021 with chest pain sudden onset at rest.   He underwent heart cath with total occlusion of large distal RCA art site of prior stenting. A difficult but successful PCI/DES x 1 to RPA V/PL system. Continue BB and Entresto. He will complete 30 days of triple therapy then stop ASA. Near completion of the PCI, had change in responsiveness and left sided weakness with facial droop. Code stroke initiated and Stat CT head. Left hemi neglect and hemiparesis. CTA negative for significant stenosis or occlusion. MRI positive  for acute right thalamus infarct and small punctate areas of acute infarct in frontal lobes. Neurology agrees with the above triple therapy x 30 days then D/C ASA.   Lung nodule and PCCM been consulted for enlarging left upper lobe nodule which is now 22 x 19 x 19mm compared to 6 x 4 x 5mm in 07/2020. He is followed by Dr. Icard for lung nodules in the pulmonary clinic. Patient will need to follow up with Dr. Icard in 1 month after he is able to stop aspirin therapy to discuss planning options for navigational bronchoscopy and biopsy.  He will need have the plavix held for 5 days prior to his procedure, will discuss with cardiology on earliest timeline this could be held. The eliquis can be bridged with lovenox if needed.   Complete NIHSS TOTAL: 7  Patient's medical record from [facility] has been reviewed by the rehabilitation admission coordinator and physician.  Past Medical History  Past Medical History:  Diagnosis Date   Atrial flutter (HCC)    s/p cardioversion   Coronary artery disease    Diabetes mellitus    GERD (gastroesophageal reflux disease)    History of nuclear stress test 04/04/2011   lexiscan; mod-large in size fixed inferolateral defect (scar); non-diagnostic   for ischemia; low risk scan    Hyperlipidemia    Hypertension    Left foot drop    r/t past disk srugery - uses Kevlar brace   Myocardial infarction (HCC)    posterior MI   Shortness of breath    Sleep apnea    on CPAP; 04/28/2007 split-night - AHI during total sleep 44.43/hr and REM 72.56/hr   Has the patient had major surgery during 100 days prior to admission? Yes  Family History   family history includes Heart attack in his father.  Current Medications  Current Facility-Administered Medications:    0.9 %  sodium chloride infusion, 250 mL, Intravenous, PRN, Kelly, Thomas A, MD, Stopped at 02/16/21 1153   acetaminophen (TYLENOL) tablet 650 mg, 650 mg, Oral, Q4H PRN, Bhagat, Bhavinkumar, PA    albuterol (VENTOLIN HFA) 108 (90 Base) MCG/ACT inhaler 2 puff, 2 puff, Inhalation, Q6H PRN, Bhagat, Bhavinkumar, PA   apixaban (ELIQUIS) tablet 5 mg, 5 mg, Oral, BID, Francois, Marie Y, PA-C, 5 mg at 02/18/21 0929   aspirin EC tablet 81 mg, 81 mg, Oral, Daily, Bhagat, Bhavinkumar, PA, 81 mg at 02/18/21 0929   budesonide (PULMICORT) nebulizer solution 0.5 mg, 2 mL, Inhalation, BID, Bhagat, Bhavinkumar, PA, 0.5 mg at 02/18/21 0914   cephALEXin (KEFLEX) capsule 500 mg, 500 mg, Oral, Q8H, Bhagat, Bhavinkumar, PA, 500 mg at 02/18/21 0521   Chlorhexidine Gluconate Cloth 2 % PADS 6 each, 6 each, Topical, Daily, Branch, Jonathan F, MD, 6 each at 02/18/21 0930   clopidogrel (PLAVIX) tablet 75 mg, 75 mg, Oral, Q breakfast, Kelly, Thomas A, MD, 75 mg at 02/18/21 0929   empagliflozin (JARDIANCE) tablet 25 mg, 25 mg, Oral, Daily, Bhagat, Bhavinkumar, PA, 25 mg at 02/18/21 0929   HYDROcodone-acetaminophen (NORCO/VICODIN) 5-325 MG per tablet 1 tablet, 1 tablet, Oral, Q6H PRN, Bhagat, Bhavinkumar, PA, 1 tablet at 02/18/21 0521   insulin aspart (novoLOG) injection 0-15 Units, 0-15 Units, Subcutaneous, TID WC, Bhagat, Bhavinkumar, PA, 2 Units at 02/18/21 1156   metoprolol succinate (TOPROL-XL) 24 hr tablet 100 mg, 100 mg, Oral, Daily, Bhagat, Bhavinkumar, PA, 100 mg at 02/18/21 0929   mexiletine (MEXITIL) capsule 150 mg, 150 mg, Oral, Q12H, Branch, Jonathan F, MD, 150 mg at 02/18/21 0929   montelukast (SINGULAIR) tablet 10 mg, 10 mg, Oral, Daily, Bhagat, Bhavinkumar, PA, 10 mg at 02/18/21 0929   nitroGLYCERIN (NITROSTAT) SL tablet 0.4 mg, 0.4 mg, Sublingual, Q5 Min x 3 PRN, Bhagat, Bhavinkumar, PA   nitroGLYCERIN 50 mg in dextrose 5 % 250 mL (0.2 mg/mL) infusion, 0-200 mcg/min, Intravenous, Continuous, Bhagat, Bhavinkumar, PA, Stopped at 02/16/21 0911   ondansetron (ZOFRAN) injection 4 mg, 4 mg, Intravenous, Q6H PRN, Bhagat, Bhavinkumar, PA   rosuvastatin (CRESTOR) tablet 40 mg, 40 mg, Oral, Daily, Kelly, Thomas A,  MD, 40 mg at 02/18/21 0929   sacubitril-valsartan (ENTRESTO) 24-26 mg per tablet, 1 tablet, Oral, BID, Bhagat, Bhavinkumar, PA, 1 tablet at 02/18/21 0929   sodium chloride flush (NS) 0.9 % injection 3 mL, 3 mL, Intravenous, Q12H, Kelly, Thomas A, MD, 3 mL at 02/18/21 0928   sodium chloride flush (NS) 0.9 % injection 3 mL, 3 mL, Intravenous, PRN, Kelly, Thomas A, MD   sotalol (BETAPACE) tablet 160 mg, 160 mg, Oral, BID, Bhagat, Bhavinkumar, PA, 160 mg at 02/18/21 0929  Patients Current Diet:  Diet Order             Diet - low sodium heart healthy             Diet Carb Modified Fluid consistency: Thin; Room service appropriate? Yes  Diet effective now                  Precautions / Restrictions Precautions Precautions: Fall Precaution Comments: L foot wrapped with gauze. Restrictions Weight Bearing Restrictions: No Other Position/Activity Restrictions: Patient with "ambulation in hall" order.   Has the patient had 2 or more falls or a fall with injury in the past year? 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 Independent  Indoor Mobility: Did the patient need assistance with walking from room to room (with or without device)? Independent  Stairs: Did the patient need assistance with internal or external stairs (with or without device)? Independent  Functional Cognition: Did the patient need help planning regular tasks such as shopping or remembering to take medications? Independent  Patient Information Are you of Hispanic, Latino/a,or Spanish origin?: A. No, not of Hispanic, Latino/a, or Spanish origin What is your race?: A. White Do you need or want an interpreter to communicate with a doctor or health care staff?: 0. No  Patient's Response To:  Health Literacy and Transportation Is the  patient able to respond to health literacy and transportation needs?: Yes Health Literacy - How often do you need to have someone help you when you read instructions, pamphlets, or other written material from your doctor or pharmacy?: Never In the past 12 months, has lack of transportation kept you from medical appointments or from getting medications?: No In the past 12 months, has lack of transportation kept you from meetings, work, or from getting things needed for daily living?: No  Development worker, international aid / [place] Devices/Equipment: None Home Equipment: Shower seat, Grab bars - tub/shower, Hand held shower head, Other (comment)  Prior Device Use: Indicate devices/aids used by the patient prior to current illness, exacerbation or injury? None of the above  Current Functional Level Cognition  Overall Cognitive Status: Impaired/Different from baseline Orientation Level: Oriented X4 Following Commands: Follows one step commands consistently Safety/Judgement: Decreased awareness of safety, Decreased awareness of deficits General Comments: Pt stating, "I am okay. I would let you know if I wasn't." Pt stated this as he was trying to stand on his own in the bathroom despite being told not too.    Extremity Assessment (includes Sensation/Coordination)  Upper Extremity Assessment: Defer to OT evaluation  Lower Extremity Assessment: LLE deficits/detail LLE Deficits / Details: ankle 0/5 from prior back surgery, knee and hip grossly 4/5    ADLs  Overall ADL's : Needs assistance/impaired Eating/Feeding: Set up, Sitting Grooming: Wash/dry hands, Min guard, Standing Upper Body Bathing: Supervision/ safety, Sitting, Standing Lower Body Bathing: Moderate assistance, Sit to/from stand Lower Body Bathing Details (indicate cue type and reason): poor stand dynamic balance Upper Body Dressing : Supervision/safety, Sitting Lower Body Dressing: Moderate assistance, Sit to/from  stand Toilet Transfer: Moderate assistance, Ambulation, Regular Toilet, RW Toileting- Clothing Manipulation and Hygiene: Supervision/safety, Sitting/lateral lean Functional mobility during ADLs: Moderate assistance, Rolling walker General ADL Comments: decreased safety, leaning to the L, double vision and dizziness noted.    Mobility  Overal bed mobility: Needs Assistance Bed Mobility: Supine to Sit Supine to sit: Supervision General bed mobility comments: Assist for safety    Transfers  Overall transfer level: Needs assistance Equipment used: Rolling walker (2 wheeled) Transfers: Sit to/from Stand Sit  to Stand: Min assist Stand pivot transfers: Mod assist General transfer comment: assist to bring hips up and for balance. Pt with lt lateral lean    Ambulation / Gait / Stairs / Wheelchair Mobility  Ambulation/Gait Ambulation/Gait assistance: Mod assist Gait Distance (Feet): 60 Feet Assistive device: Rolling walker (2 wheeled) Gait Pattern/deviations: Step-through pattern, Decreased step length - left, Steppage General Gait Details: Assist for balance and support. Pt with steppage gait on the left which is baseline for pt. Pt with left lateral lean and at times has lt foot outside of the walker. Verbal cues to stand more erect and to keep feet inside the walker. As pt approaches a seat he tends to leave walker and reach for the chair before he is all the way turned to the chair. Gait velocity: decr Gait velocity interpretation: <1.31 ft/sec, indicative of household ambulator    Posture / Balance Balance Overall balance assessment: Needs assistance Sitting-balance support: Feet supported, No upper extremity supported Sitting balance-Leahy Scale: Good Standing balance support: Bilateral upper extremity supported Standing balance-Leahy Scale: Poor Standing balance comment: walker and min to min guard assist for static standing    Special needs/care consideration Hgb A1c  7.6 ICD CPAP at HS   Previous Home Environment  Living Arrangements: Spouse/significant other  Lives With: Spouse Available Help at Discharge: Family, Available 24 hours/day Type of Home: House Home Layout: One level Home Access: Stairs to enter Entrance Stairs-Rails: Left Entrance Stairs-Number of Steps: 5 STE in the front, home made ramp in the back for the dogs primarily, but he did go up and down.  Not ADA compliant. Bathroom Shower/Tub: Engineer, mining: Yes How Accessible: Accessible via walker Home Care Services: No Additional Comments: Pt has lt AFO but states he doesn't usually use it  Discharge Living Setting Plans for Discharge Living Setting: Patient's home, Lives with (comment) (wife) Type of Home at Discharge: House Discharge Home Layout: One level Discharge Home Access: Stairs to enter Entrance Stairs-Rails: Left Entrance Stairs-Number of Steps: 5 step into home; 4 steps onto porch and one step into home Discharge Bathroom Shower/Tub: Walk-in shower Discharge Bathroom Toilet: Standard Discharge Bathroom Accessibility: Yes How Accessible: Accessible via walker Does the patient have any problems obtaining your medications?: No  Social/Family/Support Systems Patient Roles: Spouse, Caregiver Contact Information: wife and daughter only per their request Anticipated Caregiver: daughter Anticipated Caregiver's Contact Information: see above Ability/Limitations of Caregiver: wife history of back issues Caregiver Availability: 24/7 Discharge Plan Discussed with Primary Caregiver: Yes Is Caregiver In Agreement with Plan?: Yes Does Caregiver/Family have Issues with Lodging/Transportation while Pt is in Rehab?: No  Goals Patient/Family Goal for Rehab: Mod I to supervision with PT adn OT Expected length of stay: ELOS 7 to 10 days Pt/Family Agrees to Admission and willing to participate: Yes Program Orientation Provided &  Reviewed with Pt/Caregiver Including Roles  & Responsibilities: Yes  Decrease burden of Care through IP rehab admission: n/a  Possible need for SNF placement upon discharge: not anticipated  Patient Condition: I have reviewed medical records from [facility] , spoken with CM, and patient, spouse, and daughter. I met with patient at the bedside for inpatient rehabilitation assessment.  Patient will benefit from ongoing PT and OT, can actively participate in 3 hours of therapy a day 5 days of the week, and can make measurable gains during the admission.  Patient will also benefit from the coordinated team approach during an Inpatient Acute Rehabilitation admission.  The  patient will receive intensive therapy as well as Rehabilitation physician, nursing, social worker, and care management interventions.  Due to bladder management, bowel management, safety, skin/wound care, disease management, medication administration, pain management, and patient education the patient requires 24 hour a day rehabilitation nursing.  The patient is currently min to mod assist overall with mobility and basic ADLs.  Discharge setting and therapy post discharge at home with home health is anticipated.  Patient has agreed to participate in the Acute Inpatient Rehabilitation Program and will admit today.  Preadmission Screen Completed By:  Cleatrice Burke, 02/18/2021 12:37 PM ______________________________________________________________________   Discussed status with Dr. Posey Pronto on 02/18/2021 at 1252 and received approval for admission today.  Admission Coordinator:  Cleatrice Burke, RN, time  1252 Date  02/18/2021   Assessment/Plan: Diagnosis: CVA Does the need for close, 24 hr/day Medical supervision in concert with the patient's rehab needs make it unreasonable for this patient to be served in a less intensive setting? Yes Co-Morbidities requiring supervision/potential complications: CAD with multiple  prior PCI'S, HTN, OSA, Aflutter, V Tach with ICD, Chronic systolic HF. Recent left second digit amputation. Due to safety, skin/wound care, disease management, pain management, and patient education, does the patient require 24 hr/day rehab nursing? Yes Does the patient require coordinated care of a physician, rehab nurse, PT, OT to address physical and functional deficits in the context of the above medical diagnosis(es)? Yes Addressing deficits in the following areas: balance, endurance, locomotion, strength, transferring, bathing, dressing, toileting, and psychosocial support Can the patient actively participate in an intensive therapy program of at least 3 hrs of therapy 5 days a week? Yes The potential for patient to make measurable gains while on inpatient rehab is excellent Anticipated functional outcomes upon discharge from inpatient rehab: modified independent and supervision PT, modified independent and supervision OT, N/A with SLP Estimated rehab length of stay to reach the above functional goals is: 10-14 days. Anticipated discharge destination: Home 10. Overall Rehab/Functional Prognosis: good   MD Signature: Delice Lesch, MD, ABPMR

## 2021-02-18 NOTE — Progress Notes (Signed)
Jamse Arn, MD   Physician  Physical Medicine and Rehabilitation  PMR Pre-admission     Addendum  Date of Service:  02/18/2021 12:37 PM       Related encounter: ED to Hosp-Admission (Discharged) from 02/14/2021 in [facility]       Show:Clear all '[x]' Written'[x]' Templated'[x]' Copied  Added by: '[x]' Iyla Balzarini, Vertis Kelch, RN'[x]' Jamse Arn, MD  '[]' Hover for details                                                                                                                                                                                                                                                                                                                                                                         PMR Admission Coordinator Pre-Admission Assessment   Patient: John [name] is an 70 y.o., male MRN: [id number] DOB: October 10, 1950 Height: 6' (182.9 cm) Weight: 94.2 kg   Insurance Information HMO:     PPO:      PCP:      IPA:      80/20:      OTHER:  PRIMARY: Medicare a and b      Policy#: [id number]      Subscriber: pt Benefits:  Phone #: passport one online     Name: 9/2 Eff. Date: a 10/17/2013 and b 11/17/2013     Deduct: $1556      Out of Pocket Max: none      Life Max: none CIR: 100%      SNF: 20 full days Outpatient: 80%     Co-Pay: 20% Home Health: 100%      Co-Pay: none DME: 80%     Co-Pay: 20% Providers: pt choice  SECONDARY: BCBS  supplement      Policy#: [id number]   Financial Counselor:       Phone#:    The "Data Collection Information Summary" for patients in Inpatient Rehabilitation Facilities with attached "Privacy Act [place] Records" was provided and verbally reviewed with: Patient   Emergency Contact Information Contact Information       Name Relation Home Work Mobile    [place] Spouse      [phone]    [place], [place]     [phone]    Anson Fret     [phone]         Current Medical History  Patient Admitting Diagnosis: CVA   History of Present Illness: 70 year male with history of CAD with multiple prior PCI'S, HTN, OSA, Aflutter, V Tach with ICD, Chronic systolic HF. Recent left second digit amputation.Presented on 02/14/2021 with chest pain sudden onset at rest.    He underwent heart cath with total occlusion of large distal RCA art site of prior stenting. A difficult but successful PCI/DES x 1 to RPA V/PL system. Continue BB and Entresto. He will complete 30 days of triple therapy then stop ASA. Near completion of the PCI, had change in responsiveness and left sided weakness with facial droop. Code stroke initiated and Stat CT head. Left hemi neglect and hemiparesis. CTA negative for significant stenosis or occlusion. MRI positive for acute right thalamus infarct and small punctate areas of acute infarct in frontal lobes. Neurology agrees with the above triple therapy x 30 days then D/C ASA.    Lung nodule and PCCM been consulted for enlarging left upper lobe nodule which is now 22 x 19 x 58m compared to 6 x 4 x 573min 07/2020. He is followed by Dr. IcValeta Harmsor lung nodules in the pulmonary clinic. Patient will need to follow up with Dr. IcValeta Harmsn 1 month after he is able to stop aspirin therapy to discuss planning options for navigational bronchoscopy and biopsy.  He will need have the plavix held for 5 days prior to his procedure, will discuss with cardiology on earliest timeline this could be held. The eliquis can be bridged with lovenox if needed.    Complete NIHSS TOTAL: 7   [facility] coordinator and physician.   Past Medical History      Past Medical History:  Diagnosis Date   [place]     s/p cardioversion   Coronary artery disease      Diabetes mellitus     GERD (gastroesophageal reflux disease)     History of nuclear stress test 04/04/2011    lexiscan; mod-large in size fixed inferolateral defect (scar); non-diagnostic for ischemia; low risk scan    Hyperlipidemia     Hypertension     Left foot drop      r/t past disk srugery - uses Kevlar brace   Myocardial infarction (HCC)      posterior MI   Shortness of breath     Sleep apnea      on CPAP; 04/28/2007 split-night - AHI during total sleep 44.43/hr and REM 72.56/hr    Has the patient had major surgery during 100 days prior to admission? Yes   Family History   family history includes Heart attack in his father.   Current Medications   Current Facility-Administered Medications:    0.9 %  sodium chloride infusion, 250 mL, Intravenous, PRN, KeTroy SineMD, Stopped  at 02/16/21 1153   acetaminophen (TYLENOL) tablet 650 mg, 650 mg, Oral, Q4H PRN, Bhagat, Bhavinkumar, PA   albuterol (VENTOLIN HFA) 108 (90 Base) MCG/ACT inhaler 2 puff, 2 puff, Inhalation, Q6H PRN, Bhagat, Bhavinkumar, PA   apixaban (ELIQUIS) tablet 5 mg, 5 mg, Oral, BID, Dennison Mascot, PA-C, 5 mg at 02/18/21 6294   aspirin EC tablet 81 mg, 81 mg, Oral, Daily, Bhagat, Bhavinkumar, PA, 81 mg at 02/18/21 0929   budesonide (PULMICORT) nebulizer solution 0.5 mg, 2 mL, Inhalation, BID, Bhagat, Bhavinkumar, PA, 0.5 mg at 02/18/21 0914   cephALEXin (KEFLEX) capsule 500 mg, 500 mg, Oral, Q8H, Bhagat, Bhavinkumar, PA, 500 mg at 02/18/21 7654   Chlorhexidine Gluconate Cloth 2 % PADS 6 each, 6 each, Topical, Daily, Branch, Alphonse Guild, MD, 6 each at 02/18/21 0930   clopidogrel (PLAVIX) tablet 75 mg, 75 mg, Oral, Q breakfast, Troy Sine, MD, 75 mg at 02/18/21 0929   empagliflozin (JARDIANCE) tablet 25 mg, 25 mg, Oral, Daily, Bhagat, Bhavinkumar, PA, 25 mg at 02/18/21 6503   HYDROcodone-acetaminophen (NORCO/VICODIN) 5-325 MG per tablet 1 tablet, 1 tablet, Oral, Q6H PRN, Bhagat, Bhavinkumar, PA, 1 tablet at  02/18/21 0521   insulin aspart (novoLOG) injection 0-15 Units, 0-15 Units, Subcutaneous, TID WC, Bhagat, Bhavinkumar, PA, 2 Units at 02/18/21 1156   metoprolol succinate (TOPROL-XL) 24 hr tablet 100 mg, 100 mg, Oral, Daily, Bhagat, Bhavinkumar, PA, 100 mg at 02/18/21 0929   mexiletine (MEXITIL) capsule 150 mg, 150 mg, Oral, Q12H, Branch, Alphonse Guild, MD, 150 mg at 02/18/21 0929   montelukast (SINGULAIR) tablet 10 mg, 10 mg, Oral, Daily, Bhagat, Bhavinkumar, PA, 10 mg at 02/18/21 0929   nitroGLYCERIN (NITROSTAT) SL tablet 0.4 mg, 0.4 mg, Sublingual, Q5 Min x 3 PRN, Bhagat, Bhavinkumar, PA   nitroGLYCERIN 50 mg in dextrose 5 % 250 mL (0.2 mg/mL) infusion, 0-200 mcg/min, Intravenous, Continuous, Bhagat, Bhavinkumar, PA, Stopped at 02/16/21 0911   ondansetron (ZOFRAN) injection 4 mg, 4 mg, Intravenous, Q6H PRN, Bhagat, Bhavinkumar, PA   rosuvastatin (CRESTOR) tablet 40 mg, 40 mg, Oral, Daily, Troy Sine, MD, 40 mg at 02/18/21 0929   sacubitril-valsartan (ENTRESTO) 24-26 mg per tablet, 1 tablet, Oral, BID, Bhagat, Bhavinkumar, PA, 1 tablet at 02/18/21 0929   sodium chloride flush (NS) 0.9 % injection 3 mL, 3 mL, Intravenous, Q12H, Troy Sine, MD, 3 mL at 02/18/21 0928   sodium chloride flush (NS) 0.9 % injection 3 mL, 3 mL, Intravenous, PRN, Troy Sine, MD   sotalol (BETAPACE) tablet 160 mg, 160 mg, Oral, BID, Bhagat, Bhavinkumar, PA, 160 mg at 02/18/21 5465   Patients Current Diet:  Diet Order                  Diet - low sodium heart healthy             Diet Carb Modified Fluid consistency: Thin; Room service appropriate? Yes  Diet effective now                       Precautions / Restrictions Precautions Precautions: Fall Precaution Comments: L foot wrapped with gauze. Restrictions Weight Bearing Restrictions: No Other Position/Activity Restrictions: Patient with "ambulation in hall" order.    Has the patient had 2 or more falls or a fall with injury in the past year?  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 Independent   Indoor Mobility: Did the patient need assistance with walking from room to room (with or without device)? Independent   Stairs: Did the patient need assistance with internal or external stairs (with or without device)? Independent   Functional Cognition: Did the patient need help planning regular tasks such as shopping or remembering to take medications? Independent   Patient Information Are you of Hispanic, Latino/a,or Spanish origin?: A. No, not of Hispanic, Latino/a, or Spanish origin What is your race?: A. White Do you need or want an interpreter to communicate with a doctor or health care staff?: 0. No   Patient's Response To:  Health Literacy and Transportation Is the patient able to respond to health literacy and transportation needs?: Yes Health Literacy - How often do you need to have someone help you when you read instructions, pamphlets, or other written material from your doctor or pharmacy?: Never In the past 12 months, has lack of transportation kept you from medical appointments or from getting medications?: No In the past 12 months, has lack of transportation kept you from meetings, work, or from getting things needed for daily living?: No   Development worker, international aid / [place] Devices/Equipment: None Home Equipment: Shower seat, Grab bars - tub/shower, Hand held shower head, Other (comment)   Prior Device Use: Indicate devices/aids used by the patient prior to current illness, exacerbation or injury? None of the above   Current Functional Level Cognition   Overall Cognitive Status: Impaired/Different from baseline Orientation Level: Oriented X4 Following Commands: Follows one step commands  consistently Safety/Judgement: Decreased awareness of safety, Decreased awareness of deficits General Comments: Pt stating, "I am okay. I would let you know if I wasn't." Pt stated this as he was trying to stand on his own in the bathroom despite being told not too.    Extremity Assessment (includes Sensation/Coordination)   Upper Extremity Assessment: Defer to OT evaluation  Lower Extremity Assessment: LLE deficits/detail LLE Deficits / Details: ankle 0/5 from prior back surgery, knee and hip grossly 4/5     ADLs   Overall ADL's : Needs assistance/impaired Eating/Feeding: Set up, Sitting Grooming: Wash/dry hands, Min guard, Standing Upper Body Bathing: Supervision/ safety, Sitting, Standing Lower Body Bathing: Moderate assistance, Sit to/from stand Lower Body Bathing Details (indicate cue type and reason): poor stand dynamic balance Upper Body Dressing : Supervision/safety, Sitting Lower Body Dressing: Moderate assistance, Sit to/from stand Toilet Transfer: Moderate assistance, Ambulation, Regular Toilet, RW Toileting- Clothing Manipulation and Hygiene: Supervision/safety, Sitting/lateral lean Functional mobility during ADLs: Moderate assistance, Rolling walker General ADL Comments: decreased safety, leaning to the L, double vision and dizziness noted.     Mobility   Overal bed mobility: Needs Assistance Bed Mobility: Supine to Sit Supine to sit: Supervision General bed mobility comments: Assist for safety     Transfers   Overall transfer level: Needs assistance Equipment used: Rolling walker (2 wheeled) Transfers: Sit to/from Stand Sit to Stand: Min assist Stand pivot transfers: Mod assist General transfer comment: assist to bring hips up and for balance. Pt with lt lateral lean     Ambulation / Gait / Stairs / Wheelchair Mobility   Ambulation/Gait Ambulation/Gait assistance: Mod assist Gait Distance (Feet): 60 Feet Assistive device: Rolling walker (2 wheeled) Gait  Pattern/deviations: Step-through pattern, Decreased step length - left, Steppage General Gait Details: Assist for balance and support. Pt with steppage gait on the left which is baseline for pt. Pt  with left lateral lean and at times has lt foot outside of the walker. Verbal cues to stand more erect and to keep feet inside the walker. As pt approaches a seat he tends to leave walker and reach for the chair before he is all the way turned to the chair. Gait velocity: decr Gait velocity interpretation: <1.31 ft/sec, indicative of household ambulator     Posture / Balance Balance Overall balance assessment: Needs assistance Sitting-balance support: Feet supported, No upper extremity supported Sitting balance-Leahy Scale: Good Standing balance support: Bilateral upper extremity supported Standing balance-Leahy Scale: Poor Standing balance comment: walker and min to min guard assist for static standing     Special needs/care consideration Hgb A1c 7.6 ICD CPAP at HS    Previous Home Environment  Living Arrangements: Spouse/significant other  Lives With: Spouse Available Help at Discharge: Family, Available 24 hours/day Type of Home: House Home Layout: One level Home Access: Stairs to enter Entrance Stairs-Rails: Left Entrance Stairs-Number of Steps: 5 STE in the front, home made ramp in the back for the dogs primarily, but he did go up and down.  Not ADA compliant. Bathroom Shower/Tub: Engineer, mining: Yes How Accessible: Accessible via walker Home Care Services: No Additional Comments: Pt has lt AFO but states he doesn't usually use it   Discharge Living Setting Plans for Discharge Living Setting: Patient's home, Lives with (comment) (wife) Type of Home at Discharge: House Discharge Home Layout: One level Discharge Home Access: Stairs to enter Entrance Stairs-Rails: Left Entrance Stairs-Number of Steps: 5 step into home; 4 steps onto  porch and one step into home Discharge Bathroom Shower/Tub: Walk-in shower Discharge Bathroom Toilet: Standard Discharge Bathroom Accessibility: Yes How Accessible: Accessible via walker Does the patient have any problems obtaining your medications?: No   Social/Family/Support Systems Patient Roles: Spouse, Caregiver Contact Information: wife and daughter only per their request Anticipated Caregiver: daughter Anticipated Caregiver's Contact Information: see above Ability/Limitations of Caregiver: wife history of back issues Caregiver Availability: 24/7 Discharge Plan Discussed with Primary Caregiver: Yes Is Caregiver In Agreement with Plan?: Yes Does Caregiver/Family have Issues with Lodging/Transportation while Pt is in Rehab?: No   Goals Patient/Family Goal for Rehab: Mod I to supervision with PT adn OT Expected length of stay: ELOS 7 to 10 days Pt/Family Agrees to Admission and willing to participate: Yes Program Orientation Provided & Reviewed with Pt/Caregiver Including Roles  & Responsibilities: Yes   Decrease burden of Care through IP rehab admission: n/a   Possible need for SNF placement upon discharge: not anticipated   Patient Condition: I have reviewed medical records from [facility] , spoken with CM, and patient, spouse, and daughter. I met with patient at the bedside for inpatient rehabilitation assessment.  Patient will benefit from ongoing PT and OT, can actively participate in 3 hours of therapy a day 5 days of the week, and can make measurable gains during the admission.  Patient will also benefit from the coordinated team approach during an Inpatient Acute Rehabilitation admission.  The patient will receive intensive therapy as well as Rehabilitation physician, nursing, social worker, and care management interventions.  Due to bladder management, bowel management, safety, skin/wound care, disease management, medication administration, pain management, and  patient education the patient requires 24 hour a day rehabilitation nursing.  The patient is currently min to mod assist overall with mobility and basic ADLs.  Discharge setting and therapy post discharge at home with home health is anticipated.  Patient has agreed to participate in the Acute Inpatient Rehabilitation Program and will admit today.   Preadmission Screen Completed By:  Cleatrice Burke, 02/18/2021 12:37 PM ______________________________________________________________________   Discussed status with Dr. Posey Pronto on 02/18/2021 at 1252 and received approval for admission today.   Admission Coordinator:  Cleatrice Burke, RN, time  1252 Date  02/18/2021    Assessment/Plan: Diagnosis: CVA Does the need for close, 24 hr/day Medical supervision in concert with the patient's rehab needs make it unreasonable for this patient to be served in a less intensive setting? Yes Co-Morbidities requiring supervision/potential complications: CAD with multiple prior PCI'S, HTN, OSA, Aflutter, V Tach with ICD, Chronic systolic HF. Recent left second digit amputation. Due to safety, skin/wound care, disease management, pain management, and patient education, does the patient require 24 hr/day rehab nursing? Yes Does the patient require coordinated care of a physician, rehab nurse, PT, OT to address physical and functional deficits in the context of the above medical diagnosis(es)? Yes Addressing deficits in the following areas: balance, endurance, locomotion, strength, transferring, bathing, dressing, toileting, and psychosocial support Can the patient actively participate in an intensive therapy program of at least 3 hrs of therapy 5 days a week? Yes The potential for patient to make measurable gains while on inpatient rehab is excellent Anticipated functional outcomes upon discharge from inpatient rehab: modified independent and supervision PT, modified independent and supervision OT, N/A with  SLP Estimated rehab length of stay to reach the above functional goals is: 10-14 days. Anticipated discharge destination: Home 10. Overall Rehab/Functional Prognosis: good     MD Signature: Delice Lesch, MD, ABPMR     Revision History                               Note Details  Author Jamse Arn, MD File Time 02/18/2021  2:17 PM  Author Type Physician Status Addendum  Last Editor Jamse Arn, MD Service Physical Medicine and [place] # [id number] [place] Date 02/18/2021

## 2021-02-18 NOTE — Care Management Important Message (Signed)
Important Message  Patient Details  Name: John [name] MRN: [id number] Date of Birth: May 05, 1951   Medicare Important Message Given:  Yes     Shelda Altes 02/18/2021, 11:10 AM

## 2021-02-18 NOTE — Progress Notes (Signed)
Discussed CRPII and NTG with pt. Receptive, will refer to [facility]. He is interested after his therapy for his CVA. [place], ACSM 1:31 PM 02/18/2021

## 2021-02-18 NOTE — Progress Notes (Signed)
Report given to RN on CIR, discussed with pt d/c instructions, no concerns noted, vs wnl, pt alert and oriented x4. Belongings given back to pt, IV d/c.

## 2021-02-18 NOTE — H&P (Signed)
Physical Medicine and Rehabilitation Admission H&P    Chief Complaint  Patient presents with   Functional deficits due to stroke.    HPI: John [name] is a 70 year old RH-male with history of HTN, T2DM, GERD, lumbar surgery w/L-foot drop, PAF-on Eliquis, VT s/p ICD/recent cardioversion, CAD s/p PTCI, infected left 2nd toe ulcer with oste s/p amputation 94/49, chronic systolic/diastolic History taken from patient and chart review. Discussed with Cards as well- significant improvement in LUE strength. CHF who was admitted on 02/14/2021 with onset of chest pain radiating to Left arm. He had elevated troponins due to NSTEMI and was started on IV heparin/NTG and taken to cath lab on 02/15/21 for cath with PTCA/PCI of instent thrombosis w/occlusion of proximal PL. Near completion of procedure, patient noted to be lethargic with hypoglycemia, BS 63, had left facial droop with left  neglect, left gaze preference and right sided weakness. CTA head/neck patent vasculature without LVO, occlusion or aneurysm as well as soft tissue lesion LUL concerning for malignancy.  He was not felt to be a tPA candidate as had received heparin and Plavix during procedure.  Repeat Echocardiogram showed EF of 40-45% with mildly decreased function and moderately dilated LV cavity. Follow-up MRI brain showed acute infarct in right thalamus, small punctate areas in bilateral frontal lobe and right parietal white matter suggestive of cerebral emboli. Dr. Leonie Man felt that patient had periprocederal embolic stroke and recommends triple therapy of Eliquis, ASA, Plavix for one month followed by aspirin and Eliquis thereafter.  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  Dr. Raeanne Barry consulted and  patient to follow-up with Dr. Valeta Harms.Podiatry has lowed up for evaluation of amp site and recommends follow up with Dr. Amalia Hailey after discharge for suture removal. PT/OT evaluations completed showing poor safety awareness with lack of insight into deficits, balance deficits with lateral lean, difficulty with high-level tasks as well as reports of double vision with dizziness.  CIR was recommended due to functional decline. Please see preadmission assessment form earlier today as well.   Review of Systems  Constitutional:  Negative for chills and fever.  HENT:  Negative for hearing loss.   Eyes:  Positive for double vision. Negative for blurred vision.  Respiratory:  Positive for cough (chronic--vocal cord issues?). Negative for shortness of breath.   Cardiovascular:  Negative for chest pain and palpitations.  Gastrointestinal:  Negative for abdominal pain, heartburn and nausea.  Genitourinary:  Negative for dysuria and urgency.  Musculoskeletal:  Positive for joint pain. Negative for myalgias and neck pain.  Skin:  Negative for rash.  Neurological:  Positive for dizziness and weakness. Negative for headaches.  Psychiatric/Behavioral:  The patient does not have insomnia.   All other systems reviewed and are negative.   Past Medical History:  Diagnosis Date   Atrial flutter ([place])    s/p cardioversion   Coronary artery disease    Diabetes mellitus    GERD (gastroesophageal reflux disease)    History of nuclear stress test 04/04/2011   lexiscan; mod-large in size fixed inferolateral defect (scar); non-diagnostic for ischemia; low risk scan    Hyperlipidemia    Hypertension    Left foot drop    r/t past disk srugery - uses Kevlar brace   Myocardial infarction ([place])    posterior  MI   Shortness of breath    Sleep apnea    on CPAP; 04/28/2007 split-night - AHI during total sleep 44.43/hr and REM 72.56/hr    Past Surgical History:  Procedure Laterality Date   AMPUTATION TOE Left 02/10/2021    Procedure: AMPUTATION  LEFT SECOND TOE;  Surgeon: Edrick Kins, DPM;  Location: [place];  Service: Podiatry;  Laterality: Left;   [place]  2010   6 stents total   CARDIAC CATHETERIZATION  01/2000   percutaneous transluminal coronary balloon angioplasty of mid RCA stenotic lesion   CARDIAC CATHETERIZATION  06/2006   no stenting; ischemic cardiomyopathy, EF 40-45%   CARDIOVERSION N/A 07/28/2016   Procedure: CARDIOVERSION;  Surgeon: Troy Sine, MD;  Location: [facility];  Service: Cardiovascular;  Laterality: N/A;   CORONARY ANGIOPLASTY  09/1998   mid-distal RCA balloon dilatation, 4.5 & 5.0 stents    CORONARY ANGIOPLASTY WITH STENT PLACEMENT  03/1994   angioplasty & stenting (non-DES) of circumflex/prox ramus intermedius   CORONARY ANGIOPLASTY WITH STENT PLACEMENT  10/1994   large iliac [id number] stent to RCA   [place]  12/2002   4.59mm stents x2 of RCA   CORONARY ANGIOPLASTY WITH STENT PLACEMENT  01/2005   cutting balloon arthrectomy of distal RCA & Cypher DES 3.5x13; cutting balloon arthrectomy of mid RCA with Cypher DES 3.5x18   CORONARY ANGIOPLASTY WITH STENT PLACEMENT  11/2008   stenting of mid RCA with 4.0x72mm driver, non-DES   CORONARY BALLOON ANGIOPLASTY N/A 02/15/2021   Procedure: CORONARY BALLOON ANGIOPLASTY;  Surgeon: Troy Sine, MD;  Location: [facility];  Service: Cardiovascular;  Laterality: N/A;   ICD IMPLANT N/A 08/20/2020   Procedure: ICD IMPLANT;  Surgeon: Constance Haw, MD;  Location: [facility];  Service: Cardiovascular;  Laterality: N/A;   INTRAVASCULAR PRESSURE WIRE/FFR STUDY N/A 03/02/2020   Procedure: INTRAVASCULAR PRESSURE WIRE/FFR STUDY;  Surgeon: Leonie Man, MD;  Location: [facility];  Service: Cardiovascular;  Laterality: N/A;   LEFT HEART CATH AND CORONARY ANGIOGRAPHY N/A 03/02/2020   Procedure: LEFT HEART CATH AND CORONARY ANGIOGRAPHY;  Surgeon: Leonie Man, MD;  Location: [facility];  Service: Cardiovascular;  Laterality: N/A;   LEFT HEART CATH AND CORONARY ANGIOGRAPHY N/A 08/19/2020   Procedure: LEFT HEART CATH AND CORONARY ANGIOGRAPHY;  Surgeon: Lorretta Harp, MD;  Location: [facility];  Service: Cardiovascular;  Laterality: N/A;   LEFT HEART CATH AND CORONARY ANGIOGRAPHY N/A 02/15/2021   Procedure: LEFT HEART CATH AND CORONARY ANGIOGRAPHY;  Surgeon: Troy Sine, MD;  Location: [place] [facility];  Service: Cardiovascular;  Laterality: N/A;   LEFT HEART CATHETERIZATION WITH CORONARY ANGIOGRAM N/A 02/27/2012   Procedure: LEFT HEART CATHETERIZATION WITH CORONARY ANGIOGRAM;  Surgeon: Lorretta Harp, MD;  Location: [facility];  Service: Cardiovascular;  Laterality: N/A;   TRANSTHORACIC ECHOCARDIOGRAM  07/29/2010   EF 50=55%, mod inf wall hypokinesis & mild post wall hypokinesis; LA mild-mod dilated; mild mitral annular calcif & mild MR; mild TR & elevated RV systolic pressure; AV mildly sclerotic; mild aortic root dilatation    V TACH ABLATION N/A 01/20/2021   Procedure: V TACH ABLATION;  Surgeon: Vickie Epley, MD;  Location: [place] [facility];  Service: Cardiovascular;  Laterality: N/A;    Family History  Problem Relation Age of Onset   Heart attack Father     Social History:  Married. 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 He  reports that he quit smoking about 3 1/2 years ago. His smoking use included cigarettes. He has a 50.00 pack-year smoking history. He has never used smokeless tobacco. He reports that he does not currently use alcohol. He reports that he does not use drugs.   Allergies  Allergen Reactions   Omega-3 Fatty Acids Hives and Itching   Benazepril Other (See Comments)    hyperkalemia   Fish Allergy Itching    Medications Prior to Admission  Medication Sig Dispense Refill   acetaminophen (TYLENOL) 500 MG tablet Take 500 mg by mouth every 6 (six)  hours as needed for moderate pain.     albuterol (VENTOLIN HFA) 108 (90 Base) MCG/ACT inhaler Inhale 2 puffs into the lungs every 6 (six) hours as needed for wheezing or shortness of breath. 1 each 6   cephALEXin (KEFLEX) 500 MG capsule Take 1 capsule (500 mg total) by mouth every 8 (eight) hours for 7 days. (Patient taking differently: Take 500 mg by mouth every 8 (eight) hours. Start date :02/11/21) 21 capsule 0   ELIQUIS 5 MG TABS tablet TAKE 1 TABLET BY MOUTH TWICE A DAY (Patient taking differently: Take 5 mg by mouth 2 (two) times daily.) 180 tablet 1   ENTRESTO 24-26 MG TAKE 1 TABLET BY MOUTH TWICE A DAY (Patient taking differently: Take 1 tablet by mouth 2 (two) times daily.) 60 tablet 6   Fluticasone Furoate (ARNUITY ELLIPTA) 200 MCG/ACT AEPB Inhale 1 puff into the lungs daily. 30 each 6   insulin aspart (NOVOLOG FLEXPEN) 100 UNIT/ML FlexPen INJECT 5-20 UNITS SUBCUTANEOUSLY WITH LUNCH AND DINNER (Patient taking differently: Inject 6-8 Units into the skin daily as needed for high blood sugar.) 15 mL 1   JARDIANCE 25 MG TABS tablet TAKE 1 TABLET BY MOUTH EVERY DAY (Patient taking differently: Take 25 mg by mouth daily.) 30 tablet 5   ketoconazole (NIZORAL) 2 % shampoo Apply 1 application topically 2 (two) times a week.     LEVEMIR FLEXTOUCH 100 UNIT/ML FlexPen INJECT 46 UNITS INTO THE SKIN DAILY. DX:E11.9 (Patient taking differently: Inject 46 Units into the skin daily.) 15 mL 3   metoprolol succinate (TOPROL-XL) 100 MG 24 hr tablet Take 1 tablet (100 mg total) by mouth daily. Take with or immediately following a meal. (Patient taking differently: Take 100 mg by mouth daily.) 90 tablet 1   mexiletine (MEXITIL) 150 MG capsule Take 2 capsules (300 mg total) by mouth every 12 (twelve) hours. (Patient taking differently: Take 150 mg by mouth 2 (two) times daily.) 120 capsule 5   montelukast (SINGULAIR) 10 MG tablet TAKE 1 TABLET BY MOUTH EVERY DAY (Patient taking differently: Take 10 mg by mouth  daily.) 90 tablet 3   nitroGLYCERIN (NITROSTAT) 0.4 MG SL tablet PLACE 1 TABLET (0.4 MG TOTAL) UNDER THE TONGUE EVERY 5 (FIVE) MINUTES AS NEEDED FOR CHEST PAIN. 25 tablet 1   rosuvastatin (CRESTOR) 20 MG tablet TAKE 1 TABLET BY MOUTH EVERY DAY (Patient taking differently: Take 20 mg by mouth daily.) 90 tablet 3   sotalol (BETAPACE) 160 MG tablet Take 1 tablet (160 mg total) by mouth 2 (two) times daily. 60 tablet 6   glucose blood (ONETOUCH ULTRA) test strip USE TO CHECK BLOOD SUGAR 3 TIMES A DAY (E11.9) 100 strip 2   HYDROcodone-acetaminophen (NORCO/VICODIN) 5-325 MG tablet Take 1 tablet by mouth every 6 (six) hours as needed for severe  pain. (Patient not taking: No sig reported) 10 tablet 0   Insulin Pen Needle (NOVOFINE) 32G X 6 MM MISC 1 each by Other route daily. 100 each 3    Drug Regimen Review  Drug regimen was reviewed and remains appropriate with no significant issues identified  Home: Home Living Family/patient expects to be discharged to:: Private residence Living Arrangements: Spouse/significant other Available Help at Discharge: Family, Available 24 hours/day (Pt takes care of wife. Daughter and granddaughter currently providing assist for wife) Type of Home: House Home Access: Stairs to enter, Ramped entrance Entrance Stairs-Number of Steps: 5 STE in the front, home made ramp in the back for the dogs primarily, but he did go up and down.  Not ADA compliant. Entrance Stairs-Rails: Left Home Layout: One level Bathroom Shower/Tub: Multimedia programmer: Standard Bathroom Accessibility: Yes Home Equipment: Shower seat, Grab bars - tub/shower, Hand held shower head, Other (comment) Additional Comments: Pt has lt AFO but states he doesn't usually use it   Functional History: Prior Function Level of Independence: Independent  Functional Status:  Mobility: Bed Mobility Overal bed mobility: Needs Assistance Bed Mobility: Supine to Sit Supine to sit:  Supervision General bed mobility comments: Assist for safety Transfers Overall transfer level: Needs assistance Equipment used: Rolling walker (2 wheeled) Transfers: Sit to/from Stand Sit to Stand: Min assist Stand pivot transfers: Mod assist General transfer comment: assist to bring hips up and for balance. Pt with lt lateral lean Ambulation/Gait Ambulation/Gait assistance: Mod assist Gait Distance (Feet): 60 Feet Assistive device: Rolling walker (2 wheeled) Gait Pattern/deviations: Step-through pattern, Decreased step length - left, Steppage General Gait Details: Assist for balance and support. Pt with steppage gait on the left which is baseline for pt. Pt with left lateral lean and at times has lt foot outside of the walker. Verbal cues to stand more erect and to keep feet inside the walker. As pt approaches a seat he tends to leave walker and reach for the chair before he is all the way turned to the chair. Gait velocity: decr Gait velocity interpretation: <1.31 ft/sec, indicative of household ambulator    ADL: ADL Overall ADL's : Needs assistance/impaired Eating/Feeding: Set up, Sitting Grooming: Wash/dry hands, Min guard, Standing Upper Body Bathing: Supervision/ safety, Sitting, Standing Lower Body Bathing: Moderate assistance, Sit to/from stand Lower Body Bathing Details (indicate cue type and reason): poor stand dynamic balance Upper Body Dressing : Supervision/safety, Sitting Lower Body Dressing: Moderate assistance, Sit to/from stand Toilet Transfer: Moderate assistance, Ambulation, Regular Toilet, RW Toileting- Clothing Manipulation and Hygiene: Supervision/safety, Sitting/lateral lean Functional mobility during ADLs: Moderate assistance, Rolling walker General ADL Comments: decreased safety, leaning to the L, double vision and dizziness noted.  Cognition: Cognition Overall Cognitive Status: Impaired/Different from baseline Orientation Level: Oriented  X4 Cognition Arousal/Alertness: Awake/alert Behavior During Therapy: Impulsive Overall Cognitive Status: Impaired/Different from baseline Area of Impairment: Memory, Safety/judgement, Awareness, Problem solving Memory: Decreased short-term memory, Decreased recall of precautions Following Commands: Follows one step commands consistently Safety/Judgement: Decreased awareness of safety, Decreased awareness of deficits Awareness: Emergent Problem Solving: Requires verbal cues General Comments: Pt stating, "I am okay. I would let you know if I wasn't." Pt stated this as he was trying to stand on his own in the bathroom despite being told not too.   Blood pressure 102/66, pulse 70, temperature (!) 97.4 F (36.3 C), temperature source Oral, resp. rate 18, height 6' (1.829 m), weight 94.2 kg, SpO2 95 %. Physical Exam Vitals and nursing note reviewed.  Constitutional:      Appearance: Normal appearance. He is well-developed. He is obese.  HENT:     Head: Normocephalic and atraumatic.     Right Ear: External ear normal.     Left Ear: External ear normal.     Nose: Nose normal.  Eyes:     General:        Right eye: No discharge.        Left eye: No discharge.  Cardiovascular:     Comments: Irregularly irregular Pulmonary:     Effort: Pulmonary effort is normal.     Breath sounds: Normal breath sounds.  Abdominal:     General: There is no distension.     Palpations: Abdomen is soft.  Musculoskeletal:     Cervical back: Normal range of motion and neck supple.     Comments: No edema or tenderness in extremities  Skin:    General: Skin is warm and dry.  Neurological:     Mental Status: He is alert and oriented to person, place, and time.     Comments: Alert Mild dysphonia with occasional hoarseness and mild left facial weakness.  He was able to follow commands without difficulty.  Dysconjugate gaze and left eye slow to initiate movement.  Motor: Right upper extremity: 5/5 proximal  distal Right lower extremity: 5/5 proximal distal Left upper extremity: 4+/5 proximal distally left lower extremity: Flexion, knee extension 1+/5, ankle dorsiflexion 0/5 Sensation intact light touch  Psychiatric:        Mood and Affect: Mood normal.        Behavior: Behavior normal.    Results for orders placed or performed during the hospital encounter of 02/14/21 (from the past 48 hour(s))  Glucose, capillary     Status: Abnormal   Collection Time: 02/16/21 11:34 AM  Result Value Ref Range   Glucose-Capillary 112 (H) 70 - 99 mg/dL    Comment: Glucose reference range applies only to samples taken after fasting for at least 8 hours.  Glucose, capillary     Status: Abnormal   Collection Time: 02/16/21  4:39 PM  Result Value Ref Range   Glucose-Capillary 122 (H) 70 - 99 mg/dL    Comment: Glucose reference range applies only to samples taken after fasting for at least 8 hours.  Glucose, capillary     Status: Abnormal   Collection Time: 02/16/21  9:36 PM  Result Value Ref Range   Glucose-Capillary 156 (H) 70 - 99 mg/dL    Comment: Glucose reference range applies only to samples taken after fasting for at least 8 hours.   Comment 1 Notify RN    Comment 2 Document in Chart   Glucose, capillary     Status: Abnormal   Collection Time: 02/17/21  6:16 AM  Result Value Ref Range   Glucose-Capillary 112 (H) 70 - 99 mg/dL    Comment: Glucose reference range applies only to samples taken after fasting for at least 8 hours.   Comment 1 Notify RN    Comment 2 Document in Chart   Hemoglobin A1c     Status: Abnormal   Collection Time: 02/17/21  8:23 AM  Result Value Ref Range   Hgb A1c MFr Bld 7.6 (H) 4.8 - 5.6 %    Comment: (NOTE) Pre diabetes:          5.7%-6.4%  Diabetes:              >6.4%  Glycemic control for   <7.0% adults with  diabetes    Mean Plasma Glucose 171.42 mg/dL    Comment: Performed at [facility] [address], [place], [place] [address]  Glucose, capillary      Status: Abnormal   Collection Time: 02/17/21 11:31 AM  Result Value Ref Range   Glucose-Capillary 112 (H) 70 - 99 mg/dL    Comment: Glucose reference range applies only to samples taken after fasting for at least 8 hours.  Glucose, capillary     Status: Abnormal   Collection Time: 02/17/21  4:07 PM  Result Value Ref Range   Glucose-Capillary 178 (H) 70 - 99 mg/dL    Comment: Glucose reference range applies only to samples taken after fasting for at least 8 hours.  Glucose, capillary     Status: Abnormal   Collection Time: 02/17/21  9:12 PM  Result Value Ref Range   Glucose-Capillary 125 (H) 70 - 99 mg/dL    Comment: Glucose reference range applies only to samples taken after fasting for at least 8 hours.  Glucose, capillary     Status: Abnormal   Collection Time: 02/18/21  6:00 AM  Result Value Ref Range   Glucose-Capillary 138 (H) 70 - 99 mg/dL    Comment: Glucose reference range applies only to samples taken after fasting for at least 8 hours.   CT CHEST WO CONTRAST  Result Date: 02/16/2021 CLINICAL DATA:  Lung nodule EXAM: CT CHEST WITHOUT CONTRAST TECHNIQUE: Multidetector CT imaging of the chest was performed following the standard protocol without IV contrast. COMPARISON:  08/15/2020 FINDINGS: Cardiovascular: Left fibrillator noted with proximal and distal leads in the right atrium and right ventricle, respectively. Coronary, aortic arch, and branch vessel atherosclerotic vascular disease. Mild cardiomegaly with fat density along the lateral wall the left ventricle possibly result of prior myocardial infarction. The ascending thoracic aorta measures 4.2 cm in diameter. Mediastinum/Nodes: Unremarkable Lungs/Pleura: Scattered chronically stable pulmonary nodules up to 0.6 cm in diameter observed. Along the upper margin of a prior tree-in-bud cluster of nodularity in the left upper lobe there is a dominant pulmonary nodule which has substantially enlarged compared to 07/26/2020, currently  measuring 22 by 19 by 19 mm (volume = 4200 mm^3), and previously measuring 6 by 4 by 5 mm (volume = 60 mm^3). Upper Abdomen: Fluid density exophytic lesion from the right kidney upper pole compatible with cyst. Musculoskeletal: Thoracic spondylosis with multilevel bridging spurring. IMPRESSION: 1. Along a cluster of tree-in-bud nodularity in the left upper lobe, a previous 60 cubic mm nodule currently measures 4200 cubic mm, averaging 20 mm in diameter. Although proximity to the tree-in-bud nodularity raises the possibility that this represents atypical infection such as MAI, the nodule growth is clearly different from the other nodules and raises the possibility of malignancy. Tissue diagnosis is recommended when feasible. 2. Ascending thoracic aorta measures 4.2 cm in diameter, stable by my measurements. Recommend annual imaging followup by CTA or MRA. This recommendation follows 2010 ACCF/AHA/AATS/ACR/ASA/SCA/SCAI/SIR/STS/SVM Guidelines for the Diagnosis and Management of Patients with Thoracic Aortic Disease. Circulation. 2010; 121: U045-W098. Aortic aneurysm NOS (ICD10-I71.9) 3. Other imaging findings of potential clinical significance: Aortic Atherosclerosis (ICD10-I70.0). Coronary atherosclerosis. Mild cardiomegaly. Electronically Signed   By: Van Clines M.D.   On: 02/16/2021 18:57   MR BRAIN WO CONTRAST  Result Date: 02/16/2021 CLINICAL DATA:  Acute neuro deficit.  Suspect stroke. EXAM: MRI HEAD WITHOUT CONTRAST TECHNIQUE: Multiplanar, multiecho pulse sequences of the brain and surrounding structures were obtained without intravenous contrast. COMPARISON:  CT head 02/15/2021 FINDINGS:  Brain: Acute infarct right medial thalamus measuring approximately 10 x 15 mm. Small areas of acute infarct in the frontal cortex bilaterally over the convexity and in the right parietal white matter. Mild atrophy without hydrocephalus. Negative for hemorrhage or mass. Vascular: Normal arterial flow voids Skull and  upper cervical spine: No focal skeletal lesion. Sinuses/Orbits: Mucosal edema paranasal sinuses. Retention cyst right maxillary sinus. Bilateral cataract surgery Other: None IMPRESSION: Acute infarct right thalamus. Small punctate areas of acute infarct in the frontal lobes bilaterally and right parietal white matter. Findings suggest cerebral emboli. Electronically Signed   By: Franchot Gallo M.D.   On: 02/16/2021 13:25       Medical Problem List and Plan: 1.  poor safety awareness with lack of insight into deficits, balance deficits with lateral lean, difficulty with high-level tasks as well as reports of double vision with dizziness secondary to right thalamic infarct, bilateral punctate frontal lobe infarcts - embolic.  -patient may not shower  -ELOS/Goals: 8-12 days/Supervision/mod I  Admit to CIR 2.  Antithrombotics: -DVT/anticoagulation:  Pharmaceutical: Other (comment)--Eliquis indefinitely   -antiplatelet therapy: ASA/Plavix. -->Dr. Ellyn Hack recommends triple drug therapy with discontinuation of ASA after a month? Plavix for minimum 6-12 months.  3. Pain Management:  4. Mood: LCSW to follow for evaluation  and support.   -antipsychotic agents: N/a 5. Neuropsych: This patient is capable of making decisions on his own behalf. 6. Skin/Wound Care: Routine pressure relief measures 7. Fluids/Electrolytes/Nutrition: Monitor I/Os.  CMP ordered for tomorrow AM 8.  CAD/NSTEMI s/p PTCA: ON ASA, Plavix, Crestor, metoprolol   --monitor for symptoms with increase in activity.  9. H/o PAF/VT s/p ICD/ablation 8/22: Monitor HR TID and for any symptoms  --On Betapace, [name] and Metoprolol 10. T2DM: Hgb A1C-7.6.  On Jardiance and Levemir with SSI for tighter control Monitor BS ac/hs.  Monitor with increased mobility  11. HTN: Monitor BP tid --continue Enstresto, metoprolol and Betapace   Monitor with increased mobility 12. Chronic combined CHF:  EF 40-45% --evolemic. Monitor for signs of  overload.  Daily weights  --On Ferne Coe and Statin.  13. Osteomyelitis s/p L-2nd toe amp: ON Keflex for group B strep thorough 09/02.  14. COPD/Enlargening LUL mass: Continue Singulair, Budesonide nebs BID and albuterol prn SOB  --Has appt with Dr. Valeta Harms on 03/30/21 at 3:30 pm.   Bary Leriche, PA-C 02/18/2021  I have personally performed a face to face diagnostic evaluation, including, but not limited to relevant history and physical exam findings, of this patient and developed relevant assessment and plan.  Additionally, I have reviewed and concur with the physician assistant's documentation above.  Delice Lesch, MD, ABPMR

## 2021-02-18 NOTE — Plan of Care (Signed)
  Problem: Activity: Goal: Ability to return to baseline activity level will improve Outcome: Progressing   Problem: Clinical Measurements: Goal: Respiratory complications will improve Outcome: Progressing   Problem: Nutrition: Goal: Adequate nutrition will be maintained Outcome: Progressing   Problem: Coping: Goal: Level of anxiety will decrease Outcome: Progressing

## 2021-02-19 DIAGNOSIS — I63439 Cerebral infarction due to embolism of unspecified posterior cerebral artery: Secondary | ICD-10-CM

## 2021-02-19 LAB — CBC WITH DIFFERENTIAL/PLATELET
Abs Immature Granulocytes: 0.05 10*3/uL (ref 0.00–0.07)
Basophils Absolute: 0.1 10*3/uL (ref 0.0–0.1)
Basophils Relative: 1 %
Eosinophils Absolute: 0.3 10*3/uL (ref 0.0–0.5)
Eosinophils Relative: 5 %
HCT: 50.1 % (ref 39.0–52.0)
Hemoglobin: 16.3 g/dL (ref 13.0–17.0)
Immature Granulocytes: 1 %
Lymphocytes Relative: 28 %
Lymphs Abs: 1.7 10*3/uL (ref 0.7–4.0)
MCH: 30 pg (ref 26.0–34.0)
MCHC: 32.5 g/dL (ref 30.0–36.0)
MCV: 92.3 fL (ref 80.0–100.0)
Monocytes Absolute: 0.4 10*3/uL (ref 0.1–1.0)
Monocytes Relative: 6 %
Neutro Abs: 3.6 10*3/uL (ref 1.7–7.7)
Neutrophils Relative %: 59 %
Platelets: 210 10*3/uL (ref 150–400)
RBC: 5.43 MIL/uL (ref 4.22–5.81)
RDW: 13.8 % (ref 11.5–15.5)
WBC: 6.1 10*3/uL (ref 4.0–10.5)
nRBC: 0 % (ref 0.0–0.2)

## 2021-02-19 LAB — COMPREHENSIVE METABOLIC PANEL
ALT: 17 U/L (ref 0–44)
AST: 16 U/L (ref 15–41)
Albumin: 2.8 g/dL — ABNORMAL LOW (ref 3.5–5.0)
Alkaline Phosphatase: 69 U/L (ref 38–126)
Anion gap: 7 (ref 5–15)
BUN: 14 mg/dL (ref 8–23)
CO2: 25 mmol/L (ref 22–32)
Calcium: 8.7 mg/dL — ABNORMAL LOW (ref 8.9–10.3)
Chloride: 110 mmol/L (ref 98–111)
Creatinine, Ser: 1.01 mg/dL (ref 0.61–1.24)
GFR, Estimated: >60 mL/min (ref >60)
Glucose, Bld: 126 mg/dL — ABNORMAL HIGH (ref 70–99)
Potassium: 3.8 mmol/L (ref 3.5–5.1)
Sodium: 142 mmol/L (ref 135–145)
Total Bilirubin: 0.6 mg/dL (ref 0.3–1.2)
Total Protein: 5.4 g/dL — ABNORMAL LOW (ref 6.5–8.1)

## 2021-02-19 LAB — GLUCOSE, CAPILLARY
Glucose-Capillary: 140 mg/dL — ABNORMAL HIGH (ref 70–99)
Glucose-Capillary: 118 mg/dL — ABNORMAL HIGH (ref 70–99)
Glucose-Capillary: 173 mg/dL — ABNORMAL HIGH (ref 70–99)
Glucose-Capillary: 188 mg/dL — ABNORMAL HIGH (ref 70–99)

## 2021-02-19 MED ORDER — METOPROLOL SUCCINATE ER 50 MG PO TB24
87.5000 mg | ORAL_TABLET | Freq: Every day | ORAL | Status: DC
  Administered 2021-02-20 – 2021-02-23 (×4): 87.5 mg via ORAL
  Filled 2021-02-19 (×5): qty 2

## 2021-02-19 MED ORDER — HYDROCORTISONE 1 % EX CREA
TOPICAL_CREAM | Freq: Two times a day (BID) | CUTANEOUS | Status: DC
  Filled 2021-02-19: qty 28

## 2021-02-19 MED ORDER — HYDROCORTISONE 1 % EX LOTN
TOPICAL_LOTION | Freq: Two times a day (BID) | CUTANEOUS | Status: DC
  Filled 2021-02-19: qty 118

## 2021-02-19 NOTE — Evaluation (Signed)
Occupational Therapy Assessment and Plan  Patient Details  Name: John [name] MRN: [id number] Date of Birth: Jul 11, 1950  OT Diagnosis: hemiplegia affecting non-dominant side, muscle weakness (generalized), and impaired vision, decreased dynamic standing balance, impaired safety awareness/mild Rehab Potential: Rehab Potential (ACUTE ONLY): Good ELOS: 7 to 10 days   Today's Date: 02/19/2021 OT Individual Time: 0700-0757 OT Individual Time Calculation (min): 57 min   + 50 min  Hospital Problem: Principal Problem:   Embolic stroke [facility])   Past Medical History:  Past Medical History:  Diagnosis Date   Atrial flutter ([place])    s/p cardioversion   Coronary artery disease    Diabetes mellitus    GERD (gastroesophageal reflux disease)    History of nuclear stress test 04/04/2011   lexiscan; mod-large in size fixed inferolateral defect (scar); non-diagnostic for ischemia; low risk scan    Hyperlipidemia    Hypertension    Left foot drop    r/t past disk srugery - uses Kevlar brace   Myocardial infarction ([place])    posterior MI   Shortness of breath    Sleep apnea    on CPAP; 04/28/2007 split-night - AHI during total sleep 44.43/hr and REM 72.56/hr   Past Surgical History:  Past Surgical History:  Procedure Laterality Date   AMPUTATION TOE Left 02/10/2021   Procedure: AMPUTATION  LEFT SECOND TOE;  Surgeon: Edrick Kins, DPM;  Location: [place];  Service: Podiatry;  Laterality: Left;   [place]  2010   6 stents total   CARDIAC CATHETERIZATION  01/2000   percutaneous transluminal coronary balloon angioplasty of mid RCA stenotic lesion   CARDIAC CATHETERIZATION  06/2006   no stenting; ischemic cardiomyopathy, EF 40-45%   CARDIOVERSION N/A 07/28/2016   Procedure: CARDIOVERSION;  Surgeon: Troy Sine, MD;  Location: [place];  Service: Cardiovascular;  Laterality: N/A;   CORONARY ANGIOPLASTY  09/1998   mid-distal RCA balloon dilatation, 4.5 &  5.0 stents    CORONARY ANGIOPLASTY WITH STENT PLACEMENT  03/1994   angioplasty & stenting (non-DES) of circumflex/prox ramus intermedius   CORONARY ANGIOPLASTY WITH STENT PLACEMENT  10/1994   large iliac [id number] stent to RCA   [place]  12/2002   4.24m stents x2 of RCA   CORONARY ANGIOPLASTY WITH STENT PLACEMENT  01/2005   cutting balloon arthrectomy of distal RCA & Cypher DES 3.5x13; cutting balloon arthrectomy of mid RCA with Cypher DES 3.5x18   CORONARY ANGIOPLASTY WITH STENT PLACEMENT  11/2008   stenting of mid RCA with 4.0x137mdriver, non-DES   CORONARY BALLOON ANGIOPLASTY N/A 02/15/2021   Procedure: CORONARY BALLOON ANGIOPLASTY;  Surgeon: KeTroy SineMD;  [place];  Service: Cardiovascular;  Laterality: N/A;   ICD IMPLANT N/A 08/20/2020   Procedure: ICD IMPLANT;  Surgeon: CaConstance HawMD;  [place] [facility] [place];  Service: Cardiovascular;  Laterality: N/A;   INTRAVASCULAR PRESSURE WIRE/FFR STUDY N/A 03/02/2020   Procedure: INTRAVASCULAR PRESSURE WIRE/FFR STUDY;  Surgeon: HaLeonie ManMD;  [place];  Service: Cardiovascular;  Laterality: N/A;   LEFT HEART CATH AND CORONARY ANGIOGRAPHY N/A 03/02/2020   Procedure: LEFT HEART CATH AND CORONARY ANGIOGRAPHY;  Surgeon: HaLeonie ManMD;  [place] [facility];  Service: Cardiovascular;  Laterality: N/A;   LEFT HEART CATH AND CORONARY ANGIOGRAPHY N/A 08/19/2020   Procedure: LEFT HEART CATH AND CORONARY ANGIOGRAPHY;  Surgeon: BeLorretta HarpMD;  Location: [place] CV LAB;  Service: Cardiovascular;  Laterality: N/A;   LEFT HEART CATH AND CORONARY ANGIOGRAPHY N/A 02/15/2021   Procedure: LEFT HEART CATH AND CORONARY ANGIOGRAPHY;  Surgeon: Troy Sine, MD;  Location: [place] CV LAB;  Service: Cardiovascular;  Laterality: N/A;   LEFT HEART CATHETERIZATION WITH CORONARY ANGIOGRAM N/A 02/27/2012   Procedure: LEFT HEART CATHETERIZATION WITH  CORONARY ANGIOGRAM;  Surgeon: Lorretta Harp, MD;  Location: [facility] C[facility] LAB;  Service: Cardiovascular;  Laterality: N/A;   TRANSTHORACIC ECHOCARDIOGRAM  07/29/2010   EF 50=55%, mod inf wall hypokinesis & mild post wall hypokinesis; LA mild-mod dilated; mild mitral annular calcif & mild MR; mild TR & elevated RV systolic pressure; AV mildly sclerotic; mild aortic root dilatation    V TACH ABLATION N/A 01/20/2021   Procedure: V TACH ABLATION;  Surgeon: Vickie Epley, MD;  Location: [place] CV LAB;  Service: Cardiovascular;  Laterality: N/A;    Assessment & Plan Clinical Impression: Patient is a 70 y.o. year old male with  history of HTN, T2DM, GERD, lumbar surgery w/L-foot drop, PAF-on Eliquis, VT s/p ICD/recent cardioversion, CAD s/p PTCI, infected left 2nd toe ulcer with oste s/p amputation 62/56, chronic systolic/diastolic History taken from patient and chart review. Discussed with Cards as well- significant improvement in LUE strength. CHF who was admitted on 02/14/2021 with onset of chest pain radiating to Left arm. He had elevated troponins due to NSTEMI and was started on IV heparin/NTG and taken to cath lab on 02/15/21 for cath with PTCA/PCI of instent thrombosis w/occlusion of proximal PL. Near completion of procedure, patient noted to be lethargic with hypoglycemia, BS 63, had left facial droop with left  neglect, left gaze preference and right sided weakness. CTA head/neck patent vasculature without LVO, occlusion or aneurysm as well as soft tissue lesion LUL concerning for malignancy.  He was not felt to be a tPA candidate as had received heparin and Plavix during procedure.   Repeat Echocardiogram showed EF of 40-45% with mildly decreased function and moderately dilated LV cavity. Follow-up MRI brain showed acute infarct in right thalamus, small punctate areas in bilateral frontal lobe and right parietal white matter suggestive of cerebral emboli. Dr. Leonie Man felt that patient had  periprocederal embolic stroke and recommends triple therapy of Eliquis, ASA, Plavix for one month followed by aspirin and Eliquis thereafter.  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  Dr. Raeanne Barry consulted and patient to follow-up with Dr. Valeta Harms.Podiatry has lowed up for evaluation of amp site and recommends follow up with Dr. Amalia Hailey after discharge for suture removal. PT/OT evaluations completed showing poor safety awareness with lack of insight into deficits, balance deficits with lateral lean, difficulty with high-level tasks as well as reports of double vision with dizziness.  CIR was recommended due to functional decline. Please see preadmission assessment form earlier today as well.  Patient transferred to CIR on 02/18/2021 .    Patient currently requires  CGA  with basic self-care skills and IADL secondary to muscle weakness, decreased cardiorespiratoy endurance, impaired timing and sequencing, unbalanced muscle activation, decreased coordination, and decreased motor planning, blurriness/diplopia of vision, decreased safety awareness, and decreased standing balance, decreased postural control, hemiplegia, and decreased balance strategies.  Prior to hospitalization, patient could complete ADL/IADL with modified independent .  Patient  will benefit from skilled intervention to increase independence with basic self-care skills and increase level of independence with iADL prior to discharge home with care partner.  Anticipate patient will require  no S  and follow up outpatient.  OT - End of Session Activity Tolerance: Tolerates 10 - 20 min activity with multiple rests Endurance Deficit: Yes Endurance Deficit Description: fatigued after stairs OT Assessment Rehab  Potential (ACUTE ONLY): Good OT Patient demonstrates impairments in the following area(s): Balance;Cognition;Endurance;Safety;Motor;Vision OT Basic ADL's Functional Problem(s): Grooming;Bathing;Dressing;Toileting OT Advanced ADL's Functional Problem(s): Simple Meal Preparation;Light Housekeeping OT Transfers Functional Problem(s): Toilet;Tub/Shower OT Additional Impairment(s): Fuctional Use of Upper Extremity OT Plan OT Intensity: Minimum of 1-2 x/day, 45 to 90 minutes OT Frequency: 5 out of 7 days OT Duration/Estimated Length of Stay: 7 to 10 days OT Treatment/Interventions: Balance/vestibular training;Disease mangement/prevention;Neuromuscular re-education;Self Care/advanced ADL retraining;Therapeutic Exercise;Wheelchair propulsion/positioning;UE/LE Strength taining/ROM;Skin care/wound managment;Pain management;DME/adaptive equipment instruction;Cognitive remediation/compensation;UE/LE Coordination activities;Patient/family education;Functional electrical stimulation;Community reintegration;Discharge planning;Functional mobility training;Psychosocial support;Therapeutic Activities;Visual/perceptual remediation/compensation OT Self Feeding Anticipated Outcome(s): ind OT Basic Self-Care Anticipated Outcome(s): mod I OT Toileting Anticipated Outcome(s): mod I OT Bathroom Transfers Anticipated Outcome(s): mod I OT Recommendation Patient destination: Home Follow Up Recommendations: Outpatient OT Equipment Recommended: To be determined   OT Evaluation Precautions/Restrictions  Precautions Precautions: Fall Precaution Comments: L foot drop at baseline, imapired vision, 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 Daughter and granddaughter currently providing assist for wife; wife can provide S but no physical assist) Type of Home: House Home Access: Stairs to enter, Ramped entrance Entrance Stairs-Number of Steps: 4 STE in the front, home made ramp in the back for the dogs primarily, but he did go up and down.  R rail. Not ADA compliant. Entrance Stairs-Rails: Left Home Layout: One level Bathroom Shower/Tub: Multimedia programmer: Standard Bathroom Accessibility: Yes Additional Comments: spouse has a wheelchair  Lives With: Spouse IADL History Homemaking Responsibilities: Yes Meal Prep Responsibility: Primary Laundry Responsibility: Primary Cleaning Responsibility: Primary Bill Paying/Finance Responsibility: Primary Shopping Responsibility: Primary Child Care Responsibility: No Prior Function Level of Independence: Independent with homemaking with ambulation, Requires assistive device for independence (used walking stick PRN in/outside house)  Able to Take Stairs?: Yes Driving: Yes (reports having driving restrictions through Dec. post cardiac procedures, but "drives if I need to") Vocation: Retired Comments: owns a home made walking stick Vision Baseline Vision/History: 1 Wears glasses (readers) Ability to See in Adequate Light: 0 Adequate (with readers) Patient Visual Report: Diplopia;Other (comment) ("moving" vision) Vision Assessment?: Yes Eye Alignment: Impaired (comment) (dysconjugate gaze L) Ocular Range of Motion: Restricted on the left Alignment/Gaze Preference: Head tilt;Gaze left Tracking/Visual Pursuits: Decreased smoothness of eye movement to LEFT inferior field;Decreased smoothness of eye movement to LEFT superior field (reports worsening diplopia in RUQ) Saccades: Decreased speed of saccadic movement Convergence: Impaired (comment) Visual Fields: No  apparent deficits Diplopia Assessment: Other (comment);Present in far gaze (present RUQ) Perception  Perception: Within Functional Limits Praxis Praxis: Intact Cognition Overall Cognitive Status: Impaired/Different from baseline Orientation Level: Person;Place;Situation Person: Oriented Place: Oriented Situation: Oriented Year: 2022 Month: August Day of Week: Incorrect Memory: Appears intact Immediate Memory Recall: Sock;Bed;Blue Memory Recall Sock: Without Cue Memory Recall Blue: Without Cue Memory Recall  Bed: Without Cue Problem Solving: Appears intact Safety/Judgment: Impaired Comments: reports driving despite driving restrictions, mild posterior / L lean denying need for assist during transfers Sensation Sensation Light Touch: Appears Intact Hot/Cold: Not tested Proprioception: Appears Intact Stereognosis: Appears Intact Coordination Gross Motor Movements are Fluid and Coordinated: No Fine Motor Movements are Fluid and Coordinated: No Coordination and Movement Description: mild L hemi, L foot drop baseline and recent L 2nd toe amputation Finger Nose Finger Test: slowed speed on L Heel Shin Test: WNLs bil Motor  Motor Motor: Abnormal postural alignment and control;Hemiplegia Motor - Skilled Clinical Observations: mild L hemi, L foot drop baseline and recent L 2nd toe amputation  Trunk/Postural Assessment  Cervical Assessment Cervical Assessment: Within Functional Limits Thoracic Assessment Thoracic Assessment: Exceptions to [facility] (rounded shoulders) Lumbar Assessment Lumbar Assessment: Within Functional Limits Postural Control Postural Control: Deficits on evaluation Righting Reactions: delayed ; leans L intermittently  Balance Balance Balance Assessed: Yes Static Sitting Balance Static Sitting - Balance Support: No upper extremity supported Static Sitting - Level of Assistance: 5: Stand by assistance Dynamic Sitting Balance Dynamic Sitting - Balance Support:  No upper extremity supported Dynamic Sitting - Level of Assistance: 5: Stand by assistance Reach (Patient is able to reach ___ inches to right, left, forward, back): 12 Dynamic Sitting - Balance Activities: Lateral lean/weight shifting;Forward lean/weight shifting;Reaching for objects Static Standing Balance Static Standing - Balance Support: Left upper extremity supported;During functional activity Static Standing - Level of Assistance: 4: Min assist Dynamic Standing Balance Dynamic Standing - Balance Support: Bilateral upper extremity supported;During functional activity Dynamic Standing - Level of Assistance: 5: Stand by assistance Dynamic Standing - Balance Activities: Lateral lean/weight shifting Dynamic Standing - Comments: CGA Extremity/Trunk Assessment RUE Assessment RUE Assessment: Within Functional Limits LUE Assessment LUE Assessment: Exceptions to [facility] General Strength Comments: 4/5 shoulder flexion LUE Body System: Neuro Brunstrum levels for arm and hand: Arm;Hand Brunstrum level for arm: Stage V Relative Independence from Synergy Brunstrum level for hand: Stage VI Isolated joint movements  Care Tool Care Tool Self Care Eating   Eating Assist Level: Independent    Oral Care    Oral Care Assist Level: Contact Guard/Toucning assist (standing)    Bathing   Body parts bathed by patient: Face;Right arm;Left arm;Front perineal area;Abdomen;Chest;Buttocks;Right upper leg;Left upper leg;Right lower leg;Left lower leg     Assist Level: Contact Guard/Touching assist    Upper Body Dressing(including orthotics)   What is the patient wearing?: Hospital gown only;Pull over shirt   Assist Level: Set up assist Assistive Device Comment: seated  Lower Body Dressing (excluding footwear)   What is the patient wearing?: Pants Assist for lower body dressing: Contact Guard/Touching assist    Putting on/Taking off footwear   What is the patient wearing?: Non-skid slipper  socks Assist for footwear: Moderate Assistance - Patient 50 - 74%       Care Tool Toileting Toileting activity   Assist for toileting: Contact Guard/Touching assist     Care Tool Bed Mobility Roll left and right activity   Roll left and right assist level: Supervision/Verbal cueing    Sit to lying activity   Sit to lying assist level: Contact Guard/Touching assist    Lying to sitting on side of bed activity   Lying to sitting on side of bed assist level: the ability to move from lying on the back to sitting on the side of the bed with no back support.: Contact Guard/Touching assist     Care Tool Transfers  Sit to stand transfer   Sit to stand assist level: Contact Guard/Touching assist    Chair/bed transfer   Chair/bed transfer assist level: Contact Guard/Touching assist     Toilet transfer   Assist Level: Contact Guard/Touching assist     Care Tool Cognition  Expression of Ideas and Wants Expression of Ideas and Wants: 4. Without difficulty (complex and basic) - expresses complex messages without difficulty and with speech that is clear and easy to understand  Understanding Verbal and Non-Verbal Content Understanding Verbal and Non-Verbal Content: 4. Understands (complex and basic) - clear comprehension without cues or repetitions   Memory/Recall Ability Memory/Recall Ability : Current season;That he or she is in a hospital/hospital unit;Staff names and faces   Refer to Care Plan for [place] 1 OT Short Term Goal 1 (Week 1): STG = LTG 2/ 2 ELOS  Recommendations for other services: None    Skilled Therapeutic Intervention ADL ADL Eating: Independent Where Assessed-Eating: Chair Grooming: Contact guard Where Assessed-Grooming: Standing at sink Upper Body Bathing: Setup Where Assessed-Upper Body Bathing: Sitting at sink Lower Body Bathing: Contact guard Where Assessed-Lower Body Bathing: Standing at sink Upper Body Dressing:  Setup Where Assessed-Upper Body Dressing: Sitting at sink Lower Body Dressing: Contact guard Where Assessed-Lower Body Dressing: Standing at sink Toileting: Contact guard Where Assessed-Toileting: Glass blower/designer: Therapist, music Method: Counselling psychologist: Energy manager: Not assessed Social research officer, government: Not assessed Mobility  Bed Mobility Bed Mobility: Not assessed Rolling Right: Supervision/verbal cueing Rolling Left: Supervision/Verbal cueing Right Sidelying to Sit: Supervision/Verbal cueing Sit to Sidelying Right: Supervision/Verbal cueing Transfers Sit to Stand: Contact Guard/Touching assist Stand to Sit: Contact Guard/Touching assist  Session Note: Pt received seated in recliner finishing breakfast, denies pain, but endorses dizziness 2/2 "moving" vision, agreeable to OT eval.  Reviewed role of CIR OT, evaluation process, ADL/func mobility retraining, goals for therapy, and safety plan. Evaluation completed as documented above. STS and completed short amb transfer > w/c with CGA + RW. Pt elects to not wear eye patch during mobility, but endorses otherwise using it frequently. Completed UBB + UBD seated at sink with set-up A. Completed LBD + standing oral care with CGA, mild posterior lean in standing. Amb transfer > toilet with CGA + RW, CGA for all toileting tasks post cont void of bladder. Elected to completed LBD on toilet while seated with distant S. Amb transfer > recliner same manner as before. Pt relates he is most concerned about his balance and walking prior to DC, he is primary CG for his wife and performs all household management/IADL. She does not need physical assist, however. Pt left seated in recliner with safety belt alarm engaged, call bell in reach, and all immediate needs met.   Session 2 [phone]): Pt received seated in recliner, reports LUE bleeding after having scraped it against the door in previous  PT session, but denies pain, agreeable to therapy. Session focus on self-care retraining, activity tolerance, LUE NMR, BUE/BLE strengthening, cardiovascular endurance, dynamic standing balance in prep for improved ADL/IADL/func mobility performance + decreased caregiver burden. Short amb transfer > w/c with CGA + RW, cues for safe RW use as pt tends to pick it up and place it down vs pushing. Pt self-propelled w/c to and from gym for improved BUE coordination/strengthening + close S for technique. Assessed B grip strength and administered 9HPT with the following results:  R: 90, 88, 92; average of 90 lbs;  35 secs L: 78, 75, 82; average of 78 lbs; 33 secs  Endorses visual deficits impairing ability to see pegs, has "to feel for them" instead.  Standing at [place], completed the following games with CGA to min A for balance 2/2 posterior lean:  Single Target: 64.95% accuracy, 1.87 reaction time Bell Cancellation: 3 min 33 secs; 1 miss Rotating Sequence: 86.21% accuracy, 5.63 reaction  Pt able to utilize LUE during all tasks, endorses having to close one eye to see clearly.    Finally, completed 5 min on nustep at level 8 resistive with average of 58 steps/min. Endorses 5/10 on modified RPE post actiivty.  In room, short amb transfer to toilet with CGA + mod cues for RW safety, CGA for toileting tasks post continent void of bladder. Short amb transfer > recliner same manner as before.   Pt left seated in recliner with safety belt alarm engaged, call bell in reach, and all immediate needs met.    Discharge Criteria: Patient will be discharged from OT if patient refuses treatment 3 consecutive times without medical reason, if treatment goals not met, if there is a change in medical status, if patient makes no progress towards goals or if patient is discharged from hospital.  The above assessment, treatment plan, treatment alternatives and goals were discussed and mutually agreed upon: by  patient  Volanda Napoleon MS, OTR/L  02/19/2021, 10:55 AM

## 2021-02-19 NOTE — Progress Notes (Signed)
Physical Therapy Session Note  Patient Details  Name: John [name] MRN: [id number] Date of Birth: 1950/10/11  Today's Date: 02/19/2021 PT Individual Time: 9892 - 1194, 52 min     Short Term Goals: Week 1:  PT Short Term Goal 1 (Week 1): = LTGs  Skilled Therapeutic Interventions/Progress Updates:  Pt dozing in reliner.  He denied pain. Pt requested going to toilet.  Gait training with RW in congested room to toilet, CGA. Toilet transfer to sit using wall bar and CGA.  Pt continent of bladder.  Hand washing in standing at sink with supervision.  Stand pivot wc> mat to R with RW, CGa.  Patient demonstrates increased fall risk as noted by score of   16/56 on Berg Balance Scale.  (<36= high risk for falls, close to 100%; 37-45 significant >80%; 46-51 moderate >50%; 52-55 lower >25%)  PT discussed with pt need to use RW for now due to high risk of falling due to balance deficits.  neuromuscular re-education via demo, mirror feedback, multimodal cues for R/L lateal leans in sitting, arms crossed over chest.  No LOB but less eccentric control of trunk noted when leaning L.   Wc propulsion using bil UEs for activity tolerance and L attention, x 150' with min cues.  At end of session, pt resting in recliner with seat pad alarm set and needs at hand.       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

## 2021-02-19 NOTE — Evaluation (Signed)
Physical Therapy Assessment and Plan  Patient Details  Name: John [name] MRN: [id number] Date of Birth: Jul 05, 1950  PT Diagnosis: Abnormal posture, Abnormality of gait, Cognitive deficits, Contracture of joint: L knee and ankle,  Edema, and Hemiparesis non-dominant Rehab Potential:   ELOS:     Today's Date: 02/19/2021 PT Individual Time: 0905-1005 PT Individual Time Calculation (min): 60 min  Hospital Problem: Principal Problem:   Embolic stroke [facility])   Past Medical History:  Past Medical History:  Diagnosis Date   Atrial flutter ([place])    s/p cardioversion   Coronary artery disease    Diabetes mellitus    GERD (gastroesophageal reflux disease)    History of nuclear stress test 04/04/2011   lexiscan; mod-large in size fixed inferolateral defect (scar); non-diagnostic for ischemia; low risk scan    Hyperlipidemia    Hypertension    Left foot drop    r/t past disk srugery - uses Kevlar brace   Myocardial infarction ([place])    posterior MI   Shortness of breath    Sleep apnea    on CPAP; 04/28/2007 split-night - AHI during total sleep 44.43/hr and REM 72.56/hr   Past Surgical History:  Past Surgical History:  Procedure Laterality Date   AMPUTATION TOE Left 02/10/2021   Procedure: AMPUTATION  LEFT SECOND TOE;  Surgeon: Edrick Kins, DPM;  Location: [place];  Service: Podiatry;  Laterality: Left;   [place]  2010   6 stents total   CARDIAC CATHETERIZATION  01/2000   percutaneous transluminal coronary balloon angioplasty of mid RCA stenotic lesion   CARDIAC CATHETERIZATION  06/2006   no stenting; ischemic cardiomyopathy, EF 40-45%   CARDIOVERSION N/A 07/28/2016   Procedure: CARDIOVERSION;  Surgeon: Troy Sine, MD;  Location: [place];  Service: Cardiovascular;  Laterality: N/A;   CORONARY ANGIOPLASTY  09/1998   mid-distal RCA balloon dilatation, 4.5 & 5.0 stents    CORONARY ANGIOPLASTY WITH STENT PLACEMENT  03/1994   angioplasty &  stenting (non-DES) of circumflex/prox ramus intermedius   CORONARY ANGIOPLASTY WITH STENT PLACEMENT  10/1994   large iliac [id number] stent to RCA   [place]  12/2002   4.15m stents x2 of RCA   CORONARY ANGIOPLASTY WITH STENT PLACEMENT  01/2005   cutting balloon arthrectomy of distal RCA & Cypher DES 3.5x13; cutting balloon arthrectomy of mid RCA with Cypher DES 3.5x18   CORONARY ANGIOPLASTY WITH STENT PLACEMENT  11/2008   stenting of mid RCA with 4.0x158mdriver, non-DES   CORONARY BALLOON ANGIOPLASTY N/A 02/15/2021   Procedure: CORONARY BALLOON ANGIOPLASTY;  Surgeon: KeTroy SineMD;  [facility];  Service: Cardiovascular;  Laterality: N/A;   ICD IMPLANT N/A 08/20/2020   Procedure: ICD IMPLANT;  Surgeon: CaConstance HawMD;  [facility];  Service: Cardiovascular;  Laterality: N/A;   INTRAVASCULAR PRESSURE WIRE/FFR STUDY N/A 03/02/2020   Procedure: INTRAVASCULAR PRESSURE WIRE/FFR STUDY;  Surgeon: HaLeonie ManMD;  [facility];  Service: Cardiovascular;  Laterality: N/A;   LEFT HEART CATH AND CORONARY ANGIOGRAPHY N/A 03/02/2020   Procedure: LEFT HEART CATH AND CORONARY ANGIOGRAPHY;  Surgeon: HaLeonie ManMD;  [facility];  Service: Cardiovascular;  Laterality: N/A;   LEFT HEART CATH AND CORONARY ANGIOGRAPHY N/A 08/19/2020   Procedure: LEFT HEART CATH AND CORONARY ANGIOGRAPHY;  Surgeon: BeLorretta HarpMD;  [facility];  Service: Cardiovascular;  Laterality: N/A;   LEFT HEART CATH AND CORONARY ANGIOGRAPHY N/A 02/15/2021   Procedure: LEFT HEART CATH AND CORONARY ANGIOGRAPHY;  Surgeon: Troy Sine, MD;  Location: [place] [facility];  Service: Cardiovascular;  Laterality: N/A;   LEFT HEART CATHETERIZATION WITH CORONARY ANGIOGRAM N/A 02/27/2012   Procedure: LEFT HEART CATHETERIZATION WITH CORONARY ANGIOGRAM;  Surgeon: Lorretta Harp, MD;  Location: [facility];  Service:  Cardiovascular;  Laterality: N/A;   TRANSTHORACIC ECHOCARDIOGRAM  07/29/2010   EF 50=55%, mod inf wall hypokinesis & mild post wall hypokinesis; LA mild-mod dilated; mild mitral annular calcif & mild MR; mild TR & elevated RV systolic pressure; AV mildly sclerotic; mild aortic root dilatation    V TACH ABLATION N/A 01/20/2021   Procedure: V TACH ABLATION;  Surgeon: Vickie Epley, MD;  Location: [place] [facility];  Service: Cardiovascular;  Laterality: N/A;    Assessment & Plan Clinical Impression: John [name] is a 70 year old RH-male with history of HTN, T2DM, GERD, lumbar surgery w/L-foot drop, PAF-on Eliquis, VT s/p ICD/recent cardioversion, CAD s/p PTCI, infected left 2nd toe ulcer with oste s/p amputation 01/75, chronic systolic/diastolic History taken from patient and chart review. Discussed with Cards as well- significant improvement in LUE strength. CHF who was admitted on 02/14/2021 with onset of chest pain radiating to Left arm. He had elevated troponins due to NSTEMI and was started on IV heparin/NTG and taken to cath lab on 02/15/21 for cath with PTCA/PCI of instent thrombosis w/occlusion of proximal PL. Near completion of procedure, patient noted to be lethargic with hypoglycemia, BS 63, had left facial droop with left  neglect, left gaze preference and right sided weakness. CTA head/neck patent vasculature without LVO, occlusion or aneurysm as well as soft tissue lesion LUL concerning for malignancy.  He was not felt to be a tPA candidate as had received heparin and Plavix during procedure.   Repeat Echocardiogram showed EF of 40-45% with mildly decreased function and moderately dilated LV cavity. Follow-up MRI brain showed acute infarct in right thalamus, small punctate areas in bilateral frontal lobe and right parietal white matter suggestive of cerebral emboli. Dr. Leonie Man felt that patient had periprocederal embolic stroke and recommends triple therapy of Eliquis, ASA, Plavix for one  month followed by aspirin and Eliquis thereafter.  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  Dr. Raeanne Barry consulted and patient to follow-up with Dr. Valeta Harms.Podiatry has lowed up for evaluation of amp site and recommends follow up with Dr. Amalia Hailey after discharge for suture removal. PT/OT evaluations completed showing poor safety awareness with lack of insight into deficits, balance deficits with lateral lean, difficulty with high-level tasks as well as reports of double vision with dizziness.  CIR was recommended due to functional decline. Patient transferred to CIR on 02/18/2021 .   Patient currently requires mod assist with mobility secondary to muscle weakness and muscle joint tightness, impaired timing and sequencing and unbalanced muscle activation, decreased attention, decreased awareness, and decreased safety awareness, and decreased sitting balance, decreased standing balance, decreased postural control, hemiplegia, and decreased balance strategies.  Prior to hospitalization, patient was modified independent  with mobility and lived with Spouse in a House home.  Home access is 4 STE in the front, home made ramp in the back for the dogs primarily,  but he did go up and down.  R rail. Not ADA compliant.Stairs to enter, Ramped entrance.  Patient will benefit from skilled PT intervention to maximize safe functional mobility, minimize fall risk, and decrease caregiver burden for planned discharge home with 24 hour supervision.  Anticipate patient will benefit from follow up [place] at discharge.  PT - End of Session Activity Tolerance: Tolerates < 10 min activity, no significant change in vital signs Endurance Deficit: Yes Endurance Deficit Description: fatigued after stairs PT  Assessment Rehab Potential (ACUTE/IP ONLY): Good PT Patient demonstrates impairments in the following area(s): Balance;Edema;Safety;Endurance;Motor;Skin Integrity PT Transfers Functional Problem(s): Bed Mobility;Bed to Chair;Car;Furniture PT Locomotion Functional Problem(s): Ambulation;Wheelchair Mobility;Stairs PT Plan PT Intensity: Minimum of 1-2 x/day ,45 to 90 minutes PT Frequency: 5 out of 7 days PT Duration Estimated Length of Stay: 7-10 PT Treatment/Interventions: Ambulation/gait training;Cognitive remediation/compensation;Discharge planning;DME/adaptive equipment instruction;Functional mobility training;Psychosocial support;Splinting/orthotics;Therapeutic Activities;UE/LE Strength taining/ROM;Visual/perceptual remediation/compensation;Balance/vestibular training;Community reintegration;Functional electrical stimulation;Neuromuscular re-education;Patient/family education;Stair training;Therapeutic Exercise;UE/LE Coordination activities;Wheelchair propulsion/positioning PT Transfers Anticipated Outcome(s): modified independent basic; supervision car PT Locomotion Anticipated Outcome(s): modified independent x 50' home setting; supervision 150' controlled env and community; up/down 12 steps supervision PT Recommendation Follow Up Recommendations: Home health PT Patient destination: Home Equipment Recommended: Rolling walker with 5" wheels   PT Evaluation Precautions/Restrictions Precautions Precautions: Fall Precaution Comments: L foot drop at baseline General   Vital SignsTherapy Vitals Temp: 97.7 F (36.5 C) Pulse Rate: 70 Resp: 18 BP: (!) 91/58 Patient Position (if appropriate): Sitting Oxygen Therapy SpO2: 99 % O2 Device: Room Air Pain Pain Assessment Pain Scale: 0-10 Pain Score: 0-No pain Pain Interference Pain Interference Pain Effect on Sleep: 0. Does not apply - I have not had any pain or hurting in the past 5 days Pain Interference with Therapy Activities: 0.  Does not apply - I have not received rehabilitationtherapy in the past 5 days Pain Interference with Day-to-Day Activities: 1. Rarely or not at all Home Living/Prior [place] Available Help at Discharge: Family;Available 24 hours/day (Pt takes care of wife. Daughter and granddaughter currently providing assist for wife) Type of Home: House Home Access: Stairs to enter;Ramped entrance Entrance Stairs-Number of Steps: 4 STE in the front, home made ramp in the back for the dogs primarily, but he did go up and down.  R rail. Not ADA compliant, but pt feels that it is safe. Entrance Stairs-Rails: Left Home Layout: One level Bathroom Shower/Tub: Multimedia programmer: Standard Bathroom Accessibility: Yes Additional Comments: spouse has a wheelchair Prior Function Level of Independence: Independent with homemaking with ambulation  Able to Take Stairs?: Yes Driving: Yes Vocation: Retired Comments: owns a home made walking stick Vision/Perception  Wears glasses for reading.  Diplopia- "ground wiggles".  See OT eval for details Vision - History Ability to See in Adequate Light: 0 Adequate Perception Perception: Within Functional Limits Praxis Praxis: Intact  Cognition A and O x 4   Sensation Sensation Light Touch: Appears Intact Proprioception: Appears Intact (tested L knee; accurate 5/5) Coordination Heel Shin Test: WNLs bil Motor  Motor Motor: Abnormal postural alignment and control Motor - Skilled Clinical Observations: L lean intermittently with sit>< stand   Trunk/Postural Assessment  Cervical Assessment Cervical Assessment: Within Functional Limits Thoracic Assessment Thoracic Assessment: Exceptions to [facility] (rounded shoulders) Lumbar Assessment Lumbar Assessment: Within Functional Limits Postural Control Postural Control: Deficits on evaluation Righting Reactions: delayed ; leans L intermittently  Balance Balance Balance Assessed: Yes Static  Sitting Balance Static Sitting - Balance Support: No upper extremity supported  Static Sitting - Level of Assistance: 5: Stand by assistance Dynamic Sitting Balance Dynamic Sitting - Balance Support: No upper extremity supported Dynamic Sitting - Level of Assistance: 5: Stand by assistance Reach (Patient is able to reach ___ inches to right, left, forward, back): 12 Dynamic Sitting - Balance Activities: Lateral lean/weight shifting Static Standing Balance Static Standing - Balance Support: No upper extremity supported Static Standing - Level of Assistance: 3: Mod assist (LOB backwards upon standing, at times) Dynamic Standing Balance Dynamic Standing - Level of Assistance: 4: Min assist Dynamic Standing - Balance Activities: Lateral lean/weight shifting Extremity Assessment      RLE Assessment RLE Assessment: Within Functional Limits Passive Range of Motion (PROM) Comments: hamstrings tight; heel cords tight- 0 degrees DF LLE Assessment LLE Assessment: Exceptions to [facility] Passive Range of Motion (PROM) Comments: hamstrings tight; heel cords tight -10 degrees DF Active Range of Motion (AROM) Comments: no active DF General Strength Comments: grossly in sitting: 4/5 hip flexors, abductors and adductors; 4+/5 knee extension, knee flexion; ankle NT due to recent 2nd toe amputation  Care Tool Care Tool Bed Mobility Roll left and right activity   Roll left and right assist level: Supervision/Verbal cueing    Sit to lying activity   Sit to lying assist level: Contact Guard/Touching assist    Lying to sitting on side of bed activity   Lying to sitting on side of bed assist level: the ability to move from lying on the back to sitting on the side of the bed with no back support.: Contact Guard/Touching assist     Care Tool Transfers Sit to stand transfer   Sit to stand assist level: Moderate Assistance - Patient 50 - 74%    Chair/bed transfer   Chair/bed transfer assist level: Moderate  Assistance - Patient 50 - 74%     Physiological scientist transfer assist level: Minimal Assistance - Patient > 75%      Care Tool Locomotion Ambulation   Assist level: Moderate Assistance - Patient 50 - 74% Assistive device: No Device Max distance: 150  Walk 10 feet activity   Assist level: Moderate Assistance - Patient - 50 - 74% Assistive device: No Device   Walk 50 feet with 2 turns activity   Assist level: Moderate Assistance - Patient - 50 - 74% Assistive device: No Device  Walk 150 feet activity   Assist level: Moderate Assistance - Patient - 50 - 74% Assistive device: No Device  Walk 10 feet on uneven surfaces activity Walk 10 feet on uneven surfaces activity did not occur: Safety/medical concerns (due to balance deficits)      Stairs   Assist level: Moderate Assistance - Patient - 50 - 74% Stairs assistive device: 2 hand rails Max number of stairs: 12  Walk up/down 1 step activity   Walk up/down 1 step (curb) assist level: Moderate Assistance - Patient - 50 - 74% Walk up/down 1 step or curb assistive device: 2 hand rails    Walk up/down 4 steps activity Walk up/down 4 steps assist level: Moderate Assistance - Patient - 50 - 74% Walk up/down 4 steps assistive device: 2 hand rails  Walk up/down 12 steps activity   Walk up/down 12 steps assist level: Moderate Assistance - Patient - 50 - 74% Walk up/down 12 steps assistive device: 2 hand rails  Pick up small objects from floor   Pick up small object from the floor assist  level: Minimal Assistance - Patient > 75%    Wheelchair   Type of Wheelchair: Manual   Wheelchair assist level: Supervision/Verbal cueing Max wheelchair distance: 150  Wheel 50 feet with 2 turns activity   Assist Level: Supervision/Verbal cueing  Wheel 150 feet activity   Assist Level: Supervision/Verbal cueing    Refer to Care Plan for [place] [facility]    Recommendations for other services: None    Skilled Therapeutic Intervention Self stretching- seated edge of mat, with use of foot stool for L hamstring and heel cord stretch, x 2 minutes.  Initiated education about role of t pre-existing itght HS and HCs which impact his balance since CVA.    Locomotion - wc propulsion x 150' with superivsion.  Pt somewhat inattentive to L as he passed through doorways.  Gait training on level tile x 100' with RW, CGA on straight path, min assist and max cues for turn to sit.      At end of session, pt resting in recliner iwht feet elevated and back reclined.  Seat pad alarm set and needs at hand. Discharge Criteria: Patient will be discharged from PT if patient refuses treatment 3 consecutive times without medical reason, if treatment goals not met, if there is a change in medical status, if patient makes no progress towards goals or if patient is discharged from hospital.  The above assessment, treatment plan, treatment alternatives and goals were discussed and mutually agreed upon: by patient  Shreyas Piatkowski 02/19/2021, 5:06 PM

## 2021-02-19 NOTE — Plan of Care (Signed)
Problem: RH Balance Goal: LTG Patient will maintain dynamic standing with ADLs (OT) Description: LTG:  Patient will maintain dynamic standing balance with assist during activities of daily living (OT)  Flowsheets (Taken 02/19/2021 1031) LTG: Pt will maintain dynamic standing balance during ADLs with: Independent with assistive device   Problem: Sit to Stand Goal: LTG:  Patient will perform sit to stand in prep for activites of daily living with assistance level (OT) Description: LTG:  Patient will perform sit to stand in prep for activites of daily living with assistance level (OT) Flowsheets (Taken 02/19/2021 1031) LTG: PT will perform sit to stand in prep for activites of daily living with assistance level: Independent with assistive device   Problem: RH Grooming Goal: LTG Patient will perform grooming w/assist,cues/equip (OT) Description: LTG: Patient will perform grooming with assist, with/without cues using equipment (OT) Flowsheets (Taken 02/19/2021 1031) LTG: Pt will perform grooming with assistance level of: Independent with assistive device    Problem: RH Bathing Goal: LTG Patient will bathe all body parts with assist levels (OT) Description: LTG: Patient will bathe all body parts with assist levels (OT) Flowsheets (Taken 02/19/2021 1031) LTG: Pt will perform bathing with assistance level/cueing: Independent with assistive device    Problem: RH Dressing Goal: LTG Patient will perform upper body dressing (OT) Description: LTG Patient will perform upper body dressing with assist, with/without cues (OT). Flowsheets (Taken 02/19/2021 1031) LTG: Pt will perform upper body dressing with assistance level of: Independent Goal: LTG Patient will perform lower body dressing w/assist (OT) Description: LTG: Patient will perform lower body dressing with assist, with/without cues in positioning using equipment (OT) Flowsheets (Taken 02/19/2021 1031) LTG: Pt will perform lower body dressing with  assistance level of: Independent with assistive device   Problem: RH Toileting Goal: LTG Patient will perform toileting task (3/3 steps) with assistance level (OT) Description: LTG: Patient will perform toileting task (3/3 steps) with assistance level (OT)  Flowsheets (Taken 02/19/2021 1031) LTG: Pt will perform toileting task (3/3 steps) with assistance level: Independent with assistive device   Problem: RH Vision Goal: RH LTG Vision Theme park manager) Flowsheets (Taken 02/19/2021 1031) LTG: Vision Goals: Pt will implement compensatory strategies (use of eye patch, visual scanning, etc.) with mod I during ADL to compensate for impaired vision.   Problem: RH Functional Use of Upper Extremity Goal: LTG Patient will use RT/LT upper extremity as a (OT) Description: LTG: Patient will use right/left upper extremity as a stabilizer/gross assist/diminished/nondominant/dominant level with assist, with/without cues during functional activity (OT) Flowsheets (Taken 02/19/2021 1031) LTG: Use of upper extremity in functional activities: LUE as nondominant level LTG: Pt will use upper extremity in functional activity with assistance level of: Independent with assistive device   Problem: RH Simple Meal Prep Goal: LTG Patient will perform simple meal prep w/assist (OT) Description: LTG: Patient will perform simple meal prep with assistance, with/without cues (OT). Flowsheets (Taken 02/19/2021 1031) LTG: Pt will perform simple meal prep with assistance level of: Independent with assistive device   Problem: RH Light Housekeeping Goal: LTG Patient will perform light housekeeping w/assist (OT) Description: LTG: Patient will perform light housekeeping with assistance, with/without cues (OT). Flowsheets (Taken 02/19/2021 1031) LTG: Pt will perform light housekeeping with assistance level of: Independent with assistive device   Problem: RH Toilet Transfers Goal: LTG Patient will perform toilet transfers w/assist  (OT) Description: LTG: Patient will perform toilet transfers with assist, with/without cues using equipment (OT) Flowsheets (Taken 02/19/2021 1031) LTG: Pt will perform toilet transfers with assistance  level of: Independent with assistive device   Problem: RH Tub/Shower Transfers Goal: LTG Patient will perform tub/shower transfers w/assist (OT) Description: LTG: Patient will perform tub/shower transfers with assist, with/without cues using equipment (OT) Flowsheets (Taken 02/19/2021 1031) LTG: Pt will perform tub/shower stall transfers with assistance level of: Independent with assistive device   Problem: RH Awareness Goal: LTG: Patient will demonstrate awareness during functional activites type of (OT) Description: LTG: Patient will demonstrate awareness during functional activites type of (OT) Flowsheets (Taken 02/19/2021 1031) Patient will demonstrate awareness during functional activites type of: Anticipatory LTG: Patient will demonstrate awareness during functional activites type of (OT): Modified Independent

## 2021-02-19 NOTE — Progress Notes (Signed)
PROGRESS NOTE   Subjective/Complaints: C/o rash on chest Received skin tear to left arm today- still bleeding- asked John [name] to place 4x4 with ace bandage. Advised patient and John [name] that if bleeding persists will hold Plavix tomorrow  ROS: + bleeding skin tear left arm   Objective:   No results found. Recent Labs    02/19/21 0507  WBC 6.1  HGB 16.3  HCT 50.1  PLT 210   Recent Labs    02/19/21 0507  NA 142  K 3.8  CL 110  CO2 25  GLUCOSE 126*  BUN 14  CREATININE 1.01  CALCIUM 8.7*    Intake/Output Summary (Last 24 hours) at 02/19/2021 1420 Last data filed at 02/19/2021 1323 Gross per 24 hour  Intake 500 ml  Output 700 ml  Net -200 ml        Physical Exam: Vital Signs Blood pressure (!) 91/58, pulse 70, temperature 97.7 F (36.5 C), resp. rate 18, height 6' (1.829 m), weight 94.5 kg, SpO2 99 %. Gen: no distress, normal appearing HEENT: oral mucosa pink and moist, NCAT Cardiovascular:     Comments: Irregularly irregular Pulmonary:     Effort: Pulmonary effort is normal.     Breath sounds: Normal breath sounds.  Abdominal:     General: There is no distension.     Palpations: Abdomen is soft.  Musculoskeletal:     Cervical back: Normal range of motion and neck supple.     Comments: No edema or tenderness in extremities  Skin:    General: Skin is warm and dry.  Neurological:     Mental Status: He is alert and oriented to person, place, and time.     Comments: Alert Mild dysphonia with occasional hoarseness and mild left facial weakness.  He was able to follow commands without difficulty.  Dysconjugate gaze and left eye slow to initiate movement.  Motor: Right upper extremity: 5/5 proximal distal Right lower extremity: 5/5 proximal distal Left upper extremity: 4+/5 proximal distally left lower extremity: Flexion, knee extension 1+/5, ankle dorsiflexion 0/5 Sensation intact light touch  Psychiatric:         Mood and Affect: Mood normal.        Behavior: Behavior normal.    Assessment/Plan: 1. Functional deficits which require 3+ hours per day of interdisciplinary therapy in a comprehensive inpatient rehab setting. Physiatrist is providing close team supervision and 24 hour management of active medical problems listed below. Physiatrist and rehab team continue to assess barriers to discharge/monitor patient progress toward functional and medical goals  Care Tool:  Bathing    Body parts bathed by patient: Face, Right arm, Left arm, Front perineal area, Abdomen, Chest, Buttocks, Right upper leg, Left upper leg, Right lower leg, Left lower leg         Bathing assist Assist Level: Contact Guard/Touching assist     Upper Body Dressing/Undressing Upper body dressing   What is the patient wearing?: Hospital gown only, Pull over shirt    Upper body assist Assist Level: Set up assist Assistive Device Comment: seated  Lower Body Dressing/Undressing Lower body dressing      What is the patient wearing?: Pants  Lower body assist Assist for lower body dressing: Contact Guard/Touching assist     Toileting Toileting    Toileting assist Assist for toileting: Contact Guard/Touching assist     Transfers Chair/bed transfer  Transfers assist     Chair/bed transfer assist level: Contact Guard/Touching assist     Locomotion Ambulation   Ambulation assist              Walk 10 feet activity   Assist           Walk 50 feet activity   Assist           Walk 150 feet activity   Assist           Walk 10 feet on uneven surface  activity   Assist           Wheelchair     Assist               Wheelchair 50 feet with 2 turns activity    Assist            Wheelchair 150 feet activity     Assist          Blood pressure (!) 91/58, pulse 70, temperature 97.7 F (36.5 C), resp. rate 18, height 6' (1.829 m), weight  94.5 kg, SpO2 99 %.  Medical Problem List and Plan: 1.  poor safety awareness with lack of insight into deficits, balance deficits with lateral lean, difficulty with high-level tasks as well as reports of double vision with dizziness secondary to right thalamic infarct, bilateral punctate frontal lobe infarcts - embolic.             -patient may not shower             -ELOS/Goals: 8-12 days/Supervision/mod I             Admit to CIR 2.  Antithrombotics: -DVT/anticoagulation:  Pharmaceutical: Other (comment)--Eliquis indefinitely              -antiplatelet therapy: ASA/Plavix. -->Dr. Ellyn [name] recommends triple drug therapy with discontinuation of ASA after a month? Plavix for minimum 6-12 months.  3. Pain Management:  4. Mood: LCSW to follow for evaluation  and support.              -antipsychotic agents: N/a 5. Neuropsych: This patient is capable of making decisions on his own behalf. 6. Skin/Wound Care: Routine pressure relief measures 7. Fluids/Electrolytes/Nutrition: Monitor I/Os.             CMP ordered for tomorrow AM 8.  CAD/NSTEMI s/John [name] PTCA: ON ASA, Plavix, Crestor, metoprolol              --monitor for symptoms with increase in activity.  9. H/o PAF/VT s/John [name] ICD/ablation 8/22: Monitor HR TID and for any symptoms             --On Betapace, [name] and Metoprolol 10. T2DM: Hgb A1C-7.6.  On Jardiance and Levemir with SSI for tighter control Monitor BS ac/hs.  Monitor with increased mobility  11. HTN: Monitor BP tid --continue Enstresto, metoprolol and Betapace              Monitor with increased mobility 12. Chronic combined CHF:  EF 40-45% --evolemic. Monitor for signs of overload.  Daily weights             --On Ferne Coe and Statin.  13. Osteomyelitis s/John [name] L-2nd toe amp: ON Keflex for group B strep thorough 09/02.  14. COPD/Enlargening  LUL mass: Continue Singulair, Budesonide nebs BID and albuterol prn SOB             --Has appt with Dr. Valeta [name] on 03/30/21 at 3:30 pm.   15. Hypotension: decrease Toprol to 87.5mg .  16. Rash on chest: start hydrocortisone cream 1% BID 17. Overweight BMI 28.26: provide dietary education  LOS: 1 days A FACE TO FACE EVALUATION WAS PERFORMED  John [name] John [name] John [name] 02/19/2021, 2:20 PM

## 2021-02-20 LAB — GLUCOSE, CAPILLARY
Glucose-Capillary: 117 mg/dL — ABNORMAL HIGH (ref 70–99)
Glucose-Capillary: 165 mg/dL — ABNORMAL HIGH (ref 70–99)
Glucose-Capillary: 174 mg/dL — ABNORMAL HIGH (ref 70–99)
Glucose-Capillary: 208 mg/dL — ABNORMAL HIGH (ref 70–99)

## 2021-02-20 NOTE — Progress Notes (Signed)
PROGRESS NOTE   Subjective/Complaints: No new complaints Rash on chest improved Left arm nicely bandaged  ROS: + bleeding skin tear left arm, +impaired vision   Objective:   No results found. Recent Labs    02/19/21 0507  WBC 6.1  HGB 16.3  HCT 50.1  PLT 210   Recent Labs    02/19/21 0507  NA 142  K 3.8  CL 110  CO2 25  GLUCOSE 126*  BUN 14  CREATININE 1.01  CALCIUM 8.7*    Intake/Output Summary (Last 24 hours) at 02/20/2021 1146 Last data filed at 02/20/2021 0700 Gross per 24 hour  Intake 720 ml  Output 700 ml  Net 20 ml        Physical Exam: Vital Signs Blood pressure 116/82, pulse 70, temperature 97.6 F (36.4 C), resp. rate 18, height 6' (1.829 m), weight 94.5 kg, SpO2 95 %. Gen: no distress, normal appearing HEENT: oral mucosa pink and moist, NCAT Cardiovascular:     Comments: Irregularly irregular Pulmonary:     Effort: Pulmonary effort is normal.     Breath sounds: Normal breath sounds.  Abdominal:     General: There is no distension.     Palpations: Abdomen is soft.  Musculoskeletal:     Cervical back: Normal range of motion and neck supple.     Comments: No edema or tenderness in extremities  Skin:    General: Skin is warm and dry.  Neurological:     Mental Status: He is alert and oriented to person, place, and time.     Comments: Alert Mild dysphonia with occasional hoarseness and mild left facial weakness.  He was able to follow commands without difficulty.  Dysconjugate gaze and left eye slow to initiate movement.  Motor: Right upper extremity: 5/5 proximal distal Right lower extremity: 5/5 proximal distal Left upper extremity: 4+/5 proximal distally left lower extremity: Flexion, knee extension 1+/5, ankle dorsiflexion 0/5 Sensation intact light touch  Psychiatric:        Mood and Affect: Mood normal.        Behavior: Behavior normal. GU: continent    Assessment/Plan: 1.  Functional deficits which require 3+ hours per day of interdisciplinary therapy in a comprehensive inpatient rehab setting. Physiatrist is providing close team supervision and 24 hour management of active medical problems listed below. Physiatrist and rehab team continue to assess barriers to discharge/monitor patient progress toward functional and medical goals  Care Tool:  Bathing    Body parts bathed by patient: Face, Right arm, Left arm, Front perineal area, Abdomen, Chest, Buttocks, Right upper leg, Left upper leg, Right lower leg, Left lower leg         Bathing assist Assist Level: Contact Guard/Touching assist     Upper Body Dressing/Undressing Upper body dressing   What is the patient wearing?: Hospital gown only, Pull over shirt    Upper body assist Assist Level: Set up assist Assistive Device Comment: seated  Lower Body Dressing/Undressing Lower body dressing      What is the patient wearing?: Pants     Lower body assist Assist for lower body dressing: Contact Guard/Touching assist     Toileting Toileting  Toileting assist Assist for toileting: Minimal Assistance - Patient > 75%     Transfers Chair/bed transfer  Transfers assist     Chair/bed transfer assist level: Moderate Assistance - Patient 50 - 74%     Locomotion Ambulation   Ambulation assist      Assist level: Moderate Assistance - Patient 50 - 74% Assistive device: No Device Max distance: 150   Walk 10 feet activity   Assist     Assist level: Moderate Assistance - Patient - 50 - 74% Assistive device: No Device   Walk 50 feet activity   Assist    Assist level: Moderate Assistance - Patient - 50 - 74% Assistive device: No Device    Walk 150 feet activity   Assist    Assist level: Moderate Assistance - Patient - 50 - 74% Assistive device: No Device    Walk 10 feet on uneven surface  activity   Assist Walk 10 feet on uneven surfaces activity did not occur:  Safety/medical concerns (due to balance deficits)         Wheelchair     Assist   Type of Wheelchair: Manual    Wheelchair assist level: Supervision/Verbal cueing Max wheelchair distance: 150    Wheelchair 50 feet with 2 turns activity    Assist        Assist Level: Supervision/Verbal cueing   Wheelchair 150 feet activity     Assist      Assist Level: Supervision/Verbal cueing   Blood pressure 116/82, pulse 70, temperature 97.6 F (36.4 C), resp. rate 18, height 6' (1.829 m), weight 94.5 kg, SpO2 95 %.  Medical Problem List and Plan: 1.  poor safety awareness with lack of insight into deficits, balance deficits with lateral lean, difficulty with high-level tasks as well as reports of double vision with dizziness secondary to right thalamic infarct, bilateral punctate frontal lobe infarcts - embolic.             -patient may not shower             -ELOS/Goals: 8-12 days/Supervision/mod I            Continue CIR 2.  Antithrombotics: -DVT/anticoagulation:  Pharmaceutical: Other (comment)--Eliquis indefinitely              -antiplatelet therapy: ASA/Plavix. -->Dr. Ellyn Hack recommends triple drug therapy with discontinuation of ASA after a month? Plavix for minimum 6-12 months.  3. Pain Management: N/A 4. Mood: LCSW to follow for evaluation  and support.              -antipsychotic agents: N/a 5. Neuropsych: This patient is capable of making decisions on his own behalf. 6. Skin/Wound Care: Routine pressure relief measures 7. Fluids/Electrolytes/Nutrition: Monitor I/Os.             CMP ordered for tomorrow AM 8.  CAD/NSTEMI s/p PTCA: ON ASA, Plavix, Crestor, metoprolol              --monitor for symptoms with increase in activity.  9. H/o PAF/VT s/p ICD/ablation 8/22: Monitor HR TID and for any symptoms             --On Betapace, [name] and Metoprolol 10. T2DM: Hgb A1C-7.6.  On Jardiance and Levemir with SSI for tighter control Monitor BS ac/hs.  Monitor  with increased mobility  11. HTN: Monitor BP tid --continue Enstresto, metoprolol and Betapace              Monitor with increased mobility 12.  Chronic combined CHF:  EF 40-45% --evolemic. Monitor for signs of overload.  Daily weights             --Continue Entresto, Jardiance and Statin.  13. Osteomyelitis s/p L-2nd toe amp: ON Keflex for group B strep thorough 09/02.  14. COPD/Enlargening LUL mass: Continue Singulair, Budesonide nebs BID and albuterol prn SOB             --Has appt with Dr. Valeta Harms on 03/30/21 at 3:30 pm.  15. Hypotension: continue Toprol to 87.5mg .  16. Rash on chest: continue hydrocortisone cream 1% BID- improved 17. Overweight BMI 28.26: provide dietary education  LOS: 2 days A FACE TO FACE EVALUATION WAS PERFORMED  Martha Clan P Blain Hunsucker 02/20/2021, 11:46 AM

## 2021-02-21 DIAGNOSIS — I63139 Cerebral infarction due to embolism of unspecified carotid artery: Secondary | ICD-10-CM

## 2021-02-21 LAB — BASIC METABOLIC PANEL
Anion gap: 5 (ref 5–15)
BUN: 13 mg/dL (ref 8–23)
CO2: 29 mmol/L (ref 22–32)
Calcium: 8.7 mg/dL — ABNORMAL LOW (ref 8.9–10.3)
Chloride: 110 mmol/L (ref 98–111)
Creatinine, Ser: 1.08 mg/dL (ref 0.61–1.24)
GFR, Estimated: >60 mL/min (ref >60)
Glucose, Bld: 139 mg/dL — ABNORMAL HIGH (ref 70–99)
Potassium: 3.5 mmol/L (ref 3.5–5.1)
Sodium: 144 mmol/L (ref 135–145)

## 2021-02-21 LAB — CBC
HCT: 50.9 % (ref 39.0–52.0)
Hemoglobin: 16.2 g/dL (ref 13.0–17.0)
MCH: 29.8 pg (ref 26.0–34.0)
MCHC: 31.8 g/dL (ref 30.0–36.0)
MCV: 93.6 fL (ref 80.0–100.0)
Platelets: 250 10*3/uL (ref 150–400)
RBC: 5.44 MIL/uL (ref 4.22–5.81)
RDW: 13.7 % (ref 11.5–15.5)
WBC: 7.4 10*3/uL (ref 4.0–10.5)
nRBC: 0 % (ref 0.0–0.2)

## 2021-02-21 LAB — GLUCOSE, CAPILLARY
Glucose-Capillary: 151 mg/dL — ABNORMAL HIGH (ref 70–99)
Glucose-Capillary: 136 mg/dL — ABNORMAL HIGH (ref 70–99)
Glucose-Capillary: 124 mg/dL — ABNORMAL HIGH (ref 70–99)
Glucose-Capillary: 109 mg/dL — ABNORMAL HIGH (ref 70–99)

## 2021-02-21 NOTE — Progress Notes (Signed)
PROGRESS NOTE   Subjective/Complaints:  Double vision (actually more blurriness ) when looking to left but overall improved   ROS: + bleeding skin tear left arm, +impaired vision   Objective:   No results found. Recent Labs    02/19/21 0507 02/21/21 0547  WBC 6.1 7.4  HGB 16.3 16.2  HCT 50.1 50.9  PLT 210 250    Recent Labs    02/19/21 0507 02/21/21 0547  NA 142 144  K 3.8 3.5  CL 110 110  CO2 25 29  GLUCOSE 126* 139*  BUN 14 13  CREATININE 1.01 1.08  CALCIUM 8.7* 8.7*     Intake/Output Summary (Last 24 hours) at 02/21/2021 1005 Last data filed at 02/21/2021 0700 Gross per 24 hour  Intake 760 ml  Output 1500 ml  Net -740 ml         Physical Exam: Vital Signs Blood pressure 121/86, pulse 70, temperature 98.1 F (36.7 C), resp. rate 16, height 6' (1.829 m), weight 94.5 kg, SpO2 96 %.  General: No acute distress Mood and affect are appropriate Heart: Regular rate and rhythm no rubs murmurs or extra sounds Lungs: Clear to auscultation, breathing unlabored, no rales or wheezes Abdomen: Positive bowel sounds, soft nontender to palpation, nondistended Extremities: No clubbing, cyanosis, or edema Skin: No evidence of breakdown, no evidence of rash   Neurological:     Mental Status: He is alert and oriented to person, place, and time.     Comments: Alert Mild dysphonia with occasional hoarseness and mild left facial weakness.  He was able to follow commands without difficulty.  Dysconjugate gaze and left eye slow to initiate movement.  Motor: Right upper extremity: 5/5 proximal distal Right lower extremity: 5/5 proximal distal Left upper extremity: 4+/5 proximal distally left lower extremity: Flexion, knee extension 1+/5, ankle dorsiflexion 0/5 Sensation intact light touch  Psychiatric:        Mood and Affect: Mood normal.        Behavior: Behavior normal. GU: continent    Assessment/Plan: 1.  Functional deficits which require 3+ hours per day of interdisciplinary therapy in a comprehensive inpatient rehab setting. Physiatrist is providing close team supervision and 24 hour management of active medical problems listed below. Physiatrist and rehab team continue to assess barriers to discharge/monitor patient progress toward functional and medical goals  Care Tool:  Bathing    Body parts bathed by patient: Face, Right arm, Left arm, Front perineal area, Abdomen, Chest, Buttocks, Right upper leg, Left upper leg, Right lower leg, Left lower leg         Bathing assist Assist Level: Contact Guard/Touching assist     Upper Body Dressing/Undressing Upper body dressing   What is the patient wearing?: Pull over shirt    Upper body assist Assist Level: Independent Assistive Device Comment: seated  Lower Body Dressing/Undressing Lower body dressing      What is the patient wearing?: Underwear/pull up, Pants     Lower body assist Assist for lower body dressing: Independent     Toileting Toileting    Toileting assist Assist for toileting: Minimal Assistance - Patient > 75%     Transfers Chair/bed transfer  Transfers assist     Chair/bed transfer assist level: Minimal Assistance - Patient > 75%     Locomotion Ambulation   Ambulation assist      Assist level: Moderate Assistance - Patient 50 - 74% Assistive device: No Device Max distance: 150   Walk 10 feet activity   Assist     Assist level: Moderate Assistance - Patient - 50 - 74% Assistive device: No Device   Walk 50 feet activity   Assist    Assist level: Moderate Assistance - Patient - 50 - 74% Assistive device: No Device    Walk 150 feet activity   Assist    Assist level: Moderate Assistance - Patient - 50 - 74% Assistive device: No Device    Walk 10 feet on uneven surface  activity   Assist Walk 10 feet on uneven surfaces activity did not occur: Safety/medical concerns (due  to balance deficits)         Wheelchair     Assist   Type of Wheelchair: Manual    Wheelchair assist level: Supervision/Verbal cueing Max wheelchair distance: 150    Wheelchair 50 feet with 2 turns activity    Assist        Assist Level: Supervision/Verbal cueing   Wheelchair 150 feet activity     Assist      Assist Level: Supervision/Verbal cueing   Blood pressure 121/86, pulse 70, temperature 98.1 F (36.7 C), resp. rate 16, height 6' (1.829 m), weight 94.5 kg, SpO2 96 %.  Medical Problem List and Plan: 1.  poor safety awareness with lack of insight into deficits, balance deficits with lateral lean, difficulty with high-level tasks as well as reports of double vision with dizziness secondary to right thalamic infarct, bilateral punctate frontal lobe infarcts - embolic. Given extra ocular symptoms suspect midbrain involvement but none seen on MRI              -patient may not shower             -ELOS/Goals: 8-12 days/Supervision/mod I            Continue CIR 2.  Antithrombotics: -DVT/anticoagulation:  Pharmaceutical: Other (comment)--Eliquis indefinitely              -antiplatelet therapy: ASA/Plavix. -->Dr. Ellyn Hack recommends triple drug therapy with discontinuation of ASA after a month? Plavix for minimum 6-12 months.  3. Pain Management: N/A 4. Mood: LCSW to follow for evaluation  and support.              -antipsychotic agents: N/a 5. Neuropsych: This patient is capable of making decisions on his own behalf. 6. Skin/Wound Care: Routine pressure relief measures 7. Fluids/Electrolytes/Nutrition: Monitor I/Os.             CMP ordered for tomorrow AM 8.  CAD/NSTEMI s/p PTCA: ON ASA, Plavix, Crestor, metoprolol              --monitor for symptoms with increase in activity.  9. H/o PAF/VT s/p ICD/ablation 8/22: Monitor HR TID and for any symptoms             --On Betapace, [name] and Metoprolol 10. T2DM: Hgb A1C-7.6.  On Jardiance and Levemir with SSI  for tighter control Monitor BS ac/hs.  CBG (last 3)  Recent Labs    02/20/21 1618 02/20/21 2137 02/21/21 0553  GLUCAP 174* 208* 151*    11. HTN: Monitor BP tid --continue Enstresto, metoprolol and Betapace  Monitor with increased mobility Vitals:   02/21/21 0447 02/21/21 0744  BP: 121/86   Pulse: 70   Resp: 16   Temp: 98.1 F (36.7 C)   SpO2: 94% 96%   Controlled 9/5 12. Chronic combined CHF:  EF 40-45% --evolemic. Monitor for signs of overload.  Daily weights             --Continue Entresto, Jardiance and Statin.  13. Osteomyelitis s/p L-2nd toe amp: ON Keflex for group B strep thorough 09/02.  14. COPD/Enlargening LUL mass: Continue Singulair, Budesonide nebs BID and albuterol prn SOB             --Has appt with Dr. Valeta Harms on 03/30/21 at 3:30 pm.  15. Hypotension: continue Toprol to 87.5mg .  16. Rash on chest: continue hydrocortisone cream 1% BID- improved 17. Overweight BMI 28.26: provide dietary education  LOS: 3 days A FACE TO FACE EVALUATION WAS PERFORMED  Charlett Blake 02/21/2021, 10:05 AM

## 2021-02-21 NOTE — Progress Notes (Signed)
Occupational Therapy Session Note  Patient Details  Name: John [name] MRN: [id number] Date of Birth: 12/26/1950  Today's Date: 02/21/2021 OT Individual Time: 1101-1200 OT Individual Time Calculation (min): 59 min    Short Term Goals: Week 1:  OT Short Term Goal 1 (Week 1): STG = LTG 2/ 2 ELOS   Skilled Therapeutic Interventions/Progress Updates:    Pt greeted at time of session sitting up in recliner agreeable to OT session and no pain reported throughout. Discussion at beginning of session to build rapport and discuss visual deficits. Pt ambulating room > gym > ortho gym > back to room CGA overall with RW and only occasional mild lean to the L but no LOB. In gym, measured grip strength for the following: R hand 85# and L hand 75# no significant difference as pt is R handed. Focused on core strength for 1x15 sit ups on large wedge with 2kg ball press, standing rebounder tosses for standing balance 2x15 throws with overhead press. Standing at [facility], pt performing 5 rounds of various visual scanning activities alternating use of RUE and LUE for hitting targets, following in a sequence, and cancellation tasks only missing 3 bells. Pt ambulating back to room same as above and alarm on call bell in reach up in recliner.   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

## 2021-02-21 NOTE — Progress Notes (Signed)
Inpatient Rehabilitation Care Coordinator Assessment and Plan Patient Details  Name: John [name] MRN: [id number] Date of Birth: [birth year]  Today's Date: 02/21/2021  Hospital Problems: Principal Problem:   Embolic stroke [facility])  Past Medical History:  Past Medical History:  Diagnosis Date   Atrial flutter ([place])    s/p cardioversion   Coronary artery disease    Diabetes mellitus    GERD (gastroesophageal reflux disease)    History of nuclear stress test 04/04/2011   lexiscan; mod-large in size fixed inferolateral defect (scar); non-diagnostic for ischemia; low risk scan    Hyperlipidemia    Hypertension    Left foot drop    r/t past disk srugery - uses Kevlar brace   Myocardial infarction ([place])    posterior MI   Shortness of breath    Sleep apnea    on CPAP; 04/28/2007 split-night - AHI during total sleep 44.43/hr and REM 72.56/hr   Past Surgical History:  Past Surgical History:  Procedure Laterality Date   AMPUTATION TOE Left 02/10/2021   Procedure: AMPUTATION  LEFT SECOND TOE;  Surgeon: Edrick Kins, DPM;  Location: [place];  Service: Podiatry;  Laterality: Left;   [place]  2010   6 stents total   CARDIAC CATHETERIZATION  01/2000   percutaneous transluminal coronary balloon angioplasty of mid RCA stenotic lesion   CARDIAC CATHETERIZATION  06/2006   no stenting; ischemic cardiomyopathy, EF 40-45%   CARDIOVERSION N/A 07/28/2016   Procedure: CARDIOVERSION;  Surgeon: Troy Sine, MD;  Location: [place];  Service: Cardiovascular;  Laterality: N/A;   CORONARY ANGIOPLASTY  09/1998   mid-distal RCA balloon dilatation, 4.5 & 5.0 stents    CORONARY ANGIOPLASTY WITH STENT PLACEMENT  03/1994   angioplasty & stenting (non-DES) of circumflex/prox ramus intermedius   CORONARY ANGIOPLASTY WITH STENT PLACEMENT  10/1994   large iliac [id number] stent to RCA   [place]  12/2002   4.91m stents x2 of RCA   CORONARY  ANGIOPLASTY WITH STENT PLACEMENT  01/2005   cutting balloon arthrectomy of distal RCA & Cypher DES 3.5x13; cutting balloon arthrectomy of mid RCA with Cypher DES 3.5x18   CORONARY ANGIOPLASTY WITH STENT PLACEMENT  11/2008   stenting of mid RCA with 4.0x179mdriver, non-DES   CORONARY BALLOON ANGIOPLASTY N/A 02/15/2021   Procedure: CORONARY BALLOON ANGIOPLASTY;  Surgeon: KeTroy SineMD;  [facility];  Service: Cardiovascular;  Laterality: N/A;   ICD IMPLANT N/A 08/20/2020   Procedure: ICD IMPLANT;  Surgeon: CaConstance HawMD;  [facility];  Service: Cardiovascular;  Laterality: N/A;   INTRAVASCULAR PRESSURE WIRE/FFR STUDY N/A 03/02/2020   Procedure: INTRAVASCULAR PRESSURE WIRE/FFR STUDY;  Surgeon: HaLeonie ManMD;  [facility];  Service: Cardiovascular;  Laterality: N/A;   LEFT HEART CATH AND CORONARY ANGIOGRAPHY N/A 03/02/2020   Procedure: LEFT HEART CATH AND CORONARY ANGIOGRAPHY;  Surgeon: HaLeonie ManMD;  [facility];  Service: Cardiovascular;  Laterality: N/A;   LEFT HEART CATH AND CORONARY ANGIOGRAPHY N/A 08/19/2020   Procedure: LEFT HEART CATH AND CORONARY ANGIOGRAPHY;  Surgeon: BeLorretta HarpMD;  [facility];  Service: Cardiovascular;  Laterality: N/A;   LEFT HEART CATH AND CORONARY ANGIOGRAPHY N/A 02/15/2021   Procedure: LEFT HEART CATH AND CORONARY ANGIOGRAPHY;  Surgeon: KeTroy SineMD;  [place] [facility];  Service: Cardiovascular;  Laterality: N/A;  LEFT HEART CATHETERIZATION WITH CORONARY ANGIOGRAM N/A 02/27/2012   Procedure: LEFT HEART CATHETERIZATION WITH CORONARY ANGIOGRAM;  Surgeon: Lorretta Harp, MD;  Location: [facility] CATH LAB;  Service: Cardiovascular;  Laterality: N/A;   TRANSTHORACIC ECHOCARDIOGRAM  07/29/2010   EF 50=55%, mod inf wall hypokinesis & mild post wall hypokinesis; LA mild-mod dilated; mild mitral annular calcif & mild MR; mild TR & elevated RV systolic  pressure; AV mildly sclerotic; mild aortic root dilatation    V TACH ABLATION N/A 01/20/2021   Procedure: V TACH ABLATION;  Surgeon: Vickie Epley, MD;  Location: [place] CV LAB;  Service: Cardiovascular;  Laterality: N/A;   Social History:  reports that he quit smoking about 4 years ago. His smoking use included cigarettes. He has a 50.00 pack-year smoking history. He has never used smokeless tobacco. He reports that he does not currently use alcohol. He reports that he does not use drugs.  Family / Support Systems Marital Status: Married How Long?: 28 years Patient Roles: Spouse, Parent Spouse/Significant Other: [name] (wife): Children: Blended family. Pt has a son Dominica Severin from previous marriage; lives 2 mi from their home. Wife has two children from previous marriage: Connie-lives in [place] and [place]- lives in [place]. Other Supports: none reported Anticipated Caregiver: Wife and dtr Marlowe Kays Ability/Limitations of Caregiver: Wife has back issues. 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 Education: high school grad Health Literacy - How often do you need to have someone help you when you read instructions, pamphlets, or other written material from your doctor or pharmacy?: Never Writes: Yes Employment Status: Retired Date Retired/Disabled/Unemployed: 2014 Age Retired: 64 Public relations account executive Issues: Denies Guardian/Conservator: NA   Abuse/Neglect Abuse/Neglect Assessment Can Be Completed: Yes Physical Abuse: Denies Verbal Abuse: Denies Sexual Abuse: Denies Exploitation of patient/patient's resources: Denies Self-Neglect: Denies  Patient response to: Social Isolation - How often do you feel lonely or isolated from those around you?:  Never  Emotional Status Pt's affect, behavior and adjustment status: Pt in good spirits at time of visit Recent Psychosocial Issues: Denies Psychiatric History: Denies Substance Abuse History: Pt admits that he quit cigarettes 4 yrs ago, states he just had enough and put them down. Denies eoth, rec use.  Patient / Family Perceptions, Expectations & Goals Pt/Family understanding of illness & functional limitations: Pt and family have a general understandinf of care needs Premorbid pt/family roles/activities: Independent Anticipated changes in roles/activities/participation: Assistance with ADLs/IADLs Pt/family expectations/goals: Pt goal is "getting my eye sight to function normally."  [employer]: None Premorbid Home Care/DME Agencies: None Transportation available at discharge: wife or dtr Is the patient able to respond to transportation needs?: Yes In the past 12 months, has lack of transportation kept you from medical appointments or from getting medications?: No In the past 12 months, has lack of transportation kept you from meetings, work, or from getting things needed for daily living?: No Resource referrals recommended: Neuropsychology  Discharge Planning Living Arrangements: Spouse/significant other Support Systems: Spouse/significant other, Children Type of Residence: Private residence Insurance Resources: [employer], Multimedia programmer (specify) (BCBS supplement; RXRisk manager) Financial Resources: Fish farm manager, Other (Comment) (retirement) Financial Screen Referred: No Living Expenses: Medical laboratory scientific officer Management: Patient Does the patient have any problems obtaining your medications?: No Home Management: Pt reports he manages all homecare needs  Patient/Family Preliminary Plans: TBD  Clinical Impression This SW covering for primary, SW [employer].  SW met with pt in room to introduce self, explain role, and discuss discharge process.  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 No VA benefits. No HCPOA. No DME. Reports that he is not supposed to drive himself to appointments, but does no occasion. Mainly his dtr or DIL will take him to all appointments. Pt aware primary SW will follow-up.   Yesha Muchow A Caia Lofaro 02/21/2021, 12:02 PM

## 2021-02-21 NOTE — Plan of Care (Signed)
  Problem: Consults Goal: RH STROKE PATIENT EDUCATION Description: See Patient Education module for education specifics  Outcome: Progressing   Problem: RH SKIN INTEGRITY Goal: RH STG SKIN FREE OF INFECTION/BREAKDOWN Description: Maintain w min assist Outcome: Progressing Goal: RH STG ABLE TO PERFORM INCISION/WOUND CARE W/ASSISTANCE Description: STG Able To Perform Incision/Wound Care With min  Assistance. Outcome: Progressing   Problem: RH SAFETY Goal: RH STG ADHERE TO SAFETY PRECAUTIONS W/ASSISTANCE/DEVICE Description: STG Adhere to Safety Precautions With cues/Assistance/Device. Outcome: Progressing   Problem: RH PAIN MANAGEMENT Goal: RH STG PAIN MANAGED AT OR BELOW PT'S PAIN GOAL Description: At or below level 4 Outcome: Progressing   Problem: RH KNOWLEDGE DEFICIT Goal: RH STG INCREASE KNOWLEDGE OF DIABETES Description: Patient will be able to manage DM with medications and dietary modifications using handouts and educational resources independently Outcome: Progressing Goal: RH STG INCREASE KNOWLEDGE OF HYPERTENSION Description: Patient will be able to manage HTN with medications and dietary modifications using handouts and educational resources independently Outcome: Progressing Goal: RH STG INCREASE KNOWLEGDE OF HYPERLIPIDEMIA Description: Patient will be able to manage HLD with medications and dietary modifications using handouts and educational resources independently Outcome: Progressing Goal: RH STG INCREASE KNOWLEDGE OF STROKE PROPHYLAXIS Description: Patient will be able to manage secondary risks with medications and dietary modifications using handouts and educational resources independently Outcome: Progressing   Problem: Education: Goal: Ability to demonstrate management of disease process will improve Description: Patient will be able to manage HF with medications and dietary modifications using handouts and educational resources independently Outcome:  Progressing Goal: Ability to verbalize understanding of medication therapies will improve Outcome: Progressing Goal: Individualized Educational Video(s) Outcome: Progressing   Problem: RH Vision Goal: RH LTG Vision (Specify) Outcome: Progressing

## 2021-02-21 NOTE — Progress Notes (Signed)
Patient ID: John [name], male   DOB: 23-Apr-1951, 70 y.o.   MRN: [id number]  Met with the patient to introduce self and role of the nurse CM. Patient is very informed of medications and secondary risk factors. Also discussed ASA + Plavix with Eliquis for one month before bx of lung. Patient reported he felt he was doing ok prior to admission. Noted hx of stent placement and NSTEMI. Reported he managed medications, completed daily weight checks and monitored blood sugar levels. Caregiver for wife PTA rlated to laundry and dishes. Children are helping while he is in the hospital. Appears to have a good handle on CMM diet and things to do to address better control. Discussed snacks and addition of protein with sugars for better metabolism. Continue to follow along to discharge to address educational needs and collaborate with the SW to facilitate preparation for discharge. Margarito Liner

## 2021-02-21 NOTE — Care Management (Signed)
[place] Individual Statement of Services  Patient Name:  John [name]  Date:  02/21/2021  Welcome to the [facility].  Our goal is to provide you with an individualized program based on your diagnosis and situation, designed to meet your specific needs.  With this comprehensive rehabilitation program, you will be expected to participate in at least 3 hours of rehabilitation therapies Monday-Friday, with modified therapy programming on the weekends.  Your rehabilitation program will include the following services:  Physical Therapy (PT), Occupational Therapy (OT), 24 hour per day rehabilitation nursing, Therapeutic Recreaction (TR), Psychology, Neuropsychology, Care Coordinator, Rehabilitation Medicine, [place], and Other  Weekly team conferences will be held on Wendesdays to discuss your progress.  Your Inpatient Rehabilitation Care Coordinator will talk with you frequently to get your input and to update you on team discussions.  Team conferences with you and your family in attendance may also be held.  Expected length of stay: 7-10 days    Overall anticipated outcome: Independent with an Assistive Device  Depending on your progress and recovery, your program may change. Your Inpatient Rehabilitation Care Coordinator will coordinate services and will keep you informed of any changes. Your Inpatient Rehabilitation Care Coordinator's name and contact numbers are listed  below.  The following services may also be recommended but are not provided by the [place] will be made to provide these services after discharge if needed.  Arrangements include referral to agencies that provide these services.  Your insurance has been verified to be:  Medicare A/B  Your  primary doctor is:  Jenna Luo  Pertinent information will be shared with your doctor and your insurance company.  Inpatient Rehabilitation Care Coordinator:  Erlene Quan, [place] or [phone]  Information discussed with and copy given to patient by: Rana Snare, 02/21/2021, 8:59 AM

## 2021-02-21 NOTE — Progress Notes (Signed)
Physical Therapy Session Note  Patient Details  Name: John [name] MRN: [id number] Date of Birth: 04/09/1951  Today's Date: 02/21/2021 PT Individual Time: 0905-1000 and 1400-1554 and 1620-1645 PT Individual Time Calculation (min): 55 min  and 54  min and 25 min   Short Term Goals: Week 1:  PT Short Term Goal 1 (Week 1): = LTGs  Skilled Therapeutic Interventions/Progress Updates:   Pt received sitting in WC and agreeable to PT. Declines pain.   Gait training with RW in hall with CGA progressing to Supervision A*. PT instructed pt in dynamic gait training with RW to step over 3 bar weights and weave through 4 cones x 4 each. Min assist to prevent LOB to the R. Cues for AD management to prevent L/anterior LOB .   Dynamic balance training on airex pad with fine motor task of wii bowling x 19mntues with UE support on RW to prevent lateral LOB to the L as well as min assist from PT for safety.   Nustep reciprocal movement training with Supervision assist x 831m with cues for safety to protect the LLE at incision. Lelv 7 throughout RPE 6/10.   Patient returned to room and performed stand pivot to recliner with RW and supervision assist. Pt left sitting in recliner with call bell in reach and all needs met.    Session 2.  Pt received sitting in WC and agreeable to PT. Denies pain.  Pt transported to entrance of [facility] training through atrium and hospital gift shop with RW and min assist intermittently due to to mild LOB with unlevel floor. Pt engaged in dual task to locate 5 items in gift shop in order. Mild decrease in safety with AD and coorections to LOB when engaged in cognitive task.   WC mobility through atrium and over cement sidewalk 2 x 30063fach with min cues for safety around obstacles and control on down hill grade.   Patient returned to room and reports need for BM. Stand pivot transfer to toilet with min assist and cues for AD management for safety. Pt  performed hand  hygiene at sink and then stand pivot to recliner with RW and supervision assist. Pt left sitting in recliner with call bell in reach and all needs met.    Session 3.   Pt received sitting in recliner and agreeable to PT. Denies pain.  PT session focused on visual deficits to improve convergance. Vision rope to perform convergence to 1 of 3 targets at midline x 8 with 10 sec hold. Performed to the R without difficulty. Attempted to performed to the L with noted nystagmus on the L eye. Performed gradual increase with cues for blink to reset focus x 10 with gradual increase to apprax 25 deg on the R. Pt then performed saccades R and L with increases ROM to the L between 3 targets. Pt educated on convergence and saccades when in room. Pt left supine in bed with call bell in reach and all needs met.        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

## 2021-02-21 NOTE — IPOC Note (Signed)
Overall Plan of Care [facility]) Patient Details Name: John [name] MRN: [id number] DOB: 1951/01/30  Admitting Diagnosis: Embolic stroke [facility])  Hospital Problems: Principal Problem:   Embolic stroke [facility])     Functional Problem List: Nursing Endurance, Medication Management, Pain, Safety  PT Balance, Edema, Safety, Endurance, Motor, Skin Integrity  OT Balance, Cognition, Endurance, Safety, Motor, Vision  SLP    TR         Basic ADL's: OT Grooming, Bathing, Dressing, Toileting     Advanced  ADL's: OT Simple Meal Preparation, Light Housekeeping     Transfers: PT Bed Mobility, Bed to Chair, Car, Manufacturing systems engineer, Metallurgist: PT Ambulation, Emergency planning/management officer, Stairs     Additional Impairments: OT Fuctional Use of Upper Extremity  SLP        TR      Anticipated Outcomes Item Anticipated Outcome  Self Feeding ind  Swallowing      Basic self-care  mod I  Toileting  mod I   Bathroom Transfers mod I  Bowel/Bladder  n/a  Transfers  modified independent basic; supervision car  Locomotion  modified independent x 50' home setting; supervision 150' controlled env and community; up/down 12 steps supervision  Communication     Cognition     Pain  at or below level 4  Safety/Judgment  maintain w cues   Therapy Plan: PT Intensity: Minimum of 1-2 x/day ,45 to 90 minutes PT Frequency: 5 out of 7 days PT Duration Estimated Length of Stay: 7-10 OT Intensity: Minimum of 1-2 x/day, 45 to 90 minutes OT Frequency: 5 out of 7 days OT Duration/Estimated Length of Stay: 7 to 10 days     Due to the current state of emergency, patients may not be receiving their 3-hours of Medicare-mandated therapy.   Team Interventions: Nursing Interventions Patient/Family Education, Pain Management, Skin Care/Wound Management, Medication Management, Disease Management/Prevention, Discharge Planning  PT interventions Ambulation/gait training, Cognitive  remediation/compensation, Discharge planning, DME/adaptive equipment instruction, Functional mobility training, Psychosocial support, Splinting/orthotics, Therapeutic Activities, UE/LE Strength taining/ROM, Visual/perceptual remediation/compensation, Training and development officer, Community reintegration, Technical sales engineer stimulation, Neuromuscular re-education, Patient/family education, IT trainer, Therapeutic Exercise, UE/LE Coordination activities, Wheelchair propulsion/positioning  OT Interventions Training and development officer, Disease mangement/prevention, Neuromuscular re-education, Self Care/advanced ADL retraining, Therapeutic Exercise, Wheelchair propulsion/positioning, UE/LE Strength taining/ROM, Skin care/wound managment, Pain management, DME/adaptive equipment instruction, Cognitive remediation/compensation, UE/LE Coordination activities, Patient/family education, Functional electrical stimulation, Community reintegration, Discharge planning, Functional mobility training, Psychosocial support, Therapeutic Activities, Visual/perceptual remediation/compensation  SLP Interventions    TR Interventions    SW/CM Interventions Discharge Planning, Psychosocial Support, Patient/Family Education   Barriers to Discharge MD  Medical stability  Nursing Decreased caregiver support, Home environment access/layout, Wound Care 1 level 5ste/ramped w wife (caregiver for wife PTA) left rail  PT      OT      SLP      SW       Team Discharge Planning: Destination: PT-Home ,OT- Home , SLP-  Projected Follow-up: PT-Home health PT, OT-  Outpatient OT, SLP-  Projected Equipment Needs: PT-Rolling walker with 5" wheels, OT- To be determined, SLP-  Equipment Details: PT- , OT-  Patient/family involved in discharge planning: PT- Patient,  OT-Patient, SLP-   MD ELOS: 8-12d Medical Rehab Prognosis:  Excellent Assessment:  70 year old RH-male with history of HTN, T2DM, GERD, lumbar surgery w/L-foot  drop, PAF-on Eliquis, VT s/p ICD/recent cardioversion, CAD s/p PTCI, infected left 2nd toe ulcer with oste s/p amputation 08/23, chronic  systolic/diastolic History taken from patient and chart review. Discussed with Cards as well- significant improvement in LUE strength. CHF who was admitted on 02/14/2021 with onset of chest pain radiating to Left arm. He had elevated troponins due to NSTEMI and was started on IV heparin/NTG and taken to cath lab on 02/15/21 for cath with PTCA/PCI of instent thrombosis w/occlusion of proximal PL. Near completion of procedure, patient noted to be lethargic with hypoglycemia, BS 63, had left facial droop with left  neglect, left gaze preference and right sided weakness. CTA head/neck patent vasculature without LVO, occlusion or aneurysm as well as soft tissue lesion LUL concerning for malignancy.  He was not felt to be a tPA candidate as had received heparin and Plavix during procedure.   Repeat Echocardiogram showed EF of 40-45% with mildly decreased function and moderately dilated LV cavity. Follow-up MRI brain showed acute infarct in right thalamus, small punctate areas in bilateral frontal lobe and right parietal white matter suggestive of cerebral emboli. Dr. Leonie Man felt that patient had periprocederal embolic stroke and recommends triple therapy of Eliquis, ASA, Plavix for one month followed by aspirin and Eliquis thereafter.  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  Dr. Raeanne Barry consulted and patient to follow-up with Dr. Valeta Harms.Podiatry has lowed up for evaluation of amp site and recommends follow up with Dr. Amalia Hailey after discharge for suture removal. PT/OT evaluations completed showing poor safety awareness with lack of insight into deficits,  balance deficits with lateral lean, difficulty with high-level tasks as well as reports of double vision with dizziness.  CIR was recommended due to functional decline. Please see preadmission assessment form earlier today as well.     See Team Conference Notes for weekly updates to the plan of care

## 2021-02-22 ENCOUNTER — Encounter: Payer: Medicare Other | Admitting: Student

## 2021-02-22 LAB — GLUCOSE, CAPILLARY
Glucose-Capillary: 135 mg/dL — ABNORMAL HIGH (ref 70–99)
Glucose-Capillary: 100 mg/dL — ABNORMAL HIGH (ref 70–99)
Glucose-Capillary: 201 mg/dL — ABNORMAL HIGH (ref 70–99)
Glucose-Capillary: 186 mg/dL — ABNORMAL HIGH (ref 70–99)

## 2021-02-22 NOTE — Progress Notes (Signed)
Physical Therapy Session Note  Patient Details  Name: John [name] MRN: [id number] Date of Birth: [birth year]  Today's Date: 02/22/2021 PT Individual Time: 0804-0901 PT Individual Time Calculation (min): 57 min   Short Term Goals: Week 1:  PT Short Term Goal 1 (Week 1): = LTGs  Skilled Therapeutic Interventions/Progress Updates:  Patient seated upright in w/c on entrance to room. Patient alert and agreeable to PT session. Pt relates improvement this morning noted in vision. Pt self assesses vision while looking at room clock and this morning noted no diplopia and clock maintaining position on wall.  Patient with no pain complaint throughout session.   Therapeutic Activity: Transfers: Patient performed sit<>stand and stand pivot transfers throughout session with supervision. Provided verbal cues for safe brake application prior to transfers to/ from w/c.   Gait Training:  Patient ambulated >250' x2/ 135' x1 using RW with CGA initially and improving to close supervision. Demonstrated adequate L foot clearance with steppage gait pattern. Provided vc/ tc for softening foot strike to reduce pressure to L foot.  Neuromuscular Re-ed: NMR facilitated during session with focus on standing balance, vision, proprioception. Pt guided in use of BITS to challenge pt's visual convergence and accommodation. First challenge using circle finding task with addition of touch to target held one foot from pt's eyeline in between each tap to target. 100% accuracy to stick and 92.81% to circle targets. Challenge adjusted to BITS system accomodation challenge with circle targets and intermittent letter change in the center of the screen. 100% accommodation attending to 11 letter changes with 93.41% accuracy to circle targets hitting 85 targets in 3 minutes.    Progressed to dynamic stepping balance challenge  with targets on wall from over head to below waist height. Pt's distance from targets increased throughout  to promote need for dynamic step to complete reach to called targets. Pt demos safe choice in stepping/ reaching and no LOB. Good control of balance with appropriate hip/ knee flexion to reach lower targets. NMR performed for improvements in motor control and coordination, balance, sequencing, judgement, and self confidence/ efficacy in performing all aspects of mobility at highest level of independence.   Patient seated in recliner at end of session with brakes locked, belt alarm set, and all needs within reach.      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

## 2021-02-22 NOTE — Progress Notes (Signed)
Inpatient Rehabilitation  Patient information reviewed and entered into [facility] system by Doaa Kendzierski Challis Crill, OTR/L.   Information including medical coding, functional ability and quality indicators will be reviewed and updated through discharge.    

## 2021-02-22 NOTE — Progress Notes (Signed)
PROGRESS NOTE   Subjective/Complaints:    ROS: + bleeding skin tear left arm, +impaired vision   Objective:   No results found. Recent Labs    02/21/21 0547  WBC 7.4  HGB 16.2  HCT 50.9  PLT 250    Recent Labs    02/21/21 0547  NA 144  K 3.5  CL 110  CO2 29  GLUCOSE 139*  BUN 13  CREATININE 1.08  CALCIUM 8.7*     Intake/Output Summary (Last 24 hours) at 02/22/2021 0838 Last data filed at 02/22/2021 0700 Gross per 24 hour  Intake 518 ml  Output 1200 ml  Net -682 ml         Physical Exam: Vital Signs Blood pressure 108/73, pulse 71, temperature 97.7 F (36.5 C), temperature source Oral, resp. rate 14, height 6' (1.829 m), weight 94.5 kg, SpO2 97 %.  General: No acute distress Mood and affect are appropriate Heart: Regular rate and rhythm no rubs murmurs or extra sounds Lungs: Clear to auscultation, breathing unlabored, no rales or wheezes Abdomen: Positive bowel sounds, soft nontender to palpation, nondistended Extremities: No clubbing, cyanosis, or edema Skin: No evidence of breakdown, no evidence of rash Neurological:     Mental Status: He is alert and oriented to person, place, and time.     Comments: Alert Mild dysphonia with occasional hoarseness and mild left facial weakness.  He was able to follow commands without difficulty.  Dysconjugate gaze and left eye slow to initiate movement.  Motor: Right upper extremity: 5/5 proximal distal Right lower extremity: 5/5 proximal distal Left upper extremity: 4+/5 proximal distally left lower extremity: Flexion, knee extension 1+/5, ankle dorsiflexion 0/5 Sensation intact light touch  Psychiatric:        Mood and Affect: Mood normal.        Behavior: Behavior normal. GU: continent    Assessment/Plan: 1. Functional deficits which require 3+ hours per day of interdisciplinary therapy in a comprehensive inpatient rehab setting. Physiatrist is  providing close team supervision and 24 hour management of active medical problems listed below. Physiatrist and rehab team continue to assess barriers to discharge/monitor patient progress toward functional and medical goals  Care Tool:  Bathing    Body parts bathed by patient: Face, Right arm, Left arm, Front perineal area, Abdomen, Chest, Buttocks, Right upper leg, Left upper leg, Right lower leg, Left lower leg         Bathing assist Assist Level: Contact Guard/Touching assist     Upper Body Dressing/Undressing Upper body dressing   What is the patient wearing?: Pull over shirt    Upper body assist Assist Level: Independent Assistive Device Comment: seated  Lower Body Dressing/Undressing Lower body dressing      What is the patient wearing?: Underwear/pull up, Pants     Lower body assist Assist for lower body dressing: Independent     Toileting Toileting    Toileting assist Assist for toileting: Minimal Assistance - Patient > 75%     Transfers Chair/bed transfer  Transfers assist     Chair/bed transfer assist level: Minimal Assistance - Patient > 75%     Locomotion Ambulation   Ambulation assist  Assist level: Moderate Assistance - Patient 50 - 74% Assistive device: No Device Max distance: 150   Walk 10 feet activity   Assist     Assist level: Moderate Assistance - Patient - 50 - 74% Assistive device: No Device   Walk 50 feet activity   Assist    Assist level: Moderate Assistance - Patient - 50 - 74% Assistive device: No Device    Walk 150 feet activity   Assist    Assist level: Moderate Assistance - Patient - 50 - 74% Assistive device: No Device    Walk 10 feet on uneven surface  activity   Assist Walk 10 feet on uneven surfaces activity did not occur: Safety/medical concerns (due to balance deficits)         Wheelchair     Assist   Type of Wheelchair: Manual    Wheelchair assist level: Supervision/Verbal  cueing Max wheelchair distance: 150    Wheelchair 50 feet with 2 turns activity    Assist        Assist Level: Supervision/Verbal cueing   Wheelchair 150 feet activity     Assist      Assist Level: Supervision/Verbal cueing   Blood pressure 108/73, pulse 71, temperature 97.7 F (36.5 C), temperature source Oral, resp. rate 14, height 6' (1.829 m), weight 94.5 kg, SpO2 97 %.  Medical Problem List and Plan: 1.  poor safety awareness with lack of insight into deficits, balance deficits with lateral lean, difficulty with high-level tasks as well as reports of double vision with dizziness secondary to right thalamic infarct, bilateral punctate frontal lobe infarcts - embolic. Given extra ocular symptoms suspect midbrain involvement but none seen on MRI              -patient may not shower             -ELOS/Goals: 8-12 days/Supervision/mod I            Continue CIR PT, OT ELOS end of week , team conf to discuss in am  2.  Antithrombotics: -DVT/anticoagulation:  Pharmaceutical: Other (comment)--Eliquis indefinitely              -antiplatelet therapy: ASA/Plavix. -->Dr. Ellyn Hack recommends triple drug therapy with discontinuation of ASA after a month? Plavix for minimum 6-12 months.  3. Pain Management: N/A 4. Mood: LCSW to follow for evaluation  and support.              -antipsychotic agents: N/a 5. Neuropsych: This patient is capable of making decisions on his own behalf. 6. Skin/Wound Care: Routine pressure relief measures 7. Fluids/Electrolytes/Nutrition: Monitor I/Os.             CMP ordered for tomorrow AM 8.  CAD/NSTEMI s/p PTCA: ON ASA, Plavix, Crestor, metoprolol              --monitor for symptoms with increase in activity.  9. H/o PAF/VT s/p ICD/ablation 8/22: Monitor HR TID and for any symptoms             --On Betapace, [name] and Metoprolol 10. T2DM: Hgb A1C-7.6.  On Jardiance and Levemir with SSI for tighter control Monitor BS ac/hs.  CBG (last 3)  Recent  Labs    02/21/21 1640 02/21/21 2152 02/22/21 0548  GLUCAP 124* 109* 135*    Controlled 9/6 11. HTN: Monitor BP tid --continue Enstresto, metoprolol and Betapace              Monitor with increased mobility Vitals:  02/22/21 0439 02/22/21 0806  BP: 108/73   Pulse: 71   Resp: 14   Temp: 97.7 F (36.5 C)   SpO2: 97% 97%   Controlled 9/6,   12. Chronic combined CHF:  EF 40-45% --evolemic. Monitor for signs of overload.  Daily weights             --Continue Entresto, Jardiance and Statin.  13. Osteomyelitis s/p L-2nd toe amp: ON Keflex for group B strep thorough 09/02.  14. COPD/Enlargening LUL mass: Continue Singulair, Budesonide nebs BID and albuterol prn SOB             --Has appt with Dr. Valeta Harms on 03/30/21 at 3:30 pm.   16. Rash on chest: continue hydrocortisone cream 1% BID- improved 17. Overweight BMI 28.26: provide dietary education  LOS: 4 days A FACE TO [place] E Emeli Goguen 02/22/2021, 8:38 AM

## 2021-02-22 NOTE — Progress Notes (Signed)
Occupational Therapy Session Note  Patient Details  Name: John [name] MRN: [id number] Date of Birth: 1950/10/23  Today's Date: 02/22/2021 OT Individual Time: 1003-1056 OT Individual Time Calculation (min): 53 min + 39 min   Short Term Goals: Week 1:  OT Short Term Goal 1 (Week 1): STG = LTG 2/ 2 ELOS  Skilled Therapeutic Interventions/Progress Updates:    Session 1 (1660-6301): Pt received in gym pass off from PT, denies pain, agreeable to therapy. Session focus on self-care retraining, activity tolerance, dynamic standing balance, BUE/core strength, convergence deficiency in prep for improved ADL/IADL/func mobility performance + decreased caregiver burden. Req to use bathroom. STS and amb to and from bathroom with RW + min A 2/2 R LOB upon entering bathroom. Pt reports "it's ok I got it"," respite req physical assist to regain balance. CGA for 3/3 toileting tasks post continent void of bladder. Amb back to gym with CGA + RW.   Pt transitioned to quadruped and able to maintain position for ~ 5 min while completing lego building activity with BUE, overall req min A for 100% accuracy of design. Pt additionally, completed 1x15 modified push-ups against unstable surface (bosu ball) and able to control ball in various directions using BUE.   To address convergence deficiency, pt participated in brock string exercises including: tracking, saccades, and focusing x5 for 10 seconds on different midpoints. Denies nausea/fatigue with activity and reports improvement in visual disturbance compared to last week. Reports being able to focus in on objects more frequently than before.  Finally, in maxisky walking sling, completed 8 passes of obstacle course without RW and min HHA. 2 instances of mod LOB to the R require mod A to stabilize. Obstacle course involved weaving in/out cones, holding cup of water, and stepping over small obstacles.    Amb transfer back to recliner with CGA + RW, one instance of  bumping into doorway on L. Pt left seated in recliner with safety belt alarm engaged, call bell in reach, and all immediate needs met.     Session 2 (1351-1430): Pt received seated in recliner, no c/o pain, agreeable to therapy. Session focus on IADL retraining, activity tolerance, dynamic standing balance, RW use in prep for improved ADL/IADL/func mobility performance + decreased caregiver burden. STS and amb to and from therapy apartment with CGA + RW, no overt LOB throughout session. Pt able to retrieve items, cook scrambled eggs, and wash/put away dishes with RW + CGA + mod Vcs for safe RW use/item transport techniques/ and to turn off stove when finished. Pt reports kitchen set-up allows for minimal transportation of items as everything is within reach. Additionally, only completes simple meal prep (eggs, mashed potatoes, cereal, etc.)  Discussed pet care as pt has large dog and possible increased falls risk (sit down prior to petting to decrease falls risk). Additionally, pt reports that daughter/son-in-law will be able to retrieve groceries, medicines, provide ride to OP therapy if possible. Reports difficulty with accepting help as he has always been ind, therapeutic use of self utilized for emotional support and life role change. Amb transfer back to recliner same manner as before.   Pt left seated in recliner with safety belt alarm engaged, call bell in reach, and all immediate needs met.    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  Therapy/Group: Individual Therapy  Volanda Napoleon MS, OTR/L  02/22/2021, 6:38 AM

## 2021-02-22 NOTE — Progress Notes (Signed)
Physical Therapy Session Note  Patient Details  Name: John [name] MRN: [id number] Date of Birth: [birth year]  Today's Date: 02/22/2021 PT Individual Time: 0937-1002 PT Individual Time Calculation (min): 25 min   Short Term Goals: Week 1:  PT Short Term Goal 1 (Week 1): = LTGs  Skilled Therapeutic Interventions/Progress Updates:    Patient in recliner in room.  Performed sit to stand to RW with CGA.  Ambulated 42' with RW and min A for balance and due to L foot weakness/coordination deficits.  Patient performed stretching on mat in dayroom as noted below. Patient sit<>supine for therex with S.  Patient standing for reaching for bean bags and tossing to cornhole without UE support and with CGA.  Patient standing for about 3 minutes to complete task.  Patient handoff to OT in dayroom at end of session.    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

## 2021-02-23 ENCOUNTER — Ambulatory Visit: Payer: Medicare Other | Admitting: Podiatry

## 2021-02-23 ENCOUNTER — Telehealth: Payer: Self-pay | Admitting: *Deleted

## 2021-02-23 LAB — GLUCOSE, CAPILLARY
Glucose-Capillary: 120 mg/dL — ABNORMAL HIGH (ref 70–99)
Glucose-Capillary: 118 mg/dL — ABNORMAL HIGH (ref 70–99)
Glucose-Capillary: 125 mg/dL — ABNORMAL HIGH (ref 70–99)
Glucose-Capillary: 138 mg/dL — ABNORMAL HIGH (ref 70–99)

## 2021-02-23 NOTE — Progress Notes (Signed)
Patient ID: John [name], male   DOB: 1950-08-28, 70 y.o.   MRN: [id number] Team Conference Report to Patient/Family  Team Conference discussion was reviewed with the patient and caregiver, including goals, any changes in plan of care and target discharge date.  Patient and caregiver express understanding and are in agreement.  The patient has a target discharge date of 02/26/21.  Sw met with pt, pt reports he will call spouse. Sw provided conference updates. Pt requesting to have d/c instructions provided on Friday. No additional questions or concerns, sw will cont to follow up  Dyanne Iha 02/23/2021, 12:55 PM

## 2021-02-23 NOTE — Progress Notes (Signed)
PROGRESS NOTE   Subjective/Complaints:  Seen in PT doing higher level balance issues, no dizziness Vision improving   ROS: neg CP, SOB, N/V/D   Objective:   No results found. Recent Labs    02/21/21 0547  WBC 7.4  HGB 16.2  HCT 50.9  PLT 250    Recent Labs    02/21/21 0547  NA 144  K 3.5  CL 110  CO2 29  GLUCOSE 139*  BUN 13  CREATININE 1.08  CALCIUM 8.7*     Intake/Output Summary (Last 24 hours) at 02/23/2021 0857 Last data filed at 02/23/2021 0100 Gross per 24 hour  Intake 472 ml  Output 400 ml  Net 72 ml         Physical Exam: Vital Signs Blood pressure 118/84, pulse 71, temperature 97.7 F (36.5 C), temperature source Oral, resp. rate 18, height 6' (1.829 m), weight 94.5 kg, SpO2 98 %.   General: No acute distress Mood and affect are appropriate Heart: Regular rate and rhythm no rubs murmurs or extra sounds Lungs: Clear to auscultation, breathing unlabored, no rales or wheezes Abdomen: Positive bowel sounds, soft nontender to palpation, nondistended Extremities: No clubbing, cyanosis, or edema Skin: No evidence of breakdown, no evidence of rash   Neurological:     Mental Status: He is alert and oriented to person, place, and time.     Comments: Alert Mild dysphonia with occasional hoarseness and mild left facial weakness.  He was able to follow commands without difficulty.  Dysconjugate gaze and left eye slow to initiate movement. Reduced upward gaze Motor: Right upper extremity: 5/5 proximal distal Right lower extremity: 5/5 proximal distal Left upper extremity: 4+/5 proximal distally left lower extremity: Flexion, knee extension 1+/5, ankle dorsiflexion 0/5 Sensation intact light touch  Psychiatric:        Mood and Affect: Mood normal.        Behavior: Behavior normal. GU: continent    Assessment/Plan: 1. Functional deficits which require 3+ hours per day of interdisciplinary  therapy in a comprehensive inpatient rehab setting. Physiatrist is providing close team supervision and 24 hour management of active medical problems listed below. Physiatrist and rehab team continue to assess barriers to discharge/monitor patient progress toward functional and medical goals  Care Tool:  Bathing    Body parts bathed by patient: Face, Right arm, Left arm, Front perineal area, Abdomen, Chest, Buttocks, Right upper leg, Left upper leg, Right lower leg, Left lower leg         Bathing assist Assist Level: Contact Guard/Touching assist     Upper Body Dressing/Undressing Upper body dressing   What is the patient wearing?: Pull over shirt    Upper body assist Assist Level: Independent Assistive Device Comment: seated  Lower Body Dressing/Undressing Lower body dressing      What is the patient wearing?: Underwear/pull up, Pants     Lower body assist Assist for lower body dressing: Independent     Toileting Toileting    Toileting assist Assist for toileting: Minimal Assistance - Patient > 75%     Transfers Chair/bed transfer  Transfers assist     Chair/bed transfer assist level: Supervision/Verbal cueing  Locomotion Ambulation   Ambulation assist      Assist level: Contact Guard/Touching assist Assistive device: Walker-rolling Max distance: 250 ft   Walk 10 feet activity   Assist     Assist level: Contact Guard/Touching assist Assistive device: No Device   Walk 50 feet activity   Assist    Assist level: Contact Guard/Touching assist Assistive device: Walker-rolling    Walk 150 feet activity   Assist    Assist level: Contact Guard/Touching assist Assistive device: Walker-rolling    Walk 10 feet on uneven surface  activity   Assist Walk 10 feet on uneven surfaces activity did not occur: Safety/medical concerns (due to balance deficits)         Wheelchair     Assist Is the patient using a wheelchair?:  (yes,  per PT note) Type of Wheelchair: Manual    Wheelchair assist level: Supervision/Verbal cueing Max wheelchair distance: 150    Wheelchair 50 feet with 2 turns activity    Assist        Assist Level: Supervision/Verbal cueing   Wheelchair 150 feet activity     Assist      Assist Level: Supervision/Verbal cueing   Blood pressure 118/84, pulse 71, temperature 97.7 F (36.5 C), temperature source Oral, resp. rate 18, height 6' (1.829 m), weight 94.5 kg, SpO2 98 %.  Medical Problem List and Plan: 1.  poor safety awareness with lack of insight into deficits, balance deficits with lateral lean, difficulty with high-level tasks as well as reports of double vision with dizziness secondary to right thalamic infarct, bilateral punctate frontal lobe infarcts - embolic. Given extra ocular symptoms suspect midbrain involvement but none seen on MRI              -patient may not shower             -ELOS/Goals: 8-12 days/Supervision/mod I            Continue CIR PT, OT ELOS end of week , Team conference today please see physician documentation under team conference tab, met with team  to discuss problems,progress, and goals. Formulized individual treatment plan based on medical history, underlying problem and comorbidities.  2.  Antithrombotics: -DVT/anticoagulation:  Pharmaceutical: Other (comment)--Eliquis indefinitely              -antiplatelet therapy: ASA/Plavix. -->Dr. Ellyn Hack recommends triple drug therapy with discontinuation of ASA after a month? Plavix for minimum 6-12 months.  3. Pain Management: N/A 4. Mood: LCSW to follow for evaluation  and support.              -antipsychotic agents: N/a 5. Neuropsych: This patient is capable of making decisions on his own behalf. 6. Skin/Wound Care: Routine pressure relief measures 7. Fluids/Electrolytes/Nutrition: Monitor I/Os.             CMP ordered for tomorrow AM 8.  CAD/NSTEMI s/p PTCA: ON ASA, Plavix, Crestor, metoprolol               --monitor for symptoms with increase in activity.  9. H/o PAF/VT s/p ICD/ablation 8/22: Monitor HR TID and for any symptoms             --On Betapace, [name] and Metoprolol 10. T2DM: Hgb A1C-7.6.  On Jardiance and Levemir with SSI for tighter control Monitor BS ac/hs.  CBG (last 3)  Recent Labs    02/22/21 1849 02/22/21 2110 02/23/21 0545  GLUCAP 201* 186* 120*    Controlled 9/7, some elevation yest pm  11. HTN: Monitor BP tid --continue Enstresto, metoprolol and Betapace              Monitor with increased mobility Vitals:   02/23/21 0457 02/23/21 0748  BP: 118/84   Pulse: 71   Resp: 18   Temp: 97.7 F (36.5 C)   SpO2: 99% 98%   Controlled 9/7  12. Chronic combined CHF:  EF 40-45% --evolemic. Monitor for signs of overload.  Daily weights             --Continue Entresto, Jardiance and Statin.  13. Osteomyelitis s/p L-2nd toe amp: ON Keflex for group B strep thorough 09/02.  14. COPD/Enlargening LUL mass: Continue Singulair, Budesonide nebs BID and albuterol prn SOB             --Has appt with Dr. Valeta Harms on 03/30/21 at 3:30 pm.   16. Rash on chest: continue hydrocortisone cream 1% BID- improved 17. Overweight BMI 28.26: provide dietary education  LOS: 5 days A FACE TO [place] E Hans Rusher 02/23/2021, 8:57 AM

## 2021-02-23 NOTE — Progress Notes (Signed)
Occupational Therapy Session Note  Patient Details  Name: John [name] MRN: [id number] Date of Birth: 10/28/1950  Today's Date: 02/23/2021 OT Individual Time: 6378-5885 OT Individual Time Calculation (min): 28 min    Short Term Goals: Week 1:  OT Short Term Goal 1 (Week 1): STG = LTG 2/ 2 ELOS  Skilled Therapeutic Interventions/Progress Updates:    Pt greeted at time of session sitting EOB, no pain, agreeable to OT session. Pt c/o RW in room scratching floor and no new caps present, wrapped with coban for short term solution to walk to/from room <> gym, which pt did with Supervision. In gym, performed 2x15 squats for glutes/thighs to lightly touch mat, cues for form. Attempted lunges but d/t poor form and difficulty achieving with amputation, terminated activity. Switched to dynamic seated activity 4# dowel hit with small beach ball for 1x30 and no misses from the pt, able to visually track and follow. Returned to room, up in recliner alarm on call bell in reach.   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

## 2021-02-23 NOTE — Progress Notes (Signed)
Occupational Therapy Session Note  Patient Details  Name: John [name] MRN: [id number] Date of Birth: 1951-05-19  Today's Date: 02/23/2021 OT Individual Time: 5697-9480 OT Individual Time Calculation (min): 53 min    Short Term Goals: Week 1:  OT Short Term Goal 1 (Week 1): STG = LTG 2/ 2 ELOS  Skilled Therapeutic Interventions/Progress Updates:    Session 1 (1655-3748): Pt received seated EOB, denies pain and need for ADL, agreeable to therapy. Session focus on self-care retraining, activity tolerance, dynamic standing balance in prep for improved ADL/IADL/func mobility performance + decreased caregiver burden. Amb throughout session with close S + RW. Participated in 2 games on wii balance board involving lateral WS with light BUE support intermittently on RW. No overt LOB throughout. Transitioned to mat table and reviewed falls recovery steps, pt reports typically keeping cell-phone on his person. Pt able to complete 1 floor transfer with S after visual demonstration. In maxisky walking sling, participated in various dynamic balance tasks without UE support including : dribbling basketball while ambulating + ball catch and toss outside BOS. 1 episode of R LOB req min A to correct. Finally, to address convergence insuffiencey, pt practiced tracking identified targets/saccades between rotating targets. Reports able to focus and read numbers on targets. Reports his vision is now primarily impaired for near objects vs far. Amb transfer back to recliner same manner as before.   Pt left in recliner with safety belt alarm engaged, call bell in reach, and all immediate needs met.      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  Therapy/Group: Individual Therapy  Volanda Napoleon MS,  OTR/L  02/23/2021, 6:42 AM

## 2021-02-23 NOTE — Progress Notes (Signed)
Patient ID: John [name], male   DOB: [birth year], 70 y.o.   MRN: [id number]  Vassie Moselle ordered through [employer], [place]

## 2021-02-23 NOTE — Progress Notes (Signed)
Physical Therapy Session Note  Patient Details  Name: John [name] MRN: [id number] Date of Birth: 1951/06/06  Today's Date: 02/23/2021 PT Individual Time: 1405-1500 PT Individual Time Calculation (min): 55 min   Short Term Goals: Week 1:  PT Short Term Goal 1 (Week 1): = LTGs  Skilled Therapeutic Interventions/Progress Updates:   Pt received sitting in recliner, and agreeable to PT. Ambulatory transfer to [facility] with RW and supervision assist for safety. Pt transported to entrance of [place]. Performed gait training over cement sidewalk with RW x 270f, 2226fwith supervision assist. Pt then performed dynamic gait training to weave through poles at entrance x 7046fith supervision assist-CGA for safety. Cues for improved step height on the LLE and safety in turns to prevent LOB to the L.   WC mobility through hall x [id number] 200ft 9f supervision assist, as well as over cement sidewalk with supervision assist x 500ft. 70f for safety and control on unlevel grade and then for safety through doorways.   Dynamic standing balance while engaged in giant connect 4 without UE support 2x 5 minutes with supervision assist for safety to prevent L LOB.   Patient returned to room and [facility] and all needs met.           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

## 2021-02-23 NOTE — Patient Care Conference (Signed)
Inpatient RehabilitationTeam Conference and Plan of Care Update Date: 02/23/2021   Time: 10:07 AM    Patient Name: John [name]      Medical Record Number: [id number]  Date of Birth: 01/08/1951 Sex: Male         Room/Bed: 4W09C/4W09C-01 Payor Info: Payor: MEDICARE / Plan: MEDICARE PART A AND B / Product Type: *No Product type* /    Admit Date/Time:  02/18/2021  1:40 PM  Primary Diagnosis:  Embolic stroke [facility])  Hospital Problems: Principal Problem:   Embolic stroke [facility])    Expected Discharge Date: Expected Discharge Date: 02/26/21  Team Members Present: Physician leading conference: Dr. Alysia Penna Social Worker Present: Erlene Quan, BSW Nurse Present: Dorien Chihuahua, RN PT Present: Barrie Folk, PT OT Present: Providence Lanius, OT PPS Coordinator present : Gunnar Fusi, SLP     Current Status/Progress Goal Weekly Team Focus  Bowel/Bladder   pt is cont of B/B. LBM is 02/21/21  pt remain cont of B/B  Toilet pt as needed   Swallow/Nutrition/ Hydration             ADL's   CGA for ambulatory shower/toilet transfers, toileting, LBD, sponge bathing at STS level, set-up to mod I for UBD/UBB/grooming while seated; occasional LOB req min to mod A to correct/minimal safety awareness deficits  mod I for ADL/bathroom transfers  LUE NMR, visual deficits compensation, balance/self-care/transfer retraining, IADL retraining, family/pt/AE/DME education, activity tolerance   Mobility   Bed Mobility = supervision (pt sleeps in recliner); Transfers = supervision/ CGA; Ambulation = CGA/ supervision; Stairs = Mod A on eval most likey improved to CGA now. Pt relates improvement in vision with reduced diplopia.  Mod I for bed mobility and transfers; Supervision using RW for ambulation.  Continued NMR for vision and balance, strengthening, improving performance in transfers, gait/ stairs, improving safety awareness   Communication             Safety/Cognition/ Behavioral  Observations            Pain   Denies pain  Pt remain free  Assess for pain qshift and provide relief as needed   Skin   Pt has amputtion site on L 2nd toe.  skin to remain clean and free of injury  Assess skin qshift and provide skin care     Discharge Planning:  Discharging home with spouse and daughter as primary caregiver able to provide 24/7 assistance. 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 BP and DM controlled; medically stable.   Patient on target to meet rehab goals: yes, currently CGA for shower and toileting,  lower body dressing and sponge bathing. Occasional loss of balance to the right with min - mod assist to correct.Also has a odd gait pattern with leading off with left lower extremity complicated by foot drop. Gait is not ideal but safe.Goals for discharge set for mod I overall with supervision for gait and steps.  *See Care Plan and progress notes for long and short-term goals.   Revisions to Treatment Plan:  Exercise for tracking   Teaching Needs: Safety, secondary risk management, medications, dietary modifications, etc  Current Barriers to Discharge: Decreased caregiver support and Home enviroment access/layout  Possible Resolutions to Barriers: Family education Practice steps (3 step entry to home)      Medical Summary Current Status: BP and CBGs controlled, gaze issues improving  Barriers to Discharge: Medical stability  Possible Resolutions to [employer] Focus: cont with gazeconvergence exercise, family ed   Continued Need for Acute Rehabilitation Level of Care: The patient requires daily medical management by a physician with specialized training in physical medicine and rehabilitation for the following reasons: Direction of a multidisciplinary physical rehabilitation program to maximize functional independence : Yes Medical  management of patient stability for increased activity during participation in an intensive rehabilitation regime.: Yes Analysis of laboratory values and/or radiology reports with any subsequent need for medication adjustment and/or medical intervention. : Yes   I attest that I was present, lead the team conference, and concur with the assessment and plan of the team.   Dorien Chihuahua B 02/23/2021, 11:44 AM

## 2021-02-23 NOTE — Progress Notes (Signed)
Physical Therapy Session Note  Patient Details  Name: John [name] MRN: [id number] Date of Birth: 06-Jul-1950  Today's Date: 02/23/2021 PT Individual Time: 0904-1000 PT Individual Time Calculation (min): 56 min   Short Term Goals: Week 1:  PT Short Term Goal 1 (Week 1): = LTGs   Skilled Therapeutic Interventions/Progress Updates:   Pt received sitting in WC and agreeable to PT  Gait training in hall x 142f, 1564fx2 and 18024fSupervision assist for safety from PT throughout session with min cues for wide step width and improved step height on the L. Dynamic gait training with RW forward/reverse x 25f34f2 with cues for AD management and improved posture in reverse. Side stepping R and L with RW and CGA from PT as well as improved sequencing of step pattern.   Dynamic balance training, standing on blue wedge to perform lateral reach and then ben bag toss to target. Standing on flat surface to perform ball toss off rebounder. Forward and lateral R and L x 1 min each direction with cues for midline in standing to prevent LOB to the L.   Education for d/c planning with pt reporting preference for HHPT over OPPT due to lack of access to transportation  Stair management training with BUE support on rails and step to gait pattern. Supervision assist with cues for step management with step to gait pattern. Pt noted to prefer to lead with LLE with ascent, and reports no pain at incision site.   Patient returned to room and performed stand pivot to recliner with RW and supervision assist for safety. Pt left sitting in recliner with call bell in reach and all needs met.               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

## 2021-02-23 NOTE — Telephone Encounter (Signed)
Mariam w/[facility]-([phone]) is asking that  the CT left toe w/ contrast study to be changed to CT w/o contrast before they can schedule patient. Please advise.

## 2021-02-23 NOTE — Plan of Care (Signed)
  Problem: RH Ambulation Goal: LTG Patient will ambulate in home environment (PT) Description: LTG: Patient will ambulate in home environment, # of feet with assistance (PT). Flowsheets Taken 02/23/2021 0946 by Lorie Phenix, PT LTG: Pt will ambulate in home environ  assist needed:: Supervision/Verbal cueing Taken 02/19/2021 1655 by Frederic Jericho, PT LTG: Ambulation distance in home environment: 50 Note: Continued awareness deficits limit safety.

## 2021-02-24 ENCOUNTER — Inpatient Hospital Stay (HOSPITAL_COMMUNITY)
Admission: AD | Admit: 2021-02-24 | Discharge: 2021-02-26 | DRG: 309 | Disposition: A | Discharge status: Home / Self Care | Payer: Medicare Other | Source: Ambulatory Visit | Attending: Neurology | Admitting: Neurology

## 2021-02-24 ENCOUNTER — Encounter: Payer: Medicare Other | Admitting: Cardiology

## 2021-02-24 ENCOUNTER — Other Ambulatory Visit: Payer: Self-pay

## 2021-02-24 DIAGNOSIS — I25110 Atherosclerotic heart disease of native coronary artery with unstable angina pectoris: Secondary | ICD-10-CM | POA: Diagnosis not present

## 2021-02-24 DIAGNOSIS — I4891 Unspecified atrial fibrillation: Secondary | ICD-10-CM | POA: Diagnosis present

## 2021-02-24 DIAGNOSIS — I712 Thoracic aortic aneurysm, without rupture, unspecified: Secondary | ICD-10-CM | POA: Diagnosis present

## 2021-02-24 DIAGNOSIS — Z7951 Long term (current) use of inhaled steroids: Secondary | ICD-10-CM

## 2021-02-24 DIAGNOSIS — I5022 Chronic systolic (congestive) heart failure: Secondary | ICD-10-CM | POA: Diagnosis present

## 2021-02-24 DIAGNOSIS — J449 Chronic obstructive pulmonary disease, unspecified: Secondary | ICD-10-CM | POA: Diagnosis not present

## 2021-02-24 DIAGNOSIS — I472 Ventricular tachycardia, unspecified: Secondary | ICD-10-CM

## 2021-02-24 DIAGNOSIS — I10 Essential (primary) hypertension: Secondary | ICD-10-CM | POA: Diagnosis present

## 2021-02-24 DIAGNOSIS — I69392 Facial weakness following cerebral infarction: Secondary | ICD-10-CM | POA: Diagnosis not present

## 2021-02-24 DIAGNOSIS — I5042 Chronic combined systolic (congestive) and diastolic (congestive) heart failure: Secondary | ICD-10-CM | POA: Diagnosis not present

## 2021-02-24 DIAGNOSIS — E118 Type 2 diabetes mellitus with unspecified complications: Secondary | ICD-10-CM | POA: Diagnosis present

## 2021-02-24 DIAGNOSIS — Z91013 Allergy to seafood: Secondary | ICD-10-CM

## 2021-02-24 DIAGNOSIS — Z955 Presence of coronary angioplasty implant and graft: Secondary | ICD-10-CM

## 2021-02-24 DIAGNOSIS — I2510 Atherosclerotic heart disease of native coronary artery without angina pectoris: Secondary | ICD-10-CM | POA: Diagnosis present

## 2021-02-24 DIAGNOSIS — Z8619 Personal history of other infectious and parasitic diseases: Secondary | ICD-10-CM

## 2021-02-24 DIAGNOSIS — B951 Streptococcus, group B, as the cause of diseases classified elsewhere: Secondary | ICD-10-CM | POA: Diagnosis not present

## 2021-02-24 DIAGNOSIS — Z95810 Presence of automatic (implantable) cardiac defibrillator: Secondary | ICD-10-CM

## 2021-02-24 DIAGNOSIS — I2582 Chronic total occlusion of coronary artery: Secondary | ICD-10-CM | POA: Diagnosis present

## 2021-02-24 DIAGNOSIS — Z8679 Personal history of other diseases of the circulatory system: Secondary | ICD-10-CM

## 2021-02-24 DIAGNOSIS — E782 Mixed hyperlipidemia: Secondary | ICD-10-CM | POA: Diagnosis present

## 2021-02-24 DIAGNOSIS — M869 Osteomyelitis, unspecified: Secondary | ICD-10-CM | POA: Diagnosis not present

## 2021-02-24 DIAGNOSIS — Z7902 Long term (current) use of antithrombotics/antiplatelets: Secondary | ICD-10-CM

## 2021-02-24 DIAGNOSIS — Z888 Allergy status to other drugs, medicaments and biological substances status: Secondary | ICD-10-CM

## 2021-02-24 DIAGNOSIS — I110 Hypertensive heart disease with heart failure: Secondary | ICD-10-CM | POA: Diagnosis present

## 2021-02-24 DIAGNOSIS — I63139 Cerebral infarction due to embolism of unspecified carotid artery: Secondary | ICD-10-CM | POA: Diagnosis not present

## 2021-02-24 DIAGNOSIS — I639 Cerebral infarction, unspecified: Secondary | ICD-10-CM | POA: Diagnosis present

## 2021-02-24 DIAGNOSIS — I4892 Unspecified atrial flutter: Secondary | ICD-10-CM | POA: Diagnosis present

## 2021-02-24 DIAGNOSIS — Z7901 Long term (current) use of anticoagulants: Secondary | ICD-10-CM

## 2021-02-24 DIAGNOSIS — Z8673 Personal history of transient ischemic attack (TIA), and cerebral infarction without residual deficits: Secondary | ICD-10-CM | POA: Diagnosis not present

## 2021-02-24 DIAGNOSIS — K219 Gastro-esophageal reflux disease without esophagitis: Secondary | ICD-10-CM | POA: Diagnosis present

## 2021-02-24 DIAGNOSIS — Z794 Long term (current) use of insulin: Secondary | ICD-10-CM

## 2021-02-24 DIAGNOSIS — Z8249 Family history of ischemic heart disease and other diseases of the circulatory system: Secondary | ICD-10-CM

## 2021-02-24 DIAGNOSIS — Z87891 Personal history of nicotine dependence: Secondary | ICD-10-CM

## 2021-02-24 DIAGNOSIS — Z7984 Long term (current) use of oral hypoglycemic drugs: Secondary | ICD-10-CM

## 2021-02-24 DIAGNOSIS — Z89422 Acquired absence of other left toe(s): Secondary | ICD-10-CM | POA: Diagnosis not present

## 2021-02-24 DIAGNOSIS — E785 Hyperlipidemia, unspecified: Secondary | ICD-10-CM | POA: Diagnosis present

## 2021-02-24 DIAGNOSIS — I69398 Other sequelae of cerebral infarction: Secondary | ICD-10-CM | POA: Diagnosis not present

## 2021-02-24 DIAGNOSIS — E669 Obesity, unspecified: Secondary | ICD-10-CM

## 2021-02-24 DIAGNOSIS — I249 Acute ischemic heart disease, unspecified: Secondary | ICD-10-CM | POA: Diagnosis present

## 2021-02-24 DIAGNOSIS — I252 Old myocardial infarction: Secondary | ICD-10-CM

## 2021-02-24 DIAGNOSIS — I959 Hypotension, unspecified: Secondary | ICD-10-CM | POA: Diagnosis not present

## 2021-02-24 DIAGNOSIS — E1169 Type 2 diabetes mellitus with other specified complication: Secondary | ICD-10-CM

## 2021-02-24 DIAGNOSIS — I255 Ischemic cardiomyopathy: Secondary | ICD-10-CM | POA: Diagnosis present

## 2021-02-24 DIAGNOSIS — I480 Paroxysmal atrial fibrillation: Secondary | ICD-10-CM | POA: Diagnosis not present

## 2021-02-24 DIAGNOSIS — Z79899 Other long term (current) drug therapy: Secondary | ICD-10-CM

## 2021-02-24 DIAGNOSIS — G4733 Obstructive sleep apnea (adult) (pediatric): Secondary | ICD-10-CM | POA: Diagnosis present

## 2021-02-24 DIAGNOSIS — E663 Overweight: Secondary | ICD-10-CM | POA: Diagnosis not present

## 2021-02-24 DIAGNOSIS — Z9989 Dependence on other enabling machines and devices: Secondary | ICD-10-CM

## 2021-02-24 DIAGNOSIS — E1151 Type 2 diabetes mellitus with diabetic peripheral angiopathy without gangrene: Secondary | ICD-10-CM | POA: Diagnosis present

## 2021-02-24 DIAGNOSIS — E119 Type 2 diabetes mellitus without complications: Secondary | ICD-10-CM

## 2021-02-24 DIAGNOSIS — R911 Solitary pulmonary nodule: Secondary | ICD-10-CM | POA: Diagnosis present

## 2021-02-24 DIAGNOSIS — Z7982 Long term (current) use of aspirin: Secondary | ICD-10-CM

## 2021-02-24 DIAGNOSIS — E1143 Type 2 diabetes mellitus with diabetic autonomic (poly)neuropathy: Secondary | ICD-10-CM

## 2021-02-24 LAB — CBC
HCT: 49.5 % (ref 39.0–52.0)
Hemoglobin: 16.2 g/dL (ref 13.0–17.0)
MCH: 30.3 pg (ref 26.0–34.0)
MCHC: 32.7 g/dL (ref 30.0–36.0)
MCV: 92.7 fL (ref 80.0–100.0)
Platelets: 262 10*3/uL (ref 150–400)
RBC: 5.34 MIL/uL (ref 4.22–5.81)
RDW: 13.8 % (ref 11.5–15.5)
WBC: 7.3 10*3/uL (ref 4.0–10.5)
nRBC: 0 % (ref 0.0–0.2)

## 2021-02-24 LAB — BASIC METABOLIC PANEL
Anion gap: 7 (ref 5–15)
BUN: 17 mg/dL (ref 8–23)
CO2: 24 mmol/L (ref 22–32)
Calcium: 8.7 mg/dL — ABNORMAL LOW (ref 8.9–10.3)
Chloride: 109 mmol/L (ref 98–111)
Creatinine, Ser: 1.31 mg/dL — ABNORMAL HIGH (ref 0.61–1.24)
GFR, Estimated: 58 mL/min — ABNORMAL LOW (ref >60)
Glucose, Bld: 162 mg/dL — ABNORMAL HIGH (ref 70–99)
Potassium: 4.1 mmol/L (ref 3.5–5.1)
Sodium: 140 mmol/L (ref 135–145)

## 2021-02-24 LAB — GLUCOSE, CAPILLARY
Glucose-Capillary: 146 mg/dL — ABNORMAL HIGH (ref 70–99)
Glucose-Capillary: 135 mg/dL — ABNORMAL HIGH (ref 70–99)
Glucose-Capillary: 138 mg/dL — ABNORMAL HIGH (ref 70–99)
Glucose-Capillary: 130 mg/dL — ABNORMAL HIGH (ref 70–99)

## 2021-02-24 MED ORDER — HYDROCODONE-ACETAMINOPHEN 5-325 MG PO TABS
1.0000 | ORAL_TABLET | Freq: Four times a day (QID) | ORAL | Status: DC | PRN
  Administered 2021-02-26: 1 via ORAL
  Filled 2021-02-24: qty 1

## 2021-02-24 MED ORDER — TRAZODONE HCL 50 MG PO TABS: 25.0000 mg | ORAL_TABLET | Freq: Every evening | ORAL | Status: DC | PRN

## 2021-02-24 MED ORDER — EMPAGLIFLOZIN 25 MG PO TABS
25.0000 mg | ORAL_TABLET | Freq: Every day | ORAL | Status: DC
  Administered 2021-02-25 – 2021-02-26 (×2): 25 mg via ORAL
  Filled 2021-02-24 (×2): qty 1

## 2021-02-24 MED ORDER — MEXILETINE HCL 150 MG PO CAPS
300.0000 mg | ORAL_CAPSULE | Freq: Two times a day (BID) | ORAL | Status: DC
  Administered 2021-02-24 – 2021-02-26 (×4): 300 mg via ORAL
  Filled 2021-02-24 (×6): qty 2

## 2021-02-24 MED ORDER — INSULIN ASPART 100 UNIT/ML IJ SOLN
0.0000 [IU] | Freq: Three times a day (TID) | INTRAMUSCULAR | Status: DC
  Administered 2021-02-24: 2 [IU] via SUBCUTANEOUS
  Administered 2021-02-25: 3 [IU] via SUBCUTANEOUS

## 2021-02-24 MED ORDER — SODIUM CHLORIDE 0.9 % IV SOLN: 250.0000 mL | INTRAVENOUS | Status: DC | PRN

## 2021-02-24 MED ORDER — MONTELUKAST SODIUM 10 MG PO TABS
10.0000 mg | ORAL_TABLET | Freq: Every day | ORAL | Status: DC
  Administered 2021-02-25 – 2021-02-26 (×2): 10 mg via ORAL
  Filled 2021-02-24 (×2): qty 1

## 2021-02-24 MED ORDER — EXERCISE FOR HEART AND HEALTH BOOK
Freq: Once | Status: AC
  Filled 2021-02-24: qty 1

## 2021-02-24 MED ORDER — APIXABAN 5 MG PO TABS
5.0000 mg | ORAL_TABLET | Freq: Two times a day (BID) | ORAL | Status: DC
  Administered 2021-02-24 – 2021-02-26 (×4): 5 mg via ORAL
  Filled 2021-02-24 (×2): qty 1
  Filled 2021-02-24: qty 2
  Filled 2021-02-24: qty 1

## 2021-02-24 MED ORDER — ACETAMINOPHEN 325 MG PO TABS: 650.0000 mg | ORAL_TABLET | ORAL | Status: DC | PRN

## 2021-02-24 MED ORDER — METOPROLOL SUCCINATE ER 100 MG PO TB24
100.0000 mg | ORAL_TABLET | Freq: Every day | ORAL | Status: DC
  Administered 2021-02-24 – 2021-02-26 (×3): 100 mg via ORAL
  Filled 2021-02-24 (×3): qty 1

## 2021-02-24 MED ORDER — BUDESONIDE 0.5 MG/2ML IN SUSP
2.0000 mL | Freq: Two times a day (BID) | RESPIRATORY_TRACT | Status: DC
  Administered 2021-02-24 – 2021-02-26 (×4): 0.5 mg via RESPIRATORY_TRACT
  Filled 2021-02-24 (×4): qty 2

## 2021-02-24 MED ORDER — SACUBITRIL-VALSARTAN 24-26 MG PO TABS
1.0000 | ORAL_TABLET | Freq: Two times a day (BID) | ORAL | Status: DC
  Filled 2021-02-24: qty 1

## 2021-02-24 MED ORDER — ALBUTEROL SULFATE (2.5 MG/3ML) 0.083% IN NEBU: 3.0000 mL | INHALATION_SOLUTION | Freq: Four times a day (QID) | RESPIRATORY_TRACT | Status: DC | PRN

## 2021-02-24 MED ORDER — SODIUM CHLORIDE 0.9% FLUSH
3.0000 mL | Freq: Two times a day (BID) | INTRAVENOUS | Status: DC
  Administered 2021-02-24 – 2021-02-26 (×5): 3 mL via INTRAVENOUS

## 2021-02-24 MED ORDER — NITROGLYCERIN 0.4 MG SL SUBL
0.4000 mg | SUBLINGUAL_TABLET | SUBLINGUAL | Status: DC | PRN
Start: 2021-02-24 — End: 2021-02-24

## 2021-02-24 MED ORDER — CLOPIDOGREL BISULFATE 75 MG PO TABS
75.0000 mg | ORAL_TABLET | Freq: Every day | ORAL | Status: DC
Start: 2021-02-25 — End: 2021-02-26
  Administered 2021-02-25 – 2021-02-26 (×2): 75 mg via ORAL
  Filled 2021-02-24 (×2): qty 1

## 2021-02-24 MED ORDER — SOTALOL HCL 80 MG PO TABS
160.0000 mg | ORAL_TABLET | Freq: Two times a day (BID) | ORAL | Status: DC
  Administered 2021-02-24 – 2021-02-26 (×4): 160 mg via ORAL
  Filled 2021-02-24 (×4): qty 2

## 2021-02-24 MED ORDER — SODIUM CHLORIDE 0.9% FLUSH: 3.0000 mL | INTRAVENOUS | Status: DC | PRN

## 2021-02-24 MED ORDER — MEXILETINE HCL 150 MG PO CAPS
150.0000 mg | ORAL_CAPSULE | Freq: Once | ORAL | Status: AC
  Administered 2021-02-24: 150 mg via ORAL
  Filled 2021-02-24: qty 1

## 2021-02-24 MED ORDER — MEXILETINE HCL 150 MG PO CAPS
150.0000 mg | ORAL_CAPSULE | Freq: Two times a day (BID) | ORAL | Status: DC
  Filled 2021-02-24: qty 1

## 2021-02-24 MED ORDER — HYDROCORTISONE 1 % EX CREA
TOPICAL_CREAM | Freq: Two times a day (BID) | CUTANEOUS | Status: DC
  Administered 2021-02-24: 1 via TOPICAL
  Filled 2021-02-24: qty 28

## 2021-02-24 MED ORDER — METOPROLOL SUCCINATE ER 50 MG PO TB24: 100.0000 mg | ORAL_TABLET | Freq: Every day | ORAL | Status: DC

## 2021-02-24 MED ORDER — ROSUVASTATIN CALCIUM 20 MG PO TABS
40.0000 mg | ORAL_TABLET | Freq: Every day | ORAL | Status: DC
  Administered 2021-02-25 – 2021-02-26 (×2): 40 mg via ORAL
  Filled 2021-02-24 (×2): qty 2

## 2021-02-24 MED ORDER — ASPIRIN EC 81 MG PO TBEC
81.0000 mg | DELAYED_RELEASE_TABLET | Freq: Every day | ORAL | Status: DC
  Administered 2021-02-25 – 2021-02-26 (×2): 81 mg via ORAL
  Filled 2021-02-24 (×2): qty 1

## 2021-02-24 MED ORDER — POLYETHYLENE GLYCOL 3350 17 G PO PACK: 17.0000 g | PACK | Freq: Every day | ORAL | Status: DC | PRN

## 2021-02-24 MED ORDER — NITROGLYCERIN 0.4 MG SL SUBL: 0.4000 mg | SUBLINGUAL_TABLET | SUBLINGUAL | Status: DC | PRN

## 2021-02-24 NOTE — Progress Notes (Signed)
Patient ID: John [name], male   DOB: February 04, 1951, 70 y.o.   MRN: [id number]  Pt referral sent to Neuro Rehab [facility]   [place], [place]

## 2021-02-24 NOTE — Plan of Care (Signed)
  Problem: RH Light Housekeeping Goal: LTG Patient will perform light housekeeping w/assist (OT) Description: LTG: Patient will perform light housekeeping with assistance, with/without cues (OT). Outcome: Not Met (add Reason) Flowsheets (Taken 02/24/2021 1502) LTG: Pt will perform light housekeeping with assistance level of: (Not met 2/2 unplanned DC.) -- Note: Not met 2/2 unplanned DC.   Problem: RH Balance Goal: LTG Patient will maintain dynamic standing with ADLs (OT) Description: LTG:  Patient will maintain dynamic standing balance with assist during activities of daily living (OT)  Outcome: Completed/Met   Problem: Sit to Stand Goal: LTG:  Patient will perform sit to stand in prep for activites of daily living with assistance level (OT) Description: LTG:  Patient will perform sit to stand in prep for activites of daily living with assistance level (OT) Outcome: Completed/Met   Problem: RH Grooming Goal: LTG Patient will perform grooming w/assist,cues/equip (OT) Description: LTG: Patient will perform grooming with assist, with/without cues using equipment (OT) Outcome: Completed/Met   Problem: RH Bathing Goal: LTG Patient will bathe all body parts with assist levels (OT) Description: LTG: Patient will bathe all body parts with assist levels (OT) Outcome: Completed/Met   Problem: RH Dressing Goal: LTG Patient will perform upper body dressing (OT) Description: LTG Patient will perform upper body dressing with assist, with/without cues (OT). Outcome: Completed/Met Goal: LTG Patient will perform lower body dressing w/assist (OT) Description: LTG: Patient will perform lower body dressing with assist, with/without cues in positioning using equipment (OT) Outcome: Completed/Met   Problem: RH Toileting Goal: LTG Patient will perform toileting task (3/3 steps) with assistance level (OT) Description: LTG: Patient will perform toileting task (3/3 steps) with assistance level (OT)   Outcome: Completed/Met   Problem: RH Vision Goal: RH LTG Vision (Specify) Outcome: Completed/Met   Problem: RH Functional Use of Upper Extremity Goal: LTG Patient will use RT/LT upper extremity as a (OT) Description: LTG: Patient will use right/left upper extremity as a stabilizer/gross assist/diminished/nondominant/dominant level with assist, with/without cues during functional activity (OT) Outcome: Completed/Met   Problem: RH Simple Meal Prep Goal: LTG Patient will perform simple meal prep w/assist (OT) Description: LTG: Patient will perform simple meal prep with assistance, with/without cues (OT). Outcome: Completed/Met   Problem: RH Toilet Transfers Goal: LTG Patient will perform toilet transfers w/assist (OT) Description: LTG: Patient will perform toilet transfers with assist, with/without cues using equipment (OT) Outcome: Completed/Met   Problem: RH Tub/Shower Transfers Goal: LTG Patient will perform tub/shower transfers w/assist (OT) Description: LTG: Patient will perform tub/shower transfers with assist, with/without cues using equipment (OT) Outcome: Completed/Met   Problem: RH Awareness Goal: LTG: Patient will demonstrate awareness during functional activites type of (OT) Description: LTG: Patient will demonstrate awareness during functional activites type of (OT) Outcome: Completed/Met   Problem: RH Furniture Transfers Goal: LTG Patient will perform furniture transfers w/assist (OT/PT) Description: LTG: Patient will perform furniture transfers  with assistance (OT/PT). Outcome: Completed/Met

## 2021-02-24 NOTE — Progress Notes (Signed)
Physical Therapy Note  Patient Details  Name: John [name] MRN: [id number] Date of Birth: [birth year] Today's Date: 02/24/2021    Physical Therapy Discharge Note  This patient was unable to complete the inpatient rehab program due to unexpected d/c to acute setting with ongoing arrhythmias with RH ranging from 80-130 as well as persistent hypotension; therefore did not meet their long term goals. Pt left the program at a min A assist level for their functional mobility/ transfers. This patient is being discharged from PT services at this time.  Pt's perception of pain in the last five days was unable to answer at this time.    See CareTool for functional status details  If the patient is able to return to inpatient rehabilitation within 3 midnights, this may be considered an interrupted stay and therapy services will resume as ordered. Modification and reinstatement of their goals will be made upon completion of therapy service reevaluations.     Reginia Naas [place], [place] 02/24/2021, 4:19 PM

## 2021-02-24 NOTE — Progress Notes (Addendum)
PROGRESS NOTE   Subjective/Complaints: Pt experienced an episode where he felt cool,clammy, light-headed when up with therapy. HR elevated, bp 80'-90's/50-60's. Back in bed and says he feels fine now. No CP or SOB  ROS: Patient denies fever, rash, sore throat, blurred vision, nausea, vomiting, diarrhea, cough, shortness of breath or chest pain, joint or back pain, headache, or mood change.    Objective:   No results found. No results for input(s): WBC, HGB, HCT, PLT in the last 72 hours. No results for input(s): NA, K, CL, CO2, GLUCOSE, BUN, CREATININE, CALCIUM in the last 72 hours.  Intake/Output Summary (Last 24 hours) at 02/24/2021 1032 Last data filed at 02/24/2021 0700 Gross per 24 hour  Intake 336 ml  Output --  Net 336 ml        Physical Exam: Vital Signs Blood pressure (!) 80/60, pulse 83, temperature 98.1 F (36.7 C), temperature source Oral, resp. rate 17, height 6' (1.829 m), weight 94.5 kg, SpO2 95 %.   Constitutional: No distress . Vital signs reviewed. HEENT: NCAT, EOMI, oral membranes moist Neck: supple Cardiovascular: tachy around 115-120 without murmur    Respiratory/Chest: CTA Bilaterally without wheezes or rales. Normal effort    GI/Abdomen: BS +, non-tender, non-distended Ext: no clubbing, cyanosis, or edema Psych: pleasant and cooperative  Skin: No evidence of breakdown, no evidence of rash Neurological:     Mental Status: He is alert and oriented to person, place, and time.     Comments: Alert Mild dysphonia with occasional hoarseness and mild left facial weakness.  He was able to follow commands without difficulty.  Dysconjugate gaze and left eye slow.  Reduced upward gaze Motor: Right upper extremity: 5/5 proximal distal Right lower extremity: 5/5 proximal distal Left upper extremity: 4+/5 proximal distally left lower extremity: Flexion, knee extension 1+/5, ankle dorsiflexion 0/5 Sensation  intact light touch  Psychiatric:        Mood and Affect: Mood normal.        Behavior: Behavior normal. GU: continent    Assessment/Plan: 1. Functional deficits which require 3+ hours per day of interdisciplinary therapy in a comprehensive inpatient rehab setting. Physiatrist is providing close team supervision and 24 hour management of active medical problems listed below. Physiatrist and rehab team continue to assess barriers to discharge/monitor patient progress toward functional and medical goals  Care Tool:  Bathing    Body parts bathed by patient: Face, Right arm, Left arm, Front perineal area, Abdomen, Chest, Buttocks, Right upper leg, Left upper leg, Right lower leg, Left lower leg         Bathing assist Assist Level: Contact Guard/Touching assist     Upper Body Dressing/Undressing Upper body dressing   What is the patient wearing?: Pull over shirt    Upper body assist Assist Level: Independent Assistive Device Comment: seated  Lower Body Dressing/Undressing Lower body dressing      What is the patient wearing?: Underwear/pull up, Pants     Lower body assist Assist for lower body dressing: Independent     Toileting Toileting    Toileting assist Assist for toileting: Minimal Assistance - Patient > 75%     Transfers Chair/bed transfer  Transfers assist     Chair/bed transfer assist level: Supervision/Verbal cueing     Locomotion Ambulation   Ambulation assist      Assist level: Contact Guard/Touching assist Assistive device: Walker-rolling Max distance: 250 ft   Walk 10 feet activity   Assist     Assist level: Contact Guard/Touching assist Assistive device: No Device   Walk 50 feet activity   Assist    Assist level: Contact Guard/Touching assist Assistive device: Walker-rolling    Walk 150 feet activity   Assist    Assist level: Contact Guard/Touching assist Assistive device: Walker-rolling    Walk 10 feet on uneven  surface  activity   Assist Walk 10 feet on uneven surfaces activity did not occur: Safety/medical concerns (due to balance deficits)         Wheelchair     Assist Is the patient using a wheelchair?:  (yes, per PT note) Type of Wheelchair: Manual    Wheelchair assist level: Supervision/Verbal cueing Max wheelchair distance: 150    Wheelchair 50 feet with 2 turns activity    Assist        Assist Level: Supervision/Verbal cueing   Wheelchair 150 feet activity     Assist      Assist Level: Supervision/Verbal cueing   Blood pressure (!) 80/60, pulse 83, temperature 98.1 F (36.7 C), temperature source Oral, resp. rate 17, height 6' (1.829 m), weight 94.5 kg, SpO2 95 %.  Medical Problem List and Plan: 1.  poor safety awareness with lack of insight into deficits, balance deficits with lateral lean, difficulty with high-level tasks as well as reports of double vision with dizziness secondary to right thalamic infarct, bilateral punctate frontal lobe infarcts - embolic. Given extra ocular symptoms suspect midbrain involvement but none seen on MRI              -patient may not shower             -ELOS/Goals: 8-12 days/Supervision/mod I            -Continue CIR therapies including PT, OT   -activities as tolerated today 2.  Antithrombotics: -DVT/anticoagulation:  Pharmaceutical: Other (comment)--Eliquis indefinitely              -antiplatelet therapy: ASA/Plavix. -->Dr. Ellyn Hack recommends triple drug therapy with discontinuation of ASA after a month? Plavix for minimum 6-12 months.  3. Pain Management: N/A 4. Mood: LCSW to follow for evaluation  and support.              -antipsychotic agents: N/a 5. Neuropsych: This patient is capable of making decisions on his own behalf. 6. Skin/Wound Care: Routine pressure relief measures 7. Fluids/Electrolytes/Nutrition: Monitor I/Os.             CMP ordered for tomorrow AM 8.  CAD/NSTEMI s/p PTCA: ON ASA, Plavix, Crestor,  metoprolol              --monitor for symptoms with increase in activity.  9. H/o PAF/VT s/p ICD/ablation 8/22: Monitor HR TID and for any symptoms             --On Betapace, [name] and Metoprolol  -HR elevated to 130's on EKG this morning surrounding episode above  -ultimately consider increase of metoprolol to 100mg  if bp can support  -holding metoprolol and entresto for now  -TEDS, abd binder when up  -push fluids  -will ask cardiology to see     -check labs today 10. T2DM: Hgb A1C-7.6.  On Jardiance  and Levemir with SSI for tighter control Monitor BS ac/hs.  CBG (last 3)  Recent Labs    02/23/21 1644 02/23/21 2051 02/24/21 0613  GLUCAP 125* 138* 146*   Controlled 9/8 essentially 11. HTN: Monitor BP tid --continue Enstresto, metoprolol and Betapace              Monitor with increased mobility Vitals:   02/24/21 0954 02/24/21 0958  BP: (!) 83/60 (!) 80/60  Pulse: (!) 53 83  Resp: 17   Temp: 98.1 F (36.7 C) 98.1 F (36.7 C)  SpO2: 95% 95%   Bp's low. See #9  12. Chronic combined CHF:  EF 40-45% --evolemic. Monitor for signs of overload.  Filed Weights   02/18/21 1347 02/18/21 1531  Weight: 94.5 kg 94.5 kg   -need weights--none for 6 days             --Continue Entresto, Jardiance and Statin as tolerated.  13. Osteomyelitis s/p L-2nd toe amp: ON Keflex for group B strep thorough 09/02.  14. COPD/Enlargening LUL mass: Continue Singulair, Budesonide nebs BID and albuterol prn SOB             --Has appt with Dr. Valeta Harms on 03/30/21 at 3:30 pm.   16. Rash on chest: continue hydrocortisone cream 1% BID- improved 17. Overweight BMI 28.26: provide dietary education  LOS: 6 days A FACE TO FACE EVALUATION WAS PERFORMED  Meredith Staggers 02/24/2021, 10:32 AM

## 2021-02-24 NOTE — Progress Notes (Signed)
Inpatient Rehabilitation Care Coordinator Discharge Note   Patient Details  Name: John [name] MRN: [id number] Date of Birth: July 30, 1950   Discharge location: D/C to [place]  Length of Stay: 6 Days  Discharge activity level:    Home/community participation:    Patient response GY:FVCBSW Literacy - How often do you need to have someone help you when you read instructions, pamphlets, or other written material from your doctor or pharmacy?: Never  Patient response HQ:PRFFMB Isolation - How often do you feel lonely or isolated from those around you?: Never  Services provided included: MD, RD, PT, OT, SLP, RN, CM, TR, Pharmacy, Neuropsych, SW  Financial Services:  Financial Services Utilized: Medicare    Choices offered to/list presented to: pt  Follow-up services arranged:  Outpatient    Outpatient Servicies: [facility]      Patient response to transportation need: Is the patient able to respond to transportation needs?: Yes In the past 12 months, has lack of transportation kept you from medical appointments or from getting medications?: No In the past 12 months, has lack of transportation kept you from meetings, work, or from getting things needed for daily living?: No    Comments (or additional information):  Patient/Family verbalized understanding of follow-up arrangements:  Yes  Individual responsible for coordination of the follow-up plan: [name] [phone]  Confirmed correct DME delivered: Dyanne Iha 02/24/2021    Dyanne Iha

## 2021-02-24 NOTE — Progress Notes (Signed)
Attempted to contact cardmaster. Reached out to Dr. Dorothea Ogle regarding ongoing arrhthymias with HR ranging from 80-130 as well as persistent hypotension. He  has history of VT s/p ICD but with wide complex tachycardia--he does feel better but reports it started with pain in throat (similar symptoms) to when he had NSTEMI.  Cards to follow up for input.

## 2021-02-24 NOTE — Progress Notes (Addendum)
Occupational Therapy Discharge Note  This patient was unable to complete the inpatient rehab program due to unexpected DC to acute setting in event of ongoing arrhthymias with HR ranging from 80-130 as well as persistent hypotension. However, pt did meet a majority their long term goals at time of unexpected DC. Pt left the program at an overall S to mod I assist level for their  functional ADL/IADL. This patient is being discharged from OT services at this time.  BIMS at time of d/c  Pt unable to complete due to medical status  See CareTool for functional status details.  If the patient is able to return to inpatient rehabilitation within 3 midnights, this may be considered an interrupted stay and therapy services will resume as ordered. Modification and reinstatement of their goals will be made upon completion of therapy service reevaluations.

## 2021-02-24 NOTE — Progress Notes (Signed)
Occupational Therapy Session Note  Patient Details  Name: John [name] MRN: [id number] Date of Birth: 21-Jun-1950  Today's Date: 02/24/2021 OT Individual Time: 5997-7414 OT Individual Time Calculation (min): 58 min    Short Term Goals: Week 1:  OT Short Term Goal 1 (Week 1): STG = LTG 2/ 2 ELOS  Skilled Therapeutic Interventions/Progress Updates:    Pt received semi-reclined in bed with RT present, denies pain but relates incident of "afib" with variable HR and BP early this am. MD present to confirm to monitor vitals during therapy and progress activity as tolerated. Session focus on self-care retraining, activity tolerance, visual motor skills, LUE NMR in prep for improved ADL/IADL/func mobility performance + decreased caregiver burden.  In supine, BP read at 92/67 (71) , SatO2 at 94%, HR at 69 bpm Seated EOB, BP read at 79/63 (71), HR at 140 but within 2 min recovered to 70 bpm Standing, BP read at 80/71, HR at 60 bpm  Pt remain seated throughout session, being transported in w/c 2/2 HR elevation with activity.  Seated at [facility], completed the following in prep for improved visual tracking/convergence with LUE.  Single Target, 90% accuracy, 2.6 reaction time Rotating Sequence, 76.92% accuracy, 4.99 reaction time Smooth pursuits, 80% accuracy  Reports having to have one eye closed to improved performance.   Issued stroke support group flyer and reviewed Be Fast acronym and importance of being aware of common s/sx 2/2 increase risk for second stroke.   Stand-pivot to and from w/c with distant S, completed toileting tasks mod I, but reports no void. Stand-pivot no AD > bed with S and returned to supine with ind.   In supine, BP read at 90/67 (76), SatO2 at 96%, HR at 69 bpm  Pt left semi-reclined in bed with bed alarm engaged, call bell in reach, and all immediate needs met.    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  Therapy/Group: Individual Therapy  Volanda Napoleon MS, OTR/L  02/24/2021, 6:41 AM

## 2021-02-24 NOTE — Plan of Care (Signed)
  Problem: RH Balance Goal: LTG Patient will maintain dynamic sitting balance (PT) Description: LTG:  Patient will maintain dynamic sitting balance with assistance during mobility activities (PT) Outcome: Completed/Met Goal: LTG Patient will maintain dynamic standing balance (PT) Description: LTG:  Patient will maintain dynamic standing balance with assistance during mobility activities (PT) Outcome: Completed/Met   Problem: Sit to Stand Goal: LTG:  Patient will perform sit to stand with assistance level (PT) Description: LTG:  Patient will perform sit to stand with assistance level (PT) Outcome: Not Met (add Reason) Note: Patient discharged prior to completing program due to heart arrhythmias   Problem: RH Bed Mobility Goal: LTG Patient will perform bed mobility with assist (PT) Description: LTG: Patient will perform bed mobility with assistance, with/without cues (PT). Outcome: Completed/Met   Problem: RH Bed to Chair Transfers Goal: LTG Patient will perform bed/chair transfers w/assist (PT) Description: LTG: Patient will perform bed to chair transfers with assistance (PT). Outcome: Not Met (add Reason) Note: Patient discharged prior to completing program due to heart arrhythmias   Problem: RH Car Transfers Goal: LTG Patient will perform car transfers with assist (PT) Description: LTG: Patient will perform car transfers with assistance (PT). Outcome: Not Met (add Reason) Note: Patient discharged prior to completing program due to heart arrhythmias   Problem: RH Ambulation Goal: LTG Patient will ambulate in controlled environment (PT) Description: LTG: Patient will ambulate in a controlled environment, # of feet with assistance (PT). Outcome: Not Met (add Reason) Note: Patient discharged prior to completing program due to heart arrhythmias Goal: LTG Patient will ambulate in home environment (PT) Description: LTG: Patient will ambulate in home environment, # of feet with  assistance (PT). Outcome: Not Met (add Reason) Note: Patient discharged prior to completing program due to heart arrhythmias Goal: LTG Patient will ambulate in community environment (PT) Description: LTG: Patient will ambulate in community environment, # of feet with assistance (PT). Outcome: Not Met (add Reason) Note: Patient discharged prior to completing program due to heart arrhythmias   Problem: RH Wheelchair Mobility Goal: LTG Patient will propel w/c in controlled environment (PT) Description: LTG: Patient will propel wheelchair in controlled environment, # of feet with assist (PT) Outcome: Completed/Met   Problem: RH Stairs Goal: LTG Patient will ambulate up and down stairs w/assist (PT) Description: LTG: Patient will ambulate up and down # of stairs with assistance (PT) Outcome: Not Met (add Reason) Note: Patient discharged prior to completing program due to heart arrhythmias   Problem: RH Awareness Goal: LTG: Patient will demonstrate awareness during functional activites type of (PT) Description: LTG: Patient will demonstrate awareness during functional activites type of (PT) Outcome: Not Met (add Reason) Note: Patient discharged prior to completing program due to heart arrhythmias  Magda Kiel, PT

## 2021-02-24 NOTE — Discharge Instructions (Addendum)
COMMUNITY REFERRALS UPON DISCHARGE:    Outpatient: PT     OT                Agency: [facility] Phone: [phone]              Appointment Date/Time: TBD  Medical Equipment/Items Ordered: Vassie Moselle                                                 Agency/Supplier: [employer]

## 2021-02-24 NOTE — Progress Notes (Signed)
Patient will be transferred to step down per cardiology recommendation. Waiting for room number.

## 2021-02-24 NOTE — Progress Notes (Signed)
Report given to receiving RN; RN left message to wife; per patient wife is aware of transfer.

## 2021-02-24 NOTE — H&P (Addendum)
Cardiology Admission History and Physical:   Patient ID: John [name] MRN: [id number]; DOB: February 13, 1951   Admission date: 02/18/2021  PCP:  [name], MD   [facility] Providers Cardiologist:  Shelva Majestic, MD  Electrophysiologist:  Constance Haw, MD  {   Chief Complaint:  VT  Patient Profile:   John [name] is a 70 y.o. male with with  hx of  CAD(multiple prior PCIs reported in the RCA and Cx, last stenting 02/15/21), ICM, HTN, HLD, DM, OSA (w/CPAP), TAA, ILD, quit smoking 2019, AFlutter, back surgery resulting in L foot drop, chronic back pain, VT, who is being seen 02/24/2021 for the evaluation of tachycardia.  Device information MDT dual chamber ICD implanted 08/20/20 Secondary prevention w/hx of hemodynamically unstable MMVT   AAD hx 2018 Pulmonary function studies have shown restriction with low DLCO.  He felt possibly also to have asthma based on nitric oxide testing.  With his lung disease it has been recommended that amiodarone be considered for discontinuance Looks like amio was stopped in 2019 Follows with pulmonology March 2022 mexilletine > recurrent VT higher doses were too expensive June 2022 sotalol started Aug 2022 more VT and mexiletine resumed 01/18/21, VT ablation 02/09/21, device interrogation done for MRI noted VT treated with ATP and mexiletine ordered for increase to 300mg  BID >>>> THIS DID NOT GET TRANSLATED ON DISCHARGE AND remained at 16ms    History of Present Illness:   John [name] has had a long year with recurrent VT and medical issues. He was Hospitalized March,  [place], July, Aug 2022 with VT, AAD added and escalated as above.  He was admitted 02/11/21 with L great toe cellulitis and underwent amputation.   Readmitted 02/14/21 with CP, NSTEMI, underwent cath and PCI  (Difficult for successful PTCA/PCI using 3.5 mm ScoreFlex balloon multiple inflations with post dilation of the previously stented) overlapping Onyx Frontier DES 2.75  mm x 22 mm placed overlapping distally); prox RCA ~50%, prox-distal RCA 35%.  Unfortunately suffered a stroke and also found with a lung nodule.   Finally discharged to rehab 02/18/21  TODAY he woke feeling great, though noted some light palpitations.  No other cardiac awareness.  No CP.  He felt unusually weak with rehab, and staff found his HR elevated EKG was done noting [facility] and cardiology called to see.  He was in bed, feeling well.  No cardiac awareness, denies any kind of CP<, and has not had any CP.  No near syncope or syncope BP was 101/66, HR 130's  The patient was seen with dr. Sallyanne Kuster, and via programmer confirmed to be in VT.  His detection rate at 140, noted he had 2 ATPs that were successful overnight last night.   Detection rate was reduced to allow device to run through the ATP cycles, though did not work, given asymptomatic and hemodynamically stable, did not allow HV therapies.  Dr. Sallyanne Kuster was able to break with manual ATP's the lat that worked was 310ms  Not that he was only getting 150mg  BID  He feels well, says his strok symptoms are nearly resolved, vision is almost back to normal and reports that he was going to be released to "room privileges" today, meaning he could get OOB on his own apparently   Past Medical History:  Diagnosis Date   Atrial flutter [facility])    s/p cardioversion   Coronary artery disease    Diabetes mellitus    GERD (gastroesophageal reflux disease)  History of nuclear stress test 04/04/2011   lexiscan; mod-large in size fixed inferolateral defect (scar); non-diagnostic for ischemia; low risk scan    Hyperlipidemia    Hypertension    Left foot drop    r/t past disk srugery - uses Kevlar brace   Myocardial infarction [facility])    posterior MI   Shortness of breath    Sleep apnea    on CPAP; 04/28/2007 split-night - AHI during total sleep 44.43/hr and REM 72.56/hr    Past Surgical History:  Procedure Laterality Date   AMPUTATION TOE Left  02/10/2021   Procedure: AMPUTATION  LEFT SECOND TOE;  Surgeon: Edrick Kins, DPM;  Location: [place];  Service: Podiatry;  Laterality: Left;   [place]  2010   6 stents total   CARDIAC CATHETERIZATION  01/2000   percutaneous transluminal coronary balloon angioplasty of mid RCA stenotic lesion   CARDIAC CATHETERIZATION  06/2006   no stenting; ischemic cardiomyopathy, EF 40-45%   CARDIOVERSION N/A 07/28/2016   Procedure: CARDIOVERSION;  Surgeon: Troy Sine, MD;  Location: [place];  Service: Cardiovascular;  Laterality: N/A;   CORONARY ANGIOPLASTY  09/1998   mid-distal RCA balloon dilatation, 4.5 & 5.0 stents    CORONARY ANGIOPLASTY WITH STENT PLACEMENT  03/1994   angioplasty & stenting (non-DES) of circumflex/prox ramus intermedius   CORONARY ANGIOPLASTY WITH STENT PLACEMENT  10/1994   large iliac [id number] stent to RCA   [place]  12/2002   4.4mm stents x2 of RCA   CORONARY ANGIOPLASTY WITH STENT PLACEMENT  01/2005   cutting balloon arthrectomy of distal RCA & Cypher DES 3.5x13; cutting balloon arthrectomy of mid RCA with Cypher DES 3.5x18   CORONARY ANGIOPLASTY WITH STENT PLACEMENT  11/2008   stenting of mid RCA with 4.0x32mm driver, non-DES   CORONARY BALLOON ANGIOPLASTY N/A 02/15/2021   Procedure: CORONARY BALLOON ANGIOPLASTY;  Surgeon: Troy Sine, MD;  Location: [place] CV LAB;  Service: Cardiovascular;  Laterality: N/A;   ICD IMPLANT N/A 08/20/2020   Procedure: ICD IMPLANT;  Surgeon: Constance Haw, MD;  Location: [place] CV LAB;  Service: Cardiovascular;  Laterality: N/A;   INTRAVASCULAR PRESSURE WIRE/FFR STUDY N/A 03/02/2020   Procedure: INTRAVASCULAR PRESSURE WIRE/FFR STUDY;  Surgeon: Leonie Man, MD;  Location: [place] CV LAB;  Service: Cardiovascular;  Laterality: N/A;   LEFT HEART CATH AND CORONARY ANGIOGRAPHY N/A 03/02/2020   Procedure: LEFT HEART CATH AND CORONARY ANGIOGRAPHY;  Surgeon:  Leonie Man, MD;  Location: [place] CV LAB;  Service: Cardiovascular;  Laterality: N/A;   LEFT HEART CATH AND CORONARY ANGIOGRAPHY N/A 08/19/2020   Procedure: LEFT HEART CATH AND CORONARY ANGIOGRAPHY;  Surgeon: Lorretta Harp, MD;  Location: [place] CV LAB;  Service: Cardiovascular;  Laterality: N/A;   LEFT HEART CATH AND CORONARY ANGIOGRAPHY N/A 02/15/2021   Procedure: LEFT HEART CATH AND CORONARY ANGIOGRAPHY;  Surgeon: Troy Sine, MD;  Location: [place] CV LAB;  Service: Cardiovascular;  Laterality: N/A;   LEFT HEART CATHETERIZATION WITH CORONARY ANGIOGRAM N/A 02/27/2012   Procedure: LEFT HEART CATHETERIZATION WITH CORONARY ANGIOGRAM;  Surgeon: Lorretta Harp, MD;  Location: [facility] CATH LAB;  Service: Cardiovascular;  Laterality: N/A;   TRANSTHORACIC ECHOCARDIOGRAM  07/29/2010   EF 50=55%, mod inf wall hypokinesis & mild post wall hypokinesis; LA mild-mod dilated; mild mitral annular calcif & mild MR; mild TR & elevated RV systolic pressure; AV mildly sclerotic; mild aortic root  dilatation    V TACH ABLATION N/A 01/20/2021   Procedure: V TACH ABLATION;  Surgeon: Vickie Epley, MD;  Location: [place] CV LAB;  Service: Cardiovascular;  Laterality: N/A;     Medications Prior to Admission: Prior to Admission medications   Medication Sig Start Date End Date Taking? Authorizing Provider  acetaminophen (TYLENOL) 500 MG tablet Take 500 mg by mouth every 6 (six) hours as needed for moderate pain.    [provider]  albuterol (VENTOLIN HFA) 108 (90 Base) MCG/ACT inhaler Inhale 2 puffs into the lungs every 6 (six) hours as needed for wheezing or shortness of breath. 06/14/20   Garner Nash, DO  aspirin EC 81 MG EC tablet Take 1 tablet (81 mg total) by mouth daily. Swallow whole. 02/19/21   Ledora Bottcher, PA  clopidogrel (PLAVIX) 75 MG tablet Take 1 tablet (75 mg total) by mouth daily with breakfast. 02/19/21   Duke, Tami Lin, PA  ELIQUIS 5 MG TABS tablet TAKE  1 TABLET BY MOUTH TWICE A DAY Patient taking differently: Take 5 mg by mouth 2 (two) times daily. 10/13/20   Camnitz, Will Hassell Done, MD  ENTRESTO 24-26 MG TAKE 1 TABLET BY MOUTH TWICE A DAY Patient taking differently: Take 1 tablet by mouth 2 (two) times daily. 10/08/20   Troy Sine, MD  Fluticasone Furoate (ARNUITY ELLIPTA) 200 MCG/ACT AEPB Inhale 1 puff into the lungs daily. 12/29/20   Icard, Leory Plowman L, DO  glucose blood (ONETOUCH ULTRA) test strip USE TO CHECK BLOOD SUGAR 3 TIMES A DAY (E11.9) 12/09/20   [name], MD  HYDROcodone-acetaminophen (NORCO/VICODIN) 5-325 MG tablet Take 1 tablet by mouth every 6 (six) hours as needed for severe pain. Patient not taking: No sig reported 02/11/21   Arrien, Jimmy Picket, MD  insulin aspart (NOVOLOG FLEXPEN) 100 UNIT/ML FlexPen INJECT 5-20 UNITS SUBCUTANEOUSLY WITH LUNCH AND DINNER Patient taking differently: Inject 6-8 Units into the skin daily as needed for high blood sugar. 06/16/20   [name], MD  Insulin Pen Needle (NOVOFINE) 32G X 6 MM MISC 1 each by Other route daily. 08/19/19   [name], MD  JARDIANCE 25 MG TABS tablet TAKE 1 TABLET BY MOUTH EVERY DAY Patient taking differently: Take 25 mg by mouth daily. 11/09/20   [name], MD  ketoconazole (NIZORAL) 2 % shampoo Apply 1 application topically 2 (two) times a week. 02/09/21   [provider]  LEVEMIR FLEXTOUCH 100 UNIT/ML FlexPen INJECT 46 UNITS INTO THE SKIN DAILY. DX:E11.9 Patient taking differently: Inject 46 Units into the skin daily. 01/11/21   [name], MD  metoprolol succinate (TOPROL-XL) 100 MG 24 hr tablet Take 1 tablet (100 mg total) by mouth daily. Take with or immediately following a meal. Patient taking differently: Take 100 mg by mouth daily. 11/26/20   Camnitz, Ocie Doyne, MD  mexiletine (MEXITIL) 150 MG capsule Take 2 capsules (300 mg total) by mouth every 12 (twelve) hours. Patient taking differently: Take 150 mg by mouth 2 (two)  times daily. 02/09/21   Baldwin Jamaica, PA-C  montelukast (SINGULAIR) 10 MG tablet TAKE 1 TABLET BY MOUTH EVERY DAY Patient taking differently: Take 10 mg by mouth daily. 08/19/20   [name], MD  nitroGLYCERIN (NITROSTAT) 0.4 MG SL tablet PLACE 1 TABLET (0.4 MG TOTAL) UNDER THE TONGUE EVERY 5 (FIVE) MINUTES AS NEEDED FOR CHEST PAIN. 07/27/20   Troy Sine, MD  rosuvastatin (CRESTOR) 40 MG tablet Take 1  tablet (40 mg total) by mouth daily. 02/18/21   Duke, Tami Lin, PA  sotalol (BETAPACE) 160 MG tablet Take 1 tablet (160 mg total) by mouth 2 (two) times daily. 01/28/21   Baldwin Jamaica, PA-C     Allergies:    Allergies  Allergen Reactions   Omega-3 Fatty Acids Hives and Itching   Benazepril Other (See Comments)    hyperkalemia   Fish Allergy Itching    Social History:   Social History   Socioeconomic History   Marital status: Married    Spouse name: Not on file   Number of children: 3   Years of education: Not on file   Highest education level: Not on file  Occupational History   Occupation: Best boy: [employer]    Comment: [place], [place]. [place]  Tobacco Use   Smoking status: Former    Packs/day: 1.00    Years: 50.00    Pack years: 50.00    Types: Cigarettes    Quit date: 07/20/2016    Years since quitting: 4.6   Smokeless tobacco: Never  Vaping Use   Vaping Use: Never used  Substance and Sexual Activity   Alcohol use: Not Currently    Alcohol/week: 0.0 standard drinks   Drug use: No   Sexual activity: Yes  Other Topics Concern   Not on file  Social History Narrative   Not on file   Social Determinants of Health   Financial Resource Strain: Not on file  Food Insecurity: Not on file  Transportation Needs: Not on file  Physical Activity: Not on file  Stress: Not on file  Social Connections: Not on file  Intimate Partner Violence: Not on file    Family History:   The patient's family history includes Heart attack in his father.     ROS:  Please see the history of present illness.  All other ROS reviewed and negative.     Physical Exam/Data:   Vitals:   02/24/21 0954 02/24/21 0958 02/24/21 1115 02/24/21 1156  BP: (!) 83/60 (!) 80/60 101/66 97/72  Pulse: (!) 53 83 68 69  Resp: 17  17 18   Temp: 98.1 F (36.7 C) 98.1 F (36.7 C) 98.1 F (36.7 C) 97.6 F (36.4 C)  TempSrc: Oral Oral Oral Oral  SpO2: 95% 95% 97% 97%  Weight:      Height:        Intake/Output Summary (Last 24 hours) at 02/24/2021 1159 Last data filed at 02/24/2021 0700 Gross per 24 hour  Intake 336 ml  Output --  Net 336 ml   Last 3 Weights 02/18/2021 02/18/2021 02/18/2021  Weight (lbs) 208 lb 5.4 oz 208 lb 5.4 oz 207 lb 11.2 oz  Weight (kg) 94.5 kg 94.5 kg 94.212 kg     Body mass index is 28.26 kg/m.  General:  Well nourished, well developed, in no acute distress HEENT: normal Lymph: no adenopathy Neck: no JVD Endocrine:  No thryomegaly Vascular: No carotid bruits; FA pulses 2+ bilaterally without bruits  Cardiac:  RRR; tachycardic, no murmurs gallops or rubs Lungs:  clear to auscultation bilaterally, no wheezing, rhonchi or rales  Abd: soft, nontender, no hepatomegaly  Ext: no edema Musculoskeletal:  No deformities, BUE and BLE strength normal and equal Skin: warm and dry  Neuro:  CNs 2-12 intact, no focal abnormalities noted Psych:  Normal affect    EKG:  The ECG that was done today was personally reviewed and demonstrates  VT  138bpm VT 139bpm  Relevant CV Studies:   02/15/21: LHC/PCI   1st Mrg lesion is 55% stenosed.   Prox RCA lesion is 50% stenosed.   Prox RCA to Dist RCA lesion is 35% stenosed.   RPAV-1 lesion is 100% stenosed.   RPAV-2 lesion is 80% stenosed.   A stent was successfully placed.   Post intervention, there is a 0% residual stenosis.   Post intervention, there is a 0% residual stenosis.   Acute coronary syndrome secondary to total occlusion of a large distal right coronary artery at a site of prior  distal stenting.   The LAD has mild irregularity without significant disease.  The large ramus intermediate vessel has previously noted 55% very proximal stenosis not significantly changed.  The AV groove circumflex is a small vessel.   The RCA is a very large vessel with diffuse stents proximally to very distally.  There is proximal 50% stenosis prior to the proximal to mid stent.  There is diffuse 35% narrowing within the long proximal stent.  The distal stent is totally occluded just after PDA vessel.   Difficult but successful PCI requiring PTCA, a 3.5 x 15 mm Scorflex multiple dilatations throughout the previously placed tandem 3.5 mm  distal stents with ultimate noncompliant balloon dilatation to approximately 3.6 mm and insertion of a 2.75 x 22 mm Onyx Frontier new stent distal to the distal stent placed in overlap 75 to 80% stenosis beyond the stented segment, postdilated with a 3 oh balloon with residual narrowing of 0%.   LVEDP 18 mmHg   RECOMMENDATION: Initial triple drug therapy with plan discontinuance of aspirin and continuation of Plavix/for minimum of 6 to 12 months.  Near the completion of the procedure, the patient appeared slightly less responsive.  Blood sugar was obtained which was 63.  He was given one half amp of D50.  After the procedure, a subtle left facial droop developed and there appeared to be  left arm weakness..  A code stroke was activated and the patient was seen immediately by neurology and sent for head CT for further evaluation.   02/15/21: TTE IMPRESSIONS   1. Left ventricular ejection fraction, by estimation, is 40 to 45%. The  left ventricle has mildly decreased function. The left ventricle  demonstrates regional wall motion abnormalities (see scoring  diagram/findings for description). The left ventricular   internal cavity size was moderately dilated. Left ventricular diastolic  parameters are consistent with Grade I diastolic dysfunction (impaired   relaxation).   2. Right ventricular systolic function is normal. The right ventricular  size is normal. There is normal pulmonary artery systolic pressure.   3. Left atrial size was mildly dilated.   4. The mitral valve is normal in structure. Trivial mitral valve  regurgitation. No evidence of mitral stenosis.   5. The aortic valve is normal in structure. Aortic valve regurgitation is  not visualized.   6. Aortic dilatation noted. There is borderline dilatation of the aortic  root, measuring 38 mm.   Comparison(s): Prior images reviewed side by side. The left ventricular  function is unchanged. The left ventricular wall motion abnormalities are  worse, although the general distribution is similar. There seems to be  more some worsening of contractility  in the more distal portion of the inferior and inferoseptal walls, with  sparing of the true apex and without meaningful change in overall LVEF.   FINDINGS   Laboratory Data:  High Sensitivity Troponin:   Recent Labs  Lab  01/26/21 1235 01/26/21 1421 02/14/21 1727 02/14/21 1927 02/14/21 2237  TROPONINIHS 132* 124* 3,076* 2,458* 2,923*      Chemistry Recent Labs  Lab 02/19/21 0507 02/21/21 0547  NA 142 144  K 3.8 3.5  CL 110 110  CO2 25 29  GLUCOSE 126* 139*  BUN 14 13  CREATININE 1.01 1.08  CALCIUM 8.7* 8.7*  GFRNONAA >60 >60  ANIONGAP 7 5    Recent Labs  Lab 02/19/21 0507  PROT 5.4*  ALBUMIN 2.8*  AST 16  ALT 17  ALKPHOS 69  BILITOT 0.6   Hematology Recent Labs  Lab 02/21/21 0547 02/24/21 1058  WBC 7.4 7.3  RBC 5.44 5.34  HGB 16.2 16.2  HCT 50.9 49.5  MCV 93.6 92.7  MCH 29.8 30.3  MCHC 31.8 32.7  RDW 13.7 13.8  PLT 250 262   BNPNo results for input(s): BNP, PROBNP in the last 168 hours.  DDimer No results for input(s): DDIMER in the last 168 hours.   Radiology/Studies:  No results found.   Assessment and Plan:   VT Essentially asymptomatic Broke with manual ATPS by Dr.  Sallyanne Kuster Admit to stepdown bed Increase his mexiletine No changes were made to his therapy programming though monitor zone was reduced to 120bpm   2. AFlutter On Eliquis  3. CAD To continue total 30 days of triple therapy Continue meds  4. Lung nodule Outpatient follow up   5. ICM Appears compensated Continue meds  6. DM Continue meds  7. Recent stroke Steady improvement reported    Risk Assessment/Risk Scores:    For questions or updates, please contact [place] Please consult [email or web address] for contact info under     Signed, Baldwin Jamaica, PA-C  02/24/2021 11:59 AM     I have seen and examined the patient along with Baldwin Jamaica, PA-C .  I have reviewed the chart, notes and new data.  I agree with PA/NP's note.  Key new complaints: Minimal complaints of vague chest tightness and occasional palpitations while at rest.  Denies angina. Key examination changes: Regular tachycardia, clear lungs, no murmurs or gallops.  Mild swelling of left foot at site of previous toe amputation, otherwise no edema.  No JVD. Key new findings / data: Comprehensive ICD interrogation today.  Normal device function.  He is in a regular monomorphic ventricular tachycardia with a cycle length of about 430 ms (137 bpm, just underneath the programmed VT therapy zone of 140 bpm).  ECG shows ventricular tachycardia with prominent QS pattern in the inferior leads and a precordial transition in lead V2, suggesting inferior scar VT, rare supraventricular capture beats.  Most recent labs did not show major electrolyte abnormalities.  Reviewed recent cardiac catheterization and PCI procedure from 02/14/2021.  History of previous VT ablation.    PLAN: Initially tried the decreasing DVT detection zone to 130 bpm to allow programmed ATP to terminate the arrhythmia.  However the 8 cycles of burst overdrive pacing (4 at 95% of cycle length, 4 at 81% of cycle length) and subsequent cycles of ramp  overdrive pacing were unsuccessful at terminating the arrhythmia.  Device therapy was suspended before shock was delivered.  Manual overdrive pacing was then performed with burst pacing at gradually decreasing cycle lengths.  At burst pacing at 330 ms and 310 ms led to temporary interruption of the arrhythmia for 1 beat with resumption of the VT.  Overdrive pacing with a cycle length of 300 ms led to termination of the arrhythmia,  converting to SR w occasional PVCs.  The patient tolerated the procedure well, without any adverse symptoms.  Plan to transfer back to the acute care setting and telemetry.  Increase the mexiletine to 300 mg twice daily as had been initially planned.  Suspicion for an acute coronary syndrome is very low.  Suspect inferior wall scar-related VT.   Sanda Klein, MD, [place] [phone] 02/24/2021, 12:50 PM

## 2021-02-24 NOTE — Progress Notes (Signed)
   02/24/21 0958  Assess: MEWS Score  Temp 98.1 F (36.7 C)  BP (!) 80/60 (manual)  Pulse Rate 83  SpO2 95 %  O2 Device Room Air  Assess: MEWS Score  MEWS Temp 0  MEWS Systolic 2  MEWS Pulse 0  MEWS RR 0  MEWS LOC 0  MEWS Score 2  MEWS Score Color Yellow  Assess: if the MEWS score is Yellow or Red  Were vital signs taken at a resting state? Yes  Focused Assessment Change from prior assessment (see assessment flowsheet)  Early Detection of Sepsis Score *See Row Information* Low  MEWS guidelines implemented *See Row Information* Yes  Treat  Pain Scale 0-10  Pain Score 0  Take Vital Signs  Increase Vital Sign Frequency  Yellow: Q 2hr X 2 then Q 4hr X 2, if remains yellow, continue Q 4hrs  Escalate  MEWS: Escalate Yellow: discuss with charge nurse/RN and consider discussing with provider and RRT  Notify: Charge Nurse/RN  Name of Charge Nurse/RN Notified Santiago Glad RN  Date Charge Nurse/RN Notified 02/24/21  Time Charge Nurse/RN Notified 1031  Notify: Provider  Provider Name/Title Pam PA  Date Provider Notified 02/24/21  Time Provider Notified 1015  Notification Type Face-to-face  Notification Reason Change in status  Provider response No new orders  Date of Provider Response 02/24/21  Time of Provider Response 1150  Document  Patient Outcome Other (Comment) (PAtient stayed in unit; EKG ordered; due meds given see new orders; per PA she will notify cardiologist)  Progress note created (see row info) Yes

## 2021-02-25 ENCOUNTER — Telehealth: Payer: Self-pay | Admitting: Podiatry

## 2021-02-25 DIAGNOSIS — I472 Ventricular tachycardia: Principal | ICD-10-CM

## 2021-02-25 DIAGNOSIS — Z4889 Encounter for other specified surgical aftercare: Secondary | ICD-10-CM

## 2021-02-25 LAB — BASIC METABOLIC PANEL
Anion gap: 10 (ref 5–15)
BUN: 18 mg/dL (ref 8–23)
CO2: 24 mmol/L (ref 22–32)
Calcium: 8.8 mg/dL — ABNORMAL LOW (ref 8.9–10.3)
Chloride: 107 mmol/L (ref 98–111)
Creatinine, Ser: 1.05 mg/dL (ref 0.61–1.24)
GFR, Estimated: >60 mL/min (ref >60)
Glucose, Bld: 111 mg/dL — ABNORMAL HIGH (ref 70–99)
Potassium: 3.6 mmol/L (ref 3.5–5.1)
Sodium: 141 mmol/L (ref 135–145)

## 2021-02-25 LAB — GLUCOSE, CAPILLARY
Glucose-Capillary: 110 mg/dL — ABNORMAL HIGH (ref 70–99)
Glucose-Capillary: 200 mg/dL — ABNORMAL HIGH (ref 70–99)
Glucose-Capillary: 80 mg/dL (ref 70–99)
Glucose-Capillary: 201 mg/dL — ABNORMAL HIGH (ref 70–99)

## 2021-02-25 MED ORDER — POTASSIUM CHLORIDE CRYS ER 20 MEQ PO TBCR
40.0000 meq | EXTENDED_RELEASE_TABLET | Freq: Once | ORAL | Status: AC
  Administered 2021-02-25: 40 meq via ORAL
  Filled 2021-02-25: qty 2

## 2021-02-25 NOTE — Evaluation (Signed)
Occupational Therapy Evaluation Patient Details Name: John [name] MRN: [id number] DOB: [birth year] Today's Date: 02/25/2021    History of Present Illness Pt transferred back to acute side of hospital from [place] on 02/24/21 due to EKG showing VT. Initially, pt presented to the ED 8/29 with chest pain. Pt with NSTEMI. Pt underwent cardiac cath 8/30 and post procedure developed lt sided weakness, lt neglect, and rt gaze preference. MRI: Acute infarct right thalamus. Small punctate areas of acute infarct  in the frontal lobes bilaterally and right parietal white matter.  Findings suggest cerebral emboli. PMH - CAD, multiple cardiac stents, HTN, OSA, aflutter, vtach, ICD, VT ablation 01/17/81, chronic systolic HF, lt second toe amputation 8/2 with no weight bearing restrictions, back surgery with resultant lt foot drop.   Clinical Impression   Pt walks with a walking stick at baseline and functions independently. He had met all but his housekeeping goal in CIR prior to discharging back to acute side of hospital. Pt presents with mild impulsivity, impaired balance and intermittent blurring of vision. He is overall functioning at a supervision level and will return home with his wife. Recommending OPOT. Pt likely to discharge home tomorrow, will defer further OT to OP.     Follow Up Recommendations  Outpatient OT    Equipment Recommendations  None recommended by OT    Recommendations for Other Services       Precautions / Restrictions Precautions Precautions: Fall Precaution Comments: L foot drop at baseline Restrictions Weight Bearing Restrictions: No      Mobility Bed Mobility Overal bed mobility: Modified Independent             General bed mobility comments: HOB up slightly    Transfers Overall transfer level: Needs assistance Equipment used: Rolling walker (2 wheeled) Transfers: Sit to/from Stand Sit to Stand: Supervision         General transfer comment: supervision for  safety    Balance Overall balance assessment: Needs assistance Sitting-balance support: Feet supported;No upper extremity supported Sitting balance-Leahy Scale: Good     Standing balance support: Bilateral upper extremity supported;No upper extremity supported;Single extremity supported;During functional activity Standing balance-Leahy Scale: Fair Standing balance comment: fair statically                           ADL either performed or assessed with clinical judgement   ADL Overall ADL's : Needs assistance/impaired Eating/Feeding: Independent;Sitting                                           Vision Baseline Vision/History: 1 Wears glasses Patient Visual Report: Blurring of vision (intermittent)       Perception     Praxis      Pertinent Vitals/Pain Pain Assessment: No/denies pain Faces Pain Scale: No hurt Pain Intervention(s): Monitored during session     Hand Dominance Right   Extremity/Trunk Assessment Upper Extremity Assessment Upper Extremity Assessment: Overall WFL for tasks assessed   Lower Extremity Assessment Lower Extremity Assessment: Defer to PT evaluation LLE Deficits / Details: ankle 0/5 from prior back surgery, otherwise 4+ to 5 grossly compared to 5/5 on R grossly; denied numbness/tingling   Cervical / Trunk Assessment Cervical / Trunk Assessment: Normal   Communication Communication Communication: No difficulties   Cognition Arousal/Alertness: Awake/alert Behavior During Therapy: Impulsive (mildly) Overall Cognitive Status: Impaired/Different from baseline  Area of Impairment: Safety/judgement;Awareness                         Safety/Judgement: Decreased awareness of safety Awareness: Anticipatory   General Comments: pt with strong desire to demonstrate independence, he is aware he is unsafe to drive and will need to wait until MD clears   General Comments       Exercises     Shoulder  Instructions      Home Living Family/patient expects to be discharged to:: Private residence Living Arrangements: Spouse/significant other Available Help at Discharge: Family;Available 24 hours/day Type of Home: House Home Access: Stairs to enter;Ramped entrance Entrance Stairs-Number of Steps: 4 STE in the front, home made ramp in the back for the dogs primarily, but he did go up and down.  R rail. Not ADA compliant. Entrance Stairs-Rails: Left Home Layout: One level     Bathroom Shower/Tub: Occupational psychologist: Standard Bathroom Accessibility: Yes How Accessible: Accessible via walker Home Equipment: Shower seat;Grab bars - tub/shower;Hand held shower head;Other (comment)   Additional Comments: spouse has a wheelchair      Prior Functioning/Environment Level of Independence: Independent        Comments: owns a home made walking stick        OT Problem List: Decreased cognition;Impaired balance (sitting and/or standing)      OT Treatment/Interventions:      OT Goals(Current goals can be found in the care plan section) Acute Rehab OT Goals Patient Stated Goal: go home tomorrow  OT Frequency:     Barriers to D/C:            Co-evaluation              AM-PAC OT "6 Clicks" Daily Activity     Outcome Measure Help from another person eating meals?: None Help from another person taking care of personal grooming?: A Little Help from another person toileting, which includes using toliet, bedpan, or urinal?: A Little Help from another person bathing (including washing, rinsing, drying)?: A Little Help from another person to put on and taking off regular upper body clothing?: None Help from another person to put on and taking off regular lower body clothing?: A Little 6 Click Score: 20   End of Session Equipment Utilized During Treatment: Gait belt;Rolling walker  Activity Tolerance: Patient tolerated treatment well Patient left: in bed;with call  bell/phone within reach;with nursing/sitter in room  OT Visit Diagnosis: Other symptoms and signs involving cognitive function;Other abnormalities of gait and mobility (R26.89)                Time: 1420-1435 OT Time Calculation (min): 15 min Charges:  OT General Charges $OT Visit: 1 Visit OT Evaluation $OT Eval Moderate Complexity: 1 Mod  Nestor Lewandowsky, OTR/L Acute Rehabilitation Services Pager: [phone] Office: [phone]  Malka So 02/25/2021, 2:47 PM

## 2021-02-25 NOTE — Consult Note (Signed)
Podiatry Re-Consult note  To: Dr. Sanda Klein, MD and  Tyson Babinski, [place]  From: Dr. Landis Martins, DPM   Re: Surgical wound check  Subjective: John [name] is a 70 y.o. male patient seen at bedside, resting comfortably in no acute distress s/p left second toe amputation performed on 02/10/2021 by Dr. Amalia Hailey.  Patient denies pain at surgical site, denies calf pain, denies headache, chest pain, shortness of breath, nausea, vomitting, denies loss of appetite, denies problems with voiding.  Reports that nursing has been helping to change his dressings while he has been inpatient.  Patient reports that he is scheduled to be discharged on tomorrow.  No other issues noted.   Patient Active Problem List   Diagnosis Date Noted   Embolic stroke ([place]) 93/57/0177   ACS (acute coronary syndrome) ([place])    Left-sided neglect    Acute focal neurological deficit    NSTEMI (non-ST elevated myocardial infarction) ([place]) 02/14/2021   Diabetic infection of left foot ([place]) 93/90/3009   Chronic systolic CHF (congestive heart failure) ([place]) 02/08/2021   History of ventricular tachycardia 02/08/2021   VT (ventricular tachycardia) ([place]) 01/26/2021   Coagulation defect ([place]) 12/22/2020   Non-ST elevation (NSTEMI) myocardial infarction [facility])    Ventricular tachycardia ([place]) 08/18/2020   Capsulitis 05/21/2020   Elevated troponin    Hyperglycemia due to diabetes mellitus ([place]) 02/29/2020   Thoracic aortic aneurysm ([place]) 02/29/2020   Hypotensive episode 02/29/2020   Obesity (BMI 30.0-34.9) 02/29/2020   Lung nodule 02/19/2020   Former smoker 02/19/2020   Healthcare maintenance 02/19/2020   Acoustic neuroma ([place]) 09/11/2017   Asymmetrical sensorineural hearing loss 09/11/2017   Encounter for cardioversion procedure    Anticoagulation adequate 06/06/2016   Fatigue 10/27/2015   Bradycardia 10/27/2015   Hyperlipidemia LDL goal <70 05/20/2015   OSA on CPAP 07/23/2013   Excessive daytime sleepiness  07/23/2013   Hypertension    Hyperlipidemia    Diabetes mellitus    Chest pain 08/18/2012   Tobacco abuse 08/18/2012   Unstable angina, cath [facility] 02/27/12 02/26/2012   CAD, multiple prior RCA PCI's. Last cath 2010, Myoview low risk Oct 2012 02/26/2012   DM type 2, goal HbA1c < 7% (HCC) 02/26/2012   HTN (hypertension) 02/26/2012   Dyslipidemia, low HDL. Intol to fish oil and Noacin 02/26/2012   Sleep apnea, on C-pap 02/26/2012   COPD (chronic obstructive pulmonary disease) ([place]) 02/26/2012   Smoking, quit one week ago 02/26/2012   GERD (gastroesophageal reflux disease) 02/26/2012     Current Facility-Administered Medications:    0.9 %  sodium chloride infusion, 250 mL, Intravenous, PRN, Baldwin [place], PA-C   acetaminophen (TYLENOL) tablet 650 mg, 650 mg, Oral, Q4H PRN, Baldwin [place], PA-C   albuterol (PROVENTIL) (2.5 MG/3ML) 0.083% nebulizer solution 3 mL, 3 mL, Inhalation, Q6H PRN, Baldwin [place], PA-C   apixaban [name]) tablet 5 mg, 5 mg, Oral, BID, Baldwin Jamaica, PA-C, 5 mg at 02/25/21 2330   aspirin EC tablet 81 mg, 81 mg, Oral, Daily, Baldwin [place], PA-C, 81 mg at 02/25/21 0912   budesonide (PULMICORT) nebulizer solution 0.5 mg, 2 mL, Inhalation, BID, Baldwin [place], PA-C, 0.5 mg at 02/25/21 0754   clopidogrel (PLAVIX) tablet 75 mg, 75 mg, Oral, Q breakfast, Baldwin Jamaica, PA-C, 75 mg at 02/25/21 0912   empagliflozin (JARDIANCE) tablet 25 mg, 25 mg, Oral, Daily, Baldwin [place], PA-C, 25 mg at 02/25/21 0913   HYDROcodone-acetaminophen (NORCO/VICODIN) 5-325 MG per tablet 1 tablet, 1  tablet, Oral, Q6H PRN, Baldwin [place], PA-C   hydrocortisone cream 1 %, , Topical, BID, Baldwin [place], [place], Given at 02/25/21 4098   insulin aspart (novoLOG) injection 0-15 Units, 0-15 Units, Subcutaneous, TID WC, Baldwin [place], [place], 3 Units at 02/25/21 1121   metoprolol succinate (TOPROL-XL) 24 hr tablet 100 mg, 100 mg, Oral, Daily, Baldwin [place], PA-C, 100 mg  at 02/25/21 1191   mexiletine (MEXITIL) capsule 300 mg, 300 mg, Oral, Q12H, Baldwin [place], PA-C, 300 mg at 02/25/21 0912   montelukast (SINGULAIR) tablet 10 mg, 10 mg, Oral, Daily, Baldwin [place], PA-C, 10 mg at 02/25/21 0913   nitroGLYCERIN (NITROSTAT) SL tablet 0.4 mg, 0.4 mg, Sublingual, Q5 Min x 3 PRN, [place], Renee Lynn, PA-C   polyethylene glycol (MIRALAX / GLYCOLAX) packet 17 g, 17 g, Oral, Daily PRN, Baldwin Jamaica, PA-C   rosuvastatin (CRESTOR) tablet 40 mg, 40 mg, Oral, Daily, Baldwin Jamaica, PA-C, 40 mg at 02/25/21 0913   sodium chloride flush (NS) 0.9 % injection 3 mL, 3 mL, Intravenous, Q12H, Baldwin [place], PA-C, 3 mL at 02/25/21 0915   sodium chloride flush (NS) 0.9 % injection 3 mL, 3 mL, Intravenous, PRN, Baldwin [place], PA-C   sotalol (BETAPACE) tablet 160 mg, 160 mg, Oral, BID, Baldwin Jamaica, PA-C, 160 mg at 02/25/21 0913   traZODone (DESYREL) tablet 25-50 mg, 25-50 mg, Oral, QHS PRN, Baldwin [place], PA-C  Allergies  Allergen Reactions   Omega-3 Fatty Acids Hives and Itching   Benazepril Other (See Comments)    hyperkalemia   Fish Allergy Itching     Objective: Today's Vitals   02/25/21 0910 02/25/21 0930 02/25/21 1104 02/25/21 1650  BP:  112/82 109/70   Pulse:  69 70   Resp:   16   Temp:   97.8 F (36.6 C)   TempSrc:   Oral   SpO2:  97% 96%   Weight:      Height:      PainSc: 0-No pain   0-No pain    General: No acute distress  Left Lower extremity: Dressing to left foot clean, dry, intact. No strikethrough noted, Upon removal of dressings sutures intact with no dehiscence.  Minimal erythema, mild edema, no active bloody drainage. No other acute signs of infection. No calf pain. Range of motion excluding surgical site within normal limits with no pain or crepitation.  Status post second toe amputation.      Assessment and Plan:  Status post left second toe amputation secondary to osteomyelitis   -Patient seen and evaluated at  bedside -Dressing change performed; applied Betadine and dry dressing to the area -Surgical site healing well no acute indication at this time to remove the stitches -Advised patient to make sure to keep dressing clean, dry, and intact to Left foot  -Patient may continue weightbearing with surgical shoe as tolerated -Continue with rest and elevation to assist with pain and edema control  -Patient is stable from podiatry point of view for discharge to home tomorrow.  Patient will follow-up in office.  Office will contact patient to arrange follow-up appointment to have sutures removed outpatient  Dr. Landis Martins, DPM  [facility] [phone] office [phone] cell Available via secure chat

## 2021-02-25 NOTE — Discharge Summary (Signed)
Physician Discharge Summary  Patient ID: RYSHAWN SANZONE MRN: [id number] DOB/AGE: [birth year] 72 y.o.  Admit date: 02/18/2021 Discharge date: 02/24/2021  Discharge Diagnoses:  Principal Problem:   Embolic stroke [facility]) Active Problems:   DM type 2, goal HbA1c < 7% (HCC)   COPD (chronic obstructive pulmonary disease) (HCC)   OSA on CPAP   Lung nodule   Hypotensive episode   Ventricular tachycardia [facility])   Discharged Condition: serious  Significant Diagnostic Studies: N/A   Labs:  Basic Metabolic Panel: Recent Labs  Lab 02/19/21 0507 02/21/21 0547 02/24/21 1058 02/25/21 0330  NA 142 144 140 141  K 3.8 3.5 4.1 3.6  CL 110 110 109 107  CO2 25 29 24 24   GLUCOSE 126* 139* 162* 111*  BUN 14 13 17 18   CREATININE 1.01 1.08 1.31* 1.05  CALCIUM 8.7* 8.7* 8.7* 8.8*    CBC: Recent Labs  Lab 02/19/21 0507 02/21/21 0547 02/24/21 1058  WBC 6.1 7.4 7.3  NEUTROABS 3.6  --   --   HGB 16.3 16.2 16.2  HCT 50.1 50.9 49.5  MCV 92.3 93.6 92.7  PLT 210 250 262    CBG: Recent Labs  Lab 02/24/21 1608 02/24/21 2108 02/25/21 0612 02/25/21 1106 02/25/21 1603  GLUCAP 138* 130* 110* 200* 80    Brief HPI:   DEMETRI GOSHERT is a 70 y.o. male with history of HTN, T2DM, lumbar surgery with left foot drop, PAF on Eliquis, V. tach s/p ICD with recent cardioversion, CAD s/p PCI, infected left second toe s/p amputation 8/23 who was admitted on 02/14/2021 with acute onset of chest pain radiating to left arm.  He was found to have NSTEMI and was taken to Cath Lab on 08/30 for PTCA/PCI of in-stent thrombosis with occlusion of proximal PL.  Near completion of procedure he was noted to be lethargic and hypoglycemic with left neglect and left gaze preference as well as right-sided weakness.  CTA head/neck was negative for occlusion or aneurysm or LVO.  Follow-up MRI brain showed acute infarct in right thalamus with small petechial areas in bilateral frontal lobe and right parietal white matter  suggestive of cerebral emboli.  Stroke felt to be periprocedural and triple therapy recommended with discontinuation of Plavix after a month.    CT chest was ordered due to concerns of lung mass and showed substantially enlarged dominant pulmonary nodule as well as ascending thoracic aorta measuring 4.2 cm.  Patient to follow up with Dr. Valeta Harms for pulmonary work-up after discharge.  Sutures on foot to stay in place till follow-up with podiatry after discharge.  Therapy evaluations done showing poor safety awareness with balance deficits, diplopia with dizziness as well as difficulty with high-level tasks.  CIR was recommended due to functional decline.   Hospital Course: KENYADA HY was admitted to rehab 02/18/2021 for inpatient therapies to consist of PT and OT at least three hours five days a week. Past admission physiatrist, therapy team and rehab RN have worked together to provide customized collaborative inpatient rehab.  At admission patient required contact-guard assist with basic self-care/ IADL tasks and mod assist with mobility.  He was maintained on aspirin, Plavix and Eliquis and was tolerating this without side effects.  Follow-up CBC showed H&H and platelets to be stable. Serial check of BMET showed lytes and renal status to be WNL. His blood pressures were monitored on TID basis and were noted to be relatively stable.  His diabetes has been monitored with ac/hs CBG checks and  SSI was use prn for tighter BS control.  P.o. intake has been good and blood sugars have been relatively stable on Levemir and Jardiance.  His respiratory status has been stable without signs of overload.  No cardiac symptoms reported with increase in activity.  He was noted to have rash on his chest which has improved with addition of hydrocortisone cream.  His vision was improving and dizziness had resolved.  He was making steady gains in rehab.  He was able to complete ADLs at modified independent to supervision  level.  He was requiring supervision with use of rolling walker for transfers and to ambulate 200 feet.  On a.m. of 09/08 he reported feeling lightheaded as well as pain in his upper chest on awakening as well as anxiety.  He was noted to have significant hypotension as well as arrhthymias with HR ranging from 80-130s.  EKG done early am showed question of A flutter but repeat showed evidence of wide-complex tachycardia which was confirmed to be in V. tach via programmer.  Patient was essentially asymptomatic and this was broken with manual ATP as per Dr. Sallyanne Kuster.  He was discharged to stepdown bed on 02/24/21 for monitoring and management.   Diet: Heart healthy  Medications at discharge: Apixaban 5 mg p.o. twice daily 2.  Enteric-coated aspirin 81 mg p.o. daily 3.  Plavix 75 mg p.o. per day 4.  Mexitil 150 mg p.o. twice daily 5.  Crestor 40 mg p.o. daily 6.  Singular 100 mg p.o. daily 7.  Betapace 160 mg p.o. twice daily 8.  Jardiance 25 mg p.o. daily 9.  Budesonide 0.5 mg nebs twice daily 10.  Hydrocortisone 1% cream to chest twice daily 11.  Metoprolol 87.5 mg p.o. daily 12.  Entresto 24-26 mg p.o. twice daily  Discharge disposition: 02-Transferred to [facility]       Signed: Bary Leriche 02/25/2021, 6:52 PM

## 2021-02-25 NOTE — Evaluation (Addendum)
Physical Therapy Evaluation Patient Details Name: John [name] MRN: [id number] DOB: 1951/01/28 Today's Date: 02/25/2021   History of Present Illness  Pt transferred back to acute side of hospital from [place] on 02/24/21 due to EKG showing VT. Initially, pt presented to the ED 8/29 with chest pain. Pt with NSTEMI. Pt underwent cardiac cath 8/30 and post procedure developed lt sided weakness, lt neglect, and rt gaze preference. MRI: Acute infarct right thalamus. Small punctate areas of acute infarct  in the frontal lobes bilaterally and right parietal white matter.  Findings suggest cerebral emboli. PMH - CAD, multiple cardiac stents, HTN, OSA, aflutter, vtach, ICD, VT ablation 12/25/37, chronic systolic HF, lt second toe amputation 8/2 with no weight bearing restrictions, back surgery with resultant lt foot drop.   Clinical Impression  Pt presents with condition above and deficits mentioned below, see PT Problem List. Prior to his initial admission, pt was independent and living with his wife in a 1-level house with 4 STE in the front. Currently, pt displays deficits in balance along with slight general weakness in his L leg (hx of significant weakness in ankle muscles following back surgery). Pt can be mildly impulsive, insisting on being independent at times also. He currently requires UE support for safe mobility due to his deficits. Pt is able to negotiate stairs at a min guard level and ambulate community distances with a RW with min guard-supervision. Educated pt to use RW even in bedroom to ensure his safety. Will continue to follow acutely. Recommend follow-up with Neuro Outpatient PT and OT.    Follow Up Recommendations Outpatient PT;Supervision for mobility/OOB (neuro)    Equipment Recommendations  Rolling walker with 5" wheels;3in1 (PT)    Recommendations for Other Services OT consult     Precautions / Restrictions Precautions Precautions: Fall Precaution Comments: L foot drop at  baseline Restrictions Weight Bearing Restrictions: No      Mobility  Bed Mobility               General bed mobility comments: Pt sitting up EOB upon arrival.    Transfers Overall transfer level: Needs assistance Equipment used: Rolling walker (2 wheeled) Transfers: Sit to/from Stand Sit to Stand: Supervision         General transfer comment: Pt able to power up to stand quickly without LOB, supervision for safety.  Ambulation/Gait Ambulation/Gait assistance: Supervision;Min guard Gait Distance (Feet): 450 Feet (x2 bouts of ~10 ft without RW > ~450 ft with RW) Assistive device: Rolling walker (2 wheeled);None;1 person hand held assist Gait Pattern/deviations: Step-through pattern;Decreased step length - left;Steppage;Decreased dorsiflexion - left;Narrow base of support Gait velocity: decr Gait velocity interpretation: >2.62 ft/sec, indicative of community ambulatory General Gait Details: Hx of steppage gait on L. Slows gait to change head positions. Min increase in gait speed when cued, but able to slow and stop without LOB. Cues ot remain within RW during turns. No LOB, min guard-supervision for safety. During short bout, pt ambulating partially in room without UE support and occasional reach and support with 1 UE on furniture, displaying increased instability, educated to use RW within room too.  Stairs Stairs: Yes Stairs assistance: Min guard Stair Management: Two rails;Alternating pattern;Forwards Number of Stairs: 4 General stair comments: Ascends and descends with bil handrail support with reciprocal pattern, no LOB, min guard for safety.  Wheelchair Mobility    Modified Rankin (Stroke Patients Only) Modified Rankin (Stroke Patients Only) Pre-Morbid Rankin Score: No symptoms Modified Rankin: Moderately severe disability  Balance Overall balance assessment: Needs assistance Sitting-balance support: Feet supported;No upper extremity supported Sitting  balance-Leahy Scale: Good     Standing balance support: Bilateral upper extremity supported;No upper extremity supported;Single extremity supported;During functional activity Standing balance-Leahy Scale: Fair Standing balance comment: Able to stand without UE support but benefits from at least 1 UE support for improved stability.                             Pertinent Vitals/Pain Pain Assessment: Faces Faces Pain Scale: No hurt Pain Intervention(s): Monitored during session    Home Living Family/patient expects to be discharged to:: Private residence Living Arrangements: Spouse/significant other Available Help at Discharge: Family;Available 24 hours/day (Pt takes care of wife. Daughter and granddaughter currently providing assist for wife) Type of Home: House Home Access: Stairs to enter;Ramped entrance Entrance Stairs-Rails: Left Entrance Stairs-Number of Steps: 4 STE in the front, home made ramp in the back for the dogs primarily, but he did go up and down.  R rail. Not ADA compliant. Home Layout: One level   Additional Comments: spouse has a wheelchair    Prior Function Level of Independence: Independent         Comments: owns a home made walking stick     Hand Dominance   Dominant Hand: Right    Extremity/Trunk Assessment   Upper Extremity Assessment Upper Extremity Assessment: Overall WFL for tasks assessed    Lower Extremity Assessment Lower Extremity Assessment: LLE deficits/detail LLE Deficits / Details: ankle 0/5 from prior back surgery, otherwise 4+ to 5 grossly compared to 5/5 on R grossly; denied numbness/tingling    Cervical / Trunk Assessment Cervical / Trunk Assessment: Normal  Communication   Communication: No difficulties  Cognition Arousal/Alertness: Awake/alert Behavior During Therapy: Impulsive (mildly) Overall Cognitive Status: Impaired/Different from baseline Area of Impairment: Safety/judgement;Awareness                          Safety/Judgement: Decreased awareness of safety Awareness: Anticipatory   General Comments: Pt mildly impulsive and quick to perform tasks or assume positions unexpected by PT, but could be due to pt's desire to be more independent. Cues to improve safety with bedroom mobility.      General Comments      Exercises     Assessment/Plan    PT Assessment Patient needs continued PT services  PT Problem List Decreased strength;Decreased balance;Decreased cognition;Decreased mobility;Decreased knowledge of use of DME;Decreased safety awareness       PT Treatment Interventions DME instruction;Functional mobility training;Balance training;Patient/family education;Gait training;Therapeutic activities;Therapeutic exercise;Cognitive remediation;Stair training;Neuromuscular re-education    PT Goals (Current goals can be found in the Care Plan section)  Acute Rehab PT Goals Patient Stated Goal: go home tomorrow PT Goal Formulation: With patient Time For Goal Achievement: 03/04/21 Potential to Achieve Goals: Good    Frequency Min 4X/week   Barriers to discharge        Co-evaluation               AM-PAC PT "6 Clicks" Mobility  Outcome Measure Help needed turning from your back to your side while in a flat bed without using bedrails?: None Help needed moving from lying on your back to sitting on the side of a flat bed without using bedrails?: None Help needed moving to and from a bed to a chair (including a wheelchair)?: A Little Help needed standing up from a chair using  your arms (e.g., wheelchair or bedside chair)?: A Little Help needed to walk in hospital room?: A Little Help needed climbing 3-5 steps with a railing? : A Little 6 Click Score: 20    End of Session   Activity Tolerance: Patient tolerated treatment well Patient left: in bed;with call bell/phone within reach   PT Visit Diagnosis: Unsteadiness on feet (R26.81);Other abnormalities of gait and  mobility (R26.89);Muscle weakness (generalized) (M62.81);Difficulty in walking, not elsewhere classified (R26.2);Other symptoms and signs involving the nervous system (R29.898)    Time: 1211-1223 PT Time Calculation (min) (ACUTE ONLY): 12 min   Charges:   PT Evaluation $PT Eval Moderate Complexity: 1 Mod          Moishe Spice, PT, DPT Acute Rehabilitation Services  Pager: [phone] Office: [phone]   Orvan Falconer 02/25/2021, 12:46 PM

## 2021-02-25 NOTE — TOC Initial Note (Signed)
Transition of Care [facility]) - Initial/Assessment Note    Patient Details  Name: John [name] MRN: [id number] Date of Birth: 1951-04-08  [facility] CM/SW Contact:    Zenon Mayo, RN Phone Number: 02/25/2021, 4:01 PM  Clinical Narrative:                 NCM spoke with patient at bedside, he would like to do outpatient therapy at [facility] in [place],  [place] sent referral over to [facility] and confirmed they received the referral for outpt PT and OT.  He already has a rolling walker at home.  Plan is for possible dc tomorrow.  Expected Discharge Plan: Home/Self Care Barriers to Discharge: Continued Medical Work up   Patient Goals and CMS Choice Patient states their goals for this hospitalization and ongoing recovery are:: to return home with outpt physical therapy   Choice offered to / list presented to : NA  Expected Discharge Plan and Services Expected Discharge Plan: Home/Self Care In-house Referral: NA Discharge Planning Services: CM Consult Post Acute Care Choice: NA Living arrangements for the past 2 months: Single Family Home                   DME Agency: NA       HH Arranged: NA          Prior Living Arrangements/Services Living arrangements for the past 2 months: Single Family Home Lives with:: Spouse Patient language and need for interpreter reviewed:: Yes Do you feel safe going back to the place where you live?: Yes      Need for Family Participation in Patient Care: Yes (Comment) Care giver support system in place?: Yes (comment) Current home services: DME (rolling walker) Criminal Activity/Legal Involvement Pertinent to Current Situation/Hospitalization: No - Comment as needed  Activities of Daily Living Home Assistive Devices/Equipment: Walker (specify type) ADL Screening (condition at time of admission) Patient's cognitive ability adequate to safely complete daily activities?: Yes Is the patient deaf or have difficulty hearing?:  No Does the patient have difficulty seeing, even when wearing glasses/contacts?: No Does the patient have difficulty concentrating, remembering, or making decisions?: No Patient able to express need for assistance with ADLs?: Yes Does the patient have difficulty dressing or bathing?: No Independently performs ADLs?: Yes (appropriate for developmental age) Does the patient have difficulty walking or climbing stairs?: No Weakness of Legs: None Weakness of Arms/Hands: None  Permission Sought/Granted                  Emotional Assessment Appearance:: Appears stated age Attitude/Demeanor/Rapport: Engaged Affect (typically observed): Appropriate Orientation: : Oriented to  Time, Oriented to Situation, Oriented to Place, Oriented to Self Alcohol / Substance Use: Not Applicable Psych Involvement: No (comment)  Admission diagnosis:  VT (ventricular tachycardia) (HCC) [I47.2] Patient Active Problem List   Diagnosis Date Noted   Embolic stroke ([place]) 54/62/7035   ACS (acute coronary syndrome) ([place])    Left-sided neglect    Acute focal neurological deficit    NSTEMI (non-ST elevated myocardial infarction) ([place]) 02/14/2021   Diabetic infection of left foot ([place]) 00/93/8182   Chronic systolic CHF (congestive heart failure) ([place]) 02/08/2021   History of ventricular tachycardia 02/08/2021   VT (ventricular tachycardia) ([place]) 01/26/2021   Coagulation defect ([place]) 12/22/2020   Non-ST elevation (NSTEMI) myocardial infarction [facility])    Ventricular tachycardia ([place]) 08/18/2020   Capsulitis 05/21/2020   Elevated troponin    Hyperglycemia due to diabetes mellitus ([place]) 02/29/2020  Thoracic aortic aneurysm ([place]) 02/29/2020   Hypotensive episode 02/29/2020   Obesity (BMI 30.0-34.9) 02/29/2020   Lung nodule 02/19/2020   Former smoker 02/19/2020   Healthcare maintenance 02/19/2020   Acoustic neuroma ([place]) 09/11/2017   Asymmetrical sensorineural hearing loss 09/11/2017   Encounter for  cardioversion procedure    Anticoagulation adequate 06/06/2016   Fatigue 10/27/2015   Bradycardia 10/27/2015   Hyperlipidemia LDL goal <70 05/20/2015   OSA on CPAP 07/23/2013   Excessive daytime sleepiness 07/23/2013   Hypertension    Hyperlipidemia    Diabetes mellitus    Chest pain 08/18/2012   Tobacco abuse 08/18/2012   Unstable angina, cath [facility] 02/27/12 02/26/2012   CAD, multiple prior RCA PCI's. Last cath 2010, Myoview low risk Oct 2012 02/26/2012   DM type 2, goal HbA1c < 7% (HCC) 02/26/2012   HTN (hypertension) 02/26/2012   Dyslipidemia, low HDL. Intol to fish oil and Noacin 02/26/2012   Sleep apnea, on C-pap 02/26/2012   COPD (chronic obstructive pulmonary disease) ([place]) 02/26/2012   Smoking, quit one week ago 02/26/2012   GERD (gastroesophageal reflux disease) 02/26/2012   PCP:  [name], MD Pharmacy:   [id number] - [place], [address] [place] [address] Phone: [phone] Fax: [phone]     Social Determinants of Health (SDOH) Interventions    Readmission Risk Interventions Readmission Risk Prevention Plan 02/25/2021 02/18/2021  Transportation Screening Complete Complete  PCP or Specialist Appt within 3-5 Days Complete -  HRI or Home Care Consult Complete -  Social Work Consult for [place] Planning/Counseling Complete -  Palliative Care Screening Not Applicable -  Medication Review Press photographer) Complete Complete  PCP or Specialist appointment within 3-5 days of discharge - Complete  HRI or [place] - Complete  SW Recovery Care/Counseling Consult - Complete  [place] - Not Applicable  Some recent data might be hidden

## 2021-02-25 NOTE — Telephone Encounter (Signed)
Patient is at [facility] and they need a provider to come over and see this patient because he has not had his stitches removed since sx.   Please Advise   Dennard Schaumann [phone]

## 2021-02-25 NOTE — Progress Notes (Signed)
Mobility Specialist Progress Note:   02/25/21 0930  Therapy Vitals  Pulse Rate 69  BP 112/82  Oxygen Therapy  SpO2 97 %  O2 Device Room Air  Mobility  Head of Bed Elevated  Self regulated  Activity Ambulated in hall;Dangled on edge of bed  Range of Motion/Exercises Active;All extremities  Level of Assistance Standby assist, set-up cues, supervision of patient - no hands on  Assistive Device Front wheel walker  Minutes Stood 3 minutes  Minutes Ambulated 3 minutes  Distance Ambulated (ft) 400 ft  Mobility Ambulated with assistance in hallway  Mobility Response Tolerated well  Mobility performed by Mobility specialist  Bed Position Semi-fowlers  $Mobility charge 1 Mobility   Pre- Mobility:HR 69 bpm; BP 112/82; SpO2 97% Post Mobility:  HR 92; BP 126/85; SpO2 97%  Pt ambulated 400 ft in hallway with RW at supervision. Pt tolerated ambulation well, was left dangling EOB with all needs met.   [facility] Health and safety inspector Phone [phone]

## 2021-02-25 NOTE — Consult Note (Addendum)
Cardiology Consultation:   Patient ID: John [name] MRN: [id number]; DOB: Jan 11, 1951  Admit date: 02/24/2021 Date of Consult: 02/25/2021  PCP:  [name], MD   [facility] Providers Cardiologist:  Shelva Majestic, MD  Electrophysiologist:  Constance Haw, MD    Patient Profile:   John [name] is a 70 y.o. male with a hx of   CAD(multiple prior PCIs reported in the RCA and Cx, last stenting 02/15/21), ICM, HTN, HLD, DM, OSA (w/CPAP), TAA, ILD, quit smoking 2019, AFlutter, back surgery resulting in L foot drop, chronic back pain, VT, who is being seen 02/24/2021 for the evaluation of VT requested by Dr. Vivia Ewing.   Device information MDT dual chamber ICD implanted 08/20/20 Secondary prevention w/hx of hemodynamically unstable MMVT   AAD hx 2018 Pulmonary function studies have shown restriction with low DLCO.  He felt possibly also to have asthma based on nitric oxide testing.  With his lung disease it has been recommended that amiodarone be considered for discontinuance Looks like amio was stopped in 2019 Follows with pulmonology March 2022 mexilletine > recurrent VT higher doses were too expensive June 2022 sotalol started Aug 2022 more VT and mexiletine resumed 01/18/21, VT ablation 02/09/21, device interrogation done for MRI noted VT treated with ATP and mexiletine ordered for increase to 300mg  BID >>>> THIS DID NOT GET TRANSLATED ON DISCHARGE AND remained at 134ms   History of Present Illness:   John [name] has had a long year with recurrent VT and medical issues. He was Hospitalized March,  June, July, Aug 2022 with VT, AAD added and escalated as above, also had a VT ablation last month  He was admitted 02/11/21 with L great toe cellulitis and underwent amputation.   Readmitted 02/14/21 with CP, NSTEMI, underwent cath and PCI  (Difficult for successful PTCA/PCI using 3.5 mm ScoreFlex balloon multiple inflations with post dilation of the previously stented)  overlapping Onyx Frontier DES 2.75 mm x 22 mm placed overlapping distally); prox RCA ~50%, prox-distal RCA 35%.  Unfortunately suffered a stroke and also found with a lung mass.   Finally discharged to rehab 02/18/21  Yesterday morning woke feeling quite well, though noted light palpitations.  Unusually weak at therapy though with lower BPs and vital noted elevated HRs.  EKG done finding VT.  Cardiology called. Device interrogation confirmed VT, noting some monitored episodes and 2 that were successfully treated with his programmed ATP scheme over night.  VT rates though yesterday below detection.  Device ATP scheme yesterday failed, though Dr. Sallyanne Kuster was successful in converting home with programmed stim at 371ms Throughout he was hemodynamically stable and asymptomatic  Note that previously recommend dose increase of his mexiletine was not followed through onpost toe surgery, and he was admitted back to in patient from rehab with plans to observ on telemetry and increase his mexiletine. He had no anginal symptoms, and not suspected to have ACS  This morning he feels well, wants to go home from here, not back to rehab   Past Medical History:  Diagnosis Date   Atrial flutter ([place])    s/p cardioversion   Coronary artery disease    Diabetes mellitus    GERD (gastroesophageal reflux disease)    History of nuclear stress test 04/04/2011   lexiscan; mod-large in size fixed inferolateral defect (scar); non-diagnostic for ischemia; low risk scan    Hyperlipidemia    Hypertension    Left foot drop    r/t past disk srugery - uses  Kevlar brace   Myocardial infarction [facility])    posterior MI   Shortness of breath    Sleep apnea    on CPAP; 04/28/2007 split-night - AHI during total sleep 44.43/hr and REM 72.56/hr    Past Surgical History:  Procedure Laterality Date   AMPUTATION TOE Left 02/10/2021   Procedure: AMPUTATION  LEFT SECOND TOE;  Surgeon: Edrick Kins, DPM;  Location: [place];   Service: Podiatry;  Laterality: Left;   [place]  2010   6 stents total   CARDIAC CATHETERIZATION  01/2000   percutaneous transluminal coronary balloon angioplasty of mid RCA stenotic lesion   CARDIAC CATHETERIZATION  06/2006   no stenting; ischemic cardiomyopathy, EF 40-45%   CARDIOVERSION N/A 07/28/2016   Procedure: CARDIOVERSION;  Surgeon: Troy Sine, MD;  Location: [place];  Service: Cardiovascular;  Laterality: N/A;   CORONARY ANGIOPLASTY  09/1998   mid-distal RCA balloon dilatation, 4.5 & 5.0 stents    CORONARY ANGIOPLASTY WITH STENT PLACEMENT  03/1994   angioplasty & stenting (non-DES) of circumflex/prox ramus intermedius   CORONARY ANGIOPLASTY WITH STENT PLACEMENT  10/1994   large iliac [id number] stent to RCA   [place]  12/2002   4.107mm stents x2 of RCA   CORONARY ANGIOPLASTY WITH STENT PLACEMENT  01/2005   cutting balloon arthrectomy of distal RCA & Cypher DES 3.5x13; cutting balloon arthrectomy of mid RCA with Cypher DES 3.5x18   CORONARY ANGIOPLASTY WITH STENT PLACEMENT  11/2008   stenting of mid RCA with 4.0x51mm driver, non-DES   CORONARY BALLOON ANGIOPLASTY N/A 02/15/2021   Procedure: CORONARY BALLOON ANGIOPLASTY;  Surgeon: Troy Sine, MD;  Location: [place] [facility];  Service: Cardiovascular;  Laterality: N/A;   ICD IMPLANT N/A 08/20/2020   Procedure: ICD IMPLANT;  Surgeon: Constance Haw, MD;  Location: [place] [facility];  Service: Cardiovascular;  Laterality: N/A;   INTRAVASCULAR PRESSURE WIRE/FFR STUDY N/A 03/02/2020   Procedure: INTRAVASCULAR PRESSURE WIRE/FFR STUDY;  Surgeon: Leonie Man, MD;  Location: [place] [facility];  Service: Cardiovascular;  Laterality: N/A;   LEFT HEART CATH AND CORONARY ANGIOGRAPHY N/A 03/02/2020   Procedure: LEFT HEART CATH AND CORONARY ANGIOGRAPHY;  Surgeon: Leonie Man, MD;  Location: [place] [facility];  Service: Cardiovascular;  Laterality: N/A;   LEFT  HEART CATH AND CORONARY ANGIOGRAPHY N/A 08/19/2020   Procedure: LEFT HEART CATH AND CORONARY ANGIOGRAPHY;  Surgeon: Lorretta Harp, MD;  Location: [place] [facility];  Service: Cardiovascular;  Laterality: N/A;   LEFT HEART CATH AND CORONARY ANGIOGRAPHY N/A 02/15/2021   Procedure: LEFT HEART CATH AND CORONARY ANGIOGRAPHY;  Surgeon: Troy Sine, MD;  Location: [place] [facility];  Service: Cardiovascular;  Laterality: N/A;   LEFT HEART CATHETERIZATION WITH CORONARY ANGIOGRAM N/A 02/27/2012   Procedure: LEFT HEART CATHETERIZATION WITH CORONARY ANGIOGRAM;  Surgeon: Lorretta Harp, MD;  [facility];  Service: Cardiovascular;  Laterality: N/A;   TRANSTHORACIC ECHOCARDIOGRAM  07/29/2010   EF 50=55%, mod inf wall hypokinesis & mild post wall hypokinesis; LA mild-mod dilated; mild mitral annular calcif & mild MR; mild TR & elevated RV systolic pressure; AV mildly sclerotic; mild aortic root dilatation    V TACH ABLATION N/A 01/20/2021   Procedure: V TACH ABLATION;  Surgeon: Vickie Epley, MD;  Location: [place] [facility];  Service: Cardiovascular;  Laterality: N/A;     Home Medications:  Prior to Admission medications  Medication Sig Start Date End Date Taking? Authorizing Provider  acetaminophen (TYLENOL) 500 MG tablet Take 500 mg by mouth every 6 (six) hours as needed for moderate pain.    [provider]  albuterol (VENTOLIN HFA) 108 (90 Base) MCG/ACT inhaler Inhale 2 puffs into the lungs every 6 (six) hours as needed for wheezing or shortness of breath. 06/14/20   Garner Nash, DO  aspirin EC 81 MG EC tablet Take 1 tablet (81 mg total) by mouth daily. Swallow whole. 02/19/21   Ledora Bottcher, PA  clopidogrel (PLAVIX) 75 MG tablet Take 1 tablet (75 mg total) by mouth daily with breakfast. 02/19/21   Duke, Tami Lin, PA  ELIQUIS 5 MG TABS tablet TAKE 1 TABLET BY MOUTH TWICE A DAY Patient taking differently: Take 5 mg by mouth 2 (two) times daily. 10/13/20   Dimitrius Steedman,  Elivia Robotham Hassell Done, MD  ENTRESTO 24-26 MG TAKE 1 TABLET BY MOUTH TWICE A DAY Patient taking differently: Take 1 tablet by mouth 2 (two) times daily. 10/08/20   Troy Sine, MD  Fluticasone Furoate (ARNUITY ELLIPTA) 200 MCG/ACT AEPB Inhale 1 puff into the lungs daily. 12/29/20   Icard, Leory Plowman L, DO  glucose blood (ONETOUCH ULTRA) test strip USE TO CHECK BLOOD SUGAR 3 TIMES A DAY (E11.9) 12/09/20   [name], MD  HYDROcodone-acetaminophen (NORCO/VICODIN) 5-325 MG tablet Take 1 tablet by mouth every 6 (six) hours as needed for severe pain. Patient not taking: No sig reported 02/11/21   Arrien, Jimmy Picket, MD  insulin aspart (NOVOLOG FLEXPEN) 100 UNIT/ML FlexPen INJECT 5-20 UNITS SUBCUTANEOUSLY WITH LUNCH AND DINNER Patient taking differently: Inject 6-8 Units into the skin daily as needed for high blood sugar. 06/16/20   [name], MD  Insulin Pen Needle (NOVOFINE) 32G X 6 MM MISC 1 each by Other route daily. 08/19/19   [name], MD  JARDIANCE 25 MG TABS tablet TAKE 1 TABLET BY MOUTH EVERY DAY Patient taking differently: Take 25 mg by mouth daily. 11/09/20   [name], MD  ketoconazole (NIZORAL) 2 % shampoo Apply 1 application topically 2 (two) times a week. 02/09/21   [provider]  LEVEMIR FLEXTOUCH 100 UNIT/ML FlexPen INJECT 46 UNITS INTO THE SKIN DAILY. DX:E11.9 Patient taking differently: Inject 46 Units into the skin daily. 01/11/21   [name], MD  metoprolol succinate (TOPROL-XL) 100 MG 24 hr tablet Take 1 tablet (100 mg total) by mouth daily. Take with or immediately following a meal. Patient taking differently: Take 100 mg by mouth daily. 11/26/20   Roberto Romanoski, Ocie Doyne, MD  mexiletine (MEXITIL) 150 MG capsule Take 2 capsules (300 mg total) by mouth every 12 (twelve) hours. Patient taking differently: Take 150 mg by mouth 2 (two) times daily. 02/09/21   Baldwin Jamaica, PA-C  montelukast (SINGULAIR) 10 MG tablet TAKE 1 TABLET BY MOUTH EVERY  DAY Patient taking differently: Take 10 mg by mouth daily. 08/19/20   [name], MD  nitroGLYCERIN (NITROSTAT) 0.4 MG SL tablet PLACE 1 TABLET (0.4 MG TOTAL) UNDER THE TONGUE EVERY 5 (FIVE) MINUTES AS NEEDED FOR CHEST PAIN. 07/27/20   Troy Sine, MD  rosuvastatin (CRESTOR) 40 MG tablet Take 1 tablet (40 mg total) by mouth daily. 02/18/21   Duke, Tami Lin, PA  sotalol (BETAPACE) 160 MG tablet Take 1 tablet (160 mg total) by mouth 2 (two) times daily. 01/28/21   Baldwin Jamaica, PA-C    Inpatient Medications: Scheduled Meds:  apixaban  5 mg Oral BID   aspirin EC  81 mg Oral Daily   budesonide  2 mL Inhalation BID   clopidogrel  75 mg Oral Q breakfast   empagliflozin  25 mg Oral Daily   hydrocortisone cream   Topical BID   insulin aspart  0-15 Units Subcutaneous TID WC   metoprolol succinate  100 mg Oral Daily   mexiletine  300 mg Oral Q12H   montelukast  10 mg Oral Daily   potassium chloride  40 mEq Oral Once   rosuvastatin  40 mg Oral Daily   sodium chloride flush  3 mL Intravenous Q12H   sotalol  160 mg Oral BID   Continuous Infusions:  sodium chloride     PRN Meds: sodium chloride, acetaminophen, albuterol, HYDROcodone-acetaminophen, nitroGLYCERIN, polyethylene glycol, sodium chloride flush, traZODone  Allergies:    Allergies  Allergen Reactions   Omega-3 Fatty Acids Hives and Itching   Benazepril Other (See Comments)    hyperkalemia   Fish Allergy Itching    Social History:   Social History   Socioeconomic History   Marital status: Married    Spouse name: Not on file   Number of children: 3   Years of education: Not on file   Highest education level: Not on file  Occupational History   Occupation: Best boy: [employer]    Comment: [place], [place]. [employer]  Tobacco Use   Smoking status: Former    Packs/day: 1.00    Years: 50.00    Pack years: 50.00    Types: Cigarettes    Quit date: 07/20/2016    Years since quitting: 4.6   Smokeless  tobacco: Never  Vaping Use   Vaping Use: Never used  Substance and Sexual Activity   Alcohol use: Not Currently    Alcohol/week: 0.0 standard drinks   Drug use: No   Sexual activity: Yes  Other Topics Concern   Not on file  Social History Narrative   Not on file   Social Determinants of Health   Financial Resource Strain: Not on file  Food Insecurity: Not on file  Transportation Needs: Not on file  Physical Activity: Not on file  Stress: Not on file  Social Connections: Not on file  Intimate Partner Violence: Not on file    Family History:   Family History  Problem Relation Age of Onset   Heart attack Father      ROS:  Please see the history of present illness.  All other ROS reviewed and negative.     Physical Exam/Data:   Vitals:   02/24/21 2005 02/24/21 2230 02/25/21 0410 02/25/21 0756  BP:  (!) 86/50    Pulse:  70    Resp:  16    TempSrc:      SpO2: 95%   97%  Weight:   94.3 kg   Height:        Intake/Output Summary (Last 24 hours) at 02/25/2021 0912 Last data filed at 02/25/2021 0807 Gross per 24 hour  Intake 900 ml  Output --  Net 900 ml   Last 3 Weights 02/25/2021 02/24/2021 02/18/2021  Weight (lbs) 208 lb 209 lb 11.2 oz 208 lb 5.4 oz  Weight (kg) 94.348 kg 95.119 kg 94.5 kg     Body mass index is 28.21 kg/m.  General:  Well nourished, well developed, in no acute distress HEENT: normal Lymph: no adenopathy Neck: no JVD Endocrine:  No thryomegaly Vascular: No carotid  bruits Cardiac:  RRR; no murmurs, gallops or rubs Lungs:  CTA b/l, no wheezing, rhonchi or rales  Abd: soft, nontender  Ext: no edema Musculoskeletal:  No deformities, ted hose to RLE (pt reports sutures remain) Skin: warm and dry  Neuro: no focal abnormalities noted Psych:  Normal affect   EKG:  The EKG was personally reviewed and demonstrates:   VT 138bpm VT 139bpm  Telemetry:  Telemetry was personally reviewed and demonstrates:   AP acing mostly, rare AV pacing  Relevant CV  Studies:    02/15/21: LHC/PCI   1st Mrg lesion is 55% stenosed.   Prox RCA lesion is 50% stenosed.   Prox RCA to Dist RCA lesion is 35% stenosed.   RPAV-1 lesion is 100% stenosed.   RPAV-2 lesion is 80% stenosed.   A stent was successfully placed.   Post intervention, there is a 0% residual stenosis.   Post intervention, there is a 0% residual stenosis.   Acute coronary syndrome secondary to total occlusion of a large distal right coronary artery at a site of prior distal stenting.   The LAD has mild irregularity without significant disease.  The large ramus intermediate vessel has previously noted 55% very proximal stenosis not significantly changed.  The AV groove circumflex is a small vessel.   The RCA is a very large vessel with diffuse stents proximally to very distally.  There is proximal 50% stenosis prior to the proximal to mid stent.  There is diffuse 35% narrowing within the long proximal stent.  The distal stent is totally occluded just after PDA vessel.   Difficult but successful PCI requiring PTCA, a 3.5 x 15 mm Scorflex multiple dilatations throughout the previously placed tandem 3.5 mm  distal stents with ultimate noncompliant balloon dilatation to approximately 3.6 mm and insertion of a 2.75 x 22 mm Onyx Frontier new stent distal to the distal stent placed in overlap 75 to 80% stenosis beyond the stented segment, postdilated with a 3 oh balloon with residual narrowing of 0%.   LVEDP 18 mmHg   RECOMMENDATION: Initial triple drug therapy with plan discontinuance of aspirin and continuation of Plavix/for minimum of 6 to 12 months.  Near the completion of the procedure, the patient appeared slightly less responsive.  Blood sugar was obtained which was 63.  He was given one half amp of D50.  After the procedure, a subtle left facial droop developed and there appeared to be  left arm weakness..  A code stroke was activated and the patient was seen immediately by neurology and sent  for head CT for further evaluation.     02/15/21: TTE IMPRESSIONS   1. Left ventricular ejection fraction, by estimation, is 40 to 45%. The  left ventricle has mildly decreased function. The left ventricle  demonstrates regional wall motion abnormalities (see scoring  diagram/findings for description). The left ventricular   internal cavity size was moderately dilated. Left ventricular diastolic  parameters are consistent with Grade I diastolic dysfunction (impaired  relaxation).   2. Right ventricular systolic function is normal. The right ventricular  size is normal. There is normal pulmonary artery systolic pressure.   3. Left atrial size was mildly dilated.   4. The mitral valve is normal in structure. Trivial mitral valve  regurgitation. No evidence of mitral stenosis.   5. The aortic valve is normal in structure. Aortic valve regurgitation is  not visualized.   6. Aortic dilatation noted. There is borderline dilatation of the aortic  root, measuring  38 mm.   Comparison(s): Prior images reviewed side by side. The left ventricular  function is unchanged. The left ventricular wall motion abnormalities are  worse, although the general distribution is similar. There seems to be  more some worsening of contractility  in the more distal portion of the inferior and inferoseptal walls, with  sparing of the true apex and without meaningful change in overall LVEF.    Laboratory Data:  High Sensitivity Troponin:   Recent Labs  Lab 01/26/21 1235 01/26/21 1421 02/14/21 1727 02/14/21 1927 02/14/21 2237  TROPONINIHS 132* 124* 3,076* 2,458* 2,923*     Chemistry Recent Labs  Lab 02/21/21 0547 02/24/21 1058 02/25/21 0330  NA 144 140 141  K 3.5 4.1 3.6  CL 110 109 107  CO2 29 24 24   GLUCOSE 139* 162* 111*  BUN 13 17 18   CREATININE 1.08 1.31* 1.05  CALCIUM 8.7* 8.7* 8.8*  GFRNONAA >60 58* >60  ANIONGAP 5 7 10     Recent Labs  Lab 02/19/21 0507  PROT 5.4*  ALBUMIN 2.8*   AST 16  ALT 17  ALKPHOS 69  BILITOT 0.6   Hematology Recent Labs  Lab 02/19/21 0507 02/21/21 0547 02/24/21 1058  WBC 6.1 7.4 7.3  RBC 5.43 5.44 5.34  HGB 16.3 16.2 16.2  HCT 50.1 50.9 49.5  MCV 92.3 93.6 92.7  MCH 30.0 29.8 30.3  MCHC 32.5 31.8 32.7  RDW 13.8 13.7 13.8  PLT 210 250 262   BNPNo results for input(s): BNP, PROBNP in the last 168 hours.  DDimer No results for input(s): DDIMER in the last 168 hours.   Radiology/Studies:  No results found.   Assessment and Plan:   VT Essentially asymptomatic Broke with manual stim by Dr. Sallyanne Kuster No changes were made to his therapy programming though monitor zone was reduced to 120bpm He did have successful ATP and no changes planned Continue same sotalol, and the increased mexiletine  Hopefully home tomorrow   2. AFlutter On Eliquis   3. CAD To continue total 30 days of triple therapy Continue meds   4. Lung nodule Recs per last admission Plan for biopsy after ASA has been stopped, in 4-6 weeks. This Kiala Faraj require readmission for heparin drip as a bridge, given recent stent. This Sarahi Borland need to be coordinated with cardiology.  With enlarging nodule - Nhan Qualley likely need biopsy for definitive Dx - unfortunately, he has just suffered 2 major events (MI - physiologically a STEMI although technically only NSTEMI by EKG criteria & Per-Procedural CVA with impressive improvement).  He is currently on Triple AC/Antiplatelet Rx with plans to continue at least 1 month -- then plan to d/c ASA & continue clopidogrel & apixiban. For Bx, would need to hold both agents  -  Apixiban x 48 hr & clopidogrel x 5 days (minimum) -- given recent MI & DES PCI - would be best if we could bridge with GP 2b3a Inhibitor while off of Clopidogrel & IV Heparin vs. Enoxaparin while of of apixiban. -- Katryn Plummer discuss options with [facility] team to determine best COA- but would prefer to wait at least 1 month (esp due to CVA  Aalijah Lanphere plan out patient follow up  with his pulmonologist   5. ICM Appears compensated Continue meds   6. DM Continue meds   7. Recent stroke Steady improvement reported D/e rehab APP  OK for independent ambulation in the room, for assistance to ambulate in halls PT evaluation today He wants to go home, not  back to rehab  8. PVD, osteo of the second toe distal and middle phalanx with adjacent soft tissue ulcer. Group B streptococcus L second toe amputation 02/10/21 Post op recommendation was to f/u with Follow up with [name] his PMD in 7 to 10 days. Letricia Krinsky reach out to podiatry    Risk Assessment/Risk Scores:     For questions or updates, please contact [place] Please consult [email or web address] for contact info under    Signed, Baldwin Jamaica, PA-C  02/25/2021 9:12 AM  I have seen and examined this patient with Tommye Standard.  Agree with above, note added to reflect my findings.  On exam, RRR, no murmurs.   Patient was in the rehab yesterday.  He was noted to be tachycardic.  ECG performed showed ventricular tachycardia.  ATP was attempted through the patient's ICD, though this did not terminate his tachycardia.  The patient was paced terminated manually.  He has a history of ventricular tachycardia, coronary artery disease status post recent stent, as well as stroke complicating left heart catheterization.  He is currently on sotalol 120 mg twice daily and mexiletine 150 mg twice daily.  Shanele Nissan increase mexiletine to 300 mg.  Should he not have any further episodes of ventricular tachycardia, would be okay for discharge tomorrow.  Ryen Heitmeyer M. Mat Stuard MD 02/25/2021 12:40 PM

## 2021-02-26 LAB — GLUCOSE, CAPILLARY
Glucose-Capillary: 117 mg/dL — ABNORMAL HIGH (ref 70–99)
Glucose-Capillary: 162 mg/dL — ABNORMAL HIGH (ref 70–99)

## 2021-02-26 MED ORDER — POTASSIUM CHLORIDE CRYS ER 20 MEQ PO TBCR
40.0000 meq | EXTENDED_RELEASE_TABLET | Freq: Once | ORAL | Status: AC
  Administered 2021-02-26: 40 meq via ORAL
  Filled 2021-02-26: qty 2

## 2021-02-26 MED ORDER — MEXILETINE HCL 150 MG PO CAPS: 300.0000 mg | ORAL_CAPSULE | Freq: Two times a day (BID) | ORAL | 5 refills | Status: DC

## 2021-02-26 NOTE — Progress Notes (Addendum)
Patient Name: John [name]   Patient Profile:    John [name] is a 70 y.o. male with a hx of   CAD(multiple prior PCIs reported in the RCA and Cx, last stenting 02/15/21), ICM, HTN, HLD, DM, OSA (w/CPAP), TAA, ILD, quit smoking 2019, AFlutter, back surgery resulting in L foot drop, chronic back pain, VT, who is being seen 02/24/2021 for the evaluation of VT requested by Dr. Vivia Ewing.   Device information MDT dual chamber ICD implanted 08/20/20 Secondary prevention w/hx of hemodynamically unstable MMVT   AAD hx 2018 Pulmonary function studies have shown restriction with low DLCO.  He felt possibly also to have asthma based on nitric oxide testing.  With his lung disease it has been recommended that amiodarone be considered for discontinuance Looks like amio was stopped in 2019 Follows with pulmonology March 2022 mexilletine > recurrent VT higher doses were too expensive June 2022 sotalol started Aug 2022 more VT and mexiletine resumed 01/18/21, VT ablation 02/09/21, device interrogation done for MRI noted VT treated with ATP and mexiletine ordered for increase to 300mg  BID >>>> THIS DID NOT GET TRANSLATED ON DISCHARGE AND remained at 189ms      SUBJECTIVE: Without complaint.  Walking without difficulty.  Stroke residua now minimal with his left eye.  Past Medical History:  Diagnosis Date   Atrial flutter ([place])    s/p cardioversion   Coronary artery disease    Diabetes mellitus    GERD (gastroesophageal reflux disease)    History of nuclear stress test 04/04/2011   lexiscan; mod-large in size fixed inferolateral defect (scar); non-diagnostic for ischemia; low risk scan    Hyperlipidemia    Hypertension    Left foot drop    r/t past disk srugery - uses Kevlar brace   Myocardial infarction ([facility])    posterior MI   Shortness of breath    Sleep apnea    on CPAP; 04/28/2007 split-night - AHI during total sleep 44.43/hr and REM 72.56/hr    Scheduled Meds:  Scheduled  Meds:  apixaban  5 mg Oral BID   aspirin EC  81 mg Oral Daily   budesonide  2 mL Inhalation BID   clopidogrel  75 mg Oral Q breakfast   empagliflozin  25 mg Oral Daily   hydrocortisone cream   Topical BID   insulin aspart  0-15 Units Subcutaneous TID WC   metoprolol succinate  100 mg Oral Daily   mexiletine  300 mg Oral Q12H   montelukast  10 mg Oral Daily   rosuvastatin  40 mg Oral Daily   sodium chloride flush  3 mL Intravenous Q12H   sotalol  160 mg Oral BID   Continuous Infusions:  sodium chloride     sodium chloride, acetaminophen, albuterol, HYDROcodone-acetaminophen, nitroGLYCERIN, polyethylene glycol, sodium chloride flush, traZODone    PHYSICAL EXAM Vitals:   02/25/21 1104 02/25/21 2036 02/26/21 0014 02/26/21 0747  BP: 109/70     Pulse: 70     Resp: 16     Temp: 97.8 F (36.6 C)     TempSrc: Oral     SpO2: 96% 95%  98%  Weight:   95.5 kg   Height:       Well developed and well nourished in no acute distress HENT normal Neck supple with JVP-flat Clear Device pocket well healed; without hematoma or erythema.  There is no tethering  Regular rate and rhythm, no  gallop No  murmur Abd-soft with  active BS No Clubbing cyanosis  edema Skin-warm and dry A & Oriented  Grossly normal sensory and motor function    TELEMETRY: Reviewed personnally pt sinus without VT:     Intake/Output Summary (Last 24 hours) at 02/26/2021 0813 Last data filed at 02/26/2021 0327 Gross per 24 hour  Intake 960 ml  Output 1125 ml  Net -165 ml    LABS: Basic Metabolic Panel: Recent Labs  Lab 02/21/21 0547 02/24/21 1058 02/25/21 0330  NA 144 140 141  K 3.5 4.1 3.6  CL 110 109 107  CO2 29 24 24   GLUCOSE 139* 162* 111*  BUN 13 17 18   CREATININE 1.08 1.31* 1.05  CALCIUM 8.7* 8.7* 8.8*   Cardiac Enzymes: No results for input(s): CKTOTAL, CKMB, CKMBINDEX, TROPONINI in the last 72 hours. CBC: Recent Labs  Lab 02/21/21 0547 02/24/21 1058  WBC 7.4 7.3  HGB 16.2 16.2   HCT 50.9 49.5  MCV 93.6 92.7  PLT 250 262        Device Interrogation: Device was interrogated and reprogrammed to incorporate a VT zone down to 120 bpm to allow for ATP for his slow VT.  With his desire that he not shocked for slow VT; hence, no shocks are in the VT zone   ASSESSMENT AND PLAN: Ventricular tachycardia-recurrent  Ischemic cardiomyopathy  Atrial fibrillation/flutter  Stroke    Implantable defibrillator-[employer]     No interval ventricular tachycardia.  Device reprogrammed as above.  Discharged on sotalol  and mexiletine. Apixaban and aspirin, metoprolol and rosuvastatin  Potassium relatively low this morning.  We will supplement and will need to be mid next week   Signed, [name] MD  02/26/2021

## 2021-02-26 NOTE — Discharge Summary (Addendum)
Discharge Summary    Patient ID: John [name] MRN: [id number]; DOB: [birth year]  Admit date: 02/24/2021 Discharge date: 02/26/2021  PCP:  John Frizzle, MD   [place] Prov[facility] Cardiologist:  John Majestic, MD  Electrophysiologist:  John Haw, MD  {  Discharge Diagnoses    Principal Problem:   Ventricular tachycardia [facility]) Active Problems:   CAD, multiple prior RCA PCI's. Last cath 2010, Myoview low risk Oct 2012   DM type 2, goal HbA1c < 7% (HCC)   HTN (hypertension)   COPD (chronic obstructive pulmonary disease) (HCC)   GERD (gastroesophageal reflux disease)   OSA on CPAP   Hyperlipidemia LDL goal <70   Anticoagulation adequate   Thoracic aortic aneurysm (HCC)   Chronic systolic CHF (congestive heart failure) ([place])   Embolic stroke [facility])    Diagnostic Studies/Procedures    02/15/21: LHC/PCI   1st Mrg lesion is 55% stenosed.   Prox RCA lesion is 50% stenosed.   Prox RCA to Dist RCA lesion is 35% stenosed.   RPAV-1 lesion is 100% stenosed.   RPAV-2 lesion is 80% stenosed.   A stent was successfully placed.   Post intervention, there is a 0% residual stenosis.   Post intervention, there is a 0% residual stenosis.   Acute coronary syndrome secondary to total occlusion of a large distal right coronary artery at a site of prior distal stenting.   The LAD has mild irregularity without significant disease.  The large ramus intermediate vessel has previously noted 55% very proximal stenosis not significantly changed.  The AV groove circumflex is a small vessel.   The RCA is a very large vessel with diffuse stents proximally to very distally.  There is proximal 50% stenosis prior to the proximal to mid stent.  There is diffuse 35% narrowing within the long proximal stent.  The distal stent is totally occluded just after PDA vessel.   Difficult but successful PCI requiring PTCA, a 3.5 x 15 mm Scorflex multiple dilatations throughout the previously placed  tandem 3.5 mm  distal stents with ultimate noncompliant balloon dilatation to approximately 3.6 mm and insertion of a 2.75 x 22 mm Onyx Frontier new stent distal to the distal stent placed in overlap 75 to 80% stenosis beyond the stented segment, postdilated with a 3 oh balloon with residual narrowing of 0%.   LVEDP 18 mmHg   RECOMMENDATION: Initial triple drug therapy with plan discontinuance of aspirin and continuation of Plavix/for minimum of 6 to 12 months.  Near the completion of the procedure, the patient appeared slightly less responsive.  Blood sugar was obtained which was 63.  He was given one half amp of D50.  After the procedure, a subtle left facial droop developed and there appeared to be  left arm weakness..  A code stroke was activated and the patient was seen immediately by neurology and sent for head CT for further evaluation.     02/15/21: TTE IMPRESSIONS   1. Left ventricular ejection fraction, by estimation, is 40 to 45%. The  left ventricle has mildly decreased function. The left ventricle  demonstrates regional wall motion abnormalities (see scoring  diagram/findings for description). The left ventricular   internal cavity size was moderately dilated. Left ventricular diastolic  parameters are consistent with Grade I diastolic dysfunction (impaired  relaxation).   2. Right ventricular systolic function is normal. The right ventricular  size is normal. There is normal pulmonary artery systolic pressure.   3. Left atrial size was mildly  dilated.   4. The mitral valve is normal in structure. Trivial mitral valve  regurgitation. No evidence of mitral stenosis.   5. The aortic valve is normal in structure. Aortic valve regurgitation is  not visualized.   6. Aortic dilatation noted. There is borderline dilatation of the aortic  root, measuring 38 mm.   Comparison(s): Prior images reviewed side by side. The left ventricular  function is unchanged. The left ventricular wall  motion abnormalities are  worse, although the general distribution is similar. There seems to be  more some worsening of contractility  in the more distal portion of the inferior and inferoseptal walls, with  sparing of the true apex and without meaningful change in overall LVEF.   _____________   History of Present Illness     John [name] is a 70 y.o. male with a hx of CAD(multiple prior PCIs reported in the RCA and Cx, last stenting 02/15/21), ICM, HTN, HLD, DM, OSA (w/CPAP), TAA, ILD, quit smoking 2019, AFlutter, back surgery resulting in L foot drop, chronic back pain, VT, who is being seen 02/24/2021 for the evaluation of VT requested by Dr. Vivia [name].   Device information MDT dual chamber ICD implanted 08/20/20 Secondary prevention w/hx of hemodynamically unstable MMVT   AAD hx 2018 Pulmonary function studies have shown restriction with low DLCO.  He felt possibly also to have asthma based on nitric oxide testing.  With his lung disease it has been recommended that amiodarone be considered for discontinuance Looks like amio was stopped in 2019 Follows with pulmonology March 2022 mexilletine > recurrent VT higher doses were too expensive June 2022 sotalol started Aug 2022 more VT and mexiletine resumed 01/18/21, VT ablation 02/09/21, device interrogation done for MRI noted VT treated with ATP and mexiletine ordered for increase to 300mg  BID >>>> THIS DID NOT GET TRANSLATED ON DISCHARGE AND remained at 137ms  John [name] has had a long year with recurrent VT and medical issues. He was Hospitalized March, June, July, and Aug 2022 with VT, AAD added and escalated as above.  He was admitted 02/11/21 with L great toe cellulitis and underwent amputation.   Readmitted 02/14/21 with CP, NSTEMI, underwent cath and PCI  (Difficult for successful PTCA/PCI using 3.5 mm ScoreFlex balloon multiple inflations with post dilation of the previously stented) overlapping Onyx Frontier DES 2.75 mm x 22 mm  placed overlapping distally); prox RCA ~50%, prox-distal RCA 35%.  Unfortunately suffered a stroke and also found with a lung nodule.   Finally discharged to rehab 02/18/21   On 02/24/21 in CIR, he woke feeling great, though noted some light palpitations.  No other cardiac awareness.  No CP.  He felt unusually weak with rehab, and staff found his HR elevated. EKG was done noting [facility] and cardiology called to see.   He was in bed, feeling well.  No cardiac awareness, denies any kind of CP, and has not had any CP.  No near syncope or syncope. BP was 101/66, HR 130's   The patient was seen with Dr. Sallyanne Kuster, and via programmer confirmed to be in VT.  His detection rate at 140, noted he had 2 ATPs that were successful overnight last night.   Detection rate was reduced to allow device to run through the ATP cycles, though did not work, given asymptomatic and hemodynamically stable, did not allow HV therapies.   Dr. Sallyanne Kuster was able to break with manual ATP's the last that worked was 345ms.   Note that he  was only getting 150mg  BID   He feels well, says his stroke symptoms are nearly resolved, vision is almost back to normal and reports that he was going to be released to "room privileges" today, meaning he could get OOB on his own apparently.  Hospital Course     Consultants: EP  VT Essentially asymptomatic. VT was terminated by manual ATPs by Dr. Sallyanne Kuster. [name] was increase back to 300 mg BID as was initially planned. Apparently, [name] was not restarted at BID dosing after toe amputation surgery, but was discharged to CIR with BID dosing at 300 mg No changes made to his therapy programming but monitor zones was reduced to 120 bpm. Sotalol was continued.   Device Interrogation: Device was interrogated and reprogrammed to incorporate a VT zone down to 120 bpm to allow for ATP for his slow VT.  With his desire that he not shocked for slow VT; hence, no shocks are in the VT zone   Atrial  flutter Chronic anticoagulation - s/p flutter ablation - continue sotalol, mexiletine, and eliquis   CAD - recent PCI treated with difficult but successful DES x 1 to 100% occlusion of RPA V/PL system on 02/15/21 - continue 30 days of triple therapy with ASA, plavix, and eliquis, then stop ASA - complicated by periprocedural CVA --> discharged to CIR   Ischemic cardiomyopathy ICD in place - continue GDMT with BB, entrestor, and jardiance   Lung nodule Plan for biopsy after ASA has been stopped, in 4-6 weeks. This will require readmission for heparin drip as a bridge, given recent stent. This will need to be coordinated with cardiology.  With enlarging nodule - will likely need biopsy for definitive Dx - unfortunately, he has just suffered 2 major events (MI - physiologically a STEMI although technically only NSTEMI by EKG criteria & Per-Procedural CVA with impressive improvement).  He is currently on Triple AC/Antiplatelet Rx with plans to continue at least 1 month -- then plan to d/c ASA & continue clopidogrel & apixiban. For Bx, would need to hold both agents  -  Apixiban x 48 hr & clopidogrel x 5 days (minimum) -- given recent MI & DES PCI - would be best if we could bridge with GP 2b3a Inhibitor while off of Clopidogrel & IV Heparin vs. Enoxaparin while of of apixiban. -- will discuss options with [facility] team to determine best COA- but would prefer to wait at least 1 month (esp due to CVA   Pt was seen and examined by Dr Caryl Comes and deemed stable for discharge.    Did the patient have an acute coronary syndrome (MI, NSTEMI, STEMI, etc) this admission?:  No                               Did the patient have a percutaneous coronary intervention (stent / angioplasty)?:  No.       _____________  Discharge Vitals Blood pressure 130/78, pulse 70, temperature 97.7 F (36.5 C), temperature source Oral, resp. rate 18, height 6' (1.829 m), weight 95.5 kg, SpO2 97 %.  Filed Weights    02/24/21 1412 02/25/21 0410 02/26/21 0014  Weight: 95.1 kg 94.3 kg 95.5 kg    Labs & Radiologic Studies    CBC Recent Labs    02/24/21 1058  WBC 7.3  HGB 16.2  HCT 49.5  MCV 92.7  PLT 308   Basic Metabolic Panel Recent Labs  02/24/21 1058 02/25/21 0330  NA 140 141  K 4.1 3.6  CL 109 107  CO2 24 24  GLUCOSE 162* 111*  BUN 17 18  CREATININE 1.31* 1.05  CALCIUM 8.7* 8.8*   Liver Function Tests No results for input(s): AST, ALT, ALKPHOS, BILITOT, PROT, ALBUMIN in the last 72 hours. No results for input(s): LIPASE, AMYLASE in the last 72 hours. High Sensitivity Troponin:   Recent Labs  Lab 02/14/21 1727 02/14/21 1927 02/14/21 2237  TROPONINIHS 3,076* 2,458* 2,923*    BNP Invalid input(s): POCBNP D-Dimer No results for input(s): DDIMER in the last 72 hours. Hemoglobin A1C No results for input(s): HGBA1C in the last 72 hours. Fasting Lipid Panel No results for input(s): CHOL, HDL, LDLCALC, TRIG, CHOLHDL, LDLDIRECT in the last 72 hours. Thyroid Function Tests No results for input(s): TSH, T4TOTAL, T3FREE, THYROIDAB in the last 72 hours.  Invalid input(s): FREET3 _____________  CT CHEST WO CONTRAST  Result Date: 02/16/2021 CLINICAL DATA:  Lung nodule EXAM: CT CHEST WITHOUT CONTRAST TECHNIQUE: Multidetector CT imaging of the chest was performed following the standard protocol without IV contrast. COMPARISON:  08/15/2020 FINDINGS: Cardiovascular: Left fibrillator noted with proximal and distal leads in the right atrium and right ventricle, respectively. Coronary, aortic arch, and branch vessel atherosclerotic vascular disease. Mild cardiomegaly with fat density along the lateral wall the left ventricle possibly result of prior myocardial infarction. The ascending thoracic aorta measures 4.2 cm in diameter. Mediastinum/Nodes: Unremarkable Lungs/Pleura: Scattered chronically stable pulmonary nodules up to 0.6 cm in diameter observed. Along the upper margin of a prior  tree-in-bud cluster of nodularity in the left upper lobe there is a dominant pulmonary nodule which has substantially enlarged compared to 07/26/2020, currently measuring 22 by 19 by 19 mm (volume = 4200 mm^3), and previously measuring 6 by 4 by 5 mm (volume = 60 mm^3). Upper Abdomen: Fluid density exophytic lesion from the right kidney upper pole compatible with cyst. Musculoskeletal: Thoracic spondylosis with multilevel bridging spurring. IMPRESSION: 1. Along a cluster of tree-in-bud nodularity in the left upper lobe, a previous 60 cubic mm nodule currently measures 4200 cubic mm, averaging 20 mm in diameter. Although proximity to the tree-in-bud nodularity raises the possibility that this represents atypical infection such as MAI, the nodule growth is clearly different from the other nodules and raises the possibility of malignancy. Tissue diagnosis is recommended when feasible. 2. Ascending thoracic aorta measures 4.2 cm in diameter, stable by my measurements. Recommend annual imaging followup by CTA or MRA. This recommendation follows 2010 ACCF/AHA/AATS/ACR/ASA/SCA/SCAI/SIR/STS/SVM Guidelines for the Diagnosis and Management of Patients with Thoracic Aortic Disease. Circulation. 2010; 121: M578-I696. Aortic aneurysm NOS (ICD10-I71.9) 3. Other imaging findings of potential clinical significance: Aortic Atherosclerosis (ICD10-I70.0). Coronary atherosclerosis. Mild cardiomegaly. Electronically Signed   By: Van Clines M.D.   On: 02/16/2021 18:57   MR BRAIN WO CONTRAST  Result Date: 02/16/2021 CLINICAL DATA:  Acute neuro deficit.  Suspect stroke. EXAM: MRI HEAD WITHOUT CONTRAST TECHNIQUE: Multiplanar, multiecho pulse sequences of the brain and surrounding structures were obtained without intravenous contrast. COMPARISON:  CT head 02/15/2021 FINDINGS: Brain: Acute infarct right medial thalamus measuring approximately 10 x 15 mm. Small areas of acute infarct in the frontal cortex bilaterally over the  convexity and in the right parietal white matter. Mild atrophy without hydrocephalus. Negative for hemorrhage or mass. Vascular: Normal arterial flow voids Skull and upper cervical spine: No focal skeletal lesion. Sinuses/Orbits: Mucosal edema paranasal sinuses. Retention cyst right maxillary sinus. Bilateral cataract surgery Other: None  IMPRESSION: Acute infarct right thalamus. Small punctate areas of acute infarct in the frontal lobes bilaterally and right parietal white matter. Findings suggest cerebral emboli. Electronically Signed   By: Franchot Gallo M.D.   On: 02/16/2021 13:25   CARDIAC CATHETERIZATION  Result Date: 02/15/2021   1st Mrg lesion is 55% stenosed.   Prox RCA lesion is 50% stenosed.   Prox RCA to Dist RCA lesion is 35% stenosed.   RPAV-1 lesion is 100% stenosed.   RPAV-2 lesion is 80% stenosed.   A stent was successfully placed.   Post intervention, there is a 0% residual stenosis.   Post intervention, there is a 0% residual stenosis. Acute coronary syndrome secondary to total occlusion of a large distal right coronary artery at a site of prior distal stenting. The LAD has mild irregularity without significant disease.  The large ramus intermediate vessel has previously noted 55% very proximal stenosis not significantly changed.  The AV groove circumflex is a small vessel. The RCA is a very large vessel with diffuse stents proximally to very distally.  There is proximal 50% stenosis prior to the proximal to mid stent.  There is diffuse 35% narrowing within the long proximal stent.  The distal stent is totally occluded just after PDA vessel. Difficult but successful PCI requiring PTCA, a 3.5 x 15 mm Scorflex multiple dilatations throughout the previously placed tandem 3.5 mm  distal stents with ultimate noncompliant balloon dilatation to approximately 3.6 mm and insertion of a 2.75 x 22 mm Onyx Frontier new stent distal to the distal stent placed in overlap 75 to 80% stenosis beyond the stented  segment, postdilated with a 3 oh balloon with residual narrowing of 0%. LVEDP 18 mmHg RECOMMENDATION: Initial triple drug therapy with plan discontinuance of aspirin and continuation of Plavix/for minimum of 6 to 12 months.  Near the completion of the procedure, the patient appeared slightly less responsive.  Blood sugar was obtained which was 63.  He was given one half amp of D50.  After the procedure, a subtle left facial droop developed and there appeared to be  left arm weakness..  A code stroke was activated and the patient was seen immediately by neurology and sent for head CT for further evaluation.   MR FOOT LEFT WO CONTRAST  Result Date: 02/09/2021 CLINICAL DATA:  Foot swelling, diabetic, osteomyelitis suspected, xray done EXAM: MRI OF THE LEFT FOOT WITHOUT CONTRAST TECHNIQUE: Multiplanar, multisequence MR imaging of the left foot was performed. No intravenous contrast was administered. COMPARISON:  Radiograph 02/08/2021 FINDINGS: Bones/Joint/Cartilage There is increased T2 signal with confluent low T1 signal in the middle and distal phalanx of the second toe. Hallux valgus with moderate first MTP degenerative change. Ligaments Intact Lisfranc ligament. Muscles and Tendons Diffuse muscle atrophy in the foot. Diffuse intramuscular edema as is commonly seen in diabetics. Soft tissues No soft tissue mass or focal fluid collection. Generalized soft tissue swelling of the foot, most prominent dorsally. IMPRESSION: Osteomyelitis of the second toe distal and middle phalanx with adjacent soft tissue ulcer. No evidence of soft tissue abscess. Electronically Signed   By: Maurine Simmering M.D.   On: 02/09/2021 14:17   DG Chest Portable 1 View  Result Date: 02/14/2021 CLINICAL DATA:  Acute chest pain EXAM: PORTABLE CHEST 1 VIEW COMPARISON:  01/26/2021, CT 07/26/2020, 01/17/2021 FINDINGS: Left-sided pacing device as before. Mild cardiomegaly without focal opacity, pleural effusion, or pneumothorax. Previously noted  left midlung nodule for which chest CT was recommended is poorly visible on  current exam. IMPRESSION: No active disease. Mild cardiomegaly. Previously noted left midlung nodule for which chest CT was recommended is poorly visible today likely due to technique and overlying pacer generator, CT follow-up again recommended. Electronically Signed   By: Donavan Foil M.D.   On: 02/14/2021 18:17   DG Foot Complete Left  Result Date: 02/10/2021 CLINICAL DATA:  Osteomyelitis, left second digit amputation EXAM: LEFT FOOT - COMPLETE 3+ VIEW COMPARISON:  02/08/2021 FINDINGS: Interval amputation of the left second digit distal to the MTP joint. Moderate hallux valgus deformity again identified with associated degenerative change of the MTP joint. No acute fracture or dislocation. No osseous erosions. Moderate midfoot degenerative arthritis noted. Moderate soft tissue swelling of the left forefoot noted. IMPRESSION: Interval left second digit amputation distal to the MTP joint. Associated soft tissue swelling. Electronically Signed   By: Fidela Salisbury M.D.   On: 02/10/2021 20:26   DG Foot Complete Left  Result Date: 02/08/2021 CLINICAL DATA:  Pain and edema EXAM: LEFT FOOT - COMPLETE 3+ VIEW COMPARISON:  None. FINDINGS: No acute fracture or dislocation. Changes of the midfoot and IP joints. No destructive osseous changes to suggest osteomyelitis. Soft tissue swelling of the forefoot IMPRESSION: No evidence of osteomyelitis. Electronically Signed   By: Yetta Glassman M.D.   On: 02/08/2021 13:54   DG Foot Complete Left  Result Date: 02/04/2021 Please see detailed radiograph report in office note.  VAS [place] ABI WITH/WO TBI  Result Date: 02/09/2021  LOWER EXTREMITY DOPPLER STUDY Patient Name:  John [name]  Date of Exam:   02/09/2021 Medical Rec #: [id number]         Accession #:    [id number] Date of Birth: [birth year]         Patient Gender: M Patient Age:   11 years [facility]am [facility] Procedure:       VAS [place] ABI WITH/WO TBI Referring Phys: Jennette Kettle --------------------------------------------------------------------------------  Indications: Ulceration LT 2nd toe, worsening cellulitis. High Risk Factors: Hypertension, hyperlipidemia, Diabetes, past history of                    smoking, prior MI, coronary artery disease.  Comparison Study: No prior studies. Performing Technologist: Darlin Coco RDMS RVT  Examination Guidelines: A complete evaluation includes at minimum, Doppler waveform signals and systolic blood pressure reading at the level of bilateral brachial, anterior tibial, and posterior tibial arteries, when vessel segments are accessible. Bilateral testing is considered an integral part of a complete examination. Photoelectric Plethysmograph (PPG) waveforms and toe systolic pressure readings are included as required and additional duplex testing as needed. Limited examinations for reoccurring indications may be performed as noted.  ABI Findings: +---------+------------------+-----+---------+--------+ Right    Rt Pressure (mmHg)IndexWaveform Comment  +---------+------------------+-----+---------+--------+ Brachial 124                    triphasic         +---------+------------------+-----+---------+--------+ PTA      158               1.26 triphasic         +---------+------------------+-----+---------+--------+ DP       145               1.16 triphasic         +---------+------------------+-----+---------+--------+ Great Toe78                0.62 Abnormal          +---------+------------------+-----+---------+--------+ +---------+------------------+-----+---------+-------+  Left     Lt Pressure (mmHg)IndexWaveform Comment +---------+------------------+-----+---------+-------+ Brachial 125                    triphasic        +---------+------------------+-----+---------+-------+ PTA      139               1.11 triphasic         +---------+------------------+-----+---------+-------+ DP       149               1.19 triphasic        +---------+------------------+-----+---------+-------+ Great Toe88                0.70 Normal           +---------+------------------+-----+---------+-------+  Summary: Right: Resting right ankle-brachial index is within normal range. No evidence of significant right lower extremity arterial disease. The right toe-brachial index is abnormal. Left: Resting left ankle-brachial index is within normal range. No evidence of significant left lower extremity arterial disease. The left toe-brachial index is normal.  *See table(s) above for measurements and observations.  Electronically signed by Harold Barban MD on 02/09/2021 at 8:07:27 PM.    Final    ECHOCARDIOGRAM COMPLETE  Result Date: 02/15/2021    ECHOCARDIOGRAM REPORT   Patient Name:   John [name] Date of Exam: 02/15/2021 Medical Rec #:  [id number]        Height:       72.0 in Accession #:    [id number]       Weight:       217.0 lb Date of Birth:  [birth year]        BSA:          2.206 m Patient Age:    60 years         BP:           119/91 mmHg Patient Gender: M                HR:           72 bpm. Exam Location:  Inpatient Procedure: 2D Echo, Cardiac Doppler and Color Doppler Indications:    chest pain  History:        Patient has prior history of Echocardiogram examinations, most                 recent 01/18/2021. CAD and Previous Myocardial Infarction,                 Arrythmias:Atrial Flutter, Signs/Symptoms:Shortness of Breath;                 Risk Factors:Diabetes, Dyslipidemia and Hypertension. 02/27/2012                 left heart cath                 07/28/2016 cardionversion                 03/02/2020 left heasrt cath                 08/19/20 left heart cath.  Sonographer:    Luisa Hart RDCS Referring Phys: [id number] [place]  1. Left ventricular ejection fraction, by estimation, is 40 to 45%. The left ventricle has mildly  decreased function. The left ventricle demonstrates regional wall motion abnormalities (see scoring diagram/findings for description). The left ventricular  internal cavity size was moderately dilated. Left ventricular diastolic parameters  are consistent with Grade I diastolic dysfunction (impaired relaxation).  2. Right ventricular systolic function is normal. The right ventricular size is normal. There is normal pulmonary artery systolic pressure.  3. Left atrial size was mildly dilated.  4. The mitral valve is normal in structure. Trivial mitral valve regurgitation. No evidence of mitral stenosis.  5. The aortic valve is normal in structure. Aortic valve regurgitation is not visualized.  6. Aortic dilatation noted. There is borderline dilatation of the aortic root, measuring 38 mm. Comparison(s): Prior images reviewed side by side. The left ventricular function is unchanged. The left ventricular wall motion abnormalities are worse, although the general distribution is similar. There seems to be more some worsening of contractility in the more distal portion of the inferior and inferoseptal walls, with sparing of the true apex and without meaningful change in overall LVEF. FINDINGS  Left Ventricle: Left ventricular ejection fraction, by estimation, is 40 to 45%. The left ventricle has mildly decreased function. The left ventricle demonstrates regional wall motion abnormalities. The left ventricular internal cavity size was moderately dilated. There is no left ventricular hypertrophy. Left ventricular diastolic parameters are consistent with Grade I diastolic dysfunction (impaired relaxation). Normal left ventricular filling pressure.  LV Wall Scoring: The basal inferolateral segment and basal inferior segment are dyskinetic. The mid and distal inferior wall, mid inferolateral segment, and basal inferoseptal segment are akinetic. The mid inferoseptal segment is hypokinetic. The entire anterior wall, antero-lateral  wall, entire anterior septum, apical lateral segment, and apex are normal. Right Ventricle: The right ventricular size is normal. No increase in right ventricular wall thickness. Right ventricular systolic function is normal. There is normal pulmonary artery systolic pressure. The tricuspid regurgitant velocity is 1.97 m/s, and  with an assumed right atrial pressure of 3 mmHg, the estimated right ventricular systolic pressure is 95.1 mmHg. Left Atrium: Left atrial size was mildly dilated. Right Atrium: Right atrial size was normal in size. Pericardium: There is no evidence of pericardial effusion. Mitral Valve: The mitral valve is normal in structure. Trivial mitral valve regurgitation. No evidence of mitral valve stenosis. Tricuspid Valve: The tricuspid valve is normal in structure. Tricuspid valve regurgitation is trivial. Aortic Valve: The aortic valve is normal in structure. Aortic valve regurgitation is not visualized. Aortic valve mean gradient measures 3.0 mmHg. Aortic valve peak gradient measures 4.5 mmHg. Aortic valve area, by VTI measures 5.19 cm. Pulmonic Valve: The pulmonic valve was normal in structure. Pulmonic valve regurgitation is not visualized. Aorta: Aortic dilatation noted. There is borderline dilatation of the aortic root, measuring 38 mm. IAS/Shunts: No atrial level shunt detected by color flow Doppler. Additional Comments: A device lead is visualized in the right ventricle.  LEFT VENTRICLE PLAX 2D LVIDd:         6.20 cm      Diastology LVIDs:         5.70 cm      LV e' medial:    4.03 cm/s LV PW:         0.80 cm      LV E/e' medial:  13.5 LV IVS:        1.00 cm      LV e' lateral:   4.38 cm/s LVOT diam:     2.80 cm      LV E/e' lateral: 12.4 LV SV:         106 LV SV Index:   48 LVOT Area:     6.16 cm  LV Volumes (MOD) LV  vol d, MOD A2C: 112.0 ml LV vol d, MOD A4C: 142.0 ml LV vol s, MOD A2C: 75.1 ml LV vol s, MOD A4C: 71.1 ml LV SV MOD A2C:     36.9 ml LV SV MOD A4C:     142.0 ml LV SV MOD  BP:      53.3 ml RIGHT VENTRICLE RV Basal diam:  2.60 cm RV Mid diam:    1.30 cm RV S prime:     11.00 cm/s TAPSE (M-mode): 1.7 cm LEFT ATRIUM             Index       RIGHT ATRIUM           Index LA diam:        3.80 cm 1.72 cm/m  RA Area:     16.00 cm LA Vol (A2C):   43.4 ml 19.68 ml/m RA Volume:   39.80 ml  18.04 ml/m LA Vol (A4C):   52.6 ml 23.85 ml/m LA Biplane Vol: 47.5 ml 21.53 ml/m  AORTIC VALVE                   PULMONIC VALVE AV Area (Vmax):    5.23 cm    PV Vmax:       0.79 m/s AV Area (Vmean):   4.94 cm    PV Vmean:      57.700 cm/s AV Area (VTI):     5.19 cm    PV VTI:        0.148 m AV Vmax:           106.00 cm/s PV Peak grad:  2.5 mmHg AV Vmean:          77.500 cm/s PV Mean grad:  2.0 mmHg AV VTI:            0.204 m AV Peak Grad:      4.5 mmHg AV Mean Grad:      3.0 mmHg LVOT Vmax:         90.10 cm/s LVOT Vmean:        62.200 cm/s LVOT VTI:          0.172 m LVOT/AV VTI ratio: 0.84  AORTA Ao Root diam: 3.80 cm MITRAL VALVE               TRICUSPID VALVE MV Area (PHT): 2.53 cm    TR Peak grad:   15.5 mmHg MV Decel Time: 300 msec    TR Vmax:        197.00 cm/s MV E velocity: 54.40 cm/s MV A velocity: 82.70 cm/s  SHUNTS MV E/A ratio:  0.66        Systemic VTI:  0.17 m                            Systemic Diam: 2.80 cm Dani Gobble Croitoru MD Electronically signed by Sanda Klein MD Signature Date/Time: 02/15/2021/10:55:04 AM    Final    CUP PACEART INCLINIC DEVICE CHECK  Result Date: 02/01/2021 ICD check in clinic. Normal device function. Thresholds and sensing consistent with previous device measurements. Impedance trends stable over time. No mode switches. No new ventricular arrhythmias. Histogram distribution appropriate for patient and level of activity. No changes made this session. Device programmed at appropriate safety margins. Device programmed to optimize intrinsic conduction. Estimated longevity 9 yr, 53mo. Pt enrolled in remote follow-up. Patient education completed  CT HEAD CODE STROKE  WO CONTRAST  Result Date: 02/15/2021 CLINICAL DATA:  Code stroke.  Neuro deficit, acute, stroke suspected EXAM: CT HEAD WITHOUT CONTRAST TECHNIQUE: Contiguous axial images were obtained from the base of the skull through the vertex without intravenous contrast. COMPARISON:  None. FINDINGS: Brain: No evidence of acute large vascular territory infarction, hemorrhage, hydrocephalus, extra-axial collection or mass lesion/mass effect. Intracranial enhancement is noted, likely related to recent cardiac catheterization. Vascular: Contrast within vessels, likely related to recent cardiac catheterization. Skull: No acute fracture. Sinuses/Orbits: No acute findings. Other: No mastoid effusion. ASPECTS [facility] Stroke Program Early CT Score) Total score (0-10 with 10 being normal): 10. IMPRESSION: No evidence of acute intracranial abnormality.  ASPECTS is 10. Code stroke imaging results were communicated on 02/15/2021 at 6:47 pm to provider [facility] via secure text paging. Electronically Signed   By: Margaretha Sheffield M.D.   On: 02/15/2021 18:48   CT ANGIO HEAD NECK W WO CM W PERF (CODE STROKE)  Result Date: 02/15/2021 CLINICAL DATA:  Left-sided neglect EXAM: CT ANGIOGRAPHY HEAD AND NECK TECHNIQUE: Multidetector CT imaging of the head and neck was performed using the standard protocol during bolus administration of intravenous contrast. Multiplanar CT image reconstructions and MIPs were obtained to evaluate the vascular anatomy. Carotid stenosis measurements (when applicable) are obtained utilizing NASCET criteria, using the distal internal carotid diameter as the denominator. CONTRAST:  186mL OMNIPAQUE IOHEXOL 350 MG/ML SOLN COMPARISON:  Same day noncontrast head CT, brain MRI 11/02/2014 FINDINGS: CTA NECK FINDINGS Aortic arch: There is minimal calcified atherosclerotic plaque of the aortic arch and at the origins of the great vessels. Right carotid system: No evidence of dissection, stenosis (50% or greater) or occlusion.  Left carotid system: No evidence of dissection, stenosis (50% or greater) or occlusion. Vertebral arteries: Codominant. No evidence of dissection, stenosis (50% or greater) or occlusion. Skeleton: There is multilevel degenerative change of the cervical spine. There is no acute osseous abnormality or aggressive osseous lesion. Other neck: There is a right maxillary sinus mucous retention cyst. Upper chest: There is mosaic attenuation in the lung apices with interlobular septal thickening. There is a 1.6 cm lesion in the left upper lobe, incompletely imaged. Review of the MIP images confirms the above findings CTA HEAD FINDINGS Anterior circulation: There is mild calcification of the cavernous ICAs without hemodynamically significant stenosis or occlusion. The bilateral ACAs and MCAs are patent. There is no aneurysm. Posterior circulation: The V4 segments of the vertebral arteries are patent. The basilar artery is patent. The PCAs are patent. Venous sinuses: As permitted by contrast timing, patent. Anatomic variants: None. Review of the MIP images confirms the above findings IMPRESSION: 1. Patent vasculature of the head and neck with no hemodynamically significant stenosis, occlusion, dissection, or aneurysm. 2. Soft tissue lesion in the left upper lobe concerning for malignancy, incompletely imaged. Recommend dedicated CT chest. 3. Mild pulmonary interstitial edema and mosaic attenuation suggesting small airway disease. Electronically Signed   By: Valetta Mole M.D.   On: 02/15/2021 19:04   Disposition   Pt is being discharged home today in good condition.  Follow-up Plans & Appointments     Follow-up Information     [facility] Follow up.   Specialty: Rehabilitation Why: They will  contact you next week for apt times Contact information: [address] [id number] Prudy Feeler [place]        John Frizzle, MD Follow up on  03/03/2021.   Specialty: Family Medicine Why: @3 :45pm Contact information:  [address] [place] [address] [phone]         Troy Sine, MD .   Specialty: Cardiology Contact information: [address] [place] [address] [phone]         John Haw, MD .   Specialty: Cardiology Contact information: [address] [place] [address] [phone]         Ledora Bottcher, [place] Follow up on 03/07/2021.   Specialties: Physician Assistant, Cardiology, Radiology Why: 2:45 pm Contact information: [address] [place] [address] [phone]                Discharge Instructions     Diet - low sodium heart healthy   Complete by: As directed    Increase activity slowly   Complete by: As directed    No wound care   Complete by: As directed        Discharge Medications   Allergies as of 02/26/2021       Reactions   Omega-3 Fatty Acids Hives, Itching   Benazepril Other (See Comments)   hyperkalemia   Fish Allergy Itching        Medication List     TAKE these medications    acetaminophen 500 MG tablet Commonly known as: TYLENOL Take 500 mg by mouth every 6 (six) hours as needed for moderate pain.   albuterol 108 (90 Base) MCG/ACT inhaler Commonly known as: VENTOLIN HFA Inhale 2 puffs into the lungs every 6 (six) hours as needed for wheezing or shortness of breath.   Arnuity Ellipta 200 MCG/ACT Aepb Generic drug: Fluticasone Furoate Inhale 1 puff into the lungs daily.   aspirin 81 MG EC tablet Take 1 tablet (81 mg total) by mouth daily. Swallow whole.   clopidogrel 75 MG tablet Commonly known as: PLAVIX Take 1 tablet (75 mg total) by mouth daily with breakfast.   Eliquis 5 MG Tabs tablet Generic drug: apixaban TAKE 1 TABLET BY MOUTH TWICE A DAY What changed: how much to take   Entresto 24-26 MG Generic drug: sacubitril-valsartan TAKE 1 TABLET BY MOUTH  TWICE A DAY   HYDROcodone-acetaminophen 5-325 MG tablet Commonly known as: NORCO/VICODIN Take 1 tablet by mouth every 6 (six) hours as needed for severe pain.   Jardiance 25 MG Tabs tablet Generic drug: empagliflozin TAKE 1 TABLET BY MOUTH EVERY DAY What changed: how much to take   ketoconazole 2 % shampoo Commonly known as: NIZORAL Apply 1 application topically 2 (two) times a week.   Levemir FlexTouch 100 UNIT/ML FlexPen Generic drug: insulin detemir INJECT 46 UNITS INTO THE SKIN DAILY. DX:E11.9 What changed: See the new instructions.   metoprolol succinate 100 MG 24 hr tablet Commonly known as: TOPROL-XL Take 1 tablet (100 mg total) by mouth daily. Take with or immediately following a meal. What changed: additional instructions   mexiletine 150 MG capsule Commonly known as: MEXITIL Take 2 capsules (300 mg total) by mouth every 12 (twelve) hours. What changed:  how much to take when to take this   montelukast 10 MG tablet Commonly known as: SINGULAIR TAKE 1 TABLET BY MOUTH EVERY DAY   nitroGLYCERIN 0.4 MG SL tablet Commonly known as: NITROSTAT PLACE 1 TABLET (0.4 MG TOTAL) UNDER THE TONGUE EVERY 5 (FIVE) MINUTES AS NEEDED FOR CHEST PAIN.   NovoFine 32G X 6 MM Misc Generic drug: Insulin Pen Needle 1 each by Other route daily.   NovoLOG FlexPen  100 UNIT/ML FlexPen Generic drug: insulin aspart INJECT 5-20 UNITS SUBCUTANEOUSLY WITH LUNCH AND DINNER What changed:  how much to take how to take this when to take this reasons to take this additional instructions   OneTouch Ultra test strip Generic drug: glucose blood USE TO CHECK BLOOD SUGAR 3 TIMES A DAY (E11.9)   rosuvastatin 40 MG tablet Commonly known as: CRESTOR Take 1 tablet (40 mg total) by mouth daily.   sotalol 160 MG tablet Commonly known as: BETAPACE Take 1 tablet (160 mg total) by mouth 2 (two) times daily.           Outstanding Labs/Studies   K supplemented on the day of  discharge  Check BMP in 1 week  Duration of Discharge Encounter   Greater than 30 minutes including physician time.  Signed, [place], PA 02/26/2021, 10:04 AM

## 2021-02-27 ENCOUNTER — Other Ambulatory Visit: Payer: Self-pay | Admitting: Physician Assistant

## 2021-02-27 DIAGNOSIS — I2510 Atherosclerotic heart disease of native coronary artery without angina pectoris: Secondary | ICD-10-CM

## 2021-02-27 MED ORDER — CLOPIDOGREL BISULFATE 75 MG PO TABS: 75.0000 mg | ORAL_TABLET | Freq: Every day | ORAL | 3 refills | Status: DC

## 2021-02-28 ENCOUNTER — Other Ambulatory Visit: Payer: Self-pay

## 2021-02-28 ENCOUNTER — Telehealth: Payer: Self-pay

## 2021-02-28 DIAGNOSIS — I10 Essential (primary) hypertension: Secondary | ICD-10-CM

## 2021-02-28 DIAGNOSIS — J418 Mixed simple and mucopurulent chronic bronchitis: Secondary | ICD-10-CM

## 2021-02-28 DIAGNOSIS — I214 Non-ST elevation (NSTEMI) myocardial infarction: Secondary | ICD-10-CM

## 2021-02-28 DIAGNOSIS — I63139 Cerebral infarction due to embolism of unspecified carotid artery: Secondary | ICD-10-CM

## 2021-02-28 NOTE — Telephone Encounter (Signed)
Transition Care Management Unsuccessful Follow-up Telephone Call  Date of discharge and from where:  [facility] 02/26/2021  Diagnosis: CAD, DM. HTN   Attempts:  1st Attempt  Reason for unsuccessful TCM follow-up call:  Left voice message

## 2021-03-01 NOTE — Telephone Encounter (Signed)
Transition Care Management Unsuccessful Follow-up Telephone Call  Date of discharge and from where:  02/26/2021, [place]  Diagnosis: Ventricular Tachycardia   Attempts:  2nd Attempt  Reason for unsuccessful TCM follow-up call:  No answer/busy. I did also try pt's wife, Betty's number. LM on her cell phone.

## 2021-03-02 ENCOUNTER — Ambulatory Visit (INDEPENDENT_AMBULATORY_CARE_PROVIDER_SITE_OTHER): Payer: Medicare Other | Admitting: Podiatry

## 2021-03-02 ENCOUNTER — Ambulatory Visit (INDEPENDENT_AMBULATORY_CARE_PROVIDER_SITE_OTHER): Payer: Medicare Other

## 2021-03-02 ENCOUNTER — Telehealth: Payer: Self-pay | Admitting: *Deleted

## 2021-03-02 ENCOUNTER — Other Ambulatory Visit: Payer: Self-pay

## 2021-03-02 DIAGNOSIS — L97522 Non-pressure chronic ulcer of other part of left foot with fat layer exposed: Secondary | ICD-10-CM

## 2021-03-02 DIAGNOSIS — Z9889 Other specified postprocedural states: Secondary | ICD-10-CM | POA: Diagnosis not present

## 2021-03-02 NOTE — Progress Notes (Signed)
   Subjective:  Patient presents today status post right second toe amputation. DOS: 02/10/2021.  Patient states that he is doing very well.  He did spend some time in the hospital postoperatively for stroke and heart attack.  Patient is now discharged and doing very well.  He says that his foot is feeling well and there are no new complaints at this time  Past Medical History:  Diagnosis Date   Atrial flutter ([place])    s/p cardioversion   Coronary artery disease    Diabetes mellitus    GERD (gastroesophageal reflux disease)    History of nuclear stress test 04/04/2011   lexiscan; mod-large in size fixed inferolateral defect (scar); non-diagnostic for ischemia; low risk scan    Hyperlipidemia    Hypertension    Left foot drop    r/t past disk srugery - uses Kevlar brace   Myocardial infarction [facility])    posterior MI   Shortness of breath    Sleep apnea    on CPAP; 04/28/2007 split-night - AHI during total sleep 44.43/hr and REM 72.56/hr      Objective/Physical Exam Neurovascular status intact.  Skin incisions appear to be well coapted with sutures  intact. No sign of infectious process noted. No dehiscence. No active bleeding noted. Moderate edema noted to the surgical extremity.  Assessment: 1. s/p right second toe amputation. DOS: 02/10/2021   Plan of Care:  1. Patient was evaluated.  2.  Sutures removed today 3.  Patient may resume regular shoes and full activity no restrictions 4.  Return to clinic neck scheduled appointment for routine foot care with Dr. Alcide Evener, DPM [facility]  Dr. Edrick Kins, DPM    [address], [place] [address]                Office [phone]  Fax [phone]

## 2021-03-02 NOTE — Telephone Encounter (Signed)
Transition Care Management Follow-up Telephone Call Date of discharge and from where: 02/26/2021 from [facility]. Diagnosis: Ventricular Tachycardia How have you been since you were released from the hospital? Pt states he is doing, "real good." Any questions or concerns? No  Items Reviewed: Did the pt receive and understand the discharge instructions provided? Yes  Medications obtained and verified? Yes  Other? No  Any new allergies since your discharge? No  Dietary orders reviewed? Yes Do you have support at home? Yes   Home Care and Equipment/Supplies: Were home health services ordered? no If so, what is the name of the agency? N/A  Has the agency set up a time to come to the patient's home? not applicable Were any new equipment or medical supplies ordered?  Yes: Pt was discharged home with a walker. What is the name of the medical supply agency? N/A Were you able to get the supplies/equipment? yes Do you have any questions related to the use of the equipment or supplies? No  Functional Questionnaire: (I = Independent and D = Dependent) ADLs: I  Bathing/Dressing- I  Meal Prep- I  Eating- I  Maintaining continence- I  Transferring/Ambulation- D. With walker.  Managing Meds- I  Follow up appointments reviewed:  PCP Hospital f/u appt confirmed? Yes  Scheduled to see [name] on 03/03/21 @ 3:45. [facility] f/u appt confirmed? Yes  Scheduled to see Fabian Sharp PA on 03/07/21 @ 2"45. Are transportation arrangements needed? No  If their condition worsens, is the pt aware to call PCP or go to the Emergency Dept.? Yes Was the patient provided with contact information for the PCP's office or ED? Yes Was to pt encouraged to call back with questions or concerns? Yes

## 2021-03-02 NOTE — Chronic Care Management (AMB) (Signed)
  Chronic Care Management   Note  03/02/2021 Name: John [name] MRN: [id number] DOB: March 05, 1951  John [name] is a 70 y.o. year old male who is a primary care patient of Dennard Schaumann, [name], MD. John [name] is currently enrolled in care management services. An additional referral for RNCM was placed.   [name] was given information about Chronic Care Management services today including:  CCM service includes personalized support from designated clinical staff supervised by his physician, including individualized plan of care and coordination with other care providers 24/7 contact phone numbers for assistance for urgent and routine care needs. Service will only be billed when office clinical staff spend 20 minutes or more in a month to coordinate care. Only one practitioner may furnish and bill the service in a calendar month. The patient may stop CCM services at any time (effective at the end of the month) by phone call to the office staff. The patient will be responsible for cost sharing (co-pay) of up to 20% of the service fee (after annual deductible is met).  Patient agreed to services and verbal consent obtained.    Follow up plan: Telephone appointment with care management team member scheduled for:03/11/21  [facility]  Direct Dial: [phone]

## 2021-03-03 ENCOUNTER — Encounter: Payer: Self-pay | Admitting: Family Medicine

## 2021-03-03 ENCOUNTER — Other Ambulatory Visit: Payer: Self-pay

## 2021-03-03 ENCOUNTER — Ambulatory Visit (INDEPENDENT_AMBULATORY_CARE_PROVIDER_SITE_OTHER): Payer: Medicare Other | Admitting: Family Medicine

## 2021-03-03 VITALS — BP 138/86 | HR 96 | Temp 97.9°F | Resp 16 | Ht 72.0 in | Wt 210.0 lb

## 2021-03-03 DIAGNOSIS — I2510 Atherosclerotic heart disease of native coronary artery without angina pectoris: Secondary | ICD-10-CM

## 2021-03-03 DIAGNOSIS — I214 Non-ST elevation (NSTEMI) myocardial infarction: Secondary | ICD-10-CM | POA: Diagnosis not present

## 2021-03-03 DIAGNOSIS — E1165 Type 2 diabetes mellitus with hyperglycemia: Secondary | ICD-10-CM | POA: Diagnosis not present

## 2021-03-03 DIAGNOSIS — I472 Ventricular tachycardia, unspecified: Secondary | ICD-10-CM

## 2021-03-03 DIAGNOSIS — Z8673 Personal history of transient ischemic attack (TIA), and cerebral infarction without residual deficits: Secondary | ICD-10-CM

## 2021-03-03 NOTE — Progress Notes (Signed)
Subjective:    Patient ID: BREYSON [name], male    DOB: [birth year], 70 y.o.   MRN: [id number]  Admit date: 02/24/2021 Discharge date: 02/26/2021   PCP:  [name], MD              [facility] Cardiologist:  Shelva Majestic, MD  Electrophysiologist:  Constance Haw, MD  {   Discharge Diagnoses    Principal Problem:   Ventricular tachycardia [facility]) Active Problems:   CAD, multiple prior RCA PCI's. Last cath 2010, Myoview low risk Oct 2012   DM type 2, goal HbA1c < 7% (HCC)   HTN (hypertension)   COPD (chronic obstructive pulmonary disease) (HCC)   GERD (gastroesophageal reflux disease)   OSA on CPAP   Hyperlipidemia LDL goal <70   Anticoagulation adequate   Thoracic aortic aneurysm (HCC)   Chronic systolic CHF (congestive heart failure) ([place])   Embolic stroke [facility])       Diagnostic Studies/Procedures    02/15/21: LHC/PCI   1st Mrg lesion is 55% stenosed.   Prox RCA lesion is 50% stenosed.   Prox RCA to Dist RCA lesion is 35% stenosed.   RPAV-1 lesion is 100% stenosed.   RPAV-2 lesion is 80% stenosed.   A stent was successfully placed.   Post intervention, there is a 0% residual stenosis.   Post intervention, there is a 0% residual stenosis.   Acute coronary syndrome secondary to total occlusion of a large distal right coronary artery at a site of prior distal stenting.   The LAD has mild irregularity without significant disease.  The large ramus intermediate vessel has previously noted 55% very proximal stenosis not significantly changed.  The AV groove circumflex is a small vessel.   The RCA is a very large vessel with diffuse stents proximally to very distally.  There is proximal 50% stenosis prior to the proximal to mid stent.  There is diffuse 35% narrowing within the long proximal stent.  The distal stent is totally occluded just after PDA vessel.   Difficult but successful PCI requiring PTCA, a 3.5 x 15 mm Scorflex multiple dilatations  throughout the previously placed tandem 3.5 mm  distal stents with ultimate noncompliant balloon dilatation to approximately 3.6 mm and insertion of a 2.75 x 22 mm Onyx Frontier new stent distal to the distal stent placed in overlap 75 to 80% stenosis beyond the stented segment, postdilated with a 3 oh balloon with residual narrowing of 0%.   LVEDP 18 mmHg   RECOMMENDATION: Initial triple drug therapy with plan discontinuance of aspirin and continuation of Plavix/for minimum of 6 to 12 months.  Near the completion of the procedure, the patient appeared slightly less responsive.  Blood sugar was obtained which was 63.  He was given one half amp of D50.  After the procedure, a subtle left facial droop developed and there appeared to be  left arm weakness..  A code stroke was activated and the patient was seen immediately by neurology and sent for head CT for further evaluation.     02/15/21: TTE IMPRESSIONS   1. Left ventricular ejection fraction, by estimation, is 40 to 45%. The  left ventricle has mildly decreased function. The left ventricle  demonstrates regional wall motion abnormalities (see scoring  diagram/findings for description). The left ventricular   internal cavity size was moderately dilated. Left ventricular diastolic  parameters are consistent with Grade I diastolic dysfunction (impaired  relaxation).   2. Right ventricular systolic function  is normal. The right ventricular  size is normal. There is normal pulmonary artery systolic pressure.   3. Left atrial size was mildly dilated.   4. The mitral valve is normal in structure. Trivial mitral valve  regurgitation. No evidence of mitral stenosis.   5. The aortic valve is normal in structure. Aortic valve regurgitation is  not visualized.   6. Aortic dilatation noted. There is borderline dilatation of the aortic  root, measuring 38 mm.   Comparison(s): Prior images reviewed side by side. The left ventricular  function is  unchanged. The left ventricular wall motion abnormalities are  worse, although the general distribution is similar. There seems to be  more some worsening of contractility  in the more distal portion of the inferior and inferoseptal walls, with  sparing of the true apex and without meaningful change in overall LVEF.   _____________   History of Present Illness     John [name] is a 70 y.o. male with a hx of CAD(multiple prior PCIs reported in the RCA and Cx, last stenting 02/15/21), ICM, HTN, HLD, DM, OSA (w/CPAP), TAA, ILD, quit smoking 2019, AFlutter, back surgery resulting in L foot drop, chronic back pain, VT, who is being seen 02/24/2021 for the evaluation of VT requested by Dr. Vivia Ewing.   Device information MDT dual chamber ICD implanted 08/20/20 Secondary prevention w/hx of hemodynamically unstable MMVT   AAD hx 2018 Pulmonary function studies have shown restriction with low DLCO.  He felt possibly also to have asthma based on nitric oxide testing.  With his lung disease it has been recommended that amiodarone be considered for discontinuance Looks like amio was stopped in 2019 Follows with pulmonology March 2022 mexilletine > recurrent VT higher doses were too expensive June 2022 sotalol started Aug 2022 more VT and mexiletine resumed 01/18/21, VT ablation 02/09/21, device interrogation done for MRI noted VT treated with ATP and mexiletine ordered for increase to 300mg  BID >>>> THIS DID NOT GET TRANSLATED ON DISCHARGE AND remained at 165ms   Mr. Ahonen has had a long year with recurrent VT and medical issues. He was Hospitalized March, June, July, and Aug 2022 with VT, AAD added and escalated as above.  He was admitted 02/11/21 with L great toe cellulitis and underwent amputation.   Readmitted 02/14/21 with CP, NSTEMI, underwent cath and PCI  (Difficult for successful PTCA/PCI using 3.5 mm ScoreFlex balloon multiple inflations with post dilation of the previously stented) overlapping  Onyx Frontier DES 2.75 mm x 22 mm placed overlapping distally); prox RCA ~50%, prox-distal RCA 35%.  Unfortunately suffered a stroke and also found with a lung nodule.   Finally discharged to rehab 02/18/21     On 02/24/21 in CIR, he woke feeling great, though noted some light palpitations.  No other cardiac awareness.  No CP.  He felt unusually weak with rehab, and staff found his HR elevated. EKG was done noting [facility] and cardiology called to see.   He was in bed, feeling well.  No cardiac awareness, denies any kind of CP, and has not had any CP.  No near syncope or syncope. BP was 101/66, HR 130's   The patient was seen with Dr. Sallyanne Kuster, and via programmer confirmed to be in VT.  His detection rate at 140, noted he had 2 ATPs that were successful overnight last night.   Detection rate was reduced to allow device to run through the ATP cycles, though did not work, given asymptomatic and hemodynamically stable,  did not allow HV therapies.   Dr. Sallyanne Kuster was able to break with manual ATP's the last that worked was 353ms.   Note that he was only getting 150mg  BID   He feels well, says his stroke symptoms are nearly resolved, vision is almost back to normal and reports that he was going to be released to "room privileges" today, meaning he could get OOB on his own apparently.   Hospital Course     Consultants: EP   VT Essentially asymptomatic. VT was terminated by manual ATPs by Dr. Sallyanne Kuster. [name] was increase back to 300 mg BID as was initially planned. Apparently, [name] was not restarted at BID dosing after toe amputation surgery, but was discharged to CIR with BID dosing at 300 mg No changes made to his therapy programming but monitor zones was reduced to 120 bpm. Sotalol was continued.    Device Interrogation: Device was interrogated and reprogrammed to incorporate a VT zone down to 120 bpm to allow for ATP for his slow VT.  With his desire that he not shocked for slow VT; hence, no  shocks are in the VT zone     Atrial flutter Chronic anticoagulation - s/p flutter ablation - continue sotalol, mexiletine, and eliquis     CAD - recent PCI treated with difficult but successful DES x 1 to 100% occlusion of RPA V/PL system on 02/15/21 - continue 30 days of triple therapy with ASA, plavix, and eliquis, then stop ASA - complicated by periprocedural CVA --> discharged to CIR     Ischemic cardiomyopathy ICD in place - continue GDMT with BB, entrestor, and jardiance     Lung nodule Plan for biopsy after ASA has been stopped, in 4-6 weeks. This will require readmission for heparin drip as a bridge, given recent stent. This will need to be coordinated with cardiology.  With enlarging nodule - will likely need biopsy for definitive Dx - unfortunately, he has just suffered 2 major events (MI - physiologically a STEMI although technically only NSTEMI by EKG criteria & Per-Procedural CVA with impressive improvement).  He is currently on Triple AC/Antiplatelet Rx with plans to continue at least 1 month -- then plan to d/c ASA & continue clopidogrel & apixiban. For Bx, would need to hold both agents  -  Apixiban x 48 hr & clopidogrel x 5 days (minimum) -- given recent MI & DES PCI - would be best if we could bridge with GP 2b3a Inhibitor while off of Clopidogrel & IV Heparin vs. Enoxaparin while of of apixiban. -- will discuss options with [facility] team to determine best COA- but would prefer to wait at least 1 month (esp due to CVA     Pt was seen and examined by Dr Caryl Comes and deemed stable for discharge.     03/03/21 CT of lung showed: IMPRESSION: 1. Along a cluster of tree-in-bud nodularity in the left upper lobe, a previous 60 cubic mm nodule currently measures 4200 cubic mm, averaging 20 mm in diameter. Although proximity to the tree-in-bud nodularity raises the possibility that this represents atypical infection such as MAI, the nodule growth is clearly different  from the other nodules and raises the possibility of malignancy. Tissue diagnosis is recommended when feasible.   He is here today for follow-up.  He has an appointment to see his pulmonologist in 1 month.  He follows up regularly with Dr. Valeta Harms.  Therefore he would like to discuss the recommended biopsy at that appointment.  I  believe this is appropriate because cardiology requested that he continue his anticoagulation for at least 1 month due to his recent CVA.  He has a very complicated medical history.  He has been admitted to the hospital several times over the last 6 months.  Recently underwent stenting due to a non-ST elevation myocardial infarction.  Subsequently was admitted to the hospital with ventricular tachycardia and his mexiletine was increased to 300 mg p.o. twice daily in an effort to try to prevent recurrent ventricular tachycardia.  He also recently suffered an amputation of his second right toe performed by podiatry while he was in the hospital.  He denies any further palpitations since discharge from the hospital.  He denies any syncope.  He denies any lightheadedness.  He is wearing an eye patch over his left eye due to diplopia stemming from his stroke as his extraocular movements are now no longer coordinated.  Overall however his attitude is extremely positive.  He seems to be dealing with everything quite well.  His recent A1c was elevated.  His A1c has been ranging between 7 and 8 over the last several months.  He is currently on 45 units of Levemir once daily and then occasional short acting insulin.  He states that his fasting blood sugars are typically between 100-111 in the morning after he takes the Levemir at night.  However he states that his 2-hour postprandial sugars are often 180 or higher in the evenings.  Therefore it seems like he would benefit from mealtime insulin in an effort to try to better manage his diabetes. Past Medical History:  Diagnosis Date   Atrial  flutter ([place])    s/p cardioversion   Coronary artery disease    Diabetes mellitus    GERD (gastroesophageal reflux disease)    History of nuclear stress test 04/04/2011   lexiscan; mod-large in size fixed inferolateral defect (scar); non-diagnostic for ischemia; low risk scan    Hyperlipidemia    Hypertension    Left foot drop    r/t past disk srugery - uses Kevlar brace   Myocardial infarction (HCC)    posterior MI   Shortness of breath    Sleep apnea    on CPAP; 04/28/2007 split-night - AHI during total sleep 44.43/hr and REM 72.56/hr   Past Surgical History:  Procedure Laterality Date   AMPUTATION TOE Left 02/10/2021   Procedure: AMPUTATION  LEFT SECOND TOE;  Surgeon: Edrick Kins, DPM;  Location: [place];  Service: Podiatry;  Laterality: Left;   [place]  2010   6 stents total   CARDIAC CATHETERIZATION  01/2000   percutaneous transluminal coronary balloon angioplasty of mid RCA stenotic lesion   CARDIAC CATHETERIZATION  06/2006   no stenting; ischemic cardiomyopathy, EF 40-45%   CARDIOVERSION N/A 07/28/2016   Procedure: CARDIOVERSION;  Surgeon: Troy Sine, MD;  Location: [place];  Service: Cardiovascular;  Laterality: N/A;   CORONARY ANGIOPLASTY  09/1998   mid-distal RCA balloon dilatation, 4.5 & 5.0 stents    CORONARY ANGIOPLASTY WITH STENT PLACEMENT  03/1994   angioplasty & stenting (non-DES) of circumflex/prox ramus intermedius   CORONARY ANGIOPLASTY WITH STENT PLACEMENT  10/1994   large iliac [id number] stent to RCA   [place]  12/2002   4.65mm stents x2 of RCA   CORONARY ANGIOPLASTY WITH STENT PLACEMENT  01/2005   cutting balloon arthrectomy of distal RCA & Cypher DES 3.5x13; cutting balloon arthrectomy of mid  RCA with Cypher DES 3.5x18   CORONARY ANGIOPLASTY WITH STENT PLACEMENT  11/2008   stenting of mid RCA with 4.0x58mm driver, non-DES   CORONARY BALLOON ANGIOPLASTY N/A 02/15/2021   Procedure:  CORONARY BALLOON ANGIOPLASTY;  Surgeon: Troy Sine, MD;  Location: [place] [facility];  Service: Cardiovascular;  Laterality: N/A;   ICD IMPLANT N/A 08/20/2020   Procedure: ICD IMPLANT;  Surgeon: Constance Haw, MD;  Location: [place] [facility];  Service: Cardiovascular;  Laterality: N/A;   INTRAVASCULAR PRESSURE WIRE/FFR STUDY N/A 03/02/2020   Procedure: INTRAVASCULAR PRESSURE WIRE/FFR STUDY;  Surgeon: Leonie Man, MD;  Location: [place] [facility];  Service: Cardiovascular;  Laterality: N/A;   LEFT HEART CATH AND CORONARY ANGIOGRAPHY N/A 03/02/2020   Procedure: LEFT HEART CATH AND CORONARY ANGIOGRAPHY;  Surgeon: Leonie Man, MD;  Location: [place] [facility];  Service: Cardiovascular;  Laterality: N/A;   LEFT HEART CATH AND CORONARY ANGIOGRAPHY N/A 08/19/2020   Procedure: LEFT HEART CATH AND CORONARY ANGIOGRAPHY;  Surgeon: Lorretta Harp, MD;  Location: [place] [facility];  Service: Cardiovascular;  Laterality: N/A;   LEFT HEART CATH AND CORONARY ANGIOGRAPHY N/A 02/15/2021   Procedure: LEFT HEART CATH AND CORONARY ANGIOGRAPHY;  Surgeon: Troy Sine, MD;  Location: [place] [facility];  Service: Cardiovascular;  Laterality: N/A;   LEFT HEART CATHETERIZATION WITH CORONARY ANGIOGRAM N/A 02/27/2012   Procedure: LEFT HEART CATHETERIZATION WITH CORONARY ANGIOGRAM;  Surgeon: Lorretta Harp, MD;  Location: [facility] C[facility] LAB;  Service: Cardiovascular;  Laterality: N/A;   TRANSTHORACIC ECHOCARDIOGRAM  07/29/2010   EF 50=55%, mod inf wall hypokinesis & mild post wall hypokinesis; LA mild-mod dilated; mild mitral annular calcif & mild MR; mild TR & elevated RV systolic pressure; AV mildly sclerotic; mild aortic root dilatation    V TACH ABLATION N/A 01/20/2021   Procedure: V TACH ABLATION;  Surgeon: Vickie Epley, MD;  Location: [place] [facility];  Service: Cardiovascular;  Laterality: N/A;   Current Outpatient Medications on File Prior to Visit  Medication Sig Dispense Refill    acetaminophen (TYLENOL) 500 MG tablet Take 500 mg by mouth every 6 (six) hours as needed for moderate pain.     albuterol (VENTOLIN HFA) 108 (90 Base) MCG/ACT inhaler Inhale 2 puffs into the lungs every 6 (six) hours as needed for wheezing or shortness of breath. 1 each 6   aspirin EC 81 MG EC tablet Take 1 tablet (81 mg total) by mouth daily. Swallow whole. 30 tablet 11   clopidogrel (PLAVIX) 75 MG tablet Take 1 tablet (75 mg total) by mouth daily with breakfast.     clopidogrel (PLAVIX) 75 MG tablet Take 1 tablet (75 mg total) by mouth daily. 90 tablet 3   ELIQUIS 5 MG TABS tablet TAKE 1 TABLET BY MOUTH TWICE A DAY (Patient taking differently: Take 5 mg by mouth 2 (two) times daily.) 180 tablet 1   ENTRESTO 24-26 MG TAKE 1 TABLET BY MOUTH TWICE A DAY (Patient taking differently: Take 1 tablet by mouth 2 (two) times daily.) 60 tablet 6   Fluticasone Furoate (ARNUITY ELLIPTA) 200 MCG/ACT AEPB Inhale 1 puff into the lungs daily. 30 each 6   glucose blood (ONETOUCH ULTRA) test strip USE TO CHECK BLOOD SUGAR 3 TIMES A DAY (E11.9) 100 strip 2   HYDROcodone-acetaminophen (NORCO/VICODIN) 5-325 MG tablet Take 1 tablet by mouth every 6 (six) hours as needed for severe pain. 10 tablet 0   insulin aspart (NOVOLOG FLEXPEN) 100  UNIT/ML FlexPen INJECT 5-20 UNITS SUBCUTANEOUSLY WITH LUNCH AND DINNER (Patient taking differently: Inject 6-8 Units into the skin daily as needed for high blood sugar.) 15 mL 1   Insulin Pen Needle (NOVOFINE) 32G X 6 MM MISC 1 each by Other route daily. 100 each 3   JARDIANCE 25 MG TABS tablet TAKE 1 TABLET BY MOUTH EVERY DAY (Patient taking differently: Take 25 mg by mouth daily.) 30 tablet 5   ketoconazole (NIZORAL) 2 % shampoo Apply 1 application topically 2 (two) times a week.     LEVEMIR FLEXTOUCH 100 UNIT/ML FlexPen INJECT 46 UNITS INTO THE SKIN DAILY. DX:E11.9 (Patient taking differently: Inject 46 Units into the skin daily.) 15 mL 3   metoprolol succinate (TOPROL-XL) 100 MG 24 hr  tablet Take 1 tablet (100 mg total) by mouth daily. Take with or immediately following a meal. (Patient taking differently: Take 100 mg by mouth daily.) 90 tablet 1   mexiletine (MEXITIL) 150 MG capsule Take 2 capsules (300 mg total) by mouth every 12 (twelve) hours. 120 capsule 5   montelukast (SINGULAIR) 10 MG tablet TAKE 1 TABLET BY MOUTH EVERY DAY 90 tablet 3   nitroGLYCERIN (NITROSTAT) 0.4 MG SL tablet PLACE 1 TABLET (0.4 MG TOTAL) UNDER THE TONGUE EVERY 5 (FIVE) MINUTES AS NEEDED FOR CHEST PAIN. 25 tablet 1   rosuvastatin (CRESTOR) 40 MG tablet Take 1 tablet (40 mg total) by mouth daily.     sotalol (BETAPACE) 160 MG tablet Take 1 tablet (160 mg total) by mouth 2 (two) times daily. 60 tablet 6   No current facility-administered medications on file prior to visit.   Allergies  Allergen Reactions   Omega-3 Fatty Acids Hives and Itching   Benazepril Other (See Comments)    hyperkalemia   Fish Allergy Itching   Social History   Socioeconomic History   Marital status: Married    Spouse name: Not on file   Number of children: 3   Years of education: Not on file   Highest education level: Not on file  Occupational History   Occupation: Best boy: [employer]    Comment: [place], [place]. [place]  Tobacco Use   Smoking status: Former    Packs/day: 1.00    Years: 50.00    Pack years: 50.00    Types: Cigarettes    Quit date: 07/20/2016    Years since quitting: 4.6   Smokeless tobacco: Never  Vaping Use   Vaping Use: Never used  Substance and Sexual Activity   Alcohol use: Not Currently    Alcohol/week: 0.0 standard drinks   Drug use: No   Sexual activity: Yes  Other Topics Concern   Not on file  Social History Narrative   Not on file   Social Determinants of Health   Financial Resource Strain: Not on file  Food Insecurity: Not on file  Transportation Needs: Not on file  Physical Activity: Not on file  Stress: Not on file  Social Connections: Not on file   Intimate Partner Violence: Not on file      Review of Systems  All other systems reviewed and are negative.     Objective:   Physical Exam Vitals reviewed.  Constitutional:      General: He is not in acute distress.    Appearance: Normal appearance. He is well-developed and normal weight. He is not ill-appearing or diaphoretic.  HENT:     Nose: Nose normal.     Mouth/Throat:  Pharynx: No oropharyngeal exudate.  Eyes:     Extraocular Movements:     Left eye: Abnormal extraocular motion present.     Conjunctiva/sclera:     Right eye: Right conjunctiva is not injected. No chemosis or exudate. Neck:     Thyroid: No thyromegaly.  Cardiovascular:     Rate and Rhythm: Normal rate and regular rhythm.     Heart sounds: Normal heart sounds. No murmur heard.   No friction rub. No gallop.  Pulmonary:     Effort: Pulmonary effort is normal. No respiratory distress.     Breath sounds: Normal breath sounds. No wheezing or rales.  Abdominal:     General: Bowel sounds are normal. There is no distension.     Palpations: Abdomen is soft. There is no mass.     Tenderness: There is no abdominal tenderness. There is no guarding or rebound.  Musculoskeletal:     Cervical back: Neck supple.     Right lower leg: No edema.     Left lower leg: No edema.  Lymphadenopathy:     Cervical: No cervical adenopathy.  Neurological:     Mental Status: He is alert.     Gait: Gait normal.  Psychiatric:        Mood and Affect: Mood normal.        Behavior: Behavior normal.        Thought Content: Thought content normal.        Judgment: Judgment normal.          Assessment & Plan:  ASCVD (arteriosclerotic cardiovascular disease)  Uncontrolled type 2 diabetes mellitus with hyperglycemia (HCC)  Non-ST elevation (NSTEMI) myocardial infarction ([place])  VT (ventricular tachycardia) (HCC)  History of CVA (cerebrovascular accident) I will defer to cardiology regarding the management of his  ventricular tachycardia.  Patient has a defibrillator that is set to perform defibrillation at heart rate greater than 170.  He has not felt any shocks and he denies feeling any palpitations or irregular heartbeat since his mexiletine was increased.  He denies any syncope.  He denies any residual chest pain or shortness of breath.  I feel that I can fit in his management care by trying to better control his diabetes.  I would like to see his A1c less than 7.  His fasting blood sugar seem to be well controlled on the current dose of Levemir.  I would not increase this further.  However I encouraged the patient to add 5 units of NovoLog with meals in an effort to try to keep his 2-hour postprandial sugars below 160 if possible.  Patient will gradually uptitrate his mealtime insulin based on the response to this.  I would like him to call me with the sugar update in 2 weeks.  The patient also has a lesion that has grown from roughly 6 mm to 20 mm in his right lung.  He has an appointment to see his pulmonologist in 1 month.  Neurology and cardiology recommended continuing triple therapy anticoagulation for at least 1 month with aspirin, Plavix, and Eliquis.  After 1 month they have recommended discontinuation of aspirin and continuing Plavix and Eliquis indefinitely.  Therefore, I will defer until his appointment with his pulmonologist any further work-up.  At that time they have recommended hospitalization with IV heparin and IV GP2b3a inhibitor bridge while holding plavix and eliquis to perform biopsy.

## 2021-03-07 ENCOUNTER — Ambulatory Visit: Payer: Medicare Other | Admitting: Physician Assistant

## 2021-03-07 NOTE — Progress Notes (Signed)
Cardiology Office Note:    Date:  03/08/2021   ID:  Shann, Merrick 1951-04-29, MRN [id number]  PCP:  [name], MD  Cardiologist:  Shelva Majestic, MD   Referring MD: [name], MD   Chief Complaint  Patient presents with   Hospitalization Follow-up    Stroke, PCI, VT    History of Present Illness:    John [name] is a 70 y.o. male with a hx of CAD with multiple prior PCI's reported in the RCA and LCx, last stenting was 02/05/2021, ICM, hypertension, hyperlipidemia, DM, OSA on CPAP, TAA, ILD, former smoker quit in 2019, atrial flutter, back surgery resulting in left foot drop, chronic back pain, and VT status post MDT dual-chamber ICD implanted 08/20/2020 for secondary prevention with history of hemodynamically unstable MMVT.  Due to ILD, amiodarone has been discontinued in 2019.  He follows with pulmonology.  AAD therapy includes mexiletine and sotalol.  He underwent VT ablation on 01/18/2021.  Device interrogation on 02/09/2021 done for MRI noted VT treated with ATP and mexiletine was increased to 300 mg twice daily.  Apparently this was not translated at discharge and he remained at 150 mg twice daily.  He was admitted 02/11/2021 with left great toe cellulitis and underwent amputation.  He was readmitted on 02/14/2021 with chest pain found to have an NSTEMI treated with difficult but successful PTCA/PCI and DES to distal RCA.  Procedure was complicated by periprocedural CVA prompting a code stroke.  He was ultimately discharged to CIR.  On 02/24/2021, PT noted elevated heart rate and wide-complex tachycardia on EKG.  Cardiology was consulted.  Through his device he was confirmed to be in VT.  Dr. Sallyanne Kuster was able to break with manual ATPs, the last one that worked was 300 ms.  It was also noted that he was only getting 150 mg of mexiletine twice daily and CR.  [name] was increased back to 300 mg twice daily, sotalol continued, metoprolol continued.  Device Interrogation:  Device was interrogated and reprogrammed to incorporate a VT zone down to 120 bpm to allow for ATP for his slow VT.  With his desire that he not shocked for slow VT; hence, no shocks are in the VT zone   He was discharged home on 02/26/2021.  He returns today for routine follow-up.  He is here alone with cane and eye patch. He is sleeping a lot lately. His main complaint is dyspnea on exertion that was present prior to his last 2 hospitalizations and prior to PCI. Suspect this is related to the lung nodule. He is doing well from a cardiac standpoint.    Past Medical History:  Diagnosis Date   Atrial flutter ([place])    s/p cardioversion   Coronary artery disease    Diabetes mellitus    GERD (gastroesophageal reflux disease)    History of nuclear stress test 04/04/2011   lexiscan; mod-large in size fixed inferolateral defect (scar); non-diagnostic for ischemia; low risk scan    Hyperlipidemia    Hypertension    Left foot drop    r/t past disk srugery - uses Kevlar brace   Myocardial infarction (HCC)    posterior MI   Shortness of breath    Sleep apnea    on CPAP; 04/28/2007 split-night - AHI during total sleep 44.43/hr and REM 72.56/hr    Past Surgical History:  Procedure Laterality Date   AMPUTATION TOE Left 02/10/2021   Procedure: AMPUTATION  LEFT SECOND TOE;  Surgeon: Edrick Kins, DPM;  Location: [place];  Service: Podiatry;  Laterality: Left;   [place]  2010   6 stents total   CARDIAC CATHETERIZATION  01/2000   percutaneous transluminal coronary balloon angioplasty of mid RCA stenotic lesion   CARDIAC CATHETERIZATION  06/2006   no stenting; ischemic cardiomyopathy, EF 40-45%   CARDIOVERSION N/A 07/28/2016   Procedure: CARDIOVERSION;  Surgeon: Troy Sine, MD;  Location: [facility] [facility];  Service: Cardiovascular;  Laterality: N/A;   CORONARY ANGIOPLASTY  09/1998   mid-distal RCA balloon dilatation, 4.5 & 5.0 stents    CORONARY ANGIOPLASTY WITH  STENT PLACEMENT  03/1994   angioplasty & stenting (non-DES) of circumflex/prox ramus intermedius   CORONARY ANGIOPLASTY WITH STENT PLACEMENT  10/1994   large iliac [id number] stent to RCA   [place]  12/2002   4.68mm stents x2 of RCA   CORONARY ANGIOPLASTY WITH STENT PLACEMENT  01/2005   cutting balloon arthrectomy of distal RCA & Cypher DES 3.5x13; cutting balloon arthrectomy of mid RCA with Cypher DES 3.5x18   CORONARY ANGIOPLASTY WITH STENT PLACEMENT  11/2008   stenting of mid RCA with 4.0x51mm driver, non-DES   CORONARY BALLOON ANGIOPLASTY N/A 02/15/2021   Procedure: CORONARY BALLOON ANGIOPLASTY;  Surgeon: Troy Sine, MD;  Location: [place] [facility];  Service: Cardiovascular;  Laterality: N/A;   ICD IMPLANT N/A 08/20/2020   Procedure: ICD IMPLANT;  Surgeon: Constance Haw, MD;  Location: [place] [facility];  Service: Cardiovascular;  Laterality: N/A;   INTRAVASCULAR PRESSURE WIRE/FFR STUDY N/A 03/02/2020   Procedure: INTRAVASCULAR PRESSURE WIRE/FFR STUDY;  Surgeon: Leonie Man, MD;  Location: [place] [facility];  Service: Cardiovascular;  Laterality: N/A;   LEFT HEART CATH AND CORONARY ANGIOGRAPHY N/A 03/02/2020   Procedure: LEFT HEART CATH AND CORONARY ANGIOGRAPHY;  Surgeon: Leonie Man, MD;  Location: [place] [facility];  Service: Cardiovascular;  Laterality: N/A;   LEFT HEART CATH AND CORONARY ANGIOGRAPHY N/A 08/19/2020   Procedure: LEFT HEART CATH AND CORONARY ANGIOGRAPHY;  Surgeon: Lorretta Harp, MD;  Location: [place] [facility];  Service: Cardiovascular;  Laterality: N/A;   LEFT HEART CATH AND CORONARY ANGIOGRAPHY N/A 02/15/2021   Procedure: LEFT HEART CATH AND CORONARY ANGIOGRAPHY;  Surgeon: Troy Sine, MD;  Location: [place] [facility];  Service: Cardiovascular;  Laterality: N/A;   LEFT HEART CATHETERIZATION WITH CORONARY ANGIOGRAM N/A 02/27/2012   Procedure: LEFT HEART CATHETERIZATION WITH CORONARY ANGIOGRAM;  Surgeon: Lorretta Harp, MD;  Location: [facility];  Service: Cardiovascular;  Laterality: N/A;   TRANSTHORACIC ECHOCARDIOGRAM  07/29/2010   EF 50=55%, mod inf wall hypokinesis & mild post wall hypokinesis; LA mild-mod dilated; mild mitral annular calcif & mild MR; mild TR & elevated RV systolic pressure; AV mildly sclerotic; mild aortic root dilatation    V TACH ABLATION N/A 01/20/2021   Procedure: V TACH ABLATION;  Surgeon: Vickie Epley, MD;  Location: [place] [facility];  Service: Cardiovascular;  Laterality: N/A;    Current Medications: Current Meds  Medication Sig   acetaminophen (TYLENOL) 500 MG tablet Take 500 mg by mouth every 6 (six) hours as needed for moderate pain.   albuterol (VENTOLIN HFA) 108 (90 Base) MCG/ACT inhaler Inhale 2 puffs into the lungs every 6 (six) hours as needed for wheezing or shortness of breath.   aspirin EC 81 MG EC tablet Take 1 tablet (81 mg total) by mouth  daily. Swallow whole.   clopidogrel (PLAVIX) 75 MG tablet Take 1 tablet (75 mg total) by mouth daily with breakfast.   ELIQUIS 5 MG TABS tablet TAKE 1 TABLET BY MOUTH TWICE A DAY (Patient taking differently: Take 5 mg by mouth 2 (two) times daily.)   ENTRESTO 24-26 MG TAKE 1 TABLET BY MOUTH TWICE A DAY (Patient taking differently: Take 1 tablet by mouth 2 (two) times daily.)   Fluticasone Furoate (ARNUITY ELLIPTA) 200 MCG/ACT AEPB Inhale 1 puff into the lungs daily.   glucose blood (ONETOUCH ULTRA) test strip USE TO CHECK BLOOD SUGAR 3 TIMES A DAY (E11.9)   HYDROcodone-acetaminophen (NORCO/VICODIN) 5-325 MG tablet Take 1 tablet by mouth every 6 (six) hours as needed for severe pain.   insulin aspart (NOVOLOG FLEXPEN) 100 UNIT/ML FlexPen INJECT 5-20 UNITS SUBCUTANEOUSLY WITH LUNCH AND DINNER (Patient taking differently: Inject 6-8 Units into the skin daily as needed for high blood sugar.)   Insulin Pen Needle (NOVOFINE) 32G X 6 MM MISC 1 each by Other route daily.   JARDIANCE 25 MG TABS tablet TAKE 1 TABLET BY MOUTH EVERY  DAY (Patient taking differently: Take 25 mg by mouth daily.)   ketoconazole (NIZORAL) 2 % shampoo Apply 1 application topically 2 (two) times a week.   LEVEMIR FLEXTOUCH 100 UNIT/ML FlexPen INJECT 46 UNITS INTO THE SKIN DAILY. DX:E11.9 (Patient taking differently: Inject 46 Units into the skin daily.)   metoprolol succinate (TOPROL-XL) 100 MG 24 hr tablet Take 1 tablet (100 mg total) by mouth daily. Take with or immediately following a meal. (Patient taking differently: Take 100 mg by mouth daily.)   mexiletine (MEXITIL) 150 MG capsule Take 2 capsules (300 mg total) by mouth every 12 (twelve) hours.   montelukast (SINGULAIR) 10 MG tablet TAKE 1 TABLET BY MOUTH EVERY DAY   nitroGLYCERIN (NITROSTAT) 0.4 MG SL tablet PLACE 1 TABLET (0.4 MG TOTAL) UNDER THE TONGUE EVERY 5 (FIVE) MINUTES AS NEEDED FOR CHEST PAIN.   rosuvastatin (CRESTOR) 40 MG tablet Take 1 tablet (40 mg total) by mouth daily.   sotalol (BETAPACE) 160 MG tablet Take 1 tablet (160 mg total) by mouth 2 (two) times daily.     Allergies:   Omega-3 fatty acids, Benazepril, and Fish allergy   Social History   Socioeconomic History   Marital status: Married    Spouse name: Not on file   Number of children: 3   Years of education: Not on file   Highest education level: Not on file  Occupational History   Occupation: Best boy: [employer]    Comment: [place], [place]. [place]  Tobacco Use   Smoking status: Former    Packs/day: 1.00    Years: 50.00    Pack years: 50.00    Types: Cigarettes    Quit date: 07/20/2016    Years since quitting: 4.6   Smokeless tobacco: Never  Vaping Use   Vaping Use: Never used  Substance and Sexual Activity   Alcohol use: Not Currently    Alcohol/week: 0.0 standard drinks   Drug use: No   Sexual activity: Yes  Other Topics Concern   Not on file  Social History Narrative   Not on file   Social Determinants of Health   Financial Resource Strain: Not on file  Food Insecurity: Not on  file  Transportation Needs: Not on file  Physical Activity: Not on file  Stress: Not on file  Social Connections: Not on file  Family History: The patient's family history includes Heart attack in his father.  ROS:   Please see the history of present illness.     All other systems reviewed and are negative.  EKGs/Labs/Other Studies Reviewed:    The following studies were reviewed today:  02/15/21: LHC/PCI   1st Mrg lesion is 55% stenosed.   Prox RCA lesion is 50% stenosed.   Prox RCA to Dist RCA lesion is 35% stenosed.   RPAV-1 lesion is 100% stenosed.   RPAV-2 lesion is 80% stenosed.   A stent was successfully placed.   Post intervention, there is a 0% residual stenosis.   Post intervention, there is a 0% residual stenosis.   Acute coronary syndrome secondary to total occlusion of a large distal right coronary artery at a site of prior distal stenting.   The LAD has mild irregularity without significant disease.  The large ramus intermediate vessel has previously noted 55% very proximal stenosis not significantly changed.  The AV groove circumflex is a small vessel.   The RCA is a very large vessel with diffuse stents proximally to very distally.  There is proximal 50% stenosis prior to the proximal to mid stent.  There is diffuse 35% narrowing within the long proximal stent.  The distal stent is totally occluded just after PDA vessel.   Difficult but successful PCI requiring PTCA, a 3.5 x 15 mm Scorflex multiple dilatations throughout the previously placed tandem 3.5 mm  distal stents with ultimate noncompliant balloon dilatation to approximately 3.6 mm and insertion of a 2.75 x 22 mm Onyx Frontier new stent distal to the distal stent placed in overlap 75 to 80% stenosis beyond the stented segment, postdilated with a 3 oh balloon with residual narrowing of 0%.   LVEDP 18 mmHg   RECOMMENDATION: Initial triple drug therapy with plan discontinuance of aspirin and continuation  of Plavix/for minimum of 6 to 12 months.  Near the completion of the procedure, the patient appeared slightly less responsive.  Blood sugar was obtained which was 63.  He was given one half amp of D50.  After the procedure, a subtle left facial droop developed and there appeared to be  left arm weakness..  A code stroke was activated and the patient was seen immediately by neurology and sent for head CT for further evaluation.     02/15/21: TTE IMPRESSIONS   1. Left ventricular ejection fraction, by estimation, is 40 to 45%. The  left ventricle has mildly decreased function. The left ventricle  demonstrates regional wall motion abnormalities (see scoring  diagram/findings for description). The left ventricular   internal cavity size was moderately dilated. Left ventricular diastolic  parameters are consistent with Grade I diastolic dysfunction (impaired  relaxation).   2. Right ventricular systolic function is normal. The right ventricular  size is normal. There is normal pulmonary artery systolic pressure.   3. Left atrial size was mildly dilated.   4. The mitral valve is normal in structure. Trivial mitral valve  regurgitation. No evidence of mitral stenosis.   5. The aortic valve is normal in structure. Aortic valve regurgitation is  not visualized.   6. Aortic dilatation noted. There is borderline dilatation of the aortic  root, measuring 38 mm.   Comparison(s): Prior images reviewed side by side. The left ventricular  function is unchanged. The left ventricular wall motion abnormalities are  worse, although the general distribution is similar. There seems to be  more some worsening of contractility  in the  more distal portion of the inferior and inferoseptal walls, with  sparing of the true apex and without meaningful change in overall LVEF.    EKG:  EKG is  ordered today.  The ekg ordered today demonstrates difficult baseline, regular rhythm, HR 83, stable from prior  Recent  Labs: 01/17/2021: B Natriuretic Peptide 169.7 01/18/2021: TSH 0.404 02/11/2021: Magnesium 2.2 02/19/2021: ALT 17 02/24/2021: Hemoglobin 16.2; Platelets 262 02/25/2021: BUN 18; Creatinine, Ser 1.05; Potassium 3.6; Sodium 141  Recent Lipid Panel    Component Value Date/Time   CHOL 101 02/15/2021 0342   CHOL 115 01/24/2019 0930   TRIG 80 02/15/2021 0342   HDL 34 (L) 02/15/2021 0342   HDL 33 (L) 01/24/2019 0930   CHOLHDL 3.0 02/15/2021 0342   VLDL 16 02/15/2021 0342   LDLCALC 51 02/15/2021 0342   LDLCALC 50 09/15/2019 1538    Physical Exam:    VS:  BP 116/82   Pulse 83   Ht 6' (1.829 m)   Wt 211 lb (95.7 kg)   SpO2 96%   BMI 28.62 kg/m     Wt Readings from Last 3 Encounters:  03/08/21 211 lb (95.7 kg)  03/03/21 210 lb (95.3 kg)  02/26/21 210 lb 8 oz (95.5 kg)     GEN: Well nourished, well developed in no acute distress, eye patch on left eye, with walking stick HEENT: Normal NECK: No JVD; No carotid bruits LYMPHATICS: No lymphadenopathy CARDIAC: RRR, no murmurs, rubs, gallops RESPIRATORY:  Clear to auscultation without rales, wheezing or rhonchi  ABDOMEN: Soft, non-tender, non-distended MUSCULOSKELETAL:  No edema; No deformity  SKIN: Warm and dry NEUROLOGIC:  Alert and oriented x 3 PSYCHIATRIC:  Normal affect   ASSESSMENT:    1. VT (ventricular tachycardia) (HCC)   2. Coronary artery disease involving native coronary artery of native heart without angina pectoris   3. Ischemic cardiomyopathy   4. Atrial flutter, unspecified type ([place])   5. ICD (implantable cardioverter-defibrillator) in place   6. Chronic systolic congestive heart failure ([place])   7. Cerebrovascular accident (CVA) due to embolism of carotid artery, unspecified blood vessel laterality ([place])   8. Anticoagulation adequate   9. Hyperlipidemia LDL goal <70   10. Left-sided neglect   11. Lung nodule    PLAN:    In order of problems listed above:  VT - Continue 300 mg twice daily mexiletine, sotalol,  metoprolol - dyspnea on exertion, but no palpitations - fatigue, may be partly due to increased dose of mexiletine   Atrial flutter Chronic anticoagulation - He is status post flutter ablation - Continue antiarrhythmics as above - Continue Eliquis - no bleeding problems   CAD - recent PCI treated with difficult but successful DES x 1 to 100% occlusion of RPA V/PL system on 02/15/21 - continue 30 days of triple therapy with ASA, plavix, and eliquis, then stop ASA - complicated by periprocedural CVA --> discharged to CIR - discharged home following VT hospitalization - no chest pain - doing well on triple therapy  - last dose of ASA will be 03/18/21 - he is doing well walking in the yard every day    Ischemic cardiomyopathy ICD in place - continue GDMT with BB, entrestor, and jardiance - appears euvolemic in exam    CVA - is not getting PT at home - I have placed a face-to-face and will reach out to SW    Lung nodule Plan for biopsy after ASA has been stopped, in 4-6 weeks. This will require  readmission for heparin drip as a bridge, given recent stent. This will need to be coordinated with cardiology.  With enlarging nodule - will likely need biopsy for definitive Dx - unfortunately, he has just suffered 2 major events (MI - physiologically a STEMI although technically only NSTEMI by EKG criteria & Per-Procedural CVA with impressive improvement).  He is currently on Triple AC/Antiplatelet Rx with plans to continue at least 1 month -- then plan to d/c ASA & continue clopidogrel & apixiban. For Bx, would need to hold both agents  -  Apixiban x 48 hr & clopidogrel x 5 days (minimum) -- given recent MI & DES PCI - would be best if we could bridge with GP 2b3a Inhibitor while off of Clopidogrel & IV Heparin vs. Enoxaparin while of of apixiban. -- will discuss options with [facility] team to determine best COA- but would prefer to wait at least 1 month (esp due to CVA    He is  concerned about risk of adverse event going into lung nodule biopsy. We discussed that he is in a better place now that we have intervened on his coronary stenosis, but not without risk. He understands and wishes to proceed.    Follow up in 2 months with Dr. Claiborne Billings.     Medication Adjustments/Labs and Tests Ordered: Current medicines are reviewed at length with the patient today.  Concerns regarding medicines are outlined above.  Orders Placed This Encounter  Procedures   Home Health   Face-to-face encounter (required for Medicare/Medicaid patients)   EKG 12-Lead    No orders of the defined types were placed in this encounter.   Signed, Ledora Bottcher, [place]  03/08/2021 12:37 PM    [facility]

## 2021-03-08 ENCOUNTER — Other Ambulatory Visit: Payer: Self-pay

## 2021-03-08 ENCOUNTER — Ambulatory Visit (INDEPENDENT_AMBULATORY_CARE_PROVIDER_SITE_OTHER): Payer: Medicare Other | Admitting: Physician Assistant

## 2021-03-08 ENCOUNTER — Ambulatory Visit: Payer: Medicare Other | Admitting: Cardiology

## 2021-03-08 ENCOUNTER — Encounter: Payer: Self-pay | Admitting: Physician Assistant

## 2021-03-08 ENCOUNTER — Telehealth: Payer: Self-pay | Admitting: Licensed Clinical Social Worker

## 2021-03-08 VITALS — BP 116/82 | HR 83 | Ht 72.0 in | Wt 211.0 lb

## 2021-03-08 DIAGNOSIS — Z7901 Long term (current) use of anticoagulants: Secondary | ICD-10-CM | POA: Diagnosis not present

## 2021-03-08 DIAGNOSIS — Z95810 Presence of automatic (implantable) cardiac defibrillator: Secondary | ICD-10-CM

## 2021-03-08 DIAGNOSIS — I472 Ventricular tachycardia, unspecified: Secondary | ICD-10-CM

## 2021-03-08 DIAGNOSIS — I255 Ischemic cardiomyopathy: Secondary | ICD-10-CM | POA: Diagnosis not present

## 2021-03-08 DIAGNOSIS — E785 Hyperlipidemia, unspecified: Secondary | ICD-10-CM

## 2021-03-08 DIAGNOSIS — I5022 Chronic systolic (congestive) heart failure: Secondary | ICD-10-CM | POA: Diagnosis not present

## 2021-03-08 DIAGNOSIS — I4892 Unspecified atrial flutter: Secondary | ICD-10-CM

## 2021-03-08 DIAGNOSIS — R911 Solitary pulmonary nodule: Secondary | ICD-10-CM

## 2021-03-08 DIAGNOSIS — I2510 Atherosclerotic heart disease of native coronary artery without angina pectoris: Secondary | ICD-10-CM | POA: Diagnosis not present

## 2021-03-08 DIAGNOSIS — I63139 Cerebral infarction due to embolism of unspecified carotid artery: Secondary | ICD-10-CM

## 2021-03-08 DIAGNOSIS — R414 Neurologic neglect syndrome: Secondary | ICD-10-CM | POA: Diagnosis not present

## 2021-03-08 NOTE — Patient Instructions (Signed)
Medication Instructions:  No changes *If you need a refill on your cardiac medications before your next appointment, please call your pharmacy*   Lab Work: No Labs If you have labs (blood work) drawn today and your tests are completely normal, you will receive your results only by: [place] (if you have MyChart) OR A paper copy in the mail If you have any lab test that is abnormal or we need to change your treatment, we will call you to review the results.   Testing/Procedures: No Testing   Follow-Up: At [facility], you and your health needs are our priority.  As part of our continuing mission to provide you with exceptional heart care, we have created designated Provider Care Teams.  These Care Teams include your primary Cardiologist (physician) and Advanced Practice Providers (APPs -  Physician Assistants and Nurse Practitioners) who all work together to provide you with the care you need, when you need it.   Your next appointment:   2 month(s)  The format for your next appointment:   In Person  Provider:   Shelva Majestic, MD

## 2021-03-08 NOTE — Telephone Encounter (Signed)
Care Navigation team received referral for PT/OT. Confirmed w/ team lead that this should be deferred to nursing staff to enter orders/send referral. Also may be beneficial for this to go through PCP office to ensure ongoing management.   Westley Hummer, MSW, [place]  [phone]

## 2021-03-10 ENCOUNTER — Other Ambulatory Visit: Payer: Self-pay | Admitting: Physician Assistant

## 2021-03-11 ENCOUNTER — Telehealth: Payer: Medicare Other

## 2021-03-16 ENCOUNTER — Telehealth: Payer: Self-pay | Admitting: *Deleted

## 2021-03-16 NOTE — Chronic Care Management (AMB) (Signed)
  Care Management   Note  03/16/2021 Name: John [name] MRN: [id number] DOB: January 15, 1951  John [name] is a 70 y.o. year old male who is a primary care patient of [name], MD and is actively engaged with the care management team. I reached out to [name] by phone today to assist with re-scheduling an initial visit with the RN Case Manager  Follow up plan: Unsuccessful telephone outreach attempt made. A HIPAA compliant phone message was left for the patient providing contact information and requesting a return call.  The care management team will reach out to the patient again over the next 7 days.  If patient returns call to provider office, please advise to call [place] at [phone].  [place] Management  Direct Dial: [phone]

## 2021-03-21 ENCOUNTER — Other Ambulatory Visit: Payer: Self-pay | Admitting: Physician Assistant

## 2021-03-24 ENCOUNTER — Encounter: Payer: Medicare Other | Admitting: Cardiology

## 2021-03-24 NOTE — Chronic Care Management (AMB) (Signed)
  Care Management   Note  03/24/2021 Name: John [name] MRN: [id number] DOB: May 27, 1951  John [name] is a 70 y.o. year old male who is a primary care patient of [name], MD and is actively engaged with the care management team. I reached out to [name] by phone today to assist with re-scheduling an initial visit with the RN Case Manager  Follow up plan: Patient declines further follow up and engagement by the care management team. Appropriate care team members and provider have been notified via electronic communication.The care management team is available to follow up with the patient after provider conversation with the patient regarding recommendation for care management engagement and subsequent re-referral to the care management team.   [place] Management  Direct Dial: [phone]

## 2021-03-25 ENCOUNTER — Ambulatory Visit: Payer: Medicare Other | Admitting: Podiatry

## 2021-03-25 ENCOUNTER — Other Ambulatory Visit: Payer: Self-pay | Admitting: Cardiology

## 2021-03-25 NOTE — Telephone Encounter (Signed)
Prescription refill request for Eliquis received. Indication:Embolic stroke Last office visit:9/22 Scr:1.0 Age: 71 Weight:95.7 kg  Prescription refilled

## 2021-03-30 ENCOUNTER — Ambulatory Visit: Payer: Medicare Other | Admitting: Cardiovascular Disease

## 2021-03-30 ENCOUNTER — Other Ambulatory Visit: Payer: Self-pay

## 2021-03-30 ENCOUNTER — Encounter: Payer: Self-pay | Admitting: Pulmonary Disease

## 2021-03-30 ENCOUNTER — Ambulatory Visit (INDEPENDENT_AMBULATORY_CARE_PROVIDER_SITE_OTHER): Payer: Medicare Other | Admitting: Pulmonary Disease

## 2021-03-30 VITALS — BP 124/82 | HR 70 | Temp 98.2°F | Ht 72.0 in | Wt 215.0 lb

## 2021-03-30 DIAGNOSIS — Z87891 Personal history of nicotine dependence: Secondary | ICD-10-CM | POA: Diagnosis not present

## 2021-03-30 DIAGNOSIS — Z955 Presence of coronary angioplasty implant and graft: Secondary | ICD-10-CM

## 2021-03-30 DIAGNOSIS — I214 Non-ST elevation (NSTEMI) myocardial infarction: Secondary | ICD-10-CM

## 2021-03-30 DIAGNOSIS — Z7901 Long term (current) use of anticoagulants: Secondary | ICD-10-CM | POA: Diagnosis not present

## 2021-03-30 DIAGNOSIS — Z8673 Personal history of transient ischemic attack (TIA), and cerebral infarction without residual deficits: Secondary | ICD-10-CM | POA: Diagnosis not present

## 2021-03-30 DIAGNOSIS — R911 Solitary pulmonary nodule: Secondary | ICD-10-CM

## 2021-03-30 DIAGNOSIS — Z7902 Long term (current) use of antithrombotics/antiplatelets: Secondary | ICD-10-CM

## 2021-03-30 NOTE — Patient Instructions (Signed)
Thank you for visiting Dr. Valeta Harms at [facility]. Today we recommend the following:  Tentative Bronchoscopy date on 04/20/2021 or 04/21/2021 I will arrange anticoagulation needs once I get info from cardiology   Return in about 26 days (around 04/25/2021) for w/ Eric Form, NP .    Please do your part to reduce the spread of COVID-19.

## 2021-03-30 NOTE — Progress Notes (Signed)
Synopsis: Referred in November 2020 for nodule evaluation by [name], MD  Subjective:   PATIENT ID: John [name] GENDER: male DOB: 1950/08/17, MRN: [id number]  Chief Complaint  Patient presents with   [facility] f/u    This is a 70 year old gentleman 54-pack-year history of smoking, underwent CT scan imaging which revealed a left upper lobe lung nodule followed by [name].  Patient ultimately underwent PET scan imaging in September which revealed low-level PET uptake, SUV 1.4 within the nodule.  And continued follow-up proceeded.  Patient had a repeat super D noncontrasted imaging in November 2020 and referred to see me for evaluation of lung nodule.  Today in the office we discussed the patient's lung nodule imaging and reviewed the images back from the initial pictures in May 2020 that first discover the nodule.  He also has a stable contralateral nodule.  This nodule has been present since 2014.  As for the left upper lobe nodule it has started to evolve a little bit went from more solid to a subsolid form.  Otherwise the patient has no significant symptoms and feels as if his dyspnea is well controlled.  Patient denies fevers chills night sweats weight loss or hemoptysis. Retired from Interior and spatial designer, Chartered certified accountant for [employer].   OV 08/13/2019: Here today for CT scan follow-up of lung nodule.  Patient's respiratory symptoms are stable.  He has been using his Arnuity inhaler as well as albuterol as needed.  Has not needed this recently.  Stable use of Arnuity and rinsing of mouth in the morning.  Is able to work outside in his yard.  Working on some brush piles to help clean up the yard for the springtime.  Does get some shortness of breath with significant exertion but overall states he is pretty stable.  He did review his results in my chart for his lung nodule follow-up and is happy about the fact that the document stability.  Patient denies hemoptysis  weight loss fevers chills.  OV 08/02/2020: Here today for CT scan follow-up of lung nodule.  He has had this for several years originally followed by [name].  Has COPD with chronic bronchitis type symptoms.  Currently managed with Arnuity plus as needed albuterol.  Does not need refills of inhalers at this time.  He had a recent CT scan of the chest in July 26, 2020.  This follow-up CT shows a stable thoracic aneurysm at 4.2 cm.  He has small bilateral pulmonary nodules.  The left upper lobe nodule is stable in comparison to follow-up images.  He is a former smoker with a significant greater than 50+ pack year history.  We discussed enrollment in lung cancer screening moving forward.  OV 03/30/2021: Here today for follow-up after recent hospitalization.  His CT scan follow-up that was completed during his hospitalization showed significant rapid enlargement of his left upper lobe pulmonary nodule.  This is concerning for malignancy I reviewed this today with patient the office.  He was recently admitted however with a MI and had a drug-eluting stent placed by interventional cardiology.  At the time was also found to have a stroke.  He has now been discharged on Plavix plus Eliquis.  We need to work with his cardiologist regarding anticoagulation needs with planned biopsy.   Past Medical History:  Diagnosis Date   Atrial [facility])    s/p cardioversion   Coronary artery disease    Diabetes mellitus  GERD (gastroesophageal reflux disease)    History of nuclear stress test 04/04/2011   lexiscan; mod-large in size fixed inferolateral defect (scar); non-diagnostic for ischemia; low risk scan    Hyperlipidemia    Hypertension    Left foot drop    r/t past disk srugery - uses Kevlar brace   Myocardial infarction (HCC)    posterior MI   Shortness of breath    Sleep apnea    on CPAP; 04/28/2007 split-night - AHI during total sleep 44.43/hr and REM 72.56/hr     Family History  Problem  Relation Age of Onset   Heart attack Father      Past Surgical History:  Procedure Laterality Date   AMPUTATION TOE Left 02/10/2021   Procedure: AMPUTATION  LEFT SECOND TOE;  Surgeon: Edrick Kins, DPM;  Location: [place];  Service: Podiatry;  Laterality: Left;   [place]  2010   6 stents total   CARDIAC CATHETERIZATION  01/2000   percutaneous transluminal coronary balloon angioplasty of mid RCA stenotic lesion   CARDIAC CATHETERIZATION  06/2006   no stenting; ischemic cardiomyopathy, EF 40-45%   CARDIOVERSION N/A 07/28/2016   Procedure: CARDIOVERSION;  Surgeon: Troy Sine, MD;  Location: [place];  Service: Cardiovascular;  Laterality: N/A;   CORONARY ANGIOPLASTY  09/1998   mid-distal RCA balloon dilatation, 4.5 & 5.0 stents    CORONARY ANGIOPLASTY WITH STENT PLACEMENT  03/1994   angioplasty & stenting (non-DES) of circumflex/prox ramus intermedius   CORONARY ANGIOPLASTY WITH STENT PLACEMENT  10/1994   large iliac [id number] stent to RCA   [place]  12/2002   4.59mm stents x2 of RCA   CORONARY ANGIOPLASTY WITH STENT PLACEMENT  01/2005   cutting balloon arthrectomy of distal RCA & Cypher DES 3.5x13; cutting balloon arthrectomy of mid RCA with Cypher DES 3.5x18   CORONARY ANGIOPLASTY WITH STENT PLACEMENT  11/2008   stenting of mid RCA with 4.0x9mm driver, non-DES   CORONARY BALLOON ANGIOPLASTY N/A 02/15/2021   Procedure: CORONARY BALLOON ANGIOPLASTY;  Surgeon: Troy Sine, MD;  Location: [place] CV LAB;  Service: Cardiovascular;  Laterality: N/A;   ICD IMPLANT N/A 08/20/2020   Procedure: ICD IMPLANT;  Surgeon: Constance Haw, MD;  Location: [place] CV LAB;  Service: Cardiovascular;  Laterality: N/A;   INTRAVASCULAR PRESSURE WIRE/FFR STUDY N/A 03/02/2020   Procedure: INTRAVASCULAR PRESSURE WIRE/FFR STUDY;  Surgeon: Leonie Man, MD;  Location: [place] CV LAB;  Service: Cardiovascular;  Laterality:  N/A;   LEFT HEART CATH AND CORONARY ANGIOGRAPHY N/A 03/02/2020   Procedure: LEFT HEART CATH AND CORONARY ANGIOGRAPHY;  Surgeon: Leonie Man, MD;  Location: [place] CV LAB;  Service: Cardiovascular;  Laterality: N/A;   LEFT HEART CATH AND CORONARY ANGIOGRAPHY N/A 08/19/2020   Procedure: LEFT HEART CATH AND CORONARY ANGIOGRAPHY;  Surgeon: Lorretta Harp, MD;  Location: [place] CV LAB;  Service: Cardiovascular;  Laterality: N/A;   LEFT HEART CATH AND CORONARY ANGIOGRAPHY N/A 02/15/2021   Procedure: LEFT HEART CATH AND CORONARY ANGIOGRAPHY;  Surgeon: Troy Sine, MD;  Location: [place] CV LAB;  Service: Cardiovascular;  Laterality: N/A;   LEFT HEART CATHETERIZATION WITH CORONARY ANGIOGRAM N/A 02/27/2012   Procedure: LEFT HEART CATHETERIZATION WITH CORONARY ANGIOGRAM;  Surgeon: Lorretta Harp, MD;  Location: [facility] C[facility];  Service: Cardiovascular;  Laterality: N/A;   TRANSTHORACIC ECHOCARDIOGRAM  07/29/2010   EF 50=55%, mod inf wall hypokinesis & mild  post wall hypokinesis; LA mild-mod dilated; mild mitral annular calcif & mild MR; mild TR & elevated RV systolic pressure; AV mildly sclerotic; mild aortic root dilatation    V TACH ABLATION N/A 01/20/2021   Procedure: V TACH ABLATION;  Surgeon: Vickie Epley, MD;  Location: [place] CV LAB;  Service: Cardiovascular;  Laterality: N/A;    Social History   Socioeconomic History   Marital status: Married    Spouse name: Not on file   Number of children: 3   Years of education: Not on file   Highest education level: Not on file  Occupational History   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 [place]  Tobacco Use   Smoking status: Former    Packs/day: 1.00    Years: 50.00    Pack years: 50.00    Types: Cigarettes    Quit date: 07/20/2016    Years since quitting: 4.6   Smokeless tobacco: Never  Vaping Use   Vaping Use: Never used  Substance and Sexual Activity   Alcohol use: Not  Currently    Alcohol/week: 0.0 standard drinks   Drug use: No   Sexual activity: Yes  Other Topics Concern   Not on file  Social History Narrative   Not on file   Social Determinants of Health   Financial Resource Strain: Not on file  Food Insecurity: Not on file  Transportation Needs: Not on file  Physical Activity: Not on file  Stress: Not on file  Social Connections: Not on file  Intimate Partner Violence: Not on file     Allergies  Allergen Reactions   Omega-3 Fatty Acids Hives and Itching   Benazepril Other (See Comments)    hyperkalemia   Fish Allergy Itching     Outpatient Medications Prior to Visit  Medication Sig Dispense Refill   albuterol (VENTOLIN HFA) 108 (90 Base) MCG/ACT inhaler Inhale 2 puffs into the lungs every 6 (six) hours as needed for wheezing or shortness of breath. 1 each 6   clopidogrel (PLAVIX) 75 MG tablet Take 1 tablet (75 mg total) by mouth daily with breakfast.     ELIQUIS 5 MG TABS tablet TAKE 1 TABLET BY MOUTH TWICE A DAY 180 tablet 1   ENTRESTO 24-26 MG TAKE 1 TABLET BY MOUTH TWICE A DAY (Patient taking differently: Take 1 tablet by mouth 2 (two) times daily.) 60 tablet 6   Fluticasone Furoate (ARNUITY ELLIPTA) 200 MCG/ACT AEPB Inhale 1 puff into the lungs daily. 30 each 6   glucose blood (ONETOUCH ULTRA) test strip USE TO CHECK BLOOD SUGAR 3 TIMES A DAY (E11.9) 100 strip 2   insulin aspart (NOVOLOG FLEXPEN) 100 UNIT/ML FlexPen INJECT 5-20 UNITS SUBCUTANEOUSLY WITH LUNCH AND DINNER (Patient taking differently: Inject 6-8 Units into the skin daily as needed for high blood sugar.) 15 mL 1   Insulin Pen Needle (NOVOFINE) 32G X 6 MM MISC 1 each by Other route daily. 100 each 3   JARDIANCE 25 MG TABS tablet TAKE 1 TABLET BY MOUTH EVERY DAY (Patient taking differently: Take 25 mg by mouth daily.) 30 tablet 5   LEVEMIR FLEXTOUCH 100 UNIT/ML FlexPen INJECT 46 UNITS INTO THE SKIN DAILY. DX:E11.9 (Patient taking differently: Inject 46 Units into the skin  daily.) 15 mL 3   mexiletine (MEXITIL) 150 MG capsule Take 2 capsules (300 mg total) by mouth every 12 (twelve) hours. 120 capsule 5   montelukast (SINGULAIR) 10 MG tablet  TAKE 1 TABLET BY MOUTH EVERY DAY 90 tablet 3   nitroGLYCERIN (NITROSTAT) 0.4 MG SL tablet PLACE 1 TABLET (0.4 MG TOTAL) UNDER THE TONGUE EVERY 5 (FIVE) MINUTES AS NEEDED FOR CHEST PAIN. 25 tablet 1   rosuvastatin (CRESTOR) 40 MG tablet Take 1 tablet (40 mg total) by mouth daily.     sotalol (BETAPACE) 160 MG tablet Take 1 tablet (160 mg total) by mouth 2 (two) times daily. 60 tablet 6   acetaminophen (TYLENOL) 500 MG tablet Take 500 mg by mouth every 6 (six) hours as needed for moderate pain.     aspirin EC 81 MG EC tablet Take 1 tablet (81 mg total) by mouth daily. Swallow whole. 30 tablet 11   HYDROcodone-acetaminophen (NORCO/VICODIN) 5-325 MG tablet Take 1 tablet by mouth every 6 (six) hours as needed for severe pain. 10 tablet 0   ketoconazole (NIZORAL) 2 % shampoo Apply 1 application topically 2 (two) times a week.     metoprolol succinate (TOPROL-XL) 100 MG 24 hr tablet TAKE 1 TABLET (100 MG TOTAL) BY MOUTH IN THE MORNING AND AT BEDTIME. TAKE WITH OR IMMEDIATELY FOLLOWING A MEAL. TAKE IN THE MORNING 180 tablet 1   No facility-administered medications prior to visit.    Review of Systems  Constitutional:  Negative for chills, fever, malaise/fatigue and weight loss.  HENT:  Negative for hearing loss, sore throat and tinnitus.   Eyes:  Negative for blurred vision and double vision.  Respiratory:  Positive for shortness of breath. Negative for cough, hemoptysis, sputum production, wheezing and stridor.   Cardiovascular:  Negative for chest pain, palpitations, orthopnea, leg swelling and PND.  Gastrointestinal:  Negative for abdominal pain, constipation, diarrhea, heartburn, nausea and vomiting.  Genitourinary:  Negative for dysuria, hematuria and urgency.  Musculoskeletal:  Negative for joint pain and myalgias.  Skin:   Negative for itching and rash.  Neurological:  Positive for focal weakness and weakness. Negative for dizziness, tingling and headaches.  Endo/Heme/Allergies:  Negative for environmental allergies. Does not bruise/bleed easily.  Psychiatric/Behavioral:  Negative for depression. The patient is not nervous/anxious and does not have insomnia.   All other systems reviewed and are negative.   Objective:  Physical Exam Vitals reviewed.  Constitutional:      General: He is not in acute distress.    Appearance: He is well-developed.  HENT:     Head: Normocephalic and atraumatic.  Eyes:     General: No scleral icterus.    Conjunctiva/sclera: Conjunctivae normal.     Pupils: Pupils are equal, round, and reactive to light.  Neck:     Vascular: No JVD.     Trachea: No tracheal deviation.  Cardiovascular:     Rate and Rhythm: Normal rate and regular rhythm.     Heart sounds: Normal heart sounds. No murmur heard. Pulmonary:     Effort: Pulmonary effort is normal. No tachypnea, accessory muscle usage or respiratory distress.     Breath sounds: No stridor. No wheezing, rhonchi or rales.     Comments: Diminished breath sounds bilaterally Abdominal:     General: Bowel sounds are normal. There is no distension.     Palpations: Abdomen is soft.     Tenderness: There is no abdominal tenderness.  Musculoskeletal:        General: No tenderness.     Cervical back: Neck supple.  Lymphadenopathy:     Cervical: No cervical adenopathy.  Skin:    General: Skin is warm and dry.  Capillary Refill: Capillary refill takes less than 2 seconds.     Findings: No rash.  Neurological:     Mental Status: He is alert and oriented to person, place, and time.     Gait: Gait abnormal.     Comments: Left ankle brace  Psychiatric:        Behavior: Behavior normal.     Vitals:   03/30/21 1538  BP: 124/82  Pulse: 70  Temp: 98.2 F (36.8 C)  TempSrc: Oral  SpO2: 98%  Weight: 215 lb (97.5 kg)  Height:  6' (1.829 m)   98% on RA BMI Readings from Last 3 Encounters:  03/30/21 29.16 kg/m  03/08/21 28.62 kg/m  03/03/21 28.48 kg/m   Wt Readings from Last 3 Encounters:  03/30/21 215 lb (97.5 kg)  03/08/21 211 lb (95.7 kg)  03/03/21 210 lb (95.3 kg)     CBC    Component Value Date/Time   WBC 7.3 02/24/2021 1058   RBC 5.34 02/24/2021 1058   HGB 16.2 02/24/2021 1058   HGB 16.2 02/01/2021 1125   HCT 49.5 02/24/2021 1058   HCT 49.2 02/01/2021 1125   PLT 262 02/24/2021 1058   PLT 161 02/01/2021 1125   MCV 92.7 02/24/2021 1058   MCV 90 02/01/2021 1125   MCH 30.3 02/24/2021 1058   MCHC 32.7 02/24/2021 1058   RDW 13.8 02/24/2021 1058   RDW 13.0 02/01/2021 1125   LYMPHSABS 1.7 02/19/2021 0507   MONOABS 0.4 02/19/2021 0507   EOSABS 0.3 02/19/2021 0507   BASOSABS 0.1 02/19/2021 0507     Chest Imaging: 11/07/2018: CT chest imaging Initial imaging showed a 1 cm pulmonary nodule left upper lobe concerning for malignancy.  Patient ultimately sent forNuclear medicine imaging.  03/12/2019: Nuclear medicine pet imaging Lingular nodule with no hypermetabolism.  However CT morphology persistent stable in size but favors a small adenocarcinoma.  Per nuclear medicine report.  05/07/2019 super D CT imaging: CT imaging revealed feels a left upper lobe rounded pulmonary nodule that has now began to evolve some.  There is clusters of nodules in this location.  After review of these images in comparison to previous is less likely a malignancy however it may be caught in evolution as it starts to lobulated into a larger mass.  This was discussed with the patient.The patient's images have been independently reviewed by me.    07/29/2019 super D CT imaging: Patient with a stable 1.5 cm subsolid left upper lobe nodule.  This was previously solid on comparison and therefore has some degenerative-like changes.  It is still possible that we are dealing with a malignancy however would recommend continued  follow-up.. The patient's images have been independently reviewed by me.    07/26/2020: CT chest: Stable left upper lobe subsolid lung nodule.  Other small pulmonary nodules. The patient's images have been independently reviewed by me.   02/16/2021: CT chest with enlarging upper lobe nodule concerning for malignancy. The patient's images have been independently reviewed by me.    Pulmonary Functions Testing Results: PFT Results Latest Ref Rng & Units 03/05/2019 03/14/2018 11/29/2017  FVC-Pre L 4.27 4.29 3.88  FVC-Predicted Pre % 86 86 77  FVC-Post L - - 3.82  FVC-Predicted Post % - - 76  Pre FEV1/FVC % % 80 81 84  Post FEV1/FCV % % - - 85  FEV1-Pre L 3.42 3.48 3.25  FEV1-Predicted Pre % 93 94 88  FEV1-Post L - - 3.26  DLCO uncorrected ml/min/mmHg 26.65 19.62 19.16  DLCO UNC% % 93 53 52  DLVA Predicted % 94 64 65    FeNO: none   Pathology: none   Echocardiogram:   1. The left ventricle has low normal systolic function, with an ejection fraction of 50-55%. The cavity size was moderately dilated. Left ventricular diastolic Doppler parameters are consistent with impaired relaxation.  2. The right ventricle has normal systolic function. The cavity was normal. There is no increase in right ventricular wall thickness.  3. The mitral valve is normal in structure.  4. The tricuspid valve is normal in structure.  5. The aortic valve is tricuspid Mild thickening of the aortic valve.  6. The pulmonic valve was normal in structure. Pulmonic valve regurgitation is mild by color flow Doppler.  7. LVEF is approximately 50 to 55% with basal inferior/inferolateral hypokinesis.  Heart Catheterization: none     Assessment & Plan:     ICD-10-CM   1. Lung nodule  R91.1 Procedural/ Surgical Case Request: VIDEO BRONCHOSCOPY WITH ENDOBRONCHIAL NAVIGATION    CT Super D Chest Wo Contrast    NM PET Image Initial (PI) Skull Base To Thigh (F-18 FDG)    Ambulatory referral to Pulmonology    2. Former  smoker  Z87.891     3. NSTEMI (non-ST elevated myocardial infarction) ([place])  I21.4     4. S/P coronary artery stent placement  Z95.5     5. On continuous oral anticoagulation  Z79.01     6. Antiplatelet or antithrombotic long-term use  Z79.02     7. History of CVA (cerebrovascular accident)  Z86.73       Discussion:  This is a 70 year old gentleman, longstanding history of smoking, 54 years, quit in 2018.  Asthma as a child, reassuring PFTs with chronic bronchitis symptoms managed with Arnuity and as needed albuterol.  He has been followed for a lung nodule recent CT imaging showed significant rapid enlargement of left upper lobe lesion.  This is concerning for primary malignancy.  Unfortunately recently suffered a NSTEMI requiring cardiac catheterization and stent placement as well as a recent CVA.  He is on antiplatelets and continuous anticoagulation.  Plan: I will discuss with cardiology colleagues about appropriate regimen for consideration for bronchoscopy. I do think that he needs to undergo biopsy.  The rapid growth of this nodule within the chest would suggest neuroendocrine features.  And a potential diagnosis of small cell malignancy.  This would be pertinent as this would be best treated with systemic chemotherapy versus radiation treatments which could be potentially done empirically. However due to the behavior of the nodule I do think tissue biopsy is warranted in this setting. I discussed this with the patient in the office today. I will reach out to his cardiology team to help determine next best steps. I assume potentially an admission to the hospital with bridging would be the most appropriate due to his recent interventions.  Tentative bronchoscopy date would be November 2. I will help coordinate with cardiology and potentially the hospitalist service for consideration of admission and consultation by the cardiology service to help manage anticoagulation and  antiplatelet me.  I think he needs to be off of Plavix for at least 5 days if he needs to be on antiplatelet he will need to be on a IV version to help with bridging.  As for his Eliquis he would need to be potentially bridged with Lovenox or heparin.  I have ordered a super D CT as well as a nuclear medicine  PET scan.    Current Outpatient Medications:    albuterol (VENTOLIN HFA) 108 (90 Base) MCG/ACT inhaler, Inhale 2 puffs into the lungs every 6 (six) hours as needed for wheezing or shortness of breath., Disp: 1 each, Rfl: 6   clopidogrel (PLAVIX) 75 MG tablet, Take 1 tablet (75 mg total) by mouth daily with breakfast., Disp: , Rfl:    ELIQUIS 5 MG TABS tablet, TAKE 1 TABLET BY MOUTH TWICE A DAY, Disp: 180 tablet, Rfl: 1   ENTRESTO 24-26 MG, TAKE 1 TABLET BY MOUTH TWICE A DAY (Patient taking differently: Take 1 tablet by mouth 2 (two) times daily.), Disp: 60 tablet, Rfl: 6   Fluticasone Furoate (ARNUITY ELLIPTA) 200 MCG/ACT AEPB, Inhale 1 puff into the lungs daily., Disp: 30 each, Rfl: 6   glucose blood (ONETOUCH ULTRA) test strip, USE TO CHECK BLOOD SUGAR 3 TIMES A DAY (E11.9), Disp: 100 strip, Rfl: 2   insulin aspart (NOVOLOG FLEXPEN) 100 UNIT/ML FlexPen, INJECT 5-20 UNITS SUBCUTANEOUSLY WITH LUNCH AND DINNER (Patient taking differently: Inject 6-8 Units into the skin daily as needed for high blood sugar.), Disp: 15 mL, Rfl: 1   Insulin Pen Needle (NOVOFINE) 32G X 6 MM MISC, 1 each by Other route daily., Disp: 100 each, Rfl: 3   JARDIANCE 25 MG TABS tablet, TAKE 1 TABLET BY MOUTH EVERY DAY (Patient taking differently: Take 25 mg by mouth daily.), Disp: 30 tablet, Rfl: 5   LEVEMIR FLEXTOUCH 100 UNIT/ML FlexPen, INJECT 46 UNITS INTO THE SKIN DAILY. DX:E11.9 (Patient taking differently: Inject 46 Units into the skin daily.), Disp: 15 mL, Rfl: 3   mexiletine (MEXITIL) 150 MG capsule, Take 2 capsules (300 mg total) by mouth every 12 (twelve) hours., Disp: 120 capsule, Rfl: 5   montelukast  (SINGULAIR) 10 MG tablet, TAKE 1 TABLET BY MOUTH EVERY DAY, Disp: 90 tablet, Rfl: 3   nitroGLYCERIN (NITROSTAT) 0.4 MG SL tablet, PLACE 1 TABLET (0.4 MG TOTAL) UNDER THE TONGUE EVERY 5 (FIVE) MINUTES AS NEEDED FOR CHEST PAIN., Disp: 25 tablet, Rfl: 1   rosuvastatin (CRESTOR) 40 MG tablet, Take 1 tablet (40 mg total) by mouth daily., Disp: , Rfl:    sotalol (BETAPACE) 160 MG tablet, Take 1 tablet (160 mg total) by mouth 2 (two) times daily., Disp: 60 tablet, Rfl: 6  I spent 44 minutes dedicated to the care of this patient on the date of this encounter to include pre-visit review of records, face-to-face time with the patient discussing conditions above, post visit ordering of testing, clinical documentation with the electronic health record, making appropriate referrals as documented, and communicating necessary findings to members of the patients care team.    Garner Nash, DO [facility] 03/30/2021 6:58 PM

## 2021-03-31 ENCOUNTER — Encounter: Payer: Self-pay | Admitting: Cardiology

## 2021-03-31 ENCOUNTER — Encounter: Payer: Self-pay | Admitting: Pulmonary Disease

## 2021-03-31 ENCOUNTER — Ambulatory Visit (INDEPENDENT_AMBULATORY_CARE_PROVIDER_SITE_OTHER): Payer: Medicare Other | Admitting: Cardiology

## 2021-03-31 VITALS — BP 146/84 | HR 83 | Ht 72.0 in | Wt 216.6 lb

## 2021-03-31 DIAGNOSIS — I472 Ventricular tachycardia, unspecified: Secondary | ICD-10-CM | POA: Diagnosis not present

## 2021-03-31 DIAGNOSIS — I2510 Atherosclerotic heart disease of native coronary artery without angina pectoris: Secondary | ICD-10-CM | POA: Diagnosis not present

## 2021-03-31 NOTE — Addendum Note (Signed)
Addended by: Valerie Salts on: 03/31/2021 09:34 AM   Modules accepted: Orders

## 2021-03-31 NOTE — Patient Instructions (Signed)
Medication Instructions:  Your physician recommends that you continue on your current medications as directed. Please refer to the Current Medication list given to you today.  *If you need a refill on your cardiac medications before your next appointment, please call your pharmacy*   Lab Work: None ordered If you have labs (blood work) drawn today and your tests are completely normal, you will receive your results only by: [place] (if you have MyChart) OR A paper copy in the mail If you have any lab test that is abnormal or we need to change your treatment, we will call you to review the results.   Testing/Procedures: None ordered   Follow-Up: At [facility], you and your health needs are our priority.  As part of our continuing mission to provide you with exceptional heart care, we have created designated Provider Care Teams.  These Care Teams include your primary Cardiologist (physician) and Advanced Practice Providers (APPs -  Physician Assistants and Nurse Practitioners) who all work together to provide you with the care you need, when you need it.   Your next appointment:   3 month(s)  The format for your next appointment:   In Person  Provider:   Allegra Lai, MD    Thank you for choosing [facility]!!   Trinidad Curet, RN [phone]   Other Instructions

## 2021-03-31 NOTE — Progress Notes (Signed)
Electrophysiology Office Note   Date:  03/31/2021   ID:  John [name], John [name] 05-Apr-1951, MRN [id number]  PCP:  [name], MD  Cardiologist:  Claiborne Billings Primary Electrophysiologist:  Kie Calvin Meredith Leeds, MD    Chief Complaint: VT   History of Present Illness: John [name] is a 70 y.o. male who is being seen today for the evaluation of VT at the request of [name], MD. Presenting today for electrophysiology evaluation.  He has a history significant for coronary artery disease status post multiple PCI's to the RCA and circumflex, ischemic cardiomyopathy, hypertension, hyperlipidemia, and diabetes.  He also has OSA on CPAP, ILD, atrial flutter, and tobacco abuse.  He quit in 2019.  He presented to the hospital 08/18/2020 with chest pain and palpitation.  He was found to have a wide-complex tachycardia with ventricular tachycardia.  He was cardioverted in the emergency room and started on amiodarone and lidocaine.  Amiodarone was stopped secondary to bradycardia.  He is status post Medtronic ICD implanted 08/20/2020.  Since then, he has had multiple episodes of ventricular tachycardia.  He had a VT ablation 01/20/2021.  He is continue to have episodes of ventricular tachycardia despite both sotalol and mexiletine.  He presented to the hospital August 2022 with ACS.  He had ballooning of his RCA with stents placed in a large AV branch.  Unfortunately, he had a stroke around the time of his procedure.  During the work-up for his stroke, he was found to have a left upper lobe mass.  He has potential plans for biopsy.  Today, denies symptoms of palpitations, chest pain, shortness of breath, orthopnea, PND, lower extremity edema, claudication, dizziness, presyncope, syncope, bleeding, or neurologic sequela. The patient is tolerating medications without difficulties.  Since hospital discharge, he is continued to have episodes of ventricular tachycardia.  Episodes of lasted up to an hour and  have all been around 120 bpm or slower.  He has received ATP which is converted him to sinus rhythm at times.  He is mildly aware of his tachycardia.  Past Medical History:  Diagnosis Date   Atrial flutter ([facility])    s/p cardioversion   Coronary artery disease    Diabetes mellitus    GERD (gastroesophageal reflux disease)    History of nuclear stress test 04/04/2011   lexiscan; mod-large in size fixed inferolateral defect (scar); non-diagnostic for ischemia; low risk scan    Hyperlipidemia    Hypertension    Left foot drop    r/t past disk srugery - uses Kevlar brace   Myocardial infarction ([facility])    posterior MI   Shortness of breath    Sleep apnea    on CPAP; 04/28/2007 split-night - AHI during total sleep 44.43/hr and REM 72.56/hr   Past Surgical History:  Procedure Laterality Date   AMPUTATION TOE Left 02/10/2021   Procedure: AMPUTATION  LEFT SECOND TOE;  Surgeon: Edrick Kins, DPM;  Location: [facility];  Service: Podiatry;  Laterality: Left;   [place]  2010   6 stents total   CARDIAC CATHETERIZATION  01/2000   percutaneous transluminal coronary balloon angioplasty of mid RCA stenotic lesion   CARDIAC CATHETERIZATION  06/2006   no stenting; ischemic cardiomyopathy, EF 40-45%   CARDIOVERSION N/A 07/28/2016   Procedure: CARDIOVERSION;  Surgeon: Troy Sine, MD;  Location: [facility];  Service: Cardiovascular;  Laterality: N/A;   CORONARY ANGIOPLASTY  09/1998   mid-distal RCA balloon  dilatation, 4.5 & 5.0 stents    CORONARY ANGIOPLASTY WITH STENT PLACEMENT  03/1994   angioplasty & stenting (non-DES) of circumflex/prox ramus intermedius   CORONARY ANGIOPLASTY WITH STENT PLACEMENT  10/1994   large iliac [id number] stent to RCA   CORONARY ANGIOPLASTY WITH STENT PLACEMENT  12/2002   4.92mm stents x2 of RCA   CORONARY ANGIOPLASTY WITH STENT PLACEMENT  01/2005   cutting balloon arthrectomy of distal RCA & Cypher DES 3.5x13; cutting balloon arthrectomy of  mid RCA with Cypher DES 3.5x18   CORONARY ANGIOPLASTY WITH STENT PLACEMENT  11/2008   stenting of mid RCA with 4.0x69mm driver, non-DES   CORONARY BALLOON ANGIOPLASTY N/A 02/15/2021   Procedure: CORONARY BALLOON ANGIOPLASTY;  Surgeon: Troy Sine, MD;  Location: [facility];  Service: Cardiovascular;  Laterality: N/A;   ICD IMPLANT N/A 08/20/2020   Procedure: ICD IMPLANT;  Surgeon: Constance Haw, MD;  Location: [facility];  Service: Cardiovascular;  Laterality: N/A;   INTRAVASCULAR PRESSURE WIRE/FFR STUDY N/A 03/02/2020   Procedure: INTRAVASCULAR PRESSURE WIRE/FFR STUDY;  Surgeon: Leonie Man, MD;  Location: [facility];  Service: Cardiovascular;  Laterality: N/A;   LEFT HEART CATH AND CORONARY ANGIOGRAPHY N/A 03/02/2020   Procedure: LEFT HEART CATH AND CORONARY ANGIOGRAPHY;  Surgeon: Leonie Man, MD;  Location: [facility];  Service: Cardiovascular;  Laterality: N/A;   LEFT HEART CATH AND CORONARY ANGIOGRAPHY N/A 08/19/2020   Procedure: LEFT HEART CATH AND CORONARY ANGIOGRAPHY;  Surgeon: Lorretta Harp, MD;  Location: [facility];  Service: Cardiovascular;  Laterality: N/A;   LEFT HEART CATH AND CORONARY ANGIOGRAPHY N/A 02/15/2021   Procedure: LEFT HEART CATH AND CORONARY ANGIOGRAPHY;  Surgeon: Troy Sine, MD;  Location: [facility];  Service: Cardiovascular;  Laterality: N/A;   LEFT HEART CATHETERIZATION WITH CORONARY ANGIOGRAM N/A 02/27/2012   Procedure: LEFT HEART CATHETERIZATION WITH CORONARY ANGIOGRAM;  Surgeon: Lorretta Harp, MD;  Location[facility];  Service: Cardiovascular;  Laterality: N/A;   TRANSTHORACIC ECHOCARDIOGRAM  07/29/2010   EF 50=55%, mod inf wall hypokinesis & mild post wall hypokinesis; LA mild-mod dilated; mild mitral annular calcif & mild MR; mild TR & elevated RV systolic pressure; AV mildly sclerotic; mild aortic root dilatation    V TACH ABLATION N/A 01/20/2021   Procedure: V TACH ABLATION;  Surgeon:  Vickie Epley, MD;  Location: [facility];  Service: Cardiovascular;  Laterality: N/A;     Current Outpatient Medications  Medication Sig Dispense Refill   albuterol (VENTOLIN HFA) 108 (90 Base) MCG/ACT inhaler Inhale 2 puffs into the lungs every 6 (six) hours as needed for wheezing or shortness of breath. 1 each 6   clopidogrel (PLAVIX) 75 MG tablet Take 1 tablet (75 mg total) by mouth daily with breakfast.     ELIQUIS 5 MG TABS tablet TAKE 1 TABLET BY MOUTH TWICE A DAY 180 tablet 1   ENTRESTO 24-26 MG TAKE 1 TABLET BY MOUTH TWICE A DAY (Patient taking differently: Take 1 tablet by mouth 2 (two) times daily.) 60 tablet 6   Fluticasone Furoate (ARNUITY ELLIPTA) 200 MCG/ACT AEPB Inhale 1 puff into the lungs daily. 30 each 6   glucose blood (ONETOUCH ULTRA) test strip USE TO CHECK BLOOD SUGAR 3 TIMES A DAY (E11.9) 100 strip 2   insulin aspart (NOVOLOG FLEXPEN) 100 UNIT/ML FlexPen INJECT 5-20 UNITS SUBCUTANEOUSLY WITH LUNCH AND DINNER (Patient taking differently: Inject 6-8 Units into the skin  daily as needed for high blood sugar.) 15 mL 1   Insulin Pen Needle (NOVOFINE) 32G X 6 MM MISC 1 each by Other route daily. 100 each 3   JARDIANCE 25 MG TABS tablet TAKE 1 TABLET BY MOUTH EVERY DAY (Patient taking differently: Take 25 mg by mouth daily.) 30 tablet 5   LEVEMIR FLEXTOUCH 100 UNIT/ML FlexPen INJECT 46 UNITS INTO THE SKIN DAILY. DX:E11.9 (Patient taking differently: Inject 46 Units into the skin daily.) 15 mL 3   mexiletine (MEXITIL) 150 MG capsule Take 2 capsules (300 mg total) by mouth every 12 (twelve) hours. 120 capsule 5   montelukast (SINGULAIR) 10 MG tablet TAKE 1 TABLET BY MOUTH EVERY DAY 90 tablet 3   nitroGLYCERIN (NITROSTAT) 0.4 MG SL tablet PLACE 1 TABLET (0.4 MG TOTAL) UNDER THE TONGUE EVERY 5 (FIVE) MINUTES AS NEEDED FOR CHEST PAIN. 25 tablet 1   rosuvastatin (CRESTOR) 40 MG tablet Take 1 tablet (40 mg total) by mouth daily.     sotalol (BETAPACE) 160 MG tablet Take 1  tablet (160 mg total) by mouth 2 (two) times daily. 60 tablet 6   No current facility-administered medications for this visit.    Allergies:   Omega-3 fatty acids, Benazepril, and Fish allergy   Social History:  The patient  reports that he quit smoking about 4 years ago. His smoking use included cigarettes. He has a 50.00 pack-year smoking history. He has never used smokeless tobacco. He reports that he does not currently use alcohol. He reports that he does not use drugs.   Family History:  The patient's family history includes Heart attack in his father.   ROS:  Please see the history of present illness.   Otherwise, review of systems is positive for none.   All other systems are reviewed and negative.   PHYSICAL EXAM: VS:  BP (!) 146/84   Pulse 83   Ht 6' (1.829 m)   Wt 216 lb 9.6 oz (98.2 kg)   SpO2 98%   BMI 29.38 kg/m  , BMI Body mass index is 29.38 kg/m. GEN: Well nourished, well developed, in no acute distress  HEENT: normal  Neck: no JVD, carotid bruits, or masses Cardiac: RRR; no murmurs, rubs, or gallops,no edema  Respiratory:  clear to auscultation bilaterally, normal work of breathing GI: soft, nontender, nondistended, + BS MS: no deformity or atrophy  Skin: warm and dry, device site well healed Neuro:  Strength and sensation are intact Psych: euthymic mood, full affect  EKG:  EKG is ordered today. Personal review of the ekg ordered shows sinus rhythm, rate 75  Personal review of the device interrogation today. Results in [facility]: 01/17/2021: B Natriuretic Peptide 169.7 01/18/2021: TSH 0.404 02/11/2021: Magnesium 2.2 02/19/2021: ALT 17 02/24/2021: Hemoglobin 16.2; Platelets 262 02/25/2021: BUN 18; Creatinine, Ser 1.05; Potassium 3.6; Sodium 141    Lipid Panel     Component Value Date/Time   CHOL 101 02/15/2021 0342   CHOL 115 01/24/2019 0930   TRIG 80 02/15/2021 0342   HDL 34 (L) 02/15/2021 0342   HDL 33 (L) 01/24/2019 0930   CHOLHDL 3.0  02/15/2021 0342   VLDL 16 02/15/2021 0342   LDLCALC 51 02/15/2021 0342   LDLCALC 50 09/15/2019 1538     Wt Readings from Last 3 Encounters:  03/31/21 216 lb 9.6 oz (98.2 kg)  03/30/21 215 lb (97.5 kg)  03/08/21 211 lb (95.7 kg)      Other studies Reviewed: Additional  studies/ records that were reviewed today include: LHC 08/19/20  Review of the above records today demonstrates:  Prox RCA to Dist RCA lesion is 15% stenosed. Ost RCA to Prox RCA lesion is 40% stenosed. Ramus lesion is 60% stenosed. Dist RCA lesion is 45% stenosed.  TTE 03/01/20  1. Left ventricular ejection fraction, by estimation, is 45 to 50%. The  left ventricle has mildly decreased function. The left ventricle  demonstrates global hypokinesis. Left ventricular diastolic parameters are  indeterminate.   2. Right ventricular systolic function is normal. The right ventricular  size is normal.   3. Left atrial size was severely dilated.   4. The mitral valve is normal in structure. Trivial mitral valve  regurgitation. No evidence of mitral stenosis.   5. The aortic valve is tricuspid. Aortic valve regurgitation is not  visualized. No aortic stenosis is present.   6. Aortic dilatation noted. There is mild dilatation of the aortic root,  measuring 41 mm.   7. The inferior vena cava is dilated in size with >50% respiratory  variability, suggesting right atrial pressure of 8 mmHg.   ASSESSMENT AND PLAN:  1.  Ventricular tachycardia: Has continued to have episodes of ventricular tachycardia, though the rates have slowed.  He is status post Medtronic ICD implanted 08/20/2020.  Currently on sotalol 160 mg twice daily, mexiletine 300 mg daily.  In the past, he had issues with amiodarone and PFTs.  I Jazzie Trampe speak with his pulmonologist to see whether or not restarting amiodarone would be reasonable.  He has had an ablation for ventricular tachycardia, but unfortunately has continued to have episodes.  2.  Atrial flutter:  Currently on Eliquis.  CHA2DS2-VASc of 5.  3.  Coronary artery disease: Presented to the hospital August 2022 with ACS.  Status post stent of a large branch of the RCA.  No current chest pain.  Continue aspirin and Plavix.  4.  Chronic systolic heart failure due to ischemic cardiomyopathy: Ejection fraction mildly reduced.  Continue beta-blocker and Entresto.  On a very high dose of metoprolol as well as sotalol.  Continue current management.  5.  Lung mass: He has plan for biopsy.  We [facility] need to discuss the timing of biopsy with pulmonology which complicates his current antiplatelet medications.  Current medicines are reviewed at length with the patient today.   The patient does not have concerns regarding his medicines.  The following changes were made today: none  Labs/ tests ordered today include:  Orders Placed This Encounter  Procedures   EKG 12-Lead       Disposition:   FU with Darah Simkin 3 months  Signed, Pascha Fogal Meredith Leeds, MD  03/31/2021 4:15 PM     [address] [place] [id number] [phone] (office) [phone] (fax)

## 2021-04-08 ENCOUNTER — Telehealth: Payer: Self-pay | Admitting: Pulmonary Disease

## 2021-04-08 NOTE — Telephone Encounter (Signed)
PCCM:  Dr. Claiborne Billings I was told is out of town this week. I will reach out to pharmacy.   Thanks  BLI

## 2021-04-08 NOTE — Telephone Encounter (Signed)
Pt calling because he was supposed to hear today about a procedure he is supposed to have 11/2. Looks like bronch is scheduled for 11/2. Pt states we were supposed to contact his cardiologist and that hasn't been done and it was supposed to be done over a week ago. Please advise [phone]

## 2021-04-08 NOTE — Telephone Encounter (Signed)
BI please advise on contacting the pts cardiologist.  Is there something that we need to send to the cardiologist?  thanks

## 2021-04-11 ENCOUNTER — Telehealth: Payer: Self-pay | Admitting: Pulmonary Disease

## 2021-04-11 ENCOUNTER — Other Ambulatory Visit: Payer: Self-pay | Admitting: *Deleted

## 2021-04-11 DIAGNOSIS — Z955 Presence of coronary angioplasty implant and graft: Secondary | ICD-10-CM

## 2021-04-11 DIAGNOSIS — Z7902 Long term (current) use of antithrombotics/antiplatelets: Secondary | ICD-10-CM

## 2021-04-11 DIAGNOSIS — R911 Solitary pulmonary nodule: Secondary | ICD-10-CM

## 2021-04-11 DIAGNOSIS — Z7901 Long term (current) use of anticoagulants: Secondary | ICD-10-CM

## 2021-04-11 DIAGNOSIS — I214 Non-ST elevation (NSTEMI) myocardial infarction: Secondary | ICD-10-CM

## 2021-04-11 NOTE — Telephone Encounter (Signed)
Garner Nash, DO     12:48 PM Note PCCM:  After discussion with cardiology and cardiac pharmacy we all agree that the patient is high risk after recent DES placement.  Would recommend IV antiplatelet bridging.  Patient will need to be admitted for 5 full days prior to procedure.  Plan: Admission to hospital April 21, 2021 Last dose of Plavix April 20, 2021 Upon admission we will consult cardiology and cardiac pharmacy for management of IV antiplatelet regimen. Can continue anticoagulation with Eliquis which will need to be held 2 full days prior to procedure.  Procedure date will be 04/26/2021, [place] endoscopy.   We will need to call [facility] bed placement to ensure bed availability for that morning of 04/21/2021.  Garner Nash, DO [facility] 04/11/2021 12:48 PM       I called the patient and he wants to clarify on what day he will be going into the hospital.   Patient also wants to what day he will need to stop his Eliquis and is he needing to do anything else prior to his procedure. He just wants to be perfectly clear.  Please advise.

## 2021-04-11 NOTE — Telephone Encounter (Signed)
I called the patient and gave him the recommendations per Dr. Valeta Harms. He voices understanding and did not have any additional questions. Nothing further needed.

## 2021-04-11 NOTE — Telephone Encounter (Signed)
PCCM:  After discussion with cardiology and cardiac pharmacy we all agree that the patient is high risk after recent DES placement.  Would recommend IV antiplatelet bridging.  Patient will need to be admitted for 5 full days prior to procedure.  Plan: Admission to hospital April 21, 2021 Last dose of Plavix April 20, 2021 Upon admission we will consult cardiology and cardiac pharmacy for management of IV antiplatelet regimen. Can continue anticoagulation with Eliquis which will need to be held 2 full days prior to procedure.  Procedure date will be 04/26/2021, [facility] endoscopy.  We will need to call [facility] bed placement to ensure bed availability for that morning of 04/21/2021.  Garner Nash, DO [facility] 04/11/2021 12:48 PM

## 2021-04-11 NOTE — Telephone Encounter (Signed)
See phone note from 04/11/21. Patient has been made aware.

## 2021-04-13 ENCOUNTER — Telehealth: Payer: Self-pay | Admitting: Cardiovascular Disease

## 2021-04-13 ENCOUNTER — Telehealth: Payer: Self-pay | Admitting: Cardiology

## 2021-04-13 ENCOUNTER — Ambulatory Visit (INDEPENDENT_AMBULATORY_CARE_PROVIDER_SITE_OTHER): Payer: Medicare Other

## 2021-04-13 DIAGNOSIS — I472 Ventricular tachycardia, unspecified: Secondary | ICD-10-CM | POA: Diagnosis not present

## 2021-04-13 LAB — CUP PACEART REMOTE DEVICE CHECK
Battery Remaining Longevity: 109 mo
Battery Voltage: 3.02 V
Brady Statistic AP VP Percent: 11.46 %
Brady Statistic AP VS Percent: 69.74 %
Brady Statistic AS VP Percent: 0.87 %
Brady Statistic AS VS Percent: 17.93 %
Brady Statistic RA Percent Paced: 66.95 %
Brady Statistic RV Percent Paced: 10.81 %
Date Time Interrogation Session: 20221026080637
HighPow Impedance: 68 Ohm
Implantable Lead Implant Date: 20220304
Implantable Lead Implant Date: 20220304
Implantable Lead Location: 753859
Implantable Lead Location: 753860
Implantable Lead Model: 5076
Implantable Pulse Generator Implant Date: 20220304
Lead Channel Impedance Value: 399 Ohm
Lead Channel Impedance Value: 494 Ohm
Lead Channel Impedance Value: 513 Ohm
Lead Channel Pacing Threshold Amplitude: 0.625 V
Lead Channel Pacing Threshold Amplitude: 0.75 V
Lead Channel Pacing Threshold Pulse Width: 0.4 ms
Lead Channel Pacing Threshold Pulse Width: 0.4 ms
Lead Channel Sensing Intrinsic Amplitude: 2.625 mV
Lead Channel Sensing Intrinsic Amplitude: 2.625 mV
Lead Channel Sensing Intrinsic Amplitude: 22 mV
Lead Channel Sensing Intrinsic Amplitude: 22 mV
Lead Channel Setting Pacing Amplitude: 1.5 V
Lead Channel Setting Pacing Amplitude: 2 V
Lead Channel Setting Pacing Pulse Width: 0.4 ms
Lead Channel Setting Sensing Sensitivity: 0.3 mV

## 2021-04-13 NOTE — Telephone Encounter (Signed)
Upon further review of report noted- Over the last 13 days there have been 59 VT, 39 monitored VT, 3,384 NSVT 57 of 59 events pace terminated. Pt. was seen in the office 10/13 and is mildly aware of the VT Hx of VT ablation 8/4, Sotalol, mexiletine prescribed 82 SVT events as well  Spoke again with patient. The symptoms he had yesterday were palpitations and light headedness.  Updated result with symptoms for MD, noting pt concerned about upcoming procedure on his lungs.

## 2021-04-13 NOTE — Telephone Encounter (Signed)
Patient has sent a transmission and would like a call back for someone to go over it with him

## 2021-04-13 NOTE — Telephone Encounter (Addendum)
Spoke with pt, transmission reviewed.  Noted that it is scheduled transmission and will be forwarded to MD for review.    Pt reports he had episode of SOB and palpitations yesterday.  He denies any current symptoms.     Advised report does not show anything new for him.  Although Optivol is trending upward, still below threshold. Pt does not have any prescribed diuretics.    Pt reports he is having testing done tomorrow on his lung function.

## 2021-04-13 NOTE — Telephone Encounter (Signed)
Patient c/o Palpitations:  High priority if patient c/o lightheadedness, shortness of breath, or chest pain  How long have you had palpitations/irregular HR/ Afib? Are you having the symptoms now? Started getting course yesterday into the night, is not having palpitations now   Are you currently experiencing lightheadedness, SOB or CP? No   Do you have a history of afib (atrial fibrillation) or irregular heart rhythm? Yes   Have you checked your BP or HR? (document readings if available):  Not yet, HR got up to 125 yesterday   Are you experiencing any other symptoms? SOB, can tell when device is reacting sometimes.    Is sending in a transmission as well.

## 2021-04-13 NOTE — Telephone Encounter (Signed)
Returned call to patient who states that he was calling to let Dr. Claiborne Billings know that he will be being admitted to the hospital on 11/2 for him to start the heparin bridge for his lung biopsy procedure. Patient will go in for admission on 11/2 and have his procedure on 11/8.  Patient would like to know what he should do about his appointment with Dr. Claiborne Billings on 11/3. Patient would also like to know if Cardiology would see him in the hospital. Advised patient that I would forward message to Dr. Claiborne Billings. Cancelled patients appointment for 11/3. Made patient a hospital follow up for 11/17 with Sande Rives PA. Advised patient I would forward to Dr. Claiborne Billings to make him aware. Patient verbalized understanding.

## 2021-04-13 NOTE — Telephone Encounter (Signed)
Pt has an appt with Dr. Claiborne Billings on 04/21/21 but he is being admitted into the hospital on 04/20/21, pt wants to know if it's ok for him to cancel the appt on 04/21/21 and ask Dr. Claiborne Billings or APP to round on pt while he is hospitalized. Pt is having a biopsy on his lungs. Please advise pt further

## 2021-04-14 ENCOUNTER — Other Ambulatory Visit: Payer: Self-pay

## 2021-04-14 ENCOUNTER — Ambulatory Visit (HOSPITAL_COMMUNITY)
Admission: RE | Admit: 2021-04-14 | Discharge: 2021-04-14 | Disposition: A | Discharge status: Home / Self Care | Payer: Medicare Other | Source: Ambulatory Visit | Attending: Pulmonary Disease | Admitting: Pulmonary Disease

## 2021-04-14 ENCOUNTER — Ambulatory Visit (INDEPENDENT_AMBULATORY_CARE_PROVIDER_SITE_OTHER): Payer: Medicare Other | Admitting: *Deleted

## 2021-04-14 ENCOUNTER — Encounter (HOSPITAL_COMMUNITY): Payer: Self-pay

## 2021-04-14 DIAGNOSIS — I700 Atherosclerosis of aorta: Secondary | ICD-10-CM | POA: Diagnosis not present

## 2021-04-14 DIAGNOSIS — Z23 Encounter for immunization: Secondary | ICD-10-CM

## 2021-04-14 DIAGNOSIS — R911 Solitary pulmonary nodule: Secondary | ICD-10-CM

## 2021-04-14 LAB — GLUCOSE, CAPILLARY: Glucose-Capillary: 95 mg/dL (ref 70–99)

## 2021-04-14 MED ORDER — FLUDEOXYGLUCOSE F - 18 (FDG) INJECTION
10.7300 | Freq: Once | INTRAVENOUS | Status: AC
  Administered 2021-04-14: 10.73 via INTRAVENOUS

## 2021-04-17 ENCOUNTER — Other Ambulatory Visit: Payer: Self-pay

## 2021-04-17 ENCOUNTER — Encounter (HOSPITAL_COMMUNITY): Payer: Self-pay | Admitting: Internal Medicine

## 2021-04-17 ENCOUNTER — Emergency Department (HOSPITAL_COMMUNITY): Payer: Medicare Other

## 2021-04-17 ENCOUNTER — Inpatient Hospital Stay (HOSPITAL_COMMUNITY)
Admission: EM | Admit: 2021-04-17 | Discharge: 2021-04-29 | DRG: 264 | Disposition: A | Discharge status: Home / Self Care | Payer: Medicare Other | Attending: Family Medicine | Admitting: Family Medicine

## 2021-04-17 ENCOUNTER — Telehealth: Payer: Self-pay | Admitting: Medical

## 2021-04-17 ENCOUNTER — Encounter (HOSPITAL_COMMUNITY)
Admission: EM | Disposition: A | Discharge status: Home / Self Care | Payer: Self-pay | Source: Home / Self Care | Attending: Internal Medicine

## 2021-04-17 DIAGNOSIS — Z20822 Contact with and (suspected) exposure to covid-19: Secondary | ICD-10-CM | POA: Diagnosis present

## 2021-04-17 DIAGNOSIS — Z7902 Long term (current) use of antithrombotics/antiplatelets: Secondary | ICD-10-CM | POA: Diagnosis not present

## 2021-04-17 DIAGNOSIS — I252 Old myocardial infarction: Secondary | ICD-10-CM

## 2021-04-17 DIAGNOSIS — Z87891 Personal history of nicotine dependence: Secondary | ICD-10-CM

## 2021-04-17 DIAGNOSIS — L03114 Cellulitis of left upper limb: Secondary | ICD-10-CM | POA: Diagnosis present

## 2021-04-17 DIAGNOSIS — I110 Hypertensive heart disease with heart failure: Secondary | ICD-10-CM | POA: Diagnosis not present

## 2021-04-17 DIAGNOSIS — R0789 Other chest pain: Secondary | ICD-10-CM | POA: Diagnosis not present

## 2021-04-17 DIAGNOSIS — Z7984 Long term (current) use of oral hypoglycemic drugs: Secondary | ICD-10-CM | POA: Diagnosis not present

## 2021-04-17 DIAGNOSIS — I499 Cardiac arrhythmia, unspecified: Secondary | ICD-10-CM | POA: Diagnosis not present

## 2021-04-17 DIAGNOSIS — E785 Hyperlipidemia, unspecified: Secondary | ICD-10-CM | POA: Diagnosis not present

## 2021-04-17 DIAGNOSIS — Z95810 Presence of automatic (implantable) cardiac defibrillator: Secondary | ICD-10-CM | POA: Diagnosis not present

## 2021-04-17 DIAGNOSIS — I82A12 Acute embolism and thrombosis of left axillary vein: Secondary | ICD-10-CM | POA: Diagnosis present

## 2021-04-17 DIAGNOSIS — Z7901 Long term (current) use of anticoagulants: Secondary | ICD-10-CM

## 2021-04-17 DIAGNOSIS — R911 Solitary pulmonary nodule: Secondary | ICD-10-CM | POA: Diagnosis not present

## 2021-04-17 DIAGNOSIS — E119 Type 2 diabetes mellitus without complications: Secondary | ICD-10-CM | POA: Diagnosis present

## 2021-04-17 DIAGNOSIS — Z79899 Other long term (current) drug therapy: Secondary | ICD-10-CM | POA: Diagnosis not present

## 2021-04-17 DIAGNOSIS — Z9889 Other specified postprocedural states: Secondary | ICD-10-CM

## 2021-04-17 DIAGNOSIS — Z888 Allergy status to other drugs, medicaments and biological substances status: Secondary | ICD-10-CM | POA: Diagnosis not present

## 2021-04-17 DIAGNOSIS — I2510 Atherosclerotic heart disease of native coronary artery without angina pectoris: Secondary | ICD-10-CM | POA: Diagnosis present

## 2021-04-17 DIAGNOSIS — Z8673 Personal history of transient ischemic attack (TIA), and cerebral infarction without residual deficits: Secondary | ICD-10-CM

## 2021-04-17 DIAGNOSIS — C3412 Malignant neoplasm of upper lobe, left bronchus or lung: Secondary | ICD-10-CM | POA: Diagnosis present

## 2021-04-17 DIAGNOSIS — Z955 Presence of coronary angioplasty implant and graft: Secondary | ICD-10-CM | POA: Diagnosis not present

## 2021-04-17 DIAGNOSIS — L538 Other specified erythematous conditions: Secondary | ICD-10-CM | POA: Diagnosis not present

## 2021-04-17 DIAGNOSIS — I428 Other cardiomyopathies: Secondary | ICD-10-CM | POA: Diagnosis not present

## 2021-04-17 DIAGNOSIS — Z4502 Encounter for adjustment and management of automatic implantable cardiac defibrillator: Secondary | ICD-10-CM

## 2021-04-17 DIAGNOSIS — K219 Gastro-esophageal reflux disease without esophagitis: Secondary | ICD-10-CM | POA: Diagnosis present

## 2021-04-17 DIAGNOSIS — I472 Ventricular tachycardia, unspecified: Principal | ICD-10-CM | POA: Diagnosis present

## 2021-04-17 DIAGNOSIS — R0689 Other abnormalities of breathing: Secondary | ICD-10-CM | POA: Diagnosis not present

## 2021-04-17 DIAGNOSIS — J449 Chronic obstructive pulmonary disease, unspecified: Secondary | ICD-10-CM | POA: Diagnosis not present

## 2021-04-17 DIAGNOSIS — Z7951 Long term (current) use of inhaled steroids: Secondary | ICD-10-CM

## 2021-04-17 DIAGNOSIS — Z515 Encounter for palliative care: Secondary | ICD-10-CM | POA: Diagnosis not present

## 2021-04-17 DIAGNOSIS — G4730 Sleep apnea, unspecified: Secondary | ICD-10-CM | POA: Diagnosis present

## 2021-04-17 DIAGNOSIS — R918 Other nonspecific abnormal finding of lung field: Secondary | ICD-10-CM | POA: Diagnosis not present

## 2021-04-17 DIAGNOSIS — Z7189 Other specified counseling: Secondary | ICD-10-CM | POA: Diagnosis not present

## 2021-04-17 DIAGNOSIS — I517 Cardiomegaly: Secondary | ICD-10-CM | POA: Diagnosis not present

## 2021-04-17 DIAGNOSIS — M7989 Other specified soft tissue disorders: Secondary | ICD-10-CM | POA: Diagnosis not present

## 2021-04-17 DIAGNOSIS — J849 Interstitial pulmonary disease, unspecified: Secondary | ICD-10-CM | POA: Diagnosis not present

## 2021-04-17 DIAGNOSIS — I4729 Other ventricular tachycardia: Secondary | ICD-10-CM | POA: Diagnosis not present

## 2021-04-17 DIAGNOSIS — K5900 Constipation, unspecified: Secondary | ICD-10-CM | POA: Diagnosis present

## 2021-04-17 DIAGNOSIS — Z91013 Allergy to seafood: Secondary | ICD-10-CM | POA: Diagnosis not present

## 2021-04-17 DIAGNOSIS — I5022 Chronic systolic (congestive) heart failure: Secondary | ICD-10-CM | POA: Diagnosis present

## 2021-04-17 DIAGNOSIS — K649 Unspecified hemorrhoids: Secondary | ICD-10-CM | POA: Diagnosis present

## 2021-04-17 DIAGNOSIS — Z794 Long term (current) use of insulin: Secondary | ICD-10-CM | POA: Diagnosis not present

## 2021-04-17 DIAGNOSIS — I255 Ischemic cardiomyopathy: Secondary | ICD-10-CM | POA: Diagnosis present

## 2021-04-17 DIAGNOSIS — Z89422 Acquired absence of other left toe(s): Secondary | ICD-10-CM

## 2021-04-17 DIAGNOSIS — M79602 Pain in left arm: Secondary | ICD-10-CM | POA: Diagnosis not present

## 2021-04-17 DIAGNOSIS — R079 Chest pain, unspecified: Secondary | ICD-10-CM | POA: Diagnosis not present

## 2021-04-17 DIAGNOSIS — J418 Mixed simple and mucopurulent chronic bronchitis: Secondary | ICD-10-CM

## 2021-04-17 DIAGNOSIS — Z8249 Family history of ischemic heart disease and other diseases of the circulatory system: Secondary | ICD-10-CM

## 2021-04-17 DIAGNOSIS — I4892 Unspecified atrial flutter: Secondary | ICD-10-CM | POA: Diagnosis present

## 2021-04-17 DIAGNOSIS — R0902 Hypoxemia: Secondary | ICD-10-CM | POA: Diagnosis not present

## 2021-04-17 DIAGNOSIS — Z419 Encounter for procedure for purposes other than remedying health state, unspecified: Secondary | ICD-10-CM

## 2021-04-17 DIAGNOSIS — I213 ST elevation (STEMI) myocardial infarction of unspecified site: Secondary | ICD-10-CM | POA: Diagnosis not present

## 2021-04-17 LAB — LIPID PANEL
Cholesterol: 125 mg/dL (ref 0–200)
HDL: 33 mg/dL — ABNORMAL LOW (ref >40)
LDL Cholesterol: 79 mg/dL (ref 0–99)
Total CHOL/HDL Ratio: 3.8 RATIO
Triglycerides: 64 mg/dL (ref <150)
VLDL: 13 mg/dL (ref 0–40)

## 2021-04-17 LAB — CBC WITH DIFFERENTIAL/PLATELET
Abs Immature Granulocytes: 0.08 10*3/uL — ABNORMAL HIGH (ref 0.00–0.07)
Basophils Absolute: 0 10*3/uL (ref 0.0–0.1)
Basophils Relative: 1 %
Eosinophils Absolute: 0 10*3/uL (ref 0.0–0.5)
Eosinophils Relative: 1 %
HCT: 55.7 % — ABNORMAL HIGH (ref 39.0–52.0)
Hemoglobin: 17.7 g/dL — ABNORMAL HIGH (ref 13.0–17.0)
Immature Granulocytes: 3 %
Lymphocytes Relative: 42 %
Lymphs Abs: 1 10*3/uL (ref 0.7–4.0)
MCH: 30.1 pg (ref 26.0–34.0)
MCHC: 31.8 g/dL (ref 30.0–36.0)
MCV: 94.7 fL (ref 80.0–100.0)
Monocytes Absolute: 0.1 10*3/uL (ref 0.1–1.0)
Monocytes Relative: 3 %
Neutro Abs: 1.2 10*3/uL — ABNORMAL LOW (ref 1.7–7.7)
Neutrophils Relative %: 50 %
Platelets: 141 10*3/uL — ABNORMAL LOW (ref 150–400)
RBC: 5.88 MIL/uL — ABNORMAL HIGH (ref 4.22–5.81)
RDW: 13.6 % (ref 11.5–15.5)
WBC: 2.4 10*3/uL — ABNORMAL LOW (ref 4.0–10.5)
nRBC: 0 % (ref 0.0–0.2)

## 2021-04-17 LAB — T4, FREE: Free T4: 1.19 ng/dL — ABNORMAL HIGH (ref 0.61–1.12)

## 2021-04-17 LAB — HEMOGLOBIN A1C
Hgb A1c MFr Bld: 6.8 % — ABNORMAL HIGH (ref 4.8–5.6)
Mean Plasma Glucose: 148.46 mg/dL

## 2021-04-17 LAB — COMPREHENSIVE METABOLIC PANEL
ALT: 18 U/L (ref 0–44)
AST: 23 U/L (ref 15–41)
Albumin: 3.5 g/dL (ref 3.5–5.0)
Alkaline Phosphatase: 71 U/L (ref 38–126)
Anion gap: 9 (ref 5–15)
BUN: 15 mg/dL (ref 8–23)
CO2: 23 mmol/L (ref 22–32)
Calcium: 8.6 mg/dL — ABNORMAL LOW (ref 8.9–10.3)
Chloride: 109 mmol/L (ref 98–111)
Creatinine, Ser: 1.01 mg/dL (ref 0.61–1.24)
GFR, Estimated: >60 mL/min (ref >60)
Glucose, Bld: 124 mg/dL — ABNORMAL HIGH (ref 70–99)
Potassium: 3.7 mmol/L (ref 3.5–5.1)
Sodium: 141 mmol/L (ref 135–145)
Total Bilirubin: 1.1 mg/dL (ref 0.3–1.2)
Total Protein: 5.8 g/dL — ABNORMAL LOW (ref 6.5–8.1)

## 2021-04-17 LAB — PROTIME-INR
INR: 1.1 (ref 0.8–1.2)
Prothrombin Time: 14.6 seconds (ref 11.4–15.2)

## 2021-04-17 LAB — MAGNESIUM: Magnesium: 2 mg/dL (ref 1.7–2.4)

## 2021-04-17 LAB — GLUCOSE, CAPILLARY
Glucose-Capillary: 103 mg/dL — ABNORMAL HIGH (ref 70–99)
Glucose-Capillary: 136 mg/dL — ABNORMAL HIGH (ref 70–99)

## 2021-04-17 LAB — MRSA NEXT GEN BY PCR, NASAL: MRSA by PCR Next Gen: NOT DETECTED

## 2021-04-17 LAB — TROPONIN I (HIGH SENSITIVITY)
Troponin I (High Sensitivity): 25 ng/L — ABNORMAL HIGH (ref <18)
Troponin I (High Sensitivity): 22 ng/L — ABNORMAL HIGH (ref <18)

## 2021-04-17 LAB — APTT: aPTT: 30 seconds (ref 24–36)

## 2021-04-17 LAB — RESP PANEL BY RT-PCR (FLU A&B, COVID) ARPGX2
Influenza A by PCR: NEGATIVE
Influenza B by PCR: NEGATIVE
SARS Coronavirus 2 by RT PCR: NEGATIVE

## 2021-04-17 LAB — TSH: TSH: 0.651 u[IU]/mL (ref 0.350–4.500)

## 2021-04-17 SURGERY — CORONARY/GRAFT ACUTE MI REVASCULARIZATION
Anesthesia: LOCAL

## 2021-04-17 MED ORDER — ROSUVASTATIN CALCIUM 20 MG PO TABS
40.0000 mg | ORAL_TABLET | Freq: Every day | ORAL | Status: DC
  Administered 2021-04-17 – 2021-04-28 (×12): 40 mg via ORAL
  Filled 2021-04-17 (×12): qty 2

## 2021-04-17 MED ORDER — AMIODARONE HCL IN DEXTROSE 360-4.14 MG/200ML-% IV SOLN
60.0000 mg/h | INTRAVENOUS | Status: AC
  Administered 2021-04-18 (×2): 30 mg/h via INTRAVENOUS
  Administered 2021-04-19 (×2): 60 mg/h via INTRAVENOUS
  Administered 2021-04-19: 30 mg/h via INTRAVENOUS
  Administered 2021-04-20 (×2): 60 mg/h via INTRAVENOUS
  Filled 2021-04-17 (×10): qty 200

## 2021-04-17 MED ORDER — NITROGLYCERIN 0.4 MG SL SUBL
0.4000 mg | SUBLINGUAL_TABLET | SUBLINGUAL | Status: DC | PRN
  Administered 2021-04-19: 0.4 mg via SUBLINGUAL
  Filled 2021-04-17: qty 1

## 2021-04-17 MED ORDER — ACETAMINOPHEN 325 MG PO TABS
650.0000 mg | ORAL_TABLET | ORAL | Status: DC | PRN
Start: 2021-04-17 — End: 2021-04-29
  Administered 2021-04-19 – 2021-04-29 (×6): 650 mg via ORAL
  Filled 2021-04-17 (×6): qty 2

## 2021-04-17 MED ORDER — LIDOCAINE HCL (CARDIAC) PF 50 MG/5ML IV SOSY
0.5000 mg/kg | PREFILLED_SYRINGE | Freq: Once | INTRAVENOUS | Status: DC
  Filled 2021-04-17: qty 4.88

## 2021-04-17 MED ORDER — EMPAGLIFLOZIN 25 MG PO TABS
25.0000 mg | ORAL_TABLET | Freq: Every day | ORAL | Status: DC
  Administered 2021-04-17 – 2021-04-29 (×13): 25 mg via ORAL
  Filled 2021-04-17 (×13): qty 1

## 2021-04-17 MED ORDER — APIXABAN 5 MG PO TABS
5.0000 mg | ORAL_TABLET | Freq: Two times a day (BID) | ORAL | Status: DC
  Administered 2021-04-17 – 2021-04-19 (×4): 5 mg via ORAL
  Filled 2021-04-17 (×4): qty 1

## 2021-04-17 MED ORDER — ALBUTEROL SULFATE (2.5 MG/3ML) 0.083% IN NEBU: 2.5000 mg | INHALATION_SOLUTION | Freq: Four times a day (QID) | RESPIRATORY_TRACT | Status: DC | PRN

## 2021-04-17 MED ORDER — CLOPIDOGREL BISULFATE 75 MG PO TABS
75.0000 mg | ORAL_TABLET | Freq: Every day | ORAL | Status: DC
  Administered 2021-04-18 – 2021-04-19 (×2): 75 mg via ORAL
  Filled 2021-04-17 (×2): qty 1

## 2021-04-17 MED ORDER — LIDOCAINE IN D5W 4-5 MG/ML-% IV SOLN
2.0000 mg/min | INTRAVENOUS | Status: DC
  Administered 2021-04-17: 1 mg/min via INTRAVENOUS
  Administered 2021-04-19 (×2): 2 mg/min via INTRAVENOUS
  Filled 2021-04-17 (×3): qty 500

## 2021-04-17 MED ORDER — INSULIN ASPART 100 UNIT/ML IJ SOLN
0.0000 [IU] | Freq: Every day | INTRAMUSCULAR | Status: DC
  Administered 2021-04-26: 3 [IU] via SUBCUTANEOUS

## 2021-04-17 MED ORDER — SODIUM CHLORIDE 0.9 % IV SOLN: INTRAVENOUS | Status: DC

## 2021-04-17 MED ORDER — SACUBITRIL-VALSARTAN 24-26 MG PO TABS
1.0000 | ORAL_TABLET | Freq: Two times a day (BID) | ORAL | Status: DC
  Administered 2021-04-17 – 2021-04-29 (×24): 1 via ORAL
  Filled 2021-04-17 (×25): qty 1

## 2021-04-17 MED ORDER — INSULIN DETEMIR 100 UNIT/ML ~~LOC~~ SOLN
46.0000 [IU] | Freq: Every day | SUBCUTANEOUS | Status: DC
  Administered 2021-04-17 – 2021-04-29 (×13): 46 [IU] via SUBCUTANEOUS
  Filled 2021-04-17 (×13): qty 0.46

## 2021-04-17 MED ORDER — AMIODARONE HCL IN DEXTROSE 360-4.14 MG/200ML-% IV SOLN
60.0000 mg/h | INTRAVENOUS | Status: AC
  Administered 2021-04-17 (×2): 60 mg/h via INTRAVENOUS
  Filled 2021-04-17: qty 200

## 2021-04-17 MED ORDER — MONTELUKAST SODIUM 10 MG PO TABS
10.0000 mg | ORAL_TABLET | Freq: Every day | ORAL | Status: DC
  Administered 2021-04-18 – 2021-04-29 (×12): 10 mg via ORAL
  Filled 2021-04-17 (×12): qty 1

## 2021-04-17 MED ORDER — MEXILETINE HCL 150 MG PO CAPS
300.0000 mg | ORAL_CAPSULE | Freq: Two times a day (BID) | ORAL | Status: DC
  Filled 2021-04-17: qty 2

## 2021-04-17 MED ORDER — ONDANSETRON HCL 4 MG/2ML IJ SOLN: 4.0000 mg | Freq: Four times a day (QID) | INTRAMUSCULAR | Status: DC | PRN

## 2021-04-17 MED ORDER — LIDOCAINE BOLUS VIA INFUSION
50.0000 mg | Freq: Once | INTRAVENOUS | Status: AC
  Administered 2021-04-17: 50 mg via INTRAVENOUS
  Filled 2021-04-17: qty 52

## 2021-04-17 MED ORDER — BUDESONIDE 0.5 MG/2ML IN SUSP
2.0000 mL | Freq: Two times a day (BID) | RESPIRATORY_TRACT | Status: DC
  Administered 2021-04-17 – 2021-04-29 (×24): 0.5 mg via RESPIRATORY_TRACT
  Filled 2021-04-17 (×24): qty 2

## 2021-04-17 MED ORDER — INSULIN ASPART 100 UNIT/ML IJ SOLN
0.0000 [IU] | Freq: Three times a day (TID) | INTRAMUSCULAR | Status: DC
  Administered 2021-04-18: 2 [IU] via SUBCUTANEOUS
  Administered 2021-04-19 – 2021-04-20 (×4): 1 [IU] via SUBCUTANEOUS
  Administered 2021-04-20: 2 [IU] via SUBCUTANEOUS
  Administered 2021-04-21: 3 [IU] via SUBCUTANEOUS
  Administered 2021-04-21: 2 [IU] via SUBCUTANEOUS
  Administered 2021-04-22: 3 [IU] via SUBCUTANEOUS
  Administered 2021-04-23: 1 [IU] via SUBCUTANEOUS
  Administered 2021-04-23: 3 [IU] via SUBCUTANEOUS
  Administered 2021-04-24 (×2): 2 [IU] via SUBCUTANEOUS
  Administered 2021-04-24: 1 [IU] via SUBCUTANEOUS
  Administered 2021-04-25 – 2021-04-27 (×3): 2 [IU] via SUBCUTANEOUS
  Administered 2021-04-27 (×2): 3 [IU] via SUBCUTANEOUS
  Administered 2021-04-28 – 2021-04-29 (×4): 1 [IU] via SUBCUTANEOUS

## 2021-04-17 NOTE — H&P (Signed)
HPI John [name] returns today for additional evaluation and treatment of VT. the patient is a very pleasant 70 year old man with coronary artery disease, nonobstructive, moderate left ventricular dysfunction, with a history of sustained monomorphic ventricular tachycardia.  He underwent ICD insertion approximately 8 months ago.  Because of recurrent ventricular tachycardia, he underwent ventricular tachycardia ablation back in August.  At that time to distinct ventricular tachycardias were noted.  One was a posterolateral VT that was successfully ablated near the papillary muscle.  A second VT was not targeted for ablation but was described as a left superior axis VT.  The patient was in his usual state of health until earlier this morning when he felt chest pressure and received multiple ICD shocks.  On presentation to the emergency department, he was in sustained monomorphic ventricular tachycardia below the detection zone and was cardioverted.  On my presence the patient has had recurrent ventricular tachycardia for which she has been successfully paced out of with antitachycardia pacing.  He denies any medical or dietary noncompliance.  The patient has been treated with intravenous amiodarone.  He denies anginal symptoms.  An EKG after cardioversion demonstrates no acute changes. Allergies  Allergen Reactions   Omega-3 Fatty Acids Hives and Itching   Benazepril Other (See Comments)    hyperkalemia   Fish Allergy Itching     Current Facility-Administered Medications  Medication Dose Route Frequency Provider Last Rate Last Admin   0.9 %  sodium chloride infusion   Intravenous Continuous Kathrynn Humble, Ankit, MD       amiodarone (NEXTERONE PREMIX) 360-4.14 MG/200ML-% (1.8 mg/mL) IV infusion  60 mg/hr Intravenous Continuous Kathrynn Humble, Ankit, MD 33.3 mL/hr at 04/17/21 1313 60 mg/hr at 04/17/21 1313   amiodarone (NEXTERONE PREMIX) 360-4.14 MG/200ML-% (1.8 mg/mL) IV infusion  30 mg/hr Intravenous  Continuous Varney Biles, MD       Current Outpatient Medications  Medication Sig Dispense Refill   albuterol (VENTOLIN HFA) 108 (90 Base) MCG/ACT inhaler Inhale 2 puffs into the lungs every 6 (six) hours as needed for wheezing or shortness of breath. 1 each 6   clopidogrel (PLAVIX) 75 MG tablet Take 1 tablet (75 mg total) by mouth daily with breakfast.     ELIQUIS 5 MG TABS tablet TAKE 1 TABLET BY MOUTH TWICE A DAY 180 tablet 1   ENTRESTO 24-26 MG TAKE 1 TABLET BY MOUTH TWICE A DAY (Patient taking differently: Take 1 tablet by mouth 2 (two) times daily.) 60 tablet 6   Fluticasone Furoate (ARNUITY ELLIPTA) 200 MCG/ACT AEPB Inhale 1 puff into the lungs daily. 30 each 6   glucose blood (ONETOUCH ULTRA) test strip USE TO CHECK BLOOD SUGAR 3 TIMES A DAY (E11.9) 100 strip 2   insulin aspart (NOVOLOG FLEXPEN) 100 UNIT/ML FlexPen INJECT 5-20 UNITS SUBCUTANEOUSLY WITH LUNCH AND DINNER (Patient taking differently: Inject 6-8 Units into the skin daily as needed for high blood sugar.) 15 mL 1   Insulin Pen Needle (NOVOFINE) 32G X 6 MM MISC 1 each by Other route daily. 100 each 3   JARDIANCE 25 MG TABS tablet TAKE 1 TABLET BY MOUTH EVERY DAY (Patient taking differently: Take 25 mg by mouth daily.) 30 tablet 5   LEVEMIR FLEXTOUCH 100 UNIT/ML FlexPen INJECT 46 UNITS INTO THE SKIN DAILY. DX:E11.9 (Patient taking differently: Inject 46 Units into the skin daily.) 15 mL 3   mexiletine (MEXITIL) 150 MG capsule Take 2 capsules (300 mg total) by mouth every 12 (twelve) hours. 120 capsule 5  montelukast (SINGULAIR) 10 MG tablet TAKE 1 TABLET BY MOUTH EVERY DAY 90 tablet 3   nitroGLYCERIN (NITROSTAT) 0.4 MG SL tablet PLACE 1 TABLET (0.4 MG TOTAL) UNDER THE TONGUE EVERY 5 (FIVE) MINUTES AS NEEDED FOR CHEST PAIN. 25 tablet 1   rosuvastatin (CRESTOR) 40 MG tablet Take 1 tablet (40 mg total) by mouth daily.     sotalol (BETAPACE) 160 MG tablet Take 1 tablet (160 mg total) by mouth 2 (two) times daily. 60 tablet 6      Past Medical History:  Diagnosis Date   Atrial flutter ([place])    s/p cardioversion   Coronary artery disease    Diabetes mellitus    GERD (gastroesophageal reflux disease)    History of nuclear stress test 04/04/2011   lexiscan; mod-large in size fixed inferolateral defect (scar); non-diagnostic for ischemia; low risk scan    Hyperlipidemia    Hypertension    Left foot drop    r/t past disk srugery - uses Kevlar brace   Myocardial infarction [facility])    posterior MI   Shortness of breath    Sleep apnea    on CPAP; 04/28/2007 split-night - AHI during total sleep 44.43/hr and REM 72.56/hr    ROS:   All systems reviewed and negative except as noted in the HPI.   Past Surgical History:  Procedure Laterality Date   AMPUTATION TOE Left 02/10/2021   Procedure: AMPUTATION  LEFT SECOND TOE;  Surgeon: Edrick Kins, DPM;  Location: [place] [facility];  Service: Podiatry;  Laterality: Left;   [place]  2010   6 stents total   CARDIAC CATHETERIZATION  01/2000   percutaneous transluminal coronary balloon angioplasty of mid RCA stenotic lesion   CARDIAC CATHETERIZATION  06/2006   no stenting; ischemic cardiomyopathy, EF 40-45%   CARDIOVERSION N/A 07/28/2016   Procedure: CARDIOVERSION;  Surgeon: Troy Sine, MD;  Location: [place];  Service: Cardiovascular;  Laterality: N/A;   CORONARY ANGIOPLASTY  09/1998   mid-distal RCA balloon dilatation, 4.5 & 5.0 stents    CORONARY ANGIOPLASTY WITH STENT PLACEMENT  03/1994   angioplasty & stenting (non-DES) of circumflex/prox ramus intermedius   CORONARY ANGIOPLASTY WITH STENT PLACEMENT  10/1994   large iliac [id number] stent to RCA   [place]  12/2002   4.37mm stents x2 of RCA   CORONARY ANGIOPLASTY WITH STENT PLACEMENT  01/2005   cutting balloon arthrectomy of distal RCA & Cypher DES 3.5x13; cutting balloon arthrectomy of mid RCA with Cypher DES 3.5x18   CORONARY ANGIOPLASTY WITH STENT  PLACEMENT  11/2008   stenting of mid RCA with 4.0x96mm driver, non-DES   CORONARY BALLOON ANGIOPLASTY N/A 02/15/2021   Procedure: CORONARY BALLOON ANGIOPLASTY;  Surgeon: Troy Sine, MD;  Location: [place] [facility];  Service: Cardiovascular;  Laterality: N/A;   ICD IMPLANT N/A 08/20/2020   Procedure: ICD IMPLANT;  Surgeon: Constance Haw, MD;  Location: [place] [facility];  Service: Cardiovascular;  Laterality: N/A;   INTRAVASCULAR PRESSURE WIRE/FFR STUDY N/A 03/02/2020   Procedure: INTRAVASCULAR PRESSURE WIRE/FFR STUDY;  Surgeon: Leonie Man, MD;  Location: [place] [facility];  Service: Cardiovascular;  Laterality: N/A;   LEFT HEART CATH AND CORONARY ANGIOGRAPHY N/A 03/02/2020   Procedure: LEFT HEART CATH AND CORONARY ANGIOGRAPHY;  Surgeon: Leonie Man, MD;  Location: [place] [facility];  Service: Cardiovascular;  Laterality: N/A;   LEFT HEART CATH AND CORONARY ANGIOGRAPHY N/A 08/19/2020  Procedure: LEFT HEART CATH AND CORONARY ANGIOGRAPHY;  Surgeon: Lorretta Harp, MD;  Location: [place] [facility];  Service: Cardiovascular;  Laterality: N/A;   LEFT HEART CATH AND CORONARY ANGIOGRAPHY N/A 02/15/2021   Procedure: LEFT HEART CATH AND CORONARY ANGIOGRAPHY;  Surgeon: Troy Sine, MD;  Location: [place] [facility];  Service: Cardiovascular;  Laterality: N/A;   LEFT HEART CATHETERIZATION WITH CORONARY ANGIOGRAM N/A 02/27/2012   Procedure: LEFT HEART CATHETERIZATION WITH CORONARY ANGIOGRAM;  Surgeon: Lorretta Harp, MD;  Location: [facility] CATH LAB;  Service: Cardiovascular;  Laterality: N/A;   TRANSTHORACIC ECHOCARDIOGRAM  07/29/2010   EF 50=55%, mod inf wall hypokinesis & mild post wall hypokinesis; LA mild-mod dilated; mild mitral annular calcif & mild MR; mild TR & elevated RV systolic pressure; AV mildly sclerotic; mild aortic root dilatation    V TACH ABLATION N/A 01/20/2021   Procedure: V TACH ABLATION;  Surgeon: Vickie Epley, MD;  Location: [place] [facility];  Service:  Cardiovascular;  Laterality: N/A;     Family History  Problem Relation Age of Onset   Heart attack Father      Social History   Socioeconomic History   Marital status: Married    Spouse name: Not on file   Number of children: 3   Years of education: Not on file   Highest education level: Not on file  Occupational History   Occupation: Best boy: [employer]    Comment: [place], [place]. [place]  Tobacco Use   Smoking status: Former    Packs/day: 1.00    Years: 50.00    Pack years: 50.00    Types: Cigarettes    Quit date: 07/20/2016    Years since quitting: 4.7   Smokeless tobacco: Never  Vaping Use   Vaping Use: Never used  Substance and Sexual Activity   Alcohol use: Not Currently    Alcohol/week: 0.0 standard drinks   Drug use: No   Sexual activity: Yes  Other Topics Concern   Not on file  Social History Narrative   Not on file   Social Determinants of Health   Financial Resource Strain: Not on file  Food Insecurity: Not on file  Transportation Needs: Not on file  Physical Activity: Not on file  Stress: Not on file  Social Connections: Not on file  Intimate Partner Violence: Not on file     BP 137/82   Pulse 69   Temp 97.8 F (36.6 C) (Oral)   Resp (!) 25   Ht 6' (1.829 m)   Wt 97.5 kg   SpO2 98%   BMI 29.16 kg/m   Physical Exam:  Well appearing 70 year old man, NAD HEENT: Unremarkable, except wears a left eye patch Neck:  No JVD, no thyromegally Lymphatics:  No adenopathy Back:  No CVA tenderness Lungs:  Clear, with no wheezes, rales, or rhonchi HEART:  Regular rate rhythm, no murmurs, no rubs, no clicks Abd:  soft, positive bowel sounds, no organomegally, no rebound, no guarding Ext:  2 plus pulses, no edema, no cyanosis, no clubbing Skin:  No rashes no nodules Neuro:  CN II through XII intact, motor grossly intact  EKG -normal sinus rhythm with atrial pacing.  EKG of ventricular tachycardia demonstrates a right bundle left  superior axis VT with essentially positive precordial concordance.  1 and aVL are positive.  DEVICE  Normal device function.  See PaceArt for details.   Assess/Plan:  1.  Recurrent hemodynamically intolerable sustained  monomorphic ventricular tachycardia -he will be treated with intravenous amiodarone.  He has received multiple shocks and he will be treated for ventricular tachycardia storm.  He will be admitted to the ICU.  He will continue mexiletine though we may switch him to IV lidocaine in addition to IV amiodarone.  He is not a great candidate for long-term oral amiodarone therapy with his prior lung disease.  If his ventricular tachycardia does not improve, we will consider repeat catheter ablation. 2.  Chronic systolic heart failure -his ejection fraction is 40 to 45% by echo back in late August. 3.  Coronary artery disease -most recent catheterization in early March demonstrated no obstructive disease.  Cristopher Peru, MD

## 2021-04-17 NOTE — ED Triage Notes (Addendum)
Pt from home via [facility] for eval of chest fluttering x 2 hours. Has ICD, which did shock him one time. Code Stemi activated by EMS and in route patient went into vtach with a pulse, for which EMS gave 150 mg amiodarone without improvement. Denies chest pain on arrival to ED. Pt took one nitro prior to EMS arrival and was given 324 ASA.

## 2021-04-17 NOTE — Plan of Care (Signed)

## 2021-04-17 NOTE — Progress Notes (Signed)
STEMI activation  Patient is a 70 year old male with a history of coronary artery disease status post multiple PCI's with most recent of the distal right coronary artery in August 0488 complicated by stroke, VT status post ablation and ICD, ejection fraction 45%, hypertension, and hyperlipidemia who awoke this morning in his normal state of health.  He had an ICD shock at home and was then short of breath and had palpitations.  An EKG done on route looks very similar to an EKG done on February 16, 2021.  On arrival here the patient was again in ventricular tachycardia and his ICD treated this successfully.  Repeat EKG demonstrates essentially unchanged findings.  The patient is chest pain-free.  We will defer emergent cardiac catheterization at this point in time.  I discussed this with the emergency room attending and I suggested that he continue amiodarone infusion.    -Lenna Sciara, MD Pager [phone]

## 2021-04-17 NOTE — ED Provider Notes (Signed)
[facility] EMERGENCY DEPARTMENT Provider Note   CSN: [id number] Arrival date & time: 04/17/21  1248     History Chief Complaint  Patient presents with   Code STEMI    John [name] is a 70 y.o. male.  HPI    70 year old male comes in with chief complaint of chest pain. Code STEMI was activated on the field.  Patient has known history of CAD, diabetes, A. fib/flutter and V. fib status post defibrillator placement.  He reports that earlier today he had an episode of sudden onset severe chest pain that radiated towards his back.  It was a singular episode.  Subsequently, every time he gets up and starts experiencing palpitations.  Patient denies any nausea, vomiting, fevers, chills.  He did have some dizziness.  Past Medical History:  Diagnosis Date   Atrial flutter ([place])    s/p cardioversion   Coronary artery disease    Diabetes mellitus    GERD (gastroesophageal reflux disease)    History of nuclear stress test 04/04/2011   lexiscan; mod-large in size fixed inferolateral defect (scar); non-diagnostic for ischemia; low risk scan    Hyperlipidemia    Hypertension    Left foot drop    r/t past disk srugery - uses Kevlar brace   Myocardial infarction (HCC)    posterior MI   Shortness of breath    Sleep apnea    on CPAP; 04/28/2007 split-night - AHI during total sleep 44.43/hr and REM 72.56/hr    Patient Active Problem List   Diagnosis Date Noted   Embolic stroke ([place]) 85/88/5027   ACS (acute coronary syndrome) ([place])    Left-sided neglect    Acute focal neurological deficit    NSTEMI (non-ST elevated myocardial infarction) ([place]) 02/14/2021   Diabetic infection of left foot ([place]) 74/05/8785   Chronic systolic CHF (congestive heart failure) ([place]) 02/08/2021   History of ventricular tachycardia 02/08/2021   VT (ventricular tachycardia) 01/26/2021   Coagulation defect ([place]) 12/22/2020   Non-ST elevation (NSTEMI) myocardial infarction [facility])     Ventricular tachycardia 08/18/2020   Capsulitis 05/21/2020   Elevated troponin    Hyperglycemia due to diabetes mellitus ([place]) 02/29/2020   Thoracic aortic aneurysm 02/29/2020   Hypotensive episode 02/29/2020   Obesity (BMI 30.0-34.9) 02/29/2020   Lung nodule 02/19/2020   Former smoker 02/19/2020   Healthcare maintenance 02/19/2020   Acoustic neuroma ([place]) 09/11/2017   Asymmetrical sensorineural hearing loss 09/11/2017   Encounter for cardioversion procedure    Anticoagulation adequate 06/06/2016   Fatigue 10/27/2015   Bradycardia 10/27/2015   Hyperlipidemia LDL goal <70 05/20/2015   OSA on CPAP 07/23/2013   Excessive daytime sleepiness 07/23/2013   Hypertension    Hyperlipidemia    Chest pain 08/18/2012   Tobacco abuse 08/18/2012   Unstable angina, cath [facility] 02/27/12 02/26/2012   CAD, multiple prior RCA PCI's. Last cath 2010, Myoview low risk Oct 2012 02/26/2012   DM type 2, goal HbA1c < 7% (HCC) 02/26/2012   HTN (hypertension) 02/26/2012   Dyslipidemia, low HDL. Intol to fish oil and Noacin 02/26/2012   Sleep apnea, on C-pap 02/26/2012   COPD (chronic obstructive pulmonary disease) ([place]) 02/26/2012   Smoking, quit one week ago 02/26/2012   GERD (gastroesophageal reflux disease) 02/26/2012    Past Surgical History:  Procedure Laterality Date   AMPUTATION TOE Left 02/10/2021   Procedure: AMPUTATION  LEFT SECOND TOE;  Surgeon: Edrick Kins, DPM;  Location: [place];  Service: Podiatry;  Laterality: Left;  [place] CATHETERIZATION  2010   6 stents total   CARDIAC CATHETERIZATION  01/2000   percutaneous transluminal coronary balloon angioplasty of mid RCA stenotic lesion   CARDIAC CATHETERIZATION  06/2006   no stenting; ischemic cardiomyopathy, EF 40-45%   CARDIOVERSION N/A 07/28/2016   Procedure: CARDIOVERSION;  Surgeon: Troy Sine, MD;  Location: [place];  Service: Cardiovascular;  Laterality: N/A;   CORONARY ANGIOPLASTY  09/1998   mid-distal RCA  balloon dilatation, 4.5 & 5.0 stents    CORONARY ANGIOPLASTY WITH STENT PLACEMENT  03/1994   angioplasty & stenting (non-DES) of circumflex/prox ramus intermedius   CORONARY ANGIOPLASTY WITH STENT PLACEMENT  10/1994   large iliac [id number] stent to RCA   [place]  12/2002   4.23mm stents x2 of RCA   CORONARY ANGIOPLASTY WITH STENT PLACEMENT  01/2005   cutting balloon arthrectomy of distal RCA & Cypher DES 3.5x13; cutting balloon arthrectomy of mid RCA with Cypher DES 3.5x18   CORONARY ANGIOPLASTY WITH STENT PLACEMENT  11/2008   stenting of mid RCA with 4.0x69mm driver, non-DES   CORONARY BALLOON ANGIOPLASTY N/A 02/15/2021   Procedure: CORONARY BALLOON ANGIOPLASTY;  Surgeon: Troy Sine, MD;  Location: [place] CV LAB;  Service: Cardiovascular;  Laterality: N/A;   ICD IMPLANT N/A 08/20/2020   Procedure: ICD IMPLANT;  Surgeon: Constance Haw, MD;  Location: [place] CV LAB;  Service: Cardiovascular;  Laterality: N/A;   INTRAVASCULAR PRESSURE WIRE/FFR STUDY N/A 03/02/2020   Procedure: INTRAVASCULAR PRESSURE WIRE/FFR STUDY;  Surgeon: Leonie Man, MD;  Location: [place] [facility];  Service: Cardiovascular;  Laterality: N/A;   LEFT HEART CATH AND CORONARY ANGIOGRAPHY N/A 03/02/2020   Procedure: LEFT HEART CATH AND CORONARY ANGIOGRAPHY;  Surgeon: Leonie Man, MD;  Location: [place] CV LAB;  Service: Cardiovascular;  Laterality: N/A;   LEFT HEART CATH AND CORONARY ANGIOGRAPHY N/A 08/19/2020   Procedure: LEFT HEART CATH AND CORONARY ANGIOGRAPHY;  Surgeon: Lorretta Harp, MD;  Location: [place] CV LAB;  Service: Cardiovascular;  Laterality: N/A;   LEFT HEART CATH AND CORONARY ANGIOGRAPHY N/A 02/15/2021   Procedure: LEFT HEART CATH AND CORONARY ANGIOGRAPHY;  Surgeon: Troy Sine, MD;  Location: [place] CV LAB;  Service: Cardiovascular;  Laterality: N/A;   LEFT HEART CATHETERIZATION WITH CORONARY ANGIOGRAM N/A 02/27/2012   Procedure: LEFT  HEART CATHETERIZATION WITH CORONARY ANGIOGRAM;  Surgeon: Lorretta Harp, MD;  Location: [facility] CATH LAB;  Service: Cardiovascular;  Laterality: N/A;   TRANSTHORACIC ECHOCARDIOGRAM  07/29/2010   EF 50=55%, mod inf wall hypokinesis & mild post wall hypokinesis; LA mild-mod dilated; mild mitral annular calcif & mild MR; mild TR & elevated RV systolic pressure; AV mildly sclerotic; mild aortic root dilatation    V TACH ABLATION N/A 01/20/2021   Procedure: V TACH ABLATION;  Surgeon: Vickie Epley, MD;  Location: [place] CV LAB;  Service: Cardiovascular;  Laterality: N/A;       Family History  Problem Relation Age of Onset   Heart attack Father     Social History   Tobacco Use   Smoking status: Former    Packs/day: 1.00    Years: 50.00    Pack years: 50.00    Types: Cigarettes    Quit date: 07/20/2016    Years since quitting: 4.7   Smokeless tobacco: Never  Vaping Use   Vaping Use: Never used  Substance Use Topics   Alcohol use: Not Currently  Alcohol/week: 0.0 standard drinks   Drug use: No    Home Medications Prior to Admission medications   Medication Sig Start Date End Date Taking? Authorizing Provider  albuterol (VENTOLIN HFA) 108 (90 Base) MCG/ACT inhaler Inhale 2 puffs into the lungs every 6 (six) hours as needed for wheezing or shortness of breath. 06/14/20  Yes Icard, Octavio Graves, DO  clopidogrel (PLAVIX) 75 MG tablet Take 1 tablet (75 mg total) by mouth daily with breakfast. 02/19/21  Yes Duke, Tami Lin, PA  ELIQUIS 5 MG TABS tablet TAKE 1 TABLET BY MOUTH TWICE A DAY Patient taking differently: Take 5 mg by mouth 2 (two) times daily. 03/25/21  Yes Camnitz, Will Hassell Done, MD  ENTRESTO 24-26 MG TAKE 1 TABLET BY MOUTH TWICE A DAY Patient taking differently: Take 1 tablet by mouth 2 (two) times daily. 10/08/20  Yes Troy Sine, MD  Fluticasone Furoate (ARNUITY ELLIPTA) 200 MCG/ACT AEPB Inhale 1 puff into the lungs daily. 12/29/20  Yes Icard, Bradley L, DO  glucose blood  (ONETOUCH ULTRA) test strip USE TO CHECK BLOOD SUGAR 3 TIMES A DAY (E11.9) Patient taking differently: 1 each by Other route 3 (three) times daily. USE TO CHECK BLOOD SUGAR 3 TIMES A DAY (E11.9) 12/09/20  Yes [name], MD  insulin aspart (NOVOLOG FLEXPEN) 100 UNIT/ML FlexPen INJECT 5-20 UNITS SUBCUTANEOUSLY WITH LUNCH AND DINNER Patient taking differently: Inject 6-8 Units into the skin daily as needed for high blood sugar. 06/16/20  Yes [name], MD  Insulin Pen Needle (NOVOFINE) 32G X 6 MM MISC 1 each by Other route daily. 08/19/19  Yes PickardCammie Mcgee, MD  JARDIANCE 25 MG TABS tablet TAKE 1 TABLET BY MOUTH EVERY DAY Patient taking differently: Take 25 mg by mouth daily. 11/09/20  Yes Pickard, [name], MD  LEVEMIR FLEXTOUCH 100 UNIT/ML FlexPen INJECT 46 UNITS INTO THE SKIN DAILY. DX:E11.9 Patient taking differently: Inject 46 Units into the skin daily. 01/11/21  Yes [name], MD  mexiletine (MEXITIL) 200 MG capsule Take 200 mg by mouth every 12 (twelve) hours. 03/03/21  Yes [provider]  montelukast (SINGULAIR) 10 MG tablet TAKE 1 TABLET BY MOUTH EVERY DAY Patient taking differently: Take 10 mg by mouth daily. 08/19/20  Yes [name], MD  nitroGLYCERIN (NITROSTAT) 0.4 MG SL tablet PLACE 1 TABLET (0.4 MG TOTAL) UNDER THE TONGUE EVERY 5 (FIVE) MINUTES AS NEEDED FOR CHEST PAIN. 07/27/20  Yes Troy Sine, MD  rosuvastatin (CRESTOR) 20 MG tablet Take 20 mg by mouth daily.   Yes [provider]  sotalol (BETAPACE) 160 MG tablet Take 1 tablet (160 mg total) by mouth 2 (two) times daily. 01/28/21  Yes Baldwin Jamaica, PA-C  mexiletine (MEXITIL) 150 MG capsule Take 2 capsules (300 mg total) by mouth every 12 (twelve) hours. Patient not taking: No sig reported 02/26/21   Ledora Bottcher, PA  rosuvastatin (CRESTOR) 40 MG tablet Take 1 tablet (40 mg total) by mouth daily. Patient not taking: Reported on 04/17/2021 02/18/21   Ledora Bottcher, PA     Allergies    Omega-3 fatty acids, Benazepril, and Fish allergy  Review of Systems   Review of Systems  Constitutional:  Positive for activity change.  Respiratory:  Negative for shortness of breath.   Cardiovascular:  Positive for chest pain and palpitations.  Neurological:  Positive for dizziness.  All other systems reviewed and are negative.  Physical Exam Updated Vital Signs BP (!) 117/91  Pulse 79   Temp 97.8 F (36.6 C) (Oral)   Resp 17   Ht 6' (1.829 m)   Wt 97.5 kg   SpO2 98%   BMI 29.16 kg/m   Physical Exam Vitals and nursing note reviewed.  Constitutional:      Appearance: He is well-developed.  HENT:     Head: Atraumatic.  Cardiovascular:     Rate and Rhythm: Tachycardia present.  Pulmonary:     Effort: Pulmonary effort is normal.  Musculoskeletal:     Cervical back: Neck supple.  Skin:    General: Skin is warm.  Neurological:     Mental Status: He is alert and oriented to person, place, and time.    ED Results / Procedures / Treatments   Labs (all labs ordered are listed, but only abnormal results are displayed) Labs Reviewed  CBC WITH DIFFERENTIAL/PLATELET - Abnormal; Notable for the following components:      Result Value   WBC 2.4 (*)    RBC 5.88 (*)    Hemoglobin 17.7 (*)    HCT 55.7 (*)    Platelets 141 (*)    Neutro Abs 1.2 (*)    Abs Immature Granulocytes 0.08 (*)    All other components within normal limits  COMPREHENSIVE METABOLIC PANEL - Abnormal; Notable for the following components:   Glucose, Bld 124 (*)    Calcium 8.6 (*)    Total Protein 5.8 (*)    All other components within normal limits  LIPID PANEL - Abnormal; Notable for the following components:   HDL 33 (*)    All other components within normal limits  TROPONIN I (HIGH SENSITIVITY) - Abnormal; Notable for the following components:   Troponin I (High Sensitivity) 25 (*)    All other components within normal limits  RESP PANEL BY RT-PCR (FLU A&B, COVID) ARPGX2   MRSA NEXT GEN BY PCR, NASAL  RESP PANEL BY RT-PCR (FLU A&B, COVID) ARPGX2  PROTIME-INR  APTT  HEMOGLOBIN A1C  TSH  T4, FREE  MAGNESIUM  TROPONIN I (HIGH SENSITIVITY)    EKG EKG Interpretation  Date/Time:  Sunday April 17 2021 12:55:21 EDT Ventricular Rate:  73 PR Interval:  107 QRS Duration: 121 QT Interval:  394 QTC Calculation: 435 R Axis:   45 Text Interpretation: Sinus rhythm Atrial premature complexes Short PR interval Nonspecific intraventricular conduction delay Inferolateral infarct, old No acute changes Confirmed by Varney Biles [phone]) on 04/17/2021 3:00:10 PM  Radiology DG Chest Port 1 View  Result Date: 04/17/2021 CLINICAL DATA:  Chest pain. EXAM: PORTABLE CHEST 1 VIEW COMPARISON:  February 14, 2021. FINDINGS: Stable cardiomegaly. Left-sided defibrillator is unchanged in position. Mildly increased bibasilar interstitial densities are noted concerning for possible edema or atelectasis. Bony thorax is unremarkable. IMPRESSION: Mildly increased bibasilar interstitial densities are noted concerning for possible edema or atelectasis. Electronically Signed   By: Marijo Conception M.D.   On: 04/17/2021 13:51    Procedures .Critical Care Performed by: Varney Biles, MD Authorized by: Varney Biles, MD   Critical care provider statement:    Critical care time (minutes):  42   Critical care was necessary to treat or prevent imminent or life-threatening deterioration of the following conditions:  Circulatory failure   Critical care was time spent personally by me on the following activities:  Development of treatment plan with patient or surrogate, discussions with consultants, evaluation of patient's response to treatment, examination of patient, interpretation of cardiac output measurements, obtaining history from patient or surrogate,  ordering and performing treatments and interventions, ordering and review of laboratory studies, ordering and review of radiographic  studies, pulse oximetry, re-evaluation of patient's condition and review of old charts   Medications Ordered in ED Medications  0.9 %  sodium chloride infusion ( Intravenous Not Given 04/17/21 1329)  amiodarone (NEXTERONE PREMIX) 360-4.14 MG/200ML-% (1.8 mg/mL) IV infusion (60 mg/hr Intravenous New Bag/Given 04/17/21 1313)  amiodarone (NEXTERONE PREMIX) 360-4.14 MG/200ML-% (1.8 mg/mL) IV infusion (has no administration in time range)  sacubitril-valsartan (ENTRESTO) 24-26 mg per tablet (has no administration in time range)  mexiletine (MEXITIL) capsule 300 mg (has no administration in time range)  nitroGLYCERIN (NITROSTAT) SL tablet 0.4 mg (has no administration in time range)  rosuvastatin (CRESTOR) tablet 40 mg (has no administration in time range)  empagliflozin (JARDIANCE) tablet 25 mg (has no administration in time range)  insulin detemir (LEVEMIR) FlexPen 46 Units (has no administration in time range)  clopidogrel (PLAVIX) tablet 75 mg (has no administration in time range)  apixaban (ELIQUIS) tablet 5 mg (has no administration in time range)  albuterol (VENTOLIN HFA) 108 (90 Base) MCG/ACT inhaler 2 puff (has no administration in time range)  Fluticasone Furoate AEPB 1 puff (has no administration in time range)  montelukast (SINGULAIR) tablet 10 mg (has no administration in time range)  acetaminophen (TYLENOL) tablet 650 mg (has no administration in time range)  ondansetron (ZOFRAN) injection 4 mg (has no administration in time range)  insulin aspart (novoLOG) injection 0-9 Units (has no administration in time range)  insulin aspart (novoLOG) injection 0-5 Units (has no administration in time range)    ED Course  I have reviewed the triage vital signs and the nursing notes.  Pertinent labs & imaging results that were available during my care of the patient were reviewed by me and considered in my medical decision making (see chart for details).    MDM Rules/Calculators/A&P                            70 year old comes in with chief complaint of chest pain.  He had an episode of severe chest pain that lasted just for a second.  Pain radiated to the back.  He is also been having some palpitations and dizziness.  Patient has history of CAD but also history of V. tach with defibrillator in place.  EKG reviewed.  There is some nonspecific ST changes, but it does not appear to be ischemic in nature and similar to his prior EKG.  Of note, during our bedside evaluation we noted that patient went into nonsustained V. tach.  Nursing staff reported that there was another episode of sustained V. tach that they had seen when they put the patient on telemetry at arrival.  Cardiology team at the bedside as there was STEMI activation on the field. Code STEMI was inactivated, but there is suspicion that patient might have gone into V. tach and had his defibrillator fire on him.  EMS had given him 150 mg of amnio for a sustained V. tach episode.  We have initiated the drip. Patient remained stable in the ED.  Final Clinical Impression(s) / ED Diagnoses Final diagnoses:  VT (ventricular tachycardia)    Rx / DC Orders ED Discharge Orders     None        Varney Biles, MD 04/17/21 1502

## 2021-04-17 NOTE — Telephone Encounter (Signed)
   Patient called the after hours line to report device shock.   Felt palpitations this morning around 10am followed by subsequent shock from device. He did not have any chest pain, dizziness, lightheadedness, or syncope. He sat down on the couch following his device shock and feels back to baseline. This is the first time he has been shocked. He notes recent trouble with palpitations and on device interrogation 04/13/21 he was noted to have frequent NSVT, and 59 episodes of VT of which 57 events were pace terminated. He has history of VT ablation 01/2021 with recurrence, currently on sotalol and mexiletine. He is scheduled for a bronchoscopy 04/26/21 with plans to admit 04/20/21 for transition to IV heparin/antiplatelet therapy given recent PCI/DES to PDA 02/15/21.  Discussed with Dr. Lovena Le who is rounding this weekend. Advised ongoing monitoring at home but if patient receives a 3rd shock he should activate EMS and be transported to the ED for evaluation.   Will route to Dr. Curt Bears and team to arrange close outpatient follow-up.   Abigail Butts, PA-C 04/17/21; 10:51 AM

## 2021-04-17 NOTE — Progress Notes (Signed)
This chaplain responded to the Code Stemi page in the ED.  The chaplain understands family has not arrived and spiritual care will be paged as needed.

## 2021-04-18 ENCOUNTER — Inpatient Hospital Stay (HOSPITAL_COMMUNITY): Payer: Medicare Other

## 2021-04-18 DIAGNOSIS — I472 Ventricular tachycardia, unspecified: Secondary | ICD-10-CM | POA: Diagnosis not present

## 2021-04-18 DIAGNOSIS — I4729 Other ventricular tachycardia: Secondary | ICD-10-CM | POA: Diagnosis not present

## 2021-04-18 DIAGNOSIS — I5022 Chronic systolic (congestive) heart failure: Secondary | ICD-10-CM

## 2021-04-18 LAB — LIPID PANEL
Cholesterol: 120 mg/dL (ref 0–200)
HDL: 30 mg/dL — ABNORMAL LOW (ref >40)
LDL Cholesterol: 74 mg/dL (ref 0–99)
Total CHOL/HDL Ratio: 4 RATIO
Triglycerides: 80 mg/dL (ref <150)
VLDL: 16 mg/dL (ref 0–40)

## 2021-04-18 LAB — COMPREHENSIVE METABOLIC PANEL
ALT: 13 U/L (ref 0–44)
AST: 15 U/L (ref 15–41)
Albumin: 3 g/dL — ABNORMAL LOW (ref 3.5–5.0)
Alkaline Phosphatase: 71 U/L (ref 38–126)
Anion gap: 5 (ref 5–15)
BUN: 15 mg/dL (ref 8–23)
CO2: 23 mmol/L (ref 22–32)
Calcium: 8.5 mg/dL — ABNORMAL LOW (ref 8.9–10.3)
Chloride: 110 mmol/L (ref 98–111)
Creatinine, Ser: 1.05 mg/dL (ref 0.61–1.24)
GFR, Estimated: >60 mL/min (ref >60)
Glucose, Bld: 108 mg/dL — ABNORMAL HIGH (ref 70–99)
Potassium: 3.7 mmol/L (ref 3.5–5.1)
Sodium: 138 mmol/L (ref 135–145)
Total Bilirubin: 0.5 mg/dL (ref 0.3–1.2)
Total Protein: 5.3 g/dL — ABNORMAL LOW (ref 6.5–8.1)

## 2021-04-18 LAB — ECHOCARDIOGRAM LIMITED
Area-P 1/2: 4.06 cm2
Calc EF: 20 %
Height: 72 in
MV M vel: 4.46 m/s
MV Peak grad: 79.6 mmHg
Radius: 0.4 cm
S' Lateral: 6.1 cm
Single Plane A2C EF: 29.8 %
Single Plane A4C EF: 14.9 %
Weight: 3347.46 oz

## 2021-04-18 LAB — BASIC METABOLIC PANEL
Anion gap: 10 (ref 5–15)
Anion gap: 5 (ref 5–15)
BUN: 13 mg/dL (ref 8–23)
BUN: 11 mg/dL (ref 8–23)
CO2: 21 mmol/L — ABNORMAL LOW (ref 22–32)
CO2: 24 mmol/L (ref 22–32)
Calcium: 8.5 mg/dL — ABNORMAL LOW (ref 8.9–10.3)
Calcium: 8.4 mg/dL — ABNORMAL LOW (ref 8.9–10.3)
Chloride: 108 mmol/L (ref 98–111)
Chloride: 110 mmol/L (ref 98–111)
Creatinine, Ser: 1.06 mg/dL (ref 0.61–1.24)
Creatinine, Ser: 1.01 mg/dL (ref 0.61–1.24)
GFR, Estimated: >60 mL/min (ref >60)
GFR, Estimated: >60 mL/min (ref >60)
Glucose, Bld: 89 mg/dL (ref 70–99)
Glucose, Bld: 162 mg/dL — ABNORMAL HIGH (ref 70–99)
Potassium: 3.2 mmol/L — ABNORMAL LOW (ref 3.5–5.1)
Potassium: 4.3 mmol/L (ref 3.5–5.1)
Sodium: 139 mmol/L (ref 135–145)
Sodium: 139 mmol/L (ref 135–145)

## 2021-04-18 LAB — GLUCOSE, CAPILLARY
Glucose-Capillary: 76 mg/dL (ref 70–99)
Glucose-Capillary: 167 mg/dL — ABNORMAL HIGH (ref 70–99)
Glucose-Capillary: 106 mg/dL — ABNORMAL HIGH (ref 70–99)
Glucose-Capillary: 148 mg/dL — ABNORMAL HIGH (ref 70–99)

## 2021-04-18 LAB — HEMOGLOBIN A1C
Hgb A1c MFr Bld: 6.9 % — ABNORMAL HIGH (ref 4.8–5.6)
Mean Plasma Glucose: 151.33 mg/dL

## 2021-04-18 LAB — MAGNESIUM
Magnesium: 2.2 mg/dL (ref 1.7–2.4)
Magnesium: 2.1 mg/dL (ref 1.7–2.4)

## 2021-04-18 MED ORDER — AMIODARONE IV BOLUS ONLY 150 MG/100ML
150.0000 mg | Freq: Once | INTRAVENOUS | Status: AC
  Administered 2021-04-18: 150 mg via INTRAVENOUS

## 2021-04-18 MED ORDER — POTASSIUM CHLORIDE 10 MEQ/100ML IV SOLN: 10.0000 meq | INTRAVENOUS | Status: DC

## 2021-04-18 MED ORDER — POTASSIUM CHLORIDE CRYS ER 20 MEQ PO TBCR
30.0000 meq | EXTENDED_RELEASE_TABLET | Freq: Once | ORAL | Status: AC
  Administered 2021-04-18: 30 meq via ORAL
  Filled 2021-04-18: qty 1

## 2021-04-18 MED ORDER — POTASSIUM CHLORIDE CRYS ER 20 MEQ PO TBCR
40.0000 meq | EXTENDED_RELEASE_TABLET | Freq: Once | ORAL | Status: AC
  Administered 2021-04-18: 40 meq via ORAL
  Filled 2021-04-18: qty 2

## 2021-04-18 MED ORDER — SPIRONOLACTONE 25 MG PO TABS
25.0000 mg | ORAL_TABLET | Freq: Every day | ORAL | Status: DC
  Administered 2021-04-18 – 2021-04-29 (×12): 25 mg via ORAL
  Filled 2021-04-18 (×12): qty 1

## 2021-04-18 MED ORDER — METOPROLOL TARTRATE 25 MG PO TABS
25.0000 mg | ORAL_TABLET | Freq: Two times a day (BID) | ORAL | Status: DC
  Administered 2021-04-18 – 2021-04-19 (×2): 25 mg via ORAL
  Filled 2021-04-18 (×2): qty 1

## 2021-04-18 MED ORDER — CHLORHEXIDINE GLUCONATE CLOTH 2 % EX PADS
6.0000 | MEDICATED_PAD | Freq: Every day | CUTANEOUS | Status: DC
  Administered 2021-04-18 – 2021-04-28 (×8): 6 via TOPICAL

## 2021-04-18 MED ORDER — MAGNESIUM SULFATE 2 GM/50ML IV SOLN
2.0000 g | Freq: Once | INTRAVENOUS | Status: AC
  Administered 2021-04-18: 2 g via INTRAVENOUS
  Filled 2021-04-18: qty 50

## 2021-04-18 NOTE — Progress Notes (Addendum)
Cardiology brief note  Patient had a sustained run of V-tach on amio and lidocaine drips earlier at ~7pm per the nurse. I received a call just now that patient is having continued runs of non-sustained V-tach, remains hemodynamically stable and has no complains at this juncture.  - Rebolus with amiodarone 150 mg x 1 - Continue lidocaine and amiodarone drips - Metoprolol 25 mg bid - IV mag 2 mg x 1 and Kcl infusions - Check BMP  Addendum 0359 Patient had recurrent sustained V-tach. Re bolused with lidocaine and doubled drip rate. Able to break out of wide complex rhythm.   - Continue amiodarone and lidocaine drips - EP follow up in the morning, likely will require re-do ablation.

## 2021-04-18 NOTE — Progress Notes (Addendum)
Electrophysiology Rounding Note  Patient Name: John [name] Date of Encounter: 04/18/2021  Primary Cardiologist: John Majestic, MD Electrophysiologist: John Haw, MD   Subjective   The patient is fatigued this am but in no acute distress. Overall fatigued with feeling poorly over the past few months.   Inpatient Medications    Scheduled Meds:  apixaban  5 mg Oral BID   budesonide  2 mL Inhalation BID   Chlorhexidine Gluconate Cloth  6 each Topical Daily   clopidogrel  75 mg Oral Q breakfast   empagliflozin  25 mg Oral Daily   insulin aspart  0-5 Units Subcutaneous QHS   insulin aspart  0-9 Units Subcutaneous TID WC   insulin detemir  46 Units Subcutaneous Daily   montelukast  10 mg Oral Daily   rosuvastatin  40 mg Oral QHS   sacubitril-valsartan  1 tablet Oral BID   Continuous Infusions:  sodium chloride     amiodarone 30 mg/hr (04/18/21 0400)   lidocaine 1 mg/min (04/18/21 0400)   PRN Meds: acetaminophen, albuterol, nitroGLYCERIN, ondansetron (ZOFRAN) IV   Vital Signs    Vitals:   04/18/21 0100 04/18/21 0200 04/18/21 0300 04/18/21 0500  BP: (!) 166/100 (!) 145/98 (!) 115/104   Pulse: 69 70 67   Resp: 20 15 20    Temp:      TempSrc:      SpO2: 93% 92% 95%   Weight:    94.9 kg  Height:        Intake/Output Summary (Last 24 hours) at 04/18/2021 0714 Last data filed at 04/18/2021 0400 Gross per 24 hour  Intake 1294.17 ml  Output 800 ml  Net 494.17 ml   Filed Weights   04/17/21 1300 04/18/21 0500  Weight: 97.5 kg 94.9 kg    Physical Exam    GEN- The patient is well appearing, alert and oriented x 3 today.   Head- normocephalic, atraumatic Eyes-  Sclera clear, conjunctiva pink Ears- hearing intact Oropharynx- clear Neck- supple Lungs- Clear to ausculation bilaterally, normal work of breathing Heart- Regular rate and rhythm, no murmurs, rubs or gallops GI- soft, NT, ND, + BS Extremities- no clubbing or cyanosis. No edema Skin- no  rash or lesion Psych- euthymic mood, full affect Neuro- strength and sensation are intact  Labs    CBC Recent Labs    04/17/21 1250  WBC 2.4*  NEUTROABS 1.2*  HGB 17.7*  HCT 55.7*  MCV 94.7  PLT 035*   Basic Metabolic Panel Recent Labs    04/17/21 1250 04/17/21 1612 04/18/21 0133  NA 141  --  139  K 3.7  --  3.2*  CL 109  --  108  CO2 23  --  21*  GLUCOSE 124*  --  89  BUN 15  --  13  CREATININE 1.01  --  1.06  CALCIUM 8.6*  --  8.5*  MG  --  2.0  --    Liver Function Tests Recent Labs    04/17/21 1250  AST 23  ALT 18  ALKPHOS 71  BILITOT 1.1  PROT 5.8*  ALBUMIN 3.5   No results for input(s): LIPASE, AMYLASE in the last 72 hours. Cardiac Enzymes No results for input(s): CKTOTAL, CKMB, CKMBINDEX, TROPONINI in the last 72 hours.   Telemetry    NSR currently. Over night continued to have brief episodes of MMVT. , (personally reviewed)  EKG of ventricular tachycardia demonstrates a right bundle left superior axis VT with essentially positive precordial  concordance.  1 and aVL are positive.  Radiology    DG Chest Port 1 View  Result Date: 04/17/2021 CLINICAL DATA:  Chest pain. EXAM: PORTABLE CHEST 1 VIEW COMPARISON:  February 14, 2021. FINDINGS: Stable cardiomegaly. Left-sided defibrillator is unchanged in position. Mildly increased bibasilar interstitial densities are noted concerning for possible edema or atelectasis. Bony thorax is unremarkable. IMPRESSION: Mildly increased bibasilar interstitial densities are noted concerning for possible edema or atelectasis. Electronically Signed   By: John [name] JohnD.   On: 04/17/2021 13:51    Patient Profile     John [name] is a 70 y.o. male with a past medical history significant for CHF, CAD, ILD, and VT s/p ablation.  he was admitted for VT storm.   VT ablation 01/2021 At that time to distinct ventricular tachycardias were noted.  One was a posterolateral VT that was successfully ablated near the  papillary muscle.  A second VT was not targeted for ablation but was described as a left superior axis VT  Assessment & Plan    Recurrent hemodynamically intolerable sustained MMVT EKG of ventricular tachycardia demonstrates a right bundle left superior axis VT with essentially positive precordial concordance.  1 and aVL are positive. Continue IV amiodarone and lidocaine this am If off target lidocaine effects John [name] transition back to John [name] Can re-bolus amio as needed, no other good medical options at this point with sotalol failure.  Keep K > 4.0 and Mg > 2.0. Supp K, update Mg If poorly controlled by amio, potentially plan re-do ablation later this week.  2. Chronic systolic CHF Echo 82-42% Add spiro 25 mg daily Continue Entresto and Jardiance as tolerated.  3. CAD ACS in 02/15/2021 with PCI requiring PTCA to the RCA Troponin flat this admission.   4. Lung mass Plans in works for biopsy off anti-platelets John [name] confer with John [name] to see if would still plan on proceeding, during this admission with adequate planning.   Continue IV amiodarone. Consider re-do ablation mid-week pending course.   For questions or updates, please contact John [name] Please consult [email or web address] for contact info under Cardiology/STEMI.  Signed, John Friar, PA-C  04/18/2021, 7:14 AM   I have seen and examined this patient with John [name].  Agree with above, note added to reflect my findings.  On exam, RRR, no murmurs.  Continue to have VT overnight last night, though this was slower than his prior episodes.  He does have a faster episodes of nonsustained VT.  His potassium was found to be low at 3.2 this morning and is being repleted.  John [name] continue IV amiodarone and lidocaine at the current doses.  He does unfortunately have lung disease, though amiodarone may be his last option.  He is scheduled for a lung biopsy on 11/8.  I have discussed this with his pulmonologist and John John [name] likely  keep him in the hospital until his biopsy can be performed.  If biopsy is going to be done, he John [name] need to switch to IV antiplatelets on Wednesday.  John Maicy [name] alert pharmacy about this.  John [name] M. Kamarri Lovvorn MD 04/18/2021 10:29 AM

## 2021-04-18 NOTE — Progress Notes (Signed)
  Echocardiogram 2D Echocardiogram has been performed.  Bobbye Charleston 04/18/2021, 9:36 AM

## 2021-04-18 NOTE — Progress Notes (Signed)
Increasing frequency of Vtach runs. Patient asymptomatic at this time. Cardiology paged and ordered for 2mg  of magnesium IV, 30 mEQ of K+, and 150mg  amio bolus. Labs to be drawn as well. RN also addressed high blood pressure [id number] systolic.

## 2021-04-18 NOTE — Progress Notes (Signed)
Remote ICD transmission.   

## 2021-04-19 ENCOUNTER — Encounter (HOSPITAL_COMMUNITY): Payer: Self-pay | Admitting: Anesthesiology

## 2021-04-19 ENCOUNTER — Other Ambulatory Visit: Payer: Self-pay

## 2021-04-19 DIAGNOSIS — I472 Ventricular tachycardia, unspecified: Secondary | ICD-10-CM | POA: Diagnosis not present

## 2021-04-19 LAB — GLUCOSE, CAPILLARY
Glucose-Capillary: 147 mg/dL — ABNORMAL HIGH (ref 70–99)
Glucose-Capillary: 133 mg/dL — ABNORMAL HIGH (ref 70–99)
Glucose-Capillary: 111 mg/dL — ABNORMAL HIGH (ref 70–99)
Glucose-Capillary: 184 mg/dL — ABNORMAL HIGH (ref 70–99)

## 2021-04-19 LAB — MAGNESIUM: Magnesium: 2.6 mg/dL — ABNORMAL HIGH (ref 1.7–2.4)

## 2021-04-19 LAB — BASIC METABOLIC PANEL
Anion gap: 7 (ref 5–15)
BUN: 14 mg/dL (ref 8–23)
CO2: 23 mmol/L (ref 22–32)
Calcium: 8.7 mg/dL — ABNORMAL LOW (ref 8.9–10.3)
Chloride: 108 mmol/L (ref 98–111)
Creatinine, Ser: 1.03 mg/dL (ref 0.61–1.24)
GFR, Estimated: >60 mL/min (ref >60)
Glucose, Bld: 85 mg/dL (ref 70–99)
Potassium: 3.9 mmol/L (ref 3.5–5.1)
Sodium: 138 mmol/L (ref 135–145)

## 2021-04-19 MED ORDER — METOPROLOL TARTRATE 50 MG PO TABS
50.0000 mg | ORAL_TABLET | Freq: Two times a day (BID) | ORAL | Status: DC
  Administered 2021-04-19 – 2021-04-29 (×20): 50 mg via ORAL
  Filled 2021-04-19 (×20): qty 1

## 2021-04-19 MED ORDER — METOPROLOL TARTRATE 25 MG PO TABS
25.0000 mg | ORAL_TABLET | Freq: Once | ORAL | Status: AC
  Administered 2021-04-19: 25 mg via ORAL
  Filled 2021-04-19: qty 1

## 2021-04-19 MED ORDER — POTASSIUM CHLORIDE CRYS ER 20 MEQ PO TBCR
40.0000 meq | EXTENDED_RELEASE_TABLET | Freq: Once | ORAL | Status: AC
  Administered 2021-04-19: 40 meq via ORAL
  Filled 2021-04-19: qty 2

## 2021-04-19 MED ORDER — LIDOCAINE BOLUS VIA INFUSION
1.0000 mg/kg | Freq: Once | INTRAVENOUS | Status: AC
  Administered 2021-04-19: 77.6 mg via INTRAVENOUS
  Filled 2021-04-19: qty 80

## 2021-04-19 MED ORDER — ZOLPIDEM TARTRATE 5 MG PO TABS
5.0000 mg | ORAL_TABLET | Freq: Every evening | ORAL | Status: DC | PRN
  Administered 2021-04-19: 5 mg via ORAL
  Filled 2021-04-19: qty 1

## 2021-04-19 MED ORDER — HYDRALAZINE HCL 25 MG PO TABS
25.0000 mg | ORAL_TABLET | Freq: Three times a day (TID) | ORAL | Status: DC | PRN
  Administered 2021-04-19: 25 mg via ORAL
  Filled 2021-04-19: qty 1

## 2021-04-19 NOTE — Progress Notes (Signed)
  Discussed with Dr. Curt Bears and Pharmacy  Stop plavix after am dose  Stop Eliquis after am dose. Place SCDs.   Consider IV anti-platelets starting tomorrow (evening) vs Thursday am pending course for potential lung biopsy.   Legrand Como [address]" [place], PA-C  04/19/2021 10:20 AM

## 2021-04-19 NOTE — Plan of Care (Signed)

## 2021-04-19 NOTE — Progress Notes (Addendum)
Electrophysiology Rounding Note  Patient Name: John [name] Date of Encounter: 04/19/2021  Primary Cardiologist: Shelva Majestic, MD Electrophysiologist: Constance Haw, MD   Subjective   Feeling slightly dizzy with movement (chronic s/p stroke) and SOB with mild activity.  He does not feel palpitations this am as usually with his VT, despite HRs in 110s.  Inpatient Medications    Scheduled Meds:  apixaban  5 mg Oral BID   budesonide  2 mL Inhalation BID   Chlorhexidine Gluconate Cloth  6 each Topical Daily   clopidogrel  75 mg Oral Q breakfast   empagliflozin  25 mg Oral Daily   insulin aspart  0-5 Units Subcutaneous QHS   insulin aspart  0-9 Units Subcutaneous TID WC   insulin detemir  46 Units Subcutaneous Daily   metoprolol tartrate  25 mg Oral BID   montelukast  10 mg Oral Daily   rosuvastatin  40 mg Oral QHS   sacubitril-valsartan  1 tablet Oral BID   spironolactone  25 mg Oral Daily   Continuous Infusions:  sodium chloride     amiodarone 30 mg/hr (04/18/21 2237)   lidocaine 2 mg/min (04/19/21 0600)   PRN Meds: acetaminophen, albuterol, nitroGLYCERIN, ondansetron (ZOFRAN) IV, zolpidem   Vital Signs    Vitals:   04/19/21 0400 04/19/21 0500 04/19/21 0600 04/19/21 0645  BP: (!) 138/107 104/79 105/87   Pulse: (!) 109 (!) 112 (!) 112   Resp: (!) 9 16 15    Temp: 98 F (36.7 C)   97.7 F (36.5 C)  TempSrc: Oral   Oral  SpO2: 96% 93% 95%   Weight:  97.4 kg    Height:        Intake/Output Summary (Last 24 hours) at 04/19/2021 0705 Last data filed at 04/19/2021 0600 Gross per 24 hour  Intake 1821.71 ml  Output 2020 ml  Net -198.29 ml   Filed Weights   04/17/21 1300 04/18/21 0500 04/19/21 0500  Weight: 97.5 kg 94.9 kg 97.4 kg    Physical Exam    GEN- The patient is fatigued appearing, alert and oriented x 3 today.   Head- normocephalic, atraumatic Eyes-  Sclera clear, conjunctiva pink Ears- hearing intact Oropharynx- clear Neck-  supple Lungs- Clear to ausculation bilaterally, normal work of breathing Heart- Tachy but Regular rate and rhythm, no murmurs, rubs or gallops GI- soft, NT, ND, + BS Extremities- no clubbing or cyanosis. No edema Skin- no rash or lesion Psych- euthymic mood, full affect Neuro- strength and sensation are intact  Labs    CBC Recent Labs    04/17/21 1250  WBC 2.4*  NEUTROABS 1.2*  HGB 17.7*  HCT 55.7*  MCV 94.7  PLT 267*   Basic Metabolic Panel Recent Labs    04/18/21 2238 04/19/21 0113  NA 138 138  K 3.7 3.9  CL 110 108  CO2 23 23  GLUCOSE 108* 85  BUN 15 14  CREATININE 1.05 1.03  CALCIUM 8.5* 8.7*  MG 2.1 2.6*   Liver Function Tests Recent Labs    04/17/21 1250 04/18/21 2238  AST 23 15  ALT 18 13  ALKPHOS 71 71  BILITOT 1.1 0.5  PROT 5.8* 5.3*  ALBUMIN 3.5 3.0*   No results for input(s): LIPASE, AMYLASE in the last 72 hours. Cardiac Enzymes No results for input(s): CKTOTAL, CKMB, CKMBINDEX, TROPONINI in the last 72 hours.   Telemetry    This morning in a WCT in 110s appears to be a flutter, overnight had  frequent runs of NSVT and sustained VT (personally reviewed)  Radiology    DG Chest Port 1 View  Result Date: 04/17/2021 CLINICAL DATA:  Chest pain. EXAM: PORTABLE CHEST 1 VIEW COMPARISON:  February 14, 2021. FINDINGS: Stable cardiomegaly. Left-sided defibrillator is unchanged in position. Mildly increased bibasilar interstitial densities are noted concerning for possible edema or atelectasis. Bony thorax is unremarkable. IMPRESSION: Mildly increased bibasilar interstitial densities are noted concerning for possible edema or atelectasis. Electronically Signed   By: Marijo Conception M.D.   On: 04/17/2021 13:51   ECHOCARDIOGRAM LIMITED  Result Date: 04/18/2021    ECHOCARDIOGRAM LIMITED REPORT   Patient Name:   John [name] Date of Exam: 04/18/2021 Medical Rec #:  [id number]        Height:       72.0 in Accession #:    [id number]       Weight:        209.2 lb Date of Birth:  1950/12/26        BSA:          2.172 m Patient Age:    71 years         BP:           144/96 mmHg Patient Gender: M                HR:           70 bpm. Exam Location:  Inpatient Procedure: 2D Echo, 3D Echo, Cardiac Doppler and Color Doppler Indications:    I47.2 Ventricular tachycardia  History:        Patient has prior history of Echocardiogram examinations, most                 recent 02/15/2021. CAD, Abnormal ECG, Stroke,                 Signs/Symptoms:Chest Pain; Risk Factors:Current Smoker and                 Diabetes.  Sonographer:    [place] Referring Phys: [id number] [place]  1. Left ventricular ejection fraction, by estimation, is 25 to 30%. Left ventricular ejection fraction by 3D volume is 27 %. The left ventricle has severely decreased function. The left ventricle demonstrates regional wall motion abnormalities (see scoring diagram/findings for description). The left ventricular internal cavity size was moderately dilated. Left ventricular diastolic parameters are indeterminate. Elevated left ventricular end-diastolic pressure. There is severe hypokinesis of the left ventricular, basal-mid inferoseptal wall and lateral wall. There is akinesis of the left ventricular, basal-mid inferior wall and inferolateral wall.  2. Right ventricular systolic function is normal. The right ventricular size is mildly enlarged. There is normal pulmonary artery systolic pressure. The estimated right ventricular systolic pressure is 29.5 mmHg.  3. The mitral valve is normal in structure. Mild mitral valve regurgitation. No evidence of mitral stenosis.  4. The aortic valve is normal in structure. Aortic valve regurgitation is not visualized. No aortic stenosis is present.  5. Aortic dilatation noted. There is mild dilatation of the aortic root, measuring 38 mm. There is mild dilatation of the ascending aorta, measuring 43 mm.  6. The inferior vena cava is dilated in size  with <50% respiratory variability, suggesting right atrial pressure of 15 mmHg.  7. Compared to study dated 02/15/2021, LVF has declined FINDINGS  Left Ventricle: Left ventricular ejection fraction, by estimation, is 25 to 30%. Left ventricular ejection fraction by 3D volume is 27 %.  The left ventricle has severely decreased function. The left ventricle demonstrates regional wall motion abnormalities. Severe hypokinesis of the left ventricular, basal-mid inferoseptal wall and lateral wall. The left ventricular internal cavity size was moderately dilated. There is no left ventricular hypertrophy. Left ventricular diastolic parameters are  indeterminate. Elevated left ventricular end-diastolic pressure. Right Ventricle: The right ventricular size is mildly enlarged. No increase in right ventricular wall thickness. Right ventricular systolic function is normal. There is normal pulmonary artery systolic pressure. The tricuspid regurgitant velocity is 2.17  m/s, and with an assumed right atrial pressure of 15 mmHg, the estimated right ventricular systolic pressure is 75.1 mmHg. Left Atrium: Left atrial size was normal in size. Right Atrium: Right atrial size was normal in size. Pericardium: There is no evidence of pericardial effusion. Mitral Valve: The mitral valve is normal in structure. Mild mitral valve regurgitation. No evidence of mitral valve stenosis. Tricuspid Valve: The tricuspid valve is normal in structure. Tricuspid valve regurgitation is trivial. No evidence of tricuspid stenosis. Aortic Valve: The aortic valve is normal in structure. Aortic valve regurgitation is not visualized. No aortic stenosis is present. Pulmonic Valve: The pulmonic valve was normal in structure. Pulmonic valve regurgitation is trivial. No evidence of pulmonic stenosis. Aorta: Aortic dilatation noted. There is mild dilatation of the aortic root, measuring 38 mm. There is mild dilatation of the ascending aorta, measuring 43 mm. Venous:  The inferior vena cava is dilated in size with less than 50% respiratory variability, suggesting right atrial pressure of 15 mmHg. IAS/Shunts: No atrial level shunt detected by color flow Doppler. LEFT VENTRICLE PLAX 2D LVIDd:         6.30 cm         Diastology LVIDs:         6.10 cm         LV e' medial:    4.58 cm/s LV PW:         1.60 cm         LV E/e' medial:  18.2 LV IVS:        1.10 cm         LV e' lateral:   5.10 cm/s LVOT diam:     2.30 cm         LV E/e' lateral: 16.3 LV SV:         67 LV SV Index:   31 LVOT Area:     4.15 cm        3D Volume EF                                LV 3D EF:    Left                                             ventricul LV Volumes (MOD)                            ar LV vol d, MOD    188.0 ml                   ejection A2C:  fraction LV vol d, MOD    188.0 ml                   by 3D A4C:                                        volume is LV vol s, MOD    132.0 ml                   27 %. A2C: LV vol s, MOD    160.0 ml A4C:                           3D Volume EF: LV SV MOD A2C:   56.0 ml       3D EF:        27 % LV SV MOD A4C:   188.0 ml      LV EDV:       216 ml LV SV MOD BP:    38.0 ml       LV ESV:       157 ml                                LV SV:        59 ml RIGHT VENTRICLE            IVC RV S prime:     9.30 cm/s  IVC diam: 2.20 cm TAPSE (M-mode): 1.8 cm LEFT ATRIUM             Index        RIGHT ATRIUM           Index LA diam:        4.20 cm 1.93 cm/m   RA Area:     18.40 cm LA Vol (A2C):   72.7 ml 33.48 ml/m  RA Volume:   48.70 ml  22.42 ml/m LA Vol (A4C):   52.4 ml 24.13 ml/m LA Biplane Vol: 64.9 ml 29.88 ml/m  AORTIC VALVE             PULMONIC VALVE LVOT Vmax:   76.40 cm/s  PR End Diast Vel: 1.80 msec LVOT Vmean:  50.500 cm/s LVOT VTI:    0.161 m  AORTA Ao Root diam: 3.80 cm Ao Asc diam:  4.30 cm MITRAL VALVE                  TRICUSPID VALVE MV Area (PHT): 4.06 cm       TR Peak grad:   18.8 mmHg MV Decel Time: 187 msec       TR  Vmax:        217.00 cm/s MR Peak grad:    79.6 mmHg MR Mean grad:    51.0 mmHg    SHUNTS MR Vmax:         446.00 cm/s  Systemic VTI:  0.16 m MR Vmean:        336.0 cm/s   Systemic Diam: 2.30 cm MR PISA:         1.01 cm MR PISA Eff ROA: 8 mm MR PISA Radius:  0.40 cm MV E velocity: 83.30 cm/s MV A velocity: 69.80 cm/s MV E/A ratio:  1.19 Fransico Him MD Electronically signed by Fransico Him  MD Signature Date/Time: 04/18/2021/11:04:20 AM    Final     Patient Profile     JAHREE DERMODY is a 70 y.o. male with a past medical history significant for CHF, CAD, ILD, and VT s/p ablation.  he was admitted for VT storm.    VT ablation 01/2021 At that time to distinct ventricular tachycardias were noted.  One was a posterolateral VT that was successfully ablated near the papillary muscle.  A second VT was not targeted for ablation but was described as a left superior axis VT  Assessment & Plan      Recurrent hemodynamically intolerable sustained MMVT EKG of ventricular tachycardia demonstrates a right bundle left superior axis VT with essentially positive precordial concordance.  1 and aVL are positive. Continue to have increased VT overnight Remains on IV amiodarone 30 mg/hr Remains on IV lidocaine at 2 mg/hr. Do not think he should be on this long.  If off target lidocaine effects Canon Gola transition back to mexitil Keep K > 4.0 and Mg > 2.0. K 3.9, Mg 2.6 this am.   Device interrogation showed 1:1 rhythm consistent with VT with retrograde conduction. Pt was paced out at bedside with Dr. Curt Bears. Returned to NSR in 80s   2. Chronic systolic CHF Echo 08-14% -> 20-30% in setting of recurrent VT Continue spiro 25 mg daily Continue Entresto and Jardiance as tolerated.   3. CAD ACS in 02/15/2021 with PCI requiring PTCA to the RCA Troponin flat this admission.    4. Lung mass Plans in works for biopsy off anti-platelets Potentially Ilia Engelbert keep admitted and plan on biopsy with CCM next week pending  course.   5. Goals of Care If ablation is ineffective or unsuccessful we have no further medical options to treat his VT. It may continue to quiet down as amiodarone load continues.  Danali Marinos discuss with pt the possibility of palliative care consult for assistance through upcoming decisions with lung mass and VT.   For questions or updates, please contact [place] Please consult [email or web address] for contact info under Cardiology/STEMI.  Signed, Shirley Friar, PA-C  04/19/2021, 7:05 AM   I have seen and examined this patient with [name].  Agree with above, note added to reflect my findings.  On exam, RRR, no murmurs.  Patient had continued episodes of ventricular tachycardia.  He went into VT last night, and has had a continuous episode.  On device interrogation this morning, he was Found to have ventricular tachycardia at a rate of 115 bpm.  He was paced out of his tachycardia using his defibrillator program into sinus rhythm.  A few hours thereafter, he went back into tachycardia with rates of 105 to 110 bpm.  Again he was paced out of his tachycardia.  He is symptomatic with tachycardia.  He is short of breath, but his blood pressure has remained stable.  We Rosella Crandell continue IV amiodarone, bolus with 60 mg/kg/h for the next 6 hours.  If he continues to have episodes of ventricular tachycardia, he may require ablation.  Amando Ishikawa M. [place] Berkey MD 04/19/2021 2:44 PM

## 2021-04-19 NOTE — Progress Notes (Signed)
  Pt had recurrent VT, this time in 107-109 range confirmed by device interrogation.   Reviewed with Dr. Curt Bears and was once again able to pace out at CL of 440 ms.    BP remains elevated with diastolic in 841Q.  Will increase lopressor to 50 mg BID.   Legrand Como [address] [place], PA-C  04/19/2021 2:29 PM

## 2021-04-20 ENCOUNTER — Inpatient Hospital Stay: Admit: 2021-04-20 | Payer: Medicare Other | Admitting: Pulmonary Disease

## 2021-04-20 ENCOUNTER — Encounter (HOSPITAL_COMMUNITY)
Admission: EM | Disposition: A | Discharge status: Home / Self Care | Payer: Self-pay | Source: Home / Self Care | Attending: Internal Medicine

## 2021-04-20 DIAGNOSIS — R911 Solitary pulmonary nodule: Secondary | ICD-10-CM | POA: Insufficient documentation

## 2021-04-20 DIAGNOSIS — I472 Ventricular tachycardia, unspecified: Secondary | ICD-10-CM | POA: Diagnosis not present

## 2021-04-20 LAB — BASIC METABOLIC PANEL
Anion gap: 7 (ref 5–15)
BUN: 13 mg/dL (ref 8–23)
CO2: 22 mmol/L (ref 22–32)
Calcium: 8.5 mg/dL — ABNORMAL LOW (ref 8.9–10.3)
Chloride: 106 mmol/L (ref 98–111)
Creatinine, Ser: 0.94 mg/dL (ref 0.61–1.24)
GFR, Estimated: >60 mL/min (ref >60)
Glucose, Bld: 103 mg/dL — ABNORMAL HIGH (ref 70–99)
Potassium: 3.9 mmol/L (ref 3.5–5.1)
Sodium: 135 mmol/L (ref 135–145)

## 2021-04-20 LAB — CBC
HCT: 55 % — ABNORMAL HIGH (ref 39.0–52.0)
Hemoglobin: 17.8 g/dL — ABNORMAL HIGH (ref 13.0–17.0)
MCH: 29.7 pg (ref 26.0–34.0)
MCHC: 32.4 g/dL (ref 30.0–36.0)
MCV: 91.8 fL (ref 80.0–100.0)
Platelets: 174 10*3/uL (ref 150–400)
RBC: 5.99 MIL/uL — ABNORMAL HIGH (ref 4.22–5.81)
RDW: 13.6 % (ref 11.5–15.5)
WBC: 8.3 10*3/uL (ref 4.0–10.5)
nRBC: 0 % (ref 0.0–0.2)

## 2021-04-20 LAB — GLUCOSE, CAPILLARY
Glucose-Capillary: 151 mg/dL — ABNORMAL HIGH (ref 70–99)
Glucose-Capillary: 146 mg/dL — ABNORMAL HIGH (ref 70–99)
Glucose-Capillary: 182 mg/dL — ABNORMAL HIGH (ref 70–99)
Glucose-Capillary: 128 mg/dL — ABNORMAL HIGH (ref 70–99)

## 2021-04-20 LAB — MAGNESIUM: Magnesium: 2.2 mg/dL (ref 1.7–2.4)

## 2021-04-20 SURGERY — V TACH ABLATION
Anesthesia: General

## 2021-04-20 MED ORDER — PHENYLEPHRINE-MINERAL OIL-PET 0.25-14-74.9 % RE OINT
1.0000 "application " | TOPICAL_OINTMENT | Freq: Two times a day (BID) | RECTAL | Status: DC | PRN
  Filled 2021-04-20: qty 57

## 2021-04-20 MED ORDER — POTASSIUM CHLORIDE CRYS ER 20 MEQ PO TBCR
60.0000 meq | EXTENDED_RELEASE_TABLET | Freq: Once | ORAL | Status: AC
  Administered 2021-04-20: 60 meq via ORAL
  Filled 2021-04-20: qty 3

## 2021-04-20 MED ORDER — FUROSEMIDE 10 MG/ML IJ SOLN
40.0000 mg | Freq: Once | INTRAMUSCULAR | Status: AC
  Administered 2021-04-20: 40 mg via INTRAVENOUS
  Filled 2021-04-20: qty 4

## 2021-04-20 MED ORDER — TIROFIBAN HCL IN NACL 5-0.9 MG/100ML-% IV SOLN
0.1500 ug/kg/min | INTRAVENOUS | Status: AC
  Administered 2021-04-20 – 2021-04-26 (×25): 0.15 ug/kg/min via INTRAVENOUS
  Filled 2021-04-20 (×27): qty 100

## 2021-04-20 MED ORDER — POLYETHYLENE GLYCOL 3350 17 G PO PACK: 17.0000 g | PACK | Freq: Every day | ORAL | Status: DC

## 2021-04-20 MED ORDER — DOCUSATE SODIUM 100 MG PO CAPS
100.0000 mg | ORAL_CAPSULE | Freq: Two times a day (BID) | ORAL | Status: DC
  Administered 2021-04-20 – 2021-04-29 (×15): 100 mg via ORAL
  Filled 2021-04-20 (×19): qty 1

## 2021-04-20 MED ORDER — HYDROCORTISONE 1 % EX CREA
TOPICAL_CREAM | Freq: Two times a day (BID) | CUTANEOUS | Status: DC | PRN
  Filled 2021-04-20: qty 28

## 2021-04-20 MED ORDER — CALCIUM POLYCARBOPHIL 625 MG PO TABS
625.0000 mg | ORAL_TABLET | Freq: Every day | ORAL | Status: DC
  Administered 2021-04-20 – 2021-04-29 (×10): 625 mg via ORAL
  Filled 2021-04-20 (×10): qty 1

## 2021-04-20 MED ORDER — AMIODARONE HCL IN DEXTROSE 360-4.14 MG/200ML-% IV SOLN
30.0000 mg/h | INTRAVENOUS | Status: AC
  Administered 2021-04-20 – 2021-04-22 (×4): 30 mg/h via INTRAVENOUS
  Filled 2021-04-20 (×3): qty 200

## 2021-04-20 MED ORDER — MEXILETINE HCL 150 MG PO CAPS
300.0000 mg | ORAL_CAPSULE | Freq: Two times a day (BID) | ORAL | Status: DC
  Administered 2021-04-20 – 2021-04-29 (×19): 300 mg via ORAL
  Filled 2021-04-20 (×21): qty 2

## 2021-04-20 NOTE — Consult Note (Signed)
NAME:  CHRISTINE SCHIEFELBEIN, MRN:  [id number], DOB:  05-02-1951, LOS: 3 ADMISSION DATE:  04/17/2021, CONSULTATION DATE:  04/20/21 REFERRING MD:  Lovena Le, CHIEF COMPLAINT:  palpitations    History of Present Illness:  70 year old man with complex ischemic heart history and recurrent Vtach who presented for sustained VT requiring cardioversion in ER.  He has been on EP service for management of this but now is improved.  His current complaints include hemorrhoids and constipation.  He has a rapidly enlarging RUL nodule that there was plan to biopsy on Tuesday with Dr. Valeta Harms.  We are going to have to bridge him with IV antiplatelet therapy due to recent DES.    Pertinent  Medical History   Past Medical History:  Diagnosis Date   Atrial flutter ([place])    s/p cardioversion   Coronary artery disease    Diabetes mellitus    GERD (gastroesophageal reflux disease)    History of nuclear stress test 04/04/2011   lexiscan; mod-large in size fixed inferolateral defect (scar); non-diagnostic for ischemia; low risk scan    Hyperlipidemia    Hypertension    Left foot drop    r/t past disk srugery - uses Kevlar brace   Myocardial infarction ([place])    posterior MI   Shortness of breath    Sleep apnea    on CPAP; 04/28/2007 split-night - AHI during total sleep 44.43/hr and REM 72.56/hr  Chronic dizziness   Significant Hospital Events: Including procedures, antibiotic start and stop dates in addition to other pertinent events   10/30 admitted cardioverted  Interim History / Subjective:  Consulted  Objective   Blood pressure 126/66, pulse 69, temperature 98.4 F (36.9 C), temperature source Oral, resp. rate (!) 23, height 6' (1.829 m), weight 97.4 kg, SpO2 94 %.    FiO2 (%):  [21 %-24 %] 21 %   Intake/Output Summary (Last 24 hours) at 04/20/2021 1724 Last data filed at 04/20/2021 1717 Gross per 24 hour  Intake 1331.48 ml  Output 2840 ml  Net -1508.52 ml   Filed Weights   04/18/21 0500 04/19/21  0500 04/20/21 0500  Weight: 94.9 kg 97.4 kg 97.4 kg    Examination: General: chronically ill appearing man sitting up in bed HENT: MMM, trachea midline Lungs: clear, no wheezing or rhonci Cardiovascular: sounds regular, ext warm Abdomen: soft, +BS Extremities: mild edema Neuro: moves all 4 ext, globally weak Skin: scattered bruising  Resolved Hospital Problem list   N/A  Assessment & Plan:  Recurrent VT: per EP  Enlarging RUL nodule: concerning for aggressive lung malignancy (see Dr. Juline Patch notes). Plan to bridge with IV antiplatelets starting tomorrow Tentative plan for ENB on Tuesday  DM: insulin and SSI  Allergies: singulair  Ischemic cardiomyopathy: GDMT as ordered  Hemorrhoids: fiber and preparation H  Will follow or can take over as primary  Best Practice (right click and "Reselect all SmartList Selections" daily)   Diet/type: Regular consistency (see orders) DVT prophylaxis: SCD GI prophylaxis: PPI Lines: N/A Foley:  N/A Code Status:  full code Last date of multidisciplinary goals of care discussion [n/a]  Labs   CBC: Recent Labs  Lab 04/17/21 1250  WBC 2.4*  NEUTROABS 1.2*  HGB 17.7*  HCT 55.7*  MCV 94.7  PLT 141*    Basic Metabolic Panel: Recent Labs  Lab 04/17/21 1612 04/18/21 0133 04/18/21 1106 04/18/21 2238 04/19/21 0113 04/20/21 0127  NA  --  139 139 138 138 135  K  --  3.2* 4.3 3.7 3.9 3.9  CL  --  108 110 110 108 106  CO2  --  21* 24 23 23 22   GLUCOSE  --  89 162* 108* 85 103*  BUN  --  13 11 15 14 13   CREATININE  --  1.06 1.01 1.05 1.03 0.94  CALCIUM  --  8.5* 8.4* 8.5* 8.7* 8.5*  MG 2.0  --  2.2 2.1 2.6* 2.2   GFR: Estimated Creatinine Clearance: 85.9 mL/min (by C-G formula based on SCr of 0.94 mg/dL). Recent Labs  Lab 04/17/21 1250  WBC 2.4*    Liver Function Tests: Recent Labs  Lab 04/17/21 1250 04/18/21 2238  AST 23 15  ALT 18 13  ALKPHOS 71 71  BILITOT 1.1 0.5  PROT 5.8* 5.3*  ALBUMIN 3.5 3.0*   No  results for input(s): LIPASE, AMYLASE in the last 168 hours. No results for input(s): AMMONIA in the last 168 hours.  ABG    Component Value Date/Time   PHART 7.404 02/15/2021 2124   PCO2ART 30.2 (L) 02/15/2021 2124   PO2ART 67 (L) 02/15/2021 2124   HCO3 18.9 (L) 02/15/2021 2124   TCO2 20 (L) 02/15/2021 2124   ACIDBASEDEF 5.0 (H) 02/15/2021 2124   O2SAT 94.0 02/15/2021 2124     Coagulation Profile: Recent Labs  Lab 04/17/21 1250  INR 1.1    Cardiac Enzymes: No results for input(s): CKTOTAL, CKMB, CKMBINDEX, TROPONINI in the last 168 hours.  HbA1C: Hgb A1c MFr Bld  Date/Time Value Ref Range Status  04/18/2021 01:33 AM 6.9 (H) 4.8 - 5.6 % Final    Comment:    (NOTE) Pre diabetes:          5.7%-6.4%  Diabetes:              >6.4%  Glycemic control for   <7.0% adults with diabetes   04/17/2021 12:50 PM 6.8 (H) 4.8 - 5.6 % Final    Comment:    (NOTE) Pre diabetes:          5.7%-6.4%  Diabetes:              >6.4%  Glycemic control for   <7.0% adults with diabetes     CBG: Recent Labs  Lab 04/19/21 1227 04/19/21 1624 04/19/21 2207 04/20/21 0653 04/20/21 1117  GLUCAP 133* 111* 184* 151* 146*    Review of Systems:    Positive Symptoms in bold:  Constitutional fevers, chills, weight loss, fatigue, anorexia, malaise  Eyes decreased vision, double vision, eye irritation  Ears, Nose, Mouth, Throat sore throat, trouble swallowing, sinus congestion  Cardiovascular chest pain, paroxysmal nocturnal dyspnea, lower ext edema, palpitations   Respiratory SOB, cough, DOE, hemoptysis, wheezing  Gastrointestinal nausea, vomiting, diarrhea  Genitourinary burning with urination, trouble urinating  Musculoskeletal joint aches, joint swelling, back pain  Integumentary  rashes, skin lesions  Neurological focal weakness, focal numbness, trouble speaking, headaches  Psychiatric depression, anxiety, confusion  Endocrine polyuria, polydipsia, cold intolerance, heat  intolerance  Hematologic abnormal bruising, abnormal bleeding, unexplained nose bleeds  Allergic/Immunologic recurrent infections, hives, swollen lymph nodes     Past Medical History:  He,  has a past medical history of Atrial flutter ([place]), Coronary artery disease, Diabetes mellitus, GERD (gastroesophageal reflux disease), History of nuclear stress test (04/04/2011), Hyperlipidemia, Hypertension, Left foot drop, Myocardial infarction ([place]), Shortness of breath, and Sleep apnea.   Surgical History:   Past Surgical History:  Procedure Laterality Date   AMPUTATION TOE Left 02/10/2021   Procedure:  AMPUTATION  LEFT SECOND TOE;  Surgeon: Edrick Kins, DPM;  Location: [facility];  Service: Podiatry;  Laterality: Left;   [place]  2010   6 stents total   CARDIAC CATHETERIZATION  01/2000   percutaneous transluminal coronary balloon angioplasty of mid RCA stenotic lesion   CARDIAC CATHETERIZATION  06/2006   no stenting; ischemic cardiomyopathy, EF 40-45%   CARDIOVERSION N/A 07/28/2016   Procedure: CARDIOVERSION;  Surgeon: Troy Sine, MD;  Location: [facility];  Service: Cardiovascular;  Laterality: N/A;   CORONARY ANGIOPLASTY  09/1998   mid-distal RCA balloon dilatation, 4.5 & 5.0 stents    CORONARY ANGIOPLASTY WITH STENT PLACEMENT  03/1994   angioplasty & stenting (non-DES) of circumflex/prox ramus intermedius   CORONARY ANGIOPLASTY WITH STENT PLACEMENT  10/1994   large iliac [id number] stent to RCA   [place]  12/2002   4.47mm stents x2 of RCA   CORONARY ANGIOPLASTY WITH STENT PLACEMENT  01/2005   cutting balloon arthrectomy of distal RCA & Cypher DES 3.5x13; cutting balloon arthrectomy of mid RCA with Cypher DES 3.5x18   CORONARY ANGIOPLASTY WITH STENT PLACEMENT  11/2008   stenting of mid RCA with 4.0x39mm driver, non-DES   CORONARY BALLOON ANGIOPLASTY N/A 02/15/2021   Procedure: CORONARY BALLOON ANGIOPLASTY;  Surgeon: Troy Sine, MD;  Location: [facility];  Service: Cardiovascular;  Laterality: N/A;   ICD IMPLANT N/A 08/20/2020   Procedure: ICD IMPLANT;  Surgeon: Constance Haw, MD;  Location: [facility];  Service: Cardiovascular;  Laterality: N/A;   INTRAVASCULAR PRESSURE WIRE/FFR STUDY N/A 03/02/2020   Procedure: INTRAVASCULAR PRESSURE WIRE/FFR STUDY;  Surgeon: Leonie Man, MD;  Location: [facility];  Service: Cardiovascular;  Laterality: N/A;   LEFT HEART CATH AND CORONARY ANGIOGRAPHY N/A 03/02/2020   Procedure: LEFT HEART CATH AND CORONARY ANGIOGRAPHY;  Surgeon: Leonie Man, MD;  Location: [facility];  Service: Cardiovascular;  Laterality: N/A;   LEFT HEART CATH AND CORONARY ANGIOGRAPHY N/A 08/19/2020   Procedure: LEFT HEART CATH AND CORONARY ANGIOGRAPHY;  Surgeon: Lorretta Harp, MD;  Location: [facility];  Service: Cardiovascular;  Laterality: N/A;   LEFT HEART CATH AND CORONARY ANGIOGRAPHY N/A 02/15/2021   Procedure: LEFT HEART CATH AND CORONARY ANGIOGRAPHY;  Surgeon: Troy Sine, MD;  Location: [facility];  Service: Cardiovascular;  Laterality: N/A;   LEFT HEART CATHETERIZATION WITH CORONARY ANGIOGRAM N/A 02/27/2012   Procedure: LEFT HEART CATHETERIZATION WITH CORONARY ANGIOGRAM;  Surgeon: Lorretta Harp, MD;  Location: [facility];  Service: Cardiovascular;  Laterality: N/A;   TRANSTHORACIC ECHOCARDIOGRAM  07/29/2010   EF 50=55%, mod inf wall hypokinesis & mild post wall hypokinesis; LA mild-mod dilated; mild mitral annular calcif & mild MR; mild TR & elevated RV systolic pressure; AV mildly sclerotic; mild aortic root dilatation    V TACH ABLATION N/A 01/20/2021   Procedure: V TACH ABLATION;  Surgeon: Vickie Epley, MD;  Location: [facility];  Service: Cardiovascular;  Laterality: N/A;     Social History:   reports that he quit smoking about 4 years ago. His smoking use included cigarettes. He has a 50.00 pack-year smoking  history. He has never used smokeless tobacco. He reports that he does not currently use alcohol. He reports that he does not use drugs.   Family History:  His family history includes Heart attack in his father.   Allergies  Allergies  Allergen Reactions   Omega-3 Fatty Acids Hives and Itching   Benazepril Other (See Comments)    hyperkalemia   Fish Allergy Itching     Home Medications  Prior to Admission medications   Medication Sig Start Date End Date Taking? Authorizing Provider  albuterol (VENTOLIN HFA) 108 (90 Base) MCG/ACT inhaler Inhale 2 puffs into the lungs every 6 (six) hours as needed for wheezing or shortness of breath. 06/14/20  Yes Icard, Octavio Graves, DO  clopidogrel (PLAVIX) 75 MG tablet Take 1 tablet (75 mg total) by mouth daily with breakfast. 02/19/21  Yes Duke, Tami Lin, PA  ELIQUIS 5 MG TABS tablet TAKE 1 TABLET BY MOUTH TWICE A DAY Patient taking differently: Take 5 mg by mouth 2 (two) times daily. 03/25/21  Yes Camnitz, Will Hassell Done, MD  ENTRESTO 24-26 MG TAKE 1 TABLET BY MOUTH TWICE A DAY Patient taking differently: Take 1 tablet by mouth 2 (two) times daily. 10/08/20  Yes Troy Sine, MD  Fluticasone Furoate (ARNUITY ELLIPTA) 200 MCG/ACT AEPB Inhale 1 puff into the lungs daily. 12/29/20  Yes Icard, Bradley L, DO  glucose blood (ONETOUCH ULTRA) test strip USE TO CHECK BLOOD SUGAR 3 TIMES A DAY (E11.9) Patient taking differently: 1 each by Other route 3 (three) times daily. USE TO CHECK BLOOD SUGAR 3 TIMES A DAY (E11.9) 12/09/20  Yes [name], MD  insulin aspart (NOVOLOG FLEXPEN) 100 UNIT/ML FlexPen INJECT 5-20 UNITS SUBCUTANEOUSLY WITH LUNCH AND DINNER Patient taking differently: Inject 6-8 Units into the skin daily as needed for high blood sugar. 06/16/20  Yes [name], MD  Insulin Pen Needle (NOVOFINE) 32G X 6 MM MISC 1 each by Other route daily. 08/19/19  Yes PickardCammie Mcgee, MD  JARDIANCE 25 MG TABS tablet TAKE 1 TABLET BY MOUTH EVERY  DAY Patient taking differently: Take 25 mg by mouth daily. 11/09/20  Yes Pickard, [name], MD  LEVEMIR FLEXTOUCH 100 UNIT/ML FlexPen INJECT 46 UNITS INTO THE SKIN DAILY. DX:E11.9 Patient taking differently: Inject 46 Units into the skin daily. 01/11/21  Yes [name], MD  mexiletine (MEXITIL) 200 MG capsule Take 200 mg by mouth every 12 (twelve) hours. 03/03/21  Yes [provider]  montelukast (SINGULAIR) 10 MG tablet TAKE 1 TABLET BY MOUTH EVERY DAY Patient taking differently: Take 10 mg by mouth daily. 08/19/20  Yes [name], MD  nitroGLYCERIN (NITROSTAT) 0.4 MG SL tablet PLACE 1 TABLET (0.4 MG TOTAL) UNDER THE TONGUE EVERY 5 (FIVE) MINUTES AS NEEDED FOR CHEST PAIN. 07/27/20  Yes Troy Sine, MD  rosuvastatin (CRESTOR) 20 MG tablet Take 20 mg by mouth daily.   Yes [provider]  sotalol (BETAPACE) 160 MG tablet Take 1 tablet (160 mg total) by mouth 2 (two) times daily. 01/28/21  Yes Baldwin Jamaica, PA-C  mexiletine (MEXITIL) 150 MG capsule Take 2 capsules (300 mg total) by mouth every 12 (twelve) hours. Patient not taking: No sig reported 02/26/21   Ledora Bottcher, PA  rosuvastatin (CRESTOR) 40 MG tablet Take 1 tablet (40 mg total) by mouth daily. Patient not taking: Reported on 04/17/2021 02/18/21   Ledora Bottcher, PA

## 2021-04-20 NOTE — Progress Notes (Addendum)
Electrophysiology Rounding Note  Patient Name: CADENCE HASLAM Date of Encounter: 04/20/2021  Primary Cardiologist: Shelva Majestic, MD Electrophysiologist: Constance Haw, MD   Subjective   Still feeling somewhat weak, and SOB with mild/mod exertion. No further VT since paced out prior to lunch yesterday.  Inpatient Medications    Scheduled Meds:  budesonide  2 mL Inhalation BID   Chlorhexidine Gluconate Cloth  6 each Topical Daily   empagliflozin  25 mg Oral Daily   insulin aspart  0-5 Units Subcutaneous QHS   insulin aspart  0-9 Units Subcutaneous TID WC   insulin detemir  46 Units Subcutaneous Daily   metoprolol tartrate  50 mg Oral BID   montelukast  10 mg Oral Daily   rosuvastatin  40 mg Oral QHS   sacubitril-valsartan  1 tablet Oral BID   spironolactone  25 mg Oral Daily   Continuous Infusions:  sodium chloride     amiodarone 60 mg/hr (04/20/21 0600)   lidocaine 2 mg/min (04/20/21 0600)   PRN Meds: acetaminophen, albuterol, hydrALAZINE, nitroGLYCERIN, ondansetron (ZOFRAN) IV, zolpidem   Vital Signs    Vitals:   04/20/21 0400 04/20/21 0500 04/20/21 0600 04/20/21 0655  BP:  132/84    Pulse: 71 70 73   Resp: (!) 22 (!) 23 (!) 23   Temp:    98.4 F (36.9 C)  TempSrc:    Oral  SpO2: 94%  91%   Weight:  97.4 kg    Height:        Intake/Output Summary (Last 24 hours) at 04/20/2021 0720 Last data filed at 04/20/2021 0600 Gross per 24 hour  Intake 1954.48 ml  Output 1440 ml  Net 514.48 ml   Filed Weights   04/18/21 0500 04/19/21 0500 04/20/21 0500  Weight: 94.9 kg 97.4 kg 97.4 kg    Physical Exam    GEN- The patient is well appearing, alert and oriented x 3 today.   Head- normocephalic, atraumatic Eyes-  Sclera clear, conjunctiva pink Ears- hearing intact Oropharynx- clear Neck- supple Lungs- Clear to ausculation bilaterally, normal work of breathing Heart- Regular rate and rhythm, no murmurs, rubs or gallops GI- soft, NT, ND, +  BS Extremities- no clubbing or cyanosis. No edema Skin- no rash or lesion Psych- euthymic mood, full affect Neuro- strength and sensation are intact  Labs    CBC Recent Labs    04/17/21 1250  WBC 2.4*  NEUTROABS 1.2*  HGB 17.7*  HCT 55.7*  MCV 94.7  PLT 540*   Basic Metabolic Panel Recent Labs    04/19/21 0113 04/20/21 0127  NA 138 135  K 3.9 3.9  CL 108 106  CO2 23 22  GLUCOSE 85 103*  BUN 14 13  CREATININE 1.03 0.94  CALCIUM 8.7* 8.5*  MG 2.6* 2.2   Liver Function Tests Recent Labs    04/17/21 1250 04/18/21 2238  AST 23 15  ALT 18 13  ALKPHOS 71 71  BILITOT 1.1 0.5  PROT 5.8* 5.3*  ALBUMIN 3.5 3.0*   No results for input(s): LIPASE, AMYLASE in the last 72 hours. Cardiac Enzymes No results for input(s): CKTOTAL, CKMB, CKMBINDEX, TROPONINI in the last 72 hours.   Telemetry    A paced 70s (personally reviewed)  Radiology    ECHOCARDIOGRAM LIMITED  Result Date: 04/18/2021    ECHOCARDIOGRAM LIMITED REPORT   Patient Name:   GAREK SCHUNEMAN Date of Exam: 04/18/2021 Medical Rec #:  [id number]        Height:  72.0 in Accession #:    [id number]       Weight:       209.2 lb Date of Birth:  [birth year]        BSA:          2.172 m Patient Age:    70 years         BP:           144/96 mmHg Patient Gender: M                HR:           70 bpm. Exam Location:  Inpatient Procedure: 2D Echo, 3D Echo, Cardiac Doppler and Color Doppler Indications:    I47.2 Ventricular tachycardia  History:        Patient has prior history of Echocardiogram examinations, most                 recent 02/15/2021. CAD, Abnormal ECG, Stroke,                 Signs/Symptoms:Chest Pain; Risk Factors:Current Smoker and                 Diabetes.  Sonographer:    Leary Referring Phys: [id number] [place]  1. Left ventricular ejection fraction, by estimation, is 25 to 30%. Left ventricular ejection fraction by 3D volume is 27 %. The left ventricle has severely decreased  function. The left ventricle demonstrates regional wall motion abnormalities (see scoring diagram/findings for description). The left ventricular internal cavity size was moderately dilated. Left ventricular diastolic parameters are indeterminate. Elevated left ventricular end-diastolic pressure. There is severe hypokinesis of the left ventricular, basal-mid inferoseptal wall and lateral wall. There is akinesis of the left ventricular, basal-mid inferior wall and inferolateral wall.  2. Right ventricular systolic function is normal. The right ventricular size is mildly enlarged. There is normal pulmonary artery systolic pressure. The estimated right ventricular systolic pressure is 67.1 mmHg.  3. The mitral valve is normal in structure. Mild mitral valve regurgitation. No evidence of mitral stenosis.  4. The aortic valve is normal in structure. Aortic valve regurgitation is not visualized. No aortic stenosis is present.  5. Aortic dilatation noted. There is mild dilatation of the aortic root, measuring 38 mm. There is mild dilatation of the ascending aorta, measuring 43 mm.  6. The inferior vena cava is dilated in size with <50% respiratory variability, suggesting right atrial pressure of 15 mmHg.  7. Compared to study dated 02/15/2021, LVF has declined FINDINGS  Left Ventricle: Left ventricular ejection fraction, by estimation, is 25 to 30%. Left ventricular ejection fraction by 3D volume is 27 %. The left ventricle has severely decreased function. The left ventricle demonstrates regional wall motion abnormalities. Severe hypokinesis of the left ventricular, basal-mid inferoseptal wall and lateral wall. The left ventricular internal cavity size was moderately dilated. There is no left ventricular hypertrophy. Left ventricular diastolic parameters are  indeterminate. Elevated left ventricular end-diastolic pressure. Right Ventricle: The right ventricular size is mildly enlarged. No increase in right ventricular wall  thickness. Right ventricular systolic function is normal. There is normal pulmonary artery systolic pressure. The tricuspid regurgitant velocity is 2.17  m/s, and with an assumed right atrial pressure of 15 mmHg, the estimated right ventricular systolic pressure is 24.5 mmHg. Left Atrium: Left atrial size was normal in size. Right Atrium: Right atrial size was normal in size. Pericardium: There is no evidence of pericardial effusion. Mitral Valve:  The mitral valve is normal in structure. Mild mitral valve regurgitation. No evidence of mitral valve stenosis. Tricuspid Valve: The tricuspid valve is normal in structure. Tricuspid valve regurgitation is trivial. No evidence of tricuspid stenosis. Aortic Valve: The aortic valve is normal in structure. Aortic valve regurgitation is not visualized. No aortic stenosis is present. Pulmonic Valve: The pulmonic valve was normal in structure. Pulmonic valve regurgitation is trivial. No evidence of pulmonic stenosis. Aorta: Aortic dilatation noted. There is mild dilatation of the aortic root, measuring 38 mm. There is mild dilatation of the ascending aorta, measuring 43 mm. Venous: The inferior vena cava is dilated in size with less than 50% respiratory variability, suggesting right atrial pressure of 15 mmHg. IAS/Shunts: No atrial level shunt detected by color flow Doppler. LEFT VENTRICLE PLAX 2D LVIDd:         6.30 cm         Diastology LVIDs:         6.10 cm         LV e' medial:    4.58 cm/s LV PW:         1.60 cm         LV E/e' medial:  18.2 LV IVS:        1.10 cm         LV e' lateral:   5.10 cm/s LVOT diam:     2.30 cm         LV E/e' lateral: 16.3 LV SV:         67 LV SV Index:   31 LVOT Area:     4.15 cm        3D Volume EF                                LV 3D EF:    Left                                             ventricul LV Volumes (MOD)                            ar LV vol d, MOD    188.0 ml                   ejection A2C:                                         fraction LV vol d, MOD    188.0 ml                   by 3D A4C:                                        volume is LV vol s, MOD    132.0 ml                   27 %. A2C: LV vol s, MOD    160.0 ml A4C:  3D Volume EF: LV SV MOD A2C:   56.0 ml       3D EF:        27 % LV SV MOD A4C:   188.0 ml      LV EDV:       216 ml LV SV MOD BP:    38.0 ml       LV ESV:       157 ml                                LV SV:        59 ml RIGHT VENTRICLE            IVC RV S prime:     9.30 cm/s  IVC diam: 2.20 cm TAPSE (M-mode): 1.8 cm LEFT ATRIUM             Index        RIGHT ATRIUM           Index LA diam:        4.20 cm 1.93 cm/m   RA Area:     18.40 cm LA Vol (A2C):   72.7 ml 33.48 ml/m  RA Volume:   48.70 ml  22.42 ml/m LA Vol (A4C):   52.4 ml 24.13 ml/m LA Biplane Vol: 64.9 ml 29.88 ml/m  AORTIC VALVE             PULMONIC VALVE LVOT Vmax:   76.40 cm/s  PR End Diast Vel: 1.80 msec LVOT Vmean:  50.500 cm/s LVOT VTI:    0.161 m  AORTA Ao Root diam: 3.80 cm Ao Asc diam:  4.30 cm MITRAL VALVE                  TRICUSPID VALVE MV Area (PHT): 4.06 cm       TR Peak grad:   18.8 mmHg MV Decel Time: 187 msec       TR Vmax:        217.00 cm/s MR Peak grad:    79.6 mmHg MR Mean grad:    51.0 mmHg    SHUNTS MR Vmax:         446.00 cm/s  Systemic VTI:  0.16 m MR Vmean:        336.0 cm/s   Systemic Diam: 2.30 cm MR PISA:         1.01 cm MR PISA Eff ROA: 8 mm MR PISA Radius:  0.40 cm MV E velocity: 83.30 cm/s MV A velocity: 69.80 cm/s MV E/A ratio:  1.19 Fransico Him MD Electronically signed by Fransico Him MD Signature Date/Time: 04/18/2021/11:04:20 AM    Final     Patient Profile     KAZUKI INGLE is a 70 y.o. male with a past medical history significant for CHF, CAD, ILD, and VT s/p ablation.  he was admitted for VT storm.    VT ablation 01/2021 At that time to distinct ventricular tachycardias were noted.  One was a posterolateral VT that was successfully ablated near the papillary muscle.  A second VT  was not targeted for ablation but was described as a left superior axis VT  Assessment & Plan    Recurrent hemodynamically intolerable sustained MMVT EKG of ventricular tachycardia demonstrates a right bundle left superior axis VT with essentially positive precordial concordance.  1 and aVL are positive. Continue to have increased VT overnight Paced out  twice yesterday, once ~115 bpm, and a second time with rates ~ 105-110 bpm Quiescent overnight on IV amio 60 mg/hr and lidocaine 2 mg/hr.  STOP lidocaine, this may be contributing to his weakness. Change back to Mexitil 300 mg BID.  Keep K > 4.0 and Mg > 2.0. K 3.9 and Mg 2.2 this am.  With quiescence on amiodarone, reasonable to also consider medical therapy alone. Dr. Curt Bears to discuss further with patient.    2. Chronic systolic CHF Echo 27-25% -> 20-30% in setting of recurrent VT Continue spiro 25 mg daily Continue Entresto and Jardiance as tolerated. Volume status OK.    3. CAD ACS in 02/15/2021 with PCI requiring PTCA to the RCA Troponin negative/flat this admission.    4. Lung mass Plans in works for biopsy off anti-platelets Plan is to keep admitted and start IV anti-platelets today vs tomorrow based on course. As he is maintaining NSR, he is not felt to need heparin bridge for Eliquis at this time.    5. Goals of Care We have consulted palliative care in the setting of recurrent symptomatic VT and lung mass.  For questions or updates, please contact [place] Please consult [email or web address] for contact info under Cardiology/STEMI.  Signed, Shirley Friar, PA-C  04/20/2021, 7:20 AM   I have seen and examined this patient with [name].  Agree with above, note added to reflect my findings.  On exam, RRR, no murmurs.  Has had no further ventricular tachycardia since yesterday afternoon.  Currently on amiodarone and lidocaine.  We Essie Gehret stop the lidocaine today and start him on p.o. mexiletine 300 mg twice daily.   He is on high-dose IV amiodarone currently.  We Breydan Shillingburg reduce the dose later this afternoon to 30 mg/h.  We have stopped all his antiplatelets.  We Krystalyn Kubota start IV antiplatelets today based on pharmacy recommendations.  Kelsha Older M. Winni Ehrhard MD 04/20/2021 1:21 PM

## 2021-04-21 ENCOUNTER — Ambulatory Visit: Payer: Medicare Other | Admitting: Cardiovascular Disease

## 2021-04-21 DIAGNOSIS — Z515 Encounter for palliative care: Secondary | ICD-10-CM | POA: Diagnosis not present

## 2021-04-21 DIAGNOSIS — I472 Ventricular tachycardia, unspecified: Secondary | ICD-10-CM | POA: Diagnosis not present

## 2021-04-21 DIAGNOSIS — R911 Solitary pulmonary nodule: Secondary | ICD-10-CM | POA: Diagnosis not present

## 2021-04-21 DIAGNOSIS — R918 Other nonspecific abnormal finding of lung field: Secondary | ICD-10-CM | POA: Diagnosis not present

## 2021-04-21 DIAGNOSIS — Z7189 Other specified counseling: Secondary | ICD-10-CM

## 2021-04-21 LAB — BASIC METABOLIC PANEL
Anion gap: 7 (ref 5–15)
BUN: 16 mg/dL (ref 8–23)
CO2: 25 mmol/L (ref 22–32)
Calcium: 8.8 mg/dL — ABNORMAL LOW (ref 8.9–10.3)
Chloride: 108 mmol/L (ref 98–111)
Creatinine, Ser: 1.06 mg/dL (ref 0.61–1.24)
GFR, Estimated: >60 mL/min (ref >60)
Glucose, Bld: 104 mg/dL — ABNORMAL HIGH (ref 70–99)
Potassium: 3.9 mmol/L (ref 3.5–5.1)
Sodium: 140 mmol/L (ref 135–145)

## 2021-04-21 LAB — GLUCOSE, CAPILLARY
Glucose-Capillary: 192 mg/dL — ABNORMAL HIGH (ref 70–99)
Glucose-Capillary: 240 mg/dL — ABNORMAL HIGH (ref 70–99)
Glucose-Capillary: 120 mg/dL — ABNORMAL HIGH (ref 70–99)
Glucose-Capillary: 157 mg/dL — ABNORMAL HIGH (ref 70–99)

## 2021-04-21 LAB — MAGNESIUM: Magnesium: 2.2 mg/dL (ref 1.7–2.4)

## 2021-04-21 MED ORDER — FUROSEMIDE 10 MG/ML IJ SOLN
40.0000 mg | Freq: Once | INTRAMUSCULAR | Status: AC
  Administered 2021-04-21: 40 mg via INTRAVENOUS
  Filled 2021-04-21: qty 4

## 2021-04-21 MED ORDER — POTASSIUM CHLORIDE CRYS ER 20 MEQ PO TBCR
40.0000 meq | EXTENDED_RELEASE_TABLET | Freq: Once | ORAL | Status: AC
  Administered 2021-04-21: 40 meq via ORAL
  Filled 2021-04-21: qty 2

## 2021-04-21 NOTE — Consult Note (Signed)
Consultation Note Date: 04/21/2021   Patient Name: John [name]  DOB: [birth year]  MRN: [id number]  Age / Sex: 70 y.o., male  PCP: John Frizzle, MD Referring Physician: Candee Furbish, MD  Reason for Consultation: Establishing goals of care  HPI/Patient Profile: 70 y.o. male  with past medical history of CAD, multiple PCI, VT s/p ablation, ICD, a flutter s/p cardioversion, HTN, HLD, CVA, T2DM, and GERD admitted on 04/17/2021 with V tach requiring cardioversion in ER. Now improved with medical management. Remains on IV amio with plans to transition to PO 11/4. Patient also has rapidly enlarging RUL nodule  - was planned for biopsy 11/3 but now delayed to next [place] cardiac issues.  PMT consulted to discuss [place].   Clinical Assessment and Goals of Care: I have reviewed medical records including EPIC notes, labs and imaging, received report from RN, assessed the patient and then met with patient  to discuss diagnosis prognosis, GOC, EOL wishes, disposition and options.  I introduced Palliative Medicine as specialized medical care for people living with serious illness. It focuses on providing relief from the symptoms and stress of a serious illness. The goal is to improve quality of life for both the patient and the family.  We discussed a brief life review of the patient. Patient tells me about his family - his wife John [name], children, and grandchildren. Tells me about how much he enjoys going on motorcycle rides with his 38 yo granddaughter but unfortunately, has not been able to do this since April of this year. He sold his motorcycle.   He shares more about his emotions during this challenging time. Tells me he is more concerned about putting his family through emotional turmoil than he is concerned about his own health. He also shares that while he would like to be alive longer; he does not fear death. He tells me he had been preparing for lung  biopsy for some time and had made arrangements/called people he needed to in preparation for serious medical care.   As far as functional and nutritional status, John [name] tells me he has declined since April. He tells me he can still complete ADLs independently (with cane) but is often fatigued and short of breath. He tells me he felt like he could sleep 24 hrs/day. He also shares about severe weakness. He tells me this is much improved - stating that today he feels the best he has felt since April. He tells me appetite has been okay throughout.    We discussed patient's current illness and what it means in the larger context of patient's on-going co-morbidities.  Natural disease trajectory and expectations at EOL were discussed. We discussed ongoing cardiac issues and his lung mass. At this point, John [name] feels good about treatment for his heart condition. He also shares he would be open to ablation in the future if issues arose again. We discuss his planned lung biopsy - he tells me he has been preparing for this for a while as he has known about the nodule for 3 years. He is most concerned about the biopsy procedure itself. I ask him to consider how he feels about the results of the biopsy. He tells me having the information will be helpful to him. He is unsure if he would pursue treatment or be a candidate for treatment but would like to proceed with biopsy just to have the information.   I attempted to elicit values and goals of care important to the  patient.    The difference between aggressive medical intervention and comfort care was considered in light of the patient's goals of care.   At this point, John [name] is open to medical workup and procedures offered to prolong life however quality of life is also very important to him so he will continue to consider when pursuing aggressive medical work up does not grant him the quality of life he is hopeful for.   We briefly discuss his code status - he tells  me he has had this conversation with other providers. He shares for now he would like to remain full code as he would like to give his wife John [name] time to get to the hospital to see him. He shares that John [name] would be his medical decision maker if he were unable to speak for himself. He also tells me that he and John [name] have discussed his wishes if he were very ill and he feels John [name] knows what decisions to make for him that would honor him.   Discussed with patient the importance of continued conversation with family and the medical providers regarding overall plan of care and treatment options, ensuring decisions are within the context of the patient's values and GOCs.    Palliative Care services outpatient were explained and offered. John [name] is very interested in outpatient palliative services at discharge.  Spoke with John [name] via phone per [employer] request. We review the above conversation. Reviewed medical information such as John [name]'s labs and imaging. Reviewed how John [name] is feeling today. John [name] is very grateful for information. John [name] confirms that she and John [name] have discuss his medical wishes and she feels confident in her ability to make decisions for John [name] that he would make for himself if she were in that position. She know [name] would never want to be on a ventilator long-term. John [name] wonders if pursuing the lung biopsy is the right decision as she feels that John [name] will not be a candidate for any treatment options d/t his heart conditions. We discuss uncertainty of this and discuss that [name] may find comfort in having the information.  Ultimately, John [name] agrees to whatever [name] wants to pursue - she supports his wishes. She tells me dignity and quality of life are very important to John [name] and will be as they make decisions regarding his healthcare moving forward. I reviewed outpatient palliative services with John [name] and she agrees as well.   Questions and concerns were addressed. The family was encouraged to  call with questions or concerns.   Primary Decision Maker PATIENT  Spoues John [name] if patient unable  SUMMARY OF RECOMMENDATIONS   - initial goals of care conversation complete with patient and wife - at this point, John [name] is open to all offered medical procedures to prolong life however he does share that quality of life is important to him and he may change his mind if he feels as though he is no longer having good quality of life as a result of medical procedures - for now, he feels as though he is improving and benefiting from medical treatment - to remain full code - would not want long term vent support - John [name] would like to proceed with lung biopsy - maybe only to have the comfort of having answers - unsure if he would pursue or be a candidate for treatment options - wife is surrogate decision maker and has good understanding of John [name]'s wishes - John [name] and wife John [name] agreeable to outpatient palliative follow up - PMT will shadow chart and  reengage as appropriate  Code Status/Advance Care Planning: Full code      Primary Diagnoses: Present on Admission:  VT (ventricular tachycardia)   I have reviewed the medical record, interviewed the patient and family, and examined the patient. The following aspects are pertinent.  Past Medical History:  Diagnosis Date   Atrial flutter ([place])    s/p cardioversion   Coronary artery disease    Diabetes mellitus    GERD (gastroesophageal reflux disease)    History of nuclear stress test 04/04/2011   lexiscan; mod-large in size fixed inferolateral defect (scar); non-diagnostic for ischemia; low risk scan    Hyperlipidemia    Hypertension    Left foot drop    r/t past disk srugery - uses Kevlar brace   Myocardial infarction (HCC)    posterior MI   Shortness of breath    Sleep apnea    on CPAP; 04/28/2007 split-night - AHI during total sleep 44.43/hr and REM 72.56/hr   Social History   Socioeconomic History   Marital status: Married     Spouse name: Not on file   Number of children: 3   Years of education: Not on file   Highest education level: Not on file  Occupational History   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 [place]  Tobacco Use   Smoking status: Former    Packs/day: 1.00    Years: 50.00    Pack years: 50.00    Types: Cigarettes    Quit date: 07/20/2016    Years since quitting: 4.7   Smokeless tobacco: Never  Vaping Use   Vaping Use: Never used  Substance and Sexual Activity   Alcohol use: Not Currently    Alcohol/week: 0.0 standard drinks   Drug use: No   Sexual activity: Yes  Other Topics Concern   Not on file  Social History Narrative   Not on file   Social Determinants of Health   Financial Resource Strain: Not on file  Food Insecurity: Not on file  Transportation Needs: Not on file  Physical Activity: Not on file  Stress: Not on file  Social Connections: Not on file   Family History  Problem Relation Age of Onset   Heart attack Father    Scheduled Meds:  budesonide  2 mL Inhalation BID   Chlorhexidine Gluconate Cloth  6 each Topical Daily   docusate sodium  100 mg Oral BID   empagliflozin  25 mg Oral Daily   furosemide  40 mg Intravenous Once   insulin aspart  0-5 Units Subcutaneous QHS   insulin aspart  0-9 Units Subcutaneous TID WC   insulin detemir  46 Units Subcutaneous Daily   metoprolol tartrate  50 mg Oral BID   mexiletine  300 mg Oral Q12H   montelukast  10 mg Oral Daily   polycarbophil  625 mg Oral Daily   potassium chloride  40 mEq Oral Once   rosuvastatin  40 mg Oral QHS   sacubitril-valsartan  1 tablet Oral BID   spironolactone  25 mg Oral Daily   Continuous Infusions:  sodium chloride     amiodarone 30 mg/hr (04/21/21 0700)   tirofiban 0.15 mcg/kg/min (04/21/21 0859)   PRN Meds:.acetaminophen, albuterol, hydrALAZINE, hydrocortisone cream, nitroGLYCERIN, ondansetron (ZOFRAN) IV, zolpidem Allergies  Allergen Reactions    Omega-3 Fatty Acids Hives and Itching   Benazepril Other (See Comments)    hyperkalemia   Fish Allergy Itching   Review  of Systems  Constitutional:  Negative for activity change, appetite change and fatigue.  Respiratory:  Negative for shortness of breath.   Cardiovascular:  Negative for chest pain.  Gastrointestinal:  Positive for constipation.       Resolving   Physical Exam Constitutional:      General: He is not in acute distress. Pulmonary:     Effort: Pulmonary effort is normal. No respiratory distress.  Skin:    General: Skin is warm and dry.  Neurological:     Mental Status: He is alert and oriented to person, place, and time.  Psychiatric:        Mood and Affect: Mood normal.        Behavior: Behavior normal.        Thought Content: Thought content normal.        Judgment: Judgment normal.    Vital Signs: BP (!) 140/108   Pulse (!) 162   Temp 98.5 F (36.9 C) (Oral)   Resp (!) 25   Ht 6' (1.829 m)   Wt 86.4 kg   SpO2 91%   BMI 25.83 kg/m  Pain Scale: 0-10   Pain Score: Asleep   SpO2: SpO2: 91 % O2 Device:SpO2: 91 % O2 Flow Rate: .O2 Flow Rate (L/min): 2 L/min  IO: Intake/output summary:  Intake/Output Summary (Last 24 hours) at 04/21/2021 0915 Last data filed at 04/21/2021 0740 Gross per 24 hour  Intake 1415.18 ml  Output 3100 ml  Net -1684.82 ml    LBM:   Baseline Weight: Weight: 97.5 kg Most recent weight: Weight: 86.4 kg     Palliative Assessment/Data: PPS 60%    Time Total: 90 minutes Greater than 50%  of this time was spent counseling and coordinating care related to the above assessment and plan.  Juel Burrow, DNP, AGNP-C Palliative Medicine Team [phone] Pager: [phone]

## 2021-04-21 NOTE — Progress Notes (Signed)
Zacarias Pontes [id number] [facility]) Hospital Liaison note:  Notified by Kathie Rhodes, NP of request for [place] services. Will continue to follow for disposition.  Please call with any outpatient palliative questions or concerns.  Thank you for the opportunity to participate in this patient's care.  Thank you, Lorelee Market, LPN [facility] Liaison [phone]

## 2021-04-21 NOTE — Progress Notes (Addendum)
Electrophysiology Rounding Note  Patient Name: John [name] Date of Encounter: 04/21/2021  Primary Cardiologist: John Majestic, MD Electrophysiologist: John Gauthreaux Meredith Leeds, MD   Subjective   The patient is doing well today.   Notices that his O2 drops and his BP rises at times. Feels better on O2. Hasn't been wearing home CPAP  Inpatient Medications    Scheduled Meds:  budesonide  2 mL Inhalation BID   Chlorhexidine Gluconate Cloth  6 each Topical Daily   docusate sodium  100 mg Oral BID   empagliflozin  25 mg Oral Daily   furosemide  40 mg Intravenous Once   insulin aspart  0-5 Units Subcutaneous QHS   insulin aspart  0-9 Units Subcutaneous TID WC   insulin detemir  46 Units Subcutaneous Daily   metoprolol tartrate  50 mg Oral BID   mexiletine  300 mg Oral Q12H   montelukast  10 mg Oral Daily   polycarbophil  625 mg Oral Daily   potassium chloride  40 mEq Oral Once   rosuvastatin  40 mg Oral QHS   sacubitril-valsartan  1 tablet Oral BID   spironolactone  25 mg Oral Daily   Continuous Infusions:  sodium chloride     amiodarone 30 mg/hr (04/21/21 0700)   tirofiban 0.15 mcg/kg/min (04/21/21 0700)   PRN Meds: acetaminophen, albuterol, hydrALAZINE, hydrocortisone cream, nitroGLYCERIN, ondansetron (ZOFRAN) IV, zolpidem   Vital Signs    Vitals:   04/21/21 0600 04/21/21 0700 04/21/21 0737 04/21/21 0800  BP: 132/89 (!) 140/108    Pulse: 70 (!) 162    Resp: (!) 23 (!) 25    Temp:    98.5 F (36.9 C)  TempSrc:    Oral  SpO2: 91% 92% 91%   Weight:      Height:        Intake/Output Summary (Last 24 hours) at 04/21/2021 0828 Last data filed at 04/21/2021 0740 Gross per 24 hour  Intake 1604.66 ml  Output 3300 ml  Net -1695.34 ml   Filed Weights   04/19/21 0500 04/20/21 0500 04/21/21 0500  Weight: 97.4 kg 97.4 kg 86.4 kg    Physical Exam    GEN- The patient is well appearing, alert and oriented x 3 today.   Head- normocephalic, atraumatic Eyes-  Sclera  clear, conjunctiva pink Ears- hearing intact Oropharynx- clear Neck- supple Lungs- Clear to ausculation bilaterally, normal work of breathing Heart- Regular rate and rhythm, no murmurs, rubs or gallops GI- soft, NT, ND, + BS Extremities- no clubbing or cyanosis. No edema Skin- no rash or lesion Psych- euthymic mood, full affect Neuro- strength and sensation are intact  Labs    CBC Recent Labs    04/20/21 1853  WBC 8.3  HGB 17.8*  HCT 55.0*  MCV 91.8  PLT 811   Basic Metabolic Panel Recent Labs    04/20/21 0127 04/21/21 0248  NA 135 140  K 3.9 3.9  CL 106 108  CO2 22 25  GLUCOSE 103* 104*  BUN 13 16  CREATININE 0.94 1.06  CALCIUM 8.5* 8.8*  MG 2.2 2.2   Liver Function Tests Recent Labs    04/18/21 2238  AST 15  ALT 13  ALKPHOS 71  BILITOT 0.5  PROT 5.3*  ALBUMIN 3.0*   No results for input(s): LIPASE, AMYLASE in the last 72 hours. Cardiac Enzymes No results for input(s): CKTOTAL, CKMB, CKMBINDEX, TROPONINI in the last 72 hours.   Telemetry    NSR 70s (personally reviewed)  Radiology  No results found.  Patient Profile     John [name] is a 70 y.o. male with a past medical history significant for CHF, CAD, ILD, and VT s/p ablation.  he was admitted for VT storm.    VT ablation 01/2021 At that time to distinct ventricular tachycardias were noted.  One was a posterolateral VT that was successfully ablated near the papillary muscle.  A second VT was not targeted for ablation but was described as a left superior axis VT  Assessment & Plan    Recurrent hemodynamically intolerable sustained MMVT EKG of ventricular tachycardia demonstrates a right bundle left superior axis VT with essentially positive precordial concordance.  1 and aVL are positive. VT is quiescent on IV aimodarone 30 mg/hr. Continue today. Po tomorrow if remains stable.  Continue Mexitil 300 mg BID.  Keep K > 4.0 and Mg > 2.0. K 3.9 and Mg 2.2 this am. Ablation deferred with  quiescence on amiodarone.   2. Chronic systolic CHF Echo 23-30% -> 20-30% in setting of recurrent VT Continue spiro 25 mg daily Continue Entresto and Jardiance as tolerated. Give 1 more dose IV lasix today.    3. CAD ACS in 02/15/2021 with PCI requiring PTCA to the RCA Troponin negative/flat this admission.    4. Lung mass Plans in works for biopsy off anti-platelets Plan is to keep admitted and on IV anti-platelets through next week.  CCM now attending.  As he is maintaining NSR, he is not felt to need heparin bridge for Eliquis at this time.    5. Goals of Care We have consulted palliative care in the setting of recurrent symptomatic VT and lung mass.  For questions or updates, please contact John [name] Please consult [email or web address] for contact info under Cardiology/STEMI.  Signed, John Friar, PA-C  04/21/2021, 8:28 AM   I have seen and examined this patient with John [name].  Agree with above, note added to reflect my findings.  On exam, RRR, no murmurs. Remains in sinus rhythm. John [name] plan to transition to PO amiodarone tomorrow and likely move out of ICU tomorrow. Remains on aggrastat in prep for lung biopsy next Tuesday. Wlll need full load of plavix after biopsy when ready to come off of IV antiplatelets.  John Adebayo M. Jahzaria Vary MD 04/21/2021 1:49 PM

## 2021-04-21 NOTE — Consult Note (Signed)
NAME:  John [name], MRN:  [id number], DOB:  Oct 10, 1950, LOS: 4 ADMISSION DATE:  04/17/2021, CONSULTATION DATE:  04/20/21 REFERRING MD:  Lovena Le, CHIEF COMPLAINT:  palpitations    History of Present Illness:  70 year old man with complex ischemic heart history and recurrent Vtach who presented for sustained VT requiring cardioversion in ER.  He has been on EP service for management of this but now is improved.  His current complaints include hemorrhoids and constipation.  He has a rapidly enlarging RUL nodule that there was plan to biopsy on Tuesday with Dr. Valeta Harms.  We are going to have to bridge him with IV antiplatelet therapy due to recent DES.    Pertinent  Medical History   Past Medical History:  Diagnosis Date   Atrial flutter ([place])    s/p cardioversion   Coronary artery disease    Diabetes mellitus    GERD (gastroesophageal reflux disease)    History of nuclear stress test 04/04/2011   lexiscan; mod-large in size fixed inferolateral defect (scar); non-diagnostic for ischemia; low risk scan    Hyperlipidemia    Hypertension    Left foot drop    r/t past disk srugery - uses Kevlar brace   Myocardial infarction ([place])    posterior MI   Shortness of breath    Sleep apnea    on CPAP; 04/28/2007 split-night - AHI during total sleep 44.43/hr and REM 72.56/hr  Chronic dizziness   Significant Hospital Events: Including procedures, antibiotic start and stop dates in addition to other pertinent events   10/30 admitted cardioverted  Interim History / Subjective:  No chest pain.  No further arrhythmias.  Denies shortness of breath  Objective   Blood pressure (!) 142/95, pulse 75, temperature 98.3 F (36.8 C), temperature source Oral, resp. rate 20, height 6' (1.829 m), weight 86.4 kg, SpO2 92 %.    FiO2 (%):  [21 %] 21 %   Intake/Output Summary (Last 24 hours) at 04/21/2021 1703 Last data filed at 04/21/2021 1600 Gross per 24 hour  Intake 1338.14 ml  Output 3900 ml  Net  -2561.86 ml    Filed Weights   04/19/21 0500 04/20/21 0500 04/21/21 0500  Weight: 97.4 kg 97.4 kg 86.4 kg    Examination: General: chronically ill appearing man sitting up in bed HENT: MMM, trachea midline Lungs: clear, no wheezing or rhonci Cardiovascular: sounds regular, ext warm Abdomen: soft, +BS Extremities: mild edema Neuro: moves all 4 ext, globally weak Skin: scattered bruising  Resolved Hospital Problem list   N/A  Assessment & Plan:  Recurrent VT: per EP remains on IV amiodarone Enlarging RUL nodule: concerning for aggressive lung malignancy (see Dr. Juline Patch notes). Plan to bridge with IV antiplatelets starting tomorrow Tentative plan for ENB on Tuesday DM: insulin and SSI Allergies: singulair Ischemic cardiomyopathy: GDMT as ordered Hemorrhoids: fiber and preparation H    Best Practice (right click and "Reselect all SmartList Selections" daily)   Diet/type: Regular consistency (see orders) DVT prophylaxis: SCD GI prophylaxis: PPI Lines: N/A Foley:  N/A Code Status:  full code Last date of multidisciplinary goals of care discussion [n/a]  Labs   CBC: Recent Labs  Lab 04/17/21 1250 04/20/21 1853  WBC 2.4* 8.3  NEUTROABS 1.2*  --   HGB 17.7* 17.8*  HCT 55.7* 55.0*  MCV 94.7 91.8  PLT 141* 174     Basic Metabolic Panel: Recent Labs  Lab 04/18/21 1106 04/18/21 2238 04/19/21 0113 04/20/21 0127 04/21/21 0248  NA 139  138 138 135 140  K 4.3 3.7 3.9 3.9 3.9  CL 110 110 108 106 108  CO2 24 23 23 22 25   GLUCOSE 162* 108* 85 103* 104*  BUN 11 15 14 13 16   CREATININE 1.01 1.05 1.03 0.94 1.06  CALCIUM 8.4* 8.5* 8.7* 8.5* 8.8*  MG 2.2 2.1 2.6* 2.2 2.2    GFR: Estimated Creatinine Clearance: 69.1 mL/min (by C-G formula based on SCr of 1.06 mg/dL). Recent Labs  Lab 04/17/21 1250 04/20/21 1853  WBC 2.4* 8.3     Liver Function Tests: Recent Labs  Lab 04/17/21 1250 04/18/21 2238  AST 23 15  ALT 18 13  ALKPHOS 71 71  BILITOT 1.1 0.5   PROT 5.8* 5.3*  ALBUMIN 3.5 3.0*    No results for input(s): LIPASE, AMYLASE in the last 168 hours. No results for input(s): AMMONIA in the last 168 hours.  ABG    Component Value Date/Time   PHART 7.404 02/15/2021 2124   PCO2ART 30.2 (L) 02/15/2021 2124   PO2ART 67 (L) 02/15/2021 2124   HCO3 18.9 (L) 02/15/2021 2124   TCO2 20 (L) 02/15/2021 2124   ACIDBASEDEF 5.0 (H) 02/15/2021 2124   O2SAT 94.0 02/15/2021 2124      Coagulation Profile: Recent Labs  Lab 04/17/21 1250  INR 1.1     Cardiac Enzymes: No results for input(s): CKTOTAL, CKMB, CKMBINDEX, TROPONINI in the last 168 hours.  HbA1C: Hgb A1c MFr Bld  Date/Time Value Ref Range Status  04/18/2021 01:33 AM 6.9 (H) 4.8 - 5.6 % Final    Comment:    (NOTE) Pre diabetes:          5.7%-6.4%  Diabetes:              >6.4%  Glycemic control for   <7.0% adults with diabetes   04/17/2021 12:50 PM 6.8 (H) 4.8 - 5.6 % Final    Comment:    (NOTE) Pre diabetes:          5.7%-6.4%  Diabetes:              >6.4%  Glycemic control for   <7.0% adults with diabetes     CBG:  Kipp Brood, MD [facility] ICU Physician [place]  Pager: [phone] [place] After hours: [phone].  04/21/2021, 5:04 PM

## 2021-04-21 NOTE — Progress Notes (Signed)
Asked PT about cpap he states he does not use one.

## 2021-04-22 ENCOUNTER — Other Ambulatory Visit: Payer: Self-pay | Admitting: Cardiovascular Disease

## 2021-04-22 DIAGNOSIS — R911 Solitary pulmonary nodule: Secondary | ICD-10-CM | POA: Diagnosis not present

## 2021-04-22 DIAGNOSIS — I472 Ventricular tachycardia, unspecified: Secondary | ICD-10-CM | POA: Diagnosis not present

## 2021-04-22 LAB — MAGNESIUM: Magnesium: 2.3 mg/dL (ref 1.7–2.4)

## 2021-04-22 LAB — CBC
HCT: 56 % — ABNORMAL HIGH (ref 39.0–52.0)
Hemoglobin: 17.8 g/dL — ABNORMAL HIGH (ref 13.0–17.0)
MCH: 29.6 pg (ref 26.0–34.0)
MCHC: 31.8 g/dL (ref 30.0–36.0)
MCV: 93 fL (ref 80.0–100.0)
Platelets: 168 10*3/uL (ref 150–400)
RBC: 6.02 MIL/uL — ABNORMAL HIGH (ref 4.22–5.81)
RDW: 13.9 % (ref 11.5–15.5)
WBC: 8 10*3/uL (ref 4.0–10.5)
nRBC: 0 % (ref 0.0–0.2)

## 2021-04-22 LAB — GLUCOSE, CAPILLARY
Glucose-Capillary: 113 mg/dL — ABNORMAL HIGH (ref 70–99)
Glucose-Capillary: 93 mg/dL (ref 70–99)
Glucose-Capillary: 211 mg/dL — ABNORMAL HIGH (ref 70–99)
Glucose-Capillary: 100 mg/dL — ABNORMAL HIGH (ref 70–99)
Glucose-Capillary: 164 mg/dL — ABNORMAL HIGH (ref 70–99)

## 2021-04-22 LAB — BASIC METABOLIC PANEL
Anion gap: 5 (ref 5–15)
BUN: 21 mg/dL (ref 8–23)
CO2: 31 mmol/L (ref 22–32)
Calcium: 9.4 mg/dL (ref 8.9–10.3)
Chloride: 104 mmol/L (ref 98–111)
Creatinine, Ser: 1.38 mg/dL — ABNORMAL HIGH (ref 0.61–1.24)
GFR, Estimated: 54 mL/min — ABNORMAL LOW (ref >60)
Glucose, Bld: 85 mg/dL (ref 70–99)
Potassium: 4.3 mmol/L (ref 3.5–5.1)
Sodium: 140 mmol/L (ref 135–145)

## 2021-04-22 MED ORDER — AMIODARONE HCL 200 MG PO TABS
400.0000 mg | ORAL_TABLET | Freq: Two times a day (BID) | ORAL | Status: DC
  Administered 2021-04-22 – 2021-04-24 (×5): 400 mg via ORAL
  Filled 2021-04-22 (×5): qty 2

## 2021-04-22 NOTE — TOC Initial Note (Signed)
Transition of Care [facility]) - Initial/Assessment Note    Patient Details  Name: John [name] MRN: [id number] Date of Birth: 01/24/1951  [facility] CM/SW Contact:    Bethena Roys, RN Phone Number: 04/22/2021, 12:11 PM  Clinical Narrative:  Risk for readmission assessment completed. Prior to arrival patient states he was independent from home with spouse and daughter. Patient states he has a cane and rolling walker in the home. Patient states he was driving prior to hospitalization, gets medications without any issues, and goes to his appointments. Case Manager will continue to follow for additional needs.      Expected Discharge Plan: [place] Barriers to Discharge: Continued Medical Work up   Patient Goals and CMS Choice Patient states their goals for this hospitalization and ongoing recovery are:: to return home with spouse and daughter      Expected Discharge Plan and Services Expected Discharge Plan: [place]   Discharge Planning Services: CM Consult   Living arrangements for the past 2 months: Single Family Home                   Prior Living Arrangements/Services Living arrangements for the past 2 months: Single Family Home Lives with:: Spouse, Adult Children Patient language and need for interpreter reviewed:: Yes Do you feel safe going back to the place where you live?: Yes      Need for Family Participation in Patient Care: Yes (Comment) Care giver support system in place?: Yes (comment) Current home services: DME (Patient has cane and rolling walker in the home.) Criminal Activity/Legal Involvement Pertinent to Current Situation/Hospitalization: No - Comment as needed  Activities of Daily Living Home Assistive Devices/Equipment: Cane (specify quad or straight), Walker (specify type) ADL Screening (condition at time of admission) Patient's cognitive ability adequate to safely complete daily  activities?: Yes Is the patient deaf or have difficulty hearing?: No Does the patient have difficulty seeing, even when wearing glasses/contacts?: No Does the patient have difficulty concentrating, remembering, or making decisions?: No Patient able to express need for assistance with ADLs?: Yes Does the patient have difficulty dressing or bathing?: No Independently performs ADLs?: Yes (appropriate for developmental age) Does the patient have difficulty walking or climbing stairs?: No Weakness of Legs: None Weakness of Arms/Hands: None  Permission Sought/Granted Permission sought to share information with : Case Manager, Family Supports                Emotional Assessment Appearance:: Appears stated age Attitude/Demeanor/Rapport: Engaged Affect (typically observed): Appropriate Orientation: : Oriented to Situation, Oriented to  Time, Oriented to Place, Oriented to Self Alcohol / Substance Use: Not Applicable Psych Involvement: No (comment)  Admission diagnosis:  VT (ventricular tachycardia) [I47.20] Patient Active Problem List   Diagnosis Date Noted   Embolic stroke ([place]) 50/53/9767   ACS (acute coronary syndrome) ([place])    Left-sided neglect    Acute focal neurological deficit    NSTEMI (non-ST elevated myocardial infarction) ([place]) 02/14/2021   Diabetic infection of left foot ([place]) 34/19/3790   Chronic systolic CHF (congestive heart failure) ([place]) 02/08/2021   History of ventricular tachycardia 02/08/2021   VT (ventricular tachycardia) 01/26/2021   Coagulation defect ([place]) 12/22/2020   Non-ST elevation (NSTEMI) myocardial infarction [facility])    Ventricular tachycardia 08/18/2020   Capsulitis 05/21/2020   Elevated troponin    Hyperglycemia due to diabetes mellitus ([place]) 02/29/2020   Thoracic aortic aneurysm 02/29/2020   Hypotensive episode 02/29/2020  Obesity (BMI 30.0-34.9) 02/29/2020   Lung nodule 02/19/2020   Former smoker 02/19/2020   Healthcare maintenance  02/19/2020   Acoustic neuroma ([place]) 09/11/2017   Asymmetrical sensorineural hearing loss 09/11/2017   Encounter for cardioversion procedure    Anticoagulation adequate 06/06/2016   Fatigue 10/27/2015   Bradycardia 10/27/2015   Hyperlipidemia LDL goal <70 05/20/2015   OSA on CPAP 07/23/2013   Excessive daytime sleepiness 07/23/2013   Hypertension    Hyperlipidemia    Chest pain 08/18/2012   Tobacco abuse 08/18/2012   Unstable angina, cath [facility] 02/27/12 02/26/2012   CAD, multiple prior RCA PCI's. Last cath 2010, Myoview low risk Oct 2012 02/26/2012   DM type 2, goal HbA1c < 7% (HCC) 02/26/2012   HTN (hypertension) 02/26/2012   Dyslipidemia, low HDL. Intol to fish oil and Noacin 02/26/2012   Sleep apnea, on C-pap 02/26/2012   COPD (chronic obstructive pulmonary disease) ([place]) 02/26/2012   Smoking, quit one week ago 02/26/2012   GERD (gastroesophageal reflux disease) 02/26/2012   PCP:  [name], MD Pharmacy:   [id number] - [place], [address] [place] [address] Phone: [phone] Fax: [phone]   Readmission Risk Interventions Readmission Risk Prevention Plan 04/22/2021 02/25/2021 02/18/2021  Transportation Screening Complete Complete Complete  PCP or Specialist Appt within 3-5 Days Complete Complete -  HRI or Home Care Consult Complete Complete -  Social Work Consult for [place] Planning/Counseling Complete Complete -  Palliative Care Screening Not Applicable Not Applicable -  Medication Review Press photographer) Complete Complete Complete  PCP or Specialist appointment within 3-5 days of discharge - - Complete  HRI or [place] - - Complete  SW Recovery Care/Counseling Consult - - Complete  [place] - - Not Applicable  Some recent data might be hidden

## 2021-04-22 NOTE — Progress Notes (Signed)
Electrophysiology Rounding Note  Patient Name: John [name] Date of Encounter: 04/22/2021  Primary Cardiologist: Shelva Majestic, MD Electrophysiologist: Constance Haw, MD   Subjective   Currently feeling well.  No further ventricular tachycardia overnight.  Respiratory status is much improved.  Net -2.7 L with mild increase in creatinine.  Inpatient Medications    Scheduled Meds:  budesonide  2 mL Inhalation BID   Chlorhexidine Gluconate Cloth  6 each Topical Daily   docusate sodium  100 mg Oral BID   empagliflozin  25 mg Oral Daily   insulin aspart  0-5 Units Subcutaneous QHS   insulin aspart  0-9 Units Subcutaneous TID WC   insulin detemir  46 Units Subcutaneous Daily   metoprolol tartrate  50 mg Oral BID   mexiletine  300 mg Oral Q12H   montelukast  10 mg Oral Daily   polycarbophil  625 mg Oral Daily   rosuvastatin  40 mg Oral QHS   sacubitril-valsartan  1 tablet Oral BID   spironolactone  25 mg Oral Daily   Continuous Infusions:  sodium chloride     amiodarone 30 mg/hr (04/22/21 0532)   tirofiban 0.15 mcg/kg/min (04/22/21 0251)   PRN Meds: acetaminophen, albuterol, hydrALAZINE, hydrocortisone cream, nitroGLYCERIN, ondansetron (ZOFRAN) IV, zolpidem   Vital Signs    Vitals:   04/22/21 0300 04/22/21 0400 04/22/21 0500 04/22/21 0600  BP: 126/78 124/77  123/76  Pulse: 70 70  70  Resp: (!) 24 16  17   Temp:      TempSrc:      SpO2: 91% 92%  91%  Weight:   92.7 kg   Height:        Intake/Output Summary (Last 24 hours) at 04/22/2021 0720 Last data filed at 04/22/2021 0700 Gross per 24 hour  Intake 680.21 ml  Output 3050 ml  Net -2369.79 ml    Filed Weights   04/20/21 0500 04/21/21 0500 04/22/21 0500  Weight: 97.4 kg 86.4 kg 92.7 kg    Physical Exam    GEN: Well nourished, well developed, in no acute distress  HEENT: normal  Neck: no JVD, carotid bruits, or masses Cardiac: RRR; no murmurs, rubs, or gallops,no edema  Respiratory:  clear to  auscultation bilaterally, normal work of breathing GI: soft, nontender, nondistended, + BS MS: no deformity or atrophy  Skin: warm and dry, device site well healed Neuro:  Strength and sensation are intact Psych: euthymic mood, full affect   Labs    CBC Recent Labs    04/20/21 1853 04/22/21 0134  WBC 8.3 8.0  HGB 17.8* 17.8*  HCT 55.0* 56.0*  MCV 91.8 93.0  PLT 174 703    Basic Metabolic Panel Recent Labs    04/21/21 0248 04/22/21 0134  NA 140 140  K 3.9 4.3  CL 108 104  CO2 25 31  GLUCOSE 104* 85  BUN 16 21  CREATININE 1.06 1.38*  CALCIUM 8.8* 9.4  MG 2.2 2.3    Liver Function Tests No results for input(s): AST, ALT, ALKPHOS, BILITOT, PROT, ALBUMIN in the last 72 hours.  No results for input(s): LIPASE, AMYLASE in the last 72 hours. Cardiac Enzymes No results for input(s): CKTOTAL, CKMB, CKMBINDEX, TROPONINI in the last 72 hours.   Telemetry    NSR 70s (personally reviewed)  Radiology    No results found.  Patient Profile     John [name] is a 70 y.o. male with a past medical history significant for CHF, CAD, ILD, and VT  s/p ablation.  he was admitted for VT storm.    VT ablation 01/2021 At that time to distinct ventricular tachycardias were noted.  One was a posterolateral VT that was successfully ablated near the papillary muscle.  A second VT was not targeted for ablation but was described as a left superior axis VT  Assessment & Plan    1.  Recurrent hemodynamically intolerant nonsustained monomorphic ventricular tachycardia: Right bundle superior axis VT with positive precordial concordance.  He has not had further VT on IV amiodarone.  We Estefani Bateson switch to p.o. amiodarone today.  Would load with 400 mg twice daily for 2 weeks followed by 400 mg daily for 2 weeks followed by 200 mg daily.  Would continue mexiletine 300 mg twice daily. Keep K > 4, Mg > 2  2.  Chronic systolic heart failure: Due to ischemic cardiomyopathy.  Ejection fraction down  to 25 to 30% likely due to recurrent ventricular tachycardia.  Respiratory status is improved.  Continue heart failure medications.   3.  Coronary artery disease: ACS 02/15/2021 with PCI to the RCA.  4.  Lung mass: Plan for biopsy on Tuesday.  Is currently off of his Plavix.  Currently on Aggrastat.  We Kash Davie continue until the biopsy on Tuesday per critical care.  He Jalasia Eskridge need to have a full load of Plavix once the biopsy has been completed.  Would restart anticoagulation at that time as well.  For questions or updates, please contact [place] Please consult [email or web address] for contact info under Cardiology/STEMI.  Signed, Pavan Bring Meredith Leeds, MD  04/22/2021, 7:20 AM

## 2021-04-22 NOTE — Progress Notes (Signed)
04/22/2021   I have seen and evaluated the patient for lung mass, VT.  S:  No events, off O2, feels well. No further VT. On aggrastat  O: Blood pressure 111/80, pulse 70, temperature 98.2 F (36.8 C), temperature source Oral, resp. rate (!) 25, height 6' (1.829 m), weight 92.7 kg, SpO2 92 %.  No distress Lungs clear Heart sounds regular Ext warm, minimal edema  A:  Recurrent monomorphic VT, resolved Chronic systolic heart failure with some volume overload CAD with recent DES Lung mass with rapid growth needs biopsy  P:  - Amio titration to PO and diuretics per EP - Continue aggrastat until Tuesday 2AM - Bronch Tuesday with Icard, provided no procedural bleeding should be fine for plavix load after - Stable for transfer to telemetry - Get IS and encourage him to use  Appreciate TRH taking over as primary, will follow  Erskine Emery MD [facility] [place] epic messenger for cross cover needs If after hours, please call E-link

## 2021-04-22 NOTE — Progress Notes (Signed)
Pt refused CPAP tonight.

## 2021-04-22 NOTE — Plan of Care (Signed)
  Problem: Education: Goal: Knowledge of General Education information will improve Description: Including pain rating scale, medication(s)/side effects and non-pharmacologic comfort measures Outcome: Progressing   Problem: Activity: Goal: Risk for activity intolerance will decrease Outcome: Progressing   Problem: Nutrition: Goal: Adequate nutrition will be maintained Outcome: Progressing   Problem: Elimination: Goal: Will not experience complications related to urinary retention Outcome: Progressing   Problem: Pain Managment: Goal: General experience of comfort will improve Outcome: Progressing   Problem: Safety: Goal: Ability to remain free from injury will improve Outcome: Progressing   Problem: Skin Integrity: Goal: Risk for impaired skin integrity will decrease Outcome: Progressing

## 2021-04-23 DIAGNOSIS — I472 Ventricular tachycardia, unspecified: Secondary | ICD-10-CM | POA: Diagnosis not present

## 2021-04-23 LAB — BASIC METABOLIC PANEL
Anion gap: 6 (ref 5–15)
BUN: 23 mg/dL (ref 8–23)
CO2: 26 mmol/L (ref 22–32)
Calcium: 8.8 mg/dL — ABNORMAL LOW (ref 8.9–10.3)
Chloride: 106 mmol/L (ref 98–111)
Creatinine, Ser: 1.31 mg/dL — ABNORMAL HIGH (ref 0.61–1.24)
GFR, Estimated: 58 mL/min — ABNORMAL LOW (ref >60)
Glucose, Bld: 107 mg/dL — ABNORMAL HIGH (ref 70–99)
Potassium: 3.7 mmol/L (ref 3.5–5.1)
Sodium: 138 mmol/L (ref 135–145)

## 2021-04-23 LAB — MAGNESIUM: Magnesium: 2.2 mg/dL (ref 1.7–2.4)

## 2021-04-23 LAB — GLUCOSE, CAPILLARY
Glucose-Capillary: 137 mg/dL — ABNORMAL HIGH (ref 70–99)
Glucose-Capillary: 105 mg/dL — ABNORMAL HIGH (ref 70–99)
Glucose-Capillary: 204 mg/dL — ABNORMAL HIGH (ref 70–99)
Glucose-Capillary: 147 mg/dL — ABNORMAL HIGH (ref 70–99)

## 2021-04-23 NOTE — Progress Notes (Signed)
Progress Note   Subjective   Doing well today, the patient denies CP or SOB.  No new concerns  Inpatient Medications    Scheduled Meds:  amiodarone  400 mg Oral BID   budesonide  2 mL Inhalation BID   Chlorhexidine Gluconate Cloth  6 each Topical Daily   docusate sodium  100 mg Oral BID   empagliflozin  25 mg Oral Daily   insulin aspart  0-5 Units Subcutaneous QHS   insulin aspart  0-9 Units Subcutaneous TID WC   insulin detemir  46 Units Subcutaneous Daily   metoprolol tartrate  50 mg Oral BID   mexiletine  300 mg Oral Q12H   montelukast  10 mg Oral Daily   polycarbophil  625 mg Oral Daily   rosuvastatin  40 mg Oral QHS   sacubitril-valsartan  1 tablet Oral BID   spironolactone  25 mg Oral Daily   Continuous Infusions:  sodium chloride     tirofiban 0.15 mcg/kg/min (04/23/21 0724)   PRN Meds: acetaminophen, albuterol, hydrALAZINE, hydrocortisone cream, nitroGLYCERIN, ondansetron (ZOFRAN) IV, zolpidem   Vital Signs    Vitals:   04/22/21 2041 04/23/21 0620 04/23/21 0737 04/23/21 0807  BP: 113/66 106/77  103/68  Pulse: 76 69  70  Resp: 18 17    Temp: 97.6 F (36.4 C) 97.6 F (36.4 C)    TempSrc: Oral Oral    SpO2: 94% 93% 94%   Weight:  91.2 kg    Height:        Intake/Output Summary (Last 24 hours) at 04/23/2021 0859 Last data filed at 04/22/2021 1824 Gross per 24 hour  Intake 506.54 ml  Output 500 ml  Net 6.54 ml   Filed Weights   04/21/21 0500 04/22/21 0500 04/23/21 0620  Weight: 86.4 kg 92.7 kg 91.2 kg    Telemetry    sinus - Personally Reviewed  Physical Exam   GEN- The patient is well appearing, alert and oriented x 3 today.   Head- normocephalic, atraumatic Eyes-  Sclera clear, conjunctiva pink Ears- hearing intact Oropharynx- clear Neck- supple, Lungs-  normal work of breathing Heart- Regular rate and rhythm  GI- soft  Extremities- no clubbing, cyanosis, or edema  MS- no significant deformity or atrophy Skin- no rash or  lesion Psych- euthymic mood, full affect Neuro- strength and sensation are intact   Labs    Chemistry Recent Labs  Lab 04/17/21 1250 04/18/21 0133 04/18/21 2238 04/19/21 0113 04/21/21 0248 04/22/21 0134 04/23/21 0206  NA 141   < > 138   < > 140 140 138  K 3.7   < > 3.7   < > 3.9 4.3 3.7  CL 109   < > 110   < > 108 104 106  CO2 23   < > 23   < > 25 31 26   GLUCOSE 124*   < > 108*   < > 104* 85 107*  BUN 15   < > 15   < > 16 21 23   CREATININE 1.01   < > 1.05   < > 1.06 1.38* 1.31*  CALCIUM 8.6*   < > 8.5*   < > 8.8* 9.4 8.8*  PROT 5.8*  --  5.3*  --   --   --   --   ALBUMIN 3.5  --  3.0*  --   --   --   --   AST 23  --  15  --   --   --   --  ALT 18  --  13  --   --   --   --   ALKPHOS 71  --  71  --   --   --   --   BILITOT 1.1  --  0.5  --   --   --   --   GFRNONAA >60   < > >60   < > >60 54* 58*  ANIONGAP 9   < > 5   < > 7 5 6    < > = values in this interval not displayed.     Hematology Recent Labs  Lab 04/17/21 1250 04/20/21 1853 04/22/21 0134  WBC 2.4* 8.3 8.0  RBC 5.88* 5.99* 6.02*  HGB 17.7* 17.8* 17.8*  HCT 55.7* 55.0* 56.0*  MCV 94.7 91.8 93.0  MCH 30.1 29.7 29.6  MCHC 31.8 32.4 31.8  RDW 13.6 13.6 13.9  PLT 141* 174 168     Patient ID  John [name] is a 70 y.o. male with a past medical history significant for CHF, CAD, ILD, and VT s/p ablation.  he was admitted for VT storm.    VT ablation 01/2021 At that time to distinct ventricular tachycardias were noted.  One was a posterolateral VT that was successfully ablated near the papillary muscle.  A second VT was not targeted for ablation but was described as a left superior axis VT  Assessment & Plan    1.  VT Clinically improved with amiodarone Now stable on oral dosing. Per Dr Curt Bears "Would load with 400 mg twice daily for 2 weeks followed by 400 mg daily for 2 weeks followed by 200 mg daily.  Would continue mexiletine 300 mg twice daily. Keep K > 4, Mg > 2"  No driving x 6 months  2.  Ischemic CM/ CAD No ischemic symptoms Euvolemic  3. Lung mass Awaiting biopsy planned for this week.  Electrophysiology team to see as needed while here. Please call with questions.   Thompson Grayer MD, [facility] 04/23/2021 8:59 AM

## 2021-04-23 NOTE — Progress Notes (Signed)
PROGRESS NOTE    John [name]   ZJI:[id number]  DOB: 1950-08-22  PCP: [name], MD    DOA: 04/17/2021 LOS: 6    Brief Narrative / Hospital Course to Date:   70 year old male with past medical history significant for CHF, CAD, ILD, and VT s/p ablation and recurrent VT who presented on 04/17/2021 for sustained VT requiring cardioversion in the ER.  He was admitted on EP's service for management of his arrhythmia.    Cardiac rhythm has improved and patient being loaded with oral amiodarone.    Patient has a rapidly enlarging right upper lobe lung nodule that there was planned biopsy on Tuesday with Dr. Valeta Harms.  Due to a recent drug-eluting stent, patient requires DAPT.  He is being bridged with IV antiplatelet therapy and awaiting Plavix washout prior to bronchoscopy.  Assessment & Plan   Active Problems:   VT (ventricular tachycardia)   Recurrrent V-tach - management per EP. Required cardioversion in the ED on admission. --Oral amiodarone as per EP --Goal K>4, Mg>2 --Telemetry --Continue mexiletine --No driving x 6 months  Ischemic cardiomyopathy / CAD - no current chest pain or ischemic symptoms.  Appears euvolemic. Monitor.  Mgmt per cardiology/EP. --Plavix on hold for bronch --Continue IV Aggrastat until 2 AM Tuesday 11/8  RUL Lung Mass - Pulmomology plans to do bronchoscopy after Plavix wash out. --See PCCM recommendations --Incentive spirometer --Bronchoscopy planned Tuesday 11/8  Hemorrhoids - fiber and preparation H  Allergies: on Singulair  Type 2 diabetes: sliding scale innsulin  Patient BMI: Body mass index is 27.26 kg/m.   DVT prophylaxis: Place and maintain sequential compression device Start: 04/19/21 1020 SCDs Start: 04/17/21 1501   Diet:  Diet Orders (From admission, onward)     Start     Ordered   04/20/21 0900  Diet Carb Modified Fluid consistency: Thin; Room service appropriate? Yes  Diet effective now       Question Answer  Comment  Diet-HS Snack? Nothing   Calorie Level Medium 1600-2000   Fluid consistency: Thin   Room service appropriate? Yes      04/20/21 0859              Code Status: Full Code   Subjective 04/23/21    Pt sleeping comfortably when seen today.  Wakes briefly.  Denies CP, palpitations or any other complaints this AM.   Disposition Plan & Communication   Status is: Inpatient  Remains inpatient appropriate because: ongoing diagnostic evaluation with bronchoscopy planned for Tues 11/8.  Consults, Procedures, Significant Events   Consultants:  EP PCCM  Procedures:  Cardioversion 04/17/21  Antimicrobials:  Anti-infectives (From admission, onward)    None         Micro    Objective   Vitals:   04/23/21 0620 04/23/21 0737 04/23/21 0807 04/23/21 1400  BP: 106/77  103/68 112/84  Pulse: 69  70 70  Resp: 17   16  Temp: 97.6 F (36.4 C)   99.1 F (37.3 C)  TempSrc: Oral   Oral  SpO2: 93% 94%  95%  Weight: 91.2 kg     Height:        Intake/Output Summary (Last 24 hours) at 04/23/2021 1617 Last data filed at 04/23/2021 1600 Gross per 24 hour  Intake 993.55 ml  Output 125 ml  Net 868.55 ml   Filed Weights   04/21/21 0500 04/22/21 0500 04/23/21 0620  Weight: 86.4 kg 92.7 kg 91.2 kg    Physical  Exam:  General exam: sleeping comfortably, wakes briefly, no acute distress Respiratory system: CTAB, no wheezes, rales or rhonchi, normal respiratory effort. Cardiovascular system: normal S1/S2, RRR, no JVD, murmurs, rubs, gallops, no pedal edema.   Gastrointestinal system: soft, NT, ND, no HSM felt, +bowel sounds. Central nervous system: A&O x3. no gross focal neurologic deficits, normal speech Extremities: moves all, no edema, normal tone Psychiatry: normal mood, congruent affect, judgement and insight appear normal  Labs   Data Reviewed: I have personally reviewed following labs and imaging studies  CBC: Recent Labs  Lab 04/17/21 1250 04/20/21 1853  04/22/21 0134  WBC 2.4* 8.3 8.0  NEUTROABS 1.2*  --   --   HGB 17.7* 17.8* 17.8*  HCT 55.7* 55.0* 56.0*  MCV 94.7 91.8 93.0  PLT 141* 174 951   Basic Metabolic Panel: Recent Labs  Lab 04/19/21 0113 04/20/21 0127 04/21/21 0248 04/22/21 0134 04/23/21 0206  NA 138 135 140 140 138  K 3.9 3.9 3.9 4.3 3.7  CL 108 106 108 104 106  CO2 23 22 25 31 26   GLUCOSE 85 103* 104* 85 107*  BUN 14 13 16 21 23   CREATININE 1.03 0.94 1.06 1.38* 1.31*  CALCIUM 8.7* 8.5* 8.8* 9.4 8.8*  MG 2.6* 2.2 2.2 2.3 2.2   GFR: Estimated Creatinine Clearance: 55.9 mL/min (A) (by C-G formula based on SCr of 1.31 mg/dL (H)). Liver Function Tests: Recent Labs  Lab 04/17/21 1250 04/18/21 2238  AST 23 15  ALT 18 13  ALKPHOS 71 71  BILITOT 1.1 0.5  PROT 5.8* 5.3*  ALBUMIN 3.5 3.0*   No results for input(s): LIPASE, AMYLASE in the last 168 hours. No results for input(s): AMMONIA in the last 168 hours. Coagulation Profile: Recent Labs  Lab 04/17/21 1250  INR 1.1   Cardiac Enzymes: No results for input(s): CKTOTAL, CKMB, CKMBINDEX, TROPONINI in the last 168 hours. BNP (last 3 results) No results for input(s): PROBNP in the last 8760 hours. HbA1C: No results for input(s): HGBA1C in the last 72 hours. CBG: Recent Labs  Lab 04/22/21 1541 04/22/21 2126 04/23/21 0744 04/23/21 1150 04/23/21 1553  GLUCAP 100* 164* 137* 105* 204*   Lipid Profile: No results for input(s): CHOL, HDL, LDLCALC, TRIG, CHOLHDL, LDLDIRECT in the last 72 hours. Thyroid Function Tests: No results for input(s): TSH, T4TOTAL, FREET4, T3FREE, THYROIDAB in the last 72 hours. Anemia Panel: No results for input(s): VITAMINB12, FOLATE, FERRITIN, TIBC, IRON, RETICCTPCT in the last 72 hours. Sepsis Labs: No results for input(s): PROCALCITON, LATICACIDVEN in the last 168 hours.  Recent Results (from the past 240 hour(s))  Resp Panel by RT-PCR (Flu A&B, Covid) Nasopharyngeal Swab     Status: None   Collection Time: 04/17/21   1:00 PM   Specimen: Nasopharyngeal Swab; Nasopharyngeal(NP) swabs in vial transport medium  Result Value Ref Range Status   SARS Coronavirus 2 by RT PCR NEGATIVE NEGATIVE Final    Comment: (NOTE) SARS-CoV-2 target nucleic acids are NOT DETECTED.  The SARS-CoV-2 RNA is generally detectable in upper respiratory specimens during the acute phase of infection. The lowest concentration of SARS-CoV-2 viral copies this assay can detect is 138 copies/mL. A negative result does not preclude SARS-Cov-2 infection and should not be used as the sole basis for treatment or other patient management decisions. A negative result may occur with  improper specimen collection/handling, submission of specimen other than nasopharyngeal swab, presence of viral mutation(s) within the areas targeted by this assay, and inadequate number of viral copies(<138  copies/mL). A negative result must be combined with clinical observations, patient history, and epidemiological information. The expected result is Negative.  Fact Sheet for Patients:  EntrepreneurPulse.com.au  Fact Sheet for Healthcare Providers:  IncredibleEmployment.be  This test is no t yet approved or cleared by the [place] FDA and  has been authorized for detection and/or diagnosis of SARS-CoV-2 by FDA under an Emergency Use Authorization (EUA). This EUA will remain  in effect (meaning this test can be used) for the duration of the COVID-19 declaration under Section 564(b)(1) of the Act, 21 U.S.C.section 360bbb-3(b)(1), unless the authorization is terminated  or revoked sooner.       Influenza A by PCR NEGATIVE NEGATIVE Final   Influenza B by PCR NEGATIVE NEGATIVE Final    Comment: (NOTE) The Xpert Xpress SARS-CoV-2/FLU/RSV plus assay is intended as an aid in the diagnosis of influenza from Nasopharyngeal swab specimens and should not be used as a sole basis for treatment. Nasal washings and aspirates  are unacceptable for Xpert Xpress SARS-CoV-2/FLU/RSV testing.  Fact Sheet for Patients: EntrepreneurPulse.com.au  Fact Sheet for Healthcare Providers: IncredibleEmployment.be  This test is not yet approved or cleared by the [place] FDA and has been authorized for detection and/or diagnosis of SARS-CoV-2 by FDA under an Emergency Use Authorization (EUA). This EUA will remain in effect (meaning this test can be used) for the duration of the COVID-19 declaration under Section 564(b)(1) of the Act, 21 U.S.C. section 360bbb-3(b)(1), unless the authorization is terminated or revoked.  Performed at [facility], [place] [address], [place], [place] [address]   MRSA Next Gen by PCR, Nasal     Status: None   Collection Time: 04/17/21  4:21 PM   Specimen: Nasal Mucosa; Nasal Swab  Result Value Ref Range Status   MRSA by PCR Next Gen NOT DETECTED NOT DETECTED Final    Comment: (NOTE) The GeneXpert MRSA Assay (FDA approved for NASAL specimens only), is one component of a comprehensive MRSA colonization surveillance program. It is not intended to diagnose MRSA infection nor to guide or monitor treatment for MRSA infections. Test performance is not FDA approved in patients less than 26 years old. Performed at [facility], [place] [address] [place], [place] [address]       Imaging Studies   No results found.   Medications   Scheduled Meds:  amiodarone  400 mg Oral BID   budesonide  2 mL Inhalation BID   Chlorhexidine Gluconate Cloth  6 each Topical Daily   docusate sodium  100 mg Oral BID   empagliflozin  25 mg Oral Daily   insulin aspart  0-5 Units Subcutaneous QHS   insulin aspart  0-9 Units Subcutaneous TID WC   insulin detemir  46 Units Subcutaneous Daily   metoprolol tartrate  50 mg Oral BID   mexiletine  300 mg Oral Q12H   montelukast  10 mg Oral Daily   polycarbophil  625 mg Oral Daily   rosuvastatin  40 mg Oral QHS    sacubitril-valsartan  1 tablet Oral BID   spironolactone  25 mg Oral Daily   Continuous Infusions:  sodium chloride     tirofiban 0.15 mcg/kg/min (04/23/21 1405)       LOS: 6 days    Time spent: 30 minutes    Ezekiel Slocumb, DO [facility]  04/23/2021, 4:17 PM      If 7PM-7AM, please contact night-coverage. How to contact the [facility] Attending or Consulting provider 7A -  7P or covering provider during after hours [place], for this patient?    Check the care team in [facility] and look for a) attending/consulting TR[facility] provider listed and b) the [facility] team listed Log into [email or web address] and use [place]'s universal password to access. If you do not have the password, please contact the hospital operator. Locate the [facility] provider you are looking for under [facility] and page to a number that you can be directly reached. If you still have difficulty reaching the provider, please page the [facility] (Director on Call) for the Hospitalists listed on amion for assistance.

## 2021-04-23 NOTE — Progress Notes (Signed)
Pt refused CPAP for the night. RT will monitor as needed.

## 2021-04-24 ENCOUNTER — Other Ambulatory Visit: Payer: Self-pay

## 2021-04-24 DIAGNOSIS — I472 Ventricular tachycardia, unspecified: Secondary | ICD-10-CM | POA: Diagnosis not present

## 2021-04-24 LAB — BASIC METABOLIC PANEL
Anion gap: 5 (ref 5–15)
BUN: 21 mg/dL (ref 8–23)
CO2: 24 mmol/L (ref 22–32)
Calcium: 8.5 mg/dL — ABNORMAL LOW (ref 8.9–10.3)
Chloride: 110 mmol/L (ref 98–111)
Creatinine, Ser: 1.13 mg/dL (ref 0.61–1.24)
GFR, Estimated: >60 mL/min (ref >60)
Glucose, Bld: 135 mg/dL — ABNORMAL HIGH (ref 70–99)
Potassium: 3.8 mmol/L (ref 3.5–5.1)
Sodium: 139 mmol/L (ref 135–145)

## 2021-04-24 LAB — MAGNESIUM: Magnesium: 2.1 mg/dL (ref 1.7–2.4)

## 2021-04-24 LAB — GLUCOSE, CAPILLARY
Glucose-Capillary: 134 mg/dL — ABNORMAL HIGH (ref 70–99)
Glucose-Capillary: 180 mg/dL — ABNORMAL HIGH (ref 70–99)
Glucose-Capillary: 169 mg/dL — ABNORMAL HIGH (ref 70–99)
Glucose-Capillary: 184 mg/dL — ABNORMAL HIGH (ref 70–99)

## 2021-04-24 MED ORDER — POTASSIUM CHLORIDE CRYS ER 20 MEQ PO TBCR
40.0000 meq | EXTENDED_RELEASE_TABLET | Freq: Once | ORAL | Status: AC
  Administered 2021-04-24: 40 meq via ORAL
  Filled 2021-04-24: qty 2

## 2021-04-24 MED ORDER — AMIODARONE HCL IN DEXTROSE 360-4.14 MG/200ML-% IV SOLN
60.0000 mg/h | INTRAVENOUS | Status: DC
  Administered 2021-04-25: 30 mg/h via INTRAVENOUS
  Administered 2021-04-25 – 2021-04-27 (×6): 60 mg/h via INTRAVENOUS
  Filled 2021-04-24 (×11): qty 200

## 2021-04-24 MED ORDER — AMIODARONE HCL IN DEXTROSE 360-4.14 MG/200ML-% IV SOLN
60.0000 mg/h | INTRAVENOUS | Status: AC
  Administered 2021-04-24: 60 mg/h via INTRAVENOUS
  Filled 2021-04-24: qty 200

## 2021-04-24 NOTE — Progress Notes (Signed)
PROGRESS NOTE    John [name]   MVH:[id number]  DOB: April 30, 1951  PCP: [name], MD    DOA: 04/17/2021 LOS: 7    Brief Narrative / Hospital Course to Date:   70 year old male with past medical history significant for CHF, CAD, ILD, and VT s/p ablation and recurrent VT who presented on 04/17/2021 for sustained VT requiring cardioversion in the ER.  He was admitted on EP's service for management of his arrhythmia.    Cardiac rhythm has improved and patient being loaded with oral amiodarone.    Patient has a rapidly enlarging right upper lobe lung nodule that there was planned biopsy on Tuesday with Dr. Valeta Harms.  Due to a recent drug-eluting stent, patient requires DAPT.  He is being bridged with IV antiplatelet therapy and awaiting Plavix washout prior to bronchoscopy.  Assessment & Plan   Active Problems:   VT (ventricular tachycardia)   Recurrrent V-tach - management per EP. Required cardioversion in the ED on admission. --Oral amiodarone as per EP --Goal K>4, Mg>2 --Telemetry --Continue mexiletine --No driving x 6 months  Ischemic cardiomyopathy / CAD - no current chest pain or ischemic symptoms.  Appears euvolemic. Monitor.  Mgmt per cardiology/EP. --Plavix on hold for bronch --Continue IV Aggrastat until 2 AM Tuesday 11/8  RUL Lung Mass - Pulmomology plans to do bronchoscopy after Plavix wash out. --See PCCM recommendations --Incentive spirometer --Bronchoscopy planned Tuesday 11/8  Hemorrhoids - fiber and preparation H  Allergies: on Singulair  Type 2 diabetes: sliding scale innsulin  Patient BMI: Body mass index is 27.91 kg/m.   DVT prophylaxis: Place and maintain sequential compression device Start: 04/19/21 1020 SCDs Start: 04/17/21 1501   Diet:  Diet Orders (From admission, onward)     Start     Ordered   04/20/21 0900  Diet Carb Modified Fluid consistency: Thin; Room service appropriate? Yes  Diet effective now       Question Answer  Comment  Diet-HS Snack? Nothing   Calorie Level Medium 1600-2000   Fluid consistency: Thin   Room service appropriate? Yes      04/20/21 0859              Code Status: Full Code   Subjective 04/24/21    Pt awake resting in bed this AM.  Reports he feels well.  Denies CP, palpitations, SOB, F/C or other acute complaints.  Confirms plan for lung biopsy on Tuesday.    Disposition Plan & Communication   Status is: Inpatient  Remains inpatient appropriate because: ongoing diagnostic evaluation with bronchoscopy planned for Tues 11/8.  Consults, Procedures, Significant Events   Consultants:  EP PCCM  Procedures:  Cardioversion 04/17/21  Antimicrobials:  Anti-infectives (From admission, onward)    None         Micro    Objective   Vitals:   04/23/21 2014 04/24/21 0628 04/24/21 0833 04/24/21 0845  BP: 121/87 118/67 (!) 113/57   Pulse: 70 70 70   Resp: 15 19    Temp: 98.3 F (36.8 C) 98.3 F (36.8 C)    TempSrc: Oral Oral    SpO2: 95% 99%  96%  Weight:  93.4 kg    Height:        Intake/Output Summary (Last 24 hours) at 04/24/2021 1635 Last data filed at 04/24/2021 0730 Gross per 24 hour  Intake 360 ml  Output --  Net 360 ml   Filed Weights   04/22/21 0500 04/23/21 0620 04/24/21 8413  Weight: 92.7 kg 91.2 kg 93.4 kg    Physical Exam:  General exam: awake, alert, no acute distress Respiratory system: CTAB, no wheezes, rales or rhonchi, normal respiratory effort. Cardiovascular system: normal S1/S2, RRR, no pedal edema.   Central nervous system: A&O x3. no gross focal neurologic deficits, normal speech Extremities: moves all, no edema, normal tone Psychiatry: normal mood, congruent affect, judgement and insight appear normal  Labs   Data Reviewed: I have personally reviewed following labs and imaging studies  CBC: Recent Labs  Lab 04/20/21 1853 04/22/21 0134  WBC 8.3 8.0  HGB 17.8* 17.8*  HCT 55.0* 56.0*  MCV 91.8 93.0  PLT 174 233    Basic Metabolic Panel: Recent Labs  Lab 04/20/21 0127 04/21/21 0248 04/22/21 0134 04/23/21 0206 04/24/21 0207  NA 135 140 140 138 139  K 3.9 3.9 4.3 3.7 3.8  CL 106 108 104 106 110  CO2 22 25 31 26 24   GLUCOSE 103* 104* 85 107* 135*  BUN 13 16 21 23 21   CREATININE 0.94 1.06 1.38* 1.31* 1.13  CALCIUM 8.5* 8.8* 9.4 8.8* 8.5*  MG 2.2 2.2 2.3 2.2 2.1   GFR: Estimated Creatinine Clearance: 70.1 mL/min (by C-G formula based on SCr of 1.13 mg/dL). Liver Function Tests: Recent Labs  Lab 04/18/21 2238  AST 15  ALT 13  ALKPHOS 71  BILITOT 0.5  PROT 5.3*  ALBUMIN 3.0*   No results for input(s): LIPASE, AMYLASE in the last 168 hours. No results for input(s): AMMONIA in the last 168 hours. Coagulation Profile: No results for input(s): INR, PROTIME in the last 168 hours.  Cardiac Enzymes: No results for input(s): CKTOTAL, CKMB, CKMBINDEX, TROPONINI in the last 168 hours. BNP (last 3 results) No results for input(s): PROBNP in the last 8760 hours. HbA1C: No results for input(s): HGBA1C in the last 72 hours. CBG: Recent Labs  Lab 04/23/21 1150 04/23/21 1553 04/23/21 2131 04/24/21 0749 04/24/21 1059  GLUCAP 105* 204* 147* 134* 180*   Lipid Profile: No results for input(s): CHOL, HDL, LDLCALC, TRIG, CHOLHDL, LDLDIRECT in the last 72 hours. Thyroid Function Tests: No results for input(s): TSH, T4TOTAL, FREET4, T3FREE, THYROIDAB in the last 72 hours. Anemia Panel: No results for input(s): VITAMINB12, FOLATE, FERRITIN, TIBC, IRON, RETICCTPCT in the last 72 hours. Sepsis Labs: No results for input(s): PROCALCITON, LATICACIDVEN in the last 168 hours.  Recent Results (from the past 240 hour(s))  Resp Panel by RT-PCR (Flu A&B, Covid) Nasopharyngeal Swab     Status: None   Collection Time: 04/17/21  1:00 PM   Specimen: Nasopharyngeal Swab; Nasopharyngeal(NP) swabs in vial transport medium  Result Value Ref Range Status   SARS Coronavirus 2 by RT PCR NEGATIVE NEGATIVE Final     Comment: (NOTE) SARS-CoV-2 target nucleic acids are NOT DETECTED.  The SARS-CoV-2 RNA is generally detectable in upper respiratory specimens during the acute phase of infection. The lowest concentration of SARS-CoV-2 viral copies this assay can detect is 138 copies/mL. A negative result does not preclude SARS-Cov-2 infection and should not be used as the sole basis for treatment or other patient management decisions. A negative result may occur with  improper specimen collection/handling, submission of specimen other than nasopharyngeal swab, presence of viral mutation(s) within the areas targeted by this assay, and inadequate number of viral copies(<138 copies/mL). A negative result must be combined with clinical observations, patient history, and epidemiological information. The expected result is Negative.  Fact Sheet for Patients:  EntrepreneurPulse.com.au  Fact Sheet for  Healthcare Providers:  IncredibleEmployment.be  This test is no t yet approved or cleared by the [place] and  has been authorized for detection and/or diagnosis of SARS-CoV-2 by FDA under an Emergency Use Authorization (EUA). This EUA will remain  in effect (meaning this test can be used) for the duration of the COVID-19 declaration under Section 564(b)(1) of the Act, 21 U.S.C.section 360bbb-3(b)(1), unless the authorization is terminated  or revoked sooner.       Influenza A by PCR NEGATIVE NEGATIVE Final   Influenza B by PCR NEGATIVE NEGATIVE Final    Comment: (NOTE) The Xpert Xpress SARS-CoV-2/FLU/RSV plus assay is intended as an aid in the diagnosis of influenza from Nasopharyngeal swab specimens and should not be used as a sole basis for treatment. Nasal washings and aspirates are unacceptable for Xpert Xpress SARS-CoV-2/FLU/RSV testing.  Fact Sheet for Patients: EntrepreneurPulse.com.au  Fact Sheet for Healthcare  Providers: IncredibleEmployment.be  This test is not yet approved or cleared by the [place] FDA and has been authorized for detection and/or diagnosis of SARS-CoV-2 by FDA under an Emergency Use Authorization (EUA). This EUA will remain in effect (meaning this test can be used) for the duration of the COVID-19 declaration under Section 564(b)(1) of the Act, 21 U.S.C. section 360bbb-3(b)(1), unless the authorization is terminated or revoked.  Performed at [facility], [place] [address], [place], [place] [address]   MRSA Next Gen by PCR, Nasal     Status: None   Collection Time: 04/17/21  4:21 PM   Specimen: Nasal Mucosa; Nasal Swab  Result Value Ref Range Status   MRSA by PCR Next Gen NOT DETECTED NOT DETECTED Final    Comment: (NOTE) The GeneXpert MRSA Assay (FDA approved for NASAL specimens only), is one component of a comprehensive MRSA colonization surveillance program. It is not intended to diagnose MRSA infection nor to guide or monitor treatment for MRSA infections. Test performance is not FDA approved in patients less than 46 years old. Performed at [facility], [place] [address] [place],  [address]       Imaging Studies   No results found.   Medications   Scheduled Meds:  amiodarone  400 mg Oral BID   budesonide  2 mL Inhalation BID   Chlorhexidine Gluconate Cloth  6 each Topical Daily   docusate sodium  100 mg Oral BID   empagliflozin  25 mg Oral Daily   insulin aspart  0-5 Units Subcutaneous QHS   insulin aspart  0-9 Units Subcutaneous TID WC   insulin detemir  46 Units Subcutaneous Daily   metoprolol tartrate  50 mg Oral BID   mexiletine  300 mg Oral Q12H   montelukast  10 mg Oral Daily   polycarbophil  625 mg Oral Daily   rosuvastatin  40 mg Oral QHS   sacubitril-valsartan  1 tablet Oral BID   spironolactone  25 mg Oral Daily   Continuous Infusions:  sodium chloride     tirofiban 0.15 mcg/kg/min (04/24/21  1540)       LOS: 7 days    Time spent: 25 minutes with > 50% spent at bedside and in coordination of care     Ezekiel Slocumb, DO [facility]  04/24/2021, 4:35 PM      If 7PM-7AM, please contact night-coverage. How to contact the [facility] Attending or Consulting provider [place] or covering provider during after hours [place], for this patient?    Check the care  team in [facility] and look for a) attending/consulting [place] provider listed and b) the [facility] team listed Log into [email or web address] and use [place]'s universal password to access. If you do not have the password, please contact the hospital operator. Locate the [facility] provider you are looking for under [facility] and page to a number that you can be directly reached. If you still have difficulty reaching the provider, please page the [facility] (Director on Call) for the Hospitalists listed on amion for assistance.

## 2021-04-24 NOTE — Progress Notes (Signed)
   The patient is in a wide complex rhythm rate 105.  He has been in and out of this today and now sustaining.   No symptoms and hemodynamically stable.  I discussed with Dr. Rayann Heman and we will put the patient on IV amiodarone tonight.

## 2021-04-24 NOTE — Progress Notes (Signed)
Pt had 7 beats run of VT while asleep and flipped to ventricular rhythm for few minutes.  He is now back in paced rhythm. Cardiology made aware and received order for K dur. Idolina Primer, RN

## 2021-04-24 NOTE — Progress Notes (Signed)
Pt refused nightly CPAP after some encouragement, but vitals are stable and pt not in distress. RT will continue to monitor as needed.

## 2021-04-25 ENCOUNTER — Inpatient Hospital Stay (HOSPITAL_COMMUNITY): Payer: Medicare Other

## 2021-04-25 ENCOUNTER — Ambulatory Visit: Payer: Medicare Other | Admitting: Acute Care

## 2021-04-25 DIAGNOSIS — I472 Ventricular tachycardia, unspecified: Secondary | ICD-10-CM | POA: Diagnosis not present

## 2021-04-25 DIAGNOSIS — R911 Solitary pulmonary nodule: Secondary | ICD-10-CM | POA: Diagnosis not present

## 2021-04-25 LAB — BASIC METABOLIC PANEL
Anion gap: 6 (ref 5–15)
BUN: 17 mg/dL (ref 8–23)
CO2: 24 mmol/L (ref 22–32)
Calcium: 8.5 mg/dL — ABNORMAL LOW (ref 8.9–10.3)
Chloride: 110 mmol/L (ref 98–111)
Creatinine, Ser: 1.07 mg/dL (ref 0.61–1.24)
GFR, Estimated: >60 mL/min (ref >60)
Glucose, Bld: 106 mg/dL — ABNORMAL HIGH (ref 70–99)
Potassium: 4 mmol/L (ref 3.5–5.1)
Sodium: 140 mmol/L (ref 135–145)

## 2021-04-25 LAB — CBC
HCT: 49.9 % (ref 39.0–52.0)
Hemoglobin: 16.7 g/dL (ref 13.0–17.0)
MCH: 31.2 pg (ref 26.0–34.0)
MCHC: 33.5 g/dL (ref 30.0–36.0)
MCV: 93.1 fL (ref 80.0–100.0)
Platelets: 182 10*3/uL (ref 150–400)
RBC: 5.36 MIL/uL (ref 4.22–5.81)
RDW: 13.4 % (ref 11.5–15.5)
WBC: 6.7 10*3/uL (ref 4.0–10.5)
nRBC: 0 % (ref 0.0–0.2)

## 2021-04-25 LAB — MAGNESIUM: Magnesium: 2.2 mg/dL (ref 1.7–2.4)

## 2021-04-25 LAB — GLUCOSE, CAPILLARY
Glucose-Capillary: 120 mg/dL — ABNORMAL HIGH (ref 70–99)
Glucose-Capillary: 167 mg/dL — ABNORMAL HIGH (ref 70–99)
Glucose-Capillary: 167 mg/dL — ABNORMAL HIGH (ref 70–99)
Glucose-Capillary: 171 mg/dL — ABNORMAL HIGH (ref 70–99)

## 2021-04-25 MED ORDER — AMIODARONE LOAD VIA INFUSION
150.0000 mg | Freq: Once | INTRAVENOUS | Status: AC
  Administered 2021-04-25: 150 mg via INTRAVENOUS

## 2021-04-25 MED ORDER — POTASSIUM CHLORIDE CRYS ER 20 MEQ PO TBCR
20.0000 meq | EXTENDED_RELEASE_TABLET | Freq: Once | ORAL | Status: AC
  Administered 2021-04-25: 20 meq via ORAL
  Filled 2021-04-25: qty 1

## 2021-04-25 NOTE — H&P (View-Only) (Signed)
NAME:  John [name], MRN:  [id number], DOB:  [birth year], LOS: 64 ADMISSION DATE:  04/17/2021, CONSULTATION DATE:  04/20/21 REFERRING MD:  Lovena Le, CHIEF COMPLAINT:  palpitations    History of Present Illness:  70 year old man with complex ischemic heart history and recurrent Vtach who presented for sustained VT requiring cardioversion in ER.  He has been on EP service for management of this but now is improved.  His current complaints include hemorrhoids and constipation.  He has a rapidly enlarging RUL nodule that there was plan to biopsy on Tuesday with Dr. Valeta Harms.  We are going to have to bridge him with IV antiplatelet therapy due to recent DES.    Pertinent  Medical History   Past Medical History:  Diagnosis Date   Atrial flutter ([place])    s/p cardioversion   Coronary artery disease    Diabetes mellitus    GERD (gastroesophageal reflux disease)    History of nuclear stress test 04/04/2011   lexiscan; mod-large in size fixed inferolateral defect (scar); non-diagnostic for ischemia; low risk scan    Hyperlipidemia    Hypertension    Left foot drop    r/t past disk srugery - uses Kevlar brace   Myocardial infarction ([place])    posterior MI   Shortness of breath    Sleep apnea    on CPAP; 04/28/2007 split-night - AHI during total sleep 44.43/hr and REM 72.56/hr   Significant Hospital Events: Including procedures, antibiotic start and stop dates in addition to other pertinent events   10/30 admitted cardioverted  Interim History / Subjective:  Had episode of VT this am. Given amio bolus with spontaneous resolution. Nowf eeling anxious and no longer wanting to have bronchoscopy however states he would reconsider it tomorrow morning Objective   Blood pressure 122/76, pulse 70, temperature 97.6 F (36.4 C), temperature source Oral, resp. rate 18, height 6' (1.829 m), weight 93.5 kg, SpO2 95 %.        Intake/Output Summary (Last 24 hours) at 04/25/2021 1128 Last data filed at  04/25/2021 0954 Gross per 24 hour  Intake 1404.02 ml  Output --  Net 1404.02 ml   Filed Weights   04/23/21 0620 04/24/21 0628 04/25/21 0511  Weight: 91.2 kg 93.4 kg 93.5 kg   Physical Exam: General: Chronically ill-appearing, no acute distress HENT: [place], AT Eyes: EOMI, no scleral icterus Respiratory: Clear to auscultation bilaterally.  No crackles, wheezing or rales Cardiovascular: RRR, -M/R/G, no JVD Extremities:-Edema,-tenderness Neuro: AAO x4, CNII-XII grossly intact Psych: Normal mood, normal affect  Resolved Hospital Problem list   N/A  Assessment & Plan:  Recurrent monomorphic VT s/p ICD 08/2020, ablation 01/2021 NICM/Chronic systolic heart failure CAD s/p DES --Telemetry --per EP: On IV amiodarone  Enlarging RUL nodule, 6x4 mm to 2 x 19 mm Concerning for aggressive lung malignancy (see Dr. Juline Patch notes) --Aggrastat until 11/8 2AM --Tentative plan for ENB on Tuesday --Patient is anxious regarding procedure and expresses understanding that delaying procedure will result in progression of probable malignancy and delayed treatment options  --Updated Dr. Valeta Harms regarding patient's clinical condition. Will maintain patient on procedure schedule as patient does state he may consider procedure in the morning   Best Practice (right click and "Reselect all SmartList Selections" daily)   Diet/type: Regular consistency (see orders) DVT prophylaxis: SCD GI prophylaxis: PPI Lines: N/A Foley:  N/A Code Status:  full code Last date of multidisciplinary goals of care discussion [n/a]  Labs   CBC: Recent Labs  Lab 04/20/21 1853 04/22/21 0134 04/25/21 0436  WBC 8.3 8.0 6.7  HGB 17.8* 17.8* 16.7  HCT 55.0* 56.0* 49.9  MCV 91.8 93.0 93.1  PLT 174 168 789    Basic Metabolic Panel: Recent Labs  Lab 04/21/21 0248 04/22/21 0134 04/23/21 0206 04/24/21 0207 04/25/21 0436  NA 140 140 138 139 140  K 3.9 4.3 3.7 3.8 4.0  CL 108 104 106 110 110  CO2 25 31 26 24 24    GLUCOSE 104* 85 107* 135* 106*  BUN 16 21 23 21 17   CREATININE 1.06 1.38* 1.31* 1.13 1.07  CALCIUM 8.8* 9.4 8.8* 8.5* 8.5*  MG 2.2 2.3 2.2 2.1 2.2   Care Time: 37 min  Rodman Pickle, M.D. [facility] 04/25/2021 11:30 AM   See Amion for personal pager For hours between 7 PM to 7 AM, please call Elink for urgent questions

## 2021-04-25 NOTE — Consult Note (Signed)
NAME:  John [name], MRN:  [id number], DOB:  1950-10-23, LOS: 69 ADMISSION DATE:  04/17/2021, CONSULTATION DATE:  04/20/21 REFERRING MD:  Lovena Le, CHIEF COMPLAINT:  palpitations    History of Present Illness:  70 year old man with complex ischemic heart history and recurrent Vtach who presented for sustained VT requiring cardioversion in ER.  He has been on EP service for management of this but now is improved.  His current complaints include hemorrhoids and constipation.  He has a rapidly enlarging RUL nodule that there was plan to biopsy on Tuesday with Dr. Valeta Harms.  We are going to have to bridge him with IV antiplatelet therapy due to recent DES.    Pertinent  Medical History   Past Medical History:  Diagnosis Date   Atrial flutter ([place])    s/p cardioversion   Coronary artery disease    Diabetes mellitus    GERD (gastroesophageal reflux disease)    History of nuclear stress test 04/04/2011   lexiscan; mod-large in size fixed inferolateral defect (scar); non-diagnostic for ischemia; low risk scan    Hyperlipidemia    Hypertension    Left foot drop    r/t past disk srugery - uses Kevlar brace   Myocardial infarction ([place])    posterior MI   Shortness of breath    Sleep apnea    on CPAP; 04/28/2007 split-night - AHI during total sleep 44.43/hr and REM 72.56/hr   Significant Hospital Events: Including procedures, antibiotic start and stop dates in addition to other pertinent events   10/30 admitted cardioverted  Interim History / Subjective:  Had episode of VT this am. Given amio bolus with spontaneous resolution. Nowf eeling anxious and no longer wanting to have bronchoscopy however states he would reconsider it tomorrow morning Objective   Blood pressure 122/76, pulse 70, temperature 97.6 F (36.4 C), temperature source Oral, resp. rate 18, height 6' (1.829 m), weight 93.5 kg, SpO2 95 %.        Intake/Output Summary (Last 24 hours) at 04/25/2021 1128 Last data filed at  04/25/2021 0954 Gross per 24 hour  Intake 1404.02 ml  Output --  Net 1404.02 ml   Filed Weights   04/23/21 0620 04/24/21 0628 04/25/21 0511  Weight: 91.2 kg 93.4 kg 93.5 kg   Physical Exam: General: Chronically ill-appearing, no acute distress HENT: [place], AT Eyes: EOMI, no scleral icterus Respiratory: Clear to auscultation bilaterally.  No crackles, wheezing or rales Cardiovascular: RRR, -M/R/G, no JVD Extremities:-Edema,-tenderness Neuro: AAO x4, CNII-XII grossly intact Psych: Normal mood, normal affect  Resolved Hospital Problem list   N/A  Assessment & Plan:  Recurrent monomorphic VT s/p ICD 08/2020, ablation 01/2021 NICM/Chronic systolic heart failure CAD s/p DES --Telemetry --per EP: On IV amiodarone  Enlarging RUL nodule, 6x4 mm to 2 x 19 mm Concerning for aggressive lung malignancy (see Dr. Juline Patch notes) --Aggrastat until 11/8 2AM --Tentative plan for ENB on Tuesday --Patient is anxious regarding procedure and expresses understanding that delaying procedure will result in progression of probable malignancy and delayed treatment options  --Updated Dr. Valeta Harms regarding patient's clinical condition. Will maintain patient on procedure schedule as patient does state he may consider procedure in the morning   Best Practice (right click and "Reselect all SmartList Selections" daily)   Diet/type: Regular consistency (see orders) DVT prophylaxis: SCD GI prophylaxis: PPI Lines: N/A Foley:  N/A Code Status:  full code Last date of multidisciplinary goals of care discussion [n/a]  Labs   CBC: Recent Labs  Lab 04/20/21 1853 04/22/21 0134 04/25/21 0436  WBC 8.3 8.0 6.7  HGB 17.8* 17.8* 16.7  HCT 55.0* 56.0* 49.9  MCV 91.8 93.0 93.1  PLT 174 168 583    Basic Metabolic Panel: Recent Labs  Lab 04/21/21 0248 04/22/21 0134 04/23/21 0206 04/24/21 0207 04/25/21 0436  NA 140 140 138 139 140  K 3.9 4.3 3.7 3.8 4.0  CL 108 104 106 110 110  CO2 25 31 26 24 24    GLUCOSE 104* 85 107* 135* 106*  BUN 16 21 23 21 17   CREATININE 1.06 1.38* 1.31* 1.13 1.07  CALCIUM 8.8* 9.4 8.8* 8.5* 8.5*  MG 2.2 2.3 2.2 2.1 2.2   Care Time: 37 min  Rodman Pickle, M.D. [facility] 04/25/2021 11:30 AM   See Amion for personal pager For hours between 7 PM to 7 AM, please call Elink for urgent questions

## 2021-04-25 NOTE — Evaluation (Signed)
Physical Therapy Evaluation/ Discharge Patient Details Name: John [name] MRN: [id number] DOB: 12-18-1950 Today's Date: 04/25/2021  History of Present Illness  70 yo admitted 10/30 with chest fluttering and SOB with VTach receiving ICD shock at home and cardioverted in ED. Pt with RUL nodule and difficulty with rhythm management. PMHx: CHF, CAD, NSTEMI, ILD, lung mass, CVA 02/15/21  Clinical Impression  PT very pleasant and reports he has returned to driving after his recent stroke and performs his own mobility and ADLs and assists wife at times. Pt with 3 falls in the last 6 months which pt relates to his foot drop but able to rise on his own. Pt also very in tune with his HR as able to guess rate at 90 without looking at monitor. Pt currently at baseline functional level with baseline weakness and balance deficits. PT would benefit from OPPT to further address strengthening and balance as he was unable to receive therapy post stroke due to transportation issues. No further acute therapy needs with pt aware and agreeable. Will sign off.        Recommendations for follow up therapy are one component of a multi-disciplinary discharge planning process, led by the attending physician.  Recommendations may be updated based on patient status, additional functional criteria and insurance authorization.  Follow Up Recommendations Outpatient PT    Assistance Recommended at Discharge None  Functional Status Assessment Patient has had a recent decline in their functional status and demonstrates the ability to make significant improvements in function in a reasonable and predictable amount of time.  Equipment Recommendations  None recommended by PT    Recommendations for Other Services       Precautions / Restrictions Precautions Precautions: Fall Precaution Comments: left foot drop, HOH Right ear, diplopia using eye patch      Mobility  Bed Mobility               General bed mobility  comments: pt standing on arrival    Transfers Overall transfer level: Independent                      Ambulation/Gait Ambulation/Gait assistance: Modified independent (Device/Increase time) Gait Distance (Feet): 500 Feet Assistive device: IV Pole Gait Pattern/deviations: Step-through pattern;Decreased stride length;Decreased stance time - left;Decreased dorsiflexion - left   Gait velocity interpretation: 1.31 - 2.62 ft/sec, indicative of limited community ambulator   General Gait Details: pt with foot drop left with reliance on IV pole for support and stability with gait. Pt with guarding x 1 during turn  Stairs Stairs: Yes Stairs assistance: Modified independent (Device/Increase time) Stair Management: Two rails;Forwards;Alternating pattern Number of Stairs: 3 General stair comments: pt with use of bil rails able to ascend stairs  Wheelchair Mobility    Modified Rankin (Stroke Patients Only)       Balance Overall balance assessment: Needs assistance;History of Falls   Sitting balance-Leahy Scale: Good       Standing balance-Leahy Scale: Fair Standing balance comment: pt able to wash hands without support, gait with use of IV pole for stability                             Pertinent Vitals/Pain Pain Assessment: No/denies pain    Home Living Family/patient expects to be discharged to:: Private residence Living Arrangements: Spouse/significant other Available Help at Discharge: Family;Available 24 hours/day Type of Home: House Home Access: Stairs to enter;Ramped entrance Entrance  Stairs-Rails: Right;Left Entrance Stairs-Number of Steps: 3   Home Layout: One level Home Equipment: Shower seat;Grab bars - tub/shower;Hand held shower head;Other (comment);Cane - single point      Prior Function Prior Level of Function : Independent/Modified Independent;Driving             Mobility Comments: pt walks with cane, uses AFO when in community,  drives ADLs Comments: pt caring for himself and was caring for wife     Hand Dominance        Extremity/Trunk Assessment   Upper Extremity Assessment Upper Extremity Assessment: Overall WFL for tasks assessed    Lower Extremity Assessment Lower Extremity Assessment: LLE deficits/detail LLE Deficits / Details: foot drop at baseline    Cervical / Trunk Assessment Cervical / Trunk Assessment: Normal  Communication   Communication: HOH (HOH Rt ear)  Cognition Arousal/Alertness: Awake/alert Behavior During Therapy: WFL for tasks assessed/performed Overall Cognitive Status: Within Functional Limits for tasks assessed                                          General Comments      Exercises     Assessment/Plan    PT Assessment All further PT needs can be met in the next venue of care  PT Problem List Decreased mobility;Decreased activity tolerance;Decreased balance;Decreased safety awareness       PT Treatment Interventions      PT Goals (Current goals can be found in the Care Plan section)  Acute Rehab PT Goals PT Goal Formulation: All assessment and education complete, DC therapy    Frequency     Barriers to discharge        Co-evaluation               AM-PAC PT "6 Clicks" Mobility  Outcome Measure Help needed turning from your back to your side while in a flat bed without using bedrails?: None Help needed moving from lying on your back to sitting on the side of a flat bed without using bedrails?: None Help needed moving to and from a bed to a chair (including a wheelchair)?: None Help needed standing up from a chair using your arms (e.g., wheelchair or bedside chair)?: None Help needed to walk in hospital room?: A Little Help needed climbing 3-5 steps with a railing? : None 6 Click Score: 23    End of Session   Activity Tolerance: Patient tolerated treatment well Patient left: in chair;with call bell/phone within reach Nurse  Communication: Mobility status PT Visit Diagnosis: Difficulty in walking, not elsewhere classified (R26.2);Other symptoms and signs involving the nervous system (R29.898)    Time: 2951-8841 PT Time Calculation (min) (ACUTE ONLY): 22 min   Charges:   PT Evaluation $PT Eval Moderate Complexity: 1 Mod          Andrews, PT Acute Rehabilitation Services Pager: [phone] Office: [phone]   Sandy Salaam Lukasz Rogus 04/25/2021, 12:11 PM

## 2021-04-25 NOTE — Progress Notes (Addendum)
Progress Note   Subjective   I feel "weak as pond water", this AM had a pressure in his throat that made him feel poorly (time-wise lines up with his VT/ATPs)  Inpatient Medications    Scheduled Meds:  budesonide  2 mL Inhalation BID   Chlorhexidine Gluconate Cloth  6 each Topical Daily   docusate sodium  100 mg Oral BID   empagliflozin  25 mg Oral Daily   insulin aspart  0-5 Units Subcutaneous QHS   insulin aspart  0-9 Units Subcutaneous TID WC   insulin detemir  46 Units Subcutaneous Daily   metoprolol tartrate  50 mg Oral BID   mexiletine  300 mg Oral Q12H   montelukast  10 mg Oral Daily   polycarbophil  625 mg Oral Daily   rosuvastatin  40 mg Oral QHS   sacubitril-valsartan  1 tablet Oral BID   spironolactone  25 mg Oral Daily   Continuous Infusions:  sodium chloride     amiodarone 30 mg/hr (04/25/21 0012)   tirofiban 0.15 mcg/kg/min (04/25/21 0334)   PRN Meds: acetaminophen, albuterol, hydrALAZINE, hydrocortisone cream, nitroGLYCERIN, ondansetron (ZOFRAN) IV, zolpidem   Vital Signs    Vitals:   04/25/21 0008 04/25/21 0511 04/25/21 0646 04/25/21 0732  BP: 114/77 (!) 149/85 (!) 151/90   Pulse: 71 74 70 67  Resp: 18 18    Temp: (!) 97.2 F (36.2 C) 98.1 F (36.7 C)    TempSrc: Axillary Axillary    SpO2: 97% 95% 97%   Weight:  93.5 kg    Height:       No intake or output data in the 24 hours ending 04/25/21 0733  Filed Weights   04/23/21 0620 04/24/21 0628 04/25/21 0511  Weight: 91.2 kg 93.4 kg 93.5 kg    Telemetry    AP acied, in the last 24 hours his VT has started back up, some rates high 100'ss-120's, A pacing otherwise - Personally Reviewed  Physical Exam    GEN- The patient is in no distress, alert and oriented x 3 today.   Head- normocephalic, atraumatic Eyes-  Sclera clear, conjunctiva pink Ears- hearing intact Oropharynx- clear Neck- supple, Lungs-  normal work of breathing Heart- Regular rate and rhythm  GI- soft  Extremities- no  clubbing, cyanosis, or edema  MS- no significant deformity or atrophy Skin- no rash or lesion Psych- euthymic mood, full affect Neuro- strength and sensation are intact   Labs    Chemistry Recent Labs  Lab 04/18/21 2238 04/19/21 0113 04/23/21 0206 04/24/21 0207 04/25/21 0436  NA 138   < > 138 139 140  K 3.7   < > 3.7 3.8 4.0  CL 110   < > 106 110 110  CO2 23   < > 26 24 24   GLUCOSE 108*   < > 107* 135* 106*  BUN 15   < > 23 21 17   CREATININE 1.05   < > 1.31* 1.13 1.07  CALCIUM 8.5*   < > 8.8* 8.5* 8.5*  PROT 5.3*  --   --   --   --   ALBUMIN 3.0*  --   --   --   --   AST 15  --   --   --   --   ALT 13  --   --   --   --   ALKPHOS 71  --   --   --   --   BILITOT 0.5  --   --   --   --  GFRNONAA >60   < > 58* >60 >60  ANIONGAP 5   < > 6 5 6    < > = values in this interval not displayed.     Hematology Recent Labs  Lab 04/20/21 1853 04/22/21 0134 04/25/21 0436  WBC 8.3 8.0 6.7  RBC 5.99* 6.02* 5.36  HGB 17.8* 17.8* 16.7  HCT 55.0* 56.0* 49.9  MCV 91.8 93.0 93.1  MCH 29.7 29.6 31.2  MCHC 32.4 31.8 33.5  RDW 13.6 13.9 13.4  PLT 174 168 182     Patient ID  NOTNAMED John [name] is a 70 y.o. male with a past medical history significant for CHF, CAD (02/14/21 with CP, NSTEMI, underwent cath and PCI  (Difficult for successful PTCA/PCI using 3.5 mm ScoreFlex balloon multiple inflations with post dilation of the previously stented) overlapping Onyx Frontier DES 2.75 mm x 22 mm placed overlapping distally); prox RCA ~50%, prox-distal RCA 35%.  Unfortunately suffered a stroke), ILD, and VT s/p ablation. Lung mass  he was admitted again this admission for VT storm.    VT ablation 01/2021 At that time to distinct ventricular tachycardias were noted.  One was a posterolateral VT that was successfully ablated near the papillary muscle.  A second VT was not targeted for ablation but was described as a left superior axis VT   Device information MDT dual chamber ICD implanted  08/20/20 Secondary prevention w/hx of hemodynamically unstable MMVT   AAD hx 2018 Pulmonary function studies have shown restriction with low DLCO.  He felt possibly also to have asthma based on nitric oxide testing.  With his lung disease it has been recommended that amiodarone be considered for discontinuance Looks like amio was stopped in 2019 Follows with pulmonology March 2022 mexilletine > recurrent VT higher doses were too expensive June 2022 sotalol started Aug 2022 more VT and mexiletine resumed 01/18/21, VT ablation 02/09/21, device interrogation done for MRI noted VT treated with ATP and mexiletine ordered for increase to 300mg  BID >>>> THIS DID NOT GET TRANSLATED ON DISCHARGE AND remained at 120ms 02/25/21 VT storm,  mexiletine increased 04/17/21: VT, symptomatic/intolerable, amiodarone added, sotalol stopped  Assessment & Plan    1.  VT Recurrent sustained VT yesterday intermittently rates  100's-120's Pt aware in that he felt much worse yesterday and this AM then he has in several days Was aware of this AM VTs with some pressure in his throat Keep K > 4, Mg > 2  Lytes look OK K+ 4.0, Manreet Kiernan give a small supplement   Dr. Curt Bears has seen the patient this AM Keep amio gtt going today  No driving x 6 months  2. Ischemic CM/ CAD No ischemic symptoms Euvolemic    3. Lung mass Awaiting biopsy planned for this week. The patient wants to cancel this, and is firm Dr. Curt Bears recommended he discuss further with Dr. Valeta Harms, he is willing to talk to pulmonology but does not want to proceed with the biopsy We Tamryn Popko defer to them/IM biopsy or not and antiplatelet management  If biopsy/treatment not planned, suspect palliative conversations would need to be considered.     Tommye Standard, PA-C 04/25/2021 7:33 AM  I have seen and examined this patient with Tommye Standard.  Agree with above, note added to reflect my findings.  On exam, RRR, no murmurs.  Patient began having slow VT  yesterday.  He also went into VT this morning around 7 AM requiring ATP.  He had further episodes of ventricular tachycardia this  morning, but converted spontaneously.  He is back on amiodarone.  He is stating that he would like to avoid his biopsy tomorrow.  I discussed this with his pulmonologist.  We Zendaya Groseclose also call palliative care back to have a further conversation with him about goals of care.  We Jensyn Cambria continue IV amiodarone and p.o. mexiletine.  Chantay Whitelock M. Loucille Takach MD 04/25/2021 1:58 PM

## 2021-04-25 NOTE — Progress Notes (Signed)
Pt states he doesn't want to wear CPAP for the night,

## 2021-04-25 NOTE — Progress Notes (Signed)
PROGRESS NOTE    John [name]   DGU:[id number]  DOB: 04/02/1951  PCP: [name], MD    DOA: 04/17/2021 LOS: 8    Brief Narrative / Hospital Course to Date:   70 year old male with past medical history significant for CHF, CAD, ILD, and VT s/p ablation and recurrent VT who presented on 04/17/2021 for sustained VT requiring cardioversion in the ER.  He was admitted on EP's service for management of his arrhythmia.    Cardiac rhythm has improved and patient being loaded with oral amiodarone.    Patient has a rapidly enlarging right upper lobe lung nodule that there was planned biopsy on Tuesday with Dr. Valeta Harms.  Due to a recent drug-eluting stent, patient requires DAPT.  He is being bridged with IV antiplatelet therapy and awaiting Plavix washout prior to bronchoscopy.  Assessment & Plan   Active Problems:   VT (ventricular tachycardia)   Recurrrent V-tach - management per EP. Required cardioversion in the ED on admission. 11/7 - AM having sustained runs, HR in 120-130's during my encounter.  Given amio bolus and drip increased earlier per EP. --Oral amiodarone as per EP --Goal K>4, Mg>2 --Telemetry --Continue mexiletine --No driving x 6 months  Ischemic cardiomyopathy / CAD - no current chest pain or ischemic symptoms.  Appears euvolemic. Monitor.  Mgmt per cardiology/EP. --Plavix on hold for bronch --Continue IV Aggrastat until 2 AM Tuesday 11/8  RUL Lung Mass - Pulmomology plans to do bronchoscopy after Plavix wash out. --See PCCM recommendations --Incentive spirometer --Bronchoscopy planned Tuesday 11/8  Hemorrhoids - fiber and preparation H  Allergies: on Singulair  Type 2 diabetes: sliding scale innsulin  Patient BMI: Body mass index is 27.95 kg/m.   DVT prophylaxis: Place and maintain sequential compression device Start: 04/19/21 1020 SCDs Start: 04/17/21 1501   Diet:  Diet Orders (From admission, onward)     Start     Ordered   04/20/21  0900  Diet Carb Modified Fluid consistency: Thin; Room service appropriate? Yes  Diet effective now       Question Answer Comment  Diet-HS Snack? Nothing   Calorie Level Medium 1600-2000   Fluid consistency: Thin   Room service appropriate? Yes      04/20/21 0859              Code Status: Full Code   Subjective 04/25/21    Pt awake resting in bed this AM.  Having runs of VT again this AM.  He reports feeling very tired, but no CP or palpitations.  He sometimes can feel heart "fluttering" but not this AM.  Otherwise denies acute complaints.    Disposition Plan & Communication   Status is: Inpatient  Remains inpatient appropriate because: ongoing diagnostic evaluation with bronchoscopy planned for Tues 11/8.  Ongoing sustained runs of VT.  Consults, Procedures, Significant Events   Consultants:  EP PCCM  Procedures:  Cardioversion 04/17/21  Antimicrobials:  Anti-infectives (From admission, onward)    None         Micro    Objective   Vitals:   04/25/21 0941 04/25/21 1119 04/25/21 1403 04/25/21 1633  BP: (!) 145/87 122/76 136/89 129/79  Pulse: 70 70 70 69  Resp:  18 18 18   Temp:  97.6 F (36.4 C) 99 F (37.2 C) (!) 97.5 F (36.4 C)  TempSrc:  Oral Oral Oral  SpO2: 94% 95% 96% 96%  Weight:      Height:  Intake/Output Summary (Last 24 hours) at 04/25/2021 1721 Last data filed at 04/25/2021 0954 Gross per 24 hour  Intake 1404.02 ml  Output --  Net 1404.02 ml   Filed Weights   04/23/21 0620 04/24/21 0628 04/25/21 0511  Weight: 91.2 kg 93.4 kg 93.5 kg    Physical Exam:  General exam: awake, alert, no acute distress Respiratory system: lungs clear b/l, normal respiratory effort. Cardiovascular system: normal S1/S2, tachycardic, regular rhythm, no pedal edema.   Central nervous system: A&O x3. no gross focal neurologic deficits, normal speech Extremities: moves all, no edema, normal tone Psychiatry: normal mood, congruent affect, judgement  and insight appear normal  Labs   Data Reviewed: I have personally reviewed following labs and imaging studies  CBC: Recent Labs  Lab 04/20/21 1853 04/22/21 0134 04/25/21 0436  WBC 8.3 8.0 6.7  HGB 17.8* 17.8* 16.7  HCT 55.0* 56.0* 49.9  MCV 91.8 93.0 93.1  PLT 174 168 664   Basic Metabolic Panel: Recent Labs  Lab 04/21/21 0248 04/22/21 0134 04/23/21 0206 04/24/21 0207 04/25/21 0436  NA 140 140 138 139 140  K 3.9 4.3 3.7 3.8 4.0  CL 108 104 106 110 110  CO2 25 31 26 24 24   GLUCOSE 104* 85 107* 135* 106*  BUN 16 21 23 21 17   CREATININE 1.06 1.38* 1.31* 1.13 1.07  CALCIUM 8.8* 9.4 8.8* 8.5* 8.5*  MG 2.2 2.3 2.2 2.1 2.2   GFR: Estimated Creatinine Clearance: 74.1 mL/min (by C-G formula based on SCr of 1.07 mg/dL). Liver Function Tests: Recent Labs  Lab 04/18/21 2238  AST 15  ALT 13  ALKPHOS 71  BILITOT 0.5  PROT 5.3*  ALBUMIN 3.0*   No results for input(s): LIPASE, AMYLASE in the last 168 hours. No results for input(s): AMMONIA in the last 168 hours. Coagulation Profile: No results for input(s): INR, PROTIME in the last 168 hours.  Cardiac Enzymes: No results for input(s): CKTOTAL, CKMB, CKMBINDEX, TROPONINI in the last 168 hours. BNP (last 3 results) No results for input(s): PROBNP in the last 8760 hours. HbA1C: No results for input(s): HGBA1C in the last 72 hours. CBG: Recent Labs  Lab 04/24/21 1708 04/24/21 2118 04/25/21 0731 04/25/21 1113 04/25/21 1635  GLUCAP 169* 184* 120* 167* 167*   Lipid Profile: No results for input(s): CHOL, HDL, LDLCALC, TRIG, CHOLHDL, LDLDIRECT in the last 72 hours. Thyroid Function Tests: No results for input(s): TSH, T4TOTAL, FREET4, T3FREE, THYROIDAB in the last 72 hours. Anemia Panel: No results for input(s): VITAMINB12, FOLATE, FERRITIN, TIBC, IRON, RETICCTPCT in the last 72 hours. Sepsis Labs: No results for input(s): PROCALCITON, LATICACIDVEN in the last 168 hours.  Recent Results (from the past 240  hour(s))  Resp Panel by RT-PCR (Flu A&B, Covid) Nasopharyngeal Swab     Status: None   Collection Time: 04/17/21  1:00 PM   Specimen: Nasopharyngeal Swab; Nasopharyngeal(NP) swabs in vial transport medium  Result Value Ref Range Status   SARS Coronavirus 2 by RT PCR NEGATIVE NEGATIVE Final    Comment: (NOTE) SARS-CoV-2 target nucleic acids are NOT DETECTED.  The SARS-CoV-2 RNA is generally detectable in upper respiratory specimens during the acute phase of infection. The lowest concentration of SARS-CoV-2 viral copies this assay can detect is 138 copies/mL. A negative result does not preclude SARS-Cov-2 infection and should not be used as the sole basis for treatment or other patient management decisions. A negative result may occur with  improper specimen collection/handling, submission of specimen other than nasopharyngeal swab,  presence of viral mutation(s) within the areas targeted by this assay, and inadequate number of viral copies(<138 copies/mL). A negative result must be combined with clinical observations, patient history, and epidemiological information. The expected result is Negative.  Fact Sheet for Patients:  EntrepreneurPulse.com.au  Fact Sheet for Healthcare Providers:  IncredibleEmployment.be  This test is no t yet approved or cleared by the [place] FDA and  has been authorized for detection and/or diagnosis of SARS-CoV-2 by FDA under an Emergency Use Authorization (EUA). This EUA will remain  in effect (meaning this test can be used) for the duration of the COVID-19 declaration under Section 564(b)(1) of the Act, 21 U.S.C.section 360bbb-3(b)(1), unless the authorization is terminated  or revoked sooner.       Influenza A by PCR NEGATIVE NEGATIVE Final   Influenza B by PCR NEGATIVE NEGATIVE Final    Comment: (NOTE) The Xpert Xpress SARS-CoV-2/FLU/RSV plus assay is intended as an aid in the diagnosis of influenza from  Nasopharyngeal swab specimens and should not be used as a sole basis for treatment. Nasal washings and aspirates are unacceptable for Xpert Xpress SARS-CoV-2/FLU/RSV testing.  Fact Sheet for Patients: EntrepreneurPulse.com.au  Fact Sheet for Healthcare Providers: IncredibleEmployment.be  This test is not yet approved or cleared by the [place] FDA and has been authorized for detection and/or diagnosis of SARS-CoV-2 by FDA under an Emergency Use Authorization (EUA). This EUA will remain in effect (meaning this test can be used) for the duration of the COVID-19 declaration under Section 564(b)(1) of the Act, 21 U.S.C. section 360bbb-3(b)(1), unless the authorization is terminated or revoked.  Performed at [facility], [place] [address] [place], [place] [address]   MRSA Next Gen by PCR, Nasal     Status: None   Collection Time: 04/17/21  4:21 PM   Specimen: Nasal Mucosa; Nasal Swab  Result Value Ref Range Status   MRSA by PCR Next Gen NOT DETECTED NOT DETECTED Final    Comment: (NOTE) The GeneXpert MRSA Assay (FDA approved for NASAL specimens only), is one component of a comprehensive MRSA colonization surveillance program. It is not intended to diagnose MRSA infection nor to guide or monitor treatment for MRSA infections. Test performance is not FDA approved in patients less than 73 years old. Performed at [facility], [place] [address], [place], [place] [address]       Imaging Studies   DG Chest [place] 1 View  Result Date: 04/25/2021 CLINICAL DATA:  70 year old male with enlarging left upper lobe pulmonary nodule EXAM: PORTABLE CHEST 1 VIEW COMPARISON:  Prior chest x-ray 04/17/2021 FINDINGS: Left subclavian approach cardiac rhythm maintenance device. Leads project over the right atrium and right ventricle. Cardiac and mediastinal contours are within normal limits. Subtle nodular opacity projects over the left mid lung partially  obscured by the generator of the cardiac rhythm maintenance device. The nodule measures approximately 2.5 cm. No pneumothorax or pleural effusion. No acute osseous abnormality. IMPRESSION: 1. No active disease. 2. Known left upper lobe pulmonary nodule partially obscured by the overlying generator from the cardiac rhythm maintenance device. The nodule measures approximately 2.5 cm. Electronically Signed   By: Jacqulynn Cadet M.D.   On: 04/25/2021 08:07     Medications   Scheduled Meds:  budesonide  2 mL Inhalation BID   Chlorhexidine Gluconate Cloth  6 each Topical Daily   docusate sodium  100 mg Oral BID   empagliflozin  25 mg Oral Daily   insulin aspart  0-5  Units Subcutaneous QHS   insulin aspart  0-9 Units Subcutaneous TID WC   insulin detemir  46 Units Subcutaneous Daily   metoprolol tartrate  50 mg Oral BID   mexiletine  300 mg Oral Q12H   montelukast  10 mg Oral Daily   polycarbophil  625 mg Oral Daily   rosuvastatin  40 mg Oral QHS   sacubitril-valsartan  1 tablet Oral BID   spironolactone  25 mg Oral Daily   Continuous Infusions:  sodium chloride     amiodarone 60 mg/hr (04/25/21 1434)   tirofiban 0.15 mcg/kg/min (04/25/21 1436)       LOS: 8 days    Time spent: 25 minutes with > 50% spent at bedside and in coordination of care     Ezekiel Slocumb, DO [facility]  04/25/2021, 5:21 PM      If 7PM-7AM, please contact night-coverage. How to contact the [facility] Attending or Consulting provider [place] or covering provider during after hours [place], for this patient?    Check the care team in [facility] and look for a) attending/consulting [facility] provider listed and b) the [facility] team listed Log into [email or web address] and use [place]'s universal password to access. If you do not have the password, please contact the hospital operator. Locate the [facility] provider you are looking for under [facility] and page to a number that you can be directly reached. If you still  have difficulty reaching the provider, please page the [facility] (Director on Call) for the Hospitalists listed on amion for assistance.

## 2021-04-25 NOTE — Progress Notes (Addendum)
Back in VT, 122bpm Patient had a moment of slight sense of fluttering that was fleeting No CP, unaware of rhythm current;y Will bolus his Amio and run it at 60. Hopefully will break  If not, will ask MD about threshold for pace termination   I have placed a call to Dr. Gardenia Phlegm, PA-C  ADDEND (late entry) Spoke to Dr. Curt Bears, planned to attempt to pace terminate with industry As I returned to update the patient on plans his heart rhythm had converted. Dr. Curt Bears notified Asked that palliative be recalled to the case given pt no longer wants to pursue lung biopsy and our difficulty with rhythm management  I have called palliative and left a message to call me requesting revisit with Mr. Kwabena Strutz, PA-C

## 2021-04-25 NOTE — Progress Notes (Signed)
   04/25/21 0912  Assess: MEWS Score  BP (!) 141/81  Pulse Rate (!) 123  ECG Heart Rate (!) 123  SpO2 95 %  Assess: MEWS Score  MEWS Temp 0  MEWS Systolic 0  MEWS Pulse 2  MEWS RR 0  MEWS LOC 0  MEWS Score 2  MEWS Score Color Yellow  Assess: if the MEWS score is Yellow or Red  Were vital signs taken at a resting state? Yes  Focused Assessment Change from prior assessment (see assessment flowsheet) (sustaining in VT)  Early Detection of Sepsis Score *See Row Information* Low  MEWS guidelines implemented *See Row Information* Yes  Treat  MEWS Interventions Administered scheduled meds/treatments;Administered prn meds/treatments;Escalated (See documentation below)  Pain Scale 0-10  Pain Score 0  Take Vital Signs  Increase Vital Sign Frequency  Yellow: Q 2hr X 2 then Q 4hr X 2, if remains yellow, continue Q 4hrs  Escalate  MEWS: Escalate Yellow: discuss with charge nurse/RN and consider discussing with provider and RRT  Notify: Charge Nurse/RN  Name of Charge Nurse/RN Notified Wallene Dales RN  Date Charge Nurse/RN Notified 04/25/21  Time Charge Nurse/RN Notified 0912  Notify: Provider  Provider Name/Title Tommye Standard PA  Date Provider Notified 04/25/21  Time Provider Notified [phone]  Notification Type Face-to-face  Notification Reason Change in status  Provider response At bedside  Date of Provider Response 04/25/21  Time of Provider Response [phone]  Document  Patient Outcome Not stable and remains on department (patient sustaining in VT at this time; patient currently asymptomatic other than feeling occasional palpitations and mild tightness in chest/throat)  Progress note created (see row info) Yes

## 2021-04-25 NOTE — Care Management Important Message (Signed)
Important Message  Patient Details  Name: KEDAR SEDANO MRN: [id number] Date of Birth: 1951-01-01   Medicare Important Message Given:  Yes     Shelda Altes 04/25/2021, 10:33 AM

## 2021-04-25 NOTE — Progress Notes (Signed)
Pt stated he was feeling funny and having palpitations Vitals stable. EKG done. Notified charge nurse.

## 2021-04-26 ENCOUNTER — Encounter (HOSPITAL_COMMUNITY)
Admission: EM | Disposition: A | Discharge status: Home / Self Care | Payer: Self-pay | Source: Home / Self Care | Attending: Internal Medicine

## 2021-04-26 ENCOUNTER — Inpatient Hospital Stay (HOSPITAL_COMMUNITY): Payer: Medicare Other

## 2021-04-26 ENCOUNTER — Inpatient Hospital Stay (HOSPITAL_COMMUNITY): Payer: Medicare Other | Admitting: Anesthesiology

## 2021-04-26 ENCOUNTER — Encounter (HOSPITAL_COMMUNITY): Payer: Self-pay | Admitting: Internal Medicine

## 2021-04-26 ENCOUNTER — Inpatient Hospital Stay (HOSPITAL_COMMUNITY): Admission: RE | Admit: 2021-04-26 | Payer: Medicare Other | Source: Home / Self Care | Admitting: Pulmonary Disease

## 2021-04-26 DIAGNOSIS — C3490 Malignant neoplasm of unspecified part of unspecified bronchus or lung: Secondary | ICD-10-CM

## 2021-04-26 DIAGNOSIS — R911 Solitary pulmonary nodule: Secondary | ICD-10-CM | POA: Diagnosis not present

## 2021-04-26 HISTORY — PX: VIDEO BRONCHOSCOPY WITH RADIAL ENDOBRONCHIAL ULTRASOUND: SHX6849

## 2021-04-26 HISTORY — PX: BRONCHIAL BRUSHINGS: SHX5108

## 2021-04-26 HISTORY — PX: FIDUCIAL MARKER PLACEMENT: SHX6858

## 2021-04-26 HISTORY — PX: VIDEO BRONCHOSCOPY WITH ENDOBRONCHIAL NAVIGATION: SHX6175

## 2021-04-26 HISTORY — DX: Malignant neoplasm of unspecified part of unspecified bronchus or lung: C34.90

## 2021-04-26 HISTORY — PX: BRONCHIAL BIOPSY: SHX5109

## 2021-04-26 HISTORY — PX: BRONCHIAL NEEDLE ASPIRATION BIOPSY: SHX5106

## 2021-04-26 LAB — BASIC METABOLIC PANEL
Anion gap: 6 (ref 5–15)
BUN: 18 mg/dL (ref 8–23)
CO2: 26 mmol/L (ref 22–32)
Calcium: 8.5 mg/dL — ABNORMAL LOW (ref 8.9–10.3)
Chloride: 108 mmol/L (ref 98–111)
Creatinine, Ser: 1.39 mg/dL — ABNORMAL HIGH (ref 0.61–1.24)
GFR, Estimated: 54 mL/min — ABNORMAL LOW (ref >60)
Glucose, Bld: 116 mg/dL — ABNORMAL HIGH (ref 70–99)
Potassium: 4 mmol/L (ref 3.5–5.1)
Sodium: 140 mmol/L (ref 135–145)

## 2021-04-26 LAB — MAGNESIUM: Magnesium: 2.1 mg/dL (ref 1.7–2.4)

## 2021-04-26 LAB — GLUCOSE, CAPILLARY
Glucose-Capillary: 117 mg/dL — ABNORMAL HIGH (ref 70–99)
Glucose-Capillary: 110 mg/dL — ABNORMAL HIGH (ref 70–99)
Glucose-Capillary: 91 mg/dL (ref 70–99)
Glucose-Capillary: 109 mg/dL — ABNORMAL HIGH (ref 70–99)
Glucose-Capillary: 260 mg/dL — ABNORMAL HIGH (ref 70–99)

## 2021-04-26 SURGERY — VIDEO BRONCHOSCOPY WITH ENDOBRONCHIAL NAVIGATION
Anesthesia: General | Laterality: Left

## 2021-04-26 MED ORDER — ONDANSETRON HCL 4 MG/2ML IJ SOLN
INTRAMUSCULAR | Status: DC | PRN
  Administered 2021-04-26: 4 mg via INTRAVENOUS

## 2021-04-26 MED ORDER — PROPOFOL 10 MG/ML IV BOLUS
INTRAVENOUS | Status: DC | PRN
  Administered 2021-04-26: 170 mg via INTRAVENOUS

## 2021-04-26 MED ORDER — LIDOCAINE 2% (20 MG/ML) 5 ML SYRINGE
INTRAMUSCULAR | Status: DC | PRN
  Administered 2021-04-26: 60 mg via INTRAVENOUS

## 2021-04-26 MED ORDER — PHENYLEPHRINE 40 MCG/ML (10ML) SYRINGE FOR IV PUSH (FOR BLOOD PRESSURE SUPPORT)
PREFILLED_SYRINGE | INTRAVENOUS | Status: DC | PRN
  Administered 2021-04-26 (×3): 80 ug via INTRAVENOUS

## 2021-04-26 MED ORDER — PHENYLEPHRINE HCL-NACL 20-0.9 MG/250ML-% IV SOLN
INTRAVENOUS | Status: DC | PRN
  Administered 2021-04-26: 50 ug/min via INTRAVENOUS

## 2021-04-26 MED ORDER — FENTANYL CITRATE (PF) 100 MCG/2ML IJ SOLN
INTRAMUSCULAR | Status: AC
  Filled 2021-04-26: qty 2

## 2021-04-26 MED ORDER — MIDAZOLAM HCL 2 MG/2ML IJ SOLN
INTRAMUSCULAR | Status: DC | PRN
  Administered 2021-04-26 (×2): 1 mg via INTRAVENOUS

## 2021-04-26 MED ORDER — DEXAMETHASONE SODIUM PHOSPHATE 10 MG/ML IJ SOLN
INTRAMUSCULAR | Status: DC | PRN
  Administered 2021-04-26: 10 mg via INTRAVENOUS

## 2021-04-26 MED ORDER — SUGAMMADEX SODIUM 200 MG/2ML IV SOLN
INTRAVENOUS | Status: DC | PRN
  Administered 2021-04-26: 200 mg via INTRAVENOUS

## 2021-04-26 MED ORDER — CLOPIDOGREL BISULFATE 75 MG PO TABS
75.0000 mg | ORAL_TABLET | Freq: Every day | ORAL | Status: DC
  Administered 2021-04-27 – 2021-04-29 (×3): 75 mg via ORAL
  Filled 2021-04-26 (×5): qty 1

## 2021-04-26 MED ORDER — FENTANYL CITRATE (PF) 100 MCG/2ML IJ SOLN: 25.0000 ug | INTRAMUSCULAR | Status: DC | PRN

## 2021-04-26 MED ORDER — SUCCINYLCHOLINE CHLORIDE 200 MG/10ML IV SOSY
PREFILLED_SYRINGE | INTRAVENOUS | Status: DC | PRN
  Administered 2021-04-26: 100 mg via INTRAVENOUS

## 2021-04-26 MED ORDER — ROCURONIUM BROMIDE 10 MG/ML (PF) SYRINGE
PREFILLED_SYRINGE | INTRAVENOUS | Status: DC | PRN
  Administered 2021-04-26: 40 mg via INTRAVENOUS

## 2021-04-26 MED ORDER — CLOPIDOGREL BISULFATE 75 MG PO TABS
600.0000 mg | ORAL_TABLET | Freq: Once | ORAL | Status: AC
  Administered 2021-04-26: 600 mg via ORAL
  Filled 2021-04-26: qty 8

## 2021-04-26 MED ORDER — CHLORHEXIDINE GLUCONATE 0.12 % MT SOLN
OROMUCOSAL | Status: AC
  Filled 2021-04-26: qty 15

## 2021-04-26 MED ORDER — ONDANSETRON HCL 4 MG/2ML IJ SOLN: 4.0000 mg | Freq: Once | INTRAMUSCULAR | Status: DC | PRN

## 2021-04-26 MED ORDER — CHLORHEXIDINE GLUCONATE 0.12 % MT SOLN
15.0000 mL | Freq: Once | OROMUCOSAL | Status: DC
  Filled 2021-04-26: qty 15

## 2021-04-26 MED ORDER — FENTANYL CITRATE (PF) 100 MCG/2ML IJ SOLN
INTRAMUSCULAR | Status: DC | PRN
  Administered 2021-04-26 (×2): 50 ug via INTRAVENOUS

## 2021-04-26 MED ORDER — PROPOFOL 10 MG/ML IV BOLUS
INTRAVENOUS | Status: AC
  Filled 2021-04-26: qty 40

## 2021-04-26 MED ORDER — LACTATED RINGERS IV SOLN: INTRAVENOUS | Status: DC

## 2021-04-26 SURGICAL SUPPLY — 45 items
ADAPTER BRONCH F/PENTAX (ADAPTER) ×3 IMPLANT
ADAPTER VALVE BIOPSY EBUS (MISCELLANEOUS) IMPLANT
ADPTR VALVE BIOPSY EBUS (MISCELLANEOUS)
BRUSH CYTOL CELLEBRITY 1.5X140 (MISCELLANEOUS) ×3 IMPLANT
BRUSH SUPERTRAX BIOPSY (INSTRUMENTS) IMPLANT
BRUSH SUPERTRAX NDL-TIP CYTO (INSTRUMENTS) ×3 IMPLANT
CANISTER SUCT 3000ML PPV (MISCELLANEOUS) ×3 IMPLANT
CHANNEL WORK EXTEND EDGE 180 (KITS) IMPLANT
CHANNEL WORK EXTEND EDGE 45 (KITS) IMPLANT
CHANNEL WORK EXTEND EDGE 90 (KITS) IMPLANT
CONT SPEC 4OZ CLIKSEAL STRL BL (MISCELLANEOUS) ×3 IMPLANT
COVER BACK TABLE 60X90IN (DRAPES) ×3 IMPLANT
FILTER STRAW FLUID ASPIR (MISCELLANEOUS) IMPLANT
FORCEPS BIOP SUPERTRX PREMAR (INSTRUMENTS) ×3 IMPLANT
GAUZE SPONGE 4X4 12PLY STRL (GAUZE/BANDAGES/DRESSINGS) ×3 IMPLANT
GLOVE SURG SS PI 7.5 STRL IVOR (GLOVE) ×6 IMPLANT
GOWN STRL REUS W/ TWL LRG LVL3 (GOWN DISPOSABLE) ×4 IMPLANT
GOWN STRL REUS W/TWL LRG LVL3 (GOWN DISPOSABLE) ×6
KIT CLEAN ENDO COMPLIANCE (KITS) ×3 IMPLANT
KIT LOCATABLE GUIDE (CANNULA) IMPLANT
KIT MARKER FIDUCIAL DELIVERY (KITS) IMPLANT
KIT PROCEDURE EDGE 180 (KITS) IMPLANT
KIT PROCEDURE EDGE 45 (KITS) IMPLANT
KIT PROCEDURE EDGE 90 (KITS) IMPLANT
KIT TURNOVER KIT B (KITS) ×3 IMPLANT
MARKER SKIN DUAL TIP RULER LAB (MISCELLANEOUS) ×3 IMPLANT
NEEDLE SUPERTRX PREMARK BIOPSY (NEEDLE) ×3 IMPLANT
NS IRRIG 1000ML POUR BTL (IV SOLUTION) ×3 IMPLANT
OIL SILICONE PENTAX (PARTS (SERVICE/REPAIRS)) ×3 IMPLANT
PAD ARMBOARD 7.5X6 YLW CONV (MISCELLANEOUS) ×6 IMPLANT
PATCHES PATIENT (LABEL) ×9 IMPLANT
SOL ANTI FOG 6CC (MISCELLANEOUS) ×2 IMPLANT
SOLUTION ANTI FOG 6CC (MISCELLANEOUS) ×1
SYR 20CC LL (SYRINGE) ×3 IMPLANT
SYR 20ML ECCENTRIC (SYRINGE) ×3 IMPLANT
SYR 50ML SLIP (SYRINGE) ×3 IMPLANT
TOWEL OR 17X24 6PK STRL BLUE (TOWEL DISPOSABLE) ×3 IMPLANT
TRAP SPECIMEN MUCOUS 40CC (MISCELLANEOUS) IMPLANT
TUBE CONNECTING 20X1/4 (TUBING) ×3 IMPLANT
UNDERPAD 30X30 (UNDERPADS AND DIAPERS) ×3 IMPLANT
VALVE BIOPSY  SINGLE USE (MISCELLANEOUS) ×3
VALVE BIOPSY SINGLE USE (MISCELLANEOUS) ×2 IMPLANT
VALVE SUCTION BRONCHIO DISP (MISCELLANEOUS) ×3 IMPLANT
WATER STERILE IRR 1000ML POUR (IV SOLUTION) ×3 IMPLANT
superlock fiducial marker ×3 IMPLANT

## 2021-04-26 NOTE — Transfer of Care (Signed)
Immediate Anesthesia Transfer of Care Note  Patient: John [name]  Procedure(s) Performed: VIDEO BRONCHOSCOPY WITH ENDOBRONCHIAL NAVIGATION (Left) BRONCHIAL BIOPSIES BRONCHIAL NEEDLE ASPIRATION BIOPSIES BRONCHIAL BRUSHINGS RADIAL ENDOBRONCHIAL ULTRASOUND  Patient Location: PACU  Anesthesia Type:General  Level of Consciousness: drowsy and patient cooperative  Airway & Oxygen Therapy: Patient Spontanous Breathing and Patient connected to nasal cannula oxygen  Post-op Assessment: Report given to RN and Post -op Vital signs reviewed and stable  Post vital signs: Reviewed and stable  Last Vitals:  Vitals Value Taken Time  BP    Temp    Pulse    Resp 18 04/26/21 1502  SpO2    Vitals shown include unvalidated device data.  Last Pain:  Vitals:   04/26/21 1249  TempSrc:   PainSc: 0-No pain      Patients Stated Pain Goal: 0 (96/75/91 6384)  Complications: No notable events documented.

## 2021-04-26 NOTE — Progress Notes (Signed)
PCCM:  Can restart antiplatelet per EP.  He will need outpatient radiation oncology evaluation pending path.   I called and spoke with his wife.   Garner Nash, DO [facility] 04/26/2021 6:14 PM

## 2021-04-26 NOTE — Anesthesia Procedure Notes (Signed)
Procedure Name: Intubation Date/Time: 04/26/2021 1:55 PM Performed by: Janene Harvey, CRNA Pre-anesthesia Checklist: Patient identified, Emergency Drugs available, Suction available and Patient being monitored Patient Re-evaluated:Patient Re-evaluated prior to induction Oxygen Delivery Method: Circle system utilized Preoxygenation: Pre-oxygenation with 100% oxygen Induction Type: IV induction and Rapid sequence Ventilation: Mask ventilation without difficulty Laryngoscope Size: Mac and 4 Grade View: Grade I Tube type: Oral Tube size: 8.5 mm Number of attempts: 1 Airway Equipment and Method: Stylet and Oral airway Placement Confirmation: ETT inserted through vocal cords under direct vision, positive ETCO2 and breath sounds checked- equal and bilateral Secured at: 23 cm Tube secured with: Tape Dental Injury: Teeth and Oropharynx as per pre-operative assessment

## 2021-04-26 NOTE — Progress Notes (Signed)
Palliative:  Received call requesting follow up 11/7 as patient was declining lung biopsy. Per chart review and discussion with RN this AM, he is now agreeable to lung biopsy today. We will shadow chart and follow up with [name] later this week.  John Burrow, John [name], John [name] Palliative Medicine Team Team Phone # [phone]  Pager # [phone]   NO CHARGE

## 2021-04-26 NOTE — Op Note (Signed)
Video Bronchoscopy with Robotic Assisted Bronchoscopic Navigation with fiducial placement  Date of Operation: 04/26/2021   Pre-op Diagnosis: Left upper lobe nodule  Post-op Diagnosis: Left upper lobe nodule  Surgeon: Garner Nash, DO   Assistants: None   Anesthesia: General endotracheal anesthesia  Operation: Flexible video fiberoptic bronchoscopy with robotic assistance and biopsies.  Estimated Blood Loss: Minimal  Complications: None  Indications and History: John [name] is a 70 y.o. male with history of enlarging left upper lobe pulmonary nodule. The risks, benefits, complications, treatment options and expected outcomes were discussed with the patient.  The possibilities of pneumothorax, pneumonia, reaction to medication, pulmonary aspiration, perforation of a viscus, bleeding, failure to diagnose a condition and creating a complication requiring transfusion or operation were discussed with the patient who freely signed the consent.    Description of Procedure: The patient was seen in the Preoperative Area, was examined and was deemed appropriate to proceed.  The patient was taken to [facility] endoscopy room 3, identified as John [name] and the procedure verified as Flexible Video Fiberoptic Bronchoscopy.  A Time Out was held and the above information confirmed.   Prior to the date of the procedure a high-resolution CT scan of the chest was performed. Utilizing ION software program a virtual tracheobronchial tree was generated to allow the creation of distinct navigation pathways to the patient's parenchymal abnormalities. After being taken to the operating room general anesthesia was initiated and the patient  was orally intubated. The video fiberoptic bronchoscope was introduced via the endotracheal tube and a general inspection was performed which showed normal right and left lung anatomy, aspiration of the bilateral mainstems was completed to remove any remaining secretions.  Robotic catheter inserted into patient's endotracheal tube.   Target #1 left upper lobe: The distinct navigation pathways prepared prior to this procedure were then utilized to navigate to patient's lesion identified on CT scan. The robotic catheter was secured into place and the vision probe was withdrawn.  Lesion location was approximated using fluoroscopy and radial endobronchial ultrasound for peripheral targeting.  Lesion within the left upper lobe was located and catheter was directed towards lesion under three-dimensional CBCT guidance. Under fluoroscopic guidance transbronchial needle brushings, transbronchial needle biopsies, and transbronchial forceps biopsies were performed to be sent for cytology and pathology.  After tissue sampling a fiducial was placed under fluoroscopic guidance with the fiducial catheter wire and delivery kit.  At the end of the procedure a general airway inspection was performed and there was no evidence of active bleeding. The bronchoscope was removed.  The patient tolerated the procedure well. There was no significant blood loss and there were no obvious complications. A post-procedural chest x-ray is pending.  Samples Target #1: 1. Transbronchial needle brushings from left upper lobe 2. Transbronchial Wang needle biopsies from left upper lobe 3. Transbronchial forceps biopsies from left upper lobe  Plans:  The patient will be discharged from the PACU to home when recovered from anesthesia and after chest x-ray is reviewed. We will review the cytology, pathology and microbiology results with the patient when they become available. Outpatient followup will be with Garner Nash, DO.   Garner Nash, DO [place] [facility] 04/26/2021 6:10 PM

## 2021-04-26 NOTE — Anesthesia Preprocedure Evaluation (Addendum)
Anesthesia Evaluation  Patient identified by MRN, date of birth, ID band Patient awake    Reviewed: Allergy & Precautions, Patient's Chart, lab work & pertinent test results, reviewed documented beta blocker date and time   Airway Mallampati: II  TM Distance: >3 FB Neck ROM: Full    Dental no notable dental hx.    Pulmonary shortness of breath and with exertion, sleep apnea and Continuous Positive Airway Pressure Ventilation , COPD,  COPD inhaler, former smoker,  Lung nodule Former smoker, 50 pack year history, quit smoking 2018   Pulmonary exam normal        Cardiovascular hypertension, Pt. on medications and Pt. on home beta blockers + CAD (extensive CAD), + Past MI, + Cardiac Stents and +CHF (LVEF 25-30%)  + dysrhythmias Atrial Fibrillation and Ventricular Tachycardia + Cardiac Defibrillator (2022)  Rhythm:Irregular Rate:Normal  Echo 04/18/21: 1. Left ventricular ejection fraction, by estimation, is 25 to 30%. Left  ventricular ejection fraction by 3D volume is 27 %. The left ventricle has  severely decreased function. The left ventricle demonstrates regional wall  motion abnormalities (see  scoring diagram/findings for description). The left ventricular internal  cavity size was moderately dilated. Left ventricular diastolic parameters  are indeterminate. Elevated left ventricular end-diastolic pressure. There  is severe hypokinesis of the  left ventricular, basal-mid inferoseptal wall and lateral wall. There is  akinesis of the left ventricular, basal-mid inferior wall and  inferolateral wall.  2. Right ventricular systolic function is normal. The right ventricular  size is mildly enlarged. There is normal pulmonary artery systolic  pressure. The estimated right ventricular systolic pressure is 89.3 mmHg.  3. The mitral valve is normal in structure. Mild mitral valve  regurgitation. No evidence of mitral stenosis.  4.  The aortic valve is normal in structure. Aortic valve regurgitation is  not visualized. No aortic stenosis is present.  5. Aortic dilatation noted. There is mild dilatation of the aortic root,  measuring 38 mm. There is mild dilatation of the ascending aorta,  measuring 43 mm.  6. The inferior vena cava is dilated in size with <50% respiratory  variability, suggesting right atrial pressure of 15 mmHg.  7. Compared to study dated 02/15/2021, LVF has declined    Cath 01/2021: Acute coronary syndrome secondary to total occlusion of a large distal right coronary artery at a site of prior distal stenting.  The LAD has mild irregularity without significant disease.  The large ramus intermediate vessel has previously noted 55% very proximal stenosis not significantly changed.  The AV groove circumflex is a small vessel.  The RCA is a very large vessel with diffuse stents proximally to very distally.  There is proximal 50% stenosis prior to the proximal to mid stent.  There is diffuse 35% narrowing within the long proximal stent.  The distal stent is totally occluded just after PDA vessel.  Difficult but successful PCI requiring PTCA, a 3.5 x 15 mm Scorflex multiple dilatations throughout the previously placed tandem 3.5 mm  distal stents with ultimate noncompliant balloon dilatation to approximately 3.6 mm and insertion of a 2.75 x 22 mm Onyx Frontier new stent distal to the distal stent placed in overlap 75 to 80% stenosis beyond the stented segment, postdilated with a 3 oh balloon with residual narrowing of 0%.    Neuro/Psych  Neuromuscular disease (L foot drop 2/2 prior back surgery) CVA negative psych ROS   GI/Hepatic Neg liver ROS, GERD  ,  Endo/Other  diabetes, Well Controlled, Type 2,  Insulin Dependenta1c 6.9  Renal/GU Renal InsufficiencyRenal diseaseCr 1.39  negative genitourinary   Musculoskeletal negative musculoskeletal ROS (+)   Abdominal Normal abdominal exam  (+)    Peds  Hematology negative hematology ROS (+) hct 49.9   Anesthesia Other Findings   Reproductive/Obstetrics negative OB ROS                                                             Anesthesia Evaluation  Patient identified by MRN, date of birth, ID band Patient awake    Reviewed: Allergy & Precautions, NPO status , Patient's Chart, lab work & pertinent test results  Airway Mallampati: II  TM Distance: >3 FB Neck ROM: Full    Dental  (+) Teeth Intact   Pulmonary sleep apnea and Continuous Positive Airway Pressure Ventilation , COPD,  COPD inhaler, former smoker,    Pulmonary exam normal        Cardiovascular hypertension, Pt. on medications and Pt. on home beta blockers + CAD and + Past MI   Rhythm:Regular Rate:Normal     Neuro/Psych negative neurological ROS  negative psych ROS   GI/Hepatic Neg liver ROS, GERD  Medicated,  Endo/Other  diabetes, Type 2, Insulin Dependent  Renal/GU negative Renal ROS  negative genitourinary   Musculoskeletal negative musculoskeletal ROS (+)   Abdominal (+)  Abdomen: soft. Bowel sounds: normal.  Peds  Hematology negative hematology ROS (+)   Anesthesia Other Findings   Reproductive/Obstetrics                            Anesthesia Physical Anesthesia Plan  ASA: 3  Anesthesia Plan: General   Post-op Pain Management:    Induction: Intravenous  PONV Risk Score and Plan: 2 and Ondansetron, Dexamethasone and Treatment may vary due to age or medical condition  Airway Management Planned: Mask and Oral ETT  Additional Equipment: None  Intra-op Plan:   Post-operative Plan: Extubation in OR  Informed Consent: I have reviewed the patients History and Physical, chart, labs and discussed the procedure including the risks, benefits and alternatives for the proposed anesthesia with the patient or authorized representative who has indicated his/her  understanding and acceptance.     Dental advisory given  Plan Discussed with: CRNA  Anesthesia Plan Comments: (Lab Results      Component                Value               Date                      WBC                      8.6                 01/18/2021                HGB                      16.9                01/18/2021  HCT                      53.1 (H)            01/18/2021                MCV                      94.3                01/18/2021                PLT                      152                 01/18/2021           Lab Results      Component                Value               Date                      NA                       140                 01/19/2021                K                        4.0                 01/19/2021                CO2                      24                  01/19/2021                GLUCOSE                  133 (H)             01/19/2021                BUN                      16                  01/19/2021                CREATININE               0.90                01/19/2021                CALCIUM                  8.6 (L)             01/19/2021                GFRNONAA                 >  60                 01/19/2021                GFRAA                    >60                 02/28/2020            ECHO 08/22: IMPRESSIONS  1. Left ventricular ejection fraction, by estimation, is 40 to 45%. The  left ventricle has mildly decreased function. The left ventricle  demonstrates regional wall motion abnormalities (see scoring  diagram/findings for description). Left ventricular  diastolic parameters are consistent with Grade I diastolic dysfunction  (impaired relaxation). There is mild dyskinesis of the left ventricular,  basal inferior wall and inferolateral wall.  2. Right ventricular systolic function is normal. The right ventricular  size is normal. Tricuspid regurgitation signal is inadequate for assessing  PA pressure.  3.  Left atrial size was moderately dilated.  4. Right atrial size was mildly dilated.  5. The mitral valve is normal in structure. Mild mitral valve  regurgitation.  6. The aortic valve is tricuspid. Aortic valve regurgitation is not  visualized.  7. Aortic dilatation noted. There is mild dilatation of the aortic root,  measuring 40 mm. There is mild dilatation of the ascending aorta,  measuring 41 mm. )       Anesthesia Quick Evaluation  Anesthesia Physical Anesthesia Plan  ASA: 4  Anesthesia Plan: General   Post-op Pain Management:    Induction: Intravenous  PONV Risk Score and Plan: 2 and Ondansetron, Dexamethasone and Treatment may vary due to age or medical condition  Airway Management Planned: Oral ETT and Mask  Additional Equipment: None  Intra-op Plan:   Post-operative Plan: Extubation in OR  Informed Consent: I have reviewed the patients History and Physical, chart, labs and discussed the procedure including the risks, benefits and alternatives for the proposed anesthesia with the patient or authorized representative who has indicated his/her understanding and acceptance.     Dental advisory given  Plan Discussed with: CRNA  Anesthesia Plan Comments: (Lab Results      Component                Value               Date                      WBC                      6.7                 04/25/2021                HGB                      16.7                04/25/2021                HCT                      49.9                04/25/2021  MCV                      93.1                04/25/2021                PLT                      182                 04/25/2021           Lab Results      Component                Value               Date                      NA                       140                 04/26/2021                K                        4.0                 04/26/2021                CO2                      26                   04/26/2021                GLUCOSE                  116 (H)             04/26/2021                BUN                      18                  04/26/2021                CREATININE               1.39 (H)            04/26/2021                CALCIUM                  8.5 (L)             04/26/2021                EGFR                     93                  02/01/2021                GFRNONAA  54 (L)              04/26/2021          )      Anesthesia Quick Evaluation

## 2021-04-26 NOTE — Progress Notes (Signed)
Progress Note   Subjective   Feels much improved today.  No further ventricular tachycardia since yesterday morning.  Has been tolerating IV amiodarone.  Plan for lung biopsy today.  Inpatient Medications    Scheduled Meds:  budesonide  2 mL Inhalation BID   Chlorhexidine Gluconate Cloth  6 each Topical Daily   docusate sodium  100 mg Oral BID   empagliflozin  25 mg Oral Daily   insulin aspart  0-5 Units Subcutaneous QHS   insulin aspart  0-9 Units Subcutaneous TID WC   insulin detemir  46 Units Subcutaneous Daily   metoprolol tartrate  50 mg Oral BID   mexiletine  300 mg Oral Q12H   montelukast  10 mg Oral Daily   polycarbophil  625 mg Oral Daily   rosuvastatin  40 mg Oral QHS   sacubitril-valsartan  1 tablet Oral BID   spironolactone  25 mg Oral Daily   Continuous Infusions:  sodium chloride     amiodarone 60 mg/hr (04/26/21 0151)   PRN Meds: acetaminophen, albuterol, hydrALAZINE, hydrocortisone cream, nitroGLYCERIN, ondansetron (ZOFRAN) IV, zolpidem   Vital Signs    Vitals:   04/25/21 1957 04/25/21 2030 04/26/21 0028 04/26/21 0424  BP: 101/81 101/81 125/75 123/81  Pulse: 70 70 70 81  Resp: 18  18 17   Temp: 99 F (37.2 C)  99.2 F (37.3 C) (!) 97.4 F (36.3 C)  TempSrc: Oral  Oral Oral  SpO2: 96%  96%   Weight:    93.8 kg  Height:        Intake/Output Summary (Last 24 hours) at 04/26/2021 0811 Last data filed at 04/25/2021 0954 Gross per 24 hour  Intake 1164.02 ml  Output --  Net 1164.02 ml    Filed Weights   04/24/21 0628 04/25/21 0511 04/26/21 0424  Weight: 93.4 kg 93.5 kg 93.8 kg    Telemetry    Atrial paced-personally reviewed  Physical Exam    GEN: Well nourished, well developed, in no acute distress  HEENT: normal  Neck: no JVD, carotid bruits, or masses Cardiac: RRR; no murmurs, rubs, or gallops,no edema  Respiratory:  clear to auscultation bilaterally, normal work of breathing GI: soft, nontender, nondistended, + BS MS: no  deformity or atrophy  Skin: warm and dry, device site well healed Neuro:  Strength and sensation are intact Psych: euthymic mood, full affect    Labs    Chemistry Recent Labs  Lab 04/24/21 0207 04/25/21 0436 04/26/21 0218  NA 139 140 140  K 3.8 4.0 4.0  CL 110 110 108  CO2 24 24 26   GLUCOSE 135* 106* 116*  BUN 21 17 18   CREATININE 1.13 1.07 1.39*  CALCIUM 8.5* 8.5* 8.5*  GFRNONAA >60 >60 54*  ANIONGAP 5 6 6       Hematology Recent Labs  Lab 04/20/21 1853 04/22/21 0134 04/25/21 0436  WBC 8.3 8.0 6.7  RBC 5.99* 6.02* 5.36  HGB 17.8* 17.8* 16.7  HCT 55.0* 56.0* 49.9  MCV 91.8 93.0 93.1  MCH 29.7 29.6 31.2  MCHC 32.4 31.8 33.5  RDW 13.6 13.9 13.4  PLT 174 168 182      Patient ID  John [name] is a 70 y.o. male with a past medical history significant for CHF, CAD (02/14/21 with CP, NSTEMI, underwent cath and PCI  (Difficult for successful PTCA/PCI using 3.5 mm ScoreFlex balloon multiple inflations with post dilation of the previously stented) overlapping Onyx Frontier DES 2.75 mm x 22 mm placed overlapping distally);  prox RCA ~50%, prox-distal RCA 35%.  Unfortunately suffered a stroke), ILD, and VT s/p ablation. Lung mass  he was admitted again this admission for VT storm.    VT ablation 01/2021 At that time to distinct ventricular tachycardias were noted.  One was a posterolateral VT that was successfully ablated near the papillary muscle.  A second VT was not targeted for ablation but was described as a left superior axis VT   Device information MDT dual chamber ICD implanted 08/20/20 Secondary prevention w/hx of hemodynamically unstable MMVT   AAD hx 2018 Pulmonary function studies have shown restriction with low DLCO.  He felt possibly also to have asthma based on nitric oxide testing.  With his lung disease it has been recommended that amiodarone be considered for discontinuance Looks like amio was stopped in 2019 Follows with pulmonology March 2022  mexilletine > recurrent VT higher doses were too expensive June 2022 sotalol started Aug 2022 more VT and mexiletine resumed 01/18/21, VT ablation 02/09/21, device interrogation done for MRI noted VT treated with ATP and mexiletine ordered for increase to 300mg  BID >>>> THIS DID NOT GET TRANSLATED ON DISCHARGE AND remained at 116ms 02/25/21 VT storm,  mexiletine increased 04/17/21: VT, symptomatic/intolerable, amiodarone added, sotalol stopped  Assessment & Plan    1.  Ventricular tachycardia: Recurrent sustained VT yesterday.  He has been restarted on IV amiodarone.  Would continue throughout the day today.  We Asanti Craigo potentially switch to p.o. amiodarone tomorrow.  Keep potassium > 4, magnesium > 2.  2.  Chronic systolic heart failure due to ischemic cardiomyopathy: No ischemic symptoms.  Patient is euvolemic.  No changes.  3.  Lung mass: Plavix has been stopped and he is on Aggrastat.  Has plans for lung biopsy today.  Tamyah Cutbirth await recommendations from a pulmonary as to the timing of restarting his Plavix.   Janan Bogie M. Shiva Sahagian MD 04/26/2021 8:11 AM

## 2021-04-26 NOTE — Progress Notes (Signed)
Pt refusing cpap for the night. ?

## 2021-04-26 NOTE — Progress Notes (Signed)
PROGRESS NOTE    John [name]   HYQ:[id number]  DOB: 25-Jun-1950  PCP: [name], MD    DOA: 04/17/2021 LOS: 9    Brief Narrative / Hospital Course to Date:   70 year old male with past medical history significant for CHF, CAD, ILD, and VT s/p ablation and recurrent VT who presented on 04/17/2021 for sustained VT requiring cardioversion in the ER.  He was admitted on EP's service for management of his arrhythmia.    Cardiac rhythm has improved and patient being loaded with oral amiodarone.    Patient has a rapidly enlarging right upper lobe lung nodule that there was planned biopsy on Tuesday with Dr. Valeta Harms.  Due to a recent drug-eluting stent, patient requires DAPT.  He is being bridged with IV antiplatelet therapy and awaiting Plavix washout prior to bronchoscopy.  Assessment & Plan   Principal Problem:   Lung nodule Active Problems:   VT (ventricular tachycardia)   Recurrrent V-tach - Required cardioversion in the ED on admission. 11/7 - AM having sustained runs, HR in 120-130's during my encounter.  Given amio bolus and drip increased earlier per EP. 11/8 - NSR on monitor, VT resolved after sustained episode yesterday AM  --Mgmt per EP --On amiodarone gtt  --Goal K>4, Mg>2 --Telemetry --Continue mexiletine --No driving x 6 months  Ischemic cardiomyopathy / CAD - no current chest pain or ischemic symptoms.  Appears euvolemic. Monitor.  Mgmt per cardiology/EP. --Plavix on hold for bronch --Continue IV Aggrastat until 2 AM Tuesday 11/8 --Resume Plavix when okay with PCCM  RUL Lung Mass - Pulmomology plans to do bronchoscopy after Plavix wash out. --See PCCM recommendations --Incentive spirometer --Bronchoscopy planned today (11/8)  Hemorrhoids - fiber and preparation H  Allergies: on Singulair  Type 2 diabetes: sliding scale insulin  Patient BMI: Body mass index is 28.06 kg/m.   DVT prophylaxis: Place and maintain sequential compression device  Start: 04/19/21 1020 SCDs Start: 04/17/21 1501   Diet:  Diet Orders (From admission, onward)     Start     Ordered   04/20/21 0900  Diet Carb Modified Fluid consistency: Thin; Room service appropriate? Yes  Diet effective now       Question Answer Comment  Diet-HS Snack? Nothing   Calorie Level Medium 1600-2000   Fluid consistency: Thin   Room service appropriate? Yes      04/20/21 0859              Code Status: Full Code   Subjective 04/26/21    Pt feeling okay this AM.  Bronchoscopy with lung biopsy planned today.  His HR is better after runs of VT yesterday AM.  Overall feels better today and agreeable to the procedure.  No other acute complaints.     Disposition Plan & Communication   Status is: Inpatient  Remains inpatient appropriate because: ongoing diagnostic evaluation with bronchoscopy planned today. On IV therapies as above.  Consults, Procedures, Significant Events   Consultants:  EP PCCM  Procedures:  Cardioversion 04/17/21  Antimicrobials:  Anti-infectives (From admission, onward)    None         Micro    Objective   Vitals:   04/26/21 1515 04/26/21 1530 04/26/21 1545 04/26/21 1606  BP: 100/72 105/74 103/70 113/79  Pulse: 70 70 70 74  Resp: 17 20 17    Temp:  98.4 F (36.9 C) 98.4 F (36.9 C) 98.1 F (36.7 C)  TempSrc:    Oral  SpO2: 92%  93% 92%   Weight:      Height:        Intake/Output Summary (Last 24 hours) at 04/26/2021 1830 Last data filed at 04/26/2021 1504 Gross per 24 hour  Intake 700 ml  Output --  Net 700 ml   Filed Weights   04/24/21 0628 04/25/21 0511 04/26/21 0424  Weight: 93.4 kg 93.5 kg 93.8 kg    Physical Exam:  General exam: awake, alert, no acute distress Respiratory system: normal respiratory effort, on room air. Cardiovascular system: RRR, no pedal edema, NSR on monitor.   Central nervous system: A&O x3. no gross focal neurologic deficits, normal speech Extremities: moves all, no edema, normal  tone Psychiatry: normal mood, congruent affect, judgement and insight appear normal  Labs   Data Reviewed: I have personally reviewed following labs and imaging studies  CBC: Recent Labs  Lab 04/20/21 1853 04/22/21 0134 04/25/21 0436  WBC 8.3 8.0 6.7  HGB 17.8* 17.8* 16.7  HCT 55.0* 56.0* 49.9  MCV 91.8 93.0 93.1  PLT 174 168 284   Basic Metabolic Panel: Recent Labs  Lab 04/22/21 0134 04/23/21 0206 04/24/21 0207 04/25/21 0436 04/26/21 0218  NA 140 138 139 140 140  K 4.3 3.7 3.8 4.0 4.0  CL 104 106 110 110 108  CO2 31 26 24 24 26   GLUCOSE 85 107* 135* 106* 116*  BUN 21 23 21 17 18   CREATININE 1.38* 1.31* 1.13 1.07 1.39*  CALCIUM 9.4 8.8* 8.5* 8.5* 8.5*  MG 2.3 2.2 2.1 2.2 2.1   GFR: Estimated Creatinine Clearance: 57.1 mL/min (A) (by C-G formula based on SCr of 1.39 mg/dL (H)). Liver Function Tests: No results for input(s): AST, ALT, ALKPHOS, BILITOT, PROT, ALBUMIN in the last 168 hours.  No results for input(s): LIPASE, AMYLASE in the last 168 hours. No results for input(s): AMMONIA in the last 168 hours. Coagulation Profile: No results for input(s): INR, PROTIME in the last 168 hours.  Cardiac Enzymes: No results for input(s): CKTOTAL, CKMB, CKMBINDEX, TROPONINI in the last 168 hours. BNP (last 3 results) No results for input(s): PROBNP in the last 8760 hours. HbA1C: No results for input(s): HGBA1C in the last 72 hours. CBG: Recent Labs  Lab 04/25/21 2135 04/26/21 0910 04/26/21 1118 04/26/21 1503 04/26/21 1617  GLUCAP 171* 117* 110* 91 109*   Lipid Profile: No results for input(s): CHOL, HDL, LDLCALC, TRIG, CHOLHDL, LDLDIRECT in the last 72 hours. Thyroid Function Tests: No results for input(s): TSH, T4TOTAL, FREET4, T3FREE, THYROIDAB in the last 72 hours. Anemia Panel: No results for input(s): VITAMINB12, FOLATE, FERRITIN, TIBC, IRON, RETICCTPCT in the last 72 hours. Sepsis Labs: No results for input(s): PROCALCITON, LATICACIDVEN in the last 168  hours.  Recent Results (from the past 240 hour(s))  Resp Panel by RT-PCR (Flu A&B, Covid) Nasopharyngeal Swab     Status: None   Collection Time: 04/17/21  1:00 PM   Specimen: Nasopharyngeal Swab; Nasopharyngeal(NP) swabs in vial transport medium  Result Value Ref Range Status   SARS Coronavirus 2 by RT PCR NEGATIVE NEGATIVE Final    Comment: (NOTE) SARS-CoV-2 target nucleic acids are NOT DETECTED.  The SARS-CoV-2 RNA is generally detectable in upper respiratory specimens during the acute phase of infection. The lowest concentration of SARS-CoV-2 viral copies this assay can detect is 138 copies/mL. A negative result does not preclude SARS-Cov-2 infection and should not be used as the sole basis for treatment or other patient management decisions. A negative result may occur with  improper  specimen collection/handling, submission of specimen other than nasopharyngeal swab, presence of viral mutation(s) within the areas targeted by this assay, and inadequate number of viral copies(<138 copies/mL). A negative result must be combined with clinical observations, patient history, and epidemiological information. The expected result is Negative.  Fact Sheet for Patients:  EntrepreneurPulse.com.au  Fact Sheet for Healthcare Providers:  IncredibleEmployment.be  This test is no t yet approved or cleared by the [place] FDA and  has been authorized for detection and/or diagnosis of SARS-CoV-2 by FDA under an Emergency Use Authorization (EUA). This EUA will remain  in effect (meaning this test can be used) for the duration of the COVID-19 declaration under Section 564(b)(1) of the Act, 21 U.S.C.section 360bbb-3(b)(1), unless the authorization is terminated  or revoked sooner.       Influenza A by PCR NEGATIVE NEGATIVE Final   Influenza B by PCR NEGATIVE NEGATIVE Final    Comment: (NOTE) The Xpert Xpress SARS-CoV-2/FLU/RSV plus assay is intended  as an aid in the diagnosis of influenza from Nasopharyngeal swab specimens and should not be used as a sole basis for treatment. Nasal washings and aspirates are unacceptable for Xpert Xpress SARS-CoV-2/FLU/RSV testing.  Fact Sheet for Patients: EntrepreneurPulse.com.au  Fact Sheet for Healthcare Providers: IncredibleEmployment.be  This test is not yet approved or cleared by the [place] FDA and has been authorized for detection and/or diagnosis of SARS-CoV-2 by FDA under an Emergency Use Authorization (EUA). This EUA will remain in effect (meaning this test can be used) for the duration of the COVID-19 declaration under Section 564(b)(1) of the Act, 21 U.S.C. section 360bbb-3(b)(1), unless the authorization is terminated or revoked.  Performed at [facility], [place] [address] [place], [place] [address]   MRSA Next Gen by PCR, Nasal     Status: None   Collection Time: 04/17/21  4:21 PM   Specimen: Nasal Mucosa; Nasal Swab  Result Value Ref Range Status   MRSA by PCR Next Gen NOT DETECTED NOT DETECTED Final    Comment: (NOTE) The GeneXpert MRSA Assay (FDA approved for NASAL specimens only), is one component of a comprehensive MRSA colonization surveillance program. It is not intended to diagnose MRSA infection nor to guide or monitor treatment for MRSA infections. Test performance is not FDA approved in patients less than 32 years old. Performed at [facility], [place] [address], [place], [place] [address]       Imaging Studies   DG CHEST PORT 1 VIEW  Result Date: 04/26/2021 CLINICAL DATA:  Status post bronchoscopy and biopsy EXAM: PORTABLE CHEST 1 VIEW COMPARISON:  04/25/2021 FINDINGS: Left chest cardiac device with leads in the right atrium and ventricle. Unchanged cardiac and mediastinal contours. A surgical clip now projects over the left upper lobe, with any residual nodule possibly obscured by the cardiac device. No  focal pulmonary opacity. No pleural effusion or pneumothorax. No acute osseous abnormality. IMPRESSION: No pneumothorax seen, status post bronchoscopy and biopsy. Electronically Signed   By: Merilyn Baba M.D.   On: 04/26/2021 16:03   DG Chest Port 1 View  Result Date: 04/25/2021 CLINICAL DATA:  70 year old male with enlarging left upper lobe pulmonary nodule EXAM: PORTABLE CHEST 1 VIEW COMPARISON:  Prior chest x-ray 04/17/2021 FINDINGS: Left subclavian approach cardiac rhythm maintenance device. Leads project over the right atrium and right ventricle. Cardiac and mediastinal contours are within normal limits. Subtle nodular opacity projects over the left mid lung partially obscured by the generator of the cardiac rhythm  maintenance device. The nodule measures approximately 2.5 cm. No pneumothorax or pleural effusion. No acute osseous abnormality. IMPRESSION: 1. No active disease. 2. Known left upper lobe pulmonary nodule partially obscured by the overlying generator from the cardiac rhythm maintenance device. The nodule measures approximately 2.5 cm. Electronically Signed   By: Jacqulynn Cadet M.D.   On: 04/25/2021 08:07   DG C-ARM BRONCHOSCOPY  Result Date: 04/26/2021 C-ARM BRONCHOSCOPY: Fluoroscopy was utilized by the requesting physician.  No radiographic interpretation.     Medications   Scheduled Meds:  budesonide  2 mL Inhalation BID   chlorhexidine       Chlorhexidine Gluconate Cloth  6 each Topical Daily   [START ON 04/27/2021] clopidogrel  75 mg Oral Daily   docusate sodium  100 mg Oral BID   empagliflozin  25 mg Oral Daily   insulin aspart  0-5 Units Subcutaneous QHS   insulin aspart  0-9 Units Subcutaneous TID WC   insulin detemir  46 Units Subcutaneous Daily   metoprolol tartrate  50 mg Oral BID   mexiletine  300 mg Oral Q12H   montelukast  10 mg Oral Daily   polycarbophil  625 mg Oral Daily   rosuvastatin  40 mg Oral QHS   sacubitril-valsartan  1 tablet Oral BID    spironolactone  25 mg Oral Daily   Continuous Infusions:  sodium chloride     amiodarone 60 mg/hr (04/26/21 1343)       LOS: 9 days    Time spent: 25 minutes with > 50% spent at bedside and in coordination of care     Ezekiel Slocumb, DO [facility]  04/26/2021, 6:30 PM      If 7PM-7AM, please contact night-coverage. How to contact the [facility] Attending or Consulting provider [place] or covering provider during after hours [place], for this patient?    Check the care team in [facility] and look for a) attending/consulting [facility] provider listed and b) the [facility] team listed Log into [email or web address] and use [place]'s universal password to access. If you do not have the password, please contact the hospital operator. Locate the [facility] provider you are looking for under [facility] and page to a number that you can be directly reached. If you still have difficulty reaching the provider, please page the [facility] (Director on Call) for the Hospitalists listed on amion for assistance.

## 2021-04-26 NOTE — Interval H&P Note (Signed)
History and Physical Interval Note:  04/26/2021 1:34 PM  John [name]  has presented today for surgery, with the diagnosis of lung nodule.  The various methods of treatment have been discussed with the patient and family. After consideration of risks, benefits and other options for treatment, the patient has consented to  Procedure(s) with comments: [name] (Left) - ION w/ fiducial as a surgical intervention.  The patient's history has been reviewed, patient examined, no change in status, stable for surgery.  I have reviewed the patient's chart and labs.  Questions were answered to the patient's satisfaction.     [name]

## 2021-04-26 NOTE — Progress Notes (Signed)
Cardiology Office Note:    Date:  05/05/2021   ID:  John [name], DOB [birth year], MRN [id number]  PCP:  [name], MD  Cardiologist:  Shelva Majestic, MD  Electrophysiologist:  Constance Haw, MD   Referring MD: [name], MD   Chief Complaint: hospital follow-up of recurrent VT  History of Present Illness:    John [name] is a 70 y.o. male with a history of CAD s/p multiple PCIs most recently in 01/2021, ischemic cardiomyopathy/ chronic systolic CHF with EF of 40-98% on Echo in 03/2021, VT s/p ablation in 01/18/2021 with recurrence now on Amiodarone and [name], paroxysmal atrial flutter on Eliquis, hypertension, hyperlipidemia, diabetes mellitus, obstructive sleep apnea on CPAP, interstitial lung disease, pulmonary nodule s/p recent lung biopsy, and osteomyelitis s/p left 2nd toe amputation on 02/10/2021 who is followed by Dr. Claiborne Billings and Dr. Curt Bears and presents today for hospital follow-up of recurrent VT.  Patient has a very complex cardiac history as stated above. He has extensive multivessel CAD s/p multiple PCIs in the past mostly recently in 01/2021 in setting of ACS. Patient has had multiple admissions over the last year for variety of issues but several of these have been VT.  He has been on multiple antiarrhythmics in the past.  He was initially on Amiodarone but this was stopped in the setting of his interstitial lung disease.  He was then tried on Sotalol and [name] continued to have recurrent VT.  He ultimately underwent VT ablation on 01/18/2021.  Following ablation, he was later that month with osteomyelitis of his left foot and ultimately under went amputation of his left second toe on 02/10/2021.  After this discharge, he was quickly readmitted from 02/14/2021 to 02/18/2021 for a NSTEM.  High-sensitivity troponin peaked at 3,076.  Echo showed LVEF 40 to 45% with multiple wall motion abnormalities and grade 1 diastolic dysfunction.  Cardiac catheterization  showed 100% stenosis followed by 80% stenosis of RPAV.  Patient underwent very difficult but ultimately successful PCI/DES to this lesion.  Near the completion of the PCI, patient had a change in responsiveness as well as left-sided weakness and facial droop.  Code stroke was called and stat head CT showed no acute findings.  Head/neck CTA showed no acute findings but brain MRI did confirm acute infarct of the right thalamus with small punctate areas of acute infarct in the frontal lobes bilaterally. Neurology was consulted and agreed with triple therapy with Aspirin, Plavix, and Eliquis for 1 month at which time Aspirin can be discontinued.  During these multiple admissions, he was also found to have a lung nodule concerning for cancer. Pulmonology recommended biopsy.  After long discussion with interventional team again to pharmacy, plan was to proceed with biopsy after 1 month of triple therapy; however, plan was for him to be readmitted several days before biopsy for bridging with [id number] inhibitor while off Plavix and IV Heparin while of Eliquis.  Plan was for readmitted on 04/20/2021 for bridging with biopsy planned for 04/26/2021. However, before this could happen, he was admitted twice with recurrent VT.  During the first of these admission in 02/2021, no changes were made to his medical therapy and he was continued on Sotalol and [name] but device was to incorporate a VT zone down to under 20 bpm to allow for ATP for his slow VT.    He was then admitted from 04/17/2021 to 04/29/2021 after presenting with recurrent VT with multiple ICD shocks.  On arrival to  the ED, he was in sustained monomorphic VT below his detection zone and was cardioverted.  Echo showed a drop in his EF to 25-30% felt to be due to his recurrent VT. Spironolactone was added. His Sotalol and [name] were stopped and he was started on IV Amiodarone and IV Lidocaine.  Lidocaine was ultimately stopped and he was restarted on  [name].  He continued to have intermittent VT throughout admission that would typically respond with rebolus of Amiodarone. While admitted, decision was made to keep him until his lung biopsy for above bridging. The last few days of admission, patient started complaining of left arm pain. Venous ultrasound on 04/29/2021  showed acute DVT in the left axillary vein and acute superficial vein thrombosis involving the left basilic vein and left cephalic vein. Eliquis was increased to treatment dose for acute DVT. He was discharged on Amiodarone 400mg  twice daily, [name] 300mg  twice daily, Lopressor 50mg  twice daily, Spironolactone 12.5mg  daily, Crestor 40mg  daily, Plavix 75mg  daily, and Eliquis 10mg  twice daily.  Patient presents today for follow-up. Here alone. He has done well since discharge. No recurrent palpitations or VT. No ICD shocks. No chest pain. He states his breathing is intermittently "rough at times" but this is not new. No orthopnea or PND. No significant lower extremity edema. No syncope. He continues to have left upper extremity swelling from his DVT. He saw his PCP for this earlier today who is going to have him continue higher dose of Eliquis 10mg  twice daily for an additional week.  Past Surgical History:  Procedure Laterality Date   AMPUTATION TOE Left 02/10/2021   Procedure: AMPUTATION  LEFT SECOND TOE;  Surgeon: Edrick Kins, DPM;  Location: [place];  Service: Podiatry;  Laterality: Left;   [place]   BRONCHIAL BIOPSY  04/26/2021   Procedure: BRONCHIAL BIOPSIES;  Surgeon: Garner Nash, DO;  Location: [place] [facility];  Service: Pulmonary;;   BRONCHIAL BRUSHINGS  04/26/2021   Procedure: BRONCHIAL BRUSHINGS;  Surgeon: Garner Nash, DO;  Location: [facility];  Service: Pulmonary;;   BRONCHIAL NEEDLE ASPIRATION BIOPSY  04/26/2021   Procedure: BRONCHIAL NEEDLE ASPIRATION BIOPSIES;  Surgeon: Garner Nash, DO;  Location: [place];  Service: Pulmonary;;    CARDIAC CATHETERIZATION  2010   6 stents total   CARDIAC CATHETERIZATION  01/2000   percutaneous transluminal coronary balloon angioplasty of mid RCA stenotic lesion   CARDIAC CATHETERIZATION  06/2006   no stenting; ischemic cardiomyopathy, EF 40-45%   CARDIOVERSION N/A 07/28/2016   Procedure: CARDIOVERSION;  Surgeon: Troy Sine, MD;  Location: [place];  Service: Cardiovascular;  Laterality: N/A;   CORONARY ANGIOPLASTY  09/1998   mid-distal RCA balloon dilatation, 4.5 & 5.0 stents    CORONARY ANGIOPLASTY WITH STENT PLACEMENT  03/1994   angioplasty & stenting (non-DES) of circumflex/prox ramus intermedius   CORONARY ANGIOPLASTY WITH STENT PLACEMENT  10/1994   large iliac [id number] stent to RCA   [place]  12/2002   4.22mm stents x2 of RCA   CORONARY ANGIOPLASTY WITH STENT PLACEMENT  01/2005   cutting balloon arthrectomy of distal RCA & Cypher DES 3.5x13; cutting balloon arthrectomy of mid RCA with Cypher DES 3.5x18   CORONARY ANGIOPLASTY WITH STENT PLACEMENT  11/2008   stenting of mid RCA with 4.0x58mm driver, non-DES   CORONARY BALLOON ANGIOPLASTY N/A 02/15/2021   Procedure: CORONARY BALLOON ANGIOPLASTY;  Surgeon: Troy Sine, MD;  Location: [facility] [facility];  Service: Cardiovascular;  Laterality:  N/A;   FIDUCIAL MARKER PLACEMENT  04/26/2021   Procedure: FIDUCIAL MARKER PLACEMENT;  Surgeon: Garner Nash, DO;  Location: [facility];  Service: Pulmonary;;   ICD IMPLANT N/A 08/20/2020   Procedure: ICD IMPLANT;  Surgeon: Constance Haw, MD;  Location: [place] [facility];  Service: Cardiovascular;  Laterality: N/A;   INTRAVASCULAR PRESSURE WIRE/FFR STUDY N/A 03/02/2020   Procedure: INTRAVASCULAR PRESSURE WIRE/FFR STUDY;  Surgeon: Leonie Man, MD;  Location: [facility] [facility];  Service: Cardiovascular;  Laterality: N/A;   LEFT HEART CATH AND CORONARY ANGIOGRAPHY N/A 03/02/2020   Procedure: LEFT HEART CATH AND CORONARY ANGIOGRAPHY;  Surgeon:  Leonie Man, MD;  Location: [facility];  Service: Cardiovascular;  Laterality: N/A;   LEFT HEART CATH AND CORONARY ANGIOGRAPHY N/A 08/19/2020   Procedure: LEFT HEART CATH AND CORONARY ANGIOGRAPHY;  Surgeon: [place] Harp, MD;  Location: [facility] [facility];  Service: Cardiovascular;  Laterality: N/A;   LEFT HEART CATH AND CORONARY ANGIOGRAPHY N/A 02/15/2021   Procedure: LEFT HEART CATH AND CORONARY ANGIOGRAPHY;  Surgeon: Troy Sine, MD;  Location: [place] [facility];  Service: Cardiovascular;  Laterality: N/A;   LEFT HEART CATHETERIZATION WITH CORONARY ANGIOGRAM N/A 02/27/2012   Procedure: LEFT HEART CATHETERIZATION WITH CORONARY ANGIOGRAM;  Surgeon: [place] Harp, MD;  Location: [facility];  Service: Cardiovascular;  Laterality: N/A;   TRANSTHORACIC ECHOCARDIOGRAM  07/29/2010   EF 50=55%, mod inf wall hypokinesis & mild post wall hypokinesis; LA mild-mod dilated; mild mitral annular calcif & mild MR; mild TR & elevated RV systolic pressure; AV mildly sclerotic; mild aortic root dilatation    V TACH ABLATION N/A 01/20/2021   Procedure: V TACH ABLATION;  Surgeon: Vickie Epley, MD;  Location: [place] [facility];  Service: Cardiovascular;  Laterality: N/A;   VIDEO BRONCHOSCOPY WITH ENDOBRONCHIAL NAVIGATION Left 04/26/2021   Procedure: VIDEO BRONCHOSCOPY WITH ENDOBRONCHIAL NAVIGATION;  Surgeon: Garner Nash, DO;  Location: [place];  Service: Pulmonary;  Laterality: Left;  ION w/ fiducial   VIDEO BRONCHOSCOPY WITH RADIAL ENDOBRONCHIAL ULTRASOUND  04/26/2021   Procedure: RADIAL ENDOBRONCHIAL ULTRASOUND;  Surgeon: Garner Nash, DO;  Location: [place] [facility];  Service: Pulmonary;;    Current Medications: Current Meds  Medication Sig   albuterol (VENTOLIN HFA) 108 (90 Base) MCG/ACT inhaler Inhale 2 puffs into the lungs every 6 (six) hours as needed for wheezing or shortness of breath.   amiodarone (PACERONE) 400 MG tablet Take 1 tablet (400 mg total) by mouth 2 (two)  times daily.   apixaban (ELIQUIS) 5 MG TABS tablet Take 2 tablets (10 mg total) by mouth 2 (two) times daily for 7 days, THEN 1 tablet (5 mg total) 2 (two) times daily for 23 days.   cephALEXin (KEFLEX) 500 MG capsule Take 1 capsule (500 mg total) by mouth 3 (three) times daily for 6 days.   clopidogrel (PLAVIX) 75 MG tablet Take 1 tablet (75 mg total) by mouth daily with breakfast.   ENTRESTO 24-26 MG TAKE 1 TABLET BY MOUTH TWICE A DAY   Fluticasone Furoate (ARNUITY ELLIPTA) 200 MCG/ACT AEPB Inhale 1 puff into the lungs daily.   glucose blood (ONETOUCH ULTRA) test strip USE TO CHECK BLOOD SUGAR 3 TIMES A DAY (E11.9) (Patient taking differently: 1 each by Other route 3 (three) times daily. USE TO CHECK BLOOD SUGAR 3 TIMES A DAY (E11.9))   insulin aspart (NOVOLOG FLEXPEN) 100 UNIT/ML FlexPen INJECT 5-20 UNITS SUBCUTANEOUSLY WITH LUNCH AND DINNER (Patient taking  differently: Inject 6-8 Units into the skin daily as needed for high blood sugar.)   Insulin Pen Needle (NOVOFINE) 32G X 6 MM MISC 1 each by Other route daily.   JARDIANCE 25 MG TABS tablet TAKE 1 TABLET BY MOUTH EVERY DAY   LEVEMIR FLEXTOUCH 100 UNIT/ML FlexTouch Pen INJECT 46 UNITS INTO THE SKIN DAILY. DX:E11.9   metoprolol tartrate (LOPRESSOR) 50 MG tablet Take 1 tablet (50 mg total) by mouth 2 (two) times daily.   mexiletine (MEXITIL) 150 MG capsule Take 2 capsules (300 mg total) by mouth every 12 (twelve) hours.   montelukast (SINGULAIR) 10 MG tablet TAKE 1 TABLET BY MOUTH EVERY DAY (Patient taking differently: Take 10 mg by mouth daily.)   nitroGLYCERIN (NITROSTAT) 0.4 MG SL tablet PLACE 1 TABLET (0.4 MG TOTAL) UNDER THE TONGUE EVERY 5 (FIVE) MINUTES AS NEEDED FOR CHEST PAIN.   rosuvastatin (CRESTOR) 40 MG tablet Take 1 tablet (40 mg total) by mouth daily.   spironolactone (ALDACTONE) 25 MG tablet Take 0.5 tablets (12.5 mg total) by mouth daily.     Allergies:   Omega-3 fatty acids, Benazepril, and Fish allergy   Social History    Socioeconomic History   Marital status: Married    Spouse name: Not on file   Number of children: 3   Years of education: Not on file   Highest education level: Not on file  Occupational History   Occupation: Best boy: [employer]    Comment: [place], [place]. [employer]  Tobacco Use   Smoking status: Former    Packs/day: 1.00    Years: 50.00    Pack years: 50.00    Types: Cigarettes    Quit date: 07/20/2016    Years since quitting: 4.7   Smokeless tobacco: Never  Vaping Use   Vaping Use: Never used  Substance and Sexual Activity   Alcohol use: Not Currently    Alcohol/week: 0.0 standard drinks   Drug use: No   Sexual activity: Yes  Other Topics Concern   Not on file  Social History Narrative   Not on file   Social Determinants of Health   Financial Resource Strain: Not on file  Food Insecurity: Not on file  Transportation Needs: Not on file  Physical Activity: Not on file  Stress: Not on file  Social Connections: Not on file     Family History: The patient's family history includes Heart attack in his father.  ROS:   Please see the history of present illness.     EKGs/Labs/Other Studies Reviewed:    The following studies were reviewed today:  Left Cardiac Catheterization 02/15/2021:   1st Mrg lesion is 55% stenosed.   Prox RCA lesion is 50% stenosed.   Prox RCA to Dist RCA lesion is 35% stenosed.   RPAV-1 lesion is 100% stenosed.   RPAV-2 lesion is 80% stenosed.   A stent was successfully placed.   Post intervention, there is a 0% residual stenosis.   Post intervention, there is a 0% residual stenosis.   Acute coronary syndrome secondary to total occlusion of a large distal right coronary artery at a site of prior distal stenting.   The LAD has mild irregularity without significant disease.  The large ramus intermediate vessel has previously noted 55% very proximal stenosis not significantly changed.  The AV groove circumflex is a small vessel.    The RCA is a very large vessel with diffuse stents proximally to very distally.  There is  proximal 50% stenosis prior to the proximal to mid stent.  There is diffuse 35% narrowing within the long proximal stent.  The distal stent is totally occluded just after PDA vessel.   Difficult but successful PCI requiring PTCA, a 3.5 x 15 mm Scorflex multiple dilatations throughout the previously placed tandem 3.5 mm  distal stents with ultimate noncompliant balloon dilatation to approximately 3.6 mm and insertion of a 2.75 x 22 mm Onyx Frontier new stent distal to the distal stent placed in overlap 75 to 80% stenosis beyond the stented segment, postdilated with a 3 oh balloon with residual narrowing of 0%.   LVEDP 18 mmHg   Recommendation: Initial triple drug therapy with plan discontinuance of aspirin and continuation of Plavix/for minimum of 6 to 12 months.  Near the completion of the procedure, the patient appeared slightly less responsive.  Blood sugar was obtained which was 63.  He was given one half amp of D50.  After the procedure, a subtle left facial droop developed and there appeared to be  left arm weakness..  A code stroke was activated and the patient was seen immediately by neurology and sent for head CT for further evaluation.  Diagnostic Dominance: Right Intervention    Limited Echocardiogram 04/18/2021: Impressions:  1. Left ventricular ejection fraction, by estimation, is 25 to 30%. Left  ventricular ejection fraction by 3D volume is 27 %. The left ventricle has  severely decreased function. The left ventricle demonstrates regional wall  motion abnormalities (see  scoring diagram/findings for description). The left ventricular internal  cavity size was moderately dilated. Left ventricular diastolic parameters  are indeterminate. Elevated left ventricular end-diastolic pressure. There  is severe hypokinesis of the  left ventricular, basal-mid inferoseptal wall and lateral wall.  There is  akinesis of the left ventricular, basal-mid inferior wall and  inferolateral wall.   2. Right ventricular systolic function is normal. The right ventricular  size is mildly enlarged. There is normal pulmonary artery systolic  pressure. The estimated right ventricular systolic pressure is 16.1 mmHg.   3. The mitral valve is normal in structure. Mild mitral valve  regurgitation. No evidence of mitral stenosis.   4. The aortic valve is normal in structure. Aortic valve regurgitation is  not visualized. No aortic stenosis is present.   5. Aortic dilatation noted. There is mild dilatation of the aortic root,  measuring 38 mm. There is mild dilatation of the ascending aorta,  measuring 43 mm.   6. The inferior vena cava is dilated in size with <50% respiratory  variability, suggesting right atrial pressure of 15 mmHg.   7. Compared to study dated 02/15/2021, LVF has declined _______________  Upper Extremity Venous Doppler 04/29/2021: Summary:  - Right: No evidence of thrombosis in the subclavian.  - Left: Findings consistent with acute deep vein thrombosis involving the left axillary vein. Findings consistent with acute superficial vein thrombosis involving the left basilic vein and left cephalic vein.  EKG:  EKG ordered today. EKG personally reviewed and demonstrates atrial paced rhythm, rate 81 bpm, with 1st degree AV block (PR interval 298 ms), LVH, and non-specific ST/T changes. QTc 483 ms.   Recent Labs: 01/17/2021: B Natriuretic Peptide 169.7 04/17/2021: TSH 0.651 04/18/2021: ALT 13 04/29/2021: BUN 22; Creatinine, Ser 1.17; Hemoglobin 16.5; Magnesium 2.4; Platelets 214; Potassium 5.1; Sodium 138  Recent Lipid Panel    Component Value Date/Time   CHOL 120 04/18/2021 0133   CHOL 115 01/24/2019 0930   TRIG 80 04/18/2021 0133  HDL 30 (L) 04/18/2021 0133   HDL 33 (L) 01/24/2019 0930   CHOLHDL 4.0 04/18/2021 0133   VLDL 16 04/18/2021 0133   LDLCALC 74 04/18/2021 0133    LDLCALC 50 09/15/2019 1538    Physical Exam:    Vital Signs: BP 120/90 (BP Location: Right Arm, Patient Position: Sitting, Cuff Size: Normal)   Pulse 81   Resp 20   Ht 6' (1.829 m)   Wt 213 lb 8 oz (96.8 kg)   SpO2 98%   BMI 28.96 kg/m     Wt Readings from Last 3 Encounters:  05/05/21 213 lb 8 oz (96.8 kg)  05/05/21 211 lb 3.2 oz (95.8 kg)  04/28/21 207 lb 3.7 oz (94 kg)     General: 70 y.o. Caucasian male in no acute distress. HEENT: Normocephalic and atraumatic. Sclera clear. Eye patch over left eye. Neck: Supple. No carotid bruits. No JVD. Heart: RRR. Distinct S1 and S2. No murmurs, gallops, or rubs.  Lungs: No increased work of breathing. Clear to ausculation bilaterally. No wheezes, rhonchi, or rales.  Abdomen: Soft, non-distended, and non-tender to palpation.  Extremities: No lower extremity edema. Edema of left upper extremity from DVT. Skin: Warm and dry. Neuro: Alert and oriented x3. No focal deficits. Psych: Normal affect. Responds appropriately.  Assessment:    1. Coronary artery disease involving native coronary artery of native heart without angina pectoris   2. Ischemic cardiomyopathy   3. Chronic combined systolic and diastolic CHF (congestive heart failure) ([name])   4. VT (ventricular tachycardia)   5. S/P ICD (internal cardiac defibrillator) procedure   6. Paroxysmal atrial flutter (HCC)   7. Acute deep vein thrombosis (DVT) of left upper extremity, unspecified vein (HCC)   8. Primary hypertension   9. Hyperlipidemia, unspecified hyperlipidemia type   10. Type 2 diabetes mellitus with complication, with long-term current use of insulin ([place])   11. Obstructive sleep apnea   12. Interstitial lung disease ([place])   13. Lung nodule   14. Medication management     Plan:    CAD - S/p multiple PCIs most recently in 01/2021. - No angina.  - Continue Plavix 75mg  daily. No Aspirin due to need for DOAC. - Continue beta-blocker and high-intensity  statin.  Ischemic Cardiomyopathy Chronic Combined CHF - Most recent Echo on 04/18/2021 showed LVEF of 25-30% with severe hypokinesis of the basal-mid inferoseptal wall and lateral wall as well as akinesis of the basal-mid inferior wall and inferolateral wall. RV mildly enlarged with normal systolic function. Drop in EF felt to be secondary to recurrent VT lately.  - Euvolemic on exam.  - Continue Entresto 24-26mg  twice daily. - Continue Lopressor 50mg  twice daily. Would ideally be on Toprol-XL given reduced EF but given recurrent VT recently will not make any adjustments to this at this time.  - Continue Spironolactone 12.5mg  daily. - Continue Jardiance 25mg  daily.  - Discussed importance of daily weights and sodium/fluid restrictions. - Will repeat BMET today. - Consider repeat Echo in 3 months to reassess EF.  Recurrent VT s/p ICD - S/p ablation in 01/2021. He has had several admissions recently for recurrent VT - most recently earlier this month. Antiarrhythmics were adjusted.  - Stable. No palpitations, fluttering, or ICD shocks since discharge. - Continue Lopressor 50mg  twice daily.  - Continue Amiodarone 400mg  twice daily. Will defer tapering of this to EP. - Continue [name] 300mg  twice daily.  - Follow-up with EP as directed. - Will check BMET and Magnesium today.  Paroxysmal Atrial Flutter - Maintaining sinus rhythm. - Continue beta-blocker and antiarrhythmics as above. - Continue chronic anticoagulation with Eliquis. Currently on increased dose due to acute DVT. - Will recheck CBC today.  Acute DVT - Diagnosed with acute DVT of left upper extremity during recent admission in setting of holding anticoagulation for lung biopsy.  - On Eliquis. Saw PCP today who extended duration of increased dose (10mg  twice daily) for an additional week and will then resume 5mg  twice daily dosing (of note, patient stated PCP extended increased dose for an additional week but PCP's note from  today states patient had not been taking increased dose).   Hypertension - BP reasonably well controlled. - Continue medications for CHF as above.  Hyperlipidemia - Lipid panel on 04/18/2021: Total Cholesterol 120mg  daily, Triglycerides 80mg  daily, HDL 30, LDL 74.  - LDL goal at least <70 (better yet <55) given CAD. - Crestor was increased to 40mg  daily during recent admisison. - Will repeat lipid panel and LFTs in 6-8 weeks.   Type 2 Diabetes Mellitus - Hemoglobin A1c 6.9 during recent admisison.  - On Jardiance and Insulin at home.  - Management per PCP.  Obstructive Sleep Apnea - Continue CPAP.   Lung Nodule - S/p lung biopsy on 04/26/2021 and is confirmed malignancy. He is scheduled to see Oncology tomorrow and plan is likely for radiation.  Interstitial Lung Disease - Followed by Pulmonology .   Disposition: Patient already has visit with Dr. Claiborne Billings scheduled for January 2023.   Medication Adjustments/Labs and Tests Ordered: Current medicines are reviewed at length with the patient today.  Concerns regarding medicines are outlined above.  Orders Placed This Encounter  Procedures   Magnesium   CBC   Basic metabolic panel   EKG 82-UMPN   No orders of the defined types were placed in this encounter.   Patient Instructions  Medication Instructions:  Your physician recommends that you continue on your current medications as directed. Please refer to the Current Medication list given to you today.  *If you need a refill on your cardiac medications before your next appointment, please call your pharmacy*  Lab Work: Your physician recommends that you return for lab work TODAY:  BMET CBC Magnesium   If you have labs (blood work) drawn today and your tests are completely normal, you will receive your results only by: [place] (if you have MyChart) OR A paper copy in the mail If you have any lab test that is abnormal or we need to change your treatment, we will  call you to review the results.  Testing/Procedures: NONE ordered at this time of appointment   Follow-Up: At [facility], you and your health needs are our priority.  As part of our continuing mission to provide you with exceptional heart care, we have created designated Provider Care Teams.  These Care Teams include your primary Cardiologist (physician) and Advanced Practice Providers (APPs -  Physician Assistants and Nurse Practitioners) who all work together to provide you with the care you need, when you need it.  Your next appointment:   As scheduled   The format for your next appointment:   In Person  Provider:   Shelva Majestic, MD    Other Instructions    Signed, Darreld Mclean, PA-C  05/05/2021 6:13 PM    [facility]

## 2021-04-26 NOTE — Consult Note (Signed)
NAME:  John [name], MRN:  [id number], DOB:  [birth year], LOS: 9 ADMISSION DATE:  04/17/2021, CONSULTATION DATE:  04/20/21 REFERRING MD:  Lovena Le, CHIEF COMPLAINT:  palpitations    History of Present Illness:  70 year old man with complex ischemic heart history and recurrent Vtach who presented for sustained VT requiring cardioversion in ER.  He has been on EP service for management of this but now is improved.  His current complaints include hemorrhoids and constipation.  He has a rapidly enlarging RUL nodule that there was plan to biopsy on Tuesday with Dr. Valeta Harms.  We are going to have to bridge him with IV antiplatelet therapy due to recent DES.    Pertinent  Medical History   Past Medical History:  Diagnosis Date   Atrial flutter ([place])    s/p cardioversion   Coronary artery disease    Diabetes mellitus    GERD (gastroesophageal reflux disease)    History of nuclear stress test 04/04/2011   lexiscan; mod-large in size fixed inferolateral defect (scar); non-diagnostic for ischemia; low risk scan    Hyperlipidemia    Hypertension    Left foot drop    r/t past disk srugery - uses Kevlar brace   Myocardial infarction (H[facility])    posterior MI   Shortness of breath    Sleep apnea    on CPAP; 04/28/2007 split-night - AHI during total sleep 44.43/hr and REM 72.56/hr   Significant Hospital Events: Including procedures, antibiotic start and stop dates in addition to other pertinent events   10/30 admitted cardioverted  Interim History / Subjective:  Agreeable to bronchoscopy today He does feel the weight of his chronic medical conditions have affected him but he wishes to pursue biopsy to consider his options.  Objective   Blood pressure (!) 148/93, pulse 74, temperature 98.6 F (37 C), temperature source Oral, resp. rate 18, height 6' (1.829 m), weight 93.8 kg, SpO2 97 %.       No intake or output data in the 24 hours ending 04/26/21 1504  Filed Weights   04/24/21 0628 04/25/21  0511 04/26/21 0424  Weight: 93.4 kg 93.5 kg 93.8 kg   Physical Exam: General: Chronically ill-appearing, no acute distress HENT: [place], AT Eyes: EOMI, no scleral icterus Respiratory: Clear to auscultation bilaterally.  No crackles, wheezing or rales Cardiovascular: RRR, -M/R/G, no JVD Extremities:-Edema,-tenderness Neuro: AAO x4, CNII-XII grossly intact Psych: Normal mood, normal affect   Resolved Hospital Problem list   N/A  Assessment & Plan:  Recurrent monomorphic VT s/p ICD 08/2020, ablation 01/2021 NICM/Chronic systolic heart failure CAD s/p DES --Telemetry --per EP: On IV amiodarone  Enlarging RUL nodule, 6x4 mm to 2 x 19 mm Concerning for aggressive lung malignancy (see Dr. Juline Patch notes) --Aggrastat until 11/8 2AM --NPO for ENB today  Best Practice (right click and "Reselect all SmartList Selections" daily)   Diet/type: Regular consistency (see orders) DVT prophylaxis: SCD GI prophylaxis: PPI Lines: N/A Foley:  N/A Code Status:  full code Last date of multidisciplinary goals of care discussion [n/a]  Labs   CBC: Recent Labs  Lab 04/20/21 1853 04/22/21 0134 04/25/21 0436  WBC 8.3 8.0 6.7  HGB 17.8* 17.8* 16.7  HCT 55.0* 56.0* 49.9  MCV 91.8 93.0 93.1  PLT 174 168 488    Basic Metabolic Panel: Recent Labs  Lab 04/22/21 0134 04/23/21 0206 04/24/21 0207 04/25/21 0436 04/26/21 0218  NA 140 138 139 140 140  K 4.3 3.7 3.8 4.0 4.0  CL 104  106 110 110 108  CO2 31 26 24 24 26   GLUCOSE 85 107* 135* 106* 116*  BUN 21 23 21 17 18   CREATININE 1.38* 1.31* 1.13 1.07 1.39*  CALCIUM 9.4 8.8* 8.5* 8.5* 8.5*  MG 2.3 2.2 2.1 2.2 2.1   Care Time: 25 min  Rodman Pickle, M.D. [facility] Pulmonary/Critical Care Medicine 04/26/2021 3:05 PM   Please see Amion for pager number to reach on-call Pulmonary and Critical Care Team.

## 2021-04-26 NOTE — Discharge Instructions (Addendum)
Flexible Bronchoscopy, Care After This sheet gives you information about how to care for yourself after your test. Your doctor may also give you more specific instructions. If you have problems or questions, contact your doctor. Follow these instructions at home: Eating and drinking Do not eat or drink anything (not even water) for 2 hours after your test, or until your numbing medicine (local anesthetic) wears off. When your numbness is gone and your cough and gag reflexes have come back, you may: Eat only soft foods. Slowly drink liquids. The day after the test, go back to your normal diet. Driving Do not drive for 24 hours if you were given a medicine to help you relax (sedative). Do not drive or use heavy machinery while taking prescription pain medicine. General instructions  Take over-the-counter and prescription medicines only as told by your doctor. Return to your normal activities as told. Ask what activities are safe for you. Do not use any products that have nicotine or tobacco in them. This includes cigarettes and e-cigarettes. If you need help quitting, ask your doctor. Keep all follow-up visits as told by your doctor. This is important. It is very important if you had a tissue sample (biopsy) taken. Get help right away if: You have shortness of breath that gets worse. You get light-headed. You feel like you are going to pass out (faint). You have chest pain. You cough up: More than a little blood. More blood than before. Summary Do not eat or drink anything (not even water) for 2 hours after your test, or until your numbing medicine wears off. Do not use cigarettes. Do not use e-cigarettes. Get help right away if you have chest pain.  This information is not intended to replace advice given to you by your health care provider. Make sure you discuss any questions you have with your health care provider. Document Released: 04/02/2009 Document Revised: 05/18/2017 Document  Reviewed: 06/23/2016 Elsevier Patient Education  2020 [place] on my medicine - ELIQUIS (apixaban)  Why was Eliquis prescribed for you? Eliquis was prescribed to treat blood clots that may have been found in the veins of your legs (deep vein thrombosis) or in your lungs (pulmonary embolism) and to reduce the risk of them occurring again.  What do You need to know about Eliquis ? The starting dose is 10 mg (two 5 mg tablets) taken TWICE daily for the FIRST SEVEN (7) DAYS, then on 05/06/2021 the dose is reduced to ONE 5 mg tablet taken TWICE daily.  Eliquis may be taken with or without food.   Try to take the dose about the same time in the morning and in the evening. If you have difficulty swallowing the tablet whole please discuss with your pharmacist how to take the medication safely.  Take Eliquis exactly as prescribed and DO NOT stop taking Eliquis without talking to the doctor who prescribed the medication.  Stopping may increase your risk of developing a new blood clot.  Refill your prescription before you run out.  After discharge, you should have regular check-up appointments with your healthcare provider that is prescribing your Eliquis.    What do you do if you miss a dose? If a dose of ELIQUIS is not taken at the scheduled time, take it as soon as possible on the same day and twice-daily administration should be resumed. The dose should not be doubled to make up for a missed dose.  Important Safety Information A possible side  effect of Eliquis is bleeding. You should call your healthcare provider right away if you experience any of the following: Bleeding from an injury or your nose that does not stop. Unusual colored urine (red or dark brown) or unusual colored stools (red or black). Unusual bruising for unknown reasons. A serious fall or if you hit your head (even if there is no bleeding).  Some medicines may interact with Eliquis and might  increase your risk of bleeding or clotting while on Eliquis. To help avoid this, consult your healthcare provider or pharmacist prior to using any new prescription or non-prescription medications, including herbals, vitamins, non-steroidal anti-inflammatory drugs (NSAIDs) and supplements.  This website has more information on Eliquis (apixaban): http://www.eliquis.com/eliquis/home

## 2021-04-27 ENCOUNTER — Encounter (HOSPITAL_COMMUNITY): Payer: Self-pay | Admitting: Pulmonary Disease

## 2021-04-27 DIAGNOSIS — I472 Ventricular tachycardia, unspecified: Secondary | ICD-10-CM | POA: Diagnosis not present

## 2021-04-27 DIAGNOSIS — R911 Solitary pulmonary nodule: Secondary | ICD-10-CM | POA: Diagnosis not present

## 2021-04-27 LAB — BASIC METABOLIC PANEL
Anion gap: 9 (ref 5–15)
BUN: 18 mg/dL (ref 8–23)
CO2: 23 mmol/L (ref 22–32)
Calcium: 9.2 mg/dL (ref 8.9–10.3)
Chloride: 105 mmol/L (ref 98–111)
Creatinine, Ser: 1.31 mg/dL — ABNORMAL HIGH (ref 0.61–1.24)
GFR, Estimated: 58 mL/min — ABNORMAL LOW (ref >60)
Glucose, Bld: 175 mg/dL — ABNORMAL HIGH (ref 70–99)
Potassium: 4.4 mmol/L (ref 3.5–5.1)
Sodium: 137 mmol/L (ref 135–145)

## 2021-04-27 LAB — CBC
HCT: 55.9 % — ABNORMAL HIGH (ref 39.0–52.0)
Hemoglobin: 18 g/dL — ABNORMAL HIGH (ref 13.0–17.0)
MCH: 30.2 pg (ref 26.0–34.0)
MCHC: 32.2 g/dL (ref 30.0–36.0)
MCV: 93.6 fL (ref 80.0–100.0)
Platelets: 215 10*3/uL (ref 150–400)
RBC: 5.97 MIL/uL — ABNORMAL HIGH (ref 4.22–5.81)
RDW: 13.1 % (ref 11.5–15.5)
WBC: 13 10*3/uL — ABNORMAL HIGH (ref 4.0–10.5)
nRBC: 0 % (ref 0.0–0.2)

## 2021-04-27 LAB — GLUCOSE, CAPILLARY
Glucose-Capillary: 205 mg/dL — ABNORMAL HIGH (ref 70–99)
Glucose-Capillary: 215 mg/dL — ABNORMAL HIGH (ref 70–99)
Glucose-Capillary: 173 mg/dL — ABNORMAL HIGH (ref 70–99)
Glucose-Capillary: 193 mg/dL — ABNORMAL HIGH (ref 70–99)

## 2021-04-27 LAB — MAGNESIUM: Magnesium: 2.2 mg/dL (ref 1.7–2.4)

## 2021-04-27 MED ORDER — AMIODARONE HCL 200 MG PO TABS
400.0000 mg | ORAL_TABLET | Freq: Two times a day (BID) | ORAL | Status: DC
  Administered 2021-04-27 – 2021-04-29 (×5): 400 mg via ORAL
  Filled 2021-04-27 (×5): qty 2

## 2021-04-27 NOTE — Consult Note (Signed)
NAME:  John [name], MRN:  [id number], DOB:  July 31, 1950, LOS: 60 ADMISSION DATE:  04/17/2021, CONSULTATION DATE:  04/20/21 REFERRING MD:  Lovena Le, CHIEF COMPLAINT:  palpitations    History of Present Illness:  70 year old man with complex ischemic heart history and recurrent Vtach who presented for sustained VT requiring cardioversion in ER.  He has been on EP service for management of this but now is improved.  His current complaints include hemorrhoids and constipation.  He has a rapidly enlarging RUL nodule that there was plan to biopsy on Tuesday with Dr. Valeta Harms.  We are going to have to bridge him with IV antiplatelet therapy due to recent DES.    Pertinent  Medical History   Past Medical History:  Diagnosis Date   Atrial flutter ([place])    s/p cardioversion   Coronary artery disease    Diabetes mellitus    GERD (gastroesophageal reflux disease)    History of nuclear stress test 04/04/2011   lexiscan; mod-large in size fixed inferolateral defect (scar); non-diagnostic for ischemia; low risk scan    Hyperlipidemia    Hypertension    Left foot drop    r/t past disk srugery - uses Kevlar brace   Myocardial infarction ([place])    posterior MI   Shortness of breath    Sleep apnea    on CPAP; 04/28/2007 split-night - AHI during total sleep 44.43/hr and REM 72.56/hr   Significant Hospital Events: Including procedures, antibiotic start and stop dates in addition to other pertinent events   10/30 admitted cardioverted  Interim History / Subjective:  S/p bronch. No hemoptysis, cough. Voice still hoarse however this was prior to procedure  Objective   Blood pressure 127/88, pulse 69, temperature 97.7 F (36.5 C), temperature source Oral, resp. rate 19, height 6' (1.829 m), weight 93.4 kg, SpO2 95 %.        Intake/Output Summary (Last 24 hours) at 04/27/2021 1207 Last data filed at 04/27/2021 0004 Gross per 24 hour  Intake 700 ml  Output 375 ml  Net 325 ml    Filed Weights    04/25/21 0511 04/26/21 0424 04/27/21 0415  Weight: 93.5 kg 93.8 kg 93.4 kg   Physical Exam: General: Chronically ill-appearing, no acute distress HENT: [place], AT, OP clear, MMM Eyes: EOMI, no scleral icterus Respiratory: Clear to auscultation bilaterally.  No crackles, wheezing or rales Cardiovascular: RRR, -M/R/G, no JVD Extremities:-Edema,-tenderness Neuro: AAO x4, CNII-XII grossly intact   Resolved Hospital Problem list   N/A  Assessment & Plan:  Recurrent monomorphic VT s/p ICD 08/2020, ablation 01/2021 NICM/Chronic systolic heart failure CAD s/p DES --Telemetry --Anti-arrhythmics and anticoagulation per EP team  Enlarging RUL nodule, 6x4 mm to 2 x 19 mm Concerning for aggressive lung malignancy (see Dr. Juline Patch notes) --S/p navigation bronchoscopy on 11/9 --Await pathology. Team will contact patient when results return and coordinate care with RadOnc once tissue is confirmed --Ok to discharge from pulmonary standpoint. Defer to primary team and cardiology when patient stable for release  Pulmonary will be available as needed.  Best Practice (right click and "Reselect all SmartList Selections" daily)   Diet/type: Regular consistency (see orders) DVT prophylaxis: SCD GI prophylaxis: PPI Lines: N/A Foley:  N/A Code Status:  full code Last date of multidisciplinary goals of care discussion [n/a]  Labs   CBC: Recent Labs  Lab 04/20/21 1853 04/22/21 0134 04/25/21 0436 04/27/21 0430  WBC 8.3 8.0 6.7 13.0*  HGB 17.8* 17.8* 16.7 18.0*  HCT 55.0*  56.0* 49.9 55.9*  MCV 91.8 93.0 93.1 93.6  PLT 174 168 182 161    Basic Metabolic Panel: Recent Labs  Lab 04/23/21 0206 04/24/21 0207 04/25/21 0436 04/26/21 0218 04/27/21 0430  NA 138 139 140 140 137  K 3.7 3.8 4.0 4.0 4.4  CL 106 110 110 108 105  CO2 26 24 24 26 23   GLUCOSE 107* 135* 106* 116* 175*  BUN 23 21 17 18 18   CREATININE 1.31* 1.13 1.07 1.39* 1.31*  CALCIUM 8.8* 8.5* 8.5* 8.5* 9.2  MG 2.2 2.1 2.2 2.1  2.2   Care Time: 15 min  Rodman Pickle, M.D. [facility] Pulmonary/Critical Care Medicine 04/27/2021 12:07 PM   See Amion for personal pager For hours between 7 PM to 7 AM, please call [place] for urgent questions

## 2021-04-27 NOTE — Progress Notes (Addendum)
Progress Note   Subjective   Feeling much better today, ambulating in the room, felt like pulmonary MD by their conversation that was confident there would be good treatment options  Inpatient Medications    Scheduled Meds:  amiodarone  400 mg Oral BID   budesonide  2 mL Inhalation BID   Chlorhexidine Gluconate Cloth  6 each Topical Daily   clopidogrel  75 mg Oral Daily   docusate sodium  100 mg Oral BID   empagliflozin  25 mg Oral Daily   insulin aspart  0-5 Units Subcutaneous QHS   insulin aspart  0-9 Units Subcutaneous TID WC   insulin detemir  46 Units Subcutaneous Daily   metoprolol tartrate  50 mg Oral BID   mexiletine  300 mg Oral Q12H   montelukast  10 mg Oral Daily   polycarbophil  625 mg Oral Daily   rosuvastatin  40 mg Oral QHS   sacubitril-valsartan  1 tablet Oral BID   spironolactone  25 mg Oral Daily   Continuous Infusions:  sodium chloride     PRN Meds: acetaminophen, albuterol, hydrALAZINE, hydrocortisone cream, nitroGLYCERIN, ondansetron (ZOFRAN) IV, zolpidem   Vital Signs    Vitals:   04/26/21 2115 04/27/21 0004 04/27/21 0415 04/27/21 0737  BP: 113/81 139/82 125/86 132/85  Pulse: 69 72 70 74  Resp: 18 18 18 19   Temp:  97.7 F (36.5 C) 99.2 F (37.3 C) 97.7 F (36.5 C)  TempSrc:  Oral Oral Oral  SpO2:  96% 97% 97%  Weight:   93.4 kg   Height:        Intake/Output Summary (Last 24 hours) at 04/27/2021 0745 Last data filed at 04/27/2021 0004 Gross per 24 hour  Intake 700 ml  Output 375 ml  Net 325 ml    Filed Weights   04/25/21 0511 04/26/21 0424 04/27/21 0415  Weight: 93.5 kg 93.8 kg 93.4 kg    Telemetry    A pacing, no VT - Personally Reviewed  Physical Exam    GEN- The patient is in no distress, alert and oriented x 3 today.   Head- normocephalic, atraumatic Eyes-  Sclera clear, conjunctiva pink Ears- hearing intact Oropharynx- clear Neck- supple, Lungs-  normal work of breathing Heart- RRR  GI- soft  Extremities- no  clubbing, cyanosis, or edema  MS- no significant deformity or atrophy Skin- no rash or lesion Psych- euthymic mood, full affect Neuro- strength and sensation are intact   Labs    Chemistry Recent Labs  Lab 04/25/21 0436 04/26/21 0218 04/27/21 0430  NA 140 140 137  K 4.0 4.0 4.4  CL 110 108 105  CO2 24 26 23   GLUCOSE 106* 116* 175*  BUN 17 18 18   CREATININE 1.07 1.39* 1.31*  CALCIUM 8.5* 8.5* 9.2  GFRNONAA >60 54* 58*  ANIONGAP 6 6 9      Hematology Recent Labs  Lab 04/22/21 0134 04/25/21 0436 04/27/21 0430  WBC 8.0 6.7 13.0*  RBC 6.02* 5.36 5.97*  HGB 17.8* 16.7 18.0*  HCT 56.0* 49.9 55.9*  MCV 93.0 93.1 93.6  MCH 29.6 31.2 30.2  MCHC 31.8 33.5 32.2  RDW 13.9 13.4 13.1  PLT 168 182 215     Patient ID  John [name] is a 70 y.o. male with a past medical history significant for CHF, CAD (02/14/21 with CP, NSTEMI, underwent cath and PCI  (Difficult for successful PTCA/PCI using 3.5 mm ScoreFlex balloon multiple inflations with post dilation of the previously stented) overlapping  Onyx Frontier DES 2.75 mm x 22 mm placed overlapping distally); prox RCA ~50%, prox-distal RCA 35%.  Unfortunately suffered a stroke), AFlutter, DM, ILD, and VT s/p ablation. Lung mass  he was admitted again this admission for VT storm.    VT ablation 01/2021 At that time to distinct ventricular tachycardias were noted.  One was a posterolateral VT that was successfully ablated near the papillary muscle.  A second VT was not targeted for ablation but was described as a left superior axis VT   Device information MDT dual chamber ICD implanted 08/20/20 Secondary prevention w/hx of hemodynamically unstable MMVT   AAD hx 2018 Pulmonary function studies have shown restriction with low DLCO.  He felt possibly also to have asthma based on nitric oxide testing.  With his lung disease it has been recommended that amiodarone be considered for discontinuance Looks like amio was stopped in  2019 Follows with pulmonology March 2022 mexilletine > recurrent VT higher doses were too expensive June 2022 sotalol started Aug 2022 more VT and mexiletine resumed 01/18/21, VT ablation 02/09/21, device interrogation done for MRI noted VT treated with ATP and mexiletine ordered for increase to 300mg  BID >>>> THIS DID NOT GET TRANSLATED ON DISCHARGE AND remained at 140ms 02/25/21 VT storm,  mexiletine increased 04/17/21: VT, symptomatic/intolerable, amiodarone added, sotalol stopped  Assessment & Plan    1.  VT Stopped home sotalol > amiodarone this admission Rhythm much better yesterday Annalisa Colonna plan to transition to PO amio today,pt reluctant but understand we need to make that transition Continue mexiletine, lopressor   No driving x 6 months   2. Ischemic CM/ CAD No ischemic symptoms Euvolemic Plavix loading dose last evening     3. Lung mass S/p Flexible video fiberoptic bronchoscopy with robotic assistance and biopsies yesterday    4. AFlutter CHA2DS2Vasc is 5 Maintaining SR, I think we hold off a couple days prior to resuming his Eliquis given loading dose of plavix last night     Tommye Standard, PA-C 04/27/2021 7:45 AM  I have seen and examined this patient with Tommye Standard.  Agree with above, note added to reflect my findings.  On exam, RRR, no murmurs. Post biopsy yesterday. Back on plavix daily. No further VT on IV amiodarone. Larua Collier switch to 400 mg PO amiodarone today.    Teresita Fanton M. Alvy Alsop MD 04/27/2021 8:25 AM

## 2021-04-27 NOTE — Consult Note (Signed)
   [facility] Inpatient Consult   04/27/2021  DINK CREPS [birth year] [id number]  [facility] [ACO] Patient: Medicare CMS DCE  Primary Care provider:  [name], MD, [place], [facility]  *Patient had been referred to [facility] Chronic Care Management team however via encounters the provider had difficulty maintaining contact to provide chronic care services.   Patient screened for length of stay with 7 admissions hospitalization within 6 months with noted extreme high risk score for unplanned readmission risk and  to assess for potential Embedded for post hospital transition.  Review of patient's medical record reveals patient is for post hospital follow up needs.  Met with patient at the bedside for ongoing follow up.  Met with patient at the bedside. Patient agrees to follow up for Chronic Care Management.  He states he would agree to his wife be contacted if he is not answering his calls. States he would like to do OP rehab at [facility] will message inpatient [facility] RN as well.     Plan:  Continue to follow progress and disposition to assess for post hospital care management needs.  Spoke with inpatient [facility] RNCM regarding [facility] is his OP rehab choice if this is part of his TOC post hospital plan.  For questions contact:   Natividad Brood, RN BSN [facility] Liaison  [phone] business mobile phone Toll free office [phone]  Fax number: [phone] [email or web address]

## 2021-04-27 NOTE — Progress Notes (Signed)
PROGRESS NOTE  John [name]  TIR:[id number] DOB: December 03, 1950 DOA: 04/17/2021 PCP: [name], MD   Brief Narrative: 70 year old male with past medical history significant for CHF, CAD, ILD, and VT s/p ablation and recurrent VT who presented on 04/17/2021 for sustained VT requiring cardioversion in the ER.  He was admitted on EP's service for management of his arrhythmia.    Cardiac rhythm has improved and patient being loaded with oral amiodarone.     Patient has a rapidly enlarging right upper lobe lung nodule that there was planned biopsy on Tuesday with Dr. Valeta Harms.  Due to a recent drug-eluting stent, patient requires DAPT.  He is being bridged with IV antiplatelet therapy and awaiting Plavix washout prior to bronchoscopy.  Assessment & Plan: Principal Problem:   Lung nodule Active Problems:   VT (ventricular tachycardia)  Recurrent VT, : s/p ablation Aug 2022, dual chamber ICD implanted 08/20/2020. - EP following, transition to oral amiodarone 11/9, request observation with this - Continue mexiletine, metoprolol - Maintain K, Mg levels - 6 months driving restrictions have been shared with patient.   CAD s/p stents, ICM: No anginal complaints currently. s/p antiplatelet bridging pre-bronch/biopsy, now restarted plavix with load 11/8. - Continue plavix, statin, BB  Chronic HFrEF:  - Continue metoprolol, entresto, spironolactone, empagliflozin.  - Monitor I/O, weights.   RUL lung mass:  - Follow up pathology from nav bronch 11/8 by Dr. Valeta Harms.   T2DM:  - Continue levemir, SSI - Continue empagliflozin  Asthma:  - Continue singulair, budesonide  Post LHC CVA:  - Plavix, statin as above. Anticoagulation reinitiation timing is per EP.  DVT prophylaxis: SCDs Code Status: Full Family Communication: None at bedside Disposition Plan:  Status is: Inpatient  Remains inpatient appropriate because: Close postprocedural observation required   Consultants:   EP Pulmonary  Procedures:  Navigational bronchoscopy with biopsy 04/26/2021 Dr. Valeta Harms  Antimicrobials: None   Subjective: No bleeding, no chest pain or dyspnea. No palpitations.  Objective: Vitals:   04/27/21 0737 04/27/21 1042 04/27/21 1128 04/27/21 1627  BP: 132/85  127/88 116/75  Pulse: 74 70 69 70  Resp: 19 18 19 18   Temp: 97.7 F (36.5 C)  97.7 F (36.5 C) (!) 97.4 F (36.3 C)  TempSrc: Oral  Oral Oral  SpO2: 97% 93% 95% 97%  Weight:      Height:        Intake/Output Summary (Last 24 hours) at 04/27/2021 1627 Last data filed at 04/27/2021 0004 Gross per 24 hour  Intake --  Output 375 ml  Net -375 ml   Filed Weights   04/25/21 0511 04/26/21 0424 04/27/21 0415  Weight: 93.5 kg 93.8 kg 93.4 kg    Gen: 70 y.o. male in no distress Pulm: Non-labored breathing room air. Clear to auscultation bilaterally.  CV: Regular rate and rhythm. No murmur, rub, or gallop. No JVD, no pitting pedal edema. GI: Abdomen soft, non-tender, non-distended, with normoactive bowel sounds. No organomegaly or masses felt. Ext: Warm, no deformities Skin: No rashes, lesions or ulcers on visualized skin. Neuro: Alert and oriented. No focal neurological deficits. Psych: Judgement and insight appear normal. Mood & affect appropriate.   Data Reviewed: I have personally reviewed following labs and imaging studies  CBC: Recent Labs  Lab 04/20/21 1853 04/22/21 0134 04/25/21 0436 04/27/21 0430  WBC 8.3 8.0 6.7 13.0*  HGB 17.8* 17.8* 16.7 18.0*  HCT 55.0* 56.0* 49.9 55.9*  MCV 91.8 93.0 93.1 93.6  PLT 174 168 182  993   Basic Metabolic Panel: Recent Labs  Lab 04/23/21 0206 04/24/21 0207 04/25/21 0436 04/26/21 0218 04/27/21 0430  NA 138 139 140 140 137  K 3.7 3.8 4.0 4.0 4.4  CL 106 110 110 108 105  CO2 26 24 24 26 23   GLUCOSE 107* 135* 106* 116* 175*  BUN 23 21 17 18 18   CREATININE 1.31* 1.13 1.07 1.39* 1.31*  CALCIUM 8.8* 8.5* 8.5* 8.5* 9.2  MG 2.2 2.1 2.2 2.1 2.2    GFR: Estimated Creatinine Clearance: 60.5 mL/min (A) (by C-G formula based on SCr of 1.31 mg/dL (H)). Liver Function Tests: No results for input(s): AST, ALT, ALKPHOS, BILITOT, PROT, ALBUMIN in the last 168 hours. No results for input(s): LIPASE, AMYLASE in the last 168 hours. No results for input(s): AMMONIA in the last 168 hours. Coagulation Profile: No results for input(s): INR, PROTIME in the last 168 hours. Cardiac Enzymes: No results for input(s): CKTOTAL, CKMB, CKMBINDEX, TROPONINI in the last 168 hours. BNP (last 3 results) No results for input(s): PROBNP in the last 8760 hours. HbA1C: No results for input(s): HGBA1C in the last 72 hours. CBG: Recent Labs  Lab 04/26/21 1617 04/26/21 2133 04/27/21 0922 04/27/21 1129 04/27/21 1625  GLUCAP 109* 260* 205* 215* 173*   Lipid Profile: No results for input(s): CHOL, HDL, LDLCALC, TRIG, CHOLHDL, LDLDIRECT in the last 72 hours. Thyroid Function Tests: No results for input(s): TSH, T4TOTAL, FREET4, T3FREE, THYROIDAB in the last 72 hours. Anemia Panel: No results for input(s): VITAMINB12, FOLATE, FERRITIN, TIBC, IRON, RETICCTPCT in the last 72 hours. Urine analysis:    Component Value Date/Time   COLORURINE YELLOW 01/18/2021 [place] 01/18/2021 0950   LABSPEC 1.024 01/18/2021 0950   PHURINE 5.0 01/18/2021 0950   GLUCOSEU >=500 (A) 01/18/2021 0950   HGBUR NEGATIVE 01/18/2021 0950   BILIRUBINUR NEGATIVE 01/18/2021 0950   KETONESUR 5 (A) 01/18/2021 0950   PROTEINUR NEGATIVE 01/18/2021 0950   UROBILINOGEN 1.0 02/27/2012 0001   NITRITE NEGATIVE 01/18/2021 0950   LEUKOCYTESUR NEGATIVE 01/18/2021 0950   No results found for this or any previous visit (from the past 240 hour(s)).    Radiology Studies: DG CHEST PORT 1 VIEW  Result Date: 04/26/2021 CLINICAL DATA:  Status post bronchoscopy and biopsy EXAM: PORTABLE CHEST 1 VIEW COMPARISON:  04/25/2021 FINDINGS: Left chest cardiac device with leads in the right  atrium and ventricle. Unchanged cardiac and mediastinal contours. A surgical clip now projects over the left upper lobe, with any residual nodule possibly obscured by the cardiac device. No focal pulmonary opacity. No pleural effusion or pneumothorax. No acute osseous abnormality. IMPRESSION: No pneumothorax seen, status post bronchoscopy and biopsy. Electronically Signed   By: Merilyn Baba M.D.   On: 04/26/2021 16:03   DG C-ARM BRONCHOSCOPY  Result Date: 04/26/2021 C-ARM BRONCHOSCOPY: Fluoroscopy was utilized by the requesting physician.  No radiographic interpretation.    Scheduled Meds:  amiodarone  400 mg Oral BID   budesonide  2 mL Inhalation BID   Chlorhexidine Gluconate Cloth  6 each Topical Daily   clopidogrel  75 mg Oral Daily   docusate sodium  100 mg Oral BID   empagliflozin  25 mg Oral Daily   insulin aspart  0-5 Units Subcutaneous QHS   insulin aspart  0-9 Units Subcutaneous TID WC   insulin detemir  46 Units Subcutaneous Daily   metoprolol tartrate  50 mg Oral BID   mexiletine  300 mg Oral Q12H   montelukast  10  mg Oral Daily   polycarbophil  625 mg Oral Daily   rosuvastatin  40 mg Oral QHS   sacubitril-valsartan  1 tablet Oral BID   spironolactone  25 mg Oral Daily   Continuous Infusions:  sodium chloride       LOS: 10 days   Time spent: 25 minutes.  Patrecia Pour, MD [facility] [email or web address] 04/27/2021, 4:27 PM

## 2021-04-27 NOTE — Anesthesia Postprocedure Evaluation (Signed)
Anesthesia Post Note  Patient: John [name]  Procedure(s) Performed: VIDEO BRONCHOSCOPY WITH ENDOBRONCHIAL NAVIGATION (Left) BRONCHIAL BIOPSIES BRONCHIAL NEEDLE ASPIRATION BIOPSIES BRONCHIAL BRUSHINGS RADIAL ENDOBRONCHIAL ULTRASOUND FIDUCIAL MARKER PLACEMENT     Patient location during evaluation: PACU Anesthesia Type: General Level of consciousness: awake and alert Pain management: pain level controlled Vital Signs Assessment: post-procedure vital signs reviewed and stable Respiratory status: spontaneous breathing, nonlabored ventilation, respiratory function stable and patient connected to nasal cannula oxygen Cardiovascular status: blood pressure returned to baseline and stable Postop Assessment: no apparent nausea or vomiting Anesthetic complications: no   No notable events documented.  Last Vitals:  Vitals:   04/27/21 0415 04/27/21 0737  BP: 125/86 132/85  Pulse: 70 74  Resp: 18 19  Temp: 37.3 C 36.5 C  SpO2: 97% 97%    Last Pain:  Vitals:   04/27/21 0737  TempSrc: Oral  PainSc:                  [name]

## 2021-04-28 ENCOUNTER — Other Ambulatory Visit: Payer: Self-pay | Admitting: Nurse Practitioner

## 2021-04-28 DIAGNOSIS — I472 Ventricular tachycardia, unspecified: Secondary | ICD-10-CM | POA: Diagnosis not present

## 2021-04-28 DIAGNOSIS — R911 Solitary pulmonary nodule: Secondary | ICD-10-CM | POA: Diagnosis not present

## 2021-04-28 LAB — BASIC METABOLIC PANEL
Anion gap: 6 (ref 5–15)
BUN: 25 mg/dL — ABNORMAL HIGH (ref 8–23)
CO2: 24 mmol/L (ref 22–32)
Calcium: 8.9 mg/dL (ref 8.9–10.3)
Chloride: 107 mmol/L (ref 98–111)
Creatinine, Ser: 1.32 mg/dL — ABNORMAL HIGH (ref 0.61–1.24)
GFR, Estimated: 57 mL/min — ABNORMAL LOW (ref >60)
Glucose, Bld: 112 mg/dL — ABNORMAL HIGH (ref 70–99)
Potassium: 4.3 mmol/L (ref 3.5–5.1)
Sodium: 137 mmol/L (ref 135–145)

## 2021-04-28 LAB — MAGNESIUM: Magnesium: 2.3 mg/dL (ref 1.7–2.4)

## 2021-04-28 LAB — GLUCOSE, CAPILLARY
Glucose-Capillary: 143 mg/dL — ABNORMAL HIGH (ref 70–99)
Glucose-Capillary: 139 mg/dL — ABNORMAL HIGH (ref 70–99)
Glucose-Capillary: 156 mg/dL — ABNORMAL HIGH (ref 70–99)
Glucose-Capillary: 162 mg/dL — ABNORMAL HIGH (ref 70–99)

## 2021-04-28 NOTE — Progress Notes (Signed)
X-cover note  RN reports pt stated to her that his ICD fired. Event not caught on telemetry. RN states that pt states is it possible he dreamt that his ICD fired. Asked RN to have ICD interrogated.

## 2021-04-28 NOTE — Progress Notes (Signed)
Pt stated his defib fired. Woke him out of his sleep. Tele did not see event. EKG done. Vitals done. Pt stable. Notified charge & provider.

## 2021-04-28 NOTE — Progress Notes (Signed)
Palliative:  Chart reviewed. Lung biopsy completed yesterday. Plans to follow up with rad onc outpatient.   Referral made to outpatient palliative care at cancer center.  Please send epic chat or call if additional needs for inpatient palliative care are identified.   Juel Burrow, DNP, AGNP-C Palliative Medicine Team Team Phone # [phone]  Pager # [phone]  NO CHARGE

## 2021-04-28 NOTE — Progress Notes (Signed)
PROGRESS NOTE  John [name]  ZOX:[id number] DOB: 1950-08-06 DOA: 04/17/2021 PCP: [name], MD   Brief Narrative: 70 year old male with past medical history significant for CHF, CAD, ILD, and VT s/p ablation and recurrent VT who presented on 04/17/2021 for sustained VT requiring cardioversion in the ER.  He was admitted on EP's service for management of his arrhythmia.    Cardiac rhythm has improved and patient being loaded with oral amiodarone.     Patient has a rapidly enlarging right upper lobe lung nodule that there was planned biopsy on Tuesday with Dr. Valeta Harms.  Due to a recent drug-eluting stent, patient requires DAPT.  He is being bridged with IV antiplatelet therapy and awaiting Plavix washout prior to bronchoscopy.  Assessment & Plan: Principal Problem:   Lung nodule Active Problems:   VT (ventricular tachycardia)  Recurrent VT: s/p ablation Aug 2022, dual chamber ICD implanted 08/20/2020. - EP following, transitioned to oral amiodarone 11/9, request observation with this. Had phantom shock 11/10, if maintaining NSR 11/11, can DC per my discussion with EP. - Continue mexiletine, metoprolol. Stopped sotalol and will not restart. - Maintain K, Mg levels. K 4.3, Mg 2.3.  - 6 months driving restrictions have been shared with patient.   Atrial flutter: CHA2DS2-VASc score is 5, will restart eliquis 11/11.    CAD s/p stents, ICM: No anginal complaints currently. s/p antiplatelet bridging pre-bronch/biopsy, now restarted plavix with load 11/8. - Continue plavix, statin, BB  Chronic HFrEF:  - Continue metoprolol, entresto, spironolactone, empagliflozin.  - Monitor I/O, weights.   RUL lung mass:  - Follow up pathology from nav bronch 11/8 by Dr. Valeta Harms. Remains pending on recheck this AM.  T2DM:  - Continue levemir, SSI - Continue empagliflozin  Asthma:  - Continue singulair, budesonide  Post LHC CVA:  - Plavix, statin as above. Anticoagulation reinitiation timing is  per EP.  Leukocytosis: Isolated without fever or nidus of infection. suspect reactive to bronch.  Monitoring off abx.  DVT prophylaxis: SCDs Code Status: Full Family Communication: None at bedside Disposition Plan:  Status is: Inpatient  Remains inpatient appropriate because: Close postprocedural observation required, hopeful for DC 11/11.  Consultants:  EP Pulmonary  Procedures:  Navigational bronchoscopy with biopsy 04/26/2021 Dr. Valeta Harms  Antimicrobials: None   Subjective: Awoke from sleep with sensation that ICD had fired, though no VT or shock noted on telemetry/interrogation. He reports no new concerns this morning. No persistent chest pain or dyspnea or palpitations or bleeding.   Objective: Vitals:   04/28/21 0755 04/28/21 0758 04/28/21 1140 04/28/21 1347  BP: 128/75  117/80 113/79  Pulse:  71  70  Resp:  18 18 17   Temp:    98.5 F (36.9 C)  TempSrc:    Oral  SpO2:  96% 96% 97%  Weight:      Height:        Intake/Output Summary (Last 24 hours) at 04/28/2021 1639 Last data filed at 04/28/2021 0616 Gross per 24 hour  Intake --  Output 100 ml  Net -100 ml   Filed Weights   04/26/21 0424 04/27/21 0415 04/28/21 0611  Weight: 93.8 kg 93.4 kg 94 kg   Gen: 70 y.o. male in no distress Pulm: Nonlabored breathing room air. Clear, though RUL has mild coarse sounds on expiration. CV: Regular rate and rhythm. NSR on monitor. No murmur, rub, or gallop. No JVD, no dependent edema. GI: Abdomen soft, non-tender, non-distended, with normoactive bowel sounds.  Ext: Warm, no deformities  Skin: No rashes, lesions or ulcers on visualized skin. Hardware L upper chest nontender. Neuro: Alert and oriented. No focal neurological deficits. Psych: Judgement and insight appear fair. Mood euthymic & affect congruent. Behavior is appropriate.    Data Reviewed: I have personally reviewed following labs and imaging studies  CBC: Recent Labs  Lab 04/22/21 0134 04/25/21 0436  04/27/21 0430  WBC 8.0 6.7 13.0*  HGB 17.8* 16.7 18.0*  HCT 56.0* 49.9 55.9*  MCV 93.0 93.1 93.6  PLT 168 182 073   Basic Metabolic Panel: Recent Labs  Lab 04/24/21 0207 04/25/21 0436 04/26/21 0218 04/27/21 0430 04/28/21 0241  NA 139 140 140 137 137  K 3.8 4.0 4.0 4.4 4.3  CL 110 110 108 105 107  CO2 24 24 26 23 24   GLUCOSE 135* 106* 116* 175* 112*  BUN 21 17 18 18  25*  CREATININE 1.13 1.07 1.39* 1.31* 1.32*  CALCIUM 8.5* 8.5* 8.5* 9.2 8.9  MG 2.1 2.2 2.1 2.2 2.3   GFR: Estimated Creatinine Clearance: 60.2 mL/min (A) (by C-G formula based on SCr of 1.32 mg/dL (H)). Liver Function Tests: No results for input(s): AST, ALT, ALKPHOS, BILITOT, PROT, ALBUMIN in the last 168 hours. No results for input(s): LIPASE, AMYLASE in the last 168 hours. No results for input(s): AMMONIA in the last 168 hours. Coagulation Profile: No results for input(s): INR, PROTIME in the last 168 hours. Cardiac Enzymes: No results for input(s): CKTOTAL, CKMB, CKMBINDEX, TROPONINI in the last 168 hours. BNP (last 3 results) No results for input(s): PROBNP in the last 8760 hours. HbA1C: No results for input(s): HGBA1C in the last 72 hours. CBG: Recent Labs  Lab 04/27/21 1625 04/27/21 2119 04/28/21 0737 04/28/21 1131 04/28/21 1600  GLUCAP 173* 193* 143* 139* 156*   Lipid Profile: No results for input(s): CHOL, HDL, LDLCALC, TRIG, CHOLHDL, LDLDIRECT in the last 72 hours. Thyroid Function Tests: No results for input(s): TSH, T4TOTAL, FREET4, T3FREE, THYROIDAB in the last 72 hours. Anemia Panel: No results for input(s): VITAMINB12, FOLATE, FERRITIN, TIBC, IRON, RETICCTPCT in the last 72 hours. Urine analysis:    Component Value Date/Time   COLORURINE YELLOW 01/18/2021 [place] 01/18/2021 0950   LABSPEC 1.024 01/18/2021 0950   PHURINE 5.0 01/18/2021 0950   GLUCOSEU >=500 (A) 01/18/2021 0950   HGBUR NEGATIVE 01/18/2021 0950   BILIRUBINUR NEGATIVE 01/18/2021 0950    KETONESUR 5 (A) 01/18/2021 0950   PROTEINUR NEGATIVE 01/18/2021 0950   UROBILINOGEN 1.0 02/27/2012 0001   NITRITE NEGATIVE 01/18/2021 0950   LEUKOCYTESUR NEGATIVE 01/18/2021 0950   No results found for this or any previous visit (from the past 240 hour(s)).    Radiology Studies: No results found.  Scheduled Meds:  amiodarone  400 mg Oral BID   budesonide  2 mL Inhalation BID   Chlorhexidine Gluconate Cloth  6 each Topical Daily   clopidogrel  75 mg Oral Daily   docusate sodium  100 mg Oral BID   empagliflozin  25 mg Oral Daily   insulin aspart  0-5 Units Subcutaneous QHS   insulin aspart  0-9 Units Subcutaneous TID WC   insulin detemir  46 Units Subcutaneous Daily   metoprolol tartrate  50 mg Oral BID   mexiletine  300 mg Oral Q12H   montelukast  10 mg Oral Daily   polycarbophil  625 mg Oral Daily   rosuvastatin  40 mg Oral QHS   sacubitril-valsartan  1 tablet Oral BID   spironolactone  25 mg Oral  Daily   Continuous Infusions:  sodium chloride       LOS: 11 days   Time spent: 25 minutes.  Patrecia Pour, MD [facility] [email or web address] 04/28/2021, 4:39 PM

## 2021-04-28 NOTE — Progress Notes (Addendum)
Progress Note   Subjective   Had a nightmare last night, otherwise feels well  Inpatient Medications    Scheduled Meds:  amiodarone  400 mg Oral BID   budesonide  2 mL Inhalation BID   Chlorhexidine Gluconate Cloth  6 each Topical Daily   clopidogrel  75 mg Oral Daily   docusate sodium  100 mg Oral BID   empagliflozin  25 mg Oral Daily   insulin aspart  0-5 Units Subcutaneous QHS   insulin aspart  0-9 Units Subcutaneous TID WC   insulin detemir  46 Units Subcutaneous Daily   metoprolol tartrate  50 mg Oral BID   mexiletine  300 mg Oral Q12H   montelukast  10 mg Oral Daily   polycarbophil  625 mg Oral Daily   rosuvastatin  40 mg Oral QHS   sacubitril-valsartan  1 tablet Oral BID   spironolactone  25 mg Oral Daily   Continuous Infusions:  sodium chloride     PRN Meds: acetaminophen, albuterol, hydrALAZINE, hydrocortisone cream, nitroGLYCERIN, ondansetron (ZOFRAN) IV, zolpidem   Vital Signs    Vitals:   04/27/21 2100 04/28/21 0113 04/28/21 0611 04/28/21 0758  BP:  125/76 (!) 111/94   Pulse:  69 72 71  Resp:  18 18 18   Temp:   (!) 97.5 F (36.4 C)   TempSrc:   Oral   SpO2: 96% 96% 96% 96%  Weight:   94 kg   Height:        Intake/Output Summary (Last 24 hours) at 04/28/2021 0814 Last data filed at 04/28/2021 0616 Gross per 24 hour  Intake --  Output 100 ml  Net -100 ml    Filed Weights   04/26/21 0424 04/27/21 0415 04/28/21 0611  Weight: 93.8 kg 93.4 kg 94 kg    Telemetry    A pacing, come PVCs, no VT /no shocks- Personally Reviewed  Physical Exam    Exam remains unchanged GEN- The patient is in no distress, alert and oriented x 3 today.   Head- normocephalic, atraumatic Eyes-  Sclera clear, conjunctiva pink Ears- hearing intact Oropharynx- clear Neck- supple, Lungs-  normal work of breathing Heart- RRR  GI- soft  Extremities- no clubbing, cyanosis, or edema  MS- no significant deformity or atrophy Skin- no rash or lesion Psych- euthymic  mood, full affect Neuro- strength and sensation are intact   Labs    Chemistry Recent Labs  Lab 04/26/21 0218 04/27/21 0430 04/28/21 0241  NA 140 137 137  K 4.0 4.4 4.3  CL 108 105 107  CO2 26 23 24   GLUCOSE 116* 175* 112*  BUN 18 18 25*  CREATININE 1.39* 1.31* 1.32*  CALCIUM 8.5* 9.2 8.9  GFRNONAA 54* 58* 57*  ANIONGAP 6 9 6      Hematology Recent Labs  Lab 04/22/21 0134 04/25/21 0436 04/27/21 0430  WBC 8.0 6.7 13.0*  RBC 6.02* 5.36 5.97*  HGB 17.8* 16.7 18.0*  HCT 56.0* 49.9 55.9*  MCV 93.0 93.1 93.6  MCH 29.6 31.2 30.2  MCHC 31.8 33.5 32.2  RDW 13.9 13.4 13.1  PLT 168 182 215     Patient ID  John [name] is a 70 y.o. male with a past medical history significant for CHF, CAD (02/14/21 with CP, NSTEMI, underwent cath and PCI  (Difficult for successful PTCA/PCI using 3.5 mm ScoreFlex balloon multiple inflations with post dilation of the previously stented) overlapping Onyx Frontier DES 2.75 mm x 22 mm placed overlapping distally); prox RCA ~50%, prox-distal  RCA 35%.  Unfortunately suffered a stroke), AFlutter, DM, ILD, and VT s/p ablation. Lung mass  he was admitted again this admission for VT storm.    VT ablation 01/2021 At that time to distinct ventricular tachycardias were noted.  One was a posterolateral VT that was successfully ablated near the papillary muscle.  A second VT was not targeted for ablation but was described as a left superior axis VT   Device information MDT dual chamber ICD implanted 08/20/20 Secondary prevention w/hx of hemodynamically unstable MMVT   AAD hx 2018 Pulmonary function studies have shown restriction with low DLCO.  He felt possibly also to have asthma based on nitric oxide testing.  With his lung disease it has been recommended that amiodarone be considered for discontinuance Looks like amio was stopped in 2019 Follows with pulmonology March 2022 mexilletine > recurrent VT higher doses were too expensive June 2022 sotalol  started Aug 2022 more VT and mexiletine resumed 01/18/21, VT ablation 02/09/21, device interrogation done for MRI noted VT treated with ATP and mexiletine ordered for increase to 300mg  BID >>>> THIS DID NOT GET TRANSLATED ON DISCHARGE AND remained at 147ms 02/25/21 VT storm,  mexiletine increased 04/17/21: VT, symptomatic/intolerable, amiodarone added, sotalol stopped  Assessment & Plan    1.  VT Stopped home sotalol > amiodarone this admission No VT yesterday again PO amiodarone yesterday Continue mexiletine, lopressor  Phantom shock/nightmare last night No driving x 6 months   2. Ischemic CM/ CAD No ischemic symptoms Euvolemic Plavix loading > daily    3. Lung mass S/p Flexible video fiberoptic bronchoscopy with robotic assistance and biopsies 04/26/21 Pulm signed off Out patient follow up with them and oncology    4. AFlutter CHA2DS2Vasc is 5 Maintaining SR, resume at time of discharge    Anticipate d/c tomorrow if rhythm remains ok     John Standard, PA-C 04/28/2021 8:14 AM  I have seen and examined this patient with John [name].  Agree with above, note added to reflect my findings.  On exam, RRR, no murmurs, lungs clear. Patient did well on PO amiodarone. Had nightmare overnight where he felt like he had an ICD shock but no arrhythmia noted. Would continue amiodarone 400 mg BID throughout the day today and potentially discharge tomorrow.    John Wehmeyer M. Alira Fretwell MD 04/29/2021 7:14 AM

## 2021-04-29 ENCOUNTER — Inpatient Hospital Stay (HOSPITAL_COMMUNITY): Payer: Medicare Other

## 2021-04-29 DIAGNOSIS — L538 Other specified erythematous conditions: Secondary | ICD-10-CM

## 2021-04-29 DIAGNOSIS — M7989 Other specified soft tissue disorders: Secondary | ICD-10-CM

## 2021-04-29 DIAGNOSIS — M79602 Pain in left arm: Secondary | ICD-10-CM

## 2021-04-29 DIAGNOSIS — R911 Solitary pulmonary nodule: Secondary | ICD-10-CM | POA: Diagnosis not present

## 2021-04-29 LAB — CBC
HCT: 51.5 % (ref 39.0–52.0)
Hemoglobin: 16.5 g/dL (ref 13.0–17.0)
MCH: 29.9 pg (ref 26.0–34.0)
MCHC: 32 g/dL (ref 30.0–36.0)
MCV: 93.5 fL (ref 80.0–100.0)
Platelets: 214 10*3/uL (ref 150–400)
RBC: 5.51 MIL/uL (ref 4.22–5.81)
RDW: 13.5 % (ref 11.5–15.5)
WBC: 8.7 10*3/uL (ref 4.0–10.5)
nRBC: 0 % (ref 0.0–0.2)

## 2021-04-29 LAB — BASIC METABOLIC PANEL
Anion gap: 7 (ref 5–15)
BUN: 22 mg/dL (ref 8–23)
CO2: 24 mmol/L (ref 22–32)
Calcium: 8.5 mg/dL — ABNORMAL LOW (ref 8.9–10.3)
Chloride: 107 mmol/L (ref 98–111)
Creatinine, Ser: 1.17 mg/dL (ref 0.61–1.24)
GFR, Estimated: >60 mL/min (ref >60)
Glucose, Bld: 104 mg/dL — ABNORMAL HIGH (ref 70–99)
Potassium: 5.1 mmol/L (ref 3.5–5.1)
Sodium: 138 mmol/L (ref 135–145)

## 2021-04-29 LAB — GLUCOSE, CAPILLARY
Glucose-Capillary: 121 mg/dL — ABNORMAL HIGH (ref 70–99)
Glucose-Capillary: 120 mg/dL — ABNORMAL HIGH (ref 70–99)

## 2021-04-29 LAB — MAGNESIUM: Magnesium: 2.4 mg/dL (ref 1.7–2.4)

## 2021-04-29 MED ORDER — SODIUM CHLORIDE 0.9 % IV SOLN
1.0000 g | Freq: Once | INTRAVENOUS | Status: AC
  Administered 2021-04-29: 1 g via INTRAVENOUS
  Filled 2021-04-29: qty 10

## 2021-04-29 MED ORDER — AMIODARONE HCL 400 MG PO TABS: 400.0000 mg | ORAL_TABLET | Freq: Two times a day (BID) | ORAL | 1 refills | Status: DC

## 2021-04-29 MED ORDER — SPIRONOLACTONE 25 MG PO TABS: 12.5000 mg | ORAL_TABLET | Freq: Every day | ORAL | 0 refills | Status: DC

## 2021-04-29 MED ORDER — CEPHALEXIN 500 MG PO CAPS: 500.0000 mg | ORAL_CAPSULE | Freq: Three times a day (TID) | ORAL | 0 refills | Status: AC

## 2021-04-29 MED ORDER — METOPROLOL TARTRATE 50 MG PO TABS: 50.0000 mg | ORAL_TABLET | Freq: Two times a day (BID) | ORAL | 5 refills | Status: DC

## 2021-04-29 MED ORDER — APIXABAN 5 MG PO TABS
10.0000 mg | ORAL_TABLET | Freq: Once | ORAL | Status: AC
  Administered 2021-04-29: 10 mg via ORAL
  Filled 2021-04-29: qty 2

## 2021-04-29 MED ORDER — APIXABAN 5 MG PO TABS
ORAL_TABLET | ORAL | 0 refills | Status: DC
Start: 2021-04-29 — End: 2021-05-30

## 2021-04-29 MED ORDER — MEXILETINE HCL 150 MG PO CAPS: 300.0000 mg | ORAL_CAPSULE | Freq: Two times a day (BID) | ORAL | 5 refills | Status: DC

## 2021-04-29 MED ORDER — ROSUVASTATIN CALCIUM 40 MG PO TABS: 40.0000 mg | ORAL_TABLET | Freq: Every day | ORAL | 0 refills | Status: DC

## 2021-04-29 NOTE — Progress Notes (Signed)
ANTICOAGULATION CONSULT NOTE - Initial Consult  Pharmacy Consult for DVT  Indication: atrial fibrillation and DVT  Allergies  Allergen Reactions   Omega-3 Fatty Acids Hives and Itching   Benazepril Other (See Comments)    hyperkalemia   Fish Allergy Itching    Patient Measurements: Height: 6' (182.9 cm) Weight: 94 kg (207 lb 3.7 oz) IBW/kg (Calculated) : 77.6  Vital Signs: Temp: 97.3 F (36.3 C) (11/11 0927) Temp Source: Oral (11/11 0927) BP: 129/84 (11/11 0927) Pulse Rate: 70 (11/11 0840)  Labs: Recent Labs    04/27/21 0430 04/28/21 0241 04/29/21 0313  HGB 18.0*  --  16.5  HCT 55.9*  --  51.5  PLT 215  --  214  CREATININE 1.31* 1.32* 1.17    Estimated Creatinine Clearance: 68 mL/min (by C-G formula based on SCr of 1.17 mg/dL).   Medical History: Past Medical History:  Diagnosis Date   Atrial flutter ([place])    s/p cardioversion   Coronary artery disease    Diabetes mellitus    GERD (gastroesophageal reflux disease)    History of nuclear stress test 04/04/2011   lexiscan; mod-large in size fixed inferolateral defect (scar); non-diagnostic for ischemia; low risk scan    Hyperlipidemia    Hypertension    Left foot drop    r/t past disk srugery - uses Kevlar brace   Myocardial infarction ([facility])    posterior MI   Shortness of breath    Sleep apnea    on CPAP; 04/28/2007 split-night - AHI during total sleep 44.43/hr and REM 72.56/hr    Medications:  Scheduled:   amiodarone  400 mg Oral BID   apixaban  10 mg Oral Once   budesonide  2 mL Inhalation BID   Chlorhexidine Gluconate Cloth  6 each Topical Daily   clopidogrel  75 mg Oral Daily   docusate sodium  100 mg Oral BID   empagliflozin  25 mg Oral Daily   insulin aspart  0-5 Units Subcutaneous QHS   insulin aspart  0-9 Units Subcutaneous TID WC   insulin detemir  46 Units Subcutaneous Daily   metoprolol tartrate  50 mg Oral BID   mexiletine  300 mg Oral Q12H   montelukast  10 mg Oral Daily    polycarbophil  625 mg Oral Daily   rosuvastatin  40 mg Oral QHS   sacubitril-valsartan  1 tablet Oral BID   spironolactone  25 mg Oral Daily    Assessment: 59 yom who came in for recurrent VT and lung biopsy - was on apixaban PTA for hx Afib. Found to have DVT for axillary vein on 11/11.    On plavix for recent stents - Hgb 16.5, plt 214. No s/sx of bleeding. Discussed with team and will restart apixaban at 10 mg BID for 7 days then 5 mg BID thereafter.  Goal of Therapy:  Monitor platelets by anticoagulation protocol: Yes   Plan:  Apixaban 10 mg BID for 7 days then 5 mg BID thereafter Monitor CBC and for s/sx of bleeding   Antonietta Jewel, PharmD, [place] Pharmacist  Phone: [phone] 04/29/2021 11:16 AM  Please check AMION for all [place] phone numbers After 10:00 PM, call [place] [phone]

## 2021-04-29 NOTE — Progress Notes (Addendum)
Progress Note   Subjective   LUE painful, otherwise feels OK, hopes he can still go home  Inpatient Medications    Scheduled Meds:  amiodarone  400 mg Oral BID   budesonide  2 mL Inhalation BID   Chlorhexidine Gluconate Cloth  6 each Topical Daily   clopidogrel  75 mg Oral Daily   docusate sodium  100 mg Oral BID   empagliflozin  25 mg Oral Daily   insulin aspart  0-5 Units Subcutaneous QHS   insulin aspart  0-9 Units Subcutaneous TID WC   insulin detemir  46 Units Subcutaneous Daily   metoprolol tartrate  50 mg Oral BID   mexiletine  300 mg Oral Q12H   montelukast  10 mg Oral Daily   polycarbophil  625 mg Oral Daily   rosuvastatin  40 mg Oral QHS   sacubitril-valsartan  1 tablet Oral BID   spironolactone  25 mg Oral Daily   Continuous Infusions:  sodium chloride     PRN Meds: acetaminophen, albuterol, hydrALAZINE, hydrocortisone cream, nitroGLYCERIN, ondansetron (ZOFRAN) IV, zolpidem   Vital Signs    Vitals:   04/28/21 1140 04/28/21 1347 04/28/21 2032 04/29/21 0548  BP: 117/80 113/79 118/79 134/86  Pulse:  70 70 71  Resp: 18 17 18 18   Temp:  98.5 F (36.9 C) 97.8 F (36.6 C) (!) 97.5 F (36.4 C)  TempSrc:  Oral Oral Oral  SpO2: 96% 97% 97% 93%  Weight:      Height:       No intake or output data in the 24 hours ending 04/29/21 0800   Filed Weights   04/26/21 0424 04/27/21 0415 04/28/21 0611  Weight: 93.8 kg 93.4 kg 94 kg    Telemetry    A pacing, come PVCs, no VT /no shocks- Personally Reviewed  Physical Exam    Exam remains unchanged GEN- The patient is in no distress, alert and oriented x 3 today.   Head- normocephalic, atraumatic Eyes-  Sclera clear, conjunctiva pink Ears- hearing intact Oropharynx- clear Neck- supple, Lungs-  normal work of breathing Heart- RRR  GI- soft  Extremities- no clubbing, cyanosis, or LE edema, LUE forearm is swollen, erythematous to posterior aspect/warm/tender (not appreciated yesterday) MS- no significant  deformity or atrophy Skin- no rash or lesion Psych- euthymic mood, full affect Neuro- strength and sensation are intact   Labs    Chemistry Recent Labs  Lab 04/27/21 0430 04/28/21 0241 04/29/21 0313  NA 137 137 138  K 4.4 4.3 5.1  CL 105 107 107  CO2 23 24 24   GLUCOSE 175* 112* 104*  BUN 18 25* 22  CREATININE 1.31* 1.32* 1.17  CALCIUM 9.2 8.9 8.5*  GFRNONAA 58* 57* >60  ANIONGAP 9 6 7      Hematology Recent Labs  Lab 04/25/21 0436 04/27/21 0430 04/29/21 0313  WBC 6.7 13.0* 8.7  RBC 5.36 5.97* 5.51  HGB 16.7 18.0* 16.5  HCT 49.9 55.9* 51.5  MCV 93.1 93.6 93.5  MCH 31.2 30.2 29.9  MCHC 33.5 32.2 32.0  RDW 13.4 13.1 13.5  PLT 182 215 214     Patient ID  John [name] is a 70 y.o. male with a past medical history significant for CHF, CAD (02/14/21 with CP, NSTEMI, underwent cath and PCI  (Difficult for successful PTCA/PCI using 3.5 mm ScoreFlex balloon multiple inflations with post dilation of the previously stented) overlapping Onyx Frontier DES 2.75 mm x 22 mm placed overlapping distally); prox RCA ~50%, prox-distal RCA  35%.  Unfortunately suffered a stroke), AFlutter, DM, ILD, and VT s/p ablation. Lung mass  he was admitted again this admission for VT storm.    VT ablation 01/2021 At that time to distinct ventricular tachycardias were noted.  One was a posterolateral VT that was successfully ablated near the papillary muscle.  A second VT was not targeted for ablation but was described as a left superior axis VT   Device information MDT dual chamber ICD implanted 08/20/20 Secondary prevention w/hx of hemodynamically unstable MMVT   AAD hx 2018 Pulmonary function studies have shown restriction with low DLCO.  He felt possibly also to have asthma based on nitric oxide testing.  With his lung disease it has been recommended that amiodarone be considered for discontinuance Looks like amio was stopped in 2019 Follows with pulmonology March 2022 mexilletine >  recurrent VT higher doses were too expensive June 2022 sotalol started Aug 2022 more VT and mexiletine resumed 01/18/21, VT ablation 02/09/21, device interrogation done for MRI noted VT treated with ATP and mexiletine ordered for increase to 300mg  BID >>>> THIS DID NOT GET TRANSLATED ON DISCHARGE AND remained at 148ms 02/25/21 VT storm,  mexiletine increased 04/17/21: VT, symptomatic/intolerable, amiodarone added, sotalol stopped  Assessment & Plan    1.  VT Stopped home sotalol > amiodarone this admission No VT last 48+ hours PO amiodarone  Continue mexiletine, lopressor  No driving x 6 months   2. Ischemic CM/ CAD No ischemic symptoms Euvolemic Plavix loading done > daily    3. Lung mass S/p Flexible video fiberoptic bronchoscopy with robotic assistance and biopsies 04/26/21 Pulm signed off Out patient follow up with them and oncology    4. AFlutter CHA2DS2Vasc is 5 Maintaining SR, ok to resume at time of discharge  5. LUE likely IVF infiltration [place] is ordered, pending deferred to IM    Dr. Curt Bears has seen and examined the patient, OK from EP perspective to discharge when ready medically otherwise Amiodarone 400mg  BID x15month, EP Jamilette Suchocki see for titration out patient [name] 300mg  BID Lopressor 50mg  BID  STOP sotalol    Tommye Standard, PA-C 04/29/2021 8:00 AM  I have seen and examined this patient with Tommye Standard.  Agree with above, note added to reflect my findings.  On exam, RRR, no murmurs, lungs clear.  Only complaint today is left arm pain and swelling.  He has had no further ventricular tachycardia.  Ultrasound of his left arm shows an acute DVT with superficial vein thrombosis as well.  His pain has been occurring for the last few days.  Okay with starting Eliquis for anticoagulation for therapy for his DVT.  At this point, since he has had no further VT, would be okay with discharge.  Laray Rivkin discharge with amiodarone and mexiletine as above.  Would stop  sotalol.  We Leng Montesdeoca arrange for follow-up in EP clinic.  Jalon Blackwelder M. Yoshito Gaza MD 04/29/2021 9:55 AM

## 2021-04-29 NOTE — Care Management Important Message (Signed)
Important Message  Patient Details  Name: John [name] MRN: [id number] Date of Birth: 1950-11-29   Medicare Important Message Given:  Yes     Shelda Altes 04/29/2021, 12:43 PM

## 2021-04-29 NOTE — Discharge Summary (Signed)
Physician Discharge Summary  John [name] XIH:[id number] DOB: 1951/02/07 DOA: 04/17/2021  PCP: [name], MD  Admit date: 04/17/2021 Discharge date: 04/29/2021  Admitted From: Home Disposition: Home   Recommendations for Outpatient Follow-up:  Follow up with EP as scheduled for continued management of VT, continuing amiodarone oral load at discharge and other medications per their recommendations.  STOP sotalol Start amiodarone load with 400mg  po BID, to be adjusted at EP follow up Continue metoprolol 50mg  BID with plans to consolidate to metoprolol succinate at follow up. Increased mexiletine to 300mg  BID Start spironolactone 12.5mg  daily Increase crestor to 40mg . Restart plavix post biopsy, and give eliquis 10mg  twice daily due to acute left axillary vein DVT that occurred in the setting of a required holding of anticoagulation. Follow up with pulmonary, s/p lung mass biopsy with pathology still pending at time of DC.   Home Health: None - outpatient PT referral Equipment/Devices: None Discharge Condition: Stable CODE STATUS: Full Diet recommendation: Heart healthy, carb-modified  Brief/Interim Summary: John [name] is a 70 year old male with past medical history significant for CHF, CAD, ILD, and VT s/p ablation and recurrent VT who presented on 04/17/2021 for sustained VT requiring cardioversion in the ER.  He was admitted on EP's service for management of his arrhythmia. Cardiac rhythm has improved and patient being loaded with oral amiodarone.     Patient has a rapidly enlarging right upper lobe lung nodule that there was planned biopsy on Tuesday with Dr. Valeta Harms.  Due to a recent drug-eluting stent, patient requires DAPT, so he was bridged with IV antiplatelet therapy for plavix washout prior to bronchoscopy with biopsy which was performed 11/8, though pathology report is still pending at time of discharge. While holding eliquis, the patient's left arm began  swelling, becoming tender and an U/S confirmed acute axillary vein DVT in addition to SVTs in basilic and cephalic veins for which treatment dose eliquis is restarted.   With controlled heart rhythm and clinical stability, he's been cleared for discharge by EP and will follow up with cardiology and pulmonology after discharge.   Discharge Diagnoses:  Principal Problem:   Lung nodule Active Problems:   VT (ventricular tachycardia)  Recurrent VT: s/p ablation Aug 2022, dual chamber ICD implanted 08/20/2020. - EP following, transitioned to oral amiodarone 11/9, request observation with this. Had phantom shock 11/10, but rhythm stable, cleared for DC by EP. - Continue mexiletine, metoprolol. Stopped sotalol. - Monitor K and Mg levels at follow up. - 6 months driving restrictions have been shared with patient.    Acute left axillary vein DVT and SVTs of cephalic and basilic veins:  - Initiate eliquis at 10mg  BID dose x7 days, then return to indefinite 5mg  BID.  - Elevated extremity  Left forearm cellulitis: Originating about a site from infiltrated IV has not improved as expected if that were the cause.  - Will treat with antibiotics for nonpurulent cellulitis.   Atrial flutter: CHA2DS2-VASc score is 5, will restart eliquis 11/11.    CAD s/p stents, ICM: No anginal complaints currently. s/p antiplatelet bridging pre-bronch/biopsy, now restarted plavix with load 11/8. - Continue plavix, statin, BB   HLD: LDL still >70, has tolerated crestor 40mg  and will be augmented as outpatient.   Chronic HFrEF: Compensated. - Continue metoprolol, entresto, spironolactone, empagliflozin.    RUL lung mass:  - Follow up pathology from nav bronch 11/8 by Dr. Valeta Harms. Remains pending on recheck at discharge.   T2DM:  - Continue home  medications.   Asthma:  - Continue singulair, budesonide   Post LHC CVA:  - Plavix, statin as above. Anticoagulation reinitiation now.   Leukocytosis: Isolated,  resolved, without fever or nidus of infection. Suspect reactive to bronch.  Monitoring off abx.  Discharge Instructions Discharge Instructions     AMB Referral to [facility] [address] [facility]   Complete by: As directed    Referral:  [place]:  [name], MD Embedded CCM - patient with extreme high risk scores, LLOS 10 days - states if he doesn't answer calls can call his wife [name] [phone], patient prefers OP rehab at [facility]  Please assign to [place] for chronic care management follow up calls and assess for further needs.  Questions please call:   Natividad Brood, RN BSN [facility] Liaison  [phone] business mobile phone Toll free office [phone]  Fax number: [phone] [email or web address]   Reason for Referral: Embedded Chronic Care Management Services (Dept. specific)   Disease management services needed: Nurse Case Manager   Diagnoses of:  Heart Failure COPD/ Pneumonia Other     Other Diagnosis: Lung nodule   Expected date of contact: Routine - 30 Days   Ambulatory referral to Physical Therapy   Complete by: As directed    Evaluation and treatment for Outpatient Physical Therapy   Diet - low sodium heart healthy   Complete by: As directed    Discharge instructions   Complete by: As directed    You were admitted for ventricular tachycardia which has resolved with return of normal heart rhythm as medications were adjusted. Electrophysiology, Dr. Curt Bears, has recommended that you:  - STOP sotalol - Take amiodarone 400mg  twice daily until you follow up in the clinic (scheduled for you) - Take mexiletine 300mg  twice daily - Take metoprolol 50mg  twice daily - Take spironolactone 12.5mg  (1/2 tablet) once daily - Take crestor 40mg  (not just 20mg ) once daily to optimize your cholesterol profile.  Due to concern for a possible cellulitis/skin  infection:  - You should take keflex three times daily for 6 more days (first dose tomorrow morning) to treat a skin infection in the left forearm.  - Keep the area elevated as well.   To treat an acute DVT (deep vein clot) in the left axillary vein:  - You will need to take an increased dose of eliquis (10mg  instead of 5mg ) twice daily for a week, then you can return to your usual 5mg  twice daily. - Continue plavix as well  The pathology report from the biopsy of the lung mass is still pending at the time of discharge. You will need to schedule a follow up appointment with Dr. Valeta Harms if you do not hear from his office to discuss these results and next steps.   If you experience any shock, chest pain, trouble breathing, fever, spreading redness or swelling or pain in the arm, or uncontrolled bleeding, seek medical attention right away.   Increase activity slowly   Complete by: As directed    No wound care   Complete by: As directed       Allergies as of 04/29/2021       Reactions   Omega-3 Fatty Acids Hives, Itching   Benazepril Other (See Comments)   hyperkalemia   Fish Allergy Itching        Medication List     STOP taking these medications    sotalol 160 MG tablet Commonly known as:  BETAPACE       TAKE these medications    albuterol 108 (90 Base) MCG/ACT inhaler Commonly known as: VENTOLIN HFA Inhale 2 puffs into the lungs every 6 (six) hours as needed for wheezing or shortness of breath.   amiodarone 400 MG tablet Commonly known as: PACERONE Take 1 tablet (400 mg total) by mouth 2 (two) times daily.   apixaban 5 MG Tabs tablet Commonly known as: Eliquis Take 2 tablets (10 mg total) by mouth 2 (two) times daily for 7 days, THEN 1 tablet (5 mg total) 2 (two) times daily for 23 days. Start taking on: April 29, 2021 What changed: See the new instructions.   Arnuity Ellipta 200 MCG/ACT Aepb Generic drug: Fluticasone Furoate Inhale 1 puff into the lungs  daily.   cephALEXin 500 MG capsule Commonly known as: KEFLEX Take 1 capsule (500 mg total) by mouth 3 (three) times daily for 6 days. Start taking on: April 30, 2021   clopidogrel 75 MG tablet Commonly known as: PLAVIX Take 1 tablet (75 mg total) by mouth daily with breakfast.   Entresto 24-26 MG Generic drug: sacubitril-valsartan TAKE 1 TABLET BY MOUTH TWICE A DAY   Jardiance 25 MG Tabs tablet Generic drug: empagliflozin TAKE 1 TABLET BY MOUTH EVERY DAY What changed: how much to take   Levemir FlexTouch 100 UNIT/ML FlexPen Generic drug: insulin detemir INJECT 46 UNITS INTO THE SKIN DAILY. DX:E11.9 What changed: See the new instructions.   metoprolol tartrate 50 MG tablet Commonly known as: LOPRESSOR Take 1 tablet (50 mg total) by mouth 2 (two) times daily.   mexiletine 150 MG capsule Commonly known as: MEXITIL Take 2 capsules (300 mg total) by mouth every 12 (twelve) hours. What changed: Another medication with the same name was removed. Continue taking this medication, and follow the directions you see here.   montelukast 10 MG tablet Commonly known as: SINGULAIR TAKE 1 TABLET BY MOUTH EVERY DAY   nitroGLYCERIN 0.4 MG SL tablet Commonly known as: NITROSTAT PLACE 1 TABLET (0.4 MG TOTAL) UNDER THE TONGUE EVERY 5 (FIVE) MINUTES AS NEEDED FOR CHEST PAIN.   NovoFine 32G X 6 MM Misc Generic drug: Insulin Pen Needle 1 each by Other route daily.   NovoLOG FlexPen 100 UNIT/ML FlexPen Generic drug: insulin aspart INJECT 5-20 UNITS SUBCUTANEOUSLY WITH LUNCH AND DINNER What changed:  how much to take how to take this when to take this reasons to take this additional instructions   OneTouch Ultra test strip Generic drug: glucose blood USE TO CHECK BLOOD SUGAR 3 TIMES A DAY (E11.9) What changed:  how much to take how to take this when to take this   rosuvastatin 40 MG tablet Commonly known as: CRESTOR Take 1 tablet (40 mg total) by mouth daily. What changed:   medication strength how much to take Another medication with the same name was removed. Continue taking this medication, and follow the directions you see here.   spironolactone 25 MG tablet Commonly known as: ALDACTONE Take 0.5 tablets (12.5 mg total) by mouth daily. Start taking on: April 30, 2021        Follow-up Information     Icard, Octavio Graves, DO. Go to.   Specialty: Pulmonary Disease Contact information: [address] [place] [address] [phone]         Baldwin Jamaica, PA-C Follow up.   Specialty: Cardiology Why: 05/30/21 @ 9:15AM Contact information: [address] [place] [address] [phone]  [facility]. Schedule an appointment as soon as possible for a visit.   Why: @ this location for outpatient PT. The office will call you with a visit time. Contact information: [address] [place],  [place]  [address]               Allergies  Allergen Reactions   Omega-3 Fatty Acids Hives and Itching   Benazepril Other (See Comments)    hyperkalemia   Fish Allergy Itching    Consultations: EP Pulmonary  Procedures/Studies: NM PET Image Restage (PS) Skull Base to Thigh (F-18 FDG)  Result Date: 04/15/2021 CLINICAL DATA:  Subsequent treatment strategy for lung nodule. EXAM: NUCLEAR MEDICINE PET SKULL BASE TO THIGH TECHNIQUE: 10.7 mCi F-18 FDG was injected intravenously. Full-ring PET imaging was performed from the skull base to thigh after the radiotracer. CT data was obtained and used for attenuation correction and anatomic localization. Fasting blood glucose: 95 mg/dl COMPARISON:  CT chest 04/14/2021, 02/16/2021, 05/07/2019 and PET 03/12/2019. FINDINGS: Mediastinal blood pool activity: SUV max 2.3 Liver activity: SUV max NA NECK: No abnormal hypermetabolism. Incidental CT findings: None. CHEST: No hypermetabolic mediastinal, hilar or axillary lymph  nodes. Left upper lobe nodule has enlarged and is now hypermetabolic, measuring 2.0 x 2.3 cm (9/28) with an SUV max of 7.9, compared to 8 x 11 mm and hypometabolic on 48/18/5631. Other scattered tiny pulmonary nodules are below the size threshold for PET resolution. A 6 mm nodule along the minor fissure (9/35) has been chronically stable and is considered benign. Incidental CT findings: Atherosclerotic calcification of the aorta and coronary arteries. Heart is enlarged. No pericardial effusion. Trace right pleural fluid. ABDOMEN/PELVIS: No abnormal hypermetabolism in the liver, adrenal glands, spleen or pancreas. No hypermetabolic lymph nodes. Incidental CT findings: Liver, gallbladder and adrenal glands are unremarkable. 3.2 cm exophytic low-attenuation lesion off the upper pole right kidney is likely a cyst. Kidneys, spleen, pancreas, stomach and bowel are otherwise unremarkable. Atherosclerotic calcification of the aorta. Prostate is slightly enlarged. SKELETON: Uptake within the lower thoracic spinal cord appears similar and is nonspecific. Otherwise, no abnormal hypermetabolism. Incidental CT findings: Degenerative changes in the spine. IMPRESSION: 1. Enlarging hypermetabolic left upper lobe nodule, most compatible with stage I A primary bronchogenic carcinoma. 2. Trace right pleural fluid. 3. Prostate is enlarged. 4. Aortic atherosclerosis (ICD10-I70.0). Coronary artery calcification. Electronically Signed   By: Lorin Picket M.D.   On: 04/15/2021 15:00   DG CHEST PORT 1 VIEW  Result Date: 04/26/2021 CLINICAL DATA:  Status post bronchoscopy and biopsy EXAM: PORTABLE CHEST 1 VIEW COMPARISON:  04/25/2021 FINDINGS: Left chest cardiac device with leads in the right atrium and ventricle. Unchanged cardiac and mediastinal contours. A surgical clip now projects over the left upper lobe, with any residual nodule possibly obscured by the cardiac device. No focal pulmonary opacity. No pleural effusion or  pneumothorax. No acute osseous abnormality. IMPRESSION: No pneumothorax seen, status post bronchoscopy and biopsy. Electronically Signed   By: Merilyn Baba M.D.   On: 04/26/2021 16:03   DG Chest Port 1 View  Result Date: 04/25/2021 CLINICAL DATA:  70 year old male with enlarging left upper lobe pulmonary nodule EXAM: PORTABLE CHEST 1 VIEW COMPARISON:  Prior chest x-ray 04/17/2021 FINDINGS: Left subclavian approach cardiac rhythm maintenance device. Leads project over the right atrium and right ventricle. Cardiac and mediastinal contours are within normal limits. Subtle nodular opacity projects over the left mid lung partially obscured by the generator of the  cardiac rhythm maintenance device. The nodule measures approximately 2.5 cm. No pneumothorax or pleural effusion. No acute osseous abnormality. IMPRESSION: 1. No active disease. 2. Known left upper lobe pulmonary nodule partially obscured by the overlying generator from the cardiac rhythm maintenance device. The nodule measures approximately 2.5 cm. Electronically Signed   By: Jacqulynn Cadet M.D.   On: 04/25/2021 08:07   DG Chest Port 1 View  Result Date: 04/17/2021 CLINICAL DATA:  Chest pain. EXAM: PORTABLE CHEST 1 VIEW COMPARISON:  February 14, 2021. FINDINGS: Stable cardiomegaly. Left-sided defibrillator is unchanged in position. Mildly increased bibasilar interstitial densities are noted concerning for possible edema or atelectasis. Bony thorax is unremarkable. IMPRESSION: Mildly increased bibasilar interstitial densities are noted concerning for possible edema or atelectasis. Electronically Signed   By: Marijo Conception M.D.   On: 04/17/2021 13:51   CUP PACEART REMOTE DEVICE CHECK  Result Date: 04/13/2021 Scheduled remote reviewed. Normal device function.  Over the last 13 days there have been 59 VT, 39 monitored VT, 3,384 NSVT 57 of 59 events pace terminated. Pt. was seen in the office 10/13 and is mildly aware of the VT Hx of VT ablation  8/4, Sotalol, mexiletine prescribed 82 SVT events as well Next remote 91 days. Route to triage LR  VAS [place] UPPER EXTREMITY VENOUS DUPLEX  Result Date: 04/29/2021 UPPER VENOUS STUDY  Patient Name:  John [name]  Date of Exam:   04/29/2021 Medical Rec #: [id number]         Accession #:    [id number] Date of Birth: 12/21/1950         Patient Gender: M Patient Age:   59 years Exam Location[facility] Procedure:      VAS [place] UPPER EXTREMITY VENOUS DUPLEX Referring Phys: Vance Gather --------------------------------------------------------------------------------  Indications: Pain, Swelling, and Erythema Comparison Study: No prior studies. Performing Technologist: Darlin Coco RDMS, RVT  Examination Guidelines: A complete evaluation includes B-mode imaging, spectral Doppler, color Doppler, and power Doppler as needed of all accessible portions of each vessel. Bilateral testing is considered an integral part of a complete examination. Limited examinations for reoccurring indications may be performed as noted.  Right Findings: +----------+------------+---------+-----------+----------+-------+ RIGHT     CompressiblePhasicitySpontaneousPropertiesSummary +----------+------------+---------+-----------+----------+-------+ Subclavian    Full       Yes       Yes                      +----------+------------+---------+-----------+----------+-------+  Left Findings: +----------+------------+---------+-----------+----------+-------+ LEFT      CompressiblePhasicitySpontaneousPropertiesSummary +----------+------------+---------+-----------+----------+-------+ IJV           Full       Yes       Yes                      +----------+------------+---------+-----------+----------+-------+ Subclavian    Full       Yes       Yes                      +----------+------------+---------+-----------+----------+-------+ Axillary      None       No        No                Acute   +----------+------------+---------+-----------+----------+-------+ Brachial      Full                                          +----------+------------+---------+-----------+----------+-------+  Radial        Full                                          +----------+------------+---------+-----------+----------+-------+ Ulnar         Full                                          +----------+------------+---------+-----------+----------+-------+ Cephalic    Partial      Yes       Yes               Acute  +----------+------------+---------+-----------+----------+-------+ Basilic       None       No        No                Acute  +----------+------------+---------+-----------+----------+-------+  Summary:  Right: No evidence of thrombosis in the subclavian.  Left: Findings consistent with acute deep vein thrombosis involving the left axillary vein. Findings consistent with acute superficial vein thrombosis involving the left basilic vein and left cephalic vein.  *See table(s) above for measurements and observations.    Preliminary    ECHOCARDIOGRAM LIMITED  Result Date: 04/18/2021    ECHOCARDIOGRAM LIMITED REPORT   Patient Name:   John [name] Date of Exam: 04/18/2021 Medical Rec #:  [id number]        Height:       72.0 in Accession #:    [id number]       Weight:       209.2 lb Date of Birth:  09/28/1950        BSA:          2.172 m Patient Age:    40 years         BP:           144/96 mmHg Patient Gender: M                HR:           70 bpm. Exam Location:  Inpatient Procedure: 2D Echo, 3D Echo, Cardiac Doppler and Color Doppler Indications:    I47.2 Ventricular tachycardia  History:        Patient has prior history of Echocardiogram examinations, most                 recent 02/15/2021. CAD, Abnormal ECG, Stroke,                 Signs/Symptoms:Chest Pain; Risk Factors:Current Smoker and                 Diabetes.  Sonographer:    Gorman Referring Phys: [id number] [place]  1. Left ventricular ejection fraction, by estimation, is 25 to 30%. Left ventricular ejection fraction by 3D volume is 27 %. The left ventricle has severely decreased function. The left ventricle demonstrates regional wall motion abnormalities (see scoring diagram/findings for description). The left ventricular internal cavity size was moderately dilated. Left ventricular diastolic parameters are indeterminate. Elevated left ventricular end-diastolic pressure. There is severe hypokinesis of the left ventricular, basal-mid inferoseptal wall and lateral wall. There is akinesis of the left ventricular, basal-mid inferior wall and inferolateral wall.  2. Right ventricular systolic function is normal. The right ventricular  size is mildly enlarged. There is normal pulmonary artery systolic pressure. The estimated right ventricular systolic pressure is 10.2 mmHg.  3. The mitral valve is normal in structure. Mild mitral valve regurgitation. No evidence of mitral stenosis.  4. The aortic valve is normal in structure. Aortic valve regurgitation is not visualized. No aortic stenosis is present.  5. Aortic dilatation noted. There is mild dilatation of the aortic root, measuring 38 mm. There is mild dilatation of the ascending aorta, measuring 43 mm.  6. The inferior vena cava is dilated in size with <50% respiratory variability, suggesting right atrial pressure of 15 mmHg.  7. Compared to study dated 02/15/2021, LVF has declined FINDINGS  Left Ventricle: Left ventricular ejection fraction, by estimation, is 25 to 30%. Left ventricular ejection fraction by 3D volume is 27 %. The left ventricle has severely decreased function. The left ventricle demonstrates regional wall motion abnormalities. Severe hypokinesis of the left ventricular, basal-mid inferoseptal wall and lateral wall. The left ventricular internal cavity size was moderately dilated. There is no left ventricular hypertrophy. Left ventricular  diastolic parameters are  indeterminate. Elevated left ventricular end-diastolic pressure. Right Ventricle: The right ventricular size is mildly enlarged. No increase in right ventricular wall thickness. Right ventricular systolic function is normal. There is normal pulmonary artery systolic pressure. The tricuspid regurgitant velocity is 2.17  m/s, and with an assumed right atrial pressure of 15 mmHg, the estimated right ventricular systolic pressure is 72.5 mmHg. Left Atrium: Left atrial size was normal in size. Right Atrium: Right atrial size was normal in size. Pericardium: There is no evidence of pericardial effusion. Mitral Valve: The mitral valve is normal in structure. Mild mitral valve regurgitation. No evidence of mitral valve stenosis. Tricuspid Valve: The tricuspid valve is normal in structure. Tricuspid valve regurgitation is trivial. No evidence of tricuspid stenosis. Aortic Valve: The aortic valve is normal in structure. Aortic valve regurgitation is not visualized. No aortic stenosis is present. Pulmonic Valve: The pulmonic valve was normal in structure. Pulmonic valve regurgitation is trivial. No evidence of pulmonic stenosis. Aorta: Aortic dilatation noted. There is mild dilatation of the aortic root, measuring 38 mm. There is mild dilatation of the ascending aorta, measuring 43 mm. Venous: The inferior vena cava is dilated in size with less than 50% respiratory variability, suggesting right atrial pressure of 15 mmHg. IAS/Shunts: No atrial level shunt detected by color flow Doppler. LEFT VENTRICLE PLAX 2D LVIDd:         6.30 cm         Diastology LVIDs:         6.10 cm         LV e' medial:    4.58 cm/s LV PW:         1.60 cm         LV E/e' medial:  18.2 LV IVS:        1.10 cm         LV e' lateral:   5.10 cm/s LVOT diam:     2.30 cm         LV E/e' lateral: 16.3 LV SV:         67 LV SV Index:   31 LVOT Area:     4.15 cm        3D Volume EF  LV 3D EF:    Left                                              ventricul LV Volumes (MOD)                            ar LV vol d, MOD    188.0 ml                   ejection A2C:                                        fraction LV vol d, MOD    188.0 ml                   by 3D A4C:                                        volume is LV vol s, MOD    132.0 ml                   27 %. A2C: LV vol s, MOD    160.0 ml A4C:                           3D Volume EF: LV SV MOD A2C:   56.0 ml       3D EF:        27 % LV SV MOD A4C:   188.0 ml      LV EDV:       216 ml LV SV MOD BP:    38.0 ml       LV ESV:       157 ml                                LV SV:        59 ml RIGHT VENTRICLE            IVC RV S prime:     9.30 cm/s  IVC diam: 2.20 cm TAPSE (M-mode): 1.8 cm LEFT ATRIUM             Index        RIGHT ATRIUM           Index LA diam:        4.20 cm 1.93 cm/m   RA Area:     18.40 cm LA Vol (A2C):   72.7 ml 33.48 ml/m  RA Volume:   48.70 ml  22.42 ml/m LA Vol (A4C):   52.4 ml 24.13 ml/m LA Biplane Vol: 64.9 ml 29.88 ml/m  AORTIC VALVE             PULMONIC VALVE LVOT Vmax:   76.40 cm/s  PR End Diast Vel: 1.80 msec LVOT Vmean:  50.500 cm/s LVOT VTI:    0.161 m  AORTA Ao Root diam: 3.80 cm Ao Asc diam:  4.30 cm MITRAL VALVE  TRICUSPID VALVE MV Area (PHT): 4.06 cm       TR Peak grad:   18.8 mmHg MV Decel Time: 187 msec       TR Vmax:        217.00 cm/s MR Peak grad:    79.6 mmHg MR Mean grad:    51.0 mmHg    SHUNTS MR Vmax:         446.00 cm/s  Systemic VTI:  0.16 m MR Vmean:        336.0 cm/s   Systemic Diam: 2.30 cm MR PISA:         1.01 cm MR PISA Eff ROA: 8 mm MR PISA Radius:  0.40 cm MV E velocity: 83.30 cm/s MV A velocity: 69.80 cm/s MV E/A ratio:  1.19 Fransico Him MD Electronically signed by Fransico Him MD Signature Date/Time: 04/18/2021/11:04:20 AM    Final    CT Super D Chest Wo Contrast  Result Date: 04/15/2021 CLINICAL DATA:  Pulmonary nodule. EXAM: CT CHEST WITHOUT CONTRAST TECHNIQUE: Multidetector CT imaging of  the chest was performed using thin slice collimation for electromagnetic bronchoscopy planning purposes, without intravenous contrast. COMPARISON:  02/16/2021 07/26/2020, 02/28/2020, 02/10/2020, 07/29/2019 and 05/07/2019. FINDINGS: Cardiovascular: Atherosclerotic calcification of the aorta, aortic valve and coronary arteries. Heart is enlarged. No pericardial effusion. Mediastinum/Nodes: No pathologically enlarged mediastinal or axillary lymph nodes. Hilar regions are difficult to definitively evaluate without IV contrast. Esophagus is grossly unremarkable. Lungs/Pleura: 6 mm nodule along the minor fissure (7/84), unchanged from 05/07/2019 and considered benign. Spiculated nodule in the posterior left upper lobe measures 2.0 x 2.1 cm (7/62), stable from 02/16/2021 but significantly increased from remote prior exam of 05/07/2019, at which time it measured 10 x 11 mm. No pleural fluid. Airway is unremarkable. Upper Abdomen: Visualized portions of the liver, gallbladder, adrenal glands unremarkable. 2.8 cm low-density exophytic lesion off the upper pole right kidney, incompletely visualized. Visualized portions of the left kidney, spleen, pancreas, stomach and bowel are grossly unremarkable. Musculoskeletal: Degenerative changes in the spine. Flowing anterior osteophytosis in the thoracic spine. No worrisome lytic or sclerotic lesions. IMPRESSION: 1. Spiculated left upper lobe nodule, increased in size from 05/07/2019 and most indicative of primary bronchogenic carcinoma. PET performed today and dictated separately. 2. Aortic atherosclerosis (ICD10-I70.0). Coronary artery calcification. Electronically Signed   By: Lorin Picket M.D.   On: 04/15/2021 14:48   DG C-ARM BRONCHOSCOPY  Result Date: 04/26/2021 C-ARM BRONCHOSCOPY: Fluoroscopy was utilized by the requesting physician.  No radiographic interpretation.    Subjective: No chest pain, dyspnea, shocks. Rhythm stable over past 24 hours. No fever, chills. Does  have swelling and tender redness around the site of previous IV that was said to have infiltrated several days ago. This has not improved. No pain or issues proximal to this area, though swelling extends near the hand.  Discharge Exam: Vitals:   04/29/21 0927 04/29/21 0933  BP: 129/84   Pulse:    Resp: 16   Temp: (!) 97.3 F (36.3 C)   SpO2:  93%   General: Pt is alert, awake, not in acute distress Cardiovascular: RRR, paced, S1/S2 +, no rubs, no gallops Respiratory: CTA bilaterally, no wheezing, no rhonchi Abdominal: Soft, NT, ND, bowel sounds + Extremities: Left volar forearm with small eschar with surrounding tender edematous erythema without induration or fluctuance. Diffuse borders that are not well-defined extending several inches proximally. Full painless ROM. No discharge.   Labs: BNP (last 3 results) Recent Labs    01/17/21 2233  BNP 750.5*   Basic Metabolic Panel: Recent Labs  Lab 04/25/21 0436 04/26/21 0218 04/27/21 0430 04/28/21 0241 04/29/21 0313  NA 140 140 137 137 138  K 4.0 4.0 4.4 4.3 5.1  CL 110 108 105 107 107  CO2 24 26 23 24 24   GLUCOSE 106* 116* 175* 112* 104*  BUN 17 18 18  25* 22  CREATININE 1.07 1.39* 1.31* 1.32* 1.17  CALCIUM 8.5* 8.5* 9.2 8.9 8.5*  MG 2.2 2.1 2.2 2.3 2.4   CBC: Recent Labs  Lab 04/25/21 0436 04/27/21 0430 04/29/21 0313  WBC 6.7 13.0* 8.7  HGB 16.7 18.0* 16.5  HCT 49.9 55.9* 51.5  MCV 93.1 93.6 93.5  PLT 182 215 214   CBG: Recent Labs  Lab 04/28/21 0737 04/28/21 1131 04/28/21 1600 04/28/21 2051 04/29/21 0741  GLUCAP 143* 139* 156* 162* 121*   Time coordinating discharge: Approximately 40 minutes  Patrecia Pour, MD  [facility] 04/29/2021, 10:46 AM

## 2021-04-29 NOTE — Progress Notes (Signed)
Pt is complaining of pain in left arm. Arm is swollen, red, painful and warm to touch. Pt states he mentioned to doctor on 11/10. He was advised that we would do an ultrasound but was never done. Notified MD

## 2021-04-29 NOTE — Progress Notes (Signed)
Pt stated left arm is hurting. Assessed forearm. It is red and angry. Tender to touch. Pt stated provider yesterday said he will order ultrasound. My charge nurse assessed site. Will pass along to day nurse. Notified provider.

## 2021-04-29 NOTE — Progress Notes (Signed)
Upper extremity venous LT study completed.  Preliminary results relayed to Bonner Puna, MD and Freddi Starr, RN via secure chat.  See CV Proc for preliminary results report.   Darlin Coco, RDMS, RVT

## 2021-04-29 NOTE — Care Management (Signed)
04-29-21 1006 Case Manager submitted an ambulatory referral to the outpatient [facility]. [facility] facilitator will call the patient to schedule an appointment. No further needs identified at this time.

## 2021-05-02 ENCOUNTER — Telehealth: Payer: Self-pay | Admitting: *Deleted

## 2021-05-02 ENCOUNTER — Other Ambulatory Visit: Payer: Self-pay | Admitting: *Deleted

## 2021-05-02 DIAGNOSIS — Z79899 Other long term (current) drug therapy: Secondary | ICD-10-CM

## 2021-05-02 LAB — CYTOLOGY - NON PAP

## 2021-05-02 NOTE — Telephone Encounter (Signed)
Received call from [place], [facility] with [place] [phone]~ telephone.   Reports that patient has been discharged home from hospital with orders for palliative care, DX: CAD, CHF.   Inquired if PCP is agreeable to services. VO given.

## 2021-05-03 ENCOUNTER — Telehealth: Payer: Self-pay

## 2021-05-03 NOTE — Telephone Encounter (Signed)
I called John [name], John [name]'s wife, to schedule an appt for John [name] to see our team. He was recently discharged from the hospital and will be seeing John [name] office to go over bronch results on 11/17. He was referred to our clinic during his hospital stay and we have scheduled him for 11/18, after going over results. Per John Baton, NP, we will schedule while awaiting for possible oncology involvement. I also went over both his appts on 11/17 with John [name]. She verbalized understanding of appts and times on 11/17 and 11/18. I provided her with my office number if she had any questions. All questions answered. Understanding verbalized.

## 2021-05-04 ENCOUNTER — Encounter: Payer: Self-pay | Admitting: Student

## 2021-05-04 ENCOUNTER — Telehealth: Payer: Self-pay | Admitting: *Deleted

## 2021-05-04 NOTE — Chronic Care Management (AMB) (Signed)
  Chronic Care Management   Note  05/04/2021 Name: John [name] MRN: [id number] DOB: 06/23/1950  John [name] is a 70 y.o. year old male who is a primary care patient of [name], MD. I reached out to [name] by phone today in response to a referral sent by [name]'s PCP.  [name] was given information about Chronic Care Management services today including:  CCM service includes personalized support from designated clinical staff supervised by his physician, including individualized plan of care and coordination with other care providers 24/7 contact phone numbers for assistance for urgent and routine care needs. Service will only be billed when office clinical staff spend 20 minutes or more in a month to coordinate care. Only one practitioner may furnish and bill the service in a calendar month. The patient may stop CCM services at any time (effective at the end of the month) by phone call to the office staff. The patient is responsible for co-pay (up to 20% after annual deductible is met) if co-pay is required by the individual health plan.   Patient agreed to services and verbal consent obtained.   Follow up plan: Telephone appointment with care management team member scheduled for:05/09/21  [place] [facility]  Direct Dial: [phone]

## 2021-05-05 ENCOUNTER — Ambulatory Visit (INDEPENDENT_AMBULATORY_CARE_PROVIDER_SITE_OTHER): Payer: Medicare Other | Admitting: Nurse Practitioner

## 2021-05-05 ENCOUNTER — Ambulatory Visit: Payer: Medicare Other | Admitting: Primary Care

## 2021-05-05 ENCOUNTER — Other Ambulatory Visit: Payer: Self-pay | Admitting: Family Medicine

## 2021-05-05 ENCOUNTER — Other Ambulatory Visit: Payer: Self-pay

## 2021-05-05 ENCOUNTER — Ambulatory Visit (INDEPENDENT_AMBULATORY_CARE_PROVIDER_SITE_OTHER): Payer: Medicare Other | Admitting: Student

## 2021-05-05 ENCOUNTER — Encounter: Payer: Self-pay | Admitting: Student

## 2021-05-05 ENCOUNTER — Encounter: Payer: Self-pay | Admitting: Nurse Practitioner

## 2021-05-05 VITALS — BP 120/90 | HR 81 | Resp 20 | Ht 72.0 in | Wt 213.5 lb

## 2021-05-05 VITALS — BP 130/90 | HR 83 | Temp 97.3°F | Ht 72.0 in | Wt 211.2 lb

## 2021-05-05 DIAGNOSIS — I2510 Atherosclerotic heart disease of native coronary artery without angina pectoris: Secondary | ICD-10-CM | POA: Diagnosis not present

## 2021-05-05 DIAGNOSIS — J849 Interstitial pulmonary disease, unspecified: Secondary | ICD-10-CM | POA: Diagnosis not present

## 2021-05-05 DIAGNOSIS — I255 Ischemic cardiomyopathy: Secondary | ICD-10-CM | POA: Diagnosis not present

## 2021-05-05 DIAGNOSIS — Z95810 Presence of automatic (implantable) cardiac defibrillator: Secondary | ICD-10-CM

## 2021-05-05 DIAGNOSIS — I10 Essential (primary) hypertension: Secondary | ICD-10-CM | POA: Diagnosis not present

## 2021-05-05 DIAGNOSIS — E118 Type 2 diabetes mellitus with unspecified complications: Secondary | ICD-10-CM

## 2021-05-05 DIAGNOSIS — E785 Hyperlipidemia, unspecified: Secondary | ICD-10-CM | POA: Diagnosis not present

## 2021-05-05 DIAGNOSIS — I472 Ventricular tachycardia, unspecified: Secondary | ICD-10-CM

## 2021-05-05 DIAGNOSIS — I82622 Acute embolism and thrombosis of deep veins of left upper extremity: Secondary | ICD-10-CM

## 2021-05-05 DIAGNOSIS — I82A12 Acute embolism and thrombosis of left axillary vein: Secondary | ICD-10-CM | POA: Diagnosis not present

## 2021-05-05 DIAGNOSIS — R911 Solitary pulmonary nodule: Secondary | ICD-10-CM

## 2021-05-05 DIAGNOSIS — Z79899 Other long term (current) drug therapy: Secondary | ICD-10-CM | POA: Diagnosis not present

## 2021-05-05 DIAGNOSIS — G4733 Obstructive sleep apnea (adult) (pediatric): Secondary | ICD-10-CM

## 2021-05-05 DIAGNOSIS — Z794 Long term (current) use of insulin: Secondary | ICD-10-CM

## 2021-05-05 DIAGNOSIS — I5042 Chronic combined systolic (congestive) and diastolic (congestive) heart failure: Secondary | ICD-10-CM | POA: Diagnosis not present

## 2021-05-05 DIAGNOSIS — I4892 Unspecified atrial flutter: Secondary | ICD-10-CM

## 2021-05-05 NOTE — Progress Notes (Signed)
Subjective:    Patient ID: John [name], male    DOB: 09-09-1950, 70 y.o.   MRN: [id number]  HPI: John [name] is a 70 y.o. male presenting for  Chief Complaint  Patient presents with   Follow-up    Blood clots in left arm recently in hospital for 13 days    LUE DVT In hospital, had DVT of left axillary vein and SVTs of cephalic and basilic vein.  Pt was discharged 11/11.  Reports since discharge, he has been taking Eliquis 5 mg twice daily.  He is in our office today because with the weekend coming up, he wants to make sure he is on the correct therapy.  He reports he has been elevating his arm at home above the level of his heart.  His arm pain is improving and the swelling is improving.  The redness is also improving.  He denies any bleeding today.  Allergies  Allergen Reactions   Omega-3 Fatty Acids Hives and Itching   Benazepril Other (See Comments)    hyperkalemia   Fish Allergy Itching    Outpatient Encounter Medications as of 05/05/2021  Medication Sig   albuterol (VENTOLIN HFA) 108 (90 Base) MCG/ACT inhaler Inhale 2 puffs into the lungs every 6 (six) hours as needed for wheezing or shortness of breath.   amiodarone (PACERONE) 400 MG tablet Take 1 tablet (400 mg total) by mouth 2 (two) times daily.   apixaban (ELIQUIS) 5 MG TABS tablet Take 2 tablets (10 mg total) by mouth 2 (two) times daily for 7 days, THEN 1 tablet (5 mg total) 2 (two) times daily for 23 days.   cephALEXin (KEFLEX) 500 MG capsule Take 1 capsule (500 mg total) by mouth 3 (three) times daily for 6 days.   clopidogrel (PLAVIX) 75 MG tablet Take 1 tablet (75 mg total) by mouth daily with breakfast.   ENTRESTO 24-26 MG TAKE 1 TABLET BY MOUTH TWICE A DAY   Fluticasone Furoate (ARNUITY ELLIPTA) 200 MCG/ACT AEPB Inhale 1 puff into the lungs daily.   glucose blood (ONETOUCH ULTRA) test strip USE TO CHECK BLOOD SUGAR 3 TIMES A DAY (E11.9) (Patient taking differently: 1 each by Other route 3 (three)  times daily. USE TO CHECK BLOOD SUGAR 3 TIMES A DAY (E11.9))   insulin aspart (NOVOLOG FLEXPEN) 100 UNIT/ML FlexPen INJECT 5-20 UNITS SUBCUTANEOUSLY WITH LUNCH AND DINNER (Patient taking differently: Inject 6-8 Units into the skin daily as needed for high blood sugar.)   Insulin Pen Needle (NOVOFINE) 32G X 6 MM MISC 1 each by Other route daily.   LEVEMIR FLEXTOUCH 100 UNIT/ML FlexPen INJECT 46 UNITS INTO THE SKIN DAILY. DX:E11.9 (Patient taking differently: Inject 46 Units into the skin daily.)   metoprolol tartrate (LOPRESSOR) 50 MG tablet Take 1 tablet (50 mg total) by mouth 2 (two) times daily.   mexiletine (MEXITIL) 150 MG capsule Take 2 capsules (300 mg total) by mouth every 12 (twelve) hours.   montelukast (SINGULAIR) 10 MG tablet TAKE 1 TABLET BY MOUTH EVERY DAY (Patient taking differently: Take 10 mg by mouth daily.)   nitroGLYCERIN (NITROSTAT) 0.4 MG SL tablet PLACE 1 TABLET (0.4 MG TOTAL) UNDER THE TONGUE EVERY 5 (FIVE) MINUTES AS NEEDED FOR CHEST PAIN.   rosuvastatin (CRESTOR) 40 MG tablet Take 1 tablet (40 mg total) by mouth daily.   spironolactone (ALDACTONE) 25 MG tablet Take 0.5 tablets (12.5 mg total) by mouth daily.   [DISCONTINUED] JARDIANCE 25 MG TABS tablet TAKE 1 TABLET  BY MOUTH EVERY DAY (Patient taking differently: Take 25 mg by mouth daily.)   No facility-administered encounter medications on file as of 05/05/2021.    Patient Active Problem List   Diagnosis Date Noted   DVT of left axillary vein, acute ([place]) 64/40/3474   Embolic stroke ([place]) 25/95/6387   History of non-ST elevation myocardial infarction (NSTEMI) 56/43/3295   Chronic systolic CHF (congestive heart failure) ([place]) 02/08/2021   VT (ventricular tachycardia) 01/26/2021   Coagulation defect ([place]) 12/22/2020   Non-ST elevation (NSTEMI) myocardial infarction ([place])    Capsulitis 05/21/2020   Thoracic aortic aneurysm 02/29/2020   Obesity (BMI 30.0-34.9) 02/29/2020   Lung nodule 02/19/2020   Former smoker  02/19/2020   Acoustic neuroma ([place]) 09/11/2017   Asymmetrical sensorineural hearing loss 09/11/2017   Anticoagulation adequate 06/06/2016   Hyperlipidemia LDL goal <70 05/20/2015   OSA on CPAP 07/23/2013   Hypertension    Hyperlipidemia    Tobacco abuse 08/18/2012   CAD, multiple prior RCA PCI's. Last cath 2010, Myoview low risk Oct 2012 02/26/2012   DM type 2, goal HbA1c < 7% (HCC) 02/26/2012   HTN (hypertension) 02/26/2012   COPD (chronic obstructive pulmonary disease) ([place]) 02/26/2012   Smoking, quit one week ago 02/26/2012   GERD (gastroesophageal reflux disease) 02/26/2012    Past Medical History:  Diagnosis Date   Atrial flutter ([place])    s/p cardioversion   Coronary artery disease    Diabetes mellitus    GERD (gastroesophageal reflux disease)    History of nuclear stress test 04/04/2011   lexiscan; mod-large in size fixed inferolateral defect (scar); non-diagnostic for ischemia; low risk scan    Hyperlipidemia    Hypertension    Ischemic cardiomyopathy    Left foot drop    r/t past disk srugery - uses Kevlar brace   Myocardial infarction [facility])    posterior MI   Osteomyelitis ([place])    s/p left 2nd toe amputation in 01/2021   Pulmonary nodule    Recurrent ventricular tachycardia    Shortness of breath    Sleep apnea    on CPAP; 04/28/2007 split-night - AHI during total sleep 44.43/hr and REM 72.56/hr    Relevant past medical, surgical, family and social history reviewed and updated as indicated. Interim medical history since our last visit reviewed.  Review of Systems Per HPI unless specifically indicated above     Objective:    BP 130/90   Pulse 83   Temp (!) 97.3 F (36.3 C)   Ht 6' (1.829 m)   Wt 211 lb 3.2 oz (95.8 kg)   SpO2 97%   BMI 28.64 kg/m   Wt Readings from Last 3 Encounters:  05/05/21 211 lb 3.2 oz (95.8 kg)  04/28/21 207 lb 3.7 oz (94 kg)  03/31/21 216 lb 9.6 oz (98.2 kg)    Physical Exam Vitals and nursing note reviewed.   Constitutional:      General: He is not in acute distress.    Appearance: Normal appearance. He is not toxic-appearing.  Cardiovascular:     Rate and Rhythm: Normal rate and regular rhythm.     Heart sounds: Normal heart sounds. No murmur heard. Musculoskeletal:     Right upper arm: Normal.     Left upper arm: Swelling and edema present.       Arms:     Comments: Generalized non pitting edema to left upper extremity.  Small area of redness to posterior forearm as marked above - suspect resolving cellulitis.  No warmth, fluctuance, drainage, or tenderness to palpation.  Skin:    General: Skin is warm and dry.     Findings: Bruising and erythema present.  Neurological:     Mental Status: He is alert.      Assessment & Plan:  1. DVT of left axillary vein, acute (HCC) Acute.  Reviewed hospital discharge instructions with patient - has not been taking increased dose of Eliquis.  Will start Eliquis 10 mg twice daily today x 7 days and then resume Eliquis 5 mg twice daily.  Continue Plavix as well.  Continue elevation of left upper extremity.  Follow up if symptoms worsen or do not continue to improve.  Follow up plan: Return if symptoms worsen or fail to improve.

## 2021-05-05 NOTE — Patient Instructions (Addendum)
Medication Instructions:  Your physician recommends that you continue on your current medications as directed. Please refer to the Current Medication list given to you today.  *If you need a refill on your cardiac medications before your next appointment, please call your pharmacy*  Lab Work: Your physician recommends that you return for lab work TODAY:  BMET CBC Magnesium   If you have labs (blood work) drawn today and your tests are completely normal, you will receive your results only by: [place] (if you have MyChart) OR A paper copy in the mail If you have any lab test that is abnormal or we need to change your treatment, we will call you to review the results.  Testing/Procedures: NONE ordered at this time of appointment   Follow-Up: At [facility], you and your health needs are our priority.  As part of our continuing mission to provide you with exceptional heart care, we have created designated Provider Care Teams.  These Care Teams include your primary Cardiologist (physician) and Advanced Practice Providers (APPs -  Physician Assistants and Nurse Practitioners) who all work together to provide you with the care you need, when you need it.  Your next appointment:   As scheduled   The format for your next appointment:   In Person  Provider:   Shelva Majestic, MD    Other Instructions

## 2021-05-06 ENCOUNTER — Other Ambulatory Visit: Payer: Self-pay | Admitting: Family Medicine

## 2021-05-06 ENCOUNTER — Telehealth: Payer: Self-pay | Admitting: Primary Care

## 2021-05-06 ENCOUNTER — Inpatient Hospital Stay: Payer: Medicare Other | Attending: Nurse Practitioner | Admitting: Nurse Practitioner

## 2021-05-06 VITALS — BP 131/98 | HR 86 | Temp 97.5°F | Resp 17 | Wt 211.6 lb

## 2021-05-06 DIAGNOSIS — I82A12 Acute embolism and thrombosis of left axillary vein: Secondary | ICD-10-CM | POA: Insufficient documentation

## 2021-05-06 DIAGNOSIS — R911 Solitary pulmonary nodule: Secondary | ICD-10-CM | POA: Diagnosis not present

## 2021-05-06 DIAGNOSIS — Z7189 Other specified counseling: Secondary | ICD-10-CM | POA: Diagnosis not present

## 2021-05-06 DIAGNOSIS — Z515 Encounter for palliative care: Secondary | ICD-10-CM | POA: Diagnosis not present

## 2021-05-06 DIAGNOSIS — R531 Weakness: Secondary | ICD-10-CM | POA: Diagnosis not present

## 2021-05-06 DIAGNOSIS — Z7901 Long term (current) use of anticoagulants: Secondary | ICD-10-CM | POA: Diagnosis not present

## 2021-05-06 LAB — CBC
Hematocrit: 52.6 % — ABNORMAL HIGH (ref 37.5–51.0)
Hemoglobin: 17.5 g/dL (ref 13.0–17.7)
MCH: 29.9 pg (ref 26.6–33.0)
MCHC: 33.3 g/dL (ref 31.5–35.7)
MCV: 90 fL (ref 79–97)
Platelets: 254 10*3/uL (ref 150–450)
RBC: 5.86 x10E6/uL — ABNORMAL HIGH (ref 4.14–5.80)
RDW: 12.6 % (ref 11.6–15.4)
WBC: 8.7 10*3/uL (ref 3.4–10.8)

## 2021-05-06 LAB — BASIC METABOLIC PANEL
BUN/Creatinine Ratio: 11 (ref 10–24)
BUN: 13 mg/dL (ref 8–27)
CO2: 22 mmol/L (ref 20–29)
Calcium: 9.2 mg/dL (ref 8.6–10.2)
Chloride: 108 mmol/L — ABNORMAL HIGH (ref 96–106)
Creatinine, Ser: 1.15 mg/dL (ref 0.76–1.27)
Glucose: 103 mg/dL — ABNORMAL HIGH (ref 70–99)
Potassium: 4.6 mmol/L (ref 3.5–5.2)
Sodium: 147 mmol/L — ABNORMAL HIGH (ref 134–144)
eGFR: 68 mL/min/{1.73_m2} (ref >59)

## 2021-05-06 LAB — MAGNESIUM: Magnesium: 2.3 mg/dL (ref 1.6–2.3)

## 2021-05-06 NOTE — Telephone Encounter (Signed)
I have not seen him. I will send to Dr. Valeta Harms to ask if he feels it is ok to change to video visit or if he needs to come in to office

## 2021-05-06 NOTE — Progress Notes (Signed)
  Outpatient Palliative Care  [phone] ________________________________ Nursing Assessment:  Name: John [name]        MRN: [id number]  Date of Service: 05/06/2021 DOB: [birth year] Visit Type: New patient-hospital follow up  Support at Visit: Wife, John [name]  Review of Systems: General: John [name] reports fatigue and having to nap throughout John day.  No weight loss. Reports sleeping as he normally does-wakes up about every 3 hours and will go back to sleep.  Neuro: Reports being dizzy constantly and having blurred vision since his stroke in September.  Cardiac: None Pulmonary: Reports SHOB that "comes and goes". He says that sometimes he can become short of breath moving between rooms at home but had walked into John clinic and never experienced this.   GI: Reports a normal appetite.   GU: None Integumentary: Redness on arms from recent hospitalizations.  Psychological: John [name] and his wife expressed feeling depressed. They stated that it has been hard to not be able to function as they once could and they are trying to get used to this change. John [name] was tearful as she explained that they aren't able to drive anymore and getting to appts is very difficult as they live in [place]. Emotional listening and support provided.   Medication Changes/Additions: Increased Eliquis dose per PCP due to two blood clots in L arm.    Pain Review: Scale of 0-10: 0/No pain Location: Description: Frequency: Relief:   Social Review: Living Situation: lives with wife, John [name] Support: wife, children,   Falls in John last 3 months? Yes, John [name] reports having "quite a few". He said that he sometimes gets up too fast or looses his balance while trying to get up and will fall  Assistive Device Use? Yes, cane and walker   Functional Status: Independent   ACP Form Review: Yes. Provided education on a MOST form and they said they were interested in looking at one and filling it  out.  Family/Patient Concerns: Concerned with miscommunication about appts with Dr. Valeta Harms. This was cancelled yesterday due to going to PCP but John [name] didn't understand that this appt was to go over bronch results, which they need before establishing care here. I called John office and was able to get them a scheduled appt on Monday, so that they can begin next steps here. Concerned with transportation barriers.   Provider notified of assessment and patient/family concerns. Patient instructed to call with any questions or concerns.

## 2021-05-06 NOTE — Telephone Encounter (Signed)
EW please advise if you are ok to change appt on 11/21 to a video visit.  thanks

## 2021-05-06 NOTE — Progress Notes (Signed)
[place]  [phone] [phone]   Name: John [name] Date: 05/06/2021 MRN: [id number]  DOB: 1950-10-04  Patient Care Team: [name], MD as PCP - General (Family Medicine) Troy Sine, MD as PCP - Cardiology (Cardiology) Constance Haw, MD as PCP - Electrophysiology (Cardiology) Kassie Mends, RN as Case Manager    REASON FOR CONSULTATION: John [name] is a 70 y.o. male with multiple medical problems including CAD s/p multiple PCI's (11/9627), systolic CHF (EF 52-84% on 03/2021), atrial flutter (Eliquis), hyperlipidemia, hypertension, OSA (CPAP), ILD, osteomyelitis s/p 2nd left toe amputation, now with recent lung biopsy due pulmonary nodule during recent hospitalization.  Palliative ask to see for ongoing symptom management and goals of care after being seen while hospitalized.    SOCIAL HISTORY:     reports that he quit smoking about 4 years ago. His smoking use included cigarettes. He has a 50.00 pack-year smoking history. He has never used smokeless tobacco. He reports that he does not currently use alcohol. He reports that he does not use drugs.  ADVANCE DIRECTIVES:  None on file   CODE STATUS:   PAST MEDICAL HISTORY: Past Medical History:  Diagnosis Date   Atrial flutter ([place])    s/p cardioversion   Coronary artery disease    Diabetes mellitus    GERD (gastroesophageal reflux disease)    History of nuclear stress test 04/04/2011   lexiscan; mod-large in size fixed inferolateral defect (scar); non-diagnostic for ischemia; low risk scan    Hyperlipidemia    Hypertension    Ischemic cardiomyopathy    Left foot drop    r/t past disk srugery - uses Kevlar brace   Myocardial infarction ([place])    posterior MI   Osteomyelitis ([place])    s/p left 2nd toe amputation in 01/2021   Pulmonary nodule    Recurrent ventricular tachycardia    Shortness of breath    Sleep apnea    on CPAP;  04/28/2007 split-night - AHI during total sleep 44.43/hr and REM 72.56/hr    PAST SURGICAL HISTORY:  Past Surgical History:  Procedure Laterality Date   AMPUTATION TOE Left 02/10/2021   Procedure: AMPUTATION  LEFT SECOND TOE;  Surgeon: Edrick Kins, DPM;  Location: [facility];  Service: Podiatry;  Laterality: Left;   [place]   BRONCHIAL BIOPSY  04/26/2021   Procedure: BRONCHIAL BIOPSIES;  Surgeon: Garner Nash, DO;  Location: [facility];  Service: Pulmonary;;   BRONCHIAL BRUSHINGS  04/26/2021   Procedure: BRONCHIAL BRUSHINGS;  Surgeon: Garner Nash, DO;  Location: [facility];  Service: Pulmonary;;   BRONCHIAL NEEDLE ASPIRATION BIOPSY  04/26/2021   Procedure: BRONCHIAL NEEDLE ASPIRATION BIOPSIES;  Surgeon: Garner Nash, DO;  Location: [facility];  Service: Pulmonary;;   CARDIAC CATHETERIZATION  2010   6 stents total   CARDIAC CATHETERIZATION  01/2000   percutaneous transluminal coronary balloon angioplasty of mid RCA stenotic lesion   CARDIAC CATHETERIZATION  06/2006   no stenting; ischemic cardiomyopathy, EF 40-45%   CARDIOVERSION N/A 07/28/2016   Procedure: CARDIOVERSION;  Surgeon: Troy Sine, MD;  Location: [place];  Service: Cardiovascular;  Laterality: N/A;   CORONARY ANGIOPLASTY  09/1998   mid-distal RCA balloon dilatation, 4.5 & 5.0 stents    CORONARY ANGIOPLASTY WITH STENT PLACEMENT  03/1994   angioplasty & stenting (non-DES) of circumflex/prox ramus intermedius   CORONARY ANGIOPLASTY WITH STENT PLACEMENT  10/1994   large  iliac [id number] stent to RCA   CORONARY ANGIOPLASTY WITH STENT PLACEMENT  12/2002   4.63m stents x2 of RCA   CORONARY ANGIOPLASTY WITH STENT PLACEMENT  01/2005   cutting balloon arthrectomy of distal RCA & Cypher DES 3.5x13; cutting balloon arthrectomy of mid RCA with Cypher DES 3.5x18   CORONARY ANGIOPLASTY WITH STENT PLACEMENT  11/2008   stenting of mid RCA with 4.0x125mdriver, non-DES   CORONARY BALLOON ANGIOPLASTY N/A 02/15/2021    Procedure: CORONARY BALLOON ANGIOPLASTY;  Surgeon: KeTroy SineMD;  [facility];  Service: Cardiovascular;  Laterality: N/A;   FIDUCIAL MARKER PLACEMENT  04/26/2021   Procedure: FIDUCIAL MARKER PLACEMENT;  Surgeon: IcGarner NashDO;  [facility];  Service: Pulmonary;;   ICD IMPLANT N/A 08/20/2020   Procedure: ICD IMPLANT;  Surgeon: CaConstance HawMD;  [facility];  Service: Cardiovascular;  Laterality: N/A;   INTRAVASCULAR PRESSURE WIRE/FFR STUDY N/A 03/02/2020   Procedure: INTRAVASCULAR PRESSURE WIRE/FFR STUDY;  Surgeon: HaLeonie ManMD;  [facility];  Service: Cardiovascular;  Laterality: N/A;   LEFT HEART CATH AND CORONARY ANGIOGRAPHY N/A 03/02/2020   Procedure: LEFT HEART CATH AND CORONARY ANGIOGRAPHY;  Surgeon: HaLeonie ManMD;  [facility];  Service: Cardiovascular;  Laterality: N/A;   LEFT HEART CATH AND CORONARY ANGIOGRAPHY N/A 08/19/2020   Procedure: LEFT HEART CATH AND CORONARY ANGIOGRAPHY;  Surgeon: BeLorretta HarpMD;  [facility];  Service: Cardiovascular;  Laterality: N/A;   LEFT HEART CATH AND CORONARY ANGIOGRAPHY N/A 02/15/2021   Procedure: LEFT HEART CATH AND CORONARY ANGIOGRAPHY;  Surgeon: KeTroy SineMD;  [facility];  Service: Cardiovascular;  Laterality: N/A;   LEFT HEART CATHETERIZATION WITH CORONARY ANGIOGRAM N/A 02/27/2012   Procedure: LEFT HEART CATHETERIZATION WITH CORONARY ANGIOGRAM;  Surgeon: JoLorretta HarpMD;  [facility];  Service: Cardiovascular;  Laterality: N/A;   TRANSTHORACIC ECHOCARDIOGRAM  07/29/2010   EF 50=55%, mod inf wall hypokinesis & mild post wall hypokinesis; LA mild-mod dilated; mild mitral annular calcif & mild MR; mild TR & elevated RV systolic pressure; AV mildly sclerotic; mild aortic root dilatation    V TACH ABLATION N/A 01/20/2021   Procedure: V TACH ABLATION;  Surgeon: LaVickie EpleyMD;   [facility];  Service: Cardiovascular;  Laterality: N/A;   VIDEO BRONCHOSCOPY WITH ENDOBRONCHIAL NAVIGATION Left 04/26/2021   Procedure: VIDEO BRONCHOSCOPY WITH ENDOBRONCHIAL NAVIGATION;  Surgeon: IcGarner NashDO;  [facility] Service: Pulmonary;  Laterality: Left;  ION w/ fiducial   VIDEO BRONCHOSCOPY WITH RADIAL ENDOBRONCHIAL ULTRASOUND  04/26/2021   Procedure: RADIAL ENDOBRONCHIAL ULTRASOUND;  Surgeon: IcGarner NashDO;  [facility];  Service: Pulmonary;;    HEMATOLOGY/ONCOLOGY HISTORY:  Oncology History   No history exists.    ALLERGIES:  is allergic to omega-3 fatty acids, benazepril, and fish allergy.  MEDICATIONS:  Current Outpatient Medications  Medication Sig Dispense Refill   albuterol (VENTOLIN HFA) 108 (90 Base) MCG/ACT inhaler Inhale 2 puffs into the lungs every 6 (six) hours as needed for wheezing or shortness of breath. 1 each 6   amiodarone (PACERONE) 400 MG tablet Take 1 tablet (400 mg total) by mouth 2 (two) times daily. 60 tablet 1   apixaban (ELIQUIS) 5 MG TABS tablet Take 2 tablets (10 mg total) by mouth 2 (two) times daily for 7 days, THEN 1 tablet (5 mg  total) 2 (two) times daily for 23 days. 74 tablet 0   cephALEXin (KEFLEX) 500 MG capsule Take 1 capsule (500 mg total) by mouth 3 (three) times daily for 6 days. 18 capsule 0   clopidogrel (PLAVIX) 75 MG tablet Take 1 tablet (75 mg total) by mouth daily with breakfast.     ENTRESTO 24-26 MG TAKE 1 TABLET BY MOUTH TWICE A DAY 60 tablet 6   Fluticasone Furoate (ARNUITY ELLIPTA) 200 MCG/ACT AEPB Inhale 1 puff into the lungs daily. 30 each 6   insulin aspart (NOVOLOG FLEXPEN) 100 UNIT/ML FlexPen INJECT 5-20 UNITS SUBCUTANEOUSLY WITH LUNCH AND DINNER (Patient taking differently: Inject 6-8 Units into the skin daily as needed for high blood sugar.) 15 mL 1   Insulin Pen Needle (NOVOFINE) 32G X 6 MM MISC 1 each by Other route daily. 100 each 3   JARDIANCE 25 MG TABS tablet TAKE  1 TABLET BY MOUTH EVERY DAY 30 tablet 5   LEVEMIR FLEXTOUCH 100 UNIT/ML FlexTouch Pen INJECT 46 UNITS INTO THE SKIN DAILY. DX:E11.9 15 mL 3   metoprolol tartrate (LOPRESSOR) 50 MG tablet Take 1 tablet (50 mg total) by mouth 2 (two) times daily. 60 tablet 5   mexiletine (MEXITIL) 150 MG capsule Take 2 capsules (300 mg total) by mouth every 12 (twelve) hours. 120 capsule 5   montelukast (SINGULAIR) 10 MG tablet TAKE 1 TABLET BY MOUTH EVERY DAY (Patient taking differently: Take 10 mg by mouth daily.) 90 tablet 3   nitroGLYCERIN (NITROSTAT) 0.4 MG SL tablet PLACE 1 TABLET (0.4 MG TOTAL) UNDER THE TONGUE EVERY 5 (FIVE) MINUTES AS NEEDED FOR CHEST PAIN. 25 tablet 1   ONETOUCH ULTRA test strip USE TO CHECK BLOOD SUGAR 3 TIMES A DAY (E11.9) 100 strip 2   rosuvastatin (CRESTOR) 40 MG tablet Take 1 tablet (40 mg total) by mouth daily. 30 tablet 0   spironolactone (ALDACTONE) 25 MG tablet Take 0.5 tablets (12.5 mg total) by mouth daily. 15 tablet 0   No current facility-administered medications for this visit.    VITAL SIGNS: BP (!) 131/98 (BP Location: Left Arm, Patient Position: Sitting) Comment: Nurse aware of patient BP  Pulse 86   Temp (!) 97.5 F (36.4 C) (Oral)   Resp 17   Wt 211 lb 9.6 oz (96 kg)   SpO2 97%   BMI 28.70 kg/m  Filed Weights   05/06/21 1113  Weight: 211 lb 9.6 oz (96 kg)    Estimated body mass index is 28.7 kg/m as calculated from the following:   Height as of 05/05/21: 6' (1.829 m).   Weight as of this encounter: 211 lb 9.6 oz (96 kg).  LABS: CBC:    Component Value Date/Time   WBC 8.7 05/05/2021 1545   WBC 8.7 04/29/2021 0313   HGB 17.5 05/05/2021 1545   HCT 52.6 (H) 05/05/2021 1545   PLT 254 05/05/2021 1545   MCV 90 05/05/2021 1545   NEUTROABS 1.2 (L) 04/17/2021 1250   LYMPHSABS 1.0 04/17/2021 1250   MONOABS 0.1 04/17/2021 1250   EOSABS 0.0 04/17/2021 1250   BASOSABS 0.0 04/17/2021 1250   Comprehensive Metabolic Panel:    Component Value Date/Time   NA  147 (H) 05/05/2021 1545   K 4.6 05/05/2021 1545   CL 108 (H) 05/05/2021 1545   CO2 22 05/05/2021 1545   BUN 13 05/05/2021 1545   CREATININE 1.15 05/05/2021 1545   CREATININE 0.96 09/15/2019 1538   GLUCOSE 103 (H) 05/05/2021 1545   GLUCOSE  104 (H) 04/29/2021 0313   CALCIUM 9.2 05/05/2021 1545   AST 15 04/18/2021 2238   ALT 13 04/18/2021 2238   ALKPHOS 71 04/18/2021 2238   BILITOT 0.5 04/18/2021 2238   BILITOT 0.6 02/01/2021 1125   PROT 5.3 (L) 04/18/2021 2238   PROT 5.9 (L) 02/01/2021 1125   ALBUMIN 3.0 (L) 04/18/2021 2238   ALBUMIN 4.0 02/01/2021 1125    RADIOGRAPHIC STUDIES: NM PET Image Restage (PS) Skull Base to Thigh (F-18 FDG)  Result Date: 04/15/2021 CLINICAL DATA:  Subsequent treatment strategy for lung nodule. EXAM: NUCLEAR MEDICINE PET SKULL BASE TO THIGH TECHNIQUE: 10.7 mCi F-18 FDG was injected intravenously. Full-ring PET imaging was performed from the skull base to thigh after the radiotracer. CT data was obtained and used for attenuation correction and anatomic localization. Fasting blood glucose: 95 mg/dl COMPARISON:  CT chest 04/14/2021, 02/16/2021, 05/07/2019 and PET 03/12/2019. FINDINGS: Mediastinal blood pool activity: SUV max 2.3 Liver activity: SUV max NA NECK: No abnormal hypermetabolism. Incidental CT findings: None. CHEST: No hypermetabolic mediastinal, hilar or axillary lymph nodes. Left upper lobe nodule has enlarged and is now hypermetabolic, measuring 2.0 x 2.3 cm (9/28) with an SUV max of 7.9, compared to 8 x 11 mm and hypometabolic on 93/26/7124. Other scattered tiny pulmonary nodules are below the size threshold for PET resolution. A 6 mm nodule along the minor fissure (9/35) has been chronically stable and is considered benign. Incidental CT findings: Atherosclerotic calcification of the aorta and coronary arteries. Heart is enlarged. No pericardial effusion. Trace right pleural fluid. ABDOMEN/PELVIS: No abnormal hypermetabolism in the liver, adrenal glands,  spleen or pancreas. No hypermetabolic lymph nodes. Incidental CT findings: Liver, gallbladder and adrenal glands are unremarkable. 3.2 cm exophytic low-attenuation lesion off the upper pole right kidney is likely a cyst. Kidneys, spleen, pancreas, stomach and bowel are otherwise unremarkable. Atherosclerotic calcification of the aorta. Prostate is slightly enlarged. SKELETON: Uptake within the lower thoracic spinal cord appears similar and is nonspecific. Otherwise, no abnormal hypermetabolism. Incidental CT findings: Degenerative changes in the spine. IMPRESSION: 1. Enlarging hypermetabolic left upper lobe nodule, most compatible with stage I A primary bronchogenic carcinoma. 2. Trace right pleural fluid. 3. Prostate is enlarged. 4. Aortic atherosclerosis (ICD10-I70.0). Coronary artery calcification. Electronically Signed   By: Lorin Picket M.D.   On: 04/15/2021 15:00   DG CHEST PORT 1 VIEW  Result Date: 04/26/2021 CLINICAL DATA:  Status post bronchoscopy and biopsy EXAM: PORTABLE CHEST 1 VIEW COMPARISON:  04/25/2021 FINDINGS: Left chest cardiac device with leads in the right atrium and ventricle. Unchanged cardiac and mediastinal contours. A surgical clip now projects over the left upper lobe, with any residual nodule possibly obscured by the cardiac device. No focal pulmonary opacity. No pleural effusion or pneumothorax. No acute osseous abnormality. IMPRESSION: No pneumothorax seen, status post bronchoscopy and biopsy. Electronically Signed   By: Merilyn Baba M.D.   On: 04/26/2021 16:03   DG Chest Port 1 View  Result Date: 04/25/2021 CLINICAL DATA:  70 year old male with enlarging left upper lobe pulmonary nodule EXAM: PORTABLE CHEST 1 VIEW COMPARISON:  Prior chest x-ray 04/17/2021 FINDINGS: Left subclavian approach cardiac rhythm maintenance device. Leads project over the right atrium and right ventricle. Cardiac and mediastinal contours are within normal limits. Subtle nodular opacity projects over  the left mid lung partially obscured by the generator of the cardiac rhythm maintenance device. The nodule measures approximately 2.5 cm. No pneumothorax or pleural effusion. No acute osseous abnormality. IMPRESSION: 1. No active disease. 2.  Known left upper lobe pulmonary nodule partially obscured by the overlying generator from the cardiac rhythm maintenance device. The nodule measures approximately 2.5 cm. Electronically Signed   By: Jacqulynn Cadet M.D.   On: 04/25/2021 08:07   DG Chest Port 1 View  Result Date: 04/17/2021 CLINICAL DATA:  Chest pain. EXAM: PORTABLE CHEST 1 VIEW COMPARISON:  February 14, 2021. FINDINGS: Stable cardiomegaly. Left-sided defibrillator is unchanged in position. Mildly increased bibasilar interstitial densities are noted concerning for possible edema or atelectasis. Bony thorax is unremarkable. IMPRESSION: Mildly increased bibasilar interstitial densities are noted concerning for possible edema or atelectasis. Electronically Signed   By: Marijo Conception M.D.   On: 04/17/2021 13:51   CUP PACEART REMOTE DEVICE CHECK  Result Date: 04/13/2021 Scheduled remote reviewed. Normal device function.  Over the last 13 days there have been 59 VT, 39 monitored VT, 3,384 NSVT 57 of 59 events pace terminated. Pt. was seen in the office 10/13 and is mildly aware of the VT Hx of VT ablation 8/4, Sotalol, mexiletine prescribed 82 SVT events as well Next remote 91 days. Route to triage LR  VAS [place] UPPER EXTREMITY VENOUS DUPLEX  Result Date: 04/29/2021 UPPER VENOUS STUDY  Patient Name:  John [name]  Date of Exam:   04/29/2021 Medical Rec #: [id number]         Accession #:    [id number] Date of Birth: 10/11/1950         Patient Gender: M Patient Age:   7 years Exam Location:  [facility] Procedure:      VAS [place] UPPER EXTREMITY VENOUS DUPLEX Referring Phys: Vance Gather --------------------------------------------------------------------------------  Indications: Pain, Swelling, and  Erythema Comparison Study: No prior studies. Performing Technologist: Darlin Coco RDMS, RVT  Examination Guidelines: A complete evaluation includes B-mode imaging, spectral Doppler, color Doppler, and power Doppler as needed of all accessible portions of each vessel. Bilateral testing is considered an integral part of a complete examination. Limited examinations for reoccurring indications may be performed as noted.  Right Findings: +----------+------------+---------+-----------+----------+-------+ RIGHT     CompressiblePhasicitySpontaneousPropertiesSummary +----------+------------+---------+-----------+----------+-------+ Subclavian    Full       Yes       Yes                      +----------+------------+---------+-----------+----------+-------+  Left Findings: +----------+------------+---------+-----------+----------+-------+ LEFT      CompressiblePhasicitySpontaneousPropertiesSummary +----------+------------+---------+-----------+----------+-------+ IJV           Full       Yes       Yes                      +----------+------------+---------+-----------+----------+-------+ Subclavian    Full       Yes       Yes                      +----------+------------+---------+-----------+----------+-------+ Axillary      None       No        No                Acute  +----------+------------+---------+-----------+----------+-------+ Brachial      Full                                          +----------+------------+---------+-----------+----------+-------+ Radial        Full                                          +----------+------------+---------+-----------+----------+-------+  Ulnar         Full                                          +----------+------------+---------+-----------+----------+-------+ Cephalic    Partial      Yes       Yes               Acute  +----------+------------+---------+-----------+----------+-------+ Basilic       None        No        No                Acute  +----------+------------+---------+-----------+----------+-------+  Summary:  Right: No evidence of thrombosis in the subclavian.  Left: Findings consistent with acute deep vein thrombosis involving the left axillary vein. Findings consistent with acute superficial vein thrombosis involving the left basilic vein and left cephalic vein.  *See table(s) above for measurements and observations.  Diagnosing physician: Servando Snare MD Electronically signed by Servando Snare MD on 04/29/2021 at 4:27:31 PM.    Final    ECHOCARDIOGRAM LIMITED  Result Date: 04/18/2021    ECHOCARDIOGRAM LIMITED REPORT   Patient Name:   John [name] Date of Exam: 04/18/2021 Medical Rec #:  [id number]        Height:       72.0 in Accession #:    [id number]       Weight:       209.2 lb Date of Birth:  12-Sep-1950        BSA:          2.172 m Patient Age:    32 years         BP:           144/96 mmHg Patient Gender: M                HR:           70 bpm. Exam Location:  Inpatient Procedure: 2D Echo, 3D Echo, Cardiac Doppler and Color Doppler Indications:    I47.2 Ventricular tachycardia  History:        Patient has prior history of Echocardiogram examinations, most                 recent 02/15/2021. CAD, Abnormal ECG, Stroke,                 Signs/Symptoms:Chest Pain; Risk Factors:Current Smoker and                 Diabetes.  Sonographer:    [place] Referring Phys: [id number] [place]  1. Left ventricular ejection fraction, by estimation, is 25 to 30%. Left ventricular ejection fraction by 3D volume is 27 %. The left ventricle has severely decreased function. The left ventricle demonstrates regional wall motion abnormalities (see scoring diagram/findings for description). The left ventricular internal cavity size was moderately dilated. Left ventricular diastolic parameters are indeterminate. Elevated left ventricular end-diastolic pressure. There is severe hypokinesis of  the left ventricular, basal-mid inferoseptal wall and lateral wall. There is akinesis of the left ventricular, basal-mid inferior wall and inferolateral wall.  2. Right ventricular systolic function is normal. The right ventricular size is mildly enlarged. There is normal pulmonary artery systolic pressure. The estimated right ventricular systolic pressure is 41.3 mmHg.  3. The mitral valve is normal in structure. Mild mitral valve regurgitation.  No evidence of mitral stenosis.  4. The aortic valve is normal in structure. Aortic valve regurgitation is not visualized. No aortic stenosis is present.  5. Aortic dilatation noted. There is mild dilatation of the aortic root, measuring 38 mm. There is mild dilatation of the ascending aorta, measuring 43 mm.  6. The inferior vena cava is dilated in size with <50% respiratory variability, suggesting right atrial pressure of 15 mmHg.  7. Compared to study dated 02/15/2021, LVF has declined FINDINGS  Left Ventricle: Left ventricular ejection fraction, by estimation, is 25 to 30%. Left ventricular ejection fraction by 3D volume is 27 %. The left ventricle has severely decreased function. The left ventricle demonstrates regional wall motion abnormalities. Severe hypokinesis of the left ventricular, basal-mid inferoseptal wall and lateral wall. The left ventricular internal cavity size was moderately dilated. There is no left ventricular hypertrophy. Left ventricular diastolic parameters are  indeterminate. Elevated left ventricular end-diastolic pressure. Right Ventricle: The right ventricular size is mildly enlarged. No increase in right ventricular wall thickness. Right ventricular systolic function is normal. There is normal pulmonary artery systolic pressure. The tricuspid regurgitant velocity is 2.17  m/s, and with an assumed right atrial pressure of 15 mmHg, the estimated right ventricular systolic pressure is 68.1 mmHg. Left Atrium: Left atrial size was normal in size.  Right Atrium: Right atrial size was normal in size. Pericardium: There is no evidence of pericardial effusion. Mitral Valve: The mitral valve is normal in structure. Mild mitral valve regurgitation. No evidence of mitral valve stenosis. Tricuspid Valve: The tricuspid valve is normal in structure. Tricuspid valve regurgitation is trivial. No evidence of tricuspid stenosis. Aortic Valve: The aortic valve is normal in structure. Aortic valve regurgitation is not visualized. No aortic stenosis is present. Pulmonic Valve: The pulmonic valve was normal in structure. Pulmonic valve regurgitation is trivial. No evidence of pulmonic stenosis. Aorta: Aortic dilatation noted. There is mild dilatation of the aortic root, measuring 38 mm. There is mild dilatation of the ascending aorta, measuring 43 mm. Venous: The inferior vena cava is dilated in size with less than 50% respiratory variability, suggesting right atrial pressure of 15 mmHg. IAS/Shunts: No atrial level shunt detected by color flow Doppler. LEFT VENTRICLE PLAX 2D LVIDd:         6.30 cm         Diastology LVIDs:         6.10 cm         LV e' medial:    4.58 cm/s LV PW:         1.60 cm         LV E/e' medial:  18.2 LV IVS:        1.10 cm         LV e' lateral:   5.10 cm/s LVOT diam:     2.30 cm         LV E/e' lateral: 16.3 LV SV:         67 LV SV Index:   31 LVOT Area:     4.15 cm        3D Volume EF                                LV 3D EF:    Left  ventricul LV Volumes (MOD)                            ar LV vol d, MOD    188.0 ml                   ejection A2C:                                        fraction LV vol d, MOD    188.0 ml                   by 3D A4C:                                        volume is LV vol s, MOD    132.0 ml                   27 %. A2C: LV vol s, MOD    160.0 ml A4C:                           3D Volume EF: LV SV MOD A2C:   56.0 ml       3D EF:        27 % LV SV MOD A4C:   188.0 ml      LV EDV:        216 ml LV SV MOD BP:    38.0 ml       LV ESV:       157 ml                                LV SV:        59 ml RIGHT VENTRICLE            IVC RV S prime:     9.30 cm/s  IVC diam: 2.20 cm TAPSE (M-mode): 1.8 cm LEFT ATRIUM             Index        RIGHT ATRIUM           Index LA diam:        4.20 cm 1.93 cm/m   RA Area:     18.40 cm LA Vol (A2C):   72.7 ml 33.48 ml/m  RA Volume:   48.70 ml  22.42 ml/m LA Vol (A4C):   52.4 ml 24.13 ml/m LA Biplane Vol: 64.9 ml 29.88 ml/m  AORTIC VALVE             PULMONIC VALVE LVOT Vmax:   76.40 cm/s  PR End Diast Vel: 1.80 msec LVOT Vmean:  50.500 cm/s LVOT VTI:    0.161 m  AORTA Ao Root diam: 3.80 cm Ao Asc diam:  4.30 cm MITRAL VALVE                  TRICUSPID VALVE MV Area (PHT): 4.06 cm       TR Peak grad:   18.8 mmHg MV Decel Time: 187 msec       TR Vmax:        217.00 cm/s MR Peak  grad:    79.6 mmHg MR Mean grad:    51.0 mmHg    SHUNTS MR Vmax:         446.00 cm/s  Systemic VTI:  0.16 m MR Vmean:        336.0 cm/s   Systemic Diam: 2.30 cm MR PISA:         1.01 cm MR PISA Eff ROA: 8 mm MR PISA Radius:  0.40 cm MV E velocity: 83.30 cm/s MV A velocity: 69.80 cm/s MV E/A ratio:  1.19 Fransico Him MD Electronically signed by Fransico Him MD Signature Date/Time: 04/18/2021/11:04:20 AM    Final    CT Super D Chest Wo Contrast  Result Date: 04/15/2021 CLINICAL DATA:  Pulmonary nodule. EXAM: CT CHEST WITHOUT CONTRAST TECHNIQUE: Multidetector CT imaging of the chest was performed using thin slice collimation for electromagnetic bronchoscopy planning purposes, without intravenous contrast. COMPARISON:  02/16/2021 07/26/2020, 02/28/2020, 02/10/2020, 07/29/2019 and 05/07/2019. FINDINGS: Cardiovascular: Atherosclerotic calcification of the aorta, aortic valve and coronary arteries. Heart is enlarged. No pericardial effusion. Mediastinum/Nodes: No pathologically enlarged mediastinal or axillary lymph nodes. Hilar regions are difficult to definitively evaluate without IV  contrast. Esophagus is grossly unremarkable. Lungs/Pleura: 6 mm nodule along the minor fissure (7/84), unchanged from 05/07/2019 and considered benign. Spiculated nodule in the posterior left upper lobe measures 2.0 x 2.1 cm (7/62), stable from 02/16/2021 but significantly increased from remote prior exam of 05/07/2019, at which time it measured 10 x 11 mm. No pleural fluid. Airway is unremarkable. Upper Abdomen: Visualized portions of the liver, gallbladder, adrenal glands unremarkable. 2.8 cm low-density exophytic lesion off the upper pole right kidney, incompletely visualized. Visualized portions of the left kidney, spleen, pancreas, stomach and bowel are grossly unremarkable. Musculoskeletal: Degenerative changes in the spine. Flowing anterior osteophytosis in the thoracic spine. No worrisome lytic or sclerotic lesions. IMPRESSION: 1. Spiculated left upper lobe nodule, increased in size from 05/07/2019 and most indicative of primary bronchogenic carcinoma. PET performed today and dictated separately. 2. Aortic atherosclerosis (ICD10-I70.0). Coronary artery calcification. Electronically Signed   By: Lorin Picket M.D.   On: 04/15/2021 14:48   DG C-ARM BRONCHOSCOPY  Result Date: 04/26/2021 C-ARM BRONCHOSCOPY: Fluoroscopy was utilized by the requesting physician.  No radiographic interpretation.    PERFORMANCE STATUS (ECOG) : 1 - Symptomatic but completely ambulatory  Review of Systems  Respiratory:  Positive for cough.   Neurological:  Positive for weakness.  Unless otherwise noted, a complete review of systems is negative.  Physical Exam General: NAD Cardiovascular: regular rate and rhythm Pulmonary: clear ant fields Abdomen: soft, nontender, + bowel sounds GU: no suprapubic tenderness Extremities: left upper extremity edema d/t DVT Neurological: Weakness but otherwise nonfocal, AAO x3 mood appropriate   IMPRESSION:  John [name] presents to the clinic today with his wife. He complains  of some mild left arm pain due to DVT. Is currently taking Eliquis. Patient had a scheduled appointment with Dr. Valeta Harms on yesterday to discuss his biopsy results however he canceled due to pain and multiple appointments.  Unfortunately patient was unclear about the reasoning of this appointment.  I will have my nurse to contact Dr. Juline Patch office and attempt to reschedule as patient is concerned about traveling back and forth.  Neither he or his wife drives and they live in [place] with limited transportation options.  I introduced myself and the role of palliative in collaboration with his medical team.  Patient and wife verbalized understanding and appreciation.  They are emotional expressing  concerns about final diagnoses, next steps, and expectations.  Emotional support provided and assured patient once he has met with Dr. Valeta Harms and his team he will have a better understanding of what he is facing and future expectations.  I approached empathetically goals of care in addition to introduction to MOST form.  Patient and wife expressed interest in further discussions however they would like to discuss at a later time once they understand his current illness and disease trajectory.  Understandably this is appropriate and acknowledge patient's request.  I shared with patient once we have a better understanding of his care plan we will plan to revisit goals of care discussions.  Both he and his wife understand we are available to assist them in navigating his health and decisions.  Patient and wife verbalized understanding and appreciation of support.  Jarrett Soho, RN spoke with Dr. Juline Patch office and patient has been scheduled for Monday at 2 PM.  Patient and wife notified and verbalized understanding of appointment.  They are hopeful this can be a virtual visit despite the significance of the appointment they are concerned about the ability to secure transportation.  We will begin arranging in case visit is  not able to be done virtually.  All questions answered and support provided.  PLAN: Patient to follow-up with Dr. Valeta Harms for ongoing medical discussions and bronchoscopy results. We will plan to discuss further his advanced directives, MOST form in the future once he has a better understanding of his disease, trajectory, and care plan. I will plan to see patient back in the clinic pending upcoming visits and oncology referral.   Patient expressed understanding and was in agreement with this plan. He also understands that He can call the clinic at any time with any questions, concerns, or complaints.     Time Total: 45 min.   Visit consisted of counseling and education dealing with the complex and emotionally intense issues of symptom management and palliative care in the setting of serious and potentially life-threatening illness.Greater than 50%  of this time was spent counseling and coordinating care related to the above assessment and plan.  Signed by: Alda Lea, AGPCNP-BC Palliative Medicine Team

## 2021-05-09 ENCOUNTER — Telehealth: Payer: Self-pay | Admitting: Family Medicine

## 2021-05-09 ENCOUNTER — Other Ambulatory Visit: Payer: Self-pay

## 2021-05-09 ENCOUNTER — Telehealth: Payer: Self-pay | Admitting: *Deleted

## 2021-05-09 ENCOUNTER — Telehealth: Payer: Medicare Other

## 2021-05-09 ENCOUNTER — Telehealth (INDEPENDENT_AMBULATORY_CARE_PROVIDER_SITE_OTHER): Payer: Medicare Other | Admitting: Primary Care

## 2021-05-09 ENCOUNTER — Encounter: Payer: Self-pay | Admitting: Primary Care

## 2021-05-09 DIAGNOSIS — C3492 Malignant neoplasm of unspecified part of left bronchus or lung: Secondary | ICD-10-CM | POA: Diagnosis not present

## 2021-05-09 NOTE — Telephone Encounter (Signed)
  Care Management   Follow Up Note   05/09/2021 Name: John [name] MRN: [id number] DOB: [birth year]   Referred by: [name], MD Reason for referral : Chronic Care Management (DM2, CHF, COPD)   A second unsuccessful telephone outreach was attempted today. The patient was referred to the case management team for assistance with care management and care coordination.   Follow Up Plan: Telephone follow up appointment with care management team member scheduled for:  upon care guide rescheduling.  Jacqlyn Larsen RNC, BSN RN Case Manager [facility] [phone]

## 2021-05-09 NOTE — Patient Instructions (Addendum)
Cytology left upper lobe pulmonary nodule was positive for malignant cells consistent with non-small cell carcinoma Referring to radiation/oncology Follow-up in 6 months or sooner if needed

## 2021-05-09 NOTE — Progress Notes (Signed)
Virtual Visit via Video Note  I connected with John [name] on 05/09/21 at  2:00 PM EST by a video enabled telemedicine application and verified that I am speaking with the correct person using two identifiers.  Location: Patient: Home Provider: Office    I discussed the limitations of evaluation and management by telemedicine and the availability of in person appointments. The patient expressed understanding and agreed to proceed.  History of Present Illness: 70 year old male, former smoker. PMH significant for lung nodules. Patient of Dr. Valeta Harms.   Previous [facility] pulmonary encounter:  This is a 70 year old gentleman 54-pack-year history of smoking, underwent CT scan imaging which revealed a left upper lobe lung nodule followed by [name].  Patient ultimately underwent PET scan imaging in September which revealed low-level PET uptake, SUV 1.4 within the nodule.  And continued follow-up proceeded.  Patient had a repeat super D noncontrasted imaging in November 2020 and referred to see me for evaluation of lung nodule.  Today in the office we discussed the patient's lung nodule imaging and reviewed the images back from the initial pictures in May 2020 that first discover the nodule.  He also has a stable contralateral nodule.  This nodule has been present since 2014.  As for the left upper lobe nodule it has started to evolve a little bit went from more solid to a subsolid form.  Otherwise the patient has no significant symptoms and feels as if his dyspnea is well controlled.  Patient denies fevers chills night sweats weight loss or hemoptysis. Retired from [employer] and spatial [employer], Chartered certified accountant for [employer].   OV 08/13/2019: Here today for CT scan follow-up of lung nodule.  Patient's respiratory symptoms are stable.  He has been using his Arnuity inhaler as well as albuterol as needed.  Has not needed this recently.  Stable use of Arnuity and rinsing of mouth in the morning.  Is able to work  outside in his yard.  Working on some brush piles to help clean up the yard for the springtime.  Does get some shortness of breath with significant exertion but overall states he is pretty stable.  He did review his results in my chart for his lung nodule follow-up and is happy about the fact that the document stability.  Patient denies hemoptysis weight loss fevers chills.  OV 08/02/2020: Here today for CT scan follow-up of lung nodule.  He has had this for several years originally followed by [name].  Has COPD with chronic bronchitis type symptoms.  Currently managed with Arnuity plus as needed albuterol.  Does not need refills of inhalers at this time.  He had a recent CT scan of the chest in July 26, 2020.  This follow-up CT shows a stable thoracic aneurysm at 4.2 cm.  He has small bilateral pulmonary nodules.  The left upper lobe nodule is stable in comparison to follow-up images.  He is a former smoker with a significant greater than 50+ pack year history.  We discussed enrollment in lung cancer screening moving forward.  OV 03/30/2021: Here today for follow-up after recent hospitalization.  His CT scan follow-up that was completed during his hospitalization showed significant rapid enlargement of his left upper lobe pulmonary nodule.  This is concerning for malignancy I reviewed this today with patient the office.  He was recently admitted however with a MI and had a drug-eluting stent placed by interventional cardiology.  At the time was also found to have a stroke.  He has now been discharged on Plavix plus Eliquis.  We need to work with his cardiologist regarding anticoagulation needs with planned biopsy.  05/09/2021- Interim hx  Patient contacted today by video visit for follow-up post bronchoscopy. He is doing well. No acute complaints. Dyspnea is baseline. Not recent PFTs on file. Patient underwent bronchoscopy with bx on 06/26/20 to evaluate enlarging left upper lobe pulmonary nodule.  Cytology LUL needle biopsy was positive for malignant cells consistent with non-small cell carcinoma. We reviewed results today with patient and plan is to refer him to radiation/oncology. He is requesting visit be done virtually as transportation is hard for patient and he does not live in [place]. Denies hemoptysis or chest pain.   Observations/Objective:  - Patient appears well; No overt shortness of breath, wheezing or cough  Assessment and Plan:  Pulmonary nodule:  - HX LUL pulmonary nodule. PET scan on 04/15/21 showed enlarging hypermetabolic left upper lobe nodules, most compatible with stage 1A primary bronchogenic carcinoma. He underwent bronchoscopy with Dr. Valeta Harms on 04/26/21, cytology was positive for malignant cells consistent with non-small cell carcinoma. We reviewed these results with patient today. He would likely not be good surgical candidate d.t cardiac history. Referring patient to radiation/oncology.   Hx ventricular tachycardia: - Dr. Valeta Harms plans to reach out to Dr. Curt Bears regarding potential need for ablation   Follow Up Instructions:  - 6 months with Dr. Valeta Harms or sooner    I discussed the assessment and treatment plan with the patient. The patient was provided an opportunity to ask questions and all were answered. The patient agreed with the plan and demonstrated an understanding of the instructions.   The patient was advised to call back or seek an in-person evaluation if the symptoms worsen or if the condition fails to improve as anticipated.  I provided 22 minutes of non-face-to-face time during this encounter.   Martyn Ehrich, NP

## 2021-05-09 NOTE — Progress Notes (Deleted)
@Patient  ID: John [name], male    DOB: 1951-03-22, 70 y.o.   MRN: [id number]  No chief complaint on file.   Referring provider: [name], MD  HPI: 70 year old male, former smoker. PMH significant for lung nodules. Patient of Dr. Valeta Harms.   Previous [facility] pulmonary encounter:  This is a 70 year old gentleman 54-pack-year history of smoking, underwent CT scan imaging which revealed a left upper lobe lung nodule followed by [name].  Patient ultimately underwent PET scan imaging in September which revealed low-level PET uptake, SUV 1.4 within the nodule.  And continued follow-up proceeded.  Patient had a repeat super D noncontrasted imaging in November 2020 and referred to see me for evaluation of lung nodule.  Today in the office we discussed the patient's lung nodule imaging and reviewed the images back from the initial pictures in May 2020 that first discover the nodule.  He also has a stable contralateral nodule.  This nodule has been present since 2014.  As for the left upper lobe nodule it has started to evolve a little bit went from more solid to a subsolid form.  Otherwise the patient has no significant symptoms and feels as if his dyspnea is well controlled.  Patient denies fevers chills night sweats weight loss or hemoptysis. Retired from Interior and spatial designer, Chartered certified accountant for [employer].   OV 08/13/2019: Here today for CT scan follow-up of lung nodule.  Patient's respiratory symptoms are stable.  He has been using his Arnuity inhaler as well as albuterol as needed.  Has not needed this recently.  Stable use of Arnuity and rinsing of mouth in the morning.  Is able to work outside in his yard.  Working on some brush piles to help clean up the yard for the springtime.  Does get some shortness of breath with significant exertion but overall states he is pretty stable.  He did review his results in my chart for his lung nodule follow-up and is happy about the fact that the  document stability.  Patient denies hemoptysis weight loss fevers chills.  OV 08/02/2020: Here today for CT scan follow-up of lung nodule.  He has had this for several years originally followed by [name].  Has COPD with chronic bronchitis type symptoms.  Currently managed with Arnuity plus as needed albuterol.  Does not need refills of inhalers at this time.  He had a recent CT scan of the chest in July 26, 2020.  This follow-up CT shows a stable thoracic aneurysm at 4.2 cm.  He has small bilateral pulmonary nodules.  The left upper lobe nodule is stable in comparison to follow-up images.  He is a former smoker with a significant greater than 50+ pack year history.  We discussed enrollment in lung cancer screening moving forward.  OV 03/30/2021: Here today for follow-up after recent hospitalization.  His CT scan follow-up that was completed during his hospitalization showed significant rapid enlargement of his left upper lobe pulmonary nodule.  This is concerning for malignancy I reviewed this today with patient the office.  He was recently admitted however with a MI and had a drug-eluting stent placed by interventional cardiology.  At the time was also found to have a stroke.  He has now been discharged on Plavix plus Eliquis.  We need to work with his cardiologist regarding anticoagulation needs with planned biopsy.   05/09/2021- Interim hx  Patient contacted today by video visit for follow-up post bronchoscopy. He is doing well.  No acute complaints.    A. LUNG, LUL, NEEDLE  BIOPSY FORCEPS:  - Malignant cells consistent with non-small cell carcinoma   Plan set up for radiation oncology   Transportation is hard for patient and he does not live in [place].    Allergies  Allergen Reactions   Omega-3 Fatty Acids Hives and Itching   Benazepril Other (See Comments)    hyperkalemia   Fish Allergy Itching    Immunization History  Administered Date(s) Administered   Fluad Quad(high  Dose 65+) 02/12/2019, 03/10/2020, 04/14/2021   Influenza, High Dose Seasonal PF 03/15/2017, 03/14/2018   Influenza, Seasonal, Injecte, Preservative Fre 04/07/2013   Influenza,inj,Quad PF,6+ Mos 06/04/2014, 04/08/2015, 04/18/2016   Influenza-Unspecified 02/25/2019   Moderna Sars-Covid-2 Vaccination 08/06/2019, 09/24/2019, 05/21/2020   Pneumococcal Conjugate-13 01/02/2014   Pneumococcal Polysaccharide-23 01/07/2016   Tdap 12/29/2012    Past Medical History:  Diagnosis Date   Atrial flutter ([place])    s/p cardioversion   Coronary artery disease    Diabetes mellitus    GERD (gastroesophageal reflux disease)    History of nuclear stress test 04/04/2011   lexiscan; mod-large in size fixed inferolateral defect (scar); non-diagnostic for ischemia; low risk scan    Hyperlipidemia    Hypertension    Ischemic cardiomyopathy    Left foot drop    r/t past disk srugery - uses Kevlar brace   Myocardial infarction ([place])    posterior MI   Osteomyelitis ([place])    s/p left 2nd toe amputation in 01/2021   Pulmonary nodule    Recurrent ventricular tachycardia    Shortness of breath    Sleep apnea    on CPAP; 04/28/2007 split-night - AHI during total sleep 44.43/hr and REM 72.56/hr    Tobacco History: Social History   Tobacco Use  Smoking Status Former   Packs/day: 1.00   Years: 50.00   Pack years: 50.00   Types: Cigarettes   Quit date: 07/20/2016   Years since quitting: 4.8  Smokeless Tobacco Never   Counseling given: Not Answered   Outpatient Medications Prior to Visit  Medication Sig Dispense Refill   albuterol (VENTOLIN HFA) 108 (90 Base) MCG/ACT inhaler Inhale 2 puffs into the lungs every 6 (six) hours as needed for wheezing or shortness of breath. 1 each 6   amiodarone (PACERONE) 400 MG tablet Take 1 tablet (400 mg total) by mouth 2 (two) times daily. 60 tablet 1   apixaban (ELIQUIS) 5 MG TABS tablet Take 2 tablets (10 mg total) by mouth 2 (two) times daily for 7 days, THEN 1 tablet  (5 mg total) 2 (two) times daily for 23 days. 74 tablet 0   clopidogrel (PLAVIX) 75 MG tablet Take 1 tablet (75 mg total) by mouth daily with breakfast.     ENTRESTO 24-26 MG TAKE 1 TABLET BY MOUTH TWICE A DAY 60 tablet 6   Fluticasone Furoate (ARNUITY ELLIPTA) 200 MCG/ACT AEPB Inhale 1 puff into the lungs daily. 30 each 6   insulin aspart (NOVOLOG FLEXPEN) 100 UNIT/ML FlexPen INJECT 5-20 UNITS SUBCUTANEOUSLY WITH LUNCH AND DINNER (Patient taking differently: Inject 6-8 Units into the skin daily as needed for high blood sugar.) 15 mL 1   Insulin Pen Needle (NOVOFINE) 32G X 6 MM MISC 1 each by Other route daily. 100 each 3   JARDIANCE 25 MG TABS tablet TAKE 1 TABLET BY MOUTH EVERY DAY 30 tablet 5   LEVEMIR FLEXTOUCH 100 UNIT/ML FlexTouch Pen INJECT 46 UNITS INTO THE SKIN DAILY. DX:E11.9 15 mL  3   metoprolol tartrate (LOPRESSOR) 50 MG tablet Take 1 tablet (50 mg total) by mouth 2 (two) times daily. 60 tablet 5   mexiletine (MEXITIL) 150 MG capsule Take 2 capsules (300 mg total) by mouth every 12 (twelve) hours. 120 capsule 5   montelukast (SINGULAIR) 10 MG tablet TAKE 1 TABLET BY MOUTH EVERY DAY (Patient taking differently: Take 10 mg by mouth daily.) 90 tablet 3   nitroGLYCERIN (NITROSTAT) 0.4 MG SL tablet PLACE 1 TABLET (0.4 MG TOTAL) UNDER THE TONGUE EVERY 5 (FIVE) MINUTES AS NEEDED FOR CHEST PAIN. 25 tablet 1   ONETOUCH ULTRA test strip USE TO CHECK BLOOD SUGAR 3 TIMES A DAY (E11.9) 100 strip 2   rosuvastatin (CRESTOR) 40 MG tablet Take 1 tablet (40 mg total) by mouth daily. 30 tablet 0   spironolactone (ALDACTONE) 25 MG tablet Take 0.5 tablets (12.5 mg total) by mouth daily. 15 tablet 0   No facility-administered medications prior to visit.      Review of Systems  Review of Systems   Physical Exam  There were no vitals taken for this visit. Physical Exam   Lab Results:  CBC    Component Value Date/Time   WBC 8.7 05/05/2021 1545   WBC 8.7 04/29/2021 0313   RBC 5.86 (H)  05/05/2021 1545   RBC 5.51 04/29/2021 0313   HGB 17.5 05/05/2021 1545   HCT 52.6 (H) 05/05/2021 1545   PLT 254 05/05/2021 1545   MCV 90 05/05/2021 1545   MCH 29.9 05/05/2021 1545   MCH 29.9 04/29/2021 0313   MCHC 33.3 05/05/2021 1545   MCHC 32.0 04/29/2021 0313   RDW 12.6 05/05/2021 1545   LYMPHSABS 1.0 04/17/2021 1250   MONOABS 0.1 04/17/2021 1250   EOSABS 0.0 04/17/2021 1250   BASOSABS 0.0 04/17/2021 1250    BMET    Component Value Date/Time   NA 147 (H) 05/05/2021 1545   K 4.6 05/05/2021 1545   CL 108 (H) 05/05/2021 1545   CO2 22 05/05/2021 1545   GLUCOSE 103 (H) 05/05/2021 1545   GLUCOSE 104 (H) 04/29/2021 0313   BUN 13 05/05/2021 1545   CREATININE 1.15 05/05/2021 1545   CREATININE 0.96 09/15/2019 1538   CALCIUM 9.2 05/05/2021 1545   GFRNONAA >60 04/29/2021 0313   GFRNONAA 80 09/15/2019 1538   GFRAA >60 02/28/2020 1910   GFRAA 92 09/15/2019 1538    BNP    Component Value Date/Time   BNP 169.7 (H) 01/17/2021 2233   BNP 282 (H) 09/21/2017 0935    ProBNP    Component Value Date/Time   PROBNP 411 (H) 02/14/2018 1431    Imaging: NM PET Image Restage (PS) Skull Base to Thigh (F-18 FDG)  Result Date: 04/15/2021 CLINICAL DATA:  Subsequent treatment strategy for lung nodule. EXAM: NUCLEAR MEDICINE PET SKULL BASE TO THIGH TECHNIQUE: 10.7 mCi F-18 FDG was injected intravenously. Full-ring PET imaging was performed from the skull base to thigh after the radiotracer. CT data was obtained and used for attenuation correction and anatomic localization. Fasting blood glucose: 95 mg/dl COMPARISON:  CT chest 04/14/2021, 02/16/2021, 05/07/2019 and PET 03/12/2019. FINDINGS: Mediastinal blood pool activity: SUV max 2.3 Liver activity: SUV max NA NECK: No abnormal hypermetabolism. Incidental CT findings: None. CHEST: No hypermetabolic mediastinal, hilar or axillary lymph nodes. Left upper lobe nodule has enlarged and is now hypermetabolic, measuring 2.0 x 2.3 cm (9/28) with an SUV  max of 7.9, compared to 8 x 11 mm and hypometabolic on 30/86/5784. Other scattered tiny  pulmonary nodules are below the size threshold for PET resolution. A 6 mm nodule along the minor fissure (9/35) has been chronically stable and is considered benign. Incidental CT findings: Atherosclerotic calcification of the aorta and coronary arteries. Heart is enlarged. No pericardial effusion. Trace right pleural fluid. ABDOMEN/PELVIS: No abnormal hypermetabolism in the liver, adrenal glands, spleen or pancreas. No hypermetabolic lymph nodes. Incidental CT findings: Liver, gallbladder and adrenal glands are unremarkable. 3.2 cm exophytic low-attenuation lesion off the upper pole right kidney is likely a cyst. Kidneys, spleen, pancreas, stomach and bowel are otherwise unremarkable. Atherosclerotic calcification of the aorta. Prostate is slightly enlarged. SKELETON: Uptake within the lower thoracic spinal cord appears similar and is nonspecific. Otherwise, no abnormal hypermetabolism. Incidental CT findings: Degenerative changes in the spine. IMPRESSION: 1. Enlarging hypermetabolic left upper lobe nodule, most compatible with stage I A primary bronchogenic carcinoma. 2. Trace right pleural fluid. 3. Prostate is enlarged. 4. Aortic atherosclerosis (ICD10-I70.0). Coronary artery calcification. Electronically Signed   By: Lorin Picket M.D.   On: 04/15/2021 15:00   DG CHEST PORT 1 VIEW  Result Date: 04/26/2021 CLINICAL DATA:  Status post bronchoscopy and biopsy EXAM: PORTABLE CHEST 1 VIEW COMPARISON:  04/25/2021 FINDINGS: Left chest cardiac device with leads in the right atrium and ventricle. Unchanged cardiac and mediastinal contours. A surgical clip now projects over the left upper lobe, with any residual nodule possibly obscured by the cardiac device. No focal pulmonary opacity. No pleural effusion or pneumothorax. No acute osseous abnormality. IMPRESSION: No pneumothorax seen, status post bronchoscopy and biopsy.  Electronically Signed   By: Merilyn Baba M.D.   On: 04/26/2021 16:03   DG Chest Port 1 View  Result Date: 04/25/2021 CLINICAL DATA:  70 year old male with enlarging left upper lobe pulmonary nodule EXAM: PORTABLE CHEST 1 VIEW COMPARISON:  Prior chest x-ray 04/17/2021 FINDINGS: Left subclavian approach cardiac rhythm maintenance device. Leads project over the right atrium and right ventricle. Cardiac and mediastinal contours are within normal limits. Subtle nodular opacity projects over the left mid lung partially obscured by the generator of the cardiac rhythm maintenance device. The nodule measures approximately 2.5 cm. No pneumothorax or pleural effusion. No acute osseous abnormality. IMPRESSION: 1. No active disease. 2. Known left upper lobe pulmonary nodule partially obscured by the overlying generator from the cardiac rhythm maintenance device. The nodule measures approximately 2.5 cm. Electronically Signed   By: Jacqulynn Cadet M.D.   On: 04/25/2021 08:07   DG Chest Port 1 View  Result Date: 04/17/2021 CLINICAL DATA:  Chest pain. EXAM: PORTABLE CHEST 1 VIEW COMPARISON:  February 14, 2021. FINDINGS: Stable cardiomegaly. Left-sided defibrillator is unchanged in position. Mildly increased bibasilar interstitial densities are noted concerning for possible edema or atelectasis. Bony thorax is unremarkable. IMPRESSION: Mildly increased bibasilar interstitial densities are noted concerning for possible edema or atelectasis. Electronically Signed   By: Marijo Conception M.D.   On: 04/17/2021 13:51   CUP PACEART REMOTE DEVICE CHECK  Result Date: 04/13/2021 Scheduled remote reviewed. Normal device function.  Over the last 13 days there have been 59 VT, 39 monitored VT, 3,384 NSVT 57 of 59 events pace terminated. Pt. was seen in the office 10/13 and is mildly aware of the VT Hx of VT ablation 8/4, Sotalol, mexiletine prescribed 82 SVT events as well Next remote 91 days. Route to triage LR  VAS [place] UPPER  EXTREMITY VENOUS DUPLEX  Result Date: 04/29/2021 UPPER VENOUS STUDY  Patient Name:  PAUL TRETTIN  Date of Exam:  04/29/2021 Medical Rec #: [id number]         Accession #:    [id number] Date of Birth: Jun 27, 1950         Patient Gender: M Patient Age:   60 years Exam Location:  [facility] Procedure:      VAS [place] UPPER EXTREMITY VENOUS DUPLEX Referring Phys: Vance Gather --------------------------------------------------------------------------------  Indications: Pain, Swelling, and Erythema Comparison Study: No prior studies. Performing Technologist: Darlin Coco RDMS, RVT  Examination Guidelines: A complete evaluation includes B-mode imaging, spectral Doppler, color Doppler, and power Doppler as needed of all accessible portions of each vessel. Bilateral testing is considered an integral part of a complete examination. Limited examinations for reoccurring indications may be performed as noted.  Right Findings: +----------+------------+---------+-----------+----------+-------+ RIGHT     CompressiblePhasicitySpontaneousPropertiesSummary +----------+------------+---------+-----------+----------+-------+ Subclavian    Full       Yes       Yes                      +----------+------------+---------+-----------+----------+-------+  Left Findings: +----------+------------+---------+-----------+----------+-------+ LEFT      CompressiblePhasicitySpontaneousPropertiesSummary +----------+------------+---------+-----------+----------+-------+ IJV           Full       Yes       Yes                      +----------+------------+---------+-----------+----------+-------+ Subclavian    Full       Yes       Yes                      +----------+------------+---------+-----------+----------+-------+ Axillary      None       No        No                Acute  +----------+------------+---------+-----------+----------+-------+ Brachial      Full                                           +----------+------------+---------+-----------+----------+-------+ Radial        Full                                          +----------+------------+---------+-----------+----------+-------+ Ulnar         Full                                          +----------+------------+---------+-----------+----------+-------+ Cephalic    Partial      Yes       Yes               Acute  +----------+------------+---------+-----------+----------+-------+ Basilic       None       No        No                Acute  +----------+------------+---------+-----------+----------+-------+  Summary:  Right: No evidence of thrombosis in the subclavian.  Left: Findings consistent with acute deep vein thrombosis involving the left axillary vein. Findings consistent with acute superficial vein thrombosis involving the left basilic vein and left cephalic vein.  *See table(s) above for measurements and observations.  Diagnosing physician:  Servando Snare MD Electronically signed by Servando Snare MD on 04/29/2021 at 4:27:31 PM.    Final    ECHOCARDIOGRAM LIMITED  Result Date: 04/18/2021    ECHOCARDIOGRAM LIMITED REPORT   Patient Name:   AURTHER HARLIN Date of Exam: 04/18/2021 Medical Rec #:  [id number]        Height:       72.0 in Accession #:    [id number]       Weight:       209.2 lb Date of Birth:  Nov 06, 1950        BSA:          2.172 m Patient Age:    45 years         BP:           144/96 mmHg Patient Gender: M                HR:           70 bpm. Exam Location:  Inpatient Procedure: 2D Echo, 3D Echo, Cardiac Doppler and Color Doppler Indications:    I47.2 Ventricular tachycardia  History:        Patient has prior history of Echocardiogram examinations, most                 recent 02/15/2021. CAD, Abnormal ECG, Stroke,                 Signs/Symptoms:Chest Pain; Risk Factors:Current Smoker and                 Diabetes.  Sonographer:    Bradley Referring Phys: [id number] [place]   1. Left ventricular ejection fraction, by estimation, is 25 to 30%. Left ventricular ejection fraction by 3D volume is 27 %. The left ventricle has severely decreased function. The left ventricle demonstrates regional wall motion abnormalities (see scoring diagram/findings for description). The left ventricular internal cavity size was moderately dilated. Left ventricular diastolic parameters are indeterminate. Elevated left ventricular end-diastolic pressure. There is severe hypokinesis of the left ventricular, basal-mid inferoseptal wall and lateral wall. There is akinesis of the left ventricular, basal-mid inferior wall and inferolateral wall.  2. Right ventricular systolic function is normal. The right ventricular size is mildly enlarged. There is normal pulmonary artery systolic pressure. The estimated right ventricular systolic pressure is 70.3 mmHg.  3. The mitral valve is normal in structure. Mild mitral valve regurgitation. No evidence of mitral stenosis.  4. The aortic valve is normal in structure. Aortic valve regurgitation is not visualized. No aortic stenosis is present.  5. Aortic dilatation noted. There is mild dilatation of the aortic root, measuring 38 mm. There is mild dilatation of the ascending aorta, measuring 43 mm.  6. The inferior vena cava is dilated in size with <50% respiratory variability, suggesting right atrial pressure of 15 mmHg.  7. Compared to study dated 02/15/2021, LVF has declined FINDINGS  Left Ventricle: Left ventricular ejection fraction, by estimation, is 25 to 30%. Left ventricular ejection fraction by 3D volume is 27 %. The left ventricle has severely decreased function. The left ventricle demonstrates regional wall motion abnormalities. Severe hypokinesis of the left ventricular, basal-mid inferoseptal wall and lateral wall. The left ventricular internal cavity size was moderately dilated. There is no left ventricular hypertrophy. Left ventricular diastolic parameters are   indeterminate. Elevated left ventricular end-diastolic pressure. Right Ventricle: The right ventricular size is mildly enlarged. No increase in right ventricular wall thickness. Right ventricular systolic  function is normal. There is normal pulmonary artery systolic pressure. The tricuspid regurgitant velocity is 2.17  m/s, and with an assumed right atrial pressure of 15 mmHg, the estimated right ventricular systolic pressure is 38.9 mmHg. Left Atrium: Left atrial size was normal in size. Right Atrium: Right atrial size was normal in size. Pericardium: There is no evidence of pericardial effusion. Mitral Valve: The mitral valve is normal in structure. Mild mitral valve regurgitation. No evidence of mitral valve stenosis. Tricuspid Valve: The tricuspid valve is normal in structure. Tricuspid valve regurgitation is trivial. No evidence of tricuspid stenosis. Aortic Valve: The aortic valve is normal in structure. Aortic valve regurgitation is not visualized. No aortic stenosis is present. Pulmonic Valve: The pulmonic valve was normal in structure. Pulmonic valve regurgitation is trivial. No evidence of pulmonic stenosis. Aorta: Aortic dilatation noted. There is mild dilatation of the aortic root, measuring 38 mm. There is mild dilatation of the ascending aorta, measuring 43 mm. Venous: The inferior vena cava is dilated in size with less than 50% respiratory variability, suggesting right atrial pressure of 15 mmHg. IAS/Shunts: No atrial level shunt detected by color flow Doppler. LEFT VENTRICLE PLAX 2D LVIDd:         6.30 cm         Diastology LVIDs:         6.10 cm         LV e' medial:    4.58 cm/s LV PW:         1.60 cm         LV E/e' medial:  18.2 LV IVS:        1.10 cm         LV e' lateral:   5.10 cm/s LVOT diam:     2.30 cm         LV E/e' lateral: 16.3 LV SV:         67 LV SV Index:   31 LVOT Area:     4.15 cm        3D Volume EF                                LV 3D EF:    Left                                              ventricul LV Volumes (MOD)                            ar LV vol d, MOD    188.0 ml                   ejection A2C:                                        fraction LV vol d, MOD    188.0 ml                   by 3D A4C:  volume is LV vol s, MOD    132.0 ml                   27 %. A2C: LV vol s, MOD    160.0 ml A4C:                           3D Volume EF: LV SV MOD A2C:   56.0 ml       3D EF:        27 % LV SV MOD A4C:   188.0 ml      LV EDV:       216 ml LV SV MOD BP:    38.0 ml       LV ESV:       157 ml                                LV SV:        59 ml RIGHT VENTRICLE            IVC RV S prime:     9.30 cm/s  IVC diam: 2.20 cm TAPSE (M-mode): 1.8 cm LEFT ATRIUM             Index        RIGHT ATRIUM           Index LA diam:        4.20 cm 1.93 cm/m   RA Area:     18.40 cm LA Vol (A2C):   72.7 ml 33.48 ml/m  RA Volume:   48.70 ml  22.42 ml/m LA Vol (A4C):   52.4 ml 24.13 ml/m LA Biplane Vol: 64.9 ml 29.88 ml/m  AORTIC VALVE             PULMONIC VALVE LVOT Vmax:   76.40 cm/s  PR End Diast Vel: 1.80 msec LVOT Vmean:  50.500 cm/s LVOT VTI:    0.161 m  AORTA Ao Root diam: 3.80 cm Ao Asc diam:  4.30 cm MITRAL VALVE                  TRICUSPID VALVE MV Area (PHT): 4.06 cm       TR Peak grad:   18.8 mmHg MV Decel Time: 187 msec       TR Vmax:        217.00 cm/s MR Peak grad:    79.6 mmHg MR Mean grad:    51.0 mmHg    SHUNTS MR Vmax:         446.00 cm/s  Systemic VTI:  0.16 m MR Vmean:        336.0 cm/s   Systemic Diam: 2.30 cm MR PISA:         1.01 cm MR PISA Eff ROA: 8 mm MR PISA Radius:  0.40 cm MV E velocity: 83.30 cm/s MV A velocity: 69.80 cm/s MV E/A ratio:  1.19 Fransico Him MD Electronically signed by Fransico Him MD Signature Date/Time: 04/18/2021/11:04:20 AM    Final    CT Super D Chest Wo Contrast  Result Date: 04/15/2021 CLINICAL DATA:  Pulmonary nodule. EXAM: CT CHEST WITHOUT CONTRAST TECHNIQUE: Multidetector CT imaging of the chest was performed  using thin slice collimation for electromagnetic bronchoscopy planning purposes, without intravenous contrast. COMPARISON:  02/16/2021 07/26/2020, 02/28/2020, 02/10/2020, 07/29/2019 and 05/07/2019. FINDINGS: Cardiovascular: Atherosclerotic calcification of the aorta, aortic  valve and coronary arteries. Heart is enlarged. No pericardial effusion. Mediastinum/Nodes: No pathologically enlarged mediastinal or axillary lymph nodes. Hilar regions are difficult to definitively evaluate without IV contrast. Esophagus is grossly unremarkable. Lungs/Pleura: 6 mm nodule along the minor fissure (7/84), unchanged from 05/07/2019 and considered benign. Spiculated nodule in the posterior left upper lobe measures 2.0 x 2.1 cm (7/62), stable from 02/16/2021 but significantly increased from remote prior exam of 05/07/2019, at which time it measured 10 x 11 mm. No pleural fluid. Airway is unremarkable. Upper Abdomen: Visualized portions of the liver, gallbladder, adrenal glands unremarkable. 2.8 cm low-density exophytic lesion off the upper pole right kidney, incompletely visualized. Visualized portions of the left kidney, spleen, pancreas, stomach and bowel are grossly unremarkable. Musculoskeletal: Degenerative changes in the spine. Flowing anterior osteophytosis in the thoracic spine. No worrisome lytic or sclerotic lesions. IMPRESSION: 1. Spiculated left upper lobe nodule, increased in size from 05/07/2019 and most indicative of primary bronchogenic carcinoma. PET performed today and dictated separately. 2. Aortic atherosclerosis (ICD10-I70.0). Coronary artery calcification. Electronically Signed   By: Lorin Picket M.D.   On: 04/15/2021 14:48   DG C-ARM BRONCHOSCOPY  Result Date: 04/26/2021 C-ARM BRONCHOSCOPY: Fluoroscopy was utilized by the requesting physician.  No radiographic interpretation.     Assessment & Plan:   No problem-specific Assessment & Plan notes found for this encounter.     Martyn Ehrich,  NP 05/09/2021

## 2021-05-09 NOTE — Telephone Encounter (Signed)
Patient called to follow up on telephone visit with Jacqlyn Larsen; stated phone rang but noone was there. Gave patient number to call directly.

## 2021-05-09 NOTE — Telephone Encounter (Signed)
Per Dr. Juline Patch staff message- "Yes, we can add him in as a virtual visit"  Visit has been changed to a mychart visit- left voicemail to inform patient.

## 2021-05-10 ENCOUNTER — Telehealth: Payer: Self-pay | Admitting: Nurse Practitioner

## 2021-05-10 ENCOUNTER — Telehealth: Payer: Self-pay | Admitting: Radiation Oncology

## 2021-05-10 NOTE — Telephone Encounter (Signed)
Spoke with patient's wife [name], regarding the Palliative referral/services and all questions were answered and she was in agreement with scheduling visit.  I have scheduled a Telephone Palliative Consult for 05/19/21 @ 8:30 AM

## 2021-05-10 NOTE — Telephone Encounter (Signed)
Pt did have video visit with BW yesterday 11/21. Closing encounter.

## 2021-05-10 NOTE — Telephone Encounter (Signed)
Called pt to schedule consultation w. Dr. Lisbeth Renshaw. No answer, LVM for pt to return call.

## 2021-05-17 ENCOUNTER — Ambulatory Visit: Payer: Medicare Other

## 2021-05-17 ENCOUNTER — Ambulatory Visit (INDEPENDENT_AMBULATORY_CARE_PROVIDER_SITE_OTHER): Payer: Medicare Other | Admitting: Podiatry

## 2021-05-17 ENCOUNTER — Other Ambulatory Visit: Payer: Self-pay

## 2021-05-17 DIAGNOSIS — L84 Corns and callosities: Secondary | ICD-10-CM

## 2021-05-17 DIAGNOSIS — E119 Type 2 diabetes mellitus without complications: Secondary | ICD-10-CM

## 2021-05-17 DIAGNOSIS — Z89422 Acquired absence of other left toe(s): Secondary | ICD-10-CM

## 2021-05-17 DIAGNOSIS — E0843 Diabetes mellitus due to underlying condition with diabetic autonomic (poly)neuropathy: Secondary | ICD-10-CM

## 2021-05-17 DIAGNOSIS — B351 Tinea unguium: Secondary | ICD-10-CM | POA: Diagnosis not present

## 2021-05-17 DIAGNOSIS — M21372 Foot drop, left foot: Secondary | ICD-10-CM | POA: Diagnosis not present

## 2021-05-17 NOTE — Progress Notes (Signed)
SITUATION Reason for Consult: Evaluation for Prefabricated Diabetic Shoes and Bilateral Custom Diabetic Inserts. Patient / Caregiver Report: Patient would like the grey hiking shoes  OBJECTIVE DATA: Patient History / Diagnosis: Diabetes Mellitus with complications  Presence of Diabetic Complications: - Peripheral Neuropathy - Ceripheral Bascular Disease - Cardiovascular Disease - Amputation: Side: Left Level: 2nd toe  Current or Previous Devices: Apex sneakers with [employer] AFO on left  In-Person Foot Examination:  Skin presentation:   Thin, Shiny, Hairless Nail presentation:   Thick, Ingrown, With Fungus Ulcers & Callousing:   Right callous 5th styloid  Toe / Foot Deformities:   - Pes Planus  - Hindfoot Valgus - Forefoot ABduction - Hammertoes - Midfoot collapse   Sensation:    Diminished  Shoe Size: 12.5XW  ORTHOTIC RECOMMENDATION Recommended Devices: - 1x pair prefabricated PDAC approved diabetic shoes: V753M 12.5XW - 3x pair custom-to-patient direct CAM carved diabetic insoles.   GOALS OF SHOES AND INSOLES - Reduce shear and pressure - Reduce / Prevent callus formation - Reduce / Prevent ulceration - Protect the fragile healing compromised diabetic foot.  Patient would benefit from diabetic shoes and inserts as patient has diabetes mellitus and the patient has one or more of the following conditions: - History of partial or complete amputation of the foot - History of previous foot ulceration. - History of pre-ulcerative callus - Peripheral neuropathy with evidence of callus formation - Foot deformity - Poor circulation  ACTIONS PERFORMED Patient was casted for insoles via crush box and measured for shoes via brannock device. Procedure was explained and patient tolerated procedure well. All questions were answered and concerns addressed.  PLAN Insurance to be verified and out of pocket cost communicated to patient. Once cost verified and agreed upon and  diabetic certification received, casts are to be sent to [facility] for fabrication. Patient is to be called for fitting when devices are ready.

## 2021-05-17 NOTE — Progress Notes (Signed)
Thoracic Location of Tumor / Histology: LUL Lung  Patient presented for repeat imaging of lung nodule noted on scan in 01/2021.   PET 04/14/2021: Enlarging hypermetabolic left upper lobe nodule, most compatible with stage I A primary bronchogenic carcinoma.  Trace right pleural fluid.  CT Super D Chest 04/14/2021: 6 mm nodule along the minor fissure (7/84), unchanged from 05/07/2019 and considered benign. Spiculated nodule in the posterior left upper lobe measures 2.0 x 2.1 cm (7/62), stable from 02/16/2021 but significantly increased from remote prior exam of 05/07/2019, at which time it measured 10 x 11 mm. No pleural fluid.  Airway is unremarkable.   No pathologically enlarged mediastinal or axillary lymph nodes. Hilar regions are difficult to definitively evaluate without IV contrast.  CT Chest 02/16/2021: Along a cluster of tree-in-bud nodularity in the left upper lobe, a previous 60 cubic mm nodule currently measures 4200 cubic mm, averaging 20 mm in diameter. Although proximity to the tree-in-bud nodularity raises the possibility that this represents atypical infection such as MAI, the nodule growth is clearly different from the other nodules and raises the possibility of malignancy. Tissue diagnosis is recommended when feasible.  Biopsies of LUL Lung 04/26/2021    Tobacco/Marijuana/Snuff/ETOH use: Former Smoker, quit in 2018  Past/Anticipated interventions by cardiothoracic surgery, if any:  -Not felt to be a good surgical candidate due to other medical conditions.   Past/Anticipated interventions by medical oncology, if any:    Signs/Symptoms Weight changes, if any: Has gained a few pounds over the past month. Respiratory complaints, if any: Has intermittent SOB due to increased activity. Hemoptysis, if any: Denies Hemoptysis and cough. Pain issues, if any:  No  SAFETY ISSUES: Prior radiation? No Pacemaker/ICD?  Yes, Dr. Curt Bears monitors pacer, Dr. Claiborne Billings  Cardiologist. Possible current pregnancy? N/a Is the patient on methotrexate? No  Current Complaints / other details:   -

## 2021-05-18 ENCOUNTER — Other Ambulatory Visit: Payer: Self-pay

## 2021-05-18 ENCOUNTER — Encounter: Payer: Self-pay | Admitting: Podiatry

## 2021-05-18 ENCOUNTER — Inpatient Hospital Stay: Payer: Medicare Other | Admitting: Nurse Practitioner

## 2021-05-18 ENCOUNTER — Encounter: Payer: Self-pay | Admitting: Radiation Oncology

## 2021-05-18 ENCOUNTER — Ambulatory Visit
Admission: RE | Admit: 2021-05-18 | Discharge: 2021-05-18 | Disposition: A | Discharge status: Home / Self Care | Payer: Medicare Other | Source: Ambulatory Visit | Attending: Radiation Oncology | Admitting: Radiation Oncology

## 2021-05-18 VITALS — Ht 72.0 in | Wt 212.0 lb

## 2021-05-18 DIAGNOSIS — Z87891 Personal history of nicotine dependence: Secondary | ICD-10-CM | POA: Diagnosis not present

## 2021-05-18 DIAGNOSIS — C3412 Malignant neoplasm of upper lobe, left bronchus or lung: Secondary | ICD-10-CM | POA: Diagnosis not present

## 2021-05-18 NOTE — Progress Notes (Signed)
Subjective:  Patient ID: John [name], male    DOB: 01-16-1951,  MRN: [id number]  John [name] presents to clinic today for at risk foot care. Patient has h/o amputation of digital amputation L 2nd toe and callus(es) bilaterally and painful thick toenails that are difficult to trim. Painful toenails interfere with ambulation. Aggravating factors include wearing enclosed shoe gear. Pain is relieved with periodic professional debridement. Painful calluses are aggravated when weightbearing with and without shoegear. Pain is relieved with periodic professional debridement.  Patient states blood glucose was 165 mg/dl on last night.  Patient did not check blood glucose today.  Patient states he has had an MI and stroke in August.  He also states he now wears his AFO which he has had for years.   PCP is [name], MD , and last visit was 03/03/2021.  Allergies  Allergen Reactions   Omega-3 Fatty Acids Hives and Itching   Benazepril Other (See Comments)    hyperkalemia   Fish Allergy Itching    Review of Systems: Negative except as noted in the HPI. Objective:   Constitutional John [name] is a pleasant 70 y.o. Caucasian male, obese in NAD. AAO x 3.   Vascular CFT <3 seconds b/l LE. Palpable DP/PT pulses b/l LE. Digital hair sparse b/l. Skin temperature gradient WNL b/l. No pain with calf compression b/l. Trace edema noted b/l. No cyanosis or clubbing noted b/l LE.  Neurologic Normal speech. Oriented to person, place, and time. Protective sensation intact 5/5 intact bilaterally with 10g monofilament b/l. Vibratory sensation intact b/l.  Dermatologic Pedal skin is warm and supple b/l LE. No open wounds b/l LE. No interdigital macerations noted b/l LE. Toenails 1-5 right, L hallux, L 3rd toe, L 4th toe, and L 5th toe elongated, discolored, dystrophic, thickened, and crumbly with subungual debris and tenderness to dorsal palpation. Hyperkeratotic lesion(s) submet head 5 right  foot.  No erythema, no edema, no drainage, no fluctuance.  Orthopedic: Wearing AFO on left lower extremity. Muscle strength 5/5 to all LE muscle groups of right lower extremity. Muscle strength 2/5 to all LE muscle groups of left lower extremity. Lower extremity amputation(s): digital amputation L 2nd toe.   Radiographs: None  Last A1c:  Hemoglobin A1C Latest Ref Rng & Units 04/18/2021 04/17/2021 02/17/2021 02/08/2021 01/26/2021  HGBA1C 4.8 - 5.6 % 6.9(H) 6.8(H) 7.6(H) 8.0(H) 7.8(H)  Some recent data might be hidden   Assessment:   1. Onychomycosis   2. Callus   3. Status post amputation of lesser toe, left ([place])   4. Foot drop, left foot   5. DM type 2, goal HbA1c < 7% ([facility])    Plan:  Patient was evaluated and treated and all questions answered. Consent given for treatment as described below: -Examined patient. -Patient evaluated and sent to [facility] today for casting for new diabetic shoes and custom insoles as he is s/p amputation of left 2nd digit. -Continue diabetic foot care principles: inspect feet daily, monitor glucose as recommended by PCP and/or Endocrinologist, and follow prescribed diet per PCP, Endocrinologist and/or dietician. -Patient to continue soft, supportive shoe gear daily. Start procedure for diabetic shoes. Patient qualifies based on diagnoses. -Mycotic toenails 1-5 right, L hallux, L 3rd toe, L 4th toe, and L 5th toe were debrided in length and girth with sterile nail nippers and dremel without iatrogenic bleeding. -Callus(es) submet head 5 right foot pared utilizing sterile scalpel blade without complication or incident. Total number debrided =1. -Patient/POA to call  should there be question/concern in the interim.  Return in about 3 months (around 08/16/2021).  Marzetta Board, DPM

## 2021-05-18 NOTE — Progress Notes (Signed)
Our palliative team saw Mr. Dacy on 11/18 as he had recently been seen by our inpatient palliative team. He was in the process of being worked up for a new lung mass, but had not received his results from Dr. Valeta Harms at this time. He had no symptom management needs from [place] at the time. He needed help with transportation services and we connected him with this resource. We told him we would follow up once he had a clear plan from the oncologists here. Mr. Leverich met with Rad-Onc today to discuss his treatment options.  I spoke with Mr. Felter after this via [place] visit. He stated that he was Stage I and that the plan was to do radiation treatments. He was upbeat and looking forward to beginning his treatment. He denied having pain, chest pain, N/V, constipation/diarrhea. He stated that he does get [facility] every once in a while and that it comes and goes. I told him we would follow up with him after one of his radiation treatments and see him in person. He stated that he has been cleared to drive and that transportation is no longer an issue.  I advised him to call with any questions/concerns/symptoms. I gave him our office number.  Understanding verbalized. All questions answered. Information relayed to [place], NP.

## 2021-05-18 NOTE — Progress Notes (Signed)
Radiation Oncology         [phone] ________________________________  Name: John [name]        MRN: [id number]  Date of Service: 05/18/2021 DOB: 1950-11-01  FU:[id number], [name], MD  Garner Nash, DO     REFERRING PHYSICIAN: Garner Nash, DO   DIAGNOSIS: The encounter diagnosis was Malignant neoplasm of upper lobe of left lung ([place]).   HISTORY OF PRESENT ILLNESS: John [name] is a 70 y.o. male seen at the request of Dr. Valeta Harms for new diagnosis of lung cancer.  The patient was recently hospitalized between 04/17/2021 and 04/29/2021, during his hospitalization he was found to have recurrent ventricular tachycardia.  He had undergone ablation in August 2022 and has an implanted ICD that had been placed in March 2022.  He is followed in the EP clinic, but developed acute left axillary vein DVT and superficial thrombus of the cephalic and basilic veins he was started on Eliquis and treated for cellulitis of his left forearm during his hospitalization.  He was also found by imaging to have a mass in the left upper lobe prior to his admission back in August 2022.  At that time there was a tree-in-bud cluster of nodularity in the left upper lobe measuring up to 2.2 cm.  He met with Dr. Valeta Harms on 10 12/13/2020 he underwent a CT super D CT scan that showed persistent spiculated left upper lobe nodule measuring up to 2.1 cm and PET scan that day showed hypermetabolism with an SUV of 7.9 in the left upper lobe nodule that measured up to 2.3 cm.  A 6 mm nodule along the minor fissure was also noted and has been chronically stable and felt to be benign no evidence of metastatic disease was identified.  He underwent bronchoscopy during his hospitalization on 04/26/2021, and biopsy results from that procedure showed malignant cells consistent with non-small cell lung cancer and the brushings were also consistent with the same.  The immunoprofile was nonspecific but raise the possibility of  adenosquamous features.  Given his other medical conditions he is not felt to be a good candidate for surgical resection and rather is seen to discuss stereotactic body radiotherapy.     PREVIOUS RADIATION THERAPY: No   PAST MEDICAL HISTORY:  Past Medical History:  Diagnosis Date   Atrial flutter ([place])    s/p cardioversion   Coronary artery disease    Diabetes mellitus    GERD (gastroesophageal reflux disease)    History of nuclear stress test 04/04/2011   lexiscan; mod-large in size fixed inferolateral defect (scar); non-diagnostic for ischemia; low risk scan    Hyperlipidemia    Hypertension    Ischemic cardiomyopathy    Left foot drop    r/t past disk srugery - uses Kevlar brace   Lung cancer ([place]) 04/26/2021   Myocardial infarction [facility])    posterior MI   Osteomyelitis ([place])    s/p left 2nd toe amputation in 01/2021   Pulmonary nodule    Recurrent ventricular tachycardia    Shortness of breath    Sleep apnea    on CPAP; 04/28/2007 split-night - AHI during total sleep 44.43/hr and REM 72.56/hr       PAST SURGICAL HISTORY: Past Surgical History:  Procedure Laterality Date   AMPUTATION TOE Left 02/10/2021   Procedure: AMPUTATION  LEFT SECOND TOE;  Surgeon: Edrick Kins, DPM;  Location: [place];  Service: Podiatry;  Laterality: Left;   [place]  BRONCHIAL BIOPSY  04/26/2021   Procedure: BRONCHIAL BIOPSIES;  Surgeon: Garner Nash, DO;  Location: [place] [facility];  Service: Pulmonary;;   BRONCHIAL BRUSHINGS  04/26/2021   Procedure: BRONCHIAL BRUSHINGS;  Surgeon: Garner Nash, DO;  Location: [place] [facility];  Service: Pulmonary;;   BRONCHIAL NEEDLE ASPIRATION BIOPSY  04/26/2021   Procedure: BRONCHIAL NEEDLE ASPIRATION BIOPSIES;  Surgeon: Garner Nash, DO;  Location: [place];  Service: Pulmonary;;   CARDIAC CATHETERIZATION  2010   6 stents total   CARDIAC CATHETERIZATION  01/2000   percutaneous transluminal coronary balloon angioplasty of mid RCA stenotic  lesion   CARDIAC CATHETERIZATION  06/2006   no stenting; ischemic cardiomyopathy, EF 40-45%   CARDIOVERSION N/A 07/28/2016   Procedure: CARDIOVERSION;  Surgeon: Troy Sine, MD;  Location: [place];  Service: Cardiovascular;  Laterality: N/A;   CORONARY ANGIOPLASTY  09/1998   mid-distal RCA balloon dilatation, 4.5 & 5.0 stents    CORONARY ANGIOPLASTY WITH STENT PLACEMENT  03/1994   angioplasty & stenting (non-DES) of circumflex/prox ramus intermedius   CORONARY ANGIOPLASTY WITH STENT PLACEMENT  10/1994   large iliac [id number] stent to RCA   [place]  12/2002   4.40m stents x2 of RCA   CORONARY ANGIOPLASTY WITH STENT PLACEMENT  01/2005   cutting balloon arthrectomy of distal RCA & Cypher DES 3.5x13; cutting balloon arthrectomy of mid RCA with Cypher DES 3.5x18   CORONARY ANGIOPLASTY WITH STENT PLACEMENT  11/2008   stenting of mid RCA with 4.0x134mdriver, non-DES   CORONARY BALLOON ANGIOPLASTY N/A 02/15/2021   Procedure: COCamp Surgeon: KeTroy SineMD;  [facility];  Service: Cardiovascular;  Laterality: N/A;   FIDUCIAL MARKER PLACEMENT  04/26/2021   Procedure: FIDUCIAL MARKER PLACEMENT;  Surgeon: IcGarner NashDO;  [facility];  Service: Pulmonary;;   ICD IMPLANT N/A 08/20/2020   Procedure: ICD IMPLANT;  Surgeon: CaConstance HawMD;  [facility];  Service: Cardiovascular;  Laterality: N/A;   INTRAVASCULAR PRESSURE WIRE/FFR STUDY N/A 03/02/2020   Procedure: INTRAVASCULAR PRESSURE WIRE/FFR STUDY;  Surgeon: HaLeonie ManMD;  [facility];  Service: Cardiovascular;  Laterality: N/A;   LEFT HEART CATH AND CORONARY ANGIOGRAPHY N/A 03/02/2020   Procedure: LEFT HEART CATH AND CORONARY ANGIOGRAPHY;  Surgeon: HaLeonie ManMD;  [facility];  Service: Cardiovascular;  Laterality: N/A;   LEFT HEART CATH AND CORONARY ANGIOGRAPHY N/A 08/19/2020   Procedure:  LEFT HEART CATH AND CORONARY ANGIOGRAPHY;  Surgeon: BeLorretta HarpMD;  [facility];  Service: Cardiovascular;  Laterality: N/A;   LEFT HEART CATH AND CORONARY ANGIOGRAPHY N/A 02/15/2021   Procedure: LEFT HEART CATH AND CORONARY ANGIOGRAPHY;  Surgeon: KeTroy SineMD;  [facility];  Service: Cardiovascular;  Laterality: N/A;   LEFT HEART CATHETERIZATION WITH CORONARY ANGIOGRAM N/A 02/27/2012   Procedure: LEFT HEART CATHETERIZATION WITH CORONARY ANGIOGRAM;  Surgeon: JoLorretta HarpMD;  [facility];  Service: Cardiovascular;  Laterality: N/A;   TRANSTHORACIC ECHOCARDIOGRAM  07/29/2010   EF 50=55%, mod inf wall hypokinesis & mild post wall hypokinesis; LA mild-mod dilated; mild mitral annular calcif & mild MR; mild TR & elevated RV systolic pressure; AV mildly sclerotic; mild aortic root dilatation    V TACH ABLATION N/A 01/20/2021   Procedure: V TACH ABLATION;  Surgeon: LaVickie EpleyMD;  [facility];  Service:  Cardiovascular;  Laterality: N/A;   VIDEO BRONCHOSCOPY WITH ENDOBRONCHIAL NAVIGATION Left 04/26/2021   Procedure: VIDEO BRONCHOSCOPY WITH ENDOBRONCHIAL NAVIGATION;  Surgeon: Garner Nash, DO;  Location: [place];  Service: Pulmonary;  Laterality: Left;  [place] w/ fiducial   VIDEO BRONCHOSCOPY WITH RADIAL ENDOBRONCHIAL ULTRASOUND  04/26/2021   Procedure: RADIAL ENDOBRONCHIAL ULTRASOUND;  Surgeon: Garner Nash, DO;  [facility];  Service: Pulmonary;;     FAMILY HISTORY:  Family History  Problem Relation Age of Onset   Bone cancer Mother    Heart attack Father      SOCIAL HISTORY:  reports that he quit smoking about 4 years ago. His smoking use included cigarettes. He has a 50.00 pack-year smoking history. He has never used smokeless tobacco. He reports that he does not currently use alcohol. He reports that he does not use drugs. The patient is retired and he lives in [place]  [place]. He and his wife enjoy spending time with their family, especially grand children. 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   ALLERGIES: Omega-3 fatty acids, Benazepril, and Fish allergy   MEDICATIONS:  Current Outpatient Medications  Medication Sig Dispense Refill   albuterol (VENTOLIN HFA) 108 (90 Base) MCG/ACT inhaler Inhale 2 puffs into the lungs every 6 (six) hours as needed for wheezing or shortness of breath. 1 each 6   amiodarone (PACERONE) 400 MG tablet Take 1 tablet (400 mg total) by mouth 2 (two) times daily. 60 tablet 1   apixaban (ELIQUIS) 5 MG TABS tablet Take 2 tablets (10 mg total) by mouth 2 (two) times daily for 7 days, THEN 1 tablet (5 mg total) 2 (two) times daily for 23 days. 74 tablet 0   clopidogrel (PLAVIX) 75 MG tablet Take 1 tablet (75 mg total) by mouth daily with breakfast.     ENTRESTO 24-26 MG TAKE 1 TABLET BY MOUTH TWICE A DAY 60 tablet 6   Fluticasone Furoate (ARNUITY ELLIPTA) 200 MCG/ACT AEPB Inhale 1 puff into the lungs daily. 30 each 6   insulin aspart (NOVOLOG FLEXPEN) 100 UNIT/ML FlexPen INJECT 5-20 UNITS SUBCUTANEOUSLY WITH LUNCH AND DINNER (Patient taking differently: Inject 6-8 Units into the skin daily as needed for high blood sugar.) 15 mL 1   Insulin Pen Needle (NOVOFINE) 32G X 6 MM MISC 1 each by Other route daily. 100 each 3   JARDIANCE 25 MG TABS tablet TAKE 1 TABLET BY MOUTH EVERY DAY 30 tablet 5   LEVEMIR FLEXTOUCH 100 UNIT/ML FlexTouch Pen INJECT 46 UNITS INTO THE SKIN DAILY. DX:E11.9 15 mL 3   metoprolol tartrate (LOPRESSOR) 50 MG tablet Take 1 tablet (50 mg total) by mouth 2 (two) times daily. 60 tablet 5   mexiletine (MEXITIL) 150 MG capsule Take 2 capsules (300 mg total) by mouth every 12 (twelve) hours. 120 capsule 5   montelukast (SINGULAIR) 10 MG tablet TAKE 1 TABLET BY MOUTH EVERY DAY (Patient taking differently: Take 10 mg by  mouth daily.) 90 tablet 3   nitroGLYCERIN (NITROSTAT) 0.4 MG SL tablet PLACE 1 TABLET (0.4 MG TOTAL) UNDER THE TONGUE EVERY 5 (FIVE) MINUTES AS NEEDED FOR CHEST PAIN. 25 tablet 1   ONETOUCH ULTRA test strip USE TO CHECK BLOOD SUGAR 3 TIMES A DAY (E11.9) 100 strip 2   rosuvastatin (CRESTOR) 40 MG tablet Take 1 tablet (40 mg total) by mouth daily. 30 tablet 0  spironolactone (ALDACTONE) 25 MG tablet Take 0.5 tablets (12.5 mg total) by mouth daily. 15 tablet 0   No current facility-administered medications for this encounter.     REVIEW OF SYSTEMS: On review of systems, the patient reports that he is doing pretty well overall despite his cardiac and vascular disease. He reports that he has not had any fevers or unintended weight loss. He does have intermittent episodes of shortness of breath with exertion. He denies any cough or hemoptysis, and is not having any pain. No other complaints are verbalized.      PHYSICAL EXAM:  Wt Readings from Last 3 Encounters:  05/18/21 212 lb (96.2 kg)  05/06/21 211 lb 9.6 oz (96 kg)  05/05/21 213 lb 8 oz (96.8 kg)   Temp Readings from Last 3 Encounters:  05/06/21 (!) 97.5 F (36.4 C) (Oral)  05/05/21 (!) 97.3 F (36.3 C)  04/29/21 (!) 97.3 F (36.3 C) (Oral)   BP Readings from Last 3 Encounters:  05/06/21 (!) 131/98  05/05/21 120/90  05/05/21 130/90   Pulse Readings from Last 3 Encounters:  05/06/21 86  05/05/21 81  05/05/21 83   Pain Assessment Pain Score: 0-No pain/10  In general this is a well appearing caucasian male in no acute distress. He's alert and oriented x4 and appropriate throughout the examination. Cardiopulmonary assessment is negative for acute distress and he exhibits normal effort.     ECOG = 1  0 - Asymptomatic (Fully active, able to carry on all predisease activities without restriction)  1 - Symptomatic but completely ambulatory (Restricted in physically strenuous activity but ambulatory and able to carry out work  of a light or sedentary nature. For example, light housework, office work)  2 - Symptomatic, <50% in bed during the day (Ambulatory and capable of all self care but unable to carry out any work activities. Up and about more than 50% of waking hours)  3 - Symptomatic, >50% in bed, but not bedbound (Capable of only limited self-care, confined to bed or chair 50% or more of waking hours)  4 - Bedbound (Completely disabled. Cannot carry on any self-care. Totally confined to bed or chair)  5 - Death   Eustace Pen MM, Creech RH, Tormey DC, et al. [phone]). "Toxicity and response criteria of the [facility]". [place] Oncol. 5 (6): 649-55    LABORATORY DATA:  Lab Results  Component Value Date   WBC 8.7 05/05/2021   HGB 17.5 05/05/2021   HCT 52.6 (H) 05/05/2021   MCV 90 05/05/2021   PLT 254 05/05/2021   Lab Results  Component Value Date   NA 147 (H) 05/05/2021   K 4.6 05/05/2021   CL 108 (H) 05/05/2021   CO2 22 05/05/2021   Lab Results  Component Value Date   ALT 13 04/18/2021   AST 15 04/18/2021   ALKPHOS 71 04/18/2021   BILITOT 0.5 04/18/2021      RADIOGRAPHY: DG CHEST PORT 1 VIEW  Result Date: 04/26/2021 CLINICAL DATA:  Status post bronchoscopy and biopsy EXAM: PORTABLE CHEST 1 VIEW COMPARISON:  04/25/2021 FINDINGS: Left chest cardiac device with leads in the right atrium and ventricle. Unchanged cardiac and mediastinal contours. A surgical clip now projects over the left upper lobe, with any residual nodule possibly obscured by the cardiac device. No focal pulmonary opacity. No pleural effusion or pneumothorax. No acute osseous abnormality. IMPRESSION: No pneumothorax seen, status post bronchoscopy and biopsy. Electronically Signed   By: Francetta Found.D.  On: 04/26/2021 16:03   DG Chest Port 1 View  Result Date: 04/25/2021 CLINICAL DATA:  70 year old male with enlarging left upper lobe pulmonary nodule EXAM: PORTABLE CHEST 1 VIEW COMPARISON:  Prior chest  x-ray 04/17/2021 FINDINGS: Left subclavian approach cardiac rhythm maintenance device. Leads project over the right atrium and right ventricle. Cardiac and mediastinal contours are within normal limits. Subtle nodular opacity projects over the left mid lung partially obscured by the generator of the cardiac rhythm maintenance device. The nodule measures approximately 2.5 cm. No pneumothorax or pleural effusion. No acute osseous abnormality. IMPRESSION: 1. No active disease. 2. Known left upper lobe pulmonary nodule partially obscured by the overlying generator from the cardiac rhythm maintenance device. The nodule measures approximately 2.5 cm. Electronically Signed   By: Jacqulynn Cadet M.D.   On: 04/25/2021 08:07   VAS [place] UPPER EXTREMITY VENOUS DUPLEX  Result Date: 04/29/2021 UPPER VENOUS STUDY  Patient Name:  SHANON BECVAR  Date of Exam:   04/29/2021 Medical Rec #: [id number]         Accession #:    [id number] Date of Birth: May 20, 1951         Patient Gender: M Patient Age:   75 years Exam Location:  [facility] Procedure:      VAS [place] UPPER EXTREMITY VENOUS DUPLEX Referring Phys: Vance Gather --------------------------------------------------------------------------------  Indications: Pain, Swelling, and Erythema Comparison Study: No prior studies. Performing Technologist: Darlin Coco RDMS, RVT  Examination Guidelines: A complete evaluation includes B-mode imaging, spectral Doppler, color Doppler, and power Doppler as needed of all accessible portions of each vessel. Bilateral testing is considered an integral part of a complete examination. Limited examinations for reoccurring indications may be performed as noted.  Right Findings: +----------+------------+---------+-----------+----------+-------+ RIGHT     CompressiblePhasicitySpontaneousPropertiesSummary +----------+------------+---------+-----------+----------+-------+ Subclavian    Full       Yes       Yes                       +----------+------------+---------+-----------+----------+-------+  Left Findings: +----------+------------+---------+-----------+----------+-------+ LEFT      CompressiblePhasicitySpontaneousPropertiesSummary +----------+------------+---------+-----------+----------+-------+ IJV           Full       Yes       Yes                      +----------+------------+---------+-----------+----------+-------+ Subclavian    Full       Yes       Yes                      +----------+------------+---------+-----------+----------+-------+ Axillary      None       No        No                Acute  +----------+------------+---------+-----------+----------+-------+ Brachial      Full                                          +----------+------------+---------+-----------+----------+-------+ Radial        Full                                          +----------+------------+---------+-----------+----------+-------+ Ulnar         Full                                          +----------+------------+---------+-----------+----------+-------+  Cephalic    Partial      Yes       Yes               Acute  +----------+------------+---------+-----------+----------+-------+ Basilic       None       No        No                Acute  +----------+------------+---------+-----------+----------+-------+  Summary:  Right: No evidence of thrombosis in the subclavian.  Left: Findings consistent with acute deep vein thrombosis involving the left axillary vein. Findings consistent with acute superficial vein thrombosis involving the left basilic vein and left cephalic vein.  *See table(s) above for measurements and observations.  Diagnosing physician: Servando Snare MD Electronically signed by Servando Snare MD on 04/29/2021 at 4:27:31 PM.    Final    DG C-ARM BRONCHOSCOPY  Result Date: 04/26/2021 C-ARM BRONCHOSCOPY: Fluoroscopy was utilized by the requesting physician.  No radiographic  interpretation.       IMPRESSION/PLAN: 1. Stage IA3, cT1cN0M0, NSCLC, favor adenosquamous carcinoma of the LUL. Dr. Lisbeth Renshaw discusses the pathology findings and reviews the nature of early stage lung cancer. We reviewed that while surgical resection remains the gold standard of treatment, that for patients who are not medically candidates for lobectomy, or who choose to forgo surgery, that stereotactic body radiotherapy (SBRT) is an acceptable alternative.  We discussed the risks, benefits, short, and long term effects of radiotherapy, as well as the curative intent, and the patient is interested in proceeding with radiation to treat his cancer. Dr. Lisbeth Renshaw discusses the delivery and logistics of radiotherapy and anticipates a course of 3-5 fractions to the tumor in the posterior LUL. He will be contacted by our simulation staff to coordinate simulation at which time he will sign written consent to proceed. 2. In Situ ICD. We will reach out to Dr. Claiborne Billings and his EP cardiologist Dr. Curt Bears as well for their permission to treat Mr. Mancusi with radiation. I believe the tumor is 8-10 cm from the location of his left chest ICD.  3. Cardiovascular disease. He is planning cardiac rehabilitation which we would encourage as well, and will defer further management to his cardiac and primary care providers.  This encounter was provided by telemedicine platform MyChart.  The patient has provided two factor identification and has given verbal consent for this type of encounter and has been advised to only accept a meeting of this type in a secure network environment. The time spent during this encounter was 60 minutes including preparation, discussion, and coordination of the patient's care. The attendants for this meeting include Blenda Nicely, RN, Dr. Lisbeth Renshaw, [name] Pedro  and [name] and his wife Faraaz Wolin. During the encounter,  Blenda Nicely, RN, Dr. Lisbeth Renshaw, and [name] Pedro were  located at [facility] Radiation Oncology Department.  ARDEAN MELROY was located at home with his wife [name].    The above documentation reflects my direct findings during this shared patient visit. Please see the separate note by Dr. Lisbeth Renshaw on this date for the remainder of the patient's plan of care.    Carola Rhine, [facility]   **Disclaimer: This note was dictated with voice recognition software. Similar sounding words can inadvertently be transcribed and this note may contain transcription errors which may not have been corrected upon publication of note.**

## 2021-05-19 ENCOUNTER — Ambulatory Visit (INDEPENDENT_AMBULATORY_CARE_PROVIDER_SITE_OTHER): Payer: Medicare Other | Admitting: Family Medicine

## 2021-05-19 ENCOUNTER — Encounter: Payer: Self-pay | Admitting: Family Medicine

## 2021-05-19 ENCOUNTER — Telehealth: Payer: Self-pay | Admitting: Nurse Practitioner

## 2021-05-19 ENCOUNTER — Other Ambulatory Visit: Payer: Self-pay | Admitting: *Deleted

## 2021-05-19 VITALS — BP 122/78 | HR 76 | Resp 18 | Ht 72.0 in | Wt 211.0 lb

## 2021-05-19 DIAGNOSIS — I2510 Atherosclerotic heart disease of native coronary artery without angina pectoris: Secondary | ICD-10-CM | POA: Diagnosis not present

## 2021-05-19 DIAGNOSIS — I82622 Acute embolism and thrombosis of deep veins of left upper extremity: Secondary | ICD-10-CM | POA: Diagnosis not present

## 2021-05-19 NOTE — Progress Notes (Signed)
The proposed treatment discussed in cancer conference is for discussion purpose only and is not a binding recommendation. The patient was not physically examined nor present for their treatment options. Therefore, final treatment plans cannot be decided.  ?

## 2021-05-19 NOTE — Telephone Encounter (Signed)
I called Mr. Shirlee Latch for initial pc consult, no answer, message left with contact information,

## 2021-05-19 NOTE — Progress Notes (Signed)
Subjective:    Patient ID: John [name], male    DOB: 11/16/1950, 70 y.o.   MRN: [id number]  Admit date: 02/24/2021 Discharge date: 02/26/2021   PCP:  [name], MD              [facility] Prov[facility] Cardiologist:  Shelva Majestic, MD  Electrophysiologist:  Constance Haw, MD  {   Discharge Diagnoses    Principal Problem:   Ventricular tachycardia [facility]) Active Problems:   CAD, multiple prior RCA PCI's. Last cath 2010, Myoview low risk Oct 2012   DM type 2, goal HbA1c < 7% (HCC)   HTN (hypertension)   COPD (chronic obstructive pulmonary disease) (HCC)   GERD (gastroesophageal reflux disease)   OSA on CPAP   Hyperlipidemia LDL goal <70   Anticoagulation adequate   Thoracic aortic aneurysm (HCC)   Chronic systolic CHF (congestive heart failure) ([place])   Embolic stroke [facility])       Diagnostic Studies/Procedures    02/15/21: LHC/PCI   1st Mrg lesion is 55% stenosed.   Prox RCA lesion is 50% stenosed.   Prox RCA to Dist RCA lesion is 35% stenosed.   RPAV-1 lesion is 100% stenosed.   RPAV-2 lesion is 80% stenosed.   A stent was successfully placed.   Post intervention, there is a 0% residual stenosis.   Post intervention, there is a 0% residual stenosis.   Acute coronary syndrome secondary to total occlusion of a large distal right coronary artery at a site of prior distal stenting.   The LAD has mild irregularity without significant disease.  The large ramus intermediate vessel has previously noted 55% very proximal stenosis not significantly changed.  The AV groove circumflex is a small vessel.   The RCA is a very large vessel with diffuse stents proximally to very distally.  There is proximal 50% stenosis prior to the proximal to mid stent.  There is diffuse 35% narrowing within the long proximal stent.  The distal stent is totally occluded just after PDA vessel.   Difficult but successful PCI requiring PTCA, a 3.5 x 15 mm Scorflex multiple dilatations  throughout the previously placed tandem 3.5 mm  distal stents with ultimate noncompliant balloon dilatation to approximately 3.6 mm and insertion of a 2.75 x 22 mm Onyx Frontier new stent distal to the distal stent placed in overlap 75 to 80% stenosis beyond the stented segment, postdilated with a 3 oh balloon with residual narrowing of 0%.   LVEDP 18 mmHg   RECOMMENDATION: Initial triple drug therapy with plan discontinuance of aspirin and continuation of Plavix/for minimum of 6 to 12 months.  Near the completion of the procedure, the patient appeared slightly less responsive.  Blood sugar was obtained which was 63.  He was given one half amp of D50.  After the procedure, a subtle left facial droop developed and there appeared to be  left arm weakness..  A code stroke was activated and the patient was seen immediately by neurology and sent for head CT for further evaluation.     02/15/21: TTE IMPRESSIONS   1. Left ventricular ejection fraction, by estimation, is 40 to 45%. The  left ventricle has mildly decreased function. The left ventricle  demonstrates regional wall motion abnormalities (see scoring  diagram/findings for description). The left ventricular   internal cavity size was moderately dilated. Left ventricular diastolic  parameters are consistent with Grade I diastolic dysfunction (impaired  relaxation).   2. Right ventricular systolic function  is normal. The right ventricular  size is normal. There is normal pulmonary artery systolic pressure.   3. Left atrial size was mildly dilated.   4. The mitral valve is normal in structure. Trivial mitral valve  regurgitation. No evidence of mitral stenosis.   5. The aortic valve is normal in structure. Aortic valve regurgitation is  not visualized.   6. Aortic dilatation noted. There is borderline dilatation of the aortic  root, measuring 38 mm.   Comparison(s): Prior images reviewed side by side. The left ventricular  function is  unchanged. The left ventricular wall motion abnormalities are  worse, although the general distribution is similar. There seems to be  more some worsening of contractility  in the more distal portion of the inferior and inferoseptal walls, with  sparing of the true apex and without meaningful change in overall LVEF.   _____________   History of Present Illness     John [name] is a 70 y.o. male with a hx of CAD(multiple prior PCIs reported in the RCA and Cx, last stenting 02/15/21), ICM, HTN, HLD, DM, OSA (w/CPAP), TAA, ILD, quit smoking 2019, AFlutter, back surgery resulting in L foot drop, chronic back pain, VT, who is being seen 02/24/2021 for the evaluation of VT requested by Dr. Vivia Ewing.   Device information MDT dual chamber ICD implanted 08/20/20 Secondary prevention w/hx of hemodynamically unstable MMVT   AAD hx 2018 Pulmonary function studies have shown restriction with low DLCO.  He felt possibly also to have asthma based on nitric oxide testing.  With his lung disease it has been recommended that amiodarone be considered for discontinuance Looks like amio was stopped in 2019 Follows with pulmonology March 2022 mexilletine > recurrent VT higher doses were too expensive June 2022 sotalol started Aug 2022 more VT and mexiletine resumed 01/18/21, VT ablation 02/09/21, device interrogation done for MRI noted VT treated with ATP and mexiletine ordered for increase to 300mg  BID >>>> THIS DID NOT GET TRANSLATED ON DISCHARGE AND remained at 153ms   John [name] has had a long year with recurrent VT and medical issues. He was Hospitalized March, June, July, and Aug 2022 with VT, AAD added and escalated as above.  He was admitted 02/11/21 with L great toe cellulitis and underwent amputation.   Readmitted 02/14/21 with CP, NSTEMI, underwent cath and PCI  (Difficult for successful PTCA/PCI using 3.5 mm ScoreFlex balloon multiple inflations with post dilation of the previously stented) overlapping  Onyx Frontier DES 2.75 mm x 22 mm placed overlapping distally); prox RCA ~50%, prox-distal RCA 35%.  Unfortunately suffered a stroke and also found with a lung nodule.   Finally discharged to rehab 02/18/21     On 02/24/21 in CIR, he woke feeling great, though noted some light palpitations.  No other cardiac awareness.  No CP.  He felt unusually weak with rehab, and staff found his HR elevated. EKG was done noting [facility] and cardiology called to see.   He was in bed, feeling well.  No cardiac awareness, denies any kind of CP, and has not had any CP.  No near syncope or syncope. BP was 101/66, HR 130's   The patient was seen with Dr. Sallyanne Kuster, and via programmer confirmed to be in VT.  His detection rate at 140, noted he had 2 ATPs that were successful overnight last night.   Detection rate was reduced to allow device to run through the ATP cycles, though did not work, given asymptomatic and hemodynamically stable,  did not allow HV therapies.   Dr. Sallyanne Kuster was able to break with manual ATP's the last that worked was 394ms.   Note that he was only getting 150mg  BID   He feels well, says his stroke symptoms are nearly resolved, vision is almost back to normal and reports that he was going to be released to "room privileges" today, meaning he could get OOB on his own apparently.   Hospital Course     Consultants: EP   VT Essentially asymptomatic. VT was terminated by manual ATPs by Dr. Sallyanne Kuster. [name] was increase back to 300 mg BID as was initially planned. Apparently, [name] was not restarted at BID dosing after toe amputation surgery, but was discharged to CIR with BID dosing at 300 mg No changes made to his therapy programming but monitor zones was reduced to 120 bpm. Sotalol was continued.    Device Interrogation: Device was interrogated and reprogrammed to incorporate a VT zone down to 120 bpm to allow for ATP for his slow VT.  With his desire that he not shocked for slow VT; hence, no  shocks are in the VT zone     Atrial flutter Chronic anticoagulation - s/p flutter ablation - continue sotalol, mexiletine, and eliquis     CAD - recent PCI treated with difficult but successful DES x 1 to 100% occlusion of RPA V/PL system on 02/15/21 - continue 30 days of triple therapy with ASA, plavix, and eliquis, then stop ASA - complicated by periprocedural CVA --> discharged to CIR     Ischemic cardiomyopathy ICD in place - continue GDMT with BB, entrestor, and jardiance     Lung nodule Plan for biopsy after ASA has been stopped, in 4-6 weeks. This will require readmission for heparin drip as a bridge, given recent stent. This will need to be coordinated with cardiology.  With enlarging nodule - will likely need biopsy for definitive Dx - unfortunately, he has just suffered 2 major events (MI - physiologically a STEMI although technically only NSTEMI by EKG criteria & Per-Procedural CVA with impressive improvement).  He is currently on Triple AC/Antiplatelet Rx with plans to continue at least 1 month -- then plan to d/c ASA & continue clopidogrel & apixiban. For Bx, would need to hold both agents  -  Apixiban x 48 hr & clopidogrel x 5 days (minimum) -- given recent MI & DES PCI - would be best if we could bridge with GP 2b3a Inhibitor while off of Clopidogrel & IV Heparin vs. Enoxaparin while of of apixiban. -- will discuss options with [facility] Pharm team to determine best COA- but would prefer to wait at least 1 month (esp due to CVA     Pt was seen and examined by Dr Caryl Comes and deemed stable for discharge.     03/03/21 CT of lung showed: IMPRESSION: 1. Along a cluster of tree-in-bud nodularity in the left upper lobe, a previous 60 cubic mm nodule currently measures 4200 cubic mm, averaging 20 mm in diameter. Although proximity to the tree-in-bud nodularity raises the possibility that this represents atypical infection such as MAI, the nodule growth is clearly different  from the other nodules and raises the possibility of malignancy. Tissue diagnosis is recommended when feasible.   He is here today for follow-up.  He has an appointment to see his pulmonologist in 1 month.  He follows up regularly with Dr. Valeta Harms.  Therefore he would like to discuss the recommended biopsy at that appointment.  I  believe this is appropriate because cardiology requested that he continue his anticoagulation for at least 1 month due to his recent CVA.  He has a very complicated medical history.  He has been admitted to the hospital several times over the last 6 months.  Recently underwent stenting due to a non-ST elevation myocardial infarction.  Subsequently was admitted to the hospital with ventricular tachycardia and his mexiletine was increased to 300 mg p.o. twice daily in an effort to try to prevent recurrent ventricular tachycardia.  He also recently suffered an amputation of his second right toe performed by podiatry while he was in the hospital.  He denies any further palpitations since discharge from the hospital.  He denies any syncope.  He denies any lightheadedness.  He is wearing an eye patch over his left eye due to diplopia stemming from his stroke as his extraocular movements are now no longer coordinated.  Overall however his attitude is extremely positive.  He seems to be dealing with everything quite well.  His recent A1c was elevated.  His A1c has been ranging between 7 and 8 over the last several months.  He is currently on 45 units of Levemir once daily and then occasional short acting insulin.  He states that his fasting blood sugars are typically between 100-111 in the morning after he takes the Levemir at night.  However he states that his 2-hour postprandial sugars are often 180 or higher in the evenings.  Therefore it seems like he would benefit from mealtime insulin in an effort to try to better manage his diabetes.  05/19/21 Patient underwent bronchoscopy with bx on  06/26/20 to evaluate enlarging left upper lobe pulmonary nodule. Cytology LUL needle biopsy was positive for malignant cells consistent with non-small cell carcinoma.  Was also fount to have left arm DVT: Left:  Findings consistent with acute deep vein thrombosis involving the left  axillary  vein. Findings consistent with acute superficial vein thrombosis involving  the  left basilic vein and left cephalic vein.   Discharge summary 04/29/21 outlines this history: John [name] is a 70 year old male with past medical history significant for CHF, CAD, ILD, and VT s/p ablation and recurrent VT who presented on 04/17/2021 for sustained VT requiring cardioversion in the ER.  He was admitted on EP's service for management of his arrhythmia. Cardiac rhythm has improved and patient being loaded with oral amiodarone.     Patient has a rapidly enlarging right upper lobe lung nodule that there was planned biopsy on Tuesday with Dr. Valeta Harms.  Due to a recent drug-eluting stent, patient requires DAPT, so he was bridged with IV antiplatelet therapy for plavix washout prior to bronchoscopy with biopsy which was performed 11/8, though pathology report is still pending at time of discharge. While holding eliquis, the patient's left arm began swelling, becoming tender and an U/S confirmed acute axillary vein DVT in addition to SVTs in basilic and cephalic veins for which treatment dose eliquis is restarted.   Patient presents today continuing to complain of pain in his left arm per tickly over the basilic vein.  It is swollen and tender.  The arm itself however is smaller per the patient's report.  The arm is not red or swollen.  The vein itself is tender which I believe is an indication of the thrombus in the basilic vein.  However he denies any chest pain or pleurisy or shortness of breath.  He has been told that his cancer is  stage I and will likely be treatable with just 3 weeks of radiation therapy.  This has  brightened his spirits and he certainly seems to be in better spirits than when I last saw him.  Otherwise he states he is doing relatively well.  His blood sugars are typically running between 115 and 150.  He seldom has to take correction doses on his rapid insulin.   Past Medical History:  Diagnosis Date   Atrial flutter ([place])    s/p cardioversion   Coronary artery disease    Diabetes mellitus    GERD (gastroesophageal reflux disease)    History of nuclear stress test 04/04/2011   lexiscan; mod-large in size fixed inferolateral defect (scar); non-diagnostic for ischemia; low risk scan    Hyperlipidemia    Hypertension    Ischemic cardiomyopathy    Left foot drop    r/t past disk srugery - uses Kevlar brace   Lung cancer ([place]) 04/26/2021   Myocardial infarction [facility])    posterior MI   Osteomyelitis ([place])    s/p left 2nd toe amputation in 01/2021   Pulmonary nodule    Recurrent ventricular tachycardia    Shortness of breath    Sleep apnea    on CPAP; 04/28/2007 split-night - AHI during total sleep 44.43/hr and REM 72.56/hr   Past Surgical History:  Procedure Laterality Date   AMPUTATION TOE Left 02/10/2021   Procedure: AMPUTATION  LEFT SECOND TOE;  Surgeon: Edrick Kins, DPM;  Location: [place];  Service: Podiatry;  Laterality: Left;   [place]   BRONCHIAL BIOPSY  04/26/2021   Procedure: BRONCHIAL BIOPSIES;  Surgeon: Garner Nash, DO;  Location: [place] ENDOSCOPY;  Service: Pulmonary;;   BRONCHIAL BRUSHINGS  04/26/2021   Procedure: BRONCHIAL BRUSHINGS;  Surgeon: Garner Nash, DO;  Location: [place] [facility];  Service: Pulmonary;;   BRONCHIAL NEEDLE ASPIRATION BIOPSY  04/26/2021   Procedure: BRONCHIAL NEEDLE ASPIRATION BIOPSIES;  Surgeon: Garner Nash, DO;  Location: [place];  Service: Pulmonary;;   CARDIAC CATHETERIZATION  2010   6 stents total   CARDIAC CATHETERIZATION  01/2000   percutaneous transluminal coronary balloon angioplasty of mid RCA stenotic lesion    CARDIAC CATHETERIZATION  06/2006   no stenting; ischemic cardiomyopathy, EF 40-45%   CARDIOVERSION N/A 07/28/2016   Procedure: CARDIOVERSION;  Surgeon: Troy Sine, MD;  Location: [place];  Service: Cardiovascular;  Laterality: N/A;   CORONARY ANGIOPLASTY  09/1998   mid-distal RCA balloon dilatation, 4.5 & 5.0 stents    CORONARY ANGIOPLASTY WITH STENT PLACEMENT  03/1994   angioplasty & stenting (non-DES) of circumflex/prox ramus intermedius   CORONARY ANGIOPLASTY WITH STENT PLACEMENT  10/1994   large iliac [id number] stent to RCA   [place]  12/2002   4.37mm stents x2 of RCA   CORONARY ANGIOPLASTY WITH STENT PLACEMENT  01/2005   cutting balloon arthrectomy of distal RCA & Cypher DES 3.5x13; cutting balloon arthrectomy of mid RCA with Cypher DES 3.5x18   CORONARY ANGIOPLASTY WITH STENT PLACEMENT  11/2008   stenting of mid RCA with 4.0x5mm driver, non-DES   CORONARY BALLOON ANGIOPLASTY N/A 02/15/2021   Procedure: CORONARY BALLOON ANGIOPLASTY;  Surgeon: Troy Sine, MD;  Location: [place] CV LAB;  Service: Cardiovascular;  Laterality: N/A;   FIDUCIAL MARKER PLACEMENT  04/26/2021   Procedure: FIDUCIAL MARKER PLACEMENT;  Surgeon: Garner Nash, DO;  Location: [place];  Service: Pulmonary;;   ICD IMPLANT N/A 08/20/2020   Procedure:  ICD IMPLANT;  Surgeon: Constance Haw, MD;  Location: [place] CV LAB;  Service: Cardiovascular;  Laterality: N/A;   INTRAVASCULAR PRESSURE WIRE/FFR STUDY N/A 03/02/2020   Procedure: INTRAVASCULAR PRESSURE WIRE/FFR STUDY;  Surgeon: Leonie Man, MD;  Location: [place] CV LAB;  Service: Cardiovascular;  Laterality: N/A;   LEFT HEART CATH AND CORONARY ANGIOGRAPHY N/A 03/02/2020   Procedure: LEFT HEART CATH AND CORONARY ANGIOGRAPHY;  Surgeon: Leonie Man, MD;  Location: [place] [facility];  Service: Cardiovascular;  Laterality: N/A;   LEFT HEART CATH AND CORONARY ANGIOGRAPHY N/A 08/19/2020   Procedure: LEFT  HEART CATH AND CORONARY ANGIOGRAPHY;  Surgeon: Lorretta Harp, MD;  Location: [place] CV LAB;  Service: Cardiovascular;  Laterality: N/A;   LEFT HEART CATH AND CORONARY ANGIOGRAPHY N/A 02/15/2021   Procedure: LEFT HEART CATH AND CORONARY ANGIOGRAPHY;  Surgeon: Troy Sine, MD;  Location: [place] CV LAB;  Service: Cardiovascular;  Laterality: N/A;   LEFT HEART CATHETERIZATION WITH CORONARY ANGIOGRAM N/A 02/27/2012   Procedure: LEFT HEART CATHETERIZATION WITH CORONARY ANGIOGRAM;  Surgeon: Lorretta Harp, MD;  Location: [facility];  Service: Cardiovascular;  Laterality: N/A;   TRANSTHORACIC ECHOCARDIOGRAM  07/29/2010   EF 50=55%, mod inf wall hypokinesis & mild post wall hypokinesis; LA mild-mod dilated; mild mitral annular calcif & mild MR; mild TR & elevated RV systolic pressure; AV mildly sclerotic; mild aortic root dilatation    V TACH ABLATION N/A 01/20/2021   Procedure: V TACH ABLATION;  Surgeon: Vickie Epley, MD;  Location: [place] CV LAB;  Service: Cardiovascular;  Laterality: N/A;   VIDEO BRONCHOSCOPY WITH ENDOBRONCHIAL NAVIGATION Left 04/26/2021   Procedure: VIDEO BRONCHOSCOPY WITH ENDOBRONCHIAL NAVIGATION;  Surgeon: Garner Nash, DO;  Location: [place];  Service: Pulmonary;  Laterality: Left;  ION w/ fiducial   VIDEO BRONCHOSCOPY WITH RADIAL ENDOBRONCHIAL ULTRASOUND  04/26/2021   Procedure: RADIAL ENDOBRONCHIAL ULTRASOUND;  Surgeon: Garner Nash, DO;  Location: [place] [facility];  Service: Pulmonary;;   Current Outpatient Medications on File Prior to Visit  Medication Sig Dispense Refill   albuterol (VENTOLIN HFA) 108 (90 Base) MCG/ACT inhaler Inhale 2 puffs into the lungs every 6 (six) hours as needed for wheezing or shortness of breath. 1 each 6   amiodarone (PACERONE) 400 MG tablet Take 1 tablet (400 mg total) by mouth 2 (two) times daily. 60 tablet 1   apixaban (ELIQUIS) 5 MG TABS tablet Take 2 tablets (10 mg total) by mouth 2 (two) times daily for 7 days,  THEN 1 tablet (5 mg total) 2 (two) times daily for 23 days. 74 tablet 0   clopidogrel (PLAVIX) 75 MG tablet Take 1 tablet (75 mg total) by mouth daily with breakfast.     ENTRESTO 24-26 MG TAKE 1 TABLET BY MOUTH TWICE A DAY 60 tablet 6   Fluticasone Furoate (ARNUITY ELLIPTA) 200 MCG/ACT AEPB Inhale 1 puff into the lungs daily. 30 each 6   insulin aspart (NOVOLOG FLEXPEN) 100 UNIT/ML FlexPen INJECT 5-20 UNITS SUBCUTANEOUSLY WITH LUNCH AND DINNER (Patient taking differently: Inject 6-8 Units into the skin daily as needed for high blood sugar.) 15 mL 1   Insulin Pen Needle (NOVOFINE) 32G X 6 MM MISC 1 each by Other route daily. 100 each 3   JARDIANCE 25 MG TABS tablet TAKE 1 TABLET BY MOUTH EVERY DAY 30 tablet 5   LEVEMIR FLEXTOUCH 100 UNIT/ML FlexTouch Pen INJECT 46 UNITS INTO THE SKIN DAILY. DX:E11.9 15 mL 3   metoprolol  tartrate (LOPRESSOR) 50 MG tablet Take 1 tablet (50 mg total) by mouth 2 (two) times daily. 60 tablet 5   mexiletine (MEXITIL) 150 MG capsule Take 2 capsules (300 mg total) by mouth every 12 (twelve) hours. 120 capsule 5   montelukast (SINGULAIR) 10 MG tablet TAKE 1 TABLET BY MOUTH EVERY DAY (Patient taking differently: Take 10 mg by mouth daily.) 90 tablet 3   nitroGLYCERIN (NITROSTAT) 0.4 MG SL tablet PLACE 1 TABLET (0.4 MG TOTAL) UNDER THE TONGUE EVERY 5 (FIVE) MINUTES AS NEEDED FOR CHEST PAIN. 25 tablet 1   ONETOUCH ULTRA test strip USE TO CHECK BLOOD SUGAR 3 TIMES A DAY (E11.9) 100 strip 2   rosuvastatin (CRESTOR) 40 MG tablet Take 1 tablet (40 mg total) by mouth daily. 30 tablet 0   spironolactone (ALDACTONE) 25 MG tablet Take 0.5 tablets (12.5 mg total) by mouth daily. 15 tablet 0   No current facility-administered medications on file prior to visit.   Allergies  Allergen Reactions   Omega-3 Fatty Acids Hives and Itching   Benazepril Other (See Comments)    hyperkalemia   Fish Allergy Itching   Social History   Socioeconomic History   Marital status: Married     Spouse name: Not on file   Number of children: 3   Years of education: Not on file   Highest education level: Not on file  Occupational History   Occupation: Best boy: [employer]    Comment: [place], [place]. [place]  Tobacco Use   Smoking status: Former    Packs/day: 1.00    Years: 50.00    Pack years: 50.00    Types: Cigarettes    Quit date: 07/20/2016    Years since quitting: 4.8   Smokeless tobacco: Never  Vaping Use   Vaping Use: Never used  Substance and Sexual Activity   Alcohol use: Not Currently    Alcohol/week: 0.0 standard drinks   Drug use: No   Sexual activity: Yes  Other Topics Concern   Not on file  Social History Narrative   Not on file   Social Determinants of Health   Financial Resource Strain: Not on file  Food Insecurity: Not on file  Transportation Needs: Not on file  Physical Activity: Not on file  Stress: Not on file  Social Connections: Not on file  Intimate Partner Violence: Not At Risk   Fear of Current or Ex-Partner: No   Emotionally Abused: No   Physically Abused: No   Sexually Abused: No      Review of Systems  All other systems reviewed and are negative.     Objective:   Physical Exam Vitals reviewed.  Constitutional:      General: He is not in acute distress.    Appearance: Normal appearance. He is well-developed and normal weight. He is not ill-appearing or diaphoretic.  HENT:     Nose: Nose normal.     Mouth/Throat:     Pharynx: No oropharyngeal exudate.  Eyes:     Extraocular Movements:     Left eye: Abnormal extraocular motion present.     Conjunctiva/sclera:     Right eye: Right conjunctiva is not injected. No chemosis or exudate. Neck:     Thyroid: No thyromegaly.  Cardiovascular:     Rate and Rhythm: Normal rate and regular rhythm.     Heart sounds: Normal heart sounds. No murmur heard.   No friction rub. No gallop.  Pulmonary:  Effort: Pulmonary effort is normal. No respiratory distress.      Breath sounds: Normal breath sounds. No wheezing or rales.  Abdominal:     General: Bowel sounds are normal. There is no distension.     Palpations: Abdomen is soft. There is no mass.     Tenderness: There is no abdominal tenderness. There is no guarding or rebound.  Musculoskeletal:     Cervical back: Neck supple.     Right lower leg: No edema.     Left lower leg: No edema.  Lymphadenopathy:     Cervical: No cervical adenopathy.  Neurological:     Mental Status: He is alert.     Gait: Gait normal.  Psychiatric:        Mood and Affect: Mood normal.        Behavior: Behavior normal.        Thought Content: Thought content normal.        Judgment: Judgment normal.   Tender swollen left basilic vein.  No significant pitting edema in the left upper extremity.  No erythema or warmth       Assessment & Plan:  Arm DVT (deep venous thromboembolism), acute, left (HCC) - Plan: VAS [place] UPPER EXTREMITY VENOUS DUPLEX Overall, the patient is doing much better than when I last saw him.  I believe the tenderness in the basilic vein is likely secondary to the superficial venous thrombus.  I see no evidence of thrombophlebitis or progression of his DVT.  Patient request an ultrasound to rule out extension.  If the DVT has enlarged and is worsening, in the face of Eliquis, I would switch the patient to Lovenox for the duration of the treatment of his malignancy.

## 2021-05-21 ENCOUNTER — Other Ambulatory Visit: Payer: Self-pay | Admitting: Physician Assistant

## 2021-05-23 ENCOUNTER — Encounter: Payer: Self-pay | Admitting: *Deleted

## 2021-05-23 ENCOUNTER — Ambulatory Visit (INDEPENDENT_AMBULATORY_CARE_PROVIDER_SITE_OTHER): Payer: Medicare Other | Admitting: *Deleted

## 2021-05-23 DIAGNOSIS — E119 Type 2 diabetes mellitus without complications: Secondary | ICD-10-CM

## 2021-05-23 DIAGNOSIS — J418 Mixed simple and mucopurulent chronic bronchitis: Secondary | ICD-10-CM

## 2021-05-23 NOTE — Chronic Care Management (AMB) (Signed)
Chronic Care Management   CCM RN Visit Note  05/23/2021 Name: John [name] MRN: [id number] DOB: [birth year]  Subjective: John [name] is a 70 y.o. year old male who is a primary care patient of Pickard, [name], MD. The care management team was consulted for assistance with disease management and care coordination needs.    Engaged with patient by telephone for initial visit in response to provider referral for case management and/or care coordination services.   Consent to Services:  The patient was given the following information about Chronic Care Management services today, agreed to services, and gave verbal consent: 1. CCM service includes personalized support from designated clinical staff supervised by the primary care provider, including individualized plan of care and coordination with other care providers 2. 24/7 contact phone numbers for assistance for urgent and routine care needs. 3. Service will only be billed when office clinical staff spend 20 minutes or more in a month to coordinate care. 4. Only one practitioner may furnish and bill the service in a calendar month. 5.The patient may stop CCM services at any time (effective at the end of the month) by phone call to the office staff. 6. The patient will be responsible for cost sharing (co-pay) of up to 20% of the service fee (after annual deductible is met). Patient agreed to services and consent obtained.  Patient agreed to services and verbal consent obtained.   Assessment: Review of patient past medical history, allergies, medications, health status, including review of consultants reports, laboratory and other test data, was performed as part of comprehensive evaluation and provision of chronic care management services.   SDOH (Social Determinants of Health) assessments and interventions performed:    CCM Care Plan  Allergies  Allergen Reactions   Omega-3 Fatty Acids Hives and Itching   Benazepril Other (See  Comments)    hyperkalemia   Fish Allergy Itching    Outpatient Encounter Medications as of 05/23/2021  Medication Sig   albuterol (VENTOLIN HFA) 108 (90 Base) MCG/ACT inhaler Inhale 2 puffs into the lungs every 6 (six) hours as needed for wheezing or shortness of breath.   amiodarone (PACERONE) 400 MG tablet Take 1 tablet (400 mg total) by mouth 2 (two) times daily.   apixaban (ELIQUIS) 5 MG TABS tablet Take 2 tablets (10 mg total) by mouth 2 (two) times daily for 7 days, THEN 1 tablet (5 mg total) 2 (two) times daily for 23 days.   clopidogrel (PLAVIX) 75 MG tablet Take 1 tablet (75 mg total) by mouth daily with breakfast.   ENTRESTO 24-26 MG TAKE 1 TABLET BY MOUTH TWICE A DAY   Fluticasone Furoate (ARNUITY ELLIPTA) 200 MCG/ACT AEPB Inhale 1 puff into the lungs daily.   insulin aspart (NOVOLOG FLEXPEN) 100 UNIT/ML FlexPen INJECT 5-20 UNITS SUBCUTANEOUSLY WITH LUNCH AND DINNER (Patient taking differently: Inject 6-8 Units into the skin daily as needed for high blood sugar.)   Insulin Pen Needle (NOVOFINE) 32G X 6 MM MISC 1 each by Other route daily.   JARDIANCE 25 MG TABS tablet TAKE 1 TABLET BY MOUTH EVERY DAY   LEVEMIR FLEXTOUCH 100 UNIT/ML FlexTouch Pen INJECT 46 UNITS INTO THE SKIN DAILY. DX:E11.9   metoprolol tartrate (LOPRESSOR) 50 MG tablet Take 1 tablet (50 mg total) by mouth 2 (two) times daily.   mexiletine (MEXITIL) 150 MG capsule Take 2 capsules (300 mg total) by mouth every 12 (twelve) hours.   montelukast (SINGULAIR) 10 MG tablet TAKE 1 TABLET BY MOUTH EVERY  DAY (Patient taking differently: Take 10 mg by mouth daily.)   nitroGLYCERIN (NITROSTAT) 0.4 MG SL tablet PLACE 1 TABLET (0.4 MG TOTAL) UNDER THE TONGUE EVERY 5 (FIVE) MINUTES AS NEEDED FOR CHEST PAIN.   ONETOUCH ULTRA test strip USE TO CHECK BLOOD SUGAR 3 TIMES A DAY (E11.9)   rosuvastatin (CRESTOR) 40 MG tablet Take 1 tablet (40 mg total) by mouth daily.   spironolactone (ALDACTONE) 25 MG tablet Take 0.5 tablets (12.5 mg  total) by mouth daily.   No facility-administered encounter medications on file as of 05/23/2021.    Patient Active Problem List   Diagnosis Date Noted   DVT of left axillary vein, acute ([place]) 28/31/5176   Embolic stroke ([place]) 16/12/3708   History of non-ST elevation myocardial infarction (NSTEMI) 62/69/4854   Chronic systolic CHF (congestive heart failure) ([place]) 02/08/2021   VT (ventricular tachycardia) 01/26/2021   Coagulation defect ([place]) 12/22/2020   Non-ST elevation (NSTEMI) myocardial infarction ([place])    Capsulitis 05/21/2020   Thoracic aortic aneurysm 02/29/2020   Obesity (BMI 30.0-34.9) 02/29/2020   Malignant neoplasm of upper lobe of left lung ([place]) 02/19/2020   Former smoker 02/19/2020   Acoustic neuroma ([place]) 09/11/2017   Asymmetrical sensorineural hearing loss 09/11/2017   Anticoagulation adequate 06/06/2016   Hyperlipidemia LDL goal <70 05/20/2015   OSA on CPAP 07/23/2013   Hypertension    Hyperlipidemia    Tobacco abuse 08/18/2012   CAD, multiple prior RCA PCI's. Last cath 2010, Myoview low risk Oct 2012 02/26/2012   DM type 2, goal HbA1c < 7% (HCC) 02/26/2012   HTN (hypertension) 02/26/2012   COPD (chronic obstructive pulmonary disease) ([place]) 02/26/2012   Smoking, quit one week ago 02/26/2012   GERD (gastroesophageal reflux disease) 02/26/2012    Conditions to be addressed/monitored:COPD and DMII  Care Plan : RN Care Manager Plan of Care  Updates made by Kassie Mends, RN since 05/23/2021 12:00 AM     Problem: No plan of care established for management of chronic disease states  (DM2, COPD, CHF, HTN, Cancer)   Priority: High     Long-Range Goal: Development of plan of care for chronic disease management   (DM2, COPD, CHF, HTN, Cancer)   Start Date: 05/23/2021  Expected End Date: 11/19/2021  Priority: High  Note:   Current Barriers:  Knowledge Deficits related to plan of care for management of COPD and DMII  Patient reports he lives with spouse, is  independent with all aspects of his care, pt drives and has had no issues recently getting to appointments, has a cane that he uses as needed when he leaves home, reports has all medications and taking as prescribed and able to afford, pt reports he has CPAP, inhalers to use for COPD, checks CBG TID with am ranges 115-120, pm ranges 160-170.  Reports he does weigh daily with weight today 211 pounds, has new diagnosis lung cancer few weeks ago and reports "stage 1 and starting radiation this week",  also to start outpatient PT this week, reports no advanced directives and in process of completing with assistance at oncology.  Pt reports "had blood clots in my arm and having ultrasound this week".   RNCM Clinical Goal(s):  Patient will verbalize understanding of plan for management of COPD and DMII as evidenced by patient report, review EHR and  through collaboration with RN Care manager, provider, and care team.   Interventions: 1:1 collaboration with primary care provider regarding development and update of comprehensive plan of care as  evidenced by provider attestation and co-signature Inter-disciplinary care team collaboration (see longitudinal plan of care) Evaluation of current treatment plan related to  self management and patient's adherence to plan as established by provider   COPD Interventions:  (Status:  New goal.) Long Term Goal Provided patient with basic written and verbal COPD education on self care/management/and exacerbation prevention Provided instruction about proper use of medications used for management of COPD including inhalers Advised patient to self assesses COPD action plan zone and make appointment with provider if in the yellow zone for 48 hours without improvement Advised patient to engage in light exercise as tolerated 3-5 days a week to aid in the the management of COPD Provided education about and advised patient to utilize infection prevention strategies to reduce risk  of respiratory infection Discussed the importance of adequate rest and management of fatigue with COPD Screening for signs and symptoms of depression related to chronic disease state  Assessed social determinant of health barriers Education provided via My Chart- COPD action plan  Diabetes Interventions:  (Status:  New goal.) Long Term Goal Assessed patient's understanding of A1c goal: <7% Provided education to patient about basic DM disease process Reviewed medications with patient and discussed importance of medication adherence Counseled on importance of regular laboratory monitoring as prescribed Discussed plans with patient for ongoing care management follow up and provided patient with direct contact information for care management team Provided patient with written educational materials related to hypo and hyperglycemia and importance of correct treatment Review of patient status, including review of consultants reports, relevant laboratory and other test results, and medications completed Education provided via My Chart- hypoglycemia Lab Results  Component Value Date   HGBA1C 6.9 (H) 04/18/2021  Patient Goals/Self-Care Activities: Take all medications as prescribed Attend all scheduled provider appointments Perform all self care activities independently  Call provider office for new concerns or questions  check blood sugar at prescribed times: three times daily check feet daily for cuts, sores or redness enter blood sugar readings and medication or insulin into daily log trim toenails straight across fill half of plate with vegetables manage portion size wear comfortable, well-fitting shoes identify and remove indoor air pollutants limit outdoor activity during cold weather develop a rescue plan follow rescue plan if symptoms flare-up get at least 7 to 8 hours of sleep at night Practice good handwashing, wear a mask as needed PT evaluation 05/23/21,  Radiation 05/26/21 Look  over education sent- hypoglycemia and COPD action plan Call RN care manager for any questions at [phone]  Follow Up Plan:  Telephone follow up appointment with care management team member scheduled for:  06/27/2021      Plan:Telephone follow up appointment with care management team member scheduled for:  06/27/2021  [facility], BSN RN Case Manager [facility] [phone]

## 2021-05-23 NOTE — Progress Notes (Signed)
[place] Psychosocial Distress Screening Clinical Social Work  Clinical Social Work was referred by distress screening protocol.  The patient scored a 8 on the Psychosocial Distress Thermometer which indicates moderate distress. Clinical Social Worker contacted patient by phone to assess for distress and other psychosocial needs.  Patient stated he was doing "ok", and did not have nay concerns at this time.  CSW provided education on CSW role and support team at [facility].  CSW and patient discussed program and resources at [facility].  Patient stated he was aware of the Patient and [facility] and would contact as needed.  CSW encouraged patient to call with questions or concerns.       ONCBCN DISTRESS SCREENING 05/18/2021  Screening Type Initial Screening  Distress experienced in past week (1-10) 8  Emotional problem type Adjusting to illness  Other Contact via phone   Johnnye Lana, MSW, LCSW, OSW-C Clinical Social Worker [place] [phone]        Amalya Salmons P, LCSW

## 2021-05-23 NOTE — Patient Instructions (Addendum)
Visit Information   Thank you for taking time to visit with me today. Please don't hesitate to contact me if I can be of assistance to you before our next scheduled telephone appointment.  Following are the goals we discussed today:  Take all medications as prescribed Attend all scheduled provider appointments Perform all self care activities independently  Call provider office for new concerns or questions  check blood sugar at prescribed times: three times daily check feet daily for cuts, sores or redness enter blood sugar readings and medication or insulin into daily log trim toenails straight across fill half of plate with vegetables manage portion size wear comfortable, well-fitting shoes identify and remove indoor air pollutants limit outdoor activity during cold weather develop a rescue plan follow rescue plan if symptoms flare-up get at least 7 to 8 hours of sleep at night Practice good handwashing, wear a mask as needed PT evaluation 05/23/21,  Radiation 05/26/21 Look over education sent- hypoglycemia and COPD action plan Call RN care manager for any questions at [phone]  Hypoglycemia Hypoglycemia is when the sugar (glucose) level in your blood is too low. Low blood sugar can happen to people who have diabetes and people who do not have diabetes. Low blood sugar can happen quickly, and it can be an emergency. What are the causes? This condition happens most often in people who have diabetes. It may be caused by: Diabetes medicine. Not eating enough, or not eating often enough. Doing more physical activity. Drinking alcohol on an empty stomach. If you do not have diabetes, this condition may be caused by: A tumor in the pancreas. Not eating enough, or not eating for long periods at a time (fasting). A very bad infection or illness. Problems after having weight loss (bariatric) surgery. Kidney failure or liver failure. Certain medicines. What increases the  risk? This condition is more likely to develop in people who: Have diabetes and take medicines to lower their blood sugar. Abuse alcohol. Have a very bad illness. What are the signs or symptoms? Mild Hunger. Sweating and feeling clammy. Feeling dizzy or light-headed. Being sleepy or having trouble sleeping. Feeling like you may vomit (nauseous). A fast heartbeat. A headache. Blurry vision. Mood changes, such as: Being grouchy. Feeling worried or nervous (anxious). Tingling or loss of feeling (numbness) around your mouth, lips, or tongue. Moderate Confusion and poor judgment. Behavior changes. Weakness. Uneven heartbeat. Trouble with moving (coordination). Very low Very low blood sugar (severe hypoglycemia) is a medical emergency. It can cause: Fainting. Seizures. Loss of consciousness (coma). Death. How is this treated? Treating low blood sugar Low blood sugar is often treated by eating or drinking something that has sugar in it right away. The food or drink should contain 15 grams of a fast-acting carb (carbohydrate). Options include: 4 oz (120 mL) of fruit juice. 4 oz (120 COPD Action Plan A COPD action plan is a description of what to do when you have a flare (exacerbation) of chronic obstructive pulmonary disease (COPD). Your action plan is a color-coded plan that lists the symptoms that indicate whether your condition is under control and what actions to take. If you have symptoms in the green zone, it means you are doing well that day. If you have symptoms in the yellow zone, it means you are having a bad day or an exacerbation. If you have symptoms in the red zone, you need urgent medical care. Follow the plan that you and your health care provider developed. Review your  plan with your health care provider at each visit. Red zone Symptoms in this zone mean that you should get medical help right away. They include: Feeling very short of breath, even when you are  resting. Not being able to do any activities because of poor breathing. Not being able to sleep because of poor breathing. Fever or shaking chills. Feeling confused or very sleepy. Chest pain. Coughing up blood. If you have any of these symptoms, call emergency services (911 in the [place] or go to the nearest emergency room. Yellow zone Symptoms in this zone mean that your condition may be getting worse. They include: Feeling more short of breath than usual. Having less energy for daily activities than usual. Phlegm or mucus that is thicker than usual. Needing to use your rescue inhaler or nebulizer more often than usual. More ankle swelling than usual. Coughing more than usual. Feeling like you have a chest cold. Trouble sleeping due to COPD symptoms. Decreased appetite. COPD medicines not helping as much as usual. If you experience any "yellow" symptoms: Keep taking your daily medicines as directed. Use your quick-relief inhaler as told by your health care provider. If you were prescribed steroid medicine to take by mouth (oral medicine), start taking it as told by your health care provider. If you were prescribed an antibiotic medicine, start taking it as told by your health care provider. Do not stop taking the antibiotic even if you start to feel better. Use oxygen as told by your health care provider. Get more rest. Do your pursed-lip breathing exercises. Do not smoke. Avoid any irritants in the air. If your signs and symptoms do not improve after taking these steps, call your health care provider right away. Green zone Symptoms in this zone mean that you are doing well. They include: Being able to do your usual activities and exercise. Having the usual amount of coughing, including the same amount of phlegm or mucus. Being able to sleep well. Having a good appetite. Where to find more information: You can find more information about COPD from: American Lung Association, My  COPD Action Plan: www.lung.org COPD Foundation: www.copdfoundation.[place]: https://wilson-eaton.com/ Follow these instructions at home: Continue taking your daily medicines as told by your health care provider. Make sure you receive all the immunizations that your health care provider recommends, especially the pneumococcal and influenza vaccines. Wash your hands often with soap and water. Have family members wash their hands too. Regular hand washing can help prevent infections. Follow your usual exercise and diet plan. Avoid irritants in the air, such as smoke. Do not use any products that contain nicotine or tobacco. These products include cigarettes, chewing tobacco, and vaping devices, such as e-cigarettes. If you need help quitting, ask your health care provider. Summary A COPD action plan tells you what to do when you have a flare (exacerbation) of chronic obstructive pulmonary disease (COPD). Follow each action plan for your symptoms. If you have any symptoms in the red zone, call emergency services (911 in the [place] or go to the nearest emergency room. This information is not intended to replace advice given to you by your health care provider. Make sure you discuss any questions you have with your health care provider. Document Revised: 04/13/2020 Document Reviewed: 04/13/2020 Elsevier Patient Education  2022 [place] next appointment is by telephone on 06/27/2021 at 945 am  Please call the care guide team at [phone] if you need to cancel  or reschedule your appointment.   If you are experiencing a Mental Health or [facility] or need someone to talk to, please call the [place] National Suicide Prevention Lifeline: 7086915092 or TTY: 667-599-3783 TTY 352-847-7041) to talk to a trained counselor call 1-800-273-TALK (toll free, 24 hour hotline) go to [facility] [address] [place]  [phone]) call 911   Following is a copy of your full care plan:  Care Plan : John [name] of Care  Updates made by Kassie Mends, RN since 05/23/2021 12:00 AM     Problem: No plan of care established for management of chronic disease states  (DM2, COPD, CHF, HTN, Cancer)   Priority: High     Long-Range Goal: Development of plan of care for chronic disease management   (DM2, COPD, CHF, HTN, Cancer)   Start Date: 05/23/2021  Expected End Date: 11/19/2021  Priority: High  Note:   Current Barriers:  Knowledge Deficits related to plan of care for management of COPD and DMII  Patient reports he lives with spouse, is independent with all aspects of his care, pt drives and has had no issues recently getting to appointments, has a cane that he uses as needed when he leaves home, reports has all medications and taking as prescribed and able to afford, pt reports he has CPAP, inhalers to use for COPD, checks CBG TID with am ranges 115-120, pm ranges 160-170.  Reports he does weigh daily with weight today 211 pounds, has new diagnosis lung cancer few weeks ago and reports "stage 1 and starting radiation this week",  also to start outpatient PT this week, reports no advanced directives and in process of completing with assistance at oncology.  Pt reports "had blood clots in my arm and having ultrasound this week".   RNCM Clinical Goal(s):  Patient will verbalize understanding of plan for management of COPD and DMII as evidenced by patient report, review EHR and  through collaboration with RN Care manager, provider, and care team.   Interventions: 1:1 collaboration with primary care provider regarding development and update of comprehensive plan of care as evidenced by provider attestation and co-signature Inter-disciplinary care team collaboration (see longitudinal plan of care) Evaluation of current treatment plan related to  self management and patient's adherence to plan as established  by provider   COPD Interventions:  (Status:  New goal.) Long Term Goal Provided patient with basic written and verbal COPD education on self care/management/and exacerbation prevention Provided instruction about proper use of medications used for management of COPD including inhalers Advised patient to self assesses COPD action plan zone and make appointment with provider if in the yellow zone for 48 hours without improvement Advised patient to engage in light exercise as tolerated 3-5 days a week to aid in the the management of COPD Provided education about and advised patient to utilize infection prevention strategies to reduce risk of respiratory infection Discussed the importance of adequate rest and management of fatigue with COPD Screening for signs and symptoms of depression related to chronic disease state  Assessed social determinant of health barriers Education provided via My Chart- COPD action plan  Diabetes Interventions:  (Status:  New goal.) Long Term Goal Assessed patient's understanding of A1c goal: <7% Provided education to patient about basic DM disease process Reviewed medications with patient and discussed importance of medication adherence Counseled on importance of regular laboratory monitoring as prescribed Discussed plans with patient for ongoing care management follow  up and provided patient with direct contact information for care management team Provided patient with written educational materials related to hypo and hyperglycemia and importance of correct treatment Review of patient status, including review of consultants reports, relevant laboratory and other test results, and medications completed Education provided via My Chart- hypoglycemia Lab Results  Component Value Date   HGBA1C 6.9 (H) 04/18/2021  Patient Goals/Self-Care Activities: Take all medications as prescribed Attend all scheduled provider appointments Perform all self care activities  independently  Call provider office for new concerns or questions  check blood sugar at prescribed times: three times daily check feet daily for cuts, sores or redness enter blood sugar readings and medication or insulin into daily log trim toenails straight across fill half of plate with vegetables manage portion size wear comfortable, well-fitting shoes identify and remove indoor air pollutants limit outdoor activity during cold weather develop a rescue plan follow rescue plan if symptoms flare-up get at least 7 to 8 hours of sleep at night Practice good handwashing, wear a mask as needed PT evaluation 05/23/21,  Radiation 05/26/21 Look over education sent- hypoglycemia and COPD action plan Call RN care manager for any questions at [phone]  Follow Up Plan:  Telephone follow up appointment with care management team member scheduled for:  06/27/2021      Consent to CCM Services: John [name] was given information about Chronic Care Management services including:  CCM service includes personalized support from designated clinical staff supervised by his physician, including individualized plan of care and coordination with other care providers 24/7 contact phone numbers for assistance for urgent and routine care needs. Service will only be billed when office clinical staff spend 20 minutes or more in a month to coordinate care. Only one practitioner may furnish and bill the service in a calendar month. The patient may stop CCM services at any time (effective at the end of the month) by phone call to the office staff. The patient will be responsible for cost sharing (co-pay) of up to 20% of the service fee (after annual deductible is met).  Patient agreed to services and verbal consent obtained.   Patient verbalizes understanding of instructions provided today and agrees to view in [place].   Telephone follow up appointment with care management team member scheduled for:   06/27/2021

## 2021-05-24 ENCOUNTER — Ambulatory Visit (HOSPITAL_COMMUNITY): Payer: Medicare Other | Attending: Family Medicine

## 2021-05-24 ENCOUNTER — Other Ambulatory Visit: Payer: Self-pay

## 2021-05-24 DIAGNOSIS — I472 Ventricular tachycardia, unspecified: Secondary | ICD-10-CM | POA: Diagnosis not present

## 2021-05-24 DIAGNOSIS — R2681 Unsteadiness on feet: Secondary | ICD-10-CM | POA: Diagnosis not present

## 2021-05-24 DIAGNOSIS — R2689 Other abnormalities of gait and mobility: Secondary | ICD-10-CM | POA: Diagnosis not present

## 2021-05-24 NOTE — Therapy (Signed)
[facility] [address] [place], [place], [address] Phone: [phone]   Fax:  [phone]  Physical Therapy Evaluation  Patient Details  Name: John [name] MRN: [id number] Date of Birth: Jul 06, 1950 Referring Provider (PT): Patrecia Pour   Encounter Date: 05/24/2021   PT End of Session - 05/24/21 0949     Visit Number 1    Number of Visits 1    Authorization Type Medicare Part A and B; BCBS Supplement    PT Start Time [phone]    PT Stop Time 1030    PT Time Calculation (min) 42 min    Activity Tolerance Patient tolerated treatment well    Behavior During Therapy [facility] for tasks assessed/performed             Past Medical History:  Diagnosis Date   Arm DVT (deep venous thromboembolism), acute, left ([place])    while off eliquis for bronchoscopy 2022   Atrial flutter (HCC)    s/p cardioversion   Coronary artery disease    Diabetes mellitus    GERD (gastroesophageal reflux disease)    History of nuclear stress test 04/04/2011   lexiscan; mod-large in size fixed inferolateral defect (scar); non-diagnostic for ischemia; low risk scan    Hyperlipidemia    Hypertension    Ischemic cardiomyopathy    Left foot drop    r/t past disk srugery - uses Kevlar brace   Lung cancer ([place]) 04/26/2021   Myocardial infarction [facility])    posterior MI   Osteomyelitis ([place])    s/p left 2nd toe amputation in 01/2021   Pulmonary nodule    Recurrent ventricular tachycardia    Shortness of breath    Sleep apnea    on CPAP; 04/28/2007 split-night - AHI during total sleep 44.43/hr and REM 72.56/hr    Past Surgical History:  Procedure Laterality Date   AMPUTATION TOE Left 02/10/2021   Procedure: AMPUTATION  LEFT SECOND TOE;  Surgeon: Edrick Kins, DPM;  Location: [place];  Service: Podiatry;  Laterality: Left;   [place]   BRONCHIAL BIOPSY  04/26/2021   Procedure: BRONCHIAL BIOPSIES;  Surgeon: Garner Nash, DO;  Location: [place] [facility];   Service: Pulmonary;;   BRONCHIAL BRUSHINGS  04/26/2021   Procedure: BRONCHIAL BRUSHINGS;  Surgeon: Garner Nash, DO;  Location: [place] [facility];  Service: Pulmonary;;   BRONCHIAL NEEDLE ASPIRATION BIOPSY  04/26/2021   Procedure: BRONCHIAL NEEDLE ASPIRATION BIOPSIES;  Surgeon: Garner Nash, DO;  Location: [place];  Service: Pulmonary;;   CARDIAC CATHETERIZATION  2010   6 stents total   CARDIAC CATHETERIZATION  01/2000   percutaneous transluminal coronary balloon angioplasty of mid RCA stenotic lesion   CARDIAC CATHETERIZATION  06/2006   no stenting; ischemic cardiomyopathy, EF 40-45%   CARDIOVERSION N/A 07/28/2016   Procedure: CARDIOVERSION;  Surgeon: Troy Sine, MD;  Location: [place];  Service: Cardiovascular;  Laterality: N/A;   CORONARY ANGIOPLASTY  09/1998   mid-distal RCA balloon dilatation, 4.5 & 5.0 stents    CORONARY ANGIOPLASTY WITH STENT PLACEMENT  03/1994   angioplasty & stenting (non-DES) of circumflex/prox ramus intermedius   CORONARY ANGIOPLASTY WITH STENT PLACEMENT  10/1994   large iliac [id number] stent to RCA   [place]  12/2002   4.54mm stents x2 of RCA   CORONARY ANGIOPLASTY WITH STENT PLACEMENT  01/2005   cutting balloon arthrectomy of distal RCA & Cypher DES 3.5x13; cutting balloon arthrectomy of mid RCA  with Cypher DES 3.5x18   CORONARY ANGIOPLASTY WITH STENT PLACEMENT  11/2008   stenting of mid RCA with 4.0x76mm driver, non-DES   CORONARY BALLOON ANGIOPLASTY N/A 02/15/2021   Procedure: CORONARY BALLOON ANGIOPLASTY;  Surgeon: Troy Sine, MD;  Location: [place] [facility];  Service: Cardiovascular;  Laterality: N/A;   FIDUCIAL MARKER PLACEMENT  04/26/2021   Procedure: FIDUCIAL MARKER PLACEMENT;  Surgeon: Garner Nash, DO;  Location: [facility] [facility];  Service: Pulmonary;;   ICD IMPLANT N/A 08/20/2020   Procedure: ICD IMPLANT;  Surgeon: Constance Haw, MD;  Location: [place] [facility];  Service: Cardiovascular;  Laterality:  N/A;   INTRAVASCULAR PRESSURE WIRE/FFR STUDY N/A 03/02/2020   Procedure: INTRAVASCULAR PRESSURE WIRE/FFR STUDY;  Surgeon: Leonie Man, MD;  Location: [facility];  Service: Cardiovascular;  Laterality: N/A;   LEFT HEART CATH AND CORONARY ANGIOGRAPHY N/A 03/02/2020   Procedure: LEFT HEART CATH AND CORONARY ANGIOGRAPHY;  Surgeon: Leonie Man, MD;  Location: [place] [facility];  Service: Cardiovascular;  Laterality: N/A;   LEFT HEART CATH AND CORONARY ANGIOGRAPHY N/A 08/19/2020   Procedure: LEFT HEART CATH AND CORONARY ANGIOGRAPHY;  Surgeon: Lorretta Harp, MD;  Location: [place] [facility];  Service: Cardiovascular;  Laterality: N/A;   LEFT HEART CATH AND CORONARY ANGIOGRAPHY N/A 02/15/2021   Procedure: LEFT HEART CATH AND CORONARY ANGIOGRAPHY;  Surgeon: Troy Sine, MD;  Location: [place] [facility];  Service: Cardiovascular;  Laterality: N/A;   LEFT HEART CATHETERIZATION WITH CORONARY ANGIOGRAM N/A 02/27/2012   Procedure: LEFT HEART CATHETERIZATION WITH CORONARY ANGIOGRAM;  Surgeon: Lorretta Harp, MD;  [facility];  Service: Cardiovascular;  Laterality: N/A;   TRANSTHORACIC ECHOCARDIOGRAM  07/29/2010   EF 50=55%, mod inf wall hypokinesis & mild post wall hypokinesis; LA mild-mod dilated; mild mitral annular calcif & mild MR; mild TR & elevated RV systolic pressure; AV mildly sclerotic; mild aortic root dilatation    V TACH ABLATION N/A 01/20/2021   Procedure: V TACH ABLATION;  Surgeon: Vickie Epley, MD;  Location: [place] [facility];  Service: Cardiovascular;  Laterality: N/A;   VIDEO BRONCHOSCOPY WITH ENDOBRONCHIAL NAVIGATION Left 04/26/2021   Procedure: VIDEO BRONCHOSCOPY WITH ENDOBRONCHIAL NAVIGATION;  Surgeon: Garner Nash, DO;  Location: [place];  Service: Pulmonary;  Laterality: Left;  ION w/ fiducial   VIDEO BRONCHOSCOPY WITH RADIAL ENDOBRONCHIAL ULTRASOUND  04/26/2021   Procedure: RADIAL ENDOBRONCHIAL ULTRASOUND;  Surgeon: Garner Nash, DO;   Location: [facility];  Service: Pulmonary;;    There were no vitals filed for this visit.    Subjective Assessment - 05/24/21 0951     Subjective Pt had NSTEMI end of August and went through cardiac cath and suffered CVA affective his left eye sight and caused left foot drop.  Pt has had AICD implanted.  With interview pt denies any focal or global deficits and notes other than his left eye sight he is functioning at his baseline level    How long can you walk comfortably? No issues, able to perform shopping trips without rest periods    Currently in Pain? No/denies    Pain Score 0-No pain                [facility] PT Assessment - 05/24/21 0001       Assessment   Medical Diagnosis Ventricular Tach    Referring Provider (PT) Vance Gather B    Onset Date/Surgical Date 02/17/21    Prior Therapy IPR  after CVA      Balance Screen   Has the patient fallen in the past 6 months Yes    How many times? 3    Has the patient had a decrease in activity level because of a fear of falling?  No    Is the patient reluctant to leave their home because of a fear of falling?  No      Home Environment   Living Environment Private residence    Living Arrangements Spouse/significant other    Available Help at Discharge Family    Type of [place] to enter    Entrance Stairs-Number of Steps [place] One level    [place] - single point   left AFO for hx of foot drop     Prior Function   Level of Independence Independent;Independent with household mobility without device;Independent with community mobility with device    Vocation Retired    Leisure active lifestyle outside of home      Observation/Other Assessments   Observations eye patch for left eye      Coordination   Gross Motor Movements are Fluid and Coordinated Yes      Functional Tests   Functional tests Single leg stance;Floor to Stand      Single Leg Stance   Comments 2-4 sec RLE       Floor to Stand   Comments able to bend over and pick up items from ground independently      Posture/Postural Control   Posture/Postural Control No significant limitations      ROM / Strength   AROM / PROM / Strength Strength      Strength   Overall Strength Within functional limits for tasks performed      Transfers   Transfers Sit to Stand    Sit to Stand 7: Independent      Ambulation/Gait   Ambulation/Gait Yes    Ambulation/Gait Assistance 6: Modified independent (Device/Increase time)    Ambulation Distance (Feet) 980 Feet    Assistive device Straight cane    Gait Pattern Within Functional Limits    Ambulation Surface Level;Indoor    Gait Comments 6MWT. Hx of left AFO for chronic foot drop      6 Minute Walk- Baseline   6 Minute Walk- Baseline yes    BP (mmHg) 126/84    HR (bpm) 71    Modified Borg Scale for Dyspnea 0- Nothing at all      6 Minute walk- Post Test   6 Minute Walk Post Test yes    BP (mmHg) 151/87    HR (bpm) 92    02 Sat (%RA) 98 %    Modified Borg Scale for Dyspnea 1- Very mild shortness of breath      6 minute walk test results    Aerobic Endurance Distance Walked 980    Endurance additional comments no SOB or DOE      Balance   Balance Assessed Yes      Static Standing Balance   Static Standing - Balance Support No upper extremity supported    Static Standing - Level of Assistance 7: Independent    Static Standing Balance -  Activities  Single Leg Stance - Right Leg;Single Leg Stance - Left Leg;Tandam Stance - Right Leg;Tandam Stance - Left Leg;Romberg - Eyes Opened;Romberg - Eyes Closed  Objective measurements completed on examination: See above findings.                PT Education - 05/24/21 1027     Education Details discussion of assessment findings    Person(s) Educated Patient    Methods Explanation    Comprehension Verbalized understanding                          Plan - 05/24/21 1027     Clinical Impression Statement Pt is 70 yo male with recent hx of CVA with minimal residual deficits and notes chief complaint is left eye sight.  Able to perform 6MWT x 980 ft with rating of 1/10 RPE and no SOB/DOE noted throughout. Demonstrates good dynamic standing balance and low risk for falls. Pt has chronic hx of left foot drop from spinal problem and has AFO that he is familiar with.  Pt demonstrates little to no functional deficits/limitations and reports he is confident and comfortable with performing physical activity/exercise at home on his own during his typical routine    Stability/Clinical Decision Making Stable/Uncomplicated    Clinical Decision Making Low    Rehab Potential Good    PT Frequency One time visit   one time encounter for evaluation, no skilled services indicated at this time.   Consulted and Agree with Plan of Care Patient             Patient will benefit from skilled therapeutic intervention in order to improve the following deficits and impairments:  Decreased activity tolerance, Decreased strength  Visit Diagnosis: Unsteadiness on feet  Other abnormalities of gait and mobility     Problem List Patient Active Problem List   Diagnosis Date Noted   DVT of left axillary vein, acute ([place]) 16/94/5038   Embolic stroke ([place]) 88/28/0034   History of non-ST elevation myocardial infarction (NSTEMI) 91/79/1505   Chronic systolic CHF (congestive heart failure) ([place]) 02/08/2021   VT (ventricular tachycardia) 01/26/2021   Coagulation defect ([place]) 12/22/2020   Non-ST elevation (NSTEMI) myocardial infarction ([place])    Capsulitis 05/21/2020   Thoracic aortic aneurysm 02/29/2020   Obesity (BMI 30.0-34.9) 02/29/2020   Malignant neoplasm of upper lobe of left lung ([place]) 02/19/2020   Former smoker 02/19/2020   Acoustic neuroma ([place]) 09/11/2017   Asymmetrical sensorineural hearing loss 09/11/2017   Anticoagulation  adequate 06/06/2016   Hyperlipidemia LDL goal <70 05/20/2015   OSA on CPAP 07/23/2013   Hypertension    Hyperlipidemia    Tobacco abuse 08/18/2012   CAD, multiple prior RCA PCI's. Last cath 2010, Myoview low risk Oct 2012 02/26/2012   DM type 2, goal HbA1c < 7% (HCC) 02/26/2012   HTN (hypertension) 02/26/2012   COPD (chronic obstructive pulmonary disease) ([place]) 02/26/2012   Smoking, quit one week ago 02/26/2012   GERD (gastroesophageal reflux disease) 02/26/2012    Toniann Fail, PT 05/24/2021, 10:49 AM  [facility] [address] [place], [place], [address] Phone: [phone]   Fax:  [phone]  Name: ZAEDEN LASTINGER MRN: [id number] Date of Birth: [birth year]

## 2021-05-25 ENCOUNTER — Encounter: Payer: Self-pay | Admitting: Cardiology

## 2021-05-25 ENCOUNTER — Ambulatory Visit (INDEPENDENT_AMBULATORY_CARE_PROVIDER_SITE_OTHER): Payer: Medicare Other

## 2021-05-25 DIAGNOSIS — I82622 Acute embolism and thrombosis of deep veins of left upper extremity: Secondary | ICD-10-CM

## 2021-05-26 ENCOUNTER — Other Ambulatory Visit: Payer: Self-pay

## 2021-05-26 ENCOUNTER — Ambulatory Visit
Admission: RE | Admit: 2021-05-26 | Discharge: 2021-05-26 | Disposition: A | Discharge status: Home / Self Care | Payer: Medicare Other | Source: Ambulatory Visit | Attending: Radiation Oncology | Admitting: Radiation Oncology

## 2021-05-26 DIAGNOSIS — C3412 Malignant neoplasm of upper lobe, left bronchus or lung: Secondary | ICD-10-CM | POA: Insufficient documentation

## 2021-05-29 NOTE — Progress Notes (Signed)
Cardiology Office Note Date:  05/30/2021  Patient ID:  John [name], John [name] October 11, 1950, MRN [id number] PCP:  [name], MD  Cardiologist:  Dr. Claiborne Billings Electrophysiologist: Dr. Curt Bears    Chief Complaint: hospital follow up  History of Present Illness: John [name] is a 70 y.o. male with history of CAD(multiple prior PCIs reported in the RCA and Cx, last stenting in RCA 2010), ICM, HTN, HLD, DM, OSA (w/CPAP), TAA, ILD, quit smoking 2019, AFlutter, back surgery resulting in L foot drop, chronic back pain, stroke  Admitted to [facility] (via [facility]) 08/18/20 with CP, palpitations, weakness, and SOB, all progressive for a day of so increased palpitations, found in [facility] felt to be c/w VT , hypotensive and cardioverted in the ER. (1020J, 200J, 200J),given amio bolus and gtt as well as lidocaine bolus, note report that the amiodarone was stopped 2/2 bradycardia He underwent cath with no new or obstructive CAD Initially consulted by Dr. Quentin Ore, [facility] c/w VT and recommended ICD implant, (suspect to be scar mediated) Dr. Curt Bears implanted his device 08/20/20 Discharged 08/21/20   Hospitalized March,  June, July, Aug 2022 with VT, AAD added and escalated also had a VT ablation   He was admitted 02/11/21 with L great toe cellulitis and underwent amputation.   Readmitted 02/14/21 with CP, NSTEMI, underwent cath and PCI  (Difficult for successful PTCA/PCI using 3.5 mm ScoreFlex balloon multiple inflations with post dilation of the previously stented) overlapping Onyx Frontier DES 2.75 mm x 22 mm placed overlapping distally); prox RCA ~50%, prox-distal RCA 35%.  Unfortunately suffered a stroke and also found with a lung mass.   Finally discharged to rehab 02/18/21  02/25/21 admitted again with VT (broke with manual stim via his device by Dr. Loletha Grayer) his mexiletine increased  04/17/21 admitted again with VT device shocks at home, presented in sustained MMVT, sotalol stopped, amio started during his stay he also  underwent bronch/mass biopsy, developed LUE swelling and found with DVT Discharged on 04/29/21 Amiodarone 400mg  BID x29month, EP will see for titration out patient [name] 300mg  BID Lopressor 50mg  BID  He saw gen cards 05/05/21, C. Sarajane Jews, PA-C doing well, had f/u with his PMD planned for continued DVT therapy dosing of his Eliquis for another week, no cardiac complaints, no shocks, no symptoms of his VT Planned for labs and repeat echo in a few months  05/09/21: saw pulmonary (virtually) cytology was positive for malignant cells consistent with non-small cell carcinoma, planned for XRT, not felt a good surgical candidate 2/2 cardiac status  05/18/21: sw radiation/onc planned to start XRT  05/19/21: saw PMD service, f/u on DVT, felt to be clinically improved, pt requesting repeat US, if DVT is worse,extended planned for change to lovenox for duration of his treatment of lung ca. >> f/u study was neg for DVT, superficial thrombus remained, no plans to change therapy  TODAY He is doing pretty well Will get started with XRT soon, has been told should only require a few treatments and his device is not in the way He had a slight fluttering sensation a couple nights ago, otherwise no cardiac awareness, no CP. If he has had VT he has been unaware of it No near syncope or syncope No shocks No bleeding or signs of bleeding, back on 5mg  BID of Eliquis  He is unable to reliably cut the spironolactone tablets in 1/2, often crumbles and much is wasted.  Hasnt really taken any for about 5 days, has gained some weight off  it, no unusual SOB  Device information MDT dual chamber ICD implanted 08/20/20 Secondary prevention w/hx of hemodynamically unstable MMVT  AAD hx 2018 Pulmonary function studies have shown restriction with low DLCO.  He felt possibly also to have asthma based on nitric oxide testing.  With his lung disease it has been recommended that amiodarone be considered for  discontinuance Looks like amio was stopped in 2019 Follows with pulmonology March 2022 mexilletine > recurrent VT higher doses were too expensive June 2022 sotalol started Aug 2022 more VT and mexiletine resumed 01/18/21, VT ablation 02/09/21, device interrogation done for MRI noted VT treated with ATP and mexiletine ordered for increase to 300mg  BID >>>> THIS DID NOT GET TRANSLATED ON DISCHARGE AND remained at 137ms Sept 2022 with recurrent sustained VT > mexiletine increased Oct VT admission sotalol stopped, amio started  Past Medical History:  Diagnosis Date   Arm DVT (deep venous thromboembolism), acute, left (HCC)    while off eliquis for bronchoscopy 2022   Atrial flutter (HCC)    s/p cardioversion   Coronary artery disease    Diabetes mellitus    GERD (gastroesophageal reflux disease)    History of nuclear stress test 04/04/2011   lexiscan; mod-large in size fixed inferolateral defect (scar); non-diagnostic for ischemia; low risk scan    Hyperlipidemia    Hypertension    Ischemic cardiomyopathy    Left foot drop    r/t past disk srugery - uses Kevlar brace   Lung cancer ([place]) 04/26/2021   Myocardial infarction [facility])    posterior MI   Osteomyelitis ([place])    s/p left 2nd toe amputation in 01/2021   Pulmonary nodule    Recurrent ventricular tachycardia    Shortness of breath    Sleep apnea    on CPAP; 04/28/2007 split-night - AHI during total sleep 44.43/hr and REM 72.56/hr    Past Surgical History:  Procedure Laterality Date   AMPUTATION TOE Left 02/10/2021   Procedure: AMPUTATION  LEFT SECOND TOE;  Surgeon: Edrick Kins, DPM;  Location: [place];  Service: Podiatry;  Laterality: Left;   [place]   BRONCHIAL BIOPSY  04/26/2021   Procedure: BRONCHIAL BIOPSIES;  Surgeon: Garner Nash, DO;  Location: [place] ENDOSCOPY;  Service: Pulmonary;;   BRONCHIAL BRUSHINGS  04/26/2021   Procedure: BRONCHIAL BRUSHINGS;  Surgeon: Garner Nash, DO;  Location: [place] ENDOSCOPY;   Service: Pulmonary;;   BRONCHIAL NEEDLE ASPIRATION BIOPSY  04/26/2021   Procedure: BRONCHIAL NEEDLE ASPIRATION BIOPSIES;  Surgeon: Garner Nash, DO;  Location: [place];  Service: Pulmonary;;   CARDIAC CATHETERIZATION  2010   6 stents total   CARDIAC CATHETERIZATION  01/2000   percutaneous transluminal coronary balloon angioplasty of mid RCA stenotic lesion   CARDIAC CATHETERIZATION  06/2006   no stenting; ischemic cardiomyopathy, EF 40-45%   CARDIOVERSION N/A 07/28/2016   Procedure: CARDIOVERSION;  Surgeon: Troy Sine, MD;  Location: [place];  Service: Cardiovascular;  Laterality: N/A;   CORONARY ANGIOPLASTY  09/1998   mid-distal RCA balloon dilatation, 4.5 & 5.0 stents    CORONARY ANGIOPLASTY WITH STENT PLACEMENT  03/1994   angioplasty & stenting (non-DES) of circumflex/prox ramus intermedius   CORONARY ANGIOPLASTY WITH STENT PLACEMENT  10/1994   large iliac [id number] stent to RCA   [place]  12/2002   4.26mm stents x2 of RCA   CORONARY ANGIOPLASTY WITH STENT PLACEMENT  01/2005   cutting balloon arthrectomy of distal RCA & Cypher DES 3.5x13;  cutting balloon arthrectomy of mid RCA with Cypher DES 3.5x18   CORONARY ANGIOPLASTY WITH STENT PLACEMENT  11/2008   stenting of mid RCA with 4.0x90mm driver, non-DES   CORONARY BALLOON ANGIOPLASTY N/A 02/15/2021   Procedure: CORONARY BALLOON ANGIOPLASTY;  Surgeon: Troy Sine, MD;  Location: [place] CV LAB;  Service: Cardiovascular;  Laterality: N/A;   FIDUCIAL MARKER PLACEMENT  04/26/2021   Procedure: FIDUCIAL MARKER PLACEMENT;  Surgeon: Garner Nash, DO;  Location: [place] ENDOSCOPY;  Service: Pulmonary;;   ICD IMPLANT N/A 08/20/2020   Procedure: ICD IMPLANT;  Surgeon: Constance Haw, MD;  Location: [place] CV LAB;  Service: Cardiovascular;  Laterality: N/A;   INTRAVASCULAR PRESSURE WIRE/FFR STUDY N/A 03/02/2020   Procedure: INTRAVASCULAR PRESSURE WIRE/FFR STUDY;  Surgeon: Leonie Man, MD;   Location: [place] CV LAB;  Service: Cardiovascular;  Laterality: N/A;   LEFT HEART CATH AND CORONARY ANGIOGRAPHY N/A 03/02/2020   Procedure: LEFT HEART CATH AND CORONARY ANGIOGRAPHY;  Surgeon: Leonie Man, MD;  Location: [place] CV LAB;  Service: Cardiovascular;  Laterality: N/A;   LEFT HEART CATH AND CORONARY ANGIOGRAPHY N/A 08/19/2020   Procedure: LEFT HEART CATH AND CORONARY ANGIOGRAPHY;  Surgeon: Lorretta Harp, MD;  Location: [place] CV LAB;  Service: Cardiovascular;  Laterality: N/A;   LEFT HEART CATH AND CORONARY ANGIOGRAPHY N/A 02/15/2021   Procedure: LEFT HEART CATH AND CORONARY ANGIOGRAPHY;  Surgeon: Troy Sine, MD;  Location: [place] CV LAB;  Service: Cardiovascular;  Laterality: N/A;   LEFT HEART CATHETERIZATION WITH CORONARY ANGIOGRAM N/A 02/27/2012   Procedure: LEFT HEART CATHETERIZATION WITH CORONARY ANGIOGRAM;  Surgeon: Lorretta Harp, MD;  Location: [facility] C[facility] LAB;  Service: Cardiovascular;  Laterality: N/A;   TRANSTHORACIC ECHOCARDIOGRAM  07/29/2010   EF 50=55%, mod inf wall hypokinesis & mild post wall hypokinesis; LA mild-mod dilated; mild mitral annular calcif & mild MR; mild TR & elevated RV systolic pressure; AV mildly sclerotic; mild aortic root dilatation    V TACH ABLATION N/A 01/20/2021   Procedure: V TACH ABLATION;  Surgeon: Vickie Epley, MD;  Location: [place] CV LAB;  Service: Cardiovascular;  Laterality: N/A;   VIDEO BRONCHOSCOPY WITH ENDOBRONCHIAL NAVIGATION Left 04/26/2021   Procedure: VIDEO BRONCHOSCOPY WITH ENDOBRONCHIAL NAVIGATION;  Surgeon: Garner Nash, DO;  Location: [place];  Service: Pulmonary;  Laterality: Left;  ION w/ fiducial   VIDEO BRONCHOSCOPY WITH RADIAL ENDOBRONCHIAL ULTRASOUND  04/26/2021   Procedure: RADIAL ENDOBRONCHIAL ULTRASOUND;  Surgeon: Garner Nash, DO;  Location: [facility];  Service: Pulmonary;;    Current Outpatient Medications  Medication Sig Dispense Refill   albuterol (VENTOLIN HFA) 108  (90 Base) MCG/ACT inhaler Inhale 2 puffs into the lungs every 6 (six) hours as needed for wheezing or shortness of breath. 1 each 6   amiodarone (PACERONE) 400 MG tablet Take 1 tablet (400 mg total) by mouth 2 (two) times daily. 60 tablet 1   clopidogrel (PLAVIX) 75 MG tablet Take 1 tablet (75 mg total) by mouth daily with breakfast.     ENTRESTO 24-26 MG TAKE 1 TABLET BY MOUTH TWICE A DAY 60 tablet 6   Fluticasone Furoate (ARNUITY ELLIPTA) 200 MCG/ACT AEPB Inhale 1 puff into the lungs daily. 30 each 6   insulin aspart (NOVOLOG FLEXPEN) 100 UNIT/ML FlexPen INJECT 5-20 UNITS SUBCUTANEOUSLY WITH LUNCH AND DINNER (Patient taking differently: Inject 6-8 Units into the skin daily as needed for high blood sugar.) 15 mL 1   Insulin Pen Needle (NOVOFINE) 32G  X 6 MM MISC 1 each by Other route daily. 100 each 3   JARDIANCE 25 MG TABS tablet TAKE 1 TABLET BY MOUTH EVERY DAY 30 tablet 5   LEVEMIR FLEXTOUCH 100 UNIT/ML FlexTouch Pen INJECT 46 UNITS INTO THE SKIN DAILY. DX:E11.9 15 mL 3   metoprolol tartrate (LOPRESSOR) 50 MG tablet Take 1 tablet (50 mg total) by mouth 2 (two) times daily. 60 tablet 5   mexiletine (MEXITIL) 150 MG capsule Take 2 capsules (300 mg total) by mouth every 12 (twelve) hours. 120 capsule 5   montelukast (SINGULAIR) 10 MG tablet TAKE 1 TABLET BY MOUTH EVERY DAY (Patient taking differently: Take 10 mg by mouth daily.) 90 tablet 3   nitroGLYCERIN (NITROSTAT) 0.4 MG SL tablet PLACE 1 TABLET (0.4 MG TOTAL) UNDER THE TONGUE EVERY 5 (FIVE) MINUTES AS NEEDED FOR CHEST PAIN. 25 tablet 1   ONETOUCH ULTRA test strip USE TO CHECK BLOOD SUGAR 3 TIMES A DAY (E11.9) 100 strip 2   rosuvastatin (CRESTOR) 40 MG tablet Take 1 tablet (40 mg total) by mouth daily. 30 tablet 0   apixaban (ELIQUIS) 5 MG TABS tablet Take 1 tablet (5 mg total) by mouth 2 (two) times daily. 180 tablet 1   spironolactone (ALDACTONE) 25 MG tablet Take 1 tablet (25 mg total) by mouth every other day. 45 tablet 1   No current  facility-administered medications for this visit.    Allergies:   Omega-3 fatty acids, Benazepril, and Fish allergy   Social History:  The patient  reports that he quit smoking about 4 years ago. His smoking use included cigarettes. He has a 50.00 pack-year smoking history. He has never used smokeless tobacco. He reports that he does not currently use alcohol. He reports that he does not use drugs.   Family History:  The patient's family history includes Bone cancer in his mother; Heart attack in his father.  ROS:  Please see the history of present illness.    All other systems are reviewed and otherwise negative.   PHYSICAL EXAM:  VS:  BP 112/72   Pulse 86   Ht 6' (1.829 m)   Wt 217 lb (98.4 kg)   SpO2 95%   BMI 29.43 kg/m  BMI: Body mass index is 29.43 kg/m. Well nourished, well developed, in no acute distress HEENT: normocephalic, atraumatic Neck: no JVD, carotid bruits or masses Cardiac:  RRR; no significant murmurs, no rubs, or gallops Lungs:  CTA b/l, no wheezing, rhonchi or rales Abd: soft, nontender MS: no deformity or atrophy Ext: no edema Skin: warm and dry, no rash Neuro:  No gross deficits appreciated Psych: euthymic mood, full affect  ICD site is well healed, stable, no tethering or discomfort   EKG:  No new EKGs  Device interrogation done today and reviewed by myself:  Battery and lead measurements are good Since discharge he has had 2 episodes of treated VT 11/17 MMVT rate143 > ATP x2, success on 2nd 11.23 he had MMVT that wobbled in/out of 1st VT zone though ultimately did stay in zone, had ATP x6 with final success.  04/18/21: TTE IMPRESSIONS   1. Left ventricular ejection fraction, by estimation, is 25 to 30%. Left  ventricular ejection fraction by 3D volume is 27 %. The left ventricle has  severely decreased function. The left ventricle demonstrates regional wall  motion abnormalities (see  scoring diagram/findings for description). The left  ventricular internal  cavity size was moderately dilated. Left ventricular diastolic parameters  are indeterminate.  Elevated left ventricular end-diastolic pressure. There  is severe hypokinesis of the  left ventricular, basal-mid inferoseptal wall and lateral wall. There is  akinesis of the left ventricular, basal-mid inferior wall and  inferolateral wall.   2. Right ventricular systolic function is normal. The right ventricular  size is mildly enlarged. There is normal pulmonary artery systolic  pressure. The estimated right ventricular systolic pressure is 47.4 mmHg.   3. The mitral valve is normal in structure. Mild mitral valve  regurgitation. No evidence of mitral stenosis.   4. The aortic valve is normal in structure. Aortic valve regurgitation is  not visualized. No aortic stenosis is present.   5. Aortic dilatation noted. There is mild dilatation of the aortic root,  measuring 38 mm. There is mild dilatation of the ascending aorta,  measuring 43 mm.   6. The inferior vena cava is dilated in size with <50% respiratory  variability, suggesting right atrial pressure of 15 mmHg.   7. Compared to study dated 02/15/2021, LVF has declined    02/15/21: LHC/PCI  1st Mrg lesion is 55% stenosed.   Prox RCA lesion is 50% stenosed.   Prox RCA to Dist RCA lesion is 35% stenosed.   RPAV-1 lesion is 100% stenosed.   RPAV-2 lesion is 80% stenosed.   A stent was successfully placed.   Post intervention, there is a 0% residual stenosis.   Post intervention, there is a 0% residual stenosis.   Acute coronary syndrome secondary to total occlusion of a large distal right coronary artery at a site of prior distal stenting.   The LAD has mild irregularity without significant disease.  The large ramus intermediate vessel has previously noted 55% very proximal stenosis not significantly changed.  The AV groove circumflex is a small vessel.   The RCA is a very large vessel with diffuse stents  proximally to very distally.  There is proximal 50% stenosis prior to the proximal to mid stent.  There is diffuse 35% narrowing within the long proximal stent.  The distal stent is totally occluded just after PDA vessel.   Difficult but successful PCI requiring PTCA, a 3.5 x 15 mm Scorflex multiple dilatations throughout the previously placed tandem 3.5 mm  distal stents with ultimate noncompliant balloon dilatation to approximately 3.6 mm and insertion of a 2.75 x 22 mm Onyx Frontier new stent distal to the distal stent placed in overlap 75 to 80% stenosis beyond the stented segment, postdilated with a 3 oh balloon with residual narrowing of 0%.   LVEDP 18 mmHg   RECOMMENDATION: Initial triple drug therapy with plan discontinuance of aspirin and continuation of Plavix/for minimum of 6 to 12 months.  Near the completion of the procedure, the patient appeared slightly less responsive.  Blood sugar was obtained which was 63.  He was given one half amp of D50.  After the procedure, a subtle left facial droop developed and there appeared to be  left arm weakness..  A code stroke was activated and the patient was seen immediately by neurology and sent for head CT for further evaluation.    08/19/20: LHC Prox RCA to Dist RCA lesion is 15% stenosed. Ost RCA to Prox RCA lesion is 40% stenosed. Ramus lesion is 60% stenosed. Dist RCA lesion is 45% stenosed. Mr. Pulsifer has known disease and underwent diagnostic coronary angiography by Dr. Ellyn Hack in September 2021.  He had noncritical CAD with moderate proximal RCA and ramus branch stenosis both of which were shown to be  not physiologically significant by DFR.  He presented with chest pain and monomorphic VT at 220 bpm.  His enzymes rose mildly suspect related to demand ischemia.  His anatomy is unchanged from his previous cath.  His EKG on presentation suggested "scar VT".  And EP evaluation would be warranted.  Continue medical therapy is recommended.  The  sheath was removed and a TR band was placed on the right wrist to achieve patent hemostasis.  The patient did receive his Eliquis last dose yesterday morning.  Heparin will be restarted 4 hours after sheath removal without a bolus.  The patient left lab in stable condition. [name]. MD, [facility] 08/19/2020 9:04 AM  03/01/2020 TTE IMPRESSIONS  1. Left ventricular ejection fraction, by estimation, is 45 to 50%. The  left ventricle has mildly decreased function. The left ventricle  demonstrates global hypokinesis. Left ventricular diastolic parameters are  indeterminate.   2. Right ventricular systolic function is normal. The right ventricular  size is normal.   3. Left atrial size was severely dilated.   4. The mitral valve is normal in structure. Trivial mitral valve  regurgitation. No evidence of mitral stenosis.   5. The aortic valve is tricuspid. Aortic valve regurgitation is not  visualized. No aortic stenosis is present.   6. Aortic dilatation noted. There is mild dilatation of the aortic root,  measuring 41 mm.   7. The inferior vena cava is dilated in size with >50% respiratory  variability, suggesting right atrial pressure of 8 mmHg.    Recent Labs: 01/17/2021: B Natriuretic Peptide 169.7 04/17/2021: TSH 0.651 04/18/2021: ALT 13 05/05/2021: BUN 13; Creatinine, Ser 1.15; Hemoglobin 17.5; Magnesium 2.3; Platelets 254; Potassium 4.6; Sodium 147  04/18/2021: Cholesterol 120; HDL 30; LDL Cholesterol 74; Total CHOL/HDL Ratio 4.0; Triglycerides 80; VLDL 16   CrCl cannot be calculated (Patient's most recent lab result is older than the maximum 21 days allowed.).   Wt Readings from Last 3 Encounters:  05/30/21 217 lb (98.4 kg)  05/19/21 211 lb (95.7 kg)  05/18/21 212 lb (96.2 kg)     Other studies reviewed: Additional studies/records reviewed today include: summarized above  ASSESSMENT AND PLAN:  1. ICD     Intact function     No programming changes made  2. VT     He has 2  VTs, some EGMs dual tachycardias     Struggling this year with VT     On mexiletine, amiodarone currently     He has had treated VT since discharge  I have asked him to send a transmission in 2 weeks, if no VT will plan to reduce his amiodarone to 20mg  BID for 2 weeks then to daily Labs today  3. AFlutter 4. Also has had recent DVT     CHA2DS2Vasc is 5, on Eliquis, appropriately dosed One labeled AF episode noted, 23 hours, avg HR 85bpm, appeared to have perhaps RVR vs VT as well within this event, though avg HR only 85  5. CAD     Cath/PCI as above     No anginal symptoms     On BB, statin, Plavix  6. ICM 7. Chronic CHF (systolic)     No exam findings or symptoms of volume OL     on BB/Entresto      Has had trouble cutting his aldactone and not had any in several days. OptiVol is below threshold though trending up Change to spironolactone 25mg  QOD    Disposition: Transmission in 2 weeks  to decide amio titration, see [place] back in 6 weeks or so, sooner if needed  Current medicines are reviewed at length with the patient today.  The patient did not have any concerns regarding medicines.  Venetia Night, PA-C 05/30/2021 9:53 AM     [place] [address] [place] [id number] [phone] (office)  [phone] (fax)

## 2021-05-30 ENCOUNTER — Ambulatory Visit (INDEPENDENT_AMBULATORY_CARE_PROVIDER_SITE_OTHER): Payer: Medicare Other | Admitting: Physician Assistant

## 2021-05-30 ENCOUNTER — Encounter: Payer: Self-pay | Admitting: Physician Assistant

## 2021-05-30 ENCOUNTER — Other Ambulatory Visit: Payer: Self-pay

## 2021-05-30 VITALS — BP 112/72 | HR 86 | Ht 72.0 in | Wt 217.0 lb

## 2021-05-30 DIAGNOSIS — Z79899 Other long term (current) drug therapy: Secondary | ICD-10-CM

## 2021-05-30 DIAGNOSIS — I472 Ventricular tachycardia, unspecified: Secondary | ICD-10-CM | POA: Diagnosis not present

## 2021-05-30 DIAGNOSIS — I2510 Atherosclerotic heart disease of native coronary artery without angina pectoris: Secondary | ICD-10-CM

## 2021-05-30 DIAGNOSIS — Z95810 Presence of automatic (implantable) cardiac defibrillator: Secondary | ICD-10-CM | POA: Diagnosis not present

## 2021-05-30 DIAGNOSIS — I4892 Unspecified atrial flutter: Secondary | ICD-10-CM | POA: Diagnosis not present

## 2021-05-30 DIAGNOSIS — I214 Non-ST elevation (NSTEMI) myocardial infarction: Secondary | ICD-10-CM

## 2021-05-30 DIAGNOSIS — I5022 Chronic systolic (congestive) heart failure: Secondary | ICD-10-CM | POA: Diagnosis not present

## 2021-05-30 DIAGNOSIS — I255 Ischemic cardiomyopathy: Secondary | ICD-10-CM | POA: Diagnosis not present

## 2021-05-30 LAB — CBC
Hematocrit: 49.8 % (ref 37.5–51.0)
Hemoglobin: 16.5 g/dL (ref 13.0–17.7)
MCH: 30.2 pg (ref 26.6–33.0)
MCHC: 33.1 g/dL (ref 31.5–35.7)
MCV: 91 fL (ref 79–97)
Platelets: 162 10*3/uL (ref 150–450)
RBC: 5.46 x10E6/uL (ref 4.14–5.80)
RDW: 13 % (ref 11.6–15.4)
WBC: 7.7 10*3/uL (ref 3.4–10.8)

## 2021-05-30 LAB — COMPREHENSIVE METABOLIC PANEL
ALT: 14 IU/L (ref 0–44)
AST: 22 IU/L (ref 0–40)
Albumin/Globulin Ratio: 1.9 (ref 1.2–2.2)
Albumin: 3.9 g/dL (ref 3.7–4.7)
Alkaline Phosphatase: 111 IU/L (ref 44–121)
BUN/Creatinine Ratio: 15 (ref 10–24)
BUN: 14 mg/dL (ref 8–27)
Bilirubin Total: 0.4 mg/dL (ref 0.0–1.2)
CO2: 21 mmol/L (ref 20–29)
Calcium: 8.7 mg/dL (ref 8.6–10.2)
Chloride: 108 mmol/L — ABNORMAL HIGH (ref 96–106)
Creatinine, Ser: 0.94 mg/dL (ref 0.76–1.27)
Globulin, Total: 2.1 g/dL (ref 1.5–4.5)
Glucose: 216 mg/dL — ABNORMAL HIGH (ref 70–99)
Potassium: 4.3 mmol/L (ref 3.5–5.2)
Sodium: 143 mmol/L (ref 134–144)
Total Protein: 6 g/dL (ref 6.0–8.5)
eGFR: 86 mL/min/{1.73_m2} (ref >59)

## 2021-05-30 LAB — MAGNESIUM: Magnesium: 2.1 mg/dL (ref 1.6–2.3)

## 2021-05-30 LAB — TSH: TSH: 1.92 u[IU]/mL (ref 0.450–4.500)

## 2021-05-30 MED ORDER — SPIRONOLACTONE 25 MG PO TABS: 25.0000 mg | ORAL_TABLET | ORAL | 1 refills | Status: DC

## 2021-05-30 MED ORDER — APIXABAN 5 MG PO TABS: 5.0000 mg | ORAL_TABLET | Freq: Two times a day (BID) | ORAL | 1 refills | Status: DC

## 2021-05-30 NOTE — Patient Instructions (Addendum)
Medication Instructions:   START TAKING ELIQUIS 5 MG TWICE A DAY   START TAKING SPIRONOLACTONE 25 MG EVERY OTHER DAY   *If you need a refill on your cardiac medications before your next appointment, please call your pharmacy*   Lab Work:   CMET MAG CBC AND TSH   If you have labs (blood work) drawn today and your tests are completely normal, you will receive your results only by: [place] (if you have MyChart) OR A paper copy in the mail If you have any lab test that is abnormal or we need to change your treatment, we will call you to review the results.   Testing/Procedures: NONE ORDERED  TODAY   Follow-Up: At [facility], you and your health needs are our priority.  As part of our continuing mission to provide you with exceptional heart care, we have created designated Provider Care Teams.  These Care Teams include your primary Cardiologist (physician) and Advanced Practice Providers (APPs -  Physician Assistants and Nurse Practitioners) who all work together to provide you with the care you need, when you need it.  We recommend signing up for the patient portal called "MyChart".  Sign up information is provided on this After Visit Summary.  MyChart is used to connect with patients for Virtual Visits (Telemedicine).  Patients are able to view lab/test results, encounter notes, upcoming appointments, etc.  Non-urgent messages can be sent to your provider as well.   To learn more about what you can do with MyChart, go to [email or web address].    Your next appointment:   6 week(s) ( CONTACT [facility] FOR EP SCHEDULING ISSUES )  The format for your next appointment:   In Person  Provider:   Allegra Lai, MD    Other Instructions

## 2021-05-31 ENCOUNTER — Other Ambulatory Visit: Payer: Self-pay | Admitting: Physician Assistant

## 2021-06-07 ENCOUNTER — Other Ambulatory Visit: Payer: Self-pay

## 2021-06-07 ENCOUNTER — Ambulatory Visit
Admission: RE | Admit: 2021-06-07 | Discharge: 2021-06-07 | Disposition: A | Discharge status: Home / Self Care | Payer: Medicare Other | Source: Ambulatory Visit | Attending: Radiation Oncology | Admitting: Radiation Oncology

## 2021-06-07 DIAGNOSIS — C3412 Malignant neoplasm of upper lobe, left bronchus or lung: Secondary | ICD-10-CM | POA: Diagnosis not present

## 2021-06-08 ENCOUNTER — Ambulatory Visit: Payer: Medicare Other | Admitting: Radiation Oncology

## 2021-06-09 ENCOUNTER — Ambulatory Visit
Admission: RE | Admit: 2021-06-09 | Discharge: 2021-06-09 | Disposition: A | Discharge status: Home / Self Care | Payer: Medicare Other | Source: Ambulatory Visit | Attending: Radiation Oncology | Admitting: Radiation Oncology

## 2021-06-09 ENCOUNTER — Other Ambulatory Visit: Payer: Self-pay

## 2021-06-09 ENCOUNTER — Encounter: Payer: Self-pay | Admitting: Cardiovascular Disease

## 2021-06-09 ENCOUNTER — Other Ambulatory Visit: Payer: Self-pay | Admitting: Cardiovascular Disease

## 2021-06-09 DIAGNOSIS — C3412 Malignant neoplasm of upper lobe, left bronchus or lung: Secondary | ICD-10-CM | POA: Diagnosis not present

## 2021-06-09 MED ORDER — ROSUVASTATIN CALCIUM 40 MG PO TABS: 40.0000 mg | ORAL_TABLET | Freq: Every day | ORAL | 1 refills | Status: DC

## 2021-06-14 ENCOUNTER — Ambulatory Visit
Admission: RE | Admit: 2021-06-14 | Discharge: 2021-06-14 | Disposition: A | Discharge status: Home / Self Care | Payer: Medicare Other | Source: Ambulatory Visit | Attending: Radiation Oncology | Admitting: Radiation Oncology

## 2021-06-14 ENCOUNTER — Other Ambulatory Visit: Payer: Self-pay

## 2021-06-14 ENCOUNTER — Encounter: Payer: Self-pay | Admitting: Radiation Oncology

## 2021-06-14 ENCOUNTER — Ambulatory Visit (INDEPENDENT_AMBULATORY_CARE_PROVIDER_SITE_OTHER): Payer: Medicare Other

## 2021-06-14 ENCOUNTER — Other Ambulatory Visit: Payer: Self-pay | Admitting: *Deleted

## 2021-06-14 DIAGNOSIS — I255 Ischemic cardiomyopathy: Secondary | ICD-10-CM

## 2021-06-14 DIAGNOSIS — C3412 Malignant neoplasm of upper lobe, left bronchus or lung: Secondary | ICD-10-CM | POA: Diagnosis not present

## 2021-06-14 LAB — CUP PACEART REMOTE DEVICE CHECK
Battery Remaining Longevity: 106 mo
Battery Voltage: 3.02 V
Brady Statistic AP VP Percent: 3.55 %
Brady Statistic AP VS Percent: 96.43 %
Brady Statistic AS VP Percent: 0 %
Brady Statistic AS VS Percent: 0.01 %
Brady Statistic RA Percent Paced: 99.97 %
Brady Statistic RV Percent Paced: 3.96 %
Date Time Interrogation Session: 20221227012402
HighPow Impedance: 79 Ohm
Implantable Lead Implant Date: 20220304
Implantable Lead Implant Date: 20220304
Implantable Lead Location: 753859
Implantable Lead Location: 753860
Implantable Lead Model: 5076
Implantable Pulse Generator Implant Date: 20220304
Lead Channel Impedance Value: 437 Ohm
Lead Channel Impedance Value: 494 Ohm
Lead Channel Impedance Value: 494 Ohm
Lead Channel Pacing Threshold Amplitude: 0.75 V
Lead Channel Pacing Threshold Amplitude: 0.75 V
Lead Channel Pacing Threshold Pulse Width: 0.4 ms
Lead Channel Pacing Threshold Pulse Width: 0.4 ms
Lead Channel Sensing Intrinsic Amplitude: 25 mV
Lead Channel Sensing Intrinsic Amplitude: 25 mV
Lead Channel Sensing Intrinsic Amplitude: 4.125 mV
Lead Channel Sensing Intrinsic Amplitude: 4.125 mV
Lead Channel Setting Pacing Amplitude: 1.5 V
Lead Channel Setting Pacing Amplitude: 2 V
Lead Channel Setting Pacing Pulse Width: 0.4 ms
Lead Channel Setting Sensing Sensitivity: 0.3 mV

## 2021-06-14 MED ORDER — AMIODARONE HCL 200 MG PO TABS: 200.0000 mg | ORAL_TABLET | Freq: Every day | ORAL | 3 refills | Status: DC

## 2021-06-16 ENCOUNTER — Encounter: Payer: Self-pay | Admitting: Cardiology

## 2021-06-18 DIAGNOSIS — E1159 Type 2 diabetes mellitus with other circulatory complications: Secondary | ICD-10-CM

## 2021-06-18 DIAGNOSIS — Z794 Long term (current) use of insulin: Secondary | ICD-10-CM

## 2021-06-18 DIAGNOSIS — J449 Chronic obstructive pulmonary disease, unspecified: Secondary | ICD-10-CM | POA: Diagnosis not present

## 2021-06-22 NOTE — Progress Notes (Signed)
Remote ICD transmission.   

## 2021-06-27 ENCOUNTER — Telehealth: Payer: Self-pay | Admitting: *Deleted

## 2021-06-27 ENCOUNTER — Telehealth: Payer: Medicare Other

## 2021-06-27 NOTE — Telephone Encounter (Signed)
°  Care Management   Follow Up Note   06/27/2021 Name: [name] MRN: [id number] DOB: May 28, 1951   Referred by: [name], MD Reason for referral : Chronic Care Management (DM2, COPD)   An unsuccessful telephone outreach was attempted today. The patient was referred to the case management team for assistance with care management and care coordination.   Follow Up Plan: Telephone follow up appointment with care management team member scheduled for: upon care guide rescheduling.  Jacqlyn Larsen RNC, BSN RN Case Manager [facility] [phone]

## 2021-06-28 ENCOUNTER — Other Ambulatory Visit: Payer: Self-pay | Admitting: Radiation Oncology

## 2021-06-28 DIAGNOSIS — C3412 Malignant neoplasm of upper lobe, left bronchus or lung: Secondary | ICD-10-CM

## 2021-06-28 NOTE — Progress Notes (Signed)
° °                                                                                                                                                          °  Patient Name: John [name] MRN: [id number] DOB: 02-Feb-1951 Referring Physician: June Leap Date of Service: 06/14/2021 [facility], [place]                                                        End Of Treatment Note  Diagnoses: C34.12-Malignant neoplasm of upper lobe, left bronchus or lung  Cancer Staging: Stage IA3, cT1cN0M0, NSCLC, favor adenosquamous carcinoma of the LUL  Intent: Curative  Radiation Treatment Dates:  06/07/2021 through 06/14/2021 SBRT Treatment Site Technique Total Dose (Gy) Dose per Fx (Gy) Completed Fx Beam Energies  Lung, LUL IMRT 54/54 18 3/3 [id number]   Narrative: The patient tolerated radiation therapy relatively well.   Plan: The patient will receive a call in about one month from the radiation oncology department. He will continue follow up in our clinic and orders will be placed for restaging CT in 6-8 weeks. ________________________________________________    Carola Rhine, PAC

## 2021-06-30 ENCOUNTER — Other Ambulatory Visit: Payer: Self-pay | Admitting: Cardiovascular Disease

## 2021-06-30 ENCOUNTER — Encounter: Payer: Self-pay | Admitting: Cardiovascular Disease

## 2021-06-30 ENCOUNTER — Other Ambulatory Visit: Payer: Self-pay

## 2021-06-30 ENCOUNTER — Ambulatory Visit (INDEPENDENT_AMBULATORY_CARE_PROVIDER_SITE_OTHER): Payer: Medicare Other | Admitting: Cardiovascular Disease

## 2021-06-30 VITALS — BP 145/87 | HR 81 | Ht 72.0 in | Wt 216.2 lb

## 2021-06-30 DIAGNOSIS — I255 Ischemic cardiomyopathy: Secondary | ICD-10-CM | POA: Diagnosis not present

## 2021-06-30 DIAGNOSIS — R911 Solitary pulmonary nodule: Secondary | ICD-10-CM

## 2021-06-30 DIAGNOSIS — I82622 Acute embolism and thrombosis of deep veins of left upper extremity: Secondary | ICD-10-CM

## 2021-06-30 DIAGNOSIS — Z95810 Presence of automatic (implantable) cardiac defibrillator: Secondary | ICD-10-CM

## 2021-06-30 DIAGNOSIS — I2510 Atherosclerotic heart disease of native coronary artery without angina pectoris: Secondary | ICD-10-CM

## 2021-06-30 DIAGNOSIS — H9041 Sensorineural hearing loss, unilateral, right ear, with unrestricted hearing on the contralateral side: Secondary | ICD-10-CM | POA: Diagnosis not present

## 2021-06-30 DIAGNOSIS — J849 Interstitial pulmonary disease, unspecified: Secondary | ICD-10-CM

## 2021-06-30 DIAGNOSIS — I63139 Cerebral infarction due to embolism of unspecified carotid artery: Secondary | ICD-10-CM

## 2021-06-30 DIAGNOSIS — Z79899 Other long term (current) drug therapy: Secondary | ICD-10-CM

## 2021-06-30 DIAGNOSIS — I472 Ventricular tachycardia, unspecified: Secondary | ICD-10-CM | POA: Diagnosis not present

## 2021-06-30 DIAGNOSIS — G4733 Obstructive sleep apnea (adult) (pediatric): Secondary | ICD-10-CM

## 2021-06-30 DIAGNOSIS — Z950 Presence of cardiac pacemaker: Secondary | ICD-10-CM | POA: Diagnosis not present

## 2021-06-30 DIAGNOSIS — D333 Benign neoplasm of cranial nerves: Secondary | ICD-10-CM | POA: Diagnosis not present

## 2021-06-30 MED ORDER — METOPROLOL SUCCINATE ER 50 MG PO TB24: 50.0000 mg | ORAL_TABLET | Freq: Every day | ORAL | 6 refills | Status: DC

## 2021-06-30 MED ORDER — ENTRESTO 49-51 MG PO TABS: 1.0000 | ORAL_TABLET | Freq: Two times a day (BID) | ORAL | 6 refills | Status: DC

## 2021-06-30 NOTE — Progress Notes (Signed)
Patient ID: John [name], male   DOB: 07-02-1950, 71 y.o.   MRN: [id number]     HPI:  John [name] is a 71 y.o. male who is a former patient of Dr. Rollene [name]. He presents for a 7 month cardiology followup evaluation.   John [name] has known CAD and underwent multiple percutaneous cardiac interventions with stenting to circumflex and RCA. His last cardiac catheterization was done by Dr. Gwenlyn [name] in September 2013 showed his RCA stented patent but he had 40% narrowing in the very distal aspect. He has a history of hypertension, as well as long-standing tobacco use with mild polycythemia. I had seen him in a sleep clinic after he obtained a new CPAP machine.  He has severe sleep apnea with an AHI on his initial diagnostic study of 44 per hour overall and 72.6 per hour with REM sleep.  A download last year  showed excellent benefit from CPAP with an AHI of 3.1.  His initial stent to his RCA was in 1996 was a Cypher PS 1540 sent. In 1999 he underwent non-DES stenting to circumflex after he presented with a posterior MI. He had additional 4-5 stents placed in his RCA.  He has a history of prior back surgery and has had a left foot drop.  He has significant obstructive sleep apnea and continues to utilize CPAP therapy.  When I saw him in the past despite CPAP therapy he complained of fatigue and residual daytime sleepiness and I suggested the possibility of Nuvigil or Provigil if necessary.    Remotely he was seen  by Dr. Zackery [name] complaints of  atypical chest pain.  There was some concern that this may be reflux and pantoprazole was discontinued and replaced with dexilant. However, it was felt that the patient may require another stress test since his last evaluation was in 2012.  He also had had recent issues with some weight gain and shortness of breath and his intermittent Lasix dose changed to 40 mg daily.  On 05/06/2015 he underwent a nuclear stress test which was interpreted as a high risk  study due to an ejection fraction of 31%.  There was a large fixed inferior and inferoseptal wall defect suggestive of scar and evidence for inferoseptal akinesis.  There was no ischemia.   On his prior nuclear study the EF in October 2012 was 38%.  An echo Doppler study done the same day however, showed discordant data and his ejection fraction was 50-55%.  Hypokinesis of the inferior inferolateral wall was noted.  There was grade 1 diastolic dysfunction.  There was mild aortic root dilatation at 41 mm.  His left atrium was severely dilated.  His right atrium was severely dilated.  There was trivial TR.  Of note, his last echo Doppler study had shown an EF of 45-50%.  When I  saw him in November 2017  he stated that he has been using CPAP.  However, for the past month he has noticed significant increased fatigue.  He was also [name] to have elevated sugars and his insulin was adjusted.  He admits to development of some trace ankle swelling, left greater than right.  He denied any episodes of chest pain.  He was unaware of spells of tachycardia.  He denied presyncope or syncope.  During that evaluation, he was [name] to be in atrial flutter with variable block at 74 bpm of questionable duration.  At that time, I recommended that he discontinue Plavix and started him on eliquis  5 mg and further titrated his Bystolic to 10 mg.  His [id number] is at least 4 and with his CAD he was advised to continue aspirin 81 mg.  He underwent an echo Doppler study on 06/02/2016 which showed an EF of 40-45% with moderate diffuse hypokinesis.  While they are, mild aortic sclerosis and increased atrial septal thickness consistent with lipomatous hypertrophy.  I obtained a download of his CPAP unit from 02/09/2016 through 05/08/2016 and he was continuing to meet Medicare compliance with 91% of days of usage and 79% with usage greater than 4 hours.  He was averaging 5 hours and 55 minutes per night.  AHI was 3.7.  However, he had a  very large leak.  I have recommended that he get a new mask from his DME company.    He underwent successful cardioversion for atrial flutter.  He is unaware of any recurrent arrhythmia.  He continues to be on amiodarone, and Bystolic.  He is on eliquis for anticoagulation.  He has had difficulty with pain in the center of his back.  He has seen [name] and had undergone an MRI and was told of having disc disease.  He has noticed some shortness of breath.  He had undergone PFTs by [name] and was not [name] to have significant COPD or restrictive lung disease. [name]  He continues to be on amiodarone.  He has been on [id number] mg  And bystolic for hypertension and rosuvastatin 20 mg for hyperlipidemia.  Unfortunately he is still smoking at least a half a pack per day and is smoked for over 55 years.  Continues to use CPAP with 100% compliance.  He is diabetic on insulin.  He is diabetic on insulin.  He is retired as a Chartered certified accountant for [employer]. He had experienced some back discomfort.  He denies any classic exertional chest tightness.    An echo Doppler study on 04/26/2017 which showed an EF of 40-45% with grade 1 diastolic dysfunction.  There was borderline aortic root dilatation.  PA pressure was 31 mm.  He had mild LA and mild to moderate RA .  A follow-up nuclear perfusion study on 04/27/2017 continued to be low risk and showed findings consistent with prior inferolateral myocardial infarction.  The EF on the nuclear study was less than the echo at 34%.   He has had issues with shortness of breath and has been under evaluation by [name].  Fortunately, he finally quit tobacco in February 2019.  He is felt to have interstitial lung disease. Pulmonary function studies have shown restriction with low DLCO.  He felt possibly also to have asthma based on nitric oxide testing.  With his lung disease it has been recommended that amiodarone be considered for discontinuance.  He has had issues with  cough.  His ACE inhibitor was ultimately changed by [name] and he was started on low-dose Entresto at 24/26 mm twice a day in light of his reduce LV function.   When I saw him in June 2019 due to his lung disease, I recommended discontinuance of amiodarone.  I recommended that if his heart rate increased to greater than 60 bpm should increase Bystolic to 7.5 mg from his current dose at 5 mg.  I had a long discussion with him regarding Delene Loll importance of therapy and recommended further titration to 49/51 mg twice a day.  He continues to use CPAP therapy mid to 100% compliance.  He has felt well with Entresto.  He is He is seen  by Dr. Abigail Miyamoto for pulmonary follow-up.   When I saw him in August 2019 and discussed further titration of Entresto to 97/103 mg twice a day but he preferred to stay on the 49/5100 regimen.  His blood pressure was stable.  He continue to use CPAP with 100% compliance.   I saw him in December 2019.  At that time his shortness of breath was significantly improved which was contributed by his ultimate quitting of smoking in February 2019 as well as his discontinuance of amiodarone.  He denies PND orthopnea.  He denies recent wheezing.  However, his blood sugar has not been well controlled.  Recent hemoglobin A1c was 10.0.  He has been on insulin.  He continues to tolerate Entresto 49/51 mg twice a day in addition to Bystolic 5 mg.  He is on rosuvastatin 20 mg.  Laboratory has shown an LDL cholesterol excellent at 60.  He continues to be on Singulair and as needed albuterol.    On August 19, 2018 he underwent a follow-up echo Doppler study.  EF was now 50 to 55%.  There was moderate dilation of his left ventricle and evidence for mild inferior/inferolateral hypokinesis.  There was mild pulmonary hypertension.  He felt well   and denied chest pain, PND or orthopnea.   Since I saw him, he was evaluated by Loma Sousa, PA in August 2020 and continued to feel well without chest pain or  shortness of breath. A high resolution CT obtained in May 2020 revealed a new 1 cm pulmonary nodule in the left upper lobe and stable benign 6 mm pulmonary nodule in the right middle lobe.  He subsequently had follow-up with [name] September 2020 and in February 2021 was seen by Dr. Valeta Harms with stable respiratory symptoms.  I last saw him in April 2001 and since his prior evaluation he had felt weak and also noted some dizziness during a period of overexertion.. He has experienced exertional shortness of breath. He also admits to trace edema of his ankles. He denies chest pain PND orthopnea. He has been on Bystolic 5 mg daily, Entresto 49/51 mg twice daily, Jardiance 25 mg daily for his devious LV dysfunction. He denies recent wheezing.  He was hospitalized on September 11 through March 02, 2020 when he will was admitted with lightheadedness and low blood pressure.  He then developed left-sided chest pain with neck radiation, took nitroglycerin which further dropped his blood pressure improved his chest pain.  CTA of his chest showed aneurysmal dilatation of 4.1 cm of the ascending thoracic aorta.  His ECG showed new lateral precordial T wave inversion with his old inferior infarct.  He was mild troponin elevation with peak at 115.  He ultimately underwent repeat cardiac catheterization by Dr. Ellyn Hack and was [name] to have mild in-stent restenosis throughout the previously proximal to distal RCA stent with 50% lesions prior to and after the stents.  DFR was negative.  He had tandem 50 to 40% lesions in the ostial/proximal circumflex and DFR FFR were negative.  During his hospitalization his Entresto dose was reduced to 24/26.  I saw him in September 2021 and since his hospitalization he has felt well.  He denies any further lightheadedness or episodes of chest pain.  He has continued to be on low-dose aspirin and Eliquis.  He is now on Entresto 24/26 twice daily, nebivolol 2.5 mg daily.  He is on  rosuvastatin 20 mg for hyperlipidemia.  He  is on Jardiance and Levemir for his diabetes.    He presented to [facility] on August 18, 2020 with chest pain, palpitations, weakness, and shortness of breath.  He was [name] to be in wide-complex tachycardia felt to be due to ventricular tachycardia, hypotensive and he was converted in the emergency room he was given amiodarone bolus and drip as well as lidocaine bolus.  Ultimately amiodarone was stopped secondary to bradycardia.  He underwent repeat cardiac catheterization on August 19, 2020 which did not reveal any significant progressive CAD with 60% ramus intermediate stenosis, 40% proximal RCA stenosis, patent RCA stent and 45% distal RCA narrowing after the stented segment.  He underwent ICD implantation on August 20, 2020.  Subsequently he has been on seen by Dr. Quentin Ore for follow-up EP office visit and on September 23, 2020 by Tommye Standard, PA-C after he was [name] on September 19, 2020 to have VT/ATP x2 and was brought into the EP office for subsequent evaluation.  At that time, strips were reviewed with Dr. Curt Bears and his metoprolol was increased to 100 mg twice a day and mexiletine 150 mg twice a day was added to his medical regimen.  Felt that he had 2 types of ventricular tachycardia, 1 VT that is nearly identical to his native EGM and his other tachycardia had previously been listed as SVT.  I last saw him on September 28, 2020 at which time he felt well and denied any chest pain.  He was unaware of any recurrent palpitations since his medication adjustment.  He denied any episodes of presyncope or syncope.  There was no shortness of breath.    Since I last saw him, he has had recurrent issues.  He ultimately underwent VT ablation on January 18, 2021.  Later that month he developed osteomyelitis of his left foot and underwent amputation of his left second toe on February 10, 2021.  He was readmitted to the hospital on August 29 through February 18, 2021 with a non-STEMI.   Echo showed EF 40 to 45% with multiple wall motion abnormalities.  Repeat catheterization was performed which showed total occlusion of a large distal RCA at the site of prior stenting.  The LAD had mild irregularity without significant stenoses.  There was no change in the previously noted 55% intermediate vessel stenosis.  He underwent a very difficult but successful PCI requiring PTCA, multiple dilatations with a score flex Cutting Balloon and ultimate insertion of a 2.75 x 22 mm Onyx frontiers stent distal to the previously placed stent in overlapping fashion to cover the 75 to 80% stenosis beyond the stented segment.  Following the procedure, he developed a subtle left facial droop with left arm weakness.  A code stroke was activated.  A head CT did not reveal any acute findings.  MRI of his brain did confirm small infarct in the right thalamus with small punctate areas of acute infarct in the frontal lobes bilaterally.  He was seen by neurology and initially was on triple therapy with aspirin/Plavix and Eliquis.  The patient also has been [name] to have lung nodule and there were plans for biopsy but he subsequently developed recurrent VTE.  He has had multiple ICD shocks leading to readmission on October 30 until April 29, 2021.  An echo Doppler study on April 17, 2021 showed an EF of 25 to 30% with severe hypokinesis of the basal mid inferoseptal wall and lateral wall of the left ventricle.  There was mild dilation of his  aortic root at 38 mm and mild dilatation of his ascending aorta at 43 mm.  In addition, he also developed a DVT in the left axillary vein documented by venous ultrasound and Eliquis was increased for treatment dose of his acute DVT.  He was last seen in follow-up in the office by Sande Rives on May 05, 2021.  Presently, he denies any chest pain.  He has been wearing a left eye patch since his left eye l has medial downward deviation.  He denies any chest pain.  He is  unaware of recurrent VT or defibrillator shocks.  He has been wearing a left foot brace secondary to foot drop.  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  A download from December 14 through June 30, 2021 showed an AHI of 2.1 but compliance was very poor with usage at 2 hours and 57 minutes per night.  He presents for follow-up evaluation.   Past Medical History:  Diagnosis Date   Arm DVT (deep venous thromboembolism), acute, left (HCC)    while off eliquis for bronchoscopy 2022   Atrial flutter (HCC)    s/p cardioversion   Coronary artery disease    Diabetes mellitus    GERD (gastroesophageal reflux disease)    History of nuclear stress test 04/04/2011   lexiscan; mod-large in size fixed inferolateral defect (scar); non-diagnostic for ischemia; low risk scan    Hyperlipidemia    Hypertension    Ischemic cardiomyopathy    Left foot drop    r/t past disk srugery - uses Kevlar brace   Lung cancer ([place]) 04/26/2021   Myocardial infarction [facility])    posterior MI   Osteomyelitis ([place])    s/p left 2nd toe amputation in 01/2021   Pulmonary nodule    Recurrent ventricular tachycardia    Shortness of breath    Sleep apnea    on CPAP; 04/28/2007 split-night - AHI during total sleep 44.43/hr and REM 72.56/hr    Past Surgical History:  Procedure Laterality Date   AMPUTATION TOE Left 02/10/2021   Procedure: AMPUTATION  LEFT SECOND TOE;  Surgeon: Edrick Kins, DPM;  Location: [place];  Service: Podiatry;  Laterality: Left;   [place]   BRONCHIAL BIOPSY  04/26/2021   Procedure: BRONCHIAL BIOPSIES;  Surgeon: Garner Nash, DO;  Location: [place] [facility];  Service: Pulmonary;;   BRONCHIAL BRUSHINGS  04/26/2021   Procedure: BRONCHIAL BRUSHINGS;  Surgeon: Garner Nash, DO;  Location: [place] [facility];  Service: Pulmonary;;   BRONCHIAL NEEDLE ASPIRATION BIOPSY  04/26/2021   Procedure: BRONCHIAL NEEDLE ASPIRATION  BIOPSIES;  Surgeon: Garner Nash, DO;  Location: [place];  Service: Pulmonary;;   CARDIAC CATHETERIZATION  2010   6 stents total   CARDIAC CATHETERIZATION  01/2000   percutaneous transluminal coronary balloon angioplasty of mid RCA stenotic lesion   CARDIAC CATHETERIZATION  06/2006   no stenting; ischemic cardiomyopathy, EF 40-45%   CARDIOVERSION N/A 07/28/2016   Procedure: CARDIOVERSION;  Surgeon: Troy Sine, MD;  Location: [place];  Service: Cardiovascular;  Laterality: N/A;   CORONARY ANGIOPLASTY  09/1998   mid-distal RCA balloon dilatation, 4.5 & 5.0 stents    CORONARY ANGIOPLASTY WITH STENT PLACEMENT  03/1994   angioplasty & stenting (non-DES) of circumflex/prox ramus intermedius   CORONARY ANGIOPLASTY WITH STENT PLACEMENT  10/1994   large iliac [id number] stent to RCA   CORONARY ANGIOPLASTY WITH  STENT PLACEMENT  12/2002   4.22m stents x2 of RCA   CORONARY ANGIOPLASTY WITH STENT PLACEMENT  01/2005   cutting balloon arthrectomy of distal RCA & Cypher DES 3.5x13; cutting balloon arthrectomy of mid RCA with Cypher DES 3.5x18   CORONARY ANGIOPLASTY WITH STENT PLACEMENT  11/2008   stenting of mid RCA with 4.0x182mdriver, non-DES   CORONARY BALLOON ANGIOPLASTY N/A 02/15/2021   Procedure: CORONARY BALLOON ANGIOPLASTY;  Surgeon: KeTroy SineMD;  [facility];  Service: Cardiovascular;  Laterality: N/A;   FIDUCIAL MARKER PLACEMENT  04/26/2021   Procedure: FIDUCIAL MARKER PLACEMENT;  Surgeon: IcGarner NashDO;  [facility];  Service: Pulmonary;;   ICD IMPLANT N/A 08/20/2020   Procedure: ICD IMPLANT;  Surgeon: CaConstance HawMD;  [facility];  Service: Cardiovascular;  Laterality: N/A;   INTRAVASCULAR PRESSURE WIRE/FFR STUDY N/A 03/02/2020   Procedure: INTRAVASCULAR PRESSURE WIRE/FFR STUDY;  Surgeon: HaLeonie ManMD;  [facility];  Service: Cardiovascular;  Laterality: N/A;   LEFT HEART CATH AND CORONARY  ANGIOGRAPHY N/A 03/02/2020   Procedure: LEFT HEART CATH AND CORONARY ANGIOGRAPHY;  Surgeon: HaLeonie ManMD;  [facility];  Service: Cardiovascular;  Laterality: N/A;   LEFT HEART CATH AND CORONARY ANGIOGRAPHY N/A 08/19/2020   Procedure: LEFT HEART CATH AND CORONARY ANGIOGRAPHY;  Surgeon: BeLorretta HarpMD;  [facility];  Service: Cardiovascular;  Laterality: N/A;   LEFT HEART CATH AND CORONARY ANGIOGRAPHY N/A 02/15/2021   Procedure: LEFT HEART CATH AND CORONARY ANGIOGRAPHY;  Surgeon: KeTroy SineMD;  [facility];  Service: Cardiovascular;  Laterality: N/A;   LEFT HEART CATHETERIZATION WITH CORONARY ANGIOGRAM N/A 02/27/2012   Procedure: LEFT HEART CATHETERIZATION WITH CORONARY ANGIOGRAM;  Surgeon: JoLorretta HarpMD;  [facility];  Service: Cardiovascular;  Laterality: N/A;   TRANSTHORACIC ECHOCARDIOGRAM  07/29/2010   EF 50=55%, mod inf wall hypokinesis & mild post wall hypokinesis; LA mild-mod dilated; mild mitral annular calcif & mild MR; mild TR & elevated RV systolic pressure; AV mildly sclerotic; mild aortic root dilatation    V TACH ABLATION N/A 01/20/2021   Procedure: V TACH ABLATION;  Surgeon: LaVickie EpleyMD;  [facility];  Service: Cardiovascular;  Laterality: N/A;   VIDEO BRONCHOSCOPY WITH ENDOBRONCHIAL NAVIGATION Left 04/26/2021   Procedure: VIDEO BRONCHOSCOPY WITH ENDOBRONCHIAL NAVIGATION;  Surgeon: IcGarner NashDO;  [facility] Service: [facility];  Laterality: Left;  ION w/ fiducial   VIDEO BRONCHOSCOPY WITH RADIAL ENDOBRONCHIAL ULTRASOUND  04/26/2021   Procedure: RADIAL ENDOBRONCHIAL ULTRASOUND;  Surgeon: IcGarner NashDO;  [facility];  Service: Pulmonary;;    Allergies  Allergen Reactions   Omega-3 Fatty Acids Hives and Itching   Benazepril Other (See Comments)    hyperkalemia   Fish Allergy Itching    Current Outpatient Medications  Medication Sig  Dispense Refill   albuterol (VENTOLIN HFA) 108 (90 Base) MCG/ACT inhaler Inhale 2 puffs into the lungs every 6 (six) hours as needed for wheezing or shortness of breath. 1 each 6   amiodarone (PACERONE) 200 MG tablet Take 1 tablet (200 mg total) by mouth daily. 90 tablet 3   apixaban (ELIQUIS) 5 MG TABS tablet Take 1 tablet (5 mg total) by mouth 2 (two) times daily. 180 tablet 1   clopidogrel (PLAVIX) 75 MG tablet Take 1 tablet (75 mg total) by mouth daily  with breakfast.     Fluticasone Furoate (ARNUITY ELLIPTA) 200 MCG/ACT AEPB Inhale 1 puff into the lungs daily. 30 each 6   insulin aspart (NOVOLOG FLEXPEN) 100 UNIT/ML FlexPen INJECT 5-20 UNITS SUBCUTANEOUSLY WITH LUNCH AND DINNER (Patient taking differently: Inject 6-8 Units into the skin daily as needed for high blood sugar.) 15 mL 1   Insulin Pen Needle (NOVOFINE) 32G X 6 MM MISC 1 each by Other route daily. 100 each 3   JARDIANCE 25 MG TABS tablet TAKE 1 TABLET BY MOUTH EVERY DAY 30 tablet 5   LEVEMIR FLEXTOUCH 100 UNIT/ML FlexTouch Pen INJECT 46 UNITS INTO THE SKIN DAILY. DX:E11.9 15 mL 3   metoprolol succinate (TOPROL-XL) 50 MG 24 hr tablet Take 1 tablet (50 mg total) by mouth daily. Take with or immediately following a meal. 30 tablet 6   mexiletine (MEXITIL) 150 MG capsule Take 2 capsules (300 mg total) by mouth every 12 (twelve) hours. 120 capsule 5   montelukast (SINGULAIR) 10 MG tablet TAKE 1 TABLET BY MOUTH EVERY DAY (Patient taking differently: Take 10 mg by mouth daily.) 90 tablet 3   nitroGLYCERIN (NITROSTAT) 0.4 MG SL tablet PLACE 1 TABLET (0.4 MG TOTAL) UNDER THE TONGUE EVERY 5 (FIVE) MINUTES AS NEEDED FOR CHEST PAIN. 25 tablet 1   ONETOUCH ULTRA test strip USE TO CHECK BLOOD SUGAR 3 TIMES A DAY (E11.9) 100 strip 2   rosuvastatin (CRESTOR) 40 MG tablet Take 1 tablet (40 mg total) by mouth daily. 90 tablet 1   sacubitril-valsartan (ENTRESTO) 49-51 MG Take 1 tablet by mouth 2 (two) times daily. 60 tablet 6   spironolactone  (ALDACTONE) 25 MG tablet Take 1 tablet (25 mg total) by mouth every other day. 45 tablet 1   No current facility-administered medications for this visit.    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 [employer]  Tobacco Use   Smoking status: Former    Packs/day: 1.00    Years: 50.00    Pack years: 50.00    Types: Cigarettes    Quit date: 07/20/2016    Years since quitting: 4.9   Smokeless tobacco: Never  Vaping Use   Vaping Use: Never used  Substance and Sexual Activity   Alcohol use: Not Currently    Alcohol/week: 0.0 standard drinks   Drug use: No   Sexual activity: Yes  Other Topics Concern   Not on file  Social History Narrative   Not on file   Social Determinants of Health   Financial Resource Strain: Not on file  Food Insecurity: No Food Insecurity   Worried About Running Out of Food in the Last Year: Never true   Ran Out of Food in the Last Year: Never true  Transportation Needs: No Transportation Needs   Lack of Transportation (Medical): No   Lack of Transportation (Non-Medical): No  Physical Activity: Not on file  Stress: Not on file  Social Connections: Not on file  Intimate Partner Violence: Not At Risk   Fear of Current or Ex-Partner: No   Emotionally Abused: No   Physically Abused: No   Sexually Abused: No   Social history is known that he previously worked as  a Freight forwarder for [employer]. There is a long-standing tobacco history. He rarely drinks alcohol.  Family History  Problem Relation Age of Onset   Bone cancer Mother    Heart attack Father    ROS General: Negative; No fevers, chills, or night sweats; Positive for fatigue  HEENT: Positive for loss of hearing in his right ear Pulmonary: Had evaluation  for possible interstitial lung disease; this of breath improved. History of lung nodules being followed by [name] and Dr. Valeta Harms Cardiovascular: see history of present illness Positive for possible claudication GI: Negative; No nausea, vomiting, diarrhea, or abdominal pain GU: Negative; No dysuria, hematuria, or difficulty voiding Musculoskeletal: Negative; no myalgias, joint pain, or weakness Hematologic/Oncology: Negative; no easy bruising, bleeding Endocrine: Positive for diabetes mellitus Neuro: Thalamic infarct Skin: Negative; No rashes or skin lesions Psychiatric: Negative; No behavioral problems, depression Sleep: Positive for obstructive sleep apnea.  Mild residual daytime sleepiness; no bruxism, restless legs, hypnogognic hallucinations, no cataplexy Other comprehensive 14 point system review is negative.   PE BP (!) 145/87    Pulse 81    Ht 6' (1.829 m)    Wt 216 lb 3.2 oz (98.1 kg)    SpO2 96%    BMI 29.32 kg/m    Repeat blood pressure by me was 124/76  Wt Readings from Last 3 Encounters:  07/08/21 215 lb 12.8 oz (97.9 kg)  06/30/21 216 lb 3.2 oz (98.1 kg)  05/30/21 217 lb (98.4 kg)   General: Alert, oriented, no distress.  Skin: normal turgor, no rashes, warm and dry HEENT: Normocephalic, atraumatic.  Wearing left eye patch due to advanced bilateral dilation Nose without nasal septal hypertrophy Mouth/Parynx benign; Mallinpatti scale 3 Neck: No JVD, no carotid bruits; normal carotid upstroke Lungs: clear to ausculatation and percussion; no wheezing or rales Chest wall: without tenderness to palpitation Heart: PMI not displaced, RRR, s1 s2 normal, 1/6 systolic murmur, no diastolic murmur, no rubs, gallops, thrills, or heaves Abdomen: soft, nontender; no hepatosplenomehaly, BS+; abdominal aorta nontender and not dilated by palpation. Back: no CVA tenderness Pulses 2+ Musculoskeletal: full range of motion, normal strength, no joint deformities Extremities:  Wearing left foot brace secondary to.   Homan's sign negative  Neurologic: grossly nonfocal; Cranial nerves grossly wnl Psychologic: Normal mood and affect  June 30, 2021 ECG (independently read by me): Atrial paced 81; inferior Q waves  September 28, 2020 ECG (independently read by me): A paced at 74; PR 292 msec  September 24. 2021 ECG (independently read by me): Sinus bradycardia 53 bpm.  Inferior Q waves.  Lateral ST-T changes.  QTc interval 412 ms  April 2021 ECG (independently read by me): Sinus bradycardia 55 bpm.  Inferior Q waves consistent with old inferior MI   August 29, 2018 ECG (independently read by me): Normal sinus rhythm at 63 bpm.  Old inferior infarct.  No ST segment changes.  Normal intervals.  December 2019 ECG (independently read by me): Sinus bradycardia  at 56; Old inferior MI  August 2019 ECG (independently read by me): Sinus bradycardia 50.  Old inferior infarct with Q waves fairly  June 2019 ECG (independently read by me): Sinus bradycardia at 47 bpm.  Inferior Q waves.  QTc interval 472 ms.  No ectopy.  December 2018 ECG (independently read by me): Sinus bradycardia 53 bpm.  Q waves inferiorly, QTc interval 463 ms.  November 2017 ECG (independently read by me): Atrial fibrillation at 57 bpm.  Inferior Q waves.  An small inferolateral  Q waves.  QTc interval 418 ms.  06/05/2016 ECG (independently read by me): Atrial flutter with a rate of 79 bpm with variable block.  QTc interval 467 ms.  November 2017 ECG (independently read by me): Atrial flutter with variable block, ventricular rate at 74.  LVH by voltage criteria in aVL.  Inferior Q waves concordant with prior MI.  May 2017 ECG (independently read by me): Sinus bradycardia at 51 bpm.  Old inferior infarct pattern with prominent inferior lateral Q waves  05/03/2015 ECG (independently read by me): Sinus bradycardia 54 bpm with PVC.  Inferior and lateral Q waves concordant with inferior lateral MI  ECG  (independently read by me): Sinus bradycardia 55 bpm.  Inferior Q waves compatible with old inferior MI.  No significant ST segment changes.  January 2015 ECG (independently read by me): Normal sinus rhythm at 75 beats per minute. Old inferior infarction with inferior Q waves. No other ST-T changes  LABS:  BMP Latest Ref Rng & Units 06/30/2021 05/30/2021 05/05/2021  Glucose 70 - 99 mg/dL 145(H) 216(H) 103(H)  BUN 8 - 27 mg/dL _0 Creatinine 0.76 - 1.27 mg/dL 1.05 0.94 1.15  BUN/Creat Ratio 10 - _1 Sodium 134 - 144 mmol/L 143 143 147(H)  Potassium 3.5 - 5.2 mmol/L 4.0 4.3 4.6  Chloride 96 - 106 mmol/L 107(H) 108(H) 108(H)  CO2 20 - 29 mmol/L _2 Calcium 8.6 - 10.2 mg/dL 9.3 8.7 9.2   Hepatic Function Latest Ref Rng & Units 06/30/2021 05/30/2021 04/18/2021  Total Protein 6.0 - 8.5 g/dL 6.1 6.0 5.3(L)  Albumin 3.7 - 4.7 g/dL 3.8 3.9 3.0(L)  AST 0 - 40 IU/L _3 ALT 0 - 44 IU/L _4 Alk Phosphatase 44 - 121 IU/L 130(H) 111 71  Total Bilirubin 0.0 - 1.2 mg/dL 0.5 0.4 0.5  Bilirubin, Direct - - - -    CBC Latest Ref Rng & Units 06/30/2021 05/30/2021 05/05/2021  WBC 3.4 - 10.8 x10E3/uL 7.8 7.7 8.7  Hemoglobin 13.0 - 17.7 g/dL 16.1 16.5 17.5  Hematocrit 37.5 - 51.0 % 46.8 49.8 52.6(H)  Platelets 150 - 450 x10E3/uL 160 162 254   Lab Results  Component Value Date   MCV 88 06/30/2021   MCV 91 05/30/2021   MCV 90 05/05/2021   Lab Results  Component Value Date   TSH 1.920 05/30/2021    BNP    Component Value Date/Time   PROBNP 1,179 (H) 06/30/2021 0908    Lipid Panel     Component Value Date/Time   CHOL 120 04/18/2021 0133   CHOL 115 01/24/2019 0930   TRIG 80 04/18/2021 0133   HDL 30 (L) 04/18/2021 0133   HDL 33 (L) 01/24/2019 0930   CHOLHDL 4.0 04/18/2021 0133   VLDL 16 04/18/2021 0133   LDLCALC 74 04/18/2021 0133   LDLCALC 50 09/15/2019 1538     RADIOLOGY: No results [name].  IMPRESSION:  1. Ischemic cardiomyopathy   2. Coronary  artery disease involving native coronary artery of native heart without angina pectoris   3. Obstructive sleep apnea   4. VT (ventricular tachycardia)   5. S/P ICD (internal cardiac defibrillator) procedure   6. Acute deep vein thrombosis (DVT) of left upper extremity, unspecified vein (HCC):04/29/2021   7. Interstitial lung disease ([place])   8. Lung nodule   9. Cerebrovascular accident (CVA) due to embolism, unspecified blood vessel laterality ([place])   10. Medication management  ASSESSMENT AND PLAN: Mr. Dragan is a 71 year-old Caucasian male who has CAD and has undergone prior extensive stenting to his RCA and also stenting to his circumflex vesse dating back to 62.  An echo Doppler study in 2013 showed ejection fraction of 45-50%. There was severe inferior, inferolateral and inferoseptal hypokinesis in the basal inferolateral wall segments consistent with scar. A nuclear perfusion study in November 2016 showed a large area of inferior scar without associated ischemia.  The ejection fraction on the nuclear study was reduced at 31%, making the scan, a high-risk study. His echo Doppler study done the same day showed discordant data with an EF of 50%, but also with inferior hypokinesis.  The echo Doppler study in November 2018 showed an EF of 40 to 45% with mild LVH, grade 1 diastolic dysfunction, and old inferior scar.  PA pressure was 31 mm.  He has had significant issues with recurrent ventricular tachycardia and has undergone VT ablation on January 20, 2021 and continued to have episodes of ventricular tachycardia despite both sotalol and mexiletine.  He subsequently developed an ACS in August 2022 with complex intervention to his RCA which had occluded but unfortunately developed a post procedure stroke documented on MRI involving the right thalamus with small punctate areas of acute infarct in the frontal lobes bilaterally for which she was followed by neurology.  He also was [name] to have left  upper lobe mass with plans for biopsy.  In addition, he developed DVT, had recurrent DVT with multiple ICD shocks.  His most recent echo on April 17 2021 revealed severe LV dysfunction with EF at 25 to 30%.  He is now on a medical regimen consisting of Entresto 24/26 mg twice a day, Jardiance 25 mg, metoprolol titrate 50 mg twice a day in addition to spironolactone 25 mg daily for guideline directed medical therapy.  He continues to be anticoagulated on full dose Eliquis 5 mg twice a day.  He is on amiodarone 20 mg daily in addition to mexiletine 300 mg twice a day.  He is diabetic on insulin in addition to his Jardiance.  He continues to be on rosuvastatin for hyperlipidemia and LDL cholesterol on April 18, 2021 was 74.  He has obstructive sleep apnea and has a very old machine which is not wireless with a set up date in June 2014.  He has had issues with recent mask leak.  I have recommended a follow-up home sleep study and will order a new CPAP machine following completion.  I will see him in 3 months for follow-up evaluation or sooner as needed.  Troy Sine, MD, [facility]  07/17/2021 4:17 PM

## 2021-06-30 NOTE — Patient Instructions (Signed)
Medication Instructions:  Stop metoprolol tartrate  START METOPROLOL SUCCINATE 50MG  DAILY  INCREASE ENTRESTO 49/51MG   TWICE DAILY  *If you need a refill on your cardiac medications before your next appointment, please call your pharmacy*  Lab Work:     CMET, CBC AND PRO-BNP TODAY      Testing/Procedures: SLEEP STUDY-SOMEONE WILL BE CALLING YOUR TO SCHEDULE  Special Instructions MAKE SURE TO KEEP YOUR SCHEDULED APPOINTMENT WITH DR Collin: Your next appointment:  3 month(s) In Person with Shelva Majestic, MD   At [facility], you and your health needs are our priority.  As part of our continuing mission to provide you with exceptional heart care, we have created designated Provider Care Teams.  These Care Teams include your primary Cardiologist (physician) and Advanced Practice Providers (APPs -  Physician Assistants and Nurse Practitioners) who all work together to provide you with the care you need, when you need it.

## 2021-07-01 ENCOUNTER — Encounter: Payer: Self-pay | Admitting: Cardiology

## 2021-07-01 LAB — CBC
Hematocrit: 46.8 % (ref 37.5–51.0)
Hemoglobin: 16.1 g/dL (ref 13.0–17.7)
MCH: 30.3 pg (ref 26.6–33.0)
MCHC: 34.4 g/dL (ref 31.5–35.7)
MCV: 88 fL (ref 79–97)
Platelets: 160 10*3/uL (ref 150–450)
RBC: 5.31 x10E6/uL (ref 4.14–5.80)
RDW: 12.7 % (ref 11.6–15.4)
WBC: 7.8 10*3/uL (ref 3.4–10.8)

## 2021-07-01 LAB — COMPREHENSIVE METABOLIC PANEL
ALT: 23 IU/L (ref 0–44)
AST: 23 IU/L (ref 0–40)
Albumin/Globulin Ratio: 1.7 (ref 1.2–2.2)
Albumin: 3.8 g/dL (ref 3.7–4.7)
Alkaline Phosphatase: 130 IU/L — ABNORMAL HIGH (ref 44–121)
BUN/Creatinine Ratio: 15 (ref 10–24)
BUN: 16 mg/dL (ref 8–27)
Bilirubin Total: 0.5 mg/dL (ref 0.0–1.2)
CO2: 21 mmol/L (ref 20–29)
Calcium: 9.3 mg/dL (ref 8.6–10.2)
Chloride: 107 mmol/L — ABNORMAL HIGH (ref 96–106)
Creatinine, Ser: 1.05 mg/dL (ref 0.76–1.27)
Globulin, Total: 2.3 g/dL (ref 1.5–4.5)
Glucose: 145 mg/dL — ABNORMAL HIGH (ref 70–99)
Potassium: 4 mmol/L (ref 3.5–5.2)
Sodium: 143 mmol/L (ref 134–144)
Total Protein: 6.1 g/dL (ref 6.0–8.5)
eGFR: 75 mL/min/{1.73_m2} (ref >59)

## 2021-07-01 LAB — PRO B NATRIURETIC PEPTIDE: NT-Pro BNP: 1179 pg/mL — ABNORMAL HIGH (ref 0–376)

## 2021-07-05 ENCOUNTER — Telehealth: Payer: Self-pay | Admitting: Cardiovascular Disease

## 2021-07-05 NOTE — Telephone Encounter (Signed)
Patient is returning call to discuss lab results. 

## 2021-07-05 NOTE — Telephone Encounter (Signed)
John Sine, MD  07/01/2021  5:27 PM EST     NT proBNP increased consistent with his heart failure.  Consider increasing Entresto to 49/51 mg twice a day.  Continue Jardiance, metoprolol, spironolactone.  He will    Spoke with patient and his Delene Loll was increased at office visit  He only takes Spironolactone to 25 mg 2, maybe 3 times a week  Stated it makes him stay in the bathroom all day and night  Advised would forward to Dr Claiborne Billings for review

## 2021-07-08 ENCOUNTER — Encounter: Payer: Self-pay | Admitting: Cardiology

## 2021-07-08 ENCOUNTER — Other Ambulatory Visit: Payer: Self-pay

## 2021-07-08 ENCOUNTER — Ambulatory Visit (INDEPENDENT_AMBULATORY_CARE_PROVIDER_SITE_OTHER): Payer: Medicare Other | Admitting: Cardiology

## 2021-07-08 VITALS — BP 130/90 | HR 88 | Ht 72.0 in | Wt 215.8 lb

## 2021-07-08 DIAGNOSIS — I5022 Chronic systolic (congestive) heart failure: Secondary | ICD-10-CM

## 2021-07-08 DIAGNOSIS — I483 Typical atrial flutter: Secondary | ICD-10-CM | POA: Diagnosis not present

## 2021-07-08 DIAGNOSIS — I2510 Atherosclerotic heart disease of native coronary artery without angina pectoris: Secondary | ICD-10-CM

## 2021-07-08 DIAGNOSIS — I255 Ischemic cardiomyopathy: Secondary | ICD-10-CM

## 2021-07-08 DIAGNOSIS — I472 Ventricular tachycardia, unspecified: Secondary | ICD-10-CM | POA: Diagnosis not present

## 2021-07-08 NOTE — Patient Instructions (Signed)
Medication Instructions:  Your physician has recommended you make the following change in your medication:  TAKE your Toprol at bedtime  *If you need a refill on your cardiac medications before your next appointment, please call your pharmacy*   Lab Work: None ordered   Testing/Procedures: None ordered   Follow-Up: At [facility], you and your health needs are our priority.  As part of our continuing mission to provide you with exceptional heart care, we have created designated Provider Care Teams.  These Care Teams include your primary Cardiologist (physician) and Advanced Practice Providers (APPs -  Physician Assistants and Nurse Practitioners) who all work together to provide you with the care you need, when you need it.  Remote monitoring is used to monitor your Pacemaker or ICD from home. This monitoring reduces the number of office visits required to check your device to one time per year. It allows [place] to keep an eye on the functioning of your device to ensure it is working properly. You are scheduled for a device check from home on 10/12/2021. You may send your transmission at any time that day. If you have a wireless device, the transmission will be sent automatically. After your physician reviews your transmission, you will receive a postcard with your next transmission date.  Your next appointment:   6 month(s)  The format for your next appointment:   In Person  Provider:   Tommye Standard, PA-C   Thank you for choosing [facility]!!   Trinidad Curet, RN [phone]

## 2021-07-08 NOTE — Progress Notes (Signed)
Electrophysiology Office Note   Date:  07/08/2021   ID:  John [name], John [name] Dec 16, 1950, MRN [id number]  PCP:  [name], MD  Cardiologist:  Claiborne Billings Primary Electrophysiologist:  Deshanda Molitor Meredith Leeds, MD    Chief Complaint: VT   History of Present Illness: John [name] is a 71 y.o. male who is being seen today for the evaluation of VT at the request of [name], MD. Presenting today for electrophysiology evaluation.  He has a history significant for coronary artery disease with multiple PCI's, ischemic cardiomyopathy, hypertension, hyperlipidemia, diabetes, OSA on CPAP, ILD, atrial flutter, tobacco abuse.  He quit smoking in 2019.  He presented to the hospital 08/18/2020 with chest pain and palpitations and was found to have a wide-complex tachycardia consistent with ventricular tachycardia.  He was carted on cardioverted in the emergency room and was started on amiodarone and lidocaine.  Amiodarone stopped due to bradycardia.  He is status post Medtronic ICD implanted 08/20/2020.  He has had multiple episodes of ventricular tachycardia.  He was initially on both sotalol and mexiletine but this did not control his ventricular tachycardia and he has since been started on amiodarone.  He presented to the hospital August 2022 with ACS.  He had ballooning of the RCA stents placed in a large AV branch.  He had a stroke at the time of procedure.  During work-up for stroke he was found to have a left upper lobe mass.  Biopsy showed adenocarcinoma.  He is currently undergoing chemotherapy and radiation.  Today, denies symptoms of palpitations, chest pain, shortness of breath, orthopnea, PND, lower extremity edema, claudication, dizziness, presyncope, syncope, bleeding, or neurologic sequela. The patient is tolerating medications without difficulties.  He has been doing well.  He has had no further ventricular arrhythmias.  He is overall happy with his control.  He is finished his  radiation therapy for his lung mass.  He is going back in 3 weeks to determine success of radiation.  He has no major complaints today.  Past Medical History:  Diagnosis Date   Arm DVT (deep venous thromboembolism), acute, left (HCC)    while off eliquis for bronchoscopy 2022   Atrial flutter (HCC)    s/p cardioversion   Coronary artery disease    Diabetes mellitus    GERD (gastroesophageal reflux disease)    History of nuclear stress test 04/04/2011   lexiscan; mod-large in size fixed inferolateral defect (scar); non-diagnostic for ischemia; low risk scan    Hyperlipidemia    Hypertension    Ischemic cardiomyopathy    Left foot drop    r/t past disk srugery - uses Kevlar brace   Lung cancer ([place]) 04/26/2021   Myocardial infarction [facility])    posterior MI   Osteomyelitis ([place])    s/p left 2nd toe amputation in 01/2021   Pulmonary nodule    Recurrent ventricular tachycardia    Shortness of breath    Sleep apnea    on CPAP; 04/28/2007 split-night - AHI during total sleep 44.43/hr and REM 72.56/hr   Past Surgical History:  Procedure Laterality Date   AMPUTATION TOE Left 02/10/2021   Procedure: AMPUTATION  LEFT SECOND TOE;  Surgeon: Edrick Kins, DPM;  Location: [place];  Service: Podiatry;  Laterality: Left;   [place]   BRONCHIAL BIOPSY  04/26/2021   Procedure: BRONCHIAL BIOPSIES;  Surgeon: Garner Nash, DO;  Location: [place] [facility];  Service: Pulmonary;;   BRONCHIAL BRUSHINGS  04/26/2021  Procedure: BRONCHIAL BRUSHINGS;  Surgeon: Garner Nash, DO;  Location: [facility];  Service: Pulmonary;;   BRONCHIAL NEEDLE ASPIRATION BIOPSY  04/26/2021   Procedure: BRONCHIAL NEEDLE ASPIRATION BIOPSIES;  Surgeon: Garner Nash, DO;  Location: [place];  Service: Pulmonary;;   CARDIAC CATHETERIZATION  2010   6 stents total   CARDIAC CATHETERIZATION  01/2000   percutaneous transluminal coronary balloon angioplasty of mid RCA stenotic lesion   CARDIAC CATHETERIZATION   06/2006   no stenting; ischemic cardiomyopathy, EF 40-45%   CARDIOVERSION N/A 07/28/2016   Procedure: CARDIOVERSION;  Surgeon: Troy Sine, MD;  Location: [place];  Service: Cardiovascular;  Laterality: N/A;   CORONARY ANGIOPLASTY  09/1998   mid-distal RCA balloon dilatation, 4.5 & 5.0 stents    CORONARY ANGIOPLASTY WITH STENT PLACEMENT  03/1994   angioplasty & stenting (non-DES) of circumflex/prox ramus intermedius   CORONARY ANGIOPLASTY WITH STENT PLACEMENT  10/1994   large iliac [id number] stent to RCA   [place]  12/2002   4.72mm stents x2 of RCA   CORONARY ANGIOPLASTY WITH STENT PLACEMENT  01/2005   cutting balloon arthrectomy of distal RCA & Cypher DES 3.5x13; cutting balloon arthrectomy of mid RCA with Cypher DES 3.5x18   CORONARY ANGIOPLASTY WITH STENT PLACEMENT  11/2008   stenting of mid RCA with 4.0x79mm driver, non-DES   CORONARY BALLOON ANGIOPLASTY N/A 02/15/2021   Procedure: CORONARY BALLOON ANGIOPLASTY;  Surgeon: Troy Sine, MD;  Location: [place] [facility];  Service: Cardiovascular;  Laterality: N/A;   FIDUCIAL MARKER PLACEMENT  04/26/2021   Procedure: FIDUCIAL MARKER PLACEMENT;  Surgeon: Garner Nash, DO;  Location: [place] [facility];  Service: Pulmonary;;   ICD IMPLANT N/A 08/20/2020   Procedure: ICD IMPLANT;  Surgeon: Constance Haw, MD;  Location: [place] [facility];  Service: Cardiovascular;  Laterality: N/A;   INTRAVASCULAR PRESSURE WIRE/FFR STUDY N/A 03/02/2020   Procedure: INTRAVASCULAR PRESSURE WIRE/FFR STUDY;  Surgeon: Leonie Man, MD;  Location: [place] [facility];  Service: Cardiovascular;  Laterality: N/A;   LEFT HEART CATH AND CORONARY ANGIOGRAPHY N/A 03/02/2020   Procedure: LEFT HEART CATH AND CORONARY ANGIOGRAPHY;  Surgeon: Leonie Man, MD;  Location: [place] [facility];  Service: Cardiovascular;  Laterality: N/A;   LEFT HEART CATH AND CORONARY ANGIOGRAPHY N/A 08/19/2020   Procedure: LEFT HEART CATH AND CORONARY  ANGIOGRAPHY;  Surgeon: Lorretta Harp, MD;  Location: [place] [facility];  Service: Cardiovascular;  Laterality: N/A;   LEFT HEART CATH AND CORONARY ANGIOGRAPHY N/A 02/15/2021   Procedure: LEFT HEART CATH AND CORONARY ANGIOGRAPHY;  Surgeon: Troy Sine, MD;  Location: [place] [facility];  Service: Cardiovascular;  Laterality: N/A;   LEFT HEART CATHETERIZATION WITH CORONARY ANGIOGRAM N/A 02/27/2012   Procedure: LEFT HEART CATHETERIZATION WITH CORONARY ANGIOGRAM;  Surgeon: Lorretta Harp, MD;  Location[facility];  Service: Cardiovascular;  Laterality: N/A;   TRANSTHORACIC ECHOCARDIOGRAM  07/29/2010   EF 50=55%, mod inf wall hypokinesis & mild post wall hypokinesis; LA mild-mod dilated; mild mitral annular calcif & mild MR; mild TR & elevated RV systolic pressure; AV mildly sclerotic; mild aortic root dilatation    V TACH ABLATION N/A 01/20/2021   Procedure: V TACH ABLATION;  Surgeon: Vickie Epley, MD;  Location: [place] [facility];  Service: Cardiovascular;  Laterality: N/A;   VIDEO BRONCHOSCOPY WITH ENDOBRONCHIAL NAVIGATION Left 04/26/2021   Procedure: VIDEO BRONCHOSCOPY WITH ENDOBRONCHIAL NAVIGATION;  Surgeon: Garner Nash, DO;  Location: [place]  ENDOSCOPY;  Service: Pulmonary;  Laterality: Left;  ION w/ fiducial   VIDEO BRONCHOSCOPY WITH RADIAL ENDOBRONCHIAL ULTRASOUND  04/26/2021   Procedure: RADIAL ENDOBRONCHIAL ULTRASOUND;  Surgeon: Garner Nash, DO;  Location: [facility];  Service: Pulmonary;;     Current Outpatient Medications  Medication Sig Dispense Refill   albuterol (VENTOLIN HFA) 108 (90 Base) MCG/ACT inhaler Inhale 2 puffs into the lungs every 6 (six) hours as needed for wheezing or shortness of breath. 1 each 6   amiodarone (PACERONE) 200 MG tablet Take 1 tablet (200 mg total) by mouth daily. 90 tablet 3   apixaban (ELIQUIS) 5 MG TABS tablet Take 1 tablet (5 mg total) by mouth 2 (two) times daily. 180 tablet 1   clopidogrel (PLAVIX) 75 MG tablet Take 1 tablet (75 mg  total) by mouth daily with breakfast.     Fluticasone Furoate (ARNUITY ELLIPTA) 200 MCG/ACT AEPB Inhale 1 puff into the lungs daily. 30 each 6   insulin aspart (NOVOLOG FLEXPEN) 100 UNIT/ML FlexPen INJECT 5-20 UNITS SUBCUTANEOUSLY WITH LUNCH AND DINNER (Patient taking differently: Inject 6-8 Units into the skin daily as needed for high blood sugar.) 15 mL 1   Insulin Pen Needle (NOVOFINE) 32G X 6 MM MISC 1 each by Other route daily. 100 each 3   JARDIANCE 25 MG TABS tablet TAKE 1 TABLET BY MOUTH EVERY DAY 30 tablet 5   LEVEMIR FLEXTOUCH 100 UNIT/ML FlexTouch Pen INJECT 46 UNITS INTO THE SKIN DAILY. DX:E11.9 15 mL 3   metoprolol succinate (TOPROL-XL) 50 MG 24 hr tablet Take 1 tablet (50 mg total) by mouth daily. Take with or immediately following a meal. 30 tablet 6   mexiletine (MEXITIL) 150 MG capsule Take 2 capsules (300 mg total) by mouth every 12 (twelve) hours. 120 capsule 5   montelukast (SINGULAIR) 10 MG tablet TAKE 1 TABLET BY MOUTH EVERY DAY (Patient taking differently: Take 10 mg by mouth daily.) 90 tablet 3   nitroGLYCERIN (NITROSTAT) 0.4 MG SL tablet PLACE 1 TABLET (0.4 MG TOTAL) UNDER THE TONGUE EVERY 5 (FIVE) MINUTES AS NEEDED FOR CHEST PAIN. 25 tablet 1   ONETOUCH ULTRA test strip USE TO CHECK BLOOD SUGAR 3 TIMES A DAY (E11.9) 100 strip 2   rosuvastatin (CRESTOR) 40 MG tablet Take 1 tablet (40 mg total) by mouth daily. 90 tablet 1   sacubitril-valsartan (ENTRESTO) 49-51 MG Take 1 tablet by mouth 2 (two) times daily. 60 tablet 6   spironolactone (ALDACTONE) 25 MG tablet Take 1 tablet (25 mg total) by mouth every other day. 45 tablet 1   No current facility-administered medications for this visit.    Allergies:   Omega-3 fatty acids, Benazepril, and Fish allergy   Social History:  The patient  reports that he quit smoking about 4 years ago. His smoking use included cigarettes. He has a 50.00 pack-year smoking history. He has never used smokeless tobacco. He reports that he does not  currently use alcohol. He reports that he does not use drugs.   Family History:  The patient's family history includes Bone cancer in his mother; Heart attack in his father.   ROS:  Please see the history of present illness.   Otherwise, review of systems is positive for none.   All other systems are reviewed and negative.   PHYSICAL EXAM: VS:  BP 130/90    Pulse 88    Ht 6' (1.829 m)    Wt 215 lb 12.8 oz (97.9 kg)    SpO2 95%  BMI 29.27 kg/m  , BMI Body mass index is 29.27 kg/m. GEN: Well nourished, well developed, in no acute distress  HEENT: normal  Neck: no JVD, carotid bruits, or masses Cardiac: RRR; no murmurs, rubs, or gallops,no edema  Respiratory:  clear to auscultation bilaterally, normal work of breathing GI: soft, nontender, nondistended, + BS MS: no deformity or atrophy  Skin: warm and dry, device site well healed Neuro:  Strength and sensation are intact Psych: euthymic mood, full affect  EKG:  EKG is not ordered today. Personal review of the ekg ordered 06/30/21 shows atrial paced, rate 81, inferior infarct  Personal review of the device interrogation today. Results in [place]: 01/17/2021: B Natriuretic Peptide 169.7 05/30/2021: Magnesium 2.1; TSH 1.920 06/30/2021: ALT 23; BUN 16; Creatinine, Ser 1.05; Hemoglobin 16.1; NT-Pro BNP 1,179; Platelets 160; Potassium 4.0; Sodium 143    Lipid Panel     Component Value Date/Time   CHOL 120 04/18/2021 0133   CHOL 115 01/24/2019 0930   TRIG 80 04/18/2021 0133   HDL 30 (L) 04/18/2021 0133   HDL 33 (L) 01/24/2019 0930   CHOLHDL 4.0 04/18/2021 0133   VLDL 16 04/18/2021 0133   LDLCALC 74 04/18/2021 0133   LDLCALC 50 09/15/2019 1538     Wt Readings from Last 3 Encounters:  07/08/21 215 lb 12.8 oz (97.9 kg)  06/30/21 216 lb 3.2 oz (98.1 kg)  05/30/21 217 lb (98.4 kg)      Other studies Reviewed: Additional studies/ records that were reviewed today include: LHC 08/19/20  Review of the above records  today demonstrates:  Prox RCA to Dist RCA lesion is 15% stenosed. Ost RCA to Prox RCA lesion is 40% stenosed. Ramus lesion is 60% stenosed. Dist RCA lesion is 45% stenosed.  TTE 03/01/20  1. Left ventricular ejection fraction, by estimation, is 45 to 50%. The  left ventricle has mildly decreased function. The left ventricle  demonstrates global hypokinesis. Left ventricular diastolic parameters are  indeterminate.   2. Right ventricular systolic function is normal. The right ventricular  size is normal.   3. Left atrial size was severely dilated.   4. The mitral valve is normal in structure. Trivial mitral valve  regurgitation. No evidence of mitral stenosis.   5. The aortic valve is tricuspid. Aortic valve regurgitation is not  visualized. No aortic stenosis is present.   6. Aortic dilatation noted. There is mild dilatation of the aortic root,  measuring 41 mm.   7. The inferior vena cava is dilated in size with >50% respiratory  variability, suggesting right atrial pressure of 8 mmHg.   ASSESSMENT AND PLAN:  1.  Ventricular tachycardia: Has had multiple episodes with fluid rates.  Medtronic ICD implanted 08/20/2020.  He was initially on both sotalol and mexiletine but continued to have episodes.  Now on amiodarone.  High risk medication monitoring.  He has had no further episodes.  We Jotham Ahn continue with current management.  2.  Atrial flutter: Currently on Eliquis.  CHA2DS2-VASc of 5.  3.  Coronary artery disease: Hospitalized August 2022 with ACS.  Stent to the RCA.  Currently on aspirin and Plavix.  No current chest pain.  4.  Chronic systolic heart failure due to ischemic cardiomyopathy: Ejection fraction mildly reduced.  Currently on Toprol-XL 50 mg, Aldactone 25 mg, Entresto 49/51 mg twice daily.  No obvious volume overload  5.  Lung adenocarcinoma: Currently undergoing radiation and chemotherapy.   Current medicines are reviewed at length  with the patient today.   The  patient does not have concerns regarding his medicines.  The following changes were made today: None  Labs/ tests ordered today include:  No orders of the defined types were placed in this encounter.   Disposition:   FU with Wilma Wuthrich 6 months  Signed, Dandra Velardi Meredith Leeds, MD  07/08/2021 10:00 AM     [facility] [address] [place] [address] [phone] (office) [phone] (fax)

## 2021-07-11 ENCOUNTER — Ambulatory Visit
Admission: RE | Admit: 2021-07-11 | Discharge: 2021-07-11 | Disposition: A | Discharge status: Home / Self Care | Payer: Medicare Other | Source: Ambulatory Visit | Attending: Radiation Oncology | Admitting: Radiation Oncology

## 2021-07-11 DIAGNOSIS — C3412 Malignant neoplasm of upper lobe, left bronchus or lung: Secondary | ICD-10-CM

## 2021-07-11 NOTE — Progress Notes (Signed)
°  Radiation Oncology         [phone]) [phone] ________________________________  Name: John [name] MRN: [id number]  Date of Service: 07/11/2021  DOB: 06/29/1950  Post Treatment Telephone Note  Diagnosis:   Stage IA3, cT1cN0M0, NSCLC, favor adenosquamous carcinoma of the LUL  Intent: Curative  Radiation Treatment Dates:  06/07/2021 through 06/14/2021 SBRT Treatment Site Technique Total Dose (Gy) Dose per Fx (Gy) Completed Fx Beam Energies  Lung, LUL IMRT 54/54 18 3/3 6XFFF   Narrative: The patient tolerated radiation therapy relatively well.    Impression/Plan: 1. Stage IA3, cT1cN0M0, NSCLC, favor adenosquamous carcinoma of the LUL. The patient has been doing well since completion of radiotherapy. I was unable to reach the patient but left a voicemail and on the message, I  let him know I had already ordered a CT scan for new baseline assessment and will follow up with these results by phone mid to late February 2023.     Carola Rhine, PAC

## 2021-07-12 ENCOUNTER — Ambulatory Visit: Payer: Medicare Other | Attending: Cardiovascular Disease | Admitting: Cardiovascular Disease

## 2021-07-12 DIAGNOSIS — Z9989 Dependence on other enabling machines and devices: Secondary | ICD-10-CM | POA: Insufficient documentation

## 2021-07-12 DIAGNOSIS — G4733 Obstructive sleep apnea (adult) (pediatric): Secondary | ICD-10-CM

## 2021-07-12 DIAGNOSIS — G4736 Sleep related hypoventilation in conditions classified elsewhere: Secondary | ICD-10-CM | POA: Insufficient documentation

## 2021-07-14 ENCOUNTER — Encounter: Payer: Medicare Other | Admitting: Cardiology

## 2021-07-14 ENCOUNTER — Other Ambulatory Visit: Payer: Self-pay

## 2021-07-14 ENCOUNTER — Ambulatory Visit: Payer: Medicare Other

## 2021-07-14 DIAGNOSIS — M216X2 Other acquired deformities of left foot: Secondary | ICD-10-CM | POA: Diagnosis not present

## 2021-07-14 DIAGNOSIS — E119 Type 2 diabetes mellitus without complications: Secondary | ICD-10-CM | POA: Diagnosis not present

## 2021-07-14 DIAGNOSIS — Z89422 Acquired absence of other left toe(s): Secondary | ICD-10-CM | POA: Diagnosis not present

## 2021-07-14 DIAGNOSIS — M21612 Bunion of left foot: Secondary | ICD-10-CM

## 2021-07-14 DIAGNOSIS — M2042 Other hammer toe(s) (acquired), left foot: Secondary | ICD-10-CM

## 2021-07-14 DIAGNOSIS — E0843 Diabetes mellitus due to underlying condition with diabetic autonomic (poly)neuropathy: Secondary | ICD-10-CM

## 2021-07-14 NOTE — Progress Notes (Signed)
SITUATION Reason for Visit: Fitting of Diabetic Shoes & Insoles Patient / Caregiver Report:     OBJECTIVE DATA: Patient History / Diagnosis:     ICD-10-CM   1. Diabetes mellitus due to underlying condition with diabetic autonomic neuropathy, unspecified whether long term insulin use (HCC)  E08.43     2. Status post amputation of lesser toe, left (HCC)  Z89.422     3. Bunion, left  M21.612     4. Hammertoe of second toe of left foot  M20.42       Change in Status:   None  ACTIONS PERFORMED: In-Person Delivery, patient was fit with: - 1x pair [id number] PDAC approved prefabricated Diabetic Shoes: Apex V753 M 12.5 xw - 3x pair [id number] PDAC approved CAM milled custom diabetic insoles: [employer] [phone]  Shoes and insoles were verified for structural integrity and safety. Patient wore shoes and insoles in office. Skin was inspected and free of areas of concern after wearing shoes and inserts. Shoes and inserts fit properly. Patient / Caregiver provided with ferbal instruction and demonstration regarding donning, doffing, wear, care, proper fit, function, purpose, cleaning, and use of shoes and insoles ' and in all related precautions and risks and benefits regarding shoes and insoles. Patient / Caregiver was instructed to wear properly fitting socks with shoes at all times. Patient was also provided with verbal instruction regarding how to report any failures or malfunctions of shoes or inserts, and necessary follow up care. Patient / Caregiver was also instructed to contact physician regarding change in status that may affect function of shoes and inserts.   Patient / Caregiver verbalized undersatnding of instruction provided. Patient / Caregiver demonstrated independence with proper donning and doffing of shoes and inserts.  PLAN Patient to follow up as needed. Plan of care was discussed with and agreed upon by patient and/or caregiver. All questions were answered and concerns addressed.

## 2021-07-15 ENCOUNTER — Telehealth: Payer: Self-pay

## 2021-07-15 NOTE — Telephone Encounter (Signed)
1121 am.  Phone call made to patient to follow up on Palliative Care referral.  Services explained to patient.  Patient declines services at this time.  Advised patient of how to contact Palliative Care if he would like services in the future.

## 2021-07-17 ENCOUNTER — Encounter: Payer: Self-pay | Admitting: Cardiovascular Disease

## 2021-07-20 NOTE — Telephone Encounter (Signed)
Try taking spironolactone every other day.

## 2021-07-21 ENCOUNTER — Ambulatory Visit (INDEPENDENT_AMBULATORY_CARE_PROVIDER_SITE_OTHER): Payer: Medicare Other

## 2021-07-21 ENCOUNTER — Other Ambulatory Visit: Payer: Self-pay

## 2021-07-21 VITALS — Ht 72.0 in | Wt 216.0 lb

## 2021-07-21 DIAGNOSIS — Z0000 Encounter for general adult medical examination without abnormal findings: Secondary | ICD-10-CM

## 2021-07-21 NOTE — Telephone Encounter (Signed)
-  Pt updated with MD's recommendations. -Pt state he started taking spironolactone every other day after last phone conversation on 07/05/21.

## 2021-07-21 NOTE — Progress Notes (Signed)
Subjective:   John [name] is a 71 y.o. male who presents for an Initial Medicare Annual Wellness Visit. Virtual Visit via Telephone Note  I connected with  [name] on 07/21/21 at  9:45 AM EST by telephone and verified that I am speaking with the correct person using two identifiers.  Location: Patient: HOME Provider: [facility] Persons participating in the virtual visit: patient/Nurse Health Advisor   I discussed the limitations, risks, security and privacy concerns of performing an evaluation and management service by telephone and the availability of in person appointments. The patient expressed understanding and agreed to proceed.  Interactive audio and video telecommunications were attempted between this nurse and patient, however failed, due to patient having technical difficulties OR patient did not have access to video capability.  We continued and completed visit with audio only.  Some vital signs may be absent or patient reported.   Chriss Driver, LPN  Review of Systems     Cardiac Risk Factors include: advanced age (>35men, >36 women);diabetes mellitus;dyslipidemia;male gender;sedentary lifestyle;Other (see comment), Risk factor comments: CVA, Lung cancer  PHONE VISIT. PT AT HOME. NURSE AT [facility].    Objective:    Today's Vitals   07/21/21 0946 07/21/21 0947  Weight: 216 lb (98 kg)   Height: 6' (1.829 m)   PainSc:  0-No pain   Body mass index is 29.29 kg/m.  Advanced Directives 07/21/2021 05/23/2021 05/18/2021 04/17/2021 02/24/2021 02/18/2021 02/15/2021  Does Patient Have a Medical Advance Directive? Yes No No No No No No  Type of Advance Directive [place] in Chart? No - copy requested - - - - - -  Would patient like information on creating a medical advance directive? No - Patient declined No - Patient declined No - Patient declined No - Patient declined No - Patient declined No - Patient  declined No - Patient declined  Pre-existing out of facility DNR order (yellow form or pink MOST form) - - - - - - -    Current Medications (verified) Outpatient Encounter Medications as of 07/21/2021  Medication Sig   albuterol (VENTOLIN HFA) 108 (90 Base) MCG/ACT inhaler Inhale 2 puffs into the lungs every 6 (six) hours as needed for wheezing or shortness of breath.   amiodarone (PACERONE) 200 MG tablet Take 1 tablet (200 mg total) by mouth daily.   apixaban (ELIQUIS) 5 MG TABS tablet Take 1 tablet (5 mg total) by mouth 2 (two) times daily.   clopidogrel (PLAVIX) 75 MG tablet Take 1 tablet (75 mg total) by mouth daily with breakfast.   Fluticasone Furoate (ARNUITY ELLIPTA) 200 MCG/ACT AEPB Inhale 1 puff into the lungs daily.   insulin aspart (NOVOLOG FLEXPEN) 100 UNIT/ML FlexPen INJECT 5-20 UNITS SUBCUTANEOUSLY WITH LUNCH AND DINNER (Patient taking differently: Inject 6-8 Units into the skin daily as needed for high blood sugar.)   Insulin Pen Needle (NOVOFINE) 32G X 6 MM MISC 1 each by Other route daily.   JARDIANCE 25 MG TABS tablet TAKE 1 TABLET BY MOUTH EVERY DAY   LEVEMIR FLEXTOUCH 100 UNIT/ML FlexTouch Pen INJECT 46 UNITS INTO THE SKIN DAILY. DX:E11.9   metoprolol succinate (TOPROL-XL) 50 MG 24 hr tablet Take 1 tablet (50 mg total) by mouth daily. Take with or immediately following a meal.   mexiletine (MEXITIL) 150 MG capsule Take 2 capsules (300 mg total) by mouth every 12 (twelve) hours.   montelukast (  SINGULAIR) 10 MG tablet TAKE 1 TABLET BY MOUTH EVERY DAY (Patient taking differently: Take 10 mg by mouth daily.)   nitroGLYCERIN (NITROSTAT) 0.4 MG SL tablet PLACE 1 TABLET (0.4 MG TOTAL) UNDER THE TONGUE EVERY 5 (FIVE) MINUTES AS NEEDED FOR CHEST PAIN.   ONETOUCH ULTRA test strip USE TO CHECK BLOOD SUGAR 3 TIMES A DAY (E11.9)   rosuvastatin (CRESTOR) 40 MG tablet Take 1 tablet (40 mg total) by mouth daily.   sacubitril-valsartan (ENTRESTO) 49-51 MG Take 1 tablet by mouth 2 (two) times  daily.   spironolactone (ALDACTONE) 25 MG tablet Take 1 tablet (25 mg total) by mouth every other day.   No facility-administered encounter medications on file as of 07/21/2021.    Allergies (verified) Omega-3 fatty acids, Benazepril, and Fish allergy   History: Past Medical History:  Diagnosis Date   Arm DVT (deep venous thromboembolism), acute, left (HCC)    while off eliquis for bronchoscopy 2022   Atrial flutter (HCC)    s/p cardioversion   Coronary artery disease    Diabetes mellitus    GERD (gastroesophageal reflux disease)    History of nuclear stress test 04/04/2011   lexiscan; mod-large in size fixed inferolateral defect (scar); non-diagnostic for ischemia; low risk scan    Hyperlipidemia    Hypertension    Ischemic cardiomyopathy    Left foot drop    r/t past disk srugery - uses Kevlar brace   Lung cancer ([place]) 04/26/2021   Myocardial infarction [facility])    posterior MI   Osteomyelitis ([place])    s/p left 2nd toe amputation in 01/2021   Pulmonary nodule    Recurrent ventricular tachycardia    Shortness of breath    Sleep apnea    on CPAP; 04/28/2007 split-night - AHI during total sleep 44.43/hr and REM 72.56/hr   Past Surgical History:  Procedure Laterality Date   AMPUTATION TOE Left 02/10/2021   Procedure: AMPUTATION  LEFT SECOND TOE;  Surgeon: Edrick Kins, DPM;  Location: [place];  Service: Podiatry;  Laterality: Left;   [place]   BRONCHIAL BIOPSY  04/26/2021   Procedure: BRONCHIAL BIOPSIES;  Surgeon: Garner Nash, DO;  Location: [place] [facility];  Service: Pulmonary;;   BRONCHIAL BRUSHINGS  04/26/2021   Procedure: BRONCHIAL BRUSHINGS;  Surgeon: Garner Nash, DO;  Location: [place] [facility];  Service: Pulmonary;;   BRONCHIAL NEEDLE ASPIRATION BIOPSY  04/26/2021   Procedure: BRONCHIAL NEEDLE ASPIRATION BIOPSIES;  Surgeon: Garner Nash, DO;  Location: [place];  Service: Pulmonary;;   CARDIAC CATHETERIZATION  2010   6 stents total   CARDIAC  CATHETERIZATION  01/2000   percutaneous transluminal coronary balloon angioplasty of mid RCA stenotic lesion   CARDIAC CATHETERIZATION  06/2006   no stenting; ischemic cardiomyopathy, EF 40-45%   CARDIOVERSION N/A 07/28/2016   Procedure: CARDIOVERSION;  Surgeon: Troy Sine, MD;  Location: [place];  Service: Cardiovascular;  Laterality: N/A;   CORONARY ANGIOPLASTY  09/1998   mid-distal RCA balloon dilatation, 4.5 & 5.0 stents    CORONARY ANGIOPLASTY WITH STENT PLACEMENT  03/1994   angioplasty & stenting (non-DES) of circumflex/prox ramus intermedius   CORONARY ANGIOPLASTY WITH STENT PLACEMENT  10/1994   large iliac [id number] stent to RCA   [place]  12/2002   4.80mm stents x2 of RCA   CORONARY ANGIOPLASTY WITH STENT PLACEMENT  01/2005   cutting balloon arthrectomy of distal RCA & Cypher DES 3.5x13; cutting balloon arthrectomy of mid RCA with Cypher  DES 3.5x18   CORONARY ANGIOPLASTY WITH STENT PLACEMENT  11/2008   stenting of mid RCA with 4.0x101mm driver, non-DES   CORONARY BALLOON ANGIOPLASTY N/A 02/15/2021   Procedure: CORONARY BALLOON ANGIOPLASTY;  Surgeon: Troy Sine, MD;  Location: [place] [facility];  Service: Cardiovascular;  Laterality: N/A;   FIDUCIAL MARKER PLACEMENT  04/26/2021   Procedure: FIDUCIAL MARKER PLACEMENT;  Surgeon: Garner Nash, DO;  Location: [place] [facility];  Service: Pulmonary;;   ICD IMPLANT N/A 08/20/2020   Procedure: ICD IMPLANT;  Surgeon: Constance Haw, MD;  Location: [place] [facility];  Service: Cardiovascular;  Laterality: N/A;   INTRAVASCULAR PRESSURE WIRE/FFR STUDY N/A 03/02/2020   Procedure: INTRAVASCULAR PRESSURE WIRE/FFR STUDY;  Surgeon: Leonie Man, MD;  Location: [place] [facility];  Service: Cardiovascular;  Laterality: N/A;   LEFT HEART CATH AND CORONARY ANGIOGRAPHY N/A 03/02/2020   Procedure: LEFT HEART CATH AND CORONARY ANGIOGRAPHY;  Surgeon: Leonie Man, MD;  Location: [place] [facility];  Service:  Cardiovascular;  Laterality: N/A;   LEFT HEART CATH AND CORONARY ANGIOGRAPHY N/A 08/19/2020   Procedure: LEFT HEART CATH AND CORONARY ANGIOGRAPHY;  Surgeon: Lorretta Harp, MD;  Location: [place] [facility];  Service: Cardiovascular;  Laterality: N/A;   LEFT HEART CATH AND CORONARY ANGIOGRAPHY N/A 02/15/2021   Procedure: LEFT HEART CATH AND CORONARY ANGIOGRAPHY;  Surgeon: Troy Sine, MD;  Location: [place] [facility];  Service: Cardiovascular;  Laterality: N/A;   LEFT HEART CATHETERIZATION WITH CORONARY ANGIOGRAM N/A 02/27/2012   Procedure: LEFT HEART CATHETERIZATION WITH CORONARY ANGIOGRAM;  Surgeon: Lorretta Harp, MD;  Location: [facility] C[facility] LAB;  Service: Cardiovascular;  Laterality: N/A;   TRANSTHORACIC ECHOCARDIOGRAM  07/29/2010   EF 50=55%, mod inf wall hypokinesis & mild post wall hypokinesis; LA mild-mod dilated; mild mitral annular calcif & mild MR; mild TR & elevated RV systolic pressure; AV mildly sclerotic; mild aortic root dilatation    V TACH ABLATION N/A 01/20/2021   Procedure: V TACH ABLATION;  Surgeon: Vickie Epley, MD;  Location: [place] [facility];  Service: Cardiovascular;  Laterality: N/A;   VIDEO BRONCHOSCOPY WITH ENDOBRONCHIAL NAVIGATION Left 04/26/2021   Procedure: VIDEO BRONCHOSCOPY WITH ENDOBRONCHIAL NAVIGATION;  Surgeon: Garner Nash, DO;  Location: [place];  Service: Pulmonary;  Laterality: Left;  ION w/ fiducial   VIDEO BRONCHOSCOPY WITH RADIAL ENDOBRONCHIAL ULTRASOUND  04/26/2021   Procedure: RADIAL ENDOBRONCHIAL ULTRASOUND;  Surgeon: Garner Nash, DO;  Location: [facility];  Service: Pulmonary;;   Family History  Problem Relation Age of Onset   Bone cancer Mother    Heart attack Father    Social History   Socioeconomic History   Marital status: Married    Spouse name: [name]   Number of children: 3   Years of education: Not on file   Highest education level: Not on file  Occupational History   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 [place]  Tobacco Use   Smoking status: Former    Packs/day: 1.00    Years: 50.00    Pack years: 50.00    Types: Cigarettes    Quit date: 07/20/2016    Years since quitting: 5.0   Smokeless tobacco: Never  Vaping Use   Vaping Use: Never used  Substance and Sexual Activity   Alcohol use: Not Currently    Alcohol/week: 0.0 standard drinks   Drug use: No   Sexual activity: Yes  Other Topics Concern   Not on file  Social History Narrative   Married x 38 years.   Social Determinants of Health   Financial Resource Strain: Low Risk    Difficulty of Paying Living Expenses: Not hard at all  Food Insecurity: No Food Insecurity   Worried About Charity fundraiser in the Last Year: Never true   [place] in the Last Year: Never true  Transportation Needs: No Transportation Needs   Lack of Transportation (Medical): No   Lack of Transportation (Non-Medical): No  Physical Activity: Inactive   Days of Exercise per Week: 0 days   Minutes of Exercise per Session: 0 min  Stress: No Stress Concern Present   Feeling of Stress : Not at all  Social Connections: Moderately Isolated   Frequency of Communication with Friends and Family: Once a week   Frequency of Social Gatherings with Friends and Family: More than three times a week   Attends Religious Services: Never   Marine scientist or Organizations: No   Attends Music therapist: Never   Marital Status: Married    Tobacco Counseling Counseling given: Not Answered   Clinical Intake:  Pre-visit preparation completed: Yes  Pain : No/denies pain Pain Score: 0-No pain     Nutritional Status: BMI 25 -29 Overweight Nutritional Risks: None Diabetes: Yes  How often do you need to have someone help you when you read instructions, pamphlets, or other written materials from your doctor or pharmacy?: 1 - Never  Diabetic?Nutrition Risk Assessment:  Has the patient had any N/V/D  within the last 2 months?  No  Does the patient have any non-healing wounds?  No  Has the patient had any unintentional weight loss or weight gain?  No   Diabetes:  Is the patient diabetic?  Yes  If diabetic, was a CBG obtained today?  No  Did the patient bring in their glucometer from home?  No  How often do you monitor your CBG's? 2-3 times per day.   Financial Strains and Diabetes Management:  Are you having any financial strains with the device, your supplies or your medication? No .  Does the patient want to be seen by Chronic Care Management for management of their diabetes?  No  Would the patient like to be referred to a Nutritionist or for Diabetic Management?  No   Diabetic Exams:  Diabetic Eye Exam: Completed 10/28/2020.  Pt has been advised about the importance in completing this exam.  Diabetic Foot Exam: Completed 12/22/2020. Pt has been advised about the importance in completing this exam.   Interpreter Needed?: No  Information entered by :: mj Aarohi Redditt, lpn   Activities of Daily Living In your present state of health, do you have any difficulty performing the following activities: 07/21/2021 04/17/2021  Hearing? Y N  Comment Deaf in [place]? N N  Difficulty concentrating or making decisions? N N  Walking or climbing stairs? N N  Dressing or bathing? N N  Doing errands, shopping? N N  Preparing Food and eating ? N -  Using the Toilet? N -  In the past six months, have you accidently leaked urine? N -  Do you have problems with loss of bowel control? N -  Managing your Medications? N -  Managing your Finances? N -  Housekeeping or managing your Housekeeping? N -  Some recent data might be hidden    Patient Care Team: [place],  [name], MD as PCP - General (Family Medicine) Troy Sine, MD as PCP - Cardiology (Cardiology) Constance Haw, MD as PCP - Electrophysiology (Cardiology) Kassie Mends, RN as Case Manager  Indicate any recent  Medical Services you may have received from other than [facility] providers in the past year (date may be approximate).     Assessment:   This is a routine wellness examination for Male.  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  Vision Screening - Comments:: Glasses when reading. 10/28/2020.  Dietary issues and exercise activities discussed: Current Exercise Habits: The patient does not participate in regular exercise at present, Exercise limited by: cardiac condition(s);neurologic condition(s);Other - see comments (CVA)   Goals Addressed             This Visit's Progress    Exercise 3x per week (30 min per time)       Increase as tolerated.        Depression Screen PHQ 2/9 Scores 07/21/2021 05/23/2021 03/04/2019 09/11/2017 07/09/2017 06/20/2017 12/01/2015  PHQ - 2 Score 0 0 0 0 0 0 0    Fall Risk Fall Risk  07/21/2021 05/23/2021 03/04/2019 01/20/2019 07/09/2017  Falls in the past year? 1 1 0 0 No  Comment - - - [place] Telephone Survey: data to providers prior to load -  Number falls in past yr: 1 1 0 - -  Injury with Fall? 1 0 0 - -  Risk for fall due to : History of fall(s);Impaired balance/gait;Impaired mobility;Impaired vision - - - -  Follow up Falls prevention discussed - - - -    FALL RISK PREVENTION PERTAINING TO THE HOME:  Any stairs in or around the home? Yes  If so, are there any without handrails? No  Home free of loose throw rugs in walkways, pet beds, electrical cords, etc? Yes  Adequate lighting in your home to reduce risk of falls? Yes   ASSISTIVE DEVICES UTILIZED TO PREVENT FALLS:  Life alert? No  Use of a cane, walker or w/c? Yes  Grab bars in the bathroom? Yes  Shower chair or bench in shower? Yes  Elevated toilet seat or a handicapped toilet? Yes   TIMED UP AND GO:  Was the test performed? No .  Phone visit.  Cognitive Function:     6CIT Screen 07/21/2021  What Year? 0 points  What month? 0 points  What time? 0  points  Count back from 20 0 points  Months in reverse 0 points  Repeat phrase 0 points  Total Score 0    Immunizations Immunization History  Administered Date(s) Administered   Fluad Quad(high Dose 65+) 02/12/2019, 03/10/2020, 04/14/2021   Influenza, High Dose Seasonal PF 03/15/2017, 03/14/2018   Influenza, Seasonal, Injecte, Preservative Fre 04/07/2013   Influenza,inj,Quad PF,6+ Mos 06/04/2014, 04/08/2015, 04/18/2016   Influenza-Unspecified 02/25/2019   Moderna Sars-Covid-2 Vaccination 08/06/2019, 09/24/2019, 05/21/2020   Pneumococcal Conjugate-13 01/02/2014   Pneumococcal Polysaccharide-23 01/07/2016   Tdap 12/29/2012    TDAP status: Up to date  Flu Vaccine status: Up to date  Pneumococcal vaccine status: Up to date  Covid-19 vaccine status: Completed vaccines  Qualifies for Shingles Vaccine? Yes   Zostavax completed No   Shingrix Completed?: No.    Education has been provided regarding the importance of this vaccine. Patient has been advised to call insurance company to determine out of pocket expense if they have not yet received this vaccine. Advised may also receive vaccine  at local pharmacy or Health Dept. Verbalized acceptance and understanding.  Screening Tests Health Maintenance  Topic Date Due   Hepatitis C Screening  Never done   Zoster Vaccines- Shingrix (1 of 2) Never done   COLONOSCOPY (Pts 45-62yrs Insurance coverage will need to be confirmed)  Never done   COVID-19 Vaccine (4 - Booster for Moderna series) 07/16/2020   HEMOGLOBIN A1C  10/16/2021   OPHTHALMOLOGY EXAM  10/28/2021   FOOT EXAM  12/22/2021   TETANUS/TDAP  12/30/2022   Pneumonia Vaccine 56+ Years old  Completed   INFLUENZA VACCINE  Completed   HPV VACCINES  Aged Out    Health Maintenance  Health Maintenance Due  Topic Date Due   Hepatitis C Screening  Never done   Zoster Vaccines- Shingrix (1 of 2) Never done   COLONOSCOPY (Pts 45-38yrs Insurance coverage will need to be confirmed)   Never done   COVID-19 Vaccine (4 - Booster for Moderna series) 07/16/2020    Colorectal cancer screening: Referral to GI placed PT DECLINED AT THIS TIME. Pt aware the office will call re: appt.  Lung Cancer Screening: (Low Dose CT Chest recommended if Age 13-80 years, 30 pack-year currently smoking OR have quit w/in 15years.) does qualify.   Lung Cancer Screening Referral: Repeat chest CT scheduled for 08/02/2021.  Additional Screening:  Hepatitis C Screening: does qualify; Completed DUE  Vision Screening: Recommended annual ophthalmology exams for early detection of glaucoma and other disorders of the eye. Is the patient up to date with their annual eye exam?  Yes  Who is the provider or what is the name of the office in which the patient attends annual eye exams? Dr. Leticia Clas If pt is not established with a provider, would they like to be referred to a provider to establish care? No .   Dental Screening: Recommended annual dental exams for proper oral hygiene  Community Resource Referral / Chronic Care Management: CRR required this visit?  No   CCM required this visit?  No      Plan:     I have personally reviewed and noted the following in the patients chart:   Medical and social history Use of alcohol, tobacco or illicit drugs  Current medications and supplements including opioid prescriptions. Patient is not currently taking opioid prescriptions. Functional ability and status Nutritional status Physical activity Advanced directives List of other physicians Hospitalizations, surgeries, and ER visits in previous 12 months Vitals Screenings to include cognitive, depression, and falls Referrals and appointments  In addition, I have reviewed and discussed with patient certain preventive protocols, quality metrics, and best practice recommendations. A written personalized care plan for preventive services as well as general preventive health recommendations were  provided to patient.     Chriss Driver, LPN   10/23/3147   Nurse Notes: Discussed Cologuard and Colonoscopy. Pt requests to wait until after lung cancer treatments are finished to schedule. Discussed Shingrix and how to obtain. Pt has repeat chest CT scheduled for 08/02/2021.

## 2021-07-21 NOTE — Patient Instructions (Signed)
[name] , Thank you for taking time to come for your Medicare Wellness Visit. I appreciate your ongoing commitment to your health goals. Please review the following plan we discussed and let me know if I can assist you in the future.   Screening recommendations/referrals: Colonoscopy: Discussed. Please call when ready to schedule.  Recommended yearly ophthalmology/optometry visit for glaucoma screening and checkup Recommended yearly dental visit for hygiene and checkup  Vaccinations: Influenza vaccine: Done 04/14/2021 Repeat annually  Pneumococcal vaccine: Done 01/02/2014 and 01/07/2016. Tdap vaccine: Done 12/29/2012 Repeat in 10 years  Shingles vaccine: Discussed. Contact your pharmacy for availability.    Covid-19: Done 08/06/2019, 09/24/2019 and 05/21/2020  Advanced directives: Please bring a copy of your health care power of attorney and living will to the office to be added to your chart at your convenience.   Conditions/risks identified: Aim for 30 minutes of exercise each day, drink 6-8 glasses of water and eat lots of fruits and vegetables.   Next appointment: Follow up in one year for your annual wellness visit. 2024.  Preventive Care 80 Years and Older, Male  Preventive care refers to lifestyle choices and visits with your health care provider that can promote health and wellness. What does preventive care include? A yearly physical exam. This is also called an annual well check. Dental exams once or twice a year. Routine eye exams. Ask your health care provider how often you should have your eyes checked. Personal lifestyle choices, including: Daily care of your teeth and gums. Regular physical activity. Eating a healthy diet. Avoiding tobacco and drug use. Limiting alcohol use. Practicing safe sex. Taking low doses of aspirin every day. Taking vitamin and mineral supplements as recommended by your health care provider. What happens during an annual well  check? The services and screenings done by your health care provider during your annual well check will depend on your age, overall health, lifestyle risk factors, and family history of disease. Counseling  Your health care provider may ask you questions about your: Alcohol use. Tobacco use. Drug use. Emotional well-being. Home and relationship well-being. Sexual activity. Eating habits. History of falls. Memory and ability to understand (cognition). Work and work Statistician. Screening  You may have the following tests or measurements: Height, weight, and BMI. Blood pressure. Lipid and cholesterol levels. These may be checked every 5 years, or more frequently if you are over 50 years old. Skin check. Lung cancer screening. You may have this screening every year starting at age 66 if you have a 30-pack-year history of smoking and currently smoke or have quit within the past 15 years. Fecal occult blood test (FOBT) of the stool. You may have this test every year starting at age 58. Flexible sigmoidoscopy or colonoscopy. You may have a sigmoidoscopy every 5 years or a colonoscopy every 10 years starting at age 11. Prostate cancer screening. Recommendations will vary depending on your family history and other risks. Hepatitis C blood test. Hepatitis B blood test. Sexually transmitted disease (STD) testing. Diabetes screening. This is done by checking your blood sugar (glucose) after you have not eaten for a while (fasting). You may have this done every 1-3 years. Abdominal aortic aneurysm (AAA) screening. You may need this if you are a current or former smoker. Osteoporosis. You may be screened starting at age 33 if you are at high risk. Talk with your health care provider about your test results, treatment options, and if necessary, the need for more tests. Vaccines  Your  health care provider may recommend certain vaccines, such as: Influenza vaccine. This is recommended every  year. Tetanus, diphtheria, and acellular pertussis (Tdap, Td) vaccine. You may need a Td booster every 10 years. Zoster vaccine. You may need this after age 58. Pneumococcal 13-valent conjugate (PCV13) vaccine. One dose is recommended after age 73. Pneumococcal polysaccharide (PPSV23) vaccine. One dose is recommended after age 7. Talk to your health care provider about which screenings and vaccines you need and how often you need them. This information is not intended to replace advice given to you by your health care provider. Make sure you discuss any questions you have with your health care provider. Document Released: 07/02/2015 Document Revised: 02/23/2016 Document Reviewed: 04/06/2015 Elsevier Interactive Patient Education  2017 [place] Prevention in the Home Falls can cause injuries. They can happen to people of all ages. There are many things you can do to make your home safe and to help prevent falls. What can I do on the outside of my home? Regularly fix the edges of walkways and driveways and fix any cracks. Remove anything that might make you trip as you walk through a door, such as a raised step or threshold. Trim any bushes or trees on the path to your home. Use bright outdoor lighting. Clear any walking paths of anything that might make someone trip, such as rocks or tools. Regularly check to see if handrails are loose or broken. Make sure that both sides of any steps have handrails. Any raised decks and porches should have guardrails on the edges. Have any leaves, snow, or ice cleared regularly. Use sand or salt on walking paths during winter. Clean up any spills in your garage right away. This includes oil or grease spills. What can I do in the bathroom? Use night lights. Install grab bars by the toilet and in the tub and shower. Do not use towel bars as grab bars. Use non-skid mats or decals in the tub or shower. If you need to sit down in the shower, use a  plastic, non-slip stool. Keep the floor dry. Clean up any water that spills on the floor as soon as it happens. Remove soap buildup in the tub or shower regularly. Attach bath mats securely with double-sided non-slip rug tape. Do not have throw rugs and other things on the floor that can make you trip. What can I do in the bedroom? Use night lights. Make sure that you have a light by your bed that is easy to reach. Do not use any sheets or blankets that are too big for your bed. They should not hang down onto the floor. Have a firm chair that has side arms. You can use this for support while you get dressed. Do not have throw rugs and other things on the floor that can make you trip. What can I do in the kitchen? Clean up any spills right away. Avoid walking on wet floors. Keep items that you use a lot in easy-to-reach places. If you need to reach something above you, use a strong step stool that has a grab bar. Keep electrical cords out of the way. Do not use floor polish or wax that makes floors slippery. If you must use wax, use non-skid floor wax. Do not have throw rugs and other things on the floor that can make you trip. What can I do with my stairs? Do not leave any items on the stairs. Make sure that there are handrails  on both sides of the stairs and use them. Fix handrails that are broken or loose. Make sure that handrails are as long as the stairways. Check any carpeting to make sure that it is firmly attached to the stairs. Fix any carpet that is loose or worn. Avoid having throw rugs at the top or bottom of the stairs. If you do have throw rugs, attach them to the floor with carpet tape. Make sure that you have a light switch at the top of the stairs and the bottom of the stairs. If you do not have them, ask someone to add them for you. What else can I do to help prevent falls? Wear shoes that: Do not have high heels. Have rubber bottoms. Are comfortable and fit you  well. Are closed at the toe. Do not wear sandals. If you use a stepladder: Make sure that it is fully opened. Do not climb a closed stepladder. Make sure that both sides of the stepladder are locked into place. Ask someone to hold it for you, if possible. Clearly mark and make sure that you can see: Any grab bars or handrails. First and last steps. Where the edge of each step is. Use tools that help you move around (mobility aids) if they are needed. These include: Canes. Walkers. Scooters. Crutches. Turn on the lights when you go into a dark area. Replace any light bulbs as soon as they burn out. Set up your furniture so you have a clear path. Avoid moving your furniture around. If any of your floors are uneven, fix them. If there are any pets around you, be aware of where they are. Review your medicines with your doctor. Some medicines can make you feel dizzy. This can increase your chance of falling. Ask your doctor what other things that you can do to help prevent falls. This information is not intended to replace advice given to you by your health care provider. Make sure you discuss any questions you have with your health care provider. Document Released: 04/01/2009 Document Revised: 11/11/2015 Document Reviewed: 07/10/2014 Elsevier Interactive Patient Education  2017 [employer].

## 2021-07-22 ENCOUNTER — Encounter (HOSPITAL_BASED_OUTPATIENT_CLINIC_OR_DEPARTMENT_OTHER): Payer: Medicare Other | Admitting: Cardiovascular Disease

## 2021-08-01 ENCOUNTER — Other Ambulatory Visit: Payer: Medicare Other

## 2021-08-01 ENCOUNTER — Ambulatory Visit: Payer: Medicare Other | Admitting: Podiatry

## 2021-08-02 ENCOUNTER — Other Ambulatory Visit: Payer: Self-pay

## 2021-08-02 ENCOUNTER — Ambulatory Visit (HOSPITAL_COMMUNITY)
Admission: RE | Admit: 2021-08-02 | Discharge: 2021-08-02 | Disposition: A | Discharge status: Home / Self Care | Payer: Medicare Other | Source: Ambulatory Visit | Attending: Radiation Oncology | Admitting: Radiation Oncology

## 2021-08-02 DIAGNOSIS — I700 Atherosclerosis of aorta: Secondary | ICD-10-CM | POA: Diagnosis not present

## 2021-08-02 DIAGNOSIS — C3412 Malignant neoplasm of upper lobe, left bronchus or lung: Secondary | ICD-10-CM | POA: Diagnosis not present

## 2021-08-02 DIAGNOSIS — R911 Solitary pulmonary nodule: Secondary | ICD-10-CM | POA: Diagnosis not present

## 2021-08-02 LAB — POCT I-STAT CREATININE: Creatinine, Ser: 1.3 mg/dL — ABNORMAL HIGH (ref 0.61–1.24)

## 2021-08-02 MED ORDER — IOHEXOL 300 MG/ML  SOLN
75.0000 mL | Freq: Once | INTRAMUSCULAR | Status: AC | PRN
  Administered 2021-08-02: 75 mL via INTRAVENOUS

## 2021-08-03 ENCOUNTER — Telehealth: Payer: Self-pay | Admitting: Radiation Oncology

## 2021-08-03 DIAGNOSIS — C3412 Malignant neoplasm of upper lobe, left bronchus or lung: Secondary | ICD-10-CM

## 2021-08-03 NOTE — Telephone Encounter (Signed)
I called the patient to let him know the results of his CT scan and recommendations to proceed with repeat scan in 6 months per nccn guidelines. I had to leave a message with the message.

## 2021-08-05 ENCOUNTER — Encounter: Payer: Self-pay | Admitting: Cardiovascular Disease

## 2021-08-05 ENCOUNTER — Other Ambulatory Visit: Payer: Self-pay | Admitting: Cardiovascular Disease

## 2021-08-07 ENCOUNTER — Encounter: Payer: Self-pay | Admitting: Cardiovascular Disease

## 2021-08-07 NOTE — Procedures (Signed)
° ° ° °                             [facility]             Patient Name: John [name], John [name] Date: 07/12/2021 Gender: Male D.O.B: 08/08/1950 Age (years): 70 Referring Provider: Shelva Majestic MD, ABSM Height (inches): 72 Interpreting Physician: Shelva Majestic MD, ABSM Weight (lbs): 215 RPSGT: Peak, Robert BMI: 29 MRN: [id number] Neck Size: <br>  CLINICAL INFORMATION Sleep Study Type: HST  Indication for sleep study: Severe OSA on CPAP.  Initial sleep studyin 2008:  AHI 44/h; REM AHI 72.6/h.  On second machine currently with an [employer], set up in 2014.  Epworth Sleepiness Score: N/A  SLEEP STUDY TECHNIQUE A multi-channel overnight portable sleep study was performed. The channels recorded were: nasal airflow, thoracic respiratory movement, and oxygen saturation with a pulse oximetry. Snoring was also monitored.  MEDICATIONS albuterol (VENTOLIN HFA) 108 (90 Base) MCG/ACT inhaler amiodarone (PACERONE) 200 MG tablet apixaban (ELIQUIS) 5 MG TABS tablet clopidogrel (PLAVIX) 75 MG tablet Fluticasone Furoate (ARNUITY ELLIPTA) 200 MCG/ACT AEPB insulin aspart (NOVOLOG FLEXPEN) 100 UNIT/ML FlexPen Insulin Pen Needle (NOVOFINE) 32G X 6 MM MISC JARDIANCE 25 MG TABS tablet LEVEMIR FLEXTOUCH 100 UNIT/ML FlexTouch Pen metoprolol succinate (TOPROL-XL) 50 MG 24 hr tablet mexiletine (MEXITIL) 150 MG capsule nitroGLYCERIN (NITROSTAT) 0.4 MG SL tablet ONETOUCH ULTRA test strip rosuvastatin (CRESTOR) 40 MG tablet sacubitril-valsartan (ENTRESTO) 49-51 MG spironolactone (ALDACTONE) 25 MG tablet  Patient self administered medications include: N/A.  SLEEP ARCHITECTURE Patient was studied for 435 minutes. The sleep efficiency was 90.6 % and the patient was supine for 95.5%. The arousal index was 0.0 per hour.  RESPIRATORY PARAMETERS The overall AHI was 26.3 per hour, with a central apnea index of 0 per hour.  The oxygen nadir was 81% during sleep.  CARDIAC DATA Mean heart rate  during sleep was 70.9 bpm.  IMPRESSIONS - Moderate obstructive sleep apnea overall (AHI 26.3/h); however, the severity during REM sleep cannot be assessed on this home study. - Moderate oxygen desaturation to a nadir of 81%. - Patient snored 11.2% during the sleep.   DIAGNOSIS - Obstructive Sleep Apnea (G47.33) - Nocturnal Hypoxemia (G47.36)   RECOMMENDATIONS - In this patient with previously noted severe OSA particularly in REM sleep on therapy with an old ResMED S9 Elite machine at set pressure of 12, recommend Auto-PAP with EPR of 3 at 11 - 20 cm of water. - Effort should be made to optimize nasal and oropharyngeal patency,  - Positional therapy avoiding supine position during sleep. - Avoid alcohol, sedatives and other CNS depressants that may worsen sleep apnea and disrupt normal sleep architecture. - Sleep hygiene should be reviewed to assess factors that may improve sleep quality. - Weight management and regular exercise should be initiated or continued. - Recommend a download and sleep clinic evaluation after one month of therapy with a ResMed AirSense 11 CPAP Auto unit.   [Electronically signed] 08/07/2021 04:29 PM  Shelva Majestic MD, [facility], [place], American Board of Sleep Medicine   NPI: [id number]  [place] PH: [phone]   FX: [phone] [place]

## 2021-08-08 ENCOUNTER — Telehealth: Payer: Self-pay | Admitting: *Deleted

## 2021-08-08 NOTE — Telephone Encounter (Signed)
Left message to return a call to let me know where to send the order for his new CPAP machine.

## 2021-08-10 ENCOUNTER — Telehealth: Payer: Self-pay | Admitting: Family Medicine

## 2021-08-10 MED ORDER — MONTELUKAST SODIUM 10 MG PO TABS
10.0000 mg | ORAL_TABLET | Freq: Every day | ORAL | 3 refills | Status: DC
Start: 2021-08-10 — End: 2022-01-18

## 2021-08-10 NOTE — Telephone Encounter (Signed)
Received electronic fax from [facility] to request refill of montelukast (SINGULAIR) 10 MG tablet [[id number]]   Uploaded into media and routed to refill pool.   Please advise at [phone], fax number [phone].

## 2021-08-10 NOTE — Telephone Encounter (Signed)
Refill sent to pharmacy.   

## 2021-08-19 ENCOUNTER — Other Ambulatory Visit: Payer: Self-pay

## 2021-08-19 MED ORDER — NOVOFINE PEN NEEDLE 32G X 6 MM MISC
1.0000 | Freq: Every day | 5 refills | Status: DC
Start: 2021-08-19 — End: 2021-08-26

## 2021-08-23 ENCOUNTER — Ambulatory Visit (INDEPENDENT_AMBULATORY_CARE_PROVIDER_SITE_OTHER): Payer: Medicare Other | Admitting: Podiatry

## 2021-08-23 ENCOUNTER — Other Ambulatory Visit: Payer: Self-pay

## 2021-08-23 ENCOUNTER — Encounter: Payer: Self-pay | Admitting: Podiatry

## 2021-08-23 DIAGNOSIS — L84 Corns and callosities: Secondary | ICD-10-CM

## 2021-08-23 DIAGNOSIS — M21372 Foot drop, left foot: Secondary | ICD-10-CM

## 2021-08-23 DIAGNOSIS — B351 Tinea unguium: Secondary | ICD-10-CM

## 2021-08-23 DIAGNOSIS — E0843 Diabetes mellitus due to underlying condition with diabetic autonomic (poly)neuropathy: Secondary | ICD-10-CM | POA: Diagnosis not present

## 2021-08-23 DIAGNOSIS — Z89422 Acquired absence of other left toe(s): Secondary | ICD-10-CM

## 2021-08-26 ENCOUNTER — Ambulatory Visit (INDEPENDENT_AMBULATORY_CARE_PROVIDER_SITE_OTHER): Payer: Medicare Other | Admitting: *Deleted

## 2021-08-26 ENCOUNTER — Other Ambulatory Visit: Payer: Self-pay | Admitting: Family Medicine

## 2021-08-26 DIAGNOSIS — E119 Type 2 diabetes mellitus without complications: Secondary | ICD-10-CM

## 2021-08-26 DIAGNOSIS — I5022 Chronic systolic (congestive) heart failure: Secondary | ICD-10-CM

## 2021-08-26 DIAGNOSIS — J418 Mixed simple and mucopurulent chronic bronchitis: Secondary | ICD-10-CM

## 2021-08-26 MED ORDER — NOVOFINE PEN NEEDLE 32G X 6 MM MISC: 1.0000 | Freq: Every day | 5 refills | Status: DC

## 2021-08-26 NOTE — Patient Instructions (Signed)
Visit Information  Thank you for taking time to visit with me today. Please don't hesitate to contact me if I can be of assistance to you before our next scheduled telephone appointment.  Following are the goals we discussed today:  Take all medications as prescribed Attend all scheduled provider appointments Perform all self care activities independently  Call provider office for new concerns or questions  check blood sugar at prescribed times: three times daily check feet daily for cuts, sores or redness enter blood sugar readings and medication or insulin into daily log trim toenails straight across fill half of plate with vegetables manage portion size wear comfortable, well-fitting shoes identify and remove indoor air pollutants limit outdoor activity during cold weather develop a rescue plan follow rescue plan if symptoms flare-up get at least 7 to 8 hours of sleep at night Weigh daily and keep a log Watch daily for swelling in your feet If you gain 3 pounds overnight or 5 pounds in a week, please call your doctor Look over education sent via My Chart- low sodium diet and CHF action plan Practice good handwashing, avoid sick people Call RN care manager for any questions at [phone]  Our next appointment is by telephone on 10/21/21 at 1045 am  Please call the care guide team at [phone] if you need to cancel or reschedule your appointment.   If you are experiencing a Mental Health or [place] or need someone to talk to, please call the Suicide and Crisis Lifeline: 988 call the [place] National Suicide Prevention Lifeline: (843) 023-3171 or TTY: (779)465-2969 TTY 2408275791) to talk to a trained counselor call 1-800-273-TALK (toll free, 24 hour hotline) go to [facility] [address], [place] [phone]) call 911   Patient verbalizes understanding of instructions and care plan provided today and agrees to view  in [place]. Active MyChart status confirmed with patient.    John [name] RNC, BSN RN Case Manager [facility] [phone]  Heart Failure Action Plan A heart failure action plan helps you understand what to do when you have symptoms of heart failure. Your action plan is a color-coded plan that lists the symptoms to watch for and indicates what actions to take. If you have symptoms in the red zone, you need medical care right away. If you have symptoms in the yellow zone, you are having problems. If you have symptoms in the green zone, you are doing well. Follow the plan that was created by you and your health care provider. Review your plan each time you visit your health care provider. Red zone These signs and symptoms mean you should get medical help right away: You have trouble breathing when resting. You have a dry cough that is getting worse. You have swelling or pain in your legs or abdomen that is getting worse. You suddenly gain more than 2-3 lb (0.9-1.4 kg) in 24 hours, or more than 5 lb (2.3 kg) in a week. This amount may be more or less depending on your condition. You have trouble staying awake or you feel confused. You have chest pain. You do not have an appetite. You pass out. You have worsening sadness or depression. If you have any of these symptoms, call your local emergency services (911 in the [place] right away. Do not drive yourself to the hospital. Yellow zone These signs and symptoms mean your condition may be getting worse and you should make some changes: You have trouble breathing when  you are active, or you need to sleep with your head raised on extra pillows to help you breathe. You have swelling in your legs or abdomen. You gain 2-3 lb (0.9-1.4 kg) in 24 hours, or 5 lb (2.3 kg) in a week. This amount may be more or less depending on your condition. You get tired easily. You have trouble sleeping. You have a dry cough. If you have any of  these symptoms: Contact your health care provider within the next day. Your health care provider may adjust your medicines. Green zone These signs mean you are doing well and can continue what you are doing: You do not have shortness of breath. You have very little swelling or no new swelling. Your weight is stable (no gain or loss). You have a normal activity level. You do not have chest pain or any other new symptoms. Follow these instructions at home: Take over-the-counter and prescription medicines only as told by your health care provider. Weigh yourself daily. Your target weight is __________ lb (__________ kg). Call your health care provider if you gain more than __________ lb (__________ kg) in 24 hours, or more than __________ lb (__________ kg) in a week. Health care provider name: _____________________________________________________ Health care provider phone number: _____________________________________________________ Eat a heart-healthy diet. Work with a diet and nutrition specialist (dietitian) to create an eating plan that is best for you. Keep all follow-up visits. This is important. Where to find more information American Heart Association: www.heart.org Summary A heart failure action plan helps you understand what to do when you have symptoms of heart failure. Follow the action plan that was created by you and your health care provider. Get help right away if you have any symptoms in the red zone. This information is not intended to replace advice given to you by your health care provider. Make sure you discuss any questions you have with your health care provider. Document Revised: 01/19/2020 Document Reviewed: 01/19/2020 Elsevier Patient Education  2022 [place]. Low-Sodium Eating Plan Sodium, which is an element that makes up salt, helps you maintain a healthy balance of fluids in your body. Too much sodium can increase your blood pressure and cause fluid and  waste to be held in your body. Your health care provider or dietitian may recommend following this plan if you have high blood pressure (hypertension), kidney disease, liver disease, or heart failure. Eating less sodium can help lower your blood pressure, reduce swelling, and protect your heart, liver, and kidneys. What are tips for following this plan? Reading food labels The Nutrition Facts label lists the amount of sodium in one serving of the food. If you eat more than one serving, you must multiply the listed amount of sodium by the number of servings. Choose foods with less than 140 mg of sodium per serving. Avoid foods with 300 mg of sodium or more per serving. Shopping  Look for lower-sodium products, often labeled as "low-sodium" or "no salt added." Always check the sodium content, even if foods are labeled as "unsalted" or "no salt added." Buy fresh foods. Avoid canned foods and pre-made or frozen meals. Avoid canned, cured, or processed meats. Buy breads that have less than 80 mg of sodium per slice. Cooking  Eat more home-cooked food and less restaurant, buffet, and fast food. Avoid adding salt when cooking. Use salt-free seasonings or herbs instead of table salt or sea salt. Check with your health care provider or pharmacist before using salt substitutes. Cook with plant-based  oils, such as canola, sunflower, or olive oil. Meal planning When eating at a restaurant, ask that your food be prepared with less salt or no salt, if possible. Avoid dishes labeled as brined, pickled, cured, smoked, or made with soy sauce, miso, or teriyaki sauce. Avoid foods that contain MSG (monosodium glutamate). MSG is sometimes added to [place] food, bouillon, and some canned foods. Make meals that can be grilled, baked, poached, roasted, or steamed. These are generally made with less sodium. General information Most people on this plan should limit their sodium intake to 1,500-2,000 mg (milligrams)  of sodium each day. What foods should I eat? Fruits Fresh, frozen, or canned fruit. Fruit juice. Vegetables Fresh or frozen vegetables. "No salt added" canned vegetables. "No salt added" tomato sauce and paste. Low-sodium or reduced-sodium tomato and vegetable juice. Grains Low-sodium cereals, including oats, puffed wheat and rice, and shredded wheat. Low-sodium crackers. Unsalted rice. Unsalted pasta. Low-sodium bread. Whole-grain breads and whole-grain pasta. Meats and other proteins Fresh or frozen (no salt added) meat, poultry, seafood, and fish. Low-sodium canned tuna and salmon. Unsalted nuts. Dried peas, beans, and lentils without added salt. Unsalted canned beans. Eggs. Unsalted nut butters. Dairy Milk. Soy milk. Cheese that is naturally low in sodium, such as ricotta cheese, fresh mozzarella, or Swiss cheese. Low-sodium or reduced-sodium cheese. Cream cheese. Yogurt. Seasonings and condiments Fresh and dried herbs and spices. Salt-free seasonings. Low-sodium mustard and ketchup. Sodium-free salad dressing. Sodium-free light mayonnaise. Fresh or refrigerated horseradish. Lemon juice. Vinegar. Other foods Homemade, reduced-sodium, or low-sodium soups. Unsalted popcorn and pretzels. Low-salt or salt-free chips. The items listed above may not be a complete list of foods and beverages you can eat. Contact a dietitian for more information. What foods should I avoid? Vegetables Sauerkraut, pickled vegetables, and relishes. Olives. [place] fries. Onion rings. Regular canned vegetables (not low-sodium or reduced-sodium). Regular canned tomato sauce and paste (not low-sodium or reduced-sodium). Regular tomato and vegetable juice (not low-sodium or reduced-sodium). Frozen vegetables in sauces. Grains Instant hot cereals. Bread stuffing, pancake, and biscuit mixes. Croutons. Seasoned rice or pasta mixes. Noodle soup cups. Boxed or frozen macaroni and cheese. Regular salted crackers. Self-rising  flour. Meats and other proteins Meat or fish that is salted, canned, smoked, spiced, or pickled. Precooked or cured meat, such as sausages or meat loaves. Berniece Salines. Ham. Pepperoni. Hot dogs. Corned beef. Chipped beef. Salt pork. Jerky. Pickled herring. Anchovies and sardines. Regular canned tuna. Salted nuts. Dairy Processed cheese and cheese spreads. Hard cheeses. Cheese curds. Blue cheese. Feta cheese. String cheese. Regular cottage cheese. Buttermilk. Canned milk. Fats and oils Salted butter. Regular margarine. Ghee. Bacon fat. Seasonings and condiments Onion salt, garlic salt, seasoned salt, table salt, and sea salt. Canned and packaged gravies. Worcestershire sauce. Tartar sauce. Barbecue sauce. Teriyaki sauce. Soy sauce, including reduced-sodium. Steak sauce. Fish sauce. Oyster sauce. Cocktail sauce. Horseradish that you find on the shelf. Regular ketchup and mustard. Meat flavorings and tenderizers. Bouillon cubes. Hot sauce. Pre-made or packaged marinades. Pre-made or packaged taco seasonings. Relishes. Regular salad dressings. Salsa. Other foods Salted popcorn and pretzels. Corn chips and puffs. Potato and tortilla chips. Canned or dried soups. Pizza. Frozen entrees and pot pies. The items listed above may not be a complete list of foods and beverages you should avoid. Contact a dietitian for more information. Summary Eating less sodium can help lower your blood pressure, reduce swelling, and protect your heart, liver, and kidneys. Most people on this plan should limit their sodium intake to  1,500-2,000 mg (milligrams) of sodium each day. Canned, boxed, and frozen foods are high in sodium. Restaurant foods, fast foods, and pizza are also very high in sodium. You also get sodium by adding salt to food. Try to cook at home, eat more fresh fruits and vegetables, and eat less fast food and canned, processed, or prepared foods. This information is not intended to replace advice given to you by your  health care provider. Make sure you discuss any questions you have with your health care provider. Document Revised: 07/11/2019 Document Reviewed: 05/07/2019 Elsevier Patient Education  2022 [employer].

## 2021-08-26 NOTE — Chronic Care Management (AMB) (Signed)
Chronic Care Management   CCM RN Visit Note  08/26/2021 Name: John [name] MRN: [id number] DOB: 12/05/1950  Subjective: John [name] is a 71 y.o. year old male who is a primary care patient of Pickard, [name], MD. The care management team was consulted for assistance with disease management and care coordination needs.    Engaged with patient by telephone for follow up visit in response to provider referral for case management and/or care coordination services.   Consent to Services:  The patient was given information about Chronic Care Management services, agreed to services, and gave verbal consent prior to initiation of services.  Please see initial visit note for detailed documentation.   Patient agreed to services and verbal consent obtained.   Assessment: Review of patient past medical history, allergies, medications, health status, including review of consultants reports, laboratory and other test data, was performed as part of comprehensive evaluation and provision of chronic care management services.   SDOH (Social Determinants of Health) assessments and interventions performed:    CCM Care Plan  Allergies  Allergen Reactions   Omega-3 Fatty Acids Hives and Itching   Benazepril Other (See Comments)    hyperkalemia   Fish Allergy Itching    Outpatient Encounter Medications as of 08/26/2021  Medication Sig   albuterol (VENTOLIN HFA) 108 (90 Base) MCG/ACT inhaler Inhale 2 puffs into the lungs every 6 (six) hours as needed for wheezing or shortness of breath.   amiodarone (PACERONE) 200 MG tablet Take 1 tablet (200 mg total) by mouth daily.   apixaban (ELIQUIS) 5 MG TABS tablet Take 1 tablet (5 mg total) by mouth 2 (two) times daily.   clopidogrel (PLAVIX) 75 MG tablet Take 1 tablet (75 mg total) by mouth daily with breakfast.   Fluticasone Furoate (ARNUITY ELLIPTA) 200 MCG/ACT AEPB Inhale 1 puff into the lungs daily.   insulin aspart (NOVOLOG FLEXPEN) 100 UNIT/ML  FlexPen INJECT 5-20 UNITS SUBCUTANEOUSLY WITH LUNCH AND DINNER (Patient taking differently: Inject 6-8 Units into the skin daily as needed for high blood sugar.)   Insulin Pen Needle (NOVOFINE PEN NEEDLE) 32G X 6 MM MISC 1 each by Does not apply route daily.   Insulin Pen Needle (NOVOFINE) 32G X 6 MM MISC 1 each by Other route daily.   JARDIANCE 25 MG TABS tablet TAKE 1 TABLET BY MOUTH EVERY DAY   LEVEMIR FLEXTOUCH 100 UNIT/ML FlexTouch Pen INJECT 46 UNITS INTO THE SKIN DAILY. DX:E11.9   metoprolol succinate (TOPROL-XL) 50 MG 24 hr tablet Take 1 tablet (50 mg total) by mouth daily. Take with or immediately following a meal.   mexiletine (MEXITIL) 150 MG capsule Take 2 capsules (300 mg total) by mouth every 12 (twelve) hours.   montelukast (SINGULAIR) 10 MG tablet Take 1 tablet (10 mg total) by mouth daily.   nitroGLYCERIN (NITROSTAT) 0.4 MG SL tablet PLACE 1 TABLET (0.4 MG TOTAL) UNDER THE TONGUE EVERY 5 (FIVE) MINUTES AS NEEDED FOR CHEST PAIN.   ONETOUCH ULTRA test strip USE TO CHECK BLOOD SUGAR 3 TIMES A DAY (E11.9)   rosuvastatin (CRESTOR) 40 MG tablet Take 1 tablet (40 mg total) by mouth daily.   sacubitril-valsartan (ENTRESTO) 49-51 MG Take 1 tablet by mouth 2 (two) times daily.   spironolactone (ALDACTONE) 25 MG tablet Take 1 tablet (25 mg total) by mouth every other day.   No facility-administered encounter medications on file as of 08/26/2021.    Patient Active Problem List   Diagnosis Date Noted   DVT  of left axillary vein, acute ([place]) 58/52/7782   Embolic stroke ([place]) 42/35/3614   History of non-ST elevation myocardial infarction (NSTEMI) 43/15/4008   Chronic systolic CHF (congestive heart failure) ([place]) 02/08/2021   VT (ventricular tachycardia) 01/26/2021   Coagulation defect ([place]) 12/22/2020   Non-ST elevation (NSTEMI) myocardial infarction ([place])    Capsulitis 05/21/2020   Thoracic aortic aneurysm 02/29/2020   Obesity (BMI 30.0-34.9) 02/29/2020   Malignant neoplasm of upper  lobe of left lung ([place]) 02/19/2020   Former smoker 02/19/2020   Acoustic neuroma ([place]) 09/11/2017   Asymmetrical sensorineural hearing loss 09/11/2017   Anticoagulation adequate 06/06/2016   Hyperlipidemia LDL goal <70 05/20/2015   OSA on CPAP 07/23/2013   Hypertension    Hyperlipidemia    Tobacco abuse 08/18/2012   CAD, multiple prior RCA PCI's. Last cath 2010, Myoview low risk Oct 2012 02/26/2012   DM type 2, goal HbA1c < 7% (HCC) 02/26/2012   HTN (hypertension) 02/26/2012   COPD (chronic obstructive pulmonary disease) ([place]) 02/26/2012   Smoking, quit one week ago 02/26/2012   GERD (gastroesophageal reflux disease) 02/26/2012    Conditions to be addressed/monitored:CHF, COPD, and DMII  Care Plan : RN Care Manager Plan of Care  Updates made by Kassie Mends, RN since 08/26/2021 12:00 AM     Problem: No plan of care established for management of chronic disease states  (DM2, COPD, CHF, HTN, Cancer)   Priority: High     Long-Range Goal: Development of plan of care for chronic disease management   (DM2, COPD, CHF, HTN, Cancer)   Start Date: 05/23/2021  Expected End Date: 11/19/2021  Priority: High  Note:   Current Barriers:  Knowledge Deficits related to plan of care for management of CHF, COPD, and DMII  Patient reports he lives with spouse, is independent with all aspects of his care, pt drives and has had no issues recently getting to appointments, has a cane that he uses as needed when he leaves home, reports has all medications and taking as prescribed and able to afford, pt reports he has CPAP and had sleep study, is in process of getting a new CPAP and there is 4-5 month waiting list, pt states he is using the same CPAP at present, has inhalers to use for COPD, checks CBG BID with am ranges 85-100, pm ranges 160-170.[name]  Reports he does weigh daily with weight today 200 pounds, has new diagnosis lung cancer few weeks ago and reports "stage 1 and started radiation", reports no  advanced directives and in process of completing with assistance at oncology.  Patient reports he got new insoles for her shoes, patient declined palliative care. Patient reports he is low on insulin pen needles and he states his pharmacy was supposed to fax primary care provider and pt has not heard anything.  RNCM Clinical Goal(s):  Patient will verbalize understanding of plan for management of COPD and DMII as evidenced by patient report, review EHR and  through collaboration with RN Care manager, provider, and care team.   Interventions: 1:1 collaboration with primary care provider regarding development and update of comprehensive plan of care as evidenced by provider attestation and co-signature Inter-disciplinary care team collaboration (see longitudinal plan of care) Evaluation of current treatment plan related to  self management and patient's adherence to plan as established by provider   COPD Interventions:  (Status:  New goal.) Long Term Goal Advised patient to self assesses COPD action plan zone and make appointment with provider if in  the yellow zone for 48 hours without improvement Advised patient to engage in light exercise as tolerated 3-5 days a week to aid in the the management of COPD Provided education about and advised patient to utilize infection prevention strategies to reduce risk of respiratory infection Discussed the importance of adequate rest and management of fatigue with COPD   Diabetes Interventions:  (Status:  New goal.) Long Term Goal Assessed patient's understanding of A1c goal: <7% Reviewed medications with patient and discussed importance of medication adherence Review of patient status, including review of consultants reports, relevant laboratory and other test results, and medications completed Reinforced carbohydrate modified diet In basket message sent to primary care provider requesting prescription for refill insulin pen needles 32G tip x 55mm be sent  to [facility] in [place] Lab Results  Component Value Date   HGBA1C 6.9 (H) 04/18/2021   Heart Failure Interventions:  (Status:  New goal. and Goal on track:  Yes.) Long Term Goal Basic overview and discussion of pathophysiology of Heart Failure reviewed Provided education on low sodium diet Reviewed Heart Failure Action Plan in depth and provided written copy Assessed need for readable accurate scales in home Provided education about placing scale on hard, flat surface Advised patient to weigh each morning after emptying bladder Discussed importance of daily weight and advised patient to weigh and record daily Discussed the importance of keeping all appointments with provider Education sent via My Chart- low sodium diet and CHF action plan   Patient Goals/Self-Care Activities: Take all medications as prescribed Attend all scheduled provider appointments Perform all self care activities independently  Call provider office for new concerns or questions  check blood sugar at prescribed times: three times daily check feet daily for cuts, sores or redness enter blood sugar readings and medication or insulin into daily log trim toenails straight across fill half of plate with vegetables manage portion size wear comfortable, well-fitting shoes identify and remove indoor air pollutants limit outdoor activity during cold weather develop a rescue plan follow rescue plan if symptoms flare-up get at least 7 to 8 hours of sleep at night Weigh daily and keep a log Watch daily for swelling in your feet If you gain 3 pounds overnight or 5 pounds in a week, please call your doctor Look over education sent via My Chart- low sodium diet and CHF action plan Practice good handwashing, avoid sick people Call RN care manager for any questions at [phone]       Plan:Telephone follow up appointment with care management team member scheduled for:  10/21/21  Jacqlyn Larsen [facility], BSN RN Case  Manager [facility] [phone]

## 2021-08-29 NOTE — Progress Notes (Signed)
?  Subjective:  ?Patient ID: John [name], male    DOB: Aug 11, 1950,  MRN: [id number] ? ?John [name] presents to clinic today for at risk foot care. Patient has h/o amputation of digital amputation left second digit ? ?Patient did not check blood glucose today. ? ?New problem(s):  Patient is requesting new AFO for left lower extremity. He is s/p CVA with left sided hemiplegia. States his AFO is greater than 73 years old ? ?PCP is John Frizzle, MD , and last visit was May 19, 2021. ? ?Allergies  ?Allergen Reactions  ? Omega-3 Fatty Acids Hives and Itching  ? Benazepril Other (See Comments)  ?  hyperkalemia  ? Fish Allergy Itching  ? ? ?Review of Systems: Negative except as noted in the HPI. ? ?Objective: ?Constitutional John [name] is a pleasant 71 y.o. Caucasian male, obese in NAD. AAO x 3.   ?Vascular CFT <3 seconds b/l LE. Palpable DP/PT pulses b/l LE. Digital hair sparse b/l. Skin temperature gradient WNL b/l. No pain with calf compression b/l. Trace edema noted b/l. No cyanosis or clubbing noted b/l LE.  ?Neurologic Normal speech. Oriented to person, place, and time. Protective sensation intact 5/5 intact bilaterally with 10g monofilament b/l. Vibratory sensation intact b/l.  ?Dermatologic Pedal skin is warm and supple b/l LE. No open wounds b/l LE. No interdigital macerations noted b/l LE. Toenails 1-5 right, L hallux, L 3rd toe, L 4th toe, and L 5th toe elongated, discolored, dystrophic, thickened, and crumbly with subungual debris and tenderness to dorsal palpation. Hyperkeratotic lesion(s)  submet head 2 left foot, submet head 5 right foot.  No erythema, no edema, no drainage, no fluctuance.  ?Orthopedic: Wearing AFO on left lower extremity. Muscle strength 5/5 to all LE muscle groups of right lower extremity. Foot drop LLE. Lower extremity amputation(s): digital amputation L 2nd toe.  ? ?Radiographs: None ? ?Hemoglobin A1C Latest Ref Rng & Units 04/18/2021 04/17/2021 02/17/2021 02/08/2021  01/26/2021  ?HGBA1C 4.8 - 5.6 % 6.9(H) 6.8(H) 7.6(H) 8.0(H) 7.8(H)  ?Some recent data might be hidden  ? ?Assessment/Plan: ?1. Onychomycosis   ?2. Callus   ?3. Status post amputation of lesser toe, left ([place])   ?4. Foot drop, left foot   ?5. Diabetes mellitus due to underlying condition with diabetic autonomic neuropathy, unspecified whether long term insulin use ([place])   ? ?-Will schedule patient with Pedorthist for casting for medically necessary DME device AFO to assist with left sided hemiplegia secondary to CVA.. ?-Mycotic toenails 1-5 right foot, 3-5 left foot, and L hallux were debrided in length and girth with sterile nail nippers and dremel without iatrogenic bleeding. ?-Callus(es) submet head 2 left foot and submet head 5 right foot pared utilizing sterile scalpel blade without complication or incident. Total number debrided =2. ?-Patient/POA to call should there be question/concern in the interim.  ? ?Return in about 3 months (around 11/23/2021). ? ?John [name], DPM  ?

## 2021-09-01 IMAGING — MR MR FOOT*L* W/O CM
4 of 5 series · 19 of 40 positions shown · non-contrast
Comparison: Radiograph 02/08/2021

CLINICAL DATA: Foot swelling, diabetic, osteomyelitis suspected,
xray done

EXAM:
MRI OF THE LEFT FOOT WITHOUT CONTRAST
TECHNIQUE: Multiplanar, multisequence MR imaging of the left foot was
performed. No intravenous contrast was administered.

[Series 4: T1 · coronal · 3.0mm · 0.31mm/px · 3 of 51 slices shown (1 of 2)]
[im 6/51]
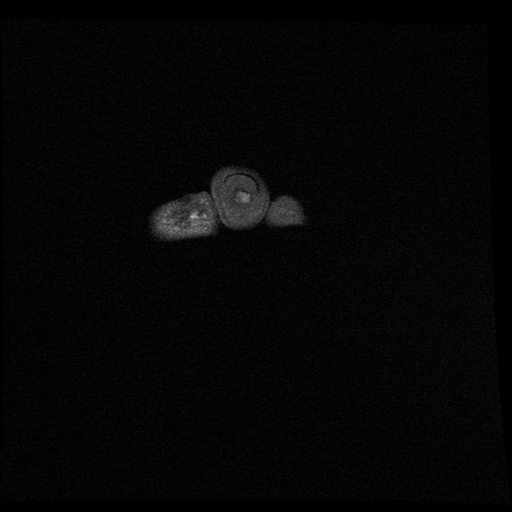
[im 28/51]
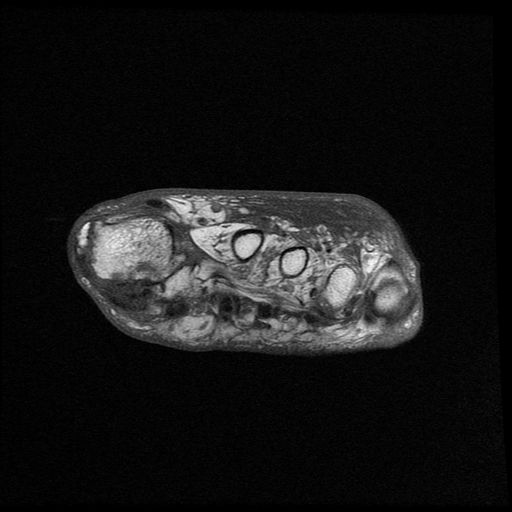
[im 45/51]
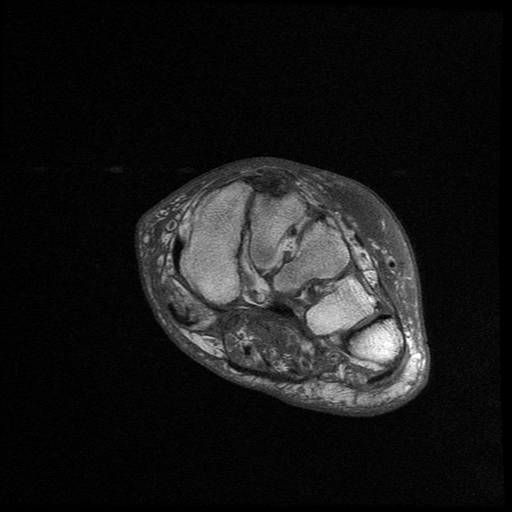

[Series 5: T2 fat-sat · coronal · 3.0mm · 0.31mm/px · 10 of 51 slices shown]
[im 1/51]
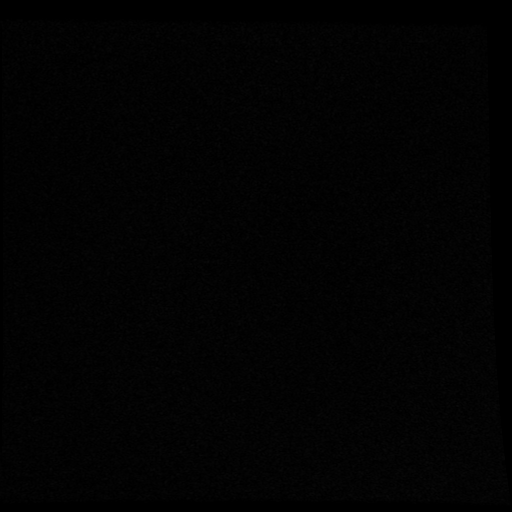
[im 6/51]
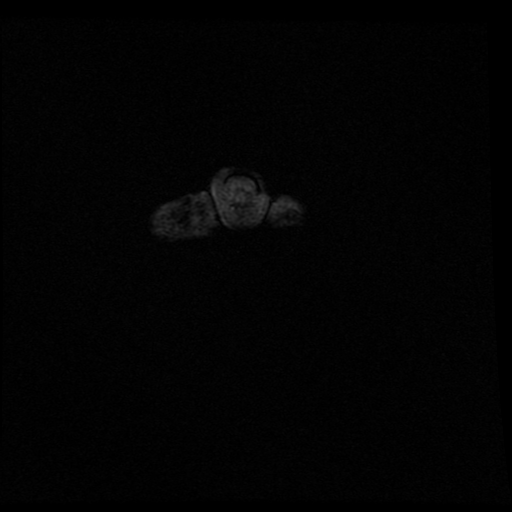
[im 11/51]
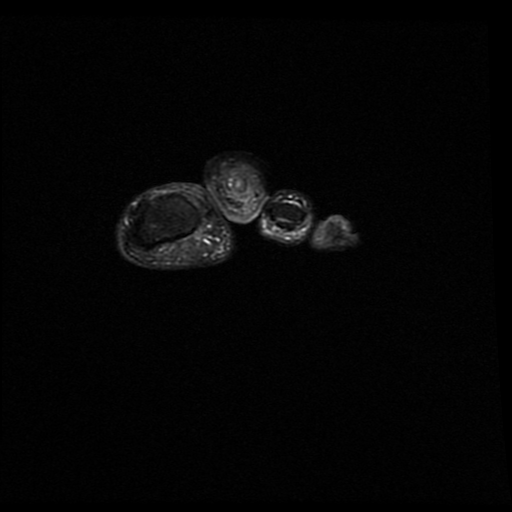
[im 16/51]
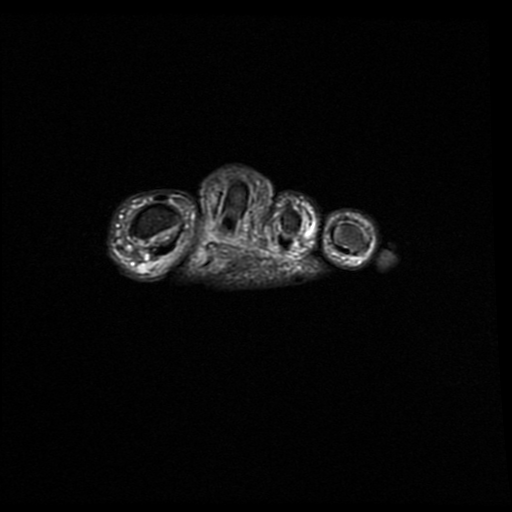
[im 21/51]
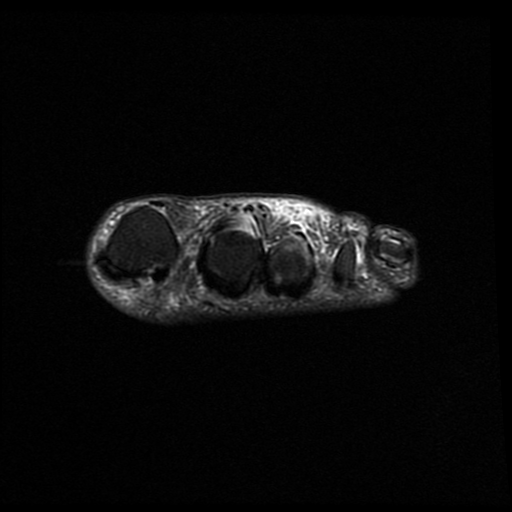
[im 26/51]
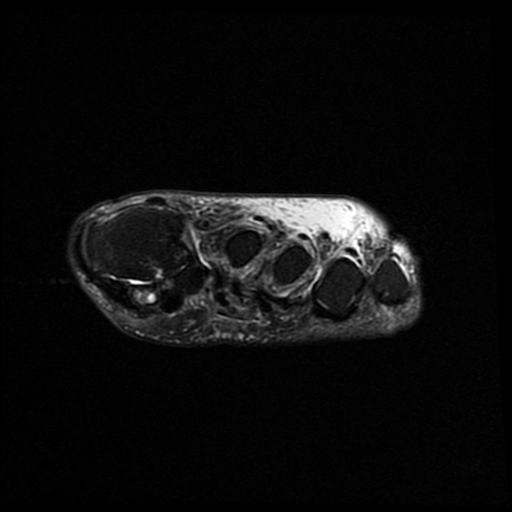
[im 31/51]
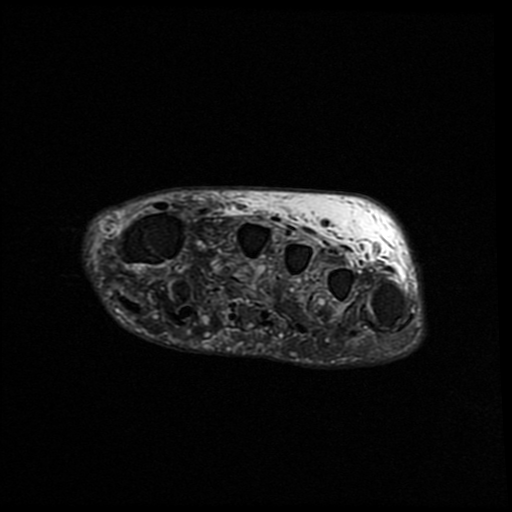
[im 36/51]
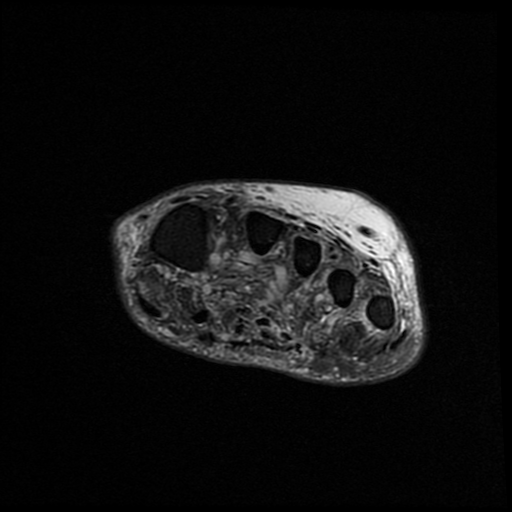
[im 41/51]
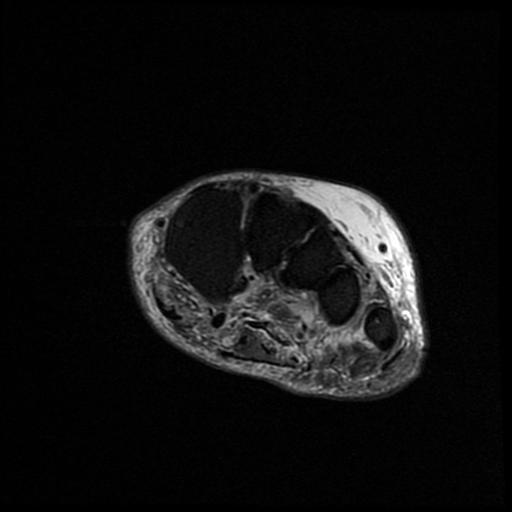
[im 46/51]
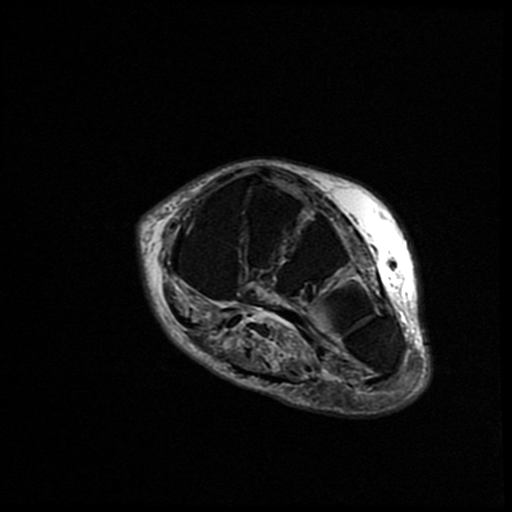

[Series 6: T1 · oblique · 3.0mm · 0.33mm/px · 3 of 26 slices shown (2 of 2)]
[im 6/26]
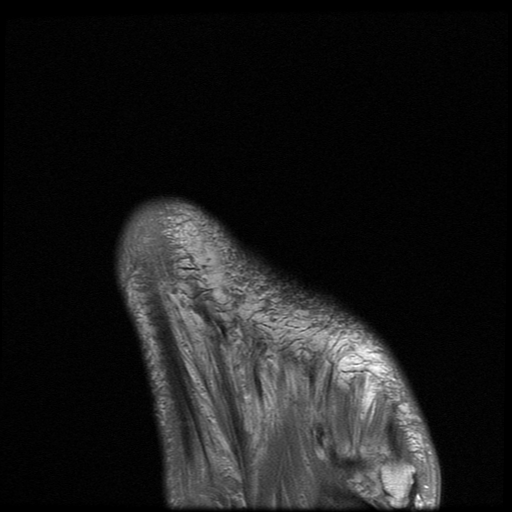
[im 16/26]
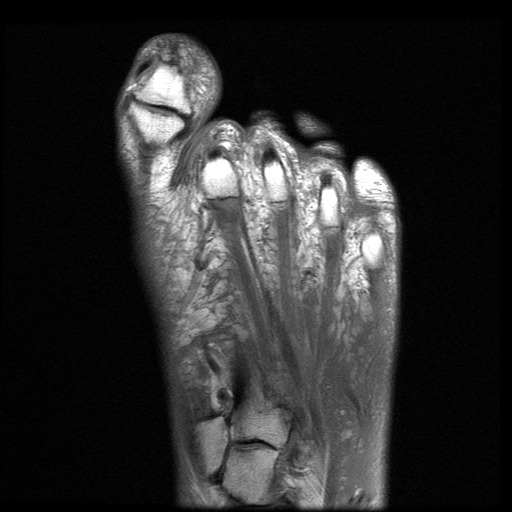
[im 26/26]
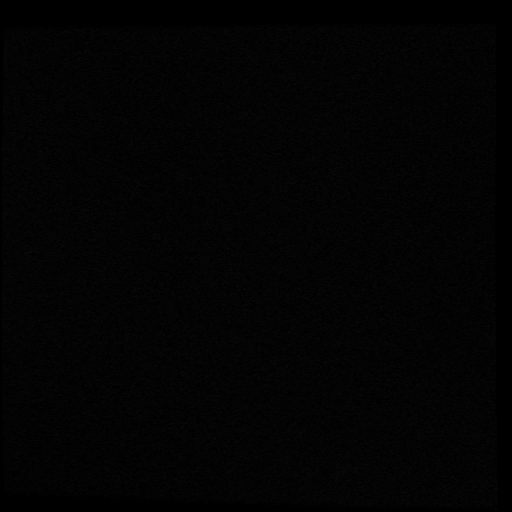

[Series 9: STIR · oblique · 3.0mm · 0.33mm/px · 3 of 26 slices shown]
[im 6/26]
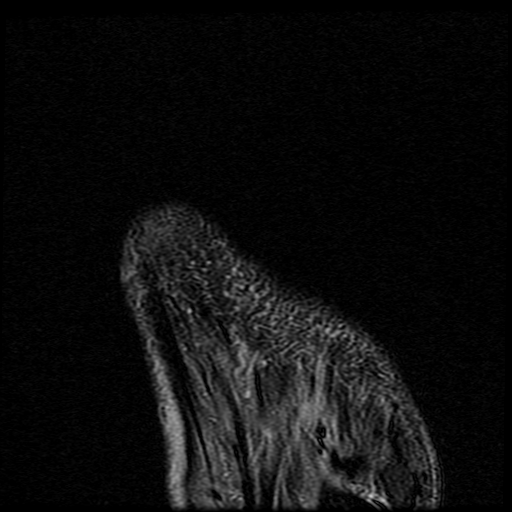
[im 16/26]
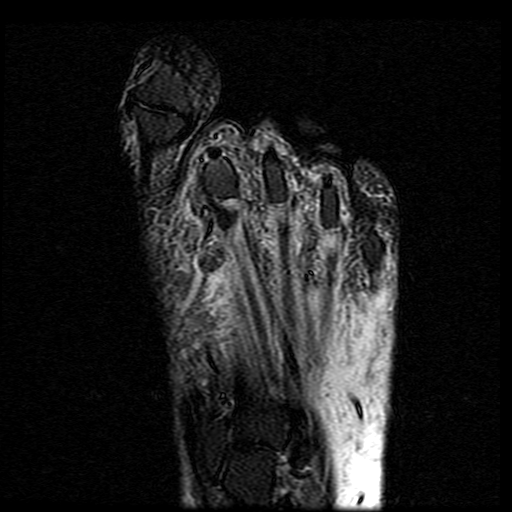
[im 26/26]
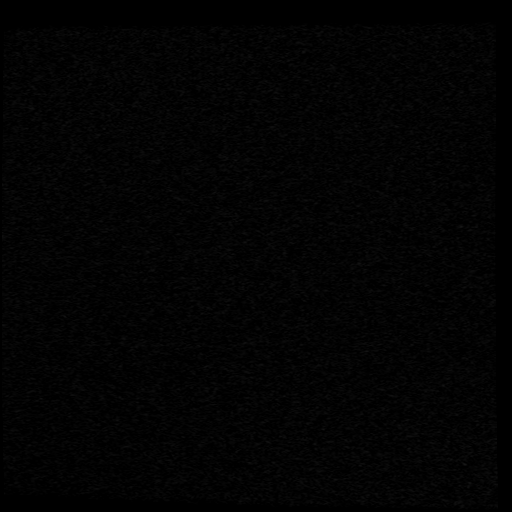

[19 of 40 positions shown; findings below may reference images not displayed]

FINDINGS: Bones/Joint/Cartilage

There is increased T2 signal with confluent low T1 signal in the
middle and distal phalanx of the second toe. Hallux valgus with
moderate first MTP degenerative change.

Ligaments

Intact Lisfranc ligament.

Muscles and Tendons

Diffuse muscle atrophy in the foot. Diffuse intramuscular edema as
is commonly seen in diabetics.

Soft tissues

No soft tissue mass or focal fluid collection. Generalized soft
tissue swelling of the foot, most prominent dorsally.
IMPRESSION: Osteomyelitis of the second toe distal and middle phalanx with
adjacent soft tissue ulcer. No evidence of soft tissue abscess.

## 2021-09-03 ENCOUNTER — Other Ambulatory Visit: Payer: Self-pay | Admitting: Physician Assistant

## 2021-09-07 DIAGNOSIS — Z20822 Contact with and (suspected) exposure to covid-19: Secondary | ICD-10-CM | POA: Diagnosis not present

## 2021-09-16 DIAGNOSIS — E119 Type 2 diabetes mellitus without complications: Secondary | ICD-10-CM | POA: Diagnosis not present

## 2021-09-16 DIAGNOSIS — J418 Mixed simple and mucopurulent chronic bronchitis: Secondary | ICD-10-CM | POA: Diagnosis not present

## 2021-09-16 DIAGNOSIS — I5022 Chronic systolic (congestive) heart failure: Secondary | ICD-10-CM

## 2021-09-27 ENCOUNTER — Other Ambulatory Visit: Payer: Self-pay | Admitting: Family Medicine

## 2021-10-03 ENCOUNTER — Ambulatory Visit: Payer: Medicare Other | Admitting: Cardiovascular Disease

## 2021-10-12 ENCOUNTER — Ambulatory Visit (INDEPENDENT_AMBULATORY_CARE_PROVIDER_SITE_OTHER): Payer: Medicare Other

## 2021-10-12 DIAGNOSIS — I255 Ischemic cardiomyopathy: Secondary | ICD-10-CM | POA: Diagnosis not present

## 2021-10-13 LAB — CUP PACEART REMOTE DEVICE CHECK
Battery Remaining Longevity: 102 mo
Battery Voltage: 2.99 V
Brady Statistic AP VP Percent: 0.09 %
Brady Statistic AP VS Percent: 94.95 %
Brady Statistic AS VP Percent: 0 %
Brady Statistic AS VS Percent: 4.96 %
Brady Statistic RA Percent Paced: 95.03 %
Brady Statistic RV Percent Paced: 0.1 %
Date Time Interrogation Session: 20230427001609
HighPow Impedance: 67 Ohm
Implantable Lead Implant Date: 20220304
Implantable Lead Implant Date: 20220304
Implantable Lead Location: 753859
Implantable Lead Location: 753860
Implantable Lead Model: 5076
Implantable Pulse Generator Implant Date: 20220304
Lead Channel Impedance Value: 380 Ohm
Lead Channel Impedance Value: 437 Ohm
Lead Channel Impedance Value: 456 Ohm
Lead Channel Pacing Threshold Amplitude: 0.75 V
Lead Channel Pacing Threshold Amplitude: 1.125 V
Lead Channel Pacing Threshold Pulse Width: 0.4 ms
Lead Channel Pacing Threshold Pulse Width: 0.4 ms
Lead Channel Sensing Intrinsic Amplitude: 14.75 mV
Lead Channel Sensing Intrinsic Amplitude: 14.75 mV
Lead Channel Sensing Intrinsic Amplitude: 3.375 mV
Lead Channel Sensing Intrinsic Amplitude: 3.375 mV
Lead Channel Setting Pacing Amplitude: 1.5 V
Lead Channel Setting Pacing Amplitude: 2.25 V
Lead Channel Setting Pacing Pulse Width: 0.4 ms
Lead Channel Setting Sensing Sensitivity: 0.3 mV

## 2021-10-14 ENCOUNTER — Telehealth: Payer: Self-pay | Admitting: Cardiology

## 2021-10-14 NOTE — Telephone Encounter (Signed)
Spoke with patient informed device function was normal patient voiced understanding. ? ?Called patient back to inform him of Dr. Curt Bears recommendations "  Optivol elevated. Needs ICM monitoring. Encourage low salt diet." Unable to reach patient.  ?

## 2021-10-14 NOTE — Telephone Encounter (Signed)
Patient calling in to get the results from the report he just had. Please advise  ?

## 2021-10-17 NOTE — Telephone Encounter (Signed)
2nd attempt to contact patient to advise about Dr. Curt Bears recommendations.  ? ?No answer, LMTCB. ?

## 2021-10-17 NOTE — Progress Notes (Signed)
? ? ?Office Visit  ?  ?Patient Name: John [name] ?Date of Encounter: 10/18/2021 ? ?Primary Care Provider:  [name], MD ?Primary Cardiologist:  Shelva Majestic, MD ? ?Chief Complaint  ?  ?71 year old male with a history of CAD s/p multiple PCI's, most recently in 01/2021, ICM, chronic combined systolic and diastolic heart failure (EF 25 to 30%), VT s/p ablation in 01/2021 with recurrence amiodarone and mexiletine (follows with EP), paroxysmal atrial flutter on Eliquis, hypertension, hyperlipidemia, type 2 diabetes, OSA on CPAP, ILD, lung cancer s/p chemo and radiation, and osteomyelitis s/p left second toe amputation, who presents for follow-up related to CAD, heart failure, and VT. ? ?Past Medical History  ?  ?Past Medical History:  ?Diagnosis Date  ? Arm DVT (deep venous thromboembolism), acute, left ([place])   ? while off eliquis for bronchoscopy 2022  ? Atrial flutter [facility])   ? s/p cardioversion  ? Coronary artery disease   ? Diabetes mellitus   ? GERD (gastroesophageal reflux disease)   ? History of nuclear stress test 04/04/2011  ? lexiscan; mod-large in size fixed inferolateral defect (scar); non-diagnostic for ischemia; low risk scan   ? Hyperlipidemia   ? Hypertension   ? Ischemic cardiomyopathy   ? Left foot drop   ? r/t past disk srugery - uses Kevlar brace  ? Lung cancer ([place]) 04/26/2021  ? Myocardial infarction [facility])   ? posterior MI  ? Osteomyelitis ([place])   ? s/p left 2nd toe amputation in 01/2021  ? Pulmonary nodule   ? Recurrent ventricular tachycardia ([place])   ? Shortness of breath   ? Sleep apnea   ? on CPAP; 04/28/2007 split-night - AHI during total sleep 44.43/hr and REM 72.56/hr  ? ?Past Surgical History:  ?Procedure Laterality Date  ? AMPUTATION TOE Left 02/10/2021  ? Procedure: AMPUTATION  LEFT SECOND TOE;  Surgeon: Edrick Kins, DPM;  Location: [place];  Service: Podiatry;  Laterality: Left;  ? [place]  ? BRONCHIAL BIOPSY  04/26/2021  ? Procedure: BRONCHIAL BIOPSIES;  Surgeon:  Garner Nash, DO;  Location: [place] [facility];  Service: Pulmonary;;  ? BRONCHIAL BRUSHINGS  04/26/2021  ? Procedure: BRONCHIAL BRUSHINGS;  Surgeon: Garner Nash, DO;  Location: [place] [facility];  Service: Pulmonary;;  ? BRONCHIAL NEEDLE ASPIRATION BIOPSY  04/26/2021  ? Procedure: BRONCHIAL NEEDLE ASPIRATION BIOPSIES;  Surgeon: Garner Nash, DO;  Location: [place] [facility];  Service: Pulmonary;;  ? CARDIAC CATHETERIZATION  2010  ? 6 stents total  ? CARDIAC CATHETERIZATION  01/2000  ? percutaneous transluminal coronary balloon angioplasty of mid RCA stenotic lesion  ? CARDIAC CATHETERIZATION  06/2006  ? no stenting; ischemic cardiomyopathy, EF 40-45%  ? CARDIOVERSION N/A 07/28/2016  ? Procedure: CARDIOVERSION;  Surgeon: Troy Sine, MD;  Location: [place];  Service: Cardiovascular;  Laterality: N/A;  ? CORONARY ANGIOPLASTY  09/1998  ? mid-distal RCA balloon dilatation, 4.5 & 5.0 stents   ? CORONARY ANGIOPLASTY WITH STENT PLACEMENT  03/1994  ? angioplasty & stenting (non-DES) of circumflex/prox ramus intermedius  ? CORONARY ANGIOPLASTY WITH STENT PLACEMENT  10/1994  ? large iliac [id number] stent to RCA  ? CORONARY ANGIOPLASTY WITH STENT PLACEMENT  12/2002  ? 4.43mm stents x2 of RCA  ? CORONARY ANGIOPLASTY WITH STENT PLACEMENT  01/2005  ? cutting balloon arthrectomy of distal RCA & Cypher DES 3.5x13; cutting balloon arthrectomy of mid RCA with Cypher DES 3.5x18  ? CORONARY ANGIOPLASTY WITH STENT PLACEMENT  11/2008  ? stenting of mid  RCA with 4.0x24mm driver, non-DES  ? CORONARY BALLOON ANGIOPLASTY N/A 02/15/2021  ? Procedure: CORONARY BALLOON ANGIOPLASTY;  Surgeon: Troy Sine, MD;  Location: [place] CV [facility];  Service: Cardiovascular;  Laterality: N/A;  ? FIDUCIAL MARKER PLACEMENT  04/26/2021  ? Procedure: FIDUCIAL MARKER PLACEMENT;  Surgeon: Garner Nash, DO;  Location: [place];  Service: Pulmonary;;  ? ICD IMPLANT N/A 08/20/2020  ? Procedure: ICD IMPLANT;  Surgeon: Constance Haw, MD;  Location: [place] CV LAB;  Service: Cardiovascular;  Laterality: N/A;  ? INTRAVASCULAR PRESSURE WIRE/FFR STUDY N/A 03/02/2020  ? Procedure: INTRAVASCULAR PRESSURE WIRE/FFR STUDY;  Surgeon: Leonie Man, MD;  Location: [place] CV LAB;  Service: Cardiovascular;  Laterality: N/A;  ? LEFT HEART CATH AND CORONARY ANGIOGRAPHY N/A 03/02/2020  ? Procedure: LEFT HEART CATH AND CORONARY ANGIOGRAPHY;  Surgeon: Leonie Man, MD;  Location: [place] [facility];  Service: Cardiovascular;  Laterality: N/A;  ? LEFT HEART CATH AND CORONARY ANGIOGRAPHY N/A 08/19/2020  ? Procedure: LEFT HEART CATH AND CORONARY ANGIOGRAPHY;  Surgeon: Lorretta Harp, MD;  Location: [place] CV LAB;  Service: Cardiovascular;  Laterality: N/A;  ? LEFT HEART CATH AND CORONARY ANGIOGRAPHY N/A 02/15/2021  ? Procedure: LEFT HEART CATH AND CORONARY ANGIOGRAPHY;  Surgeon: Troy Sine, MD;  Location: [place] [facility];  Service: Cardiovascular;  Laterality: N/A;  ? LEFT HEART CATHETERIZATION WITH CORONARY ANGIOGRAM N/A 02/27/2012  ? Procedure: LEFT HEART CATHETERIZATION WITH CORONARY ANGIOGRAM;  Surgeon: Lorretta Harp, MD;  Location: [facility];  Service: Cardiovascular;  Laterality: N/A;  ? TRANSTHORACIC ECHOCARDIOGRAM  07/29/2010  ? EF 50=55%, mod inf wall hypokinesis & mild post wall hypokinesis; LA mild-mod dilated; mild mitral annular calcif & mild MR; mild TR & elevated RV systolic pressure; AV mildly sclerotic; mild aortic root dilatation   ? V TACH ABLATION N/A 01/20/2021  ? Procedure: V TACH ABLATION;  Surgeon: Vickie Epley, MD;  Location: [place] CV LAB;  Service: Cardiovascular;  Laterality: N/A;  ? VIDEO BRONCHOSCOPY WITH ENDOBRONCHIAL NAVIGATION Left 04/26/2021  ? Procedure: VIDEO BRONCHOSCOPY WITH ENDOBRONCHIAL NAVIGATION;  Surgeon: Garner Nash, DO;  Location: [place];  Service: Pulmonary;  Laterality: Left;  ION w/ fiducial  ? VIDEO BRONCHOSCOPY WITH RADIAL ENDOBRONCHIAL ULTRASOUND  04/26/2021  ? Procedure: RADIAL  ENDOBRONCHIAL ULTRASOUND;  Surgeon: Garner Nash, DO;  Location: [place] [facility];  Service: Pulmonary;;  ? ? ?Allergies ? ?Allergies  ?Allergen Reactions  ? Omega-3 Fatty Acids Hives and Itching  ? Benazepril Other (See Comments)  ?  hyperkalemia  ? Fish Allergy Itching  ? ? ?History of Present Illness  ?  ?71 year old male with the above complex past medical history including CAD s/p multiple PCI's, most recently in 01/2021, ICM, chronic combined systolic and diastolic heart failure (EF 25 to 30%), VT s/p ablation in 01/2021 with recurrence amiodarone and mexiletine (follows with EP), paroxysmal atrial flutter on Eliquis, hypertension, hyperlipidemia, type 2 diabetes, OSA on CPAP, ILD, lung cancer s/p chemoradiation, and osteomyelitis s/p left second toe amputation. ? ?He has a history of extensive multivessel CAD s/p multiple PCI's, most recently in August 2022 in the setting of ACS.  He has had multiple admissions over the last year for a variety of issues, but several have been in the setting of VT. He was initially on amiodarone but this was stopped in the setting of ILD.  He had recurrent VT on sotalol and mexiletine.  S/p ICD 08/2020.  He ultimately  underwent VT ablation in August 2022.  He was hospitalized later that month in the setting of NSTEMI.  Echocardiogram showed EF 40 to 45%, multiple wall motion abnormalities, G1 DD.  Cardiac catheterization showed 100% stenosis followed by 80% stenosis of RPAV.  He underwent difficult but successful PCI/DES to this lesion.  Near the completion of the procedure, patient had change in responsiveness as well as left-sided weakness and facial droop. CT head showed no acute findings.  MRI brain did confirm acute infarct of the right thalamus with small punctuate areas of acute infarct in the frontal lobes bilaterally.  During hospital admission he was also found to have a lung nodule concerning for cancer.  He was admitted with recurrent VTE in September 2022 and October  2022.  Echo during hospitalization in October 2022 showed EF 25 to 30%, drop in EF was thought to be due to recurrent VT.  Spironolactone was added.  Sotalol was discontinued and he was restarted on amiodarone. He und

## 2021-10-18 ENCOUNTER — Ambulatory Visit (INDEPENDENT_AMBULATORY_CARE_PROVIDER_SITE_OTHER): Payer: Medicare Other | Admitting: Nurse Practitioner

## 2021-10-18 ENCOUNTER — Encounter: Payer: Self-pay | Admitting: Nurse Practitioner

## 2021-10-18 VITALS — BP 90/60 | HR 77 | Ht 72.0 in | Wt 195.0 lb

## 2021-10-18 DIAGNOSIS — E785 Hyperlipidemia, unspecified: Secondary | ICD-10-CM | POA: Diagnosis not present

## 2021-10-18 DIAGNOSIS — Z20822 Contact with and (suspected) exposure to covid-19: Secondary | ICD-10-CM | POA: Diagnosis not present

## 2021-10-18 DIAGNOSIS — E118 Type 2 diabetes mellitus with unspecified complications: Secondary | ICD-10-CM | POA: Diagnosis not present

## 2021-10-18 DIAGNOSIS — I480 Paroxysmal atrial fibrillation: Secondary | ICD-10-CM

## 2021-10-18 DIAGNOSIS — C3412 Malignant neoplasm of upper lobe, left bronchus or lung: Secondary | ICD-10-CM

## 2021-10-18 DIAGNOSIS — Z95810 Presence of automatic (implantable) cardiac defibrillator: Secondary | ICD-10-CM | POA: Diagnosis not present

## 2021-10-18 DIAGNOSIS — I5042 Chronic combined systolic (congestive) and diastolic (congestive) heart failure: Secondary | ICD-10-CM

## 2021-10-18 DIAGNOSIS — I2510 Atherosclerotic heart disease of native coronary artery without angina pectoris: Secondary | ICD-10-CM

## 2021-10-18 DIAGNOSIS — G4733 Obstructive sleep apnea (adult) (pediatric): Secondary | ICD-10-CM | POA: Diagnosis not present

## 2021-10-18 DIAGNOSIS — J849 Interstitial pulmonary disease, unspecified: Secondary | ICD-10-CM | POA: Diagnosis not present

## 2021-10-18 DIAGNOSIS — I483 Typical atrial flutter: Secondary | ICD-10-CM | POA: Diagnosis not present

## 2021-10-18 DIAGNOSIS — I10 Essential (primary) hypertension: Secondary | ICD-10-CM | POA: Diagnosis not present

## 2021-10-18 DIAGNOSIS — Z794 Long term (current) use of insulin: Secondary | ICD-10-CM | POA: Diagnosis not present

## 2021-10-18 DIAGNOSIS — I255 Ischemic cardiomyopathy: Secondary | ICD-10-CM | POA: Diagnosis not present

## 2021-10-18 DIAGNOSIS — R911 Solitary pulmonary nodule: Secondary | ICD-10-CM

## 2021-10-18 DIAGNOSIS — I472 Ventricular tachycardia, unspecified: Secondary | ICD-10-CM | POA: Diagnosis not present

## 2021-10-18 LAB — CBC
Hematocrit: 38.2 % (ref 37.5–51.0)
Hemoglobin: 12 g/dL — ABNORMAL LOW (ref 13.0–17.7)
MCH: 26.1 pg — ABNORMAL LOW (ref 26.6–33.0)
MCHC: 31.4 g/dL — ABNORMAL LOW (ref 31.5–35.7)
MCV: 83 fL (ref 79–97)
Platelets: 351 10*3/uL (ref 150–450)
RBC: 4.6 x10E6/uL (ref 4.14–5.80)
RDW: 14.6 % (ref 11.6–15.4)
WBC: 8.9 10*3/uL (ref 3.4–10.8)

## 2021-10-18 LAB — COMPREHENSIVE METABOLIC PANEL
ALT: 42 IU/L (ref 0–44)
AST: 49 IU/L — ABNORMAL HIGH (ref 0–40)
Albumin/Globulin Ratio: 1.2 (ref 1.2–2.2)
Albumin: 3 g/dL — ABNORMAL LOW (ref 3.7–4.7)
Alkaline Phosphatase: 367 IU/L — ABNORMAL HIGH (ref 44–121)
BUN/Creatinine Ratio: 15 (ref 10–24)
BUN: 18 mg/dL (ref 8–27)
Bilirubin Total: 0.6 mg/dL (ref 0.0–1.2)
CO2: 22 mmol/L (ref 20–29)
Calcium: 9 mg/dL (ref 8.6–10.2)
Chloride: 104 mmol/L (ref 96–106)
Creatinine, Ser: 1.22 mg/dL (ref 0.76–1.27)
Globulin, Total: 2.5 g/dL (ref 1.5–4.5)
Glucose: 100 mg/dL — ABNORMAL HIGH (ref 70–99)
Potassium: 4.6 mmol/L (ref 3.5–5.2)
Sodium: 140 mmol/L (ref 134–144)
Total Protein: 5.5 g/dL — ABNORMAL LOW (ref 6.0–8.5)
eGFR: 63 mL/min/{1.73_m2} (ref >59)

## 2021-10-18 NOTE — Patient Instructions (Addendum)
Medication Instructions:  ?Decrease Spironolactone 12.5 mg every 3 days ? ?*If you need a refill on your cardiac medications before your next appointment, please call your pharmacy* ? ? ?Lab Work: ?Your physician recommends that you complete labs today ?CMP ?BUN ?CBC ? ?If you have labs (blood work) drawn today and your tests are completely normal, you will receive your results only by: ?MyChart Message (if you have MyChart) OR ?A paper copy in the mail ?If you have any lab test that is abnormal or we need to change your treatment, we will call you to review the results. ? ? ?Testing/Procedures: ?NONE ordered at this time of appointment   ? ? ?Follow-Up: ?At [facility], you and your health needs are our priority.  As part of our continuing mission to provide you with exceptional heart care, we have created designated Provider Care Teams.  These Care Teams include your primary Cardiologist (physician) and Advanced Practice Providers (APPs -  Physician Assistants and Nurse Practitioners) who all work together to provide you with the care you need, when you need it. ? ?We recommend signing up for the patient portal called "MyChart".  Sign up information is provided on this After Visit Summary.  MyChart is used to connect with patients for Virtual Visits (Telemedicine).  Patients are able to view lab/test results, encounter notes, upcoming appointments, etc.  Non-urgent messages can be sent to your provider as well.   ?To learn more about what you can do with MyChart, go to [email or web address].   ? ?Your next appointment:   ?1 week(s) ? ?The format for your next appointment:   ?In Person ? ?Provider:   ?Shelva Majestic, MD  or DOD DR     ? ?If primary card or EP is not listed click here to update    :1}  ? ? ?Other Instructions ?Monitor Blood Pressure daily ? ?Heart Failure Education: ? ?Weigh yourself EVERY morning after you go to the bathroom but before you eat or drink anything. Write this number down in a  weight log/diary. If you gain 3 pounds overnight or 5 pounds in a week, call the office. ?Take your medicines as prescribed. If you have concerns about your medications, please call us before you stop taking them. ?Eat low salt foods--Limit salt (sodium) to 2000 mg per day. This will help prevent your body from holding onto fluid. Read food labels as many processed foods have a lot of sodium, especially canned goods and prepackaged meats. If you would like some assistance choosing low sodium foods, we would be happy to set you up with a nutritionist. ?Limit all fluids for the day to less than 2 liters (64 ounces). Fluid includes all drinks, coffee, juice, ice chips, soup, jello, and all other liquids. ?Stay as active as you can everyday. Staying active will give you more energy and make your muscles stronger. Start with 5 minutes at a time and work your way up to 30 minutes a day. Break up your activities--do some in the morning and some in the afternoon. Start with 3 days per week and work your way up to 5 days as you can.  If you have chest pain, feel short of breath, dizzy, or lightheaded, STOP. If you don't feel better after a short rest, call 911. If you do feel better, call the office to let us know you have symptoms with exercise. ? ?Important Information About Sugar ? ? ? ? ? ? ?

## 2021-10-18 NOTE — Telephone Encounter (Signed)
Spoke to patient about Dr. Curt Bears recommendations. Patient voiced understanding. Will forward to [facility] clinic RN.  ?

## 2021-10-20 ENCOUNTER — Telehealth: Payer: Self-pay

## 2021-10-20 NOTE — Telephone Encounter (Signed)
Lmom, waiting on a return to discuss lab results and recommendations.  ?

## 2021-10-21 ENCOUNTER — Ambulatory Visit (INDEPENDENT_AMBULATORY_CARE_PROVIDER_SITE_OTHER): Payer: Medicare Other | Admitting: *Deleted

## 2021-10-21 ENCOUNTER — Telehealth: Payer: Self-pay

## 2021-10-21 DIAGNOSIS — E119 Type 2 diabetes mellitus without complications: Secondary | ICD-10-CM

## 2021-10-21 DIAGNOSIS — J418 Mixed simple and mucopurulent chronic bronchitis: Secondary | ICD-10-CM

## 2021-10-21 DIAGNOSIS — I5022 Chronic systolic (congestive) heart failure: Secondary | ICD-10-CM

## 2021-10-21 DIAGNOSIS — Z20822 Contact with and (suspected) exposure to covid-19: Secondary | ICD-10-CM | POA: Diagnosis not present

## 2021-10-21 NOTE — Chronic Care Management (AMB) (Signed)
?Chronic Care Management  ? ?CCM RN Visit Note ? ?10/21/2021 ?Name: John [name] MRN: [id number] DOB: 1950-07-30 ? ?Subjective: ?John [name] is a 71 y.o. year old male who is a primary care patient of Pickard, John Mcgee, MD. The care management team was consulted for assistance with disease management and care coordination needs.   ? ?Engaged with patient by telephone for follow up visit in response to provider referral for case management and/or care coordination services.  ? ?Consent to Services:  ?The patient was given information about Chronic Care Management services, agreed to services, and gave verbal consent prior to initiation of services.  Please see initial visit note for detailed documentation.  ? ?Patient agreed to services and verbal consent obtained.  ? ?Assessment: Review of patient past medical history, allergies, medications, health status, including review of consultants reports, laboratory and other test data, was performed as part of comprehensive evaluation and provision of chronic care management services.  ? ?SDOH (Social Determinants of Health) assessments and interventions performed:   ? ?CCM Care Plan ? ?Allergies  ?Allergen Reactions  ? Omega-3 Fatty Acids Hives and Itching  ? Benazepril Other (See Comments)  ?  hyperkalemia  ? Fish Allergy Itching  ? ? ?Outpatient Encounter Medications as of 10/21/2021  ?Medication Sig  ? albuterol (VENTOLIN HFA) 108 (90 Base) MCG/ACT inhaler Inhale 2 puffs into the lungs every 6 (six) hours as needed for wheezing or shortness of breath.  ? amiodarone (PACERONE) 200 MG tablet Take 1 tablet (200 mg total) by mouth daily.  ? apixaban (ELIQUIS) 5 MG TABS tablet Take 1 tablet (5 mg total) by mouth 2 (two) times daily.  ? clopidogrel (PLAVIX) 75 MG tablet Take 1 tablet (75 mg total) by mouth daily with breakfast.  ? Fluticasone Furoate (ARNUITY ELLIPTA) 200 MCG/ACT AEPB Inhale 1 puff into the lungs daily.  ? insulin aspart (NOVOLOG FLEXPEN) 100 UNIT/ML  FlexPen INJECT 5-20 UNITS SUBCUTANEOUSLY WITH LUNCH AND DINNER (Patient taking differently: Inject 6-8 Units into the skin daily as needed for high blood sugar.)  ? Insulin Pen Needle (NOVOFINE PEN NEEDLE) 32G X 6 MM MISC 1 each by Does not apply route daily.  ? JARDIANCE 25 MG TABS tablet TAKE 1 TABLET BY MOUTH EVERY DAY  ? LEVEMIR FLEXTOUCH 100 UNIT/ML FlexTouch Pen INJECT 46 UNITS INTO THE SKIN DAILY. DX:E11.9  ? mexiletine (MEXITIL) 150 MG capsule TAKE 2 CAPSULES (300 MG TOTAL) BY MOUTH EVERY 12 (TWELVE) HOURS.  ? montelukast (SINGULAIR) 10 MG tablet Take 1 tablet (10 mg total) by mouth daily.  ? nitroGLYCERIN (NITROSTAT) 0.4 MG SL tablet PLACE 1 TABLET (0.4 MG TOTAL) UNDER THE TONGUE EVERY 5 (FIVE) MINUTES AS NEEDED FOR CHEST PAIN.  ? ONETOUCH ULTRA test strip USE TO CHECK BLOOD SUGAR 3 TIMES A DAY (E11.9)  ? rosuvastatin (CRESTOR) 40 MG tablet Take 1 tablet (40 mg total) by mouth daily.  ? sacubitril-valsartan (ENTRESTO) 49-51 MG Take 1 tablet by mouth 2 (two) times daily.  ? spironolactone (ALDACTONE) 25 MG tablet Take 1 tablet (25 mg total) by mouth every other day. (Patient taking differently: Take 12.5 mg by mouth every other day. Take 12.5 mg q 3 days)  ? amoxicillin (AMOXIL) 500 MG capsule Take by mouth. (Patient not taking: Reported on 10/21/2021)  ? metoprolol succinate (TOPROL-XL) 50 MG 24 hr tablet Take 1 tablet (50 mg total) by mouth daily. Take with or immediately following a meal.  ? ?No facility-administered encounter medications on file as of  10/21/2021.  ? ? ?Patient Active Problem List  ? Diagnosis Date Noted  ? DVT of left axillary vein, acute ([place]) 05/05/2021  ? Embolic stroke ([place]) 03/11/3006  ? History of non-ST elevation myocardial infarction (NSTEMI) 02/14/2021  ? Chronic systolic CHF (congestive heart failure) ([place]) 02/08/2021  ? VT (ventricular tachycardia) ([place]) 01/26/2021  ? Coagulation defect ([place]) 12/22/2020  ? Non-ST elevation (NSTEMI) myocardial infarction [facility])   ? Capsulitis  05/21/2020  ? Thoracic aortic aneurysm ([place]) 02/29/2020  ? Obesity (BMI 30.0-34.9) 02/29/2020  ? Malignant neoplasm of upper lobe of left lung ([place]) 02/19/2020  ? Former smoker 02/19/2020  ? Acoustic neuroma ([place]) 09/11/2017  ? Asymmetrical sensorineural hearing loss 09/11/2017  ? Anticoagulation adequate 06/06/2016  ? Hyperlipidemia LDL goal <70 05/20/2015  ? OSA on CPAP 07/23/2013  ? Hypertension   ? Hyperlipidemia   ? Tobacco abuse 08/18/2012  ? CAD, multiple prior RCA PCI's. Last cath 2010, Myoview low risk Oct 2012 02/26/2012  ? DM type 2, goal HbA1c < 7% (HCC) 02/26/2012  ? HTN (hypertension) 02/26/2012  ? COPD (chronic obstructive pulmonary disease) ([place]) 02/26/2012  ? Smoking, quit one week ago 02/26/2012  ? GERD (gastroesophageal reflux disease) 02/26/2012  ? ? ?Conditions to be addressed/monitored:CHF, COPD, and DMII ? ?Care Plan : RN Care Manager Plan of Care  ?Updates made by Kassie Mends, RN since 10/21/2021 12:00 AM  ?  ? ?Problem: No plan of care established for management of chronic disease states  (DM2, COPD, CHF, HTN, Cancer)   ?Priority: High  ?  ? ?Long-Range Goal: Development of plan of care for chronic disease management   (DM2, COPD, CHF, HTN, Cancer)   ?Start Date: 05/23/2021  ?Expected End Date: 04/19/2022  ?Priority: High  ?Note:   ?Current Barriers:  ?Knowledge Deficits related to plan of care for management of CHF, COPD, and DMII  ?Patient reports he lives with spouse, is independent with all aspects of his care, pt drives and has had no issues recently getting to appointments, has a cane that he uses as needed when he leaves home, reports has all medications and taking as prescribed and able to afford, pt reports he has CPAP and had sleep study, is in process of getting a new CPAP and there is 4-5 month waiting list, pt states he is using the same CPAP at present, has inhalers to use for COPD, checks CBG BID with am ranges 90-110, pm ranges 140-200.  Reports he does weigh daily with  weight today 194 pounds, has new diagnosis lung cancer few weeks ago and reports "stage 1 and started radiation and has now finished", reports no advanced directives and in process of completing with assistance at oncology.  Patient reports he got new insoles for her shoes, patient declined palliative care.   ?Patient reports he has lost weight recently as his appetite has not been as good, blood pressure has been low recently, saw cardiologist on 5/2 and diuretic decreased and patient instructed to check blood pressure daily and record and follow up with cardiologist on 10/25/21.  Patient states he is fatigued and never feels like he has any energy. Blood pressure today 80/57, pt states he was instructed to go to ED if blood pressure gets too low or if feels like he is going to pass out. ?RNCM Clinical Goal(s):  ?Patient will verbalize understanding of plan for management of COPD and DMII as evidenced by patient report, review EHR and  through collaboration with RN Care manager, provider,  and care team.  ? ?Interventions: ?1:1 collaboration with primary care provider regarding development and update of comprehensive plan of care as evidenced by provider attestation and co-signature ?Inter-disciplinary care team collaboration (see longitudinal plan of care) ?Evaluation of current treatment plan related to  self management and patient's adherence to plan as established by provider ? ? ?COPD Interventions:  (Status:  New goal.) Long Term Goal ?Advised patient to self assesses COPD action plan zone and make appointment with provider if in the yellow zone for 48 hours without improvement ?Discussed the importance of adequate rest and management of fatigue with COPD ? ? ?Diabetes Interventions:  (Status:  New goal.) Long Term Goal ?Assessed patient's understanding of A1c goal: <7% ?Reviewed medications with patient and discussed importance of medication adherence ?Review of patient status, including review of consultants  reports, relevant laboratory and other test results, and medications completed ?Reviewed carbohydrate modified diet ?Reviewed CBG log with patient ?Lab Results  ?Component Value Date  ? HGBA1C 6.9 (H) 04/18/2021  ?

## 2021-10-21 NOTE — Telephone Encounter (Signed)
Spoke with pt. Pt was notified of his lab results, recommendations to follow up with his PCP and follow up with cardiology as scheduled next week. Pt mentioned that his blood pressure was lower than his BP was when he came in our office this week. This week, pts BP was 90/60. This morning pts BP was 80/57. Pt is aware that he was told at his office visit to go to the ED if his symptoms worsened, if he had any falls, BP dropped ect. Pt stated he would just keep monitoring it and wasn't going to go to the ED yet. Pt was also advised to call his PCP to follow up with them. Pt said he doesn't feel any worse than he did at his appointment and he's going to monitor how he feels.  ?

## 2021-10-21 NOTE — Patient Instructions (Signed)
Visit Information ? ?Thank you for taking time to visit with me today. Please don't hesitate to contact me if I can be of assistance to you before our next scheduled telephone appointment. ? ?Following are the goals we discussed today:  ?Take all medications as prescribed ?Attend all scheduled provider appointments ?Perform all self care activities independently  ?Call provider office for new concerns or questions  ?check blood sugar at prescribed times: three times daily ?check feet daily for cuts, sores or redness ?enter blood sugar readings and medication or insulin into daily log ?trim toenails straight across ?drink 6 to 8 glasses of water each day ?fill half of plate with vegetables ?manage portion size ?keep feet up while sitting ?wash and dry feet carefully every day ?wear comfortable, well-fitting shoes ?identify and remove indoor air pollutants ?develop a rescue plan ?eliminate symptom triggers at home ?follow rescue plan if symptoms flare-up ?get at least 7 to 8 hours of sleep at night ?practice relaxation or meditation daily ?Weigh daily and keep a log ?Watch daily for swelling in your feet ?If you gain 3 pounds overnight or 5 pounds in a week, please call your doctor ?Practice good handwashing, avoid sick people ?Continue checking blood pressure daily and recording- as instructed by cardiologist ?Do not walk if you become dizzy, ask for assistance as needed ?Sit up slowly when rising out of bed or chair ?Follow up with cardiologist on 10/25/21 ?Go to ED if blood pressure gets any lower or you feel like you are going to pass out ?Call RN care manager for any questions at [phone] ? ?Our next appointment is by telephone on 11/18/21 at 230 pm ? ?Please call the care guide team at [phone] if you need to cancel or reschedule your appointment.  ? ?If you are experiencing a Mental Health or [place] or need someone to talk to, please call the Suicide and Crisis Lifeline: 988 ?call the [place]  National Suicide Prevention Lifeline: 2566459319 or TTY: (432)182-9426 TTY 854-413-1288) to talk to a trained counselor ?call 1-800-273-TALK (toll free, 24 hour hotline) ?go to [facility] [address], [place] [phone]) ?call 911  ? ?Patient verbalizes understanding of instructions and care plan provided today and agrees to view in [place]. Active MyChart status confirmed with patient.   ? ?Jacqlyn Larsen RNC, BSN ?RN Case Manager ?[place] ?[phone] ? ?

## 2021-10-24 ENCOUNTER — Telehealth: Payer: Self-pay

## 2021-10-24 NOTE — Telephone Encounter (Signed)
Spoke with patient and ICM intro given.  Patient is agreeable to ICM monthly follow up.  Pt has OV 5/9 with Dr Harriet Masson to discuss low BP with symptoms.   ? ?Explained Dr Curt Bears reviewed 4/27 remote transmission and report suggesting possible fluid accumulation. Discussed fluid symptoms and he's had swelling of his feet.  He has unintentional weight loss and denies SOB.   ? ?Assisted sending updated remote transmission today, 5/8 and report suggesting possible fluid accumulation starting 08/28/2021.  Pt currently taking Spironolactone.   ? ?Advised will send remote transmission to Dr Harriet Masson for review for 5/9 OV.  Advised will recheck fluid levels 5/17 and encouraged to call for fluid symptoms if needed.    ? ?10/24/2021 Optivol suggesting possible fluid accumulation starting 3/12.   ? ? ?

## 2021-10-24 NOTE — Telephone Encounter (Signed)
Referred to [facility] clinic by Dr Curt Bears due to [facility] suggesting possible fluid accumulation on 4/27 remote transmission report.   Attempted call to patient for ICM intro and left message for return call.   ? ?10/13/2021 Optivol thoracic impedance suggesting possible fluid accumulation starting 3/9 and trending toward baseline.   ? ?

## 2021-10-25 ENCOUNTER — Encounter: Payer: Self-pay | Admitting: Cardiology

## 2021-10-25 ENCOUNTER — Ambulatory Visit (INDEPENDENT_AMBULATORY_CARE_PROVIDER_SITE_OTHER): Payer: Medicare Other | Admitting: Cardiology

## 2021-10-25 VITALS — BP 90/64 | HR 85 | Ht 72.0 in | Wt 192.6 lb

## 2021-10-25 DIAGNOSIS — E669 Obesity, unspecified: Secondary | ICD-10-CM | POA: Diagnosis not present

## 2021-10-25 DIAGNOSIS — I10 Essential (primary) hypertension: Secondary | ICD-10-CM

## 2021-10-25 DIAGNOSIS — R0602 Shortness of breath: Secondary | ICD-10-CM

## 2021-10-25 DIAGNOSIS — E782 Mixed hyperlipidemia: Secondary | ICD-10-CM | POA: Diagnosis not present

## 2021-10-25 MED ORDER — ENTRESTO 24-26 MG PO TABS: 1.0000 | ORAL_TABLET | Freq: Two times a day (BID) | ORAL | 3 refills | Status: DC

## 2021-10-25 MED ORDER — FUROSEMIDE 40 MG PO TABS: 40.0000 mg | ORAL_TABLET | ORAL | 0 refills | Status: DC

## 2021-10-25 NOTE — Progress Notes (Signed)
?Cardiology Office Note:   ? ?Date:  10/27/2021  ? ?ID:  John [name], DOB 1950/09/23, MRN [id number] ? ?PCP:  [name], MD  ?Cardiologist:  Shelva Majestic, MD  ?Electrophysiologist:  Will Meredith Leeds, MD  ? ?Referring MD: [name], MD  ? ?No chief complaint on file. ?Dizziness and balance issues ?Low blood pressure ? ?History of Present Illness:   ? ?John [name] is a 71 y.o. male with a hx of increased dizziness over the last several days. Over the last few weeks he has been having increased leg swelling and weakness. He has been struggling with some of these symptoms since his stroke but it has been worsening. Patient was instructed at his last visit to cut the spironolactone in half but he is not able to cut the pills due to their size so he instead took none of the medication for 3 days and then took a whole pill (25mg ) on day 4. ? ?He has increased weakness that some days causes him depressive symptoms and he is tired of his activity being limited. He used to work outside a lot but has pretty much kept himself in the house because he has had several falls over the last few months with the most recent fall about 3 weeks ago.  ? ?Past Medical History:  ?Diagnosis Date  ? Arm DVT (deep venous thromboembolism), acute, left ([place])   ? while off eliquis for bronchoscopy 2022  ? Atrial flutter [facility])   ? s/p cardioversion  ? Coronary artery disease   ? Diabetes mellitus   ? GERD (gastroesophageal reflux disease)   ? History of nuclear stress test 04/04/2011  ? lexiscan; mod-large in size fixed inferolateral defect (scar); non-diagnostic for ischemia; low risk scan   ? Hyperlipidemia   ? Hypertension   ? Ischemic cardiomyopathy   ? Left foot drop   ? r/t past disk srugery - uses Kevlar brace  ? Lung cancer ([place]) 04/26/2021  ? Myocardial infarction [facility])   ? posterior MI  ? Osteomyelitis ([place])   ? s/p left 2nd toe amputation in 01/2021  ? Pulmonary nodule   ? Recurrent ventricular tachycardia  ([place])   ? Shortness of breath   ? Sleep apnea   ? on CPAP; 04/28/2007 split-night - AHI during total sleep 44.43/hr and REM 72.56/hr  ? ? ?Past Surgical History:  ?Procedure Laterality Date  ? AMPUTATION TOE Left 02/10/2021  ? Procedure: AMPUTATION  LEFT SECOND TOE;  Surgeon: Edrick Kins, DPM;  Location: [place];  Service: Podiatry;  Laterality: Left;  ? [place]  ? BRONCHIAL BIOPSY  04/26/2021  ? Procedure: BRONCHIAL BIOPSIES;  Surgeon: Garner Nash, DO;  Location: [place] [facility];  Service: Pulmonary;;  ? BRONCHIAL BRUSHINGS  04/26/2021  ? Procedure: BRONCHIAL BRUSHINGS;  Surgeon: Garner Nash, DO;  Location: [place] [facility];  Service: Pulmonary;;  ? BRONCHIAL NEEDLE ASPIRATION BIOPSY  04/26/2021  ? Procedure: BRONCHIAL NEEDLE ASPIRATION BIOPSIES;  Surgeon: Garner Nash, DO;  Location: [place] [facility];  Service: Pulmonary;;  ? CARDIAC CATHETERIZATION  2010  ? 6 stents total  ? CARDIAC CATHETERIZATION  01/2000  ? percutaneous transluminal coronary balloon angioplasty of mid RCA stenotic lesion  ? CARDIAC CATHETERIZATION  06/2006  ? no stenting; ischemic cardiomyopathy, EF 40-45%  ? CARDIOVERSION N/A 07/28/2016  ? Procedure: CARDIOVERSION;  Surgeon: Troy Sine, MD;  Location: [place];  Service: Cardiovascular;  Laterality: N/A;  ? CORONARY ANGIOPLASTY  09/1998  ?  mid-distal RCA balloon dilatation, 4.5 & 5.0 stents   ? CORONARY ANGIOPLASTY WITH STENT PLACEMENT  03/1994  ? angioplasty & stenting (non-DES) of circumflex/prox ramus intermedius  ? CORONARY ANGIOPLASTY WITH STENT PLACEMENT  10/1994  ? large iliac [id number] stent to RCA  ? CORONARY ANGIOPLASTY WITH STENT PLACEMENT  12/2002  ? 4.64mm stents x2 of RCA  ? CORONARY ANGIOPLASTY WITH STENT PLACEMENT  01/2005  ? cutting balloon arthrectomy of distal RCA & Cypher DES 3.5x13; cutting balloon arthrectomy of mid RCA with Cypher DES 3.5x18  ? CORONARY ANGIOPLASTY WITH STENT PLACEMENT  11/2008  ? stenting of mid RCA with 4.0x41mm driver, non-DES  ? CORONARY  BALLOON ANGIOPLASTY N/A 02/15/2021  ? Procedure: CORONARY BALLOON ANGIOPLASTY;  Surgeon: Troy Sine, MD;  Location: [place] [facility];  Service: Cardiovascular;  Laterality: N/A;  ? FIDUCIAL MARKER PLACEMENT  04/26/2021  ? Procedure: FIDUCIAL MARKER PLACEMENT;  Surgeon: Garner Nash, DO;  Location: [place];  Service: Pulmonary;;  ? ICD IMPLANT N/A 08/20/2020  ? Procedure: ICD IMPLANT;  Surgeon: Constance Haw, MD;  Location: [place] [facility];  Service: Cardiovascular;  Laterality: N/A;  ? INTRAVASCULAR PRESSURE WIRE/FFR STUDY N/A 03/02/2020  ? Procedure: INTRAVASCULAR PRESSURE WIRE/FFR STUDY;  Surgeon: Leonie Man, MD;  Location: [place] [facility];  Service: Cardiovascular;  Laterality: N/A;  ? LEFT HEART CATH AND CORONARY ANGIOGRAPHY N/A 03/02/2020  ? Procedure: LEFT HEART CATH AND CORONARY ANGIOGRAPHY;  Surgeon: Leonie Man, MD;  Location: [place] [facility];  Service: Cardiovascular;  Laterality: N/A;  ? LEFT HEART CATH AND CORONARY ANGIOGRAPHY N/A 08/19/2020  ? Procedure: LEFT HEART CATH AND CORONARY ANGIOGRAPHY;  Surgeon: Lorretta Harp, MD;  Location: [place] [facility];  Service: Cardiovascular;  Laterality: N/A;  ? LEFT HEART CATH AND CORONARY ANGIOGRAPHY N/A 02/15/2021  ? Procedure: LEFT HEART CATH AND CORONARY ANGIOGRAPHY;  Surgeon: Troy Sine, MD;  Location: [place] [facility];  Service: Cardiovascular;  Laterality: N/A;  ? LEFT HEART CATHETERIZATION WITH CORONARY ANGIOGRAM N/A 02/27/2012  ? Procedure: LEFT HEART CATHETERIZATION WITH CORONARY ANGIOGRAM;  Surgeon: Lorretta Harp, MD;  Location: [facility];  Service: Cardiovascular;  Laterality: N/A;  ? TRANSTHORACIC ECHOCARDIOGRAM  07/29/2010  ? EF 50=55%, mod inf wall hypokinesis & mild post wall hypokinesis; LA mild-mod dilated; mild mitral annular calcif & mild MR; mild TR & elevated RV systolic pressure; AV mildly sclerotic; mild aortic root dilatation   ? V TACH ABLATION N/A 01/20/2021  ? Procedure: V TACH ABLATION;   Surgeon: Vickie Epley, MD;  Location: [place] [facility];  Service: Cardiovascular;  Laterality: N/A;  ? VIDEO BRONCHOSCOPY WITH ENDOBRONCHIAL NAVIGATION Left 04/26/2021  ? Procedure: VIDEO BRONCHOSCOPY WITH ENDOBRONCHIAL NAVIGATION;  Surgeon: Garner Nash, DO;  Location: [place];  Service: Pulmonary;  Laterality: Left;  ION w/ fiducial  ? VIDEO BRONCHOSCOPY WITH RADIAL ENDOBRONCHIAL ULTRASOUND  04/26/2021  ? Procedure: RADIAL ENDOBRONCHIAL ULTRASOUND;  Surgeon: Garner Nash, DO;  Location: [place] [facility];  Service: Pulmonary;;  ? ? ?Current Medications: ?Current Meds  ?Medication Sig  ? albuterol (VENTOLIN HFA) 108 (90 Base) MCG/ACT inhaler Inhale 2 puffs into the lungs every 6 (six) hours as needed for wheezing or shortness of breath.  ? amiodarone (PACERONE) 200 MG tablet Take 1 tablet (200 mg total) by mouth daily.  ? apixaban (ELIQUIS) 5 MG TABS tablet Take 1 tablet (5 mg total) by mouth 2 (two) times daily.  ? clopidogrel (PLAVIX) 75 MG  tablet Take 1 tablet (75 mg total) by mouth daily with breakfast.  ? Fluticasone Furoate (ARNUITY ELLIPTA) 200 MCG/ACT AEPB Inhale 1 puff into the lungs daily.  ? furosemide (LASIX) 40 MG tablet Take 1 tablet (40 mg total) by mouth every other day.  ? insulin aspart (NOVOLOG FLEXPEN) 100 UNIT/ML FlexPen INJECT 5-20 UNITS SUBCUTANEOUSLY WITH LUNCH AND DINNER (Patient taking differently: Inject 6-8 Units into the skin daily as needed for high blood sugar.)  ? Insulin Pen Needle (NOVOFINE PEN NEEDLE) 32G X 6 MM MISC 1 each by Does not apply route daily.  ? JARDIANCE 25 MG TABS tablet TAKE 1 TABLET BY MOUTH EVERY DAY  ? LEVEMIR FLEXTOUCH 100 UNIT/ML FlexTouch Pen INJECT 46 UNITS INTO THE SKIN DAILY. DX:E11.9  ? metoprolol succinate (TOPROL-XL) 50 MG 24 hr tablet Take 1 tablet (50 mg total) by mouth daily. Take with or immediately following a meal.  ? mexiletine (MEXITIL) 150 MG capsule TAKE 2 CAPSULES (300 MG TOTAL) BY MOUTH EVERY 12 (TWELVE) HOURS.  ? montelukast  (SINGULAIR) 10 MG tablet Take 1 tablet (10 mg total) by mouth daily.  ? nitroGLYCERIN (NITROSTAT) 0.4 MG SL tablet PLACE 1 TABLET (0.4 MG TOTAL) UNDER THE TONGUE EVERY 5 (FIVE) MINUTES AS NEEDED FOR CHEST PAIN.  ?

## 2021-10-25 NOTE — Patient Instructions (Signed)
Medication Instructions:  ?Your physician has recommended you make the following change in your medication:  ?STOP: Aldactone ?DECREASE: Entresto 24-26 mg twice daily ?START ON Thursday: Lasix 40 mg every other day for 5 days ?*If you need a refill on your cardiac medications before your next appointment, please call your pharmacy* ? ? ?Lab Work: ?None ?If you have labs (blood work) drawn today and your tests are completely normal, you will receive your results only by: ?MyChart Message (if you have MyChart) OR ?A paper copy in the mail ?If you have any lab test that is abnormal or we need to change your treatment, we will call you to review the results. ? ? ?Testing/Procedures: ?None ? ? ?Follow-Up: ?At [facility], you and your health needs are our priority.  As part of our continuing mission to provide you with exceptional heart care, we have created designated Provider Care Teams.  These Care Teams include your primary Cardiologist (physician) and Advanced Practice Providers (APPs -  Physician Assistants and Nurse Practitioners) who all work together to provide you with the care you need, when you need it. ? ?We recommend signing up for the patient portal called "MyChart".  Sign up information is provided on this After Visit Summary.  MyChart is used to connect with patients for Virtual Visits (Telemedicine).  Patients are able to view lab/test results, encounter notes, upcoming appointments, etc.  Non-urgent messages can be sent to your provider as well.   ?To learn more about what you can do with MyChart, go to [email or web address].   ? ?Your next appointment:   ?6 week(s) ? ?The format for your next appointment:   ?In Person ? ?Provider:   ?An APP ? ? ? ? ?Other Instructions ? ? ?Important Information About Sugar ? ? ? ? ?  ?

## 2021-10-27 ENCOUNTER — Other Ambulatory Visit: Payer: Self-pay | Admitting: Family Medicine

## 2021-10-27 DIAGNOSIS — R0602 Shortness of breath: Secondary | ICD-10-CM | POA: Insufficient documentation

## 2021-10-27 MED ORDER — ONETOUCH ULTRA VI STRP: ORAL_STRIP | 2 refills | Status: DC

## 2021-10-27 NOTE — Progress Notes (Signed)
Remote ICD transmission.   

## 2021-10-27 NOTE — Telephone Encounter (Signed)
Requested Prescriptions  ?Pending Prescriptions Disp Refills  ?? glucose blood (ONETOUCH ULTRA) test strip 100 strip 2  ?  Sig: Use as instructed  ?  ? Endocrinology: Diabetes - Testing Supplies Passed - 10/27/2021  1:47 PM  ?  ?  Passed - Valid encounter within last 12 months  ?  Recent Outpatient Visits   ?      ? 5 months ago Arm DVT (deep venous thromboembolism), acute, left ([place])  ? [facility] Pickard, [name], MD  ? 5 months ago DVT of left axillary vein, acute ([place])  ? [place], Jessica A, NP  ? 7 months ago ASCVD (arteriosclerotic cardiovascular disease)  ? [facility] Pickard, [name], MD  ? 1 year ago Right arm pain  ? [facility] Pickard, [name], MD  ? 1 year ago Injury of left knee, initial encounter  ? [place], Matthias Hughs, FNP  ?  ?  ?Future Appointments   ?        ? In 1 month Monge, Helane Gunther, NP [facility]  ?  ? ?  ?  ?  ? ? ?

## 2021-10-27 NOTE — Telephone Encounter (Signed)
Received fax from [facility] in [place] requesting a refill on [facility] ULTRA test strip [[id number]]  ?  Order Details ?Dose, Route, Frequency: As Directed  ?Dispense Quantity: 100 strip Refills: 2   ?     ?Sig: USE TO CHECK BLOOD SUGAR 3 TIMES A DAY (E11.9)  ?     ?Start Date: 05/06/21 End Date: --  ?Written Date: 05/06/21 Expiration Date: 05/06/22  ?Original Order:  glucose blood (ONETOUCH ULTRA) test strip [[id number]]  ?Providers ? ?Ordering and Authorizing Provider:   ?[name], MD  ?[address] [place], [place] [address]  ?Phone:  [phone]   Fax:  [phone]  ?DEA #:  [id number]   NPI:  [id number]   ?   ?Ordering User:  Quentin Angst, CMA   ?   ? ?Pharmacy ? ?[id number] [place], [place]  ?[place], [place] [address]   ? ?

## 2021-11-02 ENCOUNTER — Other Ambulatory Visit: Payer: Self-pay | Admitting: Family Medicine

## 2021-11-02 NOTE — Telephone Encounter (Signed)
Requested Prescriptions  ?Pending Prescriptions Disp Refills  ?? JARDIANCE 25 MG TABS tablet [Pharmacy Med Name: JARDIANCE 25 MG TABLET] 30 tablet 0  ?  Sig: TAKE 1 TABLET BY MOUTH EVERY DAY  ?  ? Endocrinology:  Diabetes - SGLT2 Inhibitors Failed - 11/02/2021  1:55 AM  ?  ?  Failed - HBA1C is between 0 and 7.9 and within 180 days  ?  Hgb A1c MFr Bld  ?Date Value Ref Range Status  ?04/18/2021 6.9 (H) 4.8 - 5.6 % Final  ?  Comment:  ?  (NOTE) ?Pre diabetes:          5.7%-6.4% ? ?Diabetes:              >6.4% ? ?Glycemic control for   <7.0% ?adults with diabetes ?  ?   ?  ?  Passed - Cr in normal range and within 360 days  ?  Creat  ?Date Value Ref Range Status  ?09/15/2019 0.96 0.70 - 1.18 mg/dL Final  ?  Comment:  ?  For patients >34 years of age, the reference limit ?for Creatinine is approximately 13% higher for people ?identified as African-American. ?. ?  ? ?Creatinine, Ser  ?Date Value Ref Range Status  ?10/18/2021 1.22 0.76 - 1.27 mg/dL Final  ?   ?  ?  Passed - eGFR in normal range and within 360 days  ?  GFR, Est African American  ?Date Value Ref Range Status  ?09/15/2019 92 > OR = 60 mL/min/1.71m Final  ? ?GFR calc Af Amer  ?Date Value Ref Range Status  ?02/28/2020 >60 >60 mL/min Final  ? ?GFR, Est Non African American  ?Date Value Ref Range Status  ?09/15/2019 80 > OR = 60 mL/min/1.7104mFinal  ? ?GFR, Estimated  ?Date Value Ref Range Status  ?04/29/2021 >60 >60 mL/min Final  ?  Comment:  ?  (NOTE) ?Calculated using the CKD-EPI Creatinine Equation (2021) ?  ? ?eGFR  ?Date Value Ref Range Status  ?10/18/2021 63 >59 mL/min/1.73 Final  ?   ?  ?  Passed - Valid encounter within last 6 months  ?  Recent Outpatient Visits   ?      ? 5 months ago Arm DVT (deep venous thromboembolism), acute, [place] [facility] ? [facility] [place], WaCammie McgeeMD  ? 6 months ago DVT of left axillary vein, [place] ([facility] ? BrGardenJessica A, NP  ? 8 months ago ASCVD (arteriosclerotic  cardiovascular disease)  ? [facility] [place], WaCammie McgeeMD  ? 1 year ago Right arm pain  ? [facility] [place], WaCammie McgeeMD  ? 1 year ago Injury of left knee, initial encounter  ? Br[facility]thias HughsFNP  ?  ?  ?Future Appointments   ?        ? In 1 month Monge, EmHelane GuntherNP [facility], [facility]  ?  ? ?  ?  ?  ? ?

## 2021-11-04 NOTE — Progress Notes (Signed)
No ICM remote transmission received for 11/02/2021 and next ICM transmission scheduled for 11/15/2021.

## 2021-11-05 ENCOUNTER — Emergency Department (HOSPITAL_COMMUNITY): Payer: Medicare Other

## 2021-11-05 ENCOUNTER — Observation Stay (HOSPITAL_COMMUNITY)
Admission: EM | Admit: 2021-11-05 | Discharge: 2021-11-06 | Disposition: A | Discharge status: Home / Self Care | Payer: Medicare Other | Attending: Family Medicine | Admitting: Family Medicine

## 2021-11-05 ENCOUNTER — Other Ambulatory Visit: Payer: Self-pay

## 2021-11-05 ENCOUNTER — Encounter (HOSPITAL_COMMUNITY): Payer: Self-pay | Admitting: Emergency Medicine

## 2021-11-05 DIAGNOSIS — E10649 Type 1 diabetes mellitus with hypoglycemia without coma: Secondary | ICD-10-CM | POA: Diagnosis not present

## 2021-11-05 DIAGNOSIS — E162 Hypoglycemia, unspecified: Secondary | ICD-10-CM | POA: Diagnosis not present

## 2021-11-05 DIAGNOSIS — Z7189 Other specified counseling: Secondary | ICD-10-CM

## 2021-11-05 DIAGNOSIS — R4781 Slurred speech: Secondary | ICD-10-CM | POA: Diagnosis present

## 2021-11-05 DIAGNOSIS — I700 Atherosclerosis of aorta: Secondary | ICD-10-CM | POA: Diagnosis not present

## 2021-11-05 DIAGNOSIS — Z87891 Personal history of nicotine dependence: Secondary | ICD-10-CM | POA: Diagnosis not present

## 2021-11-05 DIAGNOSIS — Z7984 Long term (current) use of oral hypoglycemic drugs: Secondary | ICD-10-CM | POA: Diagnosis not present

## 2021-11-05 DIAGNOSIS — Z7902 Long term (current) use of antithrombotics/antiplatelets: Secondary | ICD-10-CM | POA: Diagnosis not present

## 2021-11-05 DIAGNOSIS — I2510 Atherosclerotic heart disease of native coronary artery without angina pectoris: Secondary | ICD-10-CM | POA: Diagnosis not present

## 2021-11-05 DIAGNOSIS — E109 Type 1 diabetes mellitus without complications: Secondary | ICD-10-CM | POA: Insufficient documentation

## 2021-11-05 DIAGNOSIS — I10 Essential (primary) hypertension: Secondary | ICD-10-CM | POA: Diagnosis present

## 2021-11-05 DIAGNOSIS — Z7901 Long term (current) use of anticoagulants: Secondary | ICD-10-CM | POA: Diagnosis not present

## 2021-11-05 DIAGNOSIS — R6889 Other general symptoms and signs: Secondary | ICD-10-CM | POA: Diagnosis not present

## 2021-11-05 DIAGNOSIS — Z743 Need for continuous supervision: Secondary | ICD-10-CM | POA: Diagnosis not present

## 2021-11-05 DIAGNOSIS — Z85118 Personal history of other malignant neoplasm of bronchus and lung: Secondary | ICD-10-CM | POA: Insufficient documentation

## 2021-11-05 DIAGNOSIS — I252 Old myocardial infarction: Secondary | ICD-10-CM | POA: Diagnosis not present

## 2021-11-05 DIAGNOSIS — I482 Chronic atrial fibrillation, unspecified: Secondary | ICD-10-CM | POA: Insufficient documentation

## 2021-11-05 DIAGNOSIS — E785 Hyperlipidemia, unspecified: Secondary | ICD-10-CM | POA: Diagnosis present

## 2021-11-05 DIAGNOSIS — R918 Other nonspecific abnormal finding of lung field: Secondary | ICD-10-CM | POA: Diagnosis not present

## 2021-11-05 DIAGNOSIS — R2681 Unsteadiness on feet: Secondary | ICD-10-CM | POA: Diagnosis not present

## 2021-11-05 DIAGNOSIS — I5022 Chronic systolic (congestive) heart failure: Secondary | ICD-10-CM | POA: Diagnosis not present

## 2021-11-05 DIAGNOSIS — R0902 Hypoxemia: Secondary | ICD-10-CM | POA: Diagnosis not present

## 2021-11-05 DIAGNOSIS — G4733 Obstructive sleep apnea (adult) (pediatric): Secondary | ICD-10-CM

## 2021-11-05 DIAGNOSIS — Z95810 Presence of automatic (implantable) cardiac defibrillator: Secondary | ICD-10-CM | POA: Insufficient documentation

## 2021-11-05 DIAGNOSIS — Z8673 Personal history of transient ischemic attack (TIA), and cerebral infarction without residual deficits: Secondary | ICD-10-CM | POA: Insufficient documentation

## 2021-11-05 DIAGNOSIS — I499 Cardiac arrhythmia, unspecified: Secondary | ICD-10-CM | POA: Diagnosis not present

## 2021-11-05 DIAGNOSIS — C3412 Malignant neoplasm of upper lobe, left bronchus or lung: Secondary | ICD-10-CM | POA: Diagnosis present

## 2021-11-05 DIAGNOSIS — Z794 Long term (current) use of insulin: Secondary | ICD-10-CM | POA: Insufficient documentation

## 2021-11-05 DIAGNOSIS — E11649 Type 2 diabetes mellitus with hypoglycemia without coma: Secondary | ICD-10-CM | POA: Diagnosis not present

## 2021-11-05 DIAGNOSIS — Z955 Presence of coronary angioplasty implant and graft: Secondary | ICD-10-CM | POA: Diagnosis not present

## 2021-11-05 DIAGNOSIS — M6281 Muscle weakness (generalized): Secondary | ICD-10-CM | POA: Insufficient documentation

## 2021-11-05 DIAGNOSIS — I517 Cardiomegaly: Secondary | ICD-10-CM | POA: Diagnosis not present

## 2021-11-05 DIAGNOSIS — I110 Hypertensive heart disease with heart failure: Secondary | ICD-10-CM | POA: Diagnosis not present

## 2021-11-05 DIAGNOSIS — R0789 Other chest pain: Secondary | ICD-10-CM | POA: Diagnosis not present

## 2021-11-05 DIAGNOSIS — R4182 Altered mental status, unspecified: Secondary | ICD-10-CM | POA: Diagnosis not present

## 2021-11-05 DIAGNOSIS — I213 ST elevation (STEMI) myocardial infarction of unspecified site: Secondary | ICD-10-CM | POA: Diagnosis not present

## 2021-11-05 DIAGNOSIS — Z79899 Other long term (current) drug therapy: Secondary | ICD-10-CM | POA: Diagnosis not present

## 2021-11-05 LAB — COMPREHENSIVE METABOLIC PANEL
ALT: 47 U/L — ABNORMAL HIGH (ref 0–44)
AST: 45 U/L — ABNORMAL HIGH (ref 15–41)
Albumin: 2 g/dL — ABNORMAL LOW (ref 3.5–5.0)
Alkaline Phosphatase: 335 U/L — ABNORMAL HIGH (ref 38–126)
Anion gap: 8 (ref 5–15)
BUN: 15 mg/dL (ref 8–23)
CO2: 24 mmol/L (ref 22–32)
Calcium: 8.6 mg/dL — ABNORMAL LOW (ref 8.9–10.3)
Chloride: 109 mmol/L (ref 98–111)
Creatinine, Ser: 1.12 mg/dL (ref 0.61–1.24)
GFR, Estimated: >60 mL/min (ref >60)
Glucose, Bld: 52 mg/dL — ABNORMAL LOW (ref 70–99)
Potassium: 3.5 mmol/L (ref 3.5–5.1)
Sodium: 141 mmol/L (ref 135–145)
Total Bilirubin: 0.4 mg/dL (ref 0.3–1.2)
Total Protein: 5.6 g/dL — ABNORMAL LOW (ref 6.5–8.1)

## 2021-11-05 LAB — CBG MONITORING, ED
Glucose-Capillary: 67 mg/dL — ABNORMAL LOW (ref 70–99)
Glucose-Capillary: 107 mg/dL — ABNORMAL HIGH (ref 70–99)
Glucose-Capillary: 106 mg/dL — ABNORMAL HIGH (ref 70–99)
Glucose-Capillary: 73 mg/dL (ref 70–99)
Glucose-Capillary: 60 mg/dL — ABNORMAL LOW (ref 70–99)
Glucose-Capillary: 146 mg/dL — ABNORMAL HIGH (ref 70–99)
Glucose-Capillary: 126 mg/dL — ABNORMAL HIGH (ref 70–99)
Glucose-Capillary: 107 mg/dL — ABNORMAL HIGH (ref 70–99)

## 2021-11-05 LAB — GLUCOSE, CAPILLARY
Glucose-Capillary: 97 mg/dL (ref 70–99)
Glucose-Capillary: 175 mg/dL — ABNORMAL HIGH (ref 70–99)

## 2021-11-05 LAB — CBC WITH DIFFERENTIAL/PLATELET
Abs Immature Granulocytes: 0.13 10*3/uL — ABNORMAL HIGH (ref 0.00–0.07)
Basophils Absolute: 0 10*3/uL (ref 0.0–0.1)
Basophils Relative: 1 %
Eosinophils Absolute: 0.1 10*3/uL (ref 0.0–0.5)
Eosinophils Relative: 1 %
HCT: 40.8 % (ref 39.0–52.0)
Hemoglobin: 11.9 g/dL — ABNORMAL LOW (ref 13.0–17.0)
Immature Granulocytes: 2 %
Lymphocytes Relative: 7 %
Lymphs Abs: 0.5 10*3/uL — ABNORMAL LOW (ref 0.7–4.0)
MCH: 26 pg (ref 26.0–34.0)
MCHC: 29.2 g/dL — ABNORMAL LOW (ref 30.0–36.0)
MCV: 89.1 fL (ref 80.0–100.0)
Monocytes Absolute: 0.6 10*3/uL (ref 0.1–1.0)
Monocytes Relative: 8 %
Neutro Abs: 6 10*3/uL (ref 1.7–7.7)
Neutrophils Relative %: 81 %
Platelets: 324 10*3/uL (ref 150–400)
RBC: 4.58 MIL/uL (ref 4.22–5.81)
RDW: 16.7 % — ABNORMAL HIGH (ref 11.5–15.5)
WBC: 7.3 10*3/uL (ref 4.0–10.5)
nRBC: 0 % (ref 0.0–0.2)

## 2021-11-05 LAB — TROPONIN I (HIGH SENSITIVITY)
Troponin I (High Sensitivity): 12 ng/L (ref <18)
Troponin I (High Sensitivity): 11 ng/L (ref <18)

## 2021-11-05 LAB — D-DIMER, QUANTITATIVE: D-Dimer, Quant: 0.79 ug/mL-FEU — ABNORMAL HIGH (ref 0.00–0.50)

## 2021-11-05 LAB — BRAIN NATRIURETIC PEPTIDE: B Natriuretic Peptide: 280.9 pg/mL — ABNORMAL HIGH (ref 0.0–100.0)

## 2021-11-05 MED ORDER — MONTELUKAST SODIUM 10 MG PO TABS
10.0000 mg | ORAL_TABLET | Freq: Every day | ORAL | Status: DC
  Administered 2021-11-05: 10 mg via ORAL
  Filled 2021-11-05: qty 1

## 2021-11-05 MED ORDER — DEXTROSE 50 % IV SOLN
1.0000 | Freq: Once | INTRAVENOUS | Status: AC
  Administered 2021-11-05: 50 mL via INTRAVENOUS
  Filled 2021-11-05: qty 50

## 2021-11-05 MED ORDER — IOHEXOL 300 MG/ML  SOLN
100.0000 mL | Freq: Once | INTRAMUSCULAR | Status: AC | PRN
  Administered 2021-11-05: 100 mL via INTRAVENOUS

## 2021-11-05 MED ORDER — APIXABAN 5 MG PO TABS
5.0000 mg | ORAL_TABLET | Freq: Two times a day (BID) | ORAL | Status: DC
  Administered 2021-11-05 – 2021-11-06 (×2): 5 mg via ORAL
  Filled 2021-11-05 (×2): qty 1

## 2021-11-05 MED ORDER — HYDRALAZINE HCL 20 MG/ML IJ SOLN: 5.0000 mg | INTRAMUSCULAR | Status: DC | PRN

## 2021-11-05 MED ORDER — ACETAMINOPHEN 325 MG PO TABS: 650.0000 mg | ORAL_TABLET | Freq: Four times a day (QID) | ORAL | Status: DC | PRN

## 2021-11-05 MED ORDER — ACETAMINOPHEN 650 MG RE SUPP: 650.0000 mg | Freq: Four times a day (QID) | RECTAL | Status: DC | PRN

## 2021-11-05 MED ORDER — LACTATED RINGERS IV BOLUS
500.0000 mL | Freq: Once | INTRAVENOUS | Status: AC
  Administered 2021-11-05: 500 mL via INTRAVENOUS

## 2021-11-05 MED ORDER — DEXTROSE 10 % IV SOLN: INTRAVENOUS | Status: DC

## 2021-11-05 MED ORDER — ALBUTEROL SULFATE (2.5 MG/3ML) 0.083% IN NEBU: 2.5000 mg | INHALATION_SOLUTION | Freq: Four times a day (QID) | RESPIRATORY_TRACT | Status: DC | PRN

## 2021-11-05 MED ORDER — POLYETHYLENE GLYCOL 3350 17 G PO PACK: 17.0000 g | PACK | Freq: Every day | ORAL | Status: DC | PRN

## 2021-11-05 MED ORDER — DEXTROSE 5 % IV SOLN
Freq: Once | INTRAVENOUS | Status: AC
Start: 2021-11-05 — End: 2021-11-05

## 2021-11-05 MED ORDER — BUDESONIDE 0.5 MG/2ML IN SUSP
0.5000 mg | Freq: Two times a day (BID) | RESPIRATORY_TRACT | Status: DC
  Administered 2021-11-05: 0.5 mg via RESPIRATORY_TRACT
  Filled 2021-11-05 (×2): qty 2

## 2021-11-05 MED ORDER — MEXILETINE HCL 150 MG PO CAPS
150.0000 mg | ORAL_CAPSULE | Freq: Two times a day (BID) | ORAL | Status: DC
Start: 2021-11-05 — End: 2021-11-06
  Administered 2021-11-05 – 2021-11-06 (×2): 150 mg via ORAL
  Filled 2021-11-05 (×3): qty 1

## 2021-11-05 MED ORDER — DEXTROSE 50 % IV SOLN
12.5000 g | Freq: Once | INTRAVENOUS | Status: AC
  Administered 2021-11-05: 12.5 g via INTRAVENOUS

## 2021-11-05 MED ORDER — ONDANSETRON HCL 4 MG/2ML IJ SOLN: 4.0000 mg | Freq: Four times a day (QID) | INTRAMUSCULAR | Status: DC | PRN

## 2021-11-05 MED ORDER — METOPROLOL SUCCINATE ER 50 MG PO TB24
50.0000 mg | ORAL_TABLET | Freq: Every day | ORAL | Status: DC
  Administered 2021-11-06: 50 mg via ORAL
  Filled 2021-11-05: qty 1

## 2021-11-05 MED ORDER — INSULIN ASPART 100 UNIT/ML IJ SOLN
0.0000 [IU] | Freq: Three times a day (TID) | INTRAMUSCULAR | Status: DC
  Administered 2021-11-06 (×2): 1 [IU] via SUBCUTANEOUS

## 2021-11-05 MED ORDER — CLOPIDOGREL BISULFATE 75 MG PO TABS
75.0000 mg | ORAL_TABLET | Freq: Every day | ORAL | Status: DC
  Administered 2021-11-06: 75 mg via ORAL
  Filled 2021-11-05: qty 1

## 2021-11-05 MED ORDER — SACUBITRIL-VALSARTAN 24-26 MG PO TABS
1.0000 | ORAL_TABLET | Freq: Two times a day (BID) | ORAL | Status: DC
  Administered 2021-11-05 – 2021-11-06 (×2): 1 via ORAL
  Filled 2021-11-05 (×2): qty 1

## 2021-11-05 MED ORDER — SODIUM CHLORIDE 0.9% FLUSH
3.0000 mL | Freq: Two times a day (BID) | INTRAVENOUS | Status: DC
  Administered 2021-11-05 – 2021-11-06 (×3): 3 mL via INTRAVENOUS

## 2021-11-05 MED ORDER — BISACODYL 5 MG PO TBEC: 5.0000 mg | DELAYED_RELEASE_TABLET | Freq: Every day | ORAL | Status: DC | PRN

## 2021-11-05 MED ORDER — ROSUVASTATIN CALCIUM 5 MG PO TABS
40.0000 mg | ORAL_TABLET | Freq: Every morning | ORAL | Status: DC
  Administered 2021-11-06: 40 mg via ORAL
  Filled 2021-11-05: qty 8

## 2021-11-05 MED ORDER — ONDANSETRON HCL 4 MG PO TABS: 4.0000 mg | ORAL_TABLET | Freq: Four times a day (QID) | ORAL | Status: DC | PRN

## 2021-11-05 MED ORDER — DEXTROSE 50 % IV SOLN
12.5000 g | Freq: Once | INTRAVENOUS | Status: AC
  Administered 2021-11-05: 12.5 g via INTRAVENOUS
  Filled 2021-11-05: qty 50

## 2021-11-05 MED ORDER — AMIODARONE HCL 200 MG PO TABS
400.0000 mg | ORAL_TABLET | Freq: Two times a day (BID) | ORAL | Status: DC
  Administered 2021-11-05 – 2021-11-06 (×2): 400 mg via ORAL
  Filled 2021-11-05 (×2): qty 2

## 2021-11-05 NOTE — ED Triage Notes (Addendum)
BIB [facility] after pt's family member called to report pt having ongoing chest discomfort. Per EMS, pt found to be hypoglycemic on scene and given 12.5 D50 and hypoxic on scene in the 90's. pt placed on 4 L [place]. Pt hypotensive in route with systolic in low 16'R.    HX: HTN, DM, RUE deficit from previous stroke

## 2021-11-05 NOTE — Consult Note (Signed)
Consultation Note Date: 11/05/2021   Patient Name: John [name]  DOB: [birth year]  MRN: [id number]  Age / Sex: 71 y.o., male  PCP: [name], MD Referring Physician: Karmen Bongo, MD  Reason for Consultation: Establishing goals of care  HPI/Patient Profile: 71 y.o. male  with past medical history of afib on Eliquis; CAD s/p stents; DM; HTN; HLD; CVA; lung cancer; and OSA on CPAP admitted on 11/05/2021 with chest pain.   Patient may have taken extra insulin, now denying chest pain. Family is considering transition to comfort care. PMT has been consulted to assist with goals of care conversation.  Clinical Assessment and Goals of Care:  I have reviewed medical records including EPIC notes, labs and imaging, assessed the patient and then met he bedside with patient's wife John [name] to discuss diagnosis prognosis, GOC, EOL wishes, disposition and options.  I introduced Palliative Medicine as specialized medical care for people living with serious illness. It focuses on providing relief from the symptoms and stress of a serious illness. The goal is to improve quality of life for both the patient and the family.  We discussed a brief life review of the patient and then focused on their current illness.   I attempted to elicit values and goals of care important to the patient.    Medical History Review and Understanding:  Reviewed patient's overall illness and deterioration since stroke and heart attack in September of last year.  Social History: Patient and his wife are originally from [place] moved to [place] shortly after getting married.  John [name] has 2 children from previous marriage while John [name] has 1 child from a previous marriage.  He is not enjoying very many activities, sleeps most of the day.  Functional and Nutritional State: Patient tells me the last time he worked with physical therapy was  about 11 months ago as home health PT was never arranged.  Enjoyed working therapies and CIR and would be open to additional therapy to improve his functionality.  He uses a walker for his balance issues and now has new dizziness recently.  Today he is so weak that he could not get up if he tried. John [name]'s daughter assists him with groceries.  Palliative Symptoms: Fatigue, shortness of breath  Advance Directives: A detailed discussion regarding advanced directives was had.  Patient wants to name his wife John [name] as [place].   Code Status: Patient confirms his desire for DNR order.   A MOST form was introduced and an extensive conversation was had, covering concepts specific to code status, artifical feeding and hydration, continued IV antibiotics and rehospitalization.    Discussion: Knute shares that he had 6 good years after his retirement in 2016, with overall worsening quality of life and health since 2022.  He understands he is slowly declining.  He hates taking so many medications but clearly wants to continue life-prolonging interventions and does not want to die.  He clarifies that he would accept his mortality and desires a peaceful death if interventions were no longer able to sustain him or if his quality of life worsened.  His current quality of life is tolerable.  He does not understand why physical therapy was not continued after CIR as he feels this would improve his functioning and quality of life.  He notes that advanced directives and living will has been mentioned several times during his multiple admissions and he would be interested in completing during this admission if possible.  Discussed the importance of continued conversation  with family and the medical providers regarding overall plan of care and treatment options, ensuring decisions are within the context of the patient's values and GOCs.   Questions and concerns were addressed.  Hard Choices booklet left for review. The  family was encouraged to call with questions or concerns.  PMT will continue to support holistically.    SUMMARY OF RECOMMENDATIONS   -DNR confirmed  -Full scope treatment otherwise -Goal is to regain mobility and be able to enjoy more time in his yard, would like [facility] as there is difficulty arranging transportation -MOST form was completed today; patient was provided with a copy and original placed on hard chart.  Will scan copy into [employer] -Psychosocial and emotional support provided -PMT will continue to follow   Prognosis:  Unable to determine  Discharge Planning: Home with Home Health      Primary Diagnoses: Present on Admission:  Hypoglycemia   Review of Systems  Neurological:  Positive for dizziness and weakness.  All other systems reviewed and are negative.  Physical Exam Vitals and nursing note reviewed.  Constitutional:      General: He is not in acute distress. Cardiovascular:     Rate and Rhythm: Normal rate.  Pulmonary:     Effort: Pulmonary effort is normal. No respiratory distress.  Skin:    General: Skin is warm and dry.  Neurological:     Mental Status: He is alert and oriented to person, place, and time.  Psychiatric:        Mood and Affect: Mood normal.    Vital Signs: BP (!) 111/97   Pulse 70   Temp (!) 97.5 F (36.4 C) (Oral)   Resp 13   Ht 6' (1.829 m)   Wt 87.4 kg   SpO2 100%   BMI 26.12 kg/m  Pain Scale: 0-10   Pain Score: 0-No pain  SpO2: SpO2: 100 % O2 Device:SpO2: 100 % O2 Flow Rate: .   Palliative Assessment/Data: 60%     MDM: High   Silvanna Ohmer Gregary Signs Palliative Medicine Team Team phone # [phone]  Thank you for allowing the Palliative Medicine Team to assist in the care of this patient. Please utilize secure chat with additional questions, if there is no response within 30 minutes please call the above phone number.  Palliative Medicine Team providers are available by phone from 7am to 7pm daily and can  be reached through the team cell phone.  Should this patient require assistance outside of these hours, please call the patient's attending physician.

## 2021-11-05 NOTE — ED Notes (Signed)
Pt transported to CT ?

## 2021-11-05 NOTE — ED Provider Notes (Signed)
[facility] Provider Note   CSN: [id number] Arrival date & time: 11/05/21  0451     History  Chief Complaint  Patient presents with   Chest Pain    John CLENNEY is a 71 y.o. male.  71 year old male with history of insulin-dependent diabetes, stroke and significant cardiac disease that presents to the ER today secondary to slurred speech.  Patient apparently had been feeling slightly unwell so talking to his wife he started having slurred speech and feeling funny in his chest and funny in his head.  He has a history of having had many stents and heart attacks he states the chest discomfort was nothing like any of his heart attacks.  He states that when he was trying to speak he knew what he wanted to say and just he was slurring his words and could not finish the whole word sometimes and just made no sense.  After his wife arrived later she corroborates this.  Patient's wife called EMS.  On their arrival he had hypoglycemia his blood sugar was 50.  He was given some dextrose and this improved as did his symptoms.  They did an EKG which they were concerned for STEMI but that was called off by cardiology.  They noted that he was hypoxic 88% on room air/started him on 2 L nasal cannula.  Had some soft pressures but patient states that his blood pressures run around 90/60 all the time and he has stopped Multi-Med occasions recently to try to help it. At this time, patient has no complaints.  He does state that his blood sugars were above 300 at bedtime so he took a couple extra units of insulin.  He usually takes 43 and he took 67.   Chest Pain     Home Medications Prior to Admission medications   Medication Sig Start Date End Date Taking? Authorizing Provider  acetaminophen (TYLENOL) 500 MG tablet Take 500 mg by mouth every 6 (six) hours as needed (pain).   Yes [provider]  albuterol (VENTOLIN HFA) 108 (90 Base) MCG/ACT inhaler Inhale 2 puffs into the lungs  every 6 (six) hours as needed for wheezing or shortness of breath. 06/14/20  Yes Icard, Octavio Graves, DO  amiodarone (PACERONE) 400 MG tablet Take 400 mg by mouth 2 (two) times daily. 11/03/21  Yes [provider]  apixaban (ELIQUIS) 5 MG TABS tablet Take 1 tablet (5 mg total) by mouth 2 (two) times daily. 05/30/21  Yes Baldwin Jamaica, PA-C  clopidogrel (PLAVIX) 75 MG tablet Take 1 tablet (75 mg total) by mouth daily with breakfast. 02/19/21  Yes Duke, Tami Lin, PA  Fluticasone Furoate (ARNUITY ELLIPTA) 200 MCG/ACT AEPB Inhale 1 puff into the lungs daily. Patient taking differently: Inhale 1 puff into the lungs every morning. 12/29/20  Yes Icard, Bradley L, DO  furosemide (LASIX) 40 MG tablet Take 1 tablet (40 mg total) by mouth every other day. 10/25/21  Yes Tobb, Kardie, DO  insulin aspart (NOVOLOG FLEXPEN) 100 UNIT/ML FlexPen INJECT 5-20 UNITS SUBCUTANEOUSLY WITH LUNCH AND DINNER Patient taking differently: Inject 2-4 Units into the skin See admin instructions. Inject 2-4 units subcutaneously midday as needed - CBG 180-200 2 units, >200 4 units. 06/16/20  Yes PickardCammie Mcgee, MD  JARDIANCE 25 MG TABS tablet TAKE 1 TABLET BY MOUTH EVERY DAY Patient taking differently: Take 25 mg by mouth at bedtime. 11/02/21  Yes [name], MD  LEVEMIR FLEXTOUCH 100 UNIT/ML FlexTouch  Pen INJECT 46 UNITS INTO THE SKIN DAILY. DX:E11.9 Patient taking differently: Inject 46 Units into the skin at bedtime. 8pm . Dx:E11.9 09/29/21  Yes [name], MD  metoprolol succinate (TOPROL-XL) 50 MG 24 hr tablet Take 1 tablet (50 mg total) by mouth daily. Take with or immediately following a meal. 06/30/21 11/05/21 Yes Troy Sine, MD  mexiletine (MEXITIL) 150 MG capsule TAKE 2 CAPSULES (300 MG TOTAL) BY MOUTH EVERY 12 (TWELVE) HOURS. Patient taking differently: Take 150 mg by mouth every 12 (twelve) hours. 09/05/21  Yes Camnitz, Will Hassell Done, MD  montelukast (SINGULAIR) 10 MG tablet Take 1 tablet (10 mg  total) by mouth daily. Patient taking differently: Take 10 mg by mouth at bedtime. 08/10/21  Yes [name], MD  nitroGLYCERIN (NITROSTAT) 0.4 MG SL tablet PLACE 1 TABLET (0.4 MG TOTAL) UNDER THE TONGUE EVERY 5 (FIVE) MINUTES AS NEEDED FOR CHEST PAIN. 07/27/20  Yes Troy Sine, MD  rosuvastatin (CRESTOR) 40 MG tablet Take 1 tablet (40 mg total) by mouth daily. Patient taking differently: Take 40 mg by mouth every morning. 06/09/21  Yes Troy Sine, MD  sacubitril-valsartan (ENTRESTO) 24-26 MG Take 1 tablet by mouth 2 (two) times daily. 10/25/21  Yes Tobb, Kardie, DO  glucose blood (ONETOUCH ULTRA) test strip Use as instructed 10/27/21   [name], MD  Insulin Pen Needle (NOVOFINE PEN NEEDLE) 32G X 6 MM MISC 1 each by Does not apply route daily. 08/26/21   [name], MD      Allergies    Omega-3 fatty acids, Benazepril, and Fish allergy    Review of Systems   Review of Systems  Cardiovascular:  Positive for chest pain.   Physical Exam Updated Vital Signs BP (!) 88/58 (BP Location: Left Arm)   Pulse 67   Temp 98 F (36.7 C) (Oral)   Resp 18   Ht 6' (1.829 m)   Wt 86.6 kg   SpO2 100%   BMI 25.89 kg/m  Physical Exam Vitals and nursing note reviewed.  Constitutional:      Appearance: He is well-developed.  HENT:     Head: Normocephalic and atraumatic.  Cardiovascular:     Rate and Rhythm: Normal rate.  Pulmonary:     Effort: Pulmonary effort is normal. No respiratory distress.  Abdominal:     General: There is no distension.  Musculoskeletal:        General: Normal range of motion.     Cervical back: Normal range of motion.  Skin:    General: Skin is warm and dry.  Neurological:     General: No focal deficit present.     Mental Status: He is alert and oriented to person, place, and time.     Cranial Nerves: No cranial nerve deficit.     Motor: No weakness.    ED Results / Procedures / Treatments   Labs (all labs ordered are listed, but only  abnormal results are displayed) Labs Reviewed  CBC WITH DIFFERENTIAL/PLATELET - Abnormal; Notable for the following components:      Result Value   Hemoglobin 11.9 (*)    MCHC 29.2 (*)    RDW 16.7 (*)    Lymphs Abs 0.5 (*)    Abs Immature Granulocytes 0.13 (*)    All other components within normal limits  COMPREHENSIVE METABOLIC PANEL - Abnormal; Notable for the following components:   Glucose, Bld 52 (*)    Calcium 8.6 (*)    Total  Protein 5.6 (*)    Albumin 2.0 (*)    AST 45 (*)    ALT 47 (*)    Alkaline Phosphatase 335 (*)    All other components within normal limits  BRAIN NATRIURETIC PEPTIDE - Abnormal; Notable for the following components:   B Natriuretic Peptide 280.9 (*)    All other components within normal limits  D-DIMER, QUANTITATIVE - Abnormal; Notable for the following components:   D-Dimer, Quant 0.79 (*)    All other components within normal limits  GLUCOSE, CAPILLARY - Abnormal; Notable for the following components:   Glucose-Capillary 175 (*)    All other components within normal limits  CBG MONITORING, ED - Abnormal; Notable for the following components:   Glucose-Capillary 67 (*)    All other components within normal limits  CBG MONITORING, ED - Abnormal; Notable for the following components:   Glucose-Capillary 107 (*)    All other components within normal limits  CBG MONITORING, ED - Abnormal; Notable for the following components:   Glucose-Capillary 106 (*)    All other components within normal limits  CBG MONITORING, ED - Abnormal; Notable for the following components:   Glucose-Capillary 60 (*)    All other components within normal limits  CBG MONITORING, ED - Abnormal; Notable for the following components:   Glucose-Capillary 146 (*)    All other components within normal limits  CBG MONITORING, ED - Abnormal; Notable for the following components:   Glucose-Capillary 126 (*)    All other components within normal limits  CBG MONITORING, ED -  Abnormal; Notable for the following components:   Glucose-Capillary 107 (*)    All other components within normal limits  GLUCOSE, CAPILLARY  BASIC METABOLIC PANEL  CBC  CBG MONITORING, ED  TROPONIN I (HIGH SENSITIVITY)  TROPONIN I (HIGH SENSITIVITY)    EKG None  Radiology CT Head Wo Contrast  Result Date: 11/05/2021 CLINICAL DATA:  71 year old male with history of mental status change. EXAM: CT HEAD WITHOUT CONTRAST TECHNIQUE: Contiguous axial images were obtained from the base of the skull through the vertex without intravenous contrast. RADIATION DOSE REDUCTION: This exam was performed according to the departmental dose-optimization program which includes automated exposure control, adjustment of the mA and/or kV according to patient size and/or use of iterative reconstruction technique. COMPARISON:  Head CT 02/15/2021. FINDINGS: Brain: No evidence of acute infarction, hemorrhage, hydrocephalus, extra-axial collection or mass lesion/mass effect. Vascular: No hyperdense vessel or unexpected calcification. Skull: Normal. Negative for fracture or focal lesion. Sinuses/Orbits: No acute finding. Other: None. IMPRESSION: 1. No acute intracranial abnormalities. The appearance of the brain is normal. Electronically Signed   By: Vinnie Langton M.D.   On: 11/05/2021 06:15   CT Chest W Contrast  Result Date: 11/05/2021 CLINICAL DATA:  Abnormal x-ray. History of non-small cell lung cancer diagnosed in 2021. Status post SBRT completed in January 2023. EXAM: CT CHEST WITH CONTRAST TECHNIQUE: Multidetector CT imaging of the chest was performed during intravenous contrast administration. RADIATION DOSE REDUCTION: This exam was performed according to the departmental dose-optimization program which includes automated exposure control, adjustment of the mA and/or kV according to patient size and/or use of iterative reconstruction technique. CONTRAST:  161mL OMNIPAQUE IOHEXOL 300 MG/ML  SOLN COMPARISON:  CT  scan of the chest August 02, 2021 FINDINGS: Cardiovascular: Mild calcified atherosclerotic changes identified in the thoracic aorta. The ascending thoracic aorta measures 4.5 cm in AP diameter on series 4, image 69 which is unchanged. No dissection. Central pulmonary arteries  are unremarkable. Left-sided coronary artery disease versus stents is again identified. The heart is unchanged. Mediastinum/Nodes: No pleural or pericardial effusions are identified. The chest wall is normal. The thyroid and esophagus are normal. No adenopathy is identified. Lungs/Pleura: The trachea and mainstem bronchi are normal. No pneumothorax. A stable nodule associated with the right minor fissure is considered benign. No new or suspicious nodules, masses, or infiltrates in the right lung. At the site of the patient's primary malignancy in the left upper lobe, the previously identified nodule is no longer appreciated. Instead, there is a pulmonary opacity primarily in the left upper lobe, at the site of the patient's previous malignancy and radiation. There is also a opacity in the anterior aspect of the left lower lobe superior segment. There are air bronchograms within this opacity. This opacity is patchy in appearance. There is a new nodular opacity in the posterior left lower lobe identified on series 5, images 103 through 111. The finding is also well seen on coronal images 124 through 127. This nodular opacity measures 8 x 9 by 25 mm in transverse, AP, and craniocaudal dimensions. Another somewhat nodular density is identified in the medial aspect of the left lower lobe on series 5, image 66 and coronal image 126 measuring 8 x 6 by 12 mm. No other nodules are identified in the left lung. Upper Abdomen: There are new masses within the liver worrisome for metastatic disease. The left and right hepatic lobes are affected. A representative mass in the left hepatic lobe on series 4, image 122 measures 3 cm in greatest dimension. No  other abnormalities are identified in the upper abdomen. Musculoskeletal: Sclerosis is identified in the anterolateral aspects of the left fourth, fifth, 6, seventh, and eighth ribs. These findings are new since February 2023. There are also new healing fractures through the medial clavicles. No evidence of bony metastatic disease identified. IMPRESSION: 1. The masslike opacity in the left upper lobe identified on today's x-ray is new compared to August 02, 2021 and obscures the site of the previously treated malignancy. The overall patchy appearance, lack of masslike findings, and presence of air bronchograms suggests this finding is not likely to represent recurrence. Radiation changes are most likely. Pneumonia could have this appearance but is less likely unless the patient has acute symptoms of pneumonia. Recommend attention on short-term follow-up. 2. 2 new nodular regions in the left lower lobe. These findings could represent metastatic disease, developing infiltrate, or focal atelectasis. Recommend attention on short-term follow-up. 3. New masses in the liver are very concerning for metastatic disease given history and development since February 2023. 4. Healing fractures of the medial clavicles new since February 2023. The sclerosis in multiple adjacent left anterolateral ribs are likely healing fractures as well. 5. Aneurysmal dilatation of the ascending thoracic aorta measures 4.5 cm, stable. 6. Calcified atherosclerotic changes in the thoracic aorta. Coronary artery disease, unchanged. Aortic Atherosclerosis (ICD10-I70.0). Electronically Signed   By: Dorise Bullion III M.D.   On: 11/05/2021 08:20   DG Chest Portable 1 View  Result Date: 11/05/2021 CLINICAL DATA:  71 year old male with history of ongoing chest discomfort. Evaluate for pulmonary edema. EXAM: PORTABLE CHEST 1 VIEW COMPARISON:  Chest x-ray 04/26/2021. FINDINGS: New mass-like opacity in the left mid lung. A fiducial marker is noted in  this region. Mild elevation of the left hemidiaphragm. Right lung is clear. No pleural effusions. No pneumothorax. No evidence of pulmonary edema. Mild cardiomegaly. Upper mediastinal contours are within normal limits. Atherosclerotic calcifications  are noted in the thoracic aorta. Left-sided pacemaker/AICD with lead tips projecting over the expected location of the right atrium and right ventricle. IMPRESSION: 1. Interval development of a mass-like area in the left mid lung adjacent to a fiducial marker. This could represent recurrent disease in this patient with history of treated lung cancer, however, this may alternatively reflect evolving postradiation changes given completion of SBRT in January 2023 with lack of interval follow-up since that time. Clinical correlation is recommended. Repeat contrast enhanced chest CT could also be considered. 2. Aortic atherosclerosis. Electronically Signed   By: Vinnie Langton M.D.   On: 11/05/2021 07:11    Procedures .Critical Care Performed by: Merrily Pew, MD Authorized by: Merrily Pew, MD   Critical care provider statement:    Critical care time (minutes):  30   Critical care was time spent personally by me on the following activities:  Development of treatment plan with patient or surrogate, discussions with consultants, evaluation of patient's response to treatment, examination of patient, ordering and review of laboratory studies, ordering and review of radiographic studies, ordering and performing treatments and interventions, pulse oximetry, re-evaluation of patient's condition and review of old charts    Medications Ordered in ED Medications  dextrose 10 % infusion ( Intravenous New Bag/Given 11/05/21 0933)  sodium chloride flush (NS) 0.9 % injection 3 mL (3 mLs Intravenous Given 11/05/21 2232)  acetaminophen (TYLENOL) tablet 650 mg (has no administration in time range)    Or  acetaminophen (TYLENOL) suppository 650 mg (has no administration in  time range)  polyethylene glycol (MIRALAX / GLYCOLAX) packet 17 g (has no administration in time range)  bisacodyl (DULCOLAX) EC tablet 5 mg (has no administration in time range)  ondansetron (ZOFRAN) tablet 4 mg (has no administration in time range)    Or  ondansetron (ZOFRAN) injection 4 mg (has no administration in time range)  hydrALAZINE (APRESOLINE) injection 5 mg (has no administration in time range)  insulin aspart (novoLOG) injection 0-6 Units (0 Units Subcutaneous Not Given 11/05/21 1639)  albuterol (PROVENTIL) (2.5 MG/3ML) 0.083% nebulizer solution 2.5 mg (has no administration in time range)  amiodarone (PACERONE) tablet 400 mg (400 mg Oral Given 11/05/21 2231)  apixaban (ELIQUIS) tablet 5 mg (5 mg Oral Given 11/05/21 2231)  clopidogrel (PLAVIX) tablet 75 mg (has no administration in time range)  budesonide (PULMICORT) nebulizer solution 0.5 mg (0.5 mg Nebulization Given 11/05/21 2002)  metoprolol succinate (TOPROL-XL) 24 hr tablet 50 mg (has no administration in time range)  mexiletine (MEXITIL) capsule 150 mg (150 mg Oral Given 11/05/21 2232)  montelukast (SINGULAIR) tablet 10 mg (10 mg Oral Given 11/05/21 2231)  rosuvastatin (CRESTOR) tablet 40 mg (has no administration in time range)  sacubitril-valsartan (ENTRESTO) 24-26 mg per tablet (1 tablet Oral Given 11/05/21 2231)  dextrose 50 % solution 50 mL (50 mLs Intravenous Given 11/05/21 0517)  lactated ringers bolus 500 mL (0 mLs Intravenous Stopped 11/05/21 0928)  dextrose 5 % solution (0 mLs Intravenous Stopped 11/05/21 0928)  iohexol (OMNIPAQUE) 300 MG/ML solution 100 mL (100 mLs Intravenous Contrast Given 11/05/21 0742)  dextrose 50 % solution 12.5 g (12.5 g Intravenous Given 11/05/21 0834)  dextrose 50 % solution 12.5 g (12.5 g Intravenous Given 11/05/21 0850)    ED Course/ Medical Decision Making/ A&P                           Medical Decision Making Amount and/or Complexity of Data Reviewed Labs:  ordered. Radiology:  ordered.  Risk Prescription drug management. Decision regarding hospitalization.   Patient had seen improvement upon arrival here but his blood sugars continue to go low.  Required a couple more D50's and ultimately was put on a D5 infusion. CT head negative viewed interpreted by myself for stroke or bleed.  And although MRI is more sensitive I feel like this is a good rule out tested to have low suspicion for acute stroke at this time.  Chest x-ray with concern for new lesion versus radiation effect in left lung so CT scan ordered.  Discussed with hospitalist for admission.     Final Clinical Impression(s) / ED Diagnoses Final diagnoses:  Hypoglycemia    Rx / DC Orders ED Discharge Orders     None         Edenilson Austad, Corene Cornea, MD 11/05/21 2259

## 2021-11-05 NOTE — ED Notes (Signed)
MD Mesner made aware of BP

## 2021-11-05 NOTE — Progress Notes (Signed)
Pt says he feels like he does not need the CPAP tonight but will call if he wants it.

## 2021-11-05 NOTE — ED Notes (Signed)
Pt has LUE skin tear on elbow. Wound cleaned and dressed. Pt A&O x 4.

## 2021-11-05 NOTE — Progress Notes (Signed)
Inpatient Diabetes Program Recommendations  AACE/ADA: New Consensus Statement on Inpatient Glycemic Control (2015)  Target Ranges:  Prepandial:   less than 140 mg/dL      Peak postprandial:   less than 180 mg/dL (1-2 hours)      Critically ill patients:  140 - 180 mg/dL   Lab Results  Component Value Date   GLUCAP 107 (H) 11/05/2021   HGBA1C 6.9 (H) 04/18/2021     Latest Reference Range & Units 11/05/21 06:33 11/05/21 07:16 11/05/21 08:22 11/05/21 09:30 11/05/21 10:59 11/05/21 12:16  Glucose-Capillary 70 - 99 mg/dL 106 (H) 73 60 (L) 146 (H) 126 (H) 107 (H)   Review of Glycemic Control  Diabetes history: DM 2 Outpatient Diabetes medications: Novolog 6-8 units Daily prn for hyperglycemia, Jardiance 25 mg Daily, Levemir 46 units Daily Current orders for Inpatient glycemic control:  Novolog 0-6 units tid  Pt sees PCP at [facility], Last visit on 5/5. It was reported he had lost weight recently which can contribute to hypoglycemia with insulin dose.  Last A1c recorded and obtained was 6.9% on 04/18/21, very tightly controlled  It would be permissible to possibly have a higher target A1c to avoid hypoglycemia and complications related to such in the future.  Inpatient Diabetes Program Recommendations:    Spoke with pt over the phone regarding Glucose control at home in the setting of pt presenting to the hospital with hypoglycemia.  Pt reports seeing his PCP frequently and checks his glucose as prescribed. Pt reports not missing any insulin doses. When he checks his glucose his numbers in the morning are 110-120 range. He has only had one hypoglycemia episode 3 weeks ago when his glucose came down to 45. Pt reports if glucose is below 200 he would take Levemir 46 units. If glucose is >220 then he would take Levemir 48 units.  Pt has been wanting to possibly go down on his insulin dose outpatient but has not had that conversation yet with his PCP. Encouraged follow up  with PCP and having conversation regarding basal insulin dose.   I agreed with the pt to lower his home basal dose. Goal A1c is 7% with an average fingerstick of 150 mg/dl all the time. Based on pts fasting glucose levels and A1c, I would recommend lowering Levemir dose to maybe 40 units to start.    Thanks,  Tama Headings RN, MSN, BC-ADM Inpatient Diabetes Coordinator Team Pager [phone] (8a-5p)

## 2021-11-05 NOTE — Care Management CC44 (Signed)
Condition Code 44 Documentation Completed  Patient Details  Name: BARRETT GOLDIE MRN: [id number] Date of Birth: 07-05-1950   Condition Code 44 given:    Patient signature on Condition Code 44 notice:    Documentation of 2 MD's agreement:    Code 44 added to claim:       Verdell Carmine, RN 11/05/2021, 10:18 AM

## 2021-11-05 NOTE — ED Notes (Signed)
Pt placed on RA while MD Mesner at bedside. Pt 02 then dropped to upper 80's. 2 L Zavalla placed, pt 02 sat 97.

## 2021-11-05 NOTE — ED Notes (Addendum)
ED TO INPATIENT HANDOFF REPORT  ED Nurse Name and Phone #: Caryl Pina RN [phone]  S Name/Age/Gender John [name] 71 y.o. male Room/Bed: 046C/046C  Code Status   Code Status: DNR  Home/SNF/Other Home Patient oriented to: self, place, time, and situation Is this baseline? Yes   Triage Complete: Triage complete  Chief Complaint Hypoglycemia [E16.2]  Triage Note BIB [facility] after pt's family member called to report pt having ongoing chest discomfort. Per EMS, pt found to be hypoglycemic on scene and given 12.5 D50 and hypoxic on scene in the 90's. pt placed on 4 L [place]. Pt hypotensive in route with systolic in low 44'R.    HX: HTN, DM, RUE deficit from previous stroke   Allergies Allergies  Allergen Reactions   Omega-3 Fatty Acids Hives and Itching   Benazepril Other (See Comments)    hyperkalemia   Fish Allergy Itching    Level of Care/Admitting Diagnosis ED Disposition     ED Disposition  Admit   Condition  --   Comment  Hospital Area: [place] [[id number]]  Level of Care: Telemetry Medical [104]  May place patient in observation at [facility] or [place] if equivalent level of care is available:: No  Covid Evaluation: Asymptomatic - no recent exposure (last 10 days) testing not required  Diagnosis: Hypoglycemia [154008]  Admitting Physician: Karmen Bongo [[id number]]  Attending Physician: Karmen Bongo [[id number]]          B Medical/Surgery History Past Medical History:  Diagnosis Date   Arm DVT (deep venous thromboembolism), acute, left (HCC)    while off eliquis for bronchoscopy 2022   Atrial flutter (HCC)    s/p cardioversion   Coronary artery disease    Diabetes mellitus    GERD (gastroesophageal reflux disease)    History of nuclear stress test 04/04/2011   lexiscan; mod-large in size fixed inferolateral defect (scar); non-diagnostic for ischemia; low risk scan    Hyperlipidemia    Hypertension    Ischemic cardiomyopathy     Left foot drop    r/t past disk srugery - uses Kevlar brace   Lung cancer ([place]) 04/26/2021   Myocardial infarction [facility])    posterior MI   Osteomyelitis ([place])    s/p left 2nd toe amputation in 01/2021   Pulmonary nodule    Recurrent ventricular tachycardia (HCC)    Shortness of breath    Sleep apnea    on CPAP; 04/28/2007 split-night - AHI during total sleep 44.43/hr and REM 72.56/hr   Past Surgical History:  Procedure Laterality Date   AMPUTATION TOE Left 02/10/2021   Procedure: AMPUTATION  LEFT SECOND TOE;  Surgeon: Edrick Kins, DPM;  Location: [place];  Service: Podiatry;  Laterality: Left;   [place]   BRONCHIAL BIOPSY  04/26/2021   Procedure: BRONCHIAL BIOPSIES;  Surgeon: Garner Nash, DO;  Location: [place] [facility];  Service: Pulmonary;;   BRONCHIAL BRUSHINGS  04/26/2021   Procedure: BRONCHIAL BRUSHINGS;  Surgeon: Garner Nash, DO;  Location: [place] [facility];  Service: Pulmonary;;   BRONCHIAL NEEDLE ASPIRATION BIOPSY  04/26/2021   Procedure: BRONCHIAL NEEDLE ASPIRATION BIOPSIES;  Surgeon: Garner Nash, DO;  Location: [place];  Service: Pulmonary;;   CARDIAC CATHETERIZATION  2010   6 stents total   CARDIAC CATHETERIZATION  01/2000   percutaneous transluminal coronary balloon angioplasty of mid RCA stenotic lesion   CARDIAC CATHETERIZATION  06/2006   no stenting; ischemic cardiomyopathy, EF 40-45%   CARDIOVERSION N/A  07/28/2016   Procedure: CARDIOVERSION;  Surgeon: Troy Sine, MD;  Location: [place];  Service: Cardiovascular;  Laterality: N/A;   CORONARY ANGIOPLASTY  09/1998   mid-distal RCA balloon dilatation, 4.5 & 5.0 stents    CORONARY ANGIOPLASTY WITH STENT PLACEMENT  03/1994   angioplasty & stenting (non-DES) of circumflex/prox ramus intermedius   CORONARY ANGIOPLASTY WITH STENT PLACEMENT  10/1994   large iliac [id number] stent to RCA   [facility]  12/2002   4.45mm stents x2 of RCA   CORONARY ANGIOPLASTY WITH STENT  PLACEMENT  01/2005   cutting balloon arthrectomy of distal RCA & Cypher DES 3.5x13; cutting balloon arthrectomy of mid RCA with Cypher DES 3.5x18   CORONARY ANGIOPLASTY WITH STENT PLACEMENT  11/2008   stenting of mid RCA with 4.0x26mm driver, non-DES   CORONARY BALLOON ANGIOPLASTY N/A 02/15/2021   Procedure: CORONARY BALLOON ANGIOPLASTY;  Surgeon: Troy Sine, MD;  Location: [place] [facility];  Service: Cardiovascular;  Laterality: N/A;   FIDUCIAL MARKER PLACEMENT  04/26/2021   Procedure: FIDUCIAL MARKER PLACEMENT;  Surgeon: Garner Nash, DO;  Location: [place] [facility];  Service: Pulmonary;;   ICD IMPLANT N/A 08/20/2020   Procedure: ICD IMPLANT;  Surgeon: Constance Haw, MD;  Location: [place] [facility];  Service: Cardiovascular;  Laterality: N/A;   INTRAVASCULAR PRESSURE WIRE/FFR STUDY N/A 03/02/2020   Procedure: INTRAVASCULAR PRESSURE WIRE/FFR STUDY;  Surgeon: Leonie Man, MD;  Location: [place] [facility];  Service: Cardiovascular;  Laterality: N/A;   LEFT HEART CATH AND CORONARY ANGIOGRAPHY N/A 03/02/2020   Procedure: LEFT HEART CATH AND CORONARY ANGIOGRAPHY;  Surgeon: Leonie Man, MD;  Location: [place] [facility];  Service: Cardiovascular;  Laterality: N/A;   LEFT HEART CATH AND CORONARY ANGIOGRAPHY N/A 08/19/2020   Procedure: LEFT HEART CATH AND CORONARY ANGIOGRAPHY;  Surgeon: Lorretta Harp, MD;  Location: [place] [facility];  Service: Cardiovascular;  Laterality: N/A;   LEFT HEART CATH AND CORONARY ANGIOGRAPHY N/A 02/15/2021   Procedure: LEFT HEART CATH AND CORONARY ANGIOGRAPHY;  Surgeon: Troy Sine, MD;  Location: [place] [facility];  Service: Cardiovascular;  Laterality: N/A;   LEFT HEART CATHETERIZATION WITH CORONARY ANGIOGRAM N/A 02/27/2012   Procedure: LEFT HEART CATHETERIZATION WITH CORONARY ANGIOGRAM;  Surgeon: Lorretta Harp, MD;  Location: [facility];  Service: Cardiovascular;  Laterality: N/A;   TRANSTHORACIC ECHOCARDIOGRAM  07/29/2010   EF 50=55%, mod inf  wall hypokinesis & mild post wall hypokinesis; LA mild-mod dilated; mild mitral annular calcif & mild MR; mild TR & elevated RV systolic pressure; AV mildly sclerotic; mild aortic root dilatation    V TACH ABLATION N/A 01/20/2021   Procedure: V TACH ABLATION;  Surgeon: Vickie Epley, MD;  Location: [place] [facility];  Service: Cardiovascular;  Laterality: N/A;   VIDEO BRONCHOSCOPY WITH ENDOBRONCHIAL NAVIGATION Left 04/26/2021   Procedure: VIDEO BRONCHOSCOPY WITH ENDOBRONCHIAL NAVIGATION;  Surgeon: Garner Nash, DO;  Location: [place];  Service: Pulmonary;  Laterality: Left;  ION w/ fiducial   VIDEO BRONCHOSCOPY WITH RADIAL ENDOBRONCHIAL ULTRASOUND  04/26/2021   Procedure: RADIAL ENDOBRONCHIAL ULTRASOUND;  Surgeon: Garner Nash, DO;  Location: [place] [facility];  Service: Pulmonary;;     A IV Location/Drains/Wounds Patient Lines/Drains/Airways Status     Active Line/Drains/Airways     Name Placement date Placement time Site Days   Peripheral IV 11/05/21 20 G Anterior;Distal;Right Forearm 11/05/21  0502  Forearm  less than 1   Peripheral IV 11/05/21 20  G Anterior;Left Forearm 11/05/21  --  Forearm  less than 1   Incision (Closed) 02/10/21 Foot Left 02/10/21  1751  -- 268   Wound / Incision (Open or Dehisced) 04/27/21 Skin tear Arm Right;Lower red, minimal bloody drainage 04/27/21  0016  Arm  192            Intake/Output Last 24 hours  Intake/Output Summary (Last 24 hours) at 11/05/2021 1315 Last data filed at 11/05/2021 3086 Gross per 24 hour  Intake 703.79 ml  Output --  Net 703.79 ml    Labs/Imaging Results for orders placed or performed during the hospital encounter of 11/05/21 (from the past 48 hour(s))  CBG monitoring, ED     Status: Abnormal   Collection Time: 11/05/21  4:57 AM  Result Value Ref Range   Glucose-Capillary 67 (L) 70 - 99 mg/dL    Comment: Glucose reference range applies only to samples taken after fasting for at least 8 hours.  CBC with  Differential     Status: Abnormal   Collection Time: 11/05/21  5:07 AM  Result Value Ref Range   WBC 7.3 4.0 - 10.5 K/uL   RBC 4.58 4.22 - 5.81 MIL/uL   Hemoglobin 11.9 (L) 13.0 - 17.0 g/dL   HCT 40.8 39.0 - 52.0 %   MCV 89.1 80.0 - 100.0 fL   MCH 26.0 26.0 - 34.0 pg   MCHC 29.2 (L) 30.0 - 36.0 g/dL   RDW 16.7 (H) 11.5 - 15.5 %   Platelets 324 150 - 400 K/uL   nRBC 0.0 0.0 - 0.2 %   Neutrophils Relative % 81 %   Neutro Abs 6.0 1.7 - 7.7 K/uL   Lymphocytes Relative 7 %   Lymphs Abs 0.5 (L) 0.7 - 4.0 K/uL   Monocytes Relative 8 %   Monocytes Absolute 0.6 0.1 - 1.0 K/uL   Eosinophils Relative 1 %   Eosinophils Absolute 0.1 0.0 - 0.5 K/uL   Basophils Relative 1 %   Basophils Absolute 0.0 0.0 - 0.1 K/uL   Immature Granulocytes 2 %   Abs Immature Granulocytes 0.13 (H) 0.00 - 0.07 K/uL    Comment: Performed at [facility], [address] [place], [place] [address]  Comprehensive metabolic panel     Status: Abnormal   Collection Time: 11/05/21  5:07 AM  Result Value Ref Range   Sodium 141 135 - 145 mmol/L   Potassium 3.5 3.5 - 5.1 mmol/L   Chloride 109 98 - 111 mmol/L   CO2 24 22 - 32 mmol/L   Glucose, Bld 52 (L) 70 - 99 mg/dL    Comment: Glucose reference range applies only to samples taken after fasting for at least 8 hours.   BUN 15 8 - 23 mg/dL   Creatinine, Ser 1.12 0.61 - 1.24 mg/dL   Calcium 8.6 (L) 8.9 - 10.3 mg/dL   Total Protein 5.6 (L) 6.5 - 8.1 g/dL   Albumin 2.0 (L) 3.5 - 5.0 g/dL   AST 45 (H) 15 - 41 U/L   ALT 47 (H) 0 - 44 U/L   Alkaline Phosphatase 335 (H) 38 - 126 U/L   Total Bilirubin 0.4 0.3 - 1.2 mg/dL   GFR, Estimated >60 >60 mL/min    Comment: (NOTE) Calculated using the CKD-EPI Creatinine Equation (2021)    Anion gap 8 5 - 15    Comment: Performed at [facility], [place] [address] [place], [place] [address]  Troponin I (High Sensitivity)  Status: None   Collection Time: 11/05/21  5:07 AM  Result Value Ref Range   Troponin I (High  Sensitivity) 12 <18 ng/L    Comment: (NOTE) Elevated high sensitivity troponin I (hsTnI) values and significant  changes across serial measurements may suggest ACS but many other  chronic and acute conditions are known to elevate hsTnI results.  Refer to the "Links" section for chest pain algorithms and additional  guidance. Performed at [facility], [place] [address], [place], [place] [address]   Brain natriuretic peptide     Status: Abnormal   Collection Time: 11/05/21  5:07 AM  Result Value Ref Range   B Natriuretic Peptide 280.9 (H) 0.0 - 100.0 pg/mL    Comment: Performed at [facility] [address], [place], [place] [address]  CBG monitoring, ED     Status: Abnormal   Collection Time: 11/05/21  5:55 AM  Result Value Ref Range   Glucose-Capillary 107 (H) 70 - 99 mg/dL    Comment: Glucose reference range applies only to samples taken after fasting for at least 8 hours.  CBG monitoring, ED     Status: Abnormal   Collection Time: 11/05/21  6:33 AM  Result Value Ref Range   Glucose-Capillary 106 (H) 70 - 99 mg/dL    Comment: Glucose reference range applies only to samples taken after fasting for at least 8 hours.  CBG monitoring, ED     Status: None   Collection Time: 11/05/21  7:16 AM  Result Value Ref Range   Glucose-Capillary 73 70 - 99 mg/dL    Comment: Glucose reference range applies only to samples taken after fasting for at least 8 hours.  D-dimer, quantitative     Status: Abnormal   Collection Time: 11/05/21  7:21 AM  Result Value Ref Range   D-Dimer, Quant 0.79 (H) 0.00 - 0.50 ug/mL-FEU    Comment: (NOTE) At the manufacturer cut-off value of 0.5 g/mL FEU, this assay has a negative predictive value of 95-100%.This assay is intended for use in conjunction with a clinical pretest probability (PTP) assessment model to exclude pulmonary embolism (PE) and deep venous thrombosis (DVT) in outpatients suspected of PE or DVT. Results should be correlated with  clinical presentation. Performed at [facility], [place] [address] [place], [place] [address]   Troponin I (High Sensitivity)     Status: None   Collection Time: 11/05/21  7:21 AM  Result Value Ref Range   Troponin I (High Sensitivity) 11 <18 ng/L    Comment: (NOTE) Elevated high sensitivity troponin I (hsTnI) values and significant  changes across serial measurements may suggest ACS but many other  chronic and acute conditions are known to elevate hsTnI results.  Refer to the "Links" section for chest pain algorithms and additional  guidance. Performed at [facility], [place] [address], [place], [place] [address]   CBG monitoring, ED     Status: Abnormal   Collection Time: 11/05/21  8:22 AM  Result Value Ref Range   Glucose-Capillary 60 (L) 70 - 99 mg/dL    Comment: Glucose reference range applies only to samples taken after fasting for at least 8 hours.  CBG monitoring, ED     Status: Abnormal   Collection Time: 11/05/21  9:30 AM  Result Value Ref Range   Glucose-Capillary 146 (H) 70 - 99 mg/dL    Comment: Glucose reference range applies only to samples taken after fasting for at least 8 hours.  CBG monitoring, ED     Status: Abnormal   Collection Time: 11/05/21 10:59 AM  Result Value Ref Range   Glucose-Capillary 126 (H) 70 - 99 mg/dL    Comment: Glucose reference range applies only to samples taken after fasting for at least 8 hours.  CBG monitoring, ED     Status: Abnormal   Collection Time: 11/05/21 12:16 PM  Result Value Ref Range   Glucose-Capillary 107 (H) 70 - 99 mg/dL    Comment: Glucose reference range applies only to samples taken after fasting for at least 8 hours.   CT Head Wo Contrast  Result Date: 11/05/2021 CLINICAL DATA:  71 year old male with history of mental status change. EXAM: CT HEAD WITHOUT CONTRAST TECHNIQUE: Contiguous axial images were obtained from the base of the skull through the vertex without intravenous contrast. RADIATION DOSE  REDUCTION: This exam was performed according to the departmental dose-optimization program which includes automated exposure control, adjustment of the mA and/or kV according to patient size and/or use of iterative reconstruction technique. COMPARISON:  Head CT 02/15/2021. FINDINGS: Brain: No evidence of acute infarction, hemorrhage, hydrocephalus, extra-axial collection or mass lesion/mass effect. Vascular: No hyperdense vessel or unexpected calcification. Skull: Normal. Negative for fracture or focal lesion. Sinuses/Orbits: No acute finding. Other: None. IMPRESSION: 1. No acute intracranial abnormalities. The appearance of the brain is normal. Electronically Signed   By: Vinnie Langton M.D.   On: 11/05/2021 06:15   CT Chest W Contrast  Result Date: 11/05/2021 CLINICAL DATA:  Abnormal x-ray. History of non-small cell lung cancer diagnosed in 2021. Status post SBRT completed in January 2023. EXAM: CT CHEST WITH CONTRAST TECHNIQUE: Multidetector CT imaging of the chest was performed during intravenous contrast administration. RADIATION DOSE REDUCTION: This exam was performed according to the departmental dose-optimization program which includes automated exposure control, adjustment of the mA and/or kV according to patient size and/or use of iterative reconstruction technique. CONTRAST:  169mL OMNIPAQUE IOHEXOL 300 MG/ML  SOLN COMPARISON:  CT scan of the chest August 02, 2021 FINDINGS: Cardiovascular: Mild calcified atherosclerotic changes identified in the thoracic aorta. The ascending thoracic aorta measures 4.5 cm in AP diameter on series 4, image 69 which is unchanged. No dissection. Central pulmonary arteries are unremarkable. Left-sided coronary artery disease versus stents is again identified. The heart is unchanged. Mediastinum/Nodes: No pleural or pericardial effusions are identified. The chest wall is normal. The thyroid and esophagus are normal. No adenopathy is identified. Lungs/Pleura: The  trachea and mainstem bronchi are normal. No pneumothorax. A stable nodule associated with the right minor fissure is considered benign. No new or suspicious nodules, masses, or infiltrates in the right lung. At the site of the patient's primary malignancy in the left upper lobe, the previously identified nodule is no longer appreciated. Instead, there is a pulmonary opacity primarily in the left upper lobe, at the site of the patient's previous malignancy and radiation. There is also a opacity in the anterior aspect of the left lower lobe superior segment. There are air bronchograms within this opacity. This opacity is patchy in appearance. There is a new nodular opacity in the posterior left lower lobe identified on series 5, images 103 through 111. The finding is also well seen on coronal images 124 through 127. This nodular opacity measures 8 x 9 by 25 mm in transverse, AP, and craniocaudal dimensions. Another somewhat nodular density is identified in the medial aspect of the left lower lobe on series 5, image 66 and coronal image 126 measuring  8 x 6 by 12 mm. No other nodules are identified in the left lung. Upper Abdomen: There are new masses within the liver worrisome for metastatic disease. The left and right hepatic lobes are affected. A representative mass in the left hepatic lobe on series 4, image 122 measures 3 cm in greatest dimension. No other abnormalities are identified in the upper abdomen. Musculoskeletal: Sclerosis is identified in the anterolateral aspects of the left fourth, fifth, 6, seventh, and eighth ribs. These findings are new since February 2023. There are also new healing fractures through the medial clavicles. No evidence of bony metastatic disease identified. IMPRESSION: 1. The masslike opacity in the left upper lobe identified on today's x-ray is new compared to August 02, 2021 and obscures the site of the previously treated malignancy. The overall patchy appearance, lack of  masslike findings, and presence of air bronchograms suggests this finding is not likely to represent recurrence. Radiation changes are most likely. Pneumonia could have this appearance but is less likely unless the patient has acute symptoms of pneumonia. Recommend attention on short-term follow-up. 2. 2 new nodular regions in the left lower lobe. These findings could represent metastatic disease, developing infiltrate, or focal atelectasis. Recommend attention on short-term follow-up. 3. New masses in the liver are very concerning for metastatic disease given history and development since February 2023. 4. Healing fractures of the medial clavicles new since February 2023. The sclerosis in multiple adjacent left anterolateral ribs are likely healing fractures as well. 5. Aneurysmal dilatation of the ascending thoracic aorta measures 4.5 cm, stable. 6. Calcified atherosclerotic changes in the thoracic aorta. Coronary artery disease, unchanged. Aortic Atherosclerosis (ICD10-I70.0). Electronically Signed   By: Dorise Bullion III M.D.   On: 11/05/2021 08:20   DG Chest Portable 1 View  Result Date: 11/05/2021 CLINICAL DATA:  71 year old male with history of ongoing chest discomfort. Evaluate for pulmonary edema. EXAM: PORTABLE CHEST 1 VIEW COMPARISON:  Chest x-ray 04/26/2021. FINDINGS: New mass-like opacity in the left mid lung. A fiducial marker is noted in this region. Mild elevation of the left hemidiaphragm. Right lung is clear. No pleural effusions. No pneumothorax. No evidence of pulmonary edema. Mild cardiomegaly. Upper mediastinal contours are within normal limits. Atherosclerotic calcifications are noted in the thoracic aorta. Left-sided pacemaker/AICD with lead tips projecting over the expected location of the right atrium and right ventricle. IMPRESSION: 1. Interval development of a mass-like area in the left mid lung adjacent to a fiducial marker. This could represent recurrent disease in this patient  with history of treated lung cancer, however, this may alternatively reflect evolving postradiation changes given completion of SBRT in January 2023 with lack of interval follow-up since that time. Clinical correlation is recommended. Repeat contrast enhanced chest CT could also be considered. 2. Aortic atherosclerosis. Electronically Signed   By: Vinnie Langton M.D.   On: 11/05/2021 07:11    Pending Labs Unresulted Labs (From admission, onward)     Start     Ordered   11/06/21 1638  Basic metabolic panel  Tomorrow morning,   R        11/05/21 0948   11/06/21 0500  CBC  Tomorrow morning,   R        11/05/21 0948            Vitals/Pain Today's Vitals   11/05/21 1000 11/05/21 1100 11/05/21 1200 11/05/21 1300  BP: 128/77 112/74 (!) 111/97 107/76  Pulse: 69 72 70 70  Resp: 19 14 13 19   Temp:  TempSrc:      SpO2: 100% 100% 100% 100%  Weight:      Height:      PainSc:        Isolation Precautions No active isolations  Medications Medications  dextrose 10 % infusion ( Intravenous New Bag/Given 11/05/21 0933)  sodium chloride flush (NS) 0.9 % injection 3 mL (3 mLs Intravenous Given 11/05/21 0950)  acetaminophen (TYLENOL) tablet 650 mg (has no administration in time range)    Or  acetaminophen (TYLENOL) suppository 650 mg (has no administration in time range)  polyethylene glycol (MIRALAX / GLYCOLAX) packet 17 g (has no administration in time range)  bisacodyl (DULCOLAX) EC tablet 5 mg (has no administration in time range)  ondansetron (ZOFRAN) tablet 4 mg (has no administration in time range)    Or  ondansetron (ZOFRAN) injection 4 mg (has no administration in time range)  hydrALAZINE (APRESOLINE) injection 5 mg (has no administration in time range)  insulin aspart (novoLOG) injection 0-6 Units (0 Units Subcutaneous Not Given 11/05/21 1106)  dextrose 50 % solution 50 mL (50 mLs Intravenous Given 11/05/21 0517)  lactated ringers bolus 500 mL (0 mLs Intravenous Stopped  11/05/21 0928)  dextrose 5 % solution (0 mLs Intravenous Stopped 11/05/21 0928)  iohexol (OMNIPAQUE) 300 MG/ML solution 100 mL (100 mLs Intravenous Contrast Given 11/05/21 0742)  dextrose 50 % solution 12.5 g (12.5 g Intravenous Given 11/05/21 0834)  dextrose 50 % solution 12.5 g (12.5 g Intravenous Given 11/05/21 0850)    Mobility walks with device High fall risk   Focused Assessments Cardiac Assessment Handoff:    Lab Results  Component Value Date   CKTOTAL 104 03/14/2018   CKMB 9.5 (H) 03/14/2018   TROPONINI <0.03 09/12/2017   Lab Results  Component Value Date   DDIMER 0.79 (H) 11/05/2021   Does the Patient currently have chest pain? No    R Recommendations: See Admitting Provider Note  Report given to:   Additional Notes: Pt states that he had a chest discomfort resulting in his visit. Has denied pain since arrival. CBG was 50's with EMS, was given an amp of D50. Has received 2 additional amps since being in ED. Now on D10 to help maintain blood sugar. Hypotension resolved with 500 LR bolus. A&O x4, uses urinal.

## 2021-11-05 NOTE — ED Notes (Signed)
MD Mesner made aware of manual BP

## 2021-11-05 NOTE — H&P (Signed)
History and Physical    Patient: John [name] TDD:[id number] DOB: 03/12/1951 DOA: 11/05/2021 DOS: the patient was seen and examined on 11/05/2021 PCP: [name], MD  Patient coming from: Home - lives with wife; NOK: Wife, Robbi Scurlock, [phone]   Chief Complaint: chest pain  HPI: John [name] is a 71 y.o. male with medical history significant of afib on Eliquis; CAD s/p stents; DM; HTN; HLD; CVA; lung cancer; and OSA on CPAP presenting with chest pain.  He denies chest pain and just reports being very sleepy.  His wife reports that this morning he was not talking well, slurred speech.  He was very thirsty and he was sweating all over.  He thought he was dying.  He takes his insulin before bed, ate ok last night and she doesn't think he changed anything.  She has noticed in the last couple of weeks that he is coughing more, unusual for him.  He sees Dr. Valeta Harms for his lung issues.  They had palliative care support during his last hospitalization and they are open to in-home support.  "He is so tired and weary, I just want him to have some peace."  She is considering transition to comfort only.    ER Course:  Hypoglycemic, persistent.  ?took extra insulin.  Difficulty speaking, weakness.  +chest discomfort, different from prior ACS.  Glucose 50 with EMS, given sugar and better but >100 -> 67 -> 52.  Starting D5 infusion.  Glucose 300 last night, took 2 extra units.  EKG abnormal, cards fellow reviewed, suggests monitoring troponin and call if needed.  CXR with ?new malignancy, finished treatment in January for prior cancer, CT pending.     Review of Systems: As mentioned in the history of present illness. All other systems reviewed and are negative.  Limited by somnolent.  Past Medical History:  Diagnosis Date   Arm DVT (deep venous thromboembolism), acute, left (HCC)    while off eliquis for bronchoscopy 2022   Atrial flutter (HCC)    s/p cardioversion   Coronary artery  disease    Diabetes mellitus    GERD (gastroesophageal reflux disease)    History of nuclear stress test 04/04/2011   lexiscan; mod-large in size fixed inferolateral defect (scar); non-diagnostic for ischemia; low risk scan    Hyperlipidemia    Hypertension    Ischemic cardiomyopathy    Left foot drop    r/t past disk srugery - uses Kevlar brace   Lung cancer ([place]) 04/26/2021   Myocardial infarction [facility])    posterior MI   Osteomyelitis ([place])    s/p left 2nd toe amputation in 01/2021   Pulmonary nodule    Recurrent ventricular tachycardia (HCC)    Shortness of breath    Sleep apnea    on CPAP; 04/28/2007 split-night - AHI during total sleep 44.43/hr and REM 72.56/hr   Past Surgical History:  Procedure Laterality Date   AMPUTATION TOE Left 02/10/2021   Procedure: AMPUTATION  LEFT SECOND TOE;  Surgeon: Edrick Kins, DPM;  Location: [place];  Service: Podiatry;  Laterality: Left;   [place]   BRONCHIAL BIOPSY  04/26/2021   Procedure: BRONCHIAL BIOPSIES;  Surgeon: Garner Nash, DO;  Location: [place] ENDOSCOPY;  Service: Pulmonary;;   BRONCHIAL BRUSHINGS  04/26/2021   Procedure: BRONCHIAL BRUSHINGS;  Surgeon: Garner Nash, DO;  Location: [place] ENDOSCOPY;  Service: Pulmonary;;   BRONCHIAL NEEDLE ASPIRATION BIOPSY  04/26/2021   Procedure: BRONCHIAL NEEDLE ASPIRATION BIOPSIES;  Surgeon: Garner Nash, DO;  Location: [place] [facility];  Service: Pulmonary;;   CARDIAC CATHETERIZATION  2010   6 stents total   CARDIAC CATHETERIZATION  01/2000   percutaneous transluminal coronary balloon angioplasty of mid RCA stenotic lesion   CARDIAC CATHETERIZATION  06/2006   no stenting; ischemic cardiomyopathy, EF 40-45%   CARDIOVERSION N/A 07/28/2016   Procedure: CARDIOVERSION;  Surgeon: Troy Sine, MD;  Location: [place];  Service: Cardiovascular;  Laterality: N/A;   CORONARY ANGIOPLASTY  09/1998   mid-distal RCA balloon dilatation, 4.5 & 5.0 stents    CORONARY ANGIOPLASTY WITH STENT  PLACEMENT  03/1994   angioplasty & stenting (non-DES) of circumflex/prox ramus intermedius   CORONARY ANGIOPLASTY WITH STENT PLACEMENT  10/1994   large iliac [id number] stent to RCA   [place]  12/2002   4.72mm stents x2 of RCA   CORONARY ANGIOPLASTY WITH STENT PLACEMENT  01/2005   cutting balloon arthrectomy of distal RCA & Cypher DES 3.5x13; cutting balloon arthrectomy of mid RCA with Cypher DES 3.5x18   CORONARY ANGIOPLASTY WITH STENT PLACEMENT  11/2008   stenting of mid RCA with 4.0x32mm driver, non-DES   CORONARY BALLOON ANGIOPLASTY N/A 02/15/2021   Procedure: CORONARY BALLOON ANGIOPLASTY;  Surgeon: Troy Sine, MD;  Location: [place] CV [facility];  Service: Cardiovascular;  Laterality: N/A;   FIDUCIAL MARKER PLACEMENT  04/26/2021   Procedure: FIDUCIAL MARKER PLACEMENT;  Surgeon: Garner Nash, DO;  Location: [place] ENDOSCOPY;  Service: Pulmonary;;   ICD IMPLANT N/A 08/20/2020   Procedure: ICD IMPLANT;  Surgeon: Constance Haw, MD;  Location: [place] [facility];  Service: Cardiovascular;  Laterality: N/A;   INTRAVASCULAR PRESSURE WIRE/FFR STUDY N/A 03/02/2020   Procedure: INTRAVASCULAR PRESSURE WIRE/FFR STUDY;  Surgeon: Leonie Man, MD;  Location: [place] [facility];  Service: Cardiovascular;  Laterality: N/A;   LEFT HEART CATH AND CORONARY ANGIOGRAPHY N/A 03/02/2020   Procedure: LEFT HEART CATH AND CORONARY ANGIOGRAPHY;  Surgeon: Leonie Man, MD;  Location: [place] [facility];  Service: Cardiovascular;  Laterality: N/A;   LEFT HEART CATH AND CORONARY ANGIOGRAPHY N/A 08/19/2020   Procedure: LEFT HEART CATH AND CORONARY ANGIOGRAPHY;  Surgeon: Lorretta Harp, MD;  Location: [place] [facility];  Service: Cardiovascular;  Laterality: N/A;   LEFT HEART CATH AND CORONARY ANGIOGRAPHY N/A 02/15/2021   Procedure: LEFT HEART CATH AND CORONARY ANGIOGRAPHY;  Surgeon: Troy Sine, MD;  Location: [place] [facility];  Service: Cardiovascular;  Laterality: N/A;    LEFT HEART CATHETERIZATION WITH CORONARY ANGIOGRAM N/A 02/27/2012   Procedure: LEFT HEART CATHETERIZATION WITH CORONARY ANGIOGRAM;  Surgeon: Lorretta Harp, MD;  Location: [facility];  Service: Cardiovascular;  Laterality: N/A;   TRANSTHORACIC ECHOCARDIOGRAM  07/29/2010   EF 50=55%, mod inf wall hypokinesis & mild post wall hypokinesis; LA mild-mod dilated; mild mitral annular calcif & mild MR; mild TR & elevated RV systolic pressure; AV mildly sclerotic; mild aortic root dilatation    V TACH ABLATION N/A 01/20/2021   Procedure: V TACH ABLATION;  Surgeon: Vickie Epley, MD;  Location: [place] [facility];  Service: Cardiovascular;  Laterality: N/A;   VIDEO BRONCHOSCOPY WITH ENDOBRONCHIAL NAVIGATION Left 04/26/2021   Procedure: VIDEO BRONCHOSCOPY WITH ENDOBRONCHIAL NAVIGATION;  Surgeon: Garner Nash, DO;  Location: [place];  Service: Pulmonary;  Laterality: Left;  ION w/ fiducial   VIDEO BRONCHOSCOPY WITH RADIAL ENDOBRONCHIAL ULTRASOUND  04/26/2021   Procedure: RADIAL ENDOBRONCHIAL ULTRASOUND;  Surgeon: Garner Nash,  DO;  Location: [facility];  Service: Pulmonary;;   Social History:  reports that he quit smoking about 5 years ago. His smoking use included cigarettes. He has a 50.00 pack-year smoking history. He has never used smokeless tobacco. He reports that he does not currently use alcohol. He reports that he does not use drugs.  Allergies  Allergen Reactions   Omega-3 Fatty Acids Hives and Itching   Benazepril Other (See Comments)    hyperkalemia   Fish Allergy Itching    Family History  Problem Relation Age of Onset   Bone cancer Mother    Heart attack Father     Prior to Admission medications   Medication Sig Start Date End Date Taking? Authorizing Provider  albuterol (VENTOLIN HFA) 108 (90 Base) MCG/ACT inhaler Inhale 2 puffs into the lungs every 6 (six) hours as needed for wheezing or shortness of breath. 06/14/20   Garner Nash, DO  amiodarone (PACERONE) 200  MG tablet Take 1 tablet (200 mg total) by mouth daily. 06/14/21   Baldwin Jamaica, PA-C  amoxicillin (AMOXIL) 500 MG capsule Take by mouth. Patient not taking: Reported on 10/21/2021 10/12/21   [provider]  apixaban (ELIQUIS) 5 MG TABS tablet Take 1 tablet (5 mg total) by mouth 2 (two) times daily. 05/30/21   Baldwin Jamaica, PA-C  clopidogrel (PLAVIX) 75 MG tablet Take 1 tablet (75 mg total) by mouth daily with breakfast. 02/19/21   Duke, Tami Lin, PA  Fluticasone Furoate (ARNUITY ELLIPTA) 200 MCG/ACT AEPB Inhale 1 puff into the lungs daily. 12/29/20   Icard, Octavio Graves, DO  furosemide (LASIX) 40 MG tablet Take 1 tablet (40 mg total) by mouth every other day. 10/25/21   Tobb, Kardie, DO  glucose blood (ONETOUCH ULTRA) test strip Use as instructed 10/27/21   [name], MD  insulin aspart (NOVOLOG FLEXPEN) 100 UNIT/ML FlexPen INJECT 5-20 UNITS SUBCUTANEOUSLY WITH LUNCH AND DINNER Patient taking differently: Inject 6-8 Units into the skin daily as needed for high blood sugar. 06/16/20   [name], MD  Insulin Pen Needle (NOVOFINE PEN NEEDLE) 32G X 6 MM MISC 1 each by Does not apply route daily. 08/26/21   [name], MD  JARDIANCE 25 MG TABS tablet TAKE 1 TABLET BY MOUTH EVERY DAY 11/02/21   [name], MD  LEVEMIR FLEXTOUCH 100 UNIT/ML FlexTouch Pen INJECT 46 UNITS INTO THE SKIN DAILY. DX:E11.9 09/29/21   [name], MD  metoprolol succinate (TOPROL-XL) 50 MG 24 hr tablet Take 1 tablet (50 mg total) by mouth daily. Take with or immediately following a meal. 06/30/21 10/25/21  Troy Sine, MD  mexiletine (MEXITIL) 150 MG capsule TAKE 2 CAPSULES (300 MG TOTAL) BY MOUTH EVERY 12 (TWELVE) HOURS. 09/05/21   Camnitz, Ocie Doyne, MD  montelukast (SINGULAIR) 10 MG tablet Take 1 tablet (10 mg total) by mouth daily. 08/10/21   [name], MD  nitroGLYCERIN (NITROSTAT) 0.4 MG SL tablet PLACE 1 TABLET (0.4 MG TOTAL) UNDER THE TONGUE EVERY 5 (FIVE) MINUTES AS  NEEDED FOR CHEST PAIN. 07/27/20   Troy Sine, MD  rosuvastatin (CRESTOR) 40 MG tablet Take 1 tablet (40 mg total) by mouth daily. 06/09/21   Troy Sine, MD  sacubitril-valsartan (ENTRESTO) 24-26 MG Take 1 tablet by mouth 2 (two) times daily. 10/25/21   Berniece Salines, DO    Physical Exam: Vitals:   11/05/21 1300 11/05/21 1330 11/05/21 1345 11/05/21 1359  BP: 107/76 101/71  101/67  Pulse: 70 69    Resp: 19 19  18   Temp:   (!) 97.5 F (36.4 C) 98 F (36.7 C)  TempSrc:   Oral Oral  SpO2: 100% 100%    Weight:    86.6 kg  Height:    6' (1.829 m)   General:  Appears very somnolent and is in NAD Eyes:  PERRL, EOMI, normal lids, iris ENT:  grossly normal hearing, lips & tongue, mmm Neck:  no LAD, masses or thyromegaly Cardiovascular:  RRR, no m/r/g. No LE edema.  Respiratory:   CTA bilaterally with no wheezes/rales/rhonchi.  Normal respiratory effort. Abdomen:  soft, NT, ND Skin:  no rash or induration seen on limited exam Musculoskeletal:  grossly normal tone BUE/BLE, good ROM, no bony abnormality Psychiatric:  flat/somnolent mood and affect, speech sparse but appropriate, AOx3 Neurologic:  CN 2-12 grossly intact, moves all extremities in coordinated fashion   Radiological Exams on Admission: Independently reviewed - see discussion in A/P where applicable  CT Head Wo Contrast  Result Date: 11/05/2021 CLINICAL DATA:  71 year old male with history of mental status change. EXAM: CT HEAD WITHOUT CONTRAST TECHNIQUE: Contiguous axial images were obtained from the base of the skull through the vertex without intravenous contrast. RADIATION DOSE REDUCTION: This exam was performed according to the departmental dose-optimization program which includes automated exposure control, adjustment of the mA and/or kV according to patient size and/or use of iterative reconstruction technique. COMPARISON:  Head CT 02/15/2021. FINDINGS: Brain: No evidence of acute infarction, hemorrhage, hydrocephalus,  extra-axial collection or mass lesion/mass effect. Vascular: No hyperdense vessel or unexpected calcification. Skull: Normal. Negative for fracture or focal lesion. Sinuses/Orbits: No acute finding. Other: None. IMPRESSION: 1. No acute intracranial abnormalities. The appearance of the brain is normal. Electronically Signed   By: Vinnie Langton M.D.   On: 11/05/2021 06:15   CT Chest W Contrast  Result Date: 11/05/2021 CLINICAL DATA:  Abnormal x-ray. History of non-small cell lung cancer diagnosed in 2021. Status post SBRT completed in January 2023. EXAM: CT CHEST WITH CONTRAST TECHNIQUE: Multidetector CT imaging of the chest was performed during intravenous contrast administration. RADIATION DOSE REDUCTION: This exam was performed according to the departmental dose-optimization program which includes automated exposure control, adjustment of the mA and/or kV according to patient size and/or use of iterative reconstruction technique. CONTRAST:  153mL OMNIPAQUE IOHEXOL 300 MG/ML  SOLN COMPARISON:  CT scan of the chest August 02, 2021 FINDINGS: Cardiovascular: Mild calcified atherosclerotic changes identified in the thoracic aorta. The ascending thoracic aorta measures 4.5 cm in AP diameter on series 4, image 69 which is unchanged. No dissection. Central pulmonary arteries are unremarkable. Left-sided coronary artery disease versus stents is again identified. The heart is unchanged. Mediastinum/Nodes: No pleural or pericardial effusions are identified. The chest wall is normal. The thyroid and esophagus are normal. No adenopathy is identified. Lungs/Pleura: The trachea and mainstem bronchi are normal. No pneumothorax. A stable nodule associated with the right minor fissure is considered benign. No new or suspicious nodules, masses, or infiltrates in the right lung. At the site of the patient's primary malignancy in the left upper lobe, the previously identified nodule is no longer appreciated. Instead, there is  a pulmonary opacity primarily in the left upper lobe, at the site of the patient's previous malignancy and radiation. There is also a opacity in the anterior aspect of the left lower lobe superior segment. There are air bronchograms within this opacity. This opacity is patchy in appearance. There is a  new nodular opacity in the posterior left lower lobe identified on series 5, images 103 through 111. The finding is also well seen on coronal images 124 through 127. This nodular opacity measures 8 x 9 by 25 mm in transverse, AP, and craniocaudal dimensions. Another somewhat nodular density is identified in the medial aspect of the left lower lobe on series 5, image 66 and coronal image 126 measuring 8 x 6 by 12 mm. No other nodules are identified in the left lung. Upper Abdomen: There are new masses within the liver worrisome for metastatic disease. The left and right hepatic lobes are affected. A representative mass in the left hepatic lobe on series 4, image 122 measures 3 cm in greatest dimension. No other abnormalities are identified in the upper abdomen. Musculoskeletal: Sclerosis is identified in the anterolateral aspects of the left fourth, fifth, 6, seventh, and eighth ribs. These findings are new since February 2023. There are also new healing fractures through the medial clavicles. No evidence of bony metastatic disease identified. IMPRESSION: 1. The masslike opacity in the left upper lobe identified on today's x-ray is new compared to August 02, 2021 and obscures the site of the previously treated malignancy. The overall patchy appearance, lack of masslike findings, and presence of air bronchograms suggests this finding is not likely to represent recurrence. Radiation changes are most likely. Pneumonia could have this appearance but is less likely unless the patient has acute symptoms of pneumonia. Recommend attention on short-term follow-up. 2. 2 new nodular regions in the left lower lobe. These findings  could represent metastatic disease, developing infiltrate, or focal atelectasis. Recommend attention on short-term follow-up. 3. New masses in the liver are very concerning for metastatic disease given history and development since February 2023. 4. Healing fractures of the medial clavicles new since February 2023. The sclerosis in multiple adjacent left anterolateral ribs are likely healing fractures as well. 5. Aneurysmal dilatation of the ascending thoracic aorta measures 4.5 cm, stable. 6. Calcified atherosclerotic changes in the thoracic aorta. Coronary artery disease, unchanged. Aortic Atherosclerosis (ICD10-I70.0). Electronically Signed   By: Dorise Bullion III M.D.   On: 11/05/2021 08:20   DG Chest Portable 1 View  Result Date: 11/05/2021 CLINICAL DATA:  71 year old male with history of ongoing chest discomfort. Evaluate for pulmonary edema. EXAM: PORTABLE CHEST 1 VIEW COMPARISON:  Chest x-ray 04/26/2021. FINDINGS: New mass-like opacity in the left mid lung. A fiducial marker is noted in this region. Mild elevation of the left hemidiaphragm. Right lung is clear. No pleural effusions. No pneumothorax. No evidence of pulmonary edema. Mild cardiomegaly. Upper mediastinal contours are within normal limits. Atherosclerotic calcifications are noted in the thoracic aorta. Left-sided pacemaker/AICD with lead tips projecting over the expected location of the right atrium and right ventricle. IMPRESSION: 1. Interval development of a mass-like area in the left mid lung adjacent to a fiducial marker. This could represent recurrent disease in this patient with history of treated lung cancer, however, this may alternatively reflect evolving postradiation changes given completion of SBRT in January 2023 with lack of interval follow-up since that time. Clinical correlation is recommended. Repeat contrast enhanced chest CT could also be considered. 2. Aortic atherosclerosis. Electronically Signed   By: Vinnie Langton  M.D.   On: 11/05/2021 07:11    EKG: Independently reviewed.  Afib with rate 71; prolonged QTc 548; LVH, IVCD; nonspecific ST changes - reviewed by cardiology fellow per Dr. Dayna Barker   Labs on Admission: I have personally reviewed the available labs  and imaging studies at the time of the admission.  Pertinent labs:    Glucose 67, 52, 107, 106, 73, 60, 146, 126, 107, 97 AP 335 Albumin 2.0 AST 45, ALT 47 HS troponin 12 Unremarkable CBC   Assessment and Plan: Principal Problem:   Hypoglycemia Active Problems:   Malignant neoplasm of upper lobe of left lung (HCC)   HTN (hypertension)   Hyperlipidemia   OSA on CPAP   Chronic systolic CHF (congestive heart failure) (HCC)   Goals of care, counseling/discussion   Atrial fibrillation, chronic (HCC)   Persistent hypoglycemia with known h/o DM -Patient with h/o IDDM presenting with hypersomnolence in the setting of persistent hypoglycemia -Uncertain etiology, possibly related to increased dose of Levemir last night (although it was a tiny increase) -He was initially given repeated boluses of D50 but then transitioned to D5 and then D10 -He appears to have stabilized since starting D10 -Will continue to observe overnight on telemetry -Will hold basal insulin, cover with SSI (although if this is needed consistently then it will be time to stop the dextrose infusion) -Hold Jardiance  Lung CA -Completed treatment for adenocarcinoma with SBRT -Repeat imaging today with concern for spread, mets (new nodules in LLL, concern for liver mets) -Needs pulm/onc f/u soon (sees Dr. Valeta Harms from pulm and Dr. Lisbeth Renshaw from rad onc) -Continue fluticasone (budesonide nebs per formulary), albuterol Singulair  Afib -Continue Eliquis -Continue Amiodarone, mexiletine  CAD -s/p stents -Continue Plavix  HTN -Continue Toprol XL (resume in AM due to marginal BPs today)  HLD -Continue Crestor  OSA -Continue CPAP  Chronic systolic CHF -57/8469 echo  with EF 25-30% -Has AICD -Continue Entresto -He does appear to be euvolemic currently but IVF was changed from D5 at 125 to D10 at 75 to provide less overall volume -Will need close monitoring  Goals of care -Discussion with wife - she feels that he is getting tired and may be considering a transition from aggressive care to a comfort route -She reports desire for DNR -Palliative care consulted    Advance Care Planning:   Code Status: DNR   Consults: Palliative care; DM coordinator; [facility] team; nutrition; PT  DVT Prophylaxis: Eliquis  Family Communication: None present; I was unable to reach his wife by telephone at the time of admission and left a message and subsequently spoke with her by telephone  Severity of Illness: The appropriate patient status for this patient is OBSERVATION. Observation status is judged to be reasonable and necessary in order to provide the required intensity of service to ensure the patient's safety. The patient's presenting symptoms, physical exam findings, and initial radiographic and laboratory data in the context of their medical condition is felt to place them at decreased risk for further clinical deterioration. Furthermore, it is anticipated that the patient will be medically stable for discharge from the hospital within 2 midnights of admission.   Author: Karmen Bongo, MD 11/05/2021 5:33 PM  For on call review [email or web address].

## 2021-11-06 DIAGNOSIS — Z9989 Dependence on other enabling machines and devices: Secondary | ICD-10-CM

## 2021-11-06 DIAGNOSIS — G4733 Obstructive sleep apnea (adult) (pediatric): Secondary | ICD-10-CM

## 2021-11-06 DIAGNOSIS — I5022 Chronic systolic (congestive) heart failure: Secondary | ICD-10-CM | POA: Diagnosis not present

## 2021-11-06 DIAGNOSIS — I482 Chronic atrial fibrillation, unspecified: Secondary | ICD-10-CM | POA: Diagnosis not present

## 2021-11-06 DIAGNOSIS — E162 Hypoglycemia, unspecified: Secondary | ICD-10-CM | POA: Diagnosis not present

## 2021-11-06 DIAGNOSIS — C3412 Malignant neoplasm of upper lobe, left bronchus or lung: Secondary | ICD-10-CM

## 2021-11-06 DIAGNOSIS — Z7189 Other specified counseling: Secondary | ICD-10-CM | POA: Diagnosis not present

## 2021-11-06 DIAGNOSIS — E10649 Type 1 diabetes mellitus with hypoglycemia without coma: Secondary | ICD-10-CM | POA: Diagnosis not present

## 2021-11-06 LAB — BASIC METABOLIC PANEL
Anion gap: 5 (ref 5–15)
BUN: 10 mg/dL (ref 8–23)
CO2: 27 mmol/L (ref 22–32)
Calcium: 8 mg/dL — ABNORMAL LOW (ref 8.9–10.3)
Chloride: 106 mmol/L (ref 98–111)
Creatinine, Ser: 1.1 mg/dL (ref 0.61–1.24)
GFR, Estimated: >60 mL/min (ref >60)
Glucose, Bld: 149 mg/dL — ABNORMAL HIGH (ref 70–99)
Potassium: 3.7 mmol/L (ref 3.5–5.1)
Sodium: 138 mmol/L (ref 135–145)

## 2021-11-06 LAB — CBC
HCT: 35.5 % — ABNORMAL LOW (ref 39.0–52.0)
Hemoglobin: 10.8 g/dL — ABNORMAL LOW (ref 13.0–17.0)
MCH: 25.7 pg — ABNORMAL LOW (ref 26.0–34.0)
MCHC: 30.4 g/dL (ref 30.0–36.0)
MCV: 84.3 fL (ref 80.0–100.0)
Platelets: 292 10*3/uL (ref 150–400)
RBC: 4.21 MIL/uL — ABNORMAL LOW (ref 4.22–5.81)
RDW: 16.4 % — ABNORMAL HIGH (ref 11.5–15.5)
WBC: 7.2 10*3/uL (ref 4.0–10.5)
nRBC: 0 % (ref 0.0–0.2)

## 2021-11-06 LAB — GLUCOSE, CAPILLARY
Glucose-Capillary: 194 mg/dL — ABNORMAL HIGH (ref 70–99)
Glucose-Capillary: 186 mg/dL — ABNORMAL HIGH (ref 70–99)
Glucose-Capillary: 165 mg/dL — ABNORMAL HIGH (ref 70–99)
Glucose-Capillary: 154 mg/dL — ABNORMAL HIGH (ref 70–99)

## 2021-11-06 MED ORDER — ADULT MULTIVITAMIN W/MINERALS CH
1.0000 | ORAL_TABLET | Freq: Every day | ORAL | Status: DC
  Administered 2021-11-06: 1 via ORAL
  Filled 2021-11-06: qty 1

## 2021-11-06 MED ORDER — ENSURE ENLIVE PO LIQD
237.0000 mL | Freq: Three times a day (TID) | ORAL | Status: DC
  Administered 2021-11-06: 237 mL via ORAL

## 2021-11-06 NOTE — Progress Notes (Signed)
Daily Progress Note   Patient Name: John [name]       Date: 11/06/2021 DOB: [birth year]  Age: 71 y.o. MRN#: [id number] Attending Physician: John Cheney, MD Primary Care Physician: John Frizzle, MD Admit Date: 11/05/2021  Reason for Consultation/Follow-up: Establishing goals of care  Subjective: Medical records reviewed. Patient assessed at the bedside. Discussed with RN.  Patient is ambulating from the restroom using a walker, denies any acute concerns and feels like he is back to himself.  No family present during my visit.  We discussed the benefits of outpatient palliative care referral for ongoing goals of care discussion.  He feels that at this time he is not getting better but also not getting worse and declines referral.  Questions and concerns addressed. PMT will continue to support holistically.   Length of Stay: 0   Physical Exam Vitals and nursing note reviewed.  Constitutional:      General: He is not in acute distress. Pulmonary:     Effort: Pulmonary effort is normal. No respiratory distress.  Skin:    General: Skin is warm.  Neurological:     Mental Status: He is alert and oriented to person, place, and time.  Psychiatric:        Mood and Affect: Mood normal.        Behavior: Behavior is cooperative.            Vital Signs: BP 109/66 (BP Location: Left Arm)   Pulse 70   Temp 97.7 F (36.5 C) (Oral)   Resp 19   Ht 6' (1.829 m)   Wt 85.1 kg   SpO2 95%   BMI 25.44 kg/m  SpO2: SpO2: 95 % O2 Device: O2 Device: Room Air O2 Flow Rate: O2 Flow Rate (L/min): 1 L/min      Palliative Assessment/Data: 60%       Palliative Care Assessment & Plan   Patient Profile: 71 y.o. male  with past medical history of afib on Eliquis; CAD s/p stents; DM; HTN;  HLD; CVA; lung cancer; and OSA on CPAP admitted on 11/05/2021 with chest pain.    Patient may have taken extra insulin, now denying chest pain. Family is considering transition to comfort care. PMT has been consulted to assist with goals of care conversation.  Assessment: Goals of care conversation  Hypoglycemia DM Weakness  Recommendations/Plan: DNR Continue full scope treatment Patient is to be discharged today, declines outpatient palliative care PMT remains available for acute needs   Prognosis:  Unable to determine  Discharge Planning: home  Care plan was discussed with patient   Total time: I spent 25 minutes in the care of the patient today in the above activities and documenting the encounter.         John [name] John Litter, PA-C  Palliative Medicine Team Team phone # [phone]  Thank you for allowing the Palliative Medicine Team to assist in the care of this patient. Please utilize secure chat with additional questions, if there is no response within 30 minutes please call the above phone number.  Palliative Medicine Team providers are available by phone from 7am to 7pm daily and can be reached through the team cell phone.  Should this patient require assistance outside of these hours, please call the patient's attending physician.

## 2021-11-06 NOTE — Progress Notes (Signed)
PT Cancellation Note and Discharge  Patient Details Name: John [name] MRN: [id number] DOB: February 16, 1951   Cancelled Treatment:    Reason Eval/Treat Not Completed: PT screened, no needs identified, will sign off. Discussed pt case with OT who states pt is back to baseline of function. Formal PT evaluation is not warranted at this time. Will sign off, if needs change please reconsult.    Thelma Comp 11/06/2021, 10:17 AM  Rolinda Roan, PT, DPT Acute Rehabilitation Services Secure Chat Preferred Office: [phone]

## 2021-11-06 NOTE — Evaluation (Signed)
Occupational Therapy Evaluation Patient Details Name: John [name] MRN: [id number] DOB: [birth year] Today's Date: 11/06/2021   History of Present Illness Pt is a 71 y/o male who presents 11/05/21 with slurred speech. Pt hypoglycemic when EMS arrived and symptoms improved with dextrose, however blood sugars continued to drop in ED and pt was put on a D5 infusion. CT negative for stroke. Chest x-ray with concern for new lesion vs radiation effect in L lung, awaiting CT results for more info. PMH significant for DVT, a-flutter, CAD, DM, HTN, ischemic cardiomyopathy, L foot drop s/p past lumbar surgery, lung CA, MI, osteomyolitis s/p L 2nd toe amputation 2022, pulmonary nodule, recurrent v-tach s/p ablation, SOB.   Clinical Impression   John [name] was evaluated s/p the above admission list. He is generally mod I with use of RW at baseline. He lives in a 1 level home, 4 STE, with his wife who can be home 24/7 to assist. He does have residual DV from a prior stroke that he wears an eye patch for, and he has since stopped driving. Upon evaluation pt demonstrated mod I ability to transfer, ambulate and complete ADLs with RW. He states all symptoms have resolved and he feels as if he is back to baseline. Pt does not have acute OT needs. Recommend d/c to home without OT follow up.      Recommendations for follow up therapy are one component of a multi-disciplinary discharge planning process, led by the attending physician.  Recommendations may be updated based on patient status, additional functional criteria and insurance authorization.   Follow Up Recommendations  No OT follow up    Assistance Recommended at Discharge PRN  Patient can return home with the following Assist for transportation    Functional Status Assessment  Patient has had a recent decline in their functional status and demonstrates the ability to make significant improvements in function in a reasonable and predictable amount of time.   Equipment Recommendations  None recommended by OT       Precautions / Restrictions Precautions Precautions: Fall Precaution Comments: L eye patch Restrictions Weight Bearing Restrictions: No      Mobility Bed Mobility Overal bed mobility: Modified Independent                  Transfers Overall transfer level: Modified independent                        Balance Overall balance assessment: No apparent balance deficits (not formally assessed)                                         ADL either performed or assessed with clinical judgement   ADL Overall ADL's : Needs assistance/impaired;At baseline                                       General ADL Comments: Pt was supervision for all ADLs and functional mobility assessed. RW used with good management. Good insight to safety.     Vision Baseline Vision/History: 1 Wears glasses Ability to See in Adequate Light: 1 Impaired Patient Visual Report: No change from baseline Vision Assessment?: Vision impaired- to be further tested in functional context Additional Comments: pt has DV at baseline. Wears a patch over his L eye  Pertinent Vitals/Pain Pain Assessment Pain Assessment: No/denies pain     Hand Dominance Right   Extremity/Trunk Assessment Upper Extremity Assessment Upper Extremity Assessment: Generalized weakness   Lower Extremity Assessment Lower Extremity Assessment: Defer to PT evaluation;Generalized weakness   Cervical / Trunk Assessment Cervical / Trunk Assessment: Kyphotic (mild)   Communication Communication Communication: No difficulties   Cognition Arousal/Alertness: Awake/alert Behavior During Therapy: WFL for tasks assessed/performed Overall Cognitive Status: Within Functional Limits for tasks assessed                                       General Comments  VSS on RA     Home Living Family/patient expects to be  discharged to:: Private residence Living Arrangements: Spouse/significant other Available Help at Discharge: Family;Available 24 hours/day Type of Home: House Home Access: Stairs to enter;Ramped entrance Entrance Stairs-Number of Steps: 4 Entrance Stairs-Rails: Right;Left Home Layout: One level     Bathroom Shower/Tub: Occupational psychologist: Standard Bathroom Accessibility: Yes   Home Equipment: Shower seat;Grab bars - tub/shower;Hand held shower head;Other (comment);Cane - single Barista (2 wheels)          Prior Functioning/Environment Prior Level of Function : Independent/Modified Independent;Driving             Mobility Comments: walks with RW ADLs Comments: indep, does not drive due to vision        OT Problem List: Decreased activity tolerance      OT Treatment/Interventions:      OT Goals(Current goals can be found in the care plan section) Acute Rehab OT Goals Patient Stated Goal: home OT Goal Formulation: With patient Time For Goal Achievement: 11/06/21   AM-PAC OT "6 Clicks" Daily Activity     Outcome Measure Help from another person eating meals?: None Help from another person taking care of personal grooming?: None Help from another person toileting, which includes using toliet, bedpan, or urinal?: None Help from another person bathing (including washing, rinsing, drying)?: None Help from another person to put on and taking off regular upper body clothing?: None Help from another person to put on and taking off regular lower body clothing?: None 6 Click Score: 24   End of Session Equipment Utilized During Treatment: Rolling walker (2 wheels) Nurse Communication: Mobility status  Activity Tolerance: Patient tolerated treatment well Patient left: in bed;with call bell/phone within reach  OT Visit Diagnosis: Unsteadiness on feet (R26.81);Muscle weakness (generalized) (M62.81)                Time: 3953-2023 OT Time  Calculation (min): 16 min Charges:  OT General Charges $OT Visit: 1 Visit OT Evaluation $OT Eval Low Complexity: 1 Low   Kerby Borner A Nari Vannatter 11/06/2021, 10:23 AM

## 2021-11-06 NOTE — Progress Notes (Signed)
Initial Nutrition Assessment  DOCUMENTATION CODES:   Not applicable  INTERVENTION:   -Liberalize diet to regular diet widest variety of meal selections -Ensure Enlive po TID, each supplement provides 350 kcal and 20 grams of protein -MVI with minerals daily  NUTRITION DIAGNOSIS:   Increased nutrient needs related to chronic illness (lung cancer) as evidenced by estimated needs.  GOAL:   Patient will meet greater than or equal to 90% of their needs  MONITOR:   PO intake, Supplement acceptance  REASON FOR ASSESSMENT:   Consult Assessment of nutrition requirement/status  ASSESSMENT:   Pt with medical history significant of afib on Eliquis; CAD s/p stents; DM; HTN; HLD; CVA; lung cancer; and OSA on CPAP presenting with chest pain.  Pt admitted with persistent hypoglycemia.   Reviewed I/O's: +132 ml x 24 hours  UOP: 1.3 L x 24 hours  Pt unavailable at time of visit. Attempted to speak with pt via call to hospital room phone, however, unable to reach. RD unable to obtain further nutrition-related history or complete nutrition-focused physical exam at this time.    Pt currently on a heart healthy diet. Noted fair meal completions (50%).   Reviewed wt hx; pt has experienced a 11.4% wt loss over the past 6 months, which is significant for time frame. Pt also with deep pitting edema which may be masking further weight loss as well as fat and muscle depletions.   Medications reviewed.   Palliative care following; pt desires full scope treatment.   Lab Results  Component Value Date   HGBA1C 6.9 (H) 04/18/2021   PTA DM medications are 5-20 units insulin aspart TID, 25 mg jardiance daily, and 46 units insulin detemir daily.   Labs reviewed: CBGS: 175-194 (inpatient orders for glycemic control are 0-6 units insulin aspart TID with meals).    Diet Order:   Diet Order             Diet heart healthy/carb modified Room service appropriate? Yes; Fluid consistency: Thin  Diet  effective ____                   EDUCATION NEEDS:   No education needs have been identified at this time  Skin:  Skin Assessment: Skin Integrity Issues: Skin Integrity Issues:: Other (Comment) Other: skin tear to lt elbow  Last BM:  11/04/21  Height:   Ht Readings from Last 1 Encounters:  11/05/21 6' (1.829 m)    Weight:   Wt Readings from Last 1 Encounters:  11/06/21 85.1 kg    Ideal Body Weight:  80.9 kg  BMI:  Body mass index is 25.44 kg/m.  Estimated Nutritional Needs:   Kcal:  2200-2400  Protein:  120-135 grams  Fluid:  > 2 L    Loistine Chance, RD, LDN, [place] Registered Dietitian II Certified Diabetes Care and Education Specialist Please refer to [facility] for RD and/or RD on-call/weekend/after hours pager

## 2021-11-06 NOTE — Discharge Summary (Signed)
PatientPhysician Discharge Summary  John [name] VPX:[id number] DOB: 06/04/1951 DOA: 11/05/2021  PCP: [name], MD  Admit date: 11/05/2021 Discharge date: 11/06/2021    Admitted From: Home Disposition: Home  Recommendations for Outpatient Follow-up:  Follow up with PCP in 1-2 weeks Please obtain BMP/CBC in one week Please follow up with your PCP on the following pending results: Unresulted Labs (From admission, onward)    None         Home Health: None Equipment/Devices: None  Discharge Condition: Stable CODE STATUS: DNR Diet recommendation: Cardiac  Subjective: Seen and examined.  No complaints.  Fully alert and oriented.  Desires to go home today.  Following HPI and ED course is copied from admitting hospitalist colleague Dr. Lorin Mercy H&P. HPI: John [name] is a 71 y.o. male with medical history significant of afib on Eliquis; CAD s/p stents; DM; HTN; HLD; CVA; lung cancer; and OSA on CPAP presenting with chest pain.  He denies chest pain and just reports being very sleepy.  His wife reports that this morning he was not talking well, slurred speech.  He was very thirsty and he was sweating all over.  He thought he was dying.  He takes his insulin before bed, ate ok last night and she doesn't think he changed anything.  She has noticed in the last couple of weeks that he is coughing more, unusual for him.  He sees Dr. Valeta Harms for his lung issues.  They had palliative care support during his last hospitalization and they are open to in-home support.  "He is so tired and weary, I just want him to have some peace."  She is considering transition to comfort only.       ER Course:  Hypoglycemic, persistent.  ?took extra insulin.  Difficulty speaking, weakness.  +chest discomfort, different from prior ACS.  Glucose 50 with EMS, given sugar and better but >100 -> 67 -> 52.  Starting D5 infusion.  Glucose 300 last night, took 2 extra units.  EKG abnormal, cards fellow reviewed,  suggests monitoring troponin and call if needed.  CXR with ?new malignancy, finished treatment in January for prior cancer, CT pending.    Brief/Interim Summary: Briefly, patient symptoms of chest pain and diaphoresis were likely secondary to hypoglycemia.  Patient was admitted, started on dextrose fluid which was stopped at around 7 AM and since then patient's blood sugar has remained over 150.  Patient is fully alert and oriented.  He desires to go home.  PT OT saw him and they think that he is at his baseline and does not require any further PT OT at home.  He is being discharged in stable condition and will resume all his home medications.  He needs to follow-up with his pulmonologist and radiation oncologist.  Lung CA -Completed treatment for adenocarcinoma with SBRT -Repeat imaging this hospitalization with concern for spread, mets (new nodules in LLL, concern for liver mets) -Needs pulm/onc f/u soon (sees Dr. Valeta Harms from pulm and Dr. Lisbeth Renshaw from rad onc)  Discharge plan was discussed with patient and/or family member and they verbalized understanding and agreed with it.  Discharge Diagnoses:  Principal Problem:   Hypoglycemia Active Problems:   Malignant neoplasm of upper lobe of left lung (HCC)   HTN (hypertension)   Hyperlipidemia   OSA on CPAP   Chronic systolic CHF (congestive heart failure) (HCC)   Goals of care, counseling/discussion   Atrial fibrillation, chronic [facility])    Discharge Instructions   Allergies  as of 11/06/2021       Reactions   Omega-3 Fatty Acids Hives, Itching   Benazepril Other (See Comments)   hyperkalemia   Fish Allergy Itching        Medication List     TAKE these medications    acetaminophen 500 MG tablet Commonly known as: TYLENOL Take 500 mg by mouth every 6 (six) hours as needed (pain).   albuterol 108 (90 Base) MCG/ACT inhaler Commonly known as: VENTOLIN HFA Inhale 2 puffs into the lungs every 6 (six) hours as needed for wheezing or  shortness of breath.   amiodarone 400 MG tablet Commonly known as: PACERONE Take 400 mg by mouth 2 (two) times daily.   apixaban 5 MG Tabs tablet Commonly known as: Eliquis Take 1 tablet (5 mg total) by mouth 2 (two) times daily.   Arnuity Ellipta 200 MCG/ACT Aepb Generic drug: Fluticasone Furoate Inhale 1 puff into the lungs daily. What changed: when to take this   clopidogrel 75 MG tablet Commonly known as: PLAVIX Take 1 tablet (75 mg total) by mouth daily with breakfast.   Entresto 24-26 MG Generic drug: sacubitril-valsartan Take 1 tablet by mouth 2 (two) times daily.   furosemide 40 MG tablet Commonly known as: LASIX Take 1 tablet (40 mg total) by mouth every other day.   Jardiance 25 MG Tabs tablet Generic drug: empagliflozin TAKE 1 TABLET BY MOUTH EVERY DAY What changed:  how much to take when to take this   Levemir FlexTouch 100 UNIT/ML FlexPen Generic drug: insulin detemir INJECT 46 UNITS INTO THE SKIN DAILY. DX:E11.9 What changed: See the new instructions.   metoprolol succinate 50 MG 24 hr tablet Commonly known as: TOPROL-XL Take 1 tablet (50 mg total) by mouth daily. Take with or immediately following a meal.   mexiletine 150 MG capsule Commonly known as: MEXITIL TAKE 2 CAPSULES (300 MG TOTAL) BY MOUTH EVERY 12 (TWELVE) HOURS. What changed: how much to take   montelukast 10 MG tablet Commonly known as: SINGULAIR Take 1 tablet (10 mg total) by mouth daily. What changed: when to take this   nitroGLYCERIN 0.4 MG SL tablet Commonly known as: NITROSTAT PLACE 1 TABLET (0.4 MG TOTAL) UNDER THE TONGUE EVERY 5 (FIVE) MINUTES AS NEEDED FOR CHEST PAIN.   Novofine Pen Needle 32G X 6 MM Misc Generic drug: Insulin Pen Needle 1 each by Does not apply route daily.   NovoLOG FlexPen 100 UNIT/ML FlexPen Generic drug: insulin aspart INJECT 5-20 UNITS SUBCUTANEOUSLY WITH LUNCH AND DINNER What changed:  how much to take how to take this when to take  this additional instructions   OneTouch Ultra test strip Generic drug: glucose blood Use as instructed   rosuvastatin 40 MG tablet Commonly known as: CRESTOR Take 1 tablet (40 mg total) by mouth daily. What changed: when to take this        Follow-up Information     [name], MD Follow up in 1 week(s).   Specialty: Family Medicine Contact information: [address] [place] [address] [phone]         Troy Sine, MD .   Specialty: Cardiology Contact information: [address] [place] [address] [phone]         Constance Haw, MD .   Specialty: Cardiology Contact information: [address] [place] [address] [phone]  Allergies  Allergen Reactions   Omega-3 Fatty Acids Hives and Itching   Benazepril Other (See Comments)    hyperkalemia   Fish Allergy Itching    Consultations: None   Procedures/Studies: CT Head Wo Contrast  Result Date: 11/05/2021 CLINICAL DATA:  71 year old male with history of mental status change. EXAM: CT HEAD WITHOUT CONTRAST TECHNIQUE: Contiguous axial images were obtained from the base of the skull through the vertex without intravenous contrast. RADIATION DOSE REDUCTION: This exam was performed according to the departmental dose-optimization program which includes automated exposure control, adjustment of the mA and/or kV according to patient size and/or use of iterative reconstruction technique. COMPARISON:  Head CT 02/15/2021. FINDINGS: Brain: No evidence of acute infarction, hemorrhage, hydrocephalus, extra-axial collection or mass lesion/mass effect. Vascular: No hyperdense vessel or unexpected calcification. Skull: Normal. Negative for fracture or focal lesion. Sinuses/Orbits: No acute finding. Other: None. IMPRESSION: 1. No acute intracranial abnormalities. The appearance of the brain is normal. Electronically Signed   By: Vinnie Langton M.D.   On: 11/05/2021 06:15   CT Chest W Contrast  Result Date: 11/05/2021 CLINICAL DATA:  Abnormal x-ray. History of non-small cell lung cancer diagnosed in 2021. Status post SBRT completed in January 2023. EXAM: CT CHEST WITH CONTRAST TECHNIQUE: Multidetector CT imaging of the chest was performed during intravenous contrast administration. RADIATION DOSE REDUCTION: This exam was performed according to the departmental dose-optimization program which includes automated exposure control, adjustment of the mA and/or kV according to patient size and/or use of iterative reconstruction technique. CONTRAST:  177mL OMNIPAQUE IOHEXOL 300 MG/ML  SOLN COMPARISON:  CT scan of the chest August 02, 2021 FINDINGS: Cardiovascular: Mild calcified atherosclerotic changes identified in the thoracic aorta. The ascending thoracic aorta measures 4.5 cm in AP diameter on series 4, image 69 which is unchanged. No dissection. Central pulmonary arteries are unremarkable. Left-sided coronary artery disease versus stents is again identified. The heart is unchanged. Mediastinum/Nodes: No pleural or pericardial effusions are identified. The chest wall is normal. The thyroid and esophagus are normal. No adenopathy is identified. Lungs/Pleura: The trachea and mainstem bronchi are normal. No pneumothorax. A stable nodule associated with the right minor fissure is considered benign. No new or suspicious nodules, masses, or infiltrates in the right lung. At the site of the patient's primary malignancy in the left upper lobe, the previously identified nodule is no longer appreciated. Instead, there is a pulmonary opacity primarily in the left upper lobe, at the site of the patient's previous malignancy and radiation. There is also a opacity in the anterior aspect of the left lower lobe superior segment. There are air bronchograms within this opacity. This opacity is patchy in appearance. There is a new nodular opacity in the posterior  left lower lobe identified on series 5, images 103 through 111. The finding is also well seen on coronal images 124 through 127. This nodular opacity measures 8 x 9 by 25 mm in transverse, AP, and craniocaudal dimensions. Another somewhat nodular density is identified in the medial aspect of the left lower lobe on series 5, image 66 and coronal image 126 measuring 8 x 6 by 12 mm. No other nodules are identified in the left lung. Upper Abdomen: There are new masses within the liver worrisome for metastatic disease. The left and right hepatic lobes are affected. A representative mass in the left hepatic lobe on series 4, image 122 measures 3 cm in greatest dimension. No other abnormalities are identified in the upper abdomen. Musculoskeletal: Sclerosis  is identified in the anterolateral aspects of the left fourth, fifth, 6, seventh, and eighth ribs. These findings are new since February 2023. There are also new healing fractures through the medial clavicles. No evidence of bony metastatic disease identified. IMPRESSION: 1. The masslike opacity in the left upper lobe identified on today's x-ray is new compared to August 02, 2021 and obscures the site of the previously treated malignancy. The overall patchy appearance, lack of masslike findings, and presence of air bronchograms suggests this finding is not likely to represent recurrence. Radiation changes are most likely. Pneumonia could have this appearance but is less likely unless the patient has acute symptoms of pneumonia. Recommend attention on short-term follow-up. 2. 2 new nodular regions in the left lower lobe. These findings could represent metastatic disease, developing infiltrate, or focal atelectasis. Recommend attention on short-term follow-up. 3. New masses in the liver are very concerning for metastatic disease given history and development since February 2023. 4. Healing fractures of the medial clavicles new since February 2023. The sclerosis in  multiple adjacent left anterolateral ribs are likely healing fractures as well. 5. Aneurysmal dilatation of the ascending thoracic aorta measures 4.5 cm, stable. 6. Calcified atherosclerotic changes in the thoracic aorta. Coronary artery disease, unchanged. Aortic Atherosclerosis (ICD10-I70.0). Electronically Signed   By: Dorise Bullion III M.D.   On: 11/05/2021 08:20   DG Chest Portable 1 View  Result Date: 11/05/2021 CLINICAL DATA:  71 year old male with history of ongoing chest discomfort. Evaluate for pulmonary edema. EXAM: PORTABLE CHEST 1 VIEW COMPARISON:  Chest x-ray 04/26/2021. FINDINGS: New mass-like opacity in the left mid lung. A fiducial marker is noted in this region. Mild elevation of the left hemidiaphragm. Right lung is clear. No pleural effusions. No pneumothorax. No evidence of pulmonary edema. Mild cardiomegaly. Upper mediastinal contours are within normal limits. Atherosclerotic calcifications are noted in the thoracic aorta. Left-sided pacemaker/AICD with lead tips projecting over the expected location of the right atrium and right ventricle. IMPRESSION: 1. Interval development of a mass-like area in the left mid lung adjacent to a fiducial marker. This could represent recurrent disease in this patient with history of treated lung cancer, however, this may alternatively reflect evolving postradiation changes given completion of SBRT in January 2023 with lack of interval follow-up since that time. Clinical correlation is recommended. Repeat contrast enhanced chest CT could also be considered. 2. Aortic atherosclerosis. Electronically Signed   By: Vinnie Langton M.D.   On: 11/05/2021 07:11   CUP PACEART REMOTE DEVICE CHECK  Result Date: 10/13/2021 Scheduled remote reviewed. Normal device function.  Next remote 91 days. Kathy Breach, RN, CCDS, [employer]    Discharge Exam: Vitals:   11/06/21 1000 11/06/21 1100  BP:    Pulse: 76 70  Resp:    Temp:    SpO2: 95% 95%    Vitals:   11/06/21 0800 11/06/21 0900 11/06/21 1000 11/06/21 1100  BP:      Pulse: 70 70 76 70  Resp: (!) 24 19    Temp:      TempSrc:      SpO2: 98% 95% 95% 95%  Weight:      Height:        General: Pt is alert, awake, not in acute distress Cardiovascular: RRR, S1/S2 +, no rubs, no gallops Respiratory: CTA bilaterally, no wheezing, no rhonchi Abdominal: Soft, NT, ND, bowel sounds + Extremities: no edema, no cyanosis    The results of significant diagnostics from this hospitalization (including  imaging, microbiology, ancillary and laboratory) are listed below for reference.     Microbiology: No results found for this or any previous visit (from the past 240 hour(s)).   Labs: BNP (last 3 results) Recent Labs    01/17/21 2233 11/05/21 0507  BNP 169.7* 552.0*   Basic Metabolic Panel: Recent Labs  Lab 11/05/21 0507 11/06/21 0317  NA 141 138  K 3.5 3.7  CL 109 106  CO2 24 27  GLUCOSE 52* 149*  BUN 15 10  CREATININE 1.12 1.10  CALCIUM 8.6* 8.0*   Liver Function Tests: Recent Labs  Lab 11/05/21 0507  AST 45*  ALT 47*  ALKPHOS 335*  BILITOT 0.4  PROT 5.6*  ALBUMIN 2.0*   No results for input(s): LIPASE, AMYLASE in the last 168 hours. No results for input(s): AMMONIA in the last 168 hours. CBC: Recent Labs  Lab 11/05/21 0507 11/06/21 0317  WBC 7.3 7.2  NEUTROABS 6.0  --   HGB 11.9* 10.8*  HCT 40.8 35.5*  MCV 89.1 84.3  PLT 324 292   Cardiac Enzymes: No results for input(s): CKTOTAL, CKMB, CKMBINDEX, TROPONINI in the last 168 hours. BNP: Invalid input(s): POCBNP CBG: Recent Labs  Lab 11/05/21 2140 11/06/21 0619 11/06/21 1043 11/06/21 1136 11/06/21 1243  GLUCAP 175* 194* 186* 165* 154*   D-Dimer Recent Labs    11/05/21 0721  DDIMER 0.79*   Hgb A1c No results for input(s): HGBA1C in the last 72 hours. Lipid Profile No results for input(s): CHOL, HDL, LDLCALC, TRIG, CHOLHDL, LDLDIRECT in the last 72 hours. Thyroid function  studies No results for input(s): TSH, T4TOTAL, T3FREE, THYROIDAB in the last 72 hours.  Invalid input(s): FREET3 Anemia work up No results for input(s): VITAMINB12, FOLATE, FERRITIN, TIBC, IRON, RETICCTPCT in the last 72 hours. Urinalysis    Component Value Date/Time   COLORURINE YELLOW 01/18/2021 0950   APPEARANCEUR CLEAR 01/18/2021 0950   LABSPEC 1.024 01/18/2021 0950   PHURINE 5.0 01/18/2021 0950   GLUCOSEU >=500 (A) 01/18/2021 0950   HGBUR NEGATIVE 01/18/2021 0950   BILIRUBINUR NEGATIVE 01/18/2021 0950   KETONESUR 5 (A) 01/18/2021 0950   PROTEINUR NEGATIVE 01/18/2021 0950   UROBILINOGEN 1.0 02/27/2012 0001   NITRITE NEGATIVE 01/18/2021 0950   LEUKOCYTESUR NEGATIVE 01/18/2021 0950   Sepsis Labs Invalid input(s): PROCALCITONIN,  WBC,  LACTICIDVEN Microbiology No results found for this or any previous visit (from the past 240 hour(s)).   Time coordinating discharge: Over 30 minutes  SIGNED:   Darliss Cheney, MD  [facility] 11/06/2021, 12:47 PM *Please note that this is a verbal dictation therefore any spelling or grammatical errors are due to the "[employer]" system interpretation. If 7PM-7AM, please contact night-coverage [email or web address]

## 2021-11-07 ENCOUNTER — Telehealth: Payer: Self-pay

## 2021-11-07 NOTE — Telephone Encounter (Signed)
Transition Care Management Unsuccessful Follow-up Telephone Call  Date of discharge and from where:  11/06/21 [facility]   Attempts:  1st Attempt  Reason for unsuccessful TCM follow-up call:  Left voice message

## 2021-11-08 NOTE — Telephone Encounter (Signed)
Transition Care Management Unsuccessful Follow-up Telephone Call  Date of discharge and from where:  11/06/21 [facility]  Attempts:  2nd Attempt  Reason for unsuccessful TCM follow-up call:  Left voice message

## 2021-11-08 NOTE — Telephone Encounter (Signed)
Transition Care Management Follow-up Telephone Call Date of discharge and from where: 11/06/21 [name] How have you been since you were released from the hospital? Pt states he is doing better. Any questions or concerns? No  Items Reviewed: Did the pt receive and understand the discharge instructions provided? Yes  Medications obtained and verified? Yes  Other? No  Any new allergies since your discharge? No  Dietary orders reviewed? Yes Do you have support at home? Yes   Home Care and Equipment/Supplies: Were home health services ordered? not applicable If so, what is the name of the agency? N/A  Has the agency set up a time to come to the patient's home? not applicable Were any new equipment or medical supplies ordered?  No What is the name of the medical supply agency? N/A Were you able to get the supplies/equipment? not applicable Do you have any questions related to the use of the equipment or supplies? No  Functional Questionnaire: (I = Independent and D = Dependent) ADLs: I  Bathing/Dressing- I  Meal Prep- I  Eating- I  Maintaining continence- I  Transferring/Ambulation- I  Managing Meds- I  Follow up appointments reviewed:  PCP Hospital f/u appt confirmed? Yes  Scheduled to see [name]  on 11/15/21 @ 2:15. [facility] f/u appt confirmed? No   Are transportation arrangements needed? No  If their condition worsens, is the pt aware to call PCP or go to the Emergency Dept.? Yes Was the patient provided with contact information for the PCP's office or ED? Yes Was to pt encouraged to call back with questions or concerns? Yes

## 2021-11-15 ENCOUNTER — Ambulatory Visit (INDEPENDENT_AMBULATORY_CARE_PROVIDER_SITE_OTHER): Payer: Medicare Other

## 2021-11-15 ENCOUNTER — Ambulatory Visit (INDEPENDENT_AMBULATORY_CARE_PROVIDER_SITE_OTHER): Payer: Medicare Other | Admitting: Family Medicine

## 2021-11-15 VITALS — BP 114/68 | HR 80 | Temp 98.1°F | Ht 73.0 in | Wt 184.4 lb

## 2021-11-15 DIAGNOSIS — C3492 Malignant neoplasm of unspecified part of left bronchus or lung: Secondary | ICD-10-CM | POA: Diagnosis not present

## 2021-11-15 DIAGNOSIS — Z95810 Presence of automatic (implantable) cardiac defibrillator: Secondary | ICD-10-CM | POA: Diagnosis not present

## 2021-11-15 DIAGNOSIS — E119 Type 2 diabetes mellitus without complications: Secondary | ICD-10-CM | POA: Diagnosis not present

## 2021-11-15 DIAGNOSIS — H35373 Puckering of macula, bilateral: Secondary | ICD-10-CM | POA: Diagnosis not present

## 2021-11-15 DIAGNOSIS — I255 Ischemic cardiomyopathy: Secondary | ICD-10-CM

## 2021-11-15 DIAGNOSIS — I5042 Chronic combined systolic (congestive) and diastolic (congestive) heart failure: Secondary | ICD-10-CM

## 2021-11-15 MED ORDER — NYSTATIN 100000 UNIT/GM EX CREA: 1.0000 "application " | TOPICAL_CREAM | Freq: Three times a day (TID) | CUTANEOUS | 0 refills | Status: DC

## 2021-11-15 NOTE — Progress Notes (Signed)
Subjective:    Patient ID: John [name], male    DOB: 24-Apr-1951, 71 y.o.   MRN: [id number]  Patient was recently admitted to the hospital with a glycemic episode.  He reports becoming extremely confused and lethargic.  He was unable to stand up.  EMS was contacted and he was carried to the hospital on ability.  He has no recollection of this.  He was discharged home from the hospital the next day.  He was previously taking 46 units of insulin.  However he has lost almost 27 pounds since December.  He states that he is eating better however he appears frail and malnourished.  During that same time he is undergoing radiation therapy for lung cancer in his left upper lobe.  With the weight loss, his sugars have been following.  As result he decreased his insulin to 26 units daily and since decreasing his insulin to 26 units he has not had a hypoglycemic episode.  His lowest sugar that he is reported has been 90.  However, in the hospital they did a CAT scan which showed possible metastasis from his previous left upper lobe malignancy.  See below:  FINDINGS: Cardiovascular: Mild calcified atherosclerotic changes identified in the thoracic aorta. The ascending thoracic aorta measures 4.5 cm in AP diameter on series 4, image 69 which is unchanged. No dissection. Central pulmonary arteries are unremarkable. Left-sided coronary artery disease versus stents is again identified. The heart is unchanged.   Mediastinum/Nodes: No pleural or pericardial effusions are identified. The chest wall is normal. The thyroid and esophagus are normal. No adenopathy is identified.   Lungs/Pleura: The trachea and mainstem bronchi are normal. No pneumothorax. A stable nodule associated with the right minor fissure is considered benign. No new or suspicious nodules, masses, or infiltrates in the right lung.   At the site of the patient's primary malignancy in the left upper lobe, the previously identified nodule  is no longer appreciated. Instead, there is a pulmonary opacity primarily in the left upper lobe, at the site of the patient's previous malignancy and radiation. There is also a opacity in the anterior aspect of the left lower lobe superior segment. There are air bronchograms within this opacity. This opacity is patchy in appearance. There is a new nodular opacity in the posterior left lower lobe identified on series 5, images 103 through 111. The finding is also well seen on coronal images 124 through 127. This nodular opacity measures 8 x 9 by 25 mm in transverse, AP, and craniocaudal dimensions. Another somewhat nodular density is identified in the medial aspect of the left lower lobe on series 5, image 66 and coronal image 126 measuring 8 x 6 by 12 mm. No other nodules are identified in the left lung.   Upper Abdomen: There are new masses within the liver worrisome for metastatic disease. The left and right hepatic lobes are affected. A representative mass in the left hepatic lobe on series 4, image 122 measures 3 cm in greatest dimension. No other abnormalities are identified in the upper abdomen.   Musculoskeletal: Sclerosis is identified in the anterolateral aspects of the left fourth, fifth, 6, seventh, and eighth ribs. These findings are new since February 2023. There are also new healing fractures through the medial clavicles. No evidence of bony metastatic disease identified. Past Medical History:  Diagnosis Date   Arm DVT (deep venous thromboembolism), acute, left ([place])    while off eliquis for bronchoscopy 2022   Atrial flutter (  [place])    s/p cardioversion   Coronary artery disease    Diabetes mellitus    GERD (gastroesophageal reflux disease)    History of nuclear stress test 04/04/2011   lexiscan; mod-large in size fixed inferolateral defect (scar); non-diagnostic for ischemia; low risk scan    Hyperlipidemia    Hypertension    Ischemic cardiomyopathy    Left foot  drop    r/t past disk srugery - uses Kevlar brace   Lung cancer ([place]) 04/26/2021   Myocardial infarction [facility])    posterior MI   Osteomyelitis ([place])    s/p left 2nd toe amputation in 01/2021   Pulmonary nodule    Recurrent ventricular tachycardia (HCC)    Shortness of breath    Sleep apnea    on CPAP; 04/28/2007 split-night - AHI during total sleep 44.43/hr and REM 72.56/hr   Past Surgical History:  Procedure Laterality Date   AMPUTATION TOE Left 02/10/2021   Procedure: AMPUTATION  LEFT SECOND TOE;  Surgeon: Edrick Kins, DPM;  Location: [place];  Service: Podiatry;  Laterality: Left;   [place]   BRONCHIAL BIOPSY  04/26/2021   Procedure: BRONCHIAL BIOPSIES;  Surgeon: Garner Nash, DO;  Location: [place] [facility];  Service: Pulmonary;;   BRONCHIAL BRUSHINGS  04/26/2021   Procedure: BRONCHIAL BRUSHINGS;  Surgeon: Garner Nash, DO;  Location: [place] [facility];  Service: Pulmonary;;   BRONCHIAL NEEDLE ASPIRATION BIOPSY  04/26/2021   Procedure: BRONCHIAL NEEDLE ASPIRATION BIOPSIES;  Surgeon: Garner Nash, DO;  Location: [place];  Service: Pulmonary;;   CARDIAC CATHETERIZATION  2010   6 stents total   CARDIAC CATHETERIZATION  01/2000   percutaneous transluminal coronary balloon angioplasty of mid RCA stenotic lesion   CARDIAC CATHETERIZATION  06/2006   no stenting; ischemic cardiomyopathy, EF 40-45%   CARDIOVERSION N/A 07/28/2016   Procedure: CARDIOVERSION;  Surgeon: Troy Sine, MD;  Location: [place];  Service: Cardiovascular;  Laterality: N/A;   CORONARY ANGIOPLASTY  09/1998   mid-distal RCA balloon dilatation, 4.5 & 5.0 stents    CORONARY ANGIOPLASTY WITH STENT PLACEMENT  03/1994   angioplasty & stenting (non-DES) of circumflex/prox ramus intermedius   CORONARY ANGIOPLASTY WITH STENT PLACEMENT  10/1994   large iliac [id number] stent to RCA   [place]  12/2002   4.41mm stents x2 of RCA   CORONARY ANGIOPLASTY WITH STENT PLACEMENT  01/2005    cutting balloon arthrectomy of distal RCA & Cypher DES 3.5x13; cutting balloon arthrectomy of mid RCA with Cypher DES 3.5x18   CORONARY ANGIOPLASTY WITH STENT PLACEMENT  11/2008   stenting of mid RCA with 4.0x39mm driver, non-DES   CORONARY BALLOON ANGIOPLASTY N/A 02/15/2021   Procedure: CORONARY BALLOON ANGIOPLASTY;  Surgeon: Troy Sine, MD;  Location: [place] [facility];  Service: Cardiovascular;  Laterality: N/A;   FIDUCIAL MARKER PLACEMENT  04/26/2021   Procedure: FIDUCIAL MARKER PLACEMENT;  Surgeon: Garner Nash, DO;  Location: [place] [facility];  Service: Pulmonary;;   ICD IMPLANT N/A 08/20/2020   Procedure: ICD IMPLANT;  Surgeon: Constance Haw, MD;  Location: [place] [facility];  Service: Cardiovascular;  Laterality: N/A;   INTRAVASCULAR PRESSURE WIRE/FFR STUDY N/A 03/02/2020   Procedure: INTRAVASCULAR PRESSURE WIRE/FFR STUDY;  Surgeon: Leonie Man, MD;  Location: [place] [facility];  Service: Cardiovascular;  Laterality: N/A;   LEFT HEART CATH AND CORONARY ANGIOGRAPHY N/A 03/02/2020   Procedure: LEFT HEART CATH AND CORONARY ANGIOGRAPHY;  Surgeon: Leonie Man, MD;  Location: [place] [facility];  Service: Cardiovascular;  Laterality: N/A;   LEFT HEART CATH AND CORONARY ANGIOGRAPHY N/A 08/19/2020   Procedure: LEFT HEART CATH AND CORONARY ANGIOGRAPHY;  Surgeon: Lorretta Harp, MD;  Location: [place] [facility];  Service: Cardiovascular;  Laterality: N/A;   LEFT HEART CATH AND CORONARY ANGIOGRAPHY N/A 02/15/2021   Procedure: LEFT HEART CATH AND CORONARY ANGIOGRAPHY;  Surgeon: Troy Sine, MD;  Location: [place] [facility];  Service: Cardiovascular;  Laterality: N/A;   LEFT HEART CATHETERIZATION WITH CORONARY ANGIOGRAM N/A 02/27/2012   Procedure: LEFT HEART CATHETERIZATION WITH CORONARY ANGIOGRAM;  Surgeon: Lorretta Harp, MD;  Location: [facility];  Service: Cardiovascular;  Laterality: N/A;   TRANSTHORACIC ECHOCARDIOGRAM  07/29/2010   EF 50=55%, mod inf wall hypokinesis  & mild post wall hypokinesis; LA mild-mod dilated; mild mitral annular calcif & mild MR; mild TR & elevated RV systolic pressure; AV mildly sclerotic; mild aortic root dilatation    V TACH ABLATION N/A 01/20/2021   Procedure: V TACH ABLATION;  Surgeon: Vickie Epley, MD;  Location: [place] [facility];  Service: Cardiovascular;  Laterality: N/A;   VIDEO BRONCHOSCOPY WITH ENDOBRONCHIAL NAVIGATION Left 04/26/2021   Procedure: VIDEO BRONCHOSCOPY WITH ENDOBRONCHIAL NAVIGATION;  Surgeon: Garner Nash, DO;  Location: [facility];  Service: Pulmonary;  Laterality: Left;  ION w/ fiducial   VIDEO BRONCHOSCOPY WITH RADIAL ENDOBRONCHIAL ULTRASOUND  04/26/2021   Procedure: RADIAL ENDOBRONCHIAL ULTRASOUND;  Surgeon: Garner Nash, DO;  Location: [place] [facility];  Service: Pulmonary;;   Current Outpatient Medications on File Prior to Visit  Medication Sig Dispense Refill   acetaminophen (TYLENOL) 500 MG tablet Take 500 mg by mouth every 6 (six) hours as needed (pain).     albuterol (VENTOLIN HFA) 108 (90 Base) MCG/ACT inhaler Inhale 2 puffs into the lungs every 6 (six) hours as needed for wheezing or shortness of breath. 1 each 6   amiodarone (PACERONE) 400 MG tablet Take 400 mg by mouth 2 (two) times daily.     apixaban (ELIQUIS) 5 MG TABS tablet Take 1 tablet (5 mg total) by mouth 2 (two) times daily. 180 tablet 1   clopidogrel (PLAVIX) 75 MG tablet Take 1 tablet (75 mg total) by mouth daily with breakfast.     Fluticasone Furoate (ARNUITY ELLIPTA) 200 MCG/ACT AEPB Inhale 1 puff into the lungs daily. (Patient taking differently: Inhale 1 puff into the lungs every morning.) 30 each 6   furosemide (LASIX) 40 MG tablet Take 1 tablet (40 mg total) by mouth every other day. 5 tablet 0   glucose blood (ONETOUCH ULTRA) test strip Use as instructed 100 strip 2   insulin aspart (NOVOLOG FLEXPEN) 100 UNIT/ML FlexPen INJECT 5-20 UNITS SUBCUTANEOUSLY WITH LUNCH AND DINNER (Patient taking differently: Inject 2-4 Units  into the skin See admin instructions. Inject 2-4 units subcutaneously midday as needed - CBG 180-200 2 units, >200 4 units.) 15 mL 1   Insulin Pen Needle (NOVOFINE PEN NEEDLE) 32G X 6 MM MISC 1 each by Does not apply route daily. 100 each 5   JARDIANCE 25 MG TABS tablet TAKE 1 TABLET BY MOUTH EVERY DAY (Patient taking differently: Take 25 mg by mouth at bedtime.) 30 tablet 0   LEVEMIR FLEXTOUCH 100 UNIT/ML FlexTouch Pen INJECT 46 UNITS INTO THE SKIN DAILY. DX:E11.9 (Patient taking differently: Inject 46 Units into the skin at bedtime. 8pm . Dx:E11.9) 15 mL 3   mexiletine (MEXITIL) 150 MG capsule TAKE 2 CAPSULES (300 MG  TOTAL) BY MOUTH EVERY 12 (TWELVE) HOURS. (Patient taking differently: Take 150 mg by mouth every 12 (twelve) hours.) 120 capsule 11   montelukast (SINGULAIR) 10 MG tablet Take 1 tablet (10 mg total) by mouth daily. (Patient taking differently: Take 10 mg by mouth at bedtime.) 90 tablet 3   nitroGLYCERIN (NITROSTAT) 0.4 MG SL tablet PLACE 1 TABLET (0.4 MG TOTAL) UNDER THE TONGUE EVERY 5 (FIVE) MINUTES AS NEEDED FOR CHEST PAIN. 25 tablet 1   rosuvastatin (CRESTOR) 40 MG tablet Take 1 tablet (40 mg total) by mouth daily. (Patient taking differently: Take 40 mg by mouth every morning.) 90 tablet 1   sacubitril-valsartan (ENTRESTO) 24-26 MG Take 1 tablet by mouth 2 (two) times daily. 180 tablet 3   metoprolol succinate (TOPROL-XL) 50 MG 24 hr tablet Take 1 tablet (50 mg total) by mouth daily. Take with or immediately following a meal. 30 tablet 6   No current facility-administered medications on file prior to visit.   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 [place]  Tobacco Use   Smoking status: Former    Packs/day: 1.00    Years: 50.00    Pack years: 50.00    Types: Cigarettes    Quit date: 07/20/2016    Years since quitting: 5.3   Smokeless tobacco: Never  Vaping Use   Vaping Use: Never used  Substance and Sexual Activity   Alcohol use: Not Currently    Alcohol/week: 0.0 standard drinks   Drug use: No   Sexual activity: Yes  Other Topics Concern   Not on file  Social History Narrative   Married x 38 years.   Social Determinants of Health   Financial Resource Strain: Low Risk    Difficulty of Paying Living Expenses: Not hard at all  Food Insecurity: No Food Insecurity   Worried About Charity fundraiser in the Last Year: Never true   [place] in the Last Year: Never true  Transportation Needs: No Transportation Needs   Lack of Transportation (Medical): No   Lack of Transportation (Non-Medical): No  Physical Activity: Inactive   Days of Exercise per Week: 0 days   Minutes of Exercise per Session: 0 min  Stress: No Stress Concern Present   Feeling of Stress : Not at all  Social Connections: Moderately Isolated   Frequency of Communication with Friends and Family: Once a week   Frequency of Social Gatherings with Friends and Family: More than three times a week   Attends Religious Services: Never   Marine scientist or Organizations: No   Attends Music therapist: Never   Marital Status: Married  Human resources officer Violence: Not At Risk   Fear of Current or Ex-Partner: No   Emotionally Abused: No   Physically Abused: No   Sexually Abused: No      Review of Systems  All other systems reviewed and are negative.     Objective:   Physical Exam Vitals reviewed.  Constitutional:  General: He is not in acute distress.    Appearance: Normal appearance. He is well-developed and normal weight. He is not ill-appearing or diaphoretic.   HENT:     Nose: Nose normal.     Mouth/Throat:     Pharynx: No oropharyngeal exudate.  Eyes:     Extraocular Movements:     Left eye: Abnormal extraocular motion present.     Conjunctiva/sclera:     Right eye: Right conjunctiva is not injected. No chemosis or exudate. Neck:     Thyroid: No thyromegaly.  Cardiovascular:     Rate and Rhythm: Normal rate and regular rhythm.     Heart sounds: Normal heart sounds. No murmur heard.   No friction rub. No gallop.  Pulmonary:     Effort: Pulmonary effort is normal. No respiratory distress.     Breath sounds: Normal breath sounds. No wheezing or rales.  Abdominal:     General: Bowel sounds are normal. There is no distension.     Palpations: Abdomen is soft. There is no mass.     Tenderness: There is no abdominal tenderness. There is no guarding or rebound.  Musculoskeletal:     Cervical back: Neck supple.     Right lower leg: No edema.     Left lower leg: No edema.  Lymphadenopathy:     Cervical: No cervical adenopathy.  Neurological:     Mental Status: He is alert.     Gait: Gait normal.  Psychiatric:        Mood and Affect: Mood normal.        Behavior: Behavior normal.        Thought Content: Thought content normal.        Judgment: Judgment normal.      Assessment & Plan:  DM type 2, goal HbA1c < 7% (HCC)  Malignant neoplasm of left lung, unspecified part of lung ([place]) I am most concerned about his possible metastasis seen on his CT scan.  I recommended that we try to schedule his PET scan from earlier than previously scheduled in August.  If these are found to be hypermetabolic sites consistent with metastasis, he may need to consider chemotherapy.  Meanwhile decrease his basal insulin to 20 units a day to avoid hypoglycemia.  I will also refer the patient back to his pulmonologist given the new findings on his CAT scan.

## 2021-11-16 DIAGNOSIS — E119 Type 2 diabetes mellitus without complications: Secondary | ICD-10-CM

## 2021-11-16 DIAGNOSIS — Z87891 Personal history of nicotine dependence: Secondary | ICD-10-CM

## 2021-11-16 DIAGNOSIS — I5022 Chronic systolic (congestive) heart failure: Secondary | ICD-10-CM | POA: Diagnosis not present

## 2021-11-16 DIAGNOSIS — Z794 Long term (current) use of insulin: Secondary | ICD-10-CM

## 2021-11-16 DIAGNOSIS — J418 Mixed simple and mucopurulent chronic bronchitis: Secondary | ICD-10-CM | POA: Diagnosis not present

## 2021-11-16 NOTE — Progress Notes (Unsigned)
EPIC Encounter for ICM Monitoring  Patient Name: John [name] is a 71 y.o. male Date: 11/16/2021 Primary Care Physican: [name], MD Primary Cardiologist: [place] Electrophysiologist: Curt Bears 11/16/2021 Weight: 184 lbs        1st ICM Remote Transmission.  Heart Failure questions reviewed.  Pt asymptomatic for fluid accumulation.  Furosemide keeps him up at night and he decreased dosage to every 3rd day.   Pt reports CT scan showed a couple of places on lungs and gallbladder that will need further follow up.  He recently finished radiation to lung for CA.   Hospitalized 5/20-5/21.    Optivol thoracic impedance suggesting possible fluid accumulation starting 08/29/2021.  Fluid index > normal threshold starting 09/12/2021.   Prescribed:  Furosemide 40 mg take 1 tablet(s) (40 mg total) by mouth every other day.  Pt reports 5/31 he is taking Furosemide every 3 days instead of every other day.    Labs: 11/06/2021 Creatinine 1.10, BUN 10, Potassium 3.7, Sodium 138, GFR >60 11/05/2021 Creatinine 1.12, BUN 15, Potassium 3.5, Sodium 141, GFR >60  10/18/2021 Creatinine 1.22, BUN 18, Potassium 4.6, Sodium 140  A complete set of results can be found in Results Review.  Recommendations:  Encouraged to call if experiencing fluid symptoms.  Copy sent to Dr Harriet Masson for review and recommendations if needed.    Follow-up plan: ICM clinic phone appointment on 11/28/2021 to recheck fluid levels.   91 day device clinic remote transmission 01/11/2022.    EP/Cardiology Office Visits: Recall 01/04/2022 with Tommye Standard PA.    Copy of ICM check sent to Dr. Curt Bears.     3 month ICM trend: 11/16/2021.    12-14 Month ICM trend:     Rosalene Billings, RN 11/16/2021 4:12 PM

## 2021-11-18 ENCOUNTER — Ambulatory Visit (INDEPENDENT_AMBULATORY_CARE_PROVIDER_SITE_OTHER): Payer: Medicare Other | Admitting: *Deleted

## 2021-11-18 ENCOUNTER — Other Ambulatory Visit: Payer: Self-pay | Admitting: Family Medicine

## 2021-11-18 ENCOUNTER — Other Ambulatory Visit: Payer: Self-pay | Admitting: Cardiology

## 2021-11-18 DIAGNOSIS — I5022 Chronic systolic (congestive) heart failure: Secondary | ICD-10-CM

## 2021-11-18 DIAGNOSIS — C3492 Malignant neoplasm of unspecified part of left bronchus or lung: Secondary | ICD-10-CM

## 2021-11-18 DIAGNOSIS — E119 Type 2 diabetes mellitus without complications: Secondary | ICD-10-CM

## 2021-11-18 DIAGNOSIS — J418 Mixed simple and mucopurulent chronic bronchitis: Secondary | ICD-10-CM

## 2021-11-18 NOTE — Chronic Care Management (AMB) (Signed)
Chronic Care Management   CCM RN Visit Note  11/18/2021 Name: John [name] MRN: [id number] DOB: 1950-08-11  Subjective: John [name] is a 71 y.o. year old male who is a primary care patient of Pickard, [name], MD. The care management team was consulted for assistance with disease management and care coordination needs.    Engaged with patient by telephone for follow up visit in response to provider referral for case management and/or care coordination services.   Consent to Services:  The patient was given information about Chronic Care Management services, agreed to services, and gave verbal consent prior to initiation of services.  Please see initial visit note for detailed documentation.   Patient agreed to services and verbal consent obtained.   Assessment: Review of patient past medical history, allergies, medications, health status, including review of consultants reports, laboratory and other test data, was performed as part of comprehensive evaluation and provision of chronic care management services.   SDOH (Social Determinants of Health) assessments and interventions performed:    CCM Care Plan  Allergies  Allergen Reactions   Omega-3 Fatty Acids Hives and Itching   Benazepril Other (See Comments)    hyperkalemia   Fish Allergy Itching    Outpatient Encounter Medications as of 11/18/2021  Medication Sig Note   acetaminophen (TYLENOL) 500 MG tablet Take 500 mg by mouth every 6 (six) hours as needed (pain).    albuterol (VENTOLIN HFA) 108 (90 Base) MCG/ACT inhaler Inhale 2 puffs into the lungs every 6 (six) hours as needed for wheezing or shortness of breath.    amiodarone (PACERONE) 400 MG tablet Take 400 mg by mouth 2 (two) times daily.    apixaban (ELIQUIS) 5 MG TABS tablet Take 1 tablet (5 mg total) by mouth 2 (two) times daily.    clopidogrel (PLAVIX) 75 MG tablet Take 1 tablet (75 mg total) by mouth daily with breakfast.    Fluticasone Furoate (ARNUITY  ELLIPTA) 200 MCG/ACT AEPB Inhale 1 puff into the lungs daily. (Patient taking differently: Inhale 1 puff into the lungs every morning.)    furosemide (LASIX) 40 MG tablet Take 1 tablet (40 mg total) by mouth every other day.    glucose blood (ONETOUCH ULTRA) test strip Use as instructed    insulin aspart (NOVOLOG FLEXPEN) 100 UNIT/ML FlexPen INJECT 5-20 UNITS SUBCUTANEOUSLY WITH LUNCH AND DINNER (Patient taking differently: Inject 2-4 Units into the skin See admin instructions. Inject 2-4 units subcutaneously midday as needed - CBG 180-200 2 units, >200 4 units.)    Insulin Pen Needle (NOVOFINE PEN NEEDLE) 32G X 6 MM MISC 1 each by Does not apply route daily.    JARDIANCE 25 MG TABS tablet TAKE 1 TABLET BY MOUTH EVERY DAY (Patient taking differently: Take 25 mg by mouth at bedtime.)    LEVEMIR FLEXTOUCH 100 UNIT/ML FlexTouch Pen INJECT 46 UNITS INTO THE SKIN DAILY. DX:E11.9 (Patient taking differently: Inject 46 Units into the skin at bedtime. 8pm . Dx:E11.9)    mexiletine (MEXITIL) 150 MG capsule TAKE 2 CAPSULES (300 MG TOTAL) BY MOUTH EVERY 12 (TWELVE) HOURS. (Patient taking differently: Take 150 mg by mouth every 12 (twelve) hours.) 11/05/2021: Pt states MD changed from 2 capsules twice daily to 1 capsule twice daily - unknown date of change   montelukast (SINGULAIR) 10 MG tablet Take 1 tablet (10 mg total) by mouth daily. (Patient taking differently: Take 10 mg by mouth at bedtime.)    nitroGLYCERIN (NITROSTAT) 0.4 MG SL tablet PLACE 1  TABLET (0.4 MG TOTAL) UNDER THE TONGUE EVERY 5 (FIVE) MINUTES AS NEEDED FOR CHEST PAIN.    nystatin cream (MYCOSTATIN) Apply 1 application. topically 3 (three) times daily.    rosuvastatin (CRESTOR) 40 MG tablet Take 1 tablet (40 mg total) by mouth daily. (Patient taking differently: Take 40 mg by mouth every morning.)    sacubitril-valsartan (ENTRESTO) 24-26 MG Take 1 tablet by mouth 2 (two) times daily.    metoprolol succinate (TOPROL-XL) 50 MG 24 hr tablet Take 1  tablet (50 mg total) by mouth daily. Take with or immediately following a meal.    No facility-administered encounter medications on file as of 11/18/2021.    Patient Active Problem List   Diagnosis Date Noted   Hypoglycemia 11/05/2021   Atrial fibrillation, chronic ([place]) 11/05/2021   Goals of care, counseling/discussion    Shortness of breath 10/27/2021   DVT of left axillary vein, acute ([place]) 46/96/2952   Embolic stroke ([place]) 84/13/2440   History of non-ST elevation myocardial infarction (NSTEMI) 04/15/2535   Chronic systolic CHF (congestive heart failure) ([place]) 02/08/2021   VT (ventricular tachycardia) ([place]) 01/26/2021   Coagulation defect ([place]) 12/22/2020   Non-ST elevation (NSTEMI) myocardial infarction ([place])    Capsulitis 05/21/2020   Thoracic aortic aneurysm ([place]) 02/29/2020   Malignant neoplasm of upper lobe of left lung ([place]) 02/19/2020   Former smoker 02/19/2020   Acoustic neuroma ([place]) 09/11/2017   Asymmetrical sensorineural hearing loss 09/11/2017   Anticoagulation adequate 06/06/2016   Hyperlipidemia LDL goal <70 05/20/2015   OSA on CPAP 07/23/2013   Hypertension    Hyperlipidemia    Tobacco abuse 08/18/2012   CAD, multiple prior RCA PCI's. Last cath 2010, Myoview low risk Oct 2012 02/26/2012   DM type 2, goal HbA1c < 7% (HCC) 02/26/2012   HTN (hypertension) 02/26/2012   COPD (chronic obstructive pulmonary disease) ([place]) 02/26/2012   Smoking, quit one week ago 02/26/2012   GERD (gastroesophageal reflux disease) 02/26/2012    Conditions to be addressed/monitored:CHF, COPD, and DMII  Care Plan : RN Care Manager Plan of Care  Updates made by Kassie Mends, RN since 11/18/2021 12:00 AM     Problem: No plan of care established for management of chronic disease states  (DM2, COPD, CHF, HTN, Cancer)   Priority: High     Long-Range Goal: Development of plan of care for chronic disease management   (DM2, COPD, CHF, HTN, Cancer)   Start Date: 05/23/2021  Expected End  Date: 04/19/2022  Priority: High  Note:   Current Barriers:  Knowledge Deficits related to plan of care for management of CHF, COPD, and DMII  Patient reports he lives with spouse, is independent with all aspects of his care, pt drives and has had no issues recently getting to appointments, has a cane that he uses as needed when he leaves home, reports has all medications and taking as prescribed and able to afford, pt reports he has CPAP and had sleep study, is in process of getting a new CPAP and there is 4-5 month waiting list, pt states he is using the same CPAP at present, has inhalers to use for COPD, checks CBG BID with am ranges 95-105 pm ranges 160-170. Reports he does weigh daily with weight today remaining at  194 pounds, has new diagnosis lung cancer few weeks ago and reports "stage 1 and started radiation and has now finished",  and reports while in hospital on 11/05/21, new areas were seen on CT scan and pt to  follow up with Dr. Valeta Harms, reports no advanced directives and in process of completing with assistance at oncology.  Patient reports he got new insoles for her shoes, patient declined palliative care.   Patient reports his appetite is better,  blood pressure has now normalized.  Patient states fatigue has improved but still has low energy.  Patient reports he was in hospital 11/05/21 for hypoglycemia and states he has no further issues. RNCM Clinical Goal(s):  Patient will verbalize understanding of plan for management of COPD and DMII as evidenced by patient report, review EHR and  through collaboration with RN Care manager, provider, and care team.   Interventions: 1:1 collaboration with primary care provider regarding development and update of comprehensive plan of care as evidenced by provider attestation and co-signature Inter-disciplinary care team collaboration (see longitudinal plan of care) Evaluation of current treatment plan related to  self management and patient's adherence  to plan as established by provider   COPD Interventions:  (Status:  New goal.) Long Term Goal Advised patient to track and manage COPD triggers Advised patient to self assesses COPD action plan zone and make appointment with provider if in the yellow zone for 48 hours without improvement Provided education about and advised patient to utilize infection prevention strategies to reduce risk of respiratory infection Discussed the importance of adequate rest and management of fatigue with COPD Reviewed importance of energy conservation   Diabetes Interventions:  (Status:  New goal.) Long Term Goal Assessed patient's understanding of A1c goal: <7% Reviewed medications with patient and discussed importance of medication adherence Review of patient status, including review of consultants reports, relevant laboratory and other test results, and medications completed Reviewed importance of checking CBG as prescribed and keeping a log Reinforced carbohydrate modified diet Reviewed signs/ symptoms hypoglycemia and treatment Reviewed CBG log with patient Lab Results  Component Value Date   HGBA1C 6.9 (H) 04/18/2021   Heart Failure Interventions:  (Status:  New goal. and Goal on track:  Yes.) Long Term Goal Provided education on low sodium diet Discussed importance of daily weight and advised patient to weigh and record daily Discussed the importance of keeping all appointments with provider Reviewed Heart Failure action plan Pain assessment completed   Patient Goals/Self-Care Activities: Take all medications as prescribed Attend all scheduled provider appointments Perform all self care activities independently  Call provider office for new concerns or questions  follow rescue plan if symptoms flare-up track symptoms and what helps feel better or worse check blood sugar at prescribed times: three times daily check feet daily for cuts, sores or redness enter blood sugar readings and  medication or insulin into daily log fill half of plate with vegetables prepare main meal at home 3 to 5 days each week keep feet up while sitting develop a rescue plan follow rescue plan if symptoms flare-up get at least 7 to 8 hours of sleep at night practice relaxation or meditation daily do breathing exercises every day Weigh daily and keep a log Watch daily for swelling in your feet If you gain 3 pounds overnight or 5 pounds in a week, please call your doctor Practice good handwashing, avoid sick people Continue checking blood pressure daily and recording Call RN care manager for any questions at [phone] Follow RULE OF 15 for low blood sugar management:  How to treat low blood sugars (Blood sugar less than 70 mg/dl  Please follow the RULE OF 15 for the treatment of hypoglycemia treatment (When your blood sugars are  less than 70 mg/ dl) STEP  1:  Take 15 grams of carbohydrates when your blood sugar is low, which includes:   3-4 glucose tabs or  3-4 oz of juice or regular soda or  One tube of glucose gel STEP 2:  Recheck blood sugar in 15 minutes STEP 3:  If your blood sugar is still low at the 15 minute recheck ---then, go back to STEP 1 and treat again with another 15 grams of carbohydrates       Plan:Telephone follow up appointment with care management team member scheduled for:  01/20/22  Jacqlyn Larsen [facility], BSN RN Case Manager [facility] [phone]

## 2021-11-18 NOTE — Progress Notes (Signed)
Spoke with patient and advised Dr Harriet Masson recommended he take Furosemide 40 mg daily x 2 weeks and then return to every other day.  He verbalized understanding and will try to comply with recommendations.   Advised will recheck fluid levels on 6/12

## 2021-11-18 NOTE — Patient Instructions (Signed)
Visit Information  Thank you for taking time to visit with me today. Please don't hesitate to contact me if I can be of assistance to you before our next scheduled telephone appointment.  Following are the goals we discussed today:  Take all medications as prescribed Attend all scheduled provider appointments Perform all self care activities independently  Call provider office for new concerns or questions  follow rescue plan if symptoms flare-up track symptoms and what helps feel better or worse check blood sugar at prescribed times: three times daily check feet daily for cuts, sores or redness enter blood sugar readings and medication or insulin into daily log fill half of plate with vegetables prepare main meal at home 3 to 5 days each week keep feet up while sitting develop a rescue plan follow rescue plan if symptoms flare-up get at least 7 to 8 hours of sleep at night practice relaxation or meditation daily do breathing exercises every day Weigh daily and keep a log Watch daily for swelling in your feet If you gain 3 pounds overnight or 5 pounds in a week, please call your doctor Practice good handwashing, avoid sick people Continue checking blood pressure daily and recording Call RN care manager for any questions at [phone] Follow RULE OF 15 for low blood sugar management:  How to treat low blood sugars (Blood sugar less than 70 mg/dl  Please follow the RULE OF 15 for the treatment of hypoglycemia treatment (When your blood sugars are less than 70 mg/ dl) STEP  1:  Take 15 grams of carbohydrates when your blood sugar is low, which includes:   3-4 glucose tabs or  3-4 oz of juice or regular soda or  One tube of glucose gel STEP 2:  Recheck blood sugar in 15 minutes STEP 3:  If your blood sugar is still low at the 15 minute recheck ---then, go back to STEP 1 and treat again with another 15 grams of carbohydrates  Our next appointment is by telephone on 01/20/22 at 130  pm  Please call the care guide team at [phone] if you need to cancel or reschedule your appointment.   If you are experiencing a Mental Health or [place] or need someone to talk to, please call the Suicide and Crisis Lifeline: 988 call the [place] National Suicide Prevention Lifeline: 346 757 7909 or TTY: 320-676-3106 TTY 801-497-7831) to talk to a trained counselor call 1-800-273-TALK (toll free, 24 hour hotline) go to [facility] [address] [place] [phone]) call 911   Patient verbalizes understanding of instructions and care plan provided today and agrees to view in [place]. Active MyChart status and patient understanding of how to access instructions and care plan via MyChart confirmed with patient.     Jacqlyn Larsen RNC, BSN RN Case Manager [facility] [phone]

## 2021-11-18 NOTE — Progress Notes (Signed)
John [name], Kardie, DO  Minahil Quinlivan Panda, RN Please have him take it once daily for the next 2 weeks and that he can go back to every other day.

## 2021-11-21 ENCOUNTER — Other Ambulatory Visit: Payer: Self-pay | Admitting: Cardiology

## 2021-11-22 ENCOUNTER — Telehealth: Payer: Self-pay | Admitting: Family Medicine

## 2021-11-22 NOTE — Telephone Encounter (Signed)
Per Linda's message to me: Larene Beach, we have been trying to get this patient's [facility] scheduled for a while now. [name] told me to change the order if he put it in incorrectly, but I don't have the clearance to correct an order.  The scheduling department needs the order to be changed to a [facility] skull base to thigh not a whole body. I would normally address it next week since they are out this week, but unfortunately I will be out ALL of next week and I know they don't want the patient to wait that long.  Can you help with this?  Thanks Vaughan Basta  Can someone please help change the order to the correct one?

## 2021-11-23 ENCOUNTER — Encounter: Payer: Self-pay | Admitting: Cardiology

## 2021-11-23 ENCOUNTER — Telehealth: Payer: Self-pay | Admitting: Family Medicine

## 2021-11-23 ENCOUNTER — Other Ambulatory Visit: Payer: Self-pay | Admitting: Cardiology

## 2021-11-23 NOTE — Telephone Encounter (Signed)
Patient called in asking if something stronger can be called in for the place on his upper butt. He states that the nystatin cream isn't working that was prescribed and would like to know if there is something stronger.  CB# [phone]

## 2021-11-24 NOTE — Telephone Encounter (Signed)
There is not anything stronger - may need to be seen to make sure itis really yeast being treated

## 2021-11-24 NOTE — Telephone Encounter (Signed)
He is done. It was only a 5 day supply.

## 2021-11-24 NOTE — Telephone Encounter (Signed)
Please advise. Thank you

## 2021-11-25 NOTE — Telephone Encounter (Signed)
Mychart message sent to patient.

## 2021-11-27 ENCOUNTER — Other Ambulatory Visit: Payer: Self-pay | Admitting: Cardiology

## 2021-11-27 ENCOUNTER — Other Ambulatory Visit: Payer: Self-pay | Admitting: Cardiovascular Disease

## 2021-11-27 DIAGNOSIS — I482 Chronic atrial fibrillation, unspecified: Secondary | ICD-10-CM

## 2021-11-28 ENCOUNTER — Other Ambulatory Visit: Payer: Self-pay

## 2021-11-28 ENCOUNTER — Ambulatory Visit (INDEPENDENT_AMBULATORY_CARE_PROVIDER_SITE_OTHER): Payer: Medicare Other

## 2021-11-28 DIAGNOSIS — Z95810 Presence of automatic (implantable) cardiac defibrillator: Secondary | ICD-10-CM

## 2021-11-28 DIAGNOSIS — I5042 Chronic combined systolic (congestive) and diastolic (congestive) heart failure: Secondary | ICD-10-CM

## 2021-11-28 MED ORDER — CLOPIDOGREL BISULFATE 75 MG PO TABS: 75.0000 mg | ORAL_TABLET | Freq: Every day | ORAL | Status: DC

## 2021-11-28 NOTE — Telephone Encounter (Signed)
Prescription refill request for Eliquis received. Indication: Aflutter Last office visit: 10/25/21 ([facility])  Scr: 1.10 (11/06/21) Age: 71 Weight: 83.6kg  Appropriate dose and refill sent to requested pharmacy.

## 2021-11-29 ENCOUNTER — Other Ambulatory Visit: Payer: Medicare Other

## 2021-11-29 ENCOUNTER — Telehealth: Payer: Self-pay

## 2021-11-29 ENCOUNTER — Ambulatory Visit: Payer: Medicare Other | Admitting: Podiatry

## 2021-11-29 NOTE — Telephone Encounter (Addendum)
Spoke with patients wife attempted to contact patient no answer  Message received from Sharman Cheek that has had recent treated VT events  Patient had 9 treated VT episode with ATP on 11/20/21  Patients wife doesn't remember if anything eventful happened on this day   Patients wife stated that patient is very debilitated and she cannot bring him to appointments without the help of her son who can only bring him on Tuesdays and the son is out of town next week. Patients wife stated that patient cannot hardly walk through the house.   Patients wife also stated that she thought patient was supposed to have palliative care consult. When patient was DC from the hospital 11/06/21 Patients wife has been unsuccessful in her attempts to obtain these services informed patients wife that I would attempt to reach out to the case manager Jacqlyn Larsen. LVM with Jacqlyn Larsen

## 2021-11-29 NOTE — Telephone Encounter (Signed)
Pt called in stating the he wanted pcp to know that he has had at least 7 falls since Oct. Pt was asked to come in for an ov, but declined. Pt stated that he has been doing fine, he just wanted the pcp to be aware of his falls. Please advise.  Cb#: [phone]

## 2021-11-29 NOTE — Telephone Encounter (Signed)
Pt checking his blood in the morning fasting  running 85-110 and pm 160 ,  pt hasn't been checking blood every day , however pt did check it while we were on the  phone. It was 116/58 pulse 60. Pt said do to transpiration  issues he wasn't able to get another  until 02/01/22. He said that he hasn't had any more fall .

## 2021-11-29 NOTE — Progress Notes (Signed)
EPIC Encounter for ICM Monitoring  Patient Name: John [name] is a 71 y.o. male Date: 11/29/2021 Primary Care Physican: [name], MD Primary Cardiologist: [place] Electrophysiologist: Curt Bears 11/16/2021 Weight: 184 lbs                                                             Heart Failure questions reviewed.  Pt voices no complaints of fluid accumulation at this time.  Prior to starting Evana Runnels term furosemide prescription he has bilateral swelling of feet but has since resolved.  He has some fatigue and not a lot of stamina at this time. He is working with PCP to apply for palliative care.     Optivol thoracic impedance suggesting possible fluid accumulation is ongoing since 08/29/2021.  Fluid index > normal threshold starting 09/12/2021.  Message sent to [facility] triage 6/13 regarding ATP episodes occurring on 6/4.   Prescribed:  Furosemide 40 mg take 1 tablet(s) (40 mg total) by mouth every other day.  ICM note 5/30, the recommendation was to take Furosemide 40 mg daily x 2 weeks and then return to every other day but per 6/7 my charge message and patient he only needed to take a 5 day prescription.     Labs: 11/06/2021 Creatinine 1.10, BUN 10, Potassium 3.7, Sodium 138, GFR >60 11/05/2021 Creatinine 1.12, BUN 15, Potassium 3.5, Sodium 141, GFR >60  10/18/2021 Creatinine 1.22, BUN 18, Potassium 4.6, Sodium 140  A complete set of results can be found in Results Review.   Recommendations:  Will continue to monitor fluid levels and encouraged to call if experiencing fluid symptoms.  Advised to limit salt intake.     Follow-up plan: ICM clinic phone appointment on 12/13/2021 to recheck fluid levels.   91 day device clinic remote transmission 01/11/2022.     EP/Cardiology Office Visits: Recall 01/04/2022 with Tommye Standard PA.     Copy of ICM check sent to Dr. Curt Bears.  Copy sent to Dr Harriet Masson for review and recommendations if needed.       3 month ICM trend: 11/28/2021.    12-14  Month ICM trend:     Rosalene Billings, RN 11/29/2021 12:58 PM

## 2021-11-29 NOTE — Telephone Encounter (Signed)
This is FYI , did you want see patient

## 2021-11-29 NOTE — Telephone Encounter (Signed)
Spoke with patients wife informed her that if able he would like for patient to come in to the office to be evaluated, apt scheduled with Tommye Standard on 12/13/21 at 1:55pm patients wife agreeable to this apt.

## 2021-11-30 ENCOUNTER — Other Ambulatory Visit: Payer: Self-pay | Admitting: *Deleted

## 2021-11-30 ENCOUNTER — Ambulatory Visit (INDEPENDENT_AMBULATORY_CARE_PROVIDER_SITE_OTHER): Payer: Medicare Other | Admitting: *Deleted

## 2021-11-30 DIAGNOSIS — J418 Mixed simple and mucopurulent chronic bronchitis: Secondary | ICD-10-CM

## 2021-11-30 DIAGNOSIS — I5022 Chronic systolic (congestive) heart failure: Secondary | ICD-10-CM

## 2021-11-30 MED ORDER — CLOPIDOGREL BISULFATE 75 MG PO TABS: 75.0000 mg | ORAL_TABLET | Freq: Every day | ORAL | 2 refills | Status: DC

## 2021-11-30 NOTE — Patient Instructions (Signed)
Visit Information  Thank you for taking time to visit with me today. Please don't hesitate to contact me if I can be of assistance to you before our next scheduled telephone appointment.  Following are the goals we discussed today:  [facility] with [facility] in [place], [place]- be expecting a call from them, their telephone number is [phone].  Our next appointment is by telephone on 01/20/22 at 130 pm  Please call the care guide team at [phone] if you need to cancel or reschedule your appointment.   If you are experiencing a Mental Health or [place] or need someone to talk to, please call the Suicide and Crisis Lifeline: 988 call the [place] National Suicide Prevention Lifeline: (254)395-6135 or TTY: 737-556-3042 TTY 8173709282) to talk to a trained counselor call 1-800-273-TALK (toll free, 24 hour hotline) go to [facility] [address], [place] [phone]) call the [place]: [phone] call 911   Patient verbalizes understanding of instructions and care plan provided today and agrees to view in [place]. Active MyChart status and patient understanding of how to access instructions and care plan via MyChart confirmed with patient.     Jacqlyn Larsen RNC, BSN RN Case Manager [place] [facility] [phone]

## 2021-11-30 NOTE — Chronic Care Management (AMB) (Signed)
Chronic Care Management   CCM RN Visit Note  11/30/2021 Name: John [name] MRN: [id number] DOB: Dec 12, 1950  Subjective: John [name] is a 71 y.o. year old male who is a primary care patient of Pickard, [name], MD. The care management team was consulted for assistance with disease management and care coordination needs.    Engaged with patient by telephone for follow up visit in response to provider referral for case management and/or care coordination services.   Consent to Services:  The patient was given information about Chronic Care Management services, agreed to services, and gave verbal consent prior to initiation of services.  Please see initial visit note for detailed documentation.   Patient agreed to services and verbal consent obtained.   Assessment: Review of patient past medical history, allergies, medications, health status, including review of consultants reports, laboratory and other test data, was performed as part of comprehensive evaluation and provision of chronic care management services.   SDOH (Social Determinants of Health) assessments and interventions performed:    CCM Care Plan  Allergies  Allergen Reactions   Omega-3 Fatty Acids Hives and Itching   Benazepril Other (See Comments)    hyperkalemia   Fish Allergy Itching    Outpatient Encounter Medications as of 11/30/2021  Medication Sig Note   acetaminophen (TYLENOL) 500 MG tablet Take 500 mg by mouth every 6 (six) hours as needed (pain).    albuterol (VENTOLIN HFA) 108 (90 Base) MCG/ACT inhaler Inhale 2 puffs into the lungs every 6 (six) hours as needed for wheezing or shortness of breath.    amiodarone (PACERONE) 400 MG tablet Take 400 mg by mouth 2 (two) times daily.    clopidogrel (PLAVIX) 75 MG tablet Take 1 tablet (75 mg total) by mouth daily with breakfast.    ELIQUIS 5 MG TABS tablet TAKE 1 TABLET BY MOUTH TWICE A DAY    Fluticasone Furoate (ARNUITY ELLIPTA) 200 MCG/ACT AEPB Inhale 1  puff into the lungs daily. (Patient taking differently: Inhale 1 puff into the lungs every morning.)    furosemide (LASIX) 40 MG tablet TAKE 1 TABLET BY MOUTH EVERY OTHER DAY    glucose blood (ONETOUCH ULTRA) test strip Use as instructed    insulin aspart (NOVOLOG FLEXPEN) 100 UNIT/ML FlexPen INJECT 5-20 UNITS SUBCUTANEOUSLY WITH LUNCH AND DINNER (Patient taking differently: Inject 2-4 Units into the skin See admin instructions. Inject 2-4 units subcutaneously midday as needed - CBG 180-200 2 units, >200 4 units.)    Insulin Pen Needle (NOVOFINE PEN NEEDLE) 32G X 6 MM MISC 1 each by Does not apply route daily.    JARDIANCE 25 MG TABS tablet TAKE 1 TABLET BY MOUTH EVERY DAY (Patient taking differently: Take 25 mg by mouth at bedtime.)    LEVEMIR FLEXTOUCH 100 UNIT/ML FlexTouch Pen INJECT 46 UNITS INTO THE SKIN DAILY. DX:E11.9 (Patient taking differently: Inject 46 Units into the skin at bedtime. 8pm . Dx:E11.9)    metoprolol succinate (TOPROL-XL) 50 MG 24 hr tablet TAKE 1 TABLET BY MOUTH DAILY. TAKE WITH OR IMMEDIATELY FOLLOWING A MEAL.    mexiletine (MEXITIL) 150 MG capsule TAKE 2 CAPSULES (300 MG TOTAL) BY MOUTH EVERY 12 (TWELVE) HOURS. (Patient taking differently: Take 150 mg by mouth every 12 (twelve) hours.) 11/05/2021: Pt states MD changed from 2 capsules twice daily to 1 capsule twice daily - unknown date of change   montelukast (SINGULAIR) 10 MG tablet Take 1 tablet (10 mg total) by mouth daily. (Patient taking differently: Take 10  mg by mouth at bedtime.)    nitroGLYCERIN (NITROSTAT) 0.4 MG SL tablet PLACE 1 TABLET (0.4 MG TOTAL) UNDER THE TONGUE EVERY 5 (FIVE) MINUTES AS NEEDED FOR CHEST PAIN.    nystatin cream (MYCOSTATIN) Apply 1 application. topically 3 (three) times daily.    rosuvastatin (CRESTOR) 40 MG tablet TAKE 1 TABLET BY MOUTH EVERY DAY    sacubitril-valsartan (ENTRESTO) 24-26 MG Take 1 tablet by mouth 2 (two) times daily.    No facility-administered encounter medications on file as  of 11/30/2021.    Patient Active Problem List   Diagnosis Date Noted   Hypoglycemia 11/05/2021   Atrial fibrillation, chronic ([place]) 11/05/2021   Goals of care, counseling/discussion    Shortness of breath 10/27/2021   DVT of left axillary vein, acute ([place]) 38/03/1750   Embolic stroke ([place]) 02/58/5277   History of non-ST elevation myocardial infarction (NSTEMI) 82/42/3536   Chronic systolic CHF (congestive heart failure) ([place]) 02/08/2021   VT (ventricular tachycardia) ([place]) 01/26/2021   Coagulation defect ([place]) 12/22/2020   Non-ST elevation (NSTEMI) myocardial infarction ([place])    Capsulitis 05/21/2020   Thoracic aortic aneurysm ([place]) 02/29/2020   Malignant neoplasm of upper lobe of left lung ([place]) 02/19/2020   Former smoker 02/19/2020   Acoustic neuroma ([place]) 09/11/2017   Asymmetrical sensorineural hearing loss 09/11/2017   Anticoagulation adequate 06/06/2016   Hyperlipidemia LDL goal <70 05/20/2015   OSA on CPAP 07/23/2013   Hypertension    Hyperlipidemia    Tobacco abuse 08/18/2012   CAD, multiple prior RCA PCI's. Last cath 2010, Myoview low risk Oct 2012 02/26/2012   DM type 2, goal HbA1c < 7% (HCC) 02/26/2012   HTN (hypertension) 02/26/2012   COPD (chronic obstructive pulmonary disease) ([place]) 02/26/2012   Smoking, quit one week ago 02/26/2012   GERD (gastroesophageal reflux disease) 02/26/2012    Conditions to be addressed/monitored:CHF, HTN, COPD, DMII, and Cancer  Care Plan : RN Care Manager Plan of Care  Updates made by Kassie Mends, RN since 11/30/2021 12:00 AM     Problem: No plan of care established for management of chronic disease states  (DM2, COPD, CHF, HTN, Cancer)   Priority: High     Long-Range Goal: Development of plan of care for chronic disease management   (DM2, COPD, CHF, HTN, Cancer)   Start Date: 05/23/2021  Expected End Date: 04/19/2022  Priority: High  Note:   Current Barriers:  Knowledge Deficits related to plan of care for management of  CHF, COPD, and DMII  Patient reports he lives with spouse, is independent with all aspects of his care, pt drives and has had no issues recently getting to appointments, has a cane that he uses as needed when he leaves home, reports has all medications and taking as prescribed and able to afford, pt reports he has CPAP and had sleep study, is in process of getting a new CPAP and there is 4-5 month waiting list, pt states he is using the same CPAP at present, has inhalers to use for COPD, checks CBG BID with am ranges 95-105 pm ranges 160-170. Reports he does weigh daily with weight today remaining at  194 pounds, has new diagnosis lung cancer few weeks ago and reports "stage 1 and started radiation and has now finished",  and reports while in hospital on 11/05/21, new areas were seen on CT scan and pt to follow up with Dr. Valeta Harms, reports no advanced directives and in process of completing with assistance at oncology.  Patient reports  he got new insoles for her shoes, patient declined palliative care.   Patient reports his appetite is better,  blood pressure has now normalized.  Patient states fatigue has improved but still has low energy.  Patient reports he was in hospital 11/05/21 for hypoglycemia and states he has no further issues. 11/30/21- Update- RN care manager received message stating patient and wife are ready for Palliative Care, spoke with patient, verified patient does want Palliative Care and lives in [place].  Patient continues to have weakness, tiredness. RNCM Clinical Goal(s):  Patient will verbalize understanding of plan for management of COPD and DMII as evidenced by patient report, review EHR and  through collaboration with RN Care manager, provider, and care team.   Interventions: 1:1 collaboration with primary care provider regarding development and update of comprehensive plan of care as evidenced by provider attestation and co-signature Inter-disciplinary care team  collaboration (see longitudinal plan of care) Evaluation of current treatment plan related to  self management and patient's adherence to plan as established by provider   COPD Interventions:  (Status:  New goal.) Long Term Goal Advised patient to track and manage COPD triggers Advised patient to self assesses COPD action plan zone and make appointment with provider if in the yellow zone for 48 hours without improvement Provided education about and advised patient to utilize infection prevention strategies to reduce risk of respiratory infection Discussed the importance of adequate rest and management of fatigue with COPD [facility] call [facility] in [place], [place] (previously Palliative Care) spoke with Pia Mau and referral given, also sent Community message in basket with patient name and demographics for referral. In basket note sent to primary care provider with update of Palliative Care referral placed   Diabetes Interventions:  (Status:  New goal.) Long Term Goal Assessed patient's understanding of A1c goal: <7% Reviewed medications with patient and discussed importance of medication adherence Review of patient status, including review of consultants reports, relevant laboratory and other test results, and medications completed Reviewed importance of checking CBG as prescribed and keeping a log Reinforced carbohydrate modified diet Reviewed signs/ symptoms hypoglycemia and treatment Reviewed CBG log with patient Lab Results  Component Value Date   HGBA1C 6.9 (H) 04/18/2021   Heart Failure Interventions:  (Status:  New goal. and Goal on track:  Yes.) Long Term Goal Provided education on low sodium diet Discussed importance of daily weight and advised patient to weigh and record daily Discussed the importance of keeping all appointments with provider Reviewed Heart Failure action plan Pain assessment completed Reinforced energy conservation   Patient  Goals/Self-Care Activities: Take all medications as prescribed Attend all scheduled provider appointments Perform all self care activities independently  Call provider office for new concerns or questions  follow rescue plan if symptoms flare-up track symptoms and what helps feel better or worse check blood sugar at prescribed times: three times daily check feet daily for cuts, sores or redness enter blood sugar readings and medication or insulin into daily log fill half of plate with vegetables prepare main meal at home 3 to 5 days each week keep feet up while sitting develop a rescue plan follow rescue plan if symptoms flare-up get at least 7 to 8 hours of sleep at night practice relaxation or meditation daily do breathing exercises every day Weigh daily and keep a log Watch daily for swelling in your feet If you gain 3 pounds overnight or 5 pounds in a week, please call your doctor Practice  good handwashing, avoid sick people Continue checking blood pressure daily and recording Call RN care manager for any questions at [phone] Follow RULE OF 15 for low blood sugar management:  How to treat low blood sugars (Blood sugar less than 70 mg/dl  Please follow the RULE OF 15 for the treatment of hypoglycemia treatment (When your blood sugars are less than 70 mg/ dl) STEP  1:  Take 15 grams of carbohydrates when your blood sugar is low, which includes:   3-4 glucose tabs or  3-4 oz of juice or regular soda or  One tube of glucose gel STEP 2:  Recheck blood sugar in 15 minutes STEP 3:  If your blood sugar is still low at the 15 minute recheck ---then, go back to STEP 1 and treat again with another 15 grams of carbohydrates Referral completed with [facility] in [place], [place]- be expecting a call from them, their telephone number is [phone].       Plan:Telephone follow up appointment with care management team member scheduled for:  01/20/22  John [name] [facility], BSN RN  Case Manager [facility] [phone]

## 2021-12-02 ENCOUNTER — Other Ambulatory Visit: Payer: Self-pay | Admitting: Family Medicine

## 2021-12-02 NOTE — Telephone Encounter (Signed)
Seen 2 weeks ago . Requested Prescriptions  Pending Prescriptions Disp Refills  . JARDIANCE 25 MG TABS tablet [Pharmacy Med Name: JARDIANCE 25 MG TABLET] 30 tablet 0    Sig: TAKE 1 TABLET BY MOUTH EVERY DAY     Endocrinology:  Diabetes - SGLT2 Inhibitors Failed - 12/02/2021  9:06 AM      Failed - HBA1C is between 0 and 7.9 and within 180 days    Hgb A1c MFr Bld  Date Value Ref Range Status  04/18/2021 6.9 (H) 4.8 - 5.6 % Final    Comment:    (NOTE) Pre diabetes:          5.7%-6.4%  Diabetes:              >6.4%  Glycemic control for   <7.0% adults with diabetes          Passed - Cr in normal range and within 360 days    Creat  Date Value Ref Range Status  09/15/2019 0.96 0.70 - 1.18 mg/dL Final    Comment:    For patients >42 years of age, the reference limit for Creatinine is approximately 13% higher for people identified as African-American. .    Creatinine, Ser  Date Value Ref Range Status  11/06/2021 1.10 0.61 - 1.24 mg/dL Final         Passed - eGFR in normal range and within 360 days    GFR, Est African American  Date Value Ref Range Status  09/15/2019 92 > OR = 60 mL/min/1.33m Final   GFR calc Af Amer  Date Value Ref Range Status  02/28/2020 >60 >60 mL/min Final   GFR, Est Non African American  Date Value Ref Range Status  09/15/2019 80 > OR = 60 mL/min/1.763mFinal   GFR, Estimated  Date Value Ref Range Status  11/06/2021 >60 >60 mL/min Final    Comment:    (NOTE) Calculated using the CKD-EPI Creatinine Equation (2021)    eGFR  Date Value Ref Range Status  10/18/2021 63 >59 mL/min/1.73 Final         Passed - Valid encounter within last 6 months    Recent Outpatient Visits          2 weeks ago DM type 2, goal HbA1c < 7% ([facility]  Br[facility]y FrizzleMD   6 months ago Arm DVT (deep venous thromboembolism), acute, left ([facility]  Br[facility]Susy FrizzleMD   7 months ago DVT of left axillary  vein, acute ([facility]  [facility]ica A, NP   9 months ago ASCVD (arteriosclerotic cardiovascular disease)   Br[facility]ammie McgeeMD   1 year ago Right arm pain   [facility], Wa[place]ammie McgeeMD      Future Appointments            In 1 month Icard, Br[facility]avio GravesDO [facility]

## 2021-12-06 ENCOUNTER — Ambulatory Visit: Payer: Medicare Other | Admitting: Nurse Practitioner

## 2021-12-08 ENCOUNTER — Telehealth: Payer: Self-pay

## 2021-12-08 NOTE — Telephone Encounter (Signed)
John [name] of [place] [phone]  Pt called requesting for their palliative at home program along w/socical worker and RN   Drenda Freeze was call for orders, however, per Drenda Freeze looks like [facility] has picked up the pt. So per Drenda Freeze, she has closed the case.

## 2021-12-13 ENCOUNTER — Ambulatory Visit (INDEPENDENT_AMBULATORY_CARE_PROVIDER_SITE_OTHER): Payer: Medicare Other | Admitting: Physician Assistant

## 2021-12-13 ENCOUNTER — Encounter: Payer: Self-pay | Admitting: Physician Assistant

## 2021-12-13 ENCOUNTER — Telehealth: Payer: Self-pay

## 2021-12-13 ENCOUNTER — Ambulatory Visit (INDEPENDENT_AMBULATORY_CARE_PROVIDER_SITE_OTHER): Payer: Medicare Other

## 2021-12-13 VITALS — BP 100/60 | HR 70 | Ht 72.0 in | Wt 185.6 lb

## 2021-12-13 DIAGNOSIS — I255 Ischemic cardiomyopathy: Secondary | ICD-10-CM

## 2021-12-13 DIAGNOSIS — I472 Ventricular tachycardia, unspecified: Secondary | ICD-10-CM | POA: Diagnosis not present

## 2021-12-13 DIAGNOSIS — I5023 Acute on chronic systolic (congestive) heart failure: Secondary | ICD-10-CM | POA: Diagnosis not present

## 2021-12-13 DIAGNOSIS — I2510 Atherosclerotic heart disease of native coronary artery without angina pectoris: Secondary | ICD-10-CM | POA: Diagnosis not present

## 2021-12-13 DIAGNOSIS — I4892 Unspecified atrial flutter: Secondary | ICD-10-CM

## 2021-12-13 DIAGNOSIS — Z95810 Presence of automatic (implantable) cardiac defibrillator: Secondary | ICD-10-CM | POA: Diagnosis not present

## 2021-12-13 DIAGNOSIS — Z79899 Other long term (current) drug therapy: Secondary | ICD-10-CM | POA: Diagnosis not present

## 2021-12-13 DIAGNOSIS — I5022 Chronic systolic (congestive) heart failure: Secondary | ICD-10-CM | POA: Diagnosis not present

## 2021-12-13 DIAGNOSIS — I5042 Chronic combined systolic (congestive) and diastolic (congestive) heart failure: Secondary | ICD-10-CM

## 2021-12-13 LAB — CUP PACEART INCLINIC DEVICE CHECK
Battery Remaining Longevity: 99 mo
Battery Voltage: 2.99 V
Brady Statistic AP VP Percent: 0.14 %
Brady Statistic AP VS Percent: 93.85 %
Brady Statistic AS VP Percent: 0 %
Brady Statistic AS VS Percent: 6.01 %
Brady Statistic RA Percent Paced: 93.98 %
Brady Statistic RV Percent Paced: 0.16 %
Date Time Interrogation Session: 20230627174512
HighPow Impedance: 67 Ohm
Implantable Lead Implant Date: 20220304
Implantable Lead Implant Date: 20220304
Implantable Lead Location: 753859
Implantable Lead Location: 753860
Implantable Lead Model: 5076
Implantable Pulse Generator Implant Date: 20220304
Lead Channel Impedance Value: 323 Ohm
Lead Channel Impedance Value: 399 Ohm
Lead Channel Impedance Value: 437 Ohm
Lead Channel Pacing Threshold Amplitude: 0.625 V
Lead Channel Pacing Threshold Amplitude: 1.375 V
Lead Channel Pacing Threshold Pulse Width: 0.4 ms
Lead Channel Pacing Threshold Pulse Width: 0.4 ms
Lead Channel Sensing Intrinsic Amplitude: 14.625 mV
Lead Channel Sensing Intrinsic Amplitude: 17.875 mV
Lead Channel Sensing Intrinsic Amplitude: 2.125 mV
Lead Channel Sensing Intrinsic Amplitude: 2.625 mV
Lead Channel Setting Pacing Amplitude: 1.5 V
Lead Channel Setting Pacing Amplitude: 2.75 V
Lead Channel Setting Pacing Pulse Width: 0.4 ms
Lead Channel Setting Sensing Sensitivity: 0.3 mV

## 2021-12-13 NOTE — Progress Notes (Signed)
Spoke with patient and heart failure questions reviewed.  Pt was seen in the office today by Francis Dowse, PA.  She ordered labs today and depending on results she may start him on fluid pill and stop Entresto.  He will wait to hear from the office for further instructions.  He stated he is getting weaker and now using a wheelchair.  He is having multiple falls at home.  Palliative care has a home visit scheduled for 7/26.  Advised will check lab results and if any further instructions regarding medications.  Will change next ICM follow up based on if patient starts fluid pill.

## 2021-12-13 NOTE — Telephone Encounter (Signed)
Remote ICM transmission received.  Attempted call to patient regarding ICM remote transmission and no message left. 

## 2021-12-14 ENCOUNTER — Other Ambulatory Visit: Payer: Self-pay

## 2021-12-14 ENCOUNTER — Telehealth: Payer: Self-pay

## 2021-12-14 DIAGNOSIS — I5042 Chronic combined systolic (congestive) and diastolic (congestive) heart failure: Secondary | ICD-10-CM

## 2021-12-14 LAB — BASIC METABOLIC PANEL
BUN/Creatinine Ratio: 14 (ref 10–24)
BUN: 15 mg/dL (ref 8–27)
CO2: 22 mmol/L (ref 20–29)
Calcium: 8.5 mg/dL — ABNORMAL LOW (ref 8.6–10.2)
Chloride: 104 mmol/L (ref 96–106)
Creatinine, Ser: 1.07 mg/dL (ref 0.76–1.27)
Glucose: 112 mg/dL — ABNORMAL HIGH (ref 70–99)
Potassium: 3.6 mmol/L (ref 3.5–5.2)
Sodium: 143 mmol/L (ref 134–144)
eGFR: 73 mL/min/{1.73_m2} (ref >59)

## 2021-12-14 LAB — PRO B NATRIURETIC PEPTIDE: NT-Pro BNP: 2463 pg/mL — ABNORMAL HIGH (ref 0–376)

## 2021-12-14 MED ORDER — POTASSIUM CHLORIDE CRYS ER 10 MEQ PO TBCR: 10.0000 meq | EXTENDED_RELEASE_TABLET | Freq: Every day | ORAL | 3 refills | Status: DC

## 2021-12-14 MED ORDER — FUROSEMIDE 40 MG PO TABS: 40.0000 mg | ORAL_TABLET | Freq: Every day | ORAL | 3 refills | Status: DC

## 2021-12-14 NOTE — Telephone Encounter (Signed)
Pt had labs drawn yesterday. I called this morning and added on TSH and LFT. [facility] will fax [place] either results or a letter stating they didn't have enough sample.

## 2021-12-15 LAB — HEPATIC FUNCTION PANEL
ALT: 28 IU/L (ref 0–44)
AST: 32 IU/L (ref 0–40)
Albumin: 2.6 g/dL — ABNORMAL LOW (ref 3.7–4.7)
Alkaline Phosphatase: 548 IU/L — ABNORMAL HIGH (ref 44–121)
Bilirubin Total: 0.3 mg/dL (ref 0.0–1.2)
Bilirubin, Direct: 0.14 mg/dL (ref 0.00–0.40)
Total Protein: 5.5 g/dL — ABNORMAL LOW (ref 6.0–8.5)

## 2021-12-15 LAB — SPECIMEN STATUS REPORT

## 2021-12-15 LAB — TSH: TSH: 1.5 u[IU]/mL (ref 0.450–4.500)

## 2021-12-15 NOTE — Telephone Encounter (Signed)
Spoke with pt and went over instructions and upcoming appts. He wrote them down this time.

## 2021-12-15 NOTE — Telephone Encounter (Signed)
Pt calling back requesting to speak to April Garrison, CMA again in regards to labs, appt and medication management that he spoke to her about. He says he wasn't in a place to write things down yesterday and wasn't feeling well, and could not find information on MyChart. Requesting call back.

## 2021-12-16 DIAGNOSIS — Z7984 Long term (current) use of oral hypoglycemic drugs: Secondary | ICD-10-CM

## 2021-12-16 DIAGNOSIS — I509 Heart failure, unspecified: Secondary | ICD-10-CM | POA: Diagnosis not present

## 2021-12-16 DIAGNOSIS — E1159 Type 2 diabetes mellitus with other circulatory complications: Secondary | ICD-10-CM | POA: Diagnosis not present

## 2021-12-16 DIAGNOSIS — J449 Chronic obstructive pulmonary disease, unspecified: Secondary | ICD-10-CM | POA: Diagnosis not present

## 2021-12-21 ENCOUNTER — Telehealth: Payer: Self-pay

## 2021-12-21 ENCOUNTER — Other Ambulatory Visit: Payer: Medicare Other | Admitting: *Deleted

## 2021-12-21 DIAGNOSIS — I5042 Chronic combined systolic (congestive) and diastolic (congestive) heart failure: Secondary | ICD-10-CM | POA: Diagnosis not present

## 2021-12-21 NOTE — Telephone Encounter (Signed)
Alert for VT episodes since last seen 12/13/21. 20 treated VT episodes, 3 monitored VT episodes and 80 NSVT detected. On Amiodarone, Mexilitine, Metoprolol and Eliquis.  Treated VT were successful with 1-3 ATP therapies applied. No shocks. Average rates during these events were 114-130 bpm. Optivol elevated, not a new finding. Sent to triage. LKV  Unsuccessful telephone encounter with patient to assess for s/s of VT with ATP, NSVT and medications compliance. Recent in clinic appointment with Jens Som, PA. Lasix and potassium added for volume overload. Return appointment 12/28/21. [employer] compliant VM message left requesting call back to [phone].

## 2021-12-21 NOTE — Telephone Encounter (Signed)
Patient returning call. States he is unaware of VT with ATP and NSVT episodes. He admits to "feeling bad" with generalized weakness. Confirmed patient has reduced his Amio to 200mg  BID per new order and advisement of Dr. Quentin Ore 12/13/21 (see R. Charlcie Cradle, PA note). Patient continues to take eliquis, toprol xl, and mexiletine as prescribed. Of note patient also prescribed lasix 40 mg daily and potassium 10 meq daily 12/14/21. Patient states his first dose of lasix made him dehydrated and dizzy. Reports he lost 7 lbs in 1 day. He has only taken 1 dose since then with the same results. He is not planning on continuing this medication although volume overloaded on ICD transmission today. He has however continued to take potassium 10 meq daily. BMET obtained this morning as previously scheduled and awaiting results. Routing to R. Charlcie Cradle as patient has appointment 12/28/21 and to Dr. Curt Bears for advisement.

## 2021-12-22 ENCOUNTER — Other Ambulatory Visit: Payer: Self-pay

## 2021-12-22 ENCOUNTER — Emergency Department (HOSPITAL_COMMUNITY): Payer: Medicare Other

## 2021-12-22 ENCOUNTER — Inpatient Hospital Stay (HOSPITAL_COMMUNITY)
Admission: EM | Admit: 2021-12-22 | Discharge: 2021-12-24 | DRG: 641 | Disposition: A | Discharge status: Skilled Nursing Facility | Payer: Medicare Other | Attending: Internal Medicine | Admitting: Internal Medicine

## 2021-12-22 ENCOUNTER — Encounter (HOSPITAL_COMMUNITY): Payer: Self-pay

## 2021-12-22 ENCOUNTER — Telehealth: Payer: Self-pay

## 2021-12-22 DIAGNOSIS — C3412 Malignant neoplasm of upper lobe, left bronchus or lung: Secondary | ICD-10-CM | POA: Diagnosis present

## 2021-12-22 DIAGNOSIS — Z743 Need for continuous supervision: Secondary | ICD-10-CM | POA: Diagnosis not present

## 2021-12-22 DIAGNOSIS — J449 Chronic obstructive pulmonary disease, unspecified: Secondary | ICD-10-CM | POA: Diagnosis not present

## 2021-12-22 DIAGNOSIS — E876 Hypokalemia: Secondary | ICD-10-CM | POA: Diagnosis not present

## 2021-12-22 DIAGNOSIS — Z794 Long term (current) use of insulin: Secondary | ICD-10-CM

## 2021-12-22 DIAGNOSIS — E119 Type 2 diabetes mellitus without complications: Secondary | ICD-10-CM | POA: Diagnosis present

## 2021-12-22 DIAGNOSIS — Z808 Family history of malignant neoplasm of other organs or systems: Secondary | ICD-10-CM

## 2021-12-22 DIAGNOSIS — M6281 Muscle weakness (generalized): Secondary | ICD-10-CM | POA: Diagnosis not present

## 2021-12-22 DIAGNOSIS — I2510 Atherosclerotic heart disease of native coronary artery without angina pectoris: Secondary | ICD-10-CM | POA: Diagnosis present

## 2021-12-22 DIAGNOSIS — I110 Hypertensive heart disease with heart failure: Secondary | ICD-10-CM | POA: Diagnosis present

## 2021-12-22 DIAGNOSIS — R531 Weakness: Secondary | ICD-10-CM

## 2021-12-22 DIAGNOSIS — G4733 Obstructive sleep apnea (adult) (pediatric): Secondary | ICD-10-CM | POA: Diagnosis not present

## 2021-12-22 DIAGNOSIS — L89152 Pressure ulcer of sacral region, stage 2: Secondary | ICD-10-CM | POA: Diagnosis present

## 2021-12-22 DIAGNOSIS — I255 Ischemic cardiomyopathy: Secondary | ICD-10-CM | POA: Diagnosis present

## 2021-12-22 DIAGNOSIS — G4730 Sleep apnea, unspecified: Secondary | ICD-10-CM | POA: Diagnosis present

## 2021-12-22 DIAGNOSIS — R278 Other lack of coordination: Secondary | ICD-10-CM | POA: Diagnosis not present

## 2021-12-22 DIAGNOSIS — H9191 Unspecified hearing loss, right ear: Secondary | ICD-10-CM | POA: Diagnosis present

## 2021-12-22 DIAGNOSIS — N179 Acute kidney failure, unspecified: Secondary | ICD-10-CM | POA: Diagnosis present

## 2021-12-22 DIAGNOSIS — R5383 Other fatigue: Secondary | ICD-10-CM

## 2021-12-22 DIAGNOSIS — Z66 Do not resuscitate: Secondary | ICD-10-CM | POA: Diagnosis present

## 2021-12-22 DIAGNOSIS — I4892 Unspecified atrial flutter: Secondary | ICD-10-CM | POA: Diagnosis present

## 2021-12-22 DIAGNOSIS — R9431 Abnormal electrocardiogram [ECG] [EKG]: Secondary | ICD-10-CM

## 2021-12-22 DIAGNOSIS — Z8249 Family history of ischemic heart disease and other diseases of the circulatory system: Secondary | ICD-10-CM

## 2021-12-22 DIAGNOSIS — Z955 Presence of coronary angioplasty implant and graft: Secondary | ICD-10-CM

## 2021-12-22 DIAGNOSIS — R296 Repeated falls: Secondary | ICD-10-CM | POA: Diagnosis not present

## 2021-12-22 DIAGNOSIS — I472 Ventricular tachycardia, unspecified: Secondary | ICD-10-CM | POA: Diagnosis present

## 2021-12-22 DIAGNOSIS — Z741 Need for assistance with personal care: Secondary | ICD-10-CM | POA: Diagnosis not present

## 2021-12-22 DIAGNOSIS — Z888 Allergy status to other drugs, medicaments and biological substances status: Secondary | ICD-10-CM

## 2021-12-22 DIAGNOSIS — I482 Chronic atrial fibrillation, unspecified: Secondary | ICD-10-CM | POA: Diagnosis present

## 2021-12-22 DIAGNOSIS — R000 Tachycardia, unspecified: Secondary | ICD-10-CM | POA: Diagnosis not present

## 2021-12-22 DIAGNOSIS — H547 Unspecified visual loss: Secondary | ICD-10-CM | POA: Diagnosis present

## 2021-12-22 DIAGNOSIS — R002 Palpitations: Secondary | ICD-10-CM | POA: Diagnosis not present

## 2021-12-22 DIAGNOSIS — L8990 Pressure ulcer of unspecified site, unspecified stage: Secondary | ICD-10-CM | POA: Insufficient documentation

## 2021-12-22 DIAGNOSIS — E8809 Other disorders of plasma-protein metabolism, not elsewhere classified: Secondary | ICD-10-CM | POA: Diagnosis present

## 2021-12-22 DIAGNOSIS — I5022 Chronic systolic (congestive) heart failure: Secondary | ICD-10-CM | POA: Diagnosis present

## 2021-12-22 DIAGNOSIS — I252 Old myocardial infarction: Secondary | ICD-10-CM | POA: Diagnosis not present

## 2021-12-22 DIAGNOSIS — Z950 Presence of cardiac pacemaker: Secondary | ICD-10-CM | POA: Diagnosis not present

## 2021-12-22 DIAGNOSIS — I69398 Other sequelae of cerebral infarction: Secondary | ICD-10-CM | POA: Diagnosis not present

## 2021-12-22 DIAGNOSIS — R262 Difficulty in walking, not elsewhere classified: Secondary | ICD-10-CM | POA: Diagnosis not present

## 2021-12-22 DIAGNOSIS — Z79899 Other long term (current) drug therapy: Secondary | ICD-10-CM

## 2021-12-22 DIAGNOSIS — I499 Cardiac arrhythmia, unspecified: Secondary | ICD-10-CM | POA: Diagnosis not present

## 2021-12-22 DIAGNOSIS — Z89422 Acquired absence of other left toe(s): Secondary | ICD-10-CM

## 2021-12-22 DIAGNOSIS — E782 Mixed hyperlipidemia: Secondary | ICD-10-CM | POA: Diagnosis not present

## 2021-12-22 DIAGNOSIS — R52 Pain, unspecified: Secondary | ICD-10-CM | POA: Diagnosis not present

## 2021-12-22 DIAGNOSIS — Z9181 History of falling: Secondary | ICD-10-CM | POA: Diagnosis not present

## 2021-12-22 DIAGNOSIS — R0689 Other abnormalities of breathing: Secondary | ICD-10-CM | POA: Diagnosis not present

## 2021-12-22 DIAGNOSIS — I480 Paroxysmal atrial fibrillation: Secondary | ICD-10-CM | POA: Diagnosis not present

## 2021-12-22 DIAGNOSIS — Z86718 Personal history of other venous thrombosis and embolism: Secondary | ICD-10-CM

## 2021-12-22 DIAGNOSIS — Z87891 Personal history of nicotine dependence: Secondary | ICD-10-CM

## 2021-12-22 DIAGNOSIS — Z7902 Long term (current) use of antithrombotics/antiplatelets: Secondary | ICD-10-CM

## 2021-12-22 DIAGNOSIS — E1143 Type 2 diabetes mellitus with diabetic autonomic (poly)neuropathy: Secondary | ICD-10-CM

## 2021-12-22 DIAGNOSIS — Z7901 Long term (current) use of anticoagulants: Secondary | ICD-10-CM

## 2021-12-22 LAB — BASIC METABOLIC PANEL
Anion gap: 7 (ref 5–15)
BUN/Creatinine Ratio: 21 (ref 10–24)
BUN: 28 mg/dL — ABNORMAL HIGH (ref 8–27)
BUN: 28 mg/dL — ABNORMAL HIGH (ref 8–23)
CO2: 24 mmol/L (ref 20–29)
CO2: 28 mmol/L (ref 22–32)
Calcium: 8.1 mg/dL — ABNORMAL LOW (ref 8.6–10.2)
Calcium: 8.3 mg/dL — ABNORMAL LOW (ref 8.9–10.3)
Chloride: 103 mmol/L (ref 96–106)
Chloride: 105 mmol/L (ref 98–111)
Creatinine, Ser: 1.35 mg/dL — ABNORMAL HIGH (ref 0.76–1.27)
Creatinine, Ser: 1.47 mg/dL — ABNORMAL HIGH (ref 0.61–1.24)
GFR, Estimated: 50 mL/min — ABNORMAL LOW (ref >60)
Glucose, Bld: 93 mg/dL (ref 70–99)
Glucose: 241 mg/dL — ABNORMAL HIGH (ref 70–99)
Potassium: 3.1 mmol/L — ABNORMAL LOW (ref 3.5–5.2)
Potassium: 2.7 mmol/L — CL (ref 3.5–5.1)
Sodium: 140 mmol/L (ref 134–144)
Sodium: 140 mmol/L (ref 135–145)
eGFR: 55 mL/min/{1.73_m2} — ABNORMAL LOW (ref >59)

## 2021-12-22 LAB — CBC WITH DIFFERENTIAL/PLATELET
Abs Immature Granulocytes: 0.08 10*3/uL — ABNORMAL HIGH (ref 0.00–0.07)
Basophils Absolute: 0 10*3/uL (ref 0.0–0.1)
Basophils Relative: 0 %
Eosinophils Absolute: 0 10*3/uL (ref 0.0–0.5)
Eosinophils Relative: 0 %
HCT: 32.5 % — ABNORMAL LOW (ref 39.0–52.0)
Hemoglobin: 9.7 g/dL — ABNORMAL LOW (ref 13.0–17.0)
Immature Granulocytes: 1 %
Lymphocytes Relative: 9 %
Lymphs Abs: 0.7 10*3/uL (ref 0.7–4.0)
MCH: 24.5 pg — ABNORMAL LOW (ref 26.0–34.0)
MCHC: 29.8 g/dL — ABNORMAL LOW (ref 30.0–36.0)
MCV: 82.1 fL (ref 80.0–100.0)
Monocytes Absolute: 0.6 10*3/uL (ref 0.1–1.0)
Monocytes Relative: 7 %
Neutro Abs: 6.4 10*3/uL (ref 1.7–7.7)
Neutrophils Relative %: 83 %
Platelets: 349 10*3/uL (ref 150–400)
RBC: 3.96 MIL/uL — ABNORMAL LOW (ref 4.22–5.81)
RDW: 16.7 % — ABNORMAL HIGH (ref 11.5–15.5)
WBC: 7.8 10*3/uL (ref 4.0–10.5)
nRBC: 0 % (ref 0.0–0.2)

## 2021-12-22 LAB — MAGNESIUM: Magnesium: 2.1 mg/dL (ref 1.7–2.4)

## 2021-12-22 LAB — BRAIN NATRIURETIC PEPTIDE: B Natriuretic Peptide: 355 pg/mL — ABNORMAL HIGH (ref 0.0–100.0)

## 2021-12-22 MED ORDER — INSULIN ASPART 100 UNIT/ML IJ SOLN
0.0000 [IU] | Freq: Three times a day (TID) | INTRAMUSCULAR | Status: DC
  Administered 2021-12-23 – 2021-12-24 (×2): 1 [IU] via SUBCUTANEOUS

## 2021-12-22 MED ORDER — ACETAMINOPHEN 325 MG PO TABS
650.0000 mg | ORAL_TABLET | Freq: Four times a day (QID) | ORAL | Status: DC | PRN
  Administered 2021-12-23: 650 mg via ORAL
  Filled 2021-12-22: qty 2

## 2021-12-22 MED ORDER — ACETAMINOPHEN 650 MG RE SUPP: 650.0000 mg | Freq: Four times a day (QID) | RECTAL | Status: DC | PRN

## 2021-12-22 MED ORDER — POTASSIUM CHLORIDE 10 MEQ/100ML IV SOLN
10.0000 meq | INTRAVENOUS | Status: AC
  Administered 2021-12-22 (×3): 10 meq via INTRAVENOUS
  Filled 2021-12-22 (×3): qty 100

## 2021-12-22 MED ORDER — POTASSIUM CHLORIDE CRYS ER 20 MEQ PO TBCR
40.0000 meq | EXTENDED_RELEASE_TABLET | Freq: Once | ORAL | Status: AC
  Administered 2021-12-22: 40 meq via ORAL
  Filled 2021-12-22: qty 2

## 2021-12-22 NOTE — ED Notes (Signed)
Patient verbalized MSE 

## 2021-12-22 NOTE — Telephone Encounter (Signed)
-----   Message from [place], [place] sent at 12/22/2021  1:34 PM EDT ----- Potassium is low, please have him take 64meq BID today (4 tabs) only   Tomorrow please restart lasix 75meq every other day and increase the Kdur 25meq on days he takes the lasix  Daily weights  Need to repeat his BMET on Monday please  Keep his appt with me Please verify his dose of mexiletine

## 2021-12-22 NOTE — H&P (Addendum)
History and Physical    Patient: John [name] YQM:[id number] DOB: December 27, 1950 DOA: 12/22/2021 DOS: the patient was seen and examined on 12/22/2021 PCP: [name], MD  Patient coming from: Home  Chief Complaint:  Chief Complaint  Patient presents with   Pacemaker Problem   HPI: John [name] is a 71 y.o. 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  Patient states that he was contacted today by his cardiologist's office due to low potassium noted in blood work done about 2 days ago.  Patient complained of lightheadedness and frequent falls which has been going on recently, he also complained of weakness and fatigue during the phone call, he was then asked to go to the ED for further evaluation and management.  He denies fever, chest pain, shortness of breath, headache, nausea  ED Course:  In the emergency department, he was intermittently tachypneic, but all other vital signs were within normal range.  Work-up in the ED showed normocytic anemia.  BMP showed hypokalemia, BUN/creatinine 28/1.47 (baseline creatinine at 1.1-1.2), BNP 355 (this was 282.9 about 1 month ago), magnesium 2.1. Chest x-ray showed no acute findings.  Stable left perihilar opacity from prior.  Left-sided pacemaker in place.  Unchanged mild cardiomegaly Potassium was replenished, hospitalist was asked to admit patient for further evaluation and management.  Review of Systems: Review of systems as noted in the HPI. All other systems reviewed and are negative.   Past Medical History:  Diagnosis Date   Arm DVT (deep venous thromboembolism), acute, left (HCC)    while off eliquis for bronchoscopy 2022   Atrial flutter (HCC)    s/p cardioversion    Coronary artery disease    Diabetes mellitus    GERD (gastroesophageal reflux disease)    History of nuclear stress test 04/04/2011   lexiscan; mod-large in size fixed inferolateral defect (scar); non-diagnostic for ischemia; low risk scan    Hyperlipidemia    Hypertension    Ischemic cardiomyopathy    Left foot drop    r/t past disk srugery - uses Kevlar brace   Lung cancer ([place]) 04/26/2021   Myocardial infarction [facility])    posterior MI   Osteomyelitis ([place])    s/p left 2nd toe amputation in 01/2021   Pulmonary nodule    Recurrent ventricular tachycardia (HCC)    Shortness of breath    Sleep apnea    on CPAP; 04/28/2007 split-night - AHI during total sleep 44.43/hr and REM 72.56/hr   Past Surgical History:  Procedure Laterality Date   AMPUTATION TOE Left 02/10/2021   Procedure: AMPUTATION  LEFT SECOND TOE;  Surgeon: Edrick Kins, DPM;  Location: [place];  Service: Podiatry;  Laterality: Left;   [place]   BRONCHIAL BIOPSY  04/26/2021   Procedure: BRONCHIAL BIOPSIES;  Surgeon: Garner Nash, DO;  Location: [place] [facility];  Service: Pulmonary;;   BRONCHIAL BRUSHINGS  04/26/2021   Procedure: BRONCHIAL BRUSHINGS;  Surgeon: Garner Nash, DO;  Location: [place] [facility];  Service: Pulmonary;;   BRONCHIAL NEEDLE ASPIRATION BIOPSY  04/26/2021   Procedure: BRONCHIAL NEEDLE ASPIRATION BIOPSIES;  Surgeon: Garner Nash, DO;  Location: [place] [facility];  Service: Pulmonary;;   CARDIAC CATHETERIZATION  2010   6 stents total   CARDIAC  CATHETERIZATION  01/2000   percutaneous transluminal coronary balloon angioplasty of mid RCA stenotic lesion   CARDIAC CATHETERIZATION  06/2006   no stenting; ischemic cardiomyopathy, EF 40-45%   CARDIOVERSION N/A 07/28/2016   Procedure: CARDIOVERSION;  Surgeon: Troy Sine, MD;  Location: [place];  Service: Cardiovascular;  Laterality: N/A;   CORONARY ANGIOPLASTY  09/1998   mid-distal RCA balloon dilatation, 4.5 & 5.0 stents    CORONARY  ANGIOPLASTY WITH STENT PLACEMENT  03/1994   angioplasty & stenting (non-DES) of circumflex/prox ramus intermedius   CORONARY ANGIOPLASTY WITH STENT PLACEMENT  10/1994   large iliac [id number] stent to RCA   [place]  12/2002   4.54mm stents x2 of RCA   CORONARY ANGIOPLASTY WITH STENT PLACEMENT  01/2005   cutting balloon arthrectomy of distal RCA & Cypher DES 3.5x13; cutting balloon arthrectomy of mid RCA with Cypher DES 3.5x18   CORONARY ANGIOPLASTY WITH STENT PLACEMENT  11/2008   stenting of mid RCA with 4.0x62mm driver, non-DES   CORONARY BALLOON ANGIOPLASTY N/A 02/15/2021   Procedure: CORONARY BALLOON ANGIOPLASTY;  Surgeon: Troy Sine, MD;  Location: [place] [facility];  Service: Cardiovascular;  Laterality: N/A;   FIDUCIAL MARKER PLACEMENT  04/26/2021   Procedure: FIDUCIAL MARKER PLACEMENT;  Surgeon: Garner Nash, DO;  Location: [place] [facility];  Service: Pulmonary;;   ICD IMPLANT N/A 08/20/2020   Procedure: ICD IMPLANT;  Surgeon: Constance Haw, MD;  Location: [place] [facility];  Service: Cardiovascular;  Laterality: N/A;   INTRAVASCULAR PRESSURE WIRE/FFR STUDY N/A 03/02/2020   Procedure: INTRAVASCULAR PRESSURE WIRE/FFR STUDY;  Surgeon: Leonie Man, MD;  Location: [place] [facility];  Service: Cardiovascular;  Laterality: N/A;   LEFT HEART CATH AND CORONARY ANGIOGRAPHY N/A 03/02/2020   Procedure: LEFT HEART CATH AND CORONARY ANGIOGRAPHY;  Surgeon: Leonie Man, MD;  Location: [place] [facility];  Service: Cardiovascular;  Laterality: N/A;   LEFT HEART CATH AND CORONARY ANGIOGRAPHY N/A 08/19/2020   Procedure: LEFT HEART CATH AND CORONARY ANGIOGRAPHY;  Surgeon: Lorretta Harp, MD;  Location: [place] [facility];  Service: Cardiovascular;  Laterality: N/A;   LEFT HEART CATH AND CORONARY ANGIOGRAPHY N/A 02/15/2021   Procedure: LEFT HEART CATH AND CORONARY ANGIOGRAPHY;  Surgeon: Troy Sine, MD;  Location: [place] [facility];  Service: Cardiovascular;   Laterality: N/A;   LEFT HEART CATHETERIZATION WITH CORONARY ANGIOGRAM N/A 02/27/2012   Procedure: LEFT HEART CATHETERIZATION WITH CORONARY ANGIOGRAM;  Surgeon: Lorretta Harp, MD;  [facility];  Service: Cardiovascular;  Laterality: N/A;   TRANSTHORACIC ECHOCARDIOGRAM  07/29/2010   EF 50=55%, mod inf wall hypokinesis & mild post wall hypokinesis; LA mild-mod dilated; mild mitral annular calcif & mild MR; mild TR & elevated RV systolic pressure; AV mildly sclerotic; mild aortic root dilatation    V TACH ABLATION N/A 01/20/2021   Procedure: V TACH ABLATION;  Surgeon: Vickie Epley, MD;  Location: [place] [facility];  Service: Cardiovascular;  Laterality: N/A;   VIDEO BRONCHOSCOPY WITH ENDOBRONCHIAL NAVIGATION Left 04/26/2021   Procedure: VIDEO BRONCHOSCOPY WITH ENDOBRONCHIAL NAVIGATION;  Surgeon: Garner Nash, DO;  Location: [place];  Service: Pulmonary;  Laterality: Left;  ION w/ fiducial   VIDEO BRONCHOSCOPY WITH RADIAL ENDOBRONCHIAL ULTRASOUND  04/26/2021   Procedure: RADIAL ENDOBRONCHIAL ULTRASOUND;  Surgeon: Garner Nash, DO;  Location: [place] [facility];  Service: Pulmonary;;    Social History:  reports that he quit smoking about 5 years ago. His smoking use  included cigarettes. He has a 50.00 pack-year smoking history. He has never used smokeless tobacco. He reports that he does not currently use alcohol. He reports that he does not use drugs.   Allergies  Allergen Reactions   Omega-3 Fatty Acids Hives and Itching   Benazepril Other (See Comments)    hyperkalemia   Fish Allergy Itching    Family History  Problem Relation Age of Onset   Bone cancer Mother    Heart attack Father      Prior to Admission medications   Medication Sig Start Date End Date Taking? Authorizing Provider  acetaminophen (TYLENOL) 500 MG tablet Take 500 mg by mouth every 6 (six) hours as needed (pain).   Yes [provider]  albuterol (VENTOLIN HFA) 108 (90 Base) MCG/ACT inhaler  Inhale 2 puffs into the lungs every 6 (six) hours as needed for wheezing or shortness of breath. 06/14/20  Yes Icard, Octavio Graves, DO  amiodarone (PACERONE) 400 MG tablet Take 200 mg by mouth 2 (two) times daily. 11/03/21  Yes [provider]  ELIQUIS 5 MG TABS tablet TAKE 1 TABLET BY MOUTH TWICE A DAY 11/28/21  Yes Troy Sine, MD  Fluticasone Furoate (ARNUITY ELLIPTA) 200 MCG/ACT AEPB Inhale 1 puff into the lungs daily. 12/29/20  Yes Icard, Bradley L, DO  furosemide (LASIX) 40 MG tablet Take 1 tablet (40 mg total) by mouth daily. 12/14/21  Yes Baldwin Jamaica, PA-C  insulin aspart (NOVOLOG FLEXPEN) 100 UNIT/ML FlexPen INJECT 5-20 UNITS SUBCUTANEOUSLY WITH LUNCH AND DINNER Patient taking differently: See admin instructions. INJECT 5-20 UNITS SUBCUTANEOUSLY WITH LUNCH AND DINNER as needed for low blood sugar 06/16/20  Yes [name], MD  JARDIANCE 25 MG TABS tablet TAKE 1 TABLET BY MOUTH EVERY DAY 12/02/21  Yes Pickard, [name], MD  LEVEMIR FLEXTOUCH 100 UNIT/ML FlexTouch Pen INJECT 46 UNITS INTO THE SKIN DAILY. DX:E11.9 Patient taking differently: 10-20 Units daily. . Dx:E11.9 09/29/21  Yes [name], MD  metoprolol succinate (TOPROL-XL) 50 MG 24 hr tablet TAKE 1 TABLET BY MOUTH DAILY. TAKE WITH OR IMMEDIATELY FOLLOWING A MEAL. 11/28/21  Yes Troy Sine, MD  mexiletine (MEXITIL) 150 MG capsule Take 2 capsules by mouth at bedtime   Yes [provider]  montelukast (SINGULAIR) 10 MG tablet Take 1 tablet (10 mg total) by mouth daily. 08/10/21  Yes [name], MD  nitroGLYCERIN (NITROSTAT) 0.4 MG SL tablet PLACE 1 TABLET (0.4 MG TOTAL) UNDER THE TONGUE EVERY 5 (FIVE) MINUTES AS NEEDED FOR CHEST PAIN. 07/27/20  Yes Troy Sine, MD  potassium chloride (KLOR-CON M) 10 MEQ tablet Take 1 tablet (10 mEq total) by mouth daily. 12/14/21  Yes Baldwin Jamaica, PA-C  rosuvastatin (CRESTOR) 40 MG tablet TAKE 1 TABLET BY MOUTH EVERY DAY 11/28/21  Yes Troy Sine, MD   clopidogrel (PLAVIX) 75 MG tablet Take 1 tablet (75 mg total) by mouth daily. Patient not taking: Reported on 12/22/2021 11/30/21   Tobb, Godfrey Pick, DO  glucose blood (ONETOUCH ULTRA) test strip Use as instructed 10/27/21   [name], MD  Insulin Pen Needle (NOVOFINE PEN NEEDLE) 32G X 6 MM MISC 1 each by Does not apply route daily. 08/26/21   [name], MD  mexiletine (MEXITIL) 150 MG capsule TAKE 2 CAPSULES (300 MG TOTAL) BY MOUTH EVERY 12 (TWELVE) HOURS. 09/05/21   Camnitz, Ocie Doyne, MD  nystatin cream (MYCOSTATIN) Apply 1 application. topically 3 (three) times daily. Patient not taking: Reported  on 12/22/2021 11/15/21   [name], MD    Physical Exam: BP 119/80 (BP Location: Left Arm)   Pulse 72   Temp 98.2 F (36.8 C) (Oral)   Resp 20   Ht 6' (1.829 m) Comment: Simultaneous filing. User may not have seen previous data.  Wt 78.7 kg Comment: Simultaneous filing. User may not have seen previous data.  SpO2 100%   BMI 23.53 kg/m   General: 71 y.o. year-old male well developed well nourished in no acute distress.  Alert and oriented x3. HEENT: NCAT, EOMI Neck: Supple, trachea medial Cardiovascular: Regular rate and rhythm with no rubs or gallops.  No thyromegaly or JVD noted.  No lower extremity edema. 2/4 pulses in all 4 extremities. Respiratory: Clear to auscultation with no wheezes or rales. Good inspiratory effort. Abdomen: Soft, nontender nondistended with normal bowel sounds x4 quadrants. Muskuloskeletal: No cyanosis, clubbing or edema noted bilaterally Neuro: Focal neurologic deficit, sensation, reflexes intact Skin: No ulcerative lesions noted or rashes Psychiatry: Mood is appropriate for condition and setting          Labs on Admission:  Basic Metabolic Panel: Recent Labs  Lab 12/21/21 1139 12/22/21 1722  NA 140 140  K 3.1* 2.7*  CL 103 105  CO2 24 28  GLUCOSE 241* 93  BUN 28* 28*  CREATININE 1.35* 1.47*  CALCIUM 8.1* 8.3*  MG  --  2.1    Liver Function Tests: No results for input(s): "AST", "ALT", "ALKPHOS", "BILITOT", "PROT", "ALBUMIN" in the last 168 hours. No results for input(s): "LIPASE", "AMYLASE" in the last 168 hours. No results for input(s): "AMMONIA" in the last 168 hours. CBC: Recent Labs  Lab 12/22/21 1722  WBC 7.8  NEUTROABS 6.4  HGB 9.7*  HCT 32.5*  MCV 82.1  PLT 349   Cardiac Enzymes: No results for input(s): "CKTOTAL", "CKMB", "CKMBINDEX", "TROPONINI" in the last 168 hours.  BNP (last 3 results) Recent Labs    01/17/21 2233 11/05/21 0507 12/22/21 1722  BNP 169.7* 280.9* 355.0*    ProBNP (last 3 results) Recent Labs    06/30/21 0908 12/13/21 1450  PROBNP 1,179* 2,463*    CBG: No results for input(s): "GLUCAP" in the last 168 hours.  Radiological Exams on Admission: DG Chest Port 1 View  Result Date: 12/22/2021 CLINICAL DATA:  Palpitations.  Pacemaker fired. EXAM: PORTABLE CHEST 1 VIEW COMPARISON:  Radiographs and CT 11/05/2021. FINDINGS: Dual lead left-sided pacemaker in place, lead tips projecting over the right atrium and ventricle. Pacemaker wires are intact. Stable mild cardiomegaly. Stable left perihilar opacity with a fiducial marker clip. No acute airspace disease. No features of pulmonary edema. Chronic elevation of left hemidiaphragm. No pleural effusion or pneumothorax. Stable osseous structures. IMPRESSION: 1. No acute findings.  Stable left perihilar opacity from prior. 2. Left-sided pacemaker in place.  Unchanged mild cardiomegaly. Electronically Signed   By: Keith Rake M.D.   On: 12/22/2021 17:08    EKG: I independently viewed the EKG done and my findings are as followed: Atrial paced rhythm, QTc 588 ms  Assessment/Plan Present on Admission:  Hypokalemia  COPD (chronic obstructive pulmonary disease) ([place])  Atrial fibrillation, chronic (HCC)  CAD, multiple prior RCA PCI's. Last cath 2010, Myoview low risk Oct 6659  Chronic systolic CHF (congestive heart failure)  (HCC)  Principal Problem:   Hypokalemia Active Problems:   COPD (chronic obstructive pulmonary disease) (HCC)   Type 2 diabetes mellitus (HCC)   CAD, multiple prior RCA PCI's. Last cath 2010, Myoview  low risk Oct 2012   OSA (obstructive sleep apnea)   Mixed hyperlipidemia   Chronic systolic CHF (congestive heart failure) (HCC)   Atrial fibrillation, chronic (HCC)   Generalized weakness   Fatigue   Recurrent falls   Prolonged QT interval  Hypokalemia K+ is 2.7 K+ will be replenished Please monitor for AM K+ for further replenishmemnt  Generalized weakness and fatigue Recurrent falls Continue fall precaution neurochecks Continue PT/OT eval and treat  Prolonged QT interval Qtc 588, but patient has a pacemaker Avoid QT prolonging drugs Magnesium 2.1 Continue telemetry  Chronically elevated BNP 355 (this was 282.9 about 1 month ago) Continue total input/output, daily weights and fluid restriction Continue Cardiac diet   Chronic systolic CHF Echocardiogram done on on 04/18/2021 showed LVEF of 25 to 30%.  LV severely decreased function.  RWMA.  LV diastolic parameters are indeterminate.  Elevated LVEDP.  Severe hypokinesis of LV, basal mid inferoseptal wall, lateral wall.  Akinesis of the LV, basal mid inferior wall and inferolateral wall. Continue Crestor Lasix and Toprol temporarily held due to soft BP  Acute kidney injury BUN/creatinine 28/1.47 (baseline creatinine at 1.1-1.2) Renally adjust medications, avoid nephrotoxic agents/dehydration/hypotension  Chronic atrial fibrillation/flutter Continue Eliquis Continue amiodarone  Type II DM Continue ISS and hypoglycemic protocol  COPD Continue albuterol  Mixed hyperlipidemia Continue Crestor  CAD s/p stent placement Continue Eliquis, Crestor  Goals of care: Palliative care will be consulted  DVT prophylaxis: This   Advance Care Planning:   Code Status: DNR   Consults: None  Family Communication: None at  bedside  Severity of Illness: The appropriate patient status for this patient is OBSERVATION. Observation status is judged to be reasonable and necessary in order to provide the required intensity of service to ensure the patient's safety. The patient's presenting symptoms, physical exam findings, and initial radiographic and laboratory data in the context of their medical condition is felt to place them at decreased risk for further clinical deterioration. Furthermore, it is anticipated that the patient will be medically stable for discharge from the hospital within 2 midnights of admission.   Author: Bernadette Hoit, DO 12/22/2021 11:56 PM  For on call review [email or web address].

## 2021-12-22 NOTE — ED Triage Notes (Signed)
Patient states he was called by his provider for stating his pacemaker misfired this morning at 0100 and around 2:30 today and was told to come to ED for irregularities. Patient denies pain at the current moment and states he feels like his pacer is fluttering.

## 2021-12-22 NOTE — Telephone Encounter (Signed)
Successful telephone encounter to patient and wife to discuss Dr. Curt Bears R. Charlcie Cradle, PA recommendations as noted below. While on the phone, patient states he is not doing well today. Extremely lightheaded and just fell to the floor. States neighbor had to come get him up. Denies injury. Admits to extreme weakness and dizziness which precipitated fall and NOT loss of consciousness. Recent history of VT with ATP therapy on device interrogation (see telephone note 12/21/21). Spoke with wife who admits to fear and extreme worry over patients rapid decline in health. Advised wife to call 911 for transport to [facility] ED as patient needs evaluation and electrolyte replacement. Patient/wife was not advised of new orders in light of presenting complaints and in need of 911 assistance.    Potassium is low, please have him take 87meq BID today (4 tabs) only  Tomorrow please restart lasix 56meq every other day and increase the Kdur 65meq on days he takes the lasix Daily weights Need to repeat his BMET on Monday please Keep his appt with me Please verify his dose of mexiletine

## 2021-12-22 NOTE — ED Provider Notes (Signed)
[facility] EMERGENCY DEPARTMENT Provider Note   CSN: [id number] Arrival date & time: 12/22/21  1509     History  Chief Complaint  Patient presents with   Pacemaker Problem    John [name] is a 71 y.o. male.  HPI Patient came to the ED because he was instructed to by his cardiologist.  He was contacted today because the potassium was low when it was checked 2 days ago.  During the telephone contact the patient informed the nurse that he was lightheaded and fell.  Patient's wife is concerned that he has been declining recently.  Therefore the patient was transferred.  He was told that his potassium was low and he is to take 40 mEq today, twice.  He was supposed to restart Lasix 20 mg q. OD and take a dose of potassium 40 mEq with it.    Home Medications Prior to Admission medications   Medication Sig Start Date End Date Taking? Authorizing Provider  acetaminophen (TYLENOL) 500 MG tablet Take 500 mg by mouth every 6 (six) hours as needed (pain).   Yes [provider]  albuterol (VENTOLIN HFA) 108 (90 Base) MCG/ACT inhaler Inhale 2 puffs into the lungs every 6 (six) hours as needed for wheezing or shortness of breath. 06/14/20  Yes Icard, Octavio Graves, DO  amiodarone (PACERONE) 400 MG tablet Take 200 mg by mouth 2 (two) times daily. 11/03/21  Yes [provider]  ELIQUIS 5 MG TABS tablet TAKE 1 TABLET BY MOUTH TWICE A DAY 11/28/21  Yes Troy Sine, MD  Fluticasone Furoate (ARNUITY ELLIPTA) 200 MCG/ACT AEPB Inhale 1 puff into the lungs daily. 12/29/20  Yes Icard, Bradley L, DO  furosemide (LASIX) 40 MG tablet Take 1 tablet (40 mg total) by mouth daily. 12/14/21  Yes Baldwin Jamaica, PA-C  insulin aspart (NOVOLOG FLEXPEN) 100 UNIT/ML FlexPen INJECT 5-20 UNITS SUBCUTANEOUSLY WITH LUNCH AND DINNER Patient taking differently: See admin instructions. INJECT 5-20 UNITS SUBCUTANEOUSLY WITH LUNCH AND DINNER as needed for low blood sugar 06/16/20  Yes [name], MD   JARDIANCE 25 MG TABS tablet TAKE 1 TABLET BY MOUTH EVERY DAY 12/02/21  Yes Pickard, [name], MD  LEVEMIR FLEXTOUCH 100 UNIT/ML FlexTouch Pen INJECT 46 UNITS INTO THE SKIN DAILY. DX:E11.9 Patient taking differently: 10-20 Units daily. . Dx:E11.9 09/29/21  Yes [name], MD  metoprolol succinate (TOPROL-XL) 50 MG 24 hr tablet TAKE 1 TABLET BY MOUTH DAILY. TAKE WITH OR IMMEDIATELY FOLLOWING A MEAL. 11/28/21  Yes Troy Sine, MD  mexiletine (MEXITIL) 150 MG capsule Take 2 capsules by mouth at bedtime   Yes [provider]  montelukast (SINGULAIR) 10 MG tablet Take 1 tablet (10 mg total) by mouth daily. 08/10/21  Yes [name], MD  nitroGLYCERIN (NITROSTAT) 0.4 MG SL tablet PLACE 1 TABLET (0.4 MG TOTAL) UNDER THE TONGUE EVERY 5 (FIVE) MINUTES AS NEEDED FOR CHEST PAIN. 07/27/20  Yes Troy Sine, MD  potassium chloride (KLOR-CON M) 10 MEQ tablet Take 1 tablet (10 mEq total) by mouth daily. 12/14/21  Yes Baldwin Jamaica, PA-C  rosuvastatin (CRESTOR) 40 MG tablet TAKE 1 TABLET BY MOUTH EVERY DAY 11/28/21  Yes Troy Sine, MD  clopidogrel (PLAVIX) 75 MG tablet Take 1 tablet (75 mg total) by mouth daily. Patient not taking: Reported on 12/22/2021 11/30/21   Berniece Salines, DO  glucose blood (ONETOUCH ULTRA) test strip Use as instructed 10/27/21   [name], MD  Insulin Pen Needle (  NOVOFINE PEN NEEDLE) 32G X 6 MM MISC 1 each by Does not apply route daily. 08/26/21   [name], MD  mexiletine (MEXITIL) 150 MG capsule TAKE 2 CAPSULES (300 MG TOTAL) BY MOUTH EVERY 12 (TWELVE) HOURS. 09/05/21   Camnitz, Ocie Doyne, MD  nystatin cream (MYCOSTATIN) Apply 1 application. topically 3 (three) times daily. Patient not taking: Reported on 12/22/2021 11/15/21   [name], MD      Allergies    Omega-3 fatty acids, Benazepril, and Fish allergy    Review of Systems   Review of Systems  Physical Exam Updated Vital Signs BP 108/70   Pulse 69   Temp 99 F (37.2 C) (Oral)    Resp 18   Ht 6' (1.829 m)   Wt 79.8 kg   SpO2 98%   BMI 23.86 kg/m  Physical Exam Vitals and nursing note reviewed.  Constitutional:      Appearance: He is well-developed. He is not ill-appearing.  HENT:     Head: Normocephalic and atraumatic.     Right Ear: External ear normal.     Left Ear: External ear normal.  Eyes:     Conjunctiva/sclera: Conjunctivae normal.     Comments: Right eye covered with patch  Neck:     Trachea: Phonation normal.  Cardiovascular:     Rate and Rhythm: Normal rate and regular rhythm.     Heart sounds: Normal heart sounds.  Pulmonary:     Effort: Pulmonary effort is normal. No respiratory distress.     Breath sounds: Normal breath sounds. No stridor.  Abdominal:     General: There is no distension.     Palpations: Abdomen is soft.     Tenderness: There is no abdominal tenderness.  Musculoskeletal:        General: Normal range of motion.     Cervical back: Normal range of motion and neck supple.     Right lower leg: No edema.     Left lower leg: No edema.  Skin:    General: Skin is warm and dry.     Coloration: Skin is not jaundiced.  Neurological:     Mental Status: He is alert and oriented to person, place, and time.     Cranial Nerves: No cranial nerve deficit.     Sensory: No sensory deficit.     Motor: No abnormal muscle tone.     Coordination: Coordination normal.  Psychiatric:        Mood and Affect: Mood normal.        Behavior: Behavior normal.        Thought Content: Thought content normal.        Judgment: Judgment normal.     ED Results / Procedures / Treatments   Labs (all labs ordered are listed, but only abnormal results are displayed) Labs Reviewed  BASIC METABOLIC PANEL - Abnormal; Notable for the following components:      Result Value   Potassium 2.7 (*)    BUN 28 (*)    Creatinine, Ser 1.47 (*)    Calcium 8.3 (*)    GFR, Estimated 50 (*)    All other components within normal limits  CBC WITH  DIFFERENTIAL/PLATELET - Abnormal; Notable for the following components:   RBC 3.96 (*)    Hemoglobin 9.7 (*)    HCT 32.5 (*)    MCH 24.5 (*)    MCHC 29.8 (*)    RDW 16.7 (*)    Abs Immature  Granulocytes 0.08 (*)    All other components within normal limits  BRAIN NATRIURETIC PEPTIDE - Abnormal; Notable for the following components:   B Natriuretic Peptide 355.0 (*)    All other components within normal limits  MAGNESIUM    EKG EKG Interpretation  Date/Time:  Thursday December 22 2021 15:47:25 EDT Ventricular Rate:  70 PR Interval:  126 QRS Duration: 131 QT Interval:  544 QTC Calculation: 588 R Axis:   16 Text Interpretation: Probable atrial paced rhythm Nonspecific intraventricular conduction delay Inferior infarct, old Abnormal lateral Q waves Since last tracing of earlier today No significant change was found Confirmed by Daleen Bo [phone]) on 12/22/2021 3:52:17 PM   EKG Interpretation  Date/Time:  Thursday December 22 2021 16:26:38 EDT Ventricular Rate:  70 PR Interval:  121 QRS Duration: 129 QT Interval:  548 QTC Calculation: 592 R Axis:   14 Text Interpretation: Atrial-paced rhythm Nonspecific intraventricular conduction delay Inferior infarct, old Abnormal lateral Q waves Since last tracing of earlier today now pacer spikes are evident Otherwise no significant change Confirmed by Daleen Bo [phone]) on 12/22/2021 4:30:11 PM         Radiology DG Chest Port 1 View  Result Date: 12/22/2021 CLINICAL DATA:  Palpitations.  Pacemaker fired. EXAM: PORTABLE CHEST 1 VIEW COMPARISON:  Radiographs and CT 11/05/2021. FINDINGS: Dual lead left-sided pacemaker in place, lead tips projecting over the right atrium and ventricle. Pacemaker wires are intact. Stable mild cardiomegaly. Stable left perihilar opacity with a fiducial marker clip. No acute airspace disease. No features of pulmonary edema. Chronic elevation of left hemidiaphragm. No pleural effusion or pneumothorax. Stable osseous  structures. IMPRESSION: 1. No acute findings.  Stable left perihilar opacity from prior. 2. Left-sided pacemaker in place.  Unchanged mild cardiomegaly. Electronically Signed   By: Keith Rake M.D.   On: 12/22/2021 17:08    Procedures Procedures    Medications Ordered in ED Medications  potassium chloride 10 mEq in 100 mL IVPB (10 mEq Intravenous New Bag/Given 12/22/21 1856)  potassium chloride SA (KLOR-CON M) CR tablet 40 mEq (40 mEq Oral Given 12/22/21 1856)    ED Course/ Medical Decision Making/ A&P Clinical Course as of 12/22/21 1958  Thu Dec 22, 2021  1953 [employer] pacemaker evaluation was done: He has had 25 episodes of ventricular tachycardia since December 13, 2021.  Last 1 was at 115 this morning, consistent with the time when he felt palpitations and lasted 1 minute 4 seconds.  His ventricular tachycardia episodes have been treated with pacing, successfully to convert to normal rhythm.  His pacemaker was determined to be functioning normally. [EW]    Clinical Course User Index [EW] Daleen Bo, MD                           Medical Decision Making Patient has had fluttering sensation in his chest for couple of days but no sensation of defibrillator firing.  His potassium has been low, and he was instructed to take more of it today.  Amount and/or Complexity of Data Reviewed External Data Reviewed: labs, radiology, ECG and notes.    Details: Last saw his cardiologist on 12/13/2021.  He has a complex cardiac history currently paroxysmal atrial fibrillation, coronary artery disease, episodes of V. tach, placement of defibrillator pacemaker, treatment with antiarrhythmic drugs, and anticoagulation with Eliquis.  He has had multiple prior shocks for V. tach. Labs: ordered.    Details: CBC, metabolic panel Radiology: ordered.  Details: Cardiac monitor-atrial pacing Discussion of management or test interpretation with external provider(s): Consultation hospitalist to admit  patient.  Risk Prescription drug management. Risk Details: Patient with palpitations, found to be hypokalemic, and having multiple episodes of ventricular tachycardia.  His pacemaker is working to pace him out of these rhythms into the normal rates.  Patient is sensing these episodes as palpitation.  He has not had a defibrillator firing.  His blood pressure is normal.  He requires ongoing management for hypokalemia and have requested hospitalist to see him for that.  Doubt ACS, PE or pneumonia.  Critical Care Total time providing critical care: 45 minutes           Final Clinical Impression(s) / ED Diagnoses Final diagnoses:  Hypokalemia  Ventricular tachycardia [facility])    Rx / DC Orders ED Discharge Orders     None         Daleen Bo, MD 12/22/21 2048

## 2021-12-22 NOTE — ED Notes (Signed)
Date and time results received: 12/22/21 6:22 PM   Test: potassium Critical Value: 2.7  Name of Provider Notified:   Orders Received? Or Actions Taken?: see orders.

## 2021-12-23 DIAGNOSIS — I110 Hypertensive heart disease with heart failure: Secondary | ICD-10-CM | POA: Diagnosis present

## 2021-12-23 DIAGNOSIS — I255 Ischemic cardiomyopathy: Secondary | ICD-10-CM | POA: Diagnosis present

## 2021-12-23 DIAGNOSIS — E119 Type 2 diabetes mellitus without complications: Secondary | ICD-10-CM | POA: Diagnosis present

## 2021-12-23 DIAGNOSIS — M6281 Muscle weakness (generalized): Secondary | ICD-10-CM | POA: Diagnosis not present

## 2021-12-23 DIAGNOSIS — E782 Mixed hyperlipidemia: Secondary | ICD-10-CM | POA: Diagnosis present

## 2021-12-23 DIAGNOSIS — I69398 Other sequelae of cerebral infarction: Secondary | ICD-10-CM | POA: Diagnosis not present

## 2021-12-23 DIAGNOSIS — Z808 Family history of malignant neoplasm of other organs or systems: Secondary | ICD-10-CM | POA: Diagnosis not present

## 2021-12-23 DIAGNOSIS — I482 Chronic atrial fibrillation, unspecified: Secondary | ICD-10-CM | POA: Diagnosis present

## 2021-12-23 DIAGNOSIS — I4892 Unspecified atrial flutter: Secondary | ICD-10-CM | POA: Diagnosis present

## 2021-12-23 DIAGNOSIS — H9191 Unspecified hearing loss, right ear: Secondary | ICD-10-CM | POA: Diagnosis present

## 2021-12-23 DIAGNOSIS — J449 Chronic obstructive pulmonary disease, unspecified: Secondary | ICD-10-CM | POA: Diagnosis present

## 2021-12-23 DIAGNOSIS — Z9181 History of falling: Secondary | ICD-10-CM | POA: Diagnosis not present

## 2021-12-23 DIAGNOSIS — E876 Hypokalemia: Secondary | ICD-10-CM | POA: Diagnosis present

## 2021-12-23 DIAGNOSIS — Z66 Do not resuscitate: Secondary | ICD-10-CM | POA: Diagnosis present

## 2021-12-23 DIAGNOSIS — R262 Difficulty in walking, not elsewhere classified: Secondary | ICD-10-CM | POA: Diagnosis not present

## 2021-12-23 DIAGNOSIS — N179 Acute kidney failure, unspecified: Secondary | ICD-10-CM | POA: Diagnosis present

## 2021-12-23 DIAGNOSIS — Z950 Presence of cardiac pacemaker: Secondary | ICD-10-CM | POA: Diagnosis not present

## 2021-12-23 DIAGNOSIS — Z741 Need for assistance with personal care: Secondary | ICD-10-CM | POA: Diagnosis not present

## 2021-12-23 DIAGNOSIS — R296 Repeated falls: Secondary | ICD-10-CM | POA: Diagnosis present

## 2021-12-23 DIAGNOSIS — G4733 Obstructive sleep apnea (adult) (pediatric): Secondary | ICD-10-CM | POA: Diagnosis present

## 2021-12-23 DIAGNOSIS — I5022 Chronic systolic (congestive) heart failure: Secondary | ICD-10-CM | POA: Diagnosis present

## 2021-12-23 DIAGNOSIS — I480 Paroxysmal atrial fibrillation: Secondary | ICD-10-CM | POA: Diagnosis not present

## 2021-12-23 DIAGNOSIS — E8809 Other disorders of plasma-protein metabolism, not elsewhere classified: Secondary | ICD-10-CM | POA: Diagnosis present

## 2021-12-23 DIAGNOSIS — R278 Other lack of coordination: Secondary | ICD-10-CM | POA: Diagnosis not present

## 2021-12-23 DIAGNOSIS — C3412 Malignant neoplasm of upper lobe, left bronchus or lung: Secondary | ICD-10-CM | POA: Diagnosis present

## 2021-12-23 DIAGNOSIS — I2510 Atherosclerotic heart disease of native coronary artery without angina pectoris: Secondary | ICD-10-CM | POA: Diagnosis present

## 2021-12-23 DIAGNOSIS — H547 Unspecified visual loss: Secondary | ICD-10-CM | POA: Diagnosis present

## 2021-12-23 DIAGNOSIS — I472 Ventricular tachycardia, unspecified: Secondary | ICD-10-CM | POA: Diagnosis present

## 2021-12-23 DIAGNOSIS — L89152 Pressure ulcer of sacral region, stage 2: Secondary | ICD-10-CM | POA: Diagnosis present

## 2021-12-23 DIAGNOSIS — I252 Old myocardial infarction: Secondary | ICD-10-CM | POA: Diagnosis not present

## 2021-12-23 LAB — COMPREHENSIVE METABOLIC PANEL
ALT: 46 U/L — ABNORMAL HIGH (ref 0–44)
AST: 72 U/L — ABNORMAL HIGH (ref 15–41)
Albumin: 1.7 g/dL — ABNORMAL LOW (ref 3.5–5.0)
Alkaline Phosphatase: 507 U/L — ABNORMAL HIGH (ref 38–126)
Anion gap: 4 — ABNORMAL LOW (ref 5–15)
BUN: 24 mg/dL — ABNORMAL HIGH (ref 8–23)
CO2: 29 mmol/L (ref 22–32)
Calcium: 8.1 mg/dL — ABNORMAL LOW (ref 8.9–10.3)
Chloride: 109 mmol/L (ref 98–111)
Creatinine, Ser: 1.3 mg/dL — ABNORMAL HIGH (ref 0.61–1.24)
GFR, Estimated: 58 mL/min — ABNORMAL LOW (ref >60)
Glucose, Bld: 154 mg/dL — ABNORMAL HIGH (ref 70–99)
Potassium: 3.2 mmol/L — ABNORMAL LOW (ref 3.5–5.1)
Sodium: 142 mmol/L (ref 135–145)
Total Bilirubin: 0.6 mg/dL (ref 0.3–1.2)
Total Protein: 5 g/dL — ABNORMAL LOW (ref 6.5–8.1)

## 2021-12-23 LAB — CBC
HCT: 31.1 % — ABNORMAL LOW (ref 39.0–52.0)
Hemoglobin: 9 g/dL — ABNORMAL LOW (ref 13.0–17.0)
MCH: 24.1 pg — ABNORMAL LOW (ref 26.0–34.0)
MCHC: 28.9 g/dL — ABNORMAL LOW (ref 30.0–36.0)
MCV: 83.4 fL (ref 80.0–100.0)
Platelets: 304 10*3/uL (ref 150–400)
RBC: 3.73 MIL/uL — ABNORMAL LOW (ref 4.22–5.81)
RDW: 16.5 % — ABNORMAL HIGH (ref 11.5–15.5)
WBC: 6.4 10*3/uL (ref 4.0–10.5)
nRBC: 0 % (ref 0.0–0.2)

## 2021-12-23 LAB — GLUCOSE, CAPILLARY
Glucose-Capillary: 119 mg/dL — ABNORMAL HIGH (ref 70–99)
Glucose-Capillary: 155 mg/dL — ABNORMAL HIGH (ref 70–99)
Glucose-Capillary: 130 mg/dL — ABNORMAL HIGH (ref 70–99)
Glucose-Capillary: 109 mg/dL — ABNORMAL HIGH (ref 70–99)

## 2021-12-23 LAB — HEMOGLOBIN A1C
Hgb A1c MFr Bld: 6.3 % — ABNORMAL HIGH (ref 4.8–5.6)
Mean Plasma Glucose: 134.11 mg/dL

## 2021-12-23 MED ORDER — ALBUMIN HUMAN 25 % IV SOLN
50.0000 g | Freq: Two times a day (BID) | INTRAVENOUS | Status: AC
Start: 2021-12-23 — End: 2021-12-24
  Administered 2021-12-23 – 2021-12-24 (×2): 50 g via INTRAVENOUS
  Filled 2021-12-23 (×2): qty 200

## 2021-12-23 MED ORDER — APIXABAN 5 MG PO TABS
5.0000 mg | ORAL_TABLET | Freq: Two times a day (BID) | ORAL | Status: DC
  Administered 2021-12-23 – 2021-12-24 (×4): 5 mg via ORAL
  Filled 2021-12-23 (×4): qty 1

## 2021-12-23 MED ORDER — METOPROLOL SUCCINATE ER 25 MG PO TB24
25.0000 mg | ORAL_TABLET | Freq: Every day | ORAL | Status: DC
  Administered 2021-12-23 – 2021-12-24 (×2): 25 mg via ORAL
  Filled 2021-12-23 (×2): qty 1

## 2021-12-23 MED ORDER — ROSUVASTATIN CALCIUM 20 MG PO TABS
40.0000 mg | ORAL_TABLET | Freq: Every day | ORAL | Status: DC
  Administered 2021-12-23 – 2021-12-24 (×2): 40 mg via ORAL
  Filled 2021-12-23 (×2): qty 2

## 2021-12-23 MED ORDER — BUDESONIDE 0.5 MG/2ML IN SUSP
0.5000 mg | Freq: Two times a day (BID) | RESPIRATORY_TRACT | Status: DC
  Administered 2021-12-23 – 2021-12-24 (×2): 0.5 mg via RESPIRATORY_TRACT
  Filled 2021-12-23 (×3): qty 2

## 2021-12-23 MED ORDER — POTASSIUM CHLORIDE IN NACL 40-0.9 MEQ/L-% IV SOLN: INTRAVENOUS | Status: DC

## 2021-12-23 MED ORDER — ALBUTEROL SULFATE HFA 108 (90 BASE) MCG/ACT IN AERS
2.0000 | INHALATION_SPRAY | Freq: Four times a day (QID) | RESPIRATORY_TRACT | Status: DC | PRN
Start: 2021-12-23 — End: 2021-12-25

## 2021-12-23 MED ORDER — MEXILETINE HCL 150 MG PO CAPS
150.0000 mg | ORAL_CAPSULE | Freq: Two times a day (BID) | ORAL | Status: DC
  Administered 2021-12-23 – 2021-12-24 (×3): 150 mg via ORAL
  Filled 2021-12-23 (×7): qty 1

## 2021-12-23 MED ORDER — MONTELUKAST SODIUM 10 MG PO TABS
10.0000 mg | ORAL_TABLET | Freq: Every day | ORAL | Status: DC
  Administered 2021-12-23 – 2021-12-24 (×2): 10 mg via ORAL
  Filled 2021-12-23 (×2): qty 1

## 2021-12-23 MED ORDER — AMIODARONE HCL 200 MG PO TABS
200.0000 mg | ORAL_TABLET | Freq: Two times a day (BID) | ORAL | Status: DC
Start: 2021-12-23 — End: 2021-12-25
  Administered 2021-12-23 – 2021-12-24 (×4): 200 mg via ORAL
  Filled 2021-12-23 (×4): qty 1

## 2021-12-23 MED ORDER — POTASSIUM CHLORIDE CRYS ER 20 MEQ PO TBCR
40.0000 meq | EXTENDED_RELEASE_TABLET | Freq: Once | ORAL | Status: AC
Start: 2021-12-23 — End: 2021-12-23
  Administered 2021-12-23: 40 meq via ORAL
  Filled 2021-12-23: qty 2

## 2021-12-23 NOTE — Plan of Care (Signed)
  Problem: Acute Rehab OT Goals (only OT should resolve) Goal: Pt. Will Perform Upper Body Bathing Flowsheets (Taken 12/23/2021 0919) Pt Will Perform Upper Body Bathing:  with set-up  sitting Goal: Pt. Will Perform Upper Body Dressing Flowsheets (Taken 12/23/2021 0919) Pt Will Perform Upper Body Dressing:  with set-up  sitting Goal: Pt. Will Perform Lower Body Dressing Flowsheets (Taken 12/23/2021 0919) Pt Will Perform Lower Body Dressing:  with min assist  bed level Goal: Pt. Will Perform Tub/Shower Transfer Flowsheets (Taken 12/23/2021 0919) Pt Will Perform Tub/Shower Transfer:  with min assist  rolling walker  grab bars   Arvil Persons, OTR/L

## 2021-12-23 NOTE — Evaluation (Signed)
Occupational Therapy Evaluation Patient Details Name: John [name] MRN: [id number] DOB: [birth year] Today's Date: 12/23/2021   History of Present Illness WLLIAM GROSSO is a 71 y.o. 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  Patient states that he was contacted today by his cardiologist's office due to low potassium noted in blood work done about 2 days ago.  Patient complained of lightheadedness and frequent falls which has been going on recently, he also complained of weakness and fatigue during the phone call, he was then asked to go to the ED for further evaluation and management.  He denies fever, chest pain, shortness of breath, headache, nausea (Per MD)   Clinical Impression   Pt agreeable to OT/PT evaluation. Pt reports that he lives at home with his wife. He stated that he has had a rapid decline in his health since last August and has recently been experiencing LE weakness, dizziness, and an increased amount of falls. He reports that his wife assists with bathing and dressing (at chair level), but is unable to assist with transfers or functional mobility. Pt reports that he uses a RW at baseline but avoids walking due to weakness and increased falls- he stated that he normally stays in his lift chair during the day and only leaves it to use the bathroom. He stated that when he falls, his neighbor comes over to help him. Due to his vision impairment from a past CVA, he no longer drive. His children assist with driving him to appointments and completing grocery shopping for him. Pt required min assist for bed mobility, min assist for sit to stand transfer, min assist for toilet t/f with use of RW + grab bars + raised toilet  seat, max assist for toilet hygiene. He completed grooming and hand washing at sink level with RW + set up. Discussed the importance of completing short term stay rehab after discharge due to increased need for assist and not having 24/7 physical assist at home. Pt. Would benefit from continued skilled OT services in the acute care setting. Discharge recommendations are stated below.      Recommendations for follow up therapy are one component of a multi-disciplinary discharge planning process, led by the attending physician.  Recommendations may be updated based on patient status, additional functional criteria and insurance authorization.   Follow Up Recommendations  Skilled nursing-short term rehab (<3 hours/day)    Assistance Recommended at Discharge Frequent or constant Supervision/Assistance  Patient can return home with the following A lot of help with walking and/or transfers;A little help with bathing/dressing/bathroom;Assistance with cooking/housework    Functional Status Assessment  Patient has had a recent decline in their functional status and demonstrates the ability to make significant improvements in function in a reasonable and predictable amount of time.  Equipment Recommendations       Recommendations for Other Services       Precautions / Restrictions Precautions Precautions: Fall Restrictions Weight Bearing Restrictions: No      Mobility Bed Mobility Overal bed mobility: Needs Assistance Bed Mobility: Supine to Sit     Supine to sit: Min assist     General bed mobility comments: pt reports he sleeps in his lift chair and uses the chair functions to assist with  bed mobilty. Require min assist to go from supine to sitting EOB    Transfers Overall transfer level: Needs assistance Equipment used: Rolling walker (2 wheels) Transfers: Sit to/from Stand Sit to Stand: Min assist                  Balance Overall balance assessment: History of Falls,  Modified Independent                                         ADL either performed or assessed with clinical judgement   ADL Overall ADL's : Needs assistance/impaired Eating/Feeding: Set up;Bed level   Grooming: Wash/dry hands;Oral care;Set up;Standing Grooming Details (indicate cue type and reason): completed washing hands and brushing teeth with set up at sink level, RW + CGA Upper Body Bathing: Minimal assistance;Bed level   Lower Body Bathing: Moderate assistance;Bed level   Upper Body Dressing : Moderate assistance;Bed level Upper Body Dressing Details (indicate cue type and reason): Pt. donned hospital gown with mod assist at bed level Lower Body Dressing: Moderate assistance;Bed level   Toilet Transfer: Minimal assistance;Grab bars Toilet Transfer Details (indicate cue type and reason): completed toilet t/f with min assist with use of raised toilet seat and grab bars Toileting- Clothing Manipulation and Hygiene: Maximal assistance Toileting - Clothing Manipulation Details (indicate cue type and reason): required max assist for posterior toilet hygiene, completed in standing with use of grab bars Tub/ Shower Transfer: Moderate assistance;Shower seat;Rolling walker (2 wheels)   Functional mobility during ADLs: Minimal assistance;Rolling walker (2 wheels)       Vision Baseline Vision/History: 1 Wears glasses (wear eye patch, unable to see without eye patch on (residual visual impairment from past CVA)) Ability to See in Adequate Light: 0 Adequate Patient Visual Report: No change from baseline Vision Assessment?: No apparent visual deficits (pt reports his vision is at baseline) Additional Comments: Pt wear eye patch at baseline     Perception     Praxis      Pertinent Vitals/Pain Pain Assessment Pain Assessment: Faces Faces Pain Scale: Hurts little more Pain Location: R buttocks, where he fell Pain Descriptors / Indicators: Grimacing, Aching Pain  Intervention(s): Monitored during session     Hand Dominance Right   Extremity/Trunk Assessment Upper Extremity Assessment Upper Extremity Assessment: Generalized weakness   Lower Extremity Assessment Lower Extremity Assessment: Defer to PT evaluation   Cervical / Trunk Assessment Cervical / Trunk Assessment: Normal   Communication Communication Communication: No difficulties   Cognition Arousal/Alertness: Awake/alert Behavior During Therapy: WFL for tasks assessed/performed Overall Cognitive Status: Within Functional Limits for tasks assessed                                                        Home Living Family/patient expects to be discharged to:: Private residence Living Arrangements: Spouse/significant other Available Help at Discharge: Family;Neighbor (Lives with wife who is able to assist with bathing and dressing, but unable to provide physical assist for transfers and functional mobilty. Per pt, his neighbor assist with helping him up when he falls. His children assist with driving and shopping.) Type of Home: Mobile home Home Access: Stairs to enter;Ramped entrance Entrance Stairs-Number of Steps: 3.5 Entrance Stairs-Rails: Right;Left Home Layout:  One level     Bathroom Shower/Tub: Occupational psychologist: Handicapped height Bathroom Accessibility: Yes   Home Equipment: Conservation officer, nature (2 wheels);Cane - single point;Shower seat;Grab bars - tub/shower;Grab bars - toilet          Prior Functioning/Environment Prior Level of Function : Needs assist       Physical Assist : Mobility (physical);ADLs (physical) Mobility (physical): Gait;Stairs;Transfers ADLs (physical): Bathing;Dressing Mobility Comments: Walks with RW, pt reports that he usually stays in his lift chair and only walks when needed. ADLs Comments: Wife assists with bathing and dressing. Does not drive due to visual impairment        OT Problem List:  Decreased strength;Decreased activity tolerance;Impaired balance (sitting and/or standing)      OT Treatment/Interventions: Therapeutic exercise;Self-care/ADL training;DME and/or AE instruction;Energy conservation;Therapeutic activities;Patient/family education    OT Goals(Current goals can be found in the care plan section) Acute Rehab OT Goals Patient Stated Goal: to return home OT Goal Formulation: With patient Time For Goal Achievement: 01/06/22 Potential to Achieve Goals: Fair ADL Goals Pt Will Perform Upper Body Bathing: with set-up;sitting Pt Will Perform Upper Body Dressing: with set-up;sitting Pt Will Perform Lower Body Dressing: with min assist;bed level Pt Will Perform Tub/Shower Transfer: with min assist;rolling walker;grab bars  OT Frequency: Min 2X/week    Co-evaluation PT/OT/SLP Co-Evaluation/Treatment: Yes Reason for Co-Treatment: To address functional/ADL transfers   OT goals addressed during session: ADL's and self-care      AM-PAC OT "6 Clicks" Daily Activity     Outcome Measure Help from another person eating meals?: A Little Help from another person taking care of personal grooming?: A Little Help from another person toileting, which includes using toliet, bedpan, or urinal?: A Lot Help from another person bathing (including washing, rinsing, drying)?: A Lot Help from another person to put on and taking off regular upper body clothing?: A Little Help from another person to put on and taking off regular lower body clothing?: A Lot 6 Click Score: 15   End of Session Equipment Utilized During Treatment: Rolling walker (2 wheels) Nurse Communication: Mobility status  Activity Tolerance: Patient tolerated treatment well Patient left: in chair  OT Visit Diagnosis: Muscle weakness (generalized) (M62.81);History of falling (Z91.81)                Time: 4967-5916 OT Time Calculation (min): 33 min Charges:  OT General Charges $OT Visit: 1 Visit OT  Evaluation $OT Eval Low Complexity: [place], OTR/L 12/23/2021, 9:20 AM

## 2021-12-23 NOTE — Evaluation (Signed)
Physical Therapy Evaluation Patient Details Name: John [name] MRN: [id number] DOB: [birth year] Today's Date: 12/23/2021  History of Present Illness  John [name] is a 71 y.o. 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  Patient states that he was contacted today by his cardiologist's office due to low potassium noted in blood work done about 2 days ago.  Patient complained of lightheadedness and frequent falls which has been going on recently, he also complained of weakness and fatigue during the phone call, he was then asked to go to the ED for further evaluation and management.  He denies fever, chest pain, shortness of breath, headache, nausea   Clinical Impression  Patient presents supine in bed and consents to PT/OT co-evaluation. Patient is min/mod assist with bed mobility requiring extended time while demonstrating slow labored movement. Able to roll but struggles with pushing up with B UE into sitting position due to generalized weakness. Requires trunk assist and HHA to reach sitting position. Trunk control improves in sitting with ability to scoot to EOB. Patient is min guard/assist with transfers and use of RW. Demonstrates slow cadence due to balance deficits but able to perform task with good technical form. Fair coordination of B LE. Patient was able to ambulate for 50 feet in hallway with min guard and use of RW. Moderate gait deviations including steppage that impacts gait pattern and overall balancing ability. Limited by fatigue resulting in decreased coordination and balance. Patient tolerated sitting up in chair after therapy with nursing staff notified of mobility status. Patient will benefit from continued  skilled physical therapy in hospital and recommended venue below to increase strength, balance, endurance for safe ADLs and gait.       Recommendations for follow up therapy are one component of a multi-disciplinary discharge planning process, led by the attending physician.  Recommendations may be updated based on patient status, additional functional criteria and insurance authorization.  Follow Up Recommendations Skilled nursing-short term rehab (<3 hours/day) Can patient physically be transported by private vehicle: Yes    Assistance Recommended at Discharge Set up Supervision/Assistance  Patient can return home with the following  A little help with walking and/or transfers;Help with stairs or ramp for entrance;Assistance with cooking/housework;Assist for transportation    Equipment Recommendations None recommended by PT  Recommendations for Other Services       Functional Status Assessment Patient has had a recent decline in their functional status and demonstrates the ability to make significant improvements in function in a reasonable and predictable amount of time.     Precautions / Restrictions Precautions Precautions: Fall Restrictions Weight Bearing Restrictions: No      Mobility  Bed Mobility Overal bed mobility: Needs Assistance Bed Mobility: Supine to Sit     Supine to sit: Min assist, Mod assist     General bed mobility comments: Min/mod assist with bed mobility due to extended time needed and slow labored movement demonstrated. Able to roll but struggles pushing off UE requiring trunk assist and HHA by PT. Fair LE movement and coordination    Transfers Overall transfer level: Needs assistance Equipment used: Rolling walker (2 wheels) Transfers: Sit to/from Stand, Bed to chair/wheelchair/BSC Sit to Stand: Min assist, Min guard   Step pivot transfers: [employer]  guard, Min assist       General transfer comment: Min guard/assist with transfers and use of RW.  Slow cadence due to balance deficits but able to perform task with good technical form.    Ambulation/Gait Ambulation/Gait assistance: Min guard Gait Distance (Feet): 50 Feet Assistive device: Rolling walker (2 wheels) Gait Pattern/deviations: Step-to pattern, Decreased step length - left, Decreased step length - right, Decreased stride length, Trunk flexed, Drifts right/left, Steppage Gait velocity: decreased     General Gait Details: Ambulates 50 feet in hallway with min guard and RW. Moderate gait deviations including steppage that impacts gait pattern and overall balancing ability. Limited by fatigue resulting in decreased coordination and balance.  Stairs            Wheelchair Mobility    Modified Rankin (Stroke Patients Only)       Balance Overall balance assessment: History of Falls, Needs assistance Sitting-balance support: Bilateral upper extremity supported, Feet supported Sitting balance-Leahy Scale: Good Sitting balance - Comments: seated EOB with UE support   Standing balance support: Bilateral upper extremity supported, During functional activity, Reliant on assistive device for balance Standing balance-Leahy Scale: Fair Standing balance comment: Poor/fair with RW but fatigues quickly                             Pertinent Vitals/Pain Pain Assessment Pain Assessment: Faces Faces Pain Scale: Hurts little more Pain Location: R buttocks, where he fell Pain Descriptors / Indicators: Grimacing, Aching Pain Intervention(s): Limited activity within patient's tolerance, Monitored during session, Repositioned    Home Living Family/patient expects to be discharged to:: Private residence Living Arrangements: Spouse/significant other Available Help at Discharge: Family;Neighbor Type of Home: Mobile home Home Access: Stairs to enter;Ramped entrance Entrance Stairs-Rails: Right;Left;Can reach both Entrance Stairs-Number of Steps: 3.5   Home Layout:  One level Home Equipment: Conservation officer, nature (2 wheels);Cane - single point;Shower seat;Grab bars - tub/shower;Grab bars - toilet      Prior Function Prior Level of Function : Needs assist       Physical Assist : ADLs (physical) Mobility (physical): Gait;Stairs;Transfers ADLs (physical): Bathing;Dressing Mobility Comments: Patient reports ambulating at home independently with RW. Not a community ambulator and is not currently driving. ADLs Comments: Wife assists with bathing and dressing. Does not drive due to visual impairment     Hand Dominance   Dominant Hand: Right    Extremity/Trunk Assessment   Upper Extremity Assessment Upper Extremity Assessment: Generalized weakness    Lower Extremity Assessment Lower Extremity Assessment: Generalized weakness    Cervical / Trunk Assessment Cervical / Trunk Assessment: Normal  Communication   Communication: No difficulties  Cognition Arousal/Alertness: Awake/alert Behavior During Therapy: WFL for tasks assessed/performed Overall Cognitive Status: Within Functional Limits for tasks assessed                                          General Comments      Exercises     Assessment/Plan    PT Assessment Patient needs continued PT services;All further PT needs can be met in the next venue of care  PT Problem List Decreased strength;Decreased activity tolerance;Decreased balance;Decreased mobility;Decreased coordination       PT Treatment Interventions DME instruction;Gait training;Functional mobility training;Therapeutic activities;Therapeutic exercise;Balance training    PT Goals (Current goals can be found in the Care Plan section)  Acute Rehab PT Goals Patient Stated Goal: return home PT Goal Formulation: With patient Time For Goal Achievement: 01/06/22 Potential to Achieve Goals: Good    Frequency Min 3X/week     Co-evaluation PT/OT/SLP Co-Evaluation/Treatment: Yes Reason for Co-Treatment: To  address functional/ADL transfers PT goals addressed during session: Mobility/safety with mobility;Balance;Proper use of DME;Strengthening/ROM OT goals addressed during session: ADL's and self-care       AM-PAC PT "6 Clicks" Mobility  Outcome Measure Help needed turning from your back to your side while in a flat bed without using bedrails?: A Little Help needed moving from lying on your back to sitting on the side of a flat bed without using bedrails?: A Little Help needed moving to and from a bed to a chair (including a wheelchair)?: A Lot Help needed standing up from a chair using your arms (e.g., wheelchair or bedside chair)?: A Lot Help needed to walk in hospital room?: A Lot Help needed climbing 3-5 steps with a railing? : A Lot 6 Click Score: 14    End of Session   Activity Tolerance: Patient tolerated treatment well;No increased pain;Patient limited by fatigue Patient left: in chair;with call bell/phone within reach Nurse Communication: Mobility status PT Visit Diagnosis: Unsteadiness on feet (R26.81);Other abnormalities of gait and mobility (R26.89);Muscle weakness (generalized) (M62.81)    Time: 7902-4097 PT Time Calculation (min) (ACUTE ONLY): 31 min   Charges:   PT Evaluation $PT Eval Moderate Complexity: 1 Mod PT Treatments $Therapeutic Activity: 23-37 mins        11:33 AM, 12/23/21 Lestine Box, S/PT

## 2021-12-23 NOTE — Care Management Obs Status (Signed)
[place] NOTIFICATION   Patient Details  Name: CAETANO OBERHAUS MRN: [id number] Date of Birth: 04-30-1951   Medicare Observation Status Notification Given:  Yes    Boneta Lucks, RN 12/23/2021, 12:46 PM

## 2021-12-23 NOTE — Plan of Care (Signed)
  Problem: Acute Rehab PT Goals(only PT should resolve) Goal: Pt Will Go Supine/Side To Sit Outcome: Progressing Flowsheets (Taken 12/23/2021 1134) Pt will go Supine/Side to Sit:  with min guard assist  with minimal assist Goal: Patient Will Transfer Sit To/From Stand Outcome: Progressing Flowsheets (Taken 12/23/2021 1134) Patient will transfer sit to/from stand:  with min guard assist  with minimal assist Goal: Pt Will Transfer Bed To Chair/Chair To Bed Outcome: Progressing Flowsheets (Taken 12/23/2021 1134) Pt will Transfer Bed to Chair/Chair to Bed:  min guard assist  with supervision Goal: Pt Will Perform Standing Balance Or Pre-Gait Outcome: Progressing Flowsheets (Taken 12/23/2021 1134) Pt will perform standing balance or pre-gait:  1-2 min  with bilateral UE support  with min guard assist  with Supervision Goal: Pt Will Ambulate Outcome: Progressing Flowsheets (Taken 12/23/2021 1134) Pt will Ambulate:  100 feet  with min guard assist  with minimal assist  with rolling walker   11:35 AM, 12/23/21 Lestine Box, S/PT

## 2021-12-23 NOTE — TOC Initial Note (Signed)
Transition of Care [facility]) - Initial/Assessment Note    Patient Details  Name: John [name] MRN: [id number] Date of Birth: Oct 07, 1950  [facility]) CM/SW Contact:    Boneta Lucks, RN Phone Number: 12/23/2021, 12:48 PM  Clinical Narrative:      Patient admitted with Hypokalemia. Patient lives at home with his wife. Wife and daughter at the bedside. Patient does not drive walks with a walker and uses a lift chair. Daughter provides transportation for both parents and goes to the house every other day to assist in the home. PT is recommending SNF. Patient is agreeable, requesting [facility]. FL2 sent out.            Expected Discharge Plan: Skilled Nursing Facility Barriers to Discharge: Continued Medical Work up   Patient Goals and CMS Choice Patient states their goals for this hospitalization and ongoing recovery are:: agreeable to SNF CMS Medicare.gov Compare Post Acute Care list provided to:: Patient Choice offered to / list presented to : Patient  Expected Discharge Plan and Services Expected Discharge Plan: [place] arrangements for the past 2 months: Single Family Home          Prior Living Arrangements/Services Living arrangements for the past 2 months: Single Family Home Lives with:: Spouse Patient language and need for interpreter reviewed:: Yes        Need for Family Participation in Patient Care: Yes (Comment) Care giver support system in place?: Yes (comment) Current home services: DME Criminal Activity/Legal Involvement Pertinent to Current Situation/Hospitalization: Yes - Comment as needed  Activities of Daily Living Home Assistive Devices/Equipment: Environmental consultant (specify type), Shower chair with back, Wheelchair ADL Screening (condition at time of admission) Patient's cognitive ability adequate to safely complete daily activities?: Yes Is the patient deaf or have difficulty hearing?: Yes Does the patient have  difficulty seeing, even when wearing glasses/contacts?: No Does the patient have difficulty concentrating, remembering, or making decisions?: No Patient able to express need for assistance with ADLs?: Yes Does the patient have difficulty dressing or bathing?: No Independently performs ADLs?: No Communication: Independent Dressing (OT): Independent Grooming: Independent Feeding: Independent Bathing: Needs assistance Is this a change from baseline?: Pre-admission baseline Toileting: Needs assistance Is this a change from baseline?: Pre-admission baseline In/Out Bed: Needs assistance Is this a change from baseline?: Pre-admission baseline Walks in Home: Needs assistance Is this a change from baseline?: Pre-admission baseline Does the patient have difficulty walking or climbing stairs?: Yes Weakness of Legs: Left Weakness of Arms/Hands: None  Permission Sought/Granted   Emotional Assessment     Affect (typically observed): Accepting Orientation: : Oriented to Self, Oriented to Place, Oriented to  Time, Oriented to Situation Alcohol / Substance Use: Not Applicable Psych Involvement: No (comment)  Admission diagnosis:  Hypokalemia [E87.6] Ventricular tachycardia (HCC) [I47.20] Patient Active Problem List   Diagnosis Date Noted   Hypokalemia 12/22/2021   Generalized weakness 12/22/2021   Fatigue 12/22/2021   Recurrent falls 12/22/2021   Prolonged QT interval 12/22/2021   Hypoglycemia 11/05/2021   Atrial fibrillation, chronic ([place]) 11/05/2021   Goals of care, counseling/discussion    Shortness of breath 10/27/2021   DVT of left axillary vein, acute ([place]) 95/18/8416   Embolic stroke ([place]) 60/63/0160   History of non-ST elevation myocardial infarction (NSTEMI) 10/93/2355   Chronic systolic CHF (congestive heart failure) ([place]) 02/08/2021   VT (ventricular tachycardia) ([place]) 01/26/2021   Coagulation defect ([place]) 12/22/2020   Non-ST elevation (NSTEMI) myocardial  infarction [facility])     Capsulitis 05/21/2020   Thoracic aortic aneurysm ([place]) 02/29/2020   Malignant neoplasm of upper lobe of left lung ([place]) 02/19/2020   Former smoker 02/19/2020   Acoustic neuroma ([place]) 09/11/2017   Asymmetrical sensorineural hearing loss 09/11/2017   Anticoagulation adequate 06/06/2016   Mixed hyperlipidemia 05/20/2015   OSA (obstructive sleep apnea) 07/23/2013   Hypertension    Hyperlipidemia    Tobacco abuse 08/18/2012   CAD, multiple prior RCA PCI's. Last cath 2010, Myoview low risk Oct 2012 02/26/2012   Type 2 diabetes mellitus ([place]) 02/26/2012   HTN (hypertension) 02/26/2012   COPD (chronic obstructive pulmonary disease) ([place]) 02/26/2012   Smoking, quit one week ago 02/26/2012   GERD (gastroesophageal reflux disease) 02/26/2012   PCP:  [name], MD Pharmacy:   [id number] [place] [place] [address] Phone: [phone] Fax: [phone]    Readmission Risk Interventions    04/22/2021   12:09 PM 02/25/2021    3:57 PM 02/18/2021   12:28 PM  Readmission Risk Prevention Plan  Transportation Screening Complete Complete Complete  PCP or Specialist Appt within 3-5 Days Complete Complete   HRI or Home Care Consult Complete Complete   Social Work Consult for [place] Planning/Counseling Complete Complete   Palliative Care Screening Not Applicable Not Applicable   Medication Review Press photographer) Complete Complete Complete  PCP or Specialist appointment within 3-5 days of discharge   Complete  HRI or [place]   Complete  SW Recovery Care/Counseling Consult   Complete  [place]   Not Applicable

## 2021-12-23 NOTE — NC FL2 (Signed)
[place] LEVEL OF CARE SCREENING TOOL     IDENTIFICATION  Patient Name: John [name] Birthdate: November 30, 1950 Sex: male Admission Date (Current Location): 12/22/2021  [facility] and [place] Number:  [employer] and Address:  [place] [address] [place]      Provider Number: [id number]  Attending Physician Name and Address:  Kathie Dike, MD  Relative Name and Phone Number:       Current Level of Care: Hospital Recommended Level of Care: [place] Prior Approval Number:    Date Approved/Denied:   PASRR Number: [id number]  Discharge Plan: SNF    Current Diagnoses: Patient Active Problem List   Diagnosis Date Noted   Hypokalemia 12/22/2021   Generalized weakness 12/22/2021   Fatigue 12/22/2021   Recurrent falls 12/22/2021   Prolonged QT interval 12/22/2021   Hypoglycemia 11/05/2021   Atrial fibrillation, chronic ([place]) 11/05/2021   Goals of care, counseling/discussion    Shortness of breath 10/27/2021   DVT of left axillary vein, acute ([place]) 90/24/0973   Embolic stroke ([place]) 53/29/9242   History of non-ST elevation myocardial infarction (NSTEMI) 68/34/1962   Chronic systolic CHF (congestive heart failure) ([place]) 02/08/2021   VT (ventricular tachycardia) ([place]) 01/26/2021   Coagulation defect ([place]) 12/22/2020   Non-ST elevation (NSTEMI) myocardial infarction ([place])    Capsulitis 05/21/2020   Thoracic aortic aneurysm ([place]) 02/29/2020   Malignant neoplasm of upper lobe of left lung ([place]) 02/19/2020   Former smoker 02/19/2020   Acoustic neuroma ([place]) 09/11/2017   Asymmetrical sensorineural hearing loss 09/11/2017   Anticoagulation adequate 06/06/2016   Mixed hyperlipidemia 05/20/2015   OSA (obstructive sleep apnea) 07/23/2013   Hypertension    Hyperlipidemia    Tobacco abuse 08/18/2012   CAD, multiple prior RCA PCI's. Last cath 2010, Myoview low risk Oct 2012 02/26/2012   Type 2 diabetes  mellitus ([place]) 02/26/2012   HTN (hypertension) 02/26/2012   COPD (chronic obstructive pulmonary disease) ([place]) 02/26/2012   Smoking, quit one week ago 02/26/2012   GERD (gastroesophageal reflux disease) 02/26/2012    Orientation RESPIRATION BLADDER Height & Weight     Self, Time, Situation, Place  Normal Continent Weight: 175 lb 4.3 oz (79.5 kg) Height:  6' (182.9 cm)  BEHAVIORAL SYMPTOMS/MOOD NEUROLOGICAL BOWEL NUTRITION STATUS      Continent Diet (Heart Healthy)  AMBULATORY STATUS COMMUNICATION OF NEEDS Skin   Extensive Assist Verbally Normal                       Personal Care Assistance Level of Assistance  Bathing, Feeding, Dressing Bathing Assistance: Limited assistance Feeding assistance: Independent Dressing Assistance: Limited assistance     Functional Limitations Info  Sight, Speech, Hearing Sight Info: Impaired Hearing Info: Impaired Speech Info: Adequate    SPECIAL CARE FACTORS FREQUENCY  PT (By licensed PT), OT (By licensed OT)     PT Frequency: 5 times weekly OT Frequency: 5 times weekly            Contractures Contractures Info: Not present    Additional Factors Info  Code Status, Allergies Code Status Info: DNR Allergies Info: Omega-3 Fatty Acids, Benazepril, Fish Allergy           Current Medications (12/23/2021):  This is the current hospital active medication list Current Facility-Administered Medications  Medication Dose Route Frequency Provider Last Rate Last Admin   acetaminophen (TYLENOL) tablet 650 mg  650 mg Oral Q6H PRN Adefeso, Oladapo, DO  650 mg at 12/23/21 0518   Or   acetaminophen (TYLENOL) suppository 650 mg  650 mg Rectal Q6H PRN Adefeso, Oladapo, DO       albuterol (VENTOLIN HFA) 108 (90 Base) MCG/ACT inhaler 2 puff  2 puff Inhalation Q6H PRN Adefeso, Oladapo, DO       amiodarone (PACERONE) tablet 200 mg  200 mg Oral BID Adefeso, Oladapo, DO   200 mg at 12/23/21 1059   apixaban (ELIQUIS) tablet 5 mg  5 mg Oral BID  Adefeso, Oladapo, DO   5 mg at 12/23/21 1059   insulin aspart (novoLOG) injection 0-9 Units  0-9 Units Subcutaneous TID WC Adefeso, Oladapo, DO       mexiletine (MEXITIL) capsule 150 mg  150 mg Oral Q12H Adefeso, Oladapo, DO   150 mg at 12/23/21 1059   rosuvastatin (CRESTOR) tablet 40 mg  40 mg Oral Daily Adefeso, Oladapo, DO   40 mg at 12/23/21 1059     Discharge Medications: Please see discharge summary for a list of discharge medications.  Relevant Imaging Results:  Relevant Lab Results:   Additional Information SSN: [id number], [place]

## 2021-12-23 NOTE — Progress Notes (Signed)
PROGRESS NOTE    John [name]  VPX:[id number] DOB: [birth year] DOA: 12/22/2021 PCP: [name], MD    Brief Narrative:  71 year old male with a history of A-fib, lung cancer, coronary artery disease, obstructive sleep apnea, recently experiencing dizziness, lightheadedness and frequent falls.  Noted to be hypokalemic with mild AKI.  Admitted for further management.   Assessment & Plan:   Principal Problem:   Hypokalemia Active Problems:   COPD (chronic obstructive pulmonary disease) (HCC)   Type 2 diabetes mellitus (HCC)   CAD, multiple prior RCA PCI's. Last cath 2010, Myoview low risk Oct 2012   OSA (obstructive sleep apnea)   Mixed hyperlipidemia   Chronic systolic CHF (congestive heart failure) (HCC)   Atrial fibrillation, chronic (HCC)   Generalized weakness   Fatigue   Recurrent falls   Prolonged QT interval   Hypokalemia K+ was 2.7 on admission Improving with replacement Magnesium 2.1   Generalized weakness and fatigue Recurrent falls Continue fall precaution neurochecks Seen by PT/OT with recommendations for SNF   Prolonged QT interval Likely related to hypokalemia Qtc 588, but patient has a pacemaker Avoid QT prolonging drugs Magnesium 2.1 Continue telemetry     Chronic systolic CHF Echocardiogram done on on 04/18/2021 showed LVEF of 25 to 30%.  LV severely decreased function.  RWMA.  LV diastolic parameters are indeterminate.  Elevated LVEDP.  Severe hypokinesis of LV, basal mid inferoseptal wall, lateral wall.  Akinesis of the LV, basal mid inferior wall and inferolateral wall. Continue Crestor Holding Lasix since he does appear to be on the drier side   Acute kidney injury BUN/creatinine 28/1.47 (baseline creatinine at 1.1-1.2) Renally adjust medications, avoid nephrotoxic agents/dehydration/hypotension -Provide gentle hydration overnight   Chronic atrial fibrillation/flutter Continue Eliquis Continue amiodarone, metoprolol  Type II  DM Continue ISS and hypoglycemic protocol   COPD Continue albuterol  Mixed hyperlipidemia Continue Crestor  CAD s/p stent placement Continue Eliquis, Crestor  Hypoalbuminemia -Albumin 1.7 -Likely related to poor p.o. intake -Provide albumin infusions   Goals of care: -Seen by palliative care during last admission in 10/2021 -Agreed to DNR at that time and MOST form was completed   DVT prophylaxis: SCDs Start: 12/22/21 2256 apixaban (ELIQUIS) tablet 5 mg  Code Status: DNR Family Communication: Discussed with patient Disposition Plan: Status is: Inpatient Remains inpatient appropriate because: Continued IV fluids     Consultants:    Procedures:    Antimicrobials:      Subjective: Still feels weak.  Has discomfort in his sacral area where he feels there is a pressure injury.  Reports poor p.o. intake recently.  Objective: Vitals:   12/23/21 0521 12/23/21 0610 12/23/21 1316 12/23/21 1416  BP: 100/62 97/65 119/76 109/69  Pulse: 70 72 72 70  Resp: 18 19 20 18   Temp:  98 F (36.7 C) 98.2 F (36.8 C)   TempSrc:  Oral Oral   SpO2: 96% 96% 100% 100%  Weight:  79.5 kg    Height:  6' (1.829 m)      Intake/Output Summary (Last 24 hours) at 12/23/2021 1841 Last data filed at 12/23/2021 1821 Gross per 24 hour  Intake 1200 ml  Output 600 ml  Net 600 ml   Filed Weights   12/22/21 1528 12/22/21 2224 12/23/21 0610  Weight: 79.8 kg 78.7 kg 79.5 kg    Examination:  General exam: Appears calm and comfortable, eye patch over left eye  Respiratory system: Clear to auscultation. Respiratory effort normal. Cardiovascular system: S1 & S2 heard,  RRR. No JVD, murmurs, rubs, gallops or clicks.  Gastrointestinal system: Abdomen is nondistended, soft and nontender. No organomegaly or masses felt. Normal bowel sounds heard. Central nervous system: Alert and oriented. No focal neurological deficits. Extremities: 2+ edema of the lower extremities bilaterally about  baseline Skin: Stage II sacral pressure injury, present on admission Psychiatry: Judgement and insight appear normal. Mood & affect appropriate.     Data Reviewed: I have personally reviewed following labs and imaging studies  CBC: Recent Labs  Lab 12/22/21 1722 12/23/21 0520  WBC 7.8 6.4  NEUTROABS 6.4  --   HGB 9.7* 9.0*  HCT 32.5* 31.1*  MCV 82.1 83.4  PLT 349 710   Basic Metabolic Panel: Recent Labs  Lab 12/21/21 1139 12/22/21 1722 12/23/21 0520  NA 140 140 142  K 3.1* 2.7* 3.2*  CL 103 105 109  CO2 24 28 29   GLUCOSE 241* 93 154*  BUN 28* 28* 24*  CREATININE 1.35* 1.47* 1.30*  CALCIUM 8.1* 8.3* 8.1*  MG  --  2.1  --    GFR: Estimated Creatinine Clearance: 55.5 mL/min (A) (by C-G formula based on SCr of 1.3 mg/dL (H)). Liver Function Tests: Recent Labs  Lab 12/23/21 0520  AST 72*  ALT 46*  ALKPHOS 507*  BILITOT 0.6  PROT 5.0*  ALBUMIN 1.7*   No results for input(s): "LIPASE", "AMYLASE" in the last 168 hours. No results for input(s): "AMMONIA" in the last 168 hours. Coagulation Profile: No results for input(s): "INR", "PROTIME" in the last 168 hours. Cardiac Enzymes: No results for input(s): "CKTOTAL", "CKMB", "CKMBINDEX", "TROPONINI" in the last 168 hours. BNP (last 3 results) Recent Labs    06/30/21 0908 12/13/21 1450  PROBNP 1,179* 2,463*   HbA1C: Recent Labs    12/23/21 0520  HGBA1C 6.3*   CBG: Recent Labs  Lab 12/23/21 0714 12/23/21 1113 12/23/21 1626  GLUCAP 119* 155* 130*   Lipid Profile: No results for input(s): "CHOL", "HDL", "LDLCALC", "TRIG", "CHOLHDL", "LDLDIRECT" in the last 72 hours. Thyroid Function Tests: No results for input(s): "TSH", "T4TOTAL", "FREET4", "T3FREE", "THYROIDAB" in the last 72 hours. Anemia Panel: No results for input(s): "VITAMINB12", "FOLATE", "FERRITIN", "TIBC", "IRON", "RETICCTPCT" in the last 72 hours. Sepsis Labs: No results for input(s): "PROCALCITON", "LATICACIDVEN" in the last 168  hours.  No results found for this or any previous visit (from the past 240 hour(s)).       Radiology Studies: DG Chest Port 1 View  Result Date: 12/22/2021 CLINICAL DATA:  Palpitations.  Pacemaker fired. EXAM: PORTABLE CHEST 1 VIEW COMPARISON:  Radiographs and CT 11/05/2021. FINDINGS: Dual lead left-sided pacemaker in place, lead tips projecting over the right atrium and ventricle. Pacemaker wires are intact. Stable mild cardiomegaly. Stable left perihilar opacity with a fiducial marker clip. No acute airspace disease. No features of pulmonary edema. Chronic elevation of left hemidiaphragm. No pleural effusion or pneumothorax. Stable osseous structures. IMPRESSION: 1. No acute findings.  Stable left perihilar opacity from prior. 2. Left-sided pacemaker in place.  Unchanged mild cardiomegaly. Electronically Signed   By: Keith Rake M.D.   On: 12/22/2021 17:08        Scheduled Meds:  amiodarone  200 mg Oral BID   apixaban  5 mg Oral BID   budesonide  0.5 mg Nebulization BID   insulin aspart  0-9 Units Subcutaneous TID WC   metoprolol succinate  25 mg Oral Daily   mexiletine  150 mg Oral Q12H   montelukast  10 mg Oral Daily  rosuvastatin  40 mg Oral Daily   Continuous Infusions:  0.9 % NaCl with KCl 40 mEq / L     albumin human 50 g (12/23/21 1611)     LOS: 0 days    Time spent: 60mins              Kathie Dike, MD [facility]   If 7PM-7AM, please contact night-coverage [email or web address]  12/23/2021, 6:41 PM

## 2021-12-23 NOTE — TOC Progression Note (Signed)
Transition of Care [facility]) - Progression Note    Patient Details  Name: John [name] MRN: [id number] Date of Birth: Dec 13, 1950  Transition of Care [facility]) CM/SW Contact  Boneta Lucks, RN Phone Number: 12/23/2021, 3:02 PM  Clinical Narrative:   [facility] offered. Wife accepted. MD planning to DC on Saturday, Marianna Fuss will accept with DC summary.    Expected Discharge Plan: [place] Barriers to Discharge: Continued Medical Work up  Expected Discharge Plan and Services Expected Discharge Plan: [place] arrangements for the past 2 months: Single Family Home                 Readmission Risk Interventions    04/22/2021   12:09 PM 02/25/2021    3:57 PM 02/18/2021   12:28 PM  Readmission Risk Prevention Plan  Transportation Screening Complete Complete Complete  PCP or Specialist Appt within 3-5 Days Complete Complete   HRI or [place] Complete Complete   Social Work Consult for [place] Planning/Counseling Complete Complete   Palliative Care Screening Not Applicable Not Applicable   Medication Review [employer]) Complete Complete Complete  PCP or Specialist appointment within 3-5 days of discharge   Complete  HRI or [place]   Complete  SW Recovery Care/Counseling Consult   Complete  [place]   Not Applicable

## 2021-12-24 DIAGNOSIS — Z741 Need for assistance with personal care: Secondary | ICD-10-CM | POA: Diagnosis not present

## 2021-12-24 DIAGNOSIS — Z9181 History of falling: Secondary | ICD-10-CM | POA: Diagnosis not present

## 2021-12-24 DIAGNOSIS — R278 Other lack of coordination: Secondary | ICD-10-CM | POA: Diagnosis not present

## 2021-12-24 DIAGNOSIS — J449 Chronic obstructive pulmonary disease, unspecified: Secondary | ICD-10-CM | POA: Diagnosis not present

## 2021-12-24 DIAGNOSIS — R0781 Pleurodynia: Secondary | ICD-10-CM | POA: Diagnosis not present

## 2021-12-24 DIAGNOSIS — I700 Atherosclerosis of aorta: Secondary | ICD-10-CM | POA: Diagnosis not present

## 2021-12-24 DIAGNOSIS — C3412 Malignant neoplasm of upper lobe, left bronchus or lung: Secondary | ICD-10-CM | POA: Diagnosis not present

## 2021-12-24 DIAGNOSIS — R748 Abnormal levels of other serum enzymes: Secondary | ICD-10-CM | POA: Diagnosis not present

## 2021-12-24 DIAGNOSIS — E876 Hypokalemia: Secondary | ICD-10-CM | POA: Diagnosis not present

## 2021-12-24 DIAGNOSIS — I480 Paroxysmal atrial fibrillation: Secondary | ICD-10-CM | POA: Diagnosis not present

## 2021-12-24 DIAGNOSIS — E1143 Type 2 diabetes mellitus with diabetic autonomic (poly)neuropathy: Secondary | ICD-10-CM | POA: Diagnosis not present

## 2021-12-24 DIAGNOSIS — I2510 Atherosclerotic heart disease of native coronary artery without angina pectoris: Secondary | ICD-10-CM | POA: Diagnosis not present

## 2021-12-24 DIAGNOSIS — K8020 Calculus of gallbladder without cholecystitis without obstruction: Secondary | ICD-10-CM | POA: Diagnosis not present

## 2021-12-24 DIAGNOSIS — G4733 Obstructive sleep apnea (adult) (pediatric): Secondary | ICD-10-CM | POA: Diagnosis not present

## 2021-12-24 DIAGNOSIS — R531 Weakness: Secondary | ICD-10-CM | POA: Diagnosis not present

## 2021-12-24 DIAGNOSIS — N281 Cyst of kidney, acquired: Secondary | ICD-10-CM | POA: Diagnosis not present

## 2021-12-24 DIAGNOSIS — M6281 Muscle weakness (generalized): Secondary | ICD-10-CM | POA: Diagnosis not present

## 2021-12-24 DIAGNOSIS — R9431 Abnormal electrocardiogram [ECG] [EKG]: Secondary | ICD-10-CM | POA: Diagnosis not present

## 2021-12-24 DIAGNOSIS — I10 Essential (primary) hypertension: Secondary | ICD-10-CM | POA: Diagnosis not present

## 2021-12-24 DIAGNOSIS — E43 Unspecified severe protein-calorie malnutrition: Secondary | ICD-10-CM | POA: Diagnosis not present

## 2021-12-24 DIAGNOSIS — C787 Secondary malignant neoplasm of liver and intrahepatic bile duct: Secondary | ICD-10-CM | POA: Diagnosis not present

## 2021-12-24 DIAGNOSIS — L8990 Pressure ulcer of unspecified site, unspecified stage: Secondary | ICD-10-CM | POA: Insufficient documentation

## 2021-12-24 DIAGNOSIS — R296 Repeated falls: Secondary | ICD-10-CM | POA: Diagnosis not present

## 2021-12-24 DIAGNOSIS — I110 Hypertensive heart disease with heart failure: Secondary | ICD-10-CM | POA: Diagnosis not present

## 2021-12-24 DIAGNOSIS — I5022 Chronic systolic (congestive) heart failure: Secondary | ICD-10-CM | POA: Diagnosis not present

## 2021-12-24 DIAGNOSIS — E1142 Type 2 diabetes mellitus with diabetic polyneuropathy: Secondary | ICD-10-CM | POA: Diagnosis not present

## 2021-12-24 DIAGNOSIS — I25118 Atherosclerotic heart disease of native coronary artery with other forms of angina pectoris: Secondary | ICD-10-CM | POA: Diagnosis not present

## 2021-12-24 DIAGNOSIS — K7689 Other specified diseases of liver: Secondary | ICD-10-CM | POA: Diagnosis not present

## 2021-12-24 DIAGNOSIS — I714 Abdominal aortic aneurysm, without rupture, unspecified: Secondary | ICD-10-CM | POA: Diagnosis not present

## 2021-12-24 DIAGNOSIS — R262 Difficulty in walking, not elsewhere classified: Secondary | ICD-10-CM | POA: Diagnosis not present

## 2021-12-24 DIAGNOSIS — I69398 Other sequelae of cerebral infarction: Secondary | ICD-10-CM | POA: Diagnosis not present

## 2021-12-24 DIAGNOSIS — I482 Chronic atrial fibrillation, unspecified: Secondary | ICD-10-CM | POA: Diagnosis not present

## 2021-12-24 LAB — RENAL FUNCTION PANEL
Albumin: 2.1 g/dL — ABNORMAL LOW (ref 3.5–5.0)
Anion gap: 4 — ABNORMAL LOW (ref 5–15)
BUN: 21 mg/dL (ref 8–23)
CO2: 27 mmol/L (ref 22–32)
Calcium: 8.1 mg/dL — ABNORMAL LOW (ref 8.9–10.3)
Chloride: 111 mmol/L (ref 98–111)
Creatinine, Ser: 1.12 mg/dL (ref 0.61–1.24)
GFR, Estimated: >60 mL/min (ref >60)
Glucose, Bld: 109 mg/dL — ABNORMAL HIGH (ref 70–99)
Phosphorus: 2.1 mg/dL — ABNORMAL LOW (ref 2.5–4.6)
Potassium: 3.8 mmol/L (ref 3.5–5.1)
Sodium: 142 mmol/L (ref 135–145)

## 2021-12-24 LAB — MAGNESIUM: Magnesium: 2 mg/dL (ref 1.7–2.4)

## 2021-12-24 LAB — GLUCOSE, CAPILLARY
Glucose-Capillary: 107 mg/dL — ABNORMAL HIGH (ref 70–99)
Glucose-Capillary: 124 mg/dL — ABNORMAL HIGH (ref 70–99)
Glucose-Capillary: 112 mg/dL — ABNORMAL HIGH (ref 70–99)

## 2021-12-24 MED ORDER — LOPERAMIDE HCL 2 MG PO CAPS
2.0000 mg | ORAL_CAPSULE | Freq: Four times a day (QID) | ORAL | Status: DC | PRN
  Filled 2021-12-24: qty 1

## 2021-12-24 MED ORDER — POTASSIUM PHOSPHATES 15 MMOLE/5ML IV SOLN
15.0000 mmol | Freq: Once | INTRAVENOUS | Status: DC
  Filled 2021-12-24: qty 5

## 2021-12-24 MED ORDER — METOPROLOL SUCCINATE ER 25 MG PO TB24: 12.5000 mg | ORAL_TABLET | Freq: Every day | ORAL | Status: DC

## 2021-12-24 MED ORDER — FUROSEMIDE 40 MG PO TABS: 20.0000 mg | ORAL_TABLET | Freq: Every day | ORAL | 3 refills | Status: DC

## 2021-12-24 MED ORDER — POTASSIUM CHLORIDE CRYS ER 20 MEQ PO TBCR: 20.0000 meq | EXTENDED_RELEASE_TABLET | Freq: Every day | ORAL | Status: DC

## 2021-12-24 MED ORDER — LOPERAMIDE HCL 2 MG PO CAPS: 2.0000 mg | ORAL_CAPSULE | Freq: Four times a day (QID) | ORAL | 0 refills | Status: AC | PRN

## 2021-12-24 NOTE — Progress Notes (Signed)
CSW spoke with Olegario Shearer at [facility] who states the facility is ready to accept the patient.  The number to call for report is [phone].  Madilyn Fireman, MSW, LCSW Transitions of Care  Clinical Social Worker II [phone]

## 2021-12-24 NOTE — Discharge Summary (Addendum)
Physician Discharge Summary  John [name]:[id number] DOB: 14-Dec-1950 DOA: 12/22/2021  PCP: [name], MD  Admit date: 12/22/2021 Discharge date: 12/24/2021  Admitted From: home Disposition:  SNF  Recommendations for Outpatient Follow-up:  Follow up with PCP in 1-2 weeks Please obtain BMP/CBC in one week  Discharge Condition:stable CODE STATUS:DNR Diet recommendation: heart healthy, carb modified  Brief/Interim Summary: 71 year old male with a history of A-fib, lung cancer, coronary artery disease, obstructive sleep apnea, recently experiencing dizziness, lightheadedness and frequent falls.  Noted to be hypokalemic with mild AKI.  Admitted for further management.  Discharge Diagnoses:  Principal Problem:   Hypokalemia Active Problems:   Malignant neoplasm of upper lobe of left lung (HCC)   COPD (chronic obstructive pulmonary disease) (HCC)   Type 2 diabetes mellitus (HCC)   CAD, multiple prior RCA PCI's. Last cath 2010, Myoview low risk Oct 2012   OSA (obstructive sleep apnea)   Mixed hyperlipidemia   Chronic systolic CHF (congestive heart failure) (HCC)   Atrial fibrillation, chronic (HCC)   Generalized weakness   Fatigue   Recurrent falls   Prolonged QT interval   Pressure injury of skin  Hypokalemia K+ was 2.7 on admission Improving with replacement Magnesium 2.1   Generalized weakness and fatigue Recurrent falls Continue fall precaution neurochecks Seen by PT/OT with recommendations for SNF   Prolonged QT interval Likely related to hypokalemia Qtc 588, but patient has a pacemaker Avoid QT prolonging drugs Magnesium 2.1    Chronic systolic CHF Echocardiogram done on on 04/18/2021 showed LVEF of 25 to 30%.  LV severely decreased function.  RWMA.  LV diastolic parameters are indeterminate.  Elevated LVEDP.  Severe hypokinesis of LV, basal mid inferoseptal wall, lateral wall.  Akinesis of the LV, basal mid inferior wall and inferolateral  wall. Continue Crestor Resume lasix on discharge at reduced dose   Acute kidney injury BUN/creatinine 28/1.47 (baseline creatinine at 1.1-1.2) Renally adjust medications, avoid nephrotoxic agents/dehydration/hypotension -Provide gentle hydration overnight with improvement of creatinine back to baseline   Chronic atrial fibrillation/flutter Continue Eliquis Continue amiodarone, mexiletine  -metoprolol discontinued due to low blood pressures  Type II DM Continue ISS and hypoglycemic protocol -has not required any basal insulin and blood sugars have been stable -will hold further basal insulin and jardiance for now -can resume these medications if blood pressures trend up   COPD Continue albuterol  Mixed hyperlipidemia Continue Crestor  Lung cancer -follow up with oncology/radiation oncology  CAD s/p stent placement Continue plavix, Crestor   Hypoalbuminemia -Albumin 1.7 -Likely related to poor p.o. intake -Provided albumin infusions  Loose stools -no recent antibiotics -no fever or leukocytosis -unlikely infectious -use imodium prn   Goals of care: -Seen by palliative care during last admission in 10/2021 -Agreed to DNR at that time and MOST form was completed  Discharge Instructions  Discharge Instructions     Diet - low sodium heart healthy   Complete by: As directed    Discharge wound care:   Complete by: As directed    Apply barrier cream and keep covered with abd pad. Frequent positional changes to redistribute weight   Increase activity slowly   Complete by: As directed       Allergies as of 12/24/2021       Reactions   Omega-3 Fatty Acids Hives, Itching   Benazepril Other (See Comments)   hyperkalemia   Fish Allergy Itching        Medication List     STOP taking these  medications    Jardiance 25 MG Tabs tablet Generic drug: empagliflozin   Levemir FlexTouch 100 UNIT/ML FlexPen Generic drug: insulin detemir   metoprolol succinate 50  MG 24 hr tablet Commonly known as: TOPROL-XL   nystatin cream Commonly known as: MYCOSTATIN       TAKE these medications    acetaminophen 500 MG tablet Commonly known as: TYLENOL Take 500 mg by mouth every 6 (six) hours as needed (pain).   albuterol 108 (90 Base) MCG/ACT inhaler Commonly known as: VENTOLIN HFA Inhale 2 puffs into the lungs every 6 (six) hours as needed for wheezing or shortness of breath.   amiodarone 400 MG tablet Commonly known as: PACERONE Take 200 mg by mouth 2 (two) times daily.   Arnuity Ellipta 200 MCG/ACT Aepb Generic drug: Fluticasone Furoate Inhale 1 puff into the lungs daily.   clopidogrel 75 MG tablet Commonly known as: PLAVIX Take 1 tablet (75 mg total) by mouth daily.   Eliquis 5 MG Tabs tablet Generic drug: apixaban TAKE 1 TABLET BY MOUTH TWICE A DAY   furosemide 40 MG tablet Commonly known as: LASIX Take 0.5 tablets (20 mg total) by mouth daily. What changed: how much to take   loperamide 2 MG capsule Commonly known as: IMODIUM Take 1 capsule (2 mg total) by mouth every 6 (six) hours as needed for diarrhea or loose stools.   mexiletine 150 MG capsule Commonly known as: MEXITIL TAKE 2 CAPSULES (300 MG TOTAL) BY MOUTH EVERY 12 (TWELVE) HOURS. What changed: Another medication with the same name was removed. Continue taking this medication, and follow the directions you see here.   montelukast 10 MG tablet Commonly known as: SINGULAIR Take 1 tablet (10 mg total) by mouth daily.   nitroGLYCERIN 0.4 MG SL tablet Commonly known as: NITROSTAT PLACE 1 TABLET (0.4 MG TOTAL) UNDER THE TONGUE EVERY 5 (FIVE) MINUTES AS NEEDED FOR CHEST PAIN.   Novofine Pen Needle 32G X 6 MM Misc Generic drug: Insulin Pen Needle 1 each by Does not apply route daily.   NovoLOG FlexPen 100 UNIT/ML FlexPen Generic drug: insulin aspart INJECT 5-20 UNITS SUBCUTANEOUSLY WITH LUNCH AND DINNER What changed:  when to take this additional instructions    OneTouch Ultra test strip Generic drug: glucose blood Use as instructed   potassium chloride SA 20 MEQ tablet Commonly known as: KLOR-CON M Take 1 tablet (20 mEq total) by mouth daily. What changed:  medication strength how much to take   rosuvastatin 40 MG tablet Commonly known as: CRESTOR TAKE 1 TABLET BY MOUTH EVERY DAY               Discharge Care Instructions  (From admission, onward)           Start     Ordered   12/24/21 0000  Discharge wound care:       Comments: Apply barrier cream and keep covered with abd pad. Frequent positional changes to redistribute weight   12/24/21 1111            Contact information for after-discharge care     [facility] .   Service: Skilled Nursing Contact information: [address] [place] [address] [phone]                    Allergies  Allergen Reactions   Omega-3 Fatty Acids Hives and Itching   Benazepril Other (See Comments)    hyperkalemia  Fish Allergy Itching    Consultations:    Procedures/Studies: DG Chest Port 1 View  Result Date: 12/22/2021 CLINICAL DATA:  Palpitations.  Pacemaker fired. EXAM: PORTABLE CHEST 1 VIEW COMPARISON:  Radiographs and CT 11/05/2021. FINDINGS: Dual lead left-sided pacemaker in place, lead tips projecting over the right atrium and ventricle. Pacemaker wires are intact. Stable mild cardiomegaly. Stable left perihilar opacity with a fiducial marker clip. No acute airspace disease. No features of pulmonary edema. Chronic elevation of left hemidiaphragm. No pleural effusion or pneumothorax. Stable osseous structures. IMPRESSION: 1. No acute findings.  Stable left perihilar opacity from prior. 2. Left-sided pacemaker in place.  Unchanged mild cardiomegaly. Electronically Signed   By: Keith Rake M.D.   On: 12/22/2021 17:08   CUP PACEART INCLINIC DEVICE CHECK  Result Date: 12/13/2021 ICD check in  clinic. Normal device function. Thresholds and sensing consistent with previous device measurements. Impedance trends stable over time. No mode switches. + VT. Histogram distribution appropriate for patient and level of activity. No changes made this session. Device programmed at appropriate safety margins. Device programmed to optimize intrinsic conduction. Estimated longevity __8.1 years_. Pt enrolled in remote follow-up. see office note for full details on his VT     Subjective: Patient reports having some loose stool yesterday and this morning. He still feels weak  Discharge Exam: Vitals:   12/23/21 2103 12/24/21 0500 12/24/21 0548 12/24/21 0732  BP: 111/63  99/63   Pulse: 73  69   Resp: 20     Temp: 98.6 F (37 C)  97.6 F (36.4 C)   TempSrc: Oral  Oral   SpO2: 95%  95% 97%  Weight:  80.6 kg    Height:        General: Pt is alert, awake, not in acute distress Cardiovascular: RRR, S1/S2 +, no rubs, no gallops Respiratory: CTA bilaterally, no wheezing, no rhonchi Abdominal: Soft, NT, ND, bowel sounds + Extremities: no edema, no cyanosis    The results of significant diagnostics from this hospitalization (including imaging, microbiology, ancillary and laboratory) are listed below for reference.     Microbiology: No results found for this or any previous visit (from the past 240 hour(s)).   Labs: BNP (last 3 results) Recent Labs    01/17/21 2233 11/05/21 0507 12/22/21 1722  BNP 169.7* 280.9* 672.0*   Basic Metabolic Panel: Recent Labs  Lab 12/21/21 1139 12/22/21 1722 12/23/21 0520 12/24/21 0603  NA 140 140 142 142  K 3.1* 2.7* 3.2* 3.8  CL 103 105 109 111  CO2 24 28 29 27   GLUCOSE 241* 93 154* 109*  BUN 28* 28* 24* 21  CREATININE 1.35* 1.47* 1.30* 1.12  CALCIUM 8.1* 8.3* 8.1* 8.1*  MG  --  2.1  --  2.0  PHOS  --   --   --  2.1*   Liver Function Tests: Recent Labs  Lab 12/23/21 0520 12/24/21 0603  AST 72*  --   ALT 46*  --   ALKPHOS 507*  --    BILITOT 0.6  --   PROT 5.0*  --   ALBUMIN 1.7* 2.1*   No results for input(s): "LIPASE", "AMYLASE" in the last 168 hours. No results for input(s): "AMMONIA" in the last 168 hours. CBC: Recent Labs  Lab 12/22/21 1722 12/23/21 0520  WBC 7.8 6.4  NEUTROABS 6.4  --   HGB 9.7* 9.0*  HCT 32.5* 31.1*  MCV 82.1 83.4  PLT 349 304   Cardiac Enzymes: No results for input(s): "  CKTOTAL", "CKMB", "CKMBINDEX", "TROPONINI" in the last 168 hours. BNP: Invalid input(s): "POCBNP" CBG: Recent Labs  Lab 12/23/21 0714 12/23/21 1113 12/23/21 1626 12/23/21 2106 12/24/21 0718  GLUCAP 119* 155* 130* 109* 107*   D-Dimer No results for input(s): "DDIMER" in the last 72 hours. Hgb A1c Recent Labs    12/23/21 0520  HGBA1C 6.3*   Lipid Profile No results for input(s): "CHOL", "HDL", "LDLCALC", "TRIG", "CHOLHDL", "LDLDIRECT" in the last 72 hours. Thyroid function studies No results for input(s): "TSH", "T4TOTAL", "T3FREE", "THYROIDAB" in the last 72 hours.  Invalid input(s): "FREET3" Anemia work up No results for input(s): "VITAMINB12", "FOLATE", "FERRITIN", "TIBC", "IRON", "RETICCTPCT" in the last 72 hours. Urinalysis    Component Value Date/Time   COLORURINE YELLOW 01/18/2021 0950   APPEARANCEUR CLEAR 01/18/2021 0950   LABSPEC 1.024 01/18/2021 0950   PHURINE 5.0 01/18/2021 0950   GLUCOSEU >=500 (A) 01/18/2021 0950   HGBUR NEGATIVE 01/18/2021 0950   BILIRUBINUR NEGATIVE 01/18/2021 0950   KETONESUR 5 (A) 01/18/2021 0950   PROTEINUR NEGATIVE 01/18/2021 0950   UROBILINOGEN 1.0 02/27/2012 0001   NITRITE NEGATIVE 01/18/2021 0950   LEUKOCYTESUR NEGATIVE 01/18/2021 0950   Sepsis Labs Recent Labs  Lab 12/22/21 1722 12/23/21 0520  WBC 7.8 6.4   Microbiology No results found for this or any previous visit (from the past 240 hour(s)).   Time coordinating discharge: 36mins  SIGNED:   Kathie Dike, MD  [facility] 12/24/2021, 11:20 AM   If 7PM-7AM, please contact  night-coverage [email or web address]

## 2021-12-26 ENCOUNTER — Other Ambulatory Visit: Payer: Self-pay | Admitting: Adult Health

## 2021-12-26 ENCOUNTER — Non-Acute Institutional Stay (SKILLED_NURSING_FACILITY): Payer: Medicare Other | Admitting: Adult Health

## 2021-12-26 ENCOUNTER — Encounter: Payer: Self-pay | Admitting: Adult Health

## 2021-12-26 DIAGNOSIS — I110 Hypertensive heart disease with heart failure: Secondary | ICD-10-CM | POA: Diagnosis not present

## 2021-12-26 DIAGNOSIS — C3412 Malignant neoplasm of upper lobe, left bronchus or lung: Secondary | ICD-10-CM

## 2021-12-26 DIAGNOSIS — E43 Unspecified severe protein-calorie malnutrition: Secondary | ICD-10-CM | POA: Insufficient documentation

## 2021-12-26 DIAGNOSIS — Z7901 Long term (current) use of anticoagulants: Secondary | ICD-10-CM

## 2021-12-26 DIAGNOSIS — E1143 Type 2 diabetes mellitus with diabetic autonomic (poly)neuropathy: Secondary | ICD-10-CM

## 2021-12-26 DIAGNOSIS — R531 Weakness: Secondary | ICD-10-CM | POA: Diagnosis not present

## 2021-12-26 DIAGNOSIS — E876 Hypokalemia: Secondary | ICD-10-CM | POA: Diagnosis not present

## 2021-12-26 DIAGNOSIS — I25118 Atherosclerotic heart disease of native coronary artery with other forms of angina pectoris: Secondary | ICD-10-CM | POA: Diagnosis not present

## 2021-12-26 DIAGNOSIS — E785 Hyperlipidemia, unspecified: Secondary | ICD-10-CM

## 2021-12-26 DIAGNOSIS — I714 Abdominal aortic aneurysm, without rupture, unspecified: Secondary | ICD-10-CM

## 2021-12-26 DIAGNOSIS — I700 Atherosclerosis of aorta: Secondary | ICD-10-CM | POA: Insufficient documentation

## 2021-12-26 DIAGNOSIS — G4733 Obstructive sleep apnea (adult) (pediatric): Secondary | ICD-10-CM

## 2021-12-26 DIAGNOSIS — R296 Repeated falls: Secondary | ICD-10-CM

## 2021-12-26 DIAGNOSIS — E1142 Type 2 diabetes mellitus with diabetic polyneuropathy: Secondary | ICD-10-CM | POA: Diagnosis not present

## 2021-12-26 DIAGNOSIS — J449 Chronic obstructive pulmonary disease, unspecified: Secondary | ICD-10-CM | POA: Diagnosis not present

## 2021-12-26 DIAGNOSIS — I482 Chronic atrial fibrillation, unspecified: Secondary | ICD-10-CM | POA: Diagnosis not present

## 2021-12-26 DIAGNOSIS — E1169 Type 2 diabetes mellitus with other specified complication: Secondary | ICD-10-CM

## 2021-12-26 DIAGNOSIS — I5022 Chronic systolic (congestive) heart failure: Secondary | ICD-10-CM

## 2021-12-26 DIAGNOSIS — Z86718 Personal history of other venous thrombosis and embolism: Secondary | ICD-10-CM

## 2021-12-26 MED ORDER — NOVOLOG FLEXPEN 100 UNIT/ML ~~LOC~~ SOPN
5.0000 [IU] | PEN_INJECTOR | Freq: Two times a day (BID) | SUBCUTANEOUS | 11 refills | Status: DC
Start: 2021-12-26 — End: 2021-12-27

## 2021-12-26 NOTE — Progress Notes (Signed)
Location:  [place] Room Number: 428J Place of Service:  SNF (31)   CODE STATUS: DNR  Allergies  Allergen Reactions   Omega-3 Fatty Acids Hives and Itching   Benazepril Other (See Comments)    hyperkalemia   Fish Allergy Itching    Chief Complaint  Patient presents with   Hospitalization Follow-up    HPI:  He is a 71 year old man who has been hospitalization 12-22-21 through 12-24-21. His medical history includes: afib; lung caner; cad; osa not on cpap. He presented to the ED after receiving a call from his cardiologist for a low k+ at 2.7.  he had increased weakness; frequent falls; dizziness and lightheadedness.  Hypokalemia improved with replacement level 3.8; mag was 2.1  Generalized weakness and fatigue: recurrent falls; will need SNF care Chronic systolic CHF: EF 68-11%; is on lasix with k+ supplement  AKI: bun creat 28/1.47 near baseline Chronic afib/a flutter: is stable He is here for short term rehab with his goal to return back home. He will continue to be followed for his chronic illnesses including:   Atrial fibrillation, chronic: Coronary artery disease of native artery of native heart with stable angina: Chronic systolic CHF (congestive heart failure) Hypertensive heart disease with chronic systolic congestive heart failure:  Past Medical History:  Diagnosis Date   Arm DVT (deep venous thromboembolism), acute, left (HCC)    while off eliquis for bronchoscopy 2022   Atrial flutter (HCC)    s/p cardioversion   Coronary artery disease    Diabetes mellitus    GERD (gastroesophageal reflux disease)    History of nuclear stress test 04/04/2011   lexiscan; mod-large in size fixed inferolateral defect (scar); non-diagnostic for ischemia; low risk scan    Hyperlipidemia    Hypertension    Ischemic cardiomyopathy    Left foot drop    r/t past disk srugery - uses Kevlar brace   Lung cancer ([place]) 04/26/2021   Myocardial infarction [facility])     posterior MI   Osteomyelitis ([place])    s/p left 2nd toe amputation in 01/2021   Pulmonary nodule    Recurrent ventricular tachycardia (HCC)    Shortness of breath    Sleep apnea    on CPAP; 04/28/2007 split-night - AHI during total sleep 44.43/hr and REM 72.56/hr    Past Surgical History:  Procedure Laterality Date   AMPUTATION TOE Left 02/10/2021   Procedure: AMPUTATION  LEFT SECOND TOE;  Surgeon: Edrick Kins, DPM;  Location: [place];  Service: Podiatry;  Laterality: Left;   [place]   BRONCHIAL BIOPSY  04/26/2021   Procedure: BRONCHIAL BIOPSIES;  Surgeon: Garner Nash, DO;  Location: [place] [facility];  Service: Pulmonary;;   BRONCHIAL BRUSHINGS  04/26/2021   Procedure: BRONCHIAL BRUSHINGS;  Surgeon: Garner Nash, DO;  Location: [place] [facility];  Service: Pulmonary;;   BRONCHIAL NEEDLE ASPIRATION BIOPSY  04/26/2021   Procedure: BRONCHIAL NEEDLE ASPIRATION BIOPSIES;  Surgeon: Garner Nash, DO;  Location: [place];  Service: Pulmonary;;   CARDIAC CATHETERIZATION  2010   6 stents total   CARDIAC CATHETERIZATION  01/2000   percutaneous transluminal coronary balloon angioplasty of mid RCA stenotic lesion   CARDIAC CATHETERIZATION  06/2006   no stenting; ischemic cardiomyopathy, EF 40-45%   CARDIOVERSION N/A 07/28/2016   Procedure: CARDIOVERSION;  Surgeon: Troy Sine, MD;  Location: [place];  Service: Cardiovascular;  Laterality: N/A;   CORONARY ANGIOPLASTY  09/1998   mid-distal RCA balloon  dilatation, 4.5 & 5.0 stents    CORONARY ANGIOPLASTY WITH STENT PLACEMENT  03/1994   angioplasty & stenting (non-DES) of circumflex/prox ramus intermedius   CORONARY ANGIOPLASTY WITH STENT PLACEMENT  10/1994   large iliac [id number] stent to RCA   CORONARY ANGIOPLASTY WITH STENT PLACEMENT  12/2002   4.10m stents x2 of RCA   CORONARY ANGIOPLASTY WITH STENT PLACEMENT  01/2005   cutting balloon arthrectomy of distal RCA & Cypher DES 3.5x13; cutting balloon arthrectomy of mid RCA with Cypher  DES 3.5x18   CORONARY ANGIOPLASTY WITH STENT PLACEMENT  11/2008   stenting of mid RCA with 4.0x130mdriver, non-DES   CORONARY BALLOON ANGIOPLASTY N/A 02/15/2021   Procedure: CORONARY BALLOON ANGIOPLASTY;  Surgeon: KeTroy SineMD;  [facility];  Service: Cardiovascular;  Laterality: N/A;   FIDUCIAL MARKER PLACEMENT  04/26/2021   Procedure: FIDUCIAL MARKER PLACEMENT;  Surgeon: IcGarner NashDO;  [facility];  Service: Pulmonary;;   ICD IMPLANT N/A 08/20/2020   Procedure: ICD IMPLANT;  Surgeon: CaConstance HawMD;  [facility];  Service: Cardiovascular;  Laterality: N/A;   INTRAVASCULAR PRESSURE WIRE/FFR STUDY N/A 03/02/2020   Procedure: INTRAVASCULAR PRESSURE WIRE/FFR STUDY;  Surgeon: HaLeonie ManMD;  [facility];  Service: Cardiovascular;  Laterality: N/A;   LEFT HEART CATH AND CORONARY ANGIOGRAPHY N/A 03/02/2020   Procedure: LEFT HEART CATH AND CORONARY ANGIOGRAPHY;  Surgeon: HaLeonie ManMD;  [facility];  Service: Cardiovascular;  Laterality: N/A;   LEFT HEART CATH AND CORONARY ANGIOGRAPHY N/A 08/19/2020   Procedure: LEFT HEART CATH AND CORONARY ANGIOGRAPHY;  Surgeon: BeLorretta HarpMD;  [facility];  Service: Cardiovascular;  Laterality: N/A;   LEFT HEART CATH AND CORONARY ANGIOGRAPHY N/A 02/15/2021   Procedure: LEFT HEART CATH AND CORONARY ANGIOGRAPHY;  Surgeon: KeTroy SineMD;  [facility];  Service: Cardiovascular;  Laterality: N/A;   LEFT HEART CATHETERIZATION WITH CORONARY ANGIOGRAM N/A 02/27/2012   Procedure: LEFT HEART CATHETERIZATION WITH CORONARY ANGIOGRAM;  Surgeon: JoLorretta HarpMD;  [facility];  Service: Cardiovascular;  Laterality: N/A;   TRANSTHORACIC ECHOCARDIOGRAM  07/29/2010   EF 50=55%, mod inf wall hypokinesis & mild post wall hypokinesis; LA mild-mod dilated; mild mitral annular calcif & mild MR; mild TR & elevated RV  systolic pressure; AV mildly sclerotic; mild aortic root dilatation    V TACH ABLATION N/A 01/20/2021   Procedure: V TACH ABLATION;  Surgeon: LaVickie EpleyMD;  [facility];  Service: Cardiovascular;  Laterality: N/A;   VIDEO BRONCHOSCOPY WITH ENDOBRONCHIAL NAVIGATION Left 04/26/2021   Procedure: VIDEO BRONCHOSCOPY WITH ENDOBRONCHIAL NAVIGATION;  Surgeon: IcGarner NashDO;  [facility] Service: Pulmonary;  Laterality: Left;  ION w/ fiducial   VIDEO BRONCHOSCOPY WITH RADIAL ENDOBRONCHIAL ULTRASOUND  04/26/2021   Procedure: RADIAL ENDOBRONCHIAL ULTRASOUND;  Surgeon: IcGarner NashDO;  [facility];  Service: Pulmonary;;    Social History   Socioeconomic History   Marital status: Married    Spouse name: BeInez Catalina Number of children: 3   Years of education: Not on file   Highest education level: Not on file  Occupational History   Occupation: maBest boyOTHER    [place] [employer] [place]  Tobacco Use   Smoking status: Former    Packs/day: 1.00    Years: 50.00  Total pack years: 50.00    Types: Cigarettes    Quit date: 07/20/2016    Years since quitting: 5.4   Smokeless tobacco: Never  Vaping Use   Vaping Use: Never used  Substance and Sexual Activity   Alcohol use: Not Currently    Alcohol/week: 0.0 standard drinks of alcohol   Drug use: No   Sexual activity: Yes  Other Topics Concern   Not on file  Social History Narrative   Married x 38 years.   Social Determinants of Health   Financial Resource Strain: Low Risk  (07/21/2021)   Overall Financial Resource Strain (CARDIA)    Difficulty of Paying Living Expenses: Not hard at all  Food Insecurity: No Food Insecurity (07/21/2021)   Hunger Vital Sign    Worried About Running Out of Food in the Last Year: Never true    Ran Out of Food in the Last Year: Never true  Transportation Needs: No Transportation Needs (07/21/2021)   PRAPARE - Radiographer, therapeutic (Medical): No    Lack of Transportation (Non-Medical): No  Physical Activity: Inactive (07/21/2021)   Exercise Vital Sign    Days of Exercise per Week: 0 days    Minutes of Exercise per Session: 0 min  Stress: No Stress Concern Present (07/21/2021)   [place]    Feeling of Stress : Not at all  Social Connections: Moderately Isolated (07/21/2021)   Social Connection and Isolation Panel [NHANES]    Frequency of Communication with Friends and Family: Once a week    Frequency of Social Gatherings with Friends and Family: More than three times a week    Attends Religious Services: Never    Marine scientist or Organizations: No    Attends Archivist Meetings: Never    Marital Status: Married  Human resources officer Violence: Not At Risk (07/21/2021)   Humiliation, Afraid, Rape, and Kick questionnaire    Fear of Current or Ex-Partner: No    Emotionally Abused: No    Physically Abused: No    Sexually Abused: No   Family History  Problem Relation Age of Onset   Bone cancer Mother    Heart attack Father       VITAL SIGNS BP 108/72   Pulse 70   Temp 98 F (36.7 C)   Resp 20   Ht 6' (1.829 m)   Wt 175 lb 9.6 oz (79.7 kg)   SpO2 92%   BMI 23.82 kg/m   Outpatient Encounter Medications as of 12/26/2021  Medication Sig   acetaminophen (TYLENOL) 500 MG tablet Take 500 mg by mouth every 6 (six) hours as needed (pain).   albuterol (VENTOLIN HFA) 108 (90 Base) MCG/ACT inhaler Inhale 2 puffs into the lungs every 6 (six) hours as needed for wheezing or shortness of breath.   amiodarone (PACERONE) 400 MG tablet Take 200 mg by mouth 2 (two) times daily.   clopidogrel (PLAVIX) 75 MG tablet Take 1 tablet (75 mg total) by mouth daily.   ELIQUIS 5 MG TABS tablet TAKE 1 TABLET BY MOUTH TWICE A DAY   Fluticasone Furoate (ARNUITY ELLIPTA) 200 MCG/ACT AEPB Inhale 1 puff into the lungs daily.   furosemide (LASIX)  20 MG tablet Take 20 mg by mouth daily.   insulin aspart (NOVOLOG FLEXPEN) 100 UNIT/ML FlexPen Inject 5 Units into the skin in the morning and at bedtime. GIVE 5 UNITS WITH LUNCH AND SUPPER FOR CBG >  150   Insulin Pen Needle (NOVOFINE PEN NEEDLE) 32G X 6 MM MISC 1 each by Does not apply route daily.   loperamide (IMODIUM) 2 MG capsule Take 1 capsule (2 mg total) by mouth every 6 (six) hours as needed for diarrhea or loose stools.   mexiletine (MEXITIL) 150 MG capsule TAKE 2 CAPSULES (300 MG TOTAL) BY MOUTH EVERY 12 (TWELVE) HOURS.   montelukast (SINGULAIR) 10 MG tablet Take 1 tablet (10 mg total) by mouth daily.   nitroGLYCERIN (NITROSTAT) 0.4 MG SL tablet PLACE 1 TABLET (0.4 MG TOTAL) UNDER THE TONGUE EVERY 5 (FIVE) MINUTES AS NEEDED FOR CHEST PAIN.   potassium chloride (KLOR-CON M) 20 MEQ tablet Take 1 tablet (20 mEq total) by mouth daily.   rosuvastatin (CRESTOR) 40 MG tablet TAKE 1 TABLET BY MOUTH EVERY DAY   No facility-administered encounter medications on file as of 12/26/2021.     SIGNIFICANT DIAGNOSTIC EXAMS  REVIEWED TODAY  11-05-21: ct of chest 1. The masslike opacity in the left upper lobe identified on today's x-ray is new compared to August 02, 2021 and obscures the site of the previously treated malignancy. The overall patchy appearance, lack of masslike findings, and presence of air bronchograms suggests this finding is not likely to represent recurrence. Radiation changes are most likely. Pneumonia could have this appearance but is less likely unless the patient has acute symptoms of pneumonia. Recommend attention on short-term follow-up. 2. 2 new nodular regions in the left lower lobe. These findings could represent metastatic disease, developing infiltrate, or focal atelectasis. Recommend attention on short-term follow-up. 3. New masses in the liver are very concerning for metastatic disease given history and development since February 2023. 4. Healing fractures of the medial  clavicles new since February 2023. The sclerosis in multiple adjacent left anterolateral ribs are likely healing fractures as well. 5. Aneurysmal dilatation of the ascending thoracic aorta measures 4.5 cm, stable. 6. Calcified atherosclerotic changes in the thoracic aorta. Coronary artery disease, unchanged. Aortic Atherosclerosis   LABS REVIEWED TODAY;   12-13-21: tsh 1.500 12-22-21: wbc 7.8; hgb 9.7; hct 32.5; mcv 82.1 plt 349; glucose 93; bun 28; creat 1.47; k+ 2.7; na++ 140; ca 8.3; gfr 50; mag 2.1; bmp 355.0 12-23-21: wbc 6.4; hgb 9.0 ;hct 31.1; mcv 83.4 plt 304; glucose 154; bun 24; creat 1.30; k+ 3.2; na++ 142; ca 8.1; gfr 58; protein 5.0 albumin 1.7; ast 72; alt 46; alk phos 507; hgb a1c 6.3 12-24-21: glucose 109; bun 21; creat 1.123; k+ 3.8; na++ 142; ca 8.1; gfr >60 phos 2.1; albumin 2.1   Review of Systems  Constitutional:  Negative for malaise/fatigue.  Respiratory:  Positive for cough. Negative for shortness of breath.   Cardiovascular:  Negative for chest pain, palpitations and leg swelling.  Gastrointestinal:  Negative for abdominal pain, constipation and heartburn.  Musculoskeletal:  Negative for back pain, joint pain and myalgias.  Skin: Negative.   Neurological:  Negative for dizziness.  Psychiatric/Behavioral:  The patient is not nervous/anxious.    Physical Exam Constitutional:      General: He is not in acute distress.    Appearance: He is well-developed. He is not diaphoretic.  Eyes:     Comments: Eye patch over left eye   Neck:     Thyroid: No thyromegaly.  Cardiovascular:     Rate and Rhythm: Normal rate and regular rhythm.     Pulses: Normal pulses.     Heart sounds: Normal heart sounds.     Comments: Status post  angio with sten placement S/p ICD placement  Pulmonary:     Effort: Pulmonary effort is normal. No respiratory distress.     Breath sounds: Rhonchi present.  Abdominal:     General: Bowel sounds are normal. There is no distension.     Palpations:  Abdomen is soft.     Tenderness: There is no abdominal tenderness.  Musculoskeletal:        General: Normal range of motion.     Cervical back: Neck supple.     Right lower leg: No edema.     Left lower leg: No edema.     Comments: Status post amputation left second toe    Lymphadenopathy:     Cervical: No cervical adenopathy.  Skin:    General: Skin is warm and dry.  Neurological:     Mental Status: He is alert and oriented to person, place, and time.  Psychiatric:        Mood and Affect: Mood normal.       ASSESSMENT/ PLAN:  TODAY  Generalized weakness/recurrent falls: will continue therapy to improve upon his level of independence with his adls and to improve upon his balance and strength  2. Atrial fibrillation, chronic: is status post ICD placement; will continue amiodarone 200 mg twic daily for rate control and is on long term eliquis 5 mg twice daily   3. Coronary artery disease of native artery of native heart with stable angina: will continue plavix 75 mg daily has prn ntg  4. Chronic systolic CHF (congestive heart failure) EF 25-30% (04-18-21) will continue lasix 20 mg daily with k+ 20 meq daily   5. Hypertensive heart disease with chronic systolic congestive heart failure: b/p 108/72 will monitor   6. Chronic obstructive pulmonary disease unspecified COPD type: will continue arnuity ellipta 200 mcg daily; has albuterol 2 puffs every 6 hours as needed singulair 10 mg daily   7. Malignant neoplasm  of upper lobe of left lung: is followed by oncology  8.  OSA (obstructive sleep apnea) does not use CPAP  9. Hypokalemia: discharge k+ 3.8 will continue k+ 20 meq daily   10. Diabetic peripheral neuropathy associated with type 2 diabetes mellitus: will continue mexitil 300 mg twice daily for neuropathic pain   11. Type 2 diabetes mellitus with peripheral autonomic neuropathy: hgb a1c 6.3 will continue novolog 5 units prior to lunch and supper for cbg >150  12.  Hyperlipidemia associated with type 2 diabetes mellitus: will continue crestor 40 mg daily   13. Current long term use of anticoagulation medication with history of deep vein thrombosis (DVT) is on long term eliquis therapy   14. Severe protein calorie malnutrition protein 5.0 albumin 2.1 will begin prostat three times daily with meals.   15. Aortic atherosclerosis ( 11-05-21 ct) is on statin  16. Abdominal aortic aneurysm without rupture unspecified  part  (AAA)     Ok Edwards NP [facility]   call [phone]

## 2021-12-27 ENCOUNTER — Non-Acute Institutional Stay (SKILLED_NURSING_FACILITY): Payer: Medicare Other | Admitting: Internal Medicine

## 2021-12-27 ENCOUNTER — Encounter: Payer: Self-pay | Admitting: Internal Medicine

## 2021-12-27 DIAGNOSIS — E876 Hypokalemia: Secondary | ICD-10-CM | POA: Diagnosis not present

## 2021-12-27 DIAGNOSIS — E43 Unspecified severe protein-calorie malnutrition: Secondary | ICD-10-CM | POA: Diagnosis not present

## 2021-12-27 DIAGNOSIS — C3412 Malignant neoplasm of upper lobe, left bronchus or lung: Secondary | ICD-10-CM | POA: Diagnosis not present

## 2021-12-27 DIAGNOSIS — R296 Repeated falls: Secondary | ICD-10-CM | POA: Diagnosis not present

## 2021-12-27 DIAGNOSIS — I10 Essential (primary) hypertension: Secondary | ICD-10-CM | POA: Diagnosis not present

## 2021-12-27 DIAGNOSIS — R9431 Abnormal electrocardiogram [ECG] [EKG]: Secondary | ICD-10-CM

## 2021-12-27 NOTE — Patient Instructions (Signed)
See assessment and plan under each diagnosis in the problem list and acutely for this visit 

## 2021-12-27 NOTE — Assessment & Plan Note (Signed)
PT/OT at SNF °

## 2021-12-27 NOTE — Assessment & Plan Note (Signed)
Cardiology will assess indications and dosage of amiodarone.

## 2021-12-27 NOTE — Assessment & Plan Note (Signed)
BP actually soft w/o antihypertensive meds. Continue to monitor especially for postural hypotension.

## 2021-12-27 NOTE — Assessment & Plan Note (Addendum)
12/22/2021 portable chest x-ray revealed stable left perihilar opacity.  Oncology and Radiation Oncology to follow up  post discharge from the SNF. CT scheduled for 02/01/22.

## 2021-12-27 NOTE — Assessment & Plan Note (Signed)
Current albumin 2.1 and total protein 5.0.  Nutritionist will consult at SNF.

## 2021-12-27 NOTE — Assessment & Plan Note (Signed)
Potassium 2.7 the time of admission for recurrent falls in the context of dizziness and lightheadedness.  Potassium at discharge 3.8.  Lasix reinitiated at lower dose.

## 2021-12-27 NOTE — Progress Notes (Signed)
NURSING HOME LOCATION:  [facility] ROOM NUMBER:  136 P  CODE STATUS:  DNR  PCP: Jenna Luo, MD  This is a comprehensive admission note to this SNFperformed on this date less than 30 days from date of admission. Included are preadmission medical/surgical history; reconciled medication list; family history; social history and comprehensive review of systems.  Corrections and additions to the records were documented. Comprehensive physical exam was also performed. Additionally a clinical summary was entered for each active diagnosis pertinent to this admission in the Problem List to enhance continuity of care.  HPI: He was hospitalized 7/6 - 12/24/2021 admitted with dizziness, lightheadedness and frequent falls in the context of documented marked hypokalemia and AKI. Also documented on EKG was prolonged QT with a value of 0.588.  This was attributed to the profound hypokalemia.  This is in the context of prior history of ablation for V. tach and permanent pacemaker placement. Potassium was noted to be 2.7 and was repleted.  Creatinine was 1.47 and GFR 50 indicating AKI stage IIIa.  Normochromic, hypochromic anemia was present with H/H of 9.7/32.5. Prior echocardiogram 04/18/2021 had revealed an EF of 25-30% with severe LV dysfunction with severe hypokinesis . AKI did respond to intervention with final creatinine of 1.12 and GFR greater than 60.  Final potassium was 3.8.After K+ was repleted Lasix was to be resumed at discharge at a reduced dose.  Anemia remained essentially stable with values of 9/31.1.  Protein/caloric malnutrition was documented with total protein of 5.0 and albumin of 2.1. Sliding scale insulin was continued for diabetes.  His glucoses ranged from a low of 107 up to high of 241 while hospitalized. 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  Oncology/Radiation Oncology follow-up will be pursued post discharge from the  SNF. Palliative Care consulted during his admission 5/23 and DNR designation was established. Because of the dizziness, frequent falls and debilitation PT/OT recommended discharge to SNF for rehab.  Past medical and surgical history: Includes history of LUE DVT, history of atrial flutter, CAD with history of MI, GERD, dyslipidemia, essential hypertension, history of ischemic cardiomyopathy, history of osteomyelitis, sleep apnea, and diabetes with vascular disease. Surgeries and procedures include amputation of left toe, cardioversion, coronary angioplasty with stent placement, coronary balloon angioplasty, ICD implant, V. tach ablation, and bronchoscopy.  Social history: Presently nondrinker; former 50-pack-year smoker.  Family history: Limited history reviewed.   Review of systems: He states "I am okay".  He indicates that his his pacemaker specialist had documented that the pacer had "went off 12 times in a row" the night prior to admission.  It also apparently did so 8 times the day of admission.  He had fallen when he received a call from that physician to go to the ED.  When asked what rhythm had been exhibited he stated "top not communicating with the bottom".   He felt a fluttering in the chest during EMS transport to the hospital.This was associated with faintness.  He does have a history of vertigo.  He did not have any visual loss but states that he had decreased focus. He states the focus issue is persistent as the left eye does not "focus with the right" prompting him to wear a patch. 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 He does validate OSA but uses a CPAP irregularly. He states that he had constipation for 2 days but that was relieved with intervention at the SNF.  Prior  to that he describes loose stool without frank diarrhea. He has chronic numbness in the left foot with foot drop.   He states that he was diagnosed with cancer of the lung 6-8 months ago and has received radiation.  CT is to be  repeated on August 16.  He believes that while hospitalized the chest x-ray showed "3 more abnormalities in the left lung." (12/22/2021 portable chest x-ray actually showed no acute changes with stable left perihilar opacity). He validates depression as "my health has gone downhill in the last 60 days and its affecting my wife's health.".  Apparently she has severe back pain which limits her ADLs.  Apparently his son and daughter are supportive of their parents.  Constitutional: No fever, significant weight change  Eyes: No redness, discharge, pain ENT/mouth: No nasal congestion, purulent discharge, earache, new change in hearing, sore throat  Cardiovascular: No chest pain,  paroxysmal nocturnal dyspnea, claudication Respiratory: No cough, sputum production, hemoptysis Gastrointestinal: No heartburn, dysphagia, abdominal pain, nausea /vomiting, rectal bleeding, melena Genitourinary: No dysuria, hematuria, pyuria, incontinence, nocturia Musculoskeletal: No joint stiffness, joint swelling Dermatologic: No rash, pruritus, change in appearance of skin Neurologic: No headache, syncope, seizures Psychiatric: No significant insomnia, anorexia Endocrine: No change in hair/skin/nails, excessive thirst, excessive hunger, excessive urination  Hematologic/lymphatic: No significant bruising, lymphadenopathy, abnormal bleeding Allergy/immunology: No itchy/watery eyes, significant sneezing, urticaria, angioedema  Physical exam:  Pertinent or positive findings: Initially he was asleep exhibiting hypopnea but no apnea or snoring.  He was noted to be moving his lips without vocalization while asleep.  He did awaken easily.  Pattern alopecia is present.  He has a beard and mustache.  Bilateral ptosis is present.  The right lower eyelid is puffy; the left lower lid exhibits some edematous change only medially.  He tends to keep the left eye closed for the most part.  He is hoarse.  He has diffuse low-grade rhonchi.   First heart sound is increased.  Pacer present on L. Posterior tibial pulses are stronger than dorsalis pedis pulses.  He has 1+ edema.  He is missing the left second toe.  He has severe deformities of the toenails.  Exfoliation is present over the right heel.  He has isolated deformities of the fingernails especially of the right hand.  Only the right index finger is devoid of deformities.  Limb atrophy is suggested.  He has severe bruising over the upper extremities with scattered eschar, more so on the left than the right upper extremity.  General appearance: no acute distress, increased work of breathing is present.   Lymphatic: No lymphadenopathy about the head, neck, axilla. Eyes: No conjunctival inflammation or lid edema is present. There is no scleral icterus. Ears:  External ear exam shows no significant lesions or deformities.   Nose:  External nasal examination shows no deformity or inflammation. Nasal mucosa are pink and moist without lesions, exudates Oral exam: Lips and gums are healthy appearing.There is no oropharyngeal erythema or exudate. Neck:  No thyromegaly, masses, tenderness noted.    Heart:  Normal rate and regular rhythm without gallop, murmur, click, rub.  Lungs: without wheezes, rales, rubs. Abdomen: Bowel sounds are normal.  Abdomen is soft and nontender with no organomegaly, hernias, masses. GU: Deferred  Extremities:  No cyanosis, clubbing. Neurologic exam:  Balance, Rhomberg, finger to nose testing could not be completed due to clinical state Skin: Warm & dry w/o tenting.  See clinical summary under each active problem in the Problem List with associated  updated therapeutic plan

## 2021-12-28 ENCOUNTER — Encounter: Payer: Medicare Other | Admitting: Physician Assistant

## 2021-12-30 ENCOUNTER — Other Ambulatory Visit: Payer: Self-pay | Admitting: *Deleted

## 2021-12-30 NOTE — Patient Outreach (Signed)
Per [place] eligible member currently resides in [name].  Screening for potential [facility] care management/care coordination needs as a benefit of [name] insurance plan and PCP.  [name] admitted to [facility] on 12/24/21 after hospitalization.  Communication sent to facility SW to inquire about transition plans and potential [facility] needs.   Will continue to follow while [name] resides in SNF.      John Rolling, MSN, RN,BSN [place] Acute Care Coordinator [phone] [facility]) [phone]  (Toll free office)

## 2022-01-02 ENCOUNTER — Encounter: Payer: Self-pay | Admitting: Adult Health

## 2022-01-02 ENCOUNTER — Non-Acute Institutional Stay (SKILLED_NURSING_FACILITY): Payer: Medicare Other | Admitting: Adult Health

## 2022-01-02 ENCOUNTER — Other Ambulatory Visit: Payer: Self-pay | Admitting: *Deleted

## 2022-01-02 ENCOUNTER — Other Ambulatory Visit (HOSPITAL_COMMUNITY)
Admission: RE | Admit: 2022-01-02 | Discharge: 2022-01-02 | Disposition: A | Discharge status: Home / Self Care | Payer: Medicare Other | Source: Skilled Nursing Facility | Attending: Adult Health | Admitting: Adult Health

## 2022-01-02 DIAGNOSIS — R748 Abnormal levels of other serum enzymes: Secondary | ICD-10-CM

## 2022-01-02 DIAGNOSIS — E1159 Type 2 diabetes mellitus with other circulatory complications: Secondary | ICD-10-CM | POA: Insufficient documentation

## 2022-01-02 LAB — CBC
HCT: 29.1 % — ABNORMAL LOW (ref 39.0–52.0)
Hemoglobin: 8.8 g/dL — ABNORMAL LOW (ref 13.0–17.0)
MCH: 24.6 pg — ABNORMAL LOW (ref 26.0–34.0)
MCHC: 30.2 g/dL (ref 30.0–36.0)
MCV: 81.5 fL (ref 80.0–100.0)
Platelets: 371 10*3/uL (ref 150–400)
RBC: 3.57 MIL/uL — ABNORMAL LOW (ref 4.22–5.81)
RDW: 17.6 % — ABNORMAL HIGH (ref 11.5–15.5)
WBC: 8.5 10*3/uL (ref 4.0–10.5)
nRBC: 0 % (ref 0.0–0.2)

## 2022-01-02 LAB — COMPREHENSIVE METABOLIC PANEL
ALT: 41 U/L (ref 0–44)
AST: 53 U/L — ABNORMAL HIGH (ref 15–41)
Albumin: 2.1 g/dL — ABNORMAL LOW (ref 3.5–5.0)
Alkaline Phosphatase: 537 U/L — ABNORMAL HIGH (ref 38–126)
Anion gap: 8 (ref 5–15)
BUN: 24 mg/dL — ABNORMAL HIGH (ref 8–23)
CO2: 26 mmol/L (ref 22–32)
Calcium: 8.4 mg/dL — ABNORMAL LOW (ref 8.9–10.3)
Chloride: 106 mmol/L (ref 98–111)
Creatinine, Ser: 1.2 mg/dL (ref 0.61–1.24)
GFR, Estimated: >60 mL/min (ref >60)
Glucose, Bld: 113 mg/dL — ABNORMAL HIGH (ref 70–99)
Potassium: 3.5 mmol/L (ref 3.5–5.1)
Sodium: 140 mmol/L (ref 135–145)
Total Bilirubin: 0.6 mg/dL (ref 0.3–1.2)
Total Protein: 5.3 g/dL — ABNORMAL LOW (ref 6.5–8.1)

## 2022-01-02 NOTE — Patient Outreach (Signed)
[facility] Coordinator follow up. [name] resides in [facility].   Update received from [place], [place] SW at [facility] reporting [name] is from home with spouse. Anticipated transition plan is for long term care.   No identifiable post SNF care coordination/care management needs at this time.   Marthenia Rolling, MSN, RN,BSN [place] Acute Care Coordinator [phone] [facility]) [phone]  (Toll free office)

## 2022-01-02 NOTE — Progress Notes (Unsigned)
Location:  [facility] Room Number: 136-P Place of Service:  SNF (31)   CODE STATUS: DNR  Allergies  Allergen Reactions   Omega-3 Fatty Acids Hives and Itching   Benazepril Other (See Comments)    hyperkalemia   Fish Allergy Itching    Chief Complaint  Patient presents with   Acute Visit    Labs follow up.    HPI:  He has a history of lung cancer. His ct scan demonstrated possible liver lesions.  He will need to be seen by oncology.   Past Medical History:  Diagnosis Date   Arm DVT (deep venous thromboembolism), acute, left (HCC)    while off eliquis for bronchoscopy 2022   Atrial flutter (HCC)    s/p cardioversion   Coronary artery disease    Diabetes mellitus    GERD (gastroesophageal reflux disease)    History of nuclear stress test 04/04/2011   lexiscan; mod-large in size fixed inferolateral defect (scar); non-diagnostic for ischemia; low risk scan    Hyperlipidemia    Hypertension    Ischemic cardiomyopathy    Left foot drop    r/t past disk srugery - uses Kevlar brace   Lung cancer ([place]) 04/26/2021   Myocardial infarction [facility])    posterior MI   Osteomyelitis ([place])    s/p left 2nd toe amputation in 01/2021   Pulmonary nodule    Recurrent ventricular tachycardia (HCC)    Shortness of breath    Sleep apnea    on CPAP; 04/28/2007 split-night - AHI during total sleep 44.43/hr and REM 72.56/hr    Past Surgical History:  Procedure Laterality Date   AMPUTATION TOE Left 02/10/2021   Procedure: AMPUTATION  LEFT SECOND TOE;  Surgeon: Edrick Kins, DPM;  Location: [facility];  Service: Podiatry;  Laterality: Left;   [place]   BRONCHIAL BIOPSY  04/26/2021   Procedure: BRONCHIAL BIOPSIES;  Surgeon: Garner Nash, DO;  Location: [place] [facility];  Service: Pulmonary;;   BRONCHIAL BRUSHINGS  04/26/2021   Procedure: BRONCHIAL BRUSHINGS;  Surgeon: Garner Nash, DO;  Location: [place] [facility];  Service: Pulmonary;;   BRONCHIAL NEEDLE  ASPIRATION BIOPSY  04/26/2021   Procedure: BRONCHIAL NEEDLE ASPIRATION BIOPSIES;  Surgeon: Garner Nash, DO;  Location: [place];  Service: Pulmonary;;   CARDIAC CATHETERIZATION  2010   6 stents total   CARDIAC CATHETERIZATION  01/2000   percutaneous transluminal coronary balloon angioplasty of mid RCA stenotic lesion   CARDIAC CATHETERIZATION  06/2006   no stenting; ischemic cardiomyopathy, EF 40-45%   CARDIOVERSION N/A 07/28/2016   Procedure: CARDIOVERSION;  Surgeon: Troy Sine, MD;  Location: [place];  Service: Cardiovascular;  Laterality: N/A;   CORONARY ANGIOPLASTY  09/1998   mid-distal RCA balloon dilatation, 4.5 & 5.0 stents    CORONARY ANGIOPLASTY WITH STENT PLACEMENT  03/1994   angioplasty & stenting (non-DES) of circumflex/prox ramus intermedius   CORONARY ANGIOPLASTY WITH STENT PLACEMENT  10/1994   large iliac [id number] stent to RCA   [place]  12/2002   4.73m stents x2 of RCA   CORONARY ANGIOPLASTY WITH STENT PLACEMENT  01/2005   cutting balloon arthrectomy of distal RCA & Cypher DES 3.5x13; cutting balloon arthrectomy of mid RCA with Cypher DES 3.5x18   CORONARY ANGIOPLASTY WITH STENT PLACEMENT  11/2008   stenting of mid RCA with 4.0x166mdriver, non-DES   CORONARY BALLOON ANGIOPLASTY N/A 02/15/2021   Procedure: CORONARY BALLOON ANGIOPLASTY;  Surgeon: KeShelva Majestic  A, MD;  Location: [place] CV LAB;  Service: Cardiovascular;  Laterality: N/A;   FIDUCIAL MARKER PLACEMENT  04/26/2021   Procedure: FIDUCIAL MARKER PLACEMENT;  Surgeon: Garner Nash, DO;  Location: [place] [facility];  Service: Pulmonary;;   ICD IMPLANT N/A 08/20/2020   Procedure: ICD IMPLANT;  Surgeon: Constance Haw, MD;  Location: [place] [facility];  Service: Cardiovascular;  Laterality: N/A;   INTRAVASCULAR PRESSURE WIRE/FFR STUDY N/A 03/02/2020   Procedure: INTRAVASCULAR PRESSURE WIRE/FFR STUDY;  Surgeon: Leonie Man, MD;  Location: [place] [facility];  Service:  Cardiovascular;  Laterality: N/A;   LEFT HEART CATH AND CORONARY ANGIOGRAPHY N/A 03/02/2020   Procedure: LEFT HEART CATH AND CORONARY ANGIOGRAPHY;  Surgeon: Leonie Man, MD;  Location: [place] [facility];  Service: Cardiovascular;  Laterality: N/A;   LEFT HEART CATH AND CORONARY ANGIOGRAPHY N/A 08/19/2020   Procedure: LEFT HEART CATH AND CORONARY ANGIOGRAPHY;  Surgeon: Lorretta Harp, MD;  Location: [place] [facility];  Service: Cardiovascular;  Laterality: N/A;   LEFT HEART CATH AND CORONARY ANGIOGRAPHY N/A 02/15/2021   Procedure: LEFT HEART CATH AND CORONARY ANGIOGRAPHY;  Surgeon: Troy Sine, MD;  Location: [place] [facility];  Service: Cardiovascular;  Laterality: N/A;   LEFT HEART CATHETERIZATION WITH CORONARY ANGIOGRAM N/A 02/27/2012   Procedure: LEFT HEART CATHETERIZATION WITH CORONARY ANGIOGRAM;  Surgeon: Lorretta Harp, MD;  Location: [facility] CATH LAB;  Service: Cardiovascular;  Laterality: N/A;   TRANSTHORACIC ECHOCARDIOGRAM  07/29/2010   EF 50=55%, mod inf wall hypokinesis & mild post wall hypokinesis; LA mild-mod dilated; mild mitral annular calcif & mild MR; mild TR & elevated RV systolic pressure; AV mildly sclerotic; mild aortic root dilatation    V TACH ABLATION N/A 01/20/2021   Procedure: V TACH ABLATION;  Surgeon: Vickie Epley, MD;  Location: [facility];  Service: Cardiovascular;  Laterality: N/A;   VIDEO BRONCHOSCOPY WITH ENDOBRONCHIAL NAVIGATION Left 04/26/2021   Procedure: VIDEO BRONCHOSCOPY WITH ENDOBRONCHIAL NAVIGATION;  Surgeon: Garner Nash, DO;  Location: [place];  Service: Pulmonary;  Laterality: Left;  ION w/ fiducial   VIDEO BRONCHOSCOPY WITH RADIAL ENDOBRONCHIAL ULTRASOUND  04/26/2021   Procedure: RADIAL ENDOBRONCHIAL ULTRASOUND;  Surgeon: Garner Nash, DO;  Location: [facility];  Service: Pulmonary;;    Social History   Socioeconomic History   Marital status: Married    Spouse name: [name]   Number of children: 3   Years of education:  Not on file   Highest education level: Not on file  Occupational History   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 [place]  Tobacco Use   Smoking status: Former    Packs/day: 1.00    Years: 50.00    Total pack years: 50.00    Types: Cigarettes    Quit date: 07/20/2016    Years since quitting: 5.4   Smokeless tobacco: Never  Vaping Use   Vaping Use: Never used  Substance and Sexual Activity   Alcohol use: Not Currently    Alcohol/week: 0.0 standard drinks of alcohol   Drug use: No   Sexual activity: Yes  Other Topics Concern   Not on file  Social History Narrative   Married x 38 years.   Social Determinants of Health   Financial Resource Strain: Low Risk  (07/21/2021)   Overall Financial Resource Strain (CARDIA)    Difficulty of Paying Living Expenses: Not hard at all  Food Insecurity: No  Food Insecurity (07/21/2021)   Hunger Vital Sign    Worried About Running Out of Food in the Last Year: Never true    Ran Out of Food in the Last Year: Never true  Transportation Needs: No Transportation Needs (07/21/2021)   PRAPARE - Hydrologist (Medical): No    Lack of Transportation (Non-Medical): No  Physical Activity: Inactive (07/21/2021)   Exercise Vital Sign    Days of Exercise per Week: 0 days    Minutes of Exercise per Session: 0 min  Stress: No Stress Concern Present (07/21/2021)   [place]    Feeling of Stress : Not at all  Social Connections: Moderately Isolated (07/21/2021)   Social Connection and Isolation Panel [NHANES]    Frequency of Communication with Friends and Family: Once a week    Frequency of Social Gatherings with Friends and Family: More than three times a week    Attends Religious Services: Never    Marine scientist or Organizations: No    Attends Archivist Meetings: Never    Marital Status: Married  Human resources officer  Violence: Not At Risk (07/21/2021)   Humiliation, Afraid, Rape, and Kick questionnaire    Fear of Current or Ex-Partner: No    Emotionally Abused: No    Physically Abused: No    Sexually Abused: No   Family History  Problem Relation Age of Onset   Bone cancer Mother    Heart attack Father       VITAL SIGNS BP 138/69   Pulse 60   Temp (!) 97.1 F (36.2 C)   Resp 18   Ht 6' (1.829 m)   Wt 173 lb (78.5 kg)   SpO2 99%   BMI 23.46 kg/m   Outpatient Encounter Medications as of 01/02/2022  Medication Sig   acetaminophen (TYLENOL) 500 MG tablet Take 500 mg by mouth every 6 (six) hours as needed (pain).   albuterol (VENTOLIN HFA) 108 (90 Base) MCG/ACT inhaler Inhale 2 puffs into the lungs every 6 (six) hours as needed for wheezing or shortness of breath.   Amino Acids-Protein Hydrolys (PRO-STAT SUGAR FREE PO) Take 30 mLs by mouth with breakfast, with lunch, and with evening meal.   amiodarone (PACERONE) 200 MG tablet Take 200 mg by mouth 2 (two) times daily.   clopidogrel (PLAVIX) 75 MG tablet Take 1 tablet (75 mg total) by mouth daily.   ELIQUIS 5 MG TABS tablet TAKE 1 TABLET BY MOUTH TWICE A DAY   Fluticasone Furoate (ARNUITY ELLIPTA) 200 MCG/ACT AEPB Inhale 1 puff into the lungs daily.   furosemide (LASIX) 20 MG tablet Take 20 mg by mouth daily.   glucose blood (ONETOUCH ULTRA) test strip Use as instructed   insulin lispro (HUMALOG) 100 UNIT/ML injection Inject 5 Units into the skin in the morning and at bedtime.   Insulin Pen Needle (NOVOFINE PEN NEEDLE) 32G X 6 MM MISC 1 each by Does not apply route daily.   loperamide (IMODIUM) 2 MG capsule Take 1 capsule (2 mg total) by mouth every 6 (six) hours as needed for diarrhea or loose stools.   mexiletine (MEXITIL) 150 MG capsule TAKE 2 CAPSULES (300 MG TOTAL) BY MOUTH EVERY 12 (TWELVE) HOURS.   montelukast (SINGULAIR) 10 MG tablet Take 1 tablet (10 mg total) by mouth daily.   nitroGLYCERIN (NITROSTAT) 0.4 MG SL tablet PLACE 1 TABLET  (0.4 MG TOTAL) UNDER THE TONGUE EVERY 5 (  FIVE) MINUTES AS NEEDED FOR CHEST PAIN.   potassium chloride (KLOR-CON M) 20 MEQ tablet Take 1 tablet (20 mEq total) by mouth daily.   rosuvastatin (CRESTOR) 40 MG tablet TAKE 1 TABLET BY MOUTH EVERY DAY   [DISCONTINUED] amiodarone (PACERONE) 400 MG tablet Take 200 mg by mouth 2 (two) times daily.   [DISCONTINUED] insulin aspart (NOVOLOG) 100 UNIT/ML injection Inject 5 Units into the skin in the morning and at bedtime. GIVE 5 UNITS WITH LUNCH AND SUPPER FOR CBG >150   No facility-administered encounter medications on file as of 01/02/2022.     SIGNIFICANT DIAGNOSTIC EXAMS  REVIEWED PREVIOUS   11-05-21: ct of chest 1. The masslike opacity in the left upper lobe identified on today's x-ray is new compared to August 02, 2021 and obscures the site of the previously treated malignancy. The overall patchy appearance, lack of masslike findings, and presence of air bronchograms suggests this finding is not likely to represent recurrence. Radiation changes are most likely. Pneumonia could have this appearance but is less likely unless the patient has acute symptoms of pneumonia. Recommend attention on short-term follow-up. 2. 2 new nodular regions in the left lower lobe. These findings could represent metastatic disease, developing infiltrate, or focal atelectasis. Recommend attention on short-term follow-up. 3. New masses in the liver are very concerning for metastatic disease given history and development since February 2023. 4. Healing fractures of the medial clavicles new since February 2023. The sclerosis in multiple adjacent left anterolateral ribs are likely healing fractures as well. 5. Aneurysmal dilatation of the ascending thoracic aorta measures 4.5 cm, stable. 6. Calcified atherosclerotic changes in the thoracic aorta. Coronary artery disease, unchanged. Aortic Atherosclerosis   NO NEW EXAMS   LABS REVIEWED  PREVIOUS    12-13-21: tsh  1.500 12-22-21: wbc 7.8; hgb 9.7; hct 32.5; mcv 82.1 plt 349; glucose 93; bun 28; creat 1.47; k+ 2.7; na++ 140; ca 8.3; gfr 50; mag 2.1; bmp 355.0 12-23-21: wbc 6.4; hgb 9.0 ;hct 31.1; mcv 83.4 plt 304; glucose 154; bun 24; creat 1.30; k+ 3.2; na++ 142; ca 8.1; gfr 58; protein 5.0 albumin 1.7; ast 72; alt 46; alk phos 507; hgb a1c 6.3 12-24-21: glucose 109; bun 21; creat 1.123; k+ 3.8; na++ 142; ca 8.1; gfr >60 phos 2.1; albumin 2.1   TODAY  01-02-22: wbc 8.5; hgb 8.8; hct 29.1; mcv 81.5 plt 371; glucose 113; bun 24; creat 1.20; k+ 3.5; na++ 140; ca 8.4; gfr>60 protein 5.3; albumin 2.1 ast 51; alk phos 537   Review of Systems  Constitutional:  Negative for malaise/fatigue.  Respiratory:  Negative for cough and shortness of breath.   Cardiovascular:  Negative for chest pain, palpitations and leg swelling.  Gastrointestinal:  Negative for abdominal pain, constipation and heartburn.  Musculoskeletal:  Negative for back pain, joint pain and myalgias.  Skin: Negative.   Neurological:  Negative for dizziness.  Psychiatric/Behavioral:  The patient is not nervous/anxious.    Physical Exam Constitutional:      General: He is not in acute distress.    Appearance: He is well-developed. He is not diaphoretic.  Eyes:     Comments: Eye patch over left eye   Neck:   Neck:     Thyroid: No thyromegaly.  Cardiovascular:     Rate and Rhythm: Normal rate and regular rhythm.     Pulses: Normal pulses.     Heart sounds: Normal heart sounds.     Comments:  Status post angio with stent placement S/p ICD  placement  Pulmonary:     Effort: Pulmonary effort is normal. No respiratory distress.     Breath sounds: Normal breath sounds.  Abdominal:     General: Bowel sounds are normal. There is no distension.     Palpations: Abdomen is soft.     Tenderness: There is no abdominal tenderness.  Musculoskeletal:        General: Normal range of motion.     Cervical back: Neck supple.     Comments: Status post  amputation left second toe     Lymphadenopathy:     Cervical: No cervical adenopathy.  Skin:    General: Skin is warm and dry.  Neurological:     Mental Status: He is alert and oriented to person, place, and time.  Psychiatric:        Mood and Affect: Mood normal.        ASSESSMENT/ PLAN:  TODAY  Elevated liver enzymes:  his ct scan did demonstrate possible liver mets; will setup ultrasound.    Ok Edwards NP [facility] Adult Medicine  call [phone]

## 2022-01-04 ENCOUNTER — Non-Acute Institutional Stay (SKILLED_NURSING_FACILITY): Payer: Medicare Other | Admitting: Adult Health

## 2022-01-04 ENCOUNTER — Encounter: Payer: Self-pay | Admitting: Adult Health

## 2022-01-04 DIAGNOSIS — C787 Secondary malignant neoplasm of liver and intrahepatic bile duct: Secondary | ICD-10-CM

## 2022-01-04 DIAGNOSIS — R748 Abnormal levels of other serum enzymes: Secondary | ICD-10-CM | POA: Insufficient documentation

## 2022-01-04 NOTE — Progress Notes (Signed)
Location:  [place] Room Number: 136/P Place of Service:  SNF (31)   CODE STATUS: DNR  Allergies  Allergen Reactions   Omega-3 Fatty Acids Hives and Itching   Benazepril Other (See Comments)    hyperkalemia   Fish Allergy Itching    Chief Complaint  Patient presents with   Acute Visit    Follow up on ultrasound     HPI:  His liver enzymes are getting worse. He had a ct scan which demonstrated new lesions in liver. He denies any nausea or vomiting. No changes in appetite.   Past Medical History:  Diagnosis Date   Arm DVT (deep venous thromboembolism), acute, left (HCC)    while off eliquis for bronchoscopy 2022   Atrial flutter (HCC)    s/p cardioversion   Coronary artery disease    Diabetes mellitus    GERD (gastroesophageal reflux disease)    History of nuclear stress test 04/04/2011   lexiscan; mod-large in size fixed inferolateral defect (scar); non-diagnostic for ischemia; low risk scan    Hyperlipidemia    Hypertension    Ischemic cardiomyopathy    Left foot drop    r/t past disk srugery - uses Kevlar brace   Lung cancer ([place]) 04/26/2021   Myocardial infarction [facility])    posterior MI   Osteomyelitis ([place])    s/p left 2nd toe amputation in 01/2021   Pulmonary nodule    Recurrent ventricular tachycardia (HCC)    Shortness of breath    Sleep apnea    on CPAP; 04/28/2007 split-night - AHI during total sleep 44.43/hr and REM 72.56/hr    Past Surgical History:  Procedure Laterality Date   AMPUTATION TOE Left 02/10/2021   Procedure: AMPUTATION  LEFT SECOND TOE;  Surgeon: Edrick Kins, DPM;  Location: [place];  Service: Podiatry;  Laterality: Left;   [place]   BRONCHIAL BIOPSY  04/26/2021   Procedure: BRONCHIAL BIOPSIES;  Surgeon: Garner Nash, DO;  Location: [place] [facility];  Service: Pulmonary;;   BRONCHIAL BRUSHINGS  04/26/2021   Procedure: BRONCHIAL BRUSHINGS;  Surgeon: Garner Nash, DO;  Location: [place] [facility];   Service: Pulmonary;;   BRONCHIAL NEEDLE ASPIRATION BIOPSY  04/26/2021   Procedure: BRONCHIAL NEEDLE ASPIRATION BIOPSIES;  Surgeon: Garner Nash, DO;  Location: [place];  Service: Pulmonary;;   CARDIAC CATHETERIZATION  2010   6 stents total   CARDIAC CATHETERIZATION  01/2000   percutaneous transluminal coronary balloon angioplasty of mid RCA stenotic lesion   CARDIAC CATHETERIZATION  06/2006   no stenting; ischemic cardiomyopathy, EF 40-45%   CARDIOVERSION N/A 07/28/2016   Procedure: CARDIOVERSION;  Surgeon: Troy Sine, MD;  Location: [place];  Service: Cardiovascular;  Laterality: N/A;   CORONARY ANGIOPLASTY  09/1998   mid-distal RCA balloon dilatation, 4.5 & 5.0 stents    CORONARY ANGIOPLASTY WITH STENT PLACEMENT  03/1994   angioplasty & stenting (non-DES) of circumflex/prox ramus intermedius   CORONARY ANGIOPLASTY WITH STENT PLACEMENT  10/1994   large iliac [id number] stent to RCA   [place]  12/2002   4.24m stents x2 of RCA   CORONARY ANGIOPLASTY WITH STENT PLACEMENT  01/2005   cutting balloon arthrectomy of distal RCA & Cypher DES 3.5x13; cutting balloon arthrectomy of mid RCA with Cypher DES 3.5x18   CORONARY ANGIOPLASTY WITH STENT PLACEMENT  11/2008   stenting of mid RCA with 4.0x113mdriver, non-DES   CORONARY BALLOON ANGIOPLASTY N/A 02/15/2021   Procedure: CORONARY  BALLOON ANGIOPLASTY;  Surgeon: Troy Sine, MD;  Location: [place] [facility];  Service: Cardiovascular;  Laterality: N/A;   FIDUCIAL MARKER PLACEMENT  04/26/2021   Procedure: FIDUCIAL MARKER PLACEMENT;  Surgeon: Garner Nash, DO;  Location: [place] [facility];  Service: Pulmonary;;   ICD IMPLANT N/A 08/20/2020   Procedure: ICD IMPLANT;  Surgeon: Constance Haw, MD;  Location: [place] [facility];  Service: Cardiovascular;  Laterality: N/A;   INTRAVASCULAR PRESSURE WIRE/FFR STUDY N/A 03/02/2020   Procedure: INTRAVASCULAR PRESSURE WIRE/FFR STUDY;  Surgeon: Leonie Man, MD;   Location: [place] [facility];  Service: Cardiovascular;  Laterality: N/A;   LEFT HEART CATH AND CORONARY ANGIOGRAPHY N/A 03/02/2020   Procedure: LEFT HEART CATH AND CORONARY ANGIOGRAPHY;  Surgeon: Leonie Man, MD;  Location: [place] [facility];  Service: Cardiovascular;  Laterality: N/A;   LEFT HEART CATH AND CORONARY ANGIOGRAPHY N/A 08/19/2020   Procedure: LEFT HEART CATH AND CORONARY ANGIOGRAPHY;  Surgeon: Lorretta Harp, MD;  Location: [place] [facility];  Service: Cardiovascular;  Laterality: N/A;   LEFT HEART CATH AND CORONARY ANGIOGRAPHY N/A 02/15/2021   Procedure: LEFT HEART CATH AND CORONARY ANGIOGRAPHY;  Surgeon: Troy Sine, MD;  Location: [place] [facility];  Service: Cardiovascular;  Laterality: N/A;   LEFT HEART CATHETERIZATION WITH CORONARY ANGIOGRAM N/A 02/27/2012   Procedure: LEFT HEART CATHETERIZATION WITH CORONARY ANGIOGRAM;  Surgeon: Lorretta Harp, MD;  Location: [facility];  Service: Cardiovascular;  Laterality: N/A;   TRANSTHORACIC ECHOCARDIOGRAM  07/29/2010   EF 50=55%, mod inf wall hypokinesis & mild post wall hypokinesis; LA mild-mod dilated; mild mitral annular calcif & mild MR; mild TR & elevated RV systolic pressure; AV mildly sclerotic; mild aortic root dilatation    V TACH ABLATION N/A 01/20/2021   Procedure: V TACH ABLATION;  Surgeon: Vickie Epley, MD;  Location: [place] [facility];  Service: Cardiovascular;  Laterality: N/A;   VIDEO BRONCHOSCOPY WITH ENDOBRONCHIAL NAVIGATION Left 04/26/2021   Procedure: VIDEO BRONCHOSCOPY WITH ENDOBRONCHIAL NAVIGATION;  Surgeon: Garner Nash, DO;  Location: [place];  Service: Pulmonary;  Laterality: Left;  ION w/ fiducial   VIDEO BRONCHOSCOPY WITH RADIAL ENDOBRONCHIAL ULTRASOUND  04/26/2021   Procedure: RADIAL ENDOBRONCHIAL ULTRASOUND;  Surgeon: Garner Nash, DO;  Location: [facility];  Service: Pulmonary;;    Social History   Socioeconomic History   Marital status: Married    Spouse name: [name]    Number of children: 3   Years of education: Not on file   Highest education level: Not on file  Occupational History   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 [place]  Tobacco Use   Smoking status: Former    Packs/day: 1.00    Years: 50.00    Total pack years: 50.00    Types: Cigarettes    Quit date: 07/20/2016    Years since quitting: 5.4   Smokeless tobacco: Never  Vaping Use   Vaping Use: Never used  Substance and Sexual Activity   Alcohol use: Not Currently    Alcohol/week: 0.0 standard drinks of alcohol   Drug use: No   Sexual activity: Yes  Other Topics Concern   Not on file  Social History Narrative   Married x 38 years.   Social Determinants of Health   Financial Resource Strain: Low Risk  (07/21/2021)   Overall Financial Resource Strain (CARDIA)    Difficulty of Paying Living Expenses: Not hard  at all  Food Insecurity: No Food Insecurity (07/21/2021)   Hunger Vital Sign    Worried About Running Out of Food in the Last Year: Never true    Ran Out of Food in the Last Year: Never true  Transportation Needs: No Transportation Needs (07/21/2021)   PRAPARE - Hydrologist (Medical): No    Lack of Transportation (Non-Medical): No  Physical Activity: Inactive (07/21/2021)   Exercise Vital Sign    Days of Exercise per Week: 0 days    Minutes of Exercise per Session: 0 min  Stress: No Stress Concern Present (07/21/2021)   [place]    Feeling of Stress : Not at all  Social Connections: Moderately Isolated (07/21/2021)   Social Connection and Isolation Panel [NHANES]    Frequency of Communication with Friends and Family: Once a week    Frequency of Social Gatherings with Friends and Family: More than three times a week    Attends Religious Services: Never    Marine scientist or Organizations: No    Attends Archivist Meetings: Never     Marital Status: Married  Human resources officer Violence: Not At Risk (07/21/2021)   Humiliation, Afraid, Rape, and Kick questionnaire    Fear of Current or Ex-Partner: No    Emotionally Abused: No    Physically Abused: No    Sexually Abused: No   Family History  Problem Relation Age of Onset   Bone cancer Mother    Heart attack Father       VITAL SIGNS BP (!) 100/52   Pulse 72   Temp (!) 97.4 F (36.3 C)   Resp 20   Ht 6' (1.829 m)   Wt 173 lb (78.5 kg)   SpO2 97%   BMI 23.46 kg/m   Outpatient Encounter Medications as of 01/04/2022  Medication Sig   acetaminophen (TYLENOL) 500 MG tablet Take 500 mg by mouth every 6 (six) hours as needed (pain).   albuterol (VENTOLIN HFA) 108 (90 Base) MCG/ACT inhaler Inhale 2 puffs into the lungs every 6 (six) hours as needed for wheezing or shortness of breath.   Amino Acids-Protein Hydrolys (PRO-STAT SUGAR FREE PO) Take 30 mLs by mouth with breakfast, with lunch, and with evening meal.   amiodarone (PACERONE) 200 MG tablet Take 200 mg by mouth 2 (two) times daily.   clopidogrel (PLAVIX) 75 MG tablet Take 1 tablet (75 mg total) by mouth daily.   ELIQUIS 5 MG TABS tablet TAKE 1 TABLET BY MOUTH TWICE A DAY   Fluticasone Furoate (ARNUITY ELLIPTA) 200 MCG/ACT AEPB Inhale 1 puff into the lungs daily.   furosemide (LASIX) 20 MG tablet Take 20 mg by mouth daily.   glucose blood (ONETOUCH ULTRA) test strip Use as instructed   insulin lispro (HUMALOG) 100 UNIT/ML injection Inject 5 Units into the skin in the morning and at bedtime.   Insulin Pen Needle (NOVOFINE PEN NEEDLE) 32G X 6 MM MISC 1 each by Does not apply route daily.   loperamide (IMODIUM) 2 MG capsule Take 1 capsule (2 mg total) by mouth every 6 (six) hours as needed for diarrhea or loose stools.   mexiletine (MEXITIL) 150 MG capsule TAKE 2 CAPSULES (300 MG TOTAL) BY MOUTH EVERY 12 (TWELVE) HOURS.   montelukast (SINGULAIR) 10 MG tablet Take 1 tablet (10 mg total) by mouth daily.   nitroGLYCERIN  (NITROSTAT) 0.4 MG SL tablet PLACE 1 TABLET (0.4  MG TOTAL) UNDER THE TONGUE EVERY 5 (FIVE) MINUTES AS NEEDED FOR CHEST PAIN.   potassium chloride (KLOR-CON M) 20 MEQ tablet Take 1 tablet (20 mEq total) by mouth daily.   rosuvastatin (CRESTOR) 40 MG tablet TAKE 1 TABLET BY MOUTH EVERY DAY   No facility-administered encounter medications on file as of 01/04/2022.     SIGNIFICANT DIAGNOSTIC EXAMS  REVIEWED PREVIOUS   11-05-21: ct of chest 1. The masslike opacity in the left upper lobe identified on today's x-ray is new compared to August 02, 2021 and obscures the site of the previously treated malignancy. The overall patchy appearance, lack of masslike findings, and presence of air bronchograms suggests this finding is not likely to represent recurrence. Radiation changes are most likely. Pneumonia could have this appearance but is less likely unless the patient has acute symptoms of pneumonia. Recommend attention on short-term follow-up. 2. 2 new nodular regions in the left lower lobe. These findings could represent metastatic disease, developing infiltrate, or focal atelectasis. Recommend attention on short-term follow-up. 3. New masses in the liver are very concerning for metastatic disease given history and development since February 2023. 4. Healing fractures of the medial clavicles new since February 2023. The sclerosis in multiple adjacent left anterolateral ribs are likely healing fractures as well. 5. Aneurysmal dilatation of the ascending thoracic aorta measures 4.5 cm, stable. 6. Calcified atherosclerotic changes in the thoracic aorta. Coronary artery disease, unchanged. Aortic Atherosclerosis   TODAY  01-04-22: liver ultrasound:  Multiple liver lesions are seen suspicious for metastatic disease   LABS REVIEWED  PREVIOUS    12-13-21: tsh 1.500 12-22-21: wbc 7.8; hgb 9.7; hct 32.5; mcv 82.1 plt 349; glucose 93; bun 28; creat 1.47; k+ 2.7; na++ 140; ca 8.3; gfr 50; mag 2.1; bmp  355.0 12-23-21: wbc 6.4; hgb 9.0 ;hct 31.1; mcv 83.4 plt 304; glucose 154; bun 24; creat 1.30; k+ 3.2; na++ 142; ca 8.1; gfr 58; protein 5.0 albumin 1.7; ast 72; alt 46; alk phos 507; hgb a1c 6.3 12-24-21: glucose 109; bun 21; creat 1.123; k+ 3.8; na++ 142; ca 8.1; gfr >60 phos 2.1; albumin 2.1   TODAY  01-02-22: wbc 8.5; hgb 8.8; hct 29.1; mcv 81.5 plt 371; glucose 113; bun 24; creat 1.20; k+ 3.5; na++ 140; ca 8.4; gfr>60 protein 5.3; albumin 2.1 ast 51; alk phos 537   Review of Systems  Constitutional:  Negative for malaise/fatigue.  Respiratory:  Negative for cough and shortness of breath.   Cardiovascular:  Negative for chest pain, palpitations and leg swelling.  Gastrointestinal:  Negative for abdominal pain, constipation and heartburn.  Musculoskeletal:  Negative for back pain, joint pain and myalgias.  Skin: Negative.   Neurological:  Negative for dizziness.  Psychiatric/Behavioral:  The patient is not nervous/anxious.     Physical Exam Constitutional:      General: He is not in acute distress.    Appearance: He is well-developed. He is not diaphoretic.  Eyes:     Comments:  Eye patch over left eye   Neck:     Thyroid: No thyromegaly.  Cardiovascular:     Rate and Rhythm: Normal rate and regular rhythm.     Pulses: Normal pulses.     Heart sounds: Normal heart sounds.     Comments: Status post angio with stent placement S/p ICD placement  Pulmonary:     Effort: Pulmonary effort is normal. No respiratory distress.     Breath sounds: Normal breath sounds.  Abdominal:     General:  Bowel sounds are normal. There is no distension.     Palpations: Abdomen is soft.     Tenderness: There is no abdominal tenderness.  Musculoskeletal:        General: Normal range of motion.     Cervical back: Neck supple.     Right lower leg: No edema.     Left lower leg: No edema.     Comments:  Status post amputation left second toe      Lymphadenopathy:     Cervical: No cervical adenopathy.   Skin:    General: Skin is warm and dry.  Neurological:     Mental Status: He is alert and oriented to person, place, and time.  Psychiatric:        Mood and Affect: Mood normal.       ASSESSMENT/ PLAN:  TODAY  Metastatic carcinoma to liver:  I have discussed the results with John [name]; and will setup oncology appointment.    Ok Edwards NP [facility]   call [phone]

## 2022-01-05 DIAGNOSIS — C787 Secondary malignant neoplasm of liver and intrahepatic bile duct: Secondary | ICD-10-CM | POA: Insufficient documentation

## 2022-01-06 ENCOUNTER — Other Ambulatory Visit: Payer: Self-pay | Admitting: Family Medicine

## 2022-01-06 ENCOUNTER — Encounter: Payer: Self-pay | Admitting: Adult Health

## 2022-01-06 ENCOUNTER — Non-Acute Institutional Stay (SKILLED_NURSING_FACILITY): Payer: Medicare Other | Admitting: Adult Health

## 2022-01-06 ENCOUNTER — Telehealth: Payer: Self-pay | Admitting: Pulmonary Disease

## 2022-01-06 DIAGNOSIS — C3412 Malignant neoplasm of upper lobe, left bronchus or lung: Secondary | ICD-10-CM

## 2022-01-06 DIAGNOSIS — C787 Secondary malignant neoplasm of liver and intrahepatic bile duct: Secondary | ICD-10-CM | POA: Diagnosis not present

## 2022-01-06 DIAGNOSIS — I700 Atherosclerosis of aorta: Secondary | ICD-10-CM | POA: Diagnosis not present

## 2022-01-06 DIAGNOSIS — I5022 Chronic systolic (congestive) heart failure: Secondary | ICD-10-CM

## 2022-01-06 NOTE — Telephone Encounter (Signed)
Requested by interface surescripts. Lorin Glass, NP not at this practice and listed as PCP. Medication discontinued 12/13/2018 and documented discontinued 12/24/21 by Pearletha Forge, MD. Requested Prescriptions  Refused Prescriptions Disp Refills  . JARDIANCE 25 MG TABS tablet [Pharmacy Med Name: JARDIANCE 25 MG TABLET] 30 tablet 0    Sig: TAKE 1 TABLET BY MOUTH EVERY DAY     Endocrinology:  Diabetes - SGLT2 Inhibitors Passed - 01/06/2022  2:07 AM      Passed - Cr in normal range and within 360 days    Creat  Date Value Ref Range Status  09/15/2019 0.96 0.70 - 1.18 mg/dL Final    Comment:    For patients >39 years of age, the reference limit for Creatinine is approximately 13% higher for people identified as African-American. .    Creatinine, Ser  Date Value Ref Range Status  01/02/2022 1.20 0.61 - 1.24 mg/dL Final         Passed - HBA1C is between 0 and 7.9 and within 180 days    Hgb A1c MFr Bld  Date Value Ref Range Status  12/23/2021 6.3 (H) 4.8 - 5.6 % Final    Comment:    (NOTE) Pre diabetes:          5.7%-6.4%  Diabetes:              >6.4%  Glycemic control for   <7.0% adults with diabetes          Passed - eGFR in normal range and within 360 days    GFR, Est African American  Date Value Ref Range Status  09/15/2019 92 > OR = 60 mL/min/1.28m Final   GFR calc Af Amer  Date Value Ref Range Status  02/28/2020 >60 >60 mL/min Final   GFR, Est Non African American  Date Value Ref Range Status  09/15/2019 80 > OR = 60 mL/min/1.730mFinal   GFR, Estimated  Date Value Ref Range Status  01/02/2022 >60 >60 mL/min Final    Comment:    (NOTE) Calculated using the CKD-EPI Creatinine Equation (2021)    eGFR  Date Value Ref Range Status  12/21/2021 55 (L) >59 mL/min/1.73 Final         Passed - Valid encounter within last 6 months    Recent Outpatient Visits          1 month ago DM type 2, goal HbA1c < 7% ([facility], Wa[facility]ammie McgeeMD   7  months ago Arm DVT (deep venous thromboembolism), acute, left ([facility]  [facility]. Jose[facility]y FrizzleMD   8 months ago DVT of left axillary vein, acute ([facility]  [facility] A, NP   10 months ago ASCVD (arteriosclerotic cardiovascular disease)   [facility]ammie McgeeMD   1 year ago Right arm pain   [facility], Wa[place]ammie McgeeMD      Future Appointments            In 4 days Icard, BrOctavio GravesDO [facility]

## 2022-01-06 NOTE — Progress Notes (Signed)
Location:  [place] Room Number: 136 Place of Service:  SNF (31)   CODE STATUS: dnr   Allergies  Allergen Reactions   Omega-3 Fatty Acids Hives and Itching   Benazepril Other (See Comments)    hyperkalemia   Fish Allergy Itching    Chief Complaint  Patient presents with   Acute Visit    Care plan meeting.     HPI:  We have come together for his care plan meeting. Family present  BIMS 13/15 mood 5/30: decreased energy; nervous at times; some depression. He is nonambulatory with one fall without injury. He requires limited to extensive assist with his adls. Occasional bladder incontinence; frequently incontinent of bowel. Dietary: weight is 173 pounds has lost weight over the past several months; June 185 pounds. appetite slightly better is on supplements  NOS CON CHO; feeds self. Therapy: site ot stand supervision to contact guard; ambulate 200 feet with contact guard indoors and out doors and stairs upper body bathing supervision lower body contact guard brp: contact guard. Does have chronic dizziness  . He will continue to be followed for his chronic illnesses including:  Aortic atherosclerosis   Chronic systolic CHF (congestive heart failure)  Malignant neoplasm of left upper lobe   Metastatic carcinoma of liver.   Past Medical History:  Diagnosis Date   Arm DVT (deep venous thromboembolism), acute, left (HCC)    while off eliquis for bronchoscopy 2022   Atrial flutter (HCC)    s/p cardioversion   Coronary artery disease    Diabetes mellitus    GERD (gastroesophageal reflux disease)    History of nuclear stress test 04/04/2011   lexiscan; mod-large in size fixed inferolateral defect (scar); non-diagnostic for ischemia; low risk scan    Hyperlipidemia    Hypertension    Ischemic cardiomyopathy    Left foot drop    r/t past disk srugery - uses Kevlar brace   Lung cancer ([place]) 04/26/2021   Myocardial infarction [facility])    posterior MI    Osteomyelitis ([place])    s/p left 2nd toe amputation in 01/2021   Pulmonary nodule    Recurrent ventricular tachycardia (HCC)    Shortness of breath    Sleep apnea    on CPAP; 04/28/2007 split-night - AHI during total sleep 44.43/hr and REM 72.56/hr    Past Surgical History:  Procedure Laterality Date   AMPUTATION TOE Left 02/10/2021   Procedure: AMPUTATION  LEFT SECOND TOE;  Surgeon: Edrick Kins, DPM;  Location: [place];  Service: Podiatry;  Laterality: Left;   [place]   BRONCHIAL BIOPSY  04/26/2021   Procedure: BRONCHIAL BIOPSIES;  Surgeon: Garner Nash, DO;  Location: [place] ENDOSCOPY;  Service: Pulmonary;;   BRONCHIAL BRUSHINGS  04/26/2021   Procedure: BRONCHIAL BRUSHINGS;  Surgeon: Garner Nash, DO;  Location: [place] [facility];  Service: Pulmonary;;   BRONCHIAL NEEDLE ASPIRATION BIOPSY  04/26/2021   Procedure: BRONCHIAL NEEDLE ASPIRATION BIOPSIES;  Surgeon: Garner Nash, DO;  Location: [place];  Service: Pulmonary;;   CARDIAC CATHETERIZATION  2010   6 stents total   CARDIAC CATHETERIZATION  01/2000   percutaneous transluminal coronary balloon angioplasty of mid RCA stenotic lesion   CARDIAC CATHETERIZATION  06/2006   no stenting; ischemic cardiomyopathy, EF 40-45%   CARDIOVERSION N/A 07/28/2016   Procedure: CARDIOVERSION;  Surgeon: Troy Sine, MD;  Location: [place];  Service: Cardiovascular;  Laterality: N/A;   CORONARY ANGIOPLASTY  09/1998   mid-distal  RCA balloon dilatation, 4.5 & 5.0 stents    CORONARY ANGIOPLASTY WITH STENT PLACEMENT  03/1994   angioplasty & stenting (non-DES) of circumflex/prox ramus intermedius   CORONARY ANGIOPLASTY WITH STENT PLACEMENT  10/1994   large iliac [id number] stent to RCA   CORONARY ANGIOPLASTY WITH STENT PLACEMENT  12/2002   4.64m stents x2 of RCA   CORONARY ANGIOPLASTY WITH STENT PLACEMENT  01/2005   cutting balloon arthrectomy of distal RCA & Cypher DES 3.5x13; cutting balloon arthrectomy of mid RCA with Cypher DES 3.5x18    CORONARY ANGIOPLASTY WITH STENT PLACEMENT  11/2008   stenting of mid RCA with 4.0x156mdriver, non-DES   CORONARY BALLOON ANGIOPLASTY N/A 02/15/2021   Procedure: CORONARY BALLOON ANGIOPLASTY;  Surgeon: KeTroy SineMD;  [facility];  Service: Cardiovascular;  Laterality: N/A;   FIDUCIAL MARKER PLACEMENT  04/26/2021   Procedure: FIDUCIAL MARKER PLACEMENT;  Surgeon: IcGarner NashDO;  [facility];  Service: Pulmonary;;   ICD IMPLANT N/A 08/20/2020   Procedure: ICD IMPLANT;  Surgeon: CaConstance HawMD;  [facility];  Service: Cardiovascular;  Laterality: N/A;   INTRAVASCULAR PRESSURE WIRE/FFR STUDY N/A 03/02/2020   Procedure: INTRAVASCULAR PRESSURE WIRE/FFR STUDY;  Surgeon: HaLeonie ManMD;  [facility];  Service: Cardiovascular;  Laterality: N/A;   LEFT HEART CATH AND CORONARY ANGIOGRAPHY N/A 03/02/2020   Procedure: LEFT HEART CATH AND CORONARY ANGIOGRAPHY;  Surgeon: HaLeonie ManMD;  [facility];  Service: Cardiovascular;  Laterality: N/A;   LEFT HEART CATH AND CORONARY ANGIOGRAPHY N/A 08/19/2020   Procedure: LEFT HEART CATH AND CORONARY ANGIOGRAPHY;  Surgeon: BeLorretta HarpMD;  [facility];  Service: Cardiovascular;  Laterality: N/A;   LEFT HEART CATH AND CORONARY ANGIOGRAPHY N/A 02/15/2021   Procedure: LEFT HEART CATH AND CORONARY ANGIOGRAPHY;  Surgeon: KeTroy SineMD;  [facility];  Service: Cardiovascular;  Laterality: N/A;   LEFT HEART CATHETERIZATION WITH CORONARY ANGIOGRAM N/A 02/27/2012   Procedure: LEFT HEART CATHETERIZATION WITH CORONARY ANGIOGRAM;  Surgeon: JoLorretta HarpMD;  [facility];  Service: Cardiovascular;  Laterality: N/A;   TRANSTHORACIC ECHOCARDIOGRAM  07/29/2010   EF 50=55%, mod inf wall hypokinesis & mild post wall hypokinesis; LA mild-mod dilated; mild mitral annular calcif & mild MR; mild TR & elevated RV systolic pressure;  AV mildly sclerotic; mild aortic root dilatation    V TACH ABLATION N/A 01/20/2021   Procedure: V TACH ABLATION;  Surgeon: LaVickie EpleyMD;  [facility];  Service: Cardiovascular;  Laterality: N/A;   VIDEO BRONCHOSCOPY WITH ENDOBRONCHIAL NAVIGATION Left 04/26/2021   Procedure: VIDEO BRONCHOSCOPY WITH ENDOBRONCHIAL NAVIGATION;  Surgeon: IcGarner NashDO;  [facility] Service: [facility];  Laterality: Left;  ION w/ fiducial   VIDEO BRONCHOSCOPY WITH RADIAL ENDOBRONCHIAL ULTRASOUND  04/26/2021   Procedure: RADIAL ENDOBRONCHIAL ULTRASOUND;  Surgeon: IcGarner NashDO;  [facility];  Service: Pulmonary;;    Social History   Socioeconomic History   Marital status: Married    Spouse name: BeInez Catalina Number of children: 3   Years of education: Not on file   Highest education level: Not on file  Occupational History   Occupation: maBest boyOTHER    [place] [employer] [place]  Tobacco Use   Smoking status: Former    Packs/day: 1.00    Years:  50.00    Total pack years: 50.00    Types: Cigarettes    Quit date: 07/20/2016    Years since quitting: 5.4   Smokeless tobacco: Never  Vaping Use   Vaping Use: Never used  Substance and Sexual Activity   Alcohol use: Not Currently    Alcohol/week: 0.0 standard drinks of alcohol   Drug use: No   Sexual activity: Yes  Other Topics Concern   Not on file  Social History Narrative   Married x 38 years.   Social Determinants of Health   Financial Resource Strain: Low Risk  (07/21/2021)   Overall Financial Resource Strain (CARDIA)    Difficulty of Paying Living Expenses: Not hard at all  Food Insecurity: No Food Insecurity (07/21/2021)   Hunger Vital Sign    Worried About Running Out of Food in the Last Year: Never true    Ran Out of Food in the Last Year: Never true  Transportation Needs: No Transportation Needs (07/21/2021)   PRAPARE - Hydrologist  (Medical): No    Lack of Transportation (Non-Medical): No  Physical Activity: Inactive (07/21/2021)   Exercise Vital Sign    Days of Exercise per Week: 0 days    Minutes of Exercise per Session: 0 min  Stress: No Stress Concern Present (07/21/2021)   [place]    Feeling of Stress : Not at all  Social Connections: Moderately Isolated (07/21/2021)   Social Connection and Isolation Panel [NHANES]    Frequency of Communication with Friends and Family: Once a week    Frequency of Social Gatherings with Friends and Family: More than three times a week    Attends Religious Services: Never    Marine scientist or Organizations: No    Attends Archivist Meetings: Never    Marital Status: Married  Human resources officer Violence: Not At Risk (07/21/2021)   Humiliation, Afraid, Rape, and Kick questionnaire    Fear of Current or Ex-Partner: No    Emotionally Abused: No    Physically Abused: No    Sexually Abused: No   Family History  Problem Relation Age of Onset   Bone cancer Mother    Heart attack Father       VITAL SIGNS BP (!) 100/52   Pulse 72   Temp (!) 97.4 F (36.3 C)   Resp 20   Ht 6' (1.829 m)   Wt 173 lb (78.5 kg)   SpO2 97%   BMI 23.46 kg/m   Outpatient Encounter Medications as of 01/06/2022  Medication Sig   acetaminophen (TYLENOL) 500 MG tablet Take 500 mg by mouth every 6 (six) hours as needed (pain).   albuterol (VENTOLIN HFA) 108 (90 Base) MCG/ACT inhaler Inhale 2 puffs into the lungs every 6 (six) hours as needed for wheezing or shortness of breath.   Amino Acids-Protein Hydrolys (PRO-STAT SUGAR FREE PO) Take 30 mLs by mouth with breakfast, with lunch, and with evening meal.   amiodarone (PACERONE) 200 MG tablet Take 200 mg by mouth 2 (two) times daily.   clopidogrel (PLAVIX) 75 MG tablet Take 1 tablet (75 mg total) by mouth daily.   ELIQUIS 5 MG TABS tablet TAKE 1 TABLET BY MOUTH TWICE A DAY    Fluticasone Furoate (ARNUITY ELLIPTA) 200 MCG/ACT AEPB Inhale 1 puff into the lungs daily.   furosemide (LASIX) 20 MG tablet Take 20 mg by mouth daily.   glucose blood (  ONETOUCH ULTRA) test strip Use as instructed   insulin lispro (HUMALOG) 100 UNIT/ML injection Inject 5 Units into the skin in the morning and at bedtime.   Insulin Pen Needle (NOVOFINE PEN NEEDLE) 32G X 6 MM MISC 1 each by Does not apply route daily.   loperamide (IMODIUM) 2 MG capsule Take 1 capsule (2 mg total) by mouth every 6 (six) hours as needed for diarrhea or loose stools.   mexiletine (MEXITIL) 150 MG capsule TAKE 2 CAPSULES (300 MG TOTAL) BY MOUTH EVERY 12 (TWELVE) HOURS.   montelukast (SINGULAIR) 10 MG tablet Take 1 tablet (10 mg total) by mouth daily.   nitroGLYCERIN (NITROSTAT) 0.4 MG SL tablet PLACE 1 TABLET (0.4 MG TOTAL) UNDER THE TONGUE EVERY 5 (FIVE) MINUTES AS NEEDED FOR CHEST PAIN.   potassium chloride (KLOR-CON M) 20 MEQ tablet Take 1 tablet (20 mEq total) by mouth daily.   rosuvastatin (CRESTOR) 40 MG tablet TAKE 1 TABLET BY MOUTH EVERY DAY   No facility-administered encounter medications on file as of 01/06/2022.     SIGNIFICANT DIAGNOSTIC EXAMS  REVIEWED PREVIOUS   11-05-21: ct of chest 1. The masslike opacity in the left upper lobe identified on today's x-ray is new compared to August 02, 2021 and obscures the site of the previously treated malignancy. The overall patchy appearance, lack of masslike findings, and presence of air bronchograms suggests this finding is not likely to represent recurrence. Radiation changes are most likely. Pneumonia could have this appearance but is less likely unless the patient has acute symptoms of pneumonia. Recommend attention on short-term follow-up. 2. 2 new nodular regions in the left lower lobe. These findings could represent metastatic disease, developing infiltrate, or focal atelectasis. Recommend attention on short-term follow-up. 3. New masses in the liver  are very concerning for metastatic disease given history and development since February 2023. 4. Healing fractures of the medial clavicles new since February 2023. The sclerosis in multiple adjacent left anterolateral ribs are likely healing fractures as well. 5. Aneurysmal dilatation of the ascending thoracic aorta measures 4.5 cm, stable. 6. Calcified atherosclerotic changes in the thoracic aorta. Coronary artery disease, unchanged. Aortic Atherosclerosis   01-04-22: liver ultrasound:  Multiple liver lesions are seen suspicious for metastatic disease   NO NEW EXAMS   LABS REVIEWED  PREVIOUS    12-13-21: tsh 1.500 12-22-21: wbc 7.8; hgb 9.7; hct 32.5; mcv 82.1 plt 349; glucose 93; bun 28; creat 1.47; k+ 2.7; na++ 140; ca 8.3; gfr 50; mag 2.1; bmp 355.0 12-23-21: wbc 6.4; hgb 9.0 ;hct 31.1; mcv 83.4 plt 304; glucose 154; bun 24; creat 1.30; k+ 3.2; na++ 142; ca 8.1; gfr 58; protein 5.0 albumin 1.7; ast 72; alt 46; alk phos 507; hgb a1c 6.3 12-24-21: glucose 109; bun 21; creat 1.123; k+ 3.8; na++ 142; ca 8.1; gfr >60 phos 2.1; albumin 2.1  01-02-22: wbc 8.5; hgb 8.8; hct 29.1; mcv 81.5 plt 371; glucose 113; bun 24; creat 1.20; k+ 3.5; na++ 140; ca 8.4; gfr>60 protein 5.3; albumin 2.1 ast 51; alk phos 537   NO NEW LABS.    Review of Systems  Constitutional:  Negative for malaise/fatigue.  Respiratory:  Negative for cough and shortness of breath.   Cardiovascular:  Negative for chest pain, palpitations and leg swelling.  Gastrointestinal:  Negative for abdominal pain, constipation and heartburn.  Musculoskeletal:  Negative for back pain, joint pain and myalgias.  Skin: Negative.   Neurological:  Negative for dizziness.  Psychiatric/Behavioral:  The patient is not nervous/anxious.  Physical Exam Constitutional:      General: He is not in acute distress.    Appearance: He is well-developed. He is not diaphoretic.  Eyes:     Comments:  Eye patch over left eye    Neck:     Thyroid: No  thyromegaly.  Cardiovascular:     Rate and Rhythm: Normal rate and regular rhythm.     Pulses: Normal pulses.     Heart sounds: Normal heart sounds.     Comments: Status post angio with stent placement S/p ICD placement  Pulmonary:     Effort: Pulmonary effort is normal. No respiratory distress.     Breath sounds: Normal breath sounds.  Abdominal:     General: Bowel sounds are normal. There is no distension.     Palpations: Abdomen is soft.     Tenderness: There is no abdominal tenderness.  Musculoskeletal:        General: Normal range of motion.     Cervical back: Neck supple.     Right lower leg: No edema.     Left lower leg: No edema.     Comments:  Status post amputation left second toe       Lymphadenopathy:     Cervical: No cervical adenopathy.  Skin:    General: Skin is warm and dry.  Neurological:     Mental Status: He is alert and oriented to person, place, and time.  Psychiatric:        Mood and Affect: Mood normal.      ASSESSMENT/ PLAN:  TODAY  Aortic atherosclerosis Chronic systolic CHF (congestive heart failure) Malignant neoplasm of left upper lobe Metastatic carcinoma of liver  Will stop crestor  Will continue current plan of care Will continue therapy as indicated His family is going to discuss his discharge options    Time spent with patient: 40 minutes: therapy; medications; dietary goals of care.    Ok Edwards NP [facility]  call [phone]

## 2022-01-09 NOTE — Telephone Encounter (Signed)
Called and got pt set up   August 9 2pm Icard  Pt is going to cancel visit for tomorrow. Nothing further needed

## 2022-01-09 NOTE — Telephone Encounter (Signed)
Patient scheduled for mychart video visit 01/10/2022. Patient is in the hospital and not able to do a mychart video visit. Would like to do a televisit with Dr. Valeta Harms. Patient phone number is [phone].

## 2022-01-09 NOTE — Telephone Encounter (Signed)
Called patient and he states that he is currently in the hospital. He states that he has a mychart visit with Dr Valeta Harms tomorrow but is wondering if it can be a telephone visit instead. He states he can not do mychart in the hospital.    Please advise sir

## 2022-01-10 ENCOUNTER — Telehealth: Payer: Medicare Other | Admitting: Pulmonary Disease

## 2022-01-10 ENCOUNTER — Other Ambulatory Visit: Payer: Self-pay | Admitting: *Deleted

## 2022-01-10 NOTE — Patient Outreach (Signed)
[facility] Post- Acute Care Coordinator follow up. Per [place] eligible member currently resides in [place].  Screening for [facility] care coordination/care management services.  Telephone call made to Demarri Elie (spouse/DPR) [phone] to discuss [facility] services. Patient identifiers confirmed. Mrs. Plessinger states she cannot bring Mr. [place] home. States due to the frequent falls at home, she is unable to assist. States she has back issues herself. Mrs. Navarrete states she does not think Mr. Befort will qualify for Medicaid. States her daughter is going to follow up with the business office. Mrs. Rijo states she has been very overwhelmed and cannot stop crying. States she thinks Mr. Ayala cancer has spread and that he has lost a lot of weight. Engineer, technical sales provided emotional support thru active listening.  Discussed writer will continue to follow along for transition plans and potential [facility] needs. Mrs. Crittendon expressed appreciation of call.   Will follow up with Marianna Fuss, SNF SW. Appears Mr. Pardon could benefit from palliative services.    Marthenia Rolling, MSN, RN,BSN [place] Acute Care Coordinator [phone] [facility]) [phone]  (Toll free office)

## 2022-01-18 ENCOUNTER — Encounter: Payer: Self-pay | Admitting: Adult Health

## 2022-01-18 ENCOUNTER — Other Ambulatory Visit: Payer: Self-pay | Admitting: Adult Health

## 2022-01-18 ENCOUNTER — Non-Acute Institutional Stay (SKILLED_NURSING_FACILITY): Payer: Medicare Other | Admitting: Adult Health

## 2022-01-18 DIAGNOSIS — I714 Abdominal aortic aneurysm, without rupture, unspecified: Secondary | ICD-10-CM

## 2022-01-18 DIAGNOSIS — I482 Chronic atrial fibrillation, unspecified: Secondary | ICD-10-CM | POA: Diagnosis not present

## 2022-01-18 DIAGNOSIS — J209 Acute bronchitis, unspecified: Secondary | ICD-10-CM

## 2022-01-18 DIAGNOSIS — I5022 Chronic systolic (congestive) heart failure: Secondary | ICD-10-CM | POA: Diagnosis not present

## 2022-01-18 DIAGNOSIS — C787 Secondary malignant neoplasm of liver and intrahepatic bile duct: Secondary | ICD-10-CM

## 2022-01-18 MED ORDER — MONTELUKAST SODIUM 10 MG PO TABS: 10.0000 mg | ORAL_TABLET | Freq: Every day | ORAL | 0 refills | Status: DC

## 2022-01-18 MED ORDER — NOVOFINE PEN NEEDLE 32G X 6 MM MISC
1.0000 | Freq: Every day | 0 refills | Status: DC
Start: 2022-01-18 — End: 2022-01-31

## 2022-01-18 MED ORDER — FUROSEMIDE 20 MG PO TABS: 20.0000 mg | ORAL_TABLET | Freq: Every day | ORAL | 0 refills | Status: DC

## 2022-01-18 MED ORDER — APIXABAN 5 MG PO TABS: 5.0000 mg | ORAL_TABLET | Freq: Two times a day (BID) | ORAL | 0 refills | Status: DC

## 2022-01-18 MED ORDER — NITROGLYCERIN 0.4 MG SL SUBL: 0.4000 mg | SUBLINGUAL_TABLET | SUBLINGUAL | 0 refills | Status: DC | PRN

## 2022-01-18 MED ORDER — CLOPIDOGREL BISULFATE 75 MG PO TABS: 75.0000 mg | ORAL_TABLET | Freq: Every day | ORAL | 0 refills | Status: DC

## 2022-01-18 MED ORDER — MEXILETINE HCL 150 MG PO CAPS
300.0000 mg | ORAL_CAPSULE | Freq: Two times a day (BID) | ORAL | 0 refills | Status: DC
Start: 2022-01-18 — End: 2022-02-09

## 2022-01-18 MED ORDER — AMIODARONE HCL 200 MG PO TABS: 200.0000 mg | ORAL_TABLET | Freq: Two times a day (BID) | ORAL | 0 refills | Status: DC

## 2022-01-18 MED ORDER — ALBUTEROL SULFATE HFA 108 (90 BASE) MCG/ACT IN AERS: 2.0000 | INHALATION_SPRAY | Freq: Four times a day (QID) | RESPIRATORY_TRACT | 0 refills | Status: AC | PRN

## 2022-01-18 MED ORDER — ARNUITY ELLIPTA 200 MCG/ACT IN AEPB: 1.0000 | INHALATION_SPRAY | Freq: Every day | RESPIRATORY_TRACT | 0 refills | Status: AC

## 2022-01-18 MED ORDER — INSULIN LISPRO 100 UNIT/ML IJ SOLN
5.0000 [IU] | Freq: Two times a day (BID) | INTRAMUSCULAR | 0 refills | Status: DC
Start: 2022-01-18 — End: 2022-01-18

## 2022-01-18 MED ORDER — POTASSIUM CHLORIDE CRYS ER 20 MEQ PO TBCR: 20.0000 meq | EXTENDED_RELEASE_TABLET | Freq: Every day | ORAL | 0 refills | Status: DC

## 2022-01-18 NOTE — Progress Notes (Signed)
No ICM remote transmission received for 01/16/2022 and next ICM transmission scheduled for 02/13/2022.

## 2022-01-18 NOTE — Telephone Encounter (Signed)
Give 5 units with lunch and supper

## 2022-01-18 NOTE — Progress Notes (Signed)
Location:  [place] Room Number: [place] of Service:  SNF [phone])  Provider: Ok Edwards np   PCP: Gerlene Fee, NP Patient Care Team: Gerlene Fee, NP as PCP - General (Geriatric Medicine) Troy Sine, MD as PCP - Cardiology (Cardiology) Constance Haw, MD as PCP - Electrophysiology (Cardiology) Kassie Mends, RN as Case Manager Nyoka Cowden Phylis Bougie, NP as Nurse Practitioner (Geriatric Medicine)  Extended Emergency Contact Information Primary Emergency Contact: Kreitz,Betty Address: [address] [place], [place] [address] Johnnette Litter of [place] Phone: [phone] Mobile Phone: [phone] Relation: Spouse Secondary Emergency Contact: Belinda Fisher, [place] [address] Johnnette Litter of [place] Phone: [phone] Mobile Phone: [phone] Relation: Son  Code Status: dnr  Goals of care:  Advanced Directive information    01/04/2022    3:05 PM  Advanced Directives  Does Patient Have a Medical Advance Directive? Yes  Type of Advance Directive Out of facility DNR (pink MOST or yellow form)  Does patient want to make changes to medical advance directive? No - Patient declined  Pre-existing out of facility DNR order (yellow form or pink MOST form) Pink MOST form placed in chart (order not valid for inpatient use)     Allergies  Allergen Reactions   Omega-3 Fatty Acids Hives and Itching   Benazepril Other (See Comments)    hyperkalemia   Fish Allergy Itching    Chief Complaint  Patient presents with   Discharge Note    HPI:  71 y.o. male  being discharged to home with home health for pt/ot. He will not need any dme. He will need his prescription medications and will need to follow up with his medical provider. He had been hospitalized for weakness; falls had significant hypokalemia supplemented. He was admitted to this facility for short term rehab. He has participated in therapy is able to  ambulate 200 feet. He is ready to discharge to home.     Past Medical History:  Diagnosis Date   Arm DVT (deep venous thromboembolism), acute, left (HCC)    while off eliquis for bronchoscopy 2022   Atrial flutter (HCC)    s/p cardioversion   Coronary artery disease    Diabetes mellitus    GERD (gastroesophageal reflux disease)    History of nuclear stress test 04/04/2011   lexiscan; mod-large in size fixed inferolateral defect (scar); non-diagnostic for ischemia; low risk scan    Hyperlipidemia    Hypertension    Ischemic cardiomyopathy    Left foot drop    r/t past disk srugery - uses Kevlar brace   Lung cancer ([place]) 04/26/2021   Myocardial infarction [facility])    posterior MI   Osteomyelitis ([place])    s/p left 2nd toe amputation in 01/2021   Pulmonary nodule    Recurrent ventricular tachycardia (HCC)    Shortness of breath    Sleep apnea    on CPAP; 04/28/2007 split-night - AHI during total sleep 44.43/hr and REM 72.56/hr    Past Surgical History:  Procedure Laterality Date   AMPUTATION TOE Left 02/10/2021   Procedure: AMPUTATION  LEFT SECOND TOE;  Surgeon: Edrick Kins, DPM;  Location: [place];  Service: Podiatry;  Laterality: Left;   [place]   BRONCHIAL BIOPSY  04/26/2021   Procedure: BRONCHIAL BIOPSIES;  Surgeon: Garner Nash, DO;  Location: [place];  Service: Pulmonary;;   BRONCHIAL BRUSHINGS  04/26/2021   Procedure: BRONCHIAL BRUSHINGS;  Surgeon: Garner Nash, DO;  Location: [facility];  Service: Pulmonary;;   BRONCHIAL NEEDLE ASPIRATION BIOPSY  04/26/2021   Procedure: BRONCHIAL NEEDLE ASPIRATION BIOPSIES;  Surgeon: Garner Nash, DO;  Location: [facility];  Service: Pulmonary;;   CARDIAC CATHETERIZATION  2010   6 stents total   CARDIAC CATHETERIZATION  01/2000   percutaneous transluminal coronary balloon angioplasty of mid RCA stenotic lesion   CARDIAC CATHETERIZATION  06/2006   no stenting; ischemic cardiomyopathy, EF 40-45%   CARDIOVERSION  N/A 07/28/2016   Procedure: CARDIOVERSION;  Surgeon: Troy Sine, MD;  Location: [place];  Service: Cardiovascular;  Laterality: N/A;   CORONARY ANGIOPLASTY  09/1998   mid-distal RCA balloon dilatation, 4.5 & 5.0 stents    CORONARY ANGIOPLASTY WITH STENT PLACEMENT  03/1994   angioplasty & stenting (non-DES) of circumflex/prox ramus intermedius   CORONARY ANGIOPLASTY WITH STENT PLACEMENT  10/1994   large iliac [id number] stent to RCA   [place]  12/2002   4.67m stents x2 of RCA   CORONARY ANGIOPLASTY WITH STENT PLACEMENT  01/2005   cutting balloon arthrectomy of distal RCA & Cypher DES 3.5x13; cutting balloon arthrectomy of mid RCA with Cypher DES 3.5x18   CORONARY ANGIOPLASTY WITH STENT PLACEMENT  11/2008   stenting of mid RCA with 4.0x1110mdriver, non-DES   CORONARY BALLOON ANGIOPLASTY N/A 02/15/2021   Procedure: CORONARY BALLOON ANGIOPLASTY;  Surgeon: KeTroy SineMD;  [facility];  Service: Cardiovascular;  Laterality: N/A;   FIDUCIAL MARKER PLACEMENT  04/26/2021   Procedure: FIDUCIAL MARKER PLACEMENT;  Surgeon: IcGarner NashDO;  [facility];  Service: Pulmonary;;   ICD IMPLANT N/A 08/20/2020   Procedure: ICD IMPLANT;  Surgeon: CaConstance HawMD;  [facility];  Service: Cardiovascular;  Laterality: N/A;   INTRAVASCULAR PRESSURE WIRE/FFR STUDY N/A 03/02/2020   Procedure: INTRAVASCULAR PRESSURE WIRE/FFR STUDY;  Surgeon: HaLeonie ManMD;  [facility];  Service: Cardiovascular;  Laterality: N/A;   LEFT HEART CATH AND CORONARY ANGIOGRAPHY N/A 03/02/2020   Procedure: LEFT HEART CATH AND CORONARY ANGIOGRAPHY;  Surgeon: HaLeonie ManMD;  [facility];  Service: Cardiovascular;  Laterality: N/A;   LEFT HEART CATH AND CORONARY ANGIOGRAPHY N/A 08/19/2020   Procedure: LEFT HEART CATH AND CORONARY ANGIOGRAPHY;  Surgeon: BeLorretta HarpMD;  [facility];   Service: Cardiovascular;  Laterality: N/A;   LEFT HEART CATH AND CORONARY ANGIOGRAPHY N/A 02/15/2021   Procedure: LEFT HEART CATH AND CORONARY ANGIOGRAPHY;  Surgeon: KeTroy SineMD;  [facility];  Service: Cardiovascular;  Laterality: N/A;   LEFT HEART CATHETERIZATION WITH CORONARY ANGIOGRAM N/A 02/27/2012   Procedure: LEFT HEART CATHETERIZATION WITH CORONARY ANGIOGRAM;  Surgeon: JoLorretta HarpMD;  [facility];  Service: Cardiovascular;  Laterality: N/A;   TRANSTHORACIC ECHOCARDIOGRAM  07/29/2010   EF 50=55%, mod inf wall hypokinesis & mild post wall hypokinesis; LA mild-mod dilated; mild mitral annular calcif & mild MR; mild TR & elevated RV systolic pressure; AV mildly sclerotic; mild aortic root dilatation    V TACH ABLATION N/A 01/20/2021   Procedure: V TACH ABLATION;  Surgeon: LaVickie EpleyMD;  [facility];  Service: Cardiovascular;  Laterality: N/A;   VIDEO BRONCHOSCOPY WITH ENDOBRONCHIAL NAVIGATION Left 04/26/2021   Procedure: VIDEO BRONCHOSCOPY WITH ENDOBRONCHIAL  NAVIGATION;  Surgeon: Garner Nash, DO;  Location: [facility] [facility];  Service: Pulmonary;  Laterality: Left;  ION w/ fiducial   VIDEO BRONCHOSCOPY WITH RADIAL ENDOBRONCHIAL ULTRASOUND  04/26/2021   Procedure: RADIAL ENDOBRONCHIAL ULTRASOUND;  Surgeon: Garner Nash, DO;  Location: [place] [facility];  Service: Pulmonary;;      reports that he quit smoking about 5 years ago. His smoking use included cigarettes. He has a 50.00 pack-year smoking history. He has never used smokeless tobacco. He reports that he does not currently use alcohol. He reports that he does not use drugs. 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 [place]  Tobacco Use    Smoking status: Former    Packs/day: 1.00    Years: 50.00    Total pack years: 50.00    Types: Cigarettes    Quit date: 07/20/2016    Years since quitting: 5.5   Smokeless tobacco: Never  Vaping Use   Vaping Use: Never used  Substance and Sexual Activity   Alcohol use: Not Currently    Alcohol/week: 0.0 standard drinks of alcohol   Drug use: No   Sexual activity: Yes  Other Topics Concern   Not on file  Social History Narrative   Married x 38 years.   Social Determinants of Health   Financial Resource Strain: Low Risk  (07/21/2021)   Overall Financial Resource Strain (CARDIA)    Difficulty of Paying Living Expenses: Not hard at all  Food Insecurity: No Food Insecurity (07/21/2021)   Hunger Vital Sign    Worried About Running Out of Food in the Last Year: Never true    Ran Out of Food in the Last Year: Never true  Transportation Needs: No Transportation Needs (07/21/2021)   PRAPARE - Hydrologist (Medical): No    Lack of Transportation (Non-Medical): No  Physical Activity: Inactive (07/21/2021)   Exercise Vital Sign    Days of Exercise per Week: 0 days    Minutes of Exercise per Session: 0 min  Stress: No Stress Concern Present (07/21/2021)   [place]    Feeling of Stress : Not at all  Social Connections: Moderately Isolated (07/21/2021)   Social Connection and Isolation Panel [NHANES]    Frequency of Communication with Friends and Family: Once a week    Frequency of Social Gatherings with Friends and Family: More than three times a week    Attends Religious Services: Never    Marine scientist or Organizations: No    Attends Archivist Meetings: Never    Marital Status: Married  Human resources officer Violence: Not At Risk (07/21/2021)   Humiliation, Afraid, Rape, and Kick questionnaire    Fear of Current or Ex-Partner: No    Emotionally Abused: No    Physically Abused: No     Sexually Abused: No   Functional Status Survey:    Allergies  Allergen Reactions   Omega-3 Fatty Acids Hives and Itching   Benazepril Other (See Comments)    hyperkalemia   Fish Allergy Itching    Pertinent  Health Maintenance Due  Topic Date Due   COLONOSCOPY (Pts 45-71yr Insurance  coverage will need to be confirmed)  Never done   URINE MICROALBUMIN  09/14/2020   OPHTHALMOLOGY EXAM  10/28/2021   FOOT EXAM  12/22/2021   INFLUENZA VACCINE  01/17/2022   HEMOGLOBIN A1C  06/25/2022    Medications: Outpatient Encounter Medications as of 01/18/2022  Medication Sig   acetaminophen (TYLENOL) 500 MG tablet Take 500 mg by mouth every 6 (six) hours as needed (pain).   albuterol (VENTOLIN HFA) 108 (90 Base) MCG/ACT inhaler Inhale 2 puffs into the lungs every 6 (six) hours as needed for wheezing or shortness of breath.   Amino Acids-Protein Hydrolys (PRO-STAT SUGAR FREE PO) Take 30 mLs by mouth with breakfast, with lunch, and with evening meal.   amiodarone (PACERONE) 200 MG tablet Take 1 tablet (200 mg total) by mouth 2 (two) times daily.   apixaban (ELIQUIS) 5 MG TABS tablet Take 1 tablet (5 mg total) by mouth 2 (two) times daily.   clopidogrel (PLAVIX) 75 MG tablet Take 1 tablet (75 mg total) by mouth daily.   Fluticasone Furoate (ARNUITY ELLIPTA) 200 MCG/ACT AEPB Inhale 1 puff into the lungs daily.   furosemide (LASIX) 20 MG tablet Take 1 tablet (20 mg total) by mouth daily.   glucose blood (ONETOUCH ULTRA) test strip Use as instructed   insulin aspart (NOVOLOG) 100 UNIT/ML injection Inject 5 Units into the skin in the morning and at bedtime.   Insulin Pen Needle (NOVOFINE PEN NEEDLE) 32G X 6 MM MISC 1 each by Does not apply route daily.   loperamide (IMODIUM) 2 MG capsule Take 1 capsule (2 mg total) by mouth every 6 (six) hours as needed for diarrhea or loose stools.   mexiletine (MEXITIL) 150 MG capsule Take 2 capsules (300 mg total) by mouth every 12 (twelve) hours.   montelukast  (SINGULAIR) 10 MG tablet Take 1 tablet (10 mg total) by mouth daily.   nitroGLYCERIN (NITROSTAT) 0.4 MG SL tablet Place 1 tablet (0.4 mg total) under the tongue every 5 (five) minutes as needed for chest pain.   potassium chloride SA (KLOR-CON M) 20 MEQ tablet Take 1 tablet (20 mEq total) by mouth daily.   No facility-administered encounter medications on file as of 01/18/2022.     Vitals:   01/18/22 1440  BP: (!) 162/70  Pulse: 78  Resp: 18  Temp: 97.8 F (36.6 C)  SpO2: 95%  Weight: 166 lb 9.6 oz (75.6 kg)  Height: 6' (1.829 m)   Body mass index is 22.6 kg/m.   REVIEWED PREVIOUS   11-05-21: ct of chest 1. The masslike opacity in the left upper lobe identified on today's x-ray is new compared to August 02, 2021 and obscures the site of the previously treated malignancy. The overall patchy appearance, lack of masslike findings, and presence of air bronchograms suggests this finding is not likely to represent recurrence. Radiation changes are most likely. Pneumonia could have this appearance but is less likely unless the patient has acute symptoms of pneumonia. Recommend attention on short-term follow-up. 2. 2 new nodular regions in the left lower lobe. These findings could represent metastatic disease, developing infiltrate, or focal atelectasis. Recommend attention on short-term follow-up. 3. New masses in the liver are very concerning for metastatic disease given history and development since February 2023. 4. Healing fractures of the medial clavicles new since February 2023. The sclerosis in multiple adjacent left anterolateral ribs are likely healing fractures as well. 5. Aneurysmal dilatation of the ascending thoracic aorta measures 4.5 cm, stable. 6. Calcified atherosclerotic  changes in the thoracic aorta. Coronary artery disease, unchanged. Aortic Atherosclerosis   01-04-22: liver ultrasound:  Multiple liver lesions are seen suspicious for metastatic disease   NO NEW EXAMS    LABS REVIEWED  PREVIOUS    12-13-21: tsh 1.500 12-22-21: wbc 7.8; hgb 9.7; hct 32.5; mcv 82.1 plt 349; glucose 93; bun 28; creat 1.47; k+ 2.7; na++ 140; ca 8.3; gfr 50; mag 2.1; bmp 355.0 12-23-21: wbc 6.4; hgb 9.0 ;hct 31.1; mcv 83.4 plt 304; glucose 154; bun 24; creat 1.30; k+ 3.2; na++ 142; ca 8.1; gfr 58; protein 5.0 albumin 1.7; ast 72; alt 46; alk phos 507; hgb a1c 6.3 12-24-21: glucose 109; bun 21; creat 1.123; k+ 3.8; na++ 142; ca 8.1; gfr >60 phos 2.1; albumin 2.1  01-02-22: wbc 8.5; hgb 8.8; hct 29.1; mcv 81.5 plt 371; glucose 113; bun 24; creat 1.20; k+ 3.5; na++ 140; ca 8.4; gfr>60 protein 5.3; albumin 2.1 ast 51; alk phos 537   NO NEW LABS.   Review of Systems  Constitutional:  Negative for malaise/fatigue.  Respiratory:  Negative for cough and shortness of breath.   Cardiovascular:  Negative for chest pain, palpitations and leg swelling.  Gastrointestinal:  Negative for abdominal pain, constipation and heartburn.  Musculoskeletal:  Negative for back pain, joint pain and myalgias.  Skin: Negative.   Neurological:  Negative for dizziness.  Psychiatric/Behavioral:  The patient is not nervous/anxious.     Physical Exam Constitutional:      General: He is not in acute distress.    Appearance: He is not diaphoretic.  Eyes:     Conjunctiva/sclera: Conjunctivae normal.  Neck:     Thyroid: No thyromegaly.     Vascular: No JVD.  Cardiovascular:     Rate and Rhythm: Normal rate and regular rhythm.     Pulses: Normal pulses.  Pulmonary:     Effort: Pulmonary effort is normal. No respiratory distress.     Breath sounds: Normal breath sounds. No wheezing.  Abdominal:     General: Bowel sounds are normal. There is no distension.     Palpations: Abdomen is soft.     Tenderness: There is no abdominal tenderness.  Musculoskeletal:        General: Normal range of motion.     Cervical back: Neck supple.     Right lower leg: No edema.     Left lower leg: No edema.     Comments:     Status post amputation left second toe        Lymphadenopathy:     Cervical: No cervical adenopathy.  Skin:    General: Skin is warm and dry.  Neurological:     Mental Status: He is alert and oriented to person, place, and time.  Psychiatric:        Mood and Affect: Mood normal.     Assessment/Plan:    Patient is being discharged with the following home health services:  pt/ot to evaluate and treat as indicated for gait balance strength adl training.   Patient is being discharged with the following durable medical equipment:  none needed   Patient has been advised to f/u with their PCP in 1-2 weeks to for a transitions of care visit.  Social services at their facility was responsible for arranging this appointment.  Pt was provided with adequate prescriptions of noncontrolled medications to reach the scheduled appointment .  For controlled substances, a limited supply was provided as appropriate for the individual patient.  If  the pt normally receives these medications from a pain clinic or has a contract with another physician, these medications should be received from that clinic or physician only).    A 30 day supply of his prescription medications have been sent to [facility]   Time spent with patient; 35 minutes: medications dme; therapy needs

## 2022-01-19 ENCOUNTER — Other Ambulatory Visit: Payer: Self-pay | Admitting: *Deleted

## 2022-01-19 NOTE — Patient Outreach (Signed)
John [name] follow up. John [name] resides in [facility].   Update received from John [name], [name] SW indicating John [name] is doing really well. Ambulating 200 ft and only requires sup-CGA for ADLs. He will transition home with spouse on Saturday, August 6th. Message sent to SNF SW to inquire about home health agency.   Will plan outreach to John [name] to discuss Star, MSN, RN,BSN [name] [phone] [facility]) [phone]  (Toll free office)

## 2022-01-20 ENCOUNTER — Telehealth: Payer: Self-pay

## 2022-01-23 ENCOUNTER — Telehealth: Payer: Self-pay | Admitting: *Deleted

## 2022-01-23 ENCOUNTER — Other Ambulatory Visit: Payer: Self-pay | Admitting: *Deleted

## 2022-01-23 DIAGNOSIS — E1142 Type 2 diabetes mellitus with diabetic polyneuropathy: Secondary | ICD-10-CM | POA: Diagnosis not present

## 2022-01-23 DIAGNOSIS — I482 Chronic atrial fibrillation, unspecified: Secondary | ICD-10-CM | POA: Diagnosis not present

## 2022-01-23 DIAGNOSIS — M21372 Foot drop, left foot: Secondary | ICD-10-CM | POA: Diagnosis not present

## 2022-01-23 DIAGNOSIS — F32A Depression, unspecified: Secondary | ICD-10-CM | POA: Diagnosis not present

## 2022-01-23 DIAGNOSIS — R42 Dizziness and giddiness: Secondary | ICD-10-CM | POA: Diagnosis not present

## 2022-01-23 DIAGNOSIS — I714 Abdominal aortic aneurysm, without rupture, unspecified: Secondary | ICD-10-CM | POA: Diagnosis not present

## 2022-01-23 DIAGNOSIS — I110 Hypertensive heart disease with heart failure: Secondary | ICD-10-CM | POA: Diagnosis not present

## 2022-01-23 DIAGNOSIS — C787 Secondary malignant neoplasm of liver and intrahepatic bile duct: Secondary | ICD-10-CM | POA: Diagnosis not present

## 2022-01-23 DIAGNOSIS — I2510 Atherosclerotic heart disease of native coronary artery without angina pectoris: Secondary | ICD-10-CM | POA: Diagnosis not present

## 2022-01-23 DIAGNOSIS — C3412 Malignant neoplasm of upper lobe, left bronchus or lung: Secondary | ICD-10-CM | POA: Diagnosis not present

## 2022-01-23 DIAGNOSIS — D630 Anemia in neoplastic disease: Secondary | ICD-10-CM | POA: Diagnosis not present

## 2022-01-23 DIAGNOSIS — S42021D Displaced fracture of shaft of right clavicle, subsequent encounter for fracture with routine healing: Secondary | ICD-10-CM | POA: Diagnosis not present

## 2022-01-23 DIAGNOSIS — S42022D Displaced fracture of shaft of left clavicle, subsequent encounter for fracture with routine healing: Secondary | ICD-10-CM | POA: Diagnosis not present

## 2022-01-23 DIAGNOSIS — I255 Ischemic cardiomyopathy: Secondary | ICD-10-CM | POA: Diagnosis not present

## 2022-01-23 DIAGNOSIS — I4892 Unspecified atrial flutter: Secondary | ICD-10-CM | POA: Diagnosis not present

## 2022-01-23 DIAGNOSIS — J449 Chronic obstructive pulmonary disease, unspecified: Secondary | ICD-10-CM | POA: Diagnosis not present

## 2022-01-23 DIAGNOSIS — I700 Atherosclerosis of aorta: Secondary | ICD-10-CM | POA: Diagnosis not present

## 2022-01-23 DIAGNOSIS — E782 Mixed hyperlipidemia: Secondary | ICD-10-CM | POA: Diagnosis not present

## 2022-01-23 DIAGNOSIS — E1151 Type 2 diabetes mellitus with diabetic peripheral angiopathy without gangrene: Secondary | ICD-10-CM | POA: Diagnosis not present

## 2022-01-23 DIAGNOSIS — E43 Unspecified severe protein-calorie malnutrition: Secondary | ICD-10-CM | POA: Diagnosis not present

## 2022-01-23 DIAGNOSIS — R296 Repeated falls: Secondary | ICD-10-CM | POA: Diagnosis not present

## 2022-01-23 DIAGNOSIS — E1143 Type 2 diabetes mellitus with diabetic autonomic (poly)neuropathy: Secondary | ICD-10-CM | POA: Diagnosis not present

## 2022-01-23 DIAGNOSIS — E1169 Type 2 diabetes mellitus with other specified complication: Secondary | ICD-10-CM | POA: Diagnosis not present

## 2022-01-23 DIAGNOSIS — I5022 Chronic systolic (congestive) heart failure: Secondary | ICD-10-CM | POA: Diagnosis not present

## 2022-01-23 DIAGNOSIS — E876 Hypokalemia: Secondary | ICD-10-CM | POA: Diagnosis not present

## 2022-01-23 NOTE — Patient Outreach (Signed)
[facility]- Acute Care Coordinator follow up.   Telephone call made to [name] at [phone] to discuss [facility] follow up. Patient identifiers confirmed. [name] reports John [name] came home on Saturday. States he has fallen since. Conversation brief. [name] will have [facility] for home health. [name] reports PT just knocked on the door.   Writer to call [name] back at later time  to discuss [facility] follow.    Marthenia Rolling, MSN, RN,BSN [place] Acute Care Coordinator [phone] [facility]) [phone]  (Toll free office)

## 2022-01-23 NOTE — Telephone Encounter (Signed)
CALLED PATIENT TO INFORM OF CT AND TELEPHONE FU, LVM FOR A RETURN CALL

## 2022-01-24 ENCOUNTER — Other Ambulatory Visit: Payer: Self-pay | Admitting: *Deleted

## 2022-01-24 ENCOUNTER — Telehealth: Payer: Self-pay

## 2022-01-24 ENCOUNTER — Ambulatory Visit (HOSPITAL_COMMUNITY): Payer: Medicare Other

## 2022-01-24 DIAGNOSIS — D630 Anemia in neoplastic disease: Secondary | ICD-10-CM | POA: Diagnosis not present

## 2022-01-24 DIAGNOSIS — S42022D Displaced fracture of shaft of left clavicle, subsequent encounter for fracture with routine healing: Secondary | ICD-10-CM | POA: Diagnosis not present

## 2022-01-24 DIAGNOSIS — C3412 Malignant neoplasm of upper lobe, left bronchus or lung: Secondary | ICD-10-CM | POA: Diagnosis not present

## 2022-01-24 DIAGNOSIS — C787 Secondary malignant neoplasm of liver and intrahepatic bile duct: Secondary | ICD-10-CM | POA: Diagnosis not present

## 2022-01-24 DIAGNOSIS — I110 Hypertensive heart disease with heart failure: Secondary | ICD-10-CM | POA: Diagnosis not present

## 2022-01-24 DIAGNOSIS — S42021D Displaced fracture of shaft of right clavicle, subsequent encounter for fracture with routine healing: Secondary | ICD-10-CM | POA: Diagnosis not present

## 2022-01-24 NOTE — Telephone Encounter (Signed)
John [name] from [facility] wanted Med clarification? Per pt, not sure if he is still taking the following: Novolog? Furosemide?  Pt also needing appt w/ Dr. However, may not be able to come to clinic. Will need to do a telehealth?  Please advice?

## 2022-01-24 NOTE — Patient Outreach (Signed)
[place] Coordinator follow up. Mr. Lopata discharged from [facility] on 01/21/22.   Returned telephone call received from Mrs. Whitenight. Patient identifiers confirmed. Explained [facility] Care Management follow up for care coordination/care management services.  Mrs. Marmolejos reports the "there are too many things going on right now" to have additional services.  States [facility] home [facility] is doing a great job in managing Mr. Heritage's care post SNF.   Will sign off. Mrs.Miceli declines [facility] services.    Marthenia Rolling, MSN, RN,BSN [place] Acute [facility] Coordinator [phone] [facility]) [phone]  (Toll free office)

## 2022-01-24 NOTE — Telephone Encounter (Signed)
Monique w/[facility], called requesting ver orders for the following:  HHPT: 2X's for 3wk 1X's for 1wk  For strengthening, endurance, gate balance, gate transfer, home exercise/safety  Request skilled nursing for  med management/wound care  Request  Assessment for pallative   'ok' on behalf of [name] for orders for [facility] care needs

## 2022-01-25 ENCOUNTER — Ambulatory Visit: Payer: Medicare Other | Admitting: Pulmonary Disease

## 2022-01-25 ENCOUNTER — Encounter: Payer: Medicare Other | Admitting: Physician Assistant

## 2022-01-25 NOTE — Telephone Encounter (Signed)
Called Monique from [facility]- advice per Dr. Benn Moulder with telephone visit.  Med list from hospital states he should be on novolog 5 units in am and with supper. And furosemide 20 mg daily. "  Monique voiced understanding.

## 2022-01-26 DIAGNOSIS — S42022D Displaced fracture of shaft of left clavicle, subsequent encounter for fracture with routine healing: Secondary | ICD-10-CM | POA: Diagnosis not present

## 2022-01-26 DIAGNOSIS — I110 Hypertensive heart disease with heart failure: Secondary | ICD-10-CM | POA: Diagnosis not present

## 2022-01-26 DIAGNOSIS — C787 Secondary malignant neoplasm of liver and intrahepatic bile duct: Secondary | ICD-10-CM | POA: Diagnosis not present

## 2022-01-26 DIAGNOSIS — C3412 Malignant neoplasm of upper lobe, left bronchus or lung: Secondary | ICD-10-CM | POA: Diagnosis not present

## 2022-01-26 DIAGNOSIS — D630 Anemia in neoplastic disease: Secondary | ICD-10-CM | POA: Diagnosis not present

## 2022-01-26 DIAGNOSIS — S42021D Displaced fracture of shaft of right clavicle, subsequent encounter for fracture with routine healing: Secondary | ICD-10-CM | POA: Diagnosis not present

## 2022-01-27 ENCOUNTER — Other Ambulatory Visit: Payer: Self-pay | Admitting: Family Medicine

## 2022-01-27 ENCOUNTER — Telehealth: Payer: Self-pay

## 2022-01-27 NOTE — Telephone Encounter (Signed)
[place] W/[facility] [phone]  Requesting  1x's for 4wk including ADL's, IADL's, therapeutic exercise/activity and functional transferring to the shower/toilet  'ok' per [name] for [facility] service

## 2022-01-27 NOTE — Telephone Encounter (Signed)
Discontinued 12/24/21 Requested Prescriptions  Pending Prescriptions Disp Refills  . JARDIANCE 25 MG TABS tablet [Pharmacy Med Name: JARDIANCE 25 MG TABLET] 30 tablet 0    Sig: TAKE 1 TABLET BY MOUTH EVERY DAY     Endocrinology:  Diabetes - SGLT2 Inhibitors Passed - 01/27/2022  2:15 AM      Passed - Cr in normal range and within 360 days    Creat  Date Value Ref Range Status  09/15/2019 0.96 0.70 - 1.18 mg/dL Final    Comment:    For patients >73 years of age, the reference limit for Creatinine is approximately 13% higher for people identified as African-American. .    Creatinine, Ser  Date Value Ref Range Status  01/02/2022 1.20 0.61 - 1.24 mg/dL Final         Passed - HBA1C is between 0 and 7.9 and within 180 days    Hgb A1c MFr Bld  Date Value Ref Range Status  12/23/2021 6.3 (H) 4.8 - 5.6 % Final    Comment:    (NOTE) Pre diabetes:          5.7%-6.4%  Diabetes:              >6.4%  Glycemic control for   <7.0% adults with diabetes          Passed - eGFR in normal range and within 360 days    GFR, Est African American  Date Value Ref Range Status  09/15/2019 92 > OR = 60 mL/min/1.77m Final   GFR calc Af Amer  Date Value Ref Range Status  02/28/2020 >60 >60 mL/min Final   GFR, Est Non African American  Date Value Ref Range Status  09/15/2019 80 > OR = 60 mL/min/1.790mFinal   GFR, Estimated  Date Value Ref Range Status  01/02/2022 >60 >60 mL/min Final    Comment:    (NOTE) Calculated using the CKD-EPI Creatinine Equation (2021)    eGFR  Date Value Ref Range Status  12/21/2021 55 (L) >59 mL/min/1.73 Final         Passed - Valid encounter within last 6 months    Recent Outpatient Visits          2 months ago DM type 2, goal HbA1c < 7% ([facility]  [place] FrizzleMD   8 months ago Arm DVT (deep venous thromboembolism), acute, left ([place]  [place]y FrizzleMD   8 months ago DVT of left  axillary vein, acute ([facility]  BrFranklinaEulogio BearNP   11 months ago ASCVD (arteriosclerotic cardiovascular disease)   [place], [place]ammie McgeeMD   1 year ago Right arm pain   [place], [place]ammie McgeeMD      Future Appointments            In 4 days [place], WaCammie McgeeMD BrStoryPEC

## 2022-01-29 ENCOUNTER — Encounter (HOSPITAL_COMMUNITY): Payer: Self-pay

## 2022-01-29 ENCOUNTER — Inpatient Hospital Stay (HOSPITAL_COMMUNITY)
Admission: EM | Admit: 2022-01-29 | Discharge: 2022-01-31 | DRG: 640 | Disposition: A | Discharge status: Other Acute Inpatient Hospital | Payer: Medicare Other | Attending: Internal Medicine | Admitting: Internal Medicine

## 2022-01-29 ENCOUNTER — Other Ambulatory Visit: Payer: Self-pay

## 2022-01-29 DIAGNOSIS — I255 Ischemic cardiomyopathy: Secondary | ICD-10-CM | POA: Diagnosis present

## 2022-01-29 DIAGNOSIS — E860 Dehydration: Secondary | ICD-10-CM | POA: Diagnosis present

## 2022-01-29 DIAGNOSIS — R627 Adult failure to thrive: Secondary | ICD-10-CM | POA: Diagnosis present

## 2022-01-29 DIAGNOSIS — I482 Chronic atrial fibrillation, unspecified: Secondary | ICD-10-CM | POA: Diagnosis present

## 2022-01-29 DIAGNOSIS — E11649 Type 2 diabetes mellitus with hypoglycemia without coma: Secondary | ICD-10-CM | POA: Diagnosis present

## 2022-01-29 DIAGNOSIS — I2510 Atherosclerotic heart disease of native coronary artery without angina pectoris: Secondary | ICD-10-CM | POA: Diagnosis present

## 2022-01-29 DIAGNOSIS — J449 Chronic obstructive pulmonary disease, unspecified: Secondary | ICD-10-CM | POA: Diagnosis present

## 2022-01-29 DIAGNOSIS — E785 Hyperlipidemia, unspecified: Secondary | ICD-10-CM | POA: Diagnosis present

## 2022-01-29 DIAGNOSIS — C3412 Malignant neoplasm of upper lobe, left bronchus or lung: Secondary | ICD-10-CM | POA: Diagnosis present

## 2022-01-29 DIAGNOSIS — R197 Diarrhea, unspecified: Secondary | ICD-10-CM | POA: Diagnosis present

## 2022-01-29 DIAGNOSIS — I252 Old myocardial infarction: Secondary | ICD-10-CM | POA: Diagnosis not present

## 2022-01-29 DIAGNOSIS — Z794 Long term (current) use of insulin: Secondary | ICD-10-CM

## 2022-01-29 DIAGNOSIS — I110 Hypertensive heart disease with heart failure: Secondary | ICD-10-CM | POA: Diagnosis present

## 2022-01-29 DIAGNOSIS — E43 Unspecified severe protein-calorie malnutrition: Secondary | ICD-10-CM | POA: Diagnosis present

## 2022-01-29 DIAGNOSIS — E1142 Type 2 diabetes mellitus with diabetic polyneuropathy: Secondary | ICD-10-CM | POA: Diagnosis present

## 2022-01-29 DIAGNOSIS — R52 Pain, unspecified: Secondary | ICD-10-CM | POA: Diagnosis not present

## 2022-01-29 DIAGNOSIS — C3490 Malignant neoplasm of unspecified part of unspecified bronchus or lung: Secondary | ICD-10-CM

## 2022-01-29 DIAGNOSIS — Z6821 Body mass index (BMI) 21.0-21.9, adult: Secondary | ICD-10-CM

## 2022-01-29 DIAGNOSIS — E1169 Type 2 diabetes mellitus with other specified complication: Secondary | ICD-10-CM | POA: Diagnosis present

## 2022-01-29 DIAGNOSIS — Z66 Do not resuscitate: Secondary | ICD-10-CM | POA: Diagnosis present

## 2022-01-29 DIAGNOSIS — Z89422 Acquired absence of other left toe(s): Secondary | ICD-10-CM

## 2022-01-29 DIAGNOSIS — R748 Abnormal levels of other serum enzymes: Secondary | ICD-10-CM | POA: Diagnosis present

## 2022-01-29 DIAGNOSIS — Z91018 Allergy to other foods: Secondary | ICD-10-CM

## 2022-01-29 DIAGNOSIS — R54 Age-related physical debility: Secondary | ICD-10-CM | POA: Diagnosis present

## 2022-01-29 DIAGNOSIS — Z7951 Long term (current) use of inhaled steroids: Secondary | ICD-10-CM

## 2022-01-29 DIAGNOSIS — Z923 Personal history of irradiation: Secondary | ICD-10-CM

## 2022-01-29 DIAGNOSIS — K219 Gastro-esophageal reflux disease without esophagitis: Secondary | ICD-10-CM | POA: Diagnosis present

## 2022-01-29 DIAGNOSIS — N179 Acute kidney failure, unspecified: Secondary | ICD-10-CM | POA: Diagnosis present

## 2022-01-29 DIAGNOSIS — I5022 Chronic systolic (congestive) heart failure: Secondary | ICD-10-CM | POA: Diagnosis present

## 2022-01-29 DIAGNOSIS — Z7902 Long term (current) use of antithrombotics/antiplatelets: Secondary | ICD-10-CM

## 2022-01-29 DIAGNOSIS — E1143 Type 2 diabetes mellitus with diabetic autonomic (poly)neuropathy: Secondary | ICD-10-CM | POA: Diagnosis not present

## 2022-01-29 DIAGNOSIS — Z86718 Personal history of other venous thrombosis and embolism: Secondary | ICD-10-CM

## 2022-01-29 DIAGNOSIS — Z8249 Family history of ischemic heart disease and other diseases of the circulatory system: Secondary | ICD-10-CM

## 2022-01-29 DIAGNOSIS — Z515 Encounter for palliative care: Secondary | ICD-10-CM

## 2022-01-29 DIAGNOSIS — L89152 Pressure ulcer of sacral region, stage 2: Secondary | ICD-10-CM | POA: Diagnosis present

## 2022-01-29 DIAGNOSIS — Z79899 Other long term (current) drug therapy: Secondary | ICD-10-CM

## 2022-01-29 DIAGNOSIS — I10 Essential (primary) hypertension: Secondary | ICD-10-CM | POA: Diagnosis present

## 2022-01-29 DIAGNOSIS — Z7189 Other specified counseling: Secondary | ICD-10-CM | POA: Diagnosis not present

## 2022-01-29 DIAGNOSIS — C787 Secondary malignant neoplasm of liver and intrahepatic bile duct: Secondary | ICD-10-CM | POA: Diagnosis present

## 2022-01-29 DIAGNOSIS — R531 Weakness: Secondary | ICD-10-CM | POA: Diagnosis not present

## 2022-01-29 DIAGNOSIS — Z7901 Long term (current) use of anticoagulants: Secondary | ICD-10-CM

## 2022-01-29 DIAGNOSIS — Z888 Allergy status to other drugs, medicaments and biological substances status: Secondary | ICD-10-CM

## 2022-01-29 DIAGNOSIS — Z87891 Personal history of nicotine dependence: Secondary | ICD-10-CM

## 2022-01-29 LAB — COMPREHENSIVE METABOLIC PANEL
ALT: 36 U/L (ref 0–44)
AST: 66 U/L — ABNORMAL HIGH (ref 15–41)
Albumin: 1.8 g/dL — ABNORMAL LOW (ref 3.5–5.0)
Alkaline Phosphatase: 974 U/L — ABNORMAL HIGH (ref 38–126)
Anion gap: 6 (ref 5–15)
BUN: 36 mg/dL — ABNORMAL HIGH (ref 8–23)
CO2: 21 mmol/L — ABNORMAL LOW (ref 22–32)
Calcium: 8 mg/dL — ABNORMAL LOW (ref 8.9–10.3)
Chloride: 109 mmol/L (ref 98–111)
Creatinine, Ser: 1.46 mg/dL — ABNORMAL HIGH (ref 0.61–1.24)
GFR, Estimated: 50 mL/min — ABNORMAL LOW (ref >60)
Glucose, Bld: 105 mg/dL — ABNORMAL HIGH (ref 70–99)
Potassium: 3.7 mmol/L (ref 3.5–5.1)
Sodium: 136 mmol/L (ref 135–145)
Total Bilirubin: 0.8 mg/dL (ref 0.3–1.2)
Total Protein: 5 g/dL — ABNORMAL LOW (ref 6.5–8.1)

## 2022-01-29 LAB — CBC
HCT: 32.6 % — ABNORMAL LOW (ref 39.0–52.0)
Hemoglobin: 9.7 g/dL — ABNORMAL LOW (ref 13.0–17.0)
MCH: 24.3 pg — ABNORMAL LOW (ref 26.0–34.0)
MCHC: 29.8 g/dL — ABNORMAL LOW (ref 30.0–36.0)
MCV: 81.5 fL (ref 80.0–100.0)
Platelets: 345 10*3/uL (ref 150–400)
RBC: 4 MIL/uL — ABNORMAL LOW (ref 4.22–5.81)
RDW: 18.2 % — ABNORMAL HIGH (ref 11.5–15.5)
WBC: 10.7 10*3/uL — ABNORMAL HIGH (ref 4.0–10.5)
nRBC: 0 % (ref 0.0–0.2)

## 2022-01-29 MED ORDER — MORPHINE SULFATE (PF) 2 MG/ML IV SOLN
2.0000 mg | INTRAVENOUS | Status: DC | PRN
  Administered 2022-01-30: 2 mg via INTRAVENOUS
  Filled 2022-01-29: qty 1

## 2022-01-29 MED ORDER — ENOXAPARIN SODIUM 40 MG/0.4ML IJ SOSY
40.0000 mg | PREFILLED_SYRINGE | INTRAMUSCULAR | Status: DC
  Administered 2022-01-29 – 2022-01-30 (×2): 40 mg via SUBCUTANEOUS
  Filled 2022-01-29 (×2): qty 0.4

## 2022-01-29 MED ORDER — SODIUM CHLORIDE 0.9 % IV SOLN
1000.0000 mL | INTRAVENOUS | Status: DC
  Administered 2022-01-29: 1000 mL via INTRAVENOUS

## 2022-01-29 MED ORDER — SODIUM CHLORIDE 0.9 % IV BOLUS (SEPSIS)
1000.0000 mL | Freq: Once | INTRAVENOUS | Status: AC
  Administered 2022-01-29: 1000 mL via INTRAVENOUS

## 2022-01-29 MED ORDER — LACTATED RINGERS IV SOLN
INTRAVENOUS | Status: DC
Start: 2022-01-29 — End: 2022-01-31

## 2022-01-29 NOTE — H&P (Signed)
History and Physical    Patient: John [name] NFA:[id number] DOB: [birth year] DOA: 01/29/2022 DOS: the patient was seen and examined on 01/29/2022 PCP: [name], MD  Patient coming from: Home  Chief Complaint:  Chief Complaint  Patient presents with   Diarrhea   HPI: XAVIAN HARDCASTLE is a 71 y.o. male with medical history significant of lung cancer, DVT, coronary artery disease, diabetes, GERD, essential hypertension, hyperlipidemia, obstructive sleep apnea among other things who has been to radiation therapy for his lung cancer but no chemotherapy.  Patient has had significant decline in his clinical status over the last few weeks.  In the last 1 week he has been having uncontrolled diarrhea and weakness.  Brought in by family.  Patient apparently unable to stand on his feet.  He is also complaining of visual changes.  Patient has been contemplating not wanting to continue with treatment apparently.  He has decided not to pursue aggressive care.  Brought into the ER with family requesting palliative treatment mainly.  He is already a DNR.  Patient does not want to pursue any aggressive care if needed for just once supportive care.  His diarrhea and weakness however has been his main problem at the moment.  He is being admitted to the hospital for management.  Review of Systems: As mentioned in the history of present illness. All other systems reviewed and are negative. Past Medical History:  Diagnosis Date   Arm DVT (deep venous thromboembolism), acute, left (HCC)    while off eliquis for bronchoscopy 2022   Atrial flutter (HCC)    s/p cardioversion   Coronary artery disease    Diabetes mellitus    GERD (gastroesophageal reflux disease)    History of nuclear stress test 04/04/2011   lexiscan; mod-large in size fixed inferolateral defect (scar); non-diagnostic for ischemia; low risk scan    Hyperlipidemia    Hypertension    Ischemic cardiomyopathy    Left foot drop    r/t  past disk srugery - uses Kevlar brace   Lung cancer ([place]) 04/26/2021   Myocardial infarction [facility])    posterior MI   Osteomyelitis ([place])    s/p left 2nd toe amputation in 01/2021   Pulmonary nodule    Recurrent ventricular tachycardia (HCC)    Shortness of breath    Sleep apnea    on CPAP; 04/28/2007 split-night - AHI during total sleep 44.43/hr and REM 72.56/hr   Past Surgical History:  Procedure Laterality Date   AMPUTATION TOE Left 02/10/2021   Procedure: AMPUTATION  LEFT SECOND TOE;  Surgeon: Edrick Kins, DPM;  Location: [place];  Service: Podiatry;  Laterality: Left;   [place]   BRONCHIAL BIOPSY  04/26/2021   Procedure: BRONCHIAL BIOPSIES;  Surgeon: Garner Nash, DO;  Location: [place] ENDOSCOPY;  Service: Pulmonary;;   BRONCHIAL BRUSHINGS  04/26/2021   Procedure: BRONCHIAL BRUSHINGS;  Surgeon: Garner Nash, DO;  Location: [place] ENDOSCOPY;  Service: Pulmonary;;   BRONCHIAL NEEDLE ASPIRATION BIOPSY  04/26/2021   Procedure: BRONCHIAL NEEDLE ASPIRATION BIOPSIES;  Surgeon: Garner Nash, DO;  Location: [place];  Service: Pulmonary;;   CARDIAC CATHETERIZATION  2010   6 stents total   CARDIAC CATHETERIZATION  01/2000   percutaneous transluminal coronary balloon angioplasty of mid RCA stenotic lesion   CARDIAC CATHETERIZATION  06/2006   no stenting; ischemic cardiomyopathy, EF 40-45%   CARDIOVERSION N/A 07/28/2016   Procedure: CARDIOVERSION;  Surgeon: Troy Sine, MD;  Location: [facility]  ENDOSCOPY;  Service: Cardiovascular;  Laterality: N/A;   CORONARY ANGIOPLASTY  09/1998   mid-distal RCA balloon dilatation, 4.5 & 5.0 stents    CORONARY ANGIOPLASTY WITH STENT PLACEMENT  03/1994   angioplasty & stenting (non-DES) of circumflex/prox ramus intermedius   CORONARY ANGIOPLASTY WITH STENT PLACEMENT  10/1994   large iliac [id number] stent to RCA   CORONARY ANGIOPLASTY WITH STENT PLACEMENT  12/2002   4.40mm stents x2 of RCA   CORONARY ANGIOPLASTY WITH STENT PLACEMENT  01/2005   cutting  balloon arthrectomy of distal RCA & Cypher DES 3.5x13; cutting balloon arthrectomy of mid RCA with Cypher DES 3.5x18   CORONARY ANGIOPLASTY WITH STENT PLACEMENT  11/2008   stenting of mid RCA with 4.0x68mm driver, non-DES   CORONARY BALLOON ANGIOPLASTY N/A 02/15/2021   Procedure: CORONARY BALLOON ANGIOPLASTY;  Surgeon: Troy Sine, MD;  Location: [place] CV LAB;  Service: Cardiovascular;  Laterality: N/A;   FIDUCIAL MARKER PLACEMENT  04/26/2021   Procedure: FIDUCIAL MARKER PLACEMENT;  Surgeon: Garner Nash, DO;  Location: [place] ENDOSCOPY;  Service: Pulmonary;;   ICD IMPLANT N/A 08/20/2020   Procedure: ICD IMPLANT;  Surgeon: Constance Haw, MD;  Location: [place] CV LAB;  Service: Cardiovascular;  Laterality: N/A;   INTRAVASCULAR PRESSURE WIRE/FFR STUDY N/A 03/02/2020   Procedure: INTRAVASCULAR PRESSURE WIRE/FFR STUDY;  Surgeon: Leonie Man, MD;  Location: [place] CV LAB;  Service: Cardiovascular;  Laterality: N/A;   LEFT HEART CATH AND CORONARY ANGIOGRAPHY N/A 03/02/2020   Procedure: LEFT HEART CATH AND CORONARY ANGIOGRAPHY;  Surgeon: Leonie Man, MD;  Location: [place] CV LAB;  Service: Cardiovascular;  Laterality: N/A;   LEFT HEART CATH AND CORONARY ANGIOGRAPHY N/A 08/19/2020   Procedure: LEFT HEART CATH AND CORONARY ANGIOGRAPHY;  Surgeon: Lorretta Harp, MD;  Location: [place] CV LAB;  Service: Cardiovascular;  Laterality: N/A;   LEFT HEART CATH AND CORONARY ANGIOGRAPHY N/A 02/15/2021   Procedure: LEFT HEART CATH AND CORONARY ANGIOGRAPHY;  Surgeon: Troy Sine, MD;  Location: [place] CV LAB;  Service: Cardiovascular;  Laterality: N/A;   LEFT HEART CATHETERIZATION WITH CORONARY ANGIOGRAM N/A 02/27/2012   Procedure: LEFT HEART CATHETERIZATION WITH CORONARY ANGIOGRAM;  Surgeon: Lorretta Harp, MD;  Location: [facility] CATH LAB;  Service: Cardiovascular;  Laterality: N/A;   TRANSTHORACIC ECHOCARDIOGRAM  07/29/2010   EF 50=55%, mod inf wall hypokinesis & mild post  wall hypokinesis; LA mild-mod dilated; mild mitral annular calcif & mild MR; mild TR & elevated RV systolic pressure; AV mildly sclerotic; mild aortic root dilatation    V TACH ABLATION N/A 01/20/2021   Procedure: V TACH ABLATION;  Surgeon: Vickie Epley, MD;  Location: [place] CV LAB;  Service: Cardiovascular;  Laterality: N/A;   VIDEO BRONCHOSCOPY WITH ENDOBRONCHIAL NAVIGATION Left 04/26/2021   Procedure: VIDEO BRONCHOSCOPY WITH ENDOBRONCHIAL NAVIGATION;  Surgeon: Garner Nash, DO;  Location: [place];  Service: Pulmonary;  Laterality: Left;  ION w/ fiducial   VIDEO BRONCHOSCOPY WITH RADIAL ENDOBRONCHIAL ULTRASOUND  04/26/2021   Procedure: RADIAL ENDOBRONCHIAL ULTRASOUND;  Surgeon: Garner Nash, DO;  Location: [place] ENDOSCOPY;  Service: Pulmonary;;   Social History:  reports that he quit smoking about 5 years ago. His smoking use included cigarettes. He has a 50.00 pack-year smoking history. He has never used smokeless tobacco. He reports that he does not currently use alcohol. He reports that he does not use drugs.  Allergies  Allergen Reactions   Omega-3 Fatty Acids Hives and Itching   Benazepril  Other (See Comments)    hyperkalemia   Fish Allergy Itching    Family History  Problem Relation Age of Onset   Bone cancer Mother    Heart attack Father     Prior to Admission medications   Medication Sig Start Date End Date Taking? Authorizing Provider  acetaminophen (TYLENOL) 500 MG tablet Take 500 mg by mouth every 6 (six) hours as needed (pain).    [provider]  albuterol (VENTOLIN HFA) 108 (90 Base) MCG/ACT inhaler Inhale 2 puffs into the lungs every 6 (six) hours as needed for wheezing or shortness of breath. 01/18/22   Gerlene Fee, NP  Amino Acids-Protein Hydrolys (PRO-STAT SUGAR FREE PO) Take 30 mLs by mouth with breakfast, with lunch, and with evening meal.    [provider]  amiodarone (PACERONE) 200 MG tablet Take 1 tablet (200 mg total) by  mouth 2 (two) times daily. 01/18/22   Gerlene Fee, NP  apixaban (ELIQUIS) 5 MG TABS tablet Take 1 tablet (5 mg total) by mouth 2 (two) times daily. 01/18/22   Gerlene Fee, NP  clopidogrel (PLAVIX) 75 MG tablet Take 1 tablet (75 mg total) by mouth daily. 01/18/22   Gerlene Fee, NP  Fluticasone Furoate (ARNUITY ELLIPTA) 200 MCG/ACT AEPB Inhale 1 puff into the lungs daily. 01/18/22   Gerlene Fee, NP  furosemide (LASIX) 20 MG tablet Take 1 tablet (20 mg total) by mouth daily. 01/18/22   Gerlene Fee, NP  glucose blood (ONETOUCH ULTRA) test strip Use as instructed 10/27/21   [name], MD  insulin aspart (NOVOLOG) 100 UNIT/ML injection Inject 5 Units into the skin in the morning and at bedtime. 01/18/22   Gerlene Fee, NP  Insulin Pen Needle (NOVOFINE PEN NEEDLE) 32G X 6 MM MISC 1 each by Does not apply route daily. 01/18/22   Gerlene Fee, NP  loperamide (IMODIUM) 2 MG capsule Take 1 capsule (2 mg total) by mouth every 6 (six) hours as needed for diarrhea or loose stools. 12/24/21   Kathie Dike, MD  mexiletine (MEXITIL) 150 MG capsule Take 2 capsules (300 mg total) by mouth every 12 (twelve) hours. 01/18/22   Gerlene Fee, NP  montelukast (SINGULAIR) 10 MG tablet Take 1 tablet (10 mg total) by mouth daily. 01/18/22   Gerlene Fee, NP  nitroGLYCERIN (NITROSTAT) 0.4 MG SL tablet Place 1 tablet (0.4 mg total) under the tongue every 5 (five) minutes as needed for chest pain. 01/18/22   Gerlene Fee, NP  potassium chloride SA (KLOR-CON M) 20 MEQ tablet Take 1 tablet (20 mEq total) by mouth daily. 01/18/22   Gerlene Fee, NP    Physical Exam: Vitals:   01/29/22 1830 01/29/22 1845 01/29/22 1930 01/29/22 2000  BP: 100/69  (!) 107/53 103/66  Pulse:  81 70 70  Resp: (!) 22 20 (!) 21 (!) 21  Temp:      TempSrc:      SpO2:  97% 98% 93%  Weight:      Height:       Generally: Chronically ill looking, weak, no obvious distress HEENT, PERRL, EOMI, dry mucous  membranes Neck: Supple, no JVD, no lymphadenopathy Respiratory: Good air entry bilaterally, no wheeze no rales or crackles Cardiovascular system: S1-S2 no audible murmur Abdomen: Soft, nontender, positive bowel sounds Extremities: No edema cyanosis or clubbing Skin exam: Dry skin, no rashes or ulcers Neuro exam: Awake alert oriented and communicative no distress  Data  Reviewed:  Temperature 97.4 otherwise vitals appear stable White count is 10.7, hemoglobin 9.7 and platelets 345.  Sodium 136 creatinine is 1.46 BUN 36 and calcium 8.0.  Glucose 105,Previous creatinine was normal at 1.2.  Alkaline phosphatase is 974.  It was previously 537.  Albumin 1.8 AST 66 ALT 36 total protein 5.0 GFR of 15.  White count is 10.7 hemoglobin 9.7 and platelet count of 345.  Stool studies are currently pending.  Assessment and Plan:  #1 failure to thrive in an adult: Secondary to multifactorial causes including history of cancer but acute illness also contributing.  Patient was palliative care consult and possible hospice.  We will admit the patient.  Hydrate the patient but avoid aggressive testing until goals of care is fully delineated.  He already has severe  protein calorie malnutrition.  He is also a DNR already.  #2 persistent diarrhea: Presumed infectious.  Will check stool studies including C. difficile.  Symptomatic treatment for now.  #3 elevated LFTs: Patient apparently had liver metastasis from his lung cancer which may be worsening.  Patient does not want any further evaluation in this regard.  We will monitor  #4 coronary artery disease: Stable.  We will resume home regimen if patient is willing to pursue treatment.  #5 essential hypertension: Blood pressure is well controlled.  #6 diabetes: We will order sliding scale insulin if patient wants treatment.  At this point he does not want frequent blood checks.  #7 GERD: We will order PPIs     Advance Care Planning:   Code Status: DNR  DNR  Consults: Palliative care consult requested  Family Communication: Wife and daughter at bedside  Severity of Illness: The appropriate patient status for this patient is INPATIENT. Inpatient status is judged to be reasonable and necessary in order to provide the required intensity of service to ensure the patient's safety. The patient's presenting symptoms, physical exam findings, and initial radiographic and laboratory data in the context of their chronic comorbidities is felt to place them at high risk for further clinical deterioration. Furthermore, it is not anticipated that the patient will be medically stable for discharge from the hospital within 2 midnights of admission.   * I certify that at the point of admission it is my clinical judgment that the patient will require inpatient hospital care spanning beyond 2 midnights from the point of admission due to high intensity of service, high risk for further deterioration and high frequency of surveillance required.*  AuthorBarbette Merino, MD 01/29/2022 8:17 PM  For on call review [email or web address].

## 2022-01-29 NOTE — ED Notes (Signed)
DNR bracelet applied to pt. Family and pt verbalized they only want comfort measures. Dr. Darlyn Chamber.

## 2022-01-29 NOTE — ED Notes (Signed)
Pt verbalized he would not want CPR if his heart stopped. Nurse explained she would tell the dr.  Daralene [name] noted to sacrum.

## 2022-01-29 NOTE — ED Provider Notes (Signed)
[facility] EMERGENCY DEPARTMENT Provider Note   CSN: [id number] Arrival date & time: 01/29/22  1631     History  Chief Complaint  Patient presents with   Diarrhea    John [name] is a 71 y.o. male.   Diarrhea    Patient has a history of coronary artery disease, myocardial infarction, hypertension, hyperlipidemia, reflux, atrial flutter, ischemic cardiomyopathy, osteomyelitis, DVT and lung cancer.  Family states patient had been in a nursing home since July.  Records indicate the patient was admitted to hospital on July 6 and was discharged on July 8.  At that time he was diagnosed with hypokalemia and malignant neoplasm of the left lung.  Plan was for the patient to follow-up with oncology.  Patient was evaluated by palliative care previously.  Patient did confirm DNR.  Family states the patient checked him out from the nursing home last week.  According to the nursing home notes he was primarily admitted for short-term rehab.  Patient family states since leaving the hospital he has been very weak.  He has been falling.  He has been having very frequent diarrhea about 5 episodes per day.  Patient states he is very weak now and tired.  He does not want any further aggressive care.  He understands he will likely die from the cancer.  Home Medications Prior to Admission medications   Medication Sig Start Date End Date Taking? Authorizing Provider  acetaminophen (TYLENOL) 500 MG tablet Take 500 mg by mouth every 6 (six) hours as needed (pain).    [provider]  albuterol (VENTOLIN HFA) 108 (90 Base) MCG/ACT inhaler Inhale 2 puffs into the lungs every 6 (six) hours as needed for wheezing or shortness of breath. 01/18/22   Gerlene Fee, NP  Amino Acids-Protein Hydrolys (PRO-STAT SUGAR FREE PO) Take 30 mLs by mouth with breakfast, with lunch, and with evening meal.    [provider]  amiodarone (PACERONE) 200 MG tablet Take 1 tablet (200 mg total) by mouth 2 (two)  times daily. 01/18/22   Gerlene Fee, NP  apixaban (ELIQUIS) 5 MG TABS tablet Take 1 tablet (5 mg total) by mouth 2 (two) times daily. 01/18/22   Gerlene Fee, NP  clopidogrel (PLAVIX) 75 MG tablet Take 1 tablet (75 mg total) by mouth daily. 01/18/22   Gerlene Fee, NP  Fluticasone Furoate (ARNUITY ELLIPTA) 200 MCG/ACT AEPB Inhale 1 puff into the lungs daily. 01/18/22   Gerlene Fee, NP  furosemide (LASIX) 20 MG tablet Take 1 tablet (20 mg total) by mouth daily. 01/18/22   Gerlene Fee, NP  glucose blood (ONETOUCH ULTRA) test strip Use as instructed 10/27/21   [name], MD  insulin aspart (NOVOLOG) 100 UNIT/ML injection Inject 5 Units into the skin in the morning and at bedtime. 01/18/22   Gerlene Fee, NP  Insulin Pen Needle (NOVOFINE PEN NEEDLE) 32G X 6 MM MISC 1 each by Does not apply route daily. 01/18/22   Gerlene Fee, NP  loperamide (IMODIUM) 2 MG capsule Take 1 capsule (2 mg total) by mouth every 6 (six) hours as needed for diarrhea or loose stools. 12/24/21   Kathie Dike, MD  mexiletine (MEXITIL) 150 MG capsule Take 2 capsules (300 mg total) by mouth every 12 (twelve) hours. 01/18/22   Gerlene Fee, NP  montelukast (SINGULAIR) 10 MG tablet Take 1 tablet (10 mg total) by mouth daily. 01/18/22   Gerlene Fee, NP  nitroGLYCERIN (NITROSTAT)  0.4 MG SL tablet Place 1 tablet (0.4 mg total) under the tongue every 5 (five) minutes as needed for chest pain. 01/18/22   Gerlene Fee, NP  potassium chloride SA (KLOR-CON M) 20 MEQ tablet Take 1 tablet (20 mEq total) by mouth daily. 01/18/22   Gerlene Fee, NP      Allergies    Omega-3 fatty acids, Benazepril, and Fish allergy    Review of Systems   Review of Systems  Gastrointestinal:  Positive for diarrhea.    Physical Exam Updated Vital Signs BP 103/66   Pulse 70   Temp 98.5 F (36.9 C) (Rectal)   Resp (!) 21   Ht 1.829 m (6')   Wt 70.8 kg   SpO2 93%   BMI 21.16 kg/m  Physical Exam Vitals and nursing  note reviewed.  Constitutional:      Appearance: He is well-developed. He is ill-appearing.  HENT:     Head: Normocephalic and atraumatic.     Right Ear: External ear normal.     Left Ear: External ear normal.     Mouth/Throat:     Mouth: Mucous membranes are dry.  Eyes:     General: No scleral icterus.       Right eye: No discharge.        Left eye: No discharge.     Conjunctiva/sclera: Conjunctivae normal.  Neck:     Trachea: No tracheal deviation.  Cardiovascular:     Rate and Rhythm: Normal rate.  Pulmonary:     Effort: Pulmonary effort is normal. No respiratory distress.     Breath sounds: No stridor.  Abdominal:     General: There is no distension.     Tenderness: There is no abdominal tenderness. There is no guarding.  Musculoskeletal:        General: No swelling or deformity.     Cervical back: Neck supple.  Skin:    General: Skin is warm and dry.     Findings: No rash.  Neurological:     Mental Status: He is alert.     Cranial Nerves: Cranial nerve deficit: no gross deficits.     Motor: Weakness present.     Comments: Patient weak all over, difficulty lifting legs off the bed     ED Results / Procedures / Treatments   Labs (all labs ordered are listed, but only abnormal results are displayed) Labs Reviewed  CBC - Abnormal; Notable for the following components:      Result Value   WBC 10.7 (*)    RBC 4.00 (*)    Hemoglobin 9.7 (*)    HCT 32.6 (*)    MCH 24.3 (*)    MCHC 29.8 (*)    RDW 18.2 (*)    All other components within normal limits  COMPREHENSIVE METABOLIC PANEL - Abnormal; Notable for the following components:   CO2 21 (*)    Glucose, Bld 105 (*)    BUN 36 (*)    Creatinine, Ser 1.46 (*)    Calcium 8.0 (*)    Total Protein 5.0 (*)    Albumin 1.8 (*)    AST 66 (*)    Alkaline Phosphatase 974 (*)    GFR, Estimated 50 (*)    All other components within normal limits  C DIFFICILE QUICK SCREEN W PCR REFLEX      EKG EKG  Interpretation  Date/Time:  Sunday January 29 2022 16:52:35 EDT Ventricular Rate:  71 PR Interval:  149  QRS Duration: 130 QT Interval:  516 QTC Calculation: 561 R Axis:   50 Text Interpretation: Sinus rhythm Nonspecific intraventricular conduction delay Inferior infarct, old Confirmed by Dorie Rank [phone]) on 01/29/2022 7:32:34 PM  Radiology No results found.  Procedures Procedures    Medications Ordered in ED Medications  sodium chloride 0.9 % bolus 1,000 mL (1,000 mLs Intravenous New Bag/Given 01/29/22 1851)    Followed by  0.9 %  sodium chloride infusion (1,000 mLs Intravenous New Bag/Given 01/29/22 1852)    ED Course/ Medical Decision Making/ A&P Clinical Course as of 01/29/22 2015  Sun Jan 29, 2022  1931 Comprehensive metabolic panel(!) Creatinine elevated compared to previous, alk phos elevated [JK]  2002 CBC(!) Anemia increased compared to previous values [JK]  2013 D/w  Dr Jonelle Sidle regarding admission  [JK]    Clinical Course User Index [JK] Dorie Rank, MD                           Medical Decision Making Problems Addressed: Dehydration: acute illness or injury that poses a threat to life or bodily functions Diarrhea, unspecified type: acute illness or injury Malignant neoplasm of lung, unspecified laterality, unspecified part of lung ([place]): chronic illness or injury with exacerbation, progression, or side effects of treatment Weakness: acute illness or injury that poses a threat to life or bodily functions  Amount and/or Complexity of Data Reviewed Labs: ordered. Decision-making details documented in ED Course.  Risk Prescription drug management. Decision regarding hospitalization.   Patient presents to the ED for evaluation of increasing weakness diarrhea.  Patient has known history of lung cancer.  He has had progressive issues with weakness and has had significant diarrhea at home.  Decreased p.o. intake.  Laboratory test do show signs of dehydration.  No  signs of any acute infection.  No severe electrolyte abnormalities.  Patient has voiced just wanting comfort measures at this point.  He is tired and does not sound like he wants any further treatment of his cancer.  Patient may benefit from palliative hospice placement.  Case was discussed with Dr. Jonelle Sidle.  Patient will be admitted to hospital for further treatment        Final Clinical Impression(s) / ED Diagnoses Final diagnoses:  Diarrhea, unspecified type  Weakness  Dehydration  Malignant neoplasm of lung, unspecified laterality, unspecified part of lung [facility])    Rx / DC Orders ED Discharge Orders     None         Dorie Rank, MD 01/29/22 2015

## 2022-01-29 NOTE — ED Triage Notes (Signed)
Pt arrived [facility] from home. Pt lives with wife and has had diarrhea x 1 week, failure to thrive, decrease po intake, increased weakness, . Pt has lung cancer and takes radiation at this time.

## 2022-01-30 ENCOUNTER — Telehealth: Payer: Medicare Other

## 2022-01-30 ENCOUNTER — Encounter (HOSPITAL_COMMUNITY): Payer: Self-pay | Admitting: Internal Medicine

## 2022-01-30 DIAGNOSIS — R627 Adult failure to thrive: Secondary | ICD-10-CM | POA: Diagnosis not present

## 2022-01-30 DIAGNOSIS — Z515 Encounter for palliative care: Secondary | ICD-10-CM

## 2022-01-30 DIAGNOSIS — Z7189 Other specified counseling: Secondary | ICD-10-CM | POA: Diagnosis not present

## 2022-01-30 LAB — GLUCOSE, CAPILLARY
Glucose-Capillary: 67 mg/dL — ABNORMAL LOW (ref 70–99)
Glucose-Capillary: 77 mg/dL (ref 70–99)

## 2022-01-30 LAB — CBC
HCT: 29.8 % — ABNORMAL LOW (ref 39.0–52.0)
Hemoglobin: 8.5 g/dL — ABNORMAL LOW (ref 13.0–17.0)
MCH: 23.9 pg — ABNORMAL LOW (ref 26.0–34.0)
MCHC: 28.5 g/dL — ABNORMAL LOW (ref 30.0–36.0)
MCV: 83.9 fL (ref 80.0–100.0)
Platelets: 320 10*3/uL (ref 150–400)
RBC: 3.55 MIL/uL — ABNORMAL LOW (ref 4.22–5.81)
RDW: 18.1 % — ABNORMAL HIGH (ref 11.5–15.5)
WBC: 10.4 10*3/uL (ref 4.0–10.5)
nRBC: 0 % (ref 0.0–0.2)

## 2022-01-30 LAB — COMPREHENSIVE METABOLIC PANEL
ALT: 37 U/L (ref 0–44)
AST: 77 U/L — ABNORMAL HIGH (ref 15–41)
Albumin: 1.7 g/dL — ABNORMAL LOW (ref 3.5–5.0)
Alkaline Phosphatase: 558 U/L — ABNORMAL HIGH (ref 38–126)
Anion gap: 7 (ref 5–15)
BUN: 33 mg/dL — ABNORMAL HIGH (ref 8–23)
CO2: 20 mmol/L — ABNORMAL LOW (ref 22–32)
Calcium: 8 mg/dL — ABNORMAL LOW (ref 8.9–10.3)
Chloride: 112 mmol/L — ABNORMAL HIGH (ref 98–111)
Creatinine, Ser: 1.25 mg/dL — ABNORMAL HIGH (ref 0.61–1.24)
GFR, Estimated: >60 mL/min (ref >60)
Glucose, Bld: 48 mg/dL — ABNORMAL LOW (ref 70–99)
Potassium: 4.3 mmol/L (ref 3.5–5.1)
Sodium: 139 mmol/L (ref 135–145)
Total Bilirubin: 0.6 mg/dL (ref 0.3–1.2)
Total Protein: 4.8 g/dL — ABNORMAL LOW (ref 6.5–8.1)

## 2022-01-30 MED ORDER — LOPERAMIDE HCL 2 MG PO CAPS
2.0000 mg | ORAL_CAPSULE | Freq: Four times a day (QID) | ORAL | Status: DC | PRN
Start: 2022-01-30 — End: 2022-01-31
  Administered 2022-01-30: 2 mg via ORAL
  Filled 2022-01-30: qty 1

## 2022-01-30 MED ORDER — ORAL CARE MOUTH RINSE: 15.0000 mL | OROMUCOSAL | Status: DC | PRN

## 2022-01-30 MED ORDER — ZOLPIDEM TARTRATE 5 MG PO TABS
5.0000 mg | ORAL_TABLET | Freq: Every evening | ORAL | Status: DC | PRN
Start: 2022-01-30 — End: 2022-01-31
  Administered 2022-01-30 – 2022-01-31 (×2): 5 mg via ORAL
  Filled 2022-01-30 (×2): qty 1

## 2022-01-30 MED ORDER — DEXTROSE-NACL 5-0.9 % IV SOLN: INTRAVENOUS | Status: DC

## 2022-01-30 NOTE — Progress Notes (Addendum)
PROGRESS NOTE    John [name]  FYB:[id number] DOB: 1950-10-31 DOA: 01/29/2022 PCP: [name], MD   Brief Narrative: 71 year old male with history of lung cancer CAD DVT type 2 diabetes hyperlipidemia essential hypertension GERD lung cancer has received radiation therapy.  Family brought him in as patient had uncontrolled diarrhea and generalized weakness and was unable to stand on his feet.  Patient has made up his mind not to have any aggressive therapy.  Palliative care consulted.  Patient is DNR.    Assessment & Plan:   Principal Problem:   FTT (failure to thrive) in adult Active Problems:   Malignant neoplasm of upper lobe of left lung (HCC)   COPD (chronic obstructive pulmonary disease) (HCC)   Type 2 diabetes mellitus with peripheral autonomic neuropathy (HCC)   HTN (hypertension)   GERD (gastroesophageal reflux disease)   CAD, multiple prior RCA PCI's. Last cath 2010, Myoview low risk Oct 2012   Hypertension   Chronic systolic CHF (congestive heart failure) (HCC)   Atrial fibrillation, chronic (HCC)   Generalized weakness   Diabetic peripheral neuropathy associated with type 2 diabetes mellitus ([place])   Hyperlipidemia associated with type 2 diabetes mellitus ([place])   Current long-term use of anticoagulant medication with history of deep venous thrombosis (DVT)   Severe protein-calorie malnutrition (HCC)   Elevated liver enzymes   AKI (acute kidney injury) ([place])   Diarrhea  #1 failure to thrive patient with multiple comorbidities including lung cancer.  He does not want any aggressive measures and is requesting palliative care and possibly hospice.  #2 hypoglycemia we will change fluids to D5 NS CBG (last 3)  Recent Labs    01/30/22 0718  GLUCAP 67*     #3 history of DVT on Eliquis at home which is on hold  #4 history of CAD MI on multiple medications at home  #5 history of essential hypertension however his blood pressure has been soft  #6 goals of  care patient is DNR Palliative consulted Patient prefers no heroic measures or aggressive care Appreciate palliative input  #7 AKI due to decreased p.o. intake and diarrhea Continue IV fluids   Pressure Injury 12/23/21 Coccyx Mid Stage 2 -  Partial thickness loss of dermis presenting as a shallow open injury with a red, pink wound bed without slough. open area 1.5x1.5 (Active)  12/23/21 0810  Location: Coccyx  Location Orientation: Mid  Staging: Stage 2 -  Partial thickness loss of dermis presenting as a shallow open injury with a red, pink wound bed without slough.  Wound Description (Comments): open area 1.5x1.5  Present on Admission: Yes    Estimated body mass index is 21.14 kg/m as calculated from the following:   Height as of this encounter: 6' (1.829 m).   Weight as of this encounter: 70.7 kg.  DVT prophylaxis: Lovenox Code Status: DNR  family Communication: None at bedside  disposition Plan:  Status is: Inpatient Remains inpatient appropriate because: Failure to thrive palliative consult lung cancer possible hospice   Consultants:  Palliative care  Procedures: None Antimicrobials: None Subjective: Patient resting in bed trying to sleep through a straw some orange juice as his blood sugar was low this morning He denies having any abdominal pain or diarrhea or nausea  Objective: Vitals:   01/29/22 2147 01/30/22 0210 01/30/22 0228 01/30/22 0455  BP: (!) 96/58 (!) 89/55 112/68 (!) 98/59  Pulse: 70 70  70  Resp: 17 18  20   Temp: 98.2 F (36.8 C)  98.7 F (37.1 C)  (!) 97.5 F (36.4 C)  TempSrc:    Oral  SpO2: 98% 97%  98%  Weight:      Height:        Intake/Output Summary (Last 24 hours) at 01/30/2022 1027 Last data filed at 01/30/2022 0654 Gross per 24 hour  Intake 1683.47 ml  Output --  Net 1683.47 ml   Filed Weights   01/29/22 1644 01/29/22 1655 01/29/22 2111  Weight: 70.8 kg 70.8 kg 70.7 kg    Examination:  General exam: Appears chronically  ill-appearing Respiratory system: Clear to auscultation. Respiratory effort normal. Cardiovascular system: S1 & S2 heard, RRR. No JVD, murmurs, rubs, gallops or clicks. No pedal edema. Gastrointestinal system: Abdomen is scaphoid soft and nontender. No organomegaly or masses felt. Normal bowel sounds heard. Central nervous system: Alert and oriented. No focal neurological deficits. Extremities: No edema Skin: No rashes, lesions or ulcers Psychiatry: Judgement and insight appear normal. Mood & affect appropriate.     Data Reviewed: I have personally reviewed following labs and imaging studies  CBC: Recent Labs  Lab 01/29/22 1834 01/30/22 0439  WBC 10.7* 10.4  HGB 9.7* 8.5*  HCT 32.6* 29.8*  MCV 81.5 83.9  PLT 345 010   Basic Metabolic Panel: Recent Labs  Lab 01/29/22 1834 01/30/22 0439  NA 136 139  K 3.7 4.3  CL 109 112*  CO2 21* 20*  GLUCOSE 105* 48*  BUN 36* 33*  CREATININE 1.46* 1.25*  CALCIUM 8.0* 8.0*   GFR: Estimated Creatinine Clearance: 52.6 mL/min (A) (by C-G formula based on SCr of 1.25 mg/dL (H)). Liver Function Tests: Recent Labs  Lab 01/29/22 1834 01/30/22 0439  AST 66* 77*  ALT 36 37  ALKPHOS 974* 558*  BILITOT 0.8 0.6  PROT 5.0* 4.8*  ALBUMIN 1.8* 1.7*   No results for input(s): "LIPASE", "AMYLASE" in the last 168 hours. No results for input(s): "AMMONIA" in the last 168 hours. Coagulation Profile: No results for input(s): "INR", "PROTIME" in the last 168 hours. Cardiac Enzymes: No results for input(s): "CKTOTAL", "CKMB", "CKMBINDEX", "TROPONINI" in the last 168 hours. BNP (last 3 results) Recent Labs    06/30/21 0908 12/13/21 1450  PROBNP 1,179* 2,463*   HbA1C: No results for input(s): "HGBA1C" in the last 72 hours. CBG: Recent Labs  Lab 01/30/22 0718  GLUCAP 67*   Lipid Profile: No results for input(s): "CHOL", "HDL", "LDLCALC", "TRIG", "CHOLHDL", "LDLDIRECT" in the last 72 hours. Thyroid Function Tests: No results for  input(s): "TSH", "T4TOTAL", "FREET4", "T3FREE", "THYROIDAB" in the last 72 hours. Anemia Panel: No results for input(s): "VITAMINB12", "FOLATE", "FERRITIN", "TIBC", "IRON", "RETICCTPCT" in the last 72 hours. Sepsis Labs: No results for input(s): "PROCALCITON", "LATICACIDVEN" in the last 168 hours.  No results found for this or any previous visit (from the past 240 hour(s)).       Radiology Studies: No results found.      Scheduled Meds:  enoxaparin (LOVENOX) injection  40 mg Subcutaneous Q24H   Continuous Infusions:  sodium chloride 1,000 mL (01/29/22 1852)   lactated ringers 125 mL/hr at 01/30/22 0754     LOS: 1 day    Time spent: 42 min  Georgette Shell, MD  01/30/2022, 10:27 AM

## 2022-01-30 NOTE — NC FL2 (Signed)
[place] LEVEL OF CARE SCREENING TOOL     IDENTIFICATION  Patient Name: John [name] Birthdate: Nov 28, 1950 Sex: male Admission Date (Current Location): 01/29/2022  [facility] and [place] Number:  [employer] and Address:  [facility] [address] [place]      Provider Number: [id number]  Attending Physician Name and Address:  Georgette Shell, MD  Relative Name and Phone Number:       Current Level of Care: Hospital Recommended Level of Care: Nursing Facility Prior Approval Number:    Date Approved/Denied:   PASRR Number: [id number]  Discharge Plan: SNF    Current Diagnoses: Patient Active Problem List   Diagnosis Date Noted   AKI (acute kidney injury) ([place]) 01/29/2022   FTT (failure to thrive) in adult 01/29/2022   Diarrhea 01/29/2022   Metastatic carcinoma to liver ([place]) 01/05/2022   Elevated liver enzymes 01/04/2022   Hypertensive heart disease with chronic systolic congestive heart failure ([place]) 12/26/2021   Diabetic peripheral neuropathy associated with type 2 diabetes mellitus ([place]) 12/26/2021   Hyperlipidemia associated with type 2 diabetes mellitus ([place]) 12/26/2021   Current long-term use of anticoagulant medication with history of deep venous thrombosis (DVT) 12/26/2021   Severe protein-calorie malnutrition ([place]) 12/26/2021   Aortic atherosclerosis ([place]) 12/26/2021   AAA (abdominal aortic aneurysm) without rupture (HCC) 12/26/2021   Pressure injury of skin 12/24/2021   Hypokalemia 12/22/2021   Generalized weakness 12/22/2021   Fatigue 12/22/2021   Recurrent falls 12/22/2021   Prolonged QT interval 12/22/2021   Hypoglycemia 11/05/2021   Atrial fibrillation, chronic ([place]) 11/05/2021   Goals of care, counseling/discussion    Shortness of breath 10/27/2021   DVT of left axillary vein, acute ([place]) 22/29/7989   Embolic stroke ([place]) 21/19/4174   History of non-ST elevation myocardial infarction  (NSTEMI) 01/30/4817   Chronic systolic CHF (congestive heart failure) ([place]) 02/08/2021   VT (ventricular tachycardia) ([place]) 01/26/2021   Coagulation defect ([place]) 12/22/2020   Non-ST elevation (NSTEMI) myocardial infarction ([place])    Capsulitis 05/21/2020   Thoracic aortic aneurysm ([place]) 02/29/2020   Malignant neoplasm of upper lobe of left lung ([place]) 02/19/2020   Former smoker 02/19/2020   Acoustic neuroma ([place]) 09/11/2017   Asymmetrical sensorineural hearing loss 09/11/2017   Anticoagulation adequate 06/06/2016   Mixed hyperlipidemia 05/20/2015   OSA (obstructive sleep apnea) 07/23/2013   Hypertension    CAD, multiple prior RCA PCI's. Last cath 2010, Myoview low risk Oct 2012 02/26/2012   Type 2 diabetes mellitus with peripheral autonomic neuropathy ([place]) 02/26/2012   HTN (hypertension) 02/26/2012   COPD (chronic obstructive pulmonary disease) ([place]) 02/26/2012   GERD (gastroesophageal reflux disease) 02/26/2012    Orientation RESPIRATION BLADDER Height & Weight     Self, Situation, Place  Normal Incontinent Weight: 155 lb 13.8 oz (70.7 kg) Height:  6' (182.9 cm)  BEHAVIORAL SYMPTOMS/MOOD NEUROLOGICAL BOWEL NUTRITION STATUS      Incontinent Diet (see dc summary)  AMBULATORY STATUS COMMUNICATION OF NEEDS Skin   Extensive Assist Verbally Other (Comment), PU Stage and Appropriate Care (skin tear R and L arm from fall at home)   PU Stage 2 Dressing: Daily (sacrum)                   Personal Care Assistance Level of Assistance  Bathing, Feeding, Dressing Bathing Assistance: Maximum assistance Feeding assistance: Limited assistance Dressing Assistance: Maximum assistance     Functional Limitations Info  Sight, Hearing, Speech Sight Info: Adequate  Hearing Info: Adequate Speech Info: Adequate    SPECIAL CARE FACTORS FREQUENCY                       Contractures Contractures Info: Not present    Additional Factors Info  Code Status, Allergies Code Status Info:  DNR Allergies Info: Omega 3 fatty acids, Benazepril, Fish Allergy           Current Medications (01/30/2022):  This is the current hospital active medication list Current Facility-Administered Medications  Medication Dose Route Frequency Provider Last Rate Last Admin   dextrose 5 %-0.9 % sodium chloride infusion   Intravenous Continuous Georgette Shell, MD 100 mL/hr at 01/30/22 1125 New Bag at 01/30/22 1125   enoxaparin (LOVENOX) injection 40 mg  40 mg Subcutaneous Q24H Gala Romney L, MD   40 mg at 01/29/22 2340   lactated ringers infusion   Intravenous Continuous Elwyn Reach, MD 125 mL/hr at 01/30/22 0754 New Bag at 01/30/22 0754   morphine (PF) 2 MG/ML injection 2 mg  2 mg Intravenous Q2H PRN Elwyn Reach, MD       zolpidem (AMBIEN) tablet 5 mg  5 mg Oral QHS PRN Elwyn Reach, MD   5 mg at 01/30/22 4128     Discharge Medications: Please see discharge summary for a list of discharge medications.  Relevant Imaging Results:  Relevant Lab Results:   Additional Information SSN: [id number], LCSW

## 2022-01-30 NOTE — TOC Initial Note (Addendum)
Transition of Care [facility]) - Initial/Assessment Note    Patient Details  Name: John [name] MRN: [id number] Date of Birth: [birth year]  [facility]) CM/SW Contact:    Shade Flood, LCSW Phone Number: 01/30/2022, 11:42 AM  Clinical Narrative:                  Received referral to see pt for dc planning. Palliative APNP states pt wants to go back to rehab. TOC spoke with pt  who stated that he doesn't want to go back to rehab, but that he wants to go to long term care as he needs more care than his wife can handle. Pt would like to go to the [facility]. He asks TOC to contact his wife to discuss as he tends to be forgetful and get confused.  Spoke with pt's wife and daughter by phone to review above. Wife and dtr state that they do need referral for long term care and that they understand the need for private pay and wife agreeable. Wife confirms that [facility] is their first choice. Message left for Kerri at [facility].   TOC will follow.  1440: [facility] has accepted pt for long term care and can admit him tomorrow after pt's wife makes payment and brings home CPAP. Updated MD. Will follow up in AM.  Expected Discharge Plan: [place] Barriers to Discharge: Continued Medical Work up   Patient Goals and CMS Choice Patient states their goals for this hospitalization and ongoing recovery are:: go to LTC NH CMS Medicare.gov Compare Post Acute Care list provided to:: Patient Represenative (must comment) Choice offered to / list presented to : Spouse, Adult Children, Patient  Expected Discharge Plan and Services Expected Discharge Plan: Long Term Nursing Home In-house Referral: Clinical Social Work   Post Acute Care Choice: Nursing Home Living arrangements for the past 2 months: [place]                                      Prior Living Arrangements/Services Living arrangements for the past 2 months: Single Family Home Lives with::  Spouse Patient language and need for interpreter reviewed:: Yes Do you feel safe going back to the place where you live?: No   wife cannot take care of me  Need for Family Participation in Patient Care: Yes (Comment) Care giver support system in place?: Yes (comment) Current home services: DME Criminal Activity/Legal Involvement Pertinent to Current Situation/Hospitalization: No - Comment as needed  Activities of Daily Living Home Assistive Devices/Equipment: Shower chair with back, Wheelchair, Environmental consultant (specify type), Eyeglasses ADL Screening (condition at time of admission) Patient's cognitive ability adequate to safely complete daily activities?: Yes Is the patient deaf or have difficulty hearing?: No Does the patient have difficulty seeing, even when wearing glasses/contacts?: No Does the patient have difficulty concentrating, remembering, or making decisions?: No Patient able to express need for assistance with ADLs?: Yes Does the patient have difficulty dressing or bathing?: Yes Independently performs ADLs?: No Communication: Needs assistance Is this a change from baseline?: Pre-admission baseline Dressing (OT): Needs assistance Is this a change from baseline?: Pre-admission baseline Grooming: Needs assistance Is this a change from baseline?: Pre-admission baseline Feeding: Independent Bathing: Needs assistance Is this a change from baseline?: Pre-admission baseline Toileting: Needs assistance Is this a change from baseline?: Pre-admission baseline In/Out Bed: Needs assistance Is this a change  from baseline?: Pre-admission baseline Walks in Home: Needs assistance (but currently not walking at home due to so many falls) Is this a change from baseline?: Pre-admission baseline Does the patient have difficulty walking or climbing stairs?: Yes Weakness of Legs: Both Weakness of Arms/Hands: Both  Permission Sought/Granted Permission sought to share information with : Facility  Art therapist granted to share information with : Yes, Verbal Permission Granted     Permission granted to share info w AGENCY: snf        Emotional Assessment   Attitude/Demeanor/Rapport: Engaged Affect (typically observed): Accepting, Pleasant Orientation: : Oriented to Self, Oriented to Place, Oriented to Situation Alcohol / Substance Use: Not Applicable Psych Involvement: No (comment)  Admission diagnosis:  Dehydration [E86.0] Weakness [R53.1] FTT (failure to thrive) in adult [R62.7] Malignant neoplasm of lung, unspecified laterality, unspecified part of lung (HCC) [C34.90] Diarrhea, unspecified type [R19.7] Patient Active Problem List   Diagnosis Date Noted   AKI (acute kidney injury) ([place]) 01/29/2022   FTT (failure to thrive) in adult 01/29/2022   Diarrhea 01/29/2022   Metastatic carcinoma to liver ([place]) 01/05/2022   Elevated liver enzymes 01/04/2022   Hypertensive heart disease with chronic systolic congestive heart failure ([place]) 12/26/2021   Diabetic peripheral neuropathy associated with type 2 diabetes mellitus ([place]) 12/26/2021   Hyperlipidemia associated with type 2 diabetes mellitus ([place]) 12/26/2021   Current long-term use of anticoagulant medication with history of deep venous thrombosis (DVT) 12/26/2021   Severe protein-calorie malnutrition ([place]) 12/26/2021   Aortic atherosclerosis ([place]) 12/26/2021   AAA (abdominal aortic aneurysm) without rupture (HCC) 12/26/2021   Pressure injury of skin 12/24/2021   Hypokalemia 12/22/2021   Generalized weakness 12/22/2021   Fatigue 12/22/2021   Recurrent falls 12/22/2021   Prolonged QT interval 12/22/2021   Hypoglycemia 11/05/2021   Atrial fibrillation, chronic ([place]) 11/05/2021   Goals of care, counseling/discussion    Shortness of breath 10/27/2021   DVT of left axillary vein, acute ([place]) 61/44/3154   Embolic stroke ([employer]) 00/86/7619   History of non-ST elevation myocardial infarction (NSTEMI)  50/93/2671   Chronic systolic CHF (congestive heart failure) ([place]) 02/08/2021   VT (ventricular tachycardia) ([place]) 01/26/2021   Coagulation defect ([place]) 12/22/2020   Non-ST elevation (NSTEMI) myocardial infarction ([place])    Capsulitis 05/21/2020   Thoracic aortic aneurysm ([place]) 02/29/2020   Malignant neoplasm of upper lobe of left lung ([place]) 02/19/2020   Former smoker 02/19/2020   Acoustic neuroma ([place]) 09/11/2017   Asymmetrical sensorineural hearing loss 09/11/2017   Anticoagulation adequate 06/06/2016   Mixed hyperlipidemia 05/20/2015   OSA (obstructive sleep apnea) 07/23/2013   Hypertension    CAD, multiple prior RCA PCI's. Last cath 2010, Myoview low risk Oct 2012 02/26/2012   Type 2 diabetes mellitus with peripheral autonomic neuropathy ([place]) 02/26/2012   HTN (hypertension) 02/26/2012   COPD (chronic obstructive pulmonary disease) ([place]) 02/26/2012   GERD (gastroesophageal reflux disease) 02/26/2012   PCP:  [name], MD Pharmacy:   [id number] - [place], [address] [place] [address] Phone: [phone] Fax: [phone]     Social Determinants of Health (SDOH) Interventions    Readmission Risk Interventions    01/30/2022   11:40 AM 04/22/2021   12:09 PM 02/25/2021    3:57 PM  Readmission Risk Prevention Plan  Transportation Screening Complete Complete Complete  PCP or Specialist Appt within 3-5 Days  Complete Complete  HRI or Home Care Consult  Complete Complete  Social  Work Scientific laboratory technician for [place] Planning/Counseling  Complete Complete  Palliative Care Screening  Not Applicable Not Applicable  Medication Review Press photographer) Complete Complete Complete  HRI or Home Care Consult Complete    SW Recovery Care/Counseling Consult Complete    Palliative Care Screening Complete    Skilled Nursing Facility Complete

## 2022-01-30 NOTE — Inpatient Diabetes Management (Signed)
Inpatient Diabetes Program Recommendations  AACE/ADA: New Consensus Statement on Inpatient Glycemic Control   Target Ranges:  Prepandial:   less than 140 mg/dL      Peak postprandial:   less than 180 mg/dL (1-2 hours)      Critically ill patients:  140 - 180 mg/dL    Latest Reference Range & Units 01/30/22 07:18  Glucose-Capillary 70 - 99 mg/dL 67 (L)    Latest Reference Range & Units 01/29/22 18:34 01/30/22 04:39  Glucose 70 - 99 mg/dL 105 (H) 48 (L)   Review of Glycemic Control  Diabetes history: DM2 Outpatient Diabetes medications: Novolog 5 units BID Current orders for Inpatient glycemic control: None  Inpatient Diabetes Program Recommendations:    IV fluids:  May want to consider adding dextrose to IV fluids if appropriate.  NOTE: Admitted with failure to thrive, persistent diarrhea, with hx of lung cancer.  Per H&P, "Patient apparently unable to stand on his feet.  He is also complaining of visual changes.  Patient has been contemplating not wanting to continue with treatment apparently.  He has decided not to pursue aggressive care.  Brought into the ER with family requesting palliative treatment mainly. We will order sliding scale insulin if patient wants treatment.  At this point he does not want frequent blood checks.  Lab glucose 48 this am and finger stick 67 mg/dl.  Thanks, Barnie Alderman, RN, MSN, [place] Diabetes Coordinator Inpatient Diabetes Program [phone] (Team Pager from 8am to [place])

## 2022-01-30 NOTE — Consult Note (Signed)
Consultation Note Date: 01/30/2022   Patient Name: John [name]  DOB: Oct 22, 1950  MRN: [id number]  Age / Sex: 71 y.o., male  PCP: [name], MD Referring Physician: Georgette Shell, MD  Reason for Consultation: Establishing goals of care  HPI/Patient Profile: 71 y.o. male  with past medical history of lung cancer with radiation therapy, CAD, DVT, DM 2, HTN/HLD, GERD, admitted on 01/29/2022 with failure to thrive.   Clinical Assessment and Goals of Care: I have reviewed medical records including EPIC notes, labs and imaging, received report from RN, assessed the patient.  John [name] is lying quietly in bed.  He appears acutely/chronically ill and somewhat frail.  He is alert and oriented, able to make his basic needs known.  There is no family at bedside at this time.   We meet at bedside to discuss diagnosis prognosis, GOC, EOL wishes, disposition and options.  I introduced Palliative Medicine as specialized medical care for people living with serious illness. It focuses on providing relief from the symptoms and stress of a serious illness. The goal is to improve quality of life for both the patient and the family.  We discussed a brief life review of the patient.  John [name] states that he had been living with his wife, independent with ADLs/IADLs until he went to [facility] for rehab after hospital stay.  He states that he felt like he was doing better, but realized when he got home he was too weak to take care of himself.  He states that his wife is 63 years older than he, and this has been a very difficult burden for her.  We then focused on their current illness.  At this point John [name] agrees that he is not interested in continuing with any cancer treatments.  We talk about dehydration and kidney function.  We talk about where he sees himself going after this hospital stay.   Initially, he tells me that he would want to return to [facility] for rehab.  Transition of care is able to speak with him and verifies that he wants long-term care, not short-term rehab.  The natural disease trajectory and expectations at EOL were discussed.  Advanced directives, concepts specific to code status, artifical feeding and hydration, and rehospitalization were considered and discussed.  DNR verified.  Hospice and Palliative Care services outpatient were explained and offered.  John [name] states that he is not ready for hospice care.  He tells me that he had scheduled at home outpatient palliative visit for this week, but he is unable to name provider.  Discussed the importance of continued conversation with family and the medical providers regarding overall plan of care and treatment options, ensuring decisions are within the context of the patient's values and GOCs.  Questions and concerns were addressed.  The patient was encouraged to call with questions or concerns.  PMT will continue to support holistically.  Conference with attending, bedside nursing staff, transition of care team related to patient condition,  needs, goals of care, disposition.   HCPOA NEXT OF KIN -John [name] names his wife, [name], as his healthcare surrogate.    SUMMARY OF RECOMMENDATIONS   At this point continue to treat the treatable but no CPR or intubation. No further cancer treatments. States he is unable to go home due to weakness, requesting short-term rehab if qualified States he was to have outpatient palliative services in home visit this week, unable to name provider.   Code Status/Advance Care Planning: DNR -verified with patient.  Symptom Management:  Per hospitalist, no additional needs at this time.  Palliative Prophylaxis:  Frequent Pain Assessment, Oral Care, and Turn Reposition  Additional Recommendations (Limitations, Scope, Preferences): Continue to treat the  treatable but no CPR or intubation, no further cancer treatments  Psycho-social/Spiritual:  Desire for further Chaplaincy support:no Additional Recommendations: Caregiving  Support/Resources and Education on Hospice  Prognosis:  Unable to determine, 3 to 6 months or less would not be surprising based on advancing cancer, decreasing functional status.  Discharge Planning:  Would like STR if qualified.        Primary Diagnoses: Present on Admission:  Malignant neoplasm of upper lobe of left lung (HCC)  COPD (chronic obstructive pulmonary disease) (HCC)  GERD (gastroesophageal reflux disease)  HTN (hypertension)  Type 2 diabetes mellitus with peripheral autonomic neuropathy (HCC)  Atrial fibrillation, chronic (HCC)  CAD, multiple prior RCA PCI's. Last cath 2010, Myoview low risk Oct 2012  Diabetic peripheral neuropathy associated with type 2 diabetes mellitus ([place])  Hyperlipidemia associated with type 2 diabetes mellitus ([place])  Hypertension  Severe protein-calorie malnutrition (HCC)  AKI (acute kidney injury) ([place])  Elevated liver enzymes  Chronic systolic CHF (congestive heart failure) (HCC)  FTT (failure to thrive) in adult  Diarrhea   I have reviewed the medical record, interviewed the patient and family, and examined the patient. The following aspects are pertinent.  Past Medical History:  Diagnosis Date   Arm DVT (deep venous thromboembolism), acute, left (HCC)    while off eliquis for bronchoscopy 2022   Atrial flutter (HCC)    s/p cardioversion   Coronary artery disease    Diabetes mellitus    GERD (gastroesophageal reflux disease)    History of nuclear stress test 04/04/2011   lexiscan; mod-large in size fixed inferolateral defect (scar); non-diagnostic for ischemia; low risk scan    Hyperlipidemia    Hypertension    Ischemic cardiomyopathy    Left foot drop    r/t past disk srugery - uses Kevlar brace   Lung cancer ([place]) 04/26/2021   Myocardial infarction  [facility])    posterior MI   Osteomyelitis ([place])    s/p left 2nd toe amputation in 01/2021   Pulmonary nodule    Recurrent ventricular tachycardia (HCC)    Shortness of breath    Sleep apnea    on CPAP; 04/28/2007 split-night - AHI during total sleep 44.43/hr and REM 72.56/hr   Social History   Socioeconomic History   Marital status: Married    Spouse name: [name]   Number of children: 3   Years of education: Not on file   Highest education level: Not on file  Occupational History   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 [place]  Tobacco Use   Smoking status: Former    Packs/day: 1.00    Years: 50.00    Total pack years: 50.00    Types: Cigarettes    Quit date: 07/20/2016  Years since quitting: 5.5   Smokeless tobacco: Never  Vaping Use   Vaping Use: Never used  Substance and Sexual Activity   Alcohol use: Not Currently    Alcohol/week: 0.0 standard drinks of alcohol   Drug use: No   Sexual activity: Yes  Other Topics Concern   Not on file  Social History Narrative   Married x 38 years.   Social Determinants of Health   Financial Resource Strain: Low Risk  (07/21/2021)   Overall Financial Resource Strain (CARDIA)    Difficulty of Paying Living Expenses: Not hard at all  Food Insecurity: No Food Insecurity (07/21/2021)   Hunger Vital Sign    Worried About Running Out of Food in the Last Year: Never true    Ran Out of Food in the Last Year: Never true  Transportation Needs: No Transportation Needs (07/21/2021)   PRAPARE - Hydrologist (Medical): No    Lack of Transportation (Non-Medical): No  Physical Activity: Inactive (07/21/2021)   Exercise Vital Sign    Days of Exercise per Week: 0 days    Minutes of Exercise per Session: 0 min  Stress: No Stress Concern Present (07/21/2021)   [place]    Feeling of Stress : Not at all  Social Connections:  Moderately Isolated (07/21/2021)   Social Connection and Isolation Panel [NHANES]    Frequency of Communication with Friends and Family: Once a week    Frequency of Social Gatherings with Friends and Family: More than three times a week    Attends Religious Services: Never    Marine scientist or Organizations: No    Attends Music therapist: Never    Marital Status: Married   Family History  Problem Relation Age of Onset   Bone cancer Mother    Heart attack Father    Scheduled Meds:  enoxaparin (LOVENOX) injection  40 mg Subcutaneous Q24H   Continuous Infusions:  dextrose 5 % and 0.9% NaCl 100 mL/hr at 01/30/22 1125   lactated ringers 125 mL/hr at 01/30/22 0754   PRN Meds:.morphine injection, mouth rinse, zolpidem Medications Prior to Admission:  Prior to Admission medications   Medication Sig Start Date End Date Taking? Authorizing Provider  amiodarone (PACERONE) 200 MG tablet Take 1 tablet (200 mg total) by mouth 2 (two) times daily. 01/18/22  Yes Gerlene Fee, NP  apixaban (ELIQUIS) 5 MG TABS tablet Take 1 tablet (5 mg total) by mouth 2 (two) times daily. 01/18/22  Yes Gerlene Fee, NP  clopidogrel (PLAVIX) 75 MG tablet Take 1 tablet (75 mg total) by mouth daily. 01/18/22  Yes Gerlene Fee, NP  insulin aspart (NOVOLOG) 100 UNIT/ML injection Inject 5 Units into the skin in the morning and at bedtime. 01/18/22  Yes Gerlene Fee, NP  montelukast (SINGULAIR) 10 MG tablet Take 1 tablet (10 mg total) by mouth daily. 01/18/22  Yes Gerlene Fee, NP  acetaminophen (TYLENOL) 500 MG tablet Take 500 mg by mouth every 6 (six) hours as needed (pain).    [provider]  albuterol (VENTOLIN HFA) 108 (90 Base) MCG/ACT inhaler Inhale 2 puffs into the lungs every 6 (six) hours as needed for wheezing or shortness of breath. 01/18/22   Gerlene Fee, NP  Amino Acids-Protein Hydrolys (PRO-STAT SUGAR FREE PO) Take 30 mLs by mouth with breakfast, with lunch, and with  evening meal.    [provider]  Fluticasone Furoate (ARNUITY ELLIPTA) 200 MCG/ACT AEPB Inhale 1 puff into the lungs daily. 01/18/22   Gerlene Fee, NP  furosemide (LASIX) 20 MG tablet Take 1 tablet (20 mg total) by mouth daily. Patient not taking: Reported on 01/30/2022 01/18/22   Gerlene Fee, NP  glucose blood (ONETOUCH ULTRA) test strip Use as instructed 10/27/21   [name], MD  Insulin Pen Needle (NOVOFINE PEN NEEDLE) 32G X 6 MM MISC 1 each by Does not apply route daily. 01/18/22   Gerlene Fee, NP  loperamide (IMODIUM) 2 MG capsule Take 1 capsule (2 mg total) by mouth every 6 (six) hours as needed for diarrhea or loose stools. 12/24/21   Kathie Dike, MD  mexiletine (MEXITIL) 150 MG capsule Take 2 capsules (300 mg total) by mouth every 12 (twelve) hours. 01/18/22   Gerlene Fee, NP  nitroGLYCERIN (NITROSTAT) 0.4 MG SL tablet Place 1 tablet (0.4 mg total) under the tongue every 5 (five) minutes as needed for chest pain. 01/18/22   Gerlene Fee, NP  potassium chloride SA (KLOR-CON M) 20 MEQ tablet Take 1 tablet (20 mEq total) by mouth daily. 01/18/22   Gerlene Fee, NP   Allergies  Allergen Reactions   Omega-3 Fatty Acids Hives and Itching   Benazepril Other (See Comments)    hyperkalemia   Fish Allergy Itching   Review of Systems  Unable to perform ROS: Acuity of condition    Physical Exam Vitals and nursing note reviewed.  Constitutional:      General: He is not in acute distress.    Appearance: He is ill-appearing.  HENT:     Mouth/Throat:     Mouth: Mucous membranes are moist.  Cardiovascular:     Rate and Rhythm: Normal rate.  Pulmonary:     Effort: Pulmonary effort is normal. No respiratory distress.  Skin:    General: Skin is warm and dry.     Coloration: Skin is pale.  Neurological:     Mental Status: He is alert and oriented to person, place, and time.  Psychiatric:        Mood and Affect: Mood normal.        Behavior: Behavior  normal.     Vital Signs: BP (!) 98/59 (BP Location: Left Arm)   Pulse 70   Temp (!) 97.5 F (36.4 C) (Oral)   Resp 20   Ht 6' (1.829 m)   Wt 70.7 kg   SpO2 98%   BMI 21.14 kg/m  Pain Scale: 0-10   Pain Score: 0-No pain   SpO2: SpO2: 98 % O2 Device:SpO2: 98 % O2 Flow Rate: .O2 Flow Rate (L/min): 0 L/min  IO: Intake/output summary:  Intake/Output Summary (Last 24 hours) at 01/30/2022 1321 Last data filed at 01/30/2022 0654 Gross per 24 hour  Intake 1683.47 ml  Output --  Net 1683.47 ml    LBM: Last BM Date : 01/29/22 Baseline Weight: Weight: 70.8 kg Most recent weight: Weight: 70.7 kg     Palliative Assessment/Data:   Flowsheet Rows    Flowsheet Row Most Recent Value  Intake Tab   Referral Department Hospitalist  Unit at Time of Referral Med/Surg Unit  Palliative Care Primary Diagnosis Other (Comment)  Date Notified 01/29/22  Palliative Care Type Return patient Palliative Care  Reason for referral Clarify Goals of Care  Date of Admission 01/29/22  Date first seen by Palliative Care 01/30/22  # of days Palliative referral response time 1 Day(s)  #  of days IP prior to Palliative referral 0  Clinical Assessment   Palliative Performance Scale Score 30%  Pain Max last 24 hours Not able to report  Pain Min Last 24 hours Not able to report  Dyspnea Max Last 24 Hours Not able to report  Dyspnea Min Last 24 hours Not able to report  Psychosocial & Spiritual Assessment   Palliative Care Outcomes        Time In: 1100 Time Out: 1215 Time Total: 75 minutes  Greater than 50%  of this time was spent counseling and coordinating care related to the above assessment and plan.  Signed by: Drue Novel, NP   Please contact Palliative Medicine Team phone at [phone] for questions and concerns.  For individual provider: See Shea Evans

## 2022-01-31 ENCOUNTER — Inpatient Hospital Stay: Payer: Medicare Other | Admitting: Family Medicine

## 2022-01-31 ENCOUNTER — Ambulatory Visit (HOSPITAL_COMMUNITY): Admission: RE | Admit: 2022-01-31 | Payer: Medicare Other | Source: Ambulatory Visit

## 2022-01-31 DIAGNOSIS — R627 Adult failure to thrive: Secondary | ICD-10-CM | POA: Diagnosis not present

## 2022-01-31 LAB — GLUCOSE, CAPILLARY
Glucose-Capillary: 132 mg/dL — ABNORMAL HIGH (ref 70–99)
Glucose-Capillary: 136 mg/dL — ABNORMAL HIGH (ref 70–99)
Glucose-Capillary: 142 mg/dL — ABNORMAL HIGH (ref 70–99)

## 2022-01-31 MED ORDER — HYDROMORPHONE HCL 1 MG/ML IJ SOLN
0.5000 mg | Freq: Once | INTRAMUSCULAR | Status: AC
  Administered 2022-01-31: 0.5 mg via INTRAVENOUS
  Filled 2022-01-31: qty 0.5

## 2022-01-31 MED ORDER — HYDROMORPHONE HCL 1 MG/ML IJ SOLN: 0.5000 mg | INTRAMUSCULAR | Status: DC | PRN

## 2022-01-31 NOTE — Plan of Care (Signed)
Problem: Education: Goal: Knowledge of General Education information will improve Description: Including pain rating scale, medication(s)/side effects and non-pharmacologic comfort measures 01/31/2022 1135 by Melony Overly, RN Outcome: Adequate for Discharge 01/31/2022 1135 by Melony Overly, RN Outcome: Adequate for Discharge   Problem: Health Behavior/Discharge Planning: Goal: Ability to manage health-related needs will improve 01/31/2022 1135 by Melony Overly, RN Outcome: Adequate for Discharge 01/31/2022 1135 by Melony Overly, RN Outcome: Adequate for Discharge   Problem: Clinical Measurements: Goal: Ability to maintain clinical measurements within normal limits will improve 01/31/2022 1135 by Melony Overly, RN Outcome: Adequate for Discharge 01/31/2022 1135 by Melony Overly, RN Outcome: Adequate for Discharge Goal: Will remain free from infection 01/31/2022 1135 by Melony Overly, RN Outcome: Adequate for Discharge 01/31/2022 1135 by Melony Overly, RN Outcome: Adequate for Discharge Goal: Diagnostic test results will improve 01/31/2022 1135 by Melony Overly, RN Outcome: Adequate for Discharge 01/31/2022 1135 by Melony Overly, RN Outcome: Adequate for Discharge Goal: Respiratory complications will improve 01/31/2022 1135 by Melony Overly, RN Outcome: Adequate for Discharge 01/31/2022 1135 by Melony Overly, RN Outcome: Adequate for Discharge Goal: Cardiovascular complication will be avoided 01/31/2022 1135 by Melony Overly, RN Outcome: Adequate for Discharge 01/31/2022 1135 by Melony Overly, RN Outcome: Adequate for Discharge   Problem: Activity: Goal: Risk for activity intolerance will decrease 01/31/2022 1135 by Melony Overly, RN Outcome: Adequate for Discharge 01/31/2022 1135 by Melony Overly, RN Outcome: Adequate for Discharge   Problem: Nutrition: Goal: Adequate nutrition will be maintained 01/31/2022 1135 by Melony Overly, RN Outcome:  Adequate for Discharge 01/31/2022 1135 by Melony Overly, RN Outcome: Adequate for Discharge   Problem: Coping: Goal: Level of anxiety will decrease 01/31/2022 1135 by Melony Overly, RN Outcome: Adequate for Discharge 01/31/2022 1135 by Melony Overly, RN Outcome: Adequate for Discharge   Problem: Elimination: Goal: Will not experience complications related to bowel motility 01/31/2022 1135 by Melony Overly, RN Outcome: Adequate for Discharge 01/31/2022 1135 by Melony Overly, RN Outcome: Adequate for Discharge Goal: Will not experience complications related to urinary retention 01/31/2022 1135 by Melony Overly, RN Outcome: Adequate for Discharge 01/31/2022 1135 by Melony Overly, RN Outcome: Adequate for Discharge   Problem: Pain Managment: Goal: General experience of comfort will improve 01/31/2022 1135 by Melony Overly, RN Outcome: Adequate for Discharge 01/31/2022 1135 by Melony Overly, RN Outcome: Adequate for Discharge   Problem: Safety: Goal: Ability to remain free from injury will improve 01/31/2022 1135 by Melony Overly, RN Outcome: Adequate for Discharge 01/31/2022 1135 by Melony Overly, RN Outcome: Adequate for Discharge   Problem: Education: Goal: Knowledge of General Education information will improve Description: Including pain rating scale, medication(s)/side effects and non-pharmacologic comfort measures 01/31/2022 1135 by Melony Overly, RN Outcome: Adequate for Discharge 01/31/2022 1135 by Melony Overly, RN Outcome: Adequate for Discharge   Problem: Skin Integrity: Goal: Risk for impaired skin integrity will decrease 01/31/2022 1135 by Melony Overly, RN Outcome: Adequate for Discharge 01/31/2022 1135 by Melony Overly, RN Outcome: Adequate for Discharge   Problem: Health Behavior/Discharge Planning: Goal: Ability to manage health-related needs will improve 01/31/2022 1135 by Melony Overly, RN Outcome: Adequate for Discharge 01/31/2022  1135 by Melony Overly, RN Outcome: Adequate for Discharge   Problem: Clinical Measurements: Goal: Ability to maintain clinical measurements within normal limits will improve 01/31/2022 1135 by Melony Overly,  RN Outcome: Adequate for Discharge 01/31/2022 1135 by Melony Overly, RN Outcome: Adequate for Discharge Goal: Will remain free from infection 01/31/2022 1135 by Melony Overly, RN Outcome: Adequate for Discharge 01/31/2022 1135 by Melony Overly, RN Outcome: Adequate for Discharge Goal: Diagnostic test results will improve 01/31/2022 1135 by Melony Overly, RN Outcome: Adequate for Discharge 01/31/2022 1135 by Melony Overly, RN Outcome: Adequate for Discharge Goal: Respiratory complications will improve 01/31/2022 1135 by Melony Overly, RN Outcome: Adequate for Discharge 01/31/2022 1135 by Melony Overly, RN Outcome: Adequate for Discharge Goal: Cardiovascular complication will be avoided 01/31/2022 1135 by Melony Overly, RN Outcome: Adequate for Discharge 01/31/2022 1135 by Melony Overly, RN Outcome: Adequate for Discharge   Problem: Activity: Goal: Risk for activity intolerance will decrease 01/31/2022 1135 by Melony Overly, RN Outcome: Adequate for Discharge 01/31/2022 1135 by Melony Overly, RN Outcome: Adequate for Discharge   Problem: Nutrition: Goal: Adequate nutrition will be maintained 01/31/2022 1135 by Melony Overly, RN Outcome: Adequate for Discharge 01/31/2022 1135 by Melony Overly, RN Outcome: Adequate for Discharge   Problem: Coping: Goal: Level of anxiety will decrease 01/31/2022 1135 by Melony Overly, RN Outcome: Adequate for Discharge 01/31/2022 1135 by Melony Overly, RN Outcome: Adequate for Discharge   Problem: Elimination: Goal: Will not experience complications related to bowel motility 01/31/2022 1135 by Melony Overly, RN Outcome: Adequate for Discharge 01/31/2022 1135 by Melony Overly, RN Outcome: Adequate for  Discharge Goal: Will not experience complications related to urinary retention 01/31/2022 1135 by Melony Overly, RN Outcome: Adequate for Discharge 01/31/2022 1135 by Melony Overly, RN Outcome: Adequate for Discharge   Problem: Pain Managment: Goal: General experience of comfort will improve 01/31/2022 1135 by Melony Overly, RN Outcome: Adequate for Discharge 01/31/2022 1135 by Melony Overly, RN Outcome: Adequate for Discharge   Problem: Safety: Goal: Ability to remain free from injury will improve 01/31/2022 1135 by Melony Overly, RN Outcome: Adequate for Discharge 01/31/2022 1135 by Melony Overly, RN Outcome: Adequate for Discharge   Problem: Skin Integrity: Goal: Risk for impaired skin integrity will decrease 01/31/2022 1135 by Melony Overly, RN Outcome: Adequate for Discharge 01/31/2022 1135 by Melony Overly, RN Outcome: Adequate for Discharge

## 2022-01-31 NOTE — Discharge Summary (Signed)
Physician Discharge Summary  John [name] ZOX:[id number] DOB: 04-15-1951 DOA: 01/29/2022  PCP: [name], MD  Admit date: 01/29/2022 Discharge date: 01/31/2022  Admitted From:home Disposition:  nursing home  Recommendations for Outpatient Follow-up:  Follow up with PCP in 1-2 weeks Please obtain BMP/CBC in one week  Home Health:none Equipment/Devices:none Discharge Condition:fair CODE STATUS:dnr Diet recommendation:regular Brief/Interim Summary: 71 year old male with history of lung cancer CAD DVT type 2 diabetes hyperlipidemia essential hypertension GERD lung cancer has received radiation therapy.  Family brought him in as patient had uncontrolled diarrhea and generalized weakness and was unable to stand on his feet.  Patient has made up his mind not to have any aggressive therapy.  Palliative care consulted.  Patient is DNR.   Discharge Diagnoses:  Principal Problem:   FTT (failure to thrive) in adult Active Problems:   Malignant neoplasm of upper lobe of left lung (HCC)   COPD (chronic obstructive pulmonary disease) (HCC)   Type 2 diabetes mellitus with peripheral autonomic neuropathy (HCC)   HTN (hypertension)   GERD (gastroesophageal reflux disease)   CAD, multiple prior RCA PCI's. Last cath 2010, Myoview low risk Oct 2012   Hypertension   Chronic systolic CHF (congestive heart failure) (HCC)   Atrial fibrillation, chronic (HCC)   Generalized weakness   Diabetic peripheral neuropathy associated with type 2 diabetes mellitus ([place])   Hyperlipidemia associated with type 2 diabetes mellitus ([place])   Current long-term use of anticoagulant medication with history of deep venous thrombosis (DVT)   Severe protein-calorie malnutrition (HCC)   Elevated liver enzymes   AKI (acute kidney injury) ([place])   Diarrhea   #1 failure to thrive patient with multiple comorbidities including lung cancer.  He does not want any aggressive measures and has requested  palliative care and  possibly hospice.   #2 hypoglycemia resolved.  I have stopped the insulin he was taking prior to admission.   #3 history of DVT on Eliquis    #4 history of CAD MI on multiple medications   #5 history of essential hypertension blood pressure still soft but improved off of antihypertensives.  #6 goals of care patient is DNR  #7 AKI due to decreased p.o. intake and diarrhea resolved with IV fluids.      Estimated body mass index is 21.14 kg/m as calculated from the following:   Height as of this encounter: 6' (1.829 m).   Weight as of this encounter: 70.7 kg.  Discharge Instructions  Discharge Instructions     Diet - low sodium heart healthy   Complete by: As directed    Discharge wound care:   Complete by: As directed    See notes   Increase activity slowly   Complete by: As directed       Allergies as of 01/31/2022       Reactions   Omega-3 Fatty Acids Hives, Itching   Benazepril Other (See Comments)   hyperkalemia   Fish Allergy Itching        Medication List     STOP taking these medications    acetaminophen 500 MG tablet Commonly known as: TYLENOL   amiodarone 200 MG tablet Commonly known as: PACERONE   furosemide 20 MG tablet Commonly known as: LASIX   insulin aspart 100 UNIT/ML injection Commonly known as: NovoLOG   nitroGLYCERIN 0.4 MG SL tablet Commonly known as: NITROSTAT   Novofine Pen Needle 32G X 6 MM Misc Generic drug: Insulin Pen Needle   OneTouch Ultra test strip Generic  drug: glucose blood   potassium chloride SA 20 MEQ tablet Commonly known as: KLOR-CON M   PRO-STAT SUGAR FREE PO       TAKE these medications    albuterol 108 (90 Base) MCG/ACT inhaler Commonly known as: VENTOLIN HFA Inhale 2 puffs into the lungs every 6 (six) hours as needed for wheezing or shortness of breath.   apixaban 5 MG Tabs tablet Commonly known as: Eliquis Take 1 tablet (5 mg total) by mouth 2 (two) times daily.   Arnuity Ellipta 200  MCG/ACT Aepb Generic drug: Fluticasone Furoate Inhale 1 puff into the lungs daily.   clopidogrel 75 MG tablet Commonly known as: PLAVIX Take 1 tablet (75 mg total) by mouth daily.   loperamide 2 MG capsule Commonly known as: IMODIUM Take 1 capsule (2 mg total) by mouth every 6 (six) hours as needed for diarrhea or loose stools.   mexiletine 150 MG capsule Commonly known as: MEXITIL Take 2 capsules (300 mg total) by mouth every 12 (twelve) hours.   montelukast 10 MG tablet Commonly known as: SINGULAIR Take 1 tablet (10 mg total) by mouth daily.               Discharge Care Instructions  (From admission, onward)           Start     Ordered   01/31/22 0000  Discharge wound care:       Comments: See notes   01/31/22 1052            Allergies  Allergen Reactions   Omega-3 Fatty Acids Hives and Itching   Benazepril Other (See Comments)    hyperkalemia   Fish Allergy Itching    Consultations: palliative  Procedures/Studies: No results found. (Echo, Carotid, EGD, Colonoscopy, ERCP)    Subjective: No specific complaints denies diarrhea  Discharge Exam: Vitals:   01/30/22 2100 01/31/22 0513  BP: 103/61 97/62  Pulse: 74 69  Resp: 20 18  Temp: 97.8 F (36.6 C) 97.8 F (36.6 C)  SpO2: 97% 92%   Vitals:   01/30/22 0455 01/30/22 1559 01/30/22 2100 01/31/22 0513  BP: (!) 98/59 (!) 102/57 103/61 97/62  Pulse: 70 71 74 69  Resp: 20 20 20 18   Temp: (!) 97.5 F (36.4 C) (!) 97.5 F (36.4 C) 97.8 F (36.6 C) 97.8 F (36.6 C)  TempSrc: Oral Oral Oral Oral  SpO2: 98% 98% 97% 92%  Weight:      Height:        General: Pt is alert, awake, not in acute distress Cardiovascular: RRR, S1/S2 +, no rubs, no gallops Respiratory: CTA bilaterally, no wheezing, no rhonchi Abdominal: Soft, NT, ND, bowel sounds + Extremities: LLE edema, no cyanosis    The results of significant diagnostics from this hospitalization (including imaging, microbiology,  ancillary and laboratory) are listed below for reference.     Microbiology: No results found for this or any previous visit (from the past 240 hour(s)).   Labs: BNP (last 3 results) Recent Labs    11/05/21 0507 12/22/21 1722  BNP 280.9* 102.7*   Basic Metabolic Panel: Recent Labs  Lab 01/29/22 1834 01/30/22 0439  NA 136 139  K 3.7 4.3  CL 109 112*  CO2 21* 20*  GLUCOSE 105* 48*  BUN 36* 33*  CREATININE 1.46* 1.25*  CALCIUM 8.0* 8.0*   Liver Function Tests: Recent Labs  Lab 01/29/22 1834 01/30/22 0439  AST 66* 77*  ALT 36 37  ALKPHOS 974* 558*  BILITOT  0.8 0.6  PROT 5.0* 4.8*  ALBUMIN 1.8* 1.7*   No results for input(s): "LIPASE", "AMYLASE" in the last 168 hours. No results for input(s): "AMMONIA" in the last 168 hours. CBC: Recent Labs  Lab 01/29/22 1834 01/30/22 0439  WBC 10.7* 10.4  HGB 9.7* 8.5*  HCT 32.6* 29.8*  MCV 81.5 83.9  PLT 345 320   Cardiac Enzymes: No results for input(s): "CKTOTAL", "CKMB", "CKMBINDEX", "TROPONINI" in the last 168 hours. BNP: Invalid input(s): "POCBNP" CBG: Recent Labs  Lab 01/30/22 0718 01/30/22 1144 01/31/22 0507 01/31/22 0723  GLUCAP 67* 77 132* 136*   D-Dimer No results for input(s): "DDIMER" in the last 72 hours. Hgb A1c No results for input(s): "HGBA1C" in the last 72 hours. Lipid Profile No results for input(s): "CHOL", "HDL", "LDLCALC", "TRIG", "CHOLHDL", "LDLDIRECT" in the last 72 hours. Thyroid function studies No results for input(s): "TSH", "T4TOTAL", "T3FREE", "THYROIDAB" in the last 72 hours.  Invalid input(s): "FREET3" Anemia work up No results for input(s): "VITAMINB12", "FOLATE", "FERRITIN", "TIBC", "IRON", "RETICCTPCT" in the last 72 hours. Urinalysis    Component Value Date/Time   COLORURINE YELLOW 01/18/2021 0950   APPEARANCEUR CLEAR 01/18/2021 0950   LABSPEC 1.024 01/18/2021 0950   PHURINE 5.0 01/18/2021 0950   GLUCOSEU >=500 (A) 01/18/2021 0950   HGBUR NEGATIVE 01/18/2021 0950    BILIRUBINUR NEGATIVE 01/18/2021 0950   KETONESUR 5 (A) 01/18/2021 0950   PROTEINUR NEGATIVE 01/18/2021 0950   UROBILINOGEN 1.0 02/27/2012 0001   NITRITE NEGATIVE 01/18/2021 0950   LEUKOCYTESUR NEGATIVE 01/18/2021 0950   Sepsis Labs Recent Labs  Lab 01/29/22 1834 01/30/22 0439  WBC 10.7* 10.4   Microbiology No results found for this or any previous visit (from the past 240 hour(s)).   Time coordinating discharge:  39 minutes  SIGNED:   Georgette Shell, MD  [facility] 01/31/2022, 10:53 AM

## 2022-01-31 NOTE — TOC Transition Note (Signed)
Transition of Care [facility]) - CM/SW Discharge Note   Patient Details  Name: John [name] MRN: [id number] Date of Birth: Jan 10, 1951  [facility] CM/SW Contact:  Shade Flood, LCSW Phone Number: 01/31/2022, 1:04 PM   Clinical Narrative:     Pt stable for dc to [facility] today per MD. Marianna Fuss at [facility] updated. Pt's wife has made payment and brought in pt's CPAP and [facility] is prepared to accept pt. Updated RN. DC clinical sent electronically. RN to call report.  There are no other TOC needs for dc.  Final next level of care: Long Term Nursing Home Barriers to Discharge: Barriers Resolved   Patient Goals and CMS Choice Patient states their goals for this hospitalization and ongoing recovery are:: go to LTC NH CMS Medicare.gov Compare Post Acute Care list provided to:: Patient Represenative (must comment) Choice offered to / list presented to : Spouse, Adult Children, Patient  Discharge Placement              Patient [facility] bed at: [facility] Patient to be transferred to facility by: w/c Name of family member notified: [name] Patient and family notified of of transfer: 01/31/22  Discharge Plan and Services In-house Referral: Clinical Social Work   Post Acute Care Choice: Nursing Home                               Social Determinants of Health (SDOH) Interventions     Readmission Risk Interventions    01/30/2022   11:40 AM 04/22/2021   12:09 PM 02/25/2021    3:57 PM  Readmission Risk Prevention Plan  Transportation Screening Complete Complete Complete  PCP or Specialist Appt within 3-5 Days  Complete Complete  HRI or Home Care Consult  Complete Complete  Social Work Consult for [place] Planning/Counseling  Complete Complete  Palliative Care Screening  Not Applicable Not Applicable  Medication Review Press photographer) Complete Complete Complete  HRI or Home Care Consult Complete    SW Recovery Care/Counseling Consult Complete     Palliative Care Screening Complete    Skilled Nursing Facility Complete

## 2022-02-01 ENCOUNTER — Encounter: Payer: Self-pay | Admitting: Adult Health

## 2022-02-01 ENCOUNTER — Non-Acute Institutional Stay (SKILLED_NURSING_FACILITY): Payer: Medicare Other | Admitting: Adult Health

## 2022-02-01 DIAGNOSIS — E1169 Type 2 diabetes mellitus with other specified complication: Secondary | ICD-10-CM

## 2022-02-01 DIAGNOSIS — I700 Atherosclerosis of aorta: Secondary | ICD-10-CM

## 2022-02-01 DIAGNOSIS — C3412 Malignant neoplasm of upper lobe, left bronchus or lung: Secondary | ICD-10-CM

## 2022-02-01 DIAGNOSIS — E1143 Type 2 diabetes mellitus with diabetic autonomic (poly)neuropathy: Secondary | ICD-10-CM | POA: Diagnosis not present

## 2022-02-01 DIAGNOSIS — I482 Chronic atrial fibrillation, unspecified: Secondary | ICD-10-CM | POA: Diagnosis not present

## 2022-02-01 DIAGNOSIS — E1142 Type 2 diabetes mellitus with diabetic polyneuropathy: Secondary | ICD-10-CM

## 2022-02-01 DIAGNOSIS — E785 Hyperlipidemia, unspecified: Secondary | ICD-10-CM

## 2022-02-01 DIAGNOSIS — I110 Hypertensive heart disease with heart failure: Secondary | ICD-10-CM | POA: Diagnosis not present

## 2022-02-01 DIAGNOSIS — I714 Abdominal aortic aneurysm, without rupture, unspecified: Secondary | ICD-10-CM | POA: Diagnosis not present

## 2022-02-01 DIAGNOSIS — I25118 Atherosclerotic heart disease of native coronary artery with other forms of angina pectoris: Secondary | ICD-10-CM

## 2022-02-01 DIAGNOSIS — G4733 Obstructive sleep apnea (adult) (pediatric): Secondary | ICD-10-CM

## 2022-02-01 DIAGNOSIS — R627 Adult failure to thrive: Secondary | ICD-10-CM | POA: Diagnosis not present

## 2022-02-01 DIAGNOSIS — E43 Unspecified severe protein-calorie malnutrition: Secondary | ICD-10-CM

## 2022-02-01 DIAGNOSIS — I5022 Chronic systolic (congestive) heart failure: Secondary | ICD-10-CM | POA: Diagnosis not present

## 2022-02-01 NOTE — Progress Notes (Signed)
Location:  [place] Room Number: NO/S151/P Place of Service:  SNF (31)  Eternity Dexter S.,NP  CODE STATUS: DNR  Allergies  Allergen Reactions   Omega-3 Fatty Acids Hives and Itching   Benazepril Other (See Comments)    hyperkalemia   Fish Allergy Itching    Chief Complaint  Patient presents with   Hospitalization Follow-up    Patient is here for follow up after hospital stay     HPI:  He is a 71 year old man who has been hospitalized from 01-29-22 through 01-31-22. His medical history includes: lung cancer (status post radiation therapy); liver metastasis; CAD; GERD. He had recently been discharged home the first night he had a fall. He was brought in by family to the ED due to uncontrolled diarrhea and generalized weakness; he was unable to stand up. He does not want any aggressive care.  Failure to thrive with multiple comorbidities ; including lung cancer; has requested palliative consult versus hospice care consult.  Hypoglycemia: his insulin was stopped.  Diarrhea; resolved did receive IVF to resolve AKI.  More than likely this does represent a long term placement for him. He is asking about hospice services. He does understand that his wife cannot provide the care that he needs. He will continue to be followed for his chronic illnesses including:   Atrial fibrillation, chronic: Coronary artery disease of native artery of native heart with stable angina:   Chronic systolic CHF (congestive heart failure) Hypertensive heart disease with chronic systolic congestive heart failure     Past Medical History:  Diagnosis Date   Arm DVT (deep venous thromboembolism), acute, left (HCC)    while off eliquis for bronchoscopy 2022   Atrial flutter (HCC)    s/p cardioversion   Coronary artery disease    Diabetes mellitus    GERD (gastroesophageal reflux disease)    History of nuclear stress test 04/04/2011   lexiscan; mod-large in size fixed inferolateral  defect (scar); non-diagnostic for ischemia; low risk scan    Hyperlipidemia    Hypertension    Ischemic cardiomyopathy    Left foot drop    r/t past disk srugery - uses Kevlar brace   Lung cancer ([place]) 04/26/2021   Myocardial infarction [facility])    posterior MI   Osteomyelitis ([place])    s/p left 2nd toe amputation in 01/2021   Pulmonary nodule    Recurrent ventricular tachycardia (HCC)    Shortness of breath    Sleep apnea    on CPAP; 04/28/2007 split-night - AHI during total sleep 44.43/hr and REM 72.56/hr    Past Surgical History:  Procedure Laterality Date   AMPUTATION TOE Left 02/10/2021   Procedure: AMPUTATION  LEFT SECOND TOE;  Surgeon: Edrick Kins, DPM;  Location: [place];  Service: Podiatry;  Laterality: Left;   Seth Ward   BRONCHIAL BIOPSY  04/26/2021   Procedure: BRONCHIAL BIOPSIES;  Surgeon: Garner Nash, DO;  Location: [place] [facility];  Service: Pulmonary;;   BRONCHIAL BRUSHINGS  04/26/2021   Procedure: BRONCHIAL BRUSHINGS;  Surgeon: Garner Nash, DO;  Location: [place] [facility];  Service: Pulmonary;;   BRONCHIAL NEEDLE ASPIRATION BIOPSY  04/26/2021   Procedure: BRONCHIAL NEEDLE ASPIRATION BIOPSIES;  Surgeon: Garner Nash, DO;  Location: [place];  Service: Pulmonary;;   CARDIAC CATHETERIZATION  2010   6 stents total   CARDIAC CATHETERIZATION  01/2000   percutaneous transluminal coronary balloon angioplasty of mid RCA stenotic lesion   CARDIAC CATHETERIZATION  06/2006   no stenting; ischemic cardiomyopathy, EF 40-45%   CARDIOVERSION N/A 07/28/2016   Procedure: CARDIOVERSION;  Surgeon: Troy Sine, MD;  Location: [place];  Service: Cardiovascular;  Laterality: N/A;   CORONARY ANGIOPLASTY  09/1998   mid-distal RCA balloon dilatation, 4.5 & 5.0 stents    CORONARY ANGIOPLASTY WITH STENT PLACEMENT  03/1994   angioplasty & stenting (non-DES) of circumflex/prox ramus intermedius   CORONARY ANGIOPLASTY WITH STENT PLACEMENT  10/1994   large iliac [id number] stent  to RCA   [place]  12/2002   4.58m stents x2 of RCA   CORONARY ANGIOPLASTY WITH STENT PLACEMENT  01/2005   cutting balloon arthrectomy of distal RCA & Cypher DES 3.5x13; cutting balloon arthrectomy of mid RCA with Cypher DES 3.5x18   CORONARY ANGIOPLASTY WITH STENT PLACEMENT  11/2008   stenting of mid RCA with 4.0x186mdriver, non-DES   CORONARY BALLOON ANGIOPLASTY N/A 02/15/2021   Procedure: CORONARY BALLOON ANGIOPLASTY;  Surgeon: KeTroy SineMD;  [facility];  Service: Cardiovascular;  Laterality: N/A;   FIDUCIAL MARKER PLACEMENT  04/26/2021   Procedure: FIDUCIAL MARKER PLACEMENT;  Surgeon: IcGarner NashDO;  [facility];  Service: Pulmonary;;   ICD IMPLANT N/A 08/20/2020   Procedure: ICD IMPLANT;  Surgeon: CaConstance HawMD;  [facility];  Service: Cardiovascular;  Laterality: N/A;   INTRAVASCULAR PRESSURE WIRE/FFR STUDY N/A 03/02/2020   Procedure: INTRAVASCULAR PRESSURE WIRE/FFR STUDY;  Surgeon: HaLeonie ManMD;  [facility];  Service: Cardiovascular;  Laterality: N/A;   LEFT HEART CATH AND CORONARY ANGIOGRAPHY N/A 03/02/2020   Procedure: LEFT HEART CATH AND CORONARY ANGIOGRAPHY;  Surgeon: HaLeonie ManMD;  [facility];  Service: Cardiovascular;  Laterality: N/A;   LEFT HEART CATH AND CORONARY ANGIOGRAPHY N/A 08/19/2020   Procedure: LEFT HEART CATH AND CORONARY ANGIOGRAPHY;  Surgeon: BeLorretta HarpMD;  [facility];  Service: Cardiovascular;  Laterality: N/A;   LEFT HEART CATH AND CORONARY ANGIOGRAPHY N/A 02/15/2021   Procedure: LEFT HEART CATH AND CORONARY ANGIOGRAPHY;  Surgeon: KeTroy SineMD;  [facility];  Service: Cardiovascular;  Laterality: N/A;   LEFT HEART CATHETERIZATION WITH CORONARY ANGIOGRAM N/A 02/27/2012   Procedure: LEFT HEART CATHETERIZATION WITH CORONARY ANGIOGRAM;  Surgeon: JoLorretta HarpMD;  [facility];  Service: Cardiovascular;  Laterality: N/A;   TRANSTHORACIC ECHOCARDIOGRAM  07/29/2010   EF 50=55%, mod inf wall hypokinesis & mild post wall hypokinesis; LA mild-mod dilated; mild mitral annular calcif & mild MR; mild TR & elevated RV systolic pressure; AV mildly sclerotic; mild aortic root dilatation    V TACH ABLATION N/A 01/20/2021   Procedure: V TACH ABLATION;  Surgeon: LaVickie EpleyMD;  [facility];  Service: Cardiovascular;  Laterality: N/A;   VIDEO BRONCHOSCOPY WITH ENDOBRONCHIAL NAVIGATION Left 04/26/2021   Procedure: VIDEO BRONCHOSCOPY WITH ENDOBRONCHIAL NAVIGATION;  Surgeon: IcGarner NashDO;  [facility] Service: Pulmonary;  Laterality: Left;  ION w/ fiducial   VIDEO BRONCHOSCOPY WITH RADIAL ENDOBRONCHIAL ULTRASOUND  04/26/2021   Procedure: RADIAL ENDOBRONCHIAL ULTRASOUND;  Surgeon: IcGarner NashDO;  [facility];  Service: Pulmonary;;    Social History   Socioeconomic History   Marital status: Married    Spouse name: BeInez Catalina Number of children: 3   Years of education: Not on file   Highest education  level: Not on file  Occupational History   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 [place]  Tobacco Use   Smoking status: Former    Packs/day: 1.00    Years: 50.00    Total pack years: 50.00    Types: Cigarettes    Quit date: 07/20/2016    Years since quitting: 5.5   Smokeless tobacco: Never  Vaping Use   Vaping Use: Never used  Substance and Sexual Activity   Alcohol use: Not Currently    Alcohol/week: 0.0 standard drinks of alcohol   Drug use: No   Sexual activity: Yes  Other Topics Concern   Not on file  Social History Narrative   Married x 38 years.   Social Determinants of Health   Financial Resource Strain: Low Risk  (07/21/2021)   Overall Financial Resource Strain (CARDIA)    Difficulty of Paying Living Expenses: Not hard at all  Food Insecurity: No Food  Insecurity (07/21/2021)   Hunger Vital Sign    Worried About Running Out of Food in the Last Year: Never true    Ran Out of Food in the Last Year: Never true  Transportation Needs: No Transportation Needs (07/21/2021)   PRAPARE - Hydrologist (Medical): No    Lack of Transportation (Non-Medical): No  Physical Activity: Inactive (07/21/2021)   Exercise Vital Sign    Days of Exercise per Week: 0 days    Minutes of Exercise per Session: 0 min  Stress: No Stress Concern Present (07/21/2021)   [place]    Feeling of Stress : Not at all  Social Connections: Moderately Isolated (07/21/2021)   Social Connection and Isolation Panel [NHANES]    Frequency of Communication with Friends and Family: Once a week    Frequency of Social Gatherings with Friends and Family: More than three times a week    Attends Religious Services: Never    Marine scientist or Organizations: No    Attends Archivist Meetings: Never    Marital Status: Married  Human resources officer Violence: Not At Risk (07/21/2021)   Humiliation, Afraid, Rape, and Kick questionnaire    Fear of Current or Ex-Partner: No    Emotionally Abused: No    Physically Abused: No    Sexually Abused: No   Family History  Problem Relation Age of Onset   Bone cancer Mother    Heart attack Father       VITAL SIGNS BP 108/70   Pulse 70   Temp (!) 97.2 F (36.2 C)   Resp 20   Ht 6' (1.829 m)   Wt 164 lb (74.4 kg)   SpO2 92%   BMI 22.24 kg/m   Outpatient Encounter Medications as of 02/01/2022  Medication Sig   albuterol (VENTOLIN HFA) 108 (90 Base) MCG/ACT inhaler Inhale 2 puffs into the lungs every 6 (six) hours as needed for wheezing or shortness of breath.   apixaban (ELIQUIS) 5 MG TABS tablet Take 1 tablet (5 mg total) by mouth 2 (two) times daily.   clopidogrel (PLAVIX) 75 MG tablet Take 1 tablet (75 mg total) by mouth daily.    Fluticasone Furoate (ARNUITY ELLIPTA) 200 MCG/ACT AEPB Inhale 1 puff into the lungs daily.   loperamide (IMODIUM) 2 MG capsule Take 1 capsule (2 mg total) by mouth every 6 (six) hours as needed for diarrhea or loose stools.  mexiletine (MEXITIL) 150 MG capsule Take 2 capsules (300 mg total) by mouth every 12 (twelve) hours.   montelukast (SINGULAIR) 10 MG tablet Take 1 tablet (10 mg total) by mouth daily.   No facility-administered encounter medications on file as of 02/01/2022.     SIGNIFICANT DIAGNOSTIC EXAMS   REVIEWED PREVIOUS   11-05-21: ct of chest 1. The masslike opacity in the left upper lobe identified on today's x-ray is new compared to August 02, 2021 and obscures the site of the previously treated malignancy. The overall patchy appearance, lack of masslike findings, and presence of air bronchograms suggests this finding is not likely to represent recurrence. Radiation changes are most likely. Pneumonia could have this appearance but is less likely unless the patient has acute symptoms of pneumonia. Recommend attention on short-term follow-up. 2. 2 new nodular regions in the left lower lobe. These findings could represent metastatic disease, developing infiltrate, or focal atelectasis. Recommend attention on short-term follow-up. 3. New masses in the liver are very concerning for metastatic disease given history and development since February 2023. 4. Healing fractures of the medial clavicles new since February 2023. The sclerosis in multiple adjacent left anterolateral ribs are likely healing fractures as well. 5. Aneurysmal dilatation of the ascending thoracic aorta measures 4.5 cm, stable. 6. Calcified atherosclerotic changes in the thoracic aorta. Coronary artery disease, unchanged. Aortic Atherosclerosis   01-04-22: liver ultrasound:  Multiple liver lesions are seen suspicious for metastatic disease   TODAY  12-22-21: chest x-ray:  1. No acute findings.  Stable left  perihilar opacity from prior. 2. Left-sided pacemaker in place.  Unchanged mild cardiomegaly.   LABS REVIEWED  PREVIOUS    12-13-21: tsh 1.500 12-22-21: wbc 7.8; hgb 9.7; hct 32.5; mcv 82.1 plt 349; glucose 93; bun 28; creat 1.47; k+ 2.7; na++ 140; ca 8.3; gfr 50; mag 2.1; bmp 355.0 12-23-21: wbc 6.4; hgb 9.0 ;hct 31.1; mcv 83.4 plt 304; glucose 154; bun 24; creat 1.30; k+ 3.2; na++ 142; ca 8.1; gfr 58; protein 5.0 albumin 1.7; ast 72; alt 46; alk phos 507; hgb a1c 6.3 12-24-21: glucose 109; bun 21; creat 1.123; k+ 3.8; na++ 142; ca 8.1; gfr >60 phos 2.1; albumin 2.1  01-02-22: wbc 8.5; hgb 8.8; hct 29.1; mcv 81.5 plt 371; glucose 113; bun 24; creat 1.20; k+ 3.5; na++ 140; ca 8.4; gfr>60 protein 5.3; albumin 2.1 ast 51; alk phos 537   TODAY  01-30-22: wbc 10.4; hgb 8.5; hct 29.8; mcv 83.9 plt 320; glucose 48; bun 33; creat 1.25; k+ 4.3; na++ 139; ca 8.0; gfr >60; protein 4.8 albumin 1.7; alk phos 558; ast 77.   Review of Systems  Constitutional:  Negative for malaise/fatigue.  Respiratory:  Negative for cough and shortness of breath.   Cardiovascular:  Negative for chest pain, palpitations and leg swelling.  Gastrointestinal:  Negative for abdominal pain, constipation and heartburn.  Musculoskeletal:  Negative for back pain, joint pain and myalgias.  Skin: Negative.   Neurological:  Negative for dizziness.  Psychiatric/Behavioral:  The patient is not nervous/anxious.    Physical Exam Constitutional:      General: He is not in acute distress.    Appearance: He is well-developed. He is not diaphoretic.  HENT:     Head: Atraumatic.  Neck:     Thyroid: No thyromegaly.  Cardiovascular:     Rate and Rhythm: Normal rate and regular rhythm.     Pulses: Normal pulses.     Heart sounds: Normal heart sounds.  Comments: ICD placed  Pulmonary:     Effort: Pulmonary effort is normal. No respiratory distress.     Breath sounds: Normal breath sounds.  Chest:     Comments: Barrel chest   Abdominal:     General: Bowel sounds are normal. There is no distension.     Palpations: Abdomen is soft.     Tenderness: There is no abdominal tenderness.  Musculoskeletal:        General: Normal range of motion.     Cervical back: Neck supple.     Right lower leg: No edema.     Left lower leg: No edema.     Comments:   Status post amputation left second toe  Lymphadenopathy:     Cervical: No cervical adenopathy.  Skin:    General: Skin is warm and dry.  Neurological:     Mental Status: He is alert and oriented to person, place, and time.  Psychiatric:        Mood and Affect: Mood normal.      ASSESSMENT/ PLAN:   TODAY  Generalized weakness/recurrent falls: will setup a palliative care consult.   2. Atrial fibrillation, chronic: is status post ICD placement; will continue eliquis 5 mg twice daily is off amiodarone   3. Coronary artery disease of native artery of native heart with stable angina: will continue plavix 75 mg daily has prn ntg  4. Chronic systolic CHF (congestive heart failure) EF 25-30% (04-18-21) is off medications will monitor   5. Hypertensive heart disease with chronic systolic congestive heart failure: b/p 108/70 will monitor   6. Chronic obstructive pulmonary disease unspecified COPD type: will continue arnuity ellipta 200 mcg daily; has albuterol 2 puffs every 6 hours as needed singulair 10 mg daily   7. Malignant neoplasm  of upper lobe of left lung/ liver metastatic disease: is not wanting any further aggressive therapies   8.  OSA (obstructive sleep apnea) does not use CPAP  9. Hypokalemia: discharge k+ 4.3 will monitor    10. Diabetic peripheral neuropathy associated with type 2 diabetes mellitus: will continue mexitil 300 mg twice daily for neuropathic pain   11. Type 2 diabetes mellitus with peripheral autonomic neuropathy: hgb a1c 6.3 is off insulin due to hypoglycemia  12. Hyperlipidemia associated with type 2 diabetes mellitus: is off  crestor    13. Current long term use of anticoagulation medication with history of deep vein thrombosis (DVT) is on long term eliquis therapy   14. Severe protein calorie malnutrition protein 4.8  albumin 1.7 will begin prostat three times daily with meals.   15. Aortic atherosclerosis ( 11-05-21 ct) is now off statin  16. Abdominal aortic aneurysm without rupture unspecified  part  (AAA)      Ok Edwards NP [facility]   call [phone]  \

## 2022-02-02 ENCOUNTER — Non-Acute Institutional Stay (SKILLED_NURSING_FACILITY): Payer: Medicare Other | Admitting: Internal Medicine

## 2022-02-02 ENCOUNTER — Encounter: Payer: Self-pay | Admitting: Internal Medicine

## 2022-02-02 ENCOUNTER — Ambulatory Visit: Payer: Self-pay | Admitting: *Deleted

## 2022-02-02 DIAGNOSIS — E1143 Type 2 diabetes mellitus with diabetic autonomic (poly)neuropathy: Secondary | ICD-10-CM

## 2022-02-02 DIAGNOSIS — G4733 Obstructive sleep apnea (adult) (pediatric): Secondary | ICD-10-CM | POA: Diagnosis not present

## 2022-02-02 DIAGNOSIS — I639 Cerebral infarction, unspecified: Secondary | ICD-10-CM | POA: Diagnosis not present

## 2022-02-02 DIAGNOSIS — E119 Type 2 diabetes mellitus without complications: Secondary | ICD-10-CM

## 2022-02-02 DIAGNOSIS — C787 Secondary malignant neoplasm of liver and intrahepatic bile duct: Secondary | ICD-10-CM | POA: Diagnosis not present

## 2022-02-02 DIAGNOSIS — E43 Unspecified severe protein-calorie malnutrition: Secondary | ICD-10-CM | POA: Diagnosis not present

## 2022-02-02 DIAGNOSIS — E1169 Type 2 diabetes mellitus with other specified complication: Secondary | ICD-10-CM | POA: Diagnosis not present

## 2022-02-02 DIAGNOSIS — I5042 Chronic combined systolic (congestive) and diastolic (congestive) heart failure: Secondary | ICD-10-CM | POA: Diagnosis not present

## 2022-02-02 DIAGNOSIS — I700 Atherosclerosis of aorta: Secondary | ICD-10-CM | POA: Diagnosis not present

## 2022-02-02 DIAGNOSIS — I10 Essential (primary) hypertension: Secondary | ICD-10-CM | POA: Diagnosis not present

## 2022-02-02 DIAGNOSIS — E785 Hyperlipidemia, unspecified: Secondary | ICD-10-CM | POA: Diagnosis not present

## 2022-02-02 DIAGNOSIS — I2510 Atherosclerotic heart disease of native coronary artery without angina pectoris: Secondary | ICD-10-CM | POA: Diagnosis not present

## 2022-02-02 DIAGNOSIS — I480 Paroxysmal atrial fibrillation: Secondary | ICD-10-CM | POA: Diagnosis not present

## 2022-02-02 DIAGNOSIS — C3412 Malignant neoplasm of upper lobe, left bronchus or lung: Secondary | ICD-10-CM | POA: Diagnosis not present

## 2022-02-02 DIAGNOSIS — I5022 Chronic systolic (congestive) heart failure: Secondary | ICD-10-CM

## 2022-02-02 DIAGNOSIS — I714 Abdominal aortic aneurysm, without rupture, unspecified: Secondary | ICD-10-CM | POA: Diagnosis not present

## 2022-02-02 NOTE — Assessment & Plan Note (Addendum)
Hospice is to consult; he desires no more aggressive intervention.  It has not been determined whether he will remain in the SNF or return home under their care.

## 2022-02-02 NOTE — Assessment & Plan Note (Addendum)
He describes intermittent right inferior thoracic/ right upper quadrant pain which is nonanginal.  He relates it to "liver cancer."  Hospice to consult; he would be a good candidate for sublingual morphine in the context of pain and persistent dyspnea.

## 2022-02-02 NOTE — Progress Notes (Signed)
NURSING HOME LOCATION:  [facility] ROOM NUMBER: [id number] P  CODE STATUS: DNR  PCP: Jenna Luo, MD  This is a comprehensive admission note to this SNFperformed on this date less than 30 days from date of admission. Included are preadmission medical/surgical history; reconciled medication list; family history; social history and comprehensive review of systems.  Corrections and additions to the records were documented. Comprehensive physical exam was also performed. Additionally a clinical summary was entered for each active diagnosis pertinent to this admission in the Problem List to enhance continuity of care.  HPI: He was hospitalized 8/13 - 01/31/2022 presenting with uncontrolled bowel movements manifested as mushy, unformed stool and generalized weakness to the point that he was unable to stand upright.  This is in the context of active lung cancer for which he has received radiation therapy.   CKD stage IIIa was present with a creatinine of 1.46 and GFR of 50.  Glucoses while hospitalized ranged from a low of 48 up to 142.  Current A1c indicates prediabetes with a value of 6.3%. Home insulin regimen was discontinued. Hypochromic, microcytic anemia was present with H/H of 9.7/32.6.  The patient has decided to pursue no further aggressive therapy for the lung cancer.  Palliative Care consulted; DNR status continued.  Past medical and surgical history: Includes diabetes with CKD, history of upper extremity DVT, history of atrial flutter, CAD with history MI, GERD, dyslipidemia, essential hypertension, history of osteomyelitis, sleep apnea, and active lung cancer. Surgeries & procedures include amputation of  toe, cardiac catheterization, cardioversion, coronary angioplasty with stent placement, ICD implantation, bronchoscopies, and V. tach ablation.  Social history: Presently nondrinker; former smoker with history of 50 pack years of consumption.  Family history: Reviewed    Review of systems: The poorly formed stools were described by him as "mushy"; this has resolved.  He actually states he has not had a bowel movement for 4 days.  This is in the context of his ingestion of at least 72 ounces of water each day PTA.  He describes frequency without any other genitourinary symptoms. He describes chronic shortness of breath and nonproductive cough.  He describes his oral secretions as "heavy saliva and sticky.He describes intermittent right inferior thoracic versus upper quadrant pain which he attributes to "liver cancer.".  He states that over the last 1 to 2 months he has lost 30-40 pounds ,dropping his weight from 205 down to 165.  Constitutional: No fever Eyes: No redness, discharge, pain ENT/mouth: No nasal congestion, purulent discharge, earache, change in hearing, sore throat  Cardiovascular: No chest pain, palpitations, paroxysmal nocturnal dyspnea, claudication, edema  Respiratory: No sputum production, hemoptysis, significant snoring, apnea  Gastrointestinal: No heartburn, dysphagia, nausea /vomiting, rectal bleeding, melena Genitourinary: No dysuria, hematuria, pyuria, incontinence, nocturia Musculoskeletal: No joint stiffness, joint swelling Dermatologic: No rash, pruritus, change in appearance of skin Neurologic: No dizziness, headache, syncope, seizures, numbness, tingling Endocrine: No change in hair/skin/nails, excessive thirst, excessive hunger  Hematologic/lymphatic: No significant bruising, lymphadenopathy, abnormal bleeding Allergy/immunology: No itchy/watery eyes, significant sneezing, urticaria, angioedema  Physical exam:  Pertinent or positive findings: Initially he was asleep but did not exhibit snoring or apnea.  He did arouse easily.  Pattern alopecia is present.  His beard and mustache are unkempt.  He wears a patch over the left eye.  He is hoarse.  His chest is barrel-shaped with decreased breath sounds.  First heart sound is slightly  accentuated.  Pedal pulses were surprisingly good in the context  of his history of PVD.  He has trace edema at the sock line.  Fingernails are thickened and deformed in the right hand as well as the left index fingernail.  He states the toenails are also deformed.  His socks were not removed for that exam. By history he also has toe amputation.  Interosseous wasting is noted of the hands.  He does have slight tenting.  There is irregular bruising over the dorsum of the hands.  General appearance: no acute distress, increased work of breathing is present.   Lymphatic: No lymphadenopathy about the head, neck, axilla. Eyes: No conjunctival inflammation or lid edema is present. There is no scleral icterus. Ears:  External ear exam shows no significant lesions or deformities.   Nose:  External nasal examination shows no deformity or inflammation. Nasal mucosa are pink and moist without lesions, exudates Oral exam: Lips and gums are healthy appearing.There is no oropharyngeal erythema or exudate. Neck:  No thyromegaly, masses, tenderness noted.    Heart:  Normal rate and regular rhythm without gallop, murmur, click, rub.  Lungs:  without wheezes, rhonchi, rales, rubs. Abdomen: Bowel sounds are normal.  Abdomen is soft and nontender with no organomegaly, hernias, masses. GU: Deferred  Extremities:  No cyanosis, clubbing Neurologic exam:  Balance, Rhomberg, finger to nose testing could not be completed due to clinical state Skin: Warm & dry  No significant rash.  See clinical summary under each active problem in the Problem List with associated updated therapeutic plan

## 2022-02-02 NOTE — Assessment & Plan Note (Signed)
He actually had hypoglycemia with glucoses as low as 48 while hospitalized.  Current A1c is prediabetic at 6.3%.  Diabetic medications discontinued.

## 2022-02-02 NOTE — Chronic Care Management (AMB) (Signed)
   02/02/2022  [name] 1951-04-29 [id number]    Care Management   Follow Up Note   02/02/2022 Name: John [name] MRN: [id number] DOB: 1951-01-04   Referred by: [name], MD Reason for referral : Care Coordination (DM2, CHF)   An unsuccessful telephone outreach was attempted today. The patient was referred to the case management team for assistance with care management and care coordination.  Patient recently refused further [facility] care management service with post acute care coordinator, is now admitted to SNF which states most likely will be long term and hospice is evaluating.  Follow Up Plan: No further follow up required: case closure today  Jacqlyn Larsen [facility], BSN RN Case Manager [facility] [phone]

## 2022-02-02 NOTE — Patient Instructions (Signed)
See assessment and plan under each diagnosis in the problem list and acutely for this visit 

## 2022-02-03 ENCOUNTER — Telehealth: Payer: Self-pay | Admitting: Cardiology

## 2022-02-03 DIAGNOSIS — E1169 Type 2 diabetes mellitus with other specified complication: Secondary | ICD-10-CM | POA: Diagnosis not present

## 2022-02-03 DIAGNOSIS — D630 Anemia in neoplastic disease: Secondary | ICD-10-CM | POA: Diagnosis not present

## 2022-02-03 DIAGNOSIS — C787 Secondary malignant neoplasm of liver and intrahepatic bile duct: Secondary | ICD-10-CM | POA: Diagnosis not present

## 2022-02-03 DIAGNOSIS — I110 Hypertensive heart disease with heart failure: Secondary | ICD-10-CM | POA: Diagnosis not present

## 2022-02-03 DIAGNOSIS — G4733 Obstructive sleep apnea (adult) (pediatric): Secondary | ICD-10-CM | POA: Diagnosis not present

## 2022-02-03 DIAGNOSIS — E785 Hyperlipidemia, unspecified: Secondary | ICD-10-CM | POA: Diagnosis not present

## 2022-02-03 DIAGNOSIS — E43 Unspecified severe protein-calorie malnutrition: Secondary | ICD-10-CM | POA: Diagnosis not present

## 2022-02-03 DIAGNOSIS — C3412 Malignant neoplasm of upper lobe, left bronchus or lung: Secondary | ICD-10-CM | POA: Diagnosis not present

## 2022-02-03 NOTE — Telephone Encounter (Signed)
Patient's wife states the patient has a DNR order and informed the hospice nurse today that he would like his defibrillator deactivated. She says he does not want to be shocked anymore.

## 2022-02-03 NOTE — Telephone Encounter (Signed)
Please advise on order. Once given, I will contact industry rep to deactivate therapies.

## 2022-02-06 ENCOUNTER — Encounter: Payer: Self-pay | Admitting: Adult Health

## 2022-02-06 ENCOUNTER — Ambulatory Visit: Payer: Self-pay | Admitting: Radiation Oncology

## 2022-02-06 ENCOUNTER — Other Ambulatory Visit: Payer: Self-pay | Admitting: Adult Health

## 2022-02-06 ENCOUNTER — Non-Acute Institutional Stay (SKILLED_NURSING_FACILITY): Payer: Medicare Other | Admitting: Adult Health

## 2022-02-06 DIAGNOSIS — C3412 Malignant neoplasm of upper lobe, left bronchus or lung: Secondary | ICD-10-CM | POA: Diagnosis not present

## 2022-02-06 DIAGNOSIS — E43 Unspecified severe protein-calorie malnutrition: Secondary | ICD-10-CM | POA: Diagnosis not present

## 2022-02-06 DIAGNOSIS — G4733 Obstructive sleep apnea (adult) (pediatric): Secondary | ICD-10-CM | POA: Diagnosis not present

## 2022-02-06 DIAGNOSIS — E785 Hyperlipidemia, unspecified: Secondary | ICD-10-CM | POA: Diagnosis not present

## 2022-02-06 DIAGNOSIS — C787 Secondary malignant neoplasm of liver and intrahepatic bile duct: Secondary | ICD-10-CM | POA: Diagnosis not present

## 2022-02-06 DIAGNOSIS — E1169 Type 2 diabetes mellitus with other specified complication: Secondary | ICD-10-CM | POA: Diagnosis not present

## 2022-02-06 MED ORDER — MORPHINE SULFATE (CONCENTRATE) 20 MG/ML PO SOLN: 5.0000 mg | ORAL | 0 refills | Status: AC | PRN

## 2022-02-06 NOTE — Progress Notes (Signed)
Location:  [facility] Room Number: 151 Place of Service:  SNF (31)   CODE STATUS: dnr   Allergies  Allergen Reactions   Omega-3 Fatty Acids Hives and Itching   Benazepril Other (See Comments)    hyperkalemia   Fish Allergy Itching    Chief Complaint  Patient presents with   Acute Visit    Pain management     HPI:  He continues to be followed by hospice care. He is having uncontrolled pain. He was started on oxycodone 5 mg to get him over the weekend. He states that his pain significant. He does have left upper lobe cancer with metastatic liver cancer.    Past Medical History:  Diagnosis Date   Arm DVT (deep venous thromboembolism), acute, left (HCC)    while off eliquis for bronchoscopy 2022   Atrial flutter (HCC)    s/p cardioversion   Coronary artery disease    Diabetes mellitus    GERD (gastroesophageal reflux disease)    History of nuclear stress test 04/04/2011   lexiscan; mod-large in size fixed inferolateral defect (scar); non-diagnostic for ischemia; low risk scan    Hyperlipidemia    Hypertension    Ischemic cardiomyopathy    Left foot drop    r/t past disk srugery - uses Kevlar brace   Lung cancer ([place]) 04/26/2021   Myocardial infarction [facility])    posterior MI   Osteomyelitis ([place])    s/p left 2nd toe amputation in 01/2021   Pulmonary nodule    Recurrent ventricular tachycardia (HCC)    Shortness of breath    Sleep apnea    on CPAP; 04/28/2007 split-night - AHI during total sleep 44.43/hr and REM 72.56/hr    Past Surgical History:  Procedure Laterality Date   AMPUTATION TOE Left 02/10/2021   Procedure: AMPUTATION  LEFT SECOND TOE;  Surgeon: Edrick Kins, DPM;  Location: [place];  Service: Podiatry;  Laterality: Left;   [place]   BRONCHIAL BIOPSY  04/26/2021   Procedure: BRONCHIAL BIOPSIES;  Surgeon: Garner Nash, DO;  Location: [place] [facility];  Service: Pulmonary;;   BRONCHIAL BRUSHINGS  04/26/2021   Procedure:  BRONCHIAL BRUSHINGS;  Surgeon: Garner Nash, DO;  Location: [place] [facility];  Service: Pulmonary;;   BRONCHIAL NEEDLE ASPIRATION BIOPSY  04/26/2021   Procedure: BRONCHIAL NEEDLE ASPIRATION BIOPSIES;  Surgeon: Garner Nash, DO;  Location: [place];  Service: Pulmonary;;   CARDIAC CATHETERIZATION  2010   6 stents total   CARDIAC CATHETERIZATION  01/2000   percutaneous transluminal coronary balloon angioplasty of mid RCA stenotic lesion   CARDIAC CATHETERIZATION  06/2006   no stenting; ischemic cardiomyopathy, EF 40-45%   CARDIOVERSION N/A 07/28/2016   Procedure: CARDIOVERSION;  Surgeon: Troy Sine, MD;  Location: [place];  Service: Cardiovascular;  Laterality: N/A;   CORONARY ANGIOPLASTY  09/1998   mid-distal RCA balloon dilatation, 4.5 & 5.0 stents    CORONARY ANGIOPLASTY WITH STENT PLACEMENT  03/1994   angioplasty & stenting (non-DES) of circumflex/prox ramus intermedius   CORONARY ANGIOPLASTY WITH STENT PLACEMENT  10/1994   large iliac [id number] stent to RCA   [place]  12/2002   4.9m stents x2 of RCA   CORONARY ANGIOPLASTY WITH STENT PLACEMENT  01/2005   cutting balloon arthrectomy of distal RCA & Cypher DES 3.5x13; cutting balloon arthrectomy of mid RCA with Cypher DES 3.5x18   CORONARY ANGIOPLASTY WITH STENT PLACEMENT  11/2008   stenting of  mid RCA with 4.0x49mm driver, non-DES   CORONARY BALLOON ANGIOPLASTY N/A 02/15/2021   Procedure: CORONARY BALLOON ANGIOPLASTY;  Surgeon: Troy Sine, MD;  Location: [place] [facility];  Service: Cardiovascular;  Laterality: N/A;   FIDUCIAL MARKER PLACEMENT  04/26/2021   Procedure: FIDUCIAL MARKER PLACEMENT;  Surgeon: Garner Nash, DO;  Location: [facility];  Service: Pulmonary;;   ICD IMPLANT N/A 08/20/2020   Procedure: ICD IMPLANT;  Surgeon: Constance Haw, MD;  Location: [place] [facility];  Service: Cardiovascular;  Laterality: N/A;   INTRAVASCULAR PRESSURE WIRE/FFR STUDY N/A 03/02/2020    Procedure: INTRAVASCULAR PRESSURE WIRE/FFR STUDY;  Surgeon: Leonie Man, MD;  Location: [place] [facility];  Service: Cardiovascular;  Laterality: N/A;   LEFT HEART CATH AND CORONARY ANGIOGRAPHY N/A 03/02/2020   Procedure: LEFT HEART CATH AND CORONARY ANGIOGRAPHY;  Surgeon: Leonie Man, MD;  Location: [facility];  Service: Cardiovascular;  Laterality: N/A;   LEFT HEART CATH AND CORONARY ANGIOGRAPHY N/A 08/19/2020   Procedure: LEFT HEART CATH AND CORONARY ANGIOGRAPHY;  Surgeon: Lorretta Harp, MD;  Location: [place] [facility];  Service: Cardiovascular;  Laterality: N/A;   LEFT HEART CATH AND CORONARY ANGIOGRAPHY N/A 02/15/2021   Procedure: LEFT HEART CATH AND CORONARY ANGIOGRAPHY;  Surgeon: Troy Sine, MD;  Location: [place] [facility];  Service: Cardiovascular;  Laterality: N/A;   LEFT HEART CATHETERIZATION WITH CORONARY ANGIOGRAM N/A 02/27/2012   Procedure: LEFT HEART CATHETERIZATION WITH CORONARY ANGIOGRAM;  Surgeon: Lorretta Harp, MD;  Location: [facility] LAB;  Service: Cardiovascular;  Laterality: N/A;   TRANSTHORACIC ECHOCARDIOGRAM  07/29/2010   EF 50=55%, mod inf wall hypokinesis & mild post wall hypokinesis; LA mild-mod dilated; mild mitral annular calcif & mild MR; mild TR & elevated RV systolic pressure; AV mildly sclerotic; mild aortic root dilatation    V TACH ABLATION N/A 01/20/2021   Procedure: V TACH ABLATION;  Surgeon: Vickie Epley, MD;  Location: [place] [facility];  Service: Cardiovascular;  Laterality: N/A;   VIDEO BRONCHOSCOPY WITH ENDOBRONCHIAL NAVIGATION Left 04/26/2021   Procedure: VIDEO BRONCHOSCOPY WITH ENDOBRONCHIAL NAVIGATION;  Surgeon: Garner Nash, DO;  Location: [facility];  Service: Pulmonary;  Laterality: Left;  ION w/ fiducial   VIDEO BRONCHOSCOPY WITH RADIAL ENDOBRONCHIAL ULTRASOUND  04/26/2021   Procedure: RADIAL ENDOBRONCHIAL ULTRASOUND;  Surgeon: Garner Nash, DO;  Location: [facility];  Service: Pulmonary;;    Social History    Socioeconomic History   Marital status: Married    Spouse name: [name]   Number of children: 3   Years of education: Not on file   Highest education level: Not on file  Occupational History   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 [place]  Tobacco Use   Smoking status: Former    Packs/day: 1.00    Years: 50.00    Total pack years: 50.00    Types: Cigarettes    Quit date: 07/20/2016    Years since quitting: 5.5   Smokeless tobacco: Never  Vaping Use   Vaping Use: Never used  Substance and Sexual Activity   Alcohol use: Not Currently    Alcohol/week: 0.0 standard drinks of alcohol   Drug use: No   Sexual activity: Yes  Other Topics Concern   Not on file  Social History Narrative   Married x 38 years.   Social Determinants of Health   Financial Resource Strain: Low Risk  (07/21/2021)  Overall Financial Resource Strain (CARDIA)    Difficulty of Paying Living Expenses: Not hard at all  Food Insecurity: No Food Insecurity (07/21/2021)   Hunger Vital Sign    Worried About Running Out of Food in the Last Year: Never true    Ran Out of Food in the Last Year: Never true  Transportation Needs: No Transportation Needs (07/21/2021)   PRAPARE - Hydrologist (Medical): No    Lack of Transportation (Non-Medical): No  Physical Activity: Inactive (07/21/2021)   Exercise Vital Sign    Days of Exercise per Week: 0 days    Minutes of Exercise per Session: 0 min  Stress: No Stress Concern Present (07/21/2021)   [place]    Feeling of Stress : Not at all  Social Connections: Moderately Isolated (07/21/2021)   Social Connection and Isolation Panel [NHANES]    Frequency of Communication with Friends and Family: Once a week    Frequency of Social Gatherings with Friends and Family: More than three times a week    Attends Religious Services: Never    Corporate treasurer or Organizations: No    Attends Archivist Meetings: Never    Marital Status: Married  Human resources officer Violence: Not At Risk (07/21/2021)   Humiliation, Afraid, Rape, and Kick questionnaire    Fear of Current or Ex-Partner: No    Emotionally Abused: No    Physically Abused: No    Sexually Abused: No   Family History  Problem Relation Age of Onset   Bone cancer Mother    Heart attack Father       VITAL SIGNS BP 112/71   Pulse 70   Temp 98.5 F (36.9 C)   Resp 20   Ht 6' (1.829 m)   Wt 165 lb 8 oz (75.1 kg)   SpO2 93%   BMI 22.45 kg/m   Outpatient Encounter Medications as of 02/06/2022  Medication Sig   albuterol (VENTOLIN HFA) 108 (90 Base) MCG/ACT inhaler Inhale 2 puffs into the lungs every 6 (six) hours as needed for wheezing or shortness of breath.   apixaban (ELIQUIS) 5 MG TABS tablet Take 1 tablet (5 mg total) by mouth 2 (two) times daily.   clopidogrel (PLAVIX) 75 MG tablet Take 1 tablet (75 mg total) by mouth daily.   Fluticasone Furoate (ARNUITY ELLIPTA) 200 MCG/ACT AEPB Inhale 1 puff into the lungs daily.   loperamide (IMODIUM) 2 MG capsule Take 1 capsule (2 mg total) by mouth every 6 (six) hours as needed for diarrhea or loose stools.   mexiletine (MEXITIL) 150 MG capsule Take 2 capsules (300 mg total) by mouth every 12 (twelve) hours.   montelukast (SINGULAIR) 10 MG tablet Take 1 tablet (10 mg total) by mouth daily.   morphine (ROXANOL) 20 MG/ML concentrated solution Take 0.25 mLs (5 mg total) by mouth every 2 (two) hours as needed for severe pain.   No facility-administered encounter medications on file as of 02/06/2022.     SIGNIFICANT DIAGNOSTIC EXAMS  REVIEWED PREVIOUS   11-05-21: ct of chest 1. The masslike opacity in the left upper lobe identified on today's x-ray is new compared to August 02, 2021 and obscures the site of the previously treated malignancy. The overall patchy appearance, lack of masslike findings, and presence of air  bronchograms suggests this finding is not likely to represent recurrence. Radiation changes are most likely. Pneumonia could have this appearance but  is less likely unless the patient has acute symptoms of pneumonia. Recommend attention on short-term follow-up. 2. 2 new nodular regions in the left lower lobe. These findings could represent metastatic disease, developing infiltrate, or focal atelectasis. Recommend attention on short-term follow-up. 3. New masses in the liver are very concerning for metastatic disease given history and development since February 2023. 4. Healing fractures of the medial clavicles new since February 2023. The sclerosis in multiple adjacent left anterolateral ribs are likely healing fractures as well. 5. Aneurysmal dilatation of the ascending thoracic aorta measures 4.5 cm, stable. 6. Calcified atherosclerotic changes in the thoracic aorta. Coronary artery disease, unchanged. Aortic Atherosclerosis   01-04-22: liver ultrasound:  Multiple liver lesions are seen suspicious for metastatic disease   12-22-21: chest x-ray:  1. No acute findings.  Stable left perihilar opacity from prior. 2. Left-sided pacemaker in place.  Unchanged mild cardiomegaly.   NO NEW LABS.   LABS REVIEWED  PREVIOUS    12-13-21: tsh 1.500 12-22-21: wbc 7.8; hgb 9.7; hct 32.5; mcv 82.1 plt 349; glucose 93; bun 28; creat 1.47; k+ 2.7; na++ 140; ca 8.3; gfr 50; mag 2.1; bmp 355.0 12-23-21: wbc 6.4; hgb 9.0 ;hct 31.1; mcv 83.4 plt 304; glucose 154; bun 24; creat 1.30; k+ 3.2; na++ 142; ca 8.1; gfr 58; protein 5.0 albumin 1.7; ast 72; alt 46; alk phos 507; hgb a1c 6.3 12-24-21: glucose 109; bun 21; creat 1.123; k+ 3.8; na++ 142; ca 8.1; gfr >60 phos 2.1; albumin 2.1  01-02-22: wbc 8.5; hgb 8.8; hct 29.1; mcv 81.5 plt 371; glucose 113; bun 24; creat 1.20; k+ 3.5; na++ 140; ca 8.4; gfr>60 protein 5.3; albumin 2.1 ast 51; alk phos 537  01-30-22: wbc 10.4; hgb 8.5; hct 29.8; mcv 83.9 plt 320; glucose 48; bun 33;  creat 1.25; k+ 4.3; na++ 139; ca 8.0; gfr >60; protein 4.8 albumin 1.7; alk phos 558; ast 77.   NO NEW LABS.   Review of Systems  Constitutional:  Negative for malaise/fatigue.  Respiratory:  Negative for cough and shortness of breath.   Cardiovascular:  Negative for chest pain, palpitations and leg swelling.  Gastrointestinal:  Negative for abdominal pain, constipation and heartburn.  Musculoskeletal:  Positive for joint pain and myalgias. Negative for back pain.  Skin: Negative.   Neurological:  Negative for dizziness.  Psychiatric/Behavioral:  The patient is not nervous/anxious.     Physical Exam Constitutional:      General: He is not in acute distress.    Appearance: He is well-developed. He is not diaphoretic.  Neck:     Thyroid: No thyromegaly.  Cardiovascular:     Rate and Rhythm: Normal rate and regular rhythm.     Pulses: Normal pulses.     Heart sounds: Normal heart sounds.     Comments: ICD placed  Pulmonary:     Effort: Pulmonary effort is normal. No respiratory distress.     Breath sounds: Normal breath sounds.  Chest:     Comments: Barrel chest  Abdominal:     General: Bowel sounds are normal. There is no distension.     Palpations: Abdomen is soft.     Tenderness: There is no abdominal tenderness.  Musculoskeletal:        General: Normal range of motion.     Cervical back: Neck supple.     Right lower leg: No edema.     Left lower leg: No edema.     Comments:   Status post amputation left second toe  Lymphadenopathy:     Cervical: No cervical adenopathy.  Skin:    General: Skin is warm and dry.  Neurological:     Mental Status: He is alert and oriented to person, place, and time.  Psychiatric:        Mood and Affect: Mood normal.       ASSESSMENT/ PLAN:  TODAY  Malignant neoplasm of upper lobe of left lobe Metastatic carcinoma to liver   Will stop oxycodone Will begin roxanol 5 mg every 2 hours as needed Will monitor his pain relief      Ok Edwards NP [facility]   call [phone]

## 2022-02-07 NOTE — Telephone Encounter (Signed)
The [facility] hospice nurse Bonnita Nasuti) called wanting the patient ICD therapies deactivated. I told her Dr. Curt Bears is not in the office today. I asked her to get a written order from the hospice Doctor, and then I will call [employer] to have the patient therapies deactivated.   The patient is at [facility]. The address is [place], [place] [address]. The facility phone number is [phone].

## 2022-02-08 DIAGNOSIS — G4733 Obstructive sleep apnea (adult) (pediatric): Secondary | ICD-10-CM | POA: Diagnosis not present

## 2022-02-08 DIAGNOSIS — E785 Hyperlipidemia, unspecified: Secondary | ICD-10-CM | POA: Diagnosis not present

## 2022-02-08 DIAGNOSIS — C787 Secondary malignant neoplasm of liver and intrahepatic bile duct: Secondary | ICD-10-CM | POA: Diagnosis not present

## 2022-02-08 DIAGNOSIS — E43 Unspecified severe protein-calorie malnutrition: Secondary | ICD-10-CM | POA: Diagnosis not present

## 2022-02-08 DIAGNOSIS — E1169 Type 2 diabetes mellitus with other specified complication: Secondary | ICD-10-CM | POA: Diagnosis not present

## 2022-02-08 DIAGNOSIS — C3412 Malignant neoplasm of upper lobe, left bronchus or lung: Secondary | ICD-10-CM | POA: Diagnosis not present

## 2022-02-09 ENCOUNTER — Non-Acute Institutional Stay (SKILLED_NURSING_FACILITY): Payer: Medicare Other | Admitting: Adult Health

## 2022-02-09 ENCOUNTER — Encounter: Payer: Self-pay | Admitting: Adult Health

## 2022-02-09 DIAGNOSIS — C3412 Malignant neoplasm of upper lobe, left bronchus or lung: Secondary | ICD-10-CM

## 2022-02-09 DIAGNOSIS — C787 Secondary malignant neoplasm of liver and intrahepatic bile duct: Secondary | ICD-10-CM

## 2022-02-09 NOTE — Telephone Encounter (Signed)
I have gotten a written order from the hospice doctor for the patient ICD therapies to be deactivated. I have emailed Leanna Sato and Rolla Plate to have someone go out and deactivate the patient therapies. I canceled all the patient upcoming remote appointments and taken him out of [facility].

## 2022-02-09 NOTE — Progress Notes (Signed)
Location:  [place] Room Number: 147W Place of Service:  SNF (31)   CODE STATUS: DNR  Allergies  Allergen Reactions   Omega-3 Fatty Acids Hives and Itching   Benazepril Other (See Comments)    hyperkalemia   Fish Allergy Itching    Chief Complaint  Patient presents with   Acute Visit    Status    HPI:  He is being followed by hospice care. He continues to take medications which are not providing him with comfort. I have spoken with hospice care and they are in agreement with stopping as many medications as possible. He does not appear to be in any pain. His respirations are slow and shallow. There are no indications of distress present.   Past Medical History:  Diagnosis Date   Arm DVT (deep venous thromboembolism), acute, left (HCC)    while off eliquis for bronchoscopy 2022   Atrial flutter (HCC)    s/p cardioversion   Coronary artery disease    Diabetes mellitus    GERD (gastroesophageal reflux disease)    History of nuclear stress test 04/04/2011   lexiscan; mod-large in size fixed inferolateral defect (scar); non-diagnostic for ischemia; low risk scan    Hyperlipidemia    Hypertension    Ischemic cardiomyopathy    Left foot drop    r/t past disk srugery - uses Kevlar brace   Lung cancer ([place]) 04/26/2021   Myocardial infarction [facility])    posterior MI   Osteomyelitis ([place])    s/p left 2nd toe amputation in 01/2021   Pulmonary nodule    Recurrent ventricular tachycardia (HCC)    Shortness of breath    Sleep apnea    on CPAP; 04/28/2007 split-night - AHI during total sleep 44.43/hr and REM 72.56/hr    Past Surgical History:  Procedure Laterality Date   AMPUTATION TOE Left 02/10/2021   Procedure: AMPUTATION  LEFT SECOND TOE;  Surgeon: Edrick Kins, DPM;  Location: [place] [facility];  Service: Podiatry;  Laterality: Left;   [place]   BRONCHIAL BIOPSY  04/26/2021   Procedure: BRONCHIAL BIOPSIES;  Surgeon: Garner Nash, DO;  Location:  [place] [facility];  Service: Pulmonary;;   BRONCHIAL BRUSHINGS  04/26/2021   Procedure: BRONCHIAL BRUSHINGS;  Surgeon: Garner Nash, DO;  Location: [place] [facility];  Service: Pulmonary;;   BRONCHIAL NEEDLE ASPIRATION BIOPSY  04/26/2021   Procedure: BRONCHIAL NEEDLE ASPIRATION BIOPSIES;  Surgeon: Garner Nash, DO;  Location: [place];  Service: Pulmonary;;   CARDIAC CATHETERIZATION  2010   6 stents total   CARDIAC CATHETERIZATION  01/2000   percutaneous transluminal coronary balloon angioplasty of mid RCA stenotic lesion   CARDIAC CATHETERIZATION  06/2006   no stenting; ischemic cardiomyopathy, EF 40-45%   CARDIOVERSION N/A 07/28/2016   Procedure: CARDIOVERSION;  Surgeon: Troy Sine, MD;  Location: [place];  Service: Cardiovascular;  Laterality: N/A;   CORONARY ANGIOPLASTY  09/1998   mid-distal RCA balloon dilatation, 4.5 & 5.0 stents    CORONARY ANGIOPLASTY WITH STENT PLACEMENT  03/1994   angioplasty & stenting (non-DES) of circumflex/prox ramus intermedius   CORONARY ANGIOPLASTY WITH STENT PLACEMENT  10/1994   large iliac [id number] stent to RCA   [place]  12/2002   4.59m stents x2 of RCA   CORONARY ANGIOPLASTY WITH STENT PLACEMENT  01/2005   cutting balloon arthrectomy of distal RCA & Cypher DES 3.5x13; cutting balloon arthrectomy of mid RCA with Cypher DES 3.5x18  CORONARY ANGIOPLASTY WITH STENT PLACEMENT  11/2008   stenting of mid RCA with 4.0x55m driver, non-DES   CORONARY BALLOON ANGIOPLASTY N/A 02/15/2021   Procedure: CORONARY BALLOON ANGIOPLASTY;  Surgeon: KTroy Sine MD;  [place] [facility];  Service: Cardiovascular;  Laterality: N/A;   FIDUCIAL MARKER PLACEMENT  04/26/2021   Procedure: FIDUCIAL MARKER PLACEMENT;  Surgeon: IGarner Nash DO;  [facility];  Service: Pulmonary;;   ICD IMPLANT N/A 08/20/2020   Procedure: ICD IMPLANT;  Surgeon: CConstance Haw MD;  [facility];  Service:  Cardiovascular;  Laterality: N/A;   INTRAVASCULAR PRESSURE WIRE/FFR STUDY N/A 03/02/2020   Procedure: INTRAVASCULAR PRESSURE WIRE/FFR STUDY;  Surgeon: HLeonie Man MD;  [place];  Service: Cardiovascular;  Laterality: N/A;   LEFT HEART CATH AND CORONARY ANGIOGRAPHY N/A 03/02/2020   Procedure: LEFT HEART CATH AND CORONARY ANGIOGRAPHY;  Surgeon: HLeonie Man MD;  [facility];  Service: Cardiovascular;  Laterality: N/A;   LEFT HEART CATH AND CORONARY ANGIOGRAPHY N/A 08/19/2020   Procedure: LEFT HEART CATH AND CORONARY ANGIOGRAPHY;  Surgeon: BLorretta Harp MD;  [place] [facility];  Service: Cardiovascular;  Laterality: N/A;   LEFT HEART CATH AND CORONARY ANGIOGRAPHY N/A 02/15/2021   Procedure: LEFT HEART CATH AND CORONARY ANGIOGRAPHY;  Surgeon: KTroy Sine MD;  [place];  Service: Cardiovascular;  Laterality: N/A;   LEFT HEART CATHETERIZATION WITH CORONARY ANGIOGRAM N/A 02/27/2012   Procedure: LEFT HEART CATHETERIZATION WITH CORONARY ANGIOGRAM;  Surgeon: JLorretta Harp MD;  [facility];  Service: Cardiovascular;  Laterality: N/A;   TRANSTHORACIC ECHOCARDIOGRAM  07/29/2010   EF 50=55%, mod inf wall hypokinesis & mild post wall hypokinesis; LA mild-mod dilated; mild mitral annular calcif & mild MR; mild TR & elevated RV systolic pressure; AV mildly sclerotic; mild aortic root dilatation    V TACH ABLATION N/A 01/20/2021   Procedure: V TACH ABLATION;  Surgeon: LVickie Epley MD;  [facility];  Service: Cardiovascular;  Laterality: N/A;   VIDEO BRONCHOSCOPY WITH ENDOBRONCHIAL NAVIGATION Left 04/26/2021   Procedure: VIDEO BRONCHOSCOPY WITH ENDOBRONCHIAL NAVIGATION;  Surgeon: IGarner Nash DO;  [place]  Service: Pulmonary;  Laterality: Left;  ION w/ fiducial   VIDEO BRONCHOSCOPY WITH RADIAL ENDOBRONCHIAL ULTRASOUND  04/26/2021   Procedure: RADIAL ENDOBRONCHIAL ULTRASOUND;   Surgeon: IGarner Nash DO;  [facility];  Service: Pulmonary;;    Social History   Socioeconomic History   Marital status: Married    Spouse name: BInez Catalina  Number of children: 3   Years of education: Not on file   Highest education level: Not on file  Occupational History   Occupation: m[employer] OTHER    [place] [employer]  Tobacco Use   Smoking status: Former    Packs/day: 1.00    Years: 50.00    Total pack years: 50.00    Types: Cigarettes    Quit date: 07/20/2016    Years since quitting: 5.5   Smokeless tobacco: Never  Vaping Use   Vaping Use: Never used  Substance and Sexual Activity   Alcohol use: Not Currently    Alcohol/week: 0.0 standard drinks of alcohol   Drug use: No   Sexual activity: Yes  Other Topics Concern   Not on file  Social History Narrative   Married x 38 years.   Social Determinants  of Health   Financial Resource Strain: Low Risk  (07/21/2021)   Overall Financial Resource Strain (CARDIA)    Difficulty of Paying Living Expenses: Not hard at all  Food Insecurity: No Food Insecurity (07/21/2021)   Hunger Vital Sign    Worried About Running Out of Food in the Last Year: Never true    Ran Out of Food in the Last Year: Never true  Transportation Needs: No Transportation Needs (07/21/2021)   PRAPARE - Hydrologist (Medical): No    Lack of Transportation (Non-Medical): No  Physical Activity: Inactive (07/21/2021)   Exercise Vital Sign    Days of Exercise per Week: 0 days    Minutes of Exercise per Session: 0 min  Stress: No Stress Concern Present (07/21/2021)   [place]    Feeling of Stress : Not at all  Social Connections: Moderately Isolated (07/21/2021)   Social Connection and Isolation Panel [NHANES]    Frequency of Communication with Friends and Family: Once a week    Frequency of Social Gatherings with Friends and  Family: More than three times a week    Attends Religious Services: Never    Marine scientist or Organizations: No    Attends Archivist Meetings: Never    Marital Status: Married  Human resources officer Violence: Not At Risk (07/21/2021)   Humiliation, Afraid, Rape, and Kick questionnaire    Fear of Current or Ex-Partner: No    Emotionally Abused: No    Physically Abused: No    Sexually Abused: No   Family History  Problem Relation Age of Onset   Bone cancer Mother    Heart attack Father       VITAL SIGNS BP 111/60   Pulse 88   Temp (!) 97.2 F (36.2 C) (Skin)   Resp 20   Ht 6' (1.829 m)   Wt 161 lb 1.6 oz (73.1 kg)   SpO2 96%   BMI 21.85 kg/m   Outpatient Encounter Medications as of 02/09/2022  Medication Sig   albuterol (VENTOLIN HFA) 108 (90 Base) MCG/ACT inhaler Inhale 2 puffs into the lungs every 6 (six) hours as needed for wheezing or shortness of breath.   Balsam [place]-Castor Oil (VENELEX EX) Apply topical to sacrum, coccyx and bilateral buttocks every shift for prevention.   Fluticasone Furoate (ARNUITY ELLIPTA) 200 MCG/ACT AEPB Inhale 1 puff into the lungs daily.   guaiFENesin (ROBITUSSIN) 100 MG/5ML liquid Take 5 mLs by mouth every 6 (six) hours as needed for cough or to loosen phlegm.   loperamide (IMODIUM) 2 MG capsule Take 1 capsule (2 mg total) by mouth every 6 (six) hours as needed for diarrhea or loose stools.   morphine (ROXANOL) 20 MG/ML concentrated solution Take 0.25 mLs (5 mg total) by mouth every 2 (two) hours as needed for severe pain.   ondansetron (ZOFRAN-ODT) 8 MG disintegrating tablet Take 8 mg by mouth every 6 (six) hours as needed for nausea or vomiting.   [DISCONTINUED] apixaban (ELIQUIS) 5 MG TABS tablet Take 1 tablet (5 mg total) by mouth 2 (two) times daily.   [DISCONTINUED] clopidogrel (PLAVIX) 75 MG tablet Take 1 tablet (75 mg total) by mouth daily.   [DISCONTINUED] mexiletine (MEXITIL) 150 MG capsule Take 2 capsules (300 mg total)  by mouth every 12 (twelve) hours.   [DISCONTINUED] montelukast (SINGULAIR) 10 MG tablet Take 1 tablet (10 mg total) by mouth daily.   No facility-administered  encounter medications on file as of 02/09/2022.     SIGNIFICANT DIAGNOSTIC EXAMS  REVIEWED PREVIOUS   11-05-21: ct of chest 1. The masslike opacity in the left upper lobe identified on today's x-ray is new compared to August 02, 2021 and obscures the site of the previously treated malignancy. The overall patchy appearance, lack of masslike findings, and presence of air bronchograms suggests this finding is not likely to represent recurrence. Radiation changes are most likely. Pneumonia could have this appearance but is less likely unless the patient has acute symptoms of pneumonia. Recommend attention on short-term follow-up. 2. 2 new nodular regions in the left lower lobe. These findings could represent metastatic disease, developing infiltrate, or focal atelectasis. Recommend attention on short-term follow-up. 3. New masses in the liver are very concerning for metastatic disease given history and development since February 2023. 4. Healing fractures of the medial clavicles new since February 2023. The sclerosis in multiple adjacent left anterolateral ribs are likely healing fractures as well. 5. Aneurysmal dilatation of the ascending thoracic aorta measures 4.5 cm, stable. 6. Calcified atherosclerotic changes in the thoracic aorta. Coronary artery disease, unchanged. Aortic Atherosclerosis   01-04-22: liver ultrasound:  Multiple liver lesions are seen suspicious for metastatic disease   12-22-21: chest x-ray:  1. No acute findings.  Stable left perihilar opacity from prior. 2. Left-sided pacemaker in place.  Unchanged mild cardiomegaly.   NO NEW LABS.   LABS REVIEWED  PREVIOUS    12-13-21: tsh 1.500 12-22-21: wbc 7.8; hgb 9.7; hct 32.5; mcv 82.1 plt 349; glucose 93; bun 28; creat 1.47; k+ 2.7; na++ 140; ca 8.3; gfr 50; mag 2.1; bmp  355.0 12-23-21: wbc 6.4; hgb 9.0 ;hct 31.1; mcv 83.4 plt 304; glucose 154; bun 24; creat 1.30; k+ 3.2; na++ 142; ca 8.1; gfr 58; protein 5.0 albumin 1.7; ast 72; alt 46; alk phos 507; hgb a1c 6.3 12-24-21: glucose 109; bun 21; creat 1.123; k+ 3.8; na++ 142; ca 8.1; gfr >60 phos 2.1; albumin 2.1  01-02-22: wbc 8.5; hgb 8.8; hct 29.1; mcv 81.5 plt 371; glucose 113; bun 24; creat 1.20; k+ 3.5; na++ 140; ca 8.4; gfr>60 protein 5.3; albumin 2.1 ast 51; alk phos 537  01-30-22: wbc 10.4; hgb 8.5; hct 29.8; mcv 83.9 plt 320; glucose 48; bun 33; creat 1.25; k+ 4.3; na++ 139; ca 8.0; gfr >60; protein 4.8 albumin 1.7; alk phos 558; ast 77.   NO NEW LABS.   Review of Systems  Reason unable to perform ROS: unable to participate at this time.    Physical Exam Constitutional:      General: He is not in acute distress.    Appearance: He is underweight. He is not diaphoretic.  Neck:     Thyroid: No thyromegaly.  Cardiovascular:     Rate and Rhythm: Normal rate and regular rhythm.     Pulses: Normal pulses.     Heart sounds: Normal heart sounds.     Comments: ICD placed  Pulmonary:     Effort: Pulmonary effort is normal. No respiratory distress.     Breath sounds: Normal breath sounds.     Comments: Barrel chest  Abdominal:     General: Bowel sounds are normal. There is no distension.     Palpations: Abdomen is soft.     Tenderness: There is no abdominal tenderness.  Musculoskeletal:     Cervical back: Neck supple.     Right lower leg: No edema.     Left lower leg: No edema.  Comments:  Status post amputation left second toe   Lymphadenopathy:     Cervical: No cervical adenopathy.  Skin:    General: Skin is warm and dry.  Neurological:     Comments: Is aware   Psychiatric:        Mood and Affect: Mood normal.       ASSESSMENT/ PLAN:  TODAY  Malignant neoplasm of upper lobe of left lung  Metastatic carcinoma to liver  Will stop the following medications: eliquis; prostat; plavix;  mexitine; routine tylenol; singulair Will continue to focus his care on comfort    Ok Edwards NP [facility]  call [phone]

## 2022-02-10 DIAGNOSIS — C787 Secondary malignant neoplasm of liver and intrahepatic bile duct: Secondary | ICD-10-CM | POA: Diagnosis not present

## 2022-02-10 DIAGNOSIS — E785 Hyperlipidemia, unspecified: Secondary | ICD-10-CM | POA: Diagnosis not present

## 2022-02-10 DIAGNOSIS — E1169 Type 2 diabetes mellitus with other specified complication: Secondary | ICD-10-CM | POA: Diagnosis not present

## 2022-02-10 DIAGNOSIS — G4733 Obstructive sleep apnea (adult) (pediatric): Secondary | ICD-10-CM | POA: Diagnosis not present

## 2022-02-10 DIAGNOSIS — E43 Unspecified severe protein-calorie malnutrition: Secondary | ICD-10-CM | POA: Diagnosis not present

## 2022-02-10 DIAGNOSIS — C3412 Malignant neoplasm of upper lobe, left bronchus or lung: Secondary | ICD-10-CM | POA: Diagnosis not present

## 2022-02-13 ENCOUNTER — Other Ambulatory Visit: Payer: Self-pay | Admitting: Family Medicine

## 2022-02-13 DIAGNOSIS — E43 Unspecified severe protein-calorie malnutrition: Secondary | ICD-10-CM | POA: Diagnosis not present

## 2022-02-13 DIAGNOSIS — E785 Hyperlipidemia, unspecified: Secondary | ICD-10-CM | POA: Diagnosis not present

## 2022-02-13 DIAGNOSIS — E1169 Type 2 diabetes mellitus with other specified complication: Secondary | ICD-10-CM | POA: Diagnosis not present

## 2022-02-13 DIAGNOSIS — C3412 Malignant neoplasm of upper lobe, left bronchus or lung: Secondary | ICD-10-CM | POA: Diagnosis not present

## 2022-02-13 DIAGNOSIS — C787 Secondary malignant neoplasm of liver and intrahepatic bile duct: Secondary | ICD-10-CM | POA: Diagnosis not present

## 2022-02-13 DIAGNOSIS — G4733 Obstructive sleep apnea (adult) (pediatric): Secondary | ICD-10-CM | POA: Diagnosis not present

## 2022-02-13 NOTE — Telephone Encounter (Signed)
Requested medications are due for refill today.  Unsure  Requested medications are on the active medications list.  no  Last refill. 12/02/2021 #30 0 refills  Future visit scheduled.   yes  Notes to clinic.  Ok Edwards is listed as PCP. Medication was discontinued  01/13/2022    Requested Prescriptions  Pending Prescriptions Disp Refills   JARDIANCE 25 MG TABS tablet [Pharmacy Med Name: JARDIANCE 25 MG TABLET] 30 tablet 0    Sig: TAKE 1 TABLET BY MOUTH EVERY DAY     Endocrinology:  Diabetes - SGLT2 Inhibitors Failed - 02/13/2022  2:08 AM      Failed - Cr in normal range and within 360 days    Creat  Date Value Ref Range Status  09/15/2019 0.96 0.70 - 1.18 mg/dL Final    Comment:    For patients >22 years of age, the reference limit for Creatinine is approximately 13% higher for people identified as African-American. .    Creatinine, Ser  Date Value Ref Range Status  01/30/2022 1.25 (H) 0.61 - 1.24 mg/dL Final         Passed - HBA1C is between 0 and 7.9 and within 180 days    Hgb A1c MFr Bld  Date Value Ref Range Status  12/23/2021 6.3 (H) 4.8 - 5.6 % Final    Comment:    (NOTE) Pre diabetes:          5.7%-6.4%  Diabetes:              >6.4%  Glycemic control for   <7.0% adults with diabetes          Passed - eGFR in normal range and within 360 days    GFR, Est African American  Date Value Ref Range Status  09/15/2019 92 > OR = 60 mL/min/1.43m Final   GFR calc Af Amer  Date Value Ref Range Status  02/28/2020 >60 >60 mL/min Final   GFR, Est Non African American  Date Value Ref Range Status  09/15/2019 80 > OR = 60 mL/min/1.743mFinal   GFR, Estimated  Date Value Ref Range Status  01/30/2022 >60 >60 mL/min Final    Comment:    (NOTE) Calculated using the CKD-EPI Creatinine Equation (2021)    eGFR  Date Value Ref Range Status  12/21/2021 55 (L) >59 mL/min/1.73 Final         Passed - Valid encounter within last 6 months    Recent Outpatient  Visits           3 months ago DM type 2, goal HbA1c < 7% ([facility]  [place]Susy FrizzleMD   9 months ago Arm DVT (deep venous thromboembolism), acute, left ([facility]  [place]Susy FrizzleMD   9 months ago DVT of left axillary vein, acute ([facility]  Br[place]houseaEulogio BearNP   11 months ago ASCVD (arteriosclerotic cardiovascular disease)   [place]ammie McgeeMD   1 year ago Right arm pain   [place], Wa[place]ammie McgeeMD

## 2022-02-14 DIAGNOSIS — C787 Secondary malignant neoplasm of liver and intrahepatic bile duct: Secondary | ICD-10-CM | POA: Diagnosis not present

## 2022-02-14 DIAGNOSIS — E43 Unspecified severe protein-calorie malnutrition: Secondary | ICD-10-CM | POA: Diagnosis not present

## 2022-02-14 DIAGNOSIS — E785 Hyperlipidemia, unspecified: Secondary | ICD-10-CM | POA: Diagnosis not present

## 2022-02-14 DIAGNOSIS — E1169 Type 2 diabetes mellitus with other specified complication: Secondary | ICD-10-CM | POA: Diagnosis not present

## 2022-02-14 DIAGNOSIS — C3412 Malignant neoplasm of upper lobe, left bronchus or lung: Secondary | ICD-10-CM | POA: Diagnosis not present

## 2022-02-14 DIAGNOSIS — G4733 Obstructive sleep apnea (adult) (pediatric): Secondary | ICD-10-CM | POA: Diagnosis not present

## 2022-02-17 ENCOUNTER — Non-Acute Institutional Stay (SKILLED_NURSING_FACILITY): Payer: Medicare Other | Admitting: Adult Health

## 2022-02-17 ENCOUNTER — Encounter: Payer: Self-pay | Admitting: Adult Health

## 2022-02-17 DIAGNOSIS — C3412 Malignant neoplasm of upper lobe, left bronchus or lung: Secondary | ICD-10-CM

## 2022-02-17 DIAGNOSIS — I2510 Atherosclerotic heart disease of native coronary artery without angina pectoris: Secondary | ICD-10-CM | POA: Diagnosis not present

## 2022-02-17 DIAGNOSIS — E785 Hyperlipidemia, unspecified: Secondary | ICD-10-CM | POA: Diagnosis not present

## 2022-02-17 DIAGNOSIS — I639 Cerebral infarction, unspecified: Secondary | ICD-10-CM | POA: Diagnosis not present

## 2022-02-17 DIAGNOSIS — I700 Atherosclerosis of aorta: Secondary | ICD-10-CM | POA: Diagnosis not present

## 2022-02-17 DIAGNOSIS — E1169 Type 2 diabetes mellitus with other specified complication: Secondary | ICD-10-CM | POA: Diagnosis not present

## 2022-02-17 DIAGNOSIS — C787 Secondary malignant neoplasm of liver and intrahepatic bile duct: Secondary | ICD-10-CM

## 2022-02-17 DIAGNOSIS — I5042 Chronic combined systolic (congestive) and diastolic (congestive) heart failure: Secondary | ICD-10-CM | POA: Diagnosis not present

## 2022-02-17 DIAGNOSIS — G4733 Obstructive sleep apnea (adult) (pediatric): Secondary | ICD-10-CM | POA: Diagnosis not present

## 2022-02-17 DIAGNOSIS — I480 Paroxysmal atrial fibrillation: Secondary | ICD-10-CM | POA: Diagnosis not present

## 2022-02-17 DIAGNOSIS — I10 Essential (primary) hypertension: Secondary | ICD-10-CM | POA: Diagnosis not present

## 2022-02-17 DIAGNOSIS — I714 Abdominal aortic aneurysm, without rupture, unspecified: Secondary | ICD-10-CM | POA: Diagnosis not present

## 2022-02-17 DIAGNOSIS — E43 Unspecified severe protein-calorie malnutrition: Secondary | ICD-10-CM | POA: Diagnosis not present

## 2022-02-17 NOTE — Progress Notes (Unsigned)
Location:  [place] Room Number: 151-P Place of Service:  SNF (31)   CODE STATUS: DNR  Allergies  Allergen Reactions   Omega-3 Fatty Acids Hives and Itching   Benazepril Other (See Comments)    hyperkalemia   Fish Allergy Itching    Chief Complaint  Patient presents with   Discharge Note    HPI:  He is being transferred to the hospice house to spend the end of his life. He will not need any dme; no home health no dme. His medications will be provided by hospice care and hospice will follow him medically. He has left lung cancer with liver mets.   Past Medical History:  Diagnosis Date   Arm DVT (deep venous thromboembolism), acute, left (HCC)    while off eliquis for bronchoscopy 2022   Atrial flutter (HCC)    s/p cardioversion   Coronary artery disease    Diabetes mellitus    GERD (gastroesophageal reflux disease)    History of nuclear stress test 04/04/2011   lexiscan; mod-large in size fixed inferolateral defect (scar); non-diagnostic for ischemia; low risk scan    Hyperlipidemia    Hypertension    Ischemic cardiomyopathy    Left foot drop    r/t past disk srugery - uses Kevlar brace   Lung cancer ([place]) 04/26/2021   Myocardial infarction [facility])    posterior MI   Osteomyelitis ([place])    s/p left 2nd toe amputation in 01/2021   Pulmonary nodule    Recurrent ventricular tachycardia (HCC)    Shortness of breath    Sleep apnea    on CPAP; 04/28/2007 split-night - AHI during total sleep 44.43/hr and REM 72.56/hr    Past Surgical History:  Procedure Laterality Date   AMPUTATION TOE Left 02/10/2021   Procedure: AMPUTATION  LEFT SECOND TOE;  Surgeon: Edrick Kins, DPM;  Location: [place];  Service: Podiatry;  Laterality: Left;   [place]   BRONCHIAL BIOPSY  04/26/2021   Procedure: BRONCHIAL BIOPSIES;  Surgeon: Garner Nash, DO;  Location: [place] [facility];  Service: Pulmonary;;   BRONCHIAL BRUSHINGS  04/26/2021   Procedure: BRONCHIAL  BRUSHINGS;  Surgeon: Garner Nash, DO;  Location: [place] [facility];  Service: Pulmonary;;   BRONCHIAL NEEDLE ASPIRATION BIOPSY  04/26/2021   Procedure: BRONCHIAL NEEDLE ASPIRATION BIOPSIES;  Surgeon: Garner Nash, DO;  Location: [place];  Service: Pulmonary;;   CARDIAC CATHETERIZATION  2010   6 stents total   CARDIAC CATHETERIZATION  01/2000   percutaneous transluminal coronary balloon angioplasty of mid RCA stenotic lesion   CARDIAC CATHETERIZATION  06/2006   no stenting; ischemic cardiomyopathy, EF 40-45%   CARDIOVERSION N/A 07/28/2016   Procedure: CARDIOVERSION;  Surgeon: Troy Sine, MD;  Location: [place];  Service: Cardiovascular;  Laterality: N/A;   CORONARY ANGIOPLASTY  09/1998   mid-distal RCA balloon dilatation, 4.5 & 5.0 stents    CORONARY ANGIOPLASTY WITH STENT PLACEMENT  03/1994   angioplasty & stenting (non-DES) of circumflex/prox ramus intermedius   CORONARY ANGIOPLASTY WITH STENT PLACEMENT  10/1994   large iliac [id number] stent to RCA   [place]  12/2002   4.91m stents x2 of RCA   CORONARY ANGIOPLASTY WITH STENT PLACEMENT  01/2005   cutting balloon arthrectomy of distal RCA & Cypher DES 3.5x13; cutting balloon arthrectomy of mid RCA with Cypher DES 3.5x18   CORONARY ANGIOPLASTY WITH STENT PLACEMENT  11/2008   stenting of mid RCA with 4.0x119m  driver, non-DES   CORONARY BALLOON ANGIOPLASTY N/A 02/15/2021   Procedure: CORONARY BALLOON ANGIOPLASTY;  Surgeon: Troy Sine, MD;  Location: [place] CV [facility];  Service: Cardiovascular;  Laterality: N/A;   FIDUCIAL MARKER PLACEMENT  04/26/2021   Procedure: FIDUCIAL MARKER PLACEMENT;  Surgeon: Garner Nash, DO;  Location: [place] [facility];  Service: Pulmonary;;   ICD IMPLANT N/A 08/20/2020   Procedure: ICD IMPLANT;  Surgeon: Constance Haw, MD;  Location: [place] [facility];  Service: Cardiovascular;  Laterality: N/A;   INTRAVASCULAR PRESSURE WIRE/FFR STUDY N/A 03/02/2020   Procedure:  INTRAVASCULAR PRESSURE WIRE/FFR STUDY;  Surgeon: Leonie Man, MD;  Location: [place] [facility];  Service: Cardiovascular;  Laterality: N/A;   LEFT HEART CATH AND CORONARY ANGIOGRAPHY N/A 03/02/2020   Procedure: LEFT HEART CATH AND CORONARY ANGIOGRAPHY;  Surgeon: Leonie Man, MD;  Location: [place] [facility];  Service: Cardiovascular;  Laterality: N/A;   LEFT HEART CATH AND CORONARY ANGIOGRAPHY N/A 08/19/2020   Procedure: LEFT HEART CATH AND CORONARY ANGIOGRAPHY;  Surgeon: Lorretta Harp, MD;  Location: [place] [facility];  Service: Cardiovascular;  Laterality: N/A;   LEFT HEART CATH AND CORONARY ANGIOGRAPHY N/A 02/15/2021   Procedure: LEFT HEART CATH AND CORONARY ANGIOGRAPHY;  Surgeon: Troy Sine, MD;  Location: [place] [facility];  Service: Cardiovascular;  Laterality: N/A;   LEFT HEART CATHETERIZATION WITH CORONARY ANGIOGRAM N/A 02/27/2012   Procedure: LEFT HEART CATHETERIZATION WITH CORONARY ANGIOGRAM;  Surgeon: Lorretta Harp, MD;  Location: [facility];  Service: Cardiovascular;  Laterality: N/A;   TRANSTHORACIC ECHOCARDIOGRAM  07/29/2010   EF 50=55%, mod inf wall hypokinesis & mild post wall hypokinesis; LA mild-mod dilated; mild mitral annular calcif & mild MR; mild TR & elevated RV systolic pressure; AV mildly sclerotic; mild aortic root dilatation    V TACH ABLATION N/A 01/20/2021   Procedure: V TACH ABLATION;  Surgeon: Vickie Epley, MD;  Location: [place] [facility];  Service: Cardiovascular;  Laterality: N/A;   VIDEO BRONCHOSCOPY WITH ENDOBRONCHIAL NAVIGATION Left 04/26/2021   Procedure: VIDEO BRONCHOSCOPY WITH ENDOBRONCHIAL NAVIGATION;  Surgeon: Garner Nash, DO;  Location: [place];  Service: Pulmonary;  Laterality: Left;  [facility] w/ fiducial   VIDEO BRONCHOSCOPY WITH RADIAL ENDOBRONCHIAL ULTRASOUND  04/26/2021   Procedure: RADIAL ENDOBRONCHIAL ULTRASOUND;  Surgeon: Garner Nash, DO;  Location: [facility];  Service: Pulmonary;;    Social History    Socioeconomic History   Marital status: Married    Spouse name: [name]   Number of children: 3   Years of education: Not on file   Highest education level: Not on file  Occupational History   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 [place]  Tobacco Use   Smoking status: Former    Packs/day: 1.00    Years: 50.00    Total pack years: 50.00    Types: Cigarettes    Quit date: 07/20/2016    Years since quitting: 5.5   Smokeless tobacco: Never  Vaping Use   Vaping Use: Never used  Substance and Sexual Activity   Alcohol use: Not Currently    Alcohol/week: 0.0 standard drinks of alcohol   Drug use: No   Sexual activity: Yes  Other Topics Concern   Not on file  Social History Narrative   Married x 38 years.   Social Determinants of Health   Financial Resource Strain: Low Risk  (07/21/2021)   Overall Financial  Resource Strain (CARDIA)    Difficulty of Paying Living Expenses: Not hard at all  Food Insecurity: No Food Insecurity (07/21/2021)   Hunger Vital Sign    Worried About Running Out of Food in the Last Year: Never true    Ran Out of Food in the Last Year: Never true  Transportation Needs: No Transportation Needs (07/21/2021)   PRAPARE - Hydrologist (Medical): No    Lack of Transportation (Non-Medical): No  Physical Activity: Inactive (07/21/2021)   Exercise Vital Sign    Days of Exercise per Week: 0 days    Minutes of Exercise per Session: 0 min  Stress: No Stress Concern Present (07/21/2021)   [place]    Feeling of Stress : Not at all  Social Connections: Moderately Isolated (07/21/2021)   Social Connection and Isolation Panel [NHANES]    Frequency of Communication with Friends and Family: Once a week    Frequency of Social Gatherings with Friends and Family: More than three times a week    Attends Religious Services: Never    Corporate treasurer or Organizations: No    Attends Archivist Meetings: Never    Marital Status: Married  Human resources officer Violence: Not At Risk (07/21/2021)   Humiliation, Afraid, Rape, and Kick questionnaire    Fear of Current or Ex-Partner: No    Emotionally Abused: No    Physically Abused: No    Sexually Abused: No   Family History  Problem Relation Age of Onset   Bone cancer Mother    Heart attack Father       VITAL SIGNS BP 97/60   Pulse 70   Temp 97.8 F (36.6 C)   Resp 18   Ht 6' (1.829 m)   Wt 162 lb 3.2 oz (73.6 kg)   SpO2 98%   BMI 22.00 kg/m   Outpatient Encounter Medications as of 02/17/2022  Medication Sig   albuterol (VENTOLIN HFA) 108 (90 Base) MCG/ACT inhaler Inhale 2 puffs into the lungs every 6 (six) hours as needed for wheezing or shortness of breath.   Fluticasone Furoate (ARNUITY ELLIPTA) 200 MCG/ACT AEPB Inhale 1 puff into the lungs daily.   guaiFENesin (ROBITUSSIN) 100 MG/5ML liquid Take 5 mLs by mouth every 6 (six) hours as needed for cough or to loosen phlegm.   loperamide (IMODIUM) 2 MG capsule Take 1 capsule (2 mg total) by mouth every 6 (six) hours as needed for diarrhea or loose stools.   melatonin 5 MG TABS Take 5 mg by mouth at bedtime.   morphine (ROXANOL) 20 MG/ML concentrated solution Take 0.25 mLs (5 mg total) by mouth every 2 (two) hours as needed for severe pain.   ondansetron (ZOFRAN-ODT) 8 MG disintegrating tablet Take 8 mg by mouth every 6 (six) hours as needed for nausea or vomiting.   [DISCONTINUED] Balsam [place]-Castor Oil (VENELEX EX) Apply topical to sacrum, coccyx and bilateral buttocks every shift for prevention.   No facility-administered encounter medications on file as of 02/17/2022.     SIGNIFICANT DIAGNOSTIC EXAMS  REVIEWED PREVIOUS   11-05-21: ct of chest 1. The masslike opacity in the left upper lobe identified on today's x-ray is new compared to August 02, 2021 and obscures the site of the previously treated malignancy.  The overall patchy appearance, lack of masslike findings, and presence of air bronchograms suggests this finding is not likely to represent recurrence. Radiation changes are most  likely. Pneumonia could have this appearance but is less likely unless the patient has acute symptoms of pneumonia. Recommend attention on short-term follow-up. 2. 2 new nodular regions in the left lower lobe. These findings could represent metastatic disease, developing infiltrate, or focal atelectasis. Recommend attention on short-term follow-up. 3. New masses in the liver are very concerning for metastatic disease given history and development since February 2023. 4. Healing fractures of the medial clavicles new since February 2023. The sclerosis in multiple adjacent left anterolateral ribs are likely healing fractures as well. 5. Aneurysmal dilatation of the ascending thoracic aorta measures 4.5 cm, stable. 6. Calcified atherosclerotic changes in the thoracic aorta. Coronary artery disease, unchanged. Aortic Atherosclerosis   01-04-22: liver ultrasound:  Multiple liver lesions are seen suspicious for metastatic disease   12-22-21: chest x-ray:  1. No acute findings.  Stable left perihilar opacity from prior. 2. Left-sided pacemaker in place.  Unchanged mild cardiomegaly.   NO NEW LABS.   LABS REVIEWED  PREVIOUS    12-13-21: tsh 1.500 12-22-21: wbc 7.8; hgb 9.7; hct 32.5; mcv 82.1 plt 349; glucose 93; bun 28; creat 1.47; k+ 2.7; na++ 140; ca 8.3; gfr 50; mag 2.1; bmp 355.0 12-23-21: wbc 6.4; hgb 9.0 ;hct 31.1; mcv 83.4 plt 304; glucose 154; bun 24; creat 1.30; k+ 3.2; na++ 142; ca 8.1; gfr 58; protein 5.0 albumin 1.7; ast 72; alt 46; alk phos 507; hgb a1c 6.3 12-24-21: glucose 109; bun 21; creat 1.123; k+ 3.8; na++ 142; ca 8.1; gfr >60 phos 2.1; albumin 2.1  01-02-22: wbc 8.5; hgb 8.8; hct 29.1; mcv 81.5 plt 371; glucose 113; bun 24; creat 1.20; k+ 3.5; na++ 140; ca 8.4; gfr>60 protein 5.3; albumin 2.1 ast 51; alk phos 537   01-30-22: wbc 10.4; hgb 8.5; hct 29.8; mcv 83.9 plt 320; glucose 48; bun 33; creat 1.25; k+ 4.3; na++ 139; ca 8.0; gfr >60; protein 4.8 albumin 1.7; alk phos 558; ast 77.   NO NEW LABS.   Review of Systems  Reason unable to perform ROS: unable to fully participate.    Physical Exam Constitutional:      General: He is not in acute distress.    Appearance: He is well-developed. He is not diaphoretic.  Neck:     Thyroid: No thyromegaly.  Cardiovascular:     Rate and Rhythm: Normal rate and regular rhythm.     Pulses: Normal pulses.     Heart sounds: Normal heart sounds.     Comments: ICD  Pulmonary:     Effort: Pulmonary effort is normal. No respiratory distress.     Breath sounds: Normal breath sounds.  Abdominal:     General: Bowel sounds are normal. There is no distension.     Palpations: Abdomen is soft.     Tenderness: There is no abdominal tenderness.  Musculoskeletal:        General: Normal range of motion.     Cervical back: Neck supple.  Lymphadenopathy:     Cervical: No cervical adenopathy.  Skin:    General: Skin is warm and dry.  Neurological:     Mental Status: He is alert. Mental status is at baseline.  Psychiatric:        Mood and Affect: Mood normal.       ASSESSMENT/ PLAN:   Patient is being discharged with the following home health services:  none   Patient is being discharged with the following durable medical equipment:  none   Patient has been advised to  f/u with their PCP in 1-2 weeks to for a transitions of care visit.  Social services at their facility was responsible for arranging this appointment.  Pt was provided with adequate prescriptions of noncontrolled medications to reach the scheduled appointment .  For controlled substances, a limited supply was provided as appropriate for the individual patient.  If the pt normally receives these medications from a pain clinic or has a contract with another physician, these medications should be  received from that clinic or physician only).  He is being followed at the hospice house.    Ok Edwards NP [facility]   call [phone]

## 2022-02-18 DIAGNOSIS — C787 Secondary malignant neoplasm of liver and intrahepatic bile duct: Secondary | ICD-10-CM | POA: Diagnosis not present

## 2022-02-18 DIAGNOSIS — E1169 Type 2 diabetes mellitus with other specified complication: Secondary | ICD-10-CM | POA: Diagnosis not present

## 2022-02-18 DIAGNOSIS — E785 Hyperlipidemia, unspecified: Secondary | ICD-10-CM | POA: Diagnosis not present

## 2022-02-18 DIAGNOSIS — E43 Unspecified severe protein-calorie malnutrition: Secondary | ICD-10-CM | POA: Diagnosis not present

## 2022-02-18 DIAGNOSIS — G4733 Obstructive sleep apnea (adult) (pediatric): Secondary | ICD-10-CM | POA: Diagnosis not present

## 2022-02-18 DIAGNOSIS — C3412 Malignant neoplasm of upper lobe, left bronchus or lung: Secondary | ICD-10-CM | POA: Diagnosis not present

## 2022-02-19 DIAGNOSIS — E43 Unspecified severe protein-calorie malnutrition: Secondary | ICD-10-CM | POA: Diagnosis not present

## 2022-02-19 DIAGNOSIS — G4733 Obstructive sleep apnea (adult) (pediatric): Secondary | ICD-10-CM | POA: Diagnosis not present

## 2022-02-19 DIAGNOSIS — C3412 Malignant neoplasm of upper lobe, left bronchus or lung: Secondary | ICD-10-CM | POA: Diagnosis not present

## 2022-02-19 DIAGNOSIS — C787 Secondary malignant neoplasm of liver and intrahepatic bile duct: Secondary | ICD-10-CM | POA: Diagnosis not present

## 2022-02-19 DIAGNOSIS — E1169 Type 2 diabetes mellitus with other specified complication: Secondary | ICD-10-CM | POA: Diagnosis not present

## 2022-02-19 DIAGNOSIS — E785 Hyperlipidemia, unspecified: Secondary | ICD-10-CM | POA: Diagnosis not present

## 2022-02-20 DIAGNOSIS — C787 Secondary malignant neoplasm of liver and intrahepatic bile duct: Secondary | ICD-10-CM | POA: Diagnosis not present

## 2022-02-20 DIAGNOSIS — C3412 Malignant neoplasm of upper lobe, left bronchus or lung: Secondary | ICD-10-CM | POA: Diagnosis not present

## 2022-02-20 DIAGNOSIS — E43 Unspecified severe protein-calorie malnutrition: Secondary | ICD-10-CM | POA: Diagnosis not present

## 2022-02-20 DIAGNOSIS — G4733 Obstructive sleep apnea (adult) (pediatric): Secondary | ICD-10-CM | POA: Diagnosis not present

## 2022-02-20 DIAGNOSIS — E1169 Type 2 diabetes mellitus with other specified complication: Secondary | ICD-10-CM | POA: Diagnosis not present

## 2022-02-20 DIAGNOSIS — E785 Hyperlipidemia, unspecified: Secondary | ICD-10-CM | POA: Diagnosis not present

## 2022-02-21 DIAGNOSIS — C787 Secondary malignant neoplasm of liver and intrahepatic bile duct: Secondary | ICD-10-CM | POA: Diagnosis not present

## 2022-02-21 DIAGNOSIS — E1169 Type 2 diabetes mellitus with other specified complication: Secondary | ICD-10-CM | POA: Diagnosis not present

## 2022-02-21 DIAGNOSIS — E785 Hyperlipidemia, unspecified: Secondary | ICD-10-CM | POA: Diagnosis not present

## 2022-02-21 DIAGNOSIS — C3412 Malignant neoplasm of upper lobe, left bronchus or lung: Secondary | ICD-10-CM | POA: Diagnosis not present

## 2022-02-21 DIAGNOSIS — E43 Unspecified severe protein-calorie malnutrition: Secondary | ICD-10-CM | POA: Diagnosis not present

## 2022-02-21 DIAGNOSIS — G4733 Obstructive sleep apnea (adult) (pediatric): Secondary | ICD-10-CM | POA: Diagnosis not present

## 2022-02-22 DIAGNOSIS — C3412 Malignant neoplasm of upper lobe, left bronchus or lung: Secondary | ICD-10-CM | POA: Diagnosis not present

## 2022-02-22 DIAGNOSIS — C787 Secondary malignant neoplasm of liver and intrahepatic bile duct: Secondary | ICD-10-CM | POA: Diagnosis not present

## 2022-02-22 DIAGNOSIS — E43 Unspecified severe protein-calorie malnutrition: Secondary | ICD-10-CM | POA: Diagnosis not present

## 2022-02-22 DIAGNOSIS — E785 Hyperlipidemia, unspecified: Secondary | ICD-10-CM | POA: Diagnosis not present

## 2022-02-22 DIAGNOSIS — E1169 Type 2 diabetes mellitus with other specified complication: Secondary | ICD-10-CM | POA: Diagnosis not present

## 2022-02-22 DIAGNOSIS — G4733 Obstructive sleep apnea (adult) (pediatric): Secondary | ICD-10-CM | POA: Diagnosis not present

## 2022-02-23 DIAGNOSIS — G4733 Obstructive sleep apnea (adult) (pediatric): Secondary | ICD-10-CM | POA: Diagnosis not present

## 2022-02-23 DIAGNOSIS — E43 Unspecified severe protein-calorie malnutrition: Secondary | ICD-10-CM | POA: Diagnosis not present

## 2022-02-23 DIAGNOSIS — C3412 Malignant neoplasm of upper lobe, left bronchus or lung: Secondary | ICD-10-CM | POA: Diagnosis not present

## 2022-02-23 DIAGNOSIS — E1169 Type 2 diabetes mellitus with other specified complication: Secondary | ICD-10-CM | POA: Diagnosis not present

## 2022-02-23 DIAGNOSIS — E785 Hyperlipidemia, unspecified: Secondary | ICD-10-CM | POA: Diagnosis not present

## 2022-02-23 DIAGNOSIS — C787 Secondary malignant neoplasm of liver and intrahepatic bile duct: Secondary | ICD-10-CM | POA: Diagnosis not present

## 2022-02-24 DIAGNOSIS — C787 Secondary malignant neoplasm of liver and intrahepatic bile duct: Secondary | ICD-10-CM | POA: Diagnosis not present

## 2022-02-24 DIAGNOSIS — C3412 Malignant neoplasm of upper lobe, left bronchus or lung: Secondary | ICD-10-CM | POA: Diagnosis not present

## 2022-02-24 DIAGNOSIS — E1169 Type 2 diabetes mellitus with other specified complication: Secondary | ICD-10-CM | POA: Diagnosis not present

## 2022-02-24 DIAGNOSIS — E43 Unspecified severe protein-calorie malnutrition: Secondary | ICD-10-CM | POA: Diagnosis not present

## 2022-02-24 DIAGNOSIS — G4733 Obstructive sleep apnea (adult) (pediatric): Secondary | ICD-10-CM | POA: Diagnosis not present

## 2022-02-24 DIAGNOSIS — E785 Hyperlipidemia, unspecified: Secondary | ICD-10-CM | POA: Diagnosis not present

## 2022-03-06 DIAGNOSIS — C3412 Malignant neoplasm of upper lobe, left bronchus or lung: Secondary | ICD-10-CM | POA: Diagnosis not present

## 2022-03-06 DIAGNOSIS — D630 Anemia in neoplastic disease: Secondary | ICD-10-CM | POA: Diagnosis not present

## 2022-03-06 DIAGNOSIS — C787 Secondary malignant neoplasm of liver and intrahepatic bile duct: Secondary | ICD-10-CM | POA: Diagnosis not present

## 2022-03-06 DIAGNOSIS — I110 Hypertensive heart disease with heart failure: Secondary | ICD-10-CM | POA: Diagnosis not present

## 2022-03-19 DEATH — deceased

## 2022-04-11 ENCOUNTER — Telehealth: Payer: Self-pay | Admitting: *Deleted

## 2022-04-11 NOTE — Telephone Encounter (Signed)
CALLED PATIENT TO ASK ABOUT RESCHEDULING MISSED SCAN, LVM FOR A RETURN CALL

## 2022-04-17 ENCOUNTER — Telehealth: Payer: Self-pay | Admitting: *Deleted

## 2022-04-17 NOTE — Telephone Encounter (Signed)
Called patient several times to reschedule missed scan and fu, lvm for a return calls

## 2022-06-27 ENCOUNTER — Telehealth: Payer: Self-pay | Admitting: *Deleted

## 2022-06-27 NOTE — Telephone Encounter (Signed)
CALLED PATIENT'S WIFE- BETTY TO ASK HER TO HAVE HER HUSBAND CALL ME, SO THAT I CAN GET HIS IN-PUT REGARDING RESCHEDULING OF HIS SCAN, LVM FOR A RETURN CALL

## 2022-07-26 NOTE — Patient Instructions (Incomplete)
John [name] , Thank you for taking time to come for your Medicare Wellness Visit. I appreciate your ongoing commitment to your health goals. Please review the following plan we discussed and let me know if I can assist you in the future.   These are the goals we discussed:  Goals      Exercise 3x per week (30 min per time)     Increase as tolerated.         This is a list of the screening recommended for you and due dates:  Health Maintenance  Topic Date Due   Yearly kidney health urinalysis for diabetes  Never done   Hepatitis C Screening: USPSTF Recommendation to screen - Ages 76-79 yo.  Never done   Zoster (Shingles) Vaccine (1 of 2) Never done   Colon Cancer Screening  Never done   Eye exam for diabetics  10/28/2021   Complete foot exam   12/22/2021   Flu Shot  01/17/2022   COVID-19 Vaccine (4 - 2023-24 season) 02/17/2022   Hemoglobin A1C  06/25/2022   Medicare Annual Wellness Visit  07/21/2022   DTaP/Tdap/Td vaccine (2 - Td or Tdap) 12/30/2022   Yearly kidney function blood test for diabetes  01/31/2023   Pneumonia Vaccine  Completed   HPV Vaccine  Aged Out    Advanced directives: We have a copy of your advanced directives available in your record should your provider ever need to access them.   Conditions/risks identified: Aim for 30 minutes of exercise or brisk walking, 6-8 glasses of water, and 5 servings of fruits and vegetables each day.   Next appointment: Follow up in one year for your annual wellness visit.   Preventive Care 8 Years and Older, Male  Preventive care refers to lifestyle choices and visits with your health care provider that can promote health and wellness. What does preventive care include? A yearly physical exam. This is also called an annual well check. Dental exams once or twice a year. Routine eye exams. Ask your health care provider how often you should have your eyes checked. Personal lifestyle choices, including: Daily care of your  teeth and gums. Regular physical activity. Eating a healthy diet. Avoiding tobacco and drug use. Limiting alcohol use. Practicing safe sex. Taking low doses of aspirin every day. Taking vitamin and mineral supplements as recommended by your health care provider. What happens during an annual well check? The services and screenings done by your health care provider during your annual well check will depend on your age, overall health, lifestyle risk factors, and family history of disease. Counseling  Your health care provider may ask you questions about your: Alcohol use. Tobacco use. Drug use. Emotional well-being. Home and relationship well-being. Sexual activity. Eating habits. History of falls. Memory and ability to understand (cognition). Work and work Statistician. Screening  You may have the following tests or measurements: Height, weight, and BMI. Blood pressure. Lipid and cholesterol levels. These may be checked every 5 years, or more frequently if you are over 51 years old. Skin check. Lung cancer screening. You may have this screening every year starting at age 71 if you have a 30-pack-year history of smoking and currently smoke or have quit within the past 15 years. Fecal occult blood test (FOBT) of the stool. You may have this test every year starting at age 33. Flexible sigmoidoscopy or colonoscopy. You may have a sigmoidoscopy every 5 years or a colonoscopy every 10 years starting at age 46. Prostate  cancer screening. Recommendations will vary depending on your family history and other risks. Hepatitis C blood test. Hepatitis B blood test. Sexually transmitted disease (STD) testing. Diabetes screening. This is done by checking your blood sugar (glucose) after you have not eaten for a while (fasting). You may have this done every 1-3 years. Abdominal aortic aneurysm (AAA) screening. You may need this if you are a current or former smoker. Osteoporosis. You may be  screened starting at age 51 if you are at high risk. Talk with your health care provider about your test results, treatment options, and if necessary, the need for more tests. Vaccines  Your health care provider may recommend certain vaccines, such as: Influenza vaccine. This is recommended every year. Tetanus, diphtheria, and acellular pertussis (Tdap, Td) vaccine. You may need a Td booster every 10 years. Zoster vaccine. You may need this after age 15. Pneumococcal 13-valent conjugate (PCV13) vaccine. One dose is recommended after age 69. Pneumococcal polysaccharide (PPSV23) vaccine. One dose is recommended after age 33. Talk to your health care provider about which screenings and vaccines you need and how often you need them. This information is not intended to replace advice given to you by your health care provider. Make sure you discuss any questions you have with your health care provider. Document Released: 07/02/2015 Document Revised: 02/23/2016 Document Reviewed: 04/06/2015 Elsevier Interactive Patient Education  2017 [employer] Prevention in the Home Falls can cause injuries. They can happen to people of all ages. There are many things you can do to make your home safe and to help prevent falls. What can I do on the outside of my home? Regularly fix the edges of walkways and driveways and fix any cracks. Remove anything that might make you trip as you walk through a door, such as a raised step or threshold. Trim any bushes or trees on the path to your home. Use bright outdoor lighting. Clear any walking paths of anything that might make someone trip, such as rocks or tools. Regularly check to see if handrails are loose or broken. Make sure that both sides of any steps have handrails. Any raised decks and porches should have guardrails on the edges. Have any leaves, snow, or ice cleared regularly. Use sand or salt on walking paths during winter. Clean up any spills in  your garage right away. This includes oil or grease spills. What can I do in the bathroom? Use night lights. Install grab bars by the toilet and in the tub and shower. Do not use towel bars as grab bars. Use non-skid mats or decals in the tub or shower. If you need to sit down in the shower, use a plastic, non-slip stool. Keep the floor dry. Clean up any water that spills on the floor as soon as it happens. Remove soap buildup in the tub or shower regularly. Attach bath mats securely with double-sided non-slip rug tape. Do not have throw rugs and other things on the floor that can make you trip. What can I do in the bedroom? Use night lights. Make sure that you have a light by your bed that is easy to reach. Do not use any sheets or blankets that are too big for your bed. They should not hang down onto the floor. Have a firm chair that has side arms. You can use this for support while you get dressed. Do not have throw rugs and other things on the floor that can make you trip. What  can I do in the kitchen? Clean up any spills right away. Avoid walking on wet floors. Keep items that you use a lot in easy-to-reach places. If you need to reach something above you, use a strong step stool that has a grab bar. Keep electrical cords out of the way. Do not use floor polish or wax that makes floors slippery. If you must use wax, use non-skid floor wax. Do not have throw rugs and other things on the floor that can make you trip. What can I do with my stairs? Do not leave any items on the stairs. Make sure that there are handrails on both sides of the stairs and use them. Fix handrails that are broken or loose. Make sure that handrails are as long as the stairways. Check any carpeting to make sure that it is firmly attached to the stairs. Fix any carpet that is loose or worn. Avoid having throw rugs at the top or bottom of the stairs. If you do have throw rugs, attach them to the floor with carpet  tape. Make sure that you have a light switch at the top of the stairs and the bottom of the stairs. If you do not have them, ask someone to add them for you. What else can I do to help prevent falls? Wear shoes that: Do not have high heels. Have rubber bottoms. Are comfortable and fit you well. Are closed at the toe. Do not wear sandals. If you use a stepladder: Make sure that it is fully opened. Do not climb a closed stepladder. Make sure that both sides of the stepladder are locked into place. Ask someone to hold it for you, if possible. Clearly mark and make sure that you can see: Any grab bars or handrails. First and last steps. Where the edge of each step is. Use tools that help you move around (mobility aids) if they are needed. These include: Canes. Walkers. Scooters. Crutches. Turn on the lights when you go into a dark area. Replace any light bulbs as soon as they burn out. Set up your furniture so you have a clear path. Avoid moving your furniture around. If any of your floors are uneven, fix them. If there are any pets around you, be aware of where they are. Review your medicines with your doctor. Some medicines can make you feel dizzy. This can increase your chance of falling. Ask your doctor what other things that you can do to help prevent falls. This information is not intended to replace advice given to you by your health care provider. Make sure you discuss any questions you have with your health care provider. Document Released: 04/01/2009 Document Revised: 11/11/2015 Document Reviewed: 07/10/2014 Elsevier Interactive Patient Education  2017 [employer].
